# Patient Record
Sex: Female | Born: 1935 | Race: Black or African American | Hispanic: No | Smoking: Former smoker
Health system: Southern US, Community
[De-identification: ages and names within clinical notes are randomized; demographics above are authoritative.]

## PROBLEM LIST (undated history)

## (undated) DIAGNOSIS — N390 Urinary tract infection, site not specified: Secondary | ICD-10-CM

## (undated) DIAGNOSIS — Z8719 Personal history of other diseases of the digestive system: Secondary | ICD-10-CM

## (undated) DIAGNOSIS — I10 Essential (primary) hypertension: Secondary | ICD-10-CM

## (undated) DIAGNOSIS — F419 Anxiety disorder, unspecified: Secondary | ICD-10-CM

## (undated) DIAGNOSIS — K589 Irritable bowel syndrome without diarrhea: Secondary | ICD-10-CM

## (undated) DIAGNOSIS — K219 Gastro-esophageal reflux disease without esophagitis: Secondary | ICD-10-CM

## (undated) DIAGNOSIS — R0789 Other chest pain: Secondary | ICD-10-CM

## (undated) DIAGNOSIS — E785 Hyperlipidemia, unspecified: Secondary | ICD-10-CM

## (undated) DIAGNOSIS — E119 Type 2 diabetes mellitus without complications: Secondary | ICD-10-CM

## (undated) DIAGNOSIS — R059 Cough, unspecified: Secondary | ICD-10-CM

## (undated) DIAGNOSIS — I5032 Chronic diastolic (congestive) heart failure: Secondary | ICD-10-CM

## (undated) DIAGNOSIS — D509 Iron deficiency anemia, unspecified: Secondary | ICD-10-CM

## (undated) DIAGNOSIS — N289 Disorder of kidney and ureter, unspecified: Secondary | ICD-10-CM

## (undated) DIAGNOSIS — M810 Age-related osteoporosis without current pathological fracture: Secondary | ICD-10-CM

## (undated) DIAGNOSIS — R05 Cough: Secondary | ICD-10-CM

## (undated) HISTORY — DX: Iron deficiency anemia, unspecified: D50.9

## (undated) HISTORY — DX: Type 2 diabetes mellitus without complications: E11.9

## (undated) HISTORY — DX: Personal history of other diseases of the digestive system: Z87.19

## (undated) HISTORY — DX: Gastro-esophageal reflux disease without esophagitis: K21.9

## (undated) HISTORY — DX: Essential (primary) hypertension: I10

## (undated) HISTORY — DX: Chronic diastolic (congestive) heart failure: I50.32

## (undated) HISTORY — DX: Age-related osteoporosis without current pathological fracture: M81.0

## (undated) HISTORY — DX: Irritable bowel syndrome, unspecified: K58.9

## (undated) HISTORY — DX: Anxiety disorder, unspecified: F41.9

## (undated) HISTORY — DX: Cough: R05

## (undated) HISTORY — DX: Urinary tract infection, site not specified: N39.0

## (undated) HISTORY — DX: Hyperlipidemia, unspecified: E78.5

## (undated) HISTORY — DX: Cough, unspecified: R05.9

## (undated) HISTORY — DX: Other chest pain: R07.89

## (undated) HISTORY — DX: Disorder of kidney and ureter, unspecified: N28.9

---

## 1997-03-20 LAB — HM COLONOSCOPY

## 1999-08-28 ENCOUNTER — Emergency Department (HOSPITAL_COMMUNITY): Admission: EM | Admit: 1999-08-28 | Discharge: 1999-08-28 | Payer: Self-pay | Admitting: Emergency Medicine

## 1999-08-28 ENCOUNTER — Encounter: Payer: Self-pay | Admitting: Emergency Medicine

## 2000-09-18 ENCOUNTER — Encounter: Payer: Self-pay | Admitting: Endocrinology

## 2000-09-18 ENCOUNTER — Encounter: Payer: Self-pay | Admitting: Emergency Medicine

## 2000-09-18 ENCOUNTER — Inpatient Hospital Stay (HOSPITAL_COMMUNITY): Admission: EM | Admit: 2000-09-18 | Discharge: 2000-09-20 | Payer: Self-pay | Admitting: Emergency Medicine

## 2000-09-19 ENCOUNTER — Encounter: Payer: Self-pay | Admitting: Endocrinology

## 2001-04-25 ENCOUNTER — Emergency Department (HOSPITAL_COMMUNITY): Admission: EM | Admit: 2001-04-25 | Discharge: 2001-04-25 | Payer: Self-pay | Admitting: Emergency Medicine

## 2001-04-27 ENCOUNTER — Encounter: Payer: Self-pay | Admitting: Internal Medicine

## 2001-04-27 ENCOUNTER — Inpatient Hospital Stay (HOSPITAL_COMMUNITY): Admission: EM | Admit: 2001-04-27 | Discharge: 2001-05-06 | Payer: Self-pay | Admitting: Internal Medicine

## 2001-04-29 ENCOUNTER — Encounter: Payer: Self-pay | Admitting: Internal Medicine

## 2001-04-30 ENCOUNTER — Encounter: Payer: Self-pay | Admitting: Internal Medicine

## 2001-05-01 ENCOUNTER — Encounter: Payer: Self-pay | Admitting: Internal Medicine

## 2001-06-14 ENCOUNTER — Encounter (INDEPENDENT_AMBULATORY_CARE_PROVIDER_SITE_OTHER): Payer: Self-pay

## 2001-06-14 ENCOUNTER — Ambulatory Visit (HOSPITAL_COMMUNITY): Admission: RE | Admit: 2001-06-14 | Discharge: 2001-06-14 | Payer: Self-pay | Admitting: Gastroenterology

## 2001-12-26 ENCOUNTER — Encounter: Payer: Self-pay | Admitting: Gastroenterology

## 2001-12-26 ENCOUNTER — Ambulatory Visit (HOSPITAL_COMMUNITY): Admission: RE | Admit: 2001-12-26 | Discharge: 2001-12-26 | Payer: Self-pay | Admitting: Gastroenterology

## 2001-12-26 HISTORY — PX: ESOPHAGOGASTRODUODENOSCOPY: SHX1529

## 2002-02-07 ENCOUNTER — Ambulatory Visit (HOSPITAL_COMMUNITY): Admission: RE | Admit: 2002-02-07 | Discharge: 2002-02-07 | Payer: Self-pay | Admitting: Endocrinology

## 2002-02-07 ENCOUNTER — Encounter: Payer: Self-pay | Admitting: Endocrinology

## 2002-03-25 ENCOUNTER — Other Ambulatory Visit: Admission: RE | Admit: 2002-03-25 | Discharge: 2002-03-25 | Payer: Self-pay | Admitting: Family Medicine

## 2002-06-19 ENCOUNTER — Encounter: Admission: RE | Admit: 2002-06-19 | Discharge: 2002-06-19 | Payer: Self-pay | Admitting: Endocrinology

## 2002-06-19 ENCOUNTER — Encounter: Payer: Self-pay | Admitting: Endocrinology

## 2002-07-07 ENCOUNTER — Encounter: Admission: RE | Admit: 2002-07-07 | Discharge: 2002-10-05 | Payer: Self-pay | Admitting: Orthopedic Surgery

## 2002-08-15 ENCOUNTER — Encounter (INDEPENDENT_AMBULATORY_CARE_PROVIDER_SITE_OTHER): Payer: Self-pay | Admitting: Specialist

## 2002-08-15 ENCOUNTER — Ambulatory Visit (HOSPITAL_COMMUNITY): Admission: RE | Admit: 2002-08-15 | Discharge: 2002-08-15 | Payer: Self-pay | Admitting: Gastroenterology

## 2002-10-06 ENCOUNTER — Encounter: Admission: RE | Admit: 2002-10-06 | Discharge: 2002-11-07 | Payer: Self-pay | Admitting: Orthopedic Surgery

## 2004-02-25 HISTORY — PX: CARDIOVASCULAR STRESS TEST: SHX262

## 2004-12-14 ENCOUNTER — Ambulatory Visit: Payer: Self-pay | Admitting: Endocrinology

## 2005-02-02 ENCOUNTER — Ambulatory Visit: Payer: Self-pay | Admitting: Endocrinology

## 2005-03-16 ENCOUNTER — Ambulatory Visit: Payer: Self-pay | Admitting: Endocrinology

## 2005-06-16 ENCOUNTER — Ambulatory Visit: Payer: Self-pay | Admitting: Endocrinology

## 2005-08-03 ENCOUNTER — Ambulatory Visit: Payer: Self-pay | Admitting: Endocrinology

## 2006-07-13 ENCOUNTER — Ambulatory Visit: Payer: Self-pay | Admitting: Endocrinology

## 2006-07-31 ENCOUNTER — Ambulatory Visit: Payer: Self-pay | Admitting: Endocrinology

## 2006-08-07 ENCOUNTER — Ambulatory Visit: Payer: Self-pay | Admitting: Endocrinology

## 2006-08-07 LAB — CONVERTED CEMR LAB
Alkaline Phosphatase: 63 units/L (ref 39–117)
Bilirubin, Direct: 0.1 mg/dL (ref 0.0–0.3)
Cholesterol: 163 mg/dL (ref 0–200)
HDL: 38.4 mg/dL — ABNORMAL LOW (ref >39.0)
Microalb Creat Ratio: 1.5 mg/g (ref 0.0–30.0)
Microalb, Ur: 0.2 mg/dL (ref 0.0–1.9)
Triglyceride fasting, serum: 83 mg/dL (ref 0–149)
VLDL: 17 mg/dL (ref 0–40)

## 2007-04-02 LAB — CONVERTED CEMR LAB: Pap Smear: NORMAL

## 2007-05-27 ENCOUNTER — Encounter: Payer: Self-pay | Admitting: Endocrinology

## 2007-05-27 DIAGNOSIS — J302 Other seasonal allergic rhinitis: Secondary | ICD-10-CM

## 2007-05-27 DIAGNOSIS — M810 Age-related osteoporosis without current pathological fracture: Secondary | ICD-10-CM

## 2007-05-27 DIAGNOSIS — Z8719 Personal history of other diseases of the digestive system: Secondary | ICD-10-CM

## 2007-05-27 DIAGNOSIS — I10 Essential (primary) hypertension: Secondary | ICD-10-CM

## 2007-05-27 DIAGNOSIS — D509 Iron deficiency anemia, unspecified: Secondary | ICD-10-CM

## 2007-05-27 DIAGNOSIS — J45991 Cough variant asthma: Secondary | ICD-10-CM

## 2007-05-27 DIAGNOSIS — F411 Generalized anxiety disorder: Secondary | ICD-10-CM | POA: Insufficient documentation

## 2007-05-27 DIAGNOSIS — E785 Hyperlipidemia, unspecified: Secondary | ICD-10-CM

## 2007-06-04 ENCOUNTER — Ambulatory Visit: Payer: Self-pay | Admitting: Endocrinology

## 2007-07-12 ENCOUNTER — Ambulatory Visit: Payer: Self-pay | Admitting: Endocrinology

## 2007-07-30 ENCOUNTER — Ambulatory Visit: Payer: Self-pay | Admitting: Endocrinology

## 2007-07-30 LAB — CONVERTED CEMR LAB
ALT: 17 units/L (ref 0–35)
AST: 17 units/L (ref 0–37)
Albumin: 3.9 g/dL (ref 3.5–5.2)
Alkaline Phosphatase: 66 units/L (ref 39–117)
Basophils Absolute: 0 10*3/uL (ref 0.0–0.1)
Basophils Relative: 0.2 % (ref 0.0–1.0)
Bilirubin, Direct: 0.1 mg/dL (ref 0.0–0.3)
Calcium: 9.8 mg/dL (ref 8.4–10.5)
Creatinine,U: 101.7 mg/dL
Eosinophils Absolute: 0.3 10*3/uL (ref 0.0–0.6)
Glucose, Bld: 115 mg/dL — ABNORMAL HIGH (ref 70–99)
Hemoglobin, Urine: NEGATIVE
Hemoglobin: 11.1 g/dL — ABNORMAL LOW (ref 12.0–15.0)
Hgb A1c MFr Bld: 4.8 % (ref 4.6–6.0)
Ketones, ur: NEGATIVE mg/dL
Microalb, Ur: 0.3 mg/dL (ref 0.0–1.9)
Monocytes Relative: 7.5 % (ref 3.0–11.0)
Mucus, UA: NEGATIVE
Neutrophils Relative %: 54.4 % (ref 43.0–77.0)
Platelets: 177 10*3/uL (ref 150–400)
RBC / HPF: NONE SEEN
RBC: 4.12 M/uL (ref 3.87–5.11)
Sodium: 148 meq/L — ABNORMAL HIGH (ref 135–145)
Specific Gravity, Urine: <=1.005 (ref 1.000–1.03)
TSH: 1.59 microintl units/mL (ref 0.35–5.50)
Total Bilirubin: 0.8 mg/dL (ref 0.3–1.2)
Total Protein, Urine: NEGATIVE mg/dL
Total Protein: 7.3 g/dL (ref 6.0–8.3)
Triglycerides: 126 mg/dL (ref 0–149)
Urine Glucose: NEGATIVE mg/dL
VLDL: 25 mg/dL (ref 0–40)
WBC: 6.7 10*3/uL (ref 4.5–10.5)

## 2007-09-28 ENCOUNTER — Ambulatory Visit: Payer: Self-pay | Admitting: Family Medicine

## 2007-09-28 DIAGNOSIS — N390 Urinary tract infection, site not specified: Secondary | ICD-10-CM

## 2007-09-28 LAB — CONVERTED CEMR LAB
Blood in Urine, dipstick: NEGATIVE
Ketones, urine, test strip: NEGATIVE
Nitrite: NEGATIVE
Specific Gravity, Urine: <1.005
pH: 6

## 2007-09-29 ENCOUNTER — Ambulatory Visit: Payer: Self-pay | Admitting: Internal Medicine

## 2007-09-29 ENCOUNTER — Inpatient Hospital Stay (HOSPITAL_COMMUNITY): Admission: EM | Admit: 2007-09-29 | Discharge: 2007-10-03 | Payer: Self-pay | Admitting: Emergency Medicine

## 2007-10-15 ENCOUNTER — Ambulatory Visit: Payer: Self-pay | Admitting: Endocrinology

## 2007-11-06 ENCOUNTER — Encounter: Payer: Self-pay | Admitting: Endocrinology

## 2007-11-12 ENCOUNTER — Ambulatory Visit: Payer: Self-pay | Admitting: Endocrinology

## 2007-11-12 LAB — CONVERTED CEMR LAB: Hgb A1c MFr Bld: 5.8 % (ref 4.6–6.0)

## 2007-11-18 ENCOUNTER — Telehealth: Payer: Self-pay | Admitting: Endocrinology

## 2007-11-22 ENCOUNTER — Ambulatory Visit: Payer: Self-pay | Admitting: Endocrinology

## 2007-11-22 DIAGNOSIS — R059 Cough, unspecified: Secondary | ICD-10-CM | POA: Insufficient documentation

## 2007-11-22 DIAGNOSIS — R05 Cough: Secondary | ICD-10-CM | POA: Insufficient documentation

## 2007-11-26 ENCOUNTER — Telehealth (INDEPENDENT_AMBULATORY_CARE_PROVIDER_SITE_OTHER): Payer: Self-pay | Admitting: *Deleted

## 2007-11-27 ENCOUNTER — Ambulatory Visit: Payer: Self-pay | Admitting: Endocrinology

## 2007-11-28 ENCOUNTER — Telehealth (INDEPENDENT_AMBULATORY_CARE_PROVIDER_SITE_OTHER): Payer: Self-pay | Admitting: *Deleted

## 2007-11-29 ENCOUNTER — Encounter: Payer: Self-pay | Admitting: Endocrinology

## 2007-12-04 ENCOUNTER — Telehealth: Payer: Self-pay | Admitting: Endocrinology

## 2007-12-05 ENCOUNTER — Ambulatory Visit: Payer: Self-pay | Admitting: Endocrinology

## 2007-12-12 ENCOUNTER — Ambulatory Visit: Payer: Self-pay | Admitting: Endocrinology

## 2007-12-18 ENCOUNTER — Encounter: Payer: Self-pay | Admitting: Endocrinology

## 2007-12-18 ENCOUNTER — Ambulatory Visit: Payer: Self-pay

## 2007-12-24 ENCOUNTER — Telehealth (INDEPENDENT_AMBULATORY_CARE_PROVIDER_SITE_OTHER): Payer: Self-pay | Admitting: *Deleted

## 2008-01-02 ENCOUNTER — Telehealth (INDEPENDENT_AMBULATORY_CARE_PROVIDER_SITE_OTHER): Payer: Self-pay | Admitting: *Deleted

## 2008-01-06 ENCOUNTER — Ambulatory Visit: Payer: Self-pay | Admitting: Internal Medicine

## 2008-01-07 ENCOUNTER — Telehealth: Payer: Self-pay | Admitting: Endocrinology

## 2008-01-13 ENCOUNTER — Ambulatory Visit: Payer: Self-pay | Admitting: Internal Medicine

## 2008-01-13 DIAGNOSIS — K219 Gastro-esophageal reflux disease without esophagitis: Secondary | ICD-10-CM | POA: Insufficient documentation

## 2008-01-22 ENCOUNTER — Telehealth (INDEPENDENT_AMBULATORY_CARE_PROVIDER_SITE_OTHER): Payer: Self-pay | Admitting: *Deleted

## 2008-01-27 ENCOUNTER — Ambulatory Visit: Payer: Self-pay | Admitting: Endocrinology

## 2008-01-27 DIAGNOSIS — R928 Other abnormal and inconclusive findings on diagnostic imaging of breast: Secondary | ICD-10-CM | POA: Insufficient documentation

## 2008-01-27 DIAGNOSIS — K5792 Diverticulitis of intestine, part unspecified, without perforation or abscess without bleeding: Secondary | ICD-10-CM

## 2008-01-27 DIAGNOSIS — K649 Unspecified hemorrhoids: Secondary | ICD-10-CM | POA: Insufficient documentation

## 2008-01-28 LAB — CONVERTED CEMR LAB
AST: 23 units/L (ref 0–37)
Amylase: 143 units/L — ABNORMAL HIGH (ref 27–131)
Basophils Absolute: 0 10*3/uL (ref 0.0–0.1)
Bilirubin, Direct: 0.1 mg/dL (ref 0.0–0.3)
CO2: 28 meq/L (ref 19–32)
Calcium: 8.9 mg/dL (ref 8.4–10.5)
Chloride: 107 meq/L (ref 96–112)
GFR calc Af Amer: 63 mL/min
GFR calc non Af Amer: 52 mL/min
Glucose, Bld: 111 mg/dL — ABNORMAL HIGH (ref 70–99)
Ketones, ur: NEGATIVE mg/dL
MCV: 82.6 fL (ref 78.0–100.0)
Monocytes Absolute: 0.5 10*3/uL (ref 0.1–1.0)
Monocytes Relative: 8.1 % (ref 3.0–12.0)
Mucus, UA: NEGATIVE
Neutro Abs: 3.8 10*3/uL (ref 1.4–7.7)
Potassium: 3.4 meq/L — ABNORMAL LOW (ref 3.5–5.1)
RBC: 3.94 M/uL (ref 3.87–5.11)
RDW: 16.5 % — ABNORMAL HIGH (ref 11.5–14.6)
Sodium: 145 meq/L (ref 135–145)
Specific Gravity, Urine: 1.01 (ref 1.000–1.03)
Urine Glucose: NEGATIVE mg/dL
WBC, UA: NONE SEEN cells/hpf
pH: 6.5 (ref 5.0–8.0)

## 2008-01-30 ENCOUNTER — Ambulatory Visit: Payer: Self-pay | Admitting: Internal Medicine

## 2008-02-03 ENCOUNTER — Telehealth: Payer: Self-pay | Admitting: Endocrinology

## 2008-02-07 ENCOUNTER — Encounter: Admission: RE | Admit: 2008-02-07 | Discharge: 2008-02-07 | Payer: Self-pay | Admitting: Endocrinology

## 2008-02-12 ENCOUNTER — Encounter: Payer: Self-pay | Admitting: Endocrinology

## 2008-02-13 ENCOUNTER — Ambulatory Visit: Payer: Self-pay | Admitting: Internal Medicine

## 2008-03-18 ENCOUNTER — Ambulatory Visit: Payer: Self-pay | Admitting: Internal Medicine

## 2008-03-24 ENCOUNTER — Encounter: Payer: Self-pay | Admitting: Endocrinology

## 2008-03-31 ENCOUNTER — Telehealth (INDEPENDENT_AMBULATORY_CARE_PROVIDER_SITE_OTHER): Payer: Self-pay | Admitting: *Deleted

## 2008-04-02 ENCOUNTER — Encounter: Payer: Self-pay | Admitting: Endocrinology

## 2008-04-17 ENCOUNTER — Ambulatory Visit: Payer: Self-pay | Admitting: Internal Medicine

## 2008-04-20 ENCOUNTER — Encounter: Payer: Self-pay | Admitting: Endocrinology

## 2008-07-01 ENCOUNTER — Ambulatory Visit: Payer: Self-pay | Admitting: Internal Medicine

## 2008-07-10 ENCOUNTER — Ambulatory Visit: Payer: Self-pay | Admitting: Endocrinology

## 2008-07-13 ENCOUNTER — Telehealth (INDEPENDENT_AMBULATORY_CARE_PROVIDER_SITE_OTHER): Payer: Self-pay | Admitting: *Deleted

## 2008-07-13 LAB — CONVERTED CEMR LAB
Eosinophils Absolute: 0.2 10*3/uL (ref 0.0–0.7)
Eosinophils Relative: 2.8 % (ref 0.0–5.0)
HCT: 37.1 % (ref 36.0–46.0)
Hemoglobin: 12.6 g/dL (ref 12.0–15.0)
Lymphocytes Relative: 21.5 % (ref 12.0–46.0)
MCHC: 33.9 g/dL (ref 30.0–36.0)
Monocytes Absolute: 0.7 10*3/uL (ref 0.1–1.0)
Neutrophils Relative %: 67.2 % (ref 43.0–77.0)
Platelets: 209 10*3/uL (ref 150–400)
RBC: 4.52 M/uL (ref 3.87–5.11)
RDW: 16.3 % — ABNORMAL HIGH (ref 11.5–14.6)

## 2008-07-14 ENCOUNTER — Encounter: Payer: Self-pay | Admitting: Endocrinology

## 2008-07-15 ENCOUNTER — Encounter: Admission: RE | Admit: 2008-07-15 | Discharge: 2008-07-15 | Payer: Self-pay | Admitting: Gastroenterology

## 2008-07-22 ENCOUNTER — Ambulatory Visit: Payer: Self-pay | Admitting: Endocrinology

## 2008-07-22 DIAGNOSIS — R1909 Other intra-abdominal and pelvic swelling, mass and lump: Secondary | ICD-10-CM

## 2008-08-13 ENCOUNTER — Ambulatory Visit: Payer: Self-pay | Admitting: Internal Medicine

## 2008-08-14 ENCOUNTER — Ambulatory Visit: Admission: RE | Admit: 2008-08-14 | Discharge: 2008-08-14 | Payer: Self-pay | Admitting: Gynecology

## 2008-08-28 ENCOUNTER — Telehealth: Payer: Self-pay | Admitting: Endocrinology

## 2008-09-01 ENCOUNTER — Ambulatory Visit (HOSPITAL_COMMUNITY): Admission: RE | Admit: 2008-09-01 | Discharge: 2008-09-01 | Payer: Self-pay | Admitting: Gynecologic Oncology

## 2008-09-01 ENCOUNTER — Encounter (INDEPENDENT_AMBULATORY_CARE_PROVIDER_SITE_OTHER): Payer: Self-pay | Admitting: Gynecologic Oncology

## 2008-10-02 HISTORY — PX: RIGHT OOPHORECTOMY: SHX2359

## 2008-10-05 ENCOUNTER — Telehealth: Payer: Self-pay | Admitting: Endocrinology

## 2008-10-08 ENCOUNTER — Ambulatory Visit: Admission: RE | Admit: 2008-10-08 | Discharge: 2008-10-08 | Payer: Self-pay | Admitting: Gynecologic Oncology

## 2008-12-09 ENCOUNTER — Ambulatory Visit: Payer: Self-pay | Admitting: Endocrinology

## 2009-01-20 ENCOUNTER — Ambulatory Visit: Payer: Self-pay | Admitting: Endocrinology

## 2009-01-20 LAB — CONVERTED CEMR LAB
ALT: 17 units/L (ref 0–35)
Basophils Absolute: 0.1 10*3/uL (ref 0.0–0.1)
Basophils Relative: 0.9 % (ref 0.0–3.0)
Bilirubin Urine: NEGATIVE
Creatinine, Ser: 1.2 mg/dL (ref 0.4–1.2)
Creatinine,U: 136.7 mg/dL
Eosinophils Absolute: 0.3 10*3/uL (ref 0.0–0.7)
HDL: 38.3 mg/dL — ABNORMAL LOW (ref >39.00)
Hemoglobin, Urine: NEGATIVE
Hgb A1c MFr Bld: 5.5 % (ref 4.6–6.5)
Iron: 72 ug/dL (ref 42–145)
Ketones, ur: NEGATIVE mg/dL
Leukocytes, UA: NEGATIVE
Lymphs Abs: 1.8 10*3/uL (ref 0.7–4.0)
MCHC: 33.1 g/dL (ref 30.0–36.0)
MCV: 81.3 fL (ref 78.0–100.0)
Microalb Creat Ratio: 1.5 mg/g (ref 0.0–30.0)
Microalb, Ur: 0.2 mg/dL (ref 0.0–1.9)
Neutro Abs: 3.3 10*3/uL (ref 1.4–7.7)
Nitrite: NEGATIVE
Platelets: 163 10*3/uL (ref 150.0–400.0)
RBC: 3.85 M/uL — ABNORMAL LOW (ref 3.87–5.11)
Specific Gravity, Urine: 1.01 (ref 1.000–1.030)
TSH: 1.74 microintl units/mL (ref 0.35–5.50)
Transferrin: 253.5 mg/dL (ref 212.0–360.0)
Triglycerides: 128 mg/dL (ref 0.0–149.0)
Urine Glucose: NEGATIVE mg/dL
VLDL: 25.6 mg/dL (ref 0.0–40.0)
WBC: 6.1 10*3/uL (ref 4.5–10.5)
pH: 6 (ref 5.0–8.0)

## 2009-01-26 ENCOUNTER — Ambulatory Visit: Payer: Self-pay | Admitting: Internal Medicine

## 2009-01-26 ENCOUNTER — Encounter: Payer: Self-pay | Admitting: Endocrinology

## 2009-03-11 ENCOUNTER — Telehealth: Payer: Self-pay | Admitting: Endocrinology

## 2009-03-11 ENCOUNTER — Ambulatory Visit: Payer: Self-pay | Admitting: Endocrinology

## 2009-03-11 ENCOUNTER — Encounter: Admission: RE | Admit: 2009-03-11 | Discharge: 2009-03-11 | Payer: Self-pay | Admitting: Endocrinology

## 2009-03-12 ENCOUNTER — Telehealth: Payer: Self-pay | Admitting: Endocrinology

## 2009-04-21 ENCOUNTER — Ambulatory Visit: Payer: Self-pay | Admitting: Internal Medicine

## 2009-04-26 LAB — CONVERTED CEMR LAB
Alkaline Phosphatase: 80 units/L (ref 39–117)
Bilirubin, Direct: 0.2 mg/dL (ref 0.0–0.3)
Cholesterol: 124 mg/dL (ref 0–200)
Hgb A1c MFr Bld: 5.6 % (ref 4.6–6.5)
LDL Cholesterol: 61 mg/dL (ref 0–99)
Total Bilirubin: 1 mg/dL (ref 0.3–1.2)
Total CHOL/HDL Ratio: 3
Total Protein: 7.3 g/dL (ref 6.0–8.3)

## 2009-04-30 ENCOUNTER — Telehealth (INDEPENDENT_AMBULATORY_CARE_PROVIDER_SITE_OTHER): Payer: Self-pay | Admitting: *Deleted

## 2009-05-03 ENCOUNTER — Telehealth (INDEPENDENT_AMBULATORY_CARE_PROVIDER_SITE_OTHER): Payer: Self-pay | Admitting: *Deleted

## 2009-05-07 ENCOUNTER — Ambulatory Visit: Payer: Self-pay | Admitting: Internal Medicine

## 2009-05-07 DIAGNOSIS — R079 Chest pain, unspecified: Secondary | ICD-10-CM

## 2009-05-07 DIAGNOSIS — K589 Irritable bowel syndrome without diarrhea: Secondary | ICD-10-CM

## 2009-05-11 ENCOUNTER — Ambulatory Visit: Payer: Self-pay | Admitting: Endocrinology

## 2009-05-28 ENCOUNTER — Encounter: Admission: RE | Admit: 2009-05-28 | Discharge: 2009-05-28 | Payer: Self-pay | Admitting: Endocrinology

## 2009-07-02 ENCOUNTER — Ambulatory Visit: Payer: Self-pay | Admitting: Endocrinology

## 2009-08-23 ENCOUNTER — Ambulatory Visit: Payer: Self-pay | Admitting: Endocrinology

## 2009-09-22 ENCOUNTER — Ambulatory Visit: Payer: Self-pay | Admitting: Endocrinology

## 2009-09-29 ENCOUNTER — Ambulatory Visit: Payer: Self-pay | Admitting: Endocrinology

## 2009-10-04 ENCOUNTER — Ambulatory Visit: Payer: Self-pay | Admitting: Endocrinology

## 2009-11-09 ENCOUNTER — Ambulatory Visit: Payer: Self-pay | Admitting: Endocrinology

## 2009-11-09 DIAGNOSIS — R930 Abnormal findings on diagnostic imaging of skull and head, not elsewhere classified: Secondary | ICD-10-CM

## 2009-11-23 ENCOUNTER — Ambulatory Visit: Payer: Self-pay | Admitting: Internal Medicine

## 2009-12-22 ENCOUNTER — Ambulatory Visit: Payer: Self-pay | Admitting: Endocrinology

## 2009-12-22 LAB — CONVERTED CEMR LAB
ALT: 20 units/L (ref 0–35)
Albumin: 3.9 g/dL (ref 3.5–5.2)
Bilirubin, Direct: 0.2 mg/dL (ref 0.0–0.3)
Cholesterol: 103 mg/dL (ref 0–200)
Creatinine,U: 85.4 mg/dL
Eosinophils Absolute: 0.4 10*3/uL (ref 0.0–0.7)
GFR calc non Af Amer: 56.85 mL/min (ref >60)
HCT: 35.5 % — ABNORMAL LOW (ref 36.0–46.0)
HDL: 38.6 mg/dL — ABNORMAL LOW (ref >39.00)
Hemoglobin: 11.3 g/dL — ABNORMAL LOW (ref 12.0–15.0)
Hgb A1c MFr Bld: 5.6 % (ref 4.6–6.5)
Iron: 74 ug/dL (ref 42–145)
Ketones, ur: NEGATIVE mg/dL
LDL Cholesterol: 40 mg/dL (ref 0–99)
Lymphs Abs: 1.5 10*3/uL (ref 0.7–4.0)
MCHC: 31.9 g/dL (ref 30.0–36.0)
MCV: 81.9 fL (ref 78.0–100.0)
Monocytes Absolute: 0.3 10*3/uL (ref 0.1–1.0)
Monocytes Relative: 6.2 % (ref 3.0–12.0)
Neutrophils Relative %: 56.9 % (ref 43.0–77.0)
Platelets: 144 10*3/uL — ABNORMAL LOW (ref 150.0–400.0)
Potassium: 3.7 meq/L (ref 3.5–5.1)
RDW: 16.1 % — ABNORMAL HIGH (ref 11.5–14.6)
Rhuematoid fact SerPl-aCnc: 21.5 intl units/mL — ABNORMAL HIGH (ref 0.0–20.0)
Saturation Ratios: 20.1 % (ref 20.0–50.0)
Sed Rate: 3 mm/hr (ref 0–22)
Sodium: 141 meq/L (ref 135–145)
Specific Gravity, Urine: 1.015 (ref 1.000–1.030)
TSH: 2.51 microintl units/mL (ref 0.35–5.50)
Total Bilirubin: 0.6 mg/dL (ref 0.3–1.2)
Total Protein, Urine: NEGATIVE mg/dL
Triglycerides: 120 mg/dL (ref 0.0–149.0)
Urine Glucose: NEGATIVE mg/dL
Urobilinogen, UA: 0.2 (ref 0.0–1.0)
WBC: 5.6 10*3/uL (ref 4.5–10.5)
pH: 6 (ref 5.0–8.0)

## 2009-12-24 ENCOUNTER — Telehealth: Payer: Self-pay | Admitting: Endocrinology

## 2010-01-04 ENCOUNTER — Ambulatory Visit: Payer: Self-pay | Admitting: Internal Medicine

## 2010-04-13 ENCOUNTER — Ambulatory Visit: Payer: Self-pay | Admitting: Endocrinology

## 2010-04-14 ENCOUNTER — Telehealth: Payer: Self-pay | Admitting: Endocrinology

## 2010-04-28 ENCOUNTER — Telehealth: Payer: Self-pay | Admitting: Endocrinology

## 2010-05-03 ENCOUNTER — Encounter: Payer: Self-pay | Admitting: Endocrinology

## 2010-06-14 ENCOUNTER — Encounter: Admission: RE | Admit: 2010-06-14 | Discharge: 2010-06-14 | Payer: Self-pay | Admitting: Endocrinology

## 2010-07-13 ENCOUNTER — Ambulatory Visit: Payer: Self-pay | Admitting: Endocrinology

## 2010-07-13 ENCOUNTER — Encounter: Payer: Self-pay | Admitting: Endocrinology

## 2010-07-13 LAB — CONVERTED CEMR LAB
Basophils Absolute: 0 10*3/uL (ref 0.0–0.1)
Eosinophils Absolute: 0.5 10*3/uL (ref 0.0–0.7)
Eosinophils Relative: 6.5 % — ABNORMAL HIGH (ref 0.0–5.0)
HCT: 33.8 % — ABNORMAL LOW (ref 36.0–46.0)
Hgb A1c MFr Bld: 6.5 % (ref 4.6–6.5)
Iron: 82 ug/dL (ref 42–145)
Lymphocytes Relative: 24.7 % (ref 12.0–46.0)
MCHC: 34.1 g/dL (ref 30.0–36.0)
Neutro Abs: 4.8 10*3/uL (ref 1.4–7.7)
RBC: 4.13 M/uL (ref 3.87–5.11)

## 2010-10-28 ENCOUNTER — Telehealth: Payer: Self-pay | Admitting: Endocrinology

## 2010-11-01 NOTE — Assessment & Plan Note (Signed)
Summary: Pulmonary/ f/u with pft's and ext summary discussion   Copy to:  Dr. Loanne Drilling Primary Provider/Referring Provider:  Loanne Drilling  CC:  6 wk followup with PFT's.  Pt states that her breathing has improved since last seen.  She still has occ SOB with exertion "if walks a long way".  She c/o "hacking cough" that she relates to having allergies..  History of Present Illness:  75 yobf quit smoking 1988  with h/o  asthma who had  been using Advair on a p.r.n. basis and noticing increasing dyspnea and need for rescue therapy x one mo when seen 01/06/08 for pulmonary evaluation so  Advair stopped. Began Symbicort 160/4.38mcg 2 puffs two times a day.   Returned 02/13/08  improved with decreased dyspnea and no rescue use   Returned  03/18/08 symptom free no rescue needed, stopped reglan, no flare  April 17, 2008 did not use am symbicort but no symptoms or need for rescue, asked her to use symbicort consistently  July 01, 2008 ov reports everything better until 9/28 increaese cough/ sneeze and watery discharge,   rx predisone 10  4 each am x 2days, 2x2days, 1x2days and stop   August 13, 2008 ov co all better, did not lose ground off predsione in terms of cough or sob.   November 23, 2009 Followup.  Pt states that she had PNA in Jan 2011 and since then has had increased SOB.  She states that SOB is only with exertion.  She gets SOB walking long distances.  She also c/o dry cough x several months.  not compliant with symbicort. rec symbicort 160 and work on techniqu  January 04, 2010  cc  her breathing has improved since last seen.  She still has occ SOB with exertion "if walks a long way".  She c/o "hacking cough" that she relates to having allergies tends to be worse in spring day > night mild itching sneezing runny nose.  Pt denies any significant sore throat, dysphagia, itching, sneezing,  fever, chills, sweats, unintended wt loss, pleuritic or exertional cp, hempoptysis, change in activity  tolerance  orthopnea pnd or leg swelling. Pt also denies any obvious fluctuation in symptoms with weather or environmental change or other alleviating or aggravating factors.    Pt denies any increase in rescue therapy over baseline, denies waking up needing it or having early am exacerbations of coughing/wheezing/ or dyspnea   Current Medications (verified): 1)  Symbicort 160-4.5 Mcg/act Aero (Budesonide-Formoterol Fumarate) .... 2 Puffs First Thing  in Am and 2 Puffs Again in Pm About 12 Hours Later 2)  Singulair 10 Mg  Tabs (Montelukast Sodium) .... Take 1 Tablet By Mouth Once A Day 3)  Norvasc 10 Mg  Tabs (Amlodipine Besylate) .... One Half Daily 4)  Novolog 100 Unit/ml  Soln (Insulin Aspart) .... Three Times A Day (Before Every Meal) 25-20-40 Units. 5)  Ventolin Hfa 108 (90 Base) Mcg/act  Aers (Albuterol Sulfate) .Marland Kitchen.. 1-2 Puffs Every 4-6 Hours As Needed For Increase Cough/wheeze or Short of Breath 6)  Albuterol Sulfate (2.5 Mg/34ml) 0.083%  Nebu (Albuterol Sulfate) .Marland Kitchen.. 1 Neb Every 6 Hours As Needed For Severe Short of Breath Not Responding To Inhaler With Albuterol 7)  Onetouch Lancets   Misc (Lancets) .... Use As Directed With Meter Three Times Daily, in The Am Before Meals and 2 Hours After Lunch and 2 Hours After Dinner 8)  Onetouch Test   Strp (Glucose Blood) .... Use One Test Strip Three Times Daily  9)  Bd Insulin Syringe Ultrafine 31g X 5/16" 0.3 Ml  Misc (Insulin Syringe-Needle U-100) .... Use As Directed 10)  Mucinex Dm 30-600 Mg Xr12h-Tab (Dextromethorphan-Guaifenesin) .Marland Kitchen.. 1-2 Every 12 Hours For Cough or Congestion 11)  Pravastatin Sodium 40 Mg Tabs (Pravastatin Sodium) .Marland Kitchen.. 1 Qhs 12)  Hyzaar 50-12.5 Mg Tabs (Losartan Potassium-Hctz) .Marland Kitchen.. 1 Qd 13)  Hydrocodone-Acetaminophen 5-325 Mg Tabs (Hydrocodone-Acetaminophen) .... 1/2-1 Every 4 Hrs As Needed For Pain  Allergies (verified): 1)  ! * Altace 2)  ! * Asprin  Past History:  Past Medical History: CHEST WALL PAIN, ANTERIOR  (ICD-786.52) COUGH (ICD-786.2) UTI (ICD-599.0) OSTEOPOROSIS (ICD-733.00) HYPERTENSION (ICD-401.9) HYPERLIPIDEMIA (ICD-272.4) DIVERTICULITIS, HX OF (ICD-V12.79) DIABETES MELLITUS, TYPE II (ICD-250.00) COLON CANCER, HX OF (ICD-V10.05) ASTHMA (ICD-493.90)   -PFT's 03/18/08 minimal airflow obstruction   -PFT's January 04, 2010 No sign airflow obstruction   -Mastered HFA technique August 13, 2008 > confimed November 23, 2009  ANXIETY (ICD-300.00) ANEMIA-IRON DEFICIENCY (ICD-280.9) ALLERGIC RHINITIS (ICD-477.9)  IBS GERD  Vital Signs:  Patient profile:   75 year old female Weight:      199 pounds O2 Sat:      98 % on Room air Temp:     98.3 degrees F oral Pulse rate:   76 / minute BP sitting:   120 / 64  (left arm) Cuff size:   large  Vitals Entered ByTilden Dome (January 04, 2010 9:46 AM)  O2 Flow:  Room air  Physical Exam  Additional Exam:  wt 198 > 202 November 23, 2009> 199 January 04, 2010  HEENT mild turbinate edema.  Oropharynx no thrush or excess pnd or cobblestoning.  No JVD or cervical adenopathy. Mild accessory muscle hypertrophy. Trachea midline, nl thryroid. Chest was hyperinflated by percussion with diminished breath sounds and moderate increased exp time without wheeze. Hoover sign positive at mid inspiration. Regular rate and rhythm without murmur gallop or rub or increase P2 or edema.  Abd: no hsm, nl excursion. Ext warm without cyanosis or clubbing.     Impression & Recommendations:  Problem # 1:  ASTHMA (ICD-493.90) All goals of asthma met including optimal function and elimination of symptoms with minimum need for rescue therapy. Contingencies discussed today including the rule of two's.   Since no airflow obst and only symptom is likely uacs (see below) try symbicort 80  I spent extra time with the patient today explaining optimal mdi  technique.  This improved from  75-90%  Problem # 2:  COUGH (ICD-786.2)  Upper airway cough syndrome, so named because  it's frequently impossible to sort out how much is LPR vs CR/sinusitis with freq throat clearing generating secondary extra esophageal GERD from wide swings in gastric pressure that occur with throat clearing, promoting self use of mint and menthol lozenges that reduce the lower esophageal sphincter tone and exacerbate the problem further.  These symptoms are easily confused with asthma/copd by even experienced pulmonogists because they overlap so much.  These are the same pts who not infrequently have failed to tolerate ace inhibitors,  dry powder inhalers or biphosphonates or report having reflux symptoms that don't respond to standard doses of PPI   Rec add zyrtec or 1st gen antihistamine at this point and if not effective consider aggressive rx aimed at acid reflux next step (might also consider change cozaar to diovan since anecdotes appearing of cough from cozaar for reasons not clear to me)   Each maintenance medication was reviewed in detail including most importantly  the difference between maintenance and as needed and under what circumstances the prns are to be used. See instructions for specific recommendations   Orders: Est. Patient Level IV [id number])  Medications Added to Medication List This Visit: 1)  Symbicort 80-4.5 Mcg/act Aero (Budesonide-formoterol fumarate) .... 2 puffs first thing  in am and 2 puffs again in pm about 12 hours later  Patient Instructions: 1)  Try lower strength symbicort 80 2 puffs first thing  in am and 2 puffs again in pm about 12 hours later and fill the prescription if do as well 2)  If your breathing worsens or you need to use your rescue inhaler (ventolin or nebulized albuterol) more than twice weekly or wake up more than twice a month with any respiratory symptoms or require more than two rescue inhalers per year, we need to see you right away. 3)  for allergies try zyrtec or chlortrimeton otc as needed instead of benadryl Prescriptions: SYMBICORT 80-4.5  MCG/ACT  AERO (BUDESONIDE-FORMOTEROL FUMARATE) 2 puffs first thing  in am and 2 puffs again in pm about 12 hours later  #1 x 11   Entered and Authorized by:   Tanda Rockers MD   Signed by:   Tanda Rockers MD on 01/04/2010   Method used:   Print then Mail to Patient   RxID:   [id number]

## 2010-11-01 NOTE — Assessment & Plan Note (Signed)
Summary: LEG PAIN/NWS   Vital Signs:  Patient profile:   75 year old female Height:      67.5 inches (171.45 cm) Weight:      198.25 pounds (90.11 kg) O2 Sat:      97 % on Room air Temp:     97.2 degrees F (36.22 degrees C) oral Pulse rate:   85 / minute BP sitting:   128 / 68  (left arm) Cuff size:   large  Vitals Entered By: Gardenia Phlegm RMA (December 22, 2009 7:59 AM)  O2 Flow:  Room air CC: Right leg pain X1week- pt states her right knee is swollen/ CF Is Patient Diabetic? Yes   Referring Provider:  Dr. Loanne Drilling Primary Provider:  Loanne Drilling  CC:  Right leg pain Sharilyn Sites- pt states her right knee is swollen/ CF.  History of Present Illness: pt states 1 month of moderate right knee pain.  no injury.  no associated numbness.  pain radiates to the right calf. no cbg record, but states cbg's are highest at hs (200).  Current Medications (verified): 1)  Symbicort 160-4.5 Mcg/act Aero (Budesonide-Formoterol Fumarate) .... 2 Puffs First Thing  in Am and 2 Puffs Again in Pm About 12 Hours Later 2)  Singulair 10 Mg  Tabs (Montelukast Sodium) .... Take 1 Tablet By Mouth Once A Day 3)  Norvasc 10 Mg  Tabs (Amlodipine Besylate) .... One Half Daily 4)  Novolog 100 Unit/ml  Soln (Insulin Aspart) .... Three Times A Day (Before Every Meal) 25-20-30 Units. 5)  Ventolin Hfa 108 (90 Base) Mcg/act  Aers (Albuterol Sulfate) .Marland Kitchen.. 1-2 Puffs Every 4-6 Hours As Needed For Increase Cough/wheeze or Short of Breath 6)  Albuterol Sulfate (2.5 Mg/50ml) 0.083%  Nebu (Albuterol Sulfate) .Marland Kitchen.. 1 Neb Every 6 Hours As Needed For Severe Short of Breath Not Responding To Inhaler With Albuterol 7)  Onetouch Lancets   Misc (Lancets) .... Use As Directed With Meter Three Times Daily, in The Am Before Meals and 2 Hours After Lunch and 2 Hours After Dinner 8)  Onetouch Test   Strp (Glucose Blood) .... Use One Test Strip Three Times Daily 9)  Bd Insulin Syringe Ultrafine 31g X 5/16" 0.3 Ml  Misc (Insulin Syringe-Needle  U-100) .... Use As Directed 10)  Mucinex Dm 30-600 Mg Xr12h-Tab (Dextromethorphan-Guaifenesin) .Marland Kitchen.. 1-2 Every 12 Hours For Cough or Congestion 11)  Oxycodone Hcl 5 Mg Caps (Oxycodone Hcl) .Marland Kitchen.. 1 By Mouth Q 6 Hrs As Needed Pain 12)  Pravastatin Sodium 40 Mg Tabs (Pravastatin Sodium) .Marland Kitchen.. 1 Qhs 13)  Flexeril 5 Mg Tabs (Cyclobenzaprine Hcl) .Marland Kitchen.. 1po Three Times A Day As Needed 14)  Hyzaar 50-12.5 Mg Tabs (Losartan Potassium-Hctz) .Marland Kitchen.. 1 Qd  Allergies (verified): 1)  ! * Altace 2)  ! * Asprin  Past History:  Past Medical History: Last updated: 11/23/2009 CHEST WALL PAIN, ANTERIOR (ICD-786.52) COUGH (ICD-786.2) UTI (ICD-599.0) OSTEOPOROSIS (ICD-733.00) HYPERTENSION (ICD-401.9) HYPERLIPIDEMIA (ICD-272.4) DIVERTICULITIS, HX OF (ICD-V12.79) DIABETES MELLITUS, TYPE II (ICD-250.00) COLON CANCER, HX OF (ICD-V10.05) ASTHMA (ICD-493.90)   -PFT's 03/18/08 minimal airflow obstruction   -Mastered HFA technique August 13, 2008 > confimed November 23, 2009  ANXIETY (ICD-300.00) ANEMIA-IRON DEFICIENCY (ICD-280.9) ALLERGIC RHINITIS (ICD-477.9)  IBS GERD  Review of Systems  The patient denies hypoglycemia and fever.    Physical Exam  General:  obese.   Msk:  gait: favors right leg Extremities:  right knee is normal to my exam Additional Exam:   LDL Cholesterol  40 mg/dL     Impression & Recommendations:  Problem # 1:  KNEE PAIN, RIGHT (A999333) uncertain etiology  Problem # 2:  DIABETES MELLITUS, TYPE II (ICD-250.00) needs increased rx  Problem # 3:  HYPERLIPIDEMIA (ICD-272.4) well-controlled  Medications Added to Medication List This Visit: 1)  Novolog 100 Unit/ml Soln (Insulin aspart) .... Three times a day (before every meal) 25-20-40 units. 2)  Hydrocodone-acetaminophen 5-325 Mg Tabs (Hydrocodone-acetaminophen) .... 1/2-1 every 4 hrs as needed for pain  Other Orders: TLB-Udip w/ Micro (81001-URINE) T-Knee Right 2 view (73560TC) TLB-Lipid Panel  (80061-LIPID) TLB-BMP (Basic Metabolic Panel-BMET) (99991111) TLB-CBC Platelet - w/Differential (85025-CBCD) TLB-Hepatic/Liver Function Pnl (80076-HEPATIC) TLB-TSH (Thyroid Stimulating Hormone) (84443-TSH) TLB-A1C / Hgb A1C (Glycohemoglobin) (83036-A1C) TLB-Microalbumin/Creat Ratio, Urine (82043-MALB) TLB-Sedimentation Rate (ESR) (85652-ESR) TLB-Rheumatoid Factor (RA) (86431-RA) TLB-Uric Acid, Blood (84550-URIC) TLB-IBC Pnl (Iron/FE;Transferrin) (83550-IBC) Est. Patient Level IV [id number])  Patient Instructions: 1)  check your blood sugar 2 times a day.  vary the time of day when you check, between before the 3 meals, and at bedtime.  also check if you have symptoms of your blood sugar being too high or too low.  please keep a record of the readings and bring it to your next appointment here.  please call us sooner if you are having low blood sugar episodes. 2)  increase novolog to three times a day, (just before each meal) 25-20-40 units. 3)  you will have an ultrasound to check for a blood clot today. 4)  blood tests and x ray today 5)  hydrocodone-apap:  1/2 or 1 every 4 hrs as needed for pain. 6)  physical next month Prescriptions: HYDROCODONE-ACETAMINOPHEN 5-325 MG TABS (HYDROCODONE-ACETAMINOPHEN) 1/2-1 every 4 hrs as needed for pain  #50 x 1   Entered and Authorized by:   Donavan Foil MD   Signed by:   Donavan Foil MD on 12/22/2009   Method used:   Print then Give to Patient   RxID:   [id number]

## 2010-11-01 NOTE — Miscellaneous (Signed)
Summary: Orders Update pft charges  Clinical Lists Changes  Orders: Added new Service order of Carbon Monoxide diffusing w/capacity (94720) - Signed Added new Service order of Lung Volumes (94240) - Signed Added new Service order of Spirometry (Pre & Post) (94060) - Signed 

## 2010-11-01 NOTE — Assessment & Plan Note (Signed)
Summary: NXT WK FU D/T STC   Vital Signs:  Patient profile:   75 year old female Height:      67.5 inches Weight:      198.0 pounds BMI:     30.66 O2 Sat:      98 % on Room air Temp:     98.2 degrees F oral Pulse rate:   71 / minute BP sitting:   108 / 58  (left arm) Cuff size:   large  Vitals Entered By: Rebeca Alert (November 09, 2009 9:13 AM)  O2 Flow:  Room air CC: Follow-up visit/AJ   Referring Provider:  Dr. Loanne Drilling Primary Provider:  Loanne Drilling  CC:  Follow-up visit/AJ.  History of Present Illness: pt says she feels better overall, but still has a dry cough. no cbg record, but states cbg's vary from 60-150.  it is lowest before supper, and highest at hs. pt says her chronic doe persists.  Current Medications (verified): 1)  Symbicort 160-4.5 Mcg/act Aero (Budesonide-Formoterol Fumarate) .... Inhale 2 Puffs Two Times A Day 2)  Singulair 10 Mg  Tabs (Montelukast Sodium) .... Take 1 Tablet By Mouth Once A Day 3)  Norvasc 10 Mg  Tabs (Amlodipine Besylate) .... One Half Daily 4)  Lantus 100 Unit/ml Soln (Insulin Glargine) .... 5 Units At Bedtime 5)  Novolog 100 Unit/ml  Soln (Insulin Aspart) .... 25 Units Three Times A Day (Before Every Meal) 6)  Ventolin Hfa 108 (90 Base) Mcg/act  Aers (Albuterol Sulfate) .Marland Kitchen.. 1-2 Puffs Every 4-6 Hours As Needed For Increase Cough/wheeze or Short of Breath 7)  Albuterol Sulfate (2.5 Mg/59ml) 0.083%  Nebu (Albuterol Sulfate) .Marland Kitchen.. 1 Neb Every 6 Hours As Needed For Severe Short of Breath Not Responding To Inhaler With Albuterol 8)  Onetouch Lancets   Misc (Lancets) .... Use As Directed With Meter Three Times Daily, in The Am Before Meals and 2 Hours After Lunch and 2 Hours After Dinner 9)  Onetouch Test   Strp (Glucose Blood) .... Use One Test Strip Three Times Daily 10)  Bd Insulin Syringe Ultrafine 31g X 5/16" 0.3 Ml  Misc (Insulin Syringe-Needle U-100) .... Use As Directed 11)  Mucinex Dm 30-600 Mg Xr12h-Tab (Dextromethorphan-Guaifenesin)  .Marland Kitchen.. 1-2 Every 12 Hours For Cough or Congestion 12)  Omeprazole 40 Mg Cpdr (Omeprazole) .... Qd 13)  Oxycodone Hcl 5 Mg Caps (Oxycodone Hcl) .Marland Kitchen.. 1 By Mouth Q 6 Hrs As Needed Pain 14)  Pravastatin Sodium 40 Mg Tabs (Pravastatin Sodium) .Marland Kitchen.. 1 Qhs 15)  Flexeril 5 Mg Tabs (Cyclobenzaprine Hcl) .Marland Kitchen.. 1po Three Times A Day As Needed 16)  Hyzaar 50-12.5 Mg Tabs (Losartan Potassium-Hctz) .Marland Kitchen.. 1 Qd 17)  Cefuroxime Axetil 250 Mg Tabs (Cefuroxime Axetil) .Marland Kitchen.. 1 Bid 18)  Methylprednisolone (Pak) 4 Mg Tabs (Methylprednisolone) .... As Dir 19)  Promethazine-Dm 6.25-15 Mg/11ml Syrp (Promethazine-Dm) .Marland Kitchen.. 1 Tsp Q4h As Needed Cough  Allergies (verified): 1)  ! * Altace 2)  ! * Asprin  Past History:  Past Medical History: Last updated: 05/07/2009 CHEST WALL PAIN, ANTERIOR (ICD-786.52) COUGH (ICD-786.2) UTI (ICD-599.0) OSTEOPOROSIS (ICD-733.00) HYPERTENSION (ICD-401.9) HYPERLIPIDEMIA (ICD-272.4) DIVERTICULITIS, HX OF (ICD-V12.79) DIABETES MELLITUS, TYPE II (ICD-250.00) COLON CANCER, HX OF (ICD-V10.05) ASTHMA (ICD-493.90)   -PFT's 03/18/08 minimal airflow obstruction   -Mastered HFA technique August 13, 2008  ANXIETY (ICD-300.00) ANEMIA-IRON DEFICIENCY (ICD-280.9) ALLERGIC RHINITIS (ICD-477.9)  IBS GERD  Review of Systems  The patient denies fever and syncope.    Physical Exam  General:  obese.   Lungs:  clear  except for few expiratory wheezes.   Impression & Recommendations:  Problem # 1:  DIABETES MELLITUS, TYPE II (ICD-250.00) the pattern of cbs's says she does not need the lantus.  Problem # 2:  ASTHMA (ICD-493.90) worse recently  Medications Added to Medication List This Visit: 1)  Novolog 100 Unit/ml Soln (Insulin aspart) .... Three times a day (before every meal) 25-20-30 units.  Other Orders: Pulmonary Referral (Pulmonary) T-2 View CXR (71020TC) Est. Patient Level III [id number])  Patient Instructions: 1)  check your blood sugar 2 times a day.  vary the time of day  when you check, between before the 3 meals, and at bedtime.  also check if you have symptoms of your blood sugar being too high or too low.  please keep a record of the readings and bring it to your next appointment here.  please call us sooner if you are having low blood sugar episodes. 2)  change novolog to three times a day, (just before each meal) 25-20-30 units. 3)  stop lantus. 4)  return 3 months for physical. 5)  recheck chest x ray. 6)  i've requested an appointment for you to see dr wert again.

## 2010-11-01 NOTE — Assessment & Plan Note (Signed)
Summary: FU ON DM/NWS  #   Vital Signs:  Patient profile:   75 year old female Height:      67.5 inches (171.45 cm) Weight:      202.50 pounds (92.05 kg) BMI:     31.36 O2 Sat:      98 % on Room air Temp:     97.8 degrees F (36.56 degrees C) oral Pulse rate:   78 / minute BP sitting:   118 / 68  (left arm) Cuff size:   large  Vitals Entered By: Rebeca Alert MA (April 13, 2010 8:57 AM)  O2 Flow:  Room air CC: F/U on DM/ Refill on Norvasc/aj Is Patient Diabetic? Yes   Referring Provider:  Dr. Loanne Drilling Primary Provider:  Loanne Drilling  CC:  F/U on DM/ Refill on Norvasc/aj.  History of Present Illness: no cbg record, but states cbg's are highest before supper (often over 200).  it is lowest in am.  pt states she feels well in general, except for fatigue.    Current Medications (verified): 1)  Singulair 10 Mg  Tabs (Montelukast Sodium) .... Take 1 Tablet By Mouth Once A Day 2)  Norvasc 10 Mg  Tabs (Amlodipine Besylate) .... One Half Daily 3)  Novolog 100 Unit/ml  Soln (Insulin Aspart) .... Three Times A Day (Before Every Meal) 25-20-40 Units. 4)  Ventolin Hfa 108 (90 Base) Mcg/act  Aers (Albuterol Sulfate) .Marland Kitchen.. 1-2 Puffs Every 4-6 Hours As Needed For Increase Cough/wheeze or Short of Breath 5)  Albuterol Sulfate (2.5 Mg/31ml) 0.083%  Nebu (Albuterol Sulfate) .Marland Kitchen.. 1 Neb Every 6 Hours As Needed For Severe Short of Breath Not Responding To Inhaler With Albuterol 6)  Onetouch Lancets   Misc (Lancets) .... Use As Directed With Meter Three Times Daily, in The Am Before Meals and 2 Hours After Lunch and 2 Hours After Dinner 7)  Onetouch Test   Strp (Glucose Blood) .... Use One Test Strip Three Times Daily 8)  Bd Insulin Syringe Ultrafine 31g X 5/16" 0.3 Ml  Misc (Insulin Syringe-Needle U-100) .... Use As Directed 9)  Mucinex Dm 30-600 Mg Xr12h-Tab (Dextromethorphan-Guaifenesin) .Marland Kitchen.. 1-2 Every 12 Hours For Cough or Congestion 10)  Pravastatin Sodium 40 Mg Tabs (Pravastatin Sodium) .Marland Kitchen.. 1 Qhs 11)   Hyzaar 50-12.5 Mg Tabs (Losartan Potassium-Hctz) .Marland Kitchen.. 1 Qd 12)  Hydrocodone-Acetaminophen 5-325 Mg Tabs (Hydrocodone-Acetaminophen) .... 1/2-1 Every 4 Hrs As Needed For Pain 13)  Symbicort 80-4.5 Mcg/act  Aero (Budesonide-Formoterol Fumarate) .... 2 Puffs First Thing  in Am and 2 Puffs Again in Pm About 12 Hours Later  Allergies (verified): 1)  ! * Altace 2)  ! * Asprin  Past History:  Past Medical History: Last updated: 01/04/2010 CHEST WALL PAIN, ANTERIOR (ICD-786.52) COUGH (ICD-786.2) UTI (ICD-599.0) OSTEOPOROSIS (ICD-733.00) HYPERTENSION (ICD-401.9) HYPERLIPIDEMIA (ICD-272.4) DIVERTICULITIS, HX OF (ICD-V12.79) DIABETES MELLITUS, TYPE II (ICD-250.00) COLON CANCER, HX OF (ICD-V10.05) ASTHMA (ICD-493.90)   -PFT's 03/18/08 minimal airflow obstruction   -PFT's January 04, 2010 No sign airflow obstruction   -Mastered HFA technique August 13, 2008 > confimed November 23, 2009  ANXIETY (ICD-300.00) ANEMIA-IRON DEFICIENCY (ICD-280.9) ALLERGIC RHINITIS (ICD-477.9)  IBS GERD  Review of Systems  The patient denies hypoglycemia.    Physical Exam  General:  obese.  no distress  Pulses:  dorsalis pedis intact bilat.   Extremities:  no deformity.  no ulcer on the feet.  feet are of normal color and temp.  no edema mycotic toenails.   Neurologic:  sensation is intact to touch  on the feet  Additional Exam:  Hemoglobin A1C       [H]  6.6 %   Impression & Recommendations:  Problem # 1:  DIABETES MELLITUS, TYPE II (ICD-250.00) a1c is much better than reported cbg's  Other Orders: TLB-A1C / Hgb A1C (Glycohemoglobin) (83036-A1C) Est. Patient Level III [id number])  Patient Instructions: 1)  check your blood sugar 2 times a day.  vary the time of day when you check, between before the 3 meals, and at bedtime.  also check if you have symptoms of your blood sugar being too high or too low.  please keep a record of the readings and bring it to your next appointment here.  please call us  sooner if you are having low blood sugar episodes. 2)  blood tests are being ordered for you today.  please call [phone] to hear your test results. 3)  pending the test results, please continue novolog three times a day, (just before each meal) 25-20-40 units. 4)  Please schedule a physical appointment in 3 months. 5)  (update: i left message on phone-tree:  rx as we discussed) Prescriptions: NORVASC 10 MG  TABS (AMLODIPINE BESYLATE) one half daily  #30 Tablet x 11   Entered and Authorized by:   Donavan Foil MD   Signed by:   Donavan Foil MD on 04/13/2010   Method used:   Electronically to        [facility] [address] [phone]* (retail)       [address]       [place], [place]  [address]       Ph: [phone] or [phone]       Fax: [phone]   RxID:   [id number]

## 2010-11-01 NOTE — Progress Notes (Signed)
Summary: Letter request  Phone Note Call from Patient Call back at Home Phone [phone]   Summary of Call: Patient called today stating that she needs a letter for her grandson to give to [facility]. She was sick and visited the office frequently earlier this year and her grandson was the one helping/bring her. Per the patient it needs to list that she had walking pneumonia, diabetes, and her other health problems. Please call when letter is ready.  Please advise. Initial call taken by: Ernestene Mention CMA,  April 28, 2010 8:50 AM  Follow-up for Phone Call        i printed Follow-up by: Donavan Foil MD,  May 03, 2010 12:58 PM  Additional Follow-up for Phone Call Additional follow up Details #1::        Pt informed, Letter in cabinet for pt pick up Additional Follow-up by: Crissie Sickles, CMA,  May 03, 2010 1:58 PM

## 2010-11-01 NOTE — Assessment & Plan Note (Signed)
Summary: Pulmonary/ ext ov with hfa teaching near 100%   Copy to:  Dr. Loanne Drilling Primary Provider/Referring Provider:  Loanne Drilling  CC:  Followup.  Pt states that she had PNA in Jan 2011 and since then has had increased SOB.  She states that SOB is only with exertion.  She gets SOB walking long distances.  She also c/o dry cough x several months.  .  History of Present Illness:  41 yobf quit smoking 1988  with h/o  asthma who had  been using Advair on a p.r.n. basis and noticing increasing dyspnea and need for rescue therapy x one mo when seen 01/06/08 for pulmonary evaluation so  Advair stopped. Began Symbicort 160/4.40mcg 2 puffs two times a day.   Returned 02/13/08  improved with decreased dyspnea and no rescue use   Returned  03/18/08 symptom free no rescue needed, stopped reglan, no flare  April 17, 2008 did not use am symbicort but no symptoms or need for rescue, asked her to use symbicort consistently  July 01, 2008 ov reports everything better until 9/28 increaese cough/ sneeze and watery discharge,   rx predisone 10  4 each am x 2days, 2x2days, 1x2days and stop   August 13, 2008 ov co all better, did not lose ground off predsione in terms of cough or sob.   November 23, 2009 Followup.  Pt states that she had PNA in Jan 2011 and since then has had increased SOB.  She states that SOB is only with exertion.  She gets SOB walking long distances.  She also c/o dry cough x several months.  not compliant with symbicort. Pt denies any significant sore throat, dysphagia, itching, sneezing,  nasal congestion or excess secretions,  fever, chills, sweats, unintended wt loss, pleuritic or exertional cp, hempoptysis, change in activity tolerance  orthopnea pnd or leg swelling Pt also denies any obvious fluctuation in symptoms with weather or environmental change or other alleviating or aggravating factors.       Current Medications (verified): 1)  Symbicort 160-4.5 Mcg/act Aero  (Budesonide-Formoterol Fumarate) .... Inhale 2 Puffs Two Times A Day 2)  Singulair 10 Mg  Tabs (Montelukast Sodium) .... Take 1 Tablet By Mouth Once A Day 3)  Norvasc 10 Mg  Tabs (Amlodipine Besylate) .... One Half Daily 4)  Novolog 100 Unit/ml  Soln (Insulin Aspart) .... Three Times A Day (Before Every Meal) 25-20-30 Units. 5)  Ventolin Hfa 108 (90 Base) Mcg/act  Aers (Albuterol Sulfate) .Marland Kitchen.. 1-2 Puffs Every 4-6 Hours As Needed For Increase Cough/wheeze or Short of Breath 6)  Albuterol Sulfate (2.5 Mg/80ml) 0.083%  Nebu (Albuterol Sulfate) .Marland Kitchen.. 1 Neb Every 6 Hours As Needed For Severe Short of Breath Not Responding To Inhaler With Albuterol 7)  Onetouch Lancets   Misc (Lancets) .... Use As Directed With Meter Three Times Daily, in The Am Before Meals and 2 Hours After Lunch and 2 Hours After Dinner 8)  Onetouch Test   Strp (Glucose Blood) .... Use One Test Strip Three Times Daily 9)  Bd Insulin Syringe Ultrafine 31g X 5/16" 0.3 Ml  Misc (Insulin Syringe-Needle U-100) .... Use As Directed 10)  Mucinex Dm 30-600 Mg Xr12h-Tab (Dextromethorphan-Guaifenesin) .Marland Kitchen.. 1-2 Every 12 Hours For Cough or Congestion 11)  Oxycodone Hcl 5 Mg Caps (Oxycodone Hcl) .Marland Kitchen.. 1 By Mouth Q 6 Hrs As Needed Pain 12)  Pravastatin Sodium 40 Mg Tabs (Pravastatin Sodium) .Marland Kitchen.. 1 Qhs 13)  Flexeril 5 Mg Tabs (Cyclobenzaprine Hcl) .Marland Kitchen.. 1po Three Times A Day  As Needed 14)  Hyzaar 50-12.5 Mg Tabs (Losartan Potassium-Hctz) .Marland Kitchen.. 1 Qd  Allergies (verified): 1)  ! * Altace 2)  ! * Asprin  Past History:  Past Medical History: CHEST WALL PAIN, ANTERIOR (ICD-786.52) COUGH (ICD-786.2) UTI (ICD-599.0) OSTEOPOROSIS (ICD-733.00) HYPERTENSION (ICD-401.9) HYPERLIPIDEMIA (ICD-272.4) DIVERTICULITIS, HX OF (ICD-V12.79) DIABETES MELLITUS, TYPE II (ICD-250.00) COLON CANCER, HX OF (ICD-V10.05) ASTHMA (ICD-493.90)   -PFT's 03/18/08 minimal airflow obstruction   -Mastered HFA technique August 13, 2008 > confimed November 23, 2009  ANXIETY  (ICD-300.00) ANEMIA-IRON DEFICIENCY (ICD-280.9) ALLERGIC RHINITIS (ICD-477.9)  IBS GERD  Vital Signs:  Patient profile:   75 year old female Weight:      202 pounds O2 Sat:      97 % on Room air Temp:     98.3 degrees F oral Pulse rate:   86 / minute BP sitting:   156 / 60  (left arm)  Vitals Entered By: Tilden Dome (November 23, 2009 9:46 AM)  O2 Flow:  Room air  Physical Exam  Additional Exam:  wt 198 > 202 November 23, 2009 HEENT mild turbinate edema.  Oropharynx no thrush or excess pnd or cobblestoning.  No JVD or cervical adenopathy. Mild accessory muscle hypertrophy. Trachea midline, nl thryroid. Chest was hyperinflated by percussion with diminished breath sounds and moderate increased exp time without wheeze. Hoover sign positive at mid inspiration. Regular rate and rhythm without murmur gallop or rub or increase P2 or edema.  Abd: no hsm, nl excursion. Ext warm without cyanosis or clubbing.     Impression & Recommendations:  Problem # 1:  ASTHMA (ICD-493.90)   DDX of  difficult airways managment all start with A and  include Adherence, Ace Inhibitors, Acid Reflux, Active Sinus Disease, Alpha 1 Antitripsin deficiency, Anxiety masquerading as Airways dz,  ABPA,  allergy(esp in young), Aspiration (esp in elderly), Adverse effects of DPI,  Active smokers, plus one B  = Beta blocker use..   Adherence remains the main obstacle.  I discussed the importance of understanding the goals of asthma therapy including the rule of 2's as it applies to the use of a rescue inhaler.   I spent extra time with the patient today explaining optimal mdi  technique.  This improved from  90-100%  Acid reflux also in ddx   Each maintenance medication was reviewed in detail including most importantly the difference between maintenance and as needed and under what circumstances the prns are to be used. See instructions for specific recommendations   Medications Added to Medication List This  Visit: 1)  Symbicort 160-4.5 Mcg/act Aero (Budesonide-formoterol fumarate) .... 2 puffs first thing  in am and 2 puffs again in pm about 12 hours later  Other Orders: Est. Patient Level IV [id number])   Patient Instructions: 1)  Symbicort 160 2 puffs first thing  in am and 2 puffs again in pm about 12 hours later 2)  Work on perfecting inhaler technique:  relax and blow all the way out then take a nice smooth deep breath back in, triggering the inhaler at same time you start breathing in and hold for at least a few seconds 3)  GERD (REFLUX)  is a common cause of respiratory symptoms. It commonly presents without heartburn and can be treated with medication, but also with lifestyle changes including avoidance of late meals, excessive alcohol, smoking cessation, and avoid fatty foods, chocolate, peppermint, colas, red wine, and acidic juices such as orange juice. NO MINT OR MENTHOL PRODUCTS SO NO COUGH DROPS  4)  USE SUGARLESS CANDY INSTEAD (jolley ranchers)  5)  NO OIL BASED VITAMINS  6)  Please schedule a follow-up appointment in 6 weeks, sooner if needed with PFT's on return Prescriptions: SYMBICORT 160-4.5 MCG/ACT AERO (BUDESONIDE-FORMOTEROL FUMARATE) 2 puffs first thing  in am and 2 puffs again in pm about 12 hours later  #1 x 11   Entered and Authorized by:   Tanda Rockers MD   Signed by:   Tanda Rockers MD on 11/23/2009   Method used:   Electronically to        [facility]. [id number]* (retail)       [address]       [place], [place]  [address]       Ph: [phone] or [phone]       Fax: [phone]   RxID:   [id number]   Appended Document: Pulmonary/ ext ov with hfa teaching near 100% cxr reviewed from 11/09/09 is wnl

## 2010-11-01 NOTE — Assessment & Plan Note (Signed)
Summary: 3 MTH YEARLY---STC   Vital Signs:  Patient profile:   75 year old female Height:      67.5 inches (171.45 cm) Weight:      202.50 pounds (92.05 kg) BMI:     31.36 O2 Sat:      97 % on Room air Temp:     97.7 degrees F (36.50 degrees C) oral Pulse rate:   72 / minute BP sitting:   132 / 62  (left arm) Cuff size:   large  Vitals Entered By: Rebeca Alert MA (July 13, 2010 9:38 AM)  O2 Flow:  Room air CC: Physical/aj Is Patient Diabetic? Yes   Referring Provider:  Dr. Loanne Drilling Primary Provider:  Loanne Drilling  CC:  Physical/aj.  History of Present Illness: here for regular wellness examination.  she's feeling pretty well in general, and does not drink or smoke. no cbg record, but states cbg's are lowest in am.   Current Medications (verified): 1)  Singulair 10 Mg  Tabs (Montelukast Sodium) .... Take 1 Tablet By Mouth Once A Day 2)  Norvasc 10 Mg  Tabs (Amlodipine Besylate) .... One Half Daily 3)  Novolog 100 Unit/ml  Soln (Insulin Aspart) .... Three Times A Day (Before Every Meal) 25-20-40 Units. 4)  Ventolin Hfa 108 (90 Base) Mcg/act  Aers (Albuterol Sulfate) .Marland Kitchen.. 1-2 Puffs Every 4-6 Hours As Needed For Increase Cough/wheeze or Short of Breath 5)  Albuterol Sulfate (2.5 Mg/36ml) 0.083%  Nebu (Albuterol Sulfate) .Marland Kitchen.. 1 Neb Every 6 Hours As Needed For Severe Short of Breath Not Responding To Inhaler With Albuterol 6)  Onetouch Lancets   Misc (Lancets) .... Use As Directed With Meter Three Times Daily, in The Am Before Meals and 2 Hours After Lunch and 2 Hours After Dinner 7)  Onetouch Test   Strp (Glucose Blood) .... Use One Test Strip Three Times Daily 8)  Bd Insulin Syringe Ultrafine 31g X 5/16" 0.3 Ml  Misc (Insulin Syringe-Needle U-100) .... Use As Directed 9)  Mucinex Dm 30-600 Mg Xr12h-Tab (Dextromethorphan-Guaifenesin) .Marland Kitchen.. 1-2 Every 12 Hours For Cough or Congestion 10)  Pravastatin Sodium 40 Mg Tabs (Pravastatin Sodium) .Marland Kitchen.. 1 Qhs 11)  Hyzaar 50-12.5 Mg Tabs  (Losartan Potassium-Hctz) .Marland Kitchen.. 1 Qd 12)  Hydrocodone-Acetaminophen 5-325 Mg Tabs (Hydrocodone-Acetaminophen) .... 1/2-1 Every 4 Hrs As Needed For Pain 13)  Symbicort 80-4.5 Mcg/act  Aero (Budesonide-Formoterol Fumarate) .... 2 Puffs First Thing  in Am and 2 Puffs Again in Pm About 12 Hours Later  Allergies (verified): 1)  ! * Altace 2)  ! * Asprin  Past History:  Past Medical History: Last updated: 01/04/2010 CHEST WALL PAIN, ANTERIOR (ICD-786.52) COUGH (ICD-786.2) UTI (ICD-599.0) OSTEOPOROSIS (ICD-733.00) HYPERTENSION (ICD-401.9) HYPERLIPIDEMIA (ICD-272.4) DIVERTICULITIS, HX OF (ICD-V12.79) DIABETES MELLITUS, TYPE II (ICD-250.00) COLON CANCER, HX OF (ICD-V10.05) ASTHMA (ICD-493.90)   -PFT's 03/18/08 minimal airflow obstruction   -PFT's January 04, 2010 No sign airflow obstruction   -Mastered HFA technique August 13, 2008 > confimed November 23, 2009  ANXIETY (ICD-300.00) ANEMIA-IRON DEFICIENCY (ICD-280.9) ALLERGIC RHINITIS (ICD-477.9)  IBS GERD  Family History: Reviewed history from 01/20/2009 and no changes required. negative for atopy or asthma or respiratory disease to her knowledge brother has lung cancer another brother died from  melanoma  Social History: Reviewed history from 01/20/2009 and no changes required. she reports quit smoking 1988 retired single  Review of Systems  The patient denies fever, weight loss, weight gain, vision loss, decreased hearing, chest pain, syncope, headaches, abdominal pain, melena, hematochezia, severe indigestion/heartburn,  suspicious skin lesions, and depression.         no change in chronic cough and doe  Physical Exam  General:  obese.  no distress  Head:  head: no deformity eyes: no periorbital swelling, no proptosis external nose and ears are normal mouth: no lesion seen  Neck:  Supple without thyroid enlargement or tenderness.  Breasts:  sees gyn  Lungs:  Clear to auscultation bilaterally. Normal respiratory  effort.  Heart:  Regular rate and rhythm without murmurs or gallops noted. Normal S1,S2.   Abdomen:  abdomen is soft, nontender.  no hepatosplenomegaly.   not distended.  no hernia  Rectal:  sees gyn  Genitalia:  sees gyn  Msk:  muscle bulk and strength are grossly normal.  no obvious joint swelling.  gait is normal and steady  Pulses:  dorsalis pedis intact bilat.  no carotid bruit  Extremities:  no deformity.  no ulcer on the feet.  feet are of normal color and temp.  no edema mycotic toenails.   Neurologic:  cn 2-12 grossly intact.   readily moves all 4's.   sensation is intact to touch on the feet  Skin:  normal texture and temp.  no rash.  not diaphoretic  Cervical Nodes:  No significant adenopathy.  Psych:  Alert and cooperative; normal mood and affect; normal attention span and concentration.   Additional Exam:   Hemoglobin           [L]  11.6 g/dL                   12.0-15.0   Hematocrit           [L]  33.8 %            Impression & Recommendations:  Problem # 1:  ROUTINE GENERAL MEDICAL EXAM@HEALTH  CARE FACL (ICD-V70.0)  Other Orders: TLB-CBC Platelet - w/Differential (85025-CBCD) TLB-IBC Pnl (Iron/FE;Transferrin) (83550-IBC) TLB-A1C / Hgb A1C (Glycohemoglobin) (83036-A1C) Est. Patient 65& > [id number])  Preventive Care Screening     gyn is [facility]   Patient Instructions: 1)  please consider these measures for your health:  minimize alcohol.  do not use tobacco products.  have a colonoscopy at least every 10 years from age 43.  keep firearms safely stored.  always use seat belts.  have working smoke alarms in your home.  see an eye doctor and dentist regularly.  never drive under the influence of alcohol or drugs (including prescription drugs).   2)  please let me know what your wishes would be, if artificial life support measures should become necessary.  it is critically important to prevent falling down (keep floor areas well-lit, dry, and free of loose  objects). 3)  (update:  we discussed code status.  pt requests full code, but would not want to be started or maintained on artificial life-support measures if there was not a reasonable chance of recovery) 4)  flu shot is declined. 5)  (update: i left message on phone-tree:  we'll follow the anemia) Prescriptions: NORVASC 10 MG  TABS (AMLODIPINE BESYLATE) one half daily  #90 x 2   Entered and Authorized by:   Donavan Foil MD   Signed by:   Donavan Foil MD on 07/13/2010   Method used:   Print then Give to Patient   RxID:   [id number]    Preventive Care Screening     gyn is [facility]

## 2010-11-01 NOTE — Progress Notes (Signed)
Summary: referral  Phone Note Call from Patient Call back at Home Phone [phone]   Summary of Call: Patient left message on triage that she is to be referred to a specialist. Please advise. Initial call taken by: Ernestene Mention,  December 24, 2009 12:42 PM  Follow-up for Phone Call        sent Follow-up by: Donavan Foil MD,  December 24, 2009 12:44 PM  Additional Follow-up for Phone Call Additional follow up Details #1::        Patient notified and will await call from [facility]. Additional Follow-up by: Ernestene Mention,  December 24, 2009 12:55 PM

## 2010-11-01 NOTE — Letter (Signed)
Summary: Generic Letter  [facility]  [address] [place], [place] [address]   Phone: [phone]  Fax: [phone]    05/03/2010  HAANIYA BREWTON [address] [place], [place]  [address]  Dear Ms. Turri,  This is to certify that you have been seen at [facility] offices 6 times so far in 2011.  You have been seen for diabetes, asthma, knee pain, high cholesterol, and possible pneumonia.    Sincerely,   Renato Shin MD

## 2010-11-01 NOTE — Assessment & Plan Note (Signed)
Summary: COUGH/CD   Vital Signs:  Patient profile:   75 year old female Height:      67.5 inches (171.45 cm) Weight:      189.50 pounds (86.14 kg) O2 Sat:      97 % on Room air Temp:     100.0 degrees F (37.78 degrees C) oral Pulse rate:   87 / minute BP sitting:   142 / 64  (left arm) Cuff size:   large  Vitals Entered By: Gardenia Phlegm CMA (October 04, 2009 2:39 PM)  O2 Flow:  Room air CC: Cough X5days, tightness in chest, SOB, and nausea X2 days/ CF Is Patient Diabetic? Yes   Referring Provider:  Dr. Loanne Drilling Primary Provider:  Loanne Drilling  CC:  Cough X5days, tightness in chest, SOB, and and nausea X2 days/ CF.  History of Present Illness: no cbg record, but states cbg's are improved to 147-200.  she is unaware of any trend throughout the day.  she says it is higher after eating than if she does not eat for a few hours. pt states 4 days of prod-quality cough, and asssociated moderate associated pain in the chest.  she also reports headache, nasal congestion, and sore throat.  Current Medications (verified): 1)  Symbicort 160-4.5 Mcg/act Aero (Budesonide-Formoterol Fumarate) .... Inhale 2 Puffs Two Times A Day 2)  Singulair 10 Mg  Tabs (Montelukast Sodium) .... Take 1 Tablet By Mouth Once A Day 3)  Norvasc 10 Mg  Tabs (Amlodipine Besylate) .... One Half Daily 4)  Lantus 100 Unit/ml Soln (Insulin Glargine) .Marland Kitchen.. 10 Units At Bedtime 5)  Novolog 100 Unit/ml  Soln (Insulin Aspart) .Marland Kitchen.. 15 Units Three Times A Day (Before Every Meal) 6)  Ventolin Hfa 108 (90 Base) Mcg/act  Aers (Albuterol Sulfate) .Marland Kitchen.. 1-2 Puffs Every 4-6 Hours As Needed For Increase Cough/wheeze or Short of Breath 7)  Albuterol Sulfate (2.5 Mg/109ml) 0.083%  Nebu (Albuterol Sulfate) .Marland Kitchen.. 1 Neb Every 6 Hours As Needed For Severe Short of Breath Not Responding To Inhaler With Albuterol 8)  Onetouch Lancets   Misc (Lancets) .... Use As Directed With Meter Three Times Daily, in The Am Before Meals and 2 Hours After Lunch and  2 Hours After Dinner 9)  Onetouch Test   Strp (Glucose Blood) .... Use One Test Strip Three Times Daily 10)  Bd Insulin Syringe Ultrafine 31g X 5/16" 0.3 Ml  Misc (Insulin Syringe-Needle U-100) .... Use As Directed 11)  Mucinex Dm 30-600 Mg Xr12h-Tab (Dextromethorphan-Guaifenesin) .Marland Kitchen.. 1-2 Every 12 Hours For Cough or Congestion 12)  Omeprazole 40 Mg Cpdr (Omeprazole) .... Qd 13)  Oxycodone Hcl 5 Mg Caps (Oxycodone Hcl) .Marland Kitchen.. 1 By Mouth Q 6 Hrs As Needed Pain 14)  Pravastatin Sodium 40 Mg Tabs (Pravastatin Sodium) .Marland Kitchen.. 1 Qhs 15)  Flexeril 5 Mg Tabs (Cyclobenzaprine Hcl) .Marland Kitchen.. 1po Three Times A Day As Needed 16)  Hyzaar 50-12.5 Mg Tabs (Losartan Potassium-Hctz) .Marland Kitchen.. 1 Qd 17)  Azithromycin 500 Mg Tabs (Azithromycin) .Marland Kitchen.. 1 Qd  Allergies (verified): 1)  ! * Altace 2)  ! * Asprin  Past History:  Past Medical History: Last updated: 05/07/2009 CHEST WALL PAIN, ANTERIOR (ICD-786.52) COUGH (ICD-786.2) UTI (ICD-599.0) OSTEOPOROSIS (ICD-733.00) HYPERTENSION (ICD-401.9) HYPERLIPIDEMIA (ICD-272.4) DIVERTICULITIS, HX OF (ICD-V12.79) DIABETES MELLITUS, TYPE II (ICD-250.00) COLON CANCER, HX OF (ICD-V10.05) ASTHMA (ICD-493.90)   -PFT's 03/18/08 minimal airflow obstruction   -Mastered HFA technique August 13, 2008  ANXIETY (ICD-300.00) ANEMIA-IRON DEFICIENCY (ICD-280.9) ALLERGIC RHINITIS (ICD-477.9)  IBS GERD  Review of Systems  The patient denies hypoglycemia.         denies earache  Physical Exam  General:  no distress  Head:  head: no deformity eyes: no periorbital swelling, no proptosis external nose and ears are normal mouth: no lesion seen Lungs:  clear except for few expiratory wheezes. Additional Exam:  CHEST - 2 VIEW Questionable increased air space opacification at the right lung base.  Pneumonia cannot be excluded.   Impression & Recommendations:  Problem # 1:  DIABETES MELLITUS, TYPE II (ICD-250.00) needs increased rx  Problem # 2:   uri persistent  Medications Added to Medication List This Visit: 1)  Lantus 100 Unit/ml Soln (Insulin glargine) .... 5 units at bedtime 2)  Novolog 100 Unit/ml Soln (Insulin aspart) .... 25 units three times a day (before every meal) 3)  Cefuroxime Axetil 250 Mg Tabs (Cefuroxime axetil) .Marland Kitchen.. 1 bid 4)  Methylprednisolone (pak) 4 Mg Tabs (Methylprednisolone) .... As dir 5)  Promethazine-dm 6.25-15 Mg/77ml Syrp (Promethazine-dm) .Marland Kitchen.. 1 tsp q4h as needed cough  Other Orders: T-2 View CXR (71020TC) Est. Patient Level IV [id number])  Patient Instructions: 1)  check your blood sugar 2 times a day.  vary the time of day when you check, between before the 3 meals, and at bedtime.  also check if you have symptoms of your blood sugar being too high or too low.  please keep a record of the readings and bring it to your next appointment here.  please call us sooner if you are having low blood sugar episodes. 2)  increase novolog to 25 units three times a day, (just before each meal).   3)  reduce lantus 5 units at night. 4)  return 1 month. 5)  cefuroxime 250 mg two times a day 6)  medrol dosepack 7)  chest x ray today 8)  promethazine-dm 1 tsp every 4 hrs as needed for cough 9)  (update: i left message on phone-tree:  i gave cxr result.  return if not better soon, or if worse.) Prescriptions: PROMETHAZINE-DM 6.25-15 MG/5ML SYRP (PROMETHAZINE-DM) 1 tsp q4h as needed cough  #8 oz x 1   Entered and Authorized by:   Donavan Foil MD   Signed by:   Donavan Foil MD on 10/04/2009   Method used:   Electronically to        [facility]. [id number]* (retail)       [address]       [place], [place]  [address]       Ph: [phone] or [phone]       Fax: [phone]   RxID:   [id number] METHYLPREDNISOLONE (PAK) 4 MG TABS (METHYLPREDNISOLONE) as dir  #1 pack x 0   Entered and Authorized by:   Donavan Foil MD   Signed by:   Donavan Foil MD on 10/04/2009   Method used:    Electronically to        [facility]. [id number]* (retail)       [address].       [place], [place]  [address]       Ph: [phone] or [phone]       Fax: [phone]   RxID:   [id number] CEFUROXIME AXETIL 250 MG TABS (CEFUROXIME AXETIL) 1 bid  #14 x 0   Entered and Authorized by:   Donavan Foil MD   Signed by:  Donavan Foil MD on 10/04/2009   Method used:   Electronically to        [facility] [phone]* (retail)       [address]       [place], [place]  [address]       Ph: [phone] or [phone]       Fax: [phone]   RxID:   [id number]

## 2010-11-01 NOTE — Progress Notes (Signed)
  Phone Note Refill Request Message from:  Pharmacy  Refills Requested: Medication #1:  NORVASC 10 MG  TABS one half daily   Dosage confirmed as above?Dosage Confirmed  Method Requested: Fax to [facility] Initial call taken by: Rebeca Alert MA,  April 14, 2010 2:10 PM    Prescriptions: NORVASC 10 MG  TABS (AMLODIPINE BESYLATE) one half daily  #30 Tablet x 11   Entered by:   Rebeca Alert MA   Authorized by:   Donavan Foil MD   Signed by:   Rebeca Alert MA on 04/14/2010   Method used:   [facility] ...       [facility]  [address] [id number] ([facility]ail[facility]       [place].       [place], [place]  [address]       Ph: [phone] or [phone]       Fax: [phone]   RxID:   [id number]

## 2010-11-03 NOTE — Progress Notes (Signed)
Summary: Rx refill req  Phone Note Refill Request Message from:  Patient on October 28, 2010 10:58 AM  Refills Requested: Medication #1:  NOVOLOG 100 UNIT/ML  SOLN three times a day (before every meal) 25-20-40 units.   Dosage confirmed as above?Dosage Confirmed   Supply Requested: 3 months  Method Requested: Electronic Initial call taken by: Crissie Sickles, [place],  October 28, 2010 10:58 AM    Prescriptions: NOVOLOG 100 UNIT/ML  SOLN (INSULIN ASPART) three times a day (before every meal) 25-20-40 units.  #10 [place] x 2   Entered by:   Crissie Sickles, CMA   Authorized by:   Donavan Foil MD   Signed by:   Crissie Sickles, CMA on 10/28/2010   Method used:   Electronically to        [facility]. [id number]* (retail)       [address].       [place], [place]  [address]       Ph: [phone] or [phone]       Fax: [phone]   RxID:   [id number]

## 2011-01-04 ENCOUNTER — Other Ambulatory Visit: Payer: Self-pay

## 2011-01-04 MED ORDER — MONTELUKAST SODIUM 10 MG PO TABS: 10.0000 mg | ORAL_TABLET | Freq: Every day | ORAL | Status: DC

## 2011-01-04 NOTE — Telephone Encounter (Signed)
Pt called requesting samples or Rx of Singulair. Pt advised that samples are not available but that Rx was sent to pharmacy

## 2011-01-28 ENCOUNTER — Other Ambulatory Visit: Payer: Self-pay | Admitting: Endocrinology

## 2011-01-30 ENCOUNTER — Other Ambulatory Visit: Payer: Self-pay | Admitting: Endocrinology

## 2011-01-30 NOTE — Telephone Encounter (Signed)
Rx Done . 

## 2011-02-02 ENCOUNTER — Other Ambulatory Visit: Payer: Self-pay | Admitting: Endocrinology

## 2011-02-06 ENCOUNTER — Other Ambulatory Visit: Payer: Self-pay | Admitting: Internal Medicine

## 2011-02-07 ENCOUNTER — Other Ambulatory Visit: Payer: Self-pay | Admitting: Internal Medicine

## 2011-02-14 ENCOUNTER — Telehealth: Payer: Self-pay | Admitting: Internal Medicine

## 2011-02-14 MED ORDER — BUDESONIDE-FORMOTEROL FUMARATE 80-4.5 MCG/ACT IN AERO: 2.0000 | INHALATION_SPRAY | Freq: Two times a day (BID) | RESPIRATORY_TRACT | Status: DC

## 2011-02-14 NOTE — Discharge Summary (Signed)
[name], [name]                   ACCOUNT NO.:  [id number]   MEDICAL RECORD NO.:  [id number]          PATIENT TYPE:  INP   LOCATION:  5126                         FACILITY:  [facility]   PHYSICIAN:  Valerie A. Asa Lente, MDDATE OF BIRTH:  1936-09-24   DATE OF ADMISSION:  09/29/2007  DATE OF DISCHARGE:  10/03/2007                               DISCHARGE SUMMARY   DISCHARGE DIAGNOSES:  1. Uncontrolled type 2 diabetes.  Hemoglobin A1c of 11, now improved      back on insulin therapy.  See details below.  2. Urinary tract infection, improved symptoms.  Continue outpatient      therapy to complete a 7-day total of Cipro.  3. Acute renal insufficiency, resolved.  Discharge creatinine 1.1.  4. Pseudohyponatremia due to number one, resolved.  5. Hypokalemia, resolved.  6. History of irritable bowel and diverticulosis.  7. History of gastroesophageal reflux disease.  8. Hypertension.   DISCHARGE MEDICATIONS:  1. Lantus 15 units subcu q.h.s.  2. NovoLog 3 units t.i.d. meal coverage scheduled.  3. Norvasc 10 mg once daily.  4. Reglan 10 mg t.i.d. with meals.  5. Diovan/HCTZ 160/12.5 daily.  6. Singulair 10 mg once daily.  7. Cipro 500 mg b.i.d. x3 additional days.  8. The patient is instructed to discontinue her previously prescribed      metformin.   HOSPITAL FOLLOWUP:  Scheduled with primary care physician, Dr. Renato Shin, for Tuesday, January 13 at 10:45 a.m.  She was instructed to  pick up a glucometer at the front office of [facility] on Friday and record  her blood sugars at breakfast, lunch and dinner and bring this list to  her appointment with Dr. Loanne Drilling to review for med changes.   CONDITION ON DISCHARGE:  Medically stable and improved.  CBGs ranging in  the 100s to low 200s.   HOSPITAL COURSE:  Uncontrolled type 2 diabetes.  The patient is a  pleasant 75 year old woman with history of remote insulin used for  control of diabetes who was discontinued on her oral hypoglycemic  diabetic agents 3 months ago and left only on metformin due to an A1c of  4.8 in November 2008, who came to the emergency room complaining of  abdominal pain and weakness.  She had a random blood sugar in excess of  530 but no evidence of DKA.  There was evidence of UTI by positive UA  and she was begun on Bactrim the day prior to admission.  Due to  uncontrolled diabetes in the setting of UTI, she was admitted for  further evaluation and treatment of her diabetes.  She was begun on  insulin and metformin was discontinued, Lantus 10 units twice daily with  sliding scale coverage was administered which brought about rapid  improvement in control of her diabetic values.  She was dehydrated with  acute renal insufficiency due to the uncontrolled diabetic state, but  her creatinine improved nicely with IV fluids down to normal at 1.1  prior to discharge.  Her UTI was treated with Cipro but the urine  culture remained negative  likely due to prehospitalization antibiotics  with subsequent sterilization.  Her white count also reduced to normal  at 4.4 with continued antibiotic therapy and her repeat A1c done this  hospitalization shows a total level of 11.0.  As such, after discussion  and education with the patient, we have decided to return to insulin  management of her diabetes and have discontinued metformin indefinitely.  She has been re-educated by the diabetic coordinator on this use and  agrees to insulin therapy as we have described, although she is not  excited about returning to this use.  Her other acute medical issues  have all resolved and at this time she is felt stable for discharge  home.      Valerie A. Asa Lente, MD  Electronically Signed     VAL/MEDQ  D:  10/03/2007  T:  10/03/2007  Job:  [id number]

## 2011-02-14 NOTE — H&P (Signed)
[name], [name]                   ACCOUNT NO.:  [id number]   MEDICAL RECORD NO.:  [id number]          PATIENT TYPE:  INP   LOCATION:  5126                         FACILITY:  [facility]   PHYSICIAN:  Marletta Lor, MDDATE OF BIRTH:  28-Sep-1936   DATE OF ADMISSION:  09/29/2007  DATE OF DISCHARGE:                              HISTORY & PHYSICAL   CHIEF COMPLAINT:  Abdominal pain and weakness.   PRESENT ILLNESS:  The patient is a 75 year old black female with a long  history of type 2 diabetes.  She apparently has been treated with  insulin in the remote past and approximately 3 months ago oral  antidiabetic medications were discontinued.  One day prior to admission,  she was seen in the outpatient clinic complaining of lower abdominal  pain and dysuria.  She was noted to have a blood sugar in excess of 500  and also urinary tract infection.  She was placed on Bactrim DS at that  time.  Today, she presents complaining of increasing weakness, nausea  but no vomiting.  She has had increasing abdominal pain and progressive  weakness.  ED evaluation revealed a random blood sugar in excess of 530.  She is now admitted for further evaluation and treatment of her urinary  tract infection as well as uncontrolled diabetes.   PAST MEDICAL HISTORY:  1. Is pertinent for a history of diverticulosis, an admission in 2002      for acute diverticulitis.  2. She has a history also of constipation, Crohn, IBS.  3. Additionally, she has been followed by the GI service for diabetic      gastroparesis.  4. She has been treated multiple times for candidal esophagitis.  5. She has a history also of colonic polyps and cholelithiasis.  6. Diabetes.  7. Hypertension.  8. Asthma.  9. She states she was hospitalized approximately 7 years ago for      asthmatic bronchitis.   SURGICAL PROCEDURES:  Have included;  1. A partial hysterectomy.  2. Remote hemorrhoidectomy.   ALLERGIES:  Include ASPIRIN, which  precipitates asthma.   PRESENT MEDICAL REGIMEN:  Includes the following:  1. Metformin 500 mg b.i.d. begun 1 day prior to admission.  2. Bactrim DS b.i.d.  3. Singulair 10 mg daily.  4. Reglan 10 mg q.i.d.  5. Tramadol 50 mg every 6 hours p.r.n. pain.  6. Amlodipine 10 mg daily.  7. Diovan HCT 160/12.5 daily.   SOCIAL HISTORY:  She is single, a mother of five with a number of  grandchildren and great-grandchildren.  She is a former smoker but no  alcohol or tobacco products at present time   FAMILY HISTORY:  Both parents died in their 71s.  Her father with  history of diabetes and gout.  Two brothers, one deceased of cancer,  unclear type, probably either a melanoma or a lymphoma.  Two sisters  both with diabetes and hypertension.   PHYSICAL EXAMINATION:  GENERAL:  Examination revealed an elderly black  female who appeared to be weak but no acute distress.  SKIN:  Warm and  dry without rash.  HEENT:  Revealed normal pupil responses.  Conjunctiva clear.  ENT  unremarkable.  Oropharynx was benign.  Numerous dentition missing.  NECK:  No bruits or neck vein distention.  CHEST: Was clear.  CARDIOVASCULAR:  Exam normal S1-S2.  No murmurs or gallops.  ABDOMEN:  Revealed mild suprapubic tenderness, mildly obese, soft.  There is no guarding.  Bowel sounds present.  EXTREMITIES: No edema.  Peripheral pulses were intact   IMPRESSION:  1. Urinary tract infection.  2. Uncontrolled diabetes.   ADDITIONAL DIAGNOSES:  1. Hypertension.  2. Asthma.  3. History of diverticulitis.  4. History of diabetic gastroparesis.  5. History of candidal esophagitis.  6. Constipation, Crohn, irritable bowel syndrome.   DISPOSITION:  The patient will be admitted to the hospital.  She will be  supported on IV fluids.  A urine culture will be obtained and pending  results, will be placed on IV Cipro 400 mg every 12 hours.  Insulin  therapy will be initiated.      Marletta Lor, MD   Electronically Signed     PFK/MEDQ  D:  09/29/2007  T:  09/30/2007  Job:  [id number]

## 2011-02-14 NOTE — Consult Note (Signed)
[name], [name]                   ACCOUNT NO.:  [id number]   MEDICAL RECORD NO.:  [id number]          PATIENT TYPE:  OUT   LOCATION:  GYN                          FACILITY:  [facility]   PHYSICIAN:  Marti Sleigh, M.D.DATE OF BIRTH:  21-Feb-1936   DATE OF CONSULTATION:  08/14/2008  DATE OF DISCHARGE:                                 CONSULTATION   CHIEF COMPLAINT:  Pelvic mass.   HISTORY OF PRESENT ILLNESS:  A 75 year old white female seen in  consultation at the request of Dr. Ronita Hipps regarding management of a  newly diagnosed pelvic mass.  Patient reports that she has had pelvic  pain for approximately 3-4 months, which has been intermittent.  The  pain is predominantly left-sided.  The only other symptom the patient  has had is some abdominal bloating.  She denies any nausea or vomiting  or any change in bowel habits or weight loss.   Past gynecologic history is relevant for undergoing a total abdominal  hysterectomy approximately 30 years ago.  She is uncertain as to why she  had the hysterectomy but was probably secondary to bleeding.   PAST MEDICAL HISTORY/MEDICAL ILLNESSES:  1. Diabetes (insulin dependent).  2. Hypertension.  3. Asthma.   PAST SURGICAL HISTORY:  1. Total abdominal hysterectomy.  2. Hemorrhoid.   DRUG ALLERGIES:  ASPIRIN (causes asthma).   CURRENT MEDICATIONS:  1. Diovan.  2. Norvasc.  3. Singulair.  4. Symbicort 2 puffs twice daily.  5. Humalog insulin 3 units 3 times daily.  6. Lantus 15 units at night.   OBSTETRICAL HISTORY:  Gravida 5.   FAMILY HISTORY:  Patient has a brother with melanoma.  There is no  family history for breast, colon, or ovarian cancer.   SOCIAL HISTORY:  Patient is widowed.  She previously worked at [employer].  She does not smoke.   PHYSICAL EXAMINATION:  Height 5 foot 7.  Weight 188 pounds.  Blood  pressure 122/70.  Pulse 70.  Respiratory rate 18.  GENERAL:  Patient is a healthy African American female who appears  younger than stated age.  HEENT:  Negative.  NECK:  Supple without thyromegaly.  There is no supraclavicular or  inguinal adenopathy.  ABDOMEN:  Soft and nontender.  No mass, organomegaly, ascites, or  hernias are noted.  She does have a well-healed midline incision.  PELVIC:  EG/BUS, vagina, bladder, and urethra are normal.  The cervix  and uterus are surgically absent.  On bimanual examination, I am unable  to detect the mass.  Rectovaginal exam confirms the patient does have  some hemorrhoids.   LABORATORY WORK:  The patient's CT scan is reviewed.  Pertinent findings  include a 6 x 9 x 5.2 x 6.1 cm cystic mass in the right adnexa.  The  left ovary is not well seen.  The appendix appears normal.  In addition,  in the upper abdomen, she has some small gallstones, a left mid kidney  cyst, and diffuse wall thickening of the distal esophagus.   An ultrasound has also been obtained, showing that there are some  septations in the mass.  On ultrasound, the mass measures 7.4 x 5 x 6.7  cm.  There is no evidence of increased blood flow.   CA-125 is 5 U/ml.   IMPRESSION:  Septated pelvic mass associated with pain, possibly  secondary to torsion.  Based on the ultrasound characteristics and a  normal CA-125, I believe this is likely benign.  I would suggest that  the mass be removed to be certain of its histology as well as relieve  her of the pain.  I believe this could be performed laparoscopically,  although the patient and her daughter are informed that adhesions from  prior surgery may preclude safe removal of the mass laparoscopically.  In that eventuality, she would need to have a laparotomy through her  prior midline incision.  They also understand that if ovarian cancer  were encountered, we would proceed with surgical staging through a  midline laparotomy incision.  Finally, removal of the left ovary would  be advised if it can be easily accessed.   All questions are answered.   We will scheduled the surgery to be  performed on 09/01/2008 with Dr. Cindie Laroche as the primary attending  surgeon.      Marti Sleigh, M.D.  Electronically Signed     DC/MEDQ  D:  08/14/2008  T:  08/14/2008  Job:  [id number]   cc:   Lovenia Kim, M.D.  Fax: [phone]   Caswell Corwin, R.N.  [address], [place] [address]   Sean A. Loanne Drilling, [place]  [place] [address]

## 2011-02-14 NOTE — Op Note (Signed)
NAMEACELYNN, STUCKMAN                   ACCOUNT NO.:  [id number]   MEDICAL RECORD NO.:  [id number]          PATIENT TYPE:  AMB   LOCATION:  DAY                          FACILITY:  [facility]   PHYSICIAN:  John T. Clarene Essex, M.D.    DATE OF BIRTH:  May 06, 1936   DATE OF PROCEDURE:  09/01/2008  DATE OF DISCHARGE:                               OPERATIVE REPORT   PRIMARY SURGEON:  John T. Clarene Essex, M.D.   ASSISTANT:  Agnes Lawrence, M.D.   PREOPERATIVE DIAGNOSIS:  Septated right adnexal cyst.   POSTOPERATIVE DIAGNOSIS:  Benign right ovarian cyst; abdominopelvic  adhesions.   PROCEDURE:  Laparoscopy with laparoscopic lysis of omental and pelvic  adhesions, right salpingo-oophorectomy.   ANESTHESIA:  General endotracheal.   DESCRIPTION OF FINDINGS AND INDICATIONS FOR SURGERY:  This 75 year old  woman presented with pelvic pain and on scanning was found to have a 7-8  cm mass; on ultrasound there was a single septation and CA-125 value was  less than 15.  At the time of surgery there were multiple adhesions  between the omentum and anterior abdominal wall, along prior midline  incision.  The omentum was adherent into the pelvis.  A 6-7 cm smooth-  walled right ovarian cyst was adherent posterior cul-de-sac, right  pelvic side wall and sigmoid colon.  Upon inspection, it comprised  septated smooth-walled compartments containing clear serous fluid.  Because of the extensive adhesions, left ovary was not visualized; this  was normal on prior ultrasound.   DESCRIPTION OF PROCEDURE:  The patient was prepped and draped in the  [place] position using direct placement stirrups and shoulder blocks,  with the arms tucked preoperatively at her sides.  The abdominal entry  was effected using the Optiview 5 mm port insertion in the left upper  quadrant.  The skin incision was pre-injected with 0.25%  Marcaine.  Abdominal survey revealed extensive adhesions of the omentum to the  entire anterior abdominal  wall.  Accessory 5 mm ports were placed under  direct visualization, pre-injecting port sites in the right and left  lower quadrants, just inside the anterior superior iliac crest.  Using  sharp and blunt dissection with monopolar electrocautery for hemostasis,  the adhesions were taken down from the anterior abdominal wall to the  pelvis.  Then 10-12 mm ports were placed in the umbilical and suprapubic  regions, again injecting with 0.25% Marcaine prior to making skin  incision and inserting trocars under direct visualization.  Using sharp  and blunt dissection with electrocautery for hemostasis, residual  omental adhesions in the deep pelvis were freed.  The sigmoid colon was  densely adherent to the right pelvic sidewall and was mobilized away.  The upper margin of the cyst could be seen filling posterior cul-de-sac.  The right round ligament was elevated and divided with monopolar  electrocautery.  The right pelvic sidewall was opened parallel to the  infundibulopelvic ligament, above the level of the pelvic brim.  The  infundibulopelvic ligament was skeletonized and the pararectal and  paravesical spaces completely developed for identification of the  ureter.  Components of the infundibulopelvic ligament were taken using  bipolar cautery and divided.  The right sidewall was incised above the  level of the ureter, exposing the posterior portion of the cyst.  Using  sharp and blunt dissection with bipolar cautery for hemostasis, the  adhesions between the cyst and the colon medially were freed, and  between sidewall and cyst laterally were freed.  When the cyst was fully  mobilized, it was placed in an EndoCatch bag which was brought through  the umbilical port.  The cyst was punctured and aspirated within the bag  and removed.  Inspection revealed multiple simple loculations with  smooth walls and clear serous fluid.  The abdomen and pelvis were  copiously irrigated and inspected for  hemostasis.  The pneumoperitoneum  was deflated and the abdominal wall trocars removed.  The subumbilical  and suprapubic trocar sites were closed in layers using 0-Vicryl to  close the fascia and 4-0 Vicryl to close the skin.  The 5 mm port sites  were closed with Dermabond glue.  Sponge and instrument counts were  correct.   PATHOLOGY SPECIMEN:  Peritoneal cytology:  Right tube and ovary.   ESTIMATED BLOOD LOSS:  100 mL.   TRANSFUSIONS:  None.   DRAINS, PACKS, ETC.:  Foley dependent drainage.  Sponge and instrument  counts again were correct.      John T. Clarene Essex, M.D.  Electronically Signed     JTS/MEDQ  D:  09/01/2008  T:  09/01/2008  Job:  [id number]   cc:   Caswell Corwin, R.N.  [address]  [place], [place] [address]   Agnes Lawrence, M.D.  Fax: [place]. Loanne Drilling, [place] [address]  [place]  [place] [address]   Lovenia Kim, M.D.  Fax: [phone]

## 2011-02-14 NOTE — Consult Note (Signed)
[name], [name]                   ACCOUNT NO.:  [id number]   MEDICAL RECORD NO.:  [id number]          PATIENT TYPE:  OUT   LOCATION:  GYN                          FACILITY:  [facility]   PHYSICIAN:  Janie Morning, MD     DATE OF BIRTH:  1936/01/29   DATE OF CONSULTATION:  DATE OF DISCHARGE:                                 CONSULTATION   REASON FOR VISIT:  Postoperative check.   HISTORY OF PRESENT ILLNESS:  This 75 year old woman who presented with  pelvic pain was noted to have an 8 cm right pelvic mass with a simple  septation.  CA125 value is less than 15.  On September 01, 2008, she  underwent a laparoscopic right salpingo-oophorectomy.  Findings were  notable for the fact that there were significant adhesions between the  right ovarian cysts on the posterior cul de sac and the right pelvic  sidewall.  The left ovary was not visualized but was normal on prior  ultrasound.   [name] did well postoperatively and presents today entirely without  complaints.  She states that she has a good appetite, normal bowel  movements, no fever or chills, nausea or vomiting.   PHYSICAL EXAMINATION:  VITAL SIGNS:  Weight 187 pounds, blood pressure  151/87, pulse 94.  ABDOMEN:  Soft, nontender.  Incision sites are healed.  There is no  discharge appreciated.   Final pathology was consistent with a right ovary and fallopian tube  with a benign ovarian serous cystadenoma and a benign fallopian tube  with a paratubal cyst.   [name] has been asked to follow up with Dr. Ronita Hipps and has been told  to follow up from the GYN Practice within the next few months if there  are any other complaints.      Janie Morning, MD  Electronically Signed     WB/MEDQ  D:  10/08/2008  T:  10/08/2008  Job:  [id number]   cc:   Caswell Corwin, R.N.  [address]  [place], [place] [address]   Lovenia Kim, M.D.  Fax: [phone]

## 2011-02-14 NOTE — Telephone Encounter (Signed)
Symbicort sent to the pharmacy and pt is aware.

## 2011-02-17 NOTE — Discharge Summary (Signed)
[place]. [facility]  Patient:    [name], [name]                          MRN: [id number] Adm. Date:  [id number] Disc. Date: [id number] Attending:  Chancy Hurter Dictator:   Donaciano Eva, N.P. CC:         Gloris Ham, M.D. [facility]  Mayme Genta, M.D.   Discharge Summary  ADMISSION DIAGNOSES: 1. Acute diverticulitis. 2. Diabetic gastroparesis. 3. Iron deficiency anemia. 4. Hypokalemia, corrected. 5. Malnutrition.  DISCHARGE MEDICATIONS:  1. Norvasc 5 mg one p.o. q.d.  2. Singular 10 mg one p.o. q.d.  3. Flovent two puffs twice a day at 44 mcg inhaler.  4. Serevent two puffs twice a day.  5. Premarin 0.625 mg one per day.  6. Niferex 150 mg one capsule once a day with food.  7. Folic acid 1 mg one p.o. q.d. with food, take together with Niferex.  8. Toprol XL 100 mg one q.d.  9. Actos 45 mg one q.a.m. 10. Avapro 300 mg one p.o. q.d. 11. Methylprednisolone 4 mg one p.o. q.d. 12. Potassium 40 mEq one p.o. q.d. 13. Glucophage 500 mg XR b.i.d. 14. Reglan 10 mg 20 minutes q.a.c. and at bedtime.  PROCEDURES DURING ADMISSION: 1. On April 27, 2001, abdomen and chest views:    a. Cardiomegaly which is stable.  No active lung disease.    b. No bowel obstruction or free air was noted. 2. CT of the abdomen:    a. There was a question of small gallstones.  Consider an ultrasound to       evaluate further.    b. There was circumferential thickening of the distal esophagus and       question of a hiatal hernia.    c. There was single low density in the liver which was present on prior       scanning consistent with a benign process, such as a cyst. 3. The CT of the pelvis scans were then continued through the pelvis at 5 mm    intervals after oral and IV contrast media were given.  The urinary bladder    was decompressed and unremarkable.  No pelvis or fluid collection was seen.    The distal ureters were normal in caliber.  There was no  evidence of    diverticulitis by CT. 4. Ultrasound of the abdomen on April 29, 2001:    a. Probable nonshadowing gallstones in the gallbladder, but no evidence for       acute cholecystitis.    b. The remainder of the abdomen was normal. 5. On April 30, 2001, she had a hepatoliver function scan that was a normal    study.  The gallbladder was visualized.  The cystic duct was therefore    patent. 6. On May 01, 2001, she had a chest x-ray and again there was cardiomegaly    with mild failure and small effusions. 7. On May 01, 2001, there was another view of her abdomen.  The impression    was unremarkable abdomen.  SIGNIFICANT LABORATORY WORK:  On CMET on April 27, 2001, her potassium was 2.5, sodium 143, and glucose 127.  The BUN and creatinine were at 3 and 0.7, respectively.  Her amylase was normal at 54.  Iron was 18, total iron binding capacity was at 221, and percent saturation was at 8, all abnormally low.  The vitamin B12 was at 18.06 (abnormally high).  She was occult blood negative. Lipase was low at 17.  Hepatic function on April 29, 2001:  Total bilirubin 1.6, direct bilirubin 0.5, indirect bilirubin 1.1, representing slight elevated, alkaline phosphatase normal at 56, SGOT at 18, SGPT at 9, total protein low at 5.5, albumin low at 2.7.  As of May 06, 2001, sodium 139, potassium 3.6, glucose 140, BUN 6, and creatinine 0.6.  CBC as follows:  WBC 7.5, hemoglobin 10.6, hematocrit 32.2.  BRIEF HISTORY OF ADMISSION:  The patient was admitted with nausea, vomiting, and abdominal pain.  In addition, she was found to be hypokalemic.  She benefited from progressive runs of potassium so that by the point of discharge, her potassium was normal at 3.6.  BRIEF COURSE OF HOSPITALIZATION:  The patient was a poor historian regarding her medications.  She could not recall the names of many of her medications or the rational for taking them.  She was rehydrated and her potassium  was replenished.  This provided some relief from her symptoms in the initial days of hospitalization.  Her stools turned from diarrheal to formed.  She was started on Cipro and Flagyl.  She did continue, however, to complain of nausea and vomiting.  She had a decreasing BUN.  She had poor p.o. intake.  She had malnutrition per her labs.  A nutritional consult was started.  Her dietary issues were reviewed.  The patient seemed to understand her diabetes diet, as well as need for added protein.  On May 02, 2001, GI was consulted.  The patient has been a longstanding patient of Mayme Genta, M.D.  He suggested that this was acute diverticulitis, diabetic gastroparesis, that the cholelithiasis was asymptomatic, and that the iron deficiency was new and of uncertain etiology.  He also recommended to restart the Cipro and Flagyl for an additional three to five days.  The patient continued to improve.  Her diet was progressed.  At the point of discharge, she is eating and tolerating a full diet.  She is in good spirits and ready to be discharged home.  PHYSICAL EXAMINATION ON DISCHARGE:  The patient states that she feels much better and is eager to go home.  Her vital signs are stable at temperature 97.2 degrees, pulse 70, respirations 20, and blood pressure 129/70.  She is 96% on room air.  She is sitting at her bedside and seems to be enjoying a full breakfast without any difficulty.  Her CBG this morning is at 127.  She is smiling and animated.  Her heart is at regular rate and rhythm.  Her lungs are improved, especially after she clears her lungs with a good cough.  There is no wheezing.  GI:  She has bowel sounds x 4.  There is no tenderness.  Her extremities are without edema.  Musculoskeletal:  She is ambulating independently.  She has no complaint of back pain.  Her laboratory work is as above.  Her H&H has been stable since April 27, 2001. Her white blood cell count has improved  nicely.  ASSESSMENT AND PLAN:  1. Diverticulitis.  Continue on antibiotics for another two days p.o. 2. Gastroparesis.  She is being discharged on Reglan. 3. Hypokalemia.  Her home dose has been increased from 20-40. 4. She is to follow up with Standley Brooking. Panosh, M.D., in two weeks. 5. Her previous complaint of back pain seems to be associated with a finding    of  scoliosis during her current admission.  The patient was made aware of    this situation. 6. In addition to the above medications, the patient will be discharged on    Cipro 500 mg one capsule b.i.d. and Flagyl 250 mg one capsule b.i.d. DD:  05/06/01 TD:  05/08/01 Job: [id number]

## 2011-02-17 NOTE — Consult Note (Signed)
. [facility]  Patient:    [name], [name]                          MRN: [id number] Proc. Date: 05/02/01 Adm. Date:  [id number] Attending:  Chancy Hurter CC:         Gloris Ham, M.D. [facility]   Consultation Report  REASON FOR CONSULTATION:  Asked by Dr. Loanne Drilling to see [name], who is a 75 year old African-American female, to evaluate nausea and abdominal pain.  HISTORY OF PRESENT ILLNESS:  [name] is well-known to me because of GI problems over the past 10 years.  These have included diabetic gastroparesis, multiple episodes of Candida esophagitis, constipation-predominant irritable bowel syndrome, diverticulosis, cholelithiasis, and colon adenoma.  Her current admission was precipitated by the onset of nausea and vomiting one week ago.  This was accompanied by lower quadrant abdominal pain.  Initially this was poorly localized but subsequently manifest primarily in the left lower quadrant, radiating into the left flank.  She was admitted to the hospital after being seen the prior day in the emergency room and treated with IV hydration.  Following admission, a presumptive diagnosis of acute diverticulitis was made.  The patient was treated with IV Flagyl and Cipro, which was continued for five days, being discontinued only yesterday.  The patient had known diverticulosis on the basis of multiple prior colonoscopies, most recent in 1998, and prior barium enema.  No prior episode of diverticulitis.  Further evaluation during her current hospital course included abdominal CT, ultrasound, and HIDA scan.  These confirmed the presence of diverticular disease but no evidence of acute inflammation. Cholelithiasis was also noted, but again no evidence of acute inflammation/ infection.  The patient did not manifest the leukocytosis.  However, she has had low-grade fevers with diaphoresis.  These have been trending down on antibiotic  therapy.  Patient does have mild symptoms of constipation returning.  During the initial illness, bowels were watery, frequent, with associated cramping.  She denies hematemesis.  PAST MEDICAL HISTORY: 1. Adult onset diabetes. 2. Hypertension. 3. Hyperlipidemia. 4. Candida esophagitis. 5. Colon adenoma. 6. Constipation-predominant irritable bowel syndrome. 7. Cholelithiasis. 8. Asthmatic bronchitis.  MEDICATIONS:  Medical regimen prior to admission:  Actos, Glucophage, inhaler, atenolol 25 mg daily, Norvasc 5 mg daily, Diovan 160 mg b.i.d., furosemide 40 mg daily, K-Dur b.i.d., insulin 80/10, insulin 30.  Phenergan q.6h. p.r.n.  REVIEW OF SYSTEMS:  Overall health fair.  Energy good prior to current illness.  Chronic shortness of breath, occasional intermittent wheezing. Sinus congestion.  Occasional palpitation, night sweats.  GYNECOLOGIC: Gravida 5, para 5, menopausal.  GASTROINTESTINAL:  Symptoms of mild dysphagia at the base of the throat.  No regurgitation, pyrosis.  SOCIAL/PERSONAL HISTORY:  Born in [place], raised in [place]. Living alone in an apartment, widowed.  Retired.  No substance abuse.  High school graduate.  Minimal exercise.  Religion:  [employer].  FAMILY HISTORY:  Parents deceased, uncertain medical history.  Sisters 62, 57, healthy.  Brothers 50 and 20, healthy.  Four daughters, one son, all healthy. Ten grandchildren, healthy.  PHYSICAL EXAMINATION:  GENERAL:  Obese African-American female, alert and oriented x 3.  HEENT:  Anicteric sclerae, pink conjunctivae.  No oropharyngeal lesion.  No evidence of pharyngeal exudate.  No lesion of lip, gums, or tongue.  NECK:  Supple, without adenopathy.  CHEST:  Diffuse mild expiratory wheeze.  Good air movement.  No consolidation or other  adventitious sounds.  CARDIAC:  Regular rhythm, no gallop, rub, or murmur.  ABDOMEN:  Obese, soft, nondistended.  Tender left lower quadrant.  No rebound, no  guarding, no firmness, no mass, no organomegaly.  Bowel sounds active without borborygmi, bruit, or splash.  RECTAL:  Not performed.  EXTREMITIES:  Without clubbing, cyanosis, or edema.  DIAGNOSTIC STUDIES:  Laboratory reviewed.  CMET normal except glucose 122, potassium 3.4.  Albumin 2.9 (May 01, 2001).  Stool Hemoccult negative.  A CBC abnormal for hemoglobin 10.7, hematocrit 31.3, MCV at 79, MCHC 34.3. Transferrin saturation 8%, normal would be _____.  Radiology:  Abdominal flat plate unremarkable.  Chest x-ray:  Cardiomegaly with mild failure, small effusion.  HIDA scan normal.  Ultrasound:  Small cholelithiasis without acute inflammation.  CT:  Probable small gallstones. Probable hiatal hernia.  Possible hepatic cyst.  Pelvic:  No evidence of diverticulitis.  ASSESSMENT: 1. Acute diverticulitis.  Strongly suggested by the patients presentation    with altered bowel movements, lower abdominal pain radiating to the left    flank, response to five-day course of Flagyl/Cipro, and physical exam today    with left lower quadrant tenderness.  This all appears to be improving.    The lack of findings on abdominal CT and WBC at upper limit of normal but    not elevated do not strongly steer away from this diagnosis.  I think the    patient should receive a seven to 10-day course of antibiotic therapy given    the severity of her disease, associated comorbid conditions, especially    diabetes. 2. Diabetic gastroparesis.  This does not explain the presenting symptoms on    admission.  However, this well may have been exacerbated by her acute    infection and may be adding to the nausea, vomiting which she is    experiencing.  I think this is secondary, however, to the acute    diverticulitis. 3. Cholelithiasis, asymptomatic.  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 4. Iron deficiency.  This is new and of uncertain  etiology.  Hemoccults are    negative.  Doubt gastrointestinal neoplasia with extensive evaluation     during the past decade with multiple endoscopies, colonoscopies, and barium    enema.  Patient is due for a follow-up colonoscopy June 2003.  I would    not pursue further GI evaluation presently but would simply replace iron    when she is more stable from a GI standpoint.  RECOMMENDATION: 1. Resume IV Cipro/Flagyl for an additional three to five days. 2. Advance diet as tolerated. 3. Follow up as an outpatient for anticipated repeat colonoscopy June 2003. DD:  05/02/01 TD:  05/03/01 Job: [id number]

## 2011-02-17 NOTE — H&P (Signed)
[place]. [facility]  Patient:    [name], [name]                            MRN: [id number] Adm. Date:  04/27/01 Attending:  Standley Brooking. Panosh, M.D. [facility]                         History and Physical  INCOMPLETE  DATE OF BIRTH: 01/09/1936  PRIMARY CARE PHYSICIAN: Gloris Ham, M.D. DD:  04/27/01 TD:  04/27/01 Job: [id number]

## 2011-02-17 NOTE — H&P (Signed)
[place]. [facility]  Patient:    [name], [name]                            MRN: [id number] Adm. Date:  04/27/01 Attending:  Standley Brooking. Panosh, M.D. [facility]                         History and Physical  DATE OF BIRTH: August 03, 1936  PRIMARY CARE PHYSICIAN Gloris Ham, M.D.  S.S.#: [id number]  CHIEF COMPLAINT: Nausea, vomiting, abdominal pain.  HISTORY OF PRESENT ILLNESS: Ms. Cryer is a 75 year old ex-smoker, widowed African-American female, who carries a diagnosis of diabetes, hypertension, and asthma, who was in her usual state of health until about five days before admission.  She developed some nausea and vomiting and then some abdominal discomfort with occasional diarrhea.  She went to the [facility] Emergency Room on April 25, 2001 and, according to her, was treated with IV fluids, some type of small pain pill, and maybe some other medication. She was asked if she could have diverticulitis.  There were no x-rays done. She was told to come back if not improved.  She presented in the outpatient clinic today with continued symptoms; however, now she has severe left lower back pain and her abdominal pain may be worse.  She has continued to vomit and retain little p.o. fluids.  There is no blood in her stool.  She has no fever now but she had some fever at onset of illness.  PAST MEDICAL HISTORY: Taken from patient.  No chart available.  1. Hysterectomy.  2. Admissions for asthma in the past, none in five or six years.  3. Hospitalized last fall for vomiting, nausea, and diarrhea; question     diagnosis.  4. Cataract, left eye.  She denies any other surgeries.  MEDICATIONS:  She does not have her list today, but believes she is on:  1. Actos.  2. Glucophage for diabetes.  3. Some typical of inhaler for her asthma.  4. P.r.n. Medrol, none recently.  5. Stomach pill.  6. Something for hypertension.  ALLERGIES:  ASPIRIN.  FAMILY HISTORY: Positive for diabetes, stroke, cardiovascular disease. Negative GI except a sister who had some type of problem and ended up with a colostomy.  REVIEW OF SYSTEMS: Negative for chest pain.  No cough.  Question if her asthma is flaring.  Malaise, fatigue, weak feeling.  No change in her vision.  GU: Negative.  MUSCULOSKELETAL: Left lower back pain.  NEUROLOGIC: No focal symptoms.  SKIN: No acute changes.  The rest of the ROS is negative or noncontributory.  SOCIAL HISTORY: She is a widow and lives at home.  She stopped smoking a number of years ago.  No alcohol.  Has family members nearby.  Her daughter checks on her a lot.  PHYSICAL EXAMINATION:  VITAL SIGNS: Weight 191 pounds.  Temperature 97.9 degrees, pulse 102 and regular, respirations 26, blood pressure 160/80.  GENERAL: This is a well-developed, well-nourished ill-appearing lady, in mild distress, intermittently vomiting.  HEENT: Normocephalic.  TMs clear.  Eyes, PERRL.  Sclerae nonicteric.  OP clear.  Moist mucous membranes.  NECK: Supple without masses, thyromegaly, or bruits.  CHEST: Slight decrease in breath sounds and rare wheeze expiratory, otherwise clear.  CARDIOVASCULAR: PMI nondisplaced.  S1 and S2.  No gallops.  A 1/6 systolic ejection murmur is heard.  Peripheral pulses  present without delay.  Negative edema.  ABDOMEN: No fluid wave.  Bowel sounds present but decreased.  Tenderness in left lower mid quadrant.  There is slight guarding in the left lower quadrant. No rebound.  No masses.  Negative flank pain.  There is some tenderness in the left lower back area.  LYMPH NODES: Negative.  MUSCULOSKELETAL: Difficult to evaluate but no obvious gross deficits.  SKIN: No acute changes, nonicteric.  NEUROLOGIC: Moves all extremities.  No focal deficits.  Able to ambulate with assistance but is somewhat weak.  LABORATORY DATA: CBG in the office is 134.  She was also given Phenergan  25 mg IM x 1 and had persistent vomiting.  IMPRESSION:  1. Acute vomiting, abdominal pain, and dehydration; possible diverticulitis;     rule out other acute infectious process.  2. Diabetes.  3. Hypertension.  4. Asthma.  PLAN:  1. Admit.  2. IV fluids.  3. Abdominal CT.  4. Further evaluation. DD:  04/27/01 TD:  04/27/01 Job: [id number]

## 2011-02-17 NOTE — Discharge Summary (Signed)
[place]. [facility]  Patient:    [name], [name]                          MRN: [id number] Adm. Date:  [id number] Disc. Date: [id number] Attending:  Gloris Ham Dictator:   Helayne Seminole, P.A. CC:         Coralie Keens, M.D.  Gloris Ham, M.D. [facility]   Discharge Summary  DISCHARGE DIAGNOSES: 1. Fever. 2. Leukocytosis. 3. Cholelithiasis. 4. Abdominal pain.  BRIEF ADMISSION HISTORY:  [name] is a 75 year old African-American female who presented to the emergency department after onset of vomiting around 4 p.m. on September 17, 2000.  She complained of nausea and lower abdominal cramping.  She has had three loose stools.  She denies any fever, but does admit to chills.  She denied any dysuria.  She also admitted to a cough productive of yellow sputum x 2 days.  PAST MEDICAL HISTORY:  The past medical history is limited:  1. Adult onset diabetes mellitus since 1992.  2. Hypertension.  3. Gastroesophageal reflux disease followed by Mayme Genta, M.D.  4. Asthma.  5. Status post partial hysterectomy.  LABORATORIES ON ADMISSION:  Chest x-ray, two view, was stable with mild cardiac enlargement, but no active disease.  A CT of the abdomen and pelvis showed stable cyst of the liver with one gallstone and nothing acute.  The SGOT was elevated at 4.6.  The total bilirubin was elevated at 1.9.  The white count was 18.8 with 96% neutrophils.  The urinalysis was negative.  IMPRESSION:  Infectious disease.  HOSPITAL COURSE:  The patient presented with fever and leukocytosis associated with intractable nausea, vomiting, and evidence of cholelithiasis.  The patient was empirically started on Unasyn and we did get a HIDA scan.  We also asked for a surgical consult.  The HIDA scan showed no evidence of cholecystitis with good gallbladder filling and emptying.  After a day of antibiotics, her white count normalized and her fever resolved.  The  patient received two days of Unasyn and then this was discontinued.  The patients symptoms continued to improve.  It was not clear if this was a gastroenteritis versus mild diverticular episode.  Her symptoms have resolved and no further work-up has been pursued at this time.  LABORATORIES AT DISCHARGE:  White count 8.7.  Total bilirubin still 10.9.  MEDICATIONS AT DISCHARGE:  She is to resume the medications that she was on at home prior to admission.  The patient did not have a list of her medications when she presented to the emergency department.  We did obtain a list from the office and these are what we have listed from the office:  1. Medrol 4 mg q.d. 2. Premarin 0.625 mg q.d.  3. Diovan 320 mg q.d.  4. Lasix 40 mg q.d. 5. K-Dur 40 mEq b.i.d.  6. Atenolol 25 mg q.d.  7. Actos 45 mg q.d. 8. Claritin 10 mg q.d.  9. Singulair 10 mg q.d.  10. Flovent two puffs b.i.d. 11. Serevent two puffs b.i.d.  12. Nasacort two puffs q.d.  13. Norvasc 10 mg q.d.  14. Catapres at what looks like a 0.2 mg patch.  15. Aciphex 20 mg q.d. 16. Humalog Insulin 30, 25, and 30 with meals.  FOLLOW-UP:  Follow up with Gloris Ham, M.D., in two to three weeks.DD: 09/20/00 TD:  09/21/00 Job: [id number]

## 2011-03-17 ENCOUNTER — Encounter: Payer: Self-pay | Admitting: Internal Medicine

## 2011-03-22 ENCOUNTER — Encounter: Payer: Self-pay | Admitting: Internal Medicine

## 2011-03-22 ENCOUNTER — Ambulatory Visit (INDEPENDENT_AMBULATORY_CARE_PROVIDER_SITE_OTHER): Payer: Medicare Other | Admitting: Internal Medicine

## 2011-03-22 VITALS — BP 130/68 | HR 77 | Temp 98.6°F | Ht 65.5 in | Wt 209.0 lb

## 2011-03-22 DIAGNOSIS — J45909 Unspecified asthma, uncomplicated: Secondary | ICD-10-CM

## 2011-03-22 MED ORDER — BUDESONIDE-FORMOTEROL FUMARATE 80-4.5 MCG/ACT IN AERO: 2.0000 | INHALATION_SPRAY | Freq: Two times a day (BID) | RESPIRATORY_TRACT | Status: DC

## 2011-03-22 MED ORDER — ALBUTEROL SULFATE HFA 108 (90 BASE) MCG/ACT IN AERS: 1.0000 | INHALATION_SPRAY | Freq: Four times a day (QID) | RESPIRATORY_TRACT | Status: DC | PRN

## 2011-03-22 NOTE — Patient Instructions (Signed)
If your breathing worsens or you need to use your rescue inhaler (albuterol)  more than twice weekly or wake up more than twice a month with any respiratory symptoms or require more than two rescue inhalers per year, we need to see you right away because this means we're not controlling the underlying problem (inflammation) adequately.  If resting improves your breathing just as fast using your rescue inhaler, don't use your rescue inhaler  Rescue inhalers do not control inflammation and overuse can lead to unnecessary and costly consequences.  They can make you feel better temporarily but eventually they will quit working effectively much as sleep aids lead to more insomnia if used regularly.   Please schedule a follow up visit in 12 months but call sooner if needed (see or Tammy NP)

## 2011-03-22 NOTE — Progress Notes (Signed)
Subjective:     Patient ID: [name], female   DOB: 11/10/1935, 75 y.o.   MRN: [id number]  HPI  44  yobf quit smoking 1988 with h/o asthma  And nl baseline pfts ( as of 01/04/2010) who had been using Advair on a p.r.n. basis and noticing increasing dyspnea and need for rescue therapy x one mo when seen 01/06/08 for pulmonary evaluation  so Advair stopped. Began Symbicort 160/4.1mcg 2 puffs two times a day.  Returned 02/13/08 improved with decreased dyspnea and no rescue use   Returned 03/18/08 symptom free no rescue needed, stopped reglan, no flare  November 23, 2009 Followup. Pt states that she had PNA in Jan 2011 and since then has had increased SOB. She states that SOB is only with exertion. She gets SOB walking long distances. She also c/o dry cough x several months. not compliant with symbicort. rec symbicort 160 and work on technique   January 04, 2010 cc her breathing has improved since last seen. She still has occ SOB with exertion "if walks a long way". She c/o "hacking cough" that she relates to having allergies tends to be worse in spring day > night mild itching sneezing runny nose    rec 1) Try lower strength symbicort 80 2 puffs first thing in am and 2 puffs again in pm about 12 hours later and fill the prescription if do as well (since irritates upper airway less at the lower strength 2) If your breathing worsens or you need to use your rescue inhaler (ventolin or nebulized albuterol) more than twice weekly or wake up more than twice a month with any respiratory symptoms or require more than two rescue inhalers per year, we need to see you right away.  3) for allergies try zyrtec or chlortrimeton otc as needed instead of benadryl  03/22/11 ov/[name] cc cough and sob good control on lower dose symbicort, only having problems when get out in heat.  Sleeping ok without nocturnal  or early am exac of resp c/o's or need for noct saba. Pt denies any significant sore throat, dysphagia, itching,  sneezing,  nasal congestion or excess/ purulent secretions,  fever, chills, sweats, unintended wt loss, pleuritic or exertional cp, hempoptysis, orthopnea pnd or leg swelling.    Also denies any obvious fluctuation of symptoms with weather or environmental changes or other aggravating or  alleviating factors.     Allergies  1) ! * Altace  2) ! * Asprin     Past Medical History:   OSTEOPOROSIS (ICD-733.00)  HYPERTENSION (ICD-401.9)  HYPERLIPIDEMIA (ICD-272.4)  DIVERTICULITIS, HX OF (ICD-V12.79)  DIABETES MELLITUS, TYPE II (ICD-250.00)  COLON CANCER, HX OF (ICD-V10.05)  ASTHMA (ICD-493.90)  -PFT's 03/18/08 minimal airflow obstruction  -PFT's January 04, 2010 No sign airflow obstruction  -Mastered HFA technique August 13, 2008 > confimed November 23, 2009  ANXIETY (ICD-300.00)  ANEMIA-IRON DEFICIENCY (ICD-280.9)  ALLERGIC RHINITIS (ICD-477.9)  IBS  GERD      Review of Systems     Objective:   Physical Exam   wt 198 > 202 November 23, 2009> 199 January 04, 2010 > 209  03/22/11 HEENT mild turbinate edema. Oropharynx no thrush or excess pnd or cobblestoning. No JVD or cervical adenopathy. Mild accessory muscle hypertrophy. Trachea midline, nl thryroid. Chest was hyperinflated by percussion with diminished breath sounds and moderate increased exp time without wheeze. Hoover sign positive at mid inspiration. Regular rate and rhythm without murmur gallop or rub or increase P2 or edema. Abd:  no hsm, nl excursion. Ext warm without cyanosis or clubbing.        Assessment:          Plan:

## 2011-03-24 ENCOUNTER — Encounter: Payer: Self-pay | Admitting: Internal Medicine

## 2011-03-24 NOTE — Assessment & Plan Note (Signed)
All goals of chronic asthma control met including optimal function and elimination of symptoms with minimal need for rescue therapy.  Contingencies discussed in full including contacting this office immediately if not controlling the symptoms using the rule of two's.     See instructions for specific recommendations which were reviewed directly with the patient who was given a copy with highlighter outlining the key components.  

## 2011-03-30 ENCOUNTER — Telehealth: Payer: Self-pay | Admitting: Internal Medicine

## 2011-03-30 NOTE — Telephone Encounter (Signed)
Spoke with pt. She states rx we sent for ventolin not covered by her ins. I called and spoke with pharmacist and they advised should try calling in proair instead, and then wait a while so they can fill rx and call them back to see if this is going to be covered. I phoned in rx for proair. WCB later to make sure ins will cover this.

## 2011-03-30 NOTE — Telephone Encounter (Signed)
Spoke with pharmacist Alex at [facility]. He states that proair was covered with copay of $6. Spoke with pt and notified her of this and she verbalized understanding. Will pick up the rx for proair.

## 2011-04-24 ENCOUNTER — Other Ambulatory Visit: Payer: Self-pay | Admitting: Endocrinology

## 2011-04-25 ENCOUNTER — Telehealth: Payer: Self-pay | Admitting: *Deleted

## 2011-04-25 MED ORDER — ALBUTEROL SULFATE HFA 108 (90 BASE) MCG/ACT IN AERS: 2.0000 | INHALATION_SPRAY | Freq: Four times a day (QID) | RESPIRATORY_TRACT | Status: DC | PRN

## 2011-04-25 NOTE — Telephone Encounter (Signed)
Ventolin HFA is not covered by the pt's insurance. Will send new rx for Proair to the pt's pharmacy.

## 2011-04-28 ENCOUNTER — Telehealth: Payer: Self-pay | Admitting: Internal Medicine

## 2011-04-28 MED ORDER — ALBUTEROL SULFATE HFA 108 (90 BASE) MCG/ACT IN AERS: 2.0000 | INHALATION_SPRAY | Freq: Four times a day (QID) | RESPIRATORY_TRACT | Status: DC | PRN

## 2011-04-28 NOTE — Telephone Encounter (Signed)
Ventolin HFA changed to Proair HFA due to insurance coverage.

## 2011-05-31 ENCOUNTER — Other Ambulatory Visit: Payer: Self-pay | Admitting: Endocrinology

## 2011-06-29 ENCOUNTER — Telehealth: Payer: Self-pay | Admitting: *Deleted

## 2011-06-29 NOTE — Telephone Encounter (Signed)
Spoke w/pt patient - pt c/o sweats, left sided neck and arm pain, headache and SOB (when walking) x 2 days. Advised ER now, pt agreed.

## 2011-07-04 LAB — COMPREHENSIVE METABOLIC PANEL
Albumin: 3.7 g/dL (ref 3.5–5.2)
BUN: 10 mg/dL (ref 6–23)
Calcium: 9.1 mg/dL (ref 8.4–10.5)
Creatinine, Ser: 1.2 mg/dL (ref 0.4–1.2)
GFR calc Af Amer: 54 mL/min — ABNORMAL LOW (ref >60)
Total Bilirubin: 0.8 mg/dL (ref 0.3–1.2)
Total Protein: 6.9 g/dL (ref 6.0–8.3)

## 2011-07-04 LAB — CBC
HCT: 33.5 % — ABNORMAL LOW (ref 36.0–46.0)
MCHC: 33.4 g/dL (ref 30.0–36.0)
MCV: 82.6 fL (ref 78.0–100.0)
Platelets: 164 10*3/uL (ref 150–400)
RDW: 17.2 % — ABNORMAL HIGH (ref 11.5–15.5)

## 2011-07-04 LAB — DIFFERENTIAL
Basophils Absolute: 0.1 10*3/uL (ref 0.0–0.1)
Lymphocytes Relative: 24 % (ref 12–46)
Lymphs Abs: 1.4 10*3/uL (ref 0.7–4.0)
Monocytes Absolute: 0.4 10*3/uL (ref 0.1–1.0)
Monocytes Relative: 8 % (ref 3–12)
Neutro Abs: 3.5 10*3/uL (ref 1.7–7.7)

## 2011-07-06 ENCOUNTER — Encounter: Payer: Self-pay | Admitting: *Deleted

## 2011-07-06 ENCOUNTER — Other Ambulatory Visit (INDEPENDENT_AMBULATORY_CARE_PROVIDER_SITE_OTHER): Payer: Medicare Other

## 2011-07-06 ENCOUNTER — Ambulatory Visit (INDEPENDENT_AMBULATORY_CARE_PROVIDER_SITE_OTHER): Payer: Medicare Other | Admitting: Endocrinology

## 2011-07-06 DIAGNOSIS — Z79899 Other long term (current) drug therapy: Secondary | ICD-10-CM

## 2011-07-06 DIAGNOSIS — F411 Generalized anxiety disorder: Secondary | ICD-10-CM

## 2011-07-06 DIAGNOSIS — N390 Urinary tract infection, site not specified: Secondary | ICD-10-CM

## 2011-07-06 DIAGNOSIS — D509 Iron deficiency anemia, unspecified: Secondary | ICD-10-CM

## 2011-07-06 DIAGNOSIS — R079 Chest pain, unspecified: Secondary | ICD-10-CM

## 2011-07-06 DIAGNOSIS — I10 Essential (primary) hypertension: Secondary | ICD-10-CM

## 2011-07-06 DIAGNOSIS — E119 Type 2 diabetes mellitus without complications: Secondary | ICD-10-CM

## 2011-07-06 DIAGNOSIS — M810 Age-related osteoporosis without current pathological fracture: Secondary | ICD-10-CM

## 2011-07-06 LAB — VITAMIN B12: Vitamin B-12: 266 pg/mL (ref 211–911)

## 2011-07-06 LAB — URINALYSIS, ROUTINE W REFLEX MICROSCOPIC
Leukocytes, UA: NEGATIVE
Nitrite: NEGATIVE
Specific Gravity, Urine: 1.015 (ref 1.000–1.030)
pH: 6 (ref 5.0–8.0)

## 2011-07-06 LAB — CBC WITH DIFFERENTIAL/PLATELET
Basophils Absolute: 0 10*3/uL (ref 0.0–0.1)
Hemoglobin: 11.8 g/dL — ABNORMAL LOW (ref 12.0–15.0)
Lymphocytes Relative: 26.7 % (ref 12.0–46.0)
Monocytes Relative: 7 % (ref 3.0–12.0)
Platelets: 182 10*3/uL (ref 150.0–400.0)
RDW: 17.8 % — ABNORMAL HIGH (ref 11.5–14.6)

## 2011-07-06 LAB — BASIC METABOLIC PANEL
CO2: 23 mEq/L (ref 19–32)
Calcium: 9.5 mg/dL (ref 8.4–10.5)
Creatinine, Ser: 1.4 mg/dL — ABNORMAL HIGH (ref 0.4–1.2)

## 2011-07-06 LAB — HEPATIC FUNCTION PANEL
AST: 24 U/L (ref 0–37)
Bilirubin, Direct: 0.1 mg/dL (ref 0.0–0.3)
Total Bilirubin: 0.9 mg/dL (ref 0.3–1.2)

## 2011-07-06 LAB — TSH: TSH: 1.77 u[IU]/mL (ref 0.35–5.50)

## 2011-07-06 LAB — IBC PANEL
Saturation Ratios: 21.9 % (ref 20.0–50.0)
Transferrin: 286.8 mg/dL (ref 212.0–360.0)

## 2011-07-06 LAB — MICROALBUMIN / CREATININE URINE RATIO
Creatinine,U: 151.1 mg/dL
Microalb Creat Ratio: 0.2 mg/g (ref 0.0–30.0)

## 2011-07-06 LAB — LIPID PANEL: Total CHOL/HDL Ratio: 3

## 2011-07-06 NOTE — Patient Instructions (Addendum)
Let's do a "treadmill" test.  you will receive a phone call, about a day and time for an appointment. Also, blood tests are being requested for you today.  please call [phone] to hear your test results.  You will be prompted to enter the 9-digit "MRN" number that appears at the top left of this page, followed by #.  Then you will hear the message. Please schedule a regular physical soon. (update: i left message on phone-tree:  Chest ct)

## 2011-07-06 NOTE — Progress Notes (Signed)
Subjective:    Patient ID: [name], female    DOB: 19-Apr-1936, 75 y.o.   MRN: [id number]  HPI Last week, pt had an episode of pain rad from the chest to the left arm, lasting 1 hour.  She had assoc diaphoresis.  she says she had a treadmill a few years ago, but none is in centricity.   no cbg record, but states cbg's are well-controlled Past Medical History  Diagnosis Date  . Anterior chest wall pain   . Cough   . UTI (urinary tract infection)   . HTN (hypertension)   . Hyperlipidemia   . History of diverticulitis of colon   . DM type 2 (diabetes mellitus, type 2)   . History of colon cancer   . Asthma   . Anxiety   . Iron deficiency anemia   . Allergic rhinitis   . IBS (irritable bowel syndrome)   . GERD (gastroesophageal reflux disease)   . Osteoporosis, unspecified     Past Surgical History  Procedure Date  . Esophagogastroduodenoscopy 12/26/01  . Cardiovascular stress test 02/25/04  . Right oophorectomy jan 2010    History   Social History  . Marital Status: Widowed    Spouse Name: N/A    Number of Children: N/A  . Years of Education: N/A   Occupational History  . retired    Social History Main Topics  . Smoking status: Former Smoker -- 0.3 packs/day for 5 years    Types: Cigarettes    Quit date: 10/02/1986  . Smokeless tobacco: Never Used  . Alcohol Use: No  . Drug Use: No  . Sexually Active: Not on file   Other Topics Concern  . Not on file   Social History Narrative  . No narrative on file    Current Outpatient Prescriptions on File Prior to Visit  Medication Sig Dispense Refill  . albuterol (PROAIR HFA) 108 (90 BASE) MCG/ACT inhaler Inhale 2 puffs into the lungs every 6 (six) hours as needed for wheezing.  1 Inhaler  5  . amLODipine (NORVASC) 10 MG tablet TAKE 1/2 TABLET DAILY  30 tablet  2  . budesonide-formoterol (SYMBICORT) 80-4.5 MCG/ACT inhaler Inhale 2 puffs into the lungs 2 (two) times daily.  1 Inhaler  11  .  dextromethorphan-guaiFENesin (MUCINEX DM) 30-600 MG per 12 hr tablet Take 1-2 tablets by mouth every 12 (twelve) hours as needed.       Marland Kitchen HYDROcodone-acetaminophen (NORCO) 5-325 MG per tablet 1/2 - 1 tab by mouth every 4 hours as needed for pain       . losartan-hydrochlorothiazide (HYZAAR) 50-12.5 MG per tablet Take 1 tablet by mouth daily.        . montelukast (SINGULAIR) 10 MG tablet Take 1 tablet (10 mg total) by mouth at bedtime.  30 tablet  11  . NOVOLOG 100 UNIT/ML injection THREE TIMES A DAY (BEFORE EVERY MEAL) 25-20-40 UNITS.  10 mL  2  . pravastatin (PRAVACHOL) 40 MG tablet TAKE 1 TABLET BY MOUTH AT BEDTIME  90 tablet  1    Allergies  Allergen Reactions  . Aspirin   . Ramipril     REACTION: Cough   Family History  Problem Relation Age of Onset  . Atopy Neg Hx   . Asthma Neg Hx   . Lung cancer Brother   . Melanoma Brother    BP 132/70  Pulse 77  Temp(Src) 99.1 F (37.3 C) (Oral)  Ht 5\' 7"  (1.702 m)  Wt  205 lb (92.987 kg)  BMI 32.11 kg/m2  SpO2 96%  Review of Systems Denies loc and sob    Objective:   Physical Exam VITAL SIGNS:  See vs page GENERAL: no distress Chest wall: nontnender LUNGS:  Clear to auscultation HEART: Regular rate and rhythm without murmurs noted. Normal S1,S2.     i reviewed electrocardiogram D-dimer is elevated    Assessment & Plan:  Chest pain, uncertain etiology Abnormal resting ecg, so ecg treadmill won't be useful. Dm, apparently well-controlled

## 2011-07-07 ENCOUNTER — Ambulatory Visit (INDEPENDENT_AMBULATORY_CARE_PROVIDER_SITE_OTHER)
Admission: RE | Admit: 2011-07-07 | Discharge: 2011-07-07 | Disposition: A | Discharge status: Home / Self Care | Payer: Medicare Other | Source: Ambulatory Visit | Attending: Cardiovascular Disease | Admitting: Cardiovascular Disease

## 2011-07-07 DIAGNOSIS — R079 Chest pain, unspecified: Secondary | ICD-10-CM

## 2011-07-07 LAB — BASIC METABOLIC PANEL
BUN: 11
BUN: 24 — ABNORMAL HIGH
BUN: 5 — ABNORMAL LOW
CO2: 21
CO2: 22
Calcium: 8.3 — ABNORMAL LOW
Calcium: 8.8
Chloride: 106
Chloride: 111
Creatinine, Ser: 1.18
Creatinine, Ser: 1.35 — ABNORMAL HIGH
Creatinine, Ser: 2.02 — ABNORMAL HIGH
GFR calc Af Amer: 55 — ABNORMAL LOW
GFR calc non Af Amer: 24 — ABNORMAL LOW
Glucose, Bld: 158 — ABNORMAL HIGH
Potassium: 3.3 — ABNORMAL LOW

## 2011-07-07 LAB — I-STAT 8, (EC8 V) (CONVERTED LAB)
BUN: 28 — ABNORMAL HIGH
Bicarbonate: 20.5
Chloride: 100
Glucose, Bld: 530
HCT: 38
Hemoglobin: 12.9
Operator id: 294521
Potassium: 4.1
Sodium: 129 — ABNORMAL LOW

## 2011-07-07 LAB — CBC
HCT: 31.1 — ABNORMAL LOW
HCT: 31.8 — ABNORMAL LOW
MCHC: 32.4
MCHC: 34
MCV: 78
MCV: 80.5
Platelets: 163
Platelets: 185
RBC: 4.07
RDW: 15.2
RDW: 15.4
WBC: 10.2
WBC: 4.4

## 2011-07-07 LAB — URINALYSIS, ROUTINE W REFLEX MICROSCOPIC
Nitrite: NEGATIVE
Protein, ur: NEGATIVE
Specific Gravity, Urine: 1.021
Urobilinogen, UA: 1

## 2011-07-07 LAB — DIFFERENTIAL
Basophils Absolute: 0
Basophils Relative: 0
Eosinophils Absolute: 0.2
Monocytes Absolute: 0.6
Neutro Abs: 6.2
Neutrophils Relative %: 75

## 2011-07-07 LAB — URINE MICROSCOPIC-ADD ON

## 2011-07-07 LAB — POCT I-STAT CREATININE
Creatinine, Ser: 2.1 — ABNORMAL HIGH
Operator id: 294521

## 2011-07-07 LAB — GLUCOSE, CAPILLARY: Glucose-Capillary: 218 mg/dL — ABNORMAL HIGH (ref 70–99)

## 2011-07-07 LAB — URINE CULTURE

## 2011-07-07 LAB — HEMOGLOBIN A1C
Hgb A1c MFr Bld: 6.5 % (ref 4.6–6.5)
Hgb A1c MFr Bld: 11 — ABNORMAL HIGH

## 2011-07-07 LAB — PTH, INTACT AND CALCIUM
Calcium, Total (PTH): 10.2 mg/dL (ref 8.4–10.5)
PTH: 79.5 pg/mL — ABNORMAL HIGH (ref 14.0–72.0)

## 2011-07-07 LAB — TYPE AND SCREEN

## 2011-07-07 MED ORDER — IOHEXOL 300 MG/ML  SOLN
80.0000 mL | Freq: Once | INTRAMUSCULAR | Status: AC | PRN
  Administered 2011-07-07: 80 mL via INTRAVENOUS

## 2011-07-13 ENCOUNTER — Ambulatory Visit (INDEPENDENT_AMBULATORY_CARE_PROVIDER_SITE_OTHER): Payer: Medicare Other | Admitting: Internal Medicine

## 2011-07-13 ENCOUNTER — Encounter: Payer: Self-pay | Admitting: Internal Medicine

## 2011-07-13 VITALS — BP 177/77 | HR 81 | Ht 67.0 in | Wt 208.0 lb

## 2011-07-13 DIAGNOSIS — E785 Hyperlipidemia, unspecified: Secondary | ICD-10-CM

## 2011-07-13 DIAGNOSIS — I2510 Atherosclerotic heart disease of native coronary artery without angina pectoris: Secondary | ICD-10-CM

## 2011-07-13 DIAGNOSIS — R61 Generalized hyperhidrosis: Secondary | ICD-10-CM

## 2011-07-13 DIAGNOSIS — I10 Essential (primary) hypertension: Secondary | ICD-10-CM

## 2011-07-13 LAB — BASIC METABOLIC PANEL
BUN: 17 mg/dL (ref 6–23)
Calcium: 9.3 mg/dL (ref 8.4–10.5)
Creatinine, Ser: 1.2 mg/dL (ref 0.4–1.2)
GFR: 55.53 mL/min — ABNORMAL LOW (ref >60.00)
Glucose, Bld: 119 mg/dL — ABNORMAL HIGH (ref 70–99)

## 2011-07-13 NOTE — Progress Notes (Signed)
HPI Patient is a 75 year old who was referred for evalution of "hot senstaion. The patient said one night a couple wks ago she went to pick up her grand daughter.  She became really hot (did say it was a warm night), she was sweating.  She did not  have chest pain.  Her breathing was OK.  She got home  Turned on [facility].  Cooled down.  Did note that her head hurt, L shoulder and L neck. She went to Urgent care.  Conclusion was, by her report, that it was not her heart. She has not had another spell since. No slowing.  No chest pain or SOB with activity.  She does have asthma and notes occasional wheezing.  But, no chest pain  She was seen by Cordelia Pen.  Of note she had a CT scan that showed no PE.  It was noted that she had mild coronary calcification.  Allergies  Allergen Reactions  . Aspirin   . Ramipril     REACTION: Cough    Current Outpatient Prescriptions  Medication Sig Dispense Refill  . albuterol (PROAIR HFA) 108 (90 BASE) MCG/ACT inhaler Inhale 2 puffs into the lungs every 6 (six) hours as needed for wheezing.  1 Inhaler  5  . amLODipine (NORVASC) 10 MG tablet TAKE 1/2 TABLET DAILY  30 tablet  2  . budesonide-formoterol (SYMBICORT) 80-4.5 MCG/ACT inhaler Inhale 2 puffs into the lungs 2 (two) times daily.  1 Inhaler  11  . dextromethorphan-guaiFENesin (MUCINEX DM) 30-600 MG per 12 hr tablet Take 1-2 tablets by mouth every 12 (twelve) hours as needed.       Marland Kitchen glucose blood (ONE TOUCH ULTRA TEST) test strip Use as instructed three times daily       . HYDROcodone-acetaminophen (NORCO) 5-325 MG per tablet 1/2 - 1 tab by mouth every 4 hours as needed for pain       . Insulin Syringe-Needle U-100 (BD INSULIN SYRINGE ULTRAFINE) 31G X 5/16" 0.3 ML MISC Use as directed       . losartan-hydrochlorothiazide (HYZAAR) 50-12.5 MG per tablet Take 1 tablet by mouth daily.        . montelukast (SINGULAIR) 10 MG tablet Take 1 tablet (10 mg total) by mouth at bedtime.  30 tablet  11  . NOVOLOG 100  UNIT/ML injection THREE TIMES A DAY (BEFORE EVERY MEAL) 25-20-40 UNITS.  10 mL  2  . ONE TOUCH LANCETS MISC Use as directed three times daily       . pravastatin (PRAVACHOL) 40 MG tablet TAKE 1 TABLET BY MOUTH AT BEDTIME  90 tablet  1    Past Medical History  Diagnosis Date  . Anterior chest wall pain   . Cough   . UTI (urinary tract infection)   . HTN (hypertension)   . Hyperlipidemia   . History of diverticulitis of colon   . DM type 2 (diabetes mellitus, type 2)   . History of colon cancer   . Asthma   . Anxiety   . Iron deficiency anemia   . Allergic rhinitis   . IBS (irritable bowel syndrome)   . GERD (gastroesophageal reflux disease)   . Osteoporosis, unspecified     Past Surgical History  Procedure Date  . Esophagogastroduodenoscopy 12/26/01  . Cardiovascular stress test 02/25/04  . Right oophorectomy jan 2010    Family History  Problem Relation Age of Onset  . Atopy Neg Hx   . Asthma Neg Hx   .  Lung cancer Brother   . Melanoma Brother     History   Social History  . Marital Status: Widowed    Spouse Name: N/A    Number of Children: N/A  . Years of Education: N/A   Occupational History  . retired    Social History Main Topics  . Smoking status: Former Smoker -- 0.3 packs/day for 5 years    Types: Cigarettes    Quit date: 10/02/1986  . Smokeless tobacco: Never Used  . Alcohol Use: No  . Drug Use: No  . Sexually Active: Not on file   Other Topics Concern  . Not on file   Social History Narrative  . No narrative on file    Review of Systems:  All systems reviewed.  They are negative to the above problem except as previously stated.  Vital Signs: BP 177/77  Pulse 81  Ht 5\' 7"  (1.702 m)  Wt 208 lb (94.348 kg)  BMI 32.58 kg/m2  Physical Exam  Patient is in NAD  HEENT:  Normocephalic, atraumatic. EOMI, PERRLA.  Neck: JVP is normal. No thyromegaly. No bruits.  Lungs: clear to auscultation. No rales.  Mild wheezing.  Heart: Regular rate  and rhythm. Normal S1, S2. No S3.   No significant murmurs. PMI not displaced.  Abdomen:  Supple, nontender. Normal bowel sounds. No masses. No hepatomegaly.  Extremities:   Good distal pulses throughout. No lower extremity edema.  Musculoskeletal :moving all extremities.  Neuro:   alert and oriented x3.  CN II-XII grossly intact.  EKG:  Sinus rhythm.    Assessment and Plan:

## 2011-07-13 NOTE — Assessment & Plan Note (Addendum)
BP is high today.  Will need to be follwed and medicines adjusted as needed

## 2011-07-13 NOTE — Patient Instructions (Signed)
BMET Lab work. We will call you with results.

## 2011-07-17 ENCOUNTER — Encounter: Payer: Self-pay | Admitting: Endocrinology

## 2011-07-17 ENCOUNTER — Ambulatory Visit (INDEPENDENT_AMBULATORY_CARE_PROVIDER_SITE_OTHER): Payer: Medicare Other | Admitting: Endocrinology

## 2011-07-17 VITALS — BP 134/60 | HR 73 | Temp 98.1°F | Ht 67.5 in | Wt 208.0 lb

## 2011-07-17 DIAGNOSIS — I10 Essential (primary) hypertension: Secondary | ICD-10-CM

## 2011-07-17 DIAGNOSIS — Z0000 Encounter for general adult medical examination without abnormal findings: Secondary | ICD-10-CM

## 2011-07-17 DIAGNOSIS — Z23 Encounter for immunization: Secondary | ICD-10-CM

## 2011-07-17 MED ORDER — LOSARTAN POTASSIUM-HCTZ 100-12.5 MG PO TABS: 1.0000 | ORAL_TABLET | Freq: Every day | ORAL | Status: DC

## 2011-07-17 NOTE — Progress Notes (Signed)
Subjective:    Patient ID: [name], female    DOB: 12-16-1935, 75 y.o.   MRN: [id number]  HPI here for regular wellness examination.  He's feeling pretty well in general, and says chronic med probs are stable, except as noted below Past Medical History  Diagnosis Date  . Anterior chest wall pain   . Cough   . UTI (urinary tract infection)   . HTN (hypertension)   . Hyperlipidemia   . History of diverticulitis of colon   . DM type 2 (diabetes mellitus, type 2)   . History of colon cancer   . Asthma   . Anxiety   . Iron deficiency anemia   . Allergic rhinitis   . IBS (irritable bowel syndrome)   . GERD (gastroesophageal reflux disease)   . Osteoporosis, unspecified     Past Surgical History  Procedure Date  . Esophagogastroduodenoscopy 12/26/01  . Cardiovascular stress test 02/25/04  . Right oophorectomy jan 2010    History   Social History  . Marital Status: Widowed    Spouse Name: N/A    Number of Children: N/A  . Years of Education: N/A   Occupational History  . retired    Social History Main Topics  . Smoking status: Former Smoker -- 0.3 packs/day for 5 years    Types: Cigarettes    Quit date: 10/02/1986  . Smokeless tobacco: Never Used  . Alcohol Use: No  . Drug Use: No  . Sexually Active: Not on file   Other Topics Concern  . Not on file   Social History Narrative  . No narrative on file    Current Outpatient Prescriptions on File Prior to Visit  Medication Sig Dispense Refill  . albuterol (PROAIR HFA) 108 (90 BASE) MCG/ACT inhaler Inhale 2 puffs into the lungs every 6 (six) hours as needed for wheezing.  1 Inhaler  5  . amLODipine (NORVASC) 10 MG tablet TAKE 1/2 TABLET DAILY  30 tablet  2  . budesonide-formoterol (SYMBICORT) 80-4.5 MCG/ACT inhaler Inhale 2 puffs into the lungs 2 (two) times daily.  1 Inhaler  11  . dextromethorphan-guaiFENesin (MUCINEX DM) 30-600 MG per 12 hr tablet Take 1-2 tablets by mouth every 12 (twelve) hours as needed.        Marland Kitchen glucose blood (ONE TOUCH ULTRA TEST) test strip Use as instructed three times daily       . HYDROcodone-acetaminophen (NORCO) 5-325 MG per tablet 1/2 - 1 tab by mouth every 4 hours as needed for pain       . Insulin Syringe-Needle U-100 (BD INSULIN SYRINGE ULTRAFINE) 31G X 5/16" 0.3 ML MISC Use as directed       . montelukast (SINGULAIR) 10 MG tablet Take 1 tablet (10 mg total) by mouth at bedtime.  30 tablet  11  . NOVOLOG 100 UNIT/ML injection THREE TIMES A DAY (BEFORE EVERY MEAL) 25-20-40 UNITS.  10 mL  2  . ONE TOUCH LANCETS MISC Use as directed three times daily       . pravastatin (PRAVACHOL) 40 MG tablet TAKE 1 TABLET BY MOUTH AT BEDTIME  90 tablet  1    Allergies  Allergen Reactions  . Aspirin   . Ramipril     REACTION: Cough    Family History  Problem Relation Age of Onset  . Atopy Neg Hx   . Asthma Neg Hx   . Lung cancer Brother   . Melanoma Brother     BP 134/60  Pulse  73  Temp(Src) 98.1 F (36.7 C) (Oral)  Ht 5' 7.5" (1.715 m)  Wt 208 lb (94.348 kg)  BMI 32.10 kg/m2  SpO2 98%  Review of Systems  Constitutional: Negative for fever.       Pt reports weight gain  HENT: Negative for hearing loss.   Eyes: Negative for visual disturbance.  Respiratory: Negative for cough.   Cardiovascular:       No further chest pain  Gastrointestinal: Negative for anal bleeding.  Genitourinary: Negative for hematuria.  Musculoskeletal: Negative for joint swelling.  Skin: Negative for rash.  Neurological:       No change in chronic headache  Hematological: Bruises/bleeds easily.  Psychiatric/Behavioral: Negative for dysphoric mood.       Objective:   Physical Exam VS: see vs page GEN: no distress HEAD: head: no deformity eyes: no periorbital swelling, no proptosis external nose and ears are normal mouth: no lesion seen NECK: supple, thyroid is not enlarged CHEST WALL: no deformity BREASTS:  sees gyn CV: reg rate and rhythm, no murmur ABD: sees gyn GENITALIA:   sees gyn RECTAL: sees gyn MUSCULOSKELETAL: muscle bulk and strength are grossly normal.  no obvious joint swelling.  gait is normal and steady EXTEMITIES: no deformity.  no ulcer on the feet.  feet are of normal color and temp.  Trace bilat leg edema.  There is severe bilateral onychomycosis PULSES: dorsalis pedis intact bilat.  no carotid bruit NEURO:  cn 2-12 grossly intact.   readily moves all 4's.  sensation is intact to touch on the feet SKIN:  Normal texture and temperature.  No rash or suspicious lesion is visible.   NODES:  None palpable at the neck PSYCH: alert, oriented x3.  Does not appear anxious nor depressed.        Assessment & Plan:  Wellness visit today, with problems stable, except as noted.   SEPARATE EVALUATION FOLLOWS--EACH PROBLEM HERE IS NEW, NOT RESPONDING TO TREATMENT, OR POSES SIGNIFICANT RISK TO THE PATIENT'S HEALTH: HISTORY OF THE PRESENT ILLNESS: Pt says bp at home has been high.  Denies sob PAST MEDICAL HISTORY reviewed and up to date today REVIEW OF SYSTEMS: Denies loc PHYSICAL EXAMINATION: VITAL SIGNS:  See vs page GENERAL: no distress LUNGS:  Clear to auscultation LAB/XRAY RESULTS: i reviewed electrocardiogram IMPRESSION: Htn, needs increased rx PLAN: See instruction page

## 2011-07-17 NOTE — Patient Instructions (Addendum)
Refer to a gynecology specialist.  you will receive a phone call, about a day and time for an appointment. please consider these measures for your health:  minimize alcohol.  do not use tobacco products.  have a colonoscopy at least every 10 years from age 73.  Women should have an annual mammogram from age 33.  keep firearms safely stored.  always use seat belts.  have working smoke alarms in your home.  see an eye doctor and dentist regularly.  never drive under the influence of alcohol or drugs (including prescription drugs).   please let me know what your wishes would be, if artificial life support measures should become necessary.  it is critically important to prevent falling down (keep floor areas well-lit, dry, and free of loose objects.  If you have a cane, walker, or wheelchair, you should use it, even for short trips around the house.  Also, try not to rush). Please come back for a follow-up appointment in 6 months. Increase losartan-hct to 100/12.5, 1 daily.

## 2011-07-18 NOTE — Assessment & Plan Note (Signed)
Keep on statin.  Lipids are good.

## 2011-07-18 NOTE — Assessment & Plan Note (Signed)
Patient had one spell of diaphoresis that was unusual.  NO SOB or chest pain.  I am not convinced the spell represents an angina equivalent. She is otherwise active.  Denies symptoms.  She doe have vascular calcification noted (mild) consistent with atherosclerosis.  BUt I am not convinced the symptoms suggest a flow limiting lesion.  I would recomm continued aggressive risk factor modificaiotn with assumed CAD>  Unless she notices other symptoms (SOB, DOE, CP) i would not schedule further testing.

## 2011-07-19 ENCOUNTER — Telehealth: Payer: Self-pay | Admitting: Internal Medicine

## 2011-07-19 ENCOUNTER — Other Ambulatory Visit: Payer: Self-pay

## 2011-07-19 DIAGNOSIS — E876 Hypokalemia: Secondary | ICD-10-CM

## 2011-07-19 MED ORDER — POTASSIUM CHLORIDE ER 10 MEQ PO TBCR: 10.0000 meq | EXTENDED_RELEASE_TABLET | ORAL | Status: DC

## 2011-07-19 NOTE — Progress Notes (Signed)
Pt was notified.  Rx sent to [facility] on [address].  Pt unable to come in for labs until 08/02/11.

## 2011-07-19 NOTE — Telephone Encounter (Signed)
Pt rtn call from [place] from yesterday

## 2011-08-02 ENCOUNTER — Other Ambulatory Visit (INDEPENDENT_AMBULATORY_CARE_PROVIDER_SITE_OTHER): Payer: Medicare Other | Admitting: *Deleted

## 2011-08-02 DIAGNOSIS — E876 Hypokalemia: Secondary | ICD-10-CM

## 2011-08-02 LAB — BASIC METABOLIC PANEL
BUN: 22 mg/dL (ref 6–23)
CO2: 21 mEq/L (ref 19–32)
Calcium: 8.6 mg/dL (ref 8.4–10.5)
Chloride: 108 mEq/L (ref 96–112)
Creatinine, Ser: 1.4 mg/dL — ABNORMAL HIGH (ref 0.4–1.2)
GFR: 49.4 mL/min — ABNORMAL LOW (ref >60.00)
Glucose, Bld: 261 mg/dL — ABNORMAL HIGH (ref 70–99)
Potassium: 4 mEq/L (ref 3.5–5.1)
Sodium: 140 mEq/L (ref 135–145)

## 2011-08-04 ENCOUNTER — Other Ambulatory Visit: Payer: Self-pay | Admitting: *Deleted

## 2011-08-04 DIAGNOSIS — E876 Hypokalemia: Secondary | ICD-10-CM

## 2011-08-04 MED ORDER — POTASSIUM CHLORIDE ER 10 MEQ PO TBCR: 10.0000 meq | EXTENDED_RELEASE_TABLET | ORAL | Status: DC

## 2011-08-08 ENCOUNTER — Other Ambulatory Visit: Payer: Self-pay | Admitting: Internal Medicine

## 2011-08-10 ENCOUNTER — Other Ambulatory Visit: Payer: Self-pay | Admitting: Endocrinology

## 2011-09-07 ENCOUNTER — Other Ambulatory Visit: Payer: Self-pay | Admitting: Endocrinology

## 2011-10-05 ENCOUNTER — Other Ambulatory Visit: Payer: Self-pay | Admitting: Endocrinology

## 2011-10-05 DIAGNOSIS — Z1231 Encounter for screening mammogram for malignant neoplasm of breast: Secondary | ICD-10-CM

## 2011-10-09 ENCOUNTER — Other Ambulatory Visit: Payer: Self-pay | Admitting: Endocrinology

## 2011-10-31 ENCOUNTER — Ambulatory Visit
Admission: RE | Admit: 2011-10-31 | Discharge: 2011-10-31 | Disposition: A | Discharge status: Home / Self Care | Payer: Medicare Other | Source: Ambulatory Visit | Attending: Endocrinology | Admitting: Endocrinology

## 2011-10-31 DIAGNOSIS — Z1231 Encounter for screening mammogram for malignant neoplasm of breast: Secondary | ICD-10-CM

## 2012-01-09 ENCOUNTER — Other Ambulatory Visit: Payer: Self-pay | Admitting: Endocrinology

## 2012-02-22 ENCOUNTER — Telehealth: Payer: Self-pay | Admitting: Endocrinology

## 2012-02-22 NOTE — Telephone Encounter (Signed)
Ov is due.  We'll address then. 

## 2012-02-22 NOTE — Telephone Encounter (Signed)
Caller: Somer/Patient; PCP: Renato Shin; CB#: [phone].  Call regarding Samples of Insulin.  Caller asking to get samples of her Insulin as she has in the past. She can be reached at above #.

## 2012-02-22 NOTE — Telephone Encounter (Signed)
Pt informed of MD's advisement, transferred to scheduler to set up appointment.

## 2012-03-05 ENCOUNTER — Ambulatory Visit (INDEPENDENT_AMBULATORY_CARE_PROVIDER_SITE_OTHER): Payer: Medicare Other | Admitting: Endocrinology

## 2012-03-05 ENCOUNTER — Encounter: Payer: Self-pay | Admitting: Endocrinology

## 2012-03-05 VITALS — BP 122/62 | HR 73 | Temp 98.5°F | Ht 67.0 in | Wt 209.0 lb

## 2012-03-05 DIAGNOSIS — E1029 Type 1 diabetes mellitus with other diabetic kidney complication: Secondary | ICD-10-CM

## 2012-03-05 DIAGNOSIS — E119 Type 2 diabetes mellitus without complications: Secondary | ICD-10-CM

## 2012-03-05 DIAGNOSIS — Z79899 Other long term (current) drug therapy: Secondary | ICD-10-CM

## 2012-03-05 DIAGNOSIS — N058 Unspecified nephritic syndrome with other morphologic changes: Secondary | ICD-10-CM

## 2012-03-05 MED ORDER — TERBINAFINE HCL 250 MG PO TABS: 250.0000 mg | ORAL_TABLET | Freq: Every day | ORAL | Status: DC

## 2012-03-05 MED ORDER — HYDROCODONE-ACETAMINOPHEN 5-325 MG PO TABS: ORAL_TABLET | ORAL | Status: DC

## 2012-03-05 NOTE — Progress Notes (Signed)
Subjective:    Patient ID: [name], female    DOB: 15-Nov-1935, 76 y.o.   MRN: [id number]  HPI Pt returns for f/u of IDDM (dx'ed 99991111; complicated by renal insuff).  no cbg record, but states cbg's are highest in the afternon, and lowest in am.  It is well-controlled in general. She reports many years of severe thickening of the toenails, and slight assoc pain. Past Medical History  Diagnosis Date  . Anterior chest wall pain   . Cough   . UTI (urinary tract infection)   . HTN (hypertension)   . Hyperlipidemia   . History of diverticulitis of colon   . DM type 2 (diabetes mellitus, type 2)   . History of colon cancer   . Asthma   . Anxiety   . Iron deficiency anemia   . Allergic rhinitis   . IBS (irritable bowel syndrome)   . GERD (gastroesophageal reflux disease)   . Osteoporosis, unspecified     Past Surgical History  Procedure Date  . Esophagogastroduodenoscopy 12/26/01  . Cardiovascular stress test 02/25/04  . Right oophorectomy jan 2010    History   Social History  . Marital Status: Widowed    Spouse Name: N/A    Number of Children: N/A  . Years of Education: N/A   Occupational History  . retired    Social History Main Topics  . Smoking status: Former Smoker -- 0.3 packs/day for 5 years    Types: Cigarettes    Quit date: 10/02/1986  . Smokeless tobacco: Never Used  . Alcohol Use: No  . Drug Use: No  . Sexually Active: Not on file   Other Topics Concern  . Not on file   Social History Narrative  . No narrative on file    Current Outpatient Prescriptions on File Prior to Visit  Medication Sig Dispense Refill  . albuterol (PROAIR HFA) 108 (90 BASE) MCG/ACT inhaler Inhale 2 puffs into the lungs every 6 (six) hours as needed for wheezing.  1 Inhaler  5  . amLODipine (NORVASC) 10 MG tablet TAKE 1/2 TABLET DAILY  30 tablet  3  . budesonide-formoterol (SYMBICORT) 80-4.5 MCG/ACT inhaler Inhale 2 puffs into the lungs 2 (two) times daily.  1 Inhaler  11  .  dextromethorphan-guaiFENesin (MUCINEX DM) 30-600 MG per 12 hr tablet Take 1-2 tablets by mouth every 12 (twelve) hours as needed.       Marland Kitchen glucose blood (ONE TOUCH ULTRA TEST) test strip Use as instructed three times daily       . Insulin Syringe-Needle U-100 (BD INSULIN SYRINGE ULTRAFINE) 31G X 5/16" 0.3 ML MISC Use as directed       . losartan-hydrochlorothiazide (HYZAAR) 100-12.5 MG per tablet Take 1 tablet by mouth daily.  30 tablet  11  . montelukast (SINGULAIR) 10 MG tablet TAKE 1 TABLET (10 MG TOTAL) BY MOUTH AT BEDTIME.  30 tablet  11  . NOVOLOG 100 UNIT/ML injection THREE TIMES A DAY (BEFORE EVERY MEAL) 25-20-40 UNITS.  10 mL  5  . ONE TOUCH LANCETS MISC Use as directed three times daily       . potassium chloride (K-DUR) 10 MEQ tablet Take 1 tablet (10 mEq total) by mouth 1 day or 1 dose.  30 tablet  6  . pravastatin (PRAVACHOL) 40 MG tablet TAKE 1 TABLET BY MOUTH AT BEDTIME  90 tablet  3    Allergies  Allergen Reactions  . Aspirin   . Ramipril  REACTION: Cough    Family History  Problem Relation Age of Onset  . Atopy Neg Hx   . Asthma Neg Hx   . Lung cancer Brother   . Melanoma Brother     BP 122/62  Pulse 73  Temp(Src) 98.5 F (36.9 C) (Oral)  Ht 5\' 7"  (1.702 m)  Wt 209 lb (94.802 kg)  BMI 32.73 kg/m2  SpO2 98%   Review of Systems denies hypoglycemia    Objective:   Physical Exam VITAL SIGNS:  See vs page GENERAL: no distress Pulses: dorsalis pedis intact bilat.   Feet: no deformity.  no ulcer on the feet.  feet are of normal color and temp.  no edema.  There is severe bilateral onychomycosis Neuro: sensation is intact to touch on the feet.     Assessment & Plan:  HTN.  well-controlled Onychomycosis, causing pain DM.  Apparently well-controlled

## 2012-03-05 NOTE — Patient Instructions (Addendum)
Please come back for a regular physical appointment in 6 months. i have sent a prescription to your pharmacy, for the toenail fungus.   blood tests are being requested for you today.  You will receive a letter with results. check your blood sugar twice a day.  vary the time of day when you check, between before the 3 meals, and at bedtime.  also check if you have symptoms of your blood sugar being too high or too low.  please keep a record of the readings and bring it to your next appointment here.  please call us sooner if your blood sugar goes below 70, or if it stays over 200.

## 2012-04-13 ENCOUNTER — Other Ambulatory Visit: Payer: Self-pay | Admitting: Endocrinology

## 2012-04-21 ENCOUNTER — Other Ambulatory Visit: Payer: Self-pay | Admitting: Internal Medicine

## 2012-04-21 ENCOUNTER — Other Ambulatory Visit: Payer: Self-pay | Admitting: Endocrinology

## 2012-04-22 ENCOUNTER — Telehealth: Payer: Self-pay | Admitting: Internal Medicine

## 2012-04-22 MED ORDER — BUDESONIDE-FORMOTEROL FUMARATE 80-4.5 MCG/ACT IN AERO: 2.0000 | INHALATION_SPRAY | Freq: Two times a day (BID) | RESPIRATORY_TRACT | Status: DC

## 2012-04-22 NOTE — Telephone Encounter (Signed)
I spoke with pt and she is wanting sample of symbicort. We did not have any samples. Pt requested an rx be called in. I have done so. Nothing further needed

## 2012-05-10 ENCOUNTER — Telehealth: Payer: Self-pay | Admitting: Endocrinology

## 2012-05-10 NOTE — Telephone Encounter (Signed)
Caller: Cadynce/Patient; PCP: Renato Shin; CB#: [phone];   Calling To See If Office Has Any Samples of Novalog Insulin- Uses 25u in Am, 20u and Lunch, and 40-45U At Night;  Next refill not due until 05/18/12. SHE IS COMPLETELY OUT - NEEDS SMALL AMOUNT TO GET THROUGH until refill due. Medication Questions Protocol. PLEASE F/U WITH PATIENT TO LET HER KNOW. CB#: [phone];

## 2012-05-10 NOTE — Telephone Encounter (Signed)
Notified pt will leave sample of novolog for her to pick-up... 05/10/12@1 :23pm/LMB

## 2012-06-05 LAB — HM DIABETES EYE EXAM

## 2012-06-12 ENCOUNTER — Encounter: Payer: Self-pay | Admitting: Endocrinology

## 2012-07-05 ENCOUNTER — Ambulatory Visit (INDEPENDENT_AMBULATORY_CARE_PROVIDER_SITE_OTHER): Payer: Medicare Other | Admitting: Endocrinology

## 2012-07-05 DIAGNOSIS — Z23 Encounter for immunization: Secondary | ICD-10-CM

## 2012-07-19 ENCOUNTER — Other Ambulatory Visit: Payer: Self-pay | Admitting: Endocrinology

## 2012-09-03 ENCOUNTER — Other Ambulatory Visit: Payer: Self-pay | Admitting: Endocrinology

## 2012-09-16 ENCOUNTER — Telehealth: Payer: Self-pay | Admitting: *Deleted

## 2012-09-16 NOTE — Telephone Encounter (Signed)
PATIENT CALLED REQUEST SAMPLES OF HUMALOG OR NOVALOG  VIALS OR FLEX PENS. HER INSURANCE WILL ONLY COVER 30 DAYS. SHE TAKES 25U IN AM / 20U AT LUCH/ 11 UNTIS AFTERNOON. SHE RUNS OUT BEFORE TIME TO FILL AGAIN. LAST OV 03/2012. SHE WILL CALL BACK AND MAKE APPT. PLEASE ADVISE. CB [phone]

## 2012-09-16 NOTE — Telephone Encounter (Signed)
Patient notified of policy of have to have office visit with doctor for any samples. Patient scheduled to come tomorrow for appt. With Dr. Loanne Drilling to discuss insurance problem with insulin.

## 2012-09-16 NOTE — Telephone Encounter (Signed)
Ov is due.  Let's address then 

## 2012-09-17 ENCOUNTER — Ambulatory Visit (INDEPENDENT_AMBULATORY_CARE_PROVIDER_SITE_OTHER): Payer: Medicare Other | Admitting: Endocrinology

## 2012-09-17 ENCOUNTER — Encounter: Payer: Self-pay | Admitting: Endocrinology

## 2012-09-17 VITALS — BP 144/68 | HR 88 | Temp 98.6°F | Wt 206.0 lb

## 2012-09-17 DIAGNOSIS — Z0000 Encounter for general adult medical examination without abnormal findings: Secondary | ICD-10-CM

## 2012-09-17 DIAGNOSIS — I10 Essential (primary) hypertension: Secondary | ICD-10-CM

## 2012-09-17 DIAGNOSIS — Z79899 Other long term (current) drug therapy: Secondary | ICD-10-CM

## 2012-09-17 DIAGNOSIS — E785 Hyperlipidemia, unspecified: Secondary | ICD-10-CM

## 2012-09-17 DIAGNOSIS — D509 Iron deficiency anemia, unspecified: Secondary | ICD-10-CM

## 2012-09-17 DIAGNOSIS — E1029 Type 1 diabetes mellitus with other diabetic kidney complication: Secondary | ICD-10-CM

## 2012-09-17 LAB — BASIC METABOLIC PANEL
BUN: 20 mg/dL (ref 6–23)
CO2: 24 mEq/L (ref 19–32)
Chloride: 108 mEq/L (ref 96–112)
Creat: 1.3 mg/dL — ABNORMAL HIGH (ref 0.50–1.10)

## 2012-09-17 LAB — IBC PANEL: %SAT: 26 % (ref 20–55)

## 2012-09-17 LAB — HEPATIC FUNCTION PANEL
AST: 14 U/L (ref 0–37)
Alkaline Phosphatase: 93 U/L (ref 39–117)
Indirect Bilirubin: 0.6 mg/dL (ref 0.0–0.9)
Total Protein: 7 g/dL (ref 6.0–8.3)

## 2012-09-17 LAB — LIPID PANEL
Cholesterol: 120 mg/dL (ref 0–200)
Total CHOL/HDL Ratio: 2.6 Ratio
Triglycerides: 92 mg/dL (ref <150)
VLDL: 18 mg/dL (ref 0–40)

## 2012-09-17 LAB — CBC WITH DIFFERENTIAL/PLATELET
HCT: 32.8 % — ABNORMAL LOW (ref 36.0–46.0)
Hemoglobin: 11.4 g/dL — ABNORMAL LOW (ref 12.0–15.0)
Lymphocytes Relative: 27 % (ref 12–46)
MCHC: 34.8 g/dL (ref 30.0–36.0)
Monocytes Absolute: 0.5 10*3/uL (ref 0.1–1.0)
Monocytes Relative: 8 % (ref 3–12)
Neutro Abs: 3.6 10*3/uL (ref 1.7–7.7)
WBC: 6.1 10*3/uL (ref 4.0–10.5)

## 2012-09-17 LAB — TSH: TSH: 2.499 u[IU]/mL (ref 0.350–4.500)

## 2012-09-17 MED ORDER — INSULIN ASPART 100 UNIT/ML ~~LOC~~ SOLN: SUBCUTANEOUS | Status: DC

## 2012-09-17 NOTE — Progress Notes (Signed)
Subjective:    Patient ID: [name], female    DOB: [birth year], 76 y.o.   MRN: [id number]  HPI The state of at least three ongoing medical problems is addressed today, with interval history of each noted here: Pt returns for f/u of IDDM (dx'ed 99991111; complicated by renal insuff).  no cbg record, but states cbg's are highest in the afternon, and lowest in am.  It is well-controlled in general.  denies hypoglycemia.   HTN: she denies SOB Anemia: she denies brbpr Past Medical History  Diagnosis Date  . Anterior chest wall pain   . Cough   . UTI (urinary tract infection)   . HTN (hypertension)   . Hyperlipidemia   . History of diverticulitis of colon   . DM type 2 (diabetes mellitus, type 2)   . History of colon cancer   . Asthma   . Anxiety   . Iron deficiency anemia   . Allergic rhinitis   . IBS (irritable bowel syndrome)   . GERD (gastroesophageal reflux disease)   . Osteoporosis, unspecified     Past Surgical History  Procedure Date  . Esophagogastroduodenoscopy 12/26/01  . Cardiovascular stress test 02/25/04  . Right oophorectomy jan 2010    History   Social History  . Marital Status: Widowed    Spouse Name: N/A    Number of Children: N/A  . Years of Education: N/A   Occupational History  . retired    Social History Main Topics  . Smoking status: Former Smoker -- 0.3 packs/day for 5 years    Types: Cigarettes    Quit date: 10/02/1986  . Smokeless tobacco: Never Used  . Alcohol Use: No  . Drug Use: No  . Sexually Active: Not on file   Other Topics Concern  . Not on file   Social History Narrative  . No narrative on file    Current Outpatient Prescriptions on File Prior to Visit  Medication Sig Dispense Refill  . albuterol (PROAIR HFA) 108 (90 BASE) MCG/ACT inhaler Inhale 2 puffs into the lungs every 6 (six) hours as needed for wheezing.  1 Inhaler  5  . amLODipine (NORVASC) 10 MG tablet TAKE 1/2 TABLET DAILY  30 tablet  4  . budesonide-formoterol  (SYMBICORT) 80-4.5 MCG/ACT inhaler Inhale 2 puffs into the lungs 2 (two) times daily.  1 Inhaler  3  . dextromethorphan-guaiFENesin (MUCINEX DM) 30-600 MG per 12 hr tablet Take 1-2 tablets by mouth every 12 (twelve) hours as needed.       Marland Kitchen glucose blood (ONE TOUCH ULTRA TEST) test strip Use as instructed three times daily       . HYDROcodone-acetaminophen (NORCO) 5-325 MG per tablet 1/2 - 1 tab by mouth every 4 hours as needed for pain  50 tablet  2  . Insulin Syringe-Needle U-100 (BD INSULIN SYRINGE ULTRAFINE) 31G X 5/16" 0.3 ML MISC Use as directed       . losartan-hydrochlorothiazide (HYZAAR) 100-12.5 MG per tablet TAKE 1 TABLET BY MOUTH DAILY.  30 tablet  1  . montelukast (SINGULAIR) 10 MG tablet TAKE 1 TABLET (10 MG TOTAL) BY MOUTH AT BEDTIME.  30 tablet  11  . ONE TOUCH LANCETS MISC Use as directed three times daily       . potassium chloride (K-DUR) 10 MEQ tablet Take 1 tablet (10 mEq total) by mouth 1 day or 1 dose.  30 tablet  6  . pravastatin (PRAVACHOL) 40 MG tablet TAKE 1 TABLET BY MOUTH  AT BEDTIME  90 tablet  3  . terbinafine (LAMISIL) 250 MG tablet Take 1 tablet (250 mg total) by mouth daily.  90 tablet  0    Allergies  Allergen Reactions  . Aspirin   . Ramipril     REACTION: Cough    Family History  Problem Relation Age of Onset  . Atopy Neg Hx   . Asthma Neg Hx   . Lung cancer Brother   . Melanoma Brother     BP 144/68  Pulse 88  Temp 98.6 F (37 C) (Oral)  Wt 206 lb (93.441 kg)  SpO2 98%  Review of Systems Denies hematuria and weight change    Objective:   Physical Exam Pulses: dorsalis pedis intact bilat.   Feet: no deformity.  no ulcer on the feet.  feet are of normal color and temp.  no edema Neuro: sensation is intact to touch on the feet.    Lab Results  Component Value Date   WBC 6.1 09/17/2012   HGB 11.4* 09/17/2012   HCT 32.8* 09/17/2012   PLT 165 09/17/2012   GLUCOSE 209* 09/17/2012   CHOL 120 09/17/2012   TRIG 92 09/17/2012   HDL 47  09/17/2012   LDLCALC 55 09/17/2012   ALT 12 09/17/2012   AST 14 09/17/2012   NA 142 09/17/2012   K 4.3 09/17/2012   CL 108 09/17/2012   CREATININE 1.30* 09/17/2012   BUN 20 09/17/2012   CO2 24 09/17/2012   TSH 2.499 09/17/2012   HGBA1C 6.5* 09/17/2012   MICROALBUR 1.76 09/17/2012      Assessment & Plan:  Anemia, mild, unchanged, uncertain etiology DM, well-controlled HTN, with ? Of situational component

## 2012-09-17 NOTE — Patient Instructions (Addendum)
blood tests are being requested for you today.  We'll contact you with results.  check your blood sugar twice a day.  vary the time of day when you check, between before the 3 meals, and at bedtime.  also check if you have symptoms of your blood sugar being too high or too low.  please keep a record of the readings and bring it to your next appointment here.  please call us sooner if your blood sugar goes below 70, or if you have a lot of readings over 200.  Please come back in January for a regular physical.

## 2012-09-18 LAB — URINALYSIS, ROUTINE W REFLEX MICROSCOPIC
Bilirubin Urine: NEGATIVE
Glucose, UA: NEGATIVE mg/dL
Specific Gravity, Urine: 1.007 (ref 1.005–1.030)

## 2012-09-18 LAB — MICROALBUMIN / CREATININE URINE RATIO: Microalb Creat Ratio: 14.2 mg/g (ref 0.0–30.0)

## 2012-09-30 ENCOUNTER — Telehealth: Payer: Self-pay | Admitting: Endocrinology

## 2012-09-30 MED ORDER — LOSARTAN POTASSIUM-HCTZ 100-12.5 MG PO TABS: 30.0000 | ORAL_TABLET | Freq: Every day | ORAL | Status: DC

## 2012-09-30 NOTE — Telephone Encounter (Signed)
Pt called to check on th status of the refill that was req from the pharmacy (Losartan 100-12.5mg ), Please call pt back, pt have been out of this med since 09/25/12.

## 2012-10-03 ENCOUNTER — Other Ambulatory Visit: Payer: Self-pay | Admitting: Internal Medicine

## 2012-10-14 ENCOUNTER — Encounter: Payer: Self-pay | Admitting: Endocrinology

## 2012-10-14 ENCOUNTER — Ambulatory Visit (INDEPENDENT_AMBULATORY_CARE_PROVIDER_SITE_OTHER): Payer: Medicare Other | Admitting: Endocrinology

## 2012-10-14 VITALS — BP 138/80 | HR 76 | Wt 209.0 lb

## 2012-10-14 DIAGNOSIS — Z0000 Encounter for general adult medical examination without abnormal findings: Secondary | ICD-10-CM

## 2012-10-14 NOTE — Patient Instructions (Addendum)
please consider these measures for your health:  minimize alcohol.  do not use tobacco products.  have a colonoscopy at least every 10 years from age 75.  Women should have an annual mammogram from age 37.  keep firearms safely stored.  always use seat belts.  have working smoke alarms in your home.  see an eye doctor and dentist regularly.  never drive under the influence of alcohol or drugs (including prescription drugs).   please let me know what your wishes would be, if artificial life support measures should become necessary.  it is critically important to prevent falling down (keep floor areas well-lit, dry, and free of loose objects.  If you have a cane, walker, or wheelchair, you should use it, even for short trips around the house.  Also, try not to rush). Please come back for a follow-up appointment in 6 months.

## 2012-10-14 NOTE — Progress Notes (Signed)
Subjective:    Patient ID: [name], female    DOB: 29-Sep-1936, 77 y.o.   MRN: [id number]  HPI here for regular wellness examination.  He's feeling pretty well in general, and says chronic med probs are stable, except as noted below Past Medical History  Diagnosis Date  . Anterior chest wall pain   . Cough   . UTI (urinary tract infection)   . HTN (hypertension)   . Hyperlipidemia   . History of diverticulitis of colon   . DM type 2 (diabetes mellitus, type 2)   . History of colon cancer   . Asthma   . Anxiety   . Iron deficiency anemia   . Allergic rhinitis   . IBS (irritable bowel syndrome)   . GERD (gastroesophageal reflux disease)   . Osteoporosis, unspecified     Past Surgical History  Procedure Date  . Esophagogastroduodenoscopy 12/26/01  . Cardiovascular stress test 02/25/04  . Right oophorectomy jan 2010    History   Social History  . Marital Status: Widowed    Spouse Name: N/A    Number of Children: N/A  . Years of Education: N/A   Occupational History  . retired    Social History Main Topics  . Smoking status: Former Smoker -- 0.3 packs/day for 5 years    Types: Cigarettes    Quit date: 10/02/1986  . Smokeless tobacco: Never Used  . Alcohol Use: No  . Drug Use: No  . Sexually Active: Not on file   Other Topics Concern  . Not on file   Social History Narrative  . No narrative on file    Current Outpatient Prescriptions on File Prior to Visit  Medication Sig Dispense Refill  . albuterol (PROAIR HFA) 108 (90 BASE) MCG/ACT inhaler Inhale 2 puffs into the lungs every 6 (six) hours as needed for wheezing.  1 Inhaler  5  . amLODipine (NORVASC) 10 MG tablet TAKE 1/2 TABLET DAILY  30 tablet  4  . budesonide-formoterol (SYMBICORT) 80-4.5 MCG/ACT inhaler Inhale 2 puffs into the lungs 2 (two) times daily.  1 Inhaler  3  . dextromethorphan-guaiFENesin (MUCINEX DM) 30-600 MG per 12 hr tablet Take 1-2 tablets by mouth every 12 (twelve) hours as needed.         Marland Kitchen glucose blood (ONE TOUCH ULTRA TEST) test strip Use as instructed three times daily       . HYDROcodone-acetaminophen (NORCO) 5-325 MG per tablet 1/2 - 1 tab by mouth every 4 hours as needed for pain  50 tablet  2  . insulin aspart (NOVOLOG) 100 UNIT/ML injection THREE TIMES A DAY (BEFORE EVERY MEAL) 25-20-40 UNITS.  3 vial  12  . Insulin Syringe-Needle U-100 (BD INSULIN SYRINGE ULTRAFINE) 31G X 5/16" 0.3 ML MISC Use as directed       . losartan-hydrochlorothiazide (HYZAAR) 100-12.5 MG per tablet Take 30 tablets by mouth daily.  30 tablet  1  . montelukast (SINGULAIR) 10 MG tablet TAKE 1 TABLET (10 MG TOTAL) BY MOUTH AT BEDTIME.  30 tablet  11  . ONE TOUCH LANCETS MISC Use as directed three times daily       . potassium chloride (K-DUR) 10 MEQ tablet Take 1 tablet (10 mEq total) by mouth 1 day or 1 dose.  30 tablet  6  . pravastatin (PRAVACHOL) 40 MG tablet TAKE 1 TABLET BY MOUTH AT BEDTIME  90 tablet  3  . terbinafine (LAMISIL) 250 MG tablet Take 1 tablet (250 mg total)  by mouth daily.  90 tablet  0    Allergies  Allergen Reactions  . Aspirin   . Ramipril     REACTION: Cough    Family History  Problem Relation Age of Onset  . Atopy Neg Hx   . Asthma Neg Hx   . Lung cancer Brother   . Melanoma Brother     BP 138/80  Pulse 76  Wt 209 lb (94.802 kg)  SpO2 97%     Review of Systems  Constitutional: Negative for fever and unexpected weight change.  HENT: Negative for hearing loss.   Eyes: Negative for visual disturbance.  Respiratory: Positive for wheezing.   Cardiovascular: Negative for chest pain.  Gastrointestinal: Negative for anal bleeding.  Genitourinary: Negative for hematuria and vaginal bleeding.  Musculoskeletal: Negative for back pain.  Skin: Negative for rash.  Neurological: Negative for syncope and numbness.  Hematological: Bruises/bleeds easily.  Psychiatric/Behavioral: Negative for dysphoric mood.      Objective:   Physical Exam VS: see vs page GEN:  no distress HEAD: head: no deformity eyes: no periorbital swelling, no proptosis external nose and ears are normal mouth: no lesion seen NECK: supple, thyroid is not enlarged CHEST WALL: no deformity LUNGS:  Clear to auscultation BREASTS:  No mass.  No d/c CV: reg rate and rhythm, no murmur ABD: abdomen is soft, nontender.  no hepatosplenomegaly.  not distended.  no hernia.  Old healed surgical scar GENITALIA:  Normal external female.  Normal bimanual exam RECTAL: normal external and internal exam.  heme neg MUSCULOSKELETAL: muscle bulk and strength are grossly normal.  no obvious joint swelling.  gait is normal and steady EXTEMITIES: no deformity.  no ulcer on the feet.  feet are of normal color and temp.  no edema. There is bilateral onychomycosis PULSES: dorsalis pedis intact bilat.  no carotid bruit NEURO:  cn 2-12 grossly intact.   readily moves all 4's.  sensation is intact to touch on the feet SKIN:  Normal texture and temperature.  No rash or suspicious lesion is visible.   NODES:  None palpable at the neck PSYCH: alert, oriented x3.  Does not appear anxious nor depressed.   Lab Results  Component Value Date   WBC 6.1 09/17/2012   HGB 11.4* 09/17/2012   HCT 32.8* 09/17/2012   PLT 165 09/17/2012   GLUCOSE 209* 09/17/2012   CHOL 120 09/17/2012   TRIG 92 09/17/2012   HDL 47 09/17/2012   LDLCALC 55 09/17/2012   ALT 12 09/17/2012   AST 14 09/17/2012   NA 142 09/17/2012   K 4.3 09/17/2012   CL 108 09/17/2012   CREATININE 1.30* 09/17/2012   BUN 20 09/17/2012   CO2 24 09/17/2012   TSH 2.499 09/17/2012   HGBA1C 6.5* 09/17/2012   MICROALBUR 1.76 09/17/2012      Assessment & Plan:  Wellness visit today, with problems stable, except as noted.

## 2012-10-29 ENCOUNTER — Encounter: Payer: Self-pay | Admitting: Endocrinology

## 2012-11-22 ENCOUNTER — Other Ambulatory Visit: Payer: Self-pay

## 2012-11-22 MED ORDER — LOSARTAN POTASSIUM-HCTZ 100-12.5 MG PO TABS: 30.0000 | ORAL_TABLET | Freq: Every day | ORAL | Status: DC

## 2012-12-24 ENCOUNTER — Telehealth: Payer: Self-pay | Admitting: Internal Medicine

## 2012-12-24 MED ORDER — ALBUTEROL SULFATE HFA 108 (90 BASE) MCG/ACT IN AERS: 2.0000 | INHALATION_SPRAY | Freq: Four times a day (QID) | RESPIRATORY_TRACT | Status: DC | PRN

## 2012-12-24 MED ORDER — BUDESONIDE-FORMOTEROL FUMARATE 80-4.5 MCG/ACT IN AERO: 2.0000 | INHALATION_SPRAY | Freq: Two times a day (BID) | RESPIRATORY_TRACT | Status: DC

## 2012-12-24 NOTE — Telephone Encounter (Signed)
Rx's have been sent in. Pt is aware. 

## 2012-12-30 ENCOUNTER — Other Ambulatory Visit: Payer: Self-pay

## 2012-12-30 MED ORDER — MONTELUKAST SODIUM 10 MG PO TABS: ORAL_TABLET | ORAL | Status: DC

## 2012-12-30 MED ORDER — AMLODIPINE BESYLATE 10 MG PO TABS: ORAL_TABLET | ORAL | Status: DC

## 2012-12-31 ENCOUNTER — Encounter (HOSPITAL_COMMUNITY): Payer: Self-pay | Admitting: Emergency Medicine

## 2012-12-31 ENCOUNTER — Emergency Department (HOSPITAL_COMMUNITY): Payer: Medicare Other

## 2012-12-31 ENCOUNTER — Emergency Department (HOSPITAL_COMMUNITY)
Admission: EM | Admit: 2012-12-31 | Discharge: 2012-12-31 | Disposition: A | Discharge status: Home / Self Care | Payer: Medicare Other | Attending: Emergency Medicine | Admitting: Emergency Medicine

## 2012-12-31 DIAGNOSIS — R1032 Left lower quadrant pain: Secondary | ICD-10-CM

## 2012-12-31 DIAGNOSIS — Z794 Long term (current) use of insulin: Secondary | ICD-10-CM | POA: Insufficient documentation

## 2012-12-31 DIAGNOSIS — Z8719 Personal history of other diseases of the digestive system: Secondary | ICD-10-CM | POA: Insufficient documentation

## 2012-12-31 DIAGNOSIS — M810 Age-related osteoporosis without current pathological fracture: Secondary | ICD-10-CM | POA: Insufficient documentation

## 2012-12-31 DIAGNOSIS — Z87440 Personal history of urinary (tract) infections: Secondary | ICD-10-CM | POA: Insufficient documentation

## 2012-12-31 DIAGNOSIS — K8000 Calculus of gallbladder with acute cholecystitis without obstruction: Secondary | ICD-10-CM | POA: Insufficient documentation

## 2012-12-31 DIAGNOSIS — R197 Diarrhea, unspecified: Secondary | ICD-10-CM | POA: Insufficient documentation

## 2012-12-31 DIAGNOSIS — Z79899 Other long term (current) drug therapy: Secondary | ICD-10-CM | POA: Insufficient documentation

## 2012-12-31 DIAGNOSIS — Z87891 Personal history of nicotine dependence: Secondary | ICD-10-CM | POA: Insufficient documentation

## 2012-12-31 DIAGNOSIS — Z8679 Personal history of other diseases of the circulatory system: Secondary | ICD-10-CM | POA: Insufficient documentation

## 2012-12-31 DIAGNOSIS — IMO0002 Reserved for concepts with insufficient information to code with codable children: Secondary | ICD-10-CM | POA: Insufficient documentation

## 2012-12-31 DIAGNOSIS — E785 Hyperlipidemia, unspecified: Secondary | ICD-10-CM | POA: Insufficient documentation

## 2012-12-31 DIAGNOSIS — Z862 Personal history of diseases of the blood and blood-forming organs and certain disorders involving the immune mechanism: Secondary | ICD-10-CM | POA: Insufficient documentation

## 2012-12-31 DIAGNOSIS — I10 Essential (primary) hypertension: Secondary | ICD-10-CM | POA: Insufficient documentation

## 2012-12-31 DIAGNOSIS — R112 Nausea with vomiting, unspecified: Secondary | ICD-10-CM

## 2012-12-31 DIAGNOSIS — Z85038 Personal history of other malignant neoplasm of large intestine: Secondary | ICD-10-CM | POA: Insufficient documentation

## 2012-12-31 DIAGNOSIS — F411 Generalized anxiety disorder: Secondary | ICD-10-CM | POA: Insufficient documentation

## 2012-12-31 DIAGNOSIS — K219 Gastro-esophageal reflux disease without esophagitis: Secondary | ICD-10-CM | POA: Insufficient documentation

## 2012-12-31 DIAGNOSIS — E119 Type 2 diabetes mellitus without complications: Secondary | ICD-10-CM | POA: Insufficient documentation

## 2012-12-31 DIAGNOSIS — K8020 Calculus of gallbladder without cholecystitis without obstruction: Secondary | ICD-10-CM

## 2012-12-31 LAB — POCT I-STAT, CHEM 8
BUN: 12 mg/dL (ref 6–23)
Calcium, Ion: 1.13 mmol/L (ref 1.13–1.30)
Chloride: 109 mEq/L (ref 96–112)
Creatinine, Ser: 1.1 mg/dL (ref 0.50–1.10)
Glucose, Bld: 262 mg/dL — ABNORMAL HIGH (ref 70–99)
HCT: 36 % (ref 36.0–46.0)
Hemoglobin: 12.2 g/dL (ref 12.0–15.0)
Potassium: 3.7 mEq/L (ref 3.5–5.1)
Sodium: 144 mEq/L (ref 135–145)
TCO2: 21 mmol/L (ref 0–100)

## 2012-12-31 LAB — CBC WITH DIFFERENTIAL/PLATELET
Basophils Absolute: 0 K/uL (ref 0.0–0.1)
Basophils Relative: 0 % (ref 0–1)
Eosinophils Absolute: 0.1 K/uL (ref 0.0–0.7)
Eosinophils Relative: 1 % (ref 0–5)
HCT: 31.8 % — ABNORMAL LOW (ref 36.0–46.0)
Hemoglobin: 11.6 g/dL — ABNORMAL LOW (ref 12.0–15.0)
Lymphocytes Relative: 7 % — ABNORMAL LOW (ref 12–46)
Lymphs Abs: 0.8 10*3/uL (ref 0.7–4.0)
MCH: 26.4 pg (ref 26.0–34.0)
MCHC: 36.5 g/dL — ABNORMAL HIGH (ref 30.0–36.0)
MCV: 72.4 fL — ABNORMAL LOW (ref 78.0–100.0)
Monocytes Absolute: 0.4 10*3/uL (ref 0.1–1.0)
Monocytes Relative: 4 % (ref 3–12)
Neutro Abs: 9.7 10*3/uL — ABNORMAL HIGH (ref 1.7–7.7)
Neutrophils Relative %: 88 % — ABNORMAL HIGH (ref 43–77)
Platelets: 154 K/uL (ref 150–400)
RBC: 4.39 MIL/uL (ref 3.87–5.11)
RDW: 18.4 % — ABNORMAL HIGH (ref 11.5–15.5)
WBC: 11.1 K/uL — ABNORMAL HIGH (ref 4.0–10.5)

## 2012-12-31 LAB — URINALYSIS, ROUTINE W REFLEX MICROSCOPIC
Bilirubin Urine: NEGATIVE
Glucose, UA: NEGATIVE mg/dL
Ketones, ur: NEGATIVE mg/dL
Nitrite: NEGATIVE
Protein, ur: NEGATIVE mg/dL
Specific Gravity, Urine: 1.005 (ref 1.005–1.030)
Urobilinogen, UA: 0.2 mg/dL (ref 0.0–1.0)
pH: 6.5 (ref 5.0–8.0)

## 2012-12-31 LAB — URINE MICROSCOPIC-ADD ON

## 2012-12-31 LAB — LIPASE, BLOOD: Lipase: 40 U/L (ref 11–59)

## 2012-12-31 MED ORDER — SODIUM CHLORIDE 0.9 % IV SOLN
Freq: Once | INTRAVENOUS | Status: AC
  Administered 2012-12-31: 10:00:00 via INTRAVENOUS

## 2012-12-31 MED ORDER — TRAMADOL HCL 50 MG PO TABS: 50.0000 mg | ORAL_TABLET | Freq: Four times a day (QID) | ORAL | Status: DC | PRN

## 2012-12-31 MED ORDER — IOHEXOL 300 MG/ML  SOLN
25.0000 mL | INTRAMUSCULAR | Status: AC
  Administered 2012-12-31 (×2): 25 mL via ORAL

## 2012-12-31 MED ORDER — HYDROMORPHONE HCL PF 1 MG/ML IJ SOLN
0.5000 mg | INTRAMUSCULAR | Status: DC | PRN
  Administered 2012-12-31: 0.5 mg via INTRAVENOUS
  Filled 2012-12-31: qty 1

## 2012-12-31 MED ORDER — ONDANSETRON HCL 4 MG/2ML IJ SOLN
4.0000 mg | Freq: Once | INTRAMUSCULAR | Status: AC
  Administered 2012-12-31: 4 mg via INTRAVENOUS
  Filled 2012-12-31: qty 2

## 2012-12-31 MED ORDER — ONDANSETRON 8 MG PO TBDP: 8.0000 mg | ORAL_TABLET | Freq: Two times a day (BID) | ORAL | Status: DC | PRN

## 2012-12-31 MED ORDER — IOHEXOL 300 MG/ML  SOLN
100.0000 mL | Freq: Once | INTRAMUSCULAR | Status: AC | PRN
  Administered 2012-12-31: 100 mL via INTRAVENOUS

## 2012-12-31 MED ORDER — PROMETHAZINE HCL 25 MG PO TABS: 25.0000 mg | ORAL_TABLET | Freq: Four times a day (QID) | ORAL | Status: DC | PRN

## 2012-12-31 NOTE — ED Notes (Signed)
Patient ambulated to restroom with family member assist.

## 2012-12-31 NOTE — ED Provider Notes (Signed)
History     CSN: [id number]  Arrival date & time 12/31/12  0705   First MD Initiated Contact with Patient 12/31/12 0732      Chief Complaint  Patient presents with  . Abdominal Pain  . Emesis  . Diarrhea    (Consider location/radiation/quality/duration/timing/severity/associated sxs/prior treatment) HPI Comments: Level V caveat due to to patient condition. Patient with prior history of diverticulitis comes in with onset of middle and left lower quadrant discomfort that was rather sudden in onset now associated with emesis and diarrhea. Symptoms are somewhat similar to prior episode of diverticulitis. She denies any new recent medications or sick contacts. She has a significant history of diabetes and hypertension. She reports no radiation to her flank or back and no changes in urination.  Patient is a 77 y.o. female presenting with abdominal pain, vomiting, and diarrhea. The history is provided by the patient and a relative.  Abdominal Pain Associated symptoms: diarrhea and vomiting   Emesis Associated symptoms: abdominal pain and diarrhea   Diarrhea Associated symptoms: abdominal pain and vomiting     Past Medical History  Diagnosis Date  . Anterior chest wall pain   . Cough   . UTI (urinary tract infection)   . HTN (hypertension)   . Hyperlipidemia   . History of diverticulitis of colon   . DM type 2 (diabetes mellitus, type 2)   . History of colon cancer   . Asthma   . Anxiety   . Iron deficiency anemia   . Allergic rhinitis   . IBS (irritable bowel syndrome)   . GERD (gastroesophageal reflux disease)   . Osteoporosis, unspecified     Past Surgical History  Procedure Laterality Date  . Esophagogastroduodenoscopy  12/26/01  . Cardiovascular stress test  02/25/04  . Right oophorectomy  jan 2010    Family History  Problem Relation Age of Onset  . Atopy Neg Hx   . Asthma Neg Hx   . Lung cancer Brother   . Melanoma Brother     History  Substance Use Topics   . Smoking status: Former Smoker -- 0.30 packs/day for 5 years    Types: Cigarettes    Quit date: 10/02/1986  . Smokeless tobacco: Never Used  . Alcohol Use: No    OB History   Grav Para Term Preterm Abortions TAB SAB Ect Mult Living                  Review of Systems  Unable to perform ROS: Acuity of condition  Gastrointestinal: Positive for vomiting, abdominal pain and diarrhea.    Allergies  Aspirin and Ramipril  Home Medications   Current Outpatient Rx  Name  Route  Sig  Dispense  Refill  . acetaminophen (TYLENOL) 325 MG tablet   Oral   Take 650 mg by mouth every 6 (six) hours as needed for pain.         Marland Kitchen albuterol (PROAIR HFA) 108 (90 BASE) MCG/ACT inhaler   Inhalation   Inhale 2 puffs into the lungs every 6 (six) hours as needed for wheezing.   1 Inhaler   0   . amLODipine (NORVASC) 10 MG tablet   Oral   Take 5 mg by mouth daily.         . budesonide-formoterol (SYMBICORT) 80-4.5 MCG/ACT inhaler   Inhalation   Inhale 2 puffs into the lungs 2 (two) times daily.   1 Inhaler   0   . insulin aspart (NOVOLOG) 100 UNIT/ML  injection   Subcutaneous   Inject 20-40 Units into the skin 3 (three) times daily before meals. Use 25 units at breakfast, use 20 units at lunch and use 40 units at dinner         . losartan-hydrochlorothiazide (HYZAAR) 100-12.5 MG per tablet   Oral   Take 30 tablets by mouth daily.   30 tablet   1     Pt needs CPE in Dec for more refills, Please call  ...   . montelukast (SINGULAIR) 10 MG tablet   Oral   Take 10 mg by mouth at bedtime.         . pravastatin (PRAVACHOL) 40 MG tablet   Oral   Take 40 mg by mouth daily.         Marland Kitchen glucose blood (ONE TOUCH ULTRA TEST) test strip      Use as instructed three times daily          . Insulin Syringe-Needle U-100 (BD INSULIN SYRINGE ULTRAFINE) 31G X 5/16" 0.3 ML MISC      Use as directed          . ondansetron (ZOFRAN-ODT) 8 MG disintegrating tablet   Oral   Take 1  tablet (8 mg total) by mouth every 12 (twelve) hours as needed for nausea.   20 tablet   0   . ONE TOUCH LANCETS MISC      Use as directed three times daily          . promethazine (PHENERGAN) 25 MG tablet   Oral   Take 1 tablet (25 mg total) by mouth every 6 (six) hours as needed for nausea.   20 tablet   0   . traMADol (ULTRAM) 50 MG tablet   Oral   Take 1 tablet (50 mg total) by mouth every 6 (six) hours as needed for pain.   15 tablet   0     BP 148/66  Pulse 83  Temp(Src) 99.7 F (37.6 C) (Oral)  Resp 18  Ht 5\' 7"  (1.702 m)  Wt 206 lb (93.441 kg)  BMI 32.26 kg/m2  SpO2 98%  Physical Exam  Nursing note and vitals reviewed. Constitutional: She appears well-developed and well-nourished. She appears distressed.  Patient is mildly ill appearing, is actively vomiting here in the emergency department room. She does not appear septic or toxic however.  HENT:  Head: Normocephalic and atraumatic.  Eyes: EOM are normal. No scleral icterus.  Neck: Normal range of motion. Neck supple.  Cardiovascular: Normal rate and regular rhythm.   Pulmonary/Chest: Effort normal. No respiratory distress. She has no wheezes. She has no rales.  Abdominal: Soft. She exhibits no distension. There is tenderness in the suprapubic area and left lower quadrant. There is no rebound and no CVA tenderness.  Neurological: She is alert.  Skin: Skin is warm.  Psychiatric: She has a normal mood and affect.    ED Course  Procedures (including critical care time)  Labs Reviewed  CBC WITH DIFFERENTIAL - Abnormal; Notable for the following:    WBC 11.1 (*)    Hemoglobin 11.6 (*)    HCT 31.8 (*)    MCV 72.4 (*)    MCHC 36.5 (*)    RDW 18.4 (*)    Neutrophils Relative 88 (*)    Neutro Abs 9.7 (*)    Lymphocytes Relative 7 (*)    All other components within normal limits  URINALYSIS, ROUTINE W REFLEX MICROSCOPIC - Abnormal; Notable for the  following:    Hgb urine dipstick TRACE (*)     Leukocytes, UA TRACE (*)    All other components within normal limits  POCT I-STAT, CHEM 8 - Abnormal; Notable for the following:    Glucose, Bld 262 (*)    All other components within normal limits  LIPASE, BLOOD  URINE MICROSCOPIC-ADD ON   Ct Abdomen Pelvis W Contrast  12/31/2012  *RADIOLOGY REPORT*  Clinical Data: Abdominal pain.  CT ABDOMEN AND PELVIS WITH CONTRAST  Technique:  Multidetector CT imaging of the abdomen and pelvis was performed following the standard protocol during bolus administration of intravenous contrast.  Contrast: 145mL OMNIPAQUE IOHEXOL 300 MG/ML  SOLN  Comparison: July 15, 2008.  Findings: Visualized lung bases appear normal. There is continued thickening of the distal esophagus which is unchanged compared to prior exam.  Two low density lesions are noted in the right hepatic lobe with the largest measuring 14 mm posteriorly.  These are unchanged compared to prior exam consistent with benign hepatic cysts or hemangiomas.  No other focal abnormalities seen involving the liver, spleen or pancreas.  Small gallstones are noted. Adrenal glands and kidneys appear normal.  No hydronephrosis or renal obstruction is noted.  The appendix is visualized and appears normal.  No evidence of bowel obstruction is noted.  Urinary bladder appears normal.  No abnormal fluid collection is noted.  No adenopathy is noted in the abdomen or pelvis.  No mass is noted in the pelvis.  Large cystic lesion noted on prior exam appears to have resolved.  IMPRESSION: Stable hepatic cysts or hemangiomas seen in right hepatic lobe. Continued thickening of distal esophagus which is unchanged compared to prior exam and is concerning for some form of chronic inflammation; endoscopy may be performed for further evaluation. Mild cholelithiasis.  No other significant abnormality seen in the abdomen or pelvis.   Original Report Authenticated By: Marijo Conception.,  M.D.      1. Nausea vomiting and diarrhea   2.  Abdominal pain, LLQ (left lower quadrant)   3. Cholelithiasis without obstruction     Room air saturation is 100% and I interpret this to be normal.   1:43 PM Results of CT discussed with pt and family.  Abd is soft, pt feels improved.  No further vomiting.  Pt is comfortable going home provided she passes po challenge.    MDM   Patient with suprapubic and left lower quadrant discomfort and thus diverticulitis which she has had in the past is on the differential. We'll also check a urine to assess for hematuria or signs of urinary tract infection. Due to my suspicion, will go ahead and order a abdominal and pelvic CT scan with contrast. Otherwise IV fluids, IV analgesics and anti-emetics will be ordered as well. No signs of jaundice clinically.        Saddie Benders. Kristofer Schaffert, MD 12/31/12 1513

## 2012-12-31 NOTE — Discharge Instructions (Signed)
 Abdominal Pain Abdominal pain can be caused by many things. Your caregiver decides the seriousness of your pain by an examination and possibly blood tests and X-rays. Many cases can be observed and treated at home. Most abdominal pain is not caused by a disease and will probably improve without treatment. However, in many cases, more time must pass before a clear cause of the pain can be found. Before that point, it may not be known if you need more testing, or if hospitalization or surgery is needed. HOME CARE INSTRUCTIONS   Do not take laxatives unless directed by your caregiver.  Take pain medicine only as directed by your caregiver.  Only take over-the-counter or prescription medicines for pain, discomfort, or fever as directed by your caregiver.  Try a clear liquid diet (broth, tea, or water) for as long as directed by your caregiver. Slowly move to a bland diet as tolerated. SEEK IMMEDIATE MEDICAL CARE IF:   The pain does not go away.  You have a fever.  You keep throwing up (vomiting).  The pain is felt only in portions of the abdomen. Pain in the right side could possibly be appendicitis. In an adult, pain in the left lower portion of the abdomen could be colitis or diverticulitis.  You pass bloody or black tarry stools. MAKE SURE YOU:   Understand these instructions.  Will watch your condition.  Will get help right away if you are not doing well or get worse. Document Released: 06/28/2005 Document Revised: 12/11/2011 Document Reviewed: 05/06/2008 [facility] Patient Information 2013 [place], [place].     Cholelithiasis Cholelithiasis (also called gallstones) is a form of gallbladder disease where gallstones form in your gallbladder. The gallbladder is a non-essential organ that stores bile made in the liver, which helps digest fats. Gallstones begin as small crystals and slowly grow into stones. Gallstone pain occurs when the gallbladder spasms, and a gallstone is blocking  the duct. Pain can also occur when a stone passes out of the duct.  Women are more likely to develop gallstones than men. Other factors that increase the risk of gallbladder disease are:  Having multiple pregnancies. Physicians sometimes advise removing diseased gallbladders before future pregnancies.  Obesity.  Diets heavy in fried foods and fat.  Increasing age (older than 53).  Prolonged use of medications containing female hormones.  Diabetes mellitus.  Rapid weight loss.  Family history of gallstones (heredity). SYMPTOMS  Feeling sick to your stomach (nauseous).  Abdominal pain.  Yellowing of the skin (jaundice).  Sudden pain. It may persist from several minutes to several hours.  Worsening pain with deep breathing or when jarred.  Fever.  Tenderness to the touch. In some cases, when gallstones do not move into the bile duct, people have no pain or symptoms. These are called silent gallstones. TREATMENT In severe cases, emergency surgery may be required. HOME CARE INSTRUCTIONS   Only take over-the-counter or prescription medicines for pain, discomfort, or fever as directed by your caregiver.  Follow a low-fat diet until seen again. Fat causes the gallbladder to contract, which can result in pain.  Follow up as instructed. Attacks are almost always recurrent and surgery is usually required for permanent treatment. SEEK IMMEDIATE MEDICAL CARE IF:   Your pain increases and is not controlled by medications.  You have an oral temperature above 102 F (38.9 C), not controlled by medication.  You develop nausea and vomiting. MAKE SURE YOU:   Understand these instructions.  Will watch your condition.  Will  get help right away if you are not doing well or get worse. Document Released: 09/14/2005 Document Revised: 12/11/2011 Document Reviewed: 11/17/2010 [facility] Patient Information 2013 [place], [place].    Nausea and Vomiting Nausea is a sick feeling that  often comes before throwing up (vomiting). Vomiting is a reflex where stomach contents come out of your mouth. Vomiting can cause severe loss of body fluids (dehydration). Children and elderly adults can become dehydrated quickly, especially if they also have diarrhea. Nausea and vomiting are symptoms of a condition or disease. It is important to find the cause of your symptoms. CAUSES   Direct irritation of the stomach lining. This irritation can result from increased acid production (gastroesophageal reflux disease), infection, food poisoning, taking certain medicines (such as nonsteroidal anti-inflammatory drugs), alcohol use, or tobacco use.  Signals from the brain.These signals could be caused by a headache, heat exposure, an inner ear disturbance, increased pressure in the brain from injury, infection, a tumor, or a concussion, pain, emotional stimulus, or metabolic problems.  An obstruction in the gastrointestinal tract (bowel obstruction).  Illnesses such as diabetes, hepatitis, gallbladder problems, appendicitis, kidney problems, cancer, sepsis, atypical symptoms of a heart attack, or eating disorders.  Medical treatments such as chemotherapy and radiation.  Receiving medicine that makes you sleep (general anesthetic) during surgery. DIAGNOSIS Your caregiver may ask for tests to be done if the problems do not improve after a few days. Tests may also be done if symptoms are severe or if the reason for the nausea and vomiting is not clear. Tests may include:  Urine tests.  Blood tests.  Stool tests.  Cultures (to look for evidence of infection).  X-rays or other imaging studies. Test results can help your caregiver make decisions about treatment or the need for additional tests. TREATMENT You need to stay well hydrated. Drink frequently but in small amounts.You may wish to drink water, sports drinks, clear broth, or eat frozen ice pops or gelatin dessert to help stay  hydrated.When you eat, eating slowly may help prevent nausea.There are also some antinausea medicines that may help prevent nausea. HOME CARE INSTRUCTIONS   Take all medicine as directed by your caregiver.  If you do not have an appetite, do not force yourself to eat. However, you must continue to drink fluids.  If you have an appetite, eat a normal diet unless your caregiver tells you differently.  Eat a variety of complex carbohydrates (rice, wheat, potatoes, bread), lean meats, yogurt, fruits, and vegetables.  Avoid high-fat foods because they are more difficult to digest.  Drink enough water and fluids to keep your urine clear or pale yellow.  If you are dehydrated, ask your caregiver for specific rehydration instructions. Signs of dehydration may include:  Severe thirst.  Dry lips and mouth.  Dizziness.  Dark urine.  Decreasing urine frequency and amount.  Confusion.  Rapid breathing or pulse. SEEK IMMEDIATE MEDICAL CARE IF:   You have blood or brown flecks (like coffee grounds) in your vomit.  You have black or bloody stools.  You have a severe headache or stiff neck.  You are confused.  You have severe abdominal pain.  You have chest pain or trouble breathing.  You do not urinate at least once every 8 hours.  You develop cold or clammy skin.  You continue to vomit for longer than 24 to 48 hours.  You have a fever. MAKE SURE YOU:   Understand these instructions.  Will watch your condition.  Will get  help right away if you are not doing well or get worse. Document Released: 09/18/2005 Document Revised: 12/11/2011 Document Reviewed: 02/15/2011 [facility] Patient Information 2013 [place], [place].

## 2012-12-31 NOTE — ED Notes (Signed)
Patient given water for fluid challenge.

## 2012-12-31 NOTE — ED Notes (Signed)
Patient states she started having stomach pains last night 10/10.  Patient states she started throwing up soon thereafter followed by constant diarrhea.

## 2012-12-31 NOTE — ED Notes (Signed)
Patient ambulatory to restroom.  Patient unable to urinate at this time.  Patient tolerated well.

## 2013-01-08 ENCOUNTER — Ambulatory Visit: Payer: Medicare Other | Admitting: Internal Medicine

## 2013-01-22 ENCOUNTER — Other Ambulatory Visit: Payer: Self-pay

## 2013-01-22 MED ORDER — MONTELUKAST SODIUM 10 MG PO TABS: 10.0000 mg | ORAL_TABLET | Freq: Every day | ORAL | Status: DC

## 2013-01-27 ENCOUNTER — Other Ambulatory Visit: Payer: Self-pay

## 2013-01-27 MED ORDER — LOSARTAN POTASSIUM-HCTZ 100-12.5 MG PO TABS: 30.0000 | ORAL_TABLET | Freq: Every day | ORAL | Status: DC

## 2013-02-28 ENCOUNTER — Other Ambulatory Visit: Payer: Self-pay

## 2013-03-03 ENCOUNTER — Other Ambulatory Visit: Payer: Self-pay | Admitting: *Deleted

## 2013-03-03 MED ORDER — LOSARTAN POTASSIUM-HCTZ 100-12.5 MG PO TABS: 30.0000 | ORAL_TABLET | Freq: Every day | ORAL | Status: DC

## 2013-03-04 ENCOUNTER — Other Ambulatory Visit: Payer: Self-pay | Admitting: *Deleted

## 2013-03-04 MED ORDER — LOSARTAN POTASSIUM-HCTZ 100-12.5 MG PO TABS: 30.0000 | ORAL_TABLET | Freq: Every day | ORAL | Status: DC

## 2013-04-03 ENCOUNTER — Telehealth: Payer: Self-pay | Admitting: Internal Medicine

## 2013-04-03 MED ORDER — BUDESONIDE-FORMOTEROL FUMARATE 80-4.5 MCG/ACT IN AERO: 2.0000 | INHALATION_SPRAY | Freq: Two times a day (BID) | RESPIRATORY_TRACT | Status: DC

## 2013-04-03 NOTE — Telephone Encounter (Signed)
LMOM TCB x1 on home number Called cell # and spoke with patient who is requesting samples of symbicort 80 Advised pt will leave samples up front for her to pick up at her convenience - pt stated she will come today. Pt advised to disregard message on home #  **Due to limited quantity of samples, only able to leave 1 for pt Sample documented per protocol Nothing further needed; will sign off.

## 2013-06-17 ENCOUNTER — Telehealth: Payer: Self-pay | Admitting: Internal Medicine

## 2013-06-17 NOTE — Telephone Encounter (Signed)
Ok to give two samples but she'll need to make an appt before this runs out

## 2013-06-17 NOTE — Telephone Encounter (Signed)
MW---pt stated that she does not want to make an appt at this time.  Pt needs sample of the symbicort but the pt has not been seen in the office with MW since 03/2011.  Please advise if ok to give a sample.  Thanks  Allergies  Allergen Reactions  . Aspirin   . Ramipril     REACTION: Cough

## 2013-06-18 MED ORDER — BUDESONIDE-FORMOTEROL FUMARATE 160-4.5 MCG/ACT IN AERO: 2.0000 | INHALATION_SPRAY | Freq: Two times a day (BID) | RESPIRATORY_TRACT | Status: DC

## 2013-06-18 NOTE — Telephone Encounter (Signed)
That's fine

## 2013-06-18 NOTE — Telephone Encounter (Signed)
Dr. Melvyn Novas-- Patient is prescribed symbicort 80 We are currently out of these samples Can we leave symbicort 160? Please advice, thank you

## 2013-06-18 NOTE — Telephone Encounter (Signed)
Called, spoke with pt - I have placed 2 samples of symbicort 160 at front.  Pt is aware of this, and states she has been on the 160 dose in the past.  Pt is also aware to call back to schedule OV before these samples run out.  She verbalized understanding and was very appreciative of this.

## 2013-07-17 ENCOUNTER — Other Ambulatory Visit: Payer: Self-pay

## 2013-07-17 MED ORDER — AMLODIPINE BESYLATE 10 MG PO TABS: 5.0000 mg | ORAL_TABLET | Freq: Every day | ORAL | Status: DC

## 2013-07-18 ENCOUNTER — Ambulatory Visit (INDEPENDENT_AMBULATORY_CARE_PROVIDER_SITE_OTHER): Payer: Medicare Other

## 2013-07-18 ENCOUNTER — Other Ambulatory Visit: Payer: Self-pay | Admitting: *Deleted

## 2013-07-18 DIAGNOSIS — Z23 Encounter for immunization: Secondary | ICD-10-CM

## 2013-07-18 MED ORDER — AMLODIPINE BESYLATE 10 MG PO TABS: 5.0000 mg | ORAL_TABLET | Freq: Every day | ORAL | Status: DC

## 2013-08-04 ENCOUNTER — Other Ambulatory Visit: Payer: Self-pay | Admitting: *Deleted

## 2013-08-04 MED ORDER — MONTELUKAST SODIUM 10 MG PO TABS: 10.0000 mg | ORAL_TABLET | Freq: Every day | ORAL | Status: DC

## 2013-08-04 MED ORDER — LOSARTAN POTASSIUM-HCTZ 100-12.5 MG PO TABS: 30.0000 | ORAL_TABLET | Freq: Every day | ORAL | Status: DC

## 2013-08-07 ENCOUNTER — Other Ambulatory Visit: Payer: Self-pay

## 2013-08-07 DIAGNOSIS — Z1231 Encounter for screening mammogram for malignant neoplasm of breast: Secondary | ICD-10-CM

## 2013-09-01 ENCOUNTER — Encounter: Payer: Self-pay | Admitting: Endocrinology

## 2013-09-01 ENCOUNTER — Ambulatory Visit (INDEPENDENT_AMBULATORY_CARE_PROVIDER_SITE_OTHER): Payer: Medicare Other | Admitting: Endocrinology

## 2013-09-01 VITALS — BP 122/65 | HR 87 | Temp 98.4°F | Ht 67.0 in | Wt 209.5 lb

## 2013-09-01 DIAGNOSIS — R079 Chest pain, unspecified: Secondary | ICD-10-CM

## 2013-09-01 DIAGNOSIS — E1029 Type 1 diabetes mellitus with other diabetic kidney complication: Secondary | ICD-10-CM

## 2013-09-01 DIAGNOSIS — N390 Urinary tract infection, site not specified: Secondary | ICD-10-CM

## 2013-09-01 DIAGNOSIS — M810 Age-related osteoporosis without current pathological fracture: Secondary | ICD-10-CM

## 2013-09-01 DIAGNOSIS — D509 Iron deficiency anemia, unspecified: Secondary | ICD-10-CM

## 2013-09-01 DIAGNOSIS — Z79899 Other long term (current) drug therapy: Secondary | ICD-10-CM

## 2013-09-01 DIAGNOSIS — I10 Essential (primary) hypertension: Secondary | ICD-10-CM

## 2013-09-01 DIAGNOSIS — E785 Hyperlipidemia, unspecified: Secondary | ICD-10-CM

## 2013-09-01 DIAGNOSIS — I2510 Atherosclerotic heart disease of native coronary artery without angina pectoris: Secondary | ICD-10-CM

## 2013-09-01 LAB — CBC WITH DIFFERENTIAL/PLATELET
Basophils Absolute: 0 10*3/uL (ref 0.0–0.1)
Eosinophils Absolute: 0.1 10*3/uL (ref 0.0–0.7)
Eosinophils Relative: 1.7 % (ref 0.0–5.0)
HCT: 36.7 % (ref 36.0–46.0)
Hemoglobin: 11.7 g/dL — ABNORMAL LOW (ref 12.0–15.0)
Lymphs Abs: 1.5 10*3/uL (ref 0.7–4.0)
MCHC: 32 g/dL (ref 30.0–36.0)
MCV: 81.8 fl (ref 78.0–100.0)
Monocytes Absolute: 0.4 10*3/uL (ref 0.1–1.0)
Monocytes Relative: 5.6 % (ref 3.0–12.0)
Neutro Abs: 5.8 10*3/uL (ref 1.4–7.7)
Platelets: 175 10*3/uL (ref 150.0–400.0)
RDW: 17.1 % — ABNORMAL HIGH (ref 11.5–14.6)
WBC: 7.9 10*3/uL (ref 4.5–10.5)

## 2013-09-01 LAB — BASIC METABOLIC PANEL
BUN: 23 mg/dL (ref 6–23)
Creatinine, Ser: 1.5 mg/dL — ABNORMAL HIGH (ref 0.4–1.2)
GFR: 42.2 mL/min — ABNORMAL LOW (ref >60.00)
Glucose, Bld: 119 mg/dL — ABNORMAL HIGH (ref 70–99)
Potassium: 3.8 mEq/L (ref 3.5–5.1)
Sodium: 141 mEq/L (ref 135–145)

## 2013-09-01 LAB — IBC PANEL
Iron: 71 ug/dL (ref 42–145)
Transferrin: 244.5 mg/dL (ref 212.0–360.0)

## 2013-09-01 LAB — HEPATIC FUNCTION PANEL
ALT: 24 U/L (ref 0–35)
AST: 18 U/L (ref 0–37)
Alkaline Phosphatase: 81 U/L (ref 39–117)
Bilirubin, Direct: 0 mg/dL (ref 0.0–0.3)
Total Protein: 7.2 g/dL (ref 6.0–8.3)

## 2013-09-01 LAB — URINALYSIS, ROUTINE W REFLEX MICROSCOPIC
Bilirubin Urine: NEGATIVE
Hgb urine dipstick: NEGATIVE
Ketones, ur: NEGATIVE
Leukocytes, UA: NEGATIVE
Nitrite: NEGATIVE
Urobilinogen, UA: 0.2 (ref 0.0–1.0)
pH: 6 (ref 5.0–8.0)

## 2013-09-01 LAB — LIPID PANEL
HDL: 59.3 mg/dL (ref >39.00)
LDL Cholesterol: 67 mg/dL (ref 0–99)
Total CHOL/HDL Ratio: 2
VLDL: 14.8 mg/dL (ref 0.0–40.0)

## 2013-09-01 LAB — HEMOGLOBIN A1C: Hgb A1c MFr Bld: 6.9 % — ABNORMAL HIGH (ref 4.6–6.5)

## 2013-09-01 LAB — TSH: TSH: 1.15 u[IU]/mL (ref 0.35–5.50)

## 2013-09-01 LAB — MICROALBUMIN / CREATININE URINE RATIO
Creatinine,U: 119.5 mg/dL
Microalb, Ur: 0.1 mg/dL (ref 0.0–1.9)

## 2013-09-01 MED ORDER — AMLODIPINE BESYLATE 10 MG PO TABS: 5.0000 mg | ORAL_TABLET | Freq: Every day | ORAL | Status: DC

## 2013-09-01 NOTE — Patient Instructions (Addendum)
blood tests are being requested for you today.  We'll contact you with results.  Please come back for a regular physical appointment in 6 weeks (must be after 10/14/13).

## 2013-09-01 NOTE — Progress Notes (Signed)
Subjective:    Patient ID: [name], female    DOB: Dec 06, 1935, 77 y.o.   MRN: [id number]  HPI Pt returns for f/u of IDDM (dx'ed 1996, on a routine blood test; she has mild if any neuropathy of the lower extremities, but she has associated renal insuff; she has been on insulin since 2008; she has never had severe hypoglycemia or DKA).  no cbg record, but states cbg's are highest in the afternoon, and lowest in am. She says it is in general well-controlled.  She does not take fe tabs.   Past Medical History  Diagnosis Date  . Anterior chest wall pain   . Cough   . UTI (urinary tract infection)   . HTN (hypertension)   . Hyperlipidemia   . History of diverticulitis of colon   . DM type 2 (diabetes mellitus, type 2)   . History of colon cancer   . Asthma   . Anxiety   . Iron deficiency anemia   . Allergic rhinitis   . IBS (irritable bowel syndrome)   . GERD (gastroesophageal reflux disease)   . Osteoporosis, unspecified     Past Surgical History  Procedure Laterality Date  . Esophagogastroduodenoscopy  12/26/01  . Cardiovascular stress test  02/25/04  . Right oophorectomy  jan 2010    History   Social History  . Marital Status: Widowed    Spouse Name: N/A    Number of Children: N/A  . Years of Education: N/A   Occupational History  . retired    Social History Main Topics  . Smoking status: Former Smoker -- 0.30 packs/day for 5 years    Types: Cigarettes    Quit date: 10/02/1986  . Smokeless tobacco: Never Used  . Alcohol Use: No  . Drug Use: No  . Sexual Activity: Not on file   Other Topics Concern  . Not on file   Social History Narrative  . No narrative on file    Current Outpatient Prescriptions on File Prior to Visit  Medication Sig Dispense Refill  . acetaminophen (TYLENOL) 325 MG tablet Take 650 mg by mouth every 6 (six) hours as needed for pain.      Marland Kitchen albuterol (PROAIR HFA) 108 (90 BASE) MCG/ACT inhaler Inhale 2 puffs into the lungs every 6 (six)  hours as needed for wheezing.  1 Inhaler  0  . budesonide-formoterol (SYMBICORT) 160-4.5 MCG/ACT inhaler Inhale 2 puffs into the lungs 2 (two) times daily.  2 Inhaler  0  . glucose blood (ONE TOUCH ULTRA TEST) test strip Use as instructed three times daily       . insulin aspart (NOVOLOG) 100 UNIT/ML injection Inject 20-40 Units into the skin 3 (three) times daily before meals. Use 25 units at breakfast, use 20 units at lunch and use 40 units at dinner      . Insulin Syringe-Needle U-100 (BD INSULIN SYRINGE ULTRAFINE) 31G X 5/16" 0.3 ML MISC Use as directed       . losartan-hydrochlorothiazide (HYZAAR) 100-12.5 MG per tablet Take 30 tablets by mouth daily.  90 tablet  0  . montelukast (SINGULAIR) 10 MG tablet Take 1 tablet (10 mg total) by mouth at bedtime.  90 tablet  0  . ondansetron (ZOFRAN-ODT) 8 MG disintegrating tablet Take 1 tablet (8 mg total) by mouth every 12 (twelve) hours as needed for nausea.  20 tablet  0  . ONE TOUCH LANCETS MISC Use as directed three times daily       .  pravastatin (PRAVACHOL) 40 MG tablet Take 40 mg by mouth daily.      . promethazine (PHENERGAN) 25 MG tablet Take 1 tablet (25 mg total) by mouth every 6 (six) hours as needed for nausea.  20 tablet  0  . traMADol (ULTRAM) 50 MG tablet Take 1 tablet (50 mg total) by mouth every 6 (six) hours as needed for pain.  15 tablet  0  . budesonide-formoterol (SYMBICORT) 80-4.5 MCG/ACT inhaler Inhale 2 puffs into the lungs 2 (two) times daily.  1 Inhaler  0   No current facility-administered medications on file prior to visit.    Allergies  Allergen Reactions  . Aspirin   . Ramipril     REACTION: Cough    Family History  Problem Relation Age of Onset  . Atopy Neg Hx   . Asthma Neg Hx   . Lung cancer Brother   . Melanoma Brother     BP 122/65  Pulse 87  Temp(Src) 98.4 F (36.9 C) (Oral)  Ht 5\' 7"  (1.702 m)  Wt 209 lb 8 oz (95.029 kg)  BMI 32.80 kg/m2  SpO2 96%  Review of Systems denies hypoglycemia and  weight change.      Objective:   Physical Exam VITAL SIGNS:  See vs page GENERAL: no distress  Lab Results  Component Value Date   WBC 7.9 09/01/2013   HGB 11.7* 09/01/2013   HCT 36.7 09/01/2013   PLT 175.0 09/01/2013   GLUCOSE 119* 09/01/2013   CHOL 141 09/01/2013   TRIG 74.0 09/01/2013   HDL 59.30 09/01/2013   LDLCALC 67 09/01/2013   ALT 24 09/01/2013   AST 18 09/01/2013   NA 141 09/01/2013   K 3.8 09/01/2013   CL 109 09/01/2013   CREATININE 1.5* 09/01/2013   BUN 23 09/01/2013   CO2 26 09/01/2013   TSH 1.15 09/01/2013   HGBA1C 6.9* 09/01/2013   MICROALBUR 0.1 09/01/2013      Assessment & Plan:  DM: well-controlled Anemia: better off any rx Renal insuff: stable Dyslipidemia: well-controlled

## 2013-09-02 LAB — PTH, INTACT AND CALCIUM: Calcium: 9.3 mg/dL (ref 8.4–10.5)

## 2013-09-03 ENCOUNTER — Other Ambulatory Visit: Payer: Self-pay

## 2013-09-03 MED ORDER — PRAVASTATIN SODIUM 40 MG PO TABS: 40.0000 mg | ORAL_TABLET | Freq: Every day | ORAL | Status: DC

## 2013-09-03 NOTE — Telephone Encounter (Signed)
Pravastatin refill sent to [facility] pharmacy. /met

## 2013-09-10 ENCOUNTER — Ambulatory Visit
Admission: RE | Admit: 2013-09-10 | Discharge: 2013-09-10 | Disposition: A | Discharge status: Home / Self Care | Payer: Medicare Other | Source: Ambulatory Visit

## 2013-09-10 DIAGNOSIS — Z1231 Encounter for screening mammogram for malignant neoplasm of breast: Secondary | ICD-10-CM

## 2013-09-26 ENCOUNTER — Other Ambulatory Visit: Payer: Self-pay

## 2013-09-26 MED ORDER — INSULIN ASPART 100 UNIT/ML ~~LOC~~ SOLN: 20.0000 [IU] | Freq: Three times a day (TID) | SUBCUTANEOUS | Status: DC

## 2013-09-29 ENCOUNTER — Telehealth: Payer: Self-pay

## 2013-09-29 MED ORDER — INSULIN ASPART 100 UNIT/ML ~~LOC~~ SOLN: SUBCUTANEOUS | Status: DC

## 2013-09-29 NOTE — Telephone Encounter (Signed)
Refill on novolog.

## 2013-10-16 ENCOUNTER — Encounter: Payer: Self-pay | Admitting: Internal Medicine

## 2013-10-16 ENCOUNTER — Ambulatory Visit (INDEPENDENT_AMBULATORY_CARE_PROVIDER_SITE_OTHER): Payer: Medicare Other | Admitting: Internal Medicine

## 2013-10-16 VITALS — BP 136/70 | HR 81 | Temp 98.6°F | Ht 65.25 in | Wt 214.0 lb

## 2013-10-16 DIAGNOSIS — J45909 Unspecified asthma, uncomplicated: Secondary | ICD-10-CM

## 2013-10-16 MED ORDER — BUDESONIDE-FORMOTEROL FUMARATE 160-4.5 MCG/ACT IN AERO: 2.0000 | INHALATION_SPRAY | Freq: Two times a day (BID) | RESPIRATORY_TRACT | Status: DC

## 2013-10-16 MED ORDER — PREDNISONE 10 MG PO TABS: ORAL_TABLET | ORAL | Status: DC

## 2013-10-16 MED ORDER — ALBUTEROL SULFATE HFA 108 (90 BASE) MCG/ACT IN AERS: 2.0000 | INHALATION_SPRAY | RESPIRATORY_TRACT | Status: DC | PRN

## 2013-10-16 NOTE — Patient Instructions (Addendum)
Prednisone 10 mg take  4 each am x 2 days,   2 each am x 2 days,  1 each am x 2 days and stop (called in)  If your breathing worsens or you need to use your rescue inhaler more than twice weekly or wake up more than twice a month with any respiratory symptoms or require more than two rescue inhalers per year, we need to see you right away because this means we're not controlling the underlying problem (inflammation) adequately.  Rescue inhalers do not control inflammation and overuse can lead to unnecessary and costly consequences.  They can make you feel better temporarily but eventually they will quit working effectively much as sleep aids lead to more insomnia if used regularly.   In the event of any resp flare Cough use mucinex dm up to 1200 mg  Twice daily Breathing use proair up to every 4h Always start Prilosec 20 mg Take 30-60 min before first meal of the day and Pepcid 20 mg one at bedtime until no longer flaring  GERD (REFLUX)  is an extremely common cause of respiratory symptoms, many times with no significant heartburn at all.    It can be treated with medication, but also with lifestyle changes including avoidance of late meals, excessive alcohol, smoking cessation, and avoid fatty foods, chocolate, peppermint, colas, red wine, and acidic juices such as orange juice.  NO MINT OR MENTHOL PRODUCTS SO NO COUGH DROPS  USE SUGARLESS CANDY INSTEAD (jolley ranchers or Stover's)  NO OIL BASED VITAMINS - use powdered substitutes.

## 2013-10-16 NOTE — Progress Notes (Signed)
Subjective:     Patient ID: [name], female   DOB: 01-18-1936, 78 y.o.   MRN: [id number]  HPI  59  yobf quit smoking 1988 with h/o asthma  And nl baseline pfts ( as of 01/04/2010) who had been using Advair on a p.r.n. basis and noticing increasing dyspnea and need for rescue therapy x one mo when seen 01/06/08 for pulmonary evaluation  so Advair stopped. Began Symbicort 160/4.70mcg 2 puffs two times a day.  Returned 02/13/08 improved with decreased dyspnea and no rescue use   Returned 03/18/08 symptom free no rescue needed, stopped reglan, no flare  November 23, 2009 Followup. Pt states that she had PNA in Jan 2011 and since then has had increased SOB. She states that SOB is only with exertion. She gets SOB walking long distances. She also c/o dry cough x several months. not compliant with symbicort. rec symbicort 160 and work on technique   January 04, 2010 cc her breathing has improved since last seen. She still has occ SOB with exertion "if walks a long way". She c/o "hacking cough" that she relates to having allergies tends to be worse in spring day > night mild itching sneezing runny nose    rec 1) Try lower strength symbicort 80 2 puffs first thing in am and 2 puffs again in pm about 12 hours later and fill the prescription if do as well (since irritates upper airway less at the lower strength 2) If your breathing worsens or you need to use your rescue inhaler (ventolin or nebulized albuterol) more than twice weekly or wake up more than twice a month with any respiratory symptoms or require more than two rescue inhalers per year, we need to see you right away.  3) for allergies try zyrtec or chlortrimeton otc as needed instead of benadryl  03/22/11 ov/Nyaire Denbleyker cc cough and sob good control on lower dose symbicort, only having problems when get out in heat rec No change rx  10/16/2013 f/u ov/Seham Gardenhire re: asthma Chief Complaint  Patient presents with  . Follow-up    Pt c/o increased DOE x 2 wks. She  states "I don't walk too far before I get SOB".  She has also noticed wheezing at night for the past several wks. She is using rescue inhaler on average twice per day.       No obvious day to day or daytime variabilty or assoc chronic cough or cp or chest tightness,   overt sinus or hb symptoms. No unusual exp hx or h/o childhood pna/ asthma or knowledge of premature birth.   Also denies any obvious fluctuation of symptoms with weather or environmental changes or other aggravating or alleviating factors except as outlined above   Current Medications, Allergies, Complete Past Medical History, Past Surgical History, Family History, and Social History were reviewed in [employer] record.  ROS  The following are not active complaints unless bolded sore throat, dysphagia, dental problems, itching, sneezing,  nasal congestion or excess/ purulent secretions, ear ache,   fever, chills, sweats, unintended wt loss, pleuritic or exertional cp, hemoptysis,  orthopnea pnd or leg swelling, presyncope, palpitations, heartburn, abdominal pain, anorexia, nausea, vomiting, diarrhea  or change in bowel or urinary habits, change in stools or urine, dysuria,hematuria,  rash, arthralgias, visual complaints, headache, numbness weakness or ataxia or problems with walking or coordination,  change in mood/affect or memory.         Allergies  1) ! * Altace  2) ! *  Asprin     Past Medical History:   OSTEOPOROSIS (ICD-733.00)  HYPERTENSION (ICD-401.9)  HYPERLIPIDEMIA (ICD-272.4)  DIVERTICULITIS, HX OF (ICD-V12.79)  DIABETES MELLITUS, TYPE II (ICD-250.00)  COLON CANCER, HX OF (ICD-V10.05)  ASTHMA (ICD-493.90)  -PFT's 03/18/08 minimal airflow obstruction  -PFT's January 04, 2010 No sign airflow obstruction  -Mastered HFA technique August 13, 2008 > confimed November 23, 2009  ANXIETY (ICD-300.00)  ANEMIA-IRON DEFICIENCY (ICD-280.9)  ALLERGIC RHINITIS (ICD-477.9)  IBS  GERD             Objective:   Physical Exam   wt 202 November 23, 2009> 199 January 04, 2010 > 209  03/22/11 > 214 10/16/2013   Pt alert, approp amb/ nad  No jvd Oropharanx clear/ top dentures  Neck supple Lungs with a few scattered exp > insp rhonchi bilaterally RRR no s3 or or sign murmur Abd obese with excursion >> Extr wam with no edema or clubbing noted Neuro  No motor deficits     Assessment:

## 2013-10-18 NOTE — Assessment & Plan Note (Signed)
-  PFT's 03/18/08 minimal airflow obstruction  -PFT's January 04, 2010 No sign airflow obstruction  -hfa 90% 10/16/13 p coaching   DDX of  difficult airways managment all start with A and  include Adherence, Ace Inhibitors, Acid Reflux, Active Sinus Disease, Alpha 1 Antitripsin deficiency, Anxiety masquerading as Airways dz,  ABPA,  allergy(esp in young), Aspiration (esp in elderly), Adverse effects of DPI,  Active smokers, plus two Bs  = Bronchiectasis and Beta blocker use..and one C= CHF  Adherence is always the initial "prime suspect" and is a multilayered concern that requires a "trust but verify" approach in every patient - starting with knowing how to use medications, especially inhalers, correctly, keeping up with refills and understanding the fundamental difference between maintenance and prns vs those medications only taken for a very short course and then stopped and not refilled.  - The proper method of use, as well as anticipated side effects, of a metered-dose inhaler are discussed and demonstrated to the patient. Improved effectiveness after extensive coaching during this visit to a level of approximately    ? Acid (or non-acid) GERD > always difficult to exclude as up to 75% of pts in some series report no assoc GI/ Heartburn symptoms> rec max (24h)  acid suppression and diet restrictions/ reviewed and instructions given in writing.   See instructions for specific recommendations which were reviewed directly with the patient who was given a copy with highlighter outlining the key components.

## 2013-10-21 ENCOUNTER — Ambulatory Visit: Payer: Medicare Other | Admitting: Endocrinology

## 2013-10-21 DIAGNOSIS — Z0289 Encounter for other administrative examinations: Secondary | ICD-10-CM

## 2013-11-25 ENCOUNTER — Other Ambulatory Visit: Payer: Self-pay | Admitting: Endocrinology

## 2013-12-09 ENCOUNTER — Encounter: Payer: Self-pay | Admitting: Endocrinology

## 2013-12-09 ENCOUNTER — Ambulatory Visit (INDEPENDENT_AMBULATORY_CARE_PROVIDER_SITE_OTHER): Payer: Medicare Other | Admitting: Endocrinology

## 2013-12-09 VITALS — BP 136/80 | HR 73 | Temp 97.9°F | Ht 65.25 in | Wt 209.0 lb

## 2013-12-09 DIAGNOSIS — E1029 Type 1 diabetes mellitus with other diabetic kidney complication: Secondary | ICD-10-CM

## 2013-12-09 DIAGNOSIS — R112 Nausea with vomiting, unspecified: Secondary | ICD-10-CM

## 2013-12-09 LAB — BASIC METABOLIC PANEL
BUN: 16 mg/dL (ref 6–23)
CHLORIDE: 110 meq/L (ref 96–112)
CO2: 23 mEq/L (ref 19–32)
Calcium: 9.1 mg/dL (ref 8.4–10.5)
Creatinine, Ser: 1.4 mg/dL — ABNORMAL HIGH (ref 0.4–1.2)
GFR: 47.46 mL/min — ABNORMAL LOW (ref >60.00)
GLUCOSE: 181 mg/dL — AB (ref 70–99)
Potassium: 4 mEq/L (ref 3.5–5.1)
Sodium: 141 mEq/L (ref 135–145)

## 2013-12-09 LAB — CBC WITH DIFFERENTIAL/PLATELET
Basophils Absolute: 0 10*3/uL (ref 0.0–0.1)
Basophils Relative: 0.4 % (ref 0.0–3.0)
EOS ABS: 0.2 10*3/uL (ref 0.0–0.7)
Eosinophils Relative: 4 % (ref 0.0–5.0)
HEMATOCRIT: 38 % (ref 36.0–46.0)
HEMOGLOBIN: 12.4 g/dL (ref 12.0–15.0)
LYMPHS ABS: 1.7 10*3/uL (ref 0.7–4.0)
Lymphocytes Relative: 28 % (ref 12.0–46.0)
MCHC: 32.6 g/dL (ref 30.0–36.0)
MCV: 82.4 fl (ref 78.0–100.0)
MONOS PCT: 9.3 % (ref 3.0–12.0)
Monocytes Absolute: 0.6 10*3/uL (ref 0.1–1.0)
NEUTROS ABS: 3.6 10*3/uL (ref 1.4–7.7)
Neutrophils Relative %: 58.3 % (ref 43.0–77.0)
PLATELETS: 183 10*3/uL (ref 150.0–400.0)
RBC: 4.61 Mil/uL (ref 3.87–5.11)
RDW: 17.3 % — AB (ref 11.5–14.6)
WBC: 6.2 10*3/uL (ref 4.5–10.5)

## 2013-12-09 LAB — HEMOGLOBIN A1C: Hgb A1c MFr Bld: 6 % (ref 4.6–6.5)

## 2013-12-09 LAB — AMYLASE: Amylase: 106 U/L (ref 27–131)

## 2013-12-09 MED ORDER — ONDANSETRON 8 MG PO TBDP
8.0000 mg | ORAL_TABLET | Freq: Four times a day (QID) | ORAL | Status: DC | PRN
Start: 2013-12-09 — End: 2014-08-21

## 2013-12-09 MED ORDER — HYDROCODONE-ACETAMINOPHEN 10-325 MG PO TABS: 1.0000 | ORAL_TABLET | Freq: Four times a day (QID) | ORAL | Status: DC | PRN

## 2013-12-09 NOTE — Patient Instructions (Addendum)
blood tests are being requested for you today.  We'll contact you with results.  Please come back soon for a regular physical appointment.   i have sent a prescription to your pharmacy, for the nausea. Here is a prescription for pain medication.

## 2013-12-09 NOTE — Progress Notes (Signed)
Subjective:    Patient ID: [name], female    DOB: 1936/08/20, 78 y.o.   MRN: [id number]  HPI Pt states 4 days of moderate crampy quality pain throughout the abdomen, and assoc n/v.  sxs are improved now.  Multiple family members have a similar illness.   Since the illness started, cbg's have been high.   Past Medical History  Diagnosis Date  . Anterior chest wall pain   . Cough   . UTI (urinary tract infection)   . HTN (hypertension)   . Hyperlipidemia   . History of diverticulitis of colon   . DM type 2 (diabetes mellitus, type 2)   . History of colon cancer   . Asthma   . Anxiety   . Iron deficiency anemia   . Allergic rhinitis   . IBS (irritable bowel syndrome)   . GERD (gastroesophageal reflux disease)   . Osteoporosis, unspecified     Past Surgical History  Procedure Laterality Date  . Esophagogastroduodenoscopy  12/26/01  . Cardiovascular stress test  02/25/04  . Right oophorectomy  jan 2010    History   Social History  . Marital Status: Widowed    Spouse Name: N/A    Number of Children: N/A  . Years of Education: N/A   Occupational History  . retired    Social History Main Topics  . Smoking status: Former Smoker -- 0.30 packs/day for 5 years    Types: Cigarettes    Quit date: 10/02/1986  . Smokeless tobacco: Never Used  . Alcohol Use: No  . Drug Use: No  . Sexual Activity: Not on file   Other Topics Concern  . Not on file   Social History Narrative  . No narrative on file    Current Outpatient Prescriptions on File Prior to Visit  Medication Sig Dispense Refill  . acetaminophen (TYLENOL) 325 MG tablet Take 650 mg by mouth every 6 (six) hours as needed for pain.      Marland Kitchen albuterol (PROAIR HFA) 108 (90 BASE) MCG/ACT inhaler Inhale 2 puffs into the lungs every 4 (four) hours as needed for wheezing.  1 Inhaler  1  . amLODipine (NORVASC) 10 MG tablet Take 0.5 tablets (5 mg total) by mouth daily.  30 tablet  3  . budesonide-formoterol (SYMBICORT)  160-4.5 MCG/ACT inhaler Inhale 2 puffs into the lungs 2 (two) times daily.  1 Inhaler  11  . glucose blood (ONE TOUCH ULTRA TEST) test strip Use as instructed three times daily       . insulin aspart (NOVOLOG) 100 UNIT/ML injection Use 3 times a day before every meal 25-20-40  30 mL  12  . Insulin Syringe-Needle U-100 (BD INSULIN SYRINGE ULTRAFINE) 31G X 5/16" 0.3 ML MISC Use as directed       . losartan-hydrochlorothiazide (HYZAAR) 100-12.5 MG per tablet Take 1 tablet by mouth daily.      Marland Kitchen losartan-hydrochlorothiazide (HYZAAR) 100-12.5 MG per tablet TAKE 1 TABLET EVERY DAY  90 tablet  0  . montelukast (SINGULAIR) 10 MG tablet Take 1 tablet (10 mg total) by mouth at bedtime.  90 tablet  0  . ondansetron (ZOFRAN-ODT) 8 MG disintegrating tablet Take 1 tablet (8 mg total) by mouth every 12 (twelve) hours as needed for nausea.  20 tablet  0  . ONE TOUCH LANCETS MISC Use as directed three times daily       . pravastatin (PRAVACHOL) 40 MG tablet Take 1 tablet (40 mg total) by mouth  daily.  90 tablet  3  . predniSONE (DELTASONE) 10 MG tablet Take  4 each am x 2 days,   2 each am x 2 days,  1 each am x 2 days and stop  14 tablet  0  . traMADol (ULTRAM) 50 MG tablet Take 1 tablet (50 mg total) by mouth every 6 (six) hours as needed for pain.  15 tablet  0   No current facility-administered medications on file prior to visit.    Allergies  Allergen Reactions  . Aspirin   . Ramipril     REACTION: Cough    Family History  Problem Relation Age of Onset  . Atopy Neg Hx   . Asthma Neg Hx   . Lung cancer Brother   . Melanoma Brother     BP 136/80  Pulse 73  Temp(Src) 97.9 F (36.6 C) (Oral)  Ht 5' 5.25" (1.657 m)  Wt 209 lb (94.802 kg)  BMI 34.53 kg/m2  SpO2 98%  Review of Systems She also has diarrhea, but no fever.    Objective:   Physical Exam VITAL SIGNS:  See vs page GENERAL: no distress ABDOMEN: abdomen is soft, nontender.  no hepatosplenomegaly.  not distended.  no hernia.     Lab Results  Component Value Date   HGBA1C 6.0 12/09/2013      Assessment & Plan:  DM: overcontrolled. Renal insuff: this increases the duration of action of insulin. N/v/d, prob viral.  Improved.

## 2014-01-26 ENCOUNTER — Other Ambulatory Visit: Payer: Self-pay

## 2014-01-26 MED ORDER — BUDESONIDE-FORMOTEROL FUMARATE 160-4.5 MCG/ACT IN AERO
2.0000 | INHALATION_SPRAY | Freq: Two times a day (BID) | RESPIRATORY_TRACT | Status: DC
Start: 2014-01-26 — End: 2015-02-04

## 2014-01-29 ENCOUNTER — Ambulatory Visit (INDEPENDENT_AMBULATORY_CARE_PROVIDER_SITE_OTHER): Payer: Medicare Other | Admitting: Endocrinology

## 2014-01-29 ENCOUNTER — Encounter: Payer: Self-pay | Admitting: Endocrinology

## 2014-01-29 VITALS — BP 132/70 | HR 70 | Temp 98.1°F | Ht 62.25 in | Wt 203.0 lb

## 2014-01-29 DIAGNOSIS — R112 Nausea with vomiting, unspecified: Secondary | ICD-10-CM

## 2014-01-29 DIAGNOSIS — R1110 Vomiting, unspecified: Secondary | ICD-10-CM | POA: Insufficient documentation

## 2014-01-29 NOTE — Patient Instructions (Addendum)
Please reduce the insulin to 3 times a day before every meal 20-15-35 units. check your blood sugar twice a day.  vary the time of day when you check, between before the 3 meals, and at bedtime.  also check if you have symptoms of your blood sugar being too high or too low.  please keep a record of the readings and bring it to your next appointment here.  You can write it on any piece of paper.  please call us sooner if your blood sugar goes below 70, or if you have a lot of readings over 200. Let's check a test to see if the diabetes is affecting your stomach.  you will receive a phone call, about a day and time for an appointment.   Please come back soon for a regular physical appointment.   Please continue to take the nausea medication, as needed.

## 2014-01-29 NOTE — Progress Notes (Signed)
Subjective:    Patient ID: [name], female    DOB: 1935-11-14, 78 y.o.   MRN: [id number]  HPI Pt returns for f/u of IDDM (dx'ed 1996, on a routine blood test; she has mild if any neuropathy of the lower extremities, but she has associated renal insuff; she has been on insulin since 2008; she has never had severe hypoglycemia or DKA).  no cbg record, but states cbg's are lowest in the afternoon.   She has 10 days of intermittent n/v.  She had assoc diarrhea, but this is resolved.   Past Medical History  Diagnosis Date  . Anterior chest wall pain   . Cough   . UTI (urinary tract infection)   . HTN (hypertension)   . Hyperlipidemia   . History of diverticulitis of colon   . DM type 2 (diabetes mellitus, type 2)   . History of colon cancer   . Asthma   . Anxiety   . Iron deficiency anemia   . Allergic rhinitis   . IBS (irritable bowel syndrome)   . GERD (gastroesophageal reflux disease)   . Osteoporosis, unspecified     Past Surgical History  Procedure Laterality Date  . Esophagogastroduodenoscopy  12/26/01  . Cardiovascular stress test  02/25/04  . Right oophorectomy  jan 2010    History   Social History  . Marital Status: Widowed    Spouse Name: N/A    Number of Children: N/A  . Years of Education: N/A   Occupational History  . retired    Social History Main Topics  . Smoking status: Former Smoker -- 0.30 packs/day for 5 years    Types: Cigarettes    Quit date: 10/02/1986  . Smokeless tobacco: Never Used  . Alcohol Use: No  . Drug Use: No  . Sexual Activity: Not on file   Other Topics Concern  . Not on file   Social History Narrative  . No narrative on file    Current Outpatient Prescriptions on File Prior to Visit  Medication Sig Dispense Refill  . acetaminophen (TYLENOL) 325 MG tablet Take 650 mg by mouth every 6 (six) hours as needed for pain.      Marland Kitchen albuterol (PROAIR HFA) 108 (90 BASE) MCG/ACT inhaler Inhale 2 puffs into the lungs every 4 (four)  hours as needed for wheezing.  1 Inhaler  1  . amLODipine (NORVASC) 10 MG tablet Take 0.5 tablets (5 mg total) by mouth daily.  30 tablet  3  . budesonide-formoterol (SYMBICORT) 160-4.5 MCG/ACT inhaler Inhale 2 puffs into the lungs 2 (two) times daily.  1 Inhaler  11  . glucose blood (ONE TOUCH ULTRA TEST) test strip Use as instructed three times daily       . HYDROcodone-acetaminophen (NORCO) 10-325 MG per tablet Take 1 tablet by mouth every 6 (six) hours as needed.  30 tablet  0  . Insulin Syringe-Needle U-100 (BD INSULIN SYRINGE ULTRAFINE) 31G X 5/16" 0.3 ML MISC Use as directed       . losartan-hydrochlorothiazide (HYZAAR) 100-12.5 MG per tablet Take 1 tablet by mouth daily.      . montelukast (SINGULAIR) 10 MG tablet Take 1 tablet (10 mg total) by mouth at bedtime.  90 tablet  0  . ondansetron (ZOFRAN-ODT) 8 MG disintegrating tablet Take 1 tablet (8 mg total) by mouth every 6 (six) hours as needed for nausea or vomiting.  30 tablet  1  . ONE TOUCH LANCETS MISC Use as directed three  times daily       . pravastatin (PRAVACHOL) 40 MG tablet Take 1 tablet (40 mg total) by mouth daily.  90 tablet  3  . traMADol (ULTRAM) 50 MG tablet Take 1 tablet (50 mg total) by mouth every 6 (six) hours as needed for pain.  15 tablet  0   No current facility-administered medications on file prior to visit.    Allergies  Allergen Reactions  . Aspirin   . Ramipril     REACTION: Cough    Family History  Problem Relation Age of Onset  . Atopy Neg Hx   . Asthma Neg Hx   . Lung cancer Brother   . Melanoma Brother     BP 132/70  Pulse 70  Temp(Src) 98.1 F (36.7 C) (Oral)  Ht 5' 2.25" (1.581 m)  Wt 203 lb (92.08 kg)  BMI 36.84 kg/m2  SpO2 97%  Review of Systems She denies LOC and weight change.     Objective:   Physical Exam VITAL SIGNS:  See vs page GENERAL: no distress ABDOMEN: abdomen is soft, nontender.  no hepatosplenomegaly.  not distended.  no hernia   Lab Results  Component Value  Date   HGBA1C 6.0 12/09/2013      Assessment & Plan:  Vomiting, recurrent, ? Gastroparesis. DM: overcontrolled

## 2014-02-20 ENCOUNTER — Encounter (HOSPITAL_COMMUNITY)
Admission: RE | Admit: 2014-02-20 | Discharge: 2014-02-20 | Disposition: A | Discharge status: Home / Self Care | Payer: Medicare Other | Source: Ambulatory Visit | Attending: Endocrinology | Admitting: Endocrinology

## 2014-02-20 DIAGNOSIS — R112 Nausea with vomiting, unspecified: Secondary | ICD-10-CM | POA: Insufficient documentation

## 2014-02-20 MED ORDER — TECHNETIUM TC 99M SULFUR COLLOID
2.0000 | Freq: Once | INTRAVENOUS | Status: AC | PRN
  Administered 2014-02-20: 2 via ORAL

## 2014-02-24 ENCOUNTER — Telehealth: Payer: Self-pay | Admitting: Endocrinology

## 2014-02-24 ENCOUNTER — Other Ambulatory Visit: Payer: Self-pay | Admitting: Endocrinology

## 2014-02-24 MED ORDER — LOSARTAN POTASSIUM-HCTZ 100-12.5 MG PO TABS: 1.0000 | ORAL_TABLET | Freq: Every day | ORAL | Status: DC

## 2014-02-24 NOTE — Telephone Encounter (Signed)
Rx refilled.

## 2014-02-24 NOTE — Telephone Encounter (Signed)
Patient would like her Lostartan refilled   Thank You

## 2014-03-25 ENCOUNTER — Other Ambulatory Visit: Payer: Self-pay | Admitting: Endocrinology

## 2014-03-28 ENCOUNTER — Other Ambulatory Visit: Payer: Self-pay | Admitting: Endocrinology

## 2014-04-29 ENCOUNTER — Emergency Department (INDEPENDENT_AMBULATORY_CARE_PROVIDER_SITE_OTHER)
Admission: EM | Admit: 2014-04-29 | Discharge: 2014-04-29 | Disposition: A | Discharge status: Home / Self Care | Payer: Medicare Other | Source: Home / Self Care | Attending: Family Medicine | Admitting: Family Medicine

## 2014-04-29 ENCOUNTER — Encounter (HOSPITAL_COMMUNITY): Payer: Self-pay | Admitting: Emergency Medicine

## 2014-04-29 DIAGNOSIS — I10 Essential (primary) hypertension: Secondary | ICD-10-CM

## 2014-04-29 DIAGNOSIS — R1032 Left lower quadrant pain: Secondary | ICD-10-CM

## 2014-04-29 LAB — POCT URINALYSIS DIP (DEVICE)
Bilirubin Urine: NEGATIVE
GLUCOSE, UA: NEGATIVE mg/dL
Ketones, ur: NEGATIVE mg/dL
Leukocytes, UA: NEGATIVE
Nitrite: NEGATIVE
PH: 7 (ref 5.0–8.0)
PROTEIN: 30 mg/dL — AB
Specific Gravity, Urine: 1.015 (ref 1.005–1.030)
Urobilinogen, UA: 0.2 mg/dL (ref 0.0–1.0)

## 2014-04-29 LAB — COMPREHENSIVE METABOLIC PANEL
ALBUMIN: 3.9 g/dL (ref 3.5–5.2)
ALT: 15 U/L (ref 0–35)
ANION GAP: 16 — AB (ref 5–15)
AST: 19 U/L (ref 0–37)
Alkaline Phosphatase: 84 U/L (ref 39–117)
BUN: 15 mg/dL (ref 6–23)
CALCIUM: 9.1 mg/dL (ref 8.4–10.5)
CO2: 25 mEq/L (ref 19–32)
CREATININE: 1.18 mg/dL — AB (ref 0.50–1.10)
Chloride: 102 mEq/L (ref 96–112)
GFR calc Af Amer: 51 mL/min — ABNORMAL LOW (ref >90)
GFR calc non Af Amer: 44 mL/min — ABNORMAL LOW (ref >90)
Glucose, Bld: 157 mg/dL — ABNORMAL HIGH (ref 70–99)
Potassium: 3.7 mEq/L (ref 3.7–5.3)
Sodium: 143 mEq/L (ref 137–147)
TOTAL PROTEIN: 7.6 g/dL (ref 6.0–8.3)
Total Bilirubin: 0.6 mg/dL (ref 0.3–1.2)

## 2014-04-29 LAB — CBC WITH DIFFERENTIAL/PLATELET
BASOS ABS: 0 10*3/uL (ref 0.0–0.1)
Basophils Relative: 1 % (ref 0–1)
Eosinophils Absolute: 0 10*3/uL (ref 0.0–0.7)
Eosinophils Relative: 0 % (ref 0–5)
HEMATOCRIT: 32 % — AB (ref 36.0–46.0)
HEMOGLOBIN: 11 g/dL — AB (ref 12.0–15.0)
Lymphocytes Relative: 13 % (ref 12–46)
Lymphs Abs: 1.1 10*3/uL (ref 0.7–4.0)
MCH: 26.1 pg (ref 26.0–34.0)
MCHC: 34.4 g/dL (ref 30.0–36.0)
MCV: 75.8 fL — ABNORMAL LOW (ref 78.0–100.0)
MONO ABS: 0.7 10*3/uL (ref 0.1–1.0)
Monocytes Relative: 8 % (ref 3–12)
Neutro Abs: 6.4 10*3/uL (ref 1.7–7.7)
Neutrophils Relative %: 78 % — ABNORMAL HIGH (ref 43–77)
Platelets: 172 10*3/uL (ref 150–400)
RBC: 4.22 MIL/uL (ref 3.87–5.11)
RDW: 18.5 % — ABNORMAL HIGH (ref 11.5–15.5)
WBC: 8.3 10*3/uL (ref 4.0–10.5)

## 2014-04-29 MED ORDER — CIPROFLOXACIN HCL 500 MG PO TABS: 500.0000 mg | ORAL_TABLET | Freq: Two times a day (BID) | ORAL | Status: DC

## 2014-04-29 MED ORDER — ONDANSETRON 4 MG PO TBDP
ORAL_TABLET | ORAL | Status: AC
  Filled 2014-04-29: qty 1

## 2014-04-29 MED ORDER — ONDANSETRON 4 MG PO TBDP: 4.0000 mg | ORAL_TABLET | Freq: Three times a day (TID) | ORAL | Status: DC | PRN

## 2014-04-29 MED ORDER — METRONIDAZOLE 500 MG PO TABS: 500.0000 mg | ORAL_TABLET | Freq: Three times a day (TID) | ORAL | Status: DC

## 2014-04-29 MED ORDER — ONDANSETRON 4 MG PO TBDP
4.0000 mg | ORAL_TABLET | Freq: Once | ORAL | Status: AC
  Administered 2014-04-29: 4 mg via ORAL

## 2014-04-29 NOTE — ED Notes (Signed)
Pt c/o constant LLQ pain x 1 week and vomiting  Has vomited x3 today Sx also include: chills, diaphoresis Denies urinary sx, fevers  Alert w/no signs of acute distress.

## 2014-04-29 NOTE — Discharge Instructions (Signed)
You are likely suffering from another bout of diverticulitis Please start the cipro and flagyl and take them for the full 10 days Please seek immediate attention at the emergency room if you are unable to pass your bowels, your pain becomes significantly worse, or you do not improve (these may be signs of a more serious problem such as a more serious infection, obstruction, gall bladder pain, etc) Please use the zofran as needed for nausea Please drink lots of water.  Use tylenol 1000mg  every 8 hours for pain

## 2014-04-29 NOTE — ED Provider Notes (Signed)
CSN: [id number]     Arrival date & time 04/29/14  [id number] History   None    Chief Complaint  Patient presents with  . Abdominal Pain   (Consider location/radiation/quality/duration/timing/severity/associated sxs/prior Treatment) HPI  LLQ abd pain. Started last week. Improved over the weekend but now returning. Initially intermittent but constant since last night. Denies dysuria, frequency, vaginal pain/irritation/discharge, diarrhea, constipation, rash, CP. Associated w/ feeling of fevers and chills. BM every couple of days which is her normal. Getting worse. Tylenol 1000mg  w/o improvement.    Past Medical History  Diagnosis Date  . Anterior chest wall pain   . Cough   . UTI (urinary tract infection)   . HTN (hypertension)   . Hyperlipidemia   . History of diverticulitis of colon   . DM type 2 (diabetes mellitus, type 2)   . Asthma   . Anxiety   . Iron deficiency anemia   . Allergic rhinitis   . IBS (irritable bowel syndrome)   . GERD (gastroesophageal reflux disease)   . Osteoporosis, unspecified    Past Surgical History  Procedure Laterality Date  . Esophagogastroduodenoscopy  12/26/01  . Cardiovascular stress test  02/25/04  . Right oophorectomy  jan 2010   Family History  Problem Relation Age of Onset  . Atopy Neg Hx   . Asthma Neg Hx   . Lung cancer Brother   . Melanoma Brother    History  Substance Use Topics  . Smoking status: Former Smoker -- 0.30 packs/day for 5 years    Types: Cigarettes    Quit date: 10/02/1986  . Smokeless tobacco: Never Used  . Alcohol Use: No   OB History   Grav Para Term Preterm Abortions TAB SAB Ect Mult Living                 Review of Systems Per HPI with all other pertinent systems negative.   Allergies  Aspirin and Ramipril  Home Medications   Prior to Admission medications   Medication Sig Start Date End Date Taking? Authorizing Provider  amLODipine (NORVASC) 10 MG tablet TAKE 0.5 TABLETS (5 MG TOTAL) BY MOUTH DAILY.  03/25/14  Yes Renato Shin, MD  budesonide-formoterol [facility]) 160-4.5 MCG/ACT inhaler Inhale 2 puffs into the lungs 2 (two) times daily. 01/26/14  Yes Renato Shin, MD  insulin aspart (NOVOLOG) 100 UNIT/ML injection 3 times a day before every meal 20-15-35 units 09/29/13  Yes Renato Shin, MD  Insulin Syringe-Needle U-100 (BD INSULIN SYRINGE ULTRAFINE) 31G X 5/16" 0.3 ML MISC Use as directed    Yes Historical Provider, MD  losartan-hydrochlorothiazide (HYZAAR) 100-12.5 MG per tablet Take 1 tablet by mouth daily. 02/24/14  Yes Renato Shin, MD  montelukast (SINGULAIR) 10 MG tablet Take 1 tablet (10 mg total) by mouth at bedtime. 08/04/13  Yes Renato Shin, MD  pravastatin (PRAVACHOL) 40 MG tablet Take 1 tablet (40 mg total) by mouth daily. 09/03/13  Yes Renato Shin, MD  acetaminophen (TYLENOL) 325 MG tablet Take 650 mg by mouth every 6 (six) hours as needed for pain.    Historical Provider, MD  albuterol (PROAIR HFA) 108 (90 BASE) MCG/ACT inhaler Inhale 2 puffs into the lungs every 4 (four) hours as needed for wheezing. 10/16/13 10/16/14  Tanda Rockers, MD  amLODipine (NORVASC) 10 MG tablet TAKE 0.5 TABLETS (5 MG TOTAL) BY MOUTH DAILY.    Renato Shin, MD  ciprofloxacin (CIPRO) 500 MG tablet Take 1 tablet (500 mg total) by mouth 2 (two) times daily. 04/29/14  Waldemar Dickens, MD  glucose blood (ONE TOUCH ULTRA TEST) test strip Use as instructed three times daily     Historical Provider, MD  HYDROcodone-acetaminophen (NORCO) 10-325 MG per tablet Take 1 tablet by mouth every 6 (six) hours as needed. 12/09/13   Renato Shin, MD  metroNIDAZOLE (FLAGYL) 500 MG tablet Take 1 tablet (500 mg total) by mouth 3 (three) times daily. 04/29/14   Waldemar Dickens, MD  ondansetron (ZOFRAN-ODT) 4 MG disintegrating tablet Take 1 tablet (4 mg total) by mouth every 8 (eight) hours as needed for nausea or vomiting. 04/29/14   Waldemar Dickens, MD  ondansetron (ZOFRAN-ODT) 8 MG disintegrating tablet Take 1 tablet (8 mg total) by  mouth every 6 (six) hours as needed for nausea or vomiting. 12/09/13   Renato Shin, MD  ONE TOUCH LANCETS MISC Use as directed three times daily     Historical Provider, MD  traMADol (ULTRAM) 50 MG tablet Take 1 tablet (50 mg total) by mouth every 6 (six) hours as needed for pain. 12/31/12   Saddie Benders. Ghim, MD   BP 163/76  Pulse 72  Temp(Src) 98.4 F (36.9 C) (Oral)  Resp 20  SpO2 97% Physical Exam  Constitutional: She is oriented to person, place, and time. She appears well-developed and well-nourished.  Lying on exam table in mild discomfort  HENT:  Head: Normocephalic and atraumatic.  Eyes: EOM are normal. Pupils are equal, round, and reactive to light.  Cardiovascular: Normal rate and normal heart sounds.   Pulmonary/Chest: Effort normal and breath sounds normal. No respiratory distress.  Abdominal: Soft. Bowel sounds are normal. She exhibits no distension and no mass. There is no rebound and no guarding.  Mild ttp in LLQ to deep palpation  Musculoskeletal: Normal range of motion.  Neurological: She is alert and oriented to person, place, and time. No cranial nerve deficit. She exhibits normal muscle tone.  Skin: Skin is warm. No rash noted. She is not diaphoretic.  Psychiatric: She has a normal mood and affect. Her behavior is normal. Judgment and thought content normal.    ED Course  Procedures (including critical care time) Labs Review Labs Reviewed  POCT URINALYSIS DIP (DEVICE) - Abnormal; Notable for the following:    Hgb urine dipstick TRACE (*)    Protein, ur 30 (*)    All other components within normal limits  COMPREHENSIVE METABOLIC PANEL  CBC WITH DIFFERENTIAL    Imaging Review No results found.   MDM   1. Left lower quadrant pain   2. Essential hypertension    Abd pain: most likely mild diverticulitis vs viral gastro vs IBS. Unlikely SBO, appendicitis, cholecystitis, or other serious pathology. UA nml. CMET and CBC. zofran ODT in office for nausea. Cr  abnormal so no NSAIDs. Start tylneol 1000mg  Q8. Start cipro, flagyl. Encouraged hydration. Zofran PRN Rx given. Detailed instructions on when to seek emergent medical attention discussed.   HTN: likely elevated secondary to pain. No change at this time. No signs of ACS.   Linna Darner, MD Family Medicine 04/29/2014, 9:48 AM       Waldemar Dickens, MD 04/29/14 [phone]

## 2014-05-02 ENCOUNTER — Encounter (HOSPITAL_COMMUNITY): Payer: Self-pay | Admitting: Emergency Medicine

## 2014-05-02 ENCOUNTER — Other Ambulatory Visit: Payer: Self-pay | Admitting: Endocrinology

## 2014-05-02 ENCOUNTER — Emergency Department (HOSPITAL_COMMUNITY)
Admission: EM | Admit: 2014-05-02 | Discharge: 2014-05-03 | Disposition: A | Discharge status: Home / Self Care | Payer: Medicare Other | Attending: Emergency Medicine | Admitting: Emergency Medicine

## 2014-05-02 DIAGNOSIS — Z87440 Personal history of urinary (tract) infections: Secondary | ICD-10-CM | POA: Diagnosis not present

## 2014-05-02 DIAGNOSIS — K5900 Constipation, unspecified: Secondary | ICD-10-CM | POA: Diagnosis not present

## 2014-05-02 DIAGNOSIS — E860 Dehydration: Secondary | ICD-10-CM | POA: Diagnosis not present

## 2014-05-02 DIAGNOSIS — Z8739 Personal history of other diseases of the musculoskeletal system and connective tissue: Secondary | ICD-10-CM | POA: Diagnosis not present

## 2014-05-02 DIAGNOSIS — Z862 Personal history of diseases of the blood and blood-forming organs and certain disorders involving the immune mechanism: Secondary | ICD-10-CM | POA: Diagnosis not present

## 2014-05-02 DIAGNOSIS — Z794 Long term (current) use of insulin: Secondary | ICD-10-CM | POA: Insufficient documentation

## 2014-05-02 DIAGNOSIS — IMO0002 Reserved for concepts with insufficient information to code with codable children: Secondary | ICD-10-CM | POA: Diagnosis not present

## 2014-05-02 DIAGNOSIS — Z79899 Other long term (current) drug therapy: Secondary | ICD-10-CM | POA: Diagnosis not present

## 2014-05-02 DIAGNOSIS — K589 Irritable bowel syndrome without diarrhea: Secondary | ICD-10-CM | POA: Diagnosis not present

## 2014-05-02 DIAGNOSIS — Z87891 Personal history of nicotine dependence: Secondary | ICD-10-CM | POA: Insufficient documentation

## 2014-05-02 DIAGNOSIS — I4891 Unspecified atrial fibrillation: Secondary | ICD-10-CM | POA: Diagnosis not present

## 2014-05-02 DIAGNOSIS — N179 Acute kidney failure, unspecified: Secondary | ICD-10-CM | POA: Diagnosis not present

## 2014-05-02 DIAGNOSIS — Z9079 Acquired absence of other genital organ(s): Secondary | ICD-10-CM | POA: Insufficient documentation

## 2014-05-02 DIAGNOSIS — E785 Hyperlipidemia, unspecified: Secondary | ICD-10-CM | POA: Diagnosis not present

## 2014-05-02 DIAGNOSIS — Z792 Long term (current) use of antibiotics: Secondary | ICD-10-CM | POA: Diagnosis not present

## 2014-05-02 DIAGNOSIS — R112 Nausea with vomiting, unspecified: Secondary | ICD-10-CM | POA: Insufficient documentation

## 2014-05-02 DIAGNOSIS — J45909 Unspecified asthma, uncomplicated: Secondary | ICD-10-CM | POA: Diagnosis not present

## 2014-05-02 DIAGNOSIS — E119 Type 2 diabetes mellitus without complications: Secondary | ICD-10-CM | POA: Diagnosis not present

## 2014-05-02 DIAGNOSIS — Z9889 Other specified postprocedural states: Secondary | ICD-10-CM | POA: Diagnosis not present

## 2014-05-02 DIAGNOSIS — R1032 Left lower quadrant pain: Secondary | ICD-10-CM | POA: Insufficient documentation

## 2014-05-02 LAB — CBC WITH DIFFERENTIAL/PLATELET
Basophils Absolute: 0 10*3/uL (ref 0.0–0.1)
Basophils Relative: 0 % (ref 0–1)
Eosinophils Absolute: 0.2 10*3/uL (ref 0.0–0.7)
Eosinophils Relative: 2 % (ref 0–5)
HCT: 32.2 % — ABNORMAL LOW (ref 36.0–46.0)
Hemoglobin: 11.3 g/dL — ABNORMAL LOW (ref 12.0–15.0)
LYMPHS PCT: 22 % (ref 12–46)
Lymphs Abs: 1.9 10*3/uL (ref 0.7–4.0)
MCH: 25.9 pg — ABNORMAL LOW (ref 26.0–34.0)
MCHC: 35.1 g/dL (ref 30.0–36.0)
MCV: 73.7 fL — AB (ref 78.0–100.0)
MONO ABS: 0.9 10*3/uL (ref 0.1–1.0)
Monocytes Relative: 10 % (ref 3–12)
NEUTROS ABS: 5.4 10*3/uL (ref 1.7–7.7)
Neutrophils Relative %: 66 % (ref 43–77)
PLATELETS: 230 10*3/uL (ref 150–400)
RBC: 4.37 MIL/uL (ref 3.87–5.11)
RDW: 17.7 % — ABNORMAL HIGH (ref 11.5–15.5)
WBC: 8.4 10*3/uL (ref 4.0–10.5)

## 2014-05-02 LAB — COMPREHENSIVE METABOLIC PANEL
ALT: 11 U/L (ref 0–35)
AST: 17 U/L (ref 0–37)
Albumin: 3.8 g/dL (ref 3.5–5.2)
Alkaline Phosphatase: 80 U/L (ref 39–117)
Anion gap: 18 — ABNORMAL HIGH (ref 5–15)
BILIRUBIN TOTAL: 0.8 mg/dL (ref 0.3–1.2)
BUN: 22 mg/dL (ref 6–23)
CALCIUM: 8.6 mg/dL (ref 8.4–10.5)
CHLORIDE: 100 meq/L (ref 96–112)
CO2: 19 meq/L (ref 19–32)
Creatinine, Ser: 1.89 mg/dL — ABNORMAL HIGH (ref 0.50–1.10)
GFR, EST AFRICAN AMERICAN: 29 mL/min — AB (ref >90)
GFR, EST NON AFRICAN AMERICAN: 25 mL/min — AB (ref >90)
GLUCOSE: 173 mg/dL — AB (ref 70–99)
Potassium: 3.5 mEq/L — ABNORMAL LOW (ref 3.7–5.3)
SODIUM: 137 meq/L (ref 137–147)
Total Protein: 7.7 g/dL (ref 6.0–8.3)

## 2014-05-02 LAB — URINALYSIS, ROUTINE W REFLEX MICROSCOPIC
BILIRUBIN URINE: NEGATIVE
GLUCOSE, UA: NEGATIVE mg/dL
HGB URINE DIPSTICK: NEGATIVE
KETONES UR: NEGATIVE mg/dL
Leukocytes, UA: NEGATIVE
Nitrite: NEGATIVE
Protein, ur: NEGATIVE mg/dL
Specific Gravity, Urine: 1.013 (ref 1.005–1.030)
UROBILINOGEN UA: 1 mg/dL (ref 0.0–1.0)
pH: 6 (ref 5.0–8.0)

## 2014-05-02 LAB — POC OCCULT BLOOD, ED: Fecal Occult Bld: NEGATIVE

## 2014-05-02 MED ORDER — ACETAMINOPHEN 500 MG PO TABS
1000.0000 mg | ORAL_TABLET | Freq: Once | ORAL | Status: AC
  Administered 2014-05-02: 1000 mg via ORAL
  Filled 2014-05-02: qty 2

## 2014-05-02 MED ORDER — SODIUM CHLORIDE 0.9 % IV SOLN
Freq: Once | INTRAVENOUS | Status: AC
  Administered 2014-05-02: via INTRAVENOUS

## 2014-05-02 NOTE — ED Notes (Signed)
Resident at bedside.  

## 2014-05-02 NOTE — ED Notes (Signed)
Pt states she has hx of diverticulitis.  Pt went to [facility] Wednesday and was given Rx for ABX.  Pt states she initially felt better, but the pain is coming back LLQ.

## 2014-05-02 NOTE — ED Provider Notes (Signed)
CSN: [id number]     Arrival date & time 05/02/14  1953 History   First MD Initiated Contact with Patient 05/02/14 2237   History provided by patient and daughter at bedside.   Chief Complaint  Patient presents with  . Abdominal Pain   HPI  Patient reports abdominal pain for about 1 week. Described initial onset of LLQ without significant associated symptoms and some gradual improvement. Symptoms returned with nausea / vomiting and worsening LLQ, presented to [facility] on 04/29/14, considered to be likely acute mild diverticulitis flare given Cipro PO, Flagyl PO, and Zofran. Overall significant improvement x 2-3 days with resolved nausea/vomiting, tolerated clear liquid diet but overall decreased PO. Today experienced return of LLQ abdominal pain described as "aching", ate some grits today (first regular food), also complains of constipation without BM since last Sunday, has tried Miralax for several days without relief, passing gas today. Admits to good urine output. Denies any blood in BMs or rectal bleeding, chest pain, shortness of breath, HA, fevers/chills. Denies sick contacts.  Additionally, denies any prior hx of cardiac disease or Atrial Fibrillation, never followed by Cardiologist. Denies any palpitations or heart racing.  Past Medical History  Diagnosis Date  . Anterior chest wall pain   . Cough   . UTI (urinary tract infection)   . HTN (hypertension)   . Hyperlipidemia   . History of diverticulitis of colon   . DM type 2 (diabetes mellitus, type 2)   . Asthma   . Anxiety   . Iron deficiency anemia   . Allergic rhinitis   . IBS (irritable bowel syndrome)   . GERD (gastroesophageal reflux disease)   . Osteoporosis, unspecified    Past Surgical History  Procedure Laterality Date  . Esophagogastroduodenoscopy  12/26/01  . Cardiovascular stress test  02/25/04  . Right oophorectomy  jan 2010   Family History  Problem Relation Age of Onset  . Atopy Neg Hx   . Asthma Neg  Hx   . Lung cancer Brother   . Melanoma Brother    History  Substance Use Topics  . Smoking status: Former Smoker -- 0.30 packs/day for 5 years    Types: Cigarettes    Quit date: 10/02/1986  . Smokeless tobacco: Never Used  . Alcohol Use: No   OB History   Grav Para Term Preterm Abortions TAB SAB Ect Mult Living                 Review of Systems See above HPI   Allergies  Aspirin and Ramipril  Home Medications   Prior to Admission medications   Medication Sig Start Date End Date Taking? Authorizing Provider  acetaminophen (TYLENOL) 325 MG tablet Take 650 mg by mouth every 6 (six) hours as needed for pain.    Historical Provider, MD  albuterol (PROAIR HFA) 108 (90 BASE) MCG/ACT inhaler Inhale 2 puffs into the lungs every 4 (four) hours as needed for wheezing. 10/16/13 10/16/14  Tanda Rockers, MD  amLODipine (NORVASC) 10 MG tablet TAKE 0.5 TABLETS (5 MG TOTAL) BY MOUTH DAILY. 03/25/14   Renato Shin, MD  amLODipine (NORVASC) 10 MG tablet TAKE 0.5 TABLETS (5 MG TOTAL) BY MOUTH DAILY.    Renato Shin, MD  budesonide-formoterol [facility]) 160-4.5 MCG/ACT inhaler Inhale 2 puffs into the lungs 2 (two) times daily. 01/26/14   Renato Shin, MD  ciprofloxacin (CIPRO) 500 MG tablet Take 1 tablet (500 mg total) by mouth 2 (two) times daily. 04/29/14   Grayling Congress  Merrell, MD  glucose blood (ONE TOUCH ULTRA TEST) test strip Use as instructed three times daily     Historical Provider, MD  HYDROcodone-acetaminophen (NORCO) 10-325 MG per tablet Take 1 tablet by mouth every 6 (six) hours as needed. 12/09/13   Renato Shin, MD  insulin aspart (NOVOLOG) 100 UNIT/ML injection 3 times a day before every meal 20-15-35 units 09/29/13   Renato Shin, MD  Insulin Syringe-Needle U-100 (BD INSULIN SYRINGE ULTRAFINE) 31G X 5/16" 0.3 ML MISC Use as directed     Historical Provider, MD  losartan-hydrochlorothiazide (HYZAAR) 100-12.5 MG per tablet Take 1 tablet by mouth daily. 02/24/14   Renato Shin, MD   metroNIDAZOLE (FLAGYL) 500 MG tablet Take 1 tablet (500 mg total) by mouth 3 (three) times daily. 04/29/14   Waldemar Dickens, MD  montelukast (SINGULAIR) 10 MG tablet Take 1 tablet (10 mg total) by mouth at bedtime. 08/04/13   Renato Shin, MD  ondansetron (ZOFRAN-ODT) 4 MG disintegrating tablet Take 1 tablet (4 mg total) by mouth every 8 (eight) hours as needed for nausea or vomiting. 04/29/14   Waldemar Dickens, MD  ondansetron (ZOFRAN-ODT) 8 MG disintegrating tablet Take 1 tablet (8 mg total) by mouth every 6 (six) hours as needed for nausea or vomiting. 12/09/13   Renato Shin, MD  ONE TOUCH LANCETS MISC Use as directed three times daily     Historical Provider, MD  pravastatin (PRAVACHOL) 40 MG tablet Take 1 tablet (40 mg total) by mouth daily. 09/03/13   Renato Shin, MD  traMADol (ULTRAM) 50 MG tablet Take 1 tablet (50 mg total) by mouth every 6 (six) hours as needed for pain. 12/31/12   Saddie Benders. Ghim, MD   BP 141/51  Pulse 71  Temp(Src) 98.2 F (36.8 C) (Oral)  Resp 19  Ht 5\' 4"  (1.626 m)  Wt 203 lb (92.08 kg)  BMI 34.83 kg/m2  SpO2 100% Physical Exam  Gen - well-appearing elderly female, cooperative, some discomfort but NAD HEENT - PERRL, EOMI, oropharynx clear, mild dry MM Neck - supple Heart - irregularly irregular, normal rate, no murmurs heard Lungs - CTAB, infrequent scattered high-pitched exp wheezes without focal crackles. Nml WOB Abd - obese, soft, mod tender LLQ to palpation, non-distended, no rebound, +hypoactive BS Ext - non-tender, no edema, peripheral pulses intact +2 b/l Rectal - firm stool in rectal vault, no gross blood Skin - warm, dry Neuro - awake, alert, oriented, grossly non-focal, intact muscle strength 5/5 b/l   ED Course  Procedures (including critical care time) Labs Review Labs Reviewed  CBC WITH DIFFERENTIAL - Abnormal; Notable for the following:    Hemoglobin 11.3 (*)    HCT 32.2 (*)    MCV 73.7 (*)    MCH 25.9 (*)    RDW 17.7 (*)    All other  components within normal limits  COMPREHENSIVE METABOLIC PANEL - Abnormal; Notable for the following:    Potassium 3.5 (*)    Glucose, Bld 173 (*)    Creatinine, Ser 1.89 (*)    GFR calc non Af Amer 25 (*)    GFR calc Af Amer 29 (*)    Anion gap 18 (*)    All other components within normal limits  URINALYSIS, ROUTINE W REFLEX MICROSCOPIC    Imaging Review No results found.   EKG Interpretation   Date/Time:  Saturday May 02 2014 21:27:14 EDT Ventricular Rate:  57 PR Interval:    QRS Duration: 134 QT Interval:  464 QTC Calculation:  452 R Axis:   98 Text Interpretation:  Atrial fibrillation Right bundle branch block Since  last tracing RBBB is new Abnormal ECG Confirmed by Eulis Foster  MD, Vira Agar  [phone]) on 05/02/2014 9:55:00 PM      MDM   Final diagnoses:  None    78 yr old F with PMH known diverticulitis, DM, HTN, asthma, who presents with persistent LLQ abd pain, assoc with n/v (since improved on anti-emetics), and constipation w/o BM x 1 week. Recent UC visit currently on oral antibiotics (Flagyl / Cipro) with some improvement, now worsening. No bloody BMs, no acute abdomen (pain improved with Tylenol). Additionally with hyperglycemia, new onset AFib on EKG (correlated with exam) with new RBB without RVR (denies any chest pain or SOB). Also, with AKI with acute elevated Cr 1.89 (from 1.18 in 3 days), likely related to mild dehydration with decreased PO intake. Suspect abdominal pain likely due to constipation with possible impacted stool, possible diverticulitis however afebrile, no elevated WBC, clinically well-appearing with stable vitals, less likely acute diverticulitis.  Proceed with IVF rehydration for suspected dehydration and mild AKI, Tylenol PO for pain.  UPDATE @ 2322 - Rectal exam with firm stool in rectal vault, no gross blood. FOBT collected. UA (neg nit, neg leuk, no evidence of infection), CBC with nml WBC 8.4, CMET with above noted elevated Cr and hyperglycemic  to 170s, otherwise unremarkable.  UPDATE @ 0005 - Ordered Fleets enema x 1 in ED. Plan to discharge to home with Fleets enema rx, advised inc Miralax to BID, tolerating PO hydration / clear liquid diet and advance to high fiber diet as tolerated. Complete previously rx'd antibiotics. Follow-up with PCP for new AFib and repeat serum Cr for follow-up kidney function.     Nobie Putnam, DO 05/03/14 [place], DO 05/03/14 [id number]

## 2014-05-02 NOTE — ED Notes (Signed)
Pt denies any cardiac hx; pt currently in Afib via cardiac monitor; NT shot EKG

## 2014-05-03 MED ORDER — FLEET ENEMA 7-19 GM/118ML RE ENEM: 1.0000 | ENEMA | Freq: Two times a day (BID) | RECTAL | Status: DC | PRN

## 2014-05-03 MED ORDER — FLEET ENEMA 7-19 GM/118ML RE ENEM
1.0000 | ENEMA | Freq: Once | RECTAL | Status: AC
  Administered 2014-05-03: 1 via RECTAL
  Filled 2014-05-03: qty 1

## 2014-05-03 NOTE — Discharge Instructions (Signed)
It looks like you have some severe constipation, most likely causing your lower abdominal pain. Your labs look good and show no signs of infection. We do not think that this is diverticulitis. However, we recommend that you complete your entire course of antibiotics. Please finish the Cipro and Flagyl antibiotics as previously prescribed. You received 1 Fleets enema here. Given prescription for Fleets enema, start using 2 enemas daily for the next 2-3 days. Increase dose of Miralax - start taking TWICE daily. Continue drinking plenty of fluids, may continue clear liquid diet, but encourage to start regular high fiber diet back as soon as tolerated.  If you don't have a bowel movement within 2 days, I would recommend going back to your doctor or Urgent care to get checked out. Also, if you have worsening symptoms, fever/chills, return nausea / vomiting or new symptoms, please seek immediate medical attention.  Constipation Constipation is when a person has fewer than three bowel movements a week, has difficulty having a bowel movement, or has stools that are dry, hard, or larger than normal. As people grow older, constipation is more common. If you try to fix constipation with medicines that make you have a bowel movement (laxatives), the problem may get worse. Long-term laxative use may cause the muscles of the colon to become weak. A low-fiber diet, not taking in enough fluids, and taking certain medicines may make constipation worse.  CAUSES   Certain medicines, such as antidepressants, pain medicine, iron supplements, antacids, and water pills.   Certain diseases, such as diabetes, irritable bowel syndrome (IBS), thyroid disease, or depression.   Not drinking enough water.   Not eating enough fiber-rich foods.   Stress or travel.   Lack of physical activity or exercise.   Ignoring the urge to have a bowel movement.   Using laxatives too much.  SIGNS AND SYMPTOMS   Having fewer  than three bowel movements a week.   Straining to have a bowel movement.   Having stools that are hard, dry, or larger than normal.   Feeling full or bloated.   Pain in the lower abdomen.   Not feeling relief after having a bowel movement.  DIAGNOSIS  Your health care provider will take a medical history and perform a physical exam. Further testing may be done for severe constipation. Some tests may include:  A barium enema X-ray to examine your rectum, colon, and, sometimes, your small intestine.   A sigmoidoscopy to examine your lower colon.   A colonoscopy to examine your entire colon. TREATMENT  Treatment will depend on the severity of your constipation and what is causing it. Some dietary treatments include drinking more fluids and eating more fiber-rich foods. Lifestyle treatments may include regular exercise. If these diet and lifestyle recommendations do not help, your health care provider may recommend taking over-the-counter laxative medicines to help you have bowel movements. Prescription medicines may be prescribed if over-the-counter medicines do not work.  HOME CARE INSTRUCTIONS   Eat foods that have a lot of fiber, such as fruits, vegetables, whole grains, and beans.  Limit foods high in fat and processed sugars, such as french fries, hamburgers, cookies, candies, and soda.   A fiber supplement may be added to your diet if you cannot get enough fiber from foods.   Drink enough fluids to keep your urine clear or pale yellow.   Exercise regularly or as directed by your health care provider.   Go to the restroom when you have the  urge to go. Do not hold it.   Only take over-the-counter or prescription medicines as directed by your health care provider. Do not take other medicines for constipation without talking to your health care provider first.  [place] IF:   You have bright red blood in your stool.   Your constipation lasts  for more than 4 days or gets worse.   You have abdominal or rectal pain.   You have thin, pencil-like stools.   You have unexplained weight loss. MAKE SURE YOU:   Understand these instructions.  Will watch your condition.  Will get help right away if you are not doing well or get worse. Document Released: 06/16/2004 Document Revised: 09/23/2013 Document Reviewed: 06/30/2013 [facility] Patient Information 2015 [place], [place]. This information is not intended to replace advice given to you by your health care provider. Make sure you discuss any questions you have with your health care provider.

## 2014-05-03 NOTE — ED Provider Notes (Signed)
Patient seen and evaluated with resident. Pertinent history and physical examination performed. Agree with initial assessment, evaluation and treatment initiated by resident. Face-to-face examination and review of evaluation findings were performed.  History includes-Constipation for 1 week with no stools. Chronic intermittant constipation. No fever or vomiting. Tolerating oral fluids. Physical Includes-Alert elderly female. Abdomen- Normal BS, soft, mild LLQ TTP  EKG-  EKG Interpretation  Date/Time:  Saturday May 02 2014 21:27:14 EDT Ventricular Rate:  57 PR Interval:    QRS Duration: 134 QT Interval:  464 QTC Calculation: 452 R Axis:   98 Text Interpretation:  Atrial fibrillation Right bundle branch block Since last tracing RBBB is new Abnormal ECG Confirmed by Eulis Foster  MD, Vira Agar [id number]) on 05/02/2014 9:55:00 PM        Disposition-D/C with symptomatic care. F/U with PCP re: new AF and CRI    Richarda Blade, MD 05/03/14 1630

## 2014-05-03 NOTE — ED Notes (Signed)
Placed portable commode at bedside.

## 2014-05-28 ENCOUNTER — Encounter: Payer: Self-pay | Admitting: Endocrinology

## 2014-05-28 ENCOUNTER — Ambulatory Visit (INDEPENDENT_AMBULATORY_CARE_PROVIDER_SITE_OTHER): Payer: Medicare Other | Admitting: Endocrinology

## 2014-05-28 VITALS — BP 134/76 | HR 61 | Temp 98.1°F | Ht 64.0 in | Wt 205.0 lb

## 2014-05-28 DIAGNOSIS — E1029 Type 1 diabetes mellitus with other diabetic kidney complication: Secondary | ICD-10-CM

## 2014-05-28 DIAGNOSIS — D509 Iron deficiency anemia, unspecified: Secondary | ICD-10-CM

## 2014-05-28 DIAGNOSIS — I10 Essential (primary) hypertension: Secondary | ICD-10-CM

## 2014-05-28 DIAGNOSIS — Z860100 Personal history of colon polyps, unspecified: Secondary | ICD-10-CM

## 2014-05-28 DIAGNOSIS — E785 Hyperlipidemia, unspecified: Secondary | ICD-10-CM

## 2014-05-28 DIAGNOSIS — M810 Age-related osteoporosis without current pathological fracture: Secondary | ICD-10-CM

## 2014-05-28 DIAGNOSIS — Z0000 Encounter for general adult medical examination without abnormal findings: Secondary | ICD-10-CM

## 2014-05-28 DIAGNOSIS — I4891 Unspecified atrial fibrillation: Secondary | ICD-10-CM

## 2014-05-28 LAB — CBC WITH DIFFERENTIAL/PLATELET
BASOS ABS: 0 10*3/uL (ref 0.0–0.1)
BASOS PCT: 0.5 % (ref 0.0–3.0)
EOS ABS: 0.2 10*3/uL (ref 0.0–0.7)
Eosinophils Relative: 3.6 % (ref 0.0–5.0)
HEMATOCRIT: 32.8 % — AB (ref 36.0–46.0)
Hemoglobin: 10.4 g/dL — ABNORMAL LOW (ref 12.0–15.0)
Lymphocytes Relative: 23.7 % (ref 12.0–46.0)
Lymphs Abs: 1.6 10*3/uL (ref 0.7–4.0)
MCHC: 31.6 g/dL (ref 30.0–36.0)
MCV: 82.1 fl (ref 78.0–100.0)
Monocytes Absolute: 0.4 10*3/uL (ref 0.1–1.0)
Monocytes Relative: 6.3 % (ref 3.0–12.0)
NEUTROS ABS: 4.5 10*3/uL (ref 1.4–7.7)
Neutrophils Relative %: 65.9 % (ref 43.0–77.0)
Platelets: 180 10*3/uL (ref 150.0–400.0)
RBC: 4 Mil/uL (ref 3.87–5.11)
RDW: 19.4 % — AB (ref 11.5–15.5)
WBC: 6.8 10*3/uL (ref 4.0–10.5)

## 2014-05-28 LAB — BASIC METABOLIC PANEL
BUN: 15 mg/dL (ref 6–23)
CHLORIDE: 109 meq/L (ref 96–112)
CO2: 22 meq/L (ref 19–32)
Calcium: 9.3 mg/dL (ref 8.4–10.5)
Creatinine, Ser: 1.4 mg/dL — ABNORMAL HIGH (ref 0.4–1.2)
GFR: 49.02 mL/min — ABNORMAL LOW (ref >60.00)
GLUCOSE: 114 mg/dL — AB (ref 70–99)
Potassium: 4 mEq/L (ref 3.5–5.1)
SODIUM: 142 meq/L (ref 135–145)

## 2014-05-28 LAB — HEMOGLOBIN A1C: Hgb A1c MFr Bld: 4.8 % (ref 4.6–6.5)

## 2014-05-28 LAB — URINALYSIS, ROUTINE W REFLEX MICROSCOPIC
BILIRUBIN URINE: NEGATIVE
Hgb urine dipstick: NEGATIVE
KETONES UR: NEGATIVE
Leukocytes, UA: NEGATIVE
Nitrite: NEGATIVE
PH: 6.5 (ref 5.0–8.0)
RBC / HPF: NONE SEEN (ref 0–?)
Specific Gravity, Urine: <=1.005 — AB (ref 1.000–1.030)
Total Protein, Urine: NEGATIVE
UROBILINOGEN UA: 0.2 (ref 0.0–1.0)
Urine Glucose: NEGATIVE
WBC, UA: NONE SEEN (ref 0–?)

## 2014-05-28 LAB — HEPATIC FUNCTION PANEL
ALBUMIN: 3.5 g/dL (ref 3.5–5.2)
ALK PHOS: 68 U/L (ref 39–117)
ALT: 19 U/L (ref 0–35)
AST: 22 U/L (ref 0–37)
Bilirubin, Direct: 0 mg/dL (ref 0.0–0.3)
TOTAL PROTEIN: 7.3 g/dL (ref 6.0–8.3)
Total Bilirubin: 1 mg/dL (ref 0.2–1.2)

## 2014-05-28 LAB — LIPID PANEL
Cholesterol: 99 mg/dL (ref 0–200)
HDL: 38.6 mg/dL — ABNORMAL LOW (ref >39.00)
LDL Cholesterol: 41 mg/dL (ref 0–99)
NonHDL: 60.4
TRIGLYCERIDES: 99 mg/dL (ref 0.0–149.0)
Total CHOL/HDL Ratio: 3
VLDL: 19.8 mg/dL (ref 0.0–40.0)

## 2014-05-28 LAB — MICROALBUMIN / CREATININE URINE RATIO
Creatinine,U: 80.4 mg/dL
MICROALB/CREAT RATIO: 9.7 mg/g (ref 0.0–30.0)
Microalb, Ur: 7.8 mg/dL — ABNORMAL HIGH (ref 0.0–1.9)

## 2014-05-28 LAB — TSH: TSH: 1.19 u[IU]/mL (ref 0.35–4.50)

## 2014-05-28 LAB — IBC PANEL
Iron: 72 ug/dL (ref 42–145)
Saturation Ratios: 26.3 % (ref 20.0–50.0)
TRANSFERRIN: 195.2 mg/dL — AB (ref 212.0–360.0)

## 2014-05-28 MED ORDER — HYDROCODONE-ACETAMINOPHEN 10-325 MG PO TABS: 1.0000 | ORAL_TABLET | Freq: Four times a day (QID) | ORAL | Status: DC | PRN

## 2014-05-28 MED ORDER — ONDANSETRON 4 MG PO TBDP: 4.0000 mg | ORAL_TABLET | Freq: Three times a day (TID) | ORAL | Status: DC | PRN

## 2014-05-28 NOTE — Patient Instructions (Addendum)
Please go back to see drs medoff and ross.  you will receive a phone call, about days and times for appointments. check your blood sugar twice a day.  vary the time of day when you check, between before the 3 meals, and at bedtime.  also check if you have symptoms of your blood sugar being too high or too low.  please keep a record of the readings and bring it to your next appointment here.  You can write it on any piece of paper.  please call us sooner if your blood sugar goes below 70, or if you have a lot of readings over 200. blood tests are being requested for you today.  We'll contact you with results.  Please come back soon for a regular physical appointment.

## 2014-05-28 NOTE — Progress Notes (Signed)
Subjective:    Patient ID: [name], female    DOB: August 18, 1936, 78 y.o.   MRN: [id number]  HPI The state of at least three ongoing medical problems is addressed today, with interval history of each noted here: AF was noted at recent ER visit.  We are unable to repeat ecg today, due to lack of supplies.  Denies sob. Chart says pt has h/o colon cancer, but pathol report only says colon polyps.  Denies BRBPR. Pt returns for f/u of IDDM (dx'ed [id number], complicated by renal insuff; she has been on insulin since 2008; she has never had severe hypoglycemia or DKA). no cbg record, but states cbg's are sometimes low, in the afternoon, and in the middle of the night.   Past Medical History  Diagnosis Date  . Anterior chest wall pain   . Cough   . UTI (urinary tract infection)   . HTN (hypertension)   . Hyperlipidemia   . History of diverticulitis of colon   . DM type 2 (diabetes mellitus, type 2)   . Asthma   . Anxiety   . Iron deficiency anemia   . Allergic rhinitis   . IBS (irritable bowel syndrome)   . GERD (gastroesophageal reflux disease)   . Osteoporosis, unspecified     Past Surgical History  Procedure Laterality Date  . Esophagogastroduodenoscopy  12/26/01  . Cardiovascular stress test  02/25/04  . Right oophorectomy  jan 2010    History   Social History  . Marital Status: Widowed    Spouse Name: N/A    Number of Children: N/A  . Years of Education: N/A   Occupational History  . retired    Social History Main Topics  . Smoking status: Former Smoker -- 0.30 packs/day for 5 years    Types: Cigarettes    Quit date: 10/02/1986  . Smokeless tobacco: Never Used  . Alcohol Use: No  . Drug Use: No  . Sexual Activity: Not on file   Other Topics Concern  . Not on file   Social History Narrative  . No narrative on file    Current Outpatient Prescriptions on File Prior to Visit  Medication Sig Dispense Refill  . acetaminophen (TYLENOL) 325 MG tablet Take 650 mg by  mouth every 6 (six) hours as needed for pain.      Marland Kitchen albuterol (PROAIR HFA) 108 (90 BASE) MCG/ACT inhaler Inhale 2 puffs into the lungs every 4 (four) hours as needed for wheezing.  1 Inhaler  1  . amLODipine (NORVASC) 10 MG tablet TAKE 0.5 TABLETS (5 MG TOTAL) BY MOUTH DAILY.  30 tablet  3  . budesonide-formoterol (SYMBICORT) 160-4.5 MCG/ACT inhaler Inhale 2 puffs into the lungs 2 (two) times daily.  1 Inhaler  11  . ciprofloxacin (CIPRO) 500 MG tablet Take 1 tablet (500 mg total) by mouth 2 (two) times daily.  20 tablet  0  . glucose blood (ONE TOUCH ULTRA TEST) test strip Use as instructed three times daily       . insulin aspart (NOVOLOG) 100 UNIT/ML injection 3 times a day before every meal 07-06-24 units      . Insulin Syringe-Needle U-100 (BD INSULIN SYRINGE ULTRAFINE) 31G X 5/16" 0.3 ML MISC Use as directed       . metroNIDAZOLE (FLAGYL) 500 MG tablet Take 1 tablet (500 mg total) by mouth 3 (three) times daily.  30 tablet  0  . montelukast (SINGULAIR) 10 MG tablet TAKE 1 TABLET (10 MG  TOTAL) BY MOUTH AT BEDTIME.  90 tablet  0  . ondansetron (ZOFRAN-ODT) 8 MG disintegrating tablet Take 1 tablet (8 mg total) by mouth every 6 (six) hours as needed for nausea or vomiting.  30 tablet  1  . ONE TOUCH LANCETS MISC Use as directed three times daily       . pravastatin (PRAVACHOL) 40 MG tablet Take 1 tablet (40 mg total) by mouth daily.  90 tablet  3  . sodium phosphate (FLEET) 7-19 GM/118ML ENEM Place 133 mLs (1 enema total) rectally 2 (two) times daily as needed for moderate constipation or severe constipation.  6 enema  0  . traMADol (ULTRAM) 50 MG tablet Take 1 tablet (50 mg total) by mouth every 6 (six) hours as needed for pain.  15 tablet  0   No current facility-administered medications on file prior to visit.    Allergies  Allergen Reactions  . Aspirin   . Ramipril     REACTION: Cough    Family History  Problem Relation Age of Onset  . Atopy Neg Hx   . Asthma Neg Hx   . Lung  cancer Brother   . Melanoma Brother     BP 134/76  Pulse 61  Temp(Src) 98.1 F (36.7 C) (Oral)  Ht 5\' 4"  (1.626 m)  Wt 205 lb (92.987 kg)  BMI 35.17 kg/m2  SpO2 96%   Review of Systems Denies n/v, LOC, and weight change.      Objective:   Physical Exam VITAL SIGNS:  See vs page GENERAL: no distress Pulses: dorsalis pedis intact bilat.   Feet: no deformity. normal color and temp.  no edema.  There is bilateral onychomycosis.   Skin:  no ulcer on the feet.   Neuro: sensation is intact to touch on the feet   Lab Results  Component Value Date   HGBA1C 4.8 05/28/2014      Assessment & Plan:  BRBPR, new AF, new DM: overcontrolled   Patient is advised the following: Patient Instructions  Please go back to see drs medoff and ross.  you will receive a phone call, about days and times for appointments. check your blood sugar twice a day.  vary the time of day when you check, between before the 3 meals, and at bedtime.  also check if you have symptoms of your blood sugar being too high or too low.  please keep a record of the readings and bring it to your next appointment here.  You can write it on any piece of paper.  please call us sooner if your blood sugar goes below 70, or if you have a lot of readings over 200. blood tests are being requested for you today.  We'll contact you with results.  Please come back soon for a regular physical appointment.     reduce the insulin to 3 times a day before every meal 07-06-24 units.

## 2014-05-29 ENCOUNTER — Other Ambulatory Visit: Payer: Self-pay | Admitting: Endocrinology

## 2014-05-29 LAB — PTH, INTACT AND CALCIUM
Calcium: 9.4 mg/dL (ref 8.4–10.5)
PTH: 76 pg/mL — AB (ref 14–64)

## 2014-06-16 ENCOUNTER — Encounter: Payer: Self-pay | Admitting: Endocrinology

## 2014-06-16 ENCOUNTER — Ambulatory Visit (INDEPENDENT_AMBULATORY_CARE_PROVIDER_SITE_OTHER): Payer: Medicare Other | Admitting: Endocrinology

## 2014-06-16 VITALS — BP 120/62 | HR 59 | Temp 98.1°F | Ht 64.0 in | Wt 199.0 lb

## 2014-06-16 DIAGNOSIS — Z23 Encounter for immunization: Secondary | ICD-10-CM

## 2014-06-16 DIAGNOSIS — M810 Age-related osteoporosis without current pathological fracture: Secondary | ICD-10-CM

## 2014-06-16 NOTE — Patient Instructions (Signed)
You need 3 more vaccinations.  Please call when you want to get these. please consider these measures for your health:  minimize alcohol.  do not use tobacco products.  have a colonoscopy at least every 10 years from age 76.  Women should have an annual mammogram from age 45.  keep firearms safely stored.  always use seat belts.  have working smoke alarms in your home.  see an eye doctor and dentist regularly.  never drive under the influence of alcohol or drugs (including prescription drugs).   it is critically important to prevent falling down (keep floor areas well-lit, dry, and free of loose objects.  If you have a cane, walker, or wheelchair, you should use it, even for short trips around the house.  Also, try not to rush)

## 2014-06-16 NOTE — Progress Notes (Signed)
Subjective:    Patient ID: [name], female    DOB: 03-06-1936, 78 y.o.   MRN: [id number]  HPI Pt is here for regular wellness examination, and is feeling pretty well in general, and says chronic med probs are stable, except as noted below.  She declines vaccines today Past Medical History  Diagnosis Date  . Anterior chest wall pain   . Cough   . UTI (urinary tract infection)   . HTN (hypertension)   . Hyperlipidemia   . History of diverticulitis of colon   . DM type 2 (diabetes mellitus, type 2)   . Asthma   . Anxiety   . Iron deficiency anemia   . Allergic rhinitis   . IBS (irritable bowel syndrome)   . GERD (gastroesophageal reflux disease)   . Osteoporosis, unspecified     Past Surgical History  Procedure Laterality Date  . Esophagogastroduodenoscopy  12/26/01  . Cardiovascular stress test  02/25/04  . Right oophorectomy  jan 2010    History   Social History  . Marital Status: Widowed    Spouse Name: N/A    Number of Children: N/A  . Years of Education: N/A   Occupational History  . retired    Social History Main Topics  . Smoking status: Former Smoker -- 0.30 packs/day for 5 years    Types: Cigarettes    Quit date: 10/02/1986  . Smokeless tobacco: Never Used  . Alcohol Use: No  . Drug Use: No  . Sexual Activity: Not on file   Other Topics Concern  . Not on file   Social History Narrative  . No narrative on file    Current Outpatient Prescriptions on File Prior to Visit  Medication Sig Dispense Refill  . acetaminophen (TYLENOL) 325 MG tablet Take 650 mg by mouth every 6 (six) hours as needed for pain.      Marland Kitchen albuterol (PROAIR HFA) 108 (90 BASE) MCG/ACT inhaler Inhale 2 puffs into the lungs every 4 (four) hours as needed for wheezing.  1 Inhaler  1  . amLODipine (NORVASC) 10 MG tablet TAKE 0.5 TABLETS (5 MG TOTAL) BY MOUTH DAILY.  30 tablet  3  . budesonide-formoterol (SYMBICORT) 160-4.5 MCG/ACT inhaler Inhale 2 puffs into the lungs 2 (two) times  daily.  1 Inhaler  11  . ciprofloxacin (CIPRO) 500 MG tablet Take 1 tablet (500 mg total) by mouth 2 (two) times daily.  20 tablet  0  . glucose blood (ONE TOUCH ULTRA TEST) test strip Use as instructed three times daily       . HYDROcodone-acetaminophen (NORCO) 10-325 MG per tablet Take 1 tablet by mouth every 6 (six) hours as needed.  30 tablet  0  . insulin aspart (NOVOLOG) 100 UNIT/ML injection 3 times a day before every meal 07-06-24 units      . Insulin Syringe-Needle U-100 (BD INSULIN SYRINGE ULTRAFINE) 31G X 5/16" 0.3 ML MISC Use as directed       . losartan-hydrochlorothiazide (HYZAAR) 100-12.5 MG per tablet TAKE 1 TABLET BY MOUTH EVERY DAY  30 tablet  2  . metroNIDAZOLE (FLAGYL) 500 MG tablet Take 1 tablet (500 mg total) by mouth 3 (three) times daily.  30 tablet  0  . montelukast (SINGULAIR) 10 MG tablet TAKE 1 TABLET (10 MG TOTAL) BY MOUTH AT BEDTIME.  90 tablet  0  . ondansetron (ZOFRAN-ODT) 4 MG disintegrating tablet Take 1 tablet (4 mg total) by mouth every 8 (eight) hours as needed for  nausea or vomiting.  20 tablet  0  . ondansetron (ZOFRAN-ODT) 8 MG disintegrating tablet Take 1 tablet (8 mg total) by mouth every 6 (six) hours as needed for nausea or vomiting.  30 tablet  1  . ONE TOUCH LANCETS MISC Use as directed three times daily       . pravastatin (PRAVACHOL) 40 MG tablet Take 1 tablet (40 mg total) by mouth daily.  90 tablet  3  . sodium phosphate (FLEET) 7-19 GM/118ML ENEM Place 133 mLs (1 enema total) rectally 2 (two) times daily as needed for moderate constipation or severe constipation.  6 enema  0  . traMADol (ULTRAM) 50 MG tablet Take 1 tablet (50 mg total) by mouth every 6 (six) hours as needed for pain.  15 tablet  0   No current facility-administered medications on file prior to visit.    Allergies  Allergen Reactions  . Aspirin   . Ramipril     REACTION: Cough    Family History  Problem Relation Age of Onset  . Atopy Neg Hx   . Asthma Neg Hx   . Lung  cancer Brother   . Melanoma Brother     BP 120/62  Pulse 59  Temp(Src) 98.1 F (36.7 C) (Oral)  Ht 5\' 4"  (1.626 m)  Wt 199 lb (90.266 kg)  BMI 34.14 kg/m2  SpO2 98%  Review of Systems  Constitutional: Negative for fever and unexpected weight change.  HENT: Negative for hearing loss.   Eyes: Negative for visual disturbance.  Respiratory: Negative for shortness of breath.   Cardiovascular: Negative for chest pain.  Gastrointestinal: Negative for anal bleeding.  Endocrine: Negative for cold intolerance.  Genitourinary: Negative for hematuria.  Musculoskeletal: Negative for gait problem.  Skin: Negative for rash.  Allergic/Immunologic: Positive for environmental allergies.  Neurological: Negative for syncope.  Hematological: Does not bruise/bleed easily.  Psychiatric/Behavioral: Negative for dysphoric mood.       Objective:   Physical Exam VS: see vs page GEN: no distress HEAD: head: no deformity eyes: no periorbital swelling, no proptosis external nose and ears are normal mouth: no lesion seen NECK: supple, thyroid is not enlarged CHEST WALL: no deformity LUNGS:  Clear to auscultation CV: reg rate and rhythm, no murmur ABD: abdomen is soft, nontender.  no hepatosplenomegaly.  not distended.  no hernia MUSCULOSKELETAL: muscle bulk and strength are grossly normal.  no obvious joint swelling.  gait is normal and steady EXTEMITIES: no deformity.  no ulcer on the feet.  feet are of normal color and temp.  no edema.  There is bilateral onychomycosis. PULSES: dorsalis pedis intact bilat.  no carotid bruit NEURO:  cn 2-12 grossly intact.   readily moves all 4's.  sensation is intact to touch on the feet SKIN:  Normal texture and temperature.  No rash or suspicious lesion is visible.   NODES:  None palpable at the neck PSYCH: alert, well-oriented.  Does not appear anxious nor depressed.      Assessment & Plan:  Wellness visit today, with problems stable, except as  noted. we discussed code status.  pt requests full code, but would not want to be started or maintained on artificial life-support measures if there was not a reasonable chance of recovery Patient is advised the following: Patient Instructions  You need 3 more vaccinations.  Please call when you want to get these. please consider these measures for your health:  minimize alcohol.  do not use tobacco products.  have a  colonoscopy at least every 10 years from age 10.  Women should have an annual mammogram from age 46.  keep firearms safely stored.  always use seat belts.  have working smoke alarms in your home.  see an eye doctor and dentist regularly.  never drive under the influence of alcohol or drugs (including prescription drugs).   it is critically important to prevent falling down (keep floor areas well-lit, dry, and free of loose objects.  If you have a cane, walker, or wheelchair, you should use it, even for short trips around the house.  Also, try not to rush)

## 2014-06-19 ENCOUNTER — Telehealth: Payer: Self-pay | Admitting: Endocrinology

## 2014-06-19 NOTE — Telephone Encounter (Signed)
i would be happy to see you in the office today or Monday.  Also, the old office is open sat am.

## 2014-06-19 NOTE — Telephone Encounter (Signed)
Patient asked you to call her back.

## 2014-06-19 NOTE — Telephone Encounter (Signed)
Pt called stating that since her office visit she has not been feeling well. Pt states she has been feeling nauseated and has vomited a few times. Pt wanted to know if she should just wait this out or if something can be done. Please advise, Thanks!

## 2014-06-19 NOTE — Telephone Encounter (Signed)
Pt advised. She states the she may go to the clinic on Saturday. If pt is still feeling bad on Monday she will call office back to schedule.

## 2014-06-20 ENCOUNTER — Telehealth: Payer: Self-pay | Admitting: Critical Care Medicine

## 2014-06-20 NOTE — Telephone Encounter (Signed)
Pt called with nausea after flu vaccine  This is not related to flu vaccine  Pt advised to call PCP on call.

## 2014-06-22 ENCOUNTER — Ambulatory Visit (INDEPENDENT_AMBULATORY_CARE_PROVIDER_SITE_OTHER): Payer: Medicare Other | Admitting: Endocrinology

## 2014-06-22 ENCOUNTER — Encounter: Payer: Self-pay | Admitting: Endocrinology

## 2014-06-22 VITALS — BP 124/62 | HR 75 | Temp 98.5°F | Ht 64.0 in | Wt 198.0 lb

## 2014-06-22 DIAGNOSIS — B370 Candidal stomatitis: Secondary | ICD-10-CM

## 2014-06-22 MED ORDER — CLOTRIMAZOLE 10 MG MT TROC: 10.0000 mg | Freq: Every day | OROMUCOSAL | Status: DC

## 2014-06-22 NOTE — Progress Notes (Signed)
Subjective:    Patient ID: [name], female    DOB: [birth year], 78 y.o.   MRN: [id number]  HPI Pt states 1 week of slight pain at the inside of the mouth, and assoc nausea.  She says it is not periodontal pain.   Past Medical History  Diagnosis Date  . Anterior chest wall pain   . Cough   . UTI (urinary tract infection)   . HTN (hypertension)   . Hyperlipidemia   . History of diverticulitis of colon   . DM type 2 (diabetes mellitus, type 2)   . Asthma   . Anxiety   . Iron deficiency anemia   . Allergic rhinitis   . IBS (irritable bowel syndrome)   . GERD (gastroesophageal reflux disease)   . Osteoporosis, unspecified     Past Surgical History  Procedure Laterality Date  . Esophagogastroduodenoscopy  12/26/01  . Cardiovascular stress test  02/25/04  . Right oophorectomy  jan 2010    History   Social History  . Marital Status: Widowed    Spouse Name: N/A    Number of Children: N/A  . Years of Education: N/A   Occupational History  . retired    Social History Main Topics  . Smoking status: Former Smoker -- 0.30 packs/day for 5 years    Types: Cigarettes    Quit date: 10/02/1986  . Smokeless tobacco: Never Used  . Alcohol Use: No  . Drug Use: No  . Sexual Activity: Not on file   Other Topics Concern  . Not on file   Social History Narrative  . No narrative on file    Current Outpatient Prescriptions on File Prior to Visit  Medication Sig Dispense Refill  . acetaminophen (TYLENOL) 325 MG tablet Take 650 mg by mouth every 6 (six) hours as needed for pain.      Marland Kitchen albuterol (PROAIR HFA) 108 (90 BASE) MCG/ACT inhaler Inhale 2 puffs into the lungs every 4 (four) hours as needed for wheezing.  1 Inhaler  1  . amLODipine (NORVASC) 10 MG tablet TAKE 0.5 TABLETS (5 MG TOTAL) BY MOUTH DAILY.  30 tablet  3  . budesonide-formoterol (SYMBICORT) 160-4.5 MCG/ACT inhaler Inhale 2 puffs into the lungs 2 (two) times daily.  1 Inhaler  11  . ciprofloxacin (CIPRO) 500 MG  tablet Take 1 tablet (500 mg total) by mouth 2 (two) times daily.  20 tablet  0  . glucose blood (ONE TOUCH ULTRA TEST) test strip Use as instructed three times daily       . HYDROcodone-acetaminophen (NORCO) 10-325 MG per tablet Take 1 tablet by mouth every 6 (six) hours as needed.  30 tablet  0  . insulin aspart (NOVOLOG) 100 UNIT/ML injection 3 times a day before every meal 07-06-24 units      . Insulin Syringe-Needle U-100 (BD INSULIN SYRINGE ULTRAFINE) 31G X 5/16" 0.3 ML MISC Use as directed       . losartan-hydrochlorothiazide (HYZAAR) 100-12.5 MG per tablet TAKE 1 TABLET BY MOUTH EVERY DAY  30 tablet  2  . metroNIDAZOLE (FLAGYL) 500 MG tablet Take 1 tablet (500 mg total) by mouth 3 (three) times daily.  30 tablet  0  . montelukast (SINGULAIR) 10 MG tablet TAKE 1 TABLET (10 MG TOTAL) BY MOUTH AT BEDTIME.  90 tablet  0  . ondansetron (ZOFRAN-ODT) 4 MG disintegrating tablet Take 1 tablet (4 mg total) by mouth every 8 (eight) hours as needed for nausea or vomiting.  20 tablet  0  . ondansetron (ZOFRAN-ODT) 8 MG disintegrating tablet Take 1 tablet (8 mg total) by mouth every 6 (six) hours as needed for nausea or vomiting.  30 tablet  1  . ONE TOUCH LANCETS MISC Use as directed three times daily       . pravastatin (PRAVACHOL) 40 MG tablet Take 1 tablet (40 mg total) by mouth daily.  90 tablet  3  . sodium phosphate (FLEET) 7-19 GM/118ML ENEM Place 133 mLs (1 enema total) rectally 2 (two) times daily as needed for moderate constipation or severe constipation.  6 enema  0  . traMADol (ULTRAM) 50 MG tablet Take 1 tablet (50 mg total) by mouth every 6 (six) hours as needed for pain.  15 tablet  0   No current facility-administered medications on file prior to visit.    Allergies  Allergen Reactions  . Aspirin   . Ramipril     REACTION: Cough    Family History  Problem Relation Age of Onset  . Atopy Neg Hx   . Asthma Neg Hx   . Lung cancer Brother   . Melanoma Brother     BP 124/62   Pulse 75  Temp(Src) 98.5 F (36.9 C) (Oral)  Ht 5\' 4"  (1.626 m)  Wt 198 lb (89.812 kg)  BMI 33.97 kg/m2  SpO2 98%   Review of Systems Denies fever and earache.      Objective:   Physical Exam VITAL SIGNS:  See vs page GENERAL: no distress head: no deformity eyes: no periorbital swelling, no proptosis external nose and ears are normal. mouth: no lesion seen, except for small amount of white exudate.   Both eac's and tm's are normal.     Assessment & Plan:  Oral pain and exudate, new, possibly due to thrush  Patient is advised the following: Patient Instructions  i have sent a prescription to your pharmacy, for mouth lozenges.   I hope you feel better soon.  If you don't feel better by next week, please call back.

## 2014-06-22 NOTE — Patient Instructions (Addendum)
i have sent a prescription to your pharmacy, for mouth lozenges.   I hope you feel better soon.  If you don't feel better by next week, please call back.

## 2014-06-24 ENCOUNTER — Ambulatory Visit (INDEPENDENT_AMBULATORY_CARE_PROVIDER_SITE_OTHER)
Admission: RE | Admit: 2014-06-24 | Discharge: 2014-06-24 | Disposition: A | Discharge status: Home / Self Care | Payer: Medicare Other | Source: Ambulatory Visit | Attending: Endocrinology | Admitting: Endocrinology

## 2014-06-24 DIAGNOSIS — M810 Age-related osteoporosis without current pathological fracture: Secondary | ICD-10-CM

## 2014-07-01 ENCOUNTER — Telehealth: Payer: Self-pay | Admitting: Endocrinology

## 2014-07-01 NOTE — Telephone Encounter (Signed)
Pt states there was no voicemail left but that there where two calls from our office please call her if you needed her. Thank you!

## 2014-07-01 NOTE — Telephone Encounter (Signed)
Called pt and advised of recent lab work and bone density results.

## 2014-07-24 ENCOUNTER — Telehealth: Payer: Self-pay | Admitting: Endocrinology

## 2014-07-24 MED ORDER — ONETOUCH ULTRA 2 W/DEVICE KIT: PACK | Status: DC

## 2014-07-24 NOTE — Telephone Encounter (Signed)
Lvom advising pt that we did not have the meter she has been using. Rx for pt's meter sent to pharmacy. Pt advised via voicemail.

## 2014-07-24 NOTE — Telephone Encounter (Signed)
Can the pt have one of the one touch meters or a replacement hers is broken

## 2014-08-05 ENCOUNTER — Other Ambulatory Visit: Payer: Self-pay | Admitting: Endocrinology

## 2014-08-21 ENCOUNTER — Encounter: Payer: Self-pay | Admitting: Internal Medicine

## 2014-08-21 ENCOUNTER — Ambulatory Visit (INDEPENDENT_AMBULATORY_CARE_PROVIDER_SITE_OTHER): Payer: Medicare Other | Admitting: Internal Medicine

## 2014-08-21 VITALS — BP 146/84 | HR 58 | Ht 64.0 in | Wt 202.6 lb

## 2014-08-21 DIAGNOSIS — I4891 Unspecified atrial fibrillation: Secondary | ICD-10-CM

## 2014-08-21 LAB — CBC
HEMATOCRIT: 37.7 % (ref 36.0–46.0)
HEMOGLOBIN: 11.9 g/dL — AB (ref 12.0–15.0)
MCHC: 31.5 g/dL (ref 30.0–36.0)
MCV: 84.7 fl (ref 78.0–100.0)
Platelets: 194 10*3/uL (ref 150.0–400.0)
RBC: 4.45 Mil/uL (ref 3.87–5.11)
RDW: 18.9 % — ABNORMAL HIGH (ref 11.5–15.5)
WBC: 7.8 10*3/uL (ref 4.0–10.5)

## 2014-08-21 LAB — TSH: TSH: 1.42 u[IU]/mL (ref 0.35–4.50)

## 2014-08-21 LAB — BASIC METABOLIC PANEL
BUN: 21 mg/dL (ref 6–23)
CALCIUM: 9.4 mg/dL (ref 8.4–10.5)
CO2: 26 mEq/L (ref 19–32)
CREATININE: 1.4 mg/dL — AB (ref 0.4–1.2)
Chloride: 108 mEq/L (ref 96–112)
GFR: 46.6 mL/min — AB (ref >60.00)
GLUCOSE: 123 mg/dL — AB (ref 70–99)
Potassium: 3.7 mEq/L (ref 3.5–5.1)
Sodium: 142 mEq/L (ref 135–145)

## 2014-08-21 MED ORDER — APIXABAN 5 MG PO TABS: 5.0000 mg | ORAL_TABLET | Freq: Two times a day (BID) | ORAL | Status: DC

## 2014-08-21 NOTE — Progress Notes (Signed)
HPI  Patinet is a 78 yo who I saw in 2012  She has a histroy of HTN, DM, coronary calcifications   Had hot flashes   I have not seen since then   She was referred for atrial fibrillaton  This was noted on an EKG she had done in the ER in Aug (went in for Abdominal pain) Patient follows with Cordelia Pen  Last seen earlier this fall  No EKG done    She denies palpitations.  No dizziness  No SOB  No CP  Is active  Helps with grandchildren   Allergies  Allergen Reactions  . Aspirin   . Ramipril     REACTION: Cough    Current Outpatient Prescriptions  Medication Sig Dispense Refill  . acetaminophen (TYLENOL) 325 MG tablet Take 650 mg by mouth every 6 (six) hours as needed for pain.    Marland Kitchen albuterol (PROAIR HFA) 108 (90 BASE) MCG/ACT inhaler Inhale 2 puffs into the lungs every 4 (four) hours as needed for wheezing. 1 Inhaler 1  . amLODipine (NORVASC) 10 MG tablet TAKE 0.5 TABLETS (5 MG TOTAL) BY MOUTH DAILY. 30 tablet 3  . Blood Glucose Monitoring Suppl (ONE TOUCH ULTRA 2) W/DEVICE KIT Check blood sugar 3 times per day. 1 each 0  . budesonide-formoterol (SYMBICORT) 160-4.5 MCG/ACT inhaler Inhale 2 puffs into the lungs 2 (two) times daily. 1 Inhaler 11  . clotrimazole (MYCELEX) 10 MG troche Take 1 tablet (10 mg total) by mouth 5 (five) times daily. 35 tablet 1  . glucose blood (ONE TOUCH ULTRA TEST) test strip Use as instructed three times daily     . HYDROcodone-acetaminophen (NORCO) 10-325 MG per tablet Take 1 tablet by mouth every 6 (six) hours as needed. 30 tablet 0  . insulin aspart (NOVOLOG) 100 UNIT/ML injection 3 times a day before every meal 07-06-24 units    . Insulin Syringe-Needle U-100 (BD INSULIN SYRINGE ULTRAFINE) 31G X 5/16" 0.3 ML MISC Use as directed     . losartan-hydrochlorothiazide (HYZAAR) 100-12.5 MG per tablet TAKE 1 TABLET BY MOUTH EVERY DAY 30 tablet 2  . montelukast (SINGULAIR) 10 MG tablet TAKE 1 TABLET (10 MG TOTAL) BY MOUTH AT BEDTIME. 90 tablet 0  . ondansetron  (ZOFRAN-ODT) 4 MG disintegrating tablet Take 1 tablet (4 mg total) by mouth every 8 (eight) hours as needed for nausea or vomiting. 20 tablet 0  . ONE TOUCH LANCETS MISC Use as directed three times daily     . pravastatin (PRAVACHOL) 40 MG tablet Take 1 tablet (40 mg total) by mouth daily. 90 tablet 3  . sodium phosphate (FLEET) 7-19 GM/118ML ENEM Place 133 mLs (1 enema total) rectally 2 (two) times daily as needed for moderate constipation or severe constipation. 6 enema 0  . traMADol (ULTRAM) 50 MG tablet Take 1 tablet (50 mg total) by mouth every 6 (six) hours as needed for pain. 15 tablet 0   No current facility-administered medications for this visit.    Past Medical History  Diagnosis Date  . Anterior chest wall pain   . Cough   . UTI (urinary tract infection)   . HTN (hypertension)   . Hyperlipidemia   . History of diverticulitis of colon   . DM type 2 (diabetes mellitus, type 2)   . Asthma   . Anxiety   . Iron deficiency anemia   . Allergic rhinitis   . IBS (irritable bowel syndrome)   . GERD (gastroesophageal reflux disease)   . Osteoporosis,  unspecified     Past Surgical History  Procedure Laterality Date  . Esophagogastroduodenoscopy  12/26/01  . Cardiovascular stress test  02/25/04  . Right oophorectomy  jan 2010    Family History  Problem Relation Age of Onset  . Atopy Neg Hx   . Asthma Neg Hx   . Lung cancer Brother   . Melanoma Brother     History   Social History  . Marital Status: Widowed    Spouse Name: N/A    Number of Children: N/A  . Years of Education: N/A   Occupational History  . retired    Social History Main Topics  . Smoking status: Former Smoker -- 0.30 packs/day for 5 years    Types: Cigarettes    Quit date: 10/02/1986  . Smokeless tobacco: Never Used  . Alcohol Use: No  . Drug Use: No  . Sexual Activity: Not on file   Other Topics Concern  . Not on file   Social History Narrative    Review of Systems:  All systems  reviewed.  They are negative to the above problem except as previously stated.  Vital Signs: BP 146/84 mmHg  Pulse 58  Ht '5\' 4"'  (1.626 m)  Wt 202 lb 9.6 oz (91.899 kg)  BMI 34.76 kg/m2  Physical Exam  HEENT:  Normocephalic, atraumatic. EOMI, PERRLA.  Neck: JVP is normal.  No bruits.  Lungs: clear to auscultation. No rales no wheezes.  Heart: Irregular rate and rhythm. Normal S1, S2. No S3.   No significant murmurs. PMI not displaced.  Abdomen:  Supple, nontender. Normal bowel sounds. No masses. No hepatomegaly.  Extremities:   Good distal pulses throughout. No lower extremity edema.  Musculoskeletal :moving all extremities.  Neuro:   alert and oriented x3.  CN II-XII grossly intact.  EKG  Afib  58 bpm  RBBB   Assessment and Plan: 1.  Atrial fibrillation  Patinet with a CHADsVASC score of 5  Needs anticoagulaton She has been in afib but does not appear to sense this    I would get and echo as well as TSH and electrolytes Rx for Eliquis 5 bid given Rates appear controlled  2.  HTN  Continue meds    3.  HL  Will need to track lipids  4.  Coronary calcifications  Patinet had on CT scan in past  No active symptoms    5  DM  Followed by Dr Loanne Drilling

## 2014-08-21 NOTE — Patient Instructions (Signed)
Your physician has recommended you make the following change in your medication:  1,) START ELIQUIS 5 MG TWICE DAILY Your physician recommends that you return for lab work in: TODAY (CBC, BMET, TSH) Your physician has requested that you have an echocardiogram. Echocardiography is a painless test that uses sound waves to create images of your heart. It provides your doctor with information about the size and shape of your heart and how well your heart's chambers and valves are working. This procedure takes approximately one hour. There are no restrictions for this procedure.

## 2014-09-02 ENCOUNTER — Other Ambulatory Visit: Payer: Self-pay

## 2014-09-02 ENCOUNTER — Other Ambulatory Visit: Payer: Self-pay | Admitting: Endocrinology

## 2014-09-02 MED ORDER — LOSARTAN POTASSIUM-HCTZ 100-12.5 MG PO TABS: 1.0000 | ORAL_TABLET | Freq: Every day | ORAL | Status: DC

## 2014-09-03 ENCOUNTER — Telehealth: Payer: Self-pay | Admitting: *Deleted

## 2014-09-03 NOTE — Telephone Encounter (Signed)
Form from [facility] requesting approval to stop Eliquis--completed by Dr. Harrington Challenger.  Placed in nurse fax bin in medical records.

## 2014-09-03 NOTE — Telephone Encounter (Signed)
PA request for Eliquis sent via CoverMyMeds.

## 2014-09-08 ENCOUNTER — Telehealth: Payer: Self-pay | Admitting: *Deleted

## 2014-09-08 ENCOUNTER — Other Ambulatory Visit: Payer: Self-pay

## 2014-09-08 DIAGNOSIS — Z79899 Other long term (current) drug therapy: Secondary | ICD-10-CM

## 2014-09-08 NOTE — Telephone Encounter (Signed)
PA on Eliquis approved through 09/03/2015. Patient called but not able to leave a message. Will attempt to call this again this afternoon.

## 2014-09-15 ENCOUNTER — Ambulatory Visit (HOSPITAL_COMMUNITY): Payer: Medicare Other | Attending: Cardiology

## 2014-09-15 DIAGNOSIS — I4891 Unspecified atrial fibrillation: Secondary | ICD-10-CM | POA: Insufficient documentation

## 2014-09-15 LAB — BASIC METABOLIC PANEL
BUN: 21 mg/dL (ref 6–23)
CALCIUM: 9.6 mg/dL (ref 8.4–10.5)
CO2: 25 meq/L (ref 19–32)
Chloride: 109 mEq/L (ref 96–112)
Creatinine, Ser: 1.3 mg/dL — ABNORMAL HIGH (ref 0.4–1.2)
GFR: 53.04 mL/min — AB (ref >60.00)
Glucose, Bld: 86 mg/dL (ref 70–99)
Potassium: 3.7 mEq/L (ref 3.5–5.1)
SODIUM: 139 meq/L (ref 135–145)

## 2014-09-15 LAB — CBC
HCT: 38 % (ref 36.0–46.0)
Hemoglobin: 12.1 g/dL (ref 12.0–15.0)
MCHC: 31.9 g/dL (ref 30.0–36.0)
MCV: 84.3 fl (ref 78.0–100.0)
PLATELETS: 199 10*3/uL (ref 150.0–400.0)
RBC: 4.5 Mil/uL (ref 3.87–5.11)
RDW: 17.5 % — ABNORMAL HIGH (ref 11.5–15.5)
WBC: 8.7 10*3/uL (ref 4.0–10.5)

## 2014-09-15 NOTE — Progress Notes (Signed)
2D Echo completed. 09/15/2014

## 2014-09-16 ENCOUNTER — Other Ambulatory Visit (INDEPENDENT_AMBULATORY_CARE_PROVIDER_SITE_OTHER): Payer: Medicare Other | Admitting: *Deleted

## 2014-09-16 DIAGNOSIS — Z79899 Other long term (current) drug therapy: Secondary | ICD-10-CM

## 2014-10-09 ENCOUNTER — Telehealth: Payer: Self-pay | Admitting: Internal Medicine

## 2014-10-09 NOTE — Telephone Encounter (Signed)
Patient takes eliquis 5 mg bid. Will forward to Dr. Harrington Challenger to review. I have not received a clearance form as of this date.

## 2014-10-09 NOTE — Telephone Encounter (Signed)
New message     Request for surgical clearance:  1. What type of surgery is being performed? colonoscopy  2. When is this surgery scheduled? 10-15-14  3. Are there any medications that need to be held prior to surgery and how long?eliquis.  Dr told pt she will probably need to stop rx 1 wk prior to procedure.  Did his office call us?  4. Name of physician performing surgery? Dr Earlean Shawl  5. What is your office phone and fax number?

## 2014-10-11 NOTE — Telephone Encounter (Signed)
Stop Eliquis.  Want to 48 hours before procedure

## 2014-10-12 NOTE — Telephone Encounter (Signed)
lmtcb

## 2014-10-13 NOTE — Telephone Encounter (Signed)
Informed patient. She was told by Dr. Liliane Channel office to not take her dose yesterday

## 2014-10-14 DIAGNOSIS — Z7901 Long term (current) use of anticoagulants: Secondary | ICD-10-CM | POA: Diagnosis not present

## 2014-10-14 DIAGNOSIS — Z5181 Encounter for therapeutic drug level monitoring: Secondary | ICD-10-CM | POA: Diagnosis not present

## 2014-10-15 DIAGNOSIS — Z09 Encounter for follow-up examination after completed treatment for conditions other than malignant neoplasm: Secondary | ICD-10-CM | POA: Diagnosis not present

## 2014-10-15 DIAGNOSIS — Z86010 Personal history of colonic polyps: Secondary | ICD-10-CM | POA: Diagnosis not present

## 2014-10-15 DIAGNOSIS — K640 First degree hemorrhoids: Secondary | ICD-10-CM | POA: Diagnosis not present

## 2014-10-26 ENCOUNTER — Other Ambulatory Visit: Payer: Self-pay | Admitting: Internal Medicine

## 2014-10-26 ENCOUNTER — Other Ambulatory Visit: Payer: Self-pay | Admitting: Endocrinology

## 2014-11-04 ENCOUNTER — Other Ambulatory Visit: Payer: Self-pay | Admitting: Endocrinology

## 2014-11-10 ENCOUNTER — Encounter: Payer: Self-pay | Admitting: Endocrinology

## 2014-11-30 ENCOUNTER — Other Ambulatory Visit: Payer: Self-pay | Admitting: Endocrinology

## 2014-12-07 DIAGNOSIS — H2513 Age-related nuclear cataract, bilateral: Secondary | ICD-10-CM | POA: Diagnosis not present

## 2014-12-07 DIAGNOSIS — Z794 Long term (current) use of insulin: Secondary | ICD-10-CM | POA: Diagnosis not present

## 2014-12-07 DIAGNOSIS — E119 Type 2 diabetes mellitus without complications: Secondary | ICD-10-CM | POA: Diagnosis not present

## 2014-12-07 DIAGNOSIS — H25013 Cortical age-related cataract, bilateral: Secondary | ICD-10-CM | POA: Diagnosis not present

## 2014-12-07 LAB — HM DIABETES EYE EXAM

## 2014-12-16 DIAGNOSIS — H25012 Cortical age-related cataract, left eye: Secondary | ICD-10-CM | POA: Diagnosis not present

## 2014-12-16 DIAGNOSIS — H2512 Age-related nuclear cataract, left eye: Secondary | ICD-10-CM | POA: Diagnosis not present

## 2014-12-23 DIAGNOSIS — H25012 Cortical age-related cataract, left eye: Secondary | ICD-10-CM | POA: Diagnosis not present

## 2014-12-23 DIAGNOSIS — H21562 Pupillary abnormality, left eye: Secondary | ICD-10-CM | POA: Diagnosis not present

## 2014-12-23 DIAGNOSIS — H2512 Age-related nuclear cataract, left eye: Secondary | ICD-10-CM | POA: Diagnosis not present

## 2014-12-28 DIAGNOSIS — H25011 Cortical age-related cataract, right eye: Secondary | ICD-10-CM | POA: Diagnosis not present

## 2014-12-28 DIAGNOSIS — H2511 Age-related nuclear cataract, right eye: Secondary | ICD-10-CM | POA: Diagnosis not present

## 2014-12-30 DIAGNOSIS — H25011 Cortical age-related cataract, right eye: Secondary | ICD-10-CM | POA: Diagnosis not present

## 2014-12-30 DIAGNOSIS — H21561 Pupillary abnormality, right eye: Secondary | ICD-10-CM | POA: Diagnosis not present

## 2014-12-30 DIAGNOSIS — H2511 Age-related nuclear cataract, right eye: Secondary | ICD-10-CM | POA: Diagnosis not present

## 2015-01-20 ENCOUNTER — Ambulatory Visit (INDEPENDENT_AMBULATORY_CARE_PROVIDER_SITE_OTHER): Payer: Medicare Other | Admitting: Endocrinology

## 2015-01-20 ENCOUNTER — Encounter: Payer: Self-pay | Admitting: Endocrinology

## 2015-01-20 VITALS — BP 140/60 | HR 67 | Temp 98.6°F | Ht 64.0 in | Wt 201.0 lb

## 2015-01-20 DIAGNOSIS — E1022 Type 1 diabetes mellitus with diabetic chronic kidney disease: Secondary | ICD-10-CM | POA: Diagnosis not present

## 2015-01-20 DIAGNOSIS — R05 Cough: Secondary | ICD-10-CM | POA: Diagnosis not present

## 2015-01-20 DIAGNOSIS — N189 Chronic kidney disease, unspecified: Secondary | ICD-10-CM

## 2015-01-20 DIAGNOSIS — R059 Cough, unspecified: Secondary | ICD-10-CM | POA: Insufficient documentation

## 2015-01-20 LAB — HEMOGLOBIN A1C: Hgb A1c MFr Bld: 5.2 % (ref 4.6–6.5)

## 2015-01-20 MED ORDER — INSULIN LISPRO 100 UNIT/ML ~~LOC~~ SOLN: SUBCUTANEOUS | Status: DC

## 2015-01-20 MED ORDER — BENZONATATE 100 MG PO CAPS: 100.0000 mg | ORAL_CAPSULE | Freq: Three times a day (TID) | ORAL | Status: DC | PRN

## 2015-01-20 NOTE — Progress Notes (Signed)
Subjective:    Patient ID: [name], female    DOB: [birth year], 79 y.o.   MRN: [id number]  HPI Pt returns for f/u of diabetes mellitus: DM type: Insulin-requiring type 2 Dx'ed: 2620 Complications: renal insuff Therapy: insulin since 2008 GDM: never DKA: never Severe hypoglycemia: never Pancreatitis: never Other: she takes multiple daily injections. Interval history: no cbg record, but states cbg's are highest at hs (high-100's).  It is lowest in the middle of the night (80's).  She denies hypoglycemia. Pt states 3 weeks of moderate prod-quality cough in the chest, and assoc nasal congestion Past Medical History  Diagnosis Date  . Anterior chest wall pain   . Cough   . UTI (urinary tract infection)   . HTN (hypertension)   . Hyperlipidemia   . History of diverticulitis of colon   . DM type 2 (diabetes mellitus, type 2)   . Asthma   . Anxiety   . Iron deficiency anemia   . Allergic rhinitis   . IBS (irritable bowel syndrome)   . GERD (gastroesophageal reflux disease)   . Osteoporosis, unspecified     Past Surgical History  Procedure Laterality Date  . Esophagogastroduodenoscopy  12/26/01  . Cardiovascular stress test  02/25/04  . Right oophorectomy  jan 2010    History   Social History  . Marital Status: Widowed    Spouse Name: N/A  . Number of Children: N/A  . Years of Education: N/A   Occupational History  . retired    Social History Main Topics  . Smoking status: Former Smoker -- 0.30 packs/day for 5 years    Types: Cigarettes    Quit date: 10/02/1986  . Smokeless tobacco: Never Used  . Alcohol Use: No  . Drug Use: No  . Sexual Activity: Not on file   Other Topics Concern  . Not on file   Social History Narrative    Current Outpatient Prescriptions on File Prior to Visit  Medication Sig Dispense Refill  . acetaminophen (TYLENOL) 325 MG tablet Take 650 mg by mouth every 6 (six) hours as needed for pain.    Marland Kitchen amLODipine (NORVASC) 10 MG tablet  TAKE 0.5 TABLETS (5 MG TOTAL) BY MOUTH DAILY. 30 tablet 3  . apixaban (ELIQUIS) 5 MG TABS tablet Take 1 tablet (5 mg total) by mouth 2 (two) times daily. 60 tablet 11  . Blood Glucose Monitoring Suppl (ONE TOUCH ULTRA 2) W/DEVICE KIT Check blood sugar 3 times per day. 1 each 0  . budesonide-formoterol (SYMBICORT) 160-4.5 MCG/ACT inhaler Inhale 2 puffs into the lungs 2 (two) times daily. 1 Inhaler 11  . clotrimazole (MYCELEX) 10 MG troche Take 1 tablet (10 mg total) by mouth 5 (five) times daily. 35 tablet 1  . glucose blood (ONE TOUCH ULTRA TEST) test strip Use as instructed three times daily     . HYDROcodone-acetaminophen (NORCO) 10-325 MG per tablet Take 1 tablet by mouth every 6 (six) hours as needed. 30 tablet 0  . Insulin Syringe-Needle U-100 (BD INSULIN SYRINGE ULTRAFINE) 31G X 5/16" 0.3 ML MISC Use as directed     . losartan-hydrochlorothiazide (HYZAAR) 100-12.5 MG per tablet Take 1 tablet by mouth daily. 30 tablet 2  . montelukast (SINGULAIR) 10 MG tablet TAKE 1 TABLET (10 MG TOTAL) BY MOUTH AT BEDTIME. 90 tablet 0  . ondansetron (ZOFRAN-ODT) 4 MG disintegrating tablet Take 1 tablet (4 mg total) by mouth every 8 (eight) hours as needed for nausea or vomiting. 20 tablet 0  .  ONE TOUCH LANCETS MISC Use as directed three times daily     . pravastatin (PRAVACHOL) 40 MG tablet TAKE 1 TABLET (40 MG TOTAL) BY MOUTH DAILY. 90 tablet 3  . sodium phosphate (FLEET) 7-19 GM/118ML ENEM Place 133 mLs (1 enema total) rectally 2 (two) times daily as needed for moderate constipation or severe constipation. 6 enema 0  . traMADol (ULTRAM) 50 MG tablet Take 1 tablet (50 mg total) by mouth every 6 (six) hours as needed for pain. 15 tablet 0  . albuterol (PROAIR HFA) 108 (90 BASE) MCG/ACT inhaler Inhale 2 puffs into the lungs every 4 (four) hours as needed for wheezing. 1 Inhaler 1   No current facility-administered medications on file prior to visit.    Allergies  Allergen Reactions  . Aspirin   .  Ramipril     REACTION: Cough    Family History  Problem Relation Age of Onset  . Atopy Neg Hx   . Asthma Neg Hx   . Lung cancer Brother   . Melanoma Brother     BP 140/60 mmHg  Pulse 67  Temp(Src) 98.6 F (37 C) (Oral)  Ht '5\' 4"'  (1.626 m)  Wt 201 lb (91.173 kg)  BMI 34.48 kg/m2  SpO2 98%   Review of Systems She has slight wheezing in the chest, but no earache.     Objective:   Physical Exam VITAL SIGNS:  See vs page GENERAL: no distress head: no deformity eyes: no periorbital swelling, no proptosis external nose and ears are normal mouth: no lesion seen Both eac's and tm's are normal LUNGS:  Clear to auscultation, except for a few diffuse rhonchi   Lab Results  Component Value Date   HGBA1C 5.2 01/20/2015       Assessment & Plan:  Allergic rhinitis, worse DM: overcontrolled.   Patient is advised the following: Patient Instructions  blood tests and a chest x-ray are requested for you today.  We'll let you know about the results. Please come back for a follow-up appointment in 3 months. i have sent a prescription to your pharmacy, for cough medicine. Please continue your inhalers.  Loratadine-d (non-prescription) will help your congestion.  Please see Dr Gustavus Bryant office if you don't feel better soon.

## 2015-01-20 NOTE — Patient Instructions (Addendum)
blood tests and a chest x-ray are requested for you today.  We'll let you know about the results. Please come back for a follow-up appointment in 3 months. i have sent a prescription to your pharmacy, for cough medicine. Please continue your inhalers.  Loratadine-d (non-prescription) will help your congestion.  Please see Dr Gustavus Bryant office if you don't feel better soon.

## 2015-01-28 ENCOUNTER — Telehealth: Payer: Self-pay | Admitting: Endocrinology

## 2015-01-28 NOTE — Telephone Encounter (Signed)
Contacted patient advised of note below. Pt voiced understanding.

## 2015-01-28 NOTE — Telephone Encounter (Signed)
Ok, please d/c benzonatate. i'll see you tomorrow.

## 2015-01-28 NOTE — Telephone Encounter (Signed)
Patient called stating that Dr. Loanne Drilling prescribed her medication and she feels it is giving her a rash   + Rash on arms and legs + Itching  - SOB   She has a scheduled appointment for tomorrow to see Dr. Loanne Drilling   Please advise patient   Thank you

## 2015-01-28 NOTE — Telephone Encounter (Signed)
See note below and please advise, Thanks! 

## 2015-01-29 ENCOUNTER — Ambulatory Visit (INDEPENDENT_AMBULATORY_CARE_PROVIDER_SITE_OTHER): Payer: Medicare Other | Admitting: Endocrinology

## 2015-01-29 ENCOUNTER — Encounter: Payer: Self-pay | Admitting: Endocrinology

## 2015-01-29 VITALS — BP 138/80 | HR 79 | Temp 98.7°F | Ht 64.0 in | Wt 200.0 lb

## 2015-01-29 DIAGNOSIS — R21 Rash and other nonspecific skin eruption: Secondary | ICD-10-CM

## 2015-01-29 DIAGNOSIS — Z0000 Encounter for general adult medical examination without abnormal findings: Secondary | ICD-10-CM

## 2015-01-29 MED ORDER — TRIAMCINOLONE ACETONIDE 0.1 % EX CREA: 1.0000 "application " | TOPICAL_CREAM | Freq: Two times a day (BID) | CUTANEOUS | Status: DC

## 2015-01-29 NOTE — Progress Notes (Signed)
we discussed code status.  pt requests full code, but would not want to be started or maintained on artificial life-support measures if there was not a reasonable chance of recovery 

## 2015-01-29 NOTE — Patient Instructions (Addendum)
i have sent a prescription to your pharmacy, for a skin cream. Please stop the benzonatate for now, although this is unlikely to be the cause. good diet and exercise significantly improve the control of your diabetes.  please let me know if you wish to be referred to a dietician.  high blood sugar is very risky to your health.  you should see an eye doctor and dentist every year.  It is very important to get all recommended vaccinations.  please consider these measures for your health:  minimize alcohol.  do not use tobacco products.  have a colonoscopy at least every 10 years from age 31.  Women should have an annual mammogram from age 60.  keep firearms safely stored.  always use seat belts.  have working smoke alarms in your home.  see an eye doctor and dentist regularly.  never drive under the influence of alcohol or drugs (including prescription drugs).   it is critically important to prevent falling down (keep floor areas well-lit, dry, and free of loose objects.  If you have a cane, walker, or wheelchair, you should use it, even for short trips around the house.  Also, try not to rush).   Please come back for a follow-up appointment in 3 months.

## 2015-01-29 NOTE — Progress Notes (Signed)
Subjective:    Patient ID: [name], female    DOB: 11/19/1935, 79 y.o.   MRN: [id number]  HPI Pt states few days of moderate rash throughout the body, and assoc itching.  She is unable to cite precip factor, except for tessalon she takes for cough.  She stopped this 1-2 days ago.   Past Medical History  Diagnosis Date  . Anterior chest wall pain   . Cough   . UTI (urinary tract infection)   . HTN (hypertension)   . Hyperlipidemia   . History of diverticulitis of colon   . DM type 2 (diabetes mellitus, type 2)   . Asthma   . Anxiety   . Iron deficiency anemia   . Allergic rhinitis   . IBS (irritable bowel syndrome)   . GERD (gastroesophageal reflux disease)   . Osteoporosis, unspecified     Past Surgical History  Procedure Laterality Date  . Esophagogastroduodenoscopy  12/26/01  . Cardiovascular stress test  02/25/04  . Right oophorectomy  jan 2010    History   Social History  . Marital Status: Widowed    Spouse Name: N/A  . Number of Children: N/A  . Years of Education: N/A   Occupational History  . retired    Social History Main Topics  . Smoking status: Former Smoker -- 0.30 packs/day for 5 years    Types: Cigarettes    Quit date: 10/02/1986  . Smokeless tobacco: Never Used  . Alcohol Use: No  . Drug Use: No  . Sexual Activity: Not on file   Other Topics Concern  . Not on file   Social History Narrative    Current Outpatient Prescriptions on File Prior to Visit  Medication Sig Dispense Refill  . acetaminophen (TYLENOL) 325 MG tablet Take 650 mg by mouth every 6 (six) hours as needed for pain.    Marland Kitchen amLODipine (NORVASC) 10 MG tablet TAKE 0.5 TABLETS (5 MG TOTAL) BY MOUTH DAILY. 30 tablet 3  . apixaban (ELIQUIS) 5 MG TABS tablet Take 1 tablet (5 mg total) by mouth 2 (two) times daily. 60 tablet 11  . Blood Glucose Monitoring Suppl (ONE TOUCH ULTRA 2) W/DEVICE KIT Check blood sugar 3 times per day. 1 each 0  . budesonide-formoterol (SYMBICORT) 160-4.5  MCG/ACT inhaler Inhale 2 puffs into the lungs 2 (two) times daily. 1 Inhaler 11  . clotrimazole (MYCELEX) 10 MG troche Take 1 tablet (10 mg total) by mouth 5 (five) times daily. 35 tablet 1  . glucose blood (ONE TOUCH ULTRA TEST) test strip Use as instructed three times daily     . HYDROcodone-acetaminophen (NORCO) 10-325 MG per tablet Take 1 tablet by mouth every 6 (six) hours as needed. 30 tablet 0  . insulin lispro (HUMALOG) 100 UNIT/ML injection 3 times a day (just before each meal) 15-10-25 units, and syringes 3/day 20 mL 11  . Insulin Syringe-Needle U-100 (BD INSULIN SYRINGE ULTRAFINE) 31G X 5/16" 0.3 ML MISC Use as directed     . losartan-hydrochlorothiazide (HYZAAR) 100-12.5 MG per tablet Take 1 tablet by mouth daily. 30 tablet 2  . montelukast (SINGULAIR) 10 MG tablet TAKE 1 TABLET (10 MG TOTAL) BY MOUTH AT BEDTIME. 90 tablet 0  . ondansetron (ZOFRAN-ODT) 4 MG disintegrating tablet Take 1 tablet (4 mg total) by mouth every 8 (eight) hours as needed for nausea or vomiting. 20 tablet 0  . ONE TOUCH LANCETS MISC Use as directed three times daily     . pravastatin (  PRAVACHOL) 40 MG tablet TAKE 1 TABLET (40 MG TOTAL) BY MOUTH DAILY. 90 tablet 3  . sodium phosphate (FLEET) 7-19 GM/118ML ENEM Place 133 mLs (1 enema total) rectally 2 (two) times daily as needed for moderate constipation or severe constipation. 6 enema 0  . traMADol (ULTRAM) 50 MG tablet Take 1 tablet (50 mg total) by mouth every 6 (six) hours as needed for pain. 15 tablet 0  . albuterol (PROAIR HFA) 108 (90 BASE) MCG/ACT inhaler Inhale 2 puffs into the lungs every 4 (four) hours as needed for wheezing. 1 Inhaler 1   No current facility-administered medications on file prior to visit.    Allergies  Allergen Reactions  . Aspirin   . Ramipril     REACTION: Cough    Family History  Problem Relation Age of Onset  . Atopy Neg Hx   . Asthma Neg Hx   . Lung cancer Brother   . Melanoma Brother     BP 138/80 mmHg  Pulse 79   Temp(Src) 98.7 F (37.1 C) (Oral)  Ht 5' 4" (1.626 m)  Wt 200 lb (90.719 kg)  BMI 34.31 kg/m2  SpO2 97%    Review of Systems Denies fever and sob    Objective:   Physical Exam VITAL SIGNS:  See vs page GENERAL: no distress Skin: mild diffuse polypapular rash.  No urticaria.         Assessment & Plan:  Rash, new, uncertain etiology i have sent a prescription to your pharmacy, for a skin cream. Please stop the benzonatate for now, although this is unlikely to be the cause.   Subjective:   Patient here for Medicare annual wellness visit and management of other chronic and acute problems.     Risk factors: advanced age    31 of Physicians Providing Medical Care to Patient:  See "snapshot"   Activities of Daily Living: In your present state of health, do you have any difficulty performing the following activities?:  Preparing food and eating?: No  Bathing yourself: No  Getting dressed: No  Using the toilet:No  Moving around from place to place: No  In the past year have you fallen or had a near fall?: No    Home Safety: Has smoke detector and wears seat belts. No firearms.  Diet and Exercise  Current exercise habits: pt says not good. Dietary issues discussed: pt reports a healthy diet   Depression Screen  Q1: Over the past two weeks, have you felt down, depressed or hopeless?no  Q2: Over the past two weeks, have you felt little interest or pleasure in doing things? no   The following portions of the patient's history were reviewed and updated as appropriate: allergies, current medications, past family history, past medical history, past social history, past surgical history and problem list.   Review of Systems  Denies hearing loss, and visual loss Objective:   Vision:  Sees opthalmologist Hearing: grossly normal Body mass index:  See vs page Msk: pt easily and quickly performs "get-up-and-go" from a sitting position.  Cognitive Impairment Assessment:  cognition, memory and judgment appear normal.  remembers 3/3 at 5 minutes.  excellent recall.  can easily read and write a sentence.  alert and oriented x 3   Assessment:   Medicare wellness utd on preventive parameters    Plan:   During the course of the visit the patient was educated and counseled about appropriate screening and preventive services including:  Fall prevention   Screening mammography  Bone densitometry screening  Diabetes screening  Nutrition counseling   Vaccines / LABS Zostavax / Pneumococcal Vaccine  today   Patient Instructions (the written plan) was given to the patient.

## 2015-02-04 ENCOUNTER — Other Ambulatory Visit: Payer: Self-pay | Admitting: Endocrinology

## 2015-03-13 ENCOUNTER — Other Ambulatory Visit: Payer: Self-pay | Admitting: Endocrinology

## 2015-03-23 ENCOUNTER — Other Ambulatory Visit: Payer: Self-pay | Admitting: Endocrinology

## 2015-04-09 DIAGNOSIS — L579 Skin changes due to chronic exposure to nonionizing radiation, unspecified: Secondary | ICD-10-CM | POA: Diagnosis not present

## 2015-04-20 ENCOUNTER — Other Ambulatory Visit: Payer: Self-pay | Admitting: Endocrinology

## 2015-05-03 DIAGNOSIS — Z961 Presence of intraocular lens: Secondary | ICD-10-CM | POA: Diagnosis not present

## 2015-05-11 ENCOUNTER — Encounter: Payer: Self-pay | Admitting: Endocrinology

## 2015-05-11 ENCOUNTER — Ambulatory Visit (INDEPENDENT_AMBULATORY_CARE_PROVIDER_SITE_OTHER): Payer: Medicare Other | Admitting: Endocrinology

## 2015-05-11 VITALS — BP 139/82 | HR 64 | Temp 98.5°F | Ht 64.0 in | Wt 203.0 lb

## 2015-05-11 DIAGNOSIS — N189 Chronic kidney disease, unspecified: Secondary | ICD-10-CM | POA: Diagnosis not present

## 2015-05-11 DIAGNOSIS — E1022 Type 1 diabetes mellitus with diabetic chronic kidney disease: Secondary | ICD-10-CM | POA: Diagnosis not present

## 2015-05-11 LAB — POCT GLYCOSYLATED HEMOGLOBIN (HGB A1C): Hemoglobin A1C: 6.1

## 2015-05-11 MED ORDER — INSULIN LISPRO 100 UNIT/ML ~~LOC~~ SOLN: SUBCUTANEOUS | Status: DC

## 2015-05-11 MED ORDER — TRAMADOL HCL 50 MG PO TABS: 50.0000 mg | ORAL_TABLET | Freq: Four times a day (QID) | ORAL | Status: DC | PRN

## 2015-05-11 NOTE — Progress Notes (Signed)
Subjective:    Patient ID: [name], female    DOB: November 10, 1935, 79 y.o.   MRN: [id number]  HPI Pt returns for f/u of diabetes mellitus: DM type: Insulin-requiring type 2 Dx'ed: 6256 Complications: renal insuff Therapy: insulin since 2008 GDM: never DKA: never Severe hypoglycemia: never Pancreatitis: never Other: she takes multiple daily injections.   Interval history: no cbg record, but states cbg's are lowest at hs.  She seldom has hypoglycemia, and these episodes are mild.   Past Medical History  Diagnosis Date  . Anterior chest wall pain   . Cough   . UTI (urinary tract infection)   . HTN (hypertension)   . Hyperlipidemia   . History of diverticulitis of colon   . DM type 2 (diabetes mellitus, type 2)   . Asthma   . Anxiety   . Iron deficiency anemia   . Allergic rhinitis   . IBS (irritable bowel syndrome)   . GERD (gastroesophageal reflux disease)   . Osteoporosis, unspecified     Past Surgical History  Procedure Laterality Date  . Esophagogastroduodenoscopy  12/26/01  . Cardiovascular stress test  02/25/04  . Right oophorectomy  jan 2010    Social History   Social History  . Marital Status: Widowed    Spouse Name: N/A  . Number of Children: N/A  . Years of Education: N/A   Occupational History  . retired    Social History Main Topics  . Smoking status: Former Smoker -- 0.30 packs/day for 5 years    Types: Cigarettes    Quit date: 10/02/1986  . Smokeless tobacco: Never Used  . Alcohol Use: No  . Drug Use: No  . Sexual Activity: Not on file   Other Topics Concern  . Not on file   Social History Narrative    Current Outpatient Prescriptions on File Prior to Visit  Medication Sig Dispense Refill  . acetaminophen (TYLENOL) 325 MG tablet Take 650 mg by mouth every 6 (six) hours as needed for pain.    Marland Kitchen amLODipine (NORVASC) 10 MG tablet TAKE 0.5 TABLETS (5 MG TOTAL) BY MOUTH DAILY. 30 tablet 3  . apixaban (ELIQUIS) 5 MG TABS tablet Take 1 tablet  (5 mg total) by mouth 2 (two) times daily. 60 tablet 11  . Blood Glucose Monitoring Suppl (ONE TOUCH ULTRA 2) W/DEVICE KIT Check blood sugar 3 times per day. 1 each 0  . clotrimazole (MYCELEX) 10 MG troche Take 1 tablet (10 mg total) by mouth 5 (five) times daily. 35 tablet 1  . glucose blood (ONE TOUCH ULTRA TEST) test strip Use as instructed three times daily     . Insulin Syringe-Needle U-100 (BD INSULIN SYRINGE ULTRAFINE) 31G X 5/16" 0.3 ML MISC Use as directed     . losartan-hydrochlorothiazide (HYZAAR) 100-12.5 MG per tablet Take 1 tablet by mouth daily. 30 tablet 2  . montelukast (SINGULAIR) 10 MG tablet TAKE 1 TABLET (10 MG TOTAL) BY MOUTH AT BEDTIME. 90 tablet 0  . ondansetron (ZOFRAN-ODT) 4 MG disintegrating tablet Take 1 tablet (4 mg total) by mouth every 8 (eight) hours as needed for nausea or vomiting. 20 tablet 0  . ONE TOUCH LANCETS MISC Use as directed three times daily     . pravastatin (PRAVACHOL) 40 MG tablet TAKE 1 TABLET (40 MG TOTAL) BY MOUTH DAILY. 90 tablet 3  . sodium phosphate (FLEET) 7-19 GM/118ML ENEM Place 133 mLs (1 enema total) rectally 2 (two) times daily as needed for moderate constipation  or severe constipation. 6 enema 0  . SYMBICORT 160-4.5 MCG/ACT inhaler INHALE 2 PUFFS TWICE DAILY 10.2 Inhaler 8  . triamcinolone cream (KENALOG) 0.1 % Apply 1 application topically 2 (two) times daily. 30 g 0  . albuterol (PROAIR HFA) 108 (90 BASE) MCG/ACT inhaler Inhale 2 puffs into the lungs every 4 (four) hours as needed for wheezing. 1 Inhaler 1   No current facility-administered medications on file prior to visit.    Allergies  Allergen Reactions  . Aspirin   . Ramipril     REACTION: Cough    Family History  Problem Relation Age of Onset  . Atopy Neg Hx   . Asthma Neg Hx   . Lung cancer Brother   . Melanoma Brother     BP 139/82 mmHg  Pulse 64  Temp(Src) 98.5 F (36.9 C) (Oral)  Ht '5\' 4"'  (1.626 m)  Wt 203 lb (92.08 kg)  BMI 34.83 kg/m2  SpO2  95%  Review of Systems She has diffuse arthralgias, worst at the back of the neck.  Denies LOC.      Objective:   Physical Exam VITAL SIGNS:  See vs page GENERAL: no distress Neck: full ROM, but ROM is painful.   Pulses: dorsalis pedis intact bilat.   MSK: no deformity of the feet CV: no leg edema Skin:  no ulcer on the feet.  normal color and temp on the feet. Neuro: sensation is intact to touch on the feet.  Ext: there is bilateral onychomycosis of the toenails.    A1c=6.1%    Assessment & Plan:  DM: well-controlled.   Chronic arthralgias, worse.    Patient is advised the following: Patient Instructions  Please come back for a follow-up appointment in 3 months.   Please reduce the insulin to 3 times a day (just before each meal) 07-06-14 units.  check your blood sugar twice a day.  vary the time of day when you check, between before the 3 meals, and at bedtime.  also check if you have symptoms of your blood sugar being too high or too low.  please keep a record of the readings and bring it to your next appointment here.  You can write it on any piece of paper.  please call us sooner if your blood sugar goes below 70, or if you have a lot of readings over 200. Please resume the tramadol, as needed for pain.  Here is a prescription.

## 2015-05-11 NOTE — Patient Instructions (Addendum)
Please come back for a follow-up appointment in 3 months.   Please reduce the insulin to 3 times a day (just before each meal) 07-06-14 units.  check your blood sugar twice a day.  vary the time of day when you check, between before the 3 meals, and at bedtime.  also check if you have symptoms of your blood sugar being too high or too low.  please keep a record of the readings and bring it to your next appointment here.  You can write it on any piece of paper.  please call us sooner if your blood sugar goes below 70, or if you have a lot of readings over 200. Please resume the tramadol, as needed for pain.  Here is a prescription.

## 2015-06-30 ENCOUNTER — Other Ambulatory Visit: Payer: Self-pay | Admitting: Endocrinology

## 2015-08-03 ENCOUNTER — Other Ambulatory Visit: Payer: Self-pay | Admitting: Endocrinology

## 2015-08-10 ENCOUNTER — Other Ambulatory Visit: Payer: Self-pay | Admitting: Endocrinology

## 2015-08-25 ENCOUNTER — Other Ambulatory Visit: Payer: Self-pay | Admitting: Internal Medicine

## 2015-09-03 ENCOUNTER — Other Ambulatory Visit: Payer: Self-pay | Admitting: Endocrinology

## 2015-09-22 ENCOUNTER — Telehealth: Payer: Self-pay

## 2015-09-22 ENCOUNTER — Other Ambulatory Visit: Payer: Self-pay | Admitting: Internal Medicine

## 2015-09-22 NOTE — Telephone Encounter (Signed)
Pt called in about getting refill for Eliquis. Patient stated that her pharmacy [facility] told her there was a problem with the insurance. I explained to patient that she would need to get [facility] to fax something to [place] or call us in order for [place] to help her. She stated she would call [facility] and let them know and that she is not out of Eliquis yet. She still has a couple days worth of pills.

## 2015-09-22 NOTE — Telephone Encounter (Signed)
Prior auth for Eliquis 5 mg sent to [facility]. 

## 2015-09-23 ENCOUNTER — Other Ambulatory Visit: Payer: Self-pay | Admitting: Internal Medicine

## 2015-09-23 ENCOUNTER — Other Ambulatory Visit: Payer: Self-pay | Admitting: Endocrinology

## 2015-09-24 ENCOUNTER — Telehealth: Payer: Self-pay

## 2015-09-24 NOTE — Telephone Encounter (Signed)
Eliquis approved through 09/21/2016. PA # [id number].

## 2015-09-30 ENCOUNTER — Other Ambulatory Visit: Payer: Self-pay | Admitting: Internal Medicine

## 2015-10-01 ENCOUNTER — Telehealth: Payer: Self-pay | Admitting: Internal Medicine

## 2015-10-01 MED ORDER — ALBUTEROL SULFATE HFA 108 (90 BASE) MCG/ACT IN AERS: 2.0000 | INHALATION_SPRAY | RESPIRATORY_TRACT | Status: DC | PRN

## 2015-10-01 NOTE — Telephone Encounter (Signed)
Called spoke with pt. Aware we do not have any samples. She has pending appt 10/07/15 w/ MW. Refill sent in to get to her appt. Nothing further needed

## 2015-10-07 ENCOUNTER — Ambulatory Visit: Payer: Medicare Other | Admitting: Internal Medicine

## 2015-10-13 ENCOUNTER — Ambulatory Visit
Admission: RE | Admit: 2015-10-13 | Discharge: 2015-10-13 | Disposition: A | Discharge status: Home / Self Care | Payer: Medicare Other | Source: Ambulatory Visit | Attending: Endocrinology | Admitting: Endocrinology

## 2015-10-13 ENCOUNTER — Ambulatory Visit (INDEPENDENT_AMBULATORY_CARE_PROVIDER_SITE_OTHER): Payer: Medicare Other | Admitting: Endocrinology

## 2015-10-13 ENCOUNTER — Encounter: Payer: Self-pay | Admitting: Endocrinology

## 2015-10-13 VITALS — BP 138/80 | HR 81 | Temp 99.4°F | Ht 64.0 in | Wt 199.0 lb

## 2015-10-13 DIAGNOSIS — R0602 Shortness of breath: Secondary | ICD-10-CM | POA: Diagnosis not present

## 2015-10-13 DIAGNOSIS — R059 Cough, unspecified: Secondary | ICD-10-CM

## 2015-10-13 DIAGNOSIS — R05 Cough: Secondary | ICD-10-CM | POA: Diagnosis not present

## 2015-10-13 DIAGNOSIS — E1022 Type 1 diabetes mellitus with diabetic chronic kidney disease: Secondary | ICD-10-CM | POA: Diagnosis not present

## 2015-10-13 DIAGNOSIS — N189 Chronic kidney disease, unspecified: Secondary | ICD-10-CM

## 2015-10-13 LAB — POCT GLYCOSYLATED HEMOGLOBIN (HGB A1C): Hemoglobin A1C: 5.8

## 2015-10-13 MED ORDER — INSULIN LISPRO 100 UNIT/ML ~~LOC~~ SOLN: SUBCUTANEOUS | Status: DC

## 2015-10-13 MED ORDER — PROMETHAZINE-CODEINE 6.25-10 MG/5ML PO SYRP: 5.0000 mL | ORAL_SOLUTION | ORAL | Status: DC | PRN

## 2015-10-13 MED ORDER — AZITHROMYCIN 500 MG PO TABS
500.0000 mg | ORAL_TABLET | Freq: Every day | ORAL | Status: DC
Start: 2015-10-13 — End: 2016-04-12

## 2015-10-13 NOTE — Progress Notes (Signed)
Subjective:    Patient ID: [name], female    DOB: 08/22/1936, 80 y.o.   MRN: [id number]  HPI  Pt returns for f/u of diabetes mellitus: DM type: Insulin-requiring type 2 Dx'ed: 9937 Complications: renal insuff Therapy: insulin since 2008 GDM: never DKA: never Severe hypoglycemia: never Pancreatitis: never Other: she takes multiple daily injections.   Interval history: She has mild hypoglycemia at night. Pt states 1 week of moderate dry-quality cough in the chest, and assoc pain.   Past Medical History  Diagnosis Date  . Anterior chest wall pain   . Cough   . UTI (urinary tract infection)   . HTN (hypertension)   . Hyperlipidemia   . History of diverticulitis of colon   . DM type 2 (diabetes mellitus, type 2) ([place])   . Asthma   . Anxiety   . Iron deficiency anemia   . Allergic rhinitis   . IBS (irritable bowel syndrome)   . GERD (gastroesophageal reflux disease)   . Osteoporosis, unspecified     Past Surgical History  Procedure Laterality Date  . Esophagogastroduodenoscopy  12/26/01  . Cardiovascular stress test  02/25/04  . Right oophorectomy  jan 2010    Social History   Social History  . Marital Status: Widowed    Spouse Name: N/A  . Number of Children: N/A  . Years of Education: N/A   Occupational History  . retired    Social History Main Topics  . Smoking status: Former Smoker -- 0.30 packs/day for 5 years    Types: Cigarettes    Quit date: 10/02/1986  . Smokeless tobacco: Never Used  . Alcohol Use: No  . Drug Use: No  . Sexual Activity: Not on file   Other Topics Concern  . Not on file   Social History Narrative    Current Outpatient Prescriptions on File Prior to Visit  Medication Sig Dispense Refill  . acetaminophen (TYLENOL) 325 MG tablet Take 650 mg by mouth every 6 (six) hours as needed for pain.    Marland Kitchen albuterol (PROAIR HFA) 108 (90 Base) MCG/ACT inhaler Inhale 2 puffs into the lungs every 4 (four) hours as needed for wheezing. 1  Inhaler 0  . amLODipine (NORVASC) 10 MG tablet TAKE 0.5 TABLETS (5 MG TOTAL) BY MOUTH DAILY. 30 tablet 3  . amLODipine (NORVASC) 10 MG tablet TAKE 0.5 TABLETS (5 MG TOTAL) BY MOUTH DAILY. 30 tablet 1  . Blood Glucose Monitoring Suppl (ONE TOUCH ULTRA 2) W/DEVICE KIT Check blood sugar 3 times per day. 1 each 0  . clotrimazole (MYCELEX) 10 MG troche Take 1 tablet (10 mg total) by mouth 5 (five) times daily. 35 tablet 1  . ELIQUIS 5 MG TABS tablet TAKE 1 TABLET (5 MG TOTAL) BY MOUTH 2 (TWO) TIMES DAILY. 60 tablet 9  . glucose blood (ONE TOUCH ULTRA TEST) test strip Use as instructed three times daily     . Insulin Syringe-Needle U-100 (BD INSULIN SYRINGE ULTRAFINE) 31G X 5/16" 0.3 ML MISC Use as directed     . losartan-hydrochlorothiazide (HYZAAR) 100-12.5 MG per tablet Take 1 tablet by mouth daily. 30 tablet 2  . losartan-hydrochlorothiazide (HYZAAR) 100-12.5 MG tablet TAKE 1 TABLET EVERY DAY 30 tablet 2  . montelukast (SINGULAIR) 10 MG tablet TAKE 1 TABLET BY MOUTH AT BEDTIME 90 tablet 0  . ondansetron (ZOFRAN-ODT) 4 MG disintegrating tablet Take 1 tablet (4 mg total) by mouth every 8 (eight) hours as needed for nausea or vomiting. 20 tablet  0  . ONE TOUCH LANCETS MISC Use as directed three times daily     . pravastatin (PRAVACHOL) 40 MG tablet TAKE 1 TABLET BY MOUTH DAILY 90 tablet 3  . sodium phosphate (FLEET) 7-19 GM/118ML ENEM Place 133 mLs (1 enema total) rectally 2 (two) times daily as needed for moderate constipation or severe constipation. 6 enema 0  . SYMBICORT 160-4.5 MCG/ACT inhaler INHALE 2 PUFFS TWICE DAILY 10.2 Inhaler 8  . traMADol (ULTRAM) 50 MG tablet Take 1 tablet (50 mg total) by mouth every 6 (six) hours as needed. 50 tablet 3  . triamcinolone cream (KENALOG) 0.1 % Apply 1 application topically 2 (two) times daily. 30 g 0   No current facility-administered medications on file prior to visit.    Allergies  Allergen Reactions  . Aspirin   . Ramipril     REACTION: Cough     Family History  Problem Relation Age of Onset  . Atopy Neg Hx   . Asthma Neg Hx   . Lung cancer Brother   . Melanoma Brother     BP 138/80 mmHg  Pulse 81  Temp(Src) 99.4 F (37.4 C) (Oral)  Ht '5\' 4"'  (1.626 m)  Wt 199 lb (90.266 kg)  BMI 34.14 kg/m2  SpO2 95%  Review of Systems She has sore throat, wheezing, and nasal congestion, but no fever.      Objective:   Physical Exam VITAL SIGNS:  See vs page. GENERAL: no distress. head: no deformity.  eyes: no periorbital swelling, no proptosis external nose and ears are normal mouth: no lesion seen.  Both eac's and tm's are normal LUNGS:  Clear to auscultation.    A1c=5.8%    Assessment & Plan:  URI: new DM: overcontrolled.   Patient is advised the following: Patient Instructions  i have sent a prescription to your pharmacy, for an antibiotic.   Loratadine-d (non-prescription) will help your congestion.  Here is a prescription for cough syrup.  Please continue the same symbicort. A chest x-ray is requested for you today.  We'll let you know about the results. Please reduce the insulin to 3 times a day (just before each meal) 07-06-09 units. I hope you feel better soon.  If you don't feel better by next week, please call back.  Please call sooner if you get worse. check your blood sugar twice a day.  vary the time of day when you check, between before the 3 meals, and at bedtime.  also check if you have symptoms of your blood sugar being too high or too low.  please keep a record of the readings and bring it to your next appointment here (or you can bring the meter itself).  You can write it on any piece of paper.  please call us sooner if your blood sugar goes below 70, or if you have a lot of readings over 200. Please come back for a regular physical appointment in 4 months (must be after 01/29/16).

## 2015-10-13 NOTE — Patient Instructions (Addendum)
i have sent a prescription to your pharmacy, for an antibiotic.   Loratadine-d (non-prescription) will help your congestion.  Here is a prescription for cough syrup.  Please continue the same symbicort. A chest x-ray is requested for you today.  We'll let you know about the results. Please reduce the insulin to 3 times a day (just before each meal) 07-06-09 units. I hope you feel better soon.  If you don't feel better by next week, please call back.  Please call sooner if you get worse. check your blood sugar twice a day.  vary the time of day when you check, between before the 3 meals, and at bedtime.  also check if you have symptoms of your blood sugar being too high or too low.  please keep a record of the readings and bring it to your next appointment here (or you can bring the meter itself).  You can write it on any piece of paper.  please call us sooner if your blood sugar goes below 70, or if you have a lot of readings over 200. Please come back for a regular physical appointment in 4 months (must be after 01/29/16).

## 2015-11-03 ENCOUNTER — Other Ambulatory Visit: Payer: Self-pay | Admitting: Endocrinology

## 2015-12-02 ENCOUNTER — Other Ambulatory Visit: Payer: Self-pay | Admitting: Endocrinology

## 2015-12-20 ENCOUNTER — Other Ambulatory Visit: Payer: Self-pay

## 2015-12-20 DIAGNOSIS — Z1231 Encounter for screening mammogram for malignant neoplasm of breast: Secondary | ICD-10-CM

## 2015-12-23 ENCOUNTER — Other Ambulatory Visit: Payer: Self-pay | Admitting: Endocrinology

## 2016-01-27 DIAGNOSIS — Z794 Long term (current) use of insulin: Secondary | ICD-10-CM | POA: Diagnosis not present

## 2016-01-27 DIAGNOSIS — E119 Type 2 diabetes mellitus without complications: Secondary | ICD-10-CM | POA: Diagnosis not present

## 2016-01-27 DIAGNOSIS — Z961 Presence of intraocular lens: Secondary | ICD-10-CM | POA: Diagnosis not present

## 2016-01-28 ENCOUNTER — Ambulatory Visit: Payer: Medicare Other

## 2016-01-31 ENCOUNTER — Other Ambulatory Visit: Payer: Self-pay | Admitting: Endocrinology

## 2016-03-17 ENCOUNTER — Ambulatory Visit: Payer: Medicare Other

## 2016-03-27 ENCOUNTER — Other Ambulatory Visit: Payer: Self-pay | Admitting: Endocrinology

## 2016-03-29 ENCOUNTER — Other Ambulatory Visit: Payer: Self-pay | Admitting: Endocrinology

## 2016-03-29 ENCOUNTER — Ambulatory Visit: Payer: Medicare Other

## 2016-04-12 ENCOUNTER — Ambulatory Visit (INDEPENDENT_AMBULATORY_CARE_PROVIDER_SITE_OTHER): Payer: Medicare Other | Admitting: Endocrinology

## 2016-04-12 ENCOUNTER — Encounter: Payer: Self-pay | Admitting: Endocrinology

## 2016-04-12 VITALS — BP 180/84 | HR 58 | Temp 98.1°F | Ht 64.0 in | Wt 201.4 lb

## 2016-04-12 DIAGNOSIS — Z23 Encounter for immunization: Secondary | ICD-10-CM | POA: Diagnosis not present

## 2016-04-12 DIAGNOSIS — E1122 Type 2 diabetes mellitus with diabetic chronic kidney disease: Secondary | ICD-10-CM | POA: Diagnosis not present

## 2016-04-12 DIAGNOSIS — Z0001 Encounter for general adult medical examination with abnormal findings: Secondary | ICD-10-CM | POA: Diagnosis not present

## 2016-04-12 DIAGNOSIS — E785 Hyperlipidemia, unspecified: Secondary | ICD-10-CM

## 2016-04-12 DIAGNOSIS — M810 Age-related osteoporosis without current pathological fracture: Secondary | ICD-10-CM

## 2016-04-12 DIAGNOSIS — E109 Type 1 diabetes mellitus without complications: Secondary | ICD-10-CM | POA: Diagnosis not present

## 2016-04-12 DIAGNOSIS — N183 Chronic kidney disease, stage 3 (moderate): Secondary | ICD-10-CM | POA: Diagnosis not present

## 2016-04-12 DIAGNOSIS — Z794 Long term (current) use of insulin: Secondary | ICD-10-CM | POA: Diagnosis not present

## 2016-04-12 DIAGNOSIS — D509 Iron deficiency anemia, unspecified: Secondary | ICD-10-CM | POA: Diagnosis not present

## 2016-04-12 DIAGNOSIS — I10 Essential (primary) hypertension: Secondary | ICD-10-CM

## 2016-04-12 DIAGNOSIS — E119 Type 2 diabetes mellitus without complications: Secondary | ICD-10-CM | POA: Insufficient documentation

## 2016-04-12 DIAGNOSIS — B351 Tinea unguium: Secondary | ICD-10-CM

## 2016-04-12 DIAGNOSIS — R51 Headache: Secondary | ICD-10-CM

## 2016-04-12 LAB — MICROALBUMIN / CREATININE URINE RATIO
Creatinine,U: 141.1 mg/dL
Microalb Creat Ratio: 12 mg/g (ref 0.0–30.0)
Microalb, Ur: 17 mg/dL — ABNORMAL HIGH (ref 0.0–1.9)

## 2016-04-12 LAB — LIPID PANEL
CHOLESTEROL: 112 mg/dL (ref 0–200)
HDL: 41.9 mg/dL (ref >39.00)
LDL CALC: 52 mg/dL (ref 0–99)
NonHDL: 69.63
TRIGLYCERIDES: 87 mg/dL (ref 0.0–149.0)
Total CHOL/HDL Ratio: 3
VLDL: 17.4 mg/dL (ref 0.0–40.0)

## 2016-04-12 LAB — IBC PANEL
IRON: 101 ug/dL (ref 42–145)
Saturation Ratios: 29.4 % (ref 20.0–50.0)
TRANSFERRIN: 245 mg/dL (ref 212.0–360.0)

## 2016-04-12 LAB — HEPATIC FUNCTION PANEL
ALBUMIN: 4.1 g/dL (ref 3.5–5.2)
ALT: 12 U/L (ref 0–35)
AST: 14 U/L (ref 0–37)
Alkaline Phosphatase: 68 U/L (ref 39–117)
Bilirubin, Direct: 0.4 mg/dL — ABNORMAL HIGH (ref 0.0–0.3)
TOTAL PROTEIN: 7.4 g/dL (ref 6.0–8.3)
Total Bilirubin: 1.4 mg/dL — ABNORMAL HIGH (ref 0.2–1.2)

## 2016-04-12 LAB — CBC WITH DIFFERENTIAL/PLATELET
Basophils Absolute: 0 10*3/uL (ref 0.0–0.1)
Basophils Relative: 0.4 % (ref 0.0–3.0)
EOS ABS: 0.2 10*3/uL (ref 0.0–0.7)
Eosinophils Relative: 2.7 % (ref 0.0–5.0)
HCT: 34.7 % — ABNORMAL LOW (ref 36.0–46.0)
HEMOGLOBIN: 11.3 g/dL — AB (ref 12.0–15.0)
Lymphocytes Relative: 24 % (ref 12.0–46.0)
Lymphs Abs: 2 10*3/uL (ref 0.7–4.0)
MCHC: 32.6 g/dL (ref 30.0–36.0)
MCV: 82.9 fl (ref 78.0–100.0)
MONO ABS: 0.6 10*3/uL (ref 0.1–1.0)
Monocytes Relative: 7.3 % (ref 3.0–12.0)
Neutro Abs: 5.5 10*3/uL (ref 1.4–7.7)
Neutrophils Relative %: 65.6 % (ref 43.0–77.0)
Platelets: 163 10*3/uL (ref 150.0–400.0)
RBC: 4.18 Mil/uL (ref 3.87–5.11)
RDW: 17.1 % — ABNORMAL HIGH (ref 11.5–15.5)
WBC: 8.3 10*3/uL (ref 4.0–10.5)

## 2016-04-12 LAB — BASIC METABOLIC PANEL
BUN: 23 mg/dL (ref 6–23)
CHLORIDE: 112 meq/L (ref 96–112)
CO2: 25 mEq/L (ref 19–32)
Calcium: 9.3 mg/dL (ref 8.4–10.5)
Creatinine, Ser: 1.47 mg/dL — ABNORMAL HIGH (ref 0.40–1.20)
GFR: 44.22 mL/min — ABNORMAL LOW (ref >60.00)
GLUCOSE: 97 mg/dL (ref 70–99)
POTASSIUM: 3.2 meq/L — AB (ref 3.5–5.1)
Sodium: 138 mEq/L (ref 135–145)

## 2016-04-12 LAB — URINALYSIS, ROUTINE W REFLEX MICROSCOPIC
Bilirubin Urine: NEGATIVE
Ketones, ur: NEGATIVE
LEUKOCYTES UA: NEGATIVE
NITRITE: NEGATIVE
PH: 6 (ref 5.0–8.0)
SPECIFIC GRAVITY, URINE: 1.01 (ref 1.000–1.030)
Total Protein, Urine: 30 — AB
UROBILINOGEN UA: 1 (ref 0.0–1.0)
Urine Glucose: NEGATIVE
WBC, UA: NONE SEEN (ref 0–?)

## 2016-04-12 LAB — POCT GLYCOSYLATED HEMOGLOBIN (HGB A1C): HEMOGLOBIN A1C: 5.6

## 2016-04-12 LAB — TSH: TSH: 1.37 u[IU]/mL (ref 0.35–4.50)

## 2016-04-12 MED ORDER — LOSARTAN POTASSIUM-HCTZ 100-25 MG PO TABS: 1.0000 | ORAL_TABLET | Freq: Every day | ORAL | Status: DC

## 2016-04-12 MED ORDER — TERBINAFINE HCL 250 MG PO TABS: 250.0000 mg | ORAL_TABLET | Freq: Every day | ORAL | Status: DC

## 2016-04-12 MED ORDER — AMLODIPINE BESYLATE 5 MG PO TABS: 5.0000 mg | ORAL_TABLET | Freq: Every day | ORAL | Status: DC

## 2016-04-12 MED ORDER — POTASSIUM CHLORIDE ER 10 MEQ PO TBCR: 10.0000 meq | EXTENDED_RELEASE_TABLET | Freq: Every day | ORAL | Status: DC

## 2016-04-12 MED ORDER — TRIAMCINOLONE ACETONIDE 0.1 % EX CREA: 1.0000 "application " | TOPICAL_CREAM | Freq: Two times a day (BID) | CUTANEOUS | Status: DC

## 2016-04-12 NOTE — Progress Notes (Signed)
Subjective:    Patient ID: [name], female    DOB: [birth year], 80 y.o.   MRN: [id number]  HPI Pt is here for regular wellness examination, and is feeling pretty well in general, and says chronic med probs are stable, except as noted below Past Medical History  Diagnosis Date  . Anterior chest wall pain   . Cough   . UTI (urinary tract infection)   . HTN (hypertension)   . Hyperlipidemia   . History of diverticulitis of colon   . DM type 2 (diabetes mellitus, type 2) ([place])   . Asthma   . Anxiety   . Iron deficiency anemia   . Allergic rhinitis   . IBS (irritable bowel syndrome)   . GERD (gastroesophageal reflux disease)   . Osteoporosis, unspecified     Past Surgical History  Procedure Laterality Date  . Esophagogastroduodenoscopy  12/26/01  . Cardiovascular stress test  02/25/04  . Right oophorectomy  jan 2010    Social History   Social History  . Marital Status: Widowed    Spouse Name: N/A  . Number of Children: N/A  . Years of Education: N/A   Occupational History  . retired    Social History Main Topics  . Smoking status: Former Smoker -- 0.30 packs/day for 5 years    Types: Cigarettes    Quit date: 10/02/1986  . Smokeless tobacco: Never Used  . Alcohol Use: No  . Drug Use: No  . Sexual Activity: Not on file   Other Topics Concern  . Not on file   Social History Narrative    Current Outpatient Prescriptions on File Prior to Visit  Medication Sig Dispense Refill  . acetaminophen (TYLENOL) 325 MG tablet Take 650 mg by mouth every 6 (six) hours as needed for pain.    Marland Kitchen albuterol (PROAIR HFA) 108 (90 Base) MCG/ACT inhaler Inhale 2 puffs into the lungs every 4 (four) hours as needed for wheezing. 1 Inhaler 0  . Blood Glucose Monitoring Suppl (ONE TOUCH ULTRA 2) W/DEVICE KIT Check blood sugar 3 times per day. 1 each 0  . ELIQUIS 5 MG TABS tablet TAKE 1 TABLET (5 MG TOTAL) BY MOUTH 2 (TWO) TIMES DAILY. 60 tablet 9  . insulin lispro (HUMALOG) 100 UNIT/ML  injection 3 times a day (just before each meal) 07-06-09 units, and syringes 3/day 20 mL 11  . Insulin Syringe-Needle U-100 (BD INSULIN SYRINGE ULTRAFINE) 31G X 5/16" 0.3 ML MISC Use as directed     . ONE TOUCH LANCETS MISC Use as directed three times daily     . pravastatin (PRAVACHOL) 40 MG tablet TAKE 1 TABLET BY MOUTH DAILY 90 tablet 3  . SYMBICORT 160-4.5 MCG/ACT inhaler INHALE 2 PUFFS TWICE DAILY 10.2 Inhaler 8   No current facility-administered medications on file prior to visit.    Allergies  Allergen Reactions  . Aspirin   . Ramipril     REACTION: Cough    Family History  Problem Relation Age of Onset  . Atopy Neg Hx   . Asthma Neg Hx   . Lung cancer Brother   . Melanoma Brother     BP 180/84 mmHg  Pulse 58  Temp(Src) 98.1 F (36.7 C) (Oral)  Ht _0  (1.626 m)  Wt 201 lb 6.4 oz (91.354 kg)  BMI 34.55 kg/m2  SpO2 98%   Review of Systems  Constitutional: Negative for fever.  HENT: Negative for hearing loss.   Eyes: Negative for visual disturbance.  Respiratory: Negative for shortness of breath.   Cardiovascular: Negative for chest pain.  Gastrointestinal: Negative for blood in stool.  Endocrine: Negative for cold intolerance.  Genitourinary: Negative for hematuria.  Musculoskeletal: Negative for gait problem.  Skin: Negative for rash.  Allergic/Immunologic: Positive for environmental allergies.  Neurological: Negative for numbness.  Hematological: Does not bruise/bleed easily.  Psychiatric/Behavioral: Negative for dysphoric mood.       Objective:   Physical Exam VS: see vs page GEN: no distress HEAD: head: no deformity eyes: no periorbital swelling, no proptosis external nose and ears are normal mouth: no lesion seen NECK: supple, thyroid is not enlarged CHEST WALL: no deformity LUNGS: clear to auscultation CV: reg rate but irreg rhythm, no murmur ABD: abdomen is soft, nontender.  no hepatosplenomegaly.  not distended.  no  hernia MUSCULOSKELETAL: muscle bulk and strength are grossly normal.  no obvious joint swelling.  gait is normal and steady PULSES: no carotid bruit NEURO:  cn 2-12 grossly intact.   readily moves all 4's.   SKIN:  Normal texture and temperature.  No rash or suspicious lesion is visible.  Multiple seborrheic keratoses.    NODES:  None palpable at the neck PSYCH: alert, well-oriented.  Does not appear anxious nor depressed.        Assessment & Plan:  Wellness visit today, with problems stable, except as noted.   SEPARATE EVALUATION FOLLOWS--EACH PROBLEM HERE IS NEW, NOT RESPONDING TO TREATMENT, OR POSES SIGNIFICANT RISK TO THE PATIENT'S HEALTH: HISTORY OF THE PRESENT ILLNESS: Pt returns for f/u of diabetes mellitus: DM type: Insulin-requiring type 2 Dx'ed: 5809 Complications: renal insuff Therapy: insulin since 2008 GDM: never DKA: never Severe hypoglycemia: never Pancreatitis: never Other: she takes multiple daily injections.   Interval history: no cbg record, but states cbg's are in the 200's.   PAST MEDICAL HISTORY reviewed and up to date today REVIEW OF SYSTEMS: She has intermittent headache.  Denies syncope. PHYSICAL EXAMINATION: VITAL SIGNS:  See vs page GENERAL: no distress Pulses: dorsalis pedis intact bilat.   MSK: no deformity of the feet CV: trace bilat leg edema Skin:  no ulcer on the feet.  normal color and temp on the feet. Neuro: sensation is intact to touch on the feet EXT: There is bilateral onychomycosis of the toenails LAB/XRAY RESULTS: Lab Results  Component Value Date   HGBA1C 5.6 04/12/2016   Fructosamine 0 - 285 umol/L 292 (H)       i personally reviewed electrocardiogram tracing (today): Indication:  Impression: AF with VR of 56 IMPRESSION: Insulin-requiring type 2 DM: well-controlled.  Per fructosamine, not overcontrolled.  Per cbg's not well-controlled Onychomycosis, persistent Headache, uncertain etiology HTN: worse PLAN:  Please  continue the same insulin I have sent a prescription to your pharmacy, to increase the losartan-HCTZ. Please call if the headache keeps up, adn I would be happy to refer you to a specialist.   blood tests are requested for you today.  We'll let you know about the results.  I have sent a prescription to your pharmacy, for the toenail fungus Renato Shin, MD

## 2016-04-12 NOTE — Patient Instructions (Addendum)
I have sent a prescription to your pharmacy, to increase the losartan-HCTZ. Please call if the headache keeps up, adn I would be happy to refer you to a specialist.   blood tests are requested for you today.  We'll let you know about the results.  I have sent a prescription to your pharmacy, for the toenail fungus Please consider these measures for your health:  minimize alcohol.  Do not use tobacco products.  Have a colonoscopy at least every 10 years from age 49.  Women should have an annual mammogram from age 55.  Keep firearms safely stored.  Always use seat belts.  have working smoke alarms in your home.  See an eye doctor and dentist regularly.  Never drive under the influence of alcohol or drugs (including prescription drugs).  Those with fair skin should take precautions against the sun, and should carefully examine their skin once per month, for any new or changed moles.  Please come back for a follow-up appointment in 3 months.

## 2016-04-13 LAB — PTH, INTACT AND CALCIUM
Calcium: 8.7 mg/dL (ref 8.4–10.5)
PTH: 117 pg/mL — ABNORMAL HIGH (ref 14–64)

## 2016-04-13 LAB — FRUCTOSAMINE: Fructosamine: 292 umol/L — ABNORMAL HIGH (ref 0–285)

## 2016-04-14 ENCOUNTER — Telehealth: Payer: Self-pay | Admitting: Endocrinology

## 2016-04-14 ENCOUNTER — Other Ambulatory Visit: Payer: Self-pay | Admitting: Endocrinology

## 2016-04-14 LAB — VITAMIN D 25 HYDROXY (VIT D DEFICIENCY, FRACTURES): VITD: 12.37 ng/mL — AB (ref 30.00–100.00)

## 2016-04-14 MED ORDER — VITAMIN D (ERGOCALCIFEROL) 1.25 MG (50000 UNIT) PO CAPS: 50000.0000 [IU] | ORAL_CAPSULE | ORAL | Status: DC

## 2016-04-14 NOTE — Telephone Encounter (Signed)
Patient ask why was she she prescribed this medication Klor-con

## 2016-04-14 NOTE — Telephone Encounter (Signed)
Spoke to pt. Advised her that her labs showed her potassium was a little low. That is why she is on K-Dur

## 2016-04-14 NOTE — Addendum Note (Signed)
Addended by: Kaylyn Lim I on: 04/14/2016 03:06 PM   Modules accepted: Orders

## 2016-04-17 ENCOUNTER — Telehealth: Payer: Self-pay

## 2016-04-17 ENCOUNTER — Telehealth: Payer: Self-pay | Admitting: Endocrinology

## 2016-04-17 NOTE — Telephone Encounter (Signed)
Patient ask you to call as soon as you get a chance.

## 2016-04-17 NOTE — Telephone Encounter (Signed)
Requested a call back from the pt to discuss.  

## 2016-04-17 NOTE — Telephone Encounter (Signed)
Called and left voicemail for patient to return phone call for lab results and instructions on new rx that was sent to pharmacy.

## 2016-04-18 NOTE — Telephone Encounter (Signed)
Requested a call back from the pt to discuss.  

## 2016-04-18 NOTE — Telephone Encounter (Signed)
PT called back to speak with you, requests call back.

## 2016-04-19 ENCOUNTER — Telehealth: Payer: Self-pay

## 2016-04-19 NOTE — Telephone Encounter (Signed)
-----   Message from Renato Shin, MD sent at 04/14/2016  6:26 PM EDT ----- please call patient: Vitamin-D is low I have sent a prescription to your pharmacy, for a prescription for high-dose vitamin-D You take it 3 times per week x 12 pills, then you are done.

## 2016-04-19 NOTE — Telephone Encounter (Signed)
Called and spoke with patient about lab work. Advised of new rx that was sent into pharmacy and how to take it. Patient had no other questions or concerns.

## 2016-04-25 ENCOUNTER — Other Ambulatory Visit: Payer: Self-pay | Admitting: Endocrinology

## 2016-04-29 ENCOUNTER — Other Ambulatory Visit: Payer: Self-pay | Admitting: Endocrinology

## 2016-05-04 ENCOUNTER — Ambulatory Visit
Admission: RE | Admit: 2016-05-04 | Discharge: 2016-05-04 | Disposition: A | Discharge status: Home / Self Care | Payer: Medicare Other | Source: Ambulatory Visit

## 2016-05-04 DIAGNOSIS — Z1231 Encounter for screening mammogram for malignant neoplasm of breast: Secondary | ICD-10-CM | POA: Diagnosis not present

## 2016-05-08 ENCOUNTER — Other Ambulatory Visit: Payer: Self-pay | Admitting: Endocrinology

## 2016-05-08 DIAGNOSIS — R928 Other abnormal and inconclusive findings on diagnostic imaging of breast: Secondary | ICD-10-CM

## 2016-05-10 ENCOUNTER — Telehealth: Payer: Self-pay | Admitting: Endocrinology

## 2016-05-10 ENCOUNTER — Other Ambulatory Visit: Payer: Self-pay

## 2016-05-10 ENCOUNTER — Other Ambulatory Visit: Payer: Self-pay | Admitting: Endocrinology

## 2016-05-10 DIAGNOSIS — R928 Other abnormal and inconclusive findings on diagnostic imaging of breast: Secondary | ICD-10-CM

## 2016-05-10 NOTE — Telephone Encounter (Signed)
I contacted Felia with the [facility] center. She advised me the written order is no longer needed because a cosigned order is within epic. She had no further questions at this time.

## 2016-05-10 NOTE — Telephone Encounter (Signed)
Felia at [facility] called, she stated there was an order for the PT faxed over and she is needing that back before the PT comes in to see them tomorrow.   Her CB # [phone]

## 2016-05-11 ENCOUNTER — Ambulatory Visit
Admission: RE | Admit: 2016-05-11 | Discharge: 2016-05-11 | Disposition: A | Discharge status: Home / Self Care | Payer: Medicare Other | Source: Ambulatory Visit | Attending: Endocrinology | Admitting: Endocrinology

## 2016-05-11 DIAGNOSIS — R928 Other abnormal and inconclusive findings on diagnostic imaging of breast: Secondary | ICD-10-CM

## 2016-05-11 DIAGNOSIS — N63 Unspecified lump in breast: Secondary | ICD-10-CM | POA: Diagnosis not present

## 2016-05-29 ENCOUNTER — Ambulatory Visit (INDEPENDENT_AMBULATORY_CARE_PROVIDER_SITE_OTHER): Payer: Medicare Other | Admitting: Endocrinology

## 2016-05-29 ENCOUNTER — Encounter: Payer: Self-pay | Admitting: Endocrinology

## 2016-05-29 VITALS — BP 134/80 | HR 80 | Temp 98.6°F | Wt 195.0 lb

## 2016-05-29 DIAGNOSIS — Z0000 Encounter for general adult medical examination without abnormal findings: Secondary | ICD-10-CM

## 2016-05-29 DIAGNOSIS — R059 Cough, unspecified: Secondary | ICD-10-CM

## 2016-05-29 DIAGNOSIS — J069 Acute upper respiratory infection, unspecified: Secondary | ICD-10-CM | POA: Diagnosis not present

## 2016-05-29 DIAGNOSIS — R05 Cough: Secondary | ICD-10-CM

## 2016-05-29 MED ORDER — CEFUROXIME AXETIL 250 MG PO TABS: 250.0000 mg | ORAL_TABLET | Freq: Two times a day (BID) | ORAL | 0 refills | Status: DC

## 2016-05-29 MED ORDER — PROMETHAZINE-CODEINE 6.25-10 MG/5ML PO SYRP: 5.0000 mL | ORAL_SOLUTION | ORAL | 0 refills | Status: DC | PRN

## 2016-05-29 NOTE — Patient Instructions (Addendum)
I have sent a prescription to your pharmacy, for an antibiotic pill.  Here is a prescription, for the cough.  good diet and exercise significantly improve the control of your diabetes.  please let me know if you wish to be referred to a dietician.  high blood sugar is very risky to your health.  you should see an eye doctor and dentist every year.  It is very important to get all recommended vaccinations.  Please consider these measures for your health:  minimize alcohol.  Do not use tobacco products.  Have a colonoscopy at least every 10 years from age 9.  Women should have an annual mammogram from age 64.  Keep firearms safely stored.  Always use seat belts.  have working smoke alarms in your home.  See an eye doctor and dentist regularly.  Never drive under the influence of alcohol or drugs (including prescription drugs).   please let me know what your wishes would be, if artificial life support measures should become necessary.  It is critically important to prevent falling down (keep floor areas well-lit, dry, and free of loose objects.  If you have a cane, walker, or wheelchair, you should use it, even for short trips around the house.  Wear flat-soled shoes.  Also, try not to rush).

## 2016-05-29 NOTE — Progress Notes (Signed)
Subjective:    Patient ID: [name], female    DOB: [birth year], 80 y.o.   MRN: [id number]  HPI Pt states 1 week of slight pain at the throat, and assoc nausea.  Past Medical History:  Diagnosis Date  . Allergic rhinitis   . Anterior chest wall pain   . Anxiety   . Asthma   . Cough   . DM type 2 (diabetes mellitus, type 2) ([place])   . GERD (gastroesophageal reflux disease)   . History of diverticulitis of colon   . HTN (hypertension)   . Hyperlipidemia   . IBS (irritable bowel syndrome)   . Iron deficiency anemia   . Osteoporosis, unspecified   . UTI (urinary tract infection)     Past Surgical History:  Procedure Laterality Date  . CARDIOVASCULAR STRESS TEST  02/25/04  . ESOPHAGOGASTRODUODENOSCOPY  12/26/01  . RIGHT OOPHORECTOMY  jan 2010    Social History   Social History  . Marital status: Widowed    Spouse name: N/A  . Number of children: N/A  . Years of education: N/A   Occupational History  . retired    Social History Main Topics  . Smoking status: Former Smoker    Packs/day: 0.30    Years: 5.00    Types: Cigarettes    Quit date: 10/02/1986  . Smokeless tobacco: Never Used  . Alcohol use No  . Drug use: No  . Sexual activity: Not on file   Other Topics Concern  . Not on file   Social History Narrative  . No narrative on file    Current Outpatient Prescriptions on File Prior to Visit  Medication Sig Dispense Refill  . acetaminophen (TYLENOL) 325 MG tablet Take 650 mg by mouth every 6 (six) hours as needed for pain.    Marland Kitchen albuterol (PROAIR HFA) 108 (90 Base) MCG/ACT inhaler Inhale 2 puffs into the lungs every 4 (four) hours as needed for wheezing. 1 Inhaler 0  . amLODipine (NORVASC) 5 MG tablet Take 1 tablet (5 mg total) by mouth daily. 90 tablet 3  . Blood Glucose Monitoring Suppl (ONE TOUCH ULTRA 2) W/DEVICE KIT Check blood sugar 3 times per day. 1 each 0  . ELIQUIS 5 MG TABS tablet TAKE 1 TABLET (5 MG TOTAL) BY MOUTH 2 (TWO) TIMES DAILY. 60 tablet 9   . insulin lispro (HUMALOG) 100 UNIT/ML injection 3 times a day (just before each meal) 07-06-09 units, and syringes 3/day 20 mL 11  . Insulin Syringe-Needle U-100 (BD INSULIN SYRINGE ULTRAFINE) 31G X 5/16" 0.3 ML MISC Use as directed     . losartan-hydrochlorothiazide (HYZAAR) 100-12.5 MG tablet TAKE 1 TABLET EVERY DAY 30 tablet 0  . montelukast (SINGULAIR) 10 MG tablet TAKE 1 TABLET BY MOUTH AT BEDTIME 90 tablet 0  . ONE TOUCH LANCETS MISC Use as directed three times daily     . potassium chloride (K-DUR) 10 MEQ tablet Take 1 tablet (10 mEq total) by mouth daily. 30 tablet 11  . pravastatin (PRAVACHOL) 40 MG tablet TAKE 1 TABLET BY MOUTH DAILY 90 tablet 3  . SYMBICORT 160-4.5 MCG/ACT inhaler INHALE 2 PUFFS TWICE DAILY 10.2 Inhaler 8  . terbinafine (LAMISIL) 250 MG tablet Take 1 tablet (250 mg total) by mouth daily. 90 tablet 0  . triamcinolone cream (KENALOG) 0.1 % Apply 1 application topically 2 (two) times daily. 45 g 5   No current facility-administered medications on file prior to visit.     Allergies  Allergen  Reactions  . Aspirin   . Ramipril     REACTION: Cough    Family History  Problem Relation Age of Onset  . Atopy Neg Hx   . Asthma Neg Hx   . Lung cancer Brother   . Melanoma Brother     BP 134/80   Pulse 80   Temp 98.6 F (37 C) (Oral)   Wt 195 lb (88.5 kg)   BMI 33.47 kg/m    Review of Systems Fever is resolved.  She has a slight dry cough.     Objective:   Physical Exam VITAL SIGNS:  See vs page GENERAL: no distress head: no deformity  eyes: no periorbital swelling, no proptosis  external nose and ears are normal  mouth: no lesion seen.  Both eac's and tm's are normal. LUNGS:  Clear to auscultation, except for a few diffuse wheezes.       Assessment & Plan:  URI: new.   Subjective:   Patient here for Medicare annual wellness visit and management of other chronic and acute problems.     Risk factors: advanced age    [employer]  Providing Medical Care to Patient:  See "snapshot"  Activities of Daily Living: In your present state of health, do you have any difficulty performing the following activities (lives alone)?:  Preparing food and eating?: No  Bathing yourself: No  Getting dressed: No  Using the toilet:No  Moving around from place to place: No  In the past year have you fallen or had a near fall?: No    Home Safety: Has smoke detector and wears seat belts. No firearms. No excess sun exposure.  Diet and Exercise  Current exercise habits: pt says not good Dietary issues discussed: pt reports she does not eat a healthy diet   Depression Screen  Q1: Over the past two weeks, have you felt down, depressed or hopeless? no  Q2: Over the past two weeks, have you felt little interest or pleasure in doing things? no   The following portions of the patient's history were reviewed and updated as appropriate: allergies, current medications, past family history, past medical history, past social history, past surgical history and problem list.   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   Hearing: grossly normal Body mass index:  See vs page Msk: pt easily and quickly performs "get-up-and-go" from a sitting position Cognitive Impairment Assessment: cognition, memory and judgment appear normal.  remembers 2/3 at 5 minutes (? effort).  excellent recall.  can easily read and write a sentence.  alert and oriented x 3.     Assessment:   Medicare wellness utd on preventive parameters.     Plan:   During the course of the visit the patient was educated and counseled about appropriate screening and preventive services including:        Fall prevention   Screening mammography  Bone densitometry screening  Diabetes screening.   Nutrition counseling.   Vaccines / LABS Zostavax / Pneumococcal Vaccine  today   Patient Instructions  (the written plan) was given to the patient.

## 2016-05-29 NOTE — Progress Notes (Signed)
we discussed code status.  pt requests full code, but would not want to be started or maintained on artificial life-support measures if there was not a reasonable chance of recovery 

## 2016-06-01 ENCOUNTER — Telehealth: Payer: Self-pay | Admitting: Endocrinology

## 2016-06-01 MED ORDER — METRONIDAZOLE 250 MG PO TABS: 250.0000 mg | ORAL_TABLET | Freq: Three times a day (TID) | ORAL | 0 refills | Status: DC

## 2016-06-01 NOTE — Telephone Encounter (Signed)
It usually goes away on its own. However, I have sent a prescription to your pharmacy, to change to a different antibiotic.

## 2016-06-01 NOTE — Telephone Encounter (Signed)
See message and please advise, Thanks!  

## 2016-06-01 NOTE — Telephone Encounter (Signed)
Pt has been having issues with diarrhea really bad

## 2016-06-02 NOTE — Telephone Encounter (Signed)
Patient notified and voiced understanding.

## 2016-06-09 ENCOUNTER — Encounter: Payer: Self-pay | Admitting: Internal Medicine

## 2016-06-14 DIAGNOSIS — H1131 Conjunctival hemorrhage, right eye: Secondary | ICD-10-CM | POA: Diagnosis not present

## 2016-06-23 NOTE — Progress Notes (Signed)
Cardiology Office Note   Date:  06/26/2016   ID:  [name], DOB 1935-10-07, MRN [id number]  PCP:  Renato Shin, MD  Cardiologist:   Dorris Carnes, MD   F/U of PAF      History of Present Illness: [name] is a 80 y.o. female with a history of HTN, DM, coronary artery calcificatiosn and hot flasshess  SHe also had an epsode in ED of atrial fib  CHADSVASc of 5    I saw her in 2015 Doing good  Occasional flutter  No dizziness Fluttering lasts a few min   NOt very acite   Gets SOB with activity  ? Due to the asthma that she has  NOt a lot of wheezing  Was a couple months ago   On CP  Got a little SOB with wlking    Current Meds  Medication Sig  . acetaminophen (TYLENOL) 325 MG tablet Take 650 mg by mouth every 6 (six) hours as needed for pain.  Marland Kitchen albuterol (PROAIR HFA) 108 (90 Base) MCG/ACT inhaler Inhale 2 puffs into the lungs every 4 (four) hours as needed for wheezing.  Marland Kitchen amLODipine (NORVASC) 5 MG tablet Take 1 tablet (5 mg total) by mouth daily.  Marland Kitchen apixaban (ELIQUIS) 5 MG TABS tablet Take 5 mg by mouth 2 (two) times daily.  . Blood Glucose Monitoring Suppl (ONE TOUCH ULTRA 2) W/DEVICE KIT Check blood sugar 3 times per day.  . budesonide-formoterol (SYMBICORT) 160-4.5 MCG/ACT inhaler Inhale 2 puffs into the lungs 2 (two) times daily.  . insulin lispro (HUMALOG) 100 UNIT/ML injection Inject 10 units into the skin before breakfast and dinner. Inject 5 units into the skin before lunch.  . Insulin Syringe-Needle U-100 (BD INSULIN SYRINGE ULTRAFINE) 31G X 5/16" 0.3 ML MISC Use as directed   . losartan-hydrochlorothiazide (HYZAAR) 100-12.5 MG tablet Take 1 tablet by mouth daily.  . montelukast (SINGULAIR) 10 MG tablet TAKE 1 TABLET BY MOUTH AT BEDTIME  . ONE TOUCH LANCETS MISC Use as directed three times daily   . potassium chloride (K-DUR) 10 MEQ tablet Take 1 tablet (10 mEq total) by mouth daily.  . pravastatin (PRAVACHOL) 40 MG tablet TAKE 1 TABLET BY MOUTH DAILY  . terbinafine  (LAMISIL) 250 MG tablet Take 1 tablet (250 mg total) by mouth daily.  Marland Kitchen triamcinolone cream (KENALOG) 0.1 % Apply 1 application topically 2 (two) times daily.        Allergies:   Aspirin and Ramipril   Past Medical History:  Diagnosis Date  . Allergic rhinitis   . Anterior chest wall pain   . Anxiety   . Asthma   . Cough   . DM type 2 (diabetes mellitus, type 2) ([place])   . GERD (gastroesophageal reflux disease)   . History of diverticulitis of colon   . HTN (hypertension)   . Hyperlipidemia   . IBS (irritable bowel syndrome)   . Iron deficiency anemia   . Osteoporosis, unspecified   . UTI (urinary tract infection)     Past Surgical History:  Procedure Laterality Date  . CARDIOVASCULAR STRESS TEST  02/25/04  . ESOPHAGOGASTRODUODENOSCOPY  12/26/01  . RIGHT OOPHORECTOMY  jan 2010     Social History:  The patient  reports that she quit smoking about 29 years ago. Her smoking use included Cigarettes. She has a 1.50 pack-year smoking history. She has never used smokeless tobacco. She reports that she does not drink alcohol or use drugs.   Family History:  The patient's family history includes Hypertension in her mother; Lung cancer in her brother; Melanoma in her brother; Other in her father; Stroke in her mother.    ROS:  Please see the history of present illness. All other systems are reviewed and  Negative to the above problem except as noted.    PHYSICAL EXAM: VS:  BP 140/78   Pulse (!) 59   Ht _0  (1.702 m)   Wt 199 lb (90.3 kg)   BMI 31.17 kg/m   GEN: Well nourished, well developed, in no acute distress  HEENT: normal  Neck: no JVD, carotid bruits, or masses Cardiac: RRR; no murmurs, rubs, or gallops,no edema  Respiratory:  clear to auscultation bilaterally, normal work of breathing GI: soft, nontender, nondistended, + BS  No hepatomegaly  MS: no deformity Moving all extremities   Skin: warm and dry, no rash Neuro:  Strength and sensation are intact Psych:  euthymic mood, full affect   EKG:  EKG is  Not ordered today.  In July Atrial fib  56 bpm  RBBB   Lipid Panel    Component Value Date/Time   CHOL 112 04/12/2016 1203   TRIG 87.0 04/12/2016 1203   TRIG 83 08/07/2006 1419   HDL 41.90 04/12/2016 1203   CHOLHDL 3 04/12/2016 1203   VLDL 17.4 04/12/2016 1203   LDLCALC 52 04/12/2016 1203      Wt Readings from Last 3 Encounters:  06/26/16 199 lb (90.3 kg)  05/29/16 195 lb (88.5 kg)  04/12/16 201 lb 6.4 oz (91.4 kg)      ASSESSMENT AND PLAN:  1  Atrial fib  Continue on current regimen  Check CBC and BMET  2.  Dyspnea  Will  Set up for stress myoview  I want to see what her exercise capacity/chronotropic completence  Swtich to Lexi if cant get HR up    3  Lpids  Good control  F?U 1 year     Disposition:   FU with  in   Signed, Dorris Carnes, MD  06/26/2016 8:36 AM    [address], [place], [place]  [address] Phone: [phone]; Fax: [phone]

## 2016-06-24 ENCOUNTER — Other Ambulatory Visit: Payer: Self-pay | Admitting: Internal Medicine

## 2016-06-26 ENCOUNTER — Encounter: Payer: Self-pay | Admitting: Internal Medicine

## 2016-06-26 ENCOUNTER — Ambulatory Visit (INDEPENDENT_AMBULATORY_CARE_PROVIDER_SITE_OTHER): Payer: Medicare Other | Admitting: Internal Medicine

## 2016-06-26 VITALS — BP 140/78 | HR 59 | Ht 67.0 in | Wt 199.0 lb

## 2016-06-26 DIAGNOSIS — I4891 Unspecified atrial fibrillation: Secondary | ICD-10-CM | POA: Diagnosis not present

## 2016-06-26 DIAGNOSIS — R0602 Shortness of breath: Secondary | ICD-10-CM

## 2016-06-26 LAB — CBC
HCT: 34.6 % — ABNORMAL LOW (ref 35.0–45.0)
Hemoglobin: 11.9 g/dL (ref 11.7–15.5)
MCH: 27.7 pg (ref 27.0–33.0)
MCHC: 34.4 g/dL (ref 32.0–36.0)
MCV: 80.5 fL (ref 80.0–100.0)
MPV: 11.2 fL (ref 7.5–12.5)
Platelets: 158 10*3/uL (ref 140–400)
RBC: 4.3 MIL/uL (ref 3.80–5.10)
RDW: 17.6 % — ABNORMAL HIGH (ref 11.0–15.0)
WBC: 6.2 10*3/uL (ref 3.8–10.8)

## 2016-06-26 LAB — BASIC METABOLIC PANEL
BUN: 25 mg/dL (ref 7–25)
CHLORIDE: 108 mmol/L (ref 98–110)
CO2: 23 mmol/L (ref 20–31)
CREATININE: 1.59 mg/dL — AB (ref 0.60–0.93)
Calcium: 9.4 mg/dL (ref 8.6–10.4)
Glucose, Bld: 194 mg/dL — ABNORMAL HIGH (ref 65–99)
Potassium: 4.1 mmol/L (ref 3.5–5.3)
Sodium: 142 mmol/L (ref 135–146)

## 2016-06-26 NOTE — Patient Instructions (Signed)
Your physician recommends that you continue on your current medications as directed. Please refer to the Current Medication list given to you today.  Your physician recommends that you return for lab work today (BMET, CBC)  Your physician has requested that you have en exercise stress myoview. For further information please visit [email or web address]. Please follow instruction sheet, as given.  Your physician wants you to follow-up in: 1 year with Dr. Harrington Challenger.  You will receive a reminder letter in the mail two months in advance. If you don't receive a letter, please call our office to schedule the follow-up appointment.

## 2016-06-28 DIAGNOSIS — H43812 Vitreous degeneration, left eye: Secondary | ICD-10-CM | POA: Diagnosis not present

## 2016-07-05 ENCOUNTER — Encounter: Payer: Self-pay | Admitting: *Deleted

## 2016-07-10 ENCOUNTER — Other Ambulatory Visit: Payer: Self-pay | Admitting: *Deleted

## 2016-07-10 DIAGNOSIS — I10 Essential (primary) hypertension: Secondary | ICD-10-CM

## 2016-07-11 ENCOUNTER — Telehealth (HOSPITAL_COMMUNITY): Payer: Self-pay | Admitting: *Deleted

## 2016-07-11 NOTE — Telephone Encounter (Signed)
Left message on voicemail in reference to upcoming appointment scheduled for 07/13/16. Phone number given for a call back so details instructions can be given.  [name], [name]    

## 2016-07-13 ENCOUNTER — Other Ambulatory Visit: Payer: Medicare Other

## 2016-07-13 ENCOUNTER — Encounter (HOSPITAL_COMMUNITY): Payer: Medicare Other

## 2016-07-19 ENCOUNTER — Telehealth (HOSPITAL_COMMUNITY): Payer: Self-pay | Admitting: *Deleted

## 2016-07-19 NOTE — Telephone Encounter (Signed)
Attempted to call patient regarding upcoming appointment. Unable to reach patient and unable to leave a message.  [name]

## 2016-07-21 ENCOUNTER — Ambulatory Visit (HOSPITAL_COMMUNITY): Payer: Medicare Other | Attending: Internal Medicine

## 2016-07-21 ENCOUNTER — Other Ambulatory Visit: Payer: Medicare Other | Admitting: *Deleted

## 2016-07-21 DIAGNOSIS — I10 Essential (primary) hypertension: Secondary | ICD-10-CM

## 2016-07-21 DIAGNOSIS — R0602 Shortness of breath: Secondary | ICD-10-CM | POA: Insufficient documentation

## 2016-07-21 DIAGNOSIS — R9439 Abnormal result of other cardiovascular function study: Secondary | ICD-10-CM | POA: Insufficient documentation

## 2016-07-21 LAB — MYOCARDIAL PERFUSION IMAGING
CHL CUP NUCLEAR SRS: 4
CHL CUP NUCLEAR SSS: 11
CSEPPHR: 64 {beats}/min
LV dias vol: 81 mL (ref 46–106)
LV sys vol: 21 mL
NUC STRESS TID: 0.85
RATE: 0.3
Rest HR: 55 {beats}/min
SDS: 7

## 2016-07-21 LAB — BASIC METABOLIC PANEL
BUN: 23 mg/dL (ref 7–25)
CALCIUM: 8.7 mg/dL (ref 8.6–10.4)
CO2: 19 mmol/L — AB (ref 20–31)
Chloride: 106 mmol/L (ref 98–110)
Creat: 1.52 mg/dL — ABNORMAL HIGH (ref 0.60–0.93)
GLUCOSE: 162 mg/dL — AB (ref 65–99)
Potassium: 4.1 mmol/L (ref 3.5–5.3)
SODIUM: 140 mmol/L (ref 135–146)

## 2016-07-21 MED ORDER — REGADENOSON 0.4 MG/5ML IV SOLN
0.4000 mg | Freq: Once | INTRAVENOUS | Status: AC
  Administered 2016-07-21: 0.4 mg via INTRAVENOUS

## 2016-07-21 MED ORDER — TECHNETIUM TC 99M TETROFOSMIN IV KIT
31.3000 | PACK | Freq: Once | INTRAVENOUS | Status: AC | PRN
  Administered 2016-07-21: 31.3 via INTRAVENOUS
  Filled 2016-07-21: qty 32

## 2016-07-21 MED ORDER — TECHNETIUM TC 99M TETROFOSMIN IV KIT
10.6000 | PACK | Freq: Once | INTRAVENOUS | Status: AC | PRN
  Administered 2016-07-21: 10.6 via INTRAVENOUS
  Filled 2016-07-21: qty 11

## 2016-07-24 ENCOUNTER — Other Ambulatory Visit: Payer: Self-pay | Admitting: Endocrinology

## 2016-08-01 ENCOUNTER — Telehealth: Payer: Self-pay | Admitting: *Deleted

## 2016-08-01 DIAGNOSIS — R0602 Shortness of breath: Secondary | ICD-10-CM

## 2016-08-01 DIAGNOSIS — R9439 Abnormal result of other cardiovascular function study: Secondary | ICD-10-CM

## 2016-08-01 MED ORDER — LOSARTAN POTASSIUM-HCTZ 100-12.5 MG PO TABS: 0.5000 | ORAL_TABLET | Freq: Every day | ORAL | Status: DC

## 2016-08-01 NOTE — Telephone Encounter (Signed)
-----   Message from Fay Records, MD sent at 07/26/2016  7:36 PM EDT ----- Bonne Dolores to call pt  No answer. I have reviewed images from nuclear scan QUestion if some breast attenuation  I am not convinced of ischemia She has SOB   I would recomm cardiac CT to evaluate anterior region of heart  Pt would need BMET prior to evaluate  Keep on current meds

## 2016-08-01 NOTE — Telephone Encounter (Signed)
Pt had nuclear stress test and lab work.      Notes Recorded by Rodman Key, RN on 08/01/2016 at 10:03 AM EDT Informed patient of medication change and repeat lab and bp check.  She will come on 08/28/16. Reviewed recent kidney function results. ------  Notes Recorded by Fay Records, MD on 07/22/2016 at 10:54 PM EDT Cut Hyzaar in 1/2  Continue other meds  Set up for F/U in clinic in 1 month for BP check and BMET   Spoke with patient and informed of results of stress test. She is agreeable to cardiac CT Pt had BMET 07/21/16. Is scheduled for a repeat on 08/28/16. Will clarify with Dr. Harrington Challenger if she needs additional BMET prior to this test.

## 2016-08-02 ENCOUNTER — Encounter: Payer: Self-pay | Admitting: Internal Medicine

## 2016-08-11 ENCOUNTER — Ambulatory Visit (HOSPITAL_COMMUNITY)
Admission: RE | Admit: 2016-08-11 | Discharge: 2016-08-11 | Disposition: A | Discharge status: Home / Self Care | Payer: Medicare Other | Source: Ambulatory Visit | Attending: Internal Medicine | Admitting: Internal Medicine

## 2016-08-11 ENCOUNTER — Encounter (HOSPITAL_COMMUNITY): Payer: Self-pay

## 2016-08-11 DIAGNOSIS — I7090 Unspecified atherosclerosis: Secondary | ICD-10-CM | POA: Diagnosis not present

## 2016-08-11 DIAGNOSIS — R9439 Abnormal result of other cardiovascular function study: Secondary | ICD-10-CM | POA: Insufficient documentation

## 2016-08-11 DIAGNOSIS — I2510 Atherosclerotic heart disease of native coronary artery without angina pectoris: Secondary | ICD-10-CM | POA: Diagnosis not present

## 2016-08-11 DIAGNOSIS — R0602 Shortness of breath: Secondary | ICD-10-CM | POA: Insufficient documentation

## 2016-08-11 MED ORDER — IOPAMIDOL (ISOVUE-370) INJECTION 76%
INTRAVENOUS | Status: AC
  Administered 2016-08-11: 80 mL
  Filled 2016-08-11: qty 100

## 2016-08-11 MED ORDER — NITROGLYCERIN 0.4 MG SL SUBL
SUBLINGUAL_TABLET | SUBLINGUAL | Status: AC
  Filled 2016-08-11: qty 1

## 2016-08-17 ENCOUNTER — Other Ambulatory Visit: Payer: Self-pay

## 2016-08-18 ENCOUNTER — Other Ambulatory Visit: Payer: Self-pay

## 2016-08-23 ENCOUNTER — Other Ambulatory Visit: Payer: Self-pay | Admitting: Endocrinology

## 2016-08-28 ENCOUNTER — Other Ambulatory Visit: Payer: Medicare Other | Admitting: *Deleted

## 2016-08-28 ENCOUNTER — Ambulatory Visit (INDEPENDENT_AMBULATORY_CARE_PROVIDER_SITE_OTHER): Payer: Medicare Other

## 2016-08-28 VITALS — BP 134/60 | HR 54 | Ht 67.5 in | Wt 201.5 lb

## 2016-08-28 DIAGNOSIS — I10 Essential (primary) hypertension: Secondary | ICD-10-CM | POA: Diagnosis not present

## 2016-08-28 DIAGNOSIS — R9439 Abnormal result of other cardiovascular function study: Secondary | ICD-10-CM | POA: Diagnosis not present

## 2016-08-28 LAB — BASIC METABOLIC PANEL
BUN: 26 mg/dL — AB (ref 7–25)
CHLORIDE: 104 mmol/L (ref 98–110)
CO2: 23 mmol/L (ref 20–31)
CREATININE: 1.74 mg/dL — AB (ref 0.60–0.93)
Calcium: 8.6 mg/dL (ref 8.6–10.4)
Glucose, Bld: 177 mg/dL — ABNORMAL HIGH (ref 65–99)
POTASSIUM: 4.3 mmol/L (ref 3.5–5.3)
Sodium: 136 mmol/L (ref 135–146)

## 2016-08-28 NOTE — Progress Notes (Signed)
Patient came into the office for BP check.  The pt currently has a respiratory virus and is taking Mucinex and Tylenol cold as needed for symptoms.  The pt also complains of feeling sick on her stomach like a stomach virus (eating apple sauce, ice chips and ginger ale).  The stomach and respiratory virus started Thursday.  The pt's BP 134/60, pulse 54.  BMP was drawn today while in the office. The pt asked about Cardiac CT results and I made her aware that Dr Harrington Challenger is awaiting further results on this test. At this time the pt will continue current medications and we will contact the pt with any recommendations based on lab results.

## 2016-08-28 NOTE — Patient Instructions (Signed)
BMP pending at this time. Will await results for further changes.

## 2016-09-15 ENCOUNTER — Telehealth: Payer: Self-pay | Admitting: Internal Medicine

## 2016-09-15 NOTE — Telephone Encounter (Signed)
Left message for patient to call back  

## 2016-09-15 NOTE — Telephone Encounter (Signed)
New message  Pt is calling, needs some information  Please call back

## 2016-10-09 ENCOUNTER — Other Ambulatory Visit: Payer: Self-pay | Admitting: *Deleted

## 2016-10-09 DIAGNOSIS — I10 Essential (primary) hypertension: Secondary | ICD-10-CM

## 2016-10-10 ENCOUNTER — Other Ambulatory Visit: Payer: Self-pay | Admitting: Internal Medicine

## 2016-10-16 ENCOUNTER — Telehealth: Payer: Self-pay | Admitting: Endocrinology

## 2016-10-16 MED ORDER — ALBUTEROL SULFATE HFA 108 (90 BASE) MCG/ACT IN AERS: 2.0000 | INHALATION_SPRAY | RESPIRATORY_TRACT | 0 refills | Status: DC | PRN

## 2016-10-16 MED ORDER — BUDESONIDE-FORMOTEROL FUMARATE 160-4.5 MCG/ACT IN AERO: 2.0000 | INHALATION_SPRAY | Freq: Two times a day (BID) | RESPIRATORY_TRACT | 2 refills | Status: DC

## 2016-10-16 NOTE — Telephone Encounter (Signed)
Pt called and stated that she requested her Symbicort to be refilled last week and that she was told Dr. Loanne Drilling denied the script.  She would like to know why this is, because she is in need of the medication.

## 2016-10-16 NOTE — Telephone Encounter (Signed)
Patient called requesting a refill for her Proair. Patient stated she had not seen Dr. Melvyn Novas in a couple of years and wanted to know if we could refill this for her? Please advise, Thanks!

## 2016-10-16 NOTE — Telephone Encounter (Signed)
Refill submitted and patient notified of message.

## 2016-10-16 NOTE — Telephone Encounter (Signed)
Please refill x 1 pulm is due

## 2016-10-21 ENCOUNTER — Other Ambulatory Visit: Payer: Self-pay | Admitting: Endocrinology

## 2016-10-24 ENCOUNTER — Other Ambulatory Visit: Payer: Medicare Other | Admitting: *Deleted

## 2016-10-24 DIAGNOSIS — I10 Essential (primary) hypertension: Secondary | ICD-10-CM | POA: Diagnosis not present

## 2016-10-25 LAB — BASIC METABOLIC PANEL
BUN/Creatinine Ratio: 12 (ref 12–28)
BUN: 16 mg/dL (ref 8–27)
CALCIUM: 8.6 mg/dL — AB (ref 8.7–10.3)
CO2: 21 mmol/L (ref 18–29)
CREATININE: 1.35 mg/dL — AB (ref 0.57–1.00)
Chloride: 104 mmol/L (ref 96–106)
GFR calc Af Amer: 43 mL/min/{1.73_m2} — ABNORMAL LOW (ref >59)
GFR calc non Af Amer: 38 mL/min/{1.73_m2} — ABNORMAL LOW (ref >59)
GLUCOSE: 149 mg/dL — AB (ref 65–99)
Potassium: 3.9 mmol/L (ref 3.5–5.2)
Sodium: 144 mmol/L (ref 134–144)

## 2016-11-09 ENCOUNTER — Encounter: Payer: Self-pay | Admitting: Endocrinology

## 2016-11-09 ENCOUNTER — Ambulatory Visit (INDEPENDENT_AMBULATORY_CARE_PROVIDER_SITE_OTHER): Payer: Medicare Other | Admitting: Endocrinology

## 2016-11-09 VITALS — BP 146/90 | Temp 98.5°F | Wt 189.9 lb

## 2016-11-09 DIAGNOSIS — R112 Nausea with vomiting, unspecified: Secondary | ICD-10-CM

## 2016-11-09 LAB — CBC WITH DIFFERENTIAL/PLATELET
BASOS PCT: 0.6 % (ref 0.0–3.0)
Basophils Absolute: 0.1 10*3/uL (ref 0.0–0.1)
EOS ABS: 0 10*3/uL (ref 0.0–0.7)
Eosinophils Relative: 0.2 % (ref 0.0–5.0)
HCT: 37.1 % (ref 36.0–46.0)
HEMOGLOBIN: 12.3 g/dL (ref 12.0–15.0)
Lymphocytes Relative: 3.9 % — ABNORMAL LOW (ref 12.0–46.0)
Lymphs Abs: 0.4 10*3/uL — ABNORMAL LOW (ref 0.7–4.0)
MCHC: 33.2 g/dL (ref 30.0–36.0)
MCV: 83.5 fl (ref 78.0–100.0)
MONO ABS: 0.8 10*3/uL (ref 0.1–1.0)
Monocytes Relative: 8.2 % (ref 3.0–12.0)
Neutro Abs: 8.6 10*3/uL — ABNORMAL HIGH (ref 1.4–7.7)
Neutrophils Relative %: 87.1 % — ABNORMAL HIGH (ref 43.0–77.0)
PLATELETS: 187 10*3/uL (ref 150.0–400.0)
RBC: 4.45 Mil/uL (ref 3.87–5.11)
RDW: 17.7 % — AB (ref 11.5–15.5)
WBC: 9.9 10*3/uL (ref 4.0–10.5)

## 2016-11-09 LAB — URINALYSIS, ROUTINE W REFLEX MICROSCOPIC
Bilirubin Urine: NEGATIVE
Ketones, ur: NEGATIVE
Leukocytes, UA: NEGATIVE
Nitrite: NEGATIVE
SPECIFIC GRAVITY, URINE: 1.01 (ref 1.000–1.030)
TOTAL PROTEIN, URINE-UPE24: 100 — AB
URINE GLUCOSE: NEGATIVE
UROBILINOGEN UA: 0.2 (ref 0.0–1.0)
pH: 6 (ref 5.0–8.0)

## 2016-11-09 LAB — BASIC METABOLIC PANEL
BUN: 19 mg/dL (ref 6–23)
CALCIUM: 9.5 mg/dL (ref 8.4–10.5)
CHLORIDE: 108 meq/L (ref 96–112)
CO2: 24 mEq/L (ref 19–32)
CREATININE: 1.45 mg/dL — AB (ref 0.40–1.20)
GFR: 44.85 mL/min — ABNORMAL LOW (ref >60.00)
Glucose, Bld: 186 mg/dL — ABNORMAL HIGH (ref 70–99)
Potassium: 4.1 mEq/L (ref 3.5–5.1)
Sodium: 142 mEq/L (ref 135–145)

## 2016-11-09 LAB — HEPATIC FUNCTION PANEL
ALBUMIN: 4.1 g/dL (ref 3.5–5.2)
ALK PHOS: 63 U/L (ref 39–117)
ALT: 13 U/L (ref 0–35)
AST: 15 U/L (ref 0–37)
Bilirubin, Direct: 0.2 mg/dL (ref 0.0–0.3)
TOTAL PROTEIN: 7.9 g/dL (ref 6.0–8.3)
Total Bilirubin: 0.9 mg/dL (ref 0.2–1.2)

## 2016-11-09 LAB — AMYLASE: Amylase: 59 U/L (ref 27–131)

## 2016-11-09 MED ORDER — PROMETHAZINE-DM 6.25-15 MG/5ML PO SYRP: 5.0000 mL | ORAL_SOLUTION | Freq: Four times a day (QID) | ORAL | 1 refills | Status: DC | PRN

## 2016-11-09 MED ORDER — ONDANSETRON HCL 4 MG PO TABS: 4.0000 mg | ORAL_TABLET | Freq: Four times a day (QID) | ORAL | 1 refills | Status: DC | PRN

## 2016-11-09 NOTE — Progress Notes (Signed)
Pre visit review using our clinic review tool, if applicable. No additional management support is needed unless otherwise documented below in the visit note. 

## 2016-11-09 NOTE — Patient Instructions (Addendum)
Blood and urine tests are requested for you today.  We'll let you know about the results. I have sent prescriptions to your pharmacy, for an anti-nausea medication, and cough.  The cough syrup helps the nausea, so don't take both together.  I hope you feel better soon.  If you don't feel better by next week, please call back.  Please call sooner if you get worse. While you are sick, take extra humalog: 5 extra for any blood sugar over 300.

## 2016-11-09 NOTE — Progress Notes (Signed)
Subjective:    Patient ID: [name], female    DOB: January 23, 1936, 81 y.o.   MRN: [id number]  HPI  Pt returns for f/u of diabetes mellitus: DM type: Insulin-requiring type 2 Dx'ed: 6270 Complications: renal insuff Therapy: insulin since 2008 GDM: never DKA: never Severe hypoglycemia: never Pancreatitis: never Other: she takes multiple daily injections.   Interval history: Since she has been ill, cbg's have been in the 300's.  Pt states few days of slight left flank pain, and assoc n/v/d.   Past Medical History:  Diagnosis Date  . Allergic rhinitis   . Anterior chest wall pain   . Anxiety   . Asthma   . Cough   . DM type 2 (diabetes mellitus, type 2) ([place])   . GERD (gastroesophageal reflux disease)   . History of diverticulitis of colon   . HTN (hypertension)   . Hyperlipidemia   . IBS (irritable bowel syndrome)   . Iron deficiency anemia   . Osteoporosis, unspecified   . UTI (urinary tract infection)     Past Surgical History:  Procedure Laterality Date  . CARDIOVASCULAR STRESS TEST  02/25/04  . ESOPHAGOGASTRODUODENOSCOPY  12/26/01  . RIGHT OOPHORECTOMY  jan 2010    Social History   Social History  . Marital status: Widowed    Spouse name: N/A  . Number of children: N/A  . Years of education: N/A   Occupational History  . retired    Social History Main Topics  . Smoking status: Former Smoker    Packs/day: 0.30    Years: 5.00    Types: Cigarettes    Quit date: 10/02/1986  . Smokeless tobacco: Never Used  . Alcohol use No  . Drug use: No  . Sexual activity: Not on file   Other Topics Concern  . Not on file   Social History Narrative  . No narrative on file    Current Outpatient Prescriptions on File Prior to Visit  Medication Sig Dispense Refill  . acetaminophen (TYLENOL) 325 MG tablet Take 650 mg by mouth every 6 (six) hours as needed for pain.    Marland Kitchen albuterol (PROAIR HFA) 108 (90 Base) MCG/ACT inhaler Inhale 2 puffs into the lungs every 4 (four)  hours as needed for wheezing. 1 Inhaler 0  . amLODipine (NORVASC) 5 MG tablet Take 1 tablet (5 mg total) by mouth daily. 90 tablet 3  . Blood Glucose Monitoring Suppl (ONE TOUCH ULTRA 2) W/DEVICE KIT Check blood sugar 3 times per day. 1 each 0  . budesonide-formoterol (SYMBICORT) 160-4.5 MCG/ACT inhaler Inhale 2 puffs into the lungs 2 (two) times daily. 1 Inhaler 2  . ELIQUIS 5 MG TABS tablet TAKE 1 TABLET (5 MG TOTAL) BY MOUTH 2 (TWO) TIMES DAILY. 60 tablet 11  . insulin lispro (HUMALOG) 100 UNIT/ML injection Inject 10 units into the skin before breakfast and dinner. Inject 5 units into the skin before lunch.    . Insulin Syringe-Needle U-100 (BD INSULIN SYRINGE ULTRAFINE) 31G X 5/16" 0.3 ML MISC Use as directed     . losartan-hydrochlorothiazide (HYZAAR) 100-12.5 MG tablet Take 0.5 tablets by mouth daily.    . montelukast (SINGULAIR) 10 MG tablet TAKE 1 TABLET BY MOUTH AT BEDTIME 90 tablet 0  . ONE TOUCH LANCETS MISC Use as directed three times daily     . potassium chloride (K-DUR) 10 MEQ tablet Take 1 tablet (10 mEq total) by mouth daily. 30 tablet 11  . pravastatin (PRAVACHOL) 40 MG tablet TAKE  1 TABLET BY MOUTH DAILY 90 tablet 3  . terbinafine (LAMISIL) 250 MG tablet Take 1 tablet (250 mg total) by mouth daily. 90 tablet 0   No current facility-administered medications on file prior to visit.     Allergies  Allergen Reactions  . Aspirin   . Ramipril     REACTION: Cough    Family History  Problem Relation Age of Onset  . Lung cancer Brother   . Melanoma Brother   . Hypertension Mother   . Stroke Mother   . Other Father     poor circulation  . Atopy Neg Hx   . Asthma Neg Hx     BP (!) 146/90 (BP Location: Left Arm, Patient Position: Sitting, Cuff Size: Normal)   Temp 98.5 F (36.9 C) (Oral)   Wt 189 lb 14.4 oz (86.1 kg)   BMI 29.30 kg/m   Review of Systems Denies BRBPR, hematuria, fever, and hematuria.  She has a slight cough.      Objective:   Physical Exam VITAL  SIGNS:  See vs page GENERAL: no distress ABDOMEN: abdomen is soft, nontender.  no hepatosplenomegaly.  not distended.  no hernia      Assessment & Plan:  AGE, new Insulin-requiring type 2 DM: worse, prob due to acute illness.  Patient is advised the following: Patient Instructions  Blood and urine tests are requested for you today.  We'll let you know about the results. I have sent prescriptions to your pharmacy, for an anti-nausea medication, and cough.  The cough syrup helps the nausea, so don't take both together.  I hope you feel better soon.  If you don't feel better by next week, please call back.  Please call sooner if you get worse. While you are sick, take extra humalog: 5 extra for any blood sugar over 300.

## 2017-01-15 ENCOUNTER — Other Ambulatory Visit: Payer: Self-pay | Admitting: Endocrinology

## 2017-01-19 ENCOUNTER — Other Ambulatory Visit: Payer: Self-pay | Admitting: Endocrinology

## 2017-01-25 ENCOUNTER — Encounter: Payer: Self-pay | Admitting: Internal Medicine

## 2017-01-25 ENCOUNTER — Ambulatory Visit (INDEPENDENT_AMBULATORY_CARE_PROVIDER_SITE_OTHER): Payer: Medicare Other | Admitting: Internal Medicine

## 2017-01-25 VITALS — BP 150/74 | HR 60 | Ht 65.25 in | Wt 193.8 lb

## 2017-01-25 DIAGNOSIS — I10 Essential (primary) hypertension: Secondary | ICD-10-CM

## 2017-01-25 DIAGNOSIS — J45991 Cough variant asthma: Secondary | ICD-10-CM | POA: Diagnosis not present

## 2017-01-25 DIAGNOSIS — J302 Other seasonal allergic rhinitis: Secondary | ICD-10-CM

## 2017-01-25 LAB — NITRIC OXIDE: NITRIC OXIDE: 22

## 2017-01-25 MED ORDER — ALBUTEROL SULFATE HFA 108 (90 BASE) MCG/ACT IN AERS: 2.0000 | INHALATION_SPRAY | RESPIRATORY_TRACT | 1 refills | Status: DC | PRN

## 2017-01-25 MED ORDER — BUDESONIDE-FORMOTEROL FUMARATE 160-4.5 MCG/ACT IN AERO: 2.0000 | INHALATION_SPRAY | Freq: Two times a day (BID) | RESPIRATORY_TRACT | 11 refills | Status: DC

## 2017-01-25 NOTE — Assessment & Plan Note (Addendum)
-  PFT's 03/18/08 minimal airflow obstruction  -PFT's January 04, 2010 No sign airflow obstruction g  - FENO 01/25/2017  =   25 p am symb 160 x 2 pffs - Spirometry 01/25/2017  wnl x for min curvature on f/v p am symb 160 x 2 / no saba prior   - 01/25/2017  After extensive coaching HFA effectiveness =    75% from baseline 50%    Despite suboptimal techique and mild seasonal rhinitis flare, All goals of chronic asthma control met including optimal function and elimination of symptoms with minimal need for rescue therapy.  Contingencies discussed in full including contacting this office immediately if not controlling the symptoms using the rule of two's.     I had an extended discussion with the patient reviewing all relevant studies completed to date and  lasting 25 minutes of a 40  minute office  visit to re-establish  re    non-specific but potentially very serious   respiratory symptoms of uncertain and potentially multiple  etiologies.   Each maintenance medication was reviewed in detail including most importantly the difference between maintenance and prns and under what circumstances the prns are to be triggered using an action plan format that is not reflected in the computer generated alphabetically organized AVS.    Please see AVS for specific instructions unique to this office visit that I personally wrote and verbalized to the the pt in detail and then reviewed with pt  by my nurse highlighting any changes in therapy/plan of care  recommended at today's visit.       rec return q 6 m with all meds in hand using a trust but verify approach to confirm accurate Medication  Reconciliation The principal here is that until we are certain that the  patients are doing what we've asked, it makes no sense to ask them to do more.

## 2017-01-25 NOTE — Progress Notes (Signed)
Subjective:     Patient ID: [name], female   DOB: 12-Apr-1936, 81 y.o.   MRN: [id number]  HPI  26  yobf quit smoking 1988 with h/o asthma  And nl baseline pfts ( as of 01/04/2010) who had been using Advair on a p.r.n. basis and noticing increasing dyspnea and need for rescue therapy x one mo when seen 01/06/08 for pulmonary evaluation  so Advair stopped. Began Symbicort 160/4.16mcg 2 puffs two times a day.  Returned 02/13/08 improved with decreased dyspnea and no rescue use   Returned 03/18/08 symptom free no rescue needed, stopped reglan, no flare  November 23, 2009 Followup. Pt states that she had PNA in Jan 2011 and since then has had increased SOB. She states that SOB is only with exertion. She gets SOB walking long distances. She also c/o dry cough x several months. not compliant with symbicort. rec symbicort 160 and work on technique   January 04, 2010 cc her breathing has improved since last seen. She still has occ SOB with exertion "if walks a long way". She c/o "hacking cough" that she relates to having allergies tends to be worse in spring day > night mild itching sneezing runny nose    rec 1) Try lower strength symbicort 80 2 puffs first thing in am and 2 puffs again in pm about 12 hours later and fill the prescription if do as well (since irritates upper airway less at the lower strength 2) If your breathing worsens or you need to use your rescue inhaler (ventolin or nebulized albuterol) more than twice weekly or wake up more than twice a month with any respiratory symptoms or require more than two rescue inhalers per year, we need to see you right away.  3) for allergies try zyrtec or chlortrimeton otc as needed instead of benadryl  03/22/11 ov/Kenlee Vogt cc cough and sob good control on lower dose symbicort, only having problems when get out in heat rec No change rx  10/16/2013 f/u ov/Ryleeann Urquiza re: asthma Chief Complaint  Patient presents with  . Follow-up    Pt c/o increased DOE x 2 wks. She  states "I don't walk too far before I get SOB".  She has also noticed wheezing at night for the past several wks. She is using rescue inhaler on average twice per day.    rec Prednisone 10 mg take  4 each am x 2 days,   2 each am x 2 days,  1 each am x 2 days and stop (called in) If your breathing worsens or you need to use your rescue inhaler more than twice weekly or wake up more than twice a month with any respiratory symptoms or require more than two rescue inhalers per year, we need to see you right away because this means we're not controlling the underlying problem (inflammation) adequately. Rescue inhalers do not control inflammation and overuse can lead to unnecessary and costly consequences.  They can make you feel better temporarily but eventually they will quit working effectively much as sleep aids lead to more insomnia if used regularly.  In the event of any resp flare Cough use mucinex dm up to 1200 mg  Twice daily Breathing use proair up to every 4h Always start Prilosec 20 mg Take 30-60 min before first meal of the day and Pepcid 20 mg one at bedtime until no longer flaring   01/25/2017  f/u ov/Lawanna Cecere re: asthma / ? Allergic rhinitis  rx symb160/singulair  Chief Complaint  Patient presents with  . Follow-up    Last seen 2015. She c/o minimal wheezing for the past wk "b/c of the pollen". She also has minimal dry cough.   She is using proair 2 x per wk on average.   only uses alb if overdoes it - never noct needed  Doe = MMRC2 = can't walk a nl pace on a flat grade s sob but does fine slow and flat eg shopping  No obvious day to day or daytime variability or assoc excess/ purulent sputum or mucus plugs or hemoptysis or cp or chest tightness, subjective wheeze or overt sinus or hb symptoms. No unusual exp hx or h/o childhood pna/ asthma or knowledge of premature birth.  Sleeping ok without nocturnal  or early am exacerbation  of respiratory  c/o's or need for noct saba. Also denies  any obvious fluctuation of symptoms with weather or environmental changes or other aggravating or alleviating factors except as outlined above   Current Medications, Allergies, Complete Past Medical History, Past Surgical History, Family History, and Social History were reviewed in [employer] record.  ROS  The following are not active complaints unless bolded sore throat, dysphagia, dental problems, itching, sneezing,  nasal congestion or excess/ purulent secretions, ear ache,   fever, chills, sweats, unintended wt loss, classically pleuritic or exertional cp,  orthopnea pnd or leg swelling, presyncope, palpitations, abdominal pain, anorexia, nausea, vomiting, diarrhea  or change in bowel or bladder habits, change in stools or urine, dysuria,hematuria,  rash, arthralgias, visual complaints, headache, numbness, weakness or ataxia or problems with walking or coordination,  change in mood/affect or memory.                Allergies  1) ! * Altace  2) ! * Asprin     Past Medical History:   OSTEOPOROSIS (ICD-733.00)  HYPERTENSION (ICD-401.9)  HYPERLIPIDEMIA (ICD-272.4)  DIVERTICULITIS, HX OF (ICD-V12.79)  DIABETES MELLITUS, TYPE II (ICD-250.00)  COLON CANCER, HX OF (ICD-V10.05)  ASTHMA (ICD-493.90)  -PFT's 03/18/08 minimal airflow obstruction  -PFT's January 04, 2010 No sign airflow obstruction  -Mastered HFA technique August 13, 2008 > confimed November 23, 2009  ANXIETY (ICD-300.00)  ANEMIA-IRON DEFICIENCY (ICD-280.9)  ALLERGIC RHINITIS (ICD-477.9)  IBS  GERD            Objective:   Physical Exam   wt 202 November 23, 2009> 199 January 04, 2010 > 209  03/22/11 > 214 10/16/2013 >  01/25/2017   193  Vital signs reviewed  - Note on arrival 02 sats  96% on RA with bp  150/74 noted p am meds  Pt alert, approp amb obese bf nad  No jvd Oropharanx clear/ top dentures  Neck supple Lungs with mild pseudo wheeze resolves completely with plm RRR no s3 or or sign  murmur Abd obese with excursion >> Extr wam with no edema or clubbing noted Neuro  No motor deficits       Assessment:

## 2017-01-25 NOTE — Patient Instructions (Addendum)
For itching/ seezing runny nose take zyrtec 10 mg daily (typically use at bedtime as may make you slightly sleepy but works for 24 hours)   Work on inhaler technique:  relax and gently blow all the way out then take a nice smooth deep breath back in, triggering the inhaler at same time you start breathing in.  Hold for up to 5 seconds if you can. Blow out thru nose. Rinse and gargle with water when done    Plan A = Automatic = symbicort 160 Take 2 puffs first thing in am and then another 2 puffs about 12 hours later.      Plan B = Backup Only use your albuterol as a rescue medication to be used if you can't catch your breath by resting or doing a relaxed purse lip breathing pattern.  - The less you use it, the better it will work when you need it. - Ok to use the inhaler up to 2 puffs  every 4 hours if you must but call for appointment if use goes up over your usual need - Don't leave home without it !!  (think of it like the spare tire for your car)      Please schedule a follow up visit in 6 months but call sooner if needed  with all medications /inhalers/ solutions in hand so we can verify exactly what you are taking. This includes all medications from all doctors and over the counters

## 2017-01-25 NOTE — Assessment & Plan Note (Signed)
Body mass index is 32 kg/m.  -  trending down, encouraged Lab Results  Component Value Date   TSH 1.37 04/12/2016     Contributing to gerd risk/ doe/reviewed the need and the process to achieve and maintain neg calorie balance > defer f/u primary care including intermittently monitoring thyroid status

## 2017-01-25 NOTE — Assessment & Plan Note (Signed)
Continue singulair/ Add zyrtec prn/ allergy testing if not responding

## 2017-01-25 NOTE — Assessment & Plan Note (Signed)
Not ideal > Follow up per Primary Care planned

## 2017-04-17 ENCOUNTER — Other Ambulatory Visit: Payer: Self-pay | Admitting: Endocrinology

## 2017-05-02 ENCOUNTER — Telehealth: Payer: Self-pay | Admitting: Endocrinology

## 2017-05-02 ENCOUNTER — Encounter: Payer: Self-pay | Admitting: Endocrinology

## 2017-05-02 ENCOUNTER — Ambulatory Visit (INDEPENDENT_AMBULATORY_CARE_PROVIDER_SITE_OTHER): Payer: Medicare Other | Admitting: Endocrinology

## 2017-05-02 VITALS — BP 145/80 | HR 53 | Ht 65.25 in | Wt 195.8 lb

## 2017-05-02 DIAGNOSIS — R252 Cramp and spasm: Secondary | ICD-10-CM | POA: Diagnosis not present

## 2017-05-02 DIAGNOSIS — M10072 Idiopathic gout, left ankle and foot: Secondary | ICD-10-CM | POA: Diagnosis not present

## 2017-05-02 DIAGNOSIS — I10 Essential (primary) hypertension: Secondary | ICD-10-CM

## 2017-05-02 LAB — COMPREHENSIVE METABOLIC PANEL
ALT: 12 U/L (ref 0–35)
AST: 14 U/L (ref 0–37)
Albumin: 3.9 g/dL (ref 3.5–5.2)
Alkaline Phosphatase: 76 U/L (ref 39–117)
BILIRUBIN TOTAL: 0.7 mg/dL (ref 0.2–1.2)
BUN: 23 mg/dL (ref 6–23)
CALCIUM: 9.1 mg/dL (ref 8.4–10.5)
CO2: 26 meq/L (ref 19–32)
Chloride: 105 mEq/L (ref 96–112)
Creatinine, Ser: 1.36 mg/dL — ABNORMAL HIGH (ref 0.40–1.20)
GFR: 48.24 mL/min — AB (ref >60.00)
Glucose, Bld: 225 mg/dL — ABNORMAL HIGH (ref 70–99)
Potassium: 3.9 mEq/L (ref 3.5–5.1)
Sodium: 136 mEq/L (ref 135–145)
Total Protein: 7.2 g/dL (ref 6.0–8.3)

## 2017-05-02 LAB — MAGNESIUM: MAGNESIUM: 1.4 mg/dL — AB (ref 1.5–2.5)

## 2017-05-02 LAB — URIC ACID: URIC ACID, SERUM: 8.1 mg/dL — AB (ref 2.4–7.0)

## 2017-05-02 NOTE — Telephone Encounter (Signed)
New Message  Pt voiced she has swelling in her ankles and when she walks with her kane it hurts and pt wants to know if she can have a sooner appt than first available 8.28.18.  Pt voiced also wants to know since that is too long if she can be seen by another provider.  Please f/u with pt

## 2017-05-02 NOTE — Progress Notes (Signed)
Subjective:     Patient ID: [name], female   DOB: 29-Dec-1935, 81 y.o.   MRN: [id number]   CHIEF complaint: Left ankle pain  HPI  About a week ago she started having muscle cramps more frequent than usual and more significant She says that she has had long history of muscle cramps intermittently, previously used to take quinine She is also on potassium supplements and last potassium level was normal  Last week she started having pain in her left ankle in the front of the joint and the joint also was swollen and she had difficulty putting her shoe on an walking.  Has been using a cane.  She says she was trying to walk more to relieve her cramps  She did not take any treatment except Tylenol for the joint pain and the swelling and pain is improving but the swelling has not gone away and she is concerned about this  She does not think she has a previous history of gout.  HYPERTENSION: She has not taken her medication today, normally is taking amlodipine and Hyzaar   Active Ambulatory Problems    Diagnosis Date Noted  . Dyslipidemia 05/27/2007  . ANEMIA-IRON DEFICIENCY 05/27/2007  . ANXIETY 05/27/2007  . Essential hypertension 05/27/2007  . HEMORRHOIDS, RECURRENT 01/27/2008  . Seasonal allergic rhinitis 05/27/2007  . Cough variant asthma 05/27/2007  . GERD 01/13/2008  . DIVERTICULOSIS OF COLON 01/27/2008  . IRRITABLE BOWEL SYNDROME 05/07/2009  . UTI 09/28/2007  . Osteoporosis 05/27/2007  . CHEST PAIN 05/07/2009  . ADNEXAL MASS, RIGHT 07/22/2008  . Nonspecific (abnormal) findings on radiological and other examination of body structure 11/09/2009  . OTH ABNORMAL FIND RAD EXAMINATION BREAST 01/27/2008  . DIVERTICULITIS, HX OF 05/27/2007  . Encounter for long-term (current) use of other medications 07/06/2011  . Routine general medical examination at a health care facility 07/17/2011  . Coronary atherosclerosis of native coronary artery 09/01/2013  . Vomiting 01/29/2014  .  Atrial fibrillation ([place]) 05/28/2014  . Personal history of colonic polyps 05/28/2014  . Cough 01/20/2015  . Diabetes ([place]) 04/12/2016  . Morbid obesity due to excess calories ([place]) 01/25/2017   Resolved Ambulatory Problems    Diagnosis Date Noted  . Cough 11/22/2007  . [place] LUNG FIELD 11/09/2009   Past Medical History:  Diagnosis Date  . Allergic rhinitis   . Anterior chest wall pain   . Anxiety   . Asthma   . Cough   . DM type 2 (diabetes mellitus, type 2) ([place])   . GERD (gastroesophageal reflux disease)   . History of diverticulitis of colon   . HTN (hypertension)   . Hyperlipidemia   . IBS (irritable bowel syndrome)   . Iron deficiency anemia   . Osteoporosis, unspecified   . UTI (urinary tract infection)     Allergies as of 05/02/2017      Reactions   Aspirin    Ramipril    REACTION: Cough      Medication List       Accurate as of 05/02/17  3:53 PM. Always use your most recent med list.          acetaminophen 325 MG tablet Commonly known as:  TYLENOL Take 650 mg by mouth every 6 (six) hours as needed for pain.   albuterol 108 (90 Base) MCG/ACT inhaler Commonly known as:  PROAIR HFA Inhale 2 puffs into the lungs every 4 (four) hours as needed for wheezing.  amLODipine 5 MG tablet Commonly known as:  NORVASC Take 1 tablet (5 mg total) by mouth daily.   BD INSULIN SYRINGE ULTRAFINE 31G X 5/16" 0.3 ML Misc Generic drug:  Insulin Syringe-Needle U-100 Use as directed   budesonide-formoterol 160-4.5 MCG/ACT inhaler Commonly known as:  SYMBICORT Inhale 2 puffs into the lungs 2 (two) times daily.   ELIQUIS 5 MG Tabs tablet Generic drug:  apixaban TAKE 1 TABLET (5 MG TOTAL) BY MOUTH 2 (TWO) TIMES DAILY.   HUMALOG 100 UNIT/ML injection Generic drug:  insulin lispro Inject 10 units into the skin before breakfast and dinner. Inject 5 units into the skin before lunch.   HUMALOG 100 UNIT/ML injection Generic drug:  insulin  lispro 3 TIMES A DAY (JUST BEFORE EACH MEAL) 07-06-09 UNITS, AND SYRINGES 3/DAY   losartan-hydrochlorothiazide 100-12.5 MG tablet Commonly known as:  HYZAAR Take 0.5 tablets by mouth daily.   montelukast 10 MG tablet Commonly known as:  SINGULAIR TAKE 1 TABLET BY MOUTH AT BEDTIME   ondansetron 4 MG tablet Commonly known as:  ZOFRAN Take 1 tablet (4 mg total) by mouth every 6 (six) hours as needed for nausea or vomiting.   ONE TOUCH LANCETS Misc Use as directed three times daily   ONE TOUCH ULTRA 2 w/Device Kit Check blood sugar 3 times per day.   potassium chloride 10 MEQ tablet Commonly known as:  K-DUR Take 1 tablet (10 mEq total) by mouth daily.   pravastatin 40 MG tablet Commonly known as:  PRAVACHOL TAKE 1 TABLET BY MOUTH DAILY        Review of Systems     Objective:   Physical Exam  Musculoskeletal: She exhibits no tenderness.  She has mild swelling of the left foot.  Has mild tenderness of the proximal middle dorsum of the foot.  No warmth of the ankle or surrounding area.  Skin is normal in appearance including plantar surfaces.  Normal monofilament sensation in the toes and normal pulses   has no pain in the ankle joint on range of movement   BP (!) 150/92   Pulse (!) 53   Ht 5' 5.25" (1.657 m)   Wt 195 lb 12.8 oz (88.8 kg)   SpO2 98%   BMI 32.33 kg/m      Assessment:     Possible acute gouty arthritis of the left ankle. She appears to have had resolution of the pain and has mild residual swelling of the foot She does have mild tenderness of the dorsum of the foot but no tenderness of the other parts  or ankle No calf tenderness   Plan:     Check for uric acid, chemistry panel and magnesium level to evaluate for hyperuricemia, electrolyte disorders and hypomagnesemia to evaluate her symptoms of cramps also Since she is not very symptomatic with discomfort no treatment is necessary at this time She can elevate her foot  Reminder to take her  blood pressure medication on time regularly Follow-up with PCP as scheduled      [facility] 05/02/17   ADDENDUM: Patient has a high uric acid and glucose and low magnesium, see results note

## 2017-05-03 NOTE — Telephone Encounter (Signed)
She was seen by Dr. Dwyane Dee yesterday and made follow up with Dr. Loanne Drilling thank you!

## 2017-05-03 NOTE — Telephone Encounter (Signed)
She needs primary care and I am only doing endo. Maybe Dr. Dwyane Dee or another PCP within [facility] can see her sooner.Marland KitchenMarland Kitchen

## 2017-05-07 ENCOUNTER — Other Ambulatory Visit: Payer: Self-pay | Admitting: Endocrinology

## 2017-06-01 ENCOUNTER — Encounter: Payer: Self-pay | Admitting: Endocrinology

## 2017-06-01 ENCOUNTER — Ambulatory Visit (INDEPENDENT_AMBULATORY_CARE_PROVIDER_SITE_OTHER): Payer: Medicare Other | Admitting: Endocrinology

## 2017-06-01 VITALS — BP 130/68 | HR 53 | Wt 196.0 lb

## 2017-06-01 DIAGNOSIS — E1122 Type 2 diabetes mellitus with diabetic chronic kidney disease: Secondary | ICD-10-CM

## 2017-06-01 DIAGNOSIS — E785 Hyperlipidemia, unspecified: Secondary | ICD-10-CM

## 2017-06-01 DIAGNOSIS — M810 Age-related osteoporosis without current pathological fracture: Secondary | ICD-10-CM

## 2017-06-01 DIAGNOSIS — N183 Chronic kidney disease, stage 3 (moderate): Secondary | ICD-10-CM | POA: Diagnosis not present

## 2017-06-01 DIAGNOSIS — M25561 Pain in right knee: Secondary | ICD-10-CM | POA: Diagnosis not present

## 2017-06-01 DIAGNOSIS — Z794 Long term (current) use of insulin: Secondary | ICD-10-CM | POA: Diagnosis not present

## 2017-06-01 DIAGNOSIS — I4891 Unspecified atrial fibrillation: Secondary | ICD-10-CM | POA: Diagnosis not present

## 2017-06-01 DIAGNOSIS — D509 Iron deficiency anemia, unspecified: Secondary | ICD-10-CM | POA: Diagnosis not present

## 2017-06-01 DIAGNOSIS — M25569 Pain in unspecified knee: Secondary | ICD-10-CM | POA: Insufficient documentation

## 2017-06-01 LAB — URINALYSIS, ROUTINE W REFLEX MICROSCOPIC
BILIRUBIN URINE: NEGATIVE
HGB URINE DIPSTICK: NEGATIVE
KETONES UR: NEGATIVE
LEUKOCYTES UA: NEGATIVE
NITRITE: NEGATIVE
Specific Gravity, Urine: 1.01 (ref 1.000–1.030)
Total Protein, Urine: 30 — AB
UROBILINOGEN UA: 0.2 (ref 0.0–1.0)
Urine Glucose: NEGATIVE
pH: 6 (ref 5.0–8.0)

## 2017-06-01 LAB — CBC WITH DIFFERENTIAL/PLATELET
BASOS ABS: 0.1 10*3/uL (ref 0.0–0.1)
Basophils Relative: 0.9 % (ref 0.0–3.0)
Eosinophils Absolute: 0.4 10*3/uL (ref 0.0–0.7)
Eosinophils Relative: 5.3 % — ABNORMAL HIGH (ref 0.0–5.0)
HEMATOCRIT: 36.1 % (ref 36.0–46.0)
Hemoglobin: 12 g/dL (ref 12.0–15.0)
LYMPHS PCT: 26.4 % (ref 12.0–46.0)
Lymphs Abs: 1.7 10*3/uL (ref 0.7–4.0)
MCHC: 33.3 g/dL (ref 30.0–36.0)
MCV: 81.6 fl (ref 78.0–100.0)
MONOS PCT: 10 % (ref 3.0–12.0)
Monocytes Absolute: 0.7 10*3/uL (ref 0.1–1.0)
NEUTROS PCT: 57.4 % (ref 43.0–77.0)
Neutro Abs: 3.8 10*3/uL (ref 1.4–7.7)
Platelets: 175 10*3/uL (ref 150.0–400.0)
RBC: 4.42 Mil/uL (ref 3.87–5.11)
RDW: 16.7 % — ABNORMAL HIGH (ref 11.5–15.5)
WBC: 6.6 10*3/uL (ref 4.0–10.5)

## 2017-06-01 LAB — LIPID PANEL
Cholesterol: 129 mg/dL (ref 0–200)
HDL: 49.3 mg/dL (ref >39.00)
LDL Cholesterol: 64 mg/dL (ref 0–99)
NONHDL: 79.76
Total CHOL/HDL Ratio: 3
Triglycerides: 77 mg/dL (ref 0.0–149.0)
VLDL: 15.4 mg/dL (ref 0.0–40.0)

## 2017-06-01 LAB — BASIC METABOLIC PANEL
BUN: 22 mg/dL (ref 6–23)
CHLORIDE: 105 meq/L (ref 96–112)
CO2: 24 meq/L (ref 19–32)
CREATININE: 1.53 mg/dL — AB (ref 0.40–1.20)
Calcium: 9.5 mg/dL (ref 8.4–10.5)
GFR: 42.1 mL/min — ABNORMAL LOW (ref >60.00)
Glucose, Bld: 188 mg/dL — ABNORMAL HIGH (ref 70–99)
POTASSIUM: 3.9 meq/L (ref 3.5–5.1)
Sodium: 139 mEq/L (ref 135–145)

## 2017-06-01 LAB — IBC PANEL
Iron: 56 ug/dL (ref 42–145)
Saturation Ratios: 17 % — ABNORMAL LOW (ref 20.0–50.0)
TRANSFERRIN: 235 mg/dL (ref 212.0–360.0)

## 2017-06-01 LAB — HEPATIC FUNCTION PANEL
ALBUMIN: 3.9 g/dL (ref 3.5–5.2)
ALT: 12 U/L (ref 0–35)
AST: 14 U/L (ref 0–37)
Alkaline Phosphatase: 76 U/L (ref 39–117)
BILIRUBIN TOTAL: 0.9 mg/dL (ref 0.2–1.2)
Bilirubin, Direct: 0.3 mg/dL (ref 0.0–0.3)
TOTAL PROTEIN: 7.4 g/dL (ref 6.0–8.3)

## 2017-06-01 LAB — VITAMIN D 25 HYDROXY (VIT D DEFICIENCY, FRACTURES): VITD: 16.91 ng/mL — ABNORMAL LOW (ref 30.00–100.00)

## 2017-06-01 LAB — POCT GLYCOSYLATED HEMOGLOBIN (HGB A1C): Hemoglobin A1C: 6.3

## 2017-06-01 LAB — TSH: TSH: 3.22 u[IU]/mL (ref 0.35–4.50)

## 2017-06-01 MED ORDER — INSULIN LISPRO 100 UNIT/ML ~~LOC~~ SOLN: SUBCUTANEOUS | 6 refills | Status: DC

## 2017-06-01 MED ORDER — HYDROCODONE-ACETAMINOPHEN 10-325 MG PO TABS: 1.0000 | ORAL_TABLET | Freq: Four times a day (QID) | ORAL | 0 refills | Status: DC | PRN

## 2017-06-01 NOTE — Progress Notes (Signed)
Subjective:    Patient ID: [name], female    DOB: 1936-04-27, 81 y.o.   MRN: [id number]  HPI Pt returns for f/u of diabetes mellitus: DM type: Insulin-requiring type 2 Dx'ed: 1007 Complications: renal insuff Therapy: insulin since 2008 GDM: never DKA: never Severe hypoglycemia: never Pancreatitis: never.  Other: she takes multiple daily injections.   Interval history: no cbg record, but states cbg's are in the 100's.  She checks fasting and in the afternoon.  There is no trend throughout the day.  She takes humalog 3 times a day (just before each meal) 07-16-24 units.   Past Medical History:  Diagnosis Date  . Allergic rhinitis   . Anterior chest wall pain   . Anxiety   . Asthma   . Cough   . DM type 2 (diabetes mellitus, type 2) ([place])   . GERD (gastroesophageal reflux disease)   . History of diverticulitis of colon   . HTN (hypertension)   . Hyperlipidemia   . IBS (irritable bowel syndrome)   . Iron deficiency anemia   . Osteoporosis, unspecified   . UTI (urinary tract infection)     Past Surgical History:  Procedure Laterality Date  . CARDIOVASCULAR STRESS TEST  02/25/04  . ESOPHAGOGASTRODUODENOSCOPY  12/26/01  . RIGHT OOPHORECTOMY  jan 2010    Social History   Social History  . Marital status: Widowed    Spouse name: N/A  . Number of children: N/A  . Years of education: N/A   Occupational History  . retired    Social History Main Topics  . Smoking status: Former Smoker    Packs/day: 0.30    Years: 5.00    Types: Cigarettes    Quit date: 10/02/1986  . Smokeless tobacco: Never Used  . Alcohol use No  . Drug use: No  . Sexual activity: Not on file   Other Topics Concern  . Not on file   Social History Narrative  . No narrative on file    Current Outpatient Prescriptions on File Prior to Visit  Medication Sig Dispense Refill  . acetaminophen (TYLENOL) 325 MG tablet Take 650 mg by mouth every 6 (six) hours as needed for pain.    Marland Kitchen albuterol  (PROAIR HFA) 108 (90 Base) MCG/ACT inhaler Inhale 2 puffs into the lungs every 4 (four) hours as needed for wheezing. 1 Inhaler 1  . amLODipine (NORVASC) 5 MG tablet TAKE 1 TABLET (5 MG TOTAL) BY MOUTH DAILY. 90 tablet 3  . Blood Glucose Monitoring Suppl (ONE TOUCH ULTRA 2) W/DEVICE KIT Check blood sugar 3 times per day. 1 each 0  . budesonide-formoterol (SYMBICORT) 160-4.5 MCG/ACT inhaler Inhale 2 puffs into the lungs 2 (two) times daily. 1 Inhaler 11  . ELIQUIS 5 MG TABS tablet TAKE 1 TABLET (5 MG TOTAL) BY MOUTH 2 (TWO) TIMES DAILY. 60 tablet 11  . Insulin Syringe-Needle U-100 (BD INSULIN SYRINGE ULTRAFINE) 31G X 5/16" 0.3 ML MISC Use as directed     . losartan-hydrochlorothiazide (HYZAAR) 100-12.5 MG tablet Take 0.5 tablets by mouth daily.    . montelukast (SINGULAIR) 10 MG tablet TAKE 1 TABLET BY MOUTH AT BEDTIME 90 tablet 0  . ondansetron (ZOFRAN) 4 MG tablet Take 1 tablet (4 mg total) by mouth every 6 (six) hours as needed for nausea or vomiting. 30 tablet 1  . ONE TOUCH LANCETS MISC Use as directed three times daily     . potassium chloride (K-DUR) 10 MEQ tablet Take 1 tablet (10  mEq total) by mouth daily. 30 tablet 11  . pravastatin (PRAVACHOL) 40 MG tablet TAKE 1 TABLET BY MOUTH DAILY 90 tablet 3   No current facility-administered medications on file prior to visit.     Allergies  Allergen Reactions  . Aspirin   . Ramipril     REACTION: Cough    Family History  Problem Relation Age of Onset  . Lung cancer Brother   . Melanoma Brother   . Hypertension Mother   . Stroke Mother   . Other Father        poor circulation  . Atopy Neg Hx   . Asthma Neg Hx     BP 130/68   Pulse (!) 53   Wt 196 lb (88.9 kg)   SpO2 98%   BMI 32.37 kg/m    Review of Systems She denies hypoglycemia. Chronic knee pain is worse.     Objective:   Physical Exam VITAL SIGNS:  See vs page GENERAL: no distress Pulses: foot pulses are intact bilaterally.   MSK: no deformity of the feet or  ankles.  CV: no edema of the legs or ankles Skin:  no ulcer on the feet or ankles.  normal color and temp on the feet and ankles Neuro: sensation is intact to touch on the feet and ankles.     I personally reviewed electrocardiogram tracing (today): Indication: DM Impression: AF  No MI.  No hypertrophy. Compared to: no significant change  Lab Results  Component Value Date   HGBA1C 6.3 06/01/2017       Assessment & Plan:  Insulin-requiring type 2 DM, with renal insuff: overcontrolled.  Knee pain, uncertain etiology: worse Vit-D deficiency: recheck today.    Patient Instructions  Please reduce the insulin to 3 times a day (just before each meal) 07-17-19 units. check your blood sugar twice a day.  vary the time of day when you check, between before the 3 meals, and at bedtime.  also check if you have symptoms of your blood sugar being too high or too low.  please keep a record of the readings and bring it to your next appointment here (or you can bring the meter itself).  You can write it on any piece of paper.  please call us sooner if your blood sugar goes below 70, or if you have a lot of readings over 200. blood tests are requested for you today.  We'll let you know about the results. Please see a specialist for your knee.  you will receive a phone call, about a day and time for an appointment Please come back for a regular physical appointment in 2 months.

## 2017-06-01 NOTE — Patient Instructions (Addendum)
Please reduce the insulin to 3 times a day (just before each meal) 07-17-19 units. check your blood sugar twice a day.  vary the time of day when you check, between before the 3 meals, and at bedtime.  also check if you have symptoms of your blood sugar being too high or too low.  please keep a record of the readings and bring it to your next appointment here (or you can bring the meter itself).  You can write it on any piece of paper.  please call us sooner if your blood sugar goes below 70, or if you have a lot of readings over 200. blood tests are requested for you today.  We'll let you know about the results. Please see a specialist for your knee.  you will receive a phone call, about a day and time for an appointment Please come back for a regular physical appointment in 2 months.

## 2017-06-02 MED ORDER — VITAMIN D (ERGOCALCIFEROL) 1.25 MG (50000 UNIT) PO CAPS: 50000.0000 [IU] | ORAL_CAPSULE | ORAL | 0 refills | Status: AC

## 2017-06-05 LAB — PTH, INTACT AND CALCIUM
CALCIUM: 9.4 mg/dL (ref 8.6–10.4)
PTH: 139 pg/mL — ABNORMAL HIGH (ref 14–64)

## 2017-06-12 DIAGNOSIS — M170 Bilateral primary osteoarthritis of knee: Secondary | ICD-10-CM | POA: Diagnosis not present

## 2017-06-14 ENCOUNTER — Other Ambulatory Visit: Payer: Self-pay | Admitting: Internal Medicine

## 2017-06-14 ENCOUNTER — Telehealth: Payer: Self-pay | Admitting: Endocrinology

## 2017-06-14 NOTE — Telephone Encounter (Signed)
I returned [name]'s call but had to leave message to call back.

## 2017-06-14 NOTE — Telephone Encounter (Signed)
[name] returning missed phone call. [name] stated this was in regards to patient taking half or full tablet of Losartan. Please call [name] and advise.

## 2017-06-14 NOTE — Telephone Encounter (Signed)
Please clarify Losartan dosage. Charletta Cousin tech w/ [facility] calling, please call back at number listed.  Ty.  -LL

## 2017-06-18 ENCOUNTER — Ambulatory Visit: Payer: Medicare Other | Admitting: Endocrinology

## 2017-06-19 ENCOUNTER — Other Ambulatory Visit: Payer: Self-pay | Admitting: Endocrinology

## 2017-06-19 DIAGNOSIS — Z1231 Encounter for screening mammogram for malignant neoplasm of breast: Secondary | ICD-10-CM

## 2017-06-29 ENCOUNTER — Encounter: Payer: Self-pay | Admitting: Internal Medicine

## 2017-06-29 ENCOUNTER — Ambulatory Visit (INDEPENDENT_AMBULATORY_CARE_PROVIDER_SITE_OTHER): Payer: Medicare Other | Admitting: Internal Medicine

## 2017-06-29 VITALS — BP 154/74 | HR 60 | Ht 65.25 in | Wt 196.4 lb

## 2017-06-29 DIAGNOSIS — I4891 Unspecified atrial fibrillation: Secondary | ICD-10-CM | POA: Diagnosis not present

## 2017-06-29 DIAGNOSIS — I10 Essential (primary) hypertension: Secondary | ICD-10-CM | POA: Diagnosis not present

## 2017-06-29 DIAGNOSIS — I2510 Atherosclerotic heart disease of native coronary artery without angina pectoris: Secondary | ICD-10-CM

## 2017-06-29 DIAGNOSIS — R911 Solitary pulmonary nodule: Secondary | ICD-10-CM | POA: Diagnosis not present

## 2017-06-29 MED ORDER — AMLODIPINE BESYLATE 5 MG PO TABS
5.0000 mg | ORAL_TABLET | Freq: Two times a day (BID) | ORAL | 3 refills | Status: DC
Start: 2017-06-29 — End: 2018-07-24

## 2017-06-29 NOTE — Patient Instructions (Signed)
Your physician has recommended you make the following change in your medication:  1.) increase amlodipine 5 mg to twice a day  Your physician wants you to follow-up in: March, 2019 with Dr. Harrington Challenger.  You will receive a reminder letter in the mail two months in advance. If you don't receive a letter, please call our office to schedule the follow-up appointment.

## 2017-06-29 NOTE — Progress Notes (Signed)
Cardiology Office Note   Date:  06/29/2017   ID:  [name], DOB 12-Aug-1936, MRN [id number]  PCP:  Renato Shin, MD  Cardiologist:   Dorris Carnes, MD   F/U of PAF and HTN     History of Present Illness: [name] is a 81 y.o. female with a history of HTN, DM, coronary artery calcificatiosn and hot flasshess  SHe also had an epsode in ED of atrial fib  CHADSVASc of 5   I saw her in September 2017    Myovuew with evidence of ischemi  Moderate anteirorly   I recomm CT scan  This showed Ca score of 57  Modrate CAD of LAD   FFR done  Did not appear flow limiting   Breathing is unchanged  Denies  CP    Small nodule seen on CT on R middle lobe    Current Meds  Medication Sig  . acetaminophen (TYLENOL) 325 MG tablet Take 650 mg by mouth every 6 (six) hours as needed for pain.  Marland Kitchen albuterol (PROAIR HFA) 108 (90 Base) MCG/ACT inhaler Inhale 2 puffs into the lungs every 4 (four) hours as needed for wheezing.  Marland Kitchen amLODipine (NORVASC) 5 MG tablet TAKE 1 TABLET (5 MG TOTAL) BY MOUTH DAILY.  Marland Kitchen Blood Glucose Monitoring Suppl (ONE TOUCH ULTRA 2) W/DEVICE KIT Check blood sugar 3 times per day.  . budesonide-formoterol (SYMBICORT) 160-4.5 MCG/ACT inhaler Inhale 2 puffs into the lungs 2 (two) times daily.  . Cholecalciferol (VITAMIN D PO) Take 1 capsule by mouth every other day.  Marland Kitchen ELIQUIS 5 MG TABS tablet TAKE 1 TABLET (5 MG TOTAL) BY MOUTH 2 (TWO) TIMES DAILY.  Marland Kitchen HYDROcodone-acetaminophen (NORCO) 10-325 MG tablet Take 1 tablet by mouth every 6 (six) hours as needed.  . insulin lispro (HUMALOG) 100 UNIT/ML injection 3 times a day (just before each meal) 07-17-19 units, and pen needles 3/day  . Insulin Syringe-Needle U-100 (BD INSULIN SYRINGE ULTRAFINE) 31G X 5/16" 0.3 ML MISC Use as directed   . losartan-hydrochlorothiazide (HYZAAR) 100-12.5 MG tablet Take 0.5 tablets by mouth daily.  . montelukast (SINGULAIR) 10 MG tablet TAKE 1 TABLET BY MOUTH AT BEDTIME  . ondansetron (ZOFRAN) 4 MG tablet Take 1  tablet (4 mg total) by mouth every 6 (six) hours as needed for nausea or vomiting.  . ONE TOUCH LANCETS MISC Use as directed three times daily   . potassium chloride (K-DUR) 10 MEQ tablet Take 1 tablet (10 mEq total) by mouth daily.  . pravastatin (PRAVACHOL) 40 MG tablet TAKE 1 TABLET BY MOUTH DAILY        Allergies:   Aspirin and Ramipril   Past Medical History:  Diagnosis Date  . Allergic rhinitis   . Anterior chest wall pain   . Anxiety   . Asthma   . Cough   . DM type 2 (diabetes mellitus, type 2) ([place])   . GERD (gastroesophageal reflux disease)   . History of diverticulitis of colon   . HTN (hypertension)   . Hyperlipidemia   . IBS (irritable bowel syndrome)   . Iron deficiency anemia   . Osteoporosis, unspecified   . UTI (urinary tract infection)     Past Surgical History:  Procedure Laterality Date  . CARDIOVASCULAR STRESS TEST  02/25/04  . ESOPHAGOGASTRODUODENOSCOPY  12/26/01  . RIGHT OOPHORECTOMY  jan 2010     Social History:  The patient  reports that she quit smoking about 30 years ago. Her smoking use included  Cigarettes. She has a 1.50 pack-year smoking history. She has never used smokeless tobacco. She reports that she does not drink alcohol or use drugs.   Family History:  The patient's family history includes Hypertension in her mother; Lung cancer in her brother; Melanoma in her brother; Other in her father; Stroke in her mother.    ROS:  Please see the history of present illness. All other systems are reviewed and  Negative to the above problem except as noted.    PHYSICAL EXAM: VS:  BP (!) 154/74   Pulse 60   Ht 5' 5.25" (1.657 m)   Wt 196 lb 6.4 oz (89.1 kg)   SpO2 99%   BMI 32.43 kg/m   GEN: Well nourished, well developed, in no acute distress  HEENT: normal  Neck: JVP normal  NO, carotid bruits, or masses Cardiac: RRR; no murmurs, rubs, or gallops,no edema  Respiratory:  clear to auscultation bilaterally, normal work of breathing GI:  soft, nontender, nondistended, + BS  No hepatomegaly  MS: no deformity Moving all extremities   Skin: warm and dry, no rash Neuro:  Strength and sensation are intact Psych: euthymic mood, full affect   EKG:  EKG is  Not ordered today.  In July Atrial fib  56 bpm  RBBB   Lipid Panel    Component Value Date/Time   CHOL 129 06/01/2017 0908   TRIG 77.0 06/01/2017 0908   TRIG 83 08/07/2006 1419   HDL 49.30 06/01/2017 0908   CHOLHDL 3 06/01/2017 0908   VLDL 15.4 06/01/2017 0908   LDLCALC 64 06/01/2017 0908      Wt Readings from Last 3 Encounters:  06/29/17 196 lb 6.4 oz (89.1 kg)  06/01/17 196 lb (88.9 kg)  05/02/17 195 lb 12.8 oz (88.8 kg)      ASSESSMENT AND PLAN:  1  Atrial fib  Keep on current regien   2  HTN  BP is not controlled  I would increase amlodipoine to 5 bid  F?U in march 2019  3  HL  COntnue meds     2.  Dyspnea  Will  Set up for stress myoview  I want to see what her exercise capacity/chronotropic completence  Swtich to Lexi if cant get HR up     5  Abnormal chest CT   1 year ago small nodule seen  Will set up for repeat CT with age and hx of tobacco  Disposition:   FU with  in   Signed, Dorris Carnes, MD  06/29/2017 11:29 AM    [address], [place], [place]  [address] Phone: [phone]; Fax: [phone]

## 2017-06-30 ENCOUNTER — Other Ambulatory Visit: Payer: Self-pay | Admitting: Endocrinology

## 2017-07-05 ENCOUNTER — Ambulatory Visit
Admission: RE | Admit: 2017-07-05 | Discharge: 2017-07-05 | Disposition: A | Discharge status: Home / Self Care | Payer: Medicare Other | Source: Ambulatory Visit | Attending: Endocrinology | Admitting: Endocrinology

## 2017-07-05 DIAGNOSIS — Z1231 Encounter for screening mammogram for malignant neoplasm of breast: Secondary | ICD-10-CM

## 2017-07-10 ENCOUNTER — Telehealth: Payer: Self-pay | Admitting: *Deleted

## 2017-07-10 DIAGNOSIS — R918 Other nonspecific abnormal finding of lung field: Secondary | ICD-10-CM

## 2017-07-10 NOTE — Telephone Encounter (Signed)
-----   Message from Dorris Carnes V, MD sent at 07/01/2017  9:12 PM EDT ----- Pt needs chest CT   Nodule on R lung seen last year  Small  Recomm a f/u since she smoked in past

## 2017-07-10 NOTE — Telephone Encounter (Signed)
Called patient and left message for her to call back.

## 2017-07-16 NOTE — Telephone Encounter (Signed)
Notes Recorded by Fay Records, MD on 08/13/2016 at 9:57 PM EST CT angiogram of hearrt shows probable moderate coronary artery disease. Waiting on additonal imaging results/calculations  Chest CT shows a small nodule in R middle lobe of lung Would get noncontrast CT in 1 year.   Order placed for non contrast chest CT.

## 2017-07-18 NOTE — Telephone Encounter (Signed)
Spoke with [name]. Explained the lung nodule seen last year on CTA and recommendation to repeat CT in one year.  Advised once pre cert is finished we will call to schedule.

## 2017-07-20 ENCOUNTER — Other Ambulatory Visit: Payer: Self-pay | Admitting: Endocrinology

## 2017-07-27 ENCOUNTER — Encounter: Payer: Medicare Other | Admitting: Internal Medicine

## 2017-07-30 ENCOUNTER — Encounter: Payer: Self-pay | Admitting: Internal Medicine

## 2017-07-30 ENCOUNTER — Ambulatory Visit (INDEPENDENT_AMBULATORY_CARE_PROVIDER_SITE_OTHER): Payer: Medicare Other | Admitting: Internal Medicine

## 2017-07-30 ENCOUNTER — Other Ambulatory Visit (INDEPENDENT_AMBULATORY_CARE_PROVIDER_SITE_OTHER): Payer: Medicare Other

## 2017-07-30 VITALS — BP 150/76 | HR 78 | Ht 65.0 in | Wt 195.0 lb

## 2017-07-30 DIAGNOSIS — J45991 Cough variant asthma: Secondary | ICD-10-CM | POA: Diagnosis not present

## 2017-07-30 LAB — CBC WITH DIFFERENTIAL/PLATELET
Basophils Absolute: 0.1 10*3/uL (ref 0.0–0.1)
Basophils Relative: 1.1 % (ref 0.0–3.0)
EOS ABS: 0.2 10*3/uL (ref 0.0–0.7)
EOS PCT: 2.8 % (ref 0.0–5.0)
HCT: 37.1 % (ref 36.0–46.0)
HEMOGLOBIN: 12.4 g/dL (ref 12.0–15.0)
Lymphocytes Relative: 18 % (ref 12.0–46.0)
Lymphs Abs: 1.3 10*3/uL (ref 0.7–4.0)
MCHC: 33.3 g/dL (ref 30.0–36.0)
MCV: 83.3 fl (ref 78.0–100.0)
MONO ABS: 0.9 10*3/uL (ref 0.1–1.0)
MONOS PCT: 11.5 % (ref 3.0–12.0)
Neutro Abs: 5 10*3/uL (ref 1.4–7.7)
Neutrophils Relative %: 66.6 % (ref 43.0–77.0)
Platelets: 176 10*3/uL (ref 150.0–400.0)
RBC: 4.45 Mil/uL (ref 3.87–5.11)
RDW: 18.3 % — ABNORMAL HIGH (ref 11.5–15.5)
WBC: 7.5 10*3/uL (ref 4.0–10.5)

## 2017-07-30 MED ORDER — PREDNISONE 10 MG PO TABS: ORAL_TABLET | ORAL | 0 refills | Status: DC

## 2017-07-30 NOTE — Patient Instructions (Signed)
Prednisone 10 mg take  4 each am x 2 days,   2 each am x 2 days,  1 each am x 2 days and stop    For cough > mucinex dm up to 1200 mg every 12 hours and add Try prilosec otc 20mg   Take 30-60 min before first meal of the day and Pepcid ac (famotidine) 20 mg one @  bedtime until cough is completely gone for at least a week without the need for cough suppression     GERD (REFLUX)  is an extremely common cause of respiratory symptoms just like yours , many times with no obvious heartburn at all.    It can be treated with medication, but also with lifestyle changes including elevation of the head of your bed (ideally with 6 inch  bed blocks),  Smoking cessation, avoidance of late meals, excessive alcohol, and avoid fatty foods, chocolate, peppermint, colas, red wine, and acidic juices such as orange juice.  NO MINT OR MENTHOL PRODUCTS SO NO COUGH DROPS   USE SUGARLESS CANDY INSTEAD (Jolley ranchers or Stover's or Life Savers) or even ice chips will also do - the key is to swallow to prevent all throat clearing. NO OIL BASED VITAMINS - use powdered substitutes.     Please schedule a follow up visit in 6  months but call sooner if needed

## 2017-07-30 NOTE — Progress Notes (Signed)
Subjective:     Patient ID: [name], female   DOB: 1936/08/27, 81 y.o.   MRN: [id number]    Brief patient profile:  20  yobf quit smoking 1988 with h/o asthma  And nl baseline pfts ( as of 01/04/2010) who had been using Advair on a p.r.n. basis and noticing increasing dyspnea and need for rescue therapy x one mo when seen 01/06/08 for pulmonary evaluation  so Advair stopped. Began Symbicort 160/4.96mg 2 puffs two times a day.   Returned 02/13/08 improved with decreased dyspnea and no rescue use   Returned 03/18/08 symptom free no rescue needed, stopped reglan, no flare   01/25/2017  f/u ov/Latrease Kunde re: asthma / ? Allergic rhinitis  rx symb160/singulair  Chief Complaint  Patient presents with  . Follow-up    Last seen 2015. She c/o minimal wheezing for the past wk "b/c of the pollen". She also has minimal dry cough.   She is using proair 2 x per wk on average.   only uses alb if overdoes it - never noct needed Doe = MMRC2 = can't walk a nl pace on a flat grade s sob but does fine slow and flat eg shopping rec For itching/ seezing runny nose take zyrtec 10 mg daily (typically use at bedtime as may make you slightly sleepy but works for 24 hours)  Work on inhaler technique:  Plan A = Automatic = symbicort 160 Take 2 puffs first thing in am and then another 2 puffs about 12 hours later.  Plan B = Backup Only use your albuterol as a rescue medication    Please schedule a follow up visit in 6 months but call sooner if needed  with all medications /inhalers/ solutions in hand so we can verify exactly what you are taking. This includes all medications from all doctors and over the counters     07/30/2017  f/u ov/Saifan Rayford re: asthma/ maint on symb/ singulair / rare saba  Chief Complaint  Patient presents with  . Follow-up    Pt c/o scratchy throat and minimal cough for the past 3 days. Cough is non prod. She rarely uses her albuterol.   doe improved = MMRC1 = can walk nl pace, flat grade, can't hurry or  go uphills or steps s sob    Min dry cough/ sense of pnds worse first thing in am but no change lower resp symptoms  No obvious day to day or daytime variability or assoc excess/ purulent sputum or mucus plugs or hemoptysis or cp or chest tightness, subjective wheeze or overt sinus or hb symptoms. No unusual exp hx or h/o childhood pna/ asthma or knowledge of premature birth.  Sleeping ok flat without nocturnal  or early am exacerbation  of respiratory  c/o's or need for noct saba. Also denies any obvious fluctuation of symptoms with weather or environmental changes or other aggravating or alleviating factors except as outlined above   Current Allergies, Complete Past Medical History, Past Surgical History, Family History, and Social History were reviewed in [employer].  ROS  The following are not active complaints unless bolded Hoarseness, sore throat, dysphagia, dental problems, itching, sneezing,  nasal congestion or discharge of excess mucus or purulent secretions, ear ache,   fever, chills, sweats, unintended wt loss or wt gain, classically pleuritic or exertional cp,  orthopnea pnd or leg swelling, presyncope, palpitations, abdominal pain, anorexia, nausea, vomiting, diarrhea  or change in bowel habits or change in bladder habits,  change in stools or change in urine, dysuria, hematuria,  rash, arthralgias, visual complaints, headache, numbness, weakness or ataxia or problems with walking or coordination,  change in mood/affect or memory.        Current Meds  Medication Sig  . acetaminophen (TYLENOL) 325 MG tablet Take 650 mg by mouth every 6 (six) hours as needed for pain.  Marland Kitchen albuterol (PROAIR HFA) 108 (90 Base) MCG/ACT inhaler Inhale 2 puffs into the lungs every 4 (four) hours as needed for wheezing.  Marland Kitchen amLODipine (NORVASC) 5 MG tablet Take 1 tablet (5 mg total) by mouth 2 (two) times daily.  . Blood Glucose Monitoring Suppl (ONE TOUCH ULTRA 2) W/DEVICE KIT  Check blood sugar 3 times per day.  . budesonide-formoterol (SYMBICORT) 160-4.5 MCG/ACT inhaler Inhale 2 puffs into the lungs 2 (two) times daily.  . Cholecalciferol (VITAMIN D PO) Take 1 capsule by mouth every other day.  Marland Kitchen ELIQUIS 5 MG TABS tablet TAKE 1 TABLET (5 MG TOTAL) BY MOUTH 2 (TWO) TIMES DAILY.  Marland Kitchen HYDROcodone-acetaminophen (NORCO) 10-325 MG tablet Take 1 tablet by mouth every 6 (six) hours as needed.  . insulin lispro (HUMALOG) 100 UNIT/ML injection 3 times a day (just before each meal) 07-17-19 units, and pen needles 3/day  . Insulin Syringe-Needle U-100 (BD INSULIN SYRINGE ULTRAFINE) 31G X 5/16" 0.3 ML MISC Use as directed   . losartan-hydrochlorothiazide (HYZAAR) 100-12.5 MG tablet Take 0.5 tablets by mouth daily.  . montelukast (SINGULAIR) 10 MG tablet TAKE 1 TABLET BY MOUTH AT BEDTIME  . ondansetron (ZOFRAN) 4 MG tablet Take 1 tablet (4 mg total) by mouth every 6 (six) hours as needed for nausea or vomiting.  . ONE TOUCH LANCETS MISC Use as directed three times daily   . potassium chloride (K-DUR) 10 MEQ tablet Take 1 tablet (10 mEq total) by mouth daily.  . pravastatin (PRAVACHOL) 40 MG tablet TAKE 1 TABLET BY MOUTH DAILY               Past Medical History:   OSTEOPOROSIS (ICD-733.00)  HYPERTENSION (ICD-401.9)  HYPERLIPIDEMIA (ICD-272.4)  DIVERTICULITIS, HX OF (ICD-V12.79)  DIABETES MELLITUS, TYPE II (ICD-250.00)  COLON CANCER, HX OF (ICD-V10.05)  ASTHMA (ICD-493.90)  -PFT's 03/18/08 minimal airflow obstruction  -PFT's January 04, 2010 No sign airflow obstruction  -Mastered HFA technique August 13, 2008 > confimed November 23, 2009  ANXIETY (ICD-300.00)  ANEMIA-IRON DEFICIENCY (ICD-280.9)  ALLERGIC RHINITIS (ICD-477.9)  IBS  GERD            Objective:   Physical Exam   wt 202 November 23, 2009> 199 January 04, 2010 > 209  03/22/11 > 214 10/16/2013 >  01/25/2017   193> 07/30/2017   195   Vital signs reviewed  - - Note on arrival 02 sats  98% on RA     amb  obese bf / congested sounding cough   HEENT: nl dentition, turbinates bilaterally, and oropharynx. Nl external ear canals without cough reflex   NECK :  without JVD/Nodes/TM/ nl carotid upstrokes bilaterally   LUNGS: no acc muscle use,  Nl contour chest with minimal insp and exp rhonchi bilaterally    CV:  RRR  no s3 or murmur or increase in P2, and no edema   ABD:  soft and nontender with nl inspiratory excursion in the supine position. No bruits or organomegaly appreciated, bowel sounds nl  MS:  Nl gait/ ext warm without deformities, calf tenderness, cyanosis or clubbing No obvious joint restrictions   SKIN: warm  and dry without lesions    NEURO:  alert, approp, nl sensorium with  no motor or cerebellar deficits apparent.             Assessment:

## 2017-07-31 ENCOUNTER — Ambulatory Visit (INDEPENDENT_AMBULATORY_CARE_PROVIDER_SITE_OTHER): Payer: Medicare Other | Admitting: Endocrinology

## 2017-07-31 ENCOUNTER — Encounter: Payer: Self-pay | Admitting: Endocrinology

## 2017-07-31 VITALS — BP 136/62 | HR 57 | Wt 198.6 lb

## 2017-07-31 DIAGNOSIS — M810 Age-related osteoporosis without current pathological fracture: Secondary | ICD-10-CM

## 2017-07-31 DIAGNOSIS — E1122 Type 2 diabetes mellitus with diabetic chronic kidney disease: Secondary | ICD-10-CM

## 2017-07-31 DIAGNOSIS — N183 Chronic kidney disease, stage 3 (moderate): Principal | ICD-10-CM

## 2017-07-31 DIAGNOSIS — N189 Chronic kidney disease, unspecified: Secondary | ICD-10-CM | POA: Diagnosis not present

## 2017-07-31 DIAGNOSIS — E559 Vitamin D deficiency, unspecified: Secondary | ICD-10-CM

## 2017-07-31 DIAGNOSIS — N289 Disorder of kidney and ureter, unspecified: Secondary | ICD-10-CM | POA: Diagnosis not present

## 2017-07-31 DIAGNOSIS — Z794 Long term (current) use of insulin: Secondary | ICD-10-CM | POA: Diagnosis not present

## 2017-07-31 DIAGNOSIS — Z0000 Encounter for general adult medical examination without abnormal findings: Secondary | ICD-10-CM

## 2017-07-31 HISTORY — DX: Disorder of kidney and ureter, unspecified: N28.9

## 2017-07-31 LAB — BASIC METABOLIC PANEL
BUN: 22 mg/dL (ref 6–23)
CHLORIDE: 107 meq/L (ref 96–112)
CO2: 26 meq/L (ref 19–32)
Calcium: 9.3 mg/dL (ref 8.4–10.5)
Creatinine, Ser: 1.57 mg/dL — ABNORMAL HIGH (ref 0.40–1.20)
GFR: 40.85 mL/min — ABNORMAL LOW (ref >60.00)
GLUCOSE: 149 mg/dL — AB (ref 70–99)
Potassium: 4.2 mEq/L (ref 3.5–5.1)
SODIUM: 141 meq/L (ref 135–145)

## 2017-07-31 LAB — RESPIRATORY ALLERGY PROFILE REGION II ~~LOC~~
ALLERGEN, A. ALTERNATA, M6: 0.27 kU/L — AB
Allergen, Comm Silver Birch, t9: <0.1 kU/L
Allergen, D pternoyssinus,d7: <0.1 kU/L
Allergen, Mouse Urine Protein, e78: <0.1 kU/L
Allergen, P. notatum, m1: 0.1 kU/L — ABNORMAL HIGH
Box Elder IgE: <0.1 kU/L
CLADOSPORIUM HERBARUM (M2) IGE: 0.17 kU/L — AB
CLASS: 0
CLASS: 0
CLASS: 0
CLASS: 0
CLASS: 0
CLASS: 0
CLASS: 0
CLASS: 0
CLASS: 0
CLASS: 0
CLASS: 0
Cat Dander: <0.1 kU/L
Class: 0
Class: 0
Class: 0
Class: 0
Class: 0
Class: 0
Class: 0
Class: 0
Class: 0
Class: 0
Class: 0
Class: 0
Class: 0
Cockroach: <0.1 kU/L
D. farinae: <0.1 kU/L
Elm IgE: <0.1 kU/L
IgE (Immunoglobulin E), Serum: 145 kU/L — ABNORMAL HIGH (ref <=114)
Johnson Grass: <0.1 kU/L
Timothy Grass: <0.1 kU/L

## 2017-07-31 LAB — POCT GLYCOSYLATED HEMOGLOBIN (HGB A1C): HEMOGLOBIN A1C: 5.5

## 2017-07-31 LAB — INTERPRETATION:

## 2017-07-31 LAB — VITAMIN D 25 HYDROXY (VIT D DEFICIENCY, FRACTURES): VITD: 31.98 ng/mL (ref 30.00–100.00)

## 2017-07-31 NOTE — Progress Notes (Signed)
we discussed code status.  pt requests full code, but would not want to be started or maintained on artificial life-support measures if there was not a reasonable chance of recovery 

## 2017-07-31 NOTE — Patient Instructions (Addendum)
Please reduce the insulin to 3 times a day (just before each meal) 07-17-19 units. The prednisone will increase your blood sugar over the next week or so.  Therefore, if you blood sugar is in the 200's, take an extra 5 units.  If it is over 300, take an extra 10 units.  check your blood sugar twice a day.  vary the time of day when you check, between before the 3 meals, and at bedtime.  also check if you have symptoms of your blood sugar being too high or too low.  please keep a record of the readings and bring it to your next appointment here (or you can bring the meter itself).  You can write it on any piece of paper.  please call us sooner if your blood sugar goes below 70, or if you have a lot of readings over 200. good diet and exercise significantly improve the control of your diabetes.  please let me know if you wish to be referred to a dietician.  high blood sugar is very risky to your health.  you should see an eye doctor and dentist every year.  It is very important to get all recommended vaccinations.  Please consider these measures for your health:  minimize alcohol.  Do not use tobacco products.  Have a colonoscopy at least every 10 years from age 67.  Women should have an annual mammogram from age 109.  Keep firearms safely stored.  Always use seat belts.  have working smoke alarms in your home.  See an eye doctor and dentist regularly.  Never drive under the influence of alcohol or drugs (including prescription drugs).   It is critically important to prevent falling down (keep floor areas well-lit, dry, and free of loose objects.  If you have a cane, walker, or wheelchair, you should use it, even for short trips around the house.  Wear flat-soled shoes.  Also, try not to rush) blood tests are requested for you today.  We'll let you know about the results.  Please come back for a regular physical appointment in 3 months.

## 2017-07-31 NOTE — Progress Notes (Signed)
Subjective:    Patient ID: [name], female    DOB: 1936-02-05, 81 y.o.   MRN: [id number]  HPI Pt returns for f/u of diabetes mellitus: DM type: Insulin-requiring type 2 Dx'ed: 9977 Complications: renal insuff and CAD. Therapy: insulin since 2008 GDM: never DKA: never Severe hypoglycemia: never Pancreatitis: never.  Other: she takes multiple daily injections.   Interval history: no cbg record, but states cbg's are lowest at lunch (approx 100).  pt states she feels well in general.   Past Medical History:  Diagnosis Date  . Allergic rhinitis   . Anterior chest wall pain   . Anxiety   . Asthma   . Cough   . DM type 2 (diabetes mellitus, type 2) ([place])   . GERD (gastroesophageal reflux disease)   . History of diverticulitis of colon   . HTN (hypertension)   . Hyperlipidemia   . IBS (irritable bowel syndrome)   . Iron deficiency anemia   . Osteoporosis, unspecified   . Renal insufficiency 07/31/2017  . UTI (urinary tract infection)     Past Surgical History:  Procedure Laterality Date  . CARDIOVASCULAR STRESS TEST  02/25/04  . ESOPHAGOGASTRODUODENOSCOPY  12/26/01  . RIGHT OOPHORECTOMY  jan 2010    Social History   Social History  . Marital status: Widowed    Spouse name: N/A  . Number of children: N/A  . Years of education: N/A   Occupational History  . retired    Social History Main Topics  . Smoking status: Former Smoker    Packs/day: 0.30    Years: 5.00    Types: Cigarettes    Quit date: 10/02/1986  . Smokeless tobacco: Never Used  . Alcohol use No  . Drug use: No  . Sexual activity: Not on file   Other Topics Concern  . Not on file   Social History Narrative  . No narrative on file    Current Outpatient Prescriptions on File Prior to Visit  Medication Sig Dispense Refill  . acetaminophen (TYLENOL) 325 MG tablet Take 650 mg by mouth every 6 (six) hours as needed for pain.    Marland Kitchen albuterol (PROAIR HFA) 108 (90 Base) MCG/ACT inhaler Inhale 2 puffs  into the lungs every 4 (four) hours as needed for wheezing. 1 Inhaler 1  . amLODipine (NORVASC) 5 MG tablet Take 1 tablet (5 mg total) by mouth 2 (two) times daily. 180 tablet 3  . Blood Glucose Monitoring Suppl (ONE TOUCH ULTRA 2) W/DEVICE KIT Check blood sugar 3 times per day. 1 each 0  . budesonide-formoterol (SYMBICORT) 160-4.5 MCG/ACT inhaler Inhale 2 puffs into the lungs 2 (two) times daily. 1 Inhaler 11  . Cholecalciferol (VITAMIN D PO) Take 1 capsule by mouth every other day.    Marland Kitchen ELIQUIS 5 MG TABS tablet TAKE 1 TABLET (5 MG TOTAL) BY MOUTH 2 (TWO) TIMES DAILY. 60 tablet 5  . HYDROcodone-acetaminophen (NORCO) 10-325 MG tablet Take 1 tablet by mouth every 6 (six) hours as needed. 30 tablet 0  . insulin lispro (HUMALOG) 100 UNIT/ML injection 3 times a day (just before each meal) 07-17-19 units, and pen needles 3/day 20 mL 6  . Insulin Syringe-Needle U-100 (BD INSULIN SYRINGE ULTRAFINE) 31G X 5/16" 0.3 ML MISC Use as directed     . losartan-hydrochlorothiazide (HYZAAR) 100-12.5 MG tablet Take 0.5 tablets by mouth daily.    . montelukast (SINGULAIR) 10 MG tablet TAKE 1 TABLET BY MOUTH AT BEDTIME 90 tablet 0  . ondansetron (  ZOFRAN) 4 MG tablet Take 1 tablet (4 mg total) by mouth every 6 (six) hours as needed for nausea or vomiting. 30 tablet 1  . ONE TOUCH LANCETS MISC Use as directed three times daily     . potassium chloride (K-DUR) 10 MEQ tablet Take 1 tablet (10 mEq total) by mouth daily. 30 tablet 11  . pravastatin (PRAVACHOL) 40 MG tablet TAKE 1 TABLET BY MOUTH DAILY 90 tablet 3  . predniSONE (DELTASONE) 10 MG tablet Take  4 each am x 2 days,   2 each am x 2 days,  1 each am x 2 days and stop 14 tablet 0   No current facility-administered medications on file prior to visit.     Allergies  Allergen Reactions  . Aspirin   . Ramipril     REACTION: Cough    Family History  Problem Relation Age of Onset  . Lung cancer Brother   . Melanoma Brother   . Hypertension Mother   . Stroke  Mother   . Other Father        poor circulation  . Atopy Neg Hx   . Asthma Neg Hx   . Breast cancer Neg Hx     BP 136/62   Pulse (!) 57   Wt 198 lb 9.6 oz (90.1 kg)   SpO2 98%   BMI 33.05 kg/m    Review of Systems She denies hypoglycemia    Objective:   Physical Exam VITAL SIGNS:  See vs page GENERAL: no distress Pulses: foot pulses are intact bilaterally.   MSK: no deformity of the feet or ankles.  CV: no edema of the legs or ankles Skin:  no ulcer on the feet or ankles.  normal color and temp on the feet and ankles Neuro: sensation is intact to touch on the feet and ankles.     Lab Results  Component Value Date   HGBA1C 6.3 06/01/2017       Assessment & Plan:  Insulin-requiring type 2 DM, with CAD: overcontrolled.  asthma, new to me.  Prednisone will affect glycemic control soon.  Vit-D deficiency: due for recheck  blood tests are requested for you today.  We'll let you know about the results. Please reduce the insulin to 3 times a day (just before each meal) 07-17-19 units. The prednisone will increase your blood sugar over the next week or so.  Subjective:   Patient here for Medicare annual wellness visit and management of other chronic and acute problems.     Risk factors: advanced age    78 of Physicians Providing Medical Care to Patient:  See "snapshot"   Activities of Daily Living: In your present state of health, do you have any difficulty performing the following activities (lives alone, but children visit daily)?:  Preparing food and eating?: No  Bathing yourself: No  Getting dressed: No  Using the toilet: No  Moving around from place to place: No  In the past year have you fallen or had a near fall?: No    Home Safety: Has smoke detector and wears seat belts. No firearms.   Opioid Use: none  Diet and Exercise  Current exercise habits: pt says limits by health problems Dietary issues discussed: pt reports she does not have a healthy  diet.    Depression Screen  Q1: Over the past two weeks, have you felt down, depressed or hopeless? no  Q2: Over the past two weeks, have you felt little interest or pleasure  in doing things? no   The following portions of the patient's history were reviewed and updated as appropriate: allergies, current medications, past family history, past medical history, past social history, past surgical history and problem list.   Review of Systems  Denies hearing loss, and visual loss Objective:   Vision:  Advertising account executive, so he declines VA today.  Hearing: grossly normal.  Body mass index:  See vs page Msk: pt easily and quickly performs "get-up-and-go" from a sitting position.  Cognitive Impairment Assessment: cognition, memory and judgment appear normal.  remembers 3/3 at 5 minutes.  excellent recall.  can easily read and write a sentence.  alert and oriented x 3   Assessment:   Medicare wellness utd on preventive parameters.    Plan:   During the course of the visit the patient was educated and counseled about appropriate screening and preventive services including:        Fall prevention is advised today  Screening mammography is UTD Bone densitometry screening is ordered Diabetes care is done today Nutrition counseling is offered.   Vaccines are updated as needed.   Patient Instructions (the written plan) was given to the patient.

## 2017-07-31 NOTE — Assessment & Plan Note (Signed)
-  PFT's 03/18/08 minimal airflow obstruction  -PFT's January 04, 2010 No sign airflow obstruction   - FENO 01/25/2017  =   25 p am symb 160 x 2 pffs - Spirometry 01/25/2017  wnl x for min curvature on f/v p am symb 160 x 2 / no saba prior  - 07/30/2017   After extensive coaching HFA effectiveness =    75% from baseline 50%    Mild flare in setting of nonspecific upper airway symptoms ? Viral or allergy but certainly no sinusitis or bacterial infection likely here  Therefore rec Prednisone 10 mg take  4 each am x 2 days,   2 each am x 2 days,  1 each am x 2 days and stop and reviewed optimal rx for gerd with flare of cough:  Of the three most common causes of  Sub-acute or recurrent or chronic cough, only one (GERD)  can actually contribute to/ trigger  the other two (asthma and post nasal drip syndrome)  and perpetuate the cylce of cough.  While not intuitively obvious, many patients with chronic low grade reflux do not cough until there is a primary insult that disturbs the protective epithelial barrier and exposes sensitive nerve endings.   This is typically viral but can be direct physical injury such as with an endotracheal tube.   The point is that once this occurs, it is difficult to eliminate the cycle  using anything but a maximally effective acid suppression regimen at least in the short run, accompanied by an appropriate diet to address non acid GERD and control / eliminate the cough itself for at least 3 days.    I had an extended discussion with the patient reviewing all relevant studies completed to date and  lasting 15 to 20 minutes of a 25 minute visit    Each maintenance medication was reviewed in detail including most importantly the difference between maintenance and prns and under what circumstances the prns are to be triggered using an action plan format that is not reflected in the computer generated alphabetically organized AVS.    Please see AVS for specific instructions unique  to this visit that I personally wrote and verbalized to the the pt in detail and then reviewed with pt  by my nurse highlighting any  changes in therapy recommended at today's visit to their plan of care.

## 2017-08-01 NOTE — Progress Notes (Signed)
Spoke with pt and notified of results per Dr. Wert. Pt verbalized understanding and denied any questions. 

## 2017-08-13 ENCOUNTER — Ambulatory Visit (INDEPENDENT_AMBULATORY_CARE_PROVIDER_SITE_OTHER)
Admission: RE | Admit: 2017-08-13 | Discharge: 2017-08-13 | Disposition: A | Discharge status: Home / Self Care | Payer: Medicare Other | Source: Ambulatory Visit | Attending: Internal Medicine | Admitting: Internal Medicine

## 2017-08-13 DIAGNOSIS — R918 Other nonspecific abnormal finding of lung field: Secondary | ICD-10-CM | POA: Diagnosis not present

## 2017-08-13 DIAGNOSIS — H524 Presbyopia: Secondary | ICD-10-CM | POA: Diagnosis not present

## 2017-08-13 DIAGNOSIS — H43393 Other vitreous opacities, bilateral: Secondary | ICD-10-CM | POA: Diagnosis not present

## 2017-08-13 DIAGNOSIS — Z794 Long term (current) use of insulin: Secondary | ICD-10-CM | POA: Diagnosis not present

## 2017-08-13 DIAGNOSIS — E119 Type 2 diabetes mellitus without complications: Secondary | ICD-10-CM | POA: Diagnosis not present

## 2017-08-13 DIAGNOSIS — H52203 Unspecified astigmatism, bilateral: Secondary | ICD-10-CM | POA: Diagnosis not present

## 2017-08-13 LAB — HM DIABETES EYE EXAM

## 2017-08-16 ENCOUNTER — Telehealth: Payer: Self-pay

## 2017-08-16 DIAGNOSIS — R911 Solitary pulmonary nodule: Secondary | ICD-10-CM

## 2017-08-16 NOTE — Telephone Encounter (Signed)
-----   Message from Dorris Carnes V, MD sent at 08/14/2017 11:58 PM EST ----- Small nodules seen in lungs appear to be stable  Suggest they are benign Recomm f/u for last time in 12-14 months

## 2017-08-16 NOTE — Telephone Encounter (Signed)
Notes recorded by Teressa Senter, RN on 08/16/2017 at 5:22 PM EST Patient made aware of results. Patient verbalizes understanding. CT chest ordered for 12-14 months. Patient in agreement with plan.

## 2017-08-19 ENCOUNTER — Other Ambulatory Visit: Payer: Self-pay | Admitting: Endocrinology

## 2017-08-20 ENCOUNTER — Other Ambulatory Visit: Payer: Self-pay | Admitting: Endocrinology

## 2017-10-17 ENCOUNTER — Other Ambulatory Visit: Payer: Self-pay | Admitting: Endocrinology

## 2017-10-29 ENCOUNTER — Ambulatory Visit: Payer: Medicare Other | Admitting: Endocrinology

## 2017-10-29 ENCOUNTER — Encounter: Payer: Self-pay | Admitting: Endocrinology

## 2017-10-29 ENCOUNTER — Ambulatory Visit
Admission: RE | Admit: 2017-10-29 | Discharge: 2017-10-29 | Disposition: A | Discharge status: Home / Self Care | Payer: Medicare Other | Source: Ambulatory Visit | Attending: Endocrinology | Admitting: Endocrinology

## 2017-10-29 VITALS — BP 160/78 | HR 65 | Wt 196.0 lb

## 2017-10-29 DIAGNOSIS — N183 Chronic kidney disease, stage 3 (moderate): Secondary | ICD-10-CM | POA: Diagnosis not present

## 2017-10-29 DIAGNOSIS — R062 Wheezing: Secondary | ICD-10-CM

## 2017-10-29 DIAGNOSIS — E1122 Type 2 diabetes mellitus with diabetic chronic kidney disease: Secondary | ICD-10-CM | POA: Diagnosis not present

## 2017-10-29 DIAGNOSIS — Z794 Long term (current) use of insulin: Secondary | ICD-10-CM | POA: Diagnosis not present

## 2017-10-29 DIAGNOSIS — R05 Cough: Secondary | ICD-10-CM | POA: Diagnosis not present

## 2017-10-29 LAB — POCT GLYCOSYLATED HEMOGLOBIN (HGB A1C): Hemoglobin A1C: 6

## 2017-10-29 MED ORDER — CLOTRIMAZOLE 10 MG MT TROC: 10.0000 mg | Freq: Every day | OROMUCOSAL | 1 refills | Status: DC

## 2017-10-29 MED ORDER — METHYLPREDNISOLONE 4 MG PO TBPK: ORAL_TABLET | ORAL | 0 refills | Status: DC

## 2017-10-29 MED ORDER — PROMETHAZINE-CODEINE 6.25-10 MG/5ML PO SYRP: 5.0000 mL | ORAL_SOLUTION | ORAL | 0 refills | Status: DC | PRN

## 2017-10-29 MED ORDER — LOSARTAN POTASSIUM-HCTZ 100-12.5 MG PO TABS: 1.0000 | ORAL_TABLET | Freq: Every day | ORAL | 3 refills | Status: DC

## 2017-10-29 MED ORDER — AZITHROMYCIN 500 MG PO TABS: 500.0000 mg | ORAL_TABLET | Freq: Every day | ORAL | 0 refills | Status: DC

## 2017-10-29 NOTE — Patient Instructions (Signed)
I have sent these prescriptions to your pharmacy: throat lozenge, steroid "pack," antibiotic, and cough syrup. It is expected that your blood sugar will go up with the steroids.  Therefore, take extra humalog with each meal: In the 200's: 5 extra 300 or over; 10 extra. I hope you feel better soon.  If you don't feel better by next week, please call back.  Please call sooner if you get worse.

## 2017-10-29 NOTE — Progress Notes (Signed)
Subjective:    Patient ID: [name], female    DOB: 1936/01/01, 82 y.o.   MRN: [id number]  HPI  Pt returns for f/u of diabetes mellitus: DM type: Insulin-requiring type 2 Dx'ed: 2244 Complications: renal insuff Therapy: insulin since 2008 GDM: never DKA: never Severe hypoglycemia: never Pancreatitis: never.  Other: she takes multiple daily injections.   Interval history: Pt states 6 days of moderate wheezing in the chest, and assoc mild sob.  Past Medical History:  Diagnosis Date  . Allergic rhinitis   . Anterior chest wall pain   . Anxiety   . Asthma   . Cough   . DM type 2 (diabetes mellitus, type 2) ([place])   . GERD (gastroesophageal reflux disease)   . History of diverticulitis of colon   . HTN (hypertension)   . Hyperlipidemia   . IBS (irritable bowel syndrome)   . Iron deficiency anemia   . Osteoporosis, unspecified   . Renal insufficiency 07/31/2017  . UTI (urinary tract infection)     Past Surgical History:  Procedure Laterality Date  . CARDIOVASCULAR STRESS TEST  02/25/04  . ESOPHAGOGASTRODUODENOSCOPY  12/26/01  . RIGHT OOPHORECTOMY  jan 2010    Social History   Socioeconomic History  . Marital status: Widowed    Spouse name: Not on file  . Number of children: Not on file  . Years of education: Not on file  . Highest education level: Not on file  Social Needs  . Financial resource strain: Not on file  . Food insecurity - worry: Not on file  . Food insecurity - inability: Not on file  . Transportation needs - medical: Not on file  . Transportation needs - non-medical: Not on file  Occupational History  . Occupation: retired  Tobacco Use  . Smoking status: Former Smoker    Packs/day: 0.30    Years: 5.00    Pack years: 1.50    Types: Cigarettes    Last attempt to quit: 10/02/1986    Years since quitting: 31.0  . Smokeless tobacco: Never Used  Substance and Sexual Activity  . Alcohol use: No  . Drug use: No  . Sexual activity: Not on file    Other Topics Concern  . Not on file  Social History Narrative  . Not on file    Current Outpatient Medications on File Prior to Visit  Medication Sig Dispense Refill  . acetaminophen (TYLENOL) 325 MG tablet Take 650 mg by mouth every 6 (six) hours as needed for pain.    Marland Kitchen albuterol (PROAIR HFA) 108 (90 Base) MCG/ACT inhaler Inhale 2 puffs into the lungs every 4 (four) hours as needed for wheezing. 1 Inhaler 1  . amLODipine (NORVASC) 5 MG tablet Take 1 tablet (5 mg total) by mouth 2 (two) times daily. 180 tablet 3  . Blood Glucose Monitoring Suppl (ONE TOUCH ULTRA 2) W/DEVICE KIT Check blood sugar 3 times per day. 1 each 0  . budesonide-formoterol (SYMBICORT) 160-4.5 MCG/ACT inhaler Inhale 2 puffs into the lungs 2 (two) times daily. 1 Inhaler 11  . Cholecalciferol (VITAMIN D PO) Take 1 capsule by mouth every other day.    Marland Kitchen ELIQUIS 5 MG TABS tablet TAKE 1 TABLET (5 MG TOTAL) BY MOUTH 2 (TWO) TIMES DAILY. 60 tablet 5  . insulin lispro (HUMALOG) 100 UNIT/ML injection 3 times a day (just before each meal) 07-17-19 units, and pen needles 3/day 20 mL 6  . Insulin Syringe-Needle U-100 (BD INSULIN SYRINGE ULTRAFINE) 31G  X 5/16" 0.3 ML MISC Use as directed     . montelukast (SINGULAIR) 10 MG tablet TAKE 1 TABLET BY MOUTH AT BEDTIME 90 tablet 0  . ondansetron (ZOFRAN) 4 MG tablet Take 1 tablet (4 mg total) by mouth every 6 (six) hours as needed for nausea or vomiting. 30 tablet 1  . ONE TOUCH LANCETS MISC Use as directed three times daily     . potassium chloride (K-DUR) 10 MEQ tablet Take 1 tablet (10 mEq total) by mouth daily. 30 tablet 11  . pravastatin (PRAVACHOL) 40 MG tablet TAKE 1 TABLET BY MOUTH DAILY 90 tablet 3   No current facility-administered medications on file prior to visit.     Allergies  Allergen Reactions  . Aspirin   . Ramipril     REACTION: Cough    Family History  Problem Relation Age of Onset  . Lung cancer Brother   . Melanoma Brother   . Hypertension Mother   .  Stroke Mother   . Other Father        poor circulation  . Atopy Neg Hx   . Asthma Neg Hx   . Breast cancer Neg Hx     BP (!) 160/78 (BP Location: Left Arm, Patient Position: Sitting, Cuff Size: Normal)   Pulse 65   Wt 196 lb (88.9 kg)   SpO2 98%   BMI 32.62 kg/m    Review of Systems She had fever, but that is resolved.  She has nasal congestion.  No earache.      Objective:   Physical Exam VITAL SIGNS:  See vs page GENERAL: no distress head: no deformity  eyes: no periorbital swelling, no proptosis  external nose and ears are normal  mouth: no lesion seen.  Tongue is white Both eac's and tm's are normal LUNGS:  Clear to auscultation, except for diffuse wheezing.    Lab Results  Component Value Date   HGBA1C 6.0 10/29/2017      Assessment & Plan:  acute bronchitis, new. Insulin-requiring type 2 DM, with renal insuff: overcontrolled.  Next week, please reduce the humalog to 3 times a day (just before each meal) 02-08-14 units White tongue, new.  Poss thrush   Patient Instructions  I have sent these prescriptions to your pharmacy: throat lozenge, steroid "pack," antibiotic, and cough syrup. It is expected that your blood sugar will go up with the steroids.  Therefore, take extra humalog with each meal: In the 200's: 5 extra 300 or over; 10 extra. I hope you feel better soon.  If you don't feel better by next week, please call back.  Please call sooner if you get worse.

## 2017-10-30 ENCOUNTER — Telehealth: Payer: Self-pay | Admitting: Endocrinology

## 2017-10-30 MED ORDER — INSULIN LISPRO 100 UNIT/ML ~~LOC~~ SOLN: SUBCUTANEOUS | 6 refills | Status: DC

## 2017-10-30 NOTE — Telephone Encounter (Signed)
please call patient: As your a1c was a little too low, please reduce the insulin to 3 times a day (just before each meal) 02-08-14 units.  Please do not start this reduction for 1 more week, as you are on the steroids now.

## 2017-10-31 ENCOUNTER — Ambulatory Visit: Payer: Medicare Other | Admitting: Endocrinology

## 2017-10-31 NOTE — Telephone Encounter (Signed)
-----   Message from Renato Shin, MD sent at 10/29/2017  5:55 PM EST ----- please call patient: cxr suggests you might have too much fluid. I don't think this is the cause of your recent illness. However, please increase the losartan-HCTZ to 1 whole pill per day.

## 2017-10-31 NOTE — Telephone Encounter (Signed)
Home phone number was busy and the cell number listed I LVM to call office back

## 2017-11-01 NOTE — Telephone Encounter (Signed)
I called patient to make sure that received messages from yesterday. She stated that she is still super congested & wondered if she should follow up antibiotic with anything like mucinex?

## 2017-11-01 NOTE — Telephone Encounter (Signed)
I called patient & advised her on claritin-D.

## 2017-11-01 NOTE — Telephone Encounter (Signed)
The antibiotic pill was rx'ed a few days ago.  Did the pharmacy give it to you?

## 2017-11-01 NOTE — Telephone Encounter (Signed)
Yes she picked those up, but she didn't feel that 5 was enough to get rid if her congestion.

## 2017-11-01 NOTE — Telephone Encounter (Signed)
The pill needs time to work. Loratadine-d (non-prescription) will help your congestion. Please let us know if you get worse.

## 2017-11-20 ENCOUNTER — Other Ambulatory Visit: Payer: Self-pay | Admitting: Internal Medicine

## 2017-12-17 ENCOUNTER — Ambulatory Visit: Payer: Medicare Other | Admitting: Endocrinology

## 2017-12-17 ENCOUNTER — Encounter: Payer: Self-pay | Admitting: Endocrinology

## 2017-12-17 DIAGNOSIS — J219 Acute bronchiolitis, unspecified: Secondary | ICD-10-CM

## 2017-12-17 MED ORDER — CEFUROXIME AXETIL 250 MG PO TABS: 250.0000 mg | ORAL_TABLET | Freq: Two times a day (BID) | ORAL | 0 refills | Status: AC

## 2017-12-17 MED ORDER — PROMETHAZINE-DM 6.25-15 MG/5ML PO SYRP: 5.0000 mL | ORAL_SOLUTION | Freq: Four times a day (QID) | ORAL | 0 refills | Status: DC | PRN

## 2017-12-17 NOTE — Patient Instructions (Addendum)
I have sent 2 prescription to your pharmacy: antibiotic and cough syrup.  I hope you feel better soon.  If you don't feel better by next week, please call back.  Please call sooner if you get worse. Please come back for a follow-up appointment in 2 months.

## 2017-12-17 NOTE — Progress Notes (Signed)
Subjective:    Patient ID: [name], female    DOB: 04/19/1936, 82 y.o.   MRN: [id number]  HPI Pt states 8 days of moderate dry-quality cough in the chest, and assoc headache.   She says cbg's are well-controlled.  She denies hypoglycemia.  Past Medical History:  Diagnosis Date  . Allergic rhinitis   . Anterior chest wall pain   . Anxiety   . Asthma   . Cough   . DM type 2 (diabetes mellitus, type 2) ([place])   . GERD (gastroesophageal reflux disease)   . History of diverticulitis of colon   . HTN (hypertension)   . Hyperlipidemia   . IBS (irritable bowel syndrome)   . Iron deficiency anemia   . Osteoporosis, unspecified   . Renal insufficiency 07/31/2017  . UTI (urinary tract infection)     Past Surgical History:  Procedure Laterality Date  . CARDIOVASCULAR STRESS TEST  02/25/04  . ESOPHAGOGASTRODUODENOSCOPY  12/26/01  . RIGHT OOPHORECTOMY  jan 2010    Social History   Socioeconomic History  . Marital status: Widowed    Spouse name: Not on file  . Number of children: Not on file  . Years of education: Not on file  . Highest education level: Not on file  Social Needs  . Financial resource strain: Not on file  . Food insecurity - worry: Not on file  . Food insecurity - inability: Not on file  . Transportation needs - medical: Not on file  . Transportation needs - non-medical: Not on file  Occupational History  . Occupation: retired  Tobacco Use  . Smoking status: Former Smoker    Packs/day: 0.30    Years: 5.00    Pack years: 1.50    Types: Cigarettes    Last attempt to quit: 10/02/1986    Years since quitting: 31.2  . Smokeless tobacco: Never Used  Substance and Sexual Activity  . Alcohol use: No  . Drug use: No  . Sexual activity: Not on file  Other Topics Concern  . Not on file  Social History Narrative  . Not on file    Current Outpatient Medications on File Prior to Visit  Medication Sig Dispense Refill  . acetaminophen (TYLENOL) 325 MG tablet  Take 650 mg by mouth every 6 (six) hours as needed for pain.    Marland Kitchen albuterol (PROAIR HFA) 108 (90 Base) MCG/ACT inhaler Inhale 2 puffs into the lungs every 4 (four) hours as needed for wheezing. 1 Inhaler 1  . amLODipine (NORVASC) 5 MG tablet Take 1 tablet (5 mg total) by mouth 2 (two) times daily. 180 tablet 3  . Blood Glucose Monitoring Suppl (ONE TOUCH ULTRA 2) W/DEVICE KIT Check blood sugar 3 times per day. 1 each 0  . budesonide-formoterol (SYMBICORT) 160-4.5 MCG/ACT inhaler Inhale 2 puffs into the lungs 2 (two) times daily. 1 Inhaler 11  . Cholecalciferol (VITAMIN D PO) Take 1 capsule by mouth every other day.    . clotrimazole (MYCELEX) 10 MG troche Take 1 tablet (10 mg total) by mouth 5 (five) times daily. 35 tablet 1  . ELIQUIS 5 MG TABS tablet TAKE 1 TABLET (5 MG TOTAL) BY MOUTH 2 (TWO) TIMES DAILY. 60 tablet 5  . insulin lispro (HUMALOG) 100 UNIT/ML injection 3 times a day (just before each meal) 02-08-14 units, and pen needles 3/day 20 mL 6  . Insulin Syringe-Needle U-100 (BD INSULIN SYRINGE ULTRAFINE) 31G X 5/16" 0.3 ML MISC Use as directed     .  losartan-hydrochlorothiazide (HYZAAR) 100-12.5 MG tablet Take 1 tablet by mouth daily. 90 tablet 3  . montelukast (SINGULAIR) 10 MG tablet TAKE 1 TABLET BY MOUTH AT BEDTIME 90 tablet 0  . ondansetron (ZOFRAN) 4 MG tablet Take 1 tablet (4 mg total) by mouth every 6 (six) hours as needed for nausea or vomiting. 30 tablet 1  . ONE TOUCH LANCETS MISC Use as directed three times daily     . potassium chloride (K-DUR) 10 MEQ tablet Take 1 tablet (10 mEq total) by mouth daily. 30 tablet 11  . pravastatin (PRAVACHOL) 40 MG tablet TAKE 1 TABLET BY MOUTH DAILY 90 tablet 3  . promethazine-codeine (PHENERGAN WITH CODEINE) 6.25-10 MG/5ML syrup Take 5 mLs by mouth every 4 (four) hours as needed. 120 mL 0   No current facility-administered medications on file prior to visit.     Allergies  Allergen Reactions  . Aspirin   . Ramipril     REACTION: Cough      Family History  Problem Relation Age of Onset  . Lung cancer Brother   . Melanoma Brother   . Hypertension Mother   . Stroke Mother   . Other Father        poor circulation  . Atopy Neg Hx   . Asthma Neg Hx   . Breast cancer Neg Hx     BP 134/82   Pulse 72   Ht 5' 5.25" (1.657 m)   Wt 195 lb (88.5 kg)   SpO2 99%   BMI 32.20 kg/m   Review of Systems She also has nausea and nasal congestion.  Fever is resolved.  Denies sob    Objective:   Physical Exam VITAL SIGNS:  See vs page GENERAL: no distress head: no deformity  eyes: no periorbital swelling, no proptosis  external nose and ears are normal  mouth: no lesion seen Both eac's are occluded with cerumen LUNGS:  Clear to auscultation     Assessment & Plan:  AB, new. Insulin-requiring type 2 DM, with renal insuff: well-controlled.    Patient Instructions  I have sent 2 prescription to your pharmacy: antibiotic and cough syrup.  I hope you feel better soon.  If you don't feel better by next week, please call back.  Please call sooner if you get worse. Please come back for a follow-up appointment in 2 months.

## 2017-12-28 ENCOUNTER — Other Ambulatory Visit: Payer: Self-pay

## 2018-01-15 ENCOUNTER — Other Ambulatory Visit: Payer: Self-pay | Admitting: Endocrinology

## 2018-01-28 ENCOUNTER — Ambulatory Visit: Payer: Medicare Other | Admitting: Internal Medicine

## 2018-01-28 ENCOUNTER — Encounter: Payer: Self-pay | Admitting: Internal Medicine

## 2018-01-28 VITALS — BP 150/80 | HR 61 | Ht 67.0 in | Wt 200.0 lb

## 2018-01-28 DIAGNOSIS — J302 Other seasonal allergic rhinitis: Secondary | ICD-10-CM | POA: Diagnosis not present

## 2018-01-28 DIAGNOSIS — R0609 Other forms of dyspnea: Secondary | ICD-10-CM

## 2018-01-28 DIAGNOSIS — J45991 Cough variant asthma: Secondary | ICD-10-CM

## 2018-01-28 LAB — NITRIC OXIDE: Nitric Oxide: 17

## 2018-01-28 NOTE — Progress Notes (Signed)
Subjective:     Patient ID: [name], female   DOB: 1936-09-07, 82 y.o.   MRN: [id number]   Brief patient profile:  33  yobf quit smoking 1988 with h/o asthma  And nl baseline pfts ( as of 01/04/2010) who had been using Advair on a p.r.n. basis and noticing increasing dyspnea and need for rescue therapy x one mo when seen 01/06/08 for pulmonary evaluation  so Advair stopped. Began Symbicort 160/4.58mg 2 puffs two times a day.  Returned 02/13/08 improved with decreased dyspnea and no rescue use   Returned 03/18/08 symptom free no rescue needed, stopped reglan, no flare    01/25/2017  f/u ov/[facility] re: asthma / ? Allergic rhinitis  rx symb160/singulair  Chief Complaint  Patient presents with  . Follow-up    Last seen 2015. She c/o minimal wheezing for the past wk "b/c of the pollen". She also has minimal dry cough.   She is using proair 2 x per wk on average.   only uses alb if overdoes it - never noct needed Doe = MMRC2 = can't walk a nl pace on a flat grade s sob but does fine slow and flat eg shopping rec Prednisone 10 mg take  4 each am x 2 days,   2 each am x 2 days,  1 each am x 2 days and stop  For cough > mucinex dm up to 1200 mg every 12 hours and add Try prilosec otc 244m Take 30-60 min before first meal of the day and Pepcid ac (famotidine) 20 mg one @  bedtime until cough is completely gone for at least a week without the need for cough suppression GERD diet   01/28/2018  f/u ov/[facility] re:  Asthma  Yearly f/u maiint on symb 160 2bid and singulair  Chief Complaint  Patient presents with  . Follow-up    Reports increased sneezing and dry cough with increase of pollen.   Dyspnea:  MMRC2 = can't walk a nl pace on a flat grade s sob but does fine slow and flat walking at mall  Cough: correlates with sneezing outdoors only Sleep: no resp symptoms SABA use:  Maybe twice a week    No obvious day to day or daytime variability or assoc excess/ purulent sputum or mucus plugs or hemoptysis or  cp or chest tightness, subjective wheeze or overt sinus or hb symptoms. No unusual exposure hx or h/o childhood pna/ asthma or knowledge of premature birth.  Sleeping  1-2 pillows  without nocturnal  or early am exacerbation  of respiratory  c/o's or need for noct saba. Also denies any obvious fluctuation of symptoms with weather or environmental changes or other aggravating or alleviating factors except as outlined above   Current Allergies, Complete Past Medical History, Past Surgical History, Family History, and Social History were reviewed in [employer].  ROS  The following are not active complaints unless bolded Hoarseness, sore throat, dysphagia, dental problems, itching, sneezing,  nasal congestion or discharge of excess mucus or purulent secretions, ear ache,   fever, chills, sweats, unintended wt loss or wt gain, classically pleuritic or exertional cp,  orthopnea pnd or arm/hand swelling  or leg swelling, presyncope, palpitations, abdominal pain, anorexia, nausea, vomiting, diarrhea  or change in bowel habits or change in bladder habits, change in stools or change in urine, dysuria, hematuria,  rash, arthralgias, visual complaints, headache, numbness, weakness or ataxia or problems with walking or coordination,  change  in mood or  memory.        Current Meds  Medication Sig  . acetaminophen (TYLENOL) 325 MG tablet Take 650 mg by mouth every 6 (six) hours as needed for pain.  Marland Kitchen amLODipine (NORVASC) 5 MG tablet Take 1 tablet (5 mg total) by mouth 2 (two) times daily.  . Blood Glucose Monitoring Suppl (ONE TOUCH ULTRA 2) W/DEVICE KIT Check blood sugar 3 times per day.  . budesonide-formoterol (SYMBICORT) 160-4.5 MCG/ACT inhaler Inhale 2 puffs into the lungs 2 (two) times daily.  . clotrimazole (MYCELEX) 10 MG troche Take 1 tablet (10 mg total) by mouth 5 (five) times daily.  Marland Kitchen ELIQUIS 5 MG TABS tablet TAKE 1 TABLET (5 MG TOTAL) BY MOUTH 2 (TWO) TIMES DAILY.  Marland Kitchen  insulin lispro (HUMALOG) 100 UNIT/ML injection 3 times a day (just before each meal) 02-08-14 units, and pen needles 3/day  . Insulin Syringe-Needle U-100 (BD INSULIN SYRINGE ULTRAFINE) 31G X 5/16" 0.3 ML MISC Use as directed   . losartan-hydrochlorothiazide (HYZAAR) 100-12.5 MG tablet Take 1 tablet by mouth daily.  . montelukast (SINGULAIR) 10 MG tablet TAKE 1 TABLET BY MOUTH AT BEDTIME  . ONE TOUCH LANCETS MISC Use as directed three times daily   . potassium chloride (K-DUR) 10 MEQ tablet Take 1 tablet (10 mEq total) by mouth daily.  . pravastatin (PRAVACHOL) 40 MG tablet TAKE 1 TABLET BY MOUTH DAILY  . promethazine-codeine (PHENERGAN WITH CODEINE) 6.25-10 MG/5ML syrup Take 5 mLs by mouth every 4 (four) hours as needed.  . promethazine-dextromethorphan (PROMETHAZINE-DM) 6.25-15 MG/5ML syrup Take 5 mLs by mouth 4 (four) times daily as needed for cough.                      Past Medical History:   OSTEOPOROSIS (ICD-733.00)  HYPERTENSION (ICD-401.9)  HYPERLIPIDEMIA (ICD-272.4)  DIVERTICULITIS, HX OF (ICD-V12.79)  DIABETES MELLITUS, TYPE II (ICD-250.00)  COLON CANCER, HX OF (ICD-V10.05)  ASTHMA (ICD-493.90)  -PFT's 03/18/08 minimal airflow obstruction  -PFT's January 04, 2010 No sign airflow obstruction  -Mastered HFA technique August 13, 2008 > confimed November 23, 2009  ANXIETY (ICD-300.00)  ANEMIA-IRON DEFICIENCY (ICD-280.9)  ALLERGIC RHINITIS (ICD-477.9)  IBS  GERD            Objective:   Physical Exam   wt 202 November 23, 2009> 199 January 04, 2010 > 209  03/22/11 > 214 10/16/2013 >  01/25/2017   193> 01/28/2018   200   Vital signs reviewed - Note on arrival 02 sats  98% on RA     Pleasant mod hoarse wf nad  HEENT: nl dentition, turbinates bilaterally, and oropharynx. Nl external ear canals without cough reflex   NECK :  without JVD/Nodes/TM/ nl carotid upstrokes bilaterally   LUNGS: no acc muscle use,  Nl contour chest which is clear to A and P bilaterally  without cough on insp or exp maneuvers   CV:  RRR  no s3 or murmur or increase in P2, and no edema   ABD:  soft and nontender with nl inspiratory excursion in the supine position. No bruits or organomegaly appreciated, bowel sounds nl  MS:  Nl gait/ ext warm without deformities, calf tenderness, cyanosis or clubbing No obvious joint restrictions   SKIN: warm and dry without lesions    NEURO:  alert, approp, nl sensorium with  no motor or cerebellar deficits apparent.       I personally reviewed images and agree with radiology impression as follows:  CXR:   10/29/17 Enlargement of cardiac silhouette with pulmonary vascular congestion. No acute abnormalities.        Assessment:

## 2018-01-28 NOTE — Patient Instructions (Addendum)
For itching/ sneezing > zyrtec 10 mg one at bedtime (over the counter)   Work on inhaler technique:  relax and gently blow all the way out then take a nice smooth deep breath back in, triggering the inhaler at same time you start breathing in.  Hold for up to 5 seconds if you can. Blow out thru nose. Rinse and gargle with water when done  Keep walking regularly and see if using your albuterol 15 min before helps you do more  Or not.     Please schedule a follow up visit in 3 months but call sooner if needed

## 2018-01-30 ENCOUNTER — Encounter: Payer: Self-pay | Admitting: Internal Medicine

## 2018-01-30 DIAGNOSIS — R0602 Shortness of breath: Secondary | ICD-10-CM | POA: Insufficient documentation

## 2018-01-30 DIAGNOSIS — R0609 Other forms of dyspnea: Secondary | ICD-10-CM

## 2018-01-30 NOTE — Assessment & Plan Note (Signed)
-  PFT's 03/18/08 minimal airflow obstruction  -PFT's January 04, 2010 No sign airflow obstruction   - FENO 01/25/2017  =   23 p am symb 160 x 2 pffs - Spirometry 01/25/2017  wnl x for min curvature on f/v p am symb 160 x 2 / no saba prior - 07/30/2017   After extensive coaching HFA effectiveness =    75% from baseline 50%  - Allergy profile 07/30/17  >  Eos 0.2 /  IgE  145  RAST pos mold    - FENO 01/28/2018  =   17 - Spirometry 01/28/2018  FEV1 1.22 (65%)  Ratio 79    All goals of chronic asthma control met including optimal function and elimination of symptoms with minimal need for rescue therapy.  Contingencies discussed in full including contacting this office immediately if not controlling the symptoms using the rule of two's.      Each maintenance medication was reviewed in detail including most importantly the difference between maintenance and as needed and under what circumstances the prns are to be used.  Please see AVS for specific  Instructions which are unique to this visit and I personally typed out  which were reviewed in detail in writing with the patient and a copy provided.

## 2018-01-30 NOTE — Assessment & Plan Note (Signed)
Try zyrtec prn

## 2018-01-30 NOTE — Assessment & Plan Note (Signed)
More likely this is due to obesity/ deconditioning than any form of airflow limitation including asthma  Ok to try saba prior to exertion to see if helps and report back if this is the case/ otherwise should just use as a rescue as prev discussed and return in 3 months this time to be sure she's heading in the right direction.   I had an extended discussion with the patient reviewing all relevant studies completed to date and  lasting 15 to 20 minutes of a 25 minute yearly eva    Each maintenance medication was reviewed in detail including most importantly the difference between maintenance and prns and under what circumstances the prns are to be triggered using an action plan format that is not reflected in the computer generated alphabetically organized AVS.    Please see AVS for specific instructions unique to this visit that I personally wrote and verbalized to the the pt in detail and then reviewed with pt  by my nurse highlighting any  changes in therapy recommended at today's visit to their plan of care.

## 2018-02-11 ENCOUNTER — Other Ambulatory Visit: Payer: Self-pay | Admitting: Internal Medicine

## 2018-02-11 ENCOUNTER — Telehealth: Payer: Self-pay | Admitting: Internal Medicine

## 2018-02-11 NOTE — Telephone Encounter (Signed)
Spoke with patient-aware that Symbicort has been refilled this morning with additional refills. Nothing more needed at this time.

## 2018-02-15 ENCOUNTER — Ambulatory Visit (INDEPENDENT_AMBULATORY_CARE_PROVIDER_SITE_OTHER): Payer: Medicare Other | Admitting: Endocrinology

## 2018-02-15 ENCOUNTER — Encounter: Payer: Self-pay | Admitting: Endocrinology

## 2018-02-15 VITALS — BP 160/62 | HR 77 | Wt 200.2 lb

## 2018-02-15 DIAGNOSIS — N183 Chronic kidney disease, stage 3 (moderate): Secondary | ICD-10-CM

## 2018-02-15 DIAGNOSIS — E1122 Type 2 diabetes mellitus with diabetic chronic kidney disease: Secondary | ICD-10-CM

## 2018-02-15 DIAGNOSIS — Z794 Long term (current) use of insulin: Secondary | ICD-10-CM | POA: Diagnosis not present

## 2018-02-15 LAB — POCT GLYCOSYLATED HEMOGLOBIN (HGB A1C): HEMOGLOBIN A1C: 6

## 2018-02-15 MED ORDER — LOSARTAN POTASSIUM-HCTZ 100-25 MG PO TABS: 1.0000 | ORAL_TABLET | Freq: Every day | ORAL | 3 refills | Status: DC

## 2018-02-15 MED ORDER — INSULIN LISPRO 100 UNIT/ML ~~LOC~~ SOLN: SUBCUTANEOUS | 6 refills | Status: DC

## 2018-02-15 NOTE — Progress Notes (Signed)
Subjective:    Patient ID: [name], female    DOB: 07-15-1936, 82 y.o.   MRN: [id number]  HPI Pt returns for f/u of diabetes mellitus: DM type: Insulin-requiring type 2 Dx'ed: 5051 Complications: renal insuff Therapy: insulin since 2008 GDM: never DKA: never Severe hypoglycemia: never Pancreatitis: never.  Other: she takes multiple daily injections.   Interval history: no cbg record, but states cbg's vary from 110-258.  She says it is lowest in the afternoon.  She takes humalog, 3 times a day (just before each meal) 02-09-24 units.   Past Medical History:  Diagnosis Date  . Allergic rhinitis   . Anterior chest wall pain   . Anxiety   . Asthma   . Cough   . DM type 2 (diabetes mellitus, type 2) ([place])   . GERD (gastroesophageal reflux disease)   . History of diverticulitis of colon   . HTN (hypertension)   . Hyperlipidemia   . IBS (irritable bowel syndrome)   . Iron deficiency anemia   . Osteoporosis, unspecified   . Renal insufficiency 07/31/2017  . UTI (urinary tract infection)     Past Surgical History:  Procedure Laterality Date  . CARDIOVASCULAR STRESS TEST  02/25/04  . ESOPHAGOGASTRODUODENOSCOPY  12/26/01  . RIGHT OOPHORECTOMY  jan 2010    Social History   Socioeconomic History  . Marital status: Widowed    Spouse name: Not on file  . Number of children: Not on file  . Years of education: Not on file  . Highest education level: Not on file  Occupational History  . Occupation: retired  Scientific laboratory technician  . Financial resource strain: Not on file  . Food insecurity:    Worry: Not on file    Inability: Not on file  . Transportation needs:    Medical: Not on file    Non-medical: Not on file  Tobacco Use  . Smoking status: Former Smoker    Packs/day: 0.30    Years: 5.00    Pack years: 1.50    Types: Cigarettes    Last attempt to quit: 10/02/1986    Years since quitting: 31.3  . Smokeless tobacco: Never Used  Substance and Sexual Activity  . Alcohol use:  No  . Drug use: No  . Sexual activity: Not on file  Lifestyle  . Physical activity:    Days per week: Not on file    Minutes per session: Not on file  . Stress: Not on file  Relationships  . Social connections:    Talks on phone: Not on file    Gets together: Not on file    Attends religious service: Not on file    Active member of club or organization: Not on file    Attends meetings of clubs or organizations: Not on file    Relationship status: Not on file  . Intimate partner violence:    Fear of current or ex partner: Not on file    Emotionally abused: Not on file    Physically abused: Not on file    Forced sexual activity: Not on file  Other Topics Concern  . Not on file  Social History Narrative  . Not on file    Current Outpatient Medications on File Prior to Visit  Medication Sig Dispense Refill  . acetaminophen (TYLENOL) 325 MG tablet Take 650 mg by mouth every 6 (six) hours as needed for pain.    Marland Kitchen amLODipine (NORVASC) 5 MG tablet Take 1 tablet (5  mg total) by mouth 2 (two) times daily. 180 tablet 3  . Blood Glucose Monitoring Suppl (ONE TOUCH ULTRA 2) W/DEVICE KIT Check blood sugar 3 times per day. 1 each 0  . Cholecalciferol (VITAMIN D PO) Take 1 capsule by mouth every other day.    . clotrimazole (MYCELEX) 10 MG troche Take 1 tablet (10 mg total) by mouth 5 (five) times daily. 35 tablet 1  . ELIQUIS 5 MG TABS tablet TAKE 1 TABLET (5 MG TOTAL) BY MOUTH 2 (TWO) TIMES DAILY. 60 tablet 5  . Insulin Syringe-Needle U-100 (BD INSULIN SYRINGE ULTRAFINE) 31G X 5/16" 0.3 ML MISC Use as directed     . montelukast (SINGULAIR) 10 MG tablet TAKE 1 TABLET BY MOUTH AT BEDTIME 90 tablet 0  . ondansetron (ZOFRAN) 4 MG tablet Take 1 tablet (4 mg total) by mouth every 6 (six) hours as needed for nausea or vomiting. 30 tablet 1  . ONE TOUCH LANCETS MISC Use as directed three times daily     . potassium chloride (K-DUR) 10 MEQ tablet Take 1 tablet (10 mEq total) by mouth daily. 30 tablet  11  . pravastatin (PRAVACHOL) 40 MG tablet TAKE 1 TABLET BY MOUTH DAILY 90 tablet 3  . SYMBICORT 160-4.5 MCG/ACT inhaler INHALE 2 PUFFS INTO THE LUNGS TWO TIMES DAILY 10.2 Inhaler 11  . albuterol (PROAIR HFA) 108 (90 Base) MCG/ACT inhaler Inhale 2 puffs into the lungs every 4 (four) hours as needed for wheezing. 1 Inhaler 1   No current facility-administered medications on file prior to visit.     Allergies  Allergen Reactions  . Aspirin   . Ramipril     REACTION: Cough    Family History  Problem Relation Age of Onset  . Lung cancer Brother   . Melanoma Brother   . Hypertension Mother   . Stroke Mother   . Other Father        poor circulation  . Atopy Neg Hx   . Asthma Neg Hx   . Breast cancer Neg Hx     BP (!) 160/62   Pulse 77   Wt 200 lb 3.2 oz (90.8 kg)   SpO2 98%   BMI 31.36 kg/m   Review of Systems She denies hypoglycemia.      Objective:   Physical Exam VITAL SIGNS:  See vs page GENERAL: no distress Pulses: dorsalis pedis intact bilat.   MSK: no deformity of the feet CV: no leg edema Skin:  no ulcer on the feet.  normal color and temp on the feet. Neuro: sensation is intact to touch on the feet Ext: There is bilateral onychomycosis of the toenails.   Lab Results  Component Value Date   CREATININE 1.57 (H) 07/31/2017   BUN 22 07/31/2017   NA 141 07/31/2017   K 4.2 07/31/2017   CL 107 07/31/2017   CO2 26 07/31/2017    Lab Results  Component Value Date   HGBA1C 6.0 02/15/2018       Assessment & Plan:  Insulin-requiring type 2 DM: overcontrolled.   Renal insuff: she does not need basal insulin.   HTN: she needs increased rx.    Patient Instructions  Please reduce humalog to 3 times a day (just before each meal) 02-04-24 units. I have sent a prescription to your pharmacy, to increase the losartan-HCTZ to 100/25, 1 pill daily.   check your blood sugar twice a day.  vary the time of day when you check, between before the 3  meals, and at bedtime.   also check if you have symptoms of your blood sugar being too high or too low.  please keep a record of the readings and bring it to your next appointment here (or you can bring the meter itself).  You can write it on any piece of paper.  please call us sooner if your blood sugar goes below 70, or if you have a lot of readings over 200. Please come back for a follow-up appointment in 3 months.

## 2018-02-15 NOTE — Patient Instructions (Addendum)
Please reduce humalog to 3 times a day (just before each meal) 02-04-24 units. I have sent a prescription to your pharmacy, to increase the losartan-HCTZ to 100/25, 1 pill daily.   check your blood sugar twice a day.  vary the time of day when you check, between before the 3 meals, and at bedtime.  also check if you have symptoms of your blood sugar being too high or too low.  please keep a record of the readings and bring it to your next appointment here (or you can bring the meter itself).  You can write it on any piece of paper.  please call us sooner if your blood sugar goes below 70, or if you have a lot of readings over 200. Please come back for a follow-up appointment in 3 months.

## 2018-03-22 ENCOUNTER — Ambulatory Visit (HOSPITAL_COMMUNITY)
Admission: EM | Admit: 2018-03-22 | Discharge: 2018-03-22 | Disposition: A | Discharge status: Home / Self Care | Payer: Medicare Other | Attending: Internal Medicine | Admitting: Internal Medicine

## 2018-03-22 ENCOUNTER — Telehealth: Payer: Self-pay | Admitting: Endocrinology

## 2018-03-22 ENCOUNTER — Encounter (HOSPITAL_COMMUNITY): Payer: Self-pay

## 2018-03-22 ENCOUNTER — Ambulatory Visit (INDEPENDENT_AMBULATORY_CARE_PROVIDER_SITE_OTHER): Payer: Medicare Other

## 2018-03-22 ENCOUNTER — Ambulatory Visit (HOSPITAL_COMMUNITY): Payer: Medicare Other

## 2018-03-22 DIAGNOSIS — Z886 Allergy status to analgesic agent status: Secondary | ICD-10-CM | POA: Insufficient documentation

## 2018-03-22 DIAGNOSIS — E785 Hyperlipidemia, unspecified: Secondary | ICD-10-CM | POA: Insufficient documentation

## 2018-03-22 DIAGNOSIS — R1032 Left lower quadrant pain: Secondary | ICD-10-CM | POA: Diagnosis not present

## 2018-03-22 DIAGNOSIS — Z794 Long term (current) use of insulin: Secondary | ICD-10-CM | POA: Insufficient documentation

## 2018-03-22 DIAGNOSIS — J45909 Unspecified asthma, uncomplicated: Secondary | ICD-10-CM | POA: Diagnosis not present

## 2018-03-22 DIAGNOSIS — N189 Chronic kidney disease, unspecified: Secondary | ICD-10-CM | POA: Insufficient documentation

## 2018-03-22 DIAGNOSIS — I4891 Unspecified atrial fibrillation: Secondary | ICD-10-CM | POA: Diagnosis not present

## 2018-03-22 DIAGNOSIS — Z7901 Long term (current) use of anticoagulants: Secondary | ICD-10-CM | POA: Insufficient documentation

## 2018-03-22 DIAGNOSIS — Z8249 Family history of ischemic heart disease and other diseases of the circulatory system: Secondary | ICD-10-CM | POA: Diagnosis not present

## 2018-03-22 DIAGNOSIS — Z79899 Other long term (current) drug therapy: Secondary | ICD-10-CM | POA: Diagnosis not present

## 2018-03-22 DIAGNOSIS — R109 Unspecified abdominal pain: Secondary | ICD-10-CM | POA: Diagnosis not present

## 2018-03-22 DIAGNOSIS — K219 Gastro-esophageal reflux disease without esophagitis: Secondary | ICD-10-CM | POA: Diagnosis not present

## 2018-03-22 DIAGNOSIS — I2510 Atherosclerotic heart disease of native coronary artery without angina pectoris: Secondary | ICD-10-CM | POA: Insufficient documentation

## 2018-03-22 DIAGNOSIS — F419 Anxiety disorder, unspecified: Secondary | ICD-10-CM | POA: Diagnosis not present

## 2018-03-22 DIAGNOSIS — Z7951 Long term (current) use of inhaled steroids: Secondary | ICD-10-CM | POA: Insufficient documentation

## 2018-03-22 DIAGNOSIS — K589 Irritable bowel syndrome without diarrhea: Secondary | ICD-10-CM | POA: Insufficient documentation

## 2018-03-22 DIAGNOSIS — R197 Diarrhea, unspecified: Secondary | ICD-10-CM | POA: Diagnosis not present

## 2018-03-22 DIAGNOSIS — I129 Hypertensive chronic kidney disease with stage 1 through stage 4 chronic kidney disease, or unspecified chronic kidney disease: Secondary | ICD-10-CM | POA: Insufficient documentation

## 2018-03-22 DIAGNOSIS — E1122 Type 2 diabetes mellitus with diabetic chronic kidney disease: Secondary | ICD-10-CM | POA: Diagnosis not present

## 2018-03-22 DIAGNOSIS — E559 Vitamin D deficiency, unspecified: Secondary | ICD-10-CM | POA: Insufficient documentation

## 2018-03-22 DIAGNOSIS — Z87891 Personal history of nicotine dependence: Secondary | ICD-10-CM | POA: Insufficient documentation

## 2018-03-22 LAB — CBC
HEMATOCRIT: 35.7 % — AB (ref 36.0–46.0)
Hemoglobin: 12 g/dL (ref 12.0–15.0)
MCH: 26.3 pg (ref 26.0–34.0)
MCHC: 33.6 g/dL (ref 30.0–36.0)
MCV: 78.3 fL (ref 78.0–100.0)
Platelets: 188 10*3/uL (ref 150–400)
RBC: 4.56 MIL/uL (ref 3.87–5.11)
RDW: 16.5 % — AB (ref 11.5–15.5)
WBC: 7.2 10*3/uL (ref 4.0–10.5)

## 2018-03-22 LAB — COMPREHENSIVE METABOLIC PANEL
ALBUMIN: 3.9 g/dL (ref 3.5–5.0)
ALT: 16 U/L (ref 14–54)
AST: 23 U/L (ref 15–41)
Alkaline Phosphatase: 75 U/L (ref 38–126)
Anion gap: 10 (ref 5–15)
BILIRUBIN TOTAL: 1.2 mg/dL (ref 0.3–1.2)
BUN: 22 mg/dL — AB (ref 6–20)
CHLORIDE: 110 mmol/L (ref 101–111)
CO2: 21 mmol/L — ABNORMAL LOW (ref 22–32)
Calcium: 9.2 mg/dL (ref 8.9–10.3)
Creatinine, Ser: 1.88 mg/dL — ABNORMAL HIGH (ref 0.44–1.00)
GFR calc Af Amer: 28 mL/min — ABNORMAL LOW (ref >60)
GFR calc non Af Amer: 24 mL/min — ABNORMAL LOW (ref >60)
GLUCOSE: 176 mg/dL — AB (ref 65–99)
POTASSIUM: 3.9 mmol/L (ref 3.5–5.1)
Sodium: 141 mmol/L (ref 135–145)
TOTAL PROTEIN: 7.2 g/dL (ref 6.5–8.1)

## 2018-03-22 MED ORDER — ONDANSETRON HCL 4 MG PO TABS: 4.0000 mg | ORAL_TABLET | Freq: Four times a day (QID) | ORAL | 0 refills | Status: DC

## 2018-03-22 MED ORDER — METRONIDAZOLE 500 MG PO TABS: 500.0000 mg | ORAL_TABLET | Freq: Two times a day (BID) | ORAL | 0 refills | Status: DC

## 2018-03-22 MED ORDER — CIPROFLOXACIN HCL 250 MG PO TABS: 250.0000 mg | ORAL_TABLET | Freq: Two times a day (BID) | ORAL | 0 refills | Status: AC

## 2018-03-22 NOTE — ED Triage Notes (Signed)
Pt presents with complaints of abdominal pain x 1 week with diarrhea x 1 day.

## 2018-03-22 NOTE — Telephone Encounter (Signed)
Yes, please.

## 2018-03-22 NOTE — ED Provider Notes (Signed)
[facility]    CSN: [id number] Arrival date & time: 03/22/18  1006     History   Chief Complaint Chief Complaint  Patient presents with  . Abdominal Pain    HPI [name] is a 82 y.o. female.   She presents today with one-week history of left lower quadrant pain, nausea.  Had the onset yesterday of watery diarrhea, 4 episodes since last night.  No blood, no black material.  The pain is steady/constant, occasionally sharp but mostly achy.  Pain is nonradiating.  She has not been vomiting, but has altered her diet, really just eating Jell-O.  No fever.  Rates current pain a 6-7/10.  She has a history of constipation, describes diarrhea around impaction, and also has history of diverticulitis.  Takes a stool softener regularly.  Usually has bowel movements 2-3 times per week, not painful, not large, not hard. No dysuria, no urinary frequency.  No unusual vaginal discharge or bleeding. Additional chart review reveals history of diabetes, A. fib, not currently anticoagulated, and chronic renal insufficiency with GFR in the 40s in October 2018.    HPI  Past Medical History:  Diagnosis Date  . Allergic rhinitis   . Anterior chest wall pain   . Anxiety   . Asthma   . Cough   . DM type 2 (diabetes mellitus, type 2) ([place])   . GERD (gastroesophageal reflux disease)   . History of diverticulitis of colon   . HTN (hypertension)   . Hyperlipidemia   . IBS (irritable bowel syndrome)   . Iron deficiency anemia   . Osteoporosis, unspecified   . Renal insufficiency 07/31/2017  . UTI (urinary tract infection)     Patient Active Problem List   Diagnosis Date Noted  . DOE (dyspnea on exertion) 01/30/2018  . Acute bronchiolitis 12/17/2017  . Wheezing 10/29/2017  . Vitamin D deficiency 07/31/2017  . Renal insufficiency 07/31/2017  . Knee pain 06/01/2017  . Morbid obesity due to excess calories ([place]) 01/25/2017  . Diabetes ([place]) 04/12/2016  . Cough 01/20/2015  . Atrial  fibrillation ([place]) 05/28/2014  . Personal history of colonic polyps 05/28/2014  . Vomiting 01/29/2014  . Coronary atherosclerosis of native coronary artery 09/01/2013  . Routine general medical examination at a health care facility 07/17/2011  . Encounter for long-term (current) use of other medications 07/06/2011  . Nonspecific (abnormal) findings on radiological and other examination of body structure 11/09/2009  . IRRITABLE BOWEL SYNDROME 05/07/2009  . CHEST PAIN 05/07/2009  . ADNEXAL MASS, RIGHT 07/22/2008  . HEMORRHOIDS, RECURRENT 01/27/2008  . DIVERTICULOSIS OF COLON 01/27/2008  . OTH ABNORMAL FIND RAD EXAMINATION BREAST 01/27/2008  . GERD 01/13/2008  . UTI 09/28/2007  . Dyslipidemia 05/27/2007  . ANEMIA-IRON DEFICIENCY 05/27/2007  . ANXIETY 05/27/2007  . Essential hypertension 05/27/2007  . Seasonal allergic rhinitis 05/27/2007  . Cough variant asthma 05/27/2007  . Osteoporosis 05/27/2007  . DIVERTICULITIS, HX OF 05/27/2007    Past Surgical History:  Procedure Laterality Date  . CARDIOVASCULAR STRESS TEST  02/25/04  . ESOPHAGOGASTRODUODENOSCOPY  12/26/01  . RIGHT OOPHORECTOMY  jan 2010    Home Medications    Prior to Admission medications   Medication Sig Start Date End Date Taking? Authorizing Provider  acetaminophen (TYLENOL) 325 MG tablet Take 650 mg by mouth every 6 (six) hours as needed for pain.   Yes [provider]  amLODipine (NORVASC) 5 MG tablet Take 1 tablet (5 mg total) by mouth 2 (two) times daily. 06/29/17  Yes Fay Records, MD  Blood Glucose Monitoring Suppl (ONE TOUCH ULTRA 2) W/DEVICE KIT Check blood sugar 3 times per day. 07/24/14  Yes Renato Shin, MD  ELIQUIS 5 MG TABS tablet TAKE 1 TABLET (5 MG TOTAL) BY MOUTH 2 (TWO) TIMES DAILY. 11/20/17  Yes Fay Records, MD  insulin lispro (HUMALOG) 100 UNIT/ML injection 3 times a day (just before each meal) 02-04-24 units, and pen needles 3/day 02/15/18  Yes Renato Shin, MD  Insulin Syringe-Needle  U-100 (BD INSULIN SYRINGE ULTRAFINE) 31G X 5/16" 0.3 ML MISC Use as directed    Yes [provider]  losartan-hydrochlorothiazide (HYZAAR) 100-25 MG tablet Take 1 tablet by mouth daily. 02/15/18  Yes Renato Shin, MD  montelukast (SINGULAIR) 10 MG tablet TAKE 1 TABLET BY MOUTH AT BEDTIME 01/15/18  Yes Renato Shin, MD  ondansetron (ZOFRAN) 4 MG tablet Take 1 tablet (4 mg total) by mouth every 6 (six) hours as needed for nausea or vomiting. 11/09/16  Yes Renato Shin, MD  ONE TOUCH LANCETS MISC Use as directed three times daily    Yes [provider]  potassium chloride (K-DUR) 10 MEQ tablet Take 1 tablet (10 mEq total) by mouth daily. 04/12/16  Yes Renato Shin, MD  pravastatin (PRAVACHOL) 40 MG tablet TAKE 1 TABLET BY MOUTH DAILY 08/20/17  Yes Renato Shin, MD  SYMBICORT 160-4.5 MCG/ACT inhaler INHALE 2 PUFFS INTO THE LUNGS TWO TIMES DAILY 02/11/18  Yes Tanda Rockers, MD  albuterol (PROAIR HFA) 108 (90 Base) MCG/ACT inhaler Inhale 2 puffs into the lungs every 4 (four) hours as needed for wheezing. 01/25/17 01/25/18  Tanda Rockers, MD  Cholecalciferol (VITAMIN D PO) Take 1 capsule by mouth every other day.    [provider]  ciprofloxacin (CIPRO) 250 MG tablet Take 1 tablet (250 mg total) by mouth 2 (two) times daily for 7 days. 03/22/18 03/29/18  Wynona Luna, MD  clotrimazole (MYCELEX) 10 MG troche Take 1 tablet (10 mg total) by mouth 5 (five) times daily. 10/29/17   Renato Shin, MD  metroNIDAZOLE (FLAGYL) 500 MG tablet Take 1 tablet (500 mg total) by mouth 2 (two) times daily. 03/22/18   Wynona Luna, MD  ondansetron (ZOFRAN) 4 MG tablet Take 1 tablet (4 mg total) by mouth every 6 (six) hours. 03/22/18   Wynona Luna, MD    Family History Family History  Problem Relation Age of Onset  . Lung cancer Brother   . Melanoma Brother   . Hypertension Mother   . Stroke Mother   . Other Father        poor circulation  . Atopy Neg Hx   . Asthma Neg  Hx   . Breast cancer Neg Hx     Social History Social History   Tobacco Use  . Smoking status: Former Smoker    Packs/day: 0.30    Years: 5.00    Pack years: 1.50    Types: Cigarettes    Last attempt to quit: 10/02/1986    Years since quitting: 31.4  . Smokeless tobacco: Never Used  Substance Use Topics  . Alcohol use: No  . Drug use: No     Allergies   Aspirin and Ramipril   Review of Systems Review of Systems  All other systems reviewed and are negative.    Physical Exam Triage Vital Signs ED Triage Vitals  Enc Vitals Group     BP 03/22/18 1035 (!) 188/74     Pulse Rate 03/22/18  1035 (!) 57     Resp 03/22/18 1035 20     Temp 03/22/18 1035 98 F (36.7 C)     Temp src --      SpO2 03/22/18 1035 100 %     Weight --      Height --      Pain Score 03/22/18 1032 7     Pain Loc --    Updated Vital Signs BP (!) 188/74   Pulse (!) 57   Temp 98 F (36.7 C)   Resp 20   SpO2 100%  Physical Exam  Constitutional: She is oriented to person, place, and time.  Able to climb onto the exam table, walked into the urgent care independently  HENT:  Head: Atraumatic.  Eyes:  Conjugate gaze observed, no eye redness/discharge  Neck: Neck supple.  Cardiovascular:  Rhythm seems regular but patient does have history A. Fib, HR 50s  Pulmonary/Chest: No respiratory distress. She has no wheezes. She has no rales.  Lungs clear, symmetric breath sounds   Abdominal: Soft. She exhibits no distension. There is no rebound and no guarding.  Tenderness in the left lower quadrant, reproducible.  Moderate.  Musculoskeletal: Normal range of motion.  Neurological: She is alert and oriented to person, place, and time.  Skin: Skin is warm and dry.  Nursing note and vitals reviewed.    UC Treatments / Results  Labs Results for orders placed or performed during the hospital encounter of 03/22/18  CBC  Result Value Ref Range   WBC 7.2 4.0 - 10.5 K/uL   RBC 4.56 3.87 - 5.11 MIL/uL     Hemoglobin 12.0 12.0 - 15.0 g/dL   HCT 35.7 (L) 36.0 - 46.0 %   MCV 78.3 78.0 - 100.0 fL   MCH 26.3 26.0 - 34.0 pg   MCHC 33.6 30.0 - 36.0 g/dL   RDW 16.5 (H) 11.5 - 15.5 %   Platelets 188 150 - 400 K/uL  Comprehensive metabolic panel  Result Value Ref Range   Sodium 141 135 - 145 mmol/L   Potassium 3.9 3.5 - 5.1 mmol/L   Chloride 110 101 - 111 mmol/L   CO2 21 (L) 22 - 32 mmol/L   Glucose, Bld 176 (H) 65 - 99 mg/dL   BUN 22 (H) 6 - 20 mg/dL   Creatinine, Ser 1.88 (H) 0.44 - 1.00 mg/dL   Calcium 9.2 8.9 - 10.3 mg/dL   Total Protein 7.2 6.5 - 8.1 g/dL   Albumin 3.9 3.5 - 5.0 g/dL   AST 23 15 - 41 U/L   ALT 16 14 - 54 U/L   Alkaline Phosphatase 75 38 - 126 U/L   Total Bilirubin 1.2 0.3 - 1.2 mg/dL   GFR calc non Af Amer 24 (L) >60 mL/min   GFR calc Af Amer 28 (L) >60 mL/min   Anion gap 10 5 - 15    EKG None  Radiology EXAM: DG ABDOMEN ACUTE W/ 1V CHEST  COMPARISON: Chest x-ray dated 10/29/2017.  FINDINGS: Single-view of the chest: Stable cardiomegaly. Lungs are stable. No pleural effusion or pneumothorax seen. No acute or suspicious osseous finding.  Supine and upright views of the abdomen: Bowel gas pattern is nonobstructive. No evidence of soft tissue mass or abnormal fluid collection. No evidence of free intraperitoneal air. No evidence of renal or ureteral calculi. No acute or suspicious osseous finding. Degenerative changes throughout the slightly scoliotic thoracolumbar spine, mild to moderate in degree. Additional degenerative changes at the bilateral  hips.  IMPRESSION: 1. No active cardiopulmonary disease. No evidence of pneumonia or pulmonary edema. Stable cardiomegaly. 2. No acute findings within the abdomen or pelvis. Nonobstructive bowel gas pattern. 3. Degenerative changes of the lumbar spine and bilateral hips, mild to moderate in degree.   Electronically Signed By: Franki Cabot M.D. On: 03/22/2018 11:48  Procedures Procedures  (including critical care time)  Medications Ordered in UC Medications - No data to display   Final Clinical Impressions(s) / UC Diagnoses   Final diagnoses:  Acute left lower quadrant pain  Watery diarrhea  Acute and chronic renal insufficiency   Discharge Instructions     Left lower quadrant abdominal pain seems most likely to be due to flareup of diverticulitis today.  Prescriptions ondansetron, for nausea, and antibiotics (cipro, flagyl) were sent to the pharmacy.  Anticipate gradual improvement in pain and diarrhea over the next few days.  Recheck for new fever greater than 100.5, severe/sustained (>1 hr) abdominal pain, blood in stool, or if not improving as expected.  Abdominal xrays today did not suggest constipation.  Labs including blood count, chemistries/liver/kidney tests are pending, the urgent care will contact you if a change in treatment is needed.    ED Prescriptions    Medication Sig Dispense Auth. Provider   ciprofloxacin (CIPRO) 250 MG tablet Take 1 tablet (250 mg total) by mouth 2 (two) times daily for 7 days. 14 tablet Wynona Luna, MD   metroNIDAZOLE (FLAGYL) 500 MG tablet Take 1 tablet (500 mg total) by mouth 2 (two) times daily. 14 tablet Wynona Luna, MD   ondansetron (ZOFRAN) 4 MG tablet Take 1 tablet (4 mg total) by mouth every 6 (six) hours. 12 tablet Wynona Luna, MD        Wynona Luna, MD 03/24/18 [phone]

## 2018-03-22 NOTE — Telephone Encounter (Signed)
I called patient & she went to urgent care this morning.

## 2018-03-22 NOTE — Discharge Instructions (Addendum)
Left lower quadrant abdominal pain seems most likely to be due to flareup of diverticulitis today.  Prescriptions ondansetron, for nausea, and antibiotics (cipro, flagyl) were sent to the pharmacy.  Anticipate gradual improvement in pain and diarrhea over the next few days.  Recheck for new fever greater than 100.5, severe/sustained (>1 hr) abdominal pain, blood in stool, or if not improving as expected.  Abdominal xrays today did not suggest constipation.  Labs including blood count, chemistries/liver/kidney tests are pending, the urgent care will contact you if a change in treatment is needed.

## 2018-03-22 NOTE — Telephone Encounter (Signed)
Patient stated that she has been nauseated x 1 week and diarrhea started yesterday  severe stomach cramps, 8 or more episodes or more  Called [facility]

## 2018-03-25 ENCOUNTER — Ambulatory Visit (HOSPITAL_COMMUNITY)
Admission: EM | Admit: 2018-03-25 | Discharge: 2018-03-25 | Disposition: A | Discharge status: Home / Self Care | Payer: Medicare Other | Attending: Family Medicine | Admitting: Family Medicine

## 2018-03-25 ENCOUNTER — Encounter (HOSPITAL_COMMUNITY): Payer: Self-pay | Admitting: Emergency Medicine

## 2018-03-25 DIAGNOSIS — R609 Edema, unspecified: Secondary | ICD-10-CM

## 2018-03-25 NOTE — Discharge Instructions (Signed)
Complete course of antibiotics as previously prescribed. Elevation of legs for increased swelling, may need to wear compression stockings.  If worsening or persistent please follow up with primary care provider. If develop worsening of abdominal pain, bleeding, weakness, fevers please return or go to Er.

## 2018-03-25 NOTE — ED Provider Notes (Signed)
[place]    CSN: [id number] Arrival date & time: 03/25/18  1006     History   Chief Complaint No chief complaint on file.   HPI [name] is a 82 y.o. female.   [name] presents with complaints of bilateral ankle swelling which occurred yesterday. Started cipro and flagyl for diverticulitis on 6/21 after being seen here. Denies any previous leg swelling. No pain. This morning no longer with swelling. Abdominal symptoms have improved. States had a normal bm today. No abdominal pain or nausea. Noted some bright red blood to stool last night, no further this morning. No urinary symptoms. She is eating and drinking. No fevers. No calf pain. No shortness of breath , chest pain or palpitations. Follows with endocrinology for DM but does not have a PCP. Has been taking her htn meds. Hx of afib and renal insufficiency. Creatinine on 6/21 1.88.     ROS per HPI.      Past Medical History:  Diagnosis Date  . Allergic rhinitis   . Anterior chest wall pain   . Anxiety   . Asthma   . Cough   . DM type 2 (diabetes mellitus, type 2) ([place])   . GERD (gastroesophageal reflux disease)   . History of diverticulitis of colon   . HTN (hypertension)   . Hyperlipidemia   . IBS (irritable bowel syndrome)   . Iron deficiency anemia   . Osteoporosis, unspecified   . Renal insufficiency 07/31/2017  . UTI (urinary tract infection)     Patient Active Problem List   Diagnosis Date Noted  . DOE (dyspnea on exertion) 01/30/2018  . Acute bronchiolitis 12/17/2017  . Wheezing 10/29/2017  . Vitamin D deficiency 07/31/2017  . Renal insufficiency 07/31/2017  . Knee pain 06/01/2017  . Morbid obesity due to excess calories ([place]) 01/25/2017  . Diabetes ([place]) 04/12/2016  . Cough 01/20/2015  . Atrial fibrillation ([place]) 05/28/2014  . Personal history of colonic polyps 05/28/2014  . Vomiting 01/29/2014  . Coronary atherosclerosis of native coronary artery 09/01/2013  . Routine general  medical examination at a health care facility 07/17/2011  . Encounter for long-term (current) use of other medications 07/06/2011  . Nonspecific (abnormal) findings on radiological and other examination of body structure 11/09/2009  . IRRITABLE BOWEL SYNDROME 05/07/2009  . CHEST PAIN 05/07/2009  . ADNEXAL MASS, RIGHT 07/22/2008  . HEMORRHOIDS, RECURRENT 01/27/2008  . DIVERTICULOSIS OF COLON 01/27/2008  . OTH ABNORMAL FIND RAD EXAMINATION BREAST 01/27/2008  . GERD 01/13/2008  . UTI 09/28/2007  . Dyslipidemia 05/27/2007  . ANEMIA-IRON DEFICIENCY 05/27/2007  . ANXIETY 05/27/2007  . Essential hypertension 05/27/2007  . Seasonal allergic rhinitis 05/27/2007  . Cough variant asthma 05/27/2007  . Osteoporosis 05/27/2007  . DIVERTICULITIS, HX OF 05/27/2007    Past Surgical History:  Procedure Laterality Date  . CARDIOVASCULAR STRESS TEST  02/25/04  . ESOPHAGOGASTRODUODENOSCOPY  12/26/01  . RIGHT OOPHORECTOMY  jan 2010    OB History   None      Home Medications    Prior to Admission medications   Medication Sig Start Date End Date Taking? Authorizing Provider  acetaminophen (TYLENOL) 325 MG tablet Take 650 mg by mouth every 6 (six) hours as needed for pain.    [provider]  albuterol (PROAIR HFA) 108 (90 Base) MCG/ACT inhaler Inhale 2 puffs into the lungs every 4 (four) hours as needed for wheezing. 01/25/17 01/25/18  Tanda Rockers, MD  amLODipine (NORVASC) 5 MG tablet Take 1  tablet (5 mg total) by mouth 2 (two) times daily. 06/29/17   Fay Records, MD  Blood Glucose Monitoring Suppl (ONE TOUCH ULTRA 2) W/DEVICE KIT Check blood sugar 3 times per day. 07/24/14   Renato Shin, MD  Cholecalciferol (VITAMIN D PO) Take 1 capsule by mouth every other day.    [provider]  ciprofloxacin (CIPRO) 250 MG tablet Take 1 tablet (250 mg total) by mouth 2 (two) times daily for 7 days. 03/22/18 03/29/18  Wynona Luna, MD  clotrimazole (MYCELEX) 10 MG troche Take 1  tablet (10 mg total) by mouth 5 (five) times daily. 10/29/17   Renato Shin, MD  ELIQUIS 5 MG TABS tablet TAKE 1 TABLET (5 MG TOTAL) BY MOUTH 2 (TWO) TIMES DAILY. 11/20/17   Fay Records, MD  insulin lispro (HUMALOG) 100 UNIT/ML injection 3 times a day (just before each meal) 02-04-24 units, and pen needles 3/day 02/15/18   Renato Shin, MD  Insulin Syringe-Needle U-100 (BD INSULIN SYRINGE ULTRAFINE) 31G X 5/16" 0.3 ML MISC Use as directed     [provider]  losartan-hydrochlorothiazide (HYZAAR) 100-25 MG tablet Take 1 tablet by mouth daily. 02/15/18   Renato Shin, MD  metroNIDAZOLE (FLAGYL) 500 MG tablet Take 1 tablet (500 mg total) by mouth 2 (two) times daily. 03/22/18   Wynona Luna, MD  montelukast (SINGULAIR) 10 MG tablet TAKE 1 TABLET BY MOUTH AT BEDTIME 01/15/18   Renato Shin, MD  ondansetron (ZOFRAN) 4 MG tablet Take 1 tablet (4 mg total) by mouth every 6 (six) hours as needed for nausea or vomiting. 11/09/16   Renato Shin, MD  ondansetron (ZOFRAN) 4 MG tablet Take 1 tablet (4 mg total) by mouth every 6 (six) hours. 03/22/18   Wynona Luna, MD  ONE TOUCH LANCETS MISC Use as directed three times daily     [provider]  potassium chloride (K-DUR) 10 MEQ tablet Take 1 tablet (10 mEq total) by mouth daily. 04/12/16   Renato Shin, MD  pravastatin (PRAVACHOL) 40 MG tablet TAKE 1 TABLET BY MOUTH DAILY 08/20/17   Renato Shin, MD  SYMBICORT 160-4.5 MCG/ACT inhaler INHALE 2 PUFFS INTO THE LUNGS TWO TIMES DAILY 02/11/18   Tanda Rockers, MD    Family History Family History  Problem Relation Age of Onset  . Lung cancer Brother   . Melanoma Brother   . Hypertension Mother   . Stroke Mother   . Other Father        poor circulation  . Atopy Neg Hx   . Asthma Neg Hx   . Breast cancer Neg Hx     Social History Social History   Tobacco Use  . Smoking status: Former Smoker    Packs/day: 0.30    Years: 5.00    Pack years: 1.50    Types: Cigarettes     Last attempt to quit: 10/02/1986    Years since quitting: 31.4  . Smokeless tobacco: Never Used  Substance Use Topics  . Alcohol use: No  . Drug use: No     Allergies   Aspirin and Ramipril   Review of Systems Review of Systems   Physical Exam Triage Vital Signs ED Triage Vitals  Enc Vitals Group     BP 03/25/18 1035 (!) 182/46     Pulse Rate 03/25/18 1034 62     Resp 03/25/18 1034 18     Temp 03/25/18 1034 98.1 F (36.7 C)     Temp src --  SpO2 03/25/18 1034 100 %     Weight --      Height --      Head Circumference --      Peak Flow --      Pain Score 03/25/18 1034 0     Pain Loc --      Pain Edu? --      Excl. in [place]? --    No data found.  Updated Vital Signs BP (!) 182/46   Pulse 62   Temp 98.1 F (36.7 C)   Resp 18   SpO2 100%     Physical Exam  Constitutional: She is oriented to person, place, and time. She appears well-developed and well-nourished. No distress.  Cardiovascular: Normal rate, regular rhythm and normal heart sounds.  Pulmonary/Chest: Effort normal and breath sounds normal.  Abdominal: Soft. There is tenderness in the left lower quadrant.  Still with mild LLQ abdominal pain on palpation  Musculoskeletal:  Bilateral lower extremities without any edema; non tender, no redness; no calf pain or swelling; ambulatory without difficulty.   Neurological: She is alert and oriented to person, place, and time.  Skin: Skin is warm and dry.     UC Treatments / Results  Labs (all labs ordered are listed, but only abnormal results are displayed) Labs Reviewed - No data to display  EKG None  Radiology No results found.  Procedures Procedures (including critical care time)  Medications Ordered in UC Medications - No data to display  Initial Impression / Assessment and Plan / UC Course  I have reviewed the triage vital signs and the nursing notes.  Pertinent labs & imaging results that were available during my care of the patient  were reviewed by me and considered in my medical decision making (see chart for details).     Swelling resolved this morning. Encouraged elevation as needed, may need compression stockings. Abdominal symptoms improving, continue with antibiotics as prescribed. Encouraged establish with PCP for recheck of symptoms and management if needed. Return precautions provided. Patient verbalized understanding and agreeable to plan.  Ambulatory out of clinic without difficulty.    Final Clinical Impressions(s) / UC Diagnoses   Final diagnoses:  Peripheral edema     Discharge Instructions     Complete course of antibiotics as previously prescribed. Elevation of legs for increased swelling, may need to wear compression stockings.  If worsening or persistent please follow up with primary care provider. If develop worsening of abdominal pain, bleeding, weakness, fevers please return or go to Er.    ED Prescriptions    None     Controlled Substance Prescriptions [place] Controlled Substance Registry consulted? Not Applicable   Zigmund Gottron, NP 03/25/18 1116

## 2018-03-25 NOTE — ED Triage Notes (Signed)
Pt states she was given antibiotics a few days ago after been seen, states she feels like her ankles are swollen and is concerned about the side effects.

## 2018-03-28 ENCOUNTER — Telehealth: Payer: Self-pay | Admitting: Endocrinology

## 2018-03-28 DIAGNOSIS — K5792 Diverticulitis of intestine, part unspecified, without perforation or abscess without bleeding: Secondary | ICD-10-CM

## 2018-03-28 NOTE — Telephone Encounter (Signed)
Patient is asking for a referral to see Dr Earlean Shawl GI doctor. Please advise

## 2018-03-28 NOTE — Telephone Encounter (Signed)
Patient needs referral for diverticulitis. She stated that she had to go to the ER & she is having another flare up.

## 2018-03-28 NOTE — Telephone Encounter (Signed)
done

## 2018-04-08 ENCOUNTER — Other Ambulatory Visit: Payer: Self-pay | Admitting: Endocrinology

## 2018-04-11 ENCOUNTER — Ambulatory Visit: Payer: Medicare Other | Admitting: Internal Medicine

## 2018-04-22 ENCOUNTER — Encounter: Payer: Self-pay | Admitting: Physician Assistant

## 2018-04-22 ENCOUNTER — Encounter (INDEPENDENT_AMBULATORY_CARE_PROVIDER_SITE_OTHER): Payer: Self-pay

## 2018-04-22 ENCOUNTER — Ambulatory Visit: Payer: Medicare Other | Admitting: Physician Assistant

## 2018-04-22 VITALS — BP 154/70 | HR 51 | Ht 67.0 in | Wt 196.0 lb

## 2018-04-22 DIAGNOSIS — I10 Essential (primary) hypertension: Secondary | ICD-10-CM

## 2018-04-22 DIAGNOSIS — I2510 Atherosclerotic heart disease of native coronary artery without angina pectoris: Secondary | ICD-10-CM | POA: Diagnosis not present

## 2018-04-22 DIAGNOSIS — I482 Chronic atrial fibrillation: Secondary | ICD-10-CM | POA: Diagnosis not present

## 2018-04-22 DIAGNOSIS — E785 Hyperlipidemia, unspecified: Secondary | ICD-10-CM

## 2018-04-22 DIAGNOSIS — R911 Solitary pulmonary nodule: Secondary | ICD-10-CM

## 2018-04-22 DIAGNOSIS — I4821 Permanent atrial fibrillation: Secondary | ICD-10-CM

## 2018-04-22 NOTE — Patient Instructions (Addendum)
Medication Instructions:  1. Your physician recommends that you continue on your current medications as directed. Please refer to the Current Medication list given to you today.   Labwork: BMET TO BE DONE IN 6 WEEKS; SAME DAY YOU COME IN FOR APPT WITH THE HYPERTENSION CLINIC  Testing/Procedures: NONE ORDERED TODAY  Follow-Up: DR. Harrington Challenger IN 6 MONTHS   YOU ARE BEING REFERRED TO THE HYPERTENSION CLINIC; APPT TO BE IN 6 WEEKS SAME DAY WITH LAB WORK TO BE DONE; MAKE SURE TO TAKE YOUR BLOOD PRESSURE MEDICATIONS 1-2 HOURS BEFORE APPT   Any Other Special Instructions Will Be Listed Below (If Applicable).     If you need a refill on your cardiac medications before your next appointment, please call your pharmacy.

## 2018-04-22 NOTE — Progress Notes (Signed)
Cardiology Office Note:    Date:  04/22/2018   ID:  [name], DOB July 10, 1936, MRN [id number]  PCP:  Renato Shin, MD  Cardiologist:  Dorris Carnes, MD   Referring MD: Renato Shin, MD   Chief Complaint  Patient presents with  . Follow-up    CAD, AFib    History of Present Illness:    [name] is a 82 y.o. female with permanent atrial fibrillation, coronary artery disease, diabetes, hypertension, hyperlipidemia, lung nodule.  She had an abnormal nuclear stress test in October 2017.  Coronary CTA demonstrated moderate CAD in the proximal and mid LAD and possible severe stenosis in the ostial D2.  FFR was reportedly not hemodynamically significant.  Medical therapy was continued.  CT did demonstrate several pulmonary nodules.  A follow-up CT in November 2018 demonstrated that the nodules were stable and a repeat is due in November 2019.  Last seen by Dr. Harrington Challenger 9/18.    [name] returns for follow-up.  She is here alone.  She had a bout of diverticulitis last month with some noticeable bleeding.  However, this has resolved.  She denies any other bleeding issues.  She denies chest discomfort, orthopnea, PND.  She has some lower extremity swelling without any significant change.  She denies syncope.  She does get short of breath.  This is chronic without change.  She relates her shortness of breath to her asthma.  Prior CV studies:   The following studies were reviewed today:  Coronary CTA 08/11/2016 IMPRESSION: 1. Coronary calcium score of 51. This was 45 percentile for age and sex matched control. 2. Normal coronary origin with right dominance. 3. There is moderate CAD in the proximal and mid LAD and possible severe stenosis in the ostial D2. We will obtain additional CT FFR Date. (Reportedly not hemodynamically significant).  4. Left atrial appendage is large with a filling defect in its distal portion, this might represent poor contrast mixing, however thrombus can't be  excluded. 5. Mitral annular calcifications.  IMPRESSION: 1. Pulmonary artery enlargement suggests pulmonary arterial hypertension. 2. Hiatal hernia. Distal esophageal wall thickening suggests esophagitis. 3. Right middle lobe 4 mm pulmonary nodule. No follow-up needed if patient is low-risk. Non-contrast chest CT can be considered in 12 months if patient is high-risk. This recommendation follows the consensus statement: Guidelines for Management of Incidental Pulmonary Nodules Detected on CT Images: From the Fleischner Society 2017; Radiology 2017; 284:228-243. 4. Right breast nodule was similar in size in 2012. Although technically indeterminate, this favors a benign lesion.  Nuclear stress test 07/21/2016 EF 73, anterior ischemia, intermediate risk  Echo 09/15/14 Mild LVH, EF 65-70, no RWMA, Gr 2 DD, mod to severe MAC, mild MS (mean 6), severe LAE, mild RV dilation, normal RVSF, PASP 39 (mild pulmonary HTN)  Past Medical History:  Diagnosis Date  . Allergic rhinitis   . Anterior chest wall pain   . Anxiety   . Asthma   . Cough   . DM type 2 (diabetes mellitus, type 2) ([place])   . GERD (gastroesophageal reflux disease)   . History of diverticulitis of colon   . HTN (hypertension)   . Hyperlipidemia   . IBS (irritable bowel syndrome)   . Iron deficiency anemia   . Osteoporosis, unspecified   . Renal insufficiency 07/31/2017  . UTI (urinary tract infection)    Surgical Hx: The patient  has a past surgical history that includes Esophagogastroduodenoscopy (12/26/01); Cardiovascular stress test (02/25/04); and Right oophorectomy (jan  2010).   Current Medications: No outpatient medications have been marked as taking for the 04/22/18 encounter (Office Visit) with Richardson Dopp T, PA-C.     Allergies:   Aspirin and Ramipril   Social History   Tobacco Use  . Smoking status: Former Smoker    Packs/day: 0.30    Years: 5.00    Pack years: 1.50    Types: Cigarettes    Last  attempt to quit: 10/02/1986    Years since quitting: 31.5  . Smokeless tobacco: Never Used  Substance Use Topics  . Alcohol use: No  . Drug use: No     Family Hx: The patient's family history includes Hypertension in her mother; Lung cancer in her brother; Melanoma in her brother; Other in her father; Stroke in her mother. There is no history of Atopy, Asthma, or Breast cancer.  ROS:   Please see the history of present illness.    ROS All other systems reviewed and are negative.   EKGs/Labs/Other Test Reviewed:    EKG:  EKG is  ordered today.  The ekg ordered today demonstrates atrial fibrillation, heart rate 51, right bundle branch block, similar to prior tracing dated 08/21/2014 and 06/01/2017  Recent Labs: 05/02/2017: Magnesium 1.4 06/01/2017: TSH 3.22 03/22/2018: ALT 16; BUN 22; Creatinine, Ser 1.88; Hemoglobin 12.0; Platelets 188; Potassium 3.9; Sodium 141   From KPN Tool: Cholesterol, total  129.000  06/01/2017 HDL    49.300  06/01/2017 LDL    64.000  06/01/2017 Triglycerides  77.000  06/01/2017 A1C    6.000   02/15/2018 Hemoglobin   12.000  03/22/2018 Creatinine, Serum  1.880   03/22/2018 Potassium   3.900   03/22/2018 ALT (SGPT)   16.000  03/22/2018 TSH    3.220   06/01/2017 Platelets   188.000  03/22/2018   Recent Lipid Panel Lab Results  Component Value Date/Time   CHOL 129 06/01/2017 09:08 AM   TRIG 77.0 06/01/2017 09:08 AM   TRIG 83 08/07/2006 02:19 PM   HDL 49.30 06/01/2017 09:08 AM   CHOLHDL 3 06/01/2017 09:08 AM   LDLCALC 64 06/01/2017 09:08 AM    Physical Exam:    VS:  BP (!) 154/70   Pulse (!) 51   Ht 5' 7"  (1.702 m)   Wt 196 lb (88.9 kg)   SpO2 98%   BMI 30.70 kg/m     Wt Readings from Last 3 Encounters:  04/22/18 196 lb (88.9 kg)  02/15/18 200 lb 3.2 oz (90.8 kg)  01/28/18 200 lb (90.7 kg)     Physical Exam  Constitutional: She is oriented to person, place, and time. She appears well-developed and well-nourished. No distress.  HENT:  Head:  Normocephalic and atraumatic.  Eyes: No scleral icterus.  Neck: Neck supple. No JVD present.  Cardiovascular: S1 normal, S2 normal and normal heart sounds. An irregularly irregular rhythm present. Bradycardia present.  No murmur heard. Pulmonary/Chest: Effort normal. She has no rales.  Abdominal: Soft. There is no hepatomegaly.  Musculoskeletal: She exhibits no edema.  Neurological: She is alert and oriented to person, place, and time.  Skin: Skin is warm and dry.  Psychiatric: She has a normal mood and affect.    ASSESSMENT & PLAN:    Coronary artery disease involving native coronary artery of native heart without angina pectoris Moderate CAD noted on coronary CTA in 2017.  FFR was negative.  She is not on aspirin as she is on Apixaban.  She denies anginal symptoms.  Permanent atrial  fibrillation ([place]) Rate is controlled.  She remains on Apixaban.  Of note, creatinine in June was 1.88.  She will turn 82 in August.  I will obtain a BMET in about 4 to 6 weeks.  If her creatinine remains above 1.6, we will need to reduce her dose of Apixaban to 2.5 mg twice daily.  Essential hypertension  Blood pressure above target.  She has not take any medications yet today.  I will have her follow-up with the hypertension clinic in 4 to 6 weeks.  I have asked her to take her medications prior to that visit.  We may need to consider changing her amlodipine to 10 mg once daily instead of 5 mg twice daily.  Lung nodule She is scheduled for a follow-up CT in November 2019.  Hyperlipidemia, unspecified hyperlipidemia type LDL optimal on most recent lab work.  Continue current Rx.     Dispo:  Return in about 6 months (around 10/23/2018) for Routine Follow Up, w/ Dr. Harrington Challenger.   Medication Adjustments/Labs and Tests Ordered: Current medicines are reviewed at length with the patient today.  Concerns regarding medicines are outlined above.  Tests Ordered: Orders Placed This Encounter  Procedures  . Basic  Metabolic Panel (BMET)  . EKG 12-Lead   Medication Changes: No orders of the defined types were placed in this encounter.   Signed, Richardson Dopp, PA-C  04/22/2018 1:21 PM    [facility] [address], [place], [place]  [address] Phone: [phone]; Fax: [phone]

## 2018-04-29 ENCOUNTER — Ambulatory Visit: Payer: Medicare Other | Admitting: Internal Medicine

## 2018-04-29 ENCOUNTER — Encounter: Payer: Self-pay | Admitting: Internal Medicine

## 2018-04-29 VITALS — BP 152/70 | HR 63 | Ht 65.5 in | Wt 194.8 lb

## 2018-04-29 DIAGNOSIS — J45991 Cough variant asthma: Secondary | ICD-10-CM | POA: Diagnosis not present

## 2018-04-29 NOTE — Progress Notes (Signed)
Subjective:     Patient ID: [name], female   DOB: 01-Feb-1936 .   MRN: [id number]   Brief patient profile:  82  yobf quit smoking 1988 with h/o asthma  And nl baseline pfts ( as of 01/04/2010) who had been using Advair on a p.r.n. basis and noticing increasing dyspnea and need for rescue therapy x one mo when seen 01/06/08 for pulmonary evaluation so Advair stopped. Began Symbicort 160/4.28mg 2 puffs two times a day.  Returned 02/13/08 improved with decreased dyspnea and no rescue use   Returned 03/18/08 symptom free no rescue needed, stopped reglan, no flare    01/25/2017  f/u ov/Wert re: asthma / ? Allergic rhinitis  rx symb160/singulair  Chief Complaint  Patient presents with  . Follow-up    Last seen 2015. She c/o minimal wheezing for the past wk "b/c of the pollen". She also has minimal dry cough.   She is using proair 2 x per wk on average.   only uses alb if overdoes it - never noct needed Doe = MMRC2 = can't walk a nl pace on a flat grade s sob but does fine slow and flat eg shopping rec Prednisone 10 mg take  4 each am x 2 days,   2 each am x 2 days,  1 each am x 2 days and stop  For cough > mucinex dm up to 1200 mg every 12 hours and add Try prilosec otc 230m Take 30-60 min before first meal of the day and Pepcid ac (famotidine) 20 mg one @  bedtime until cough is completely gone for at least a week without the need for cough suppression GERD diet   01/28/2018  f/u ov/Wert re:  Asthma  Yearly f/u maiint on symb 160 2bid and singulair  Chief Complaint  Patient presents with  . Follow-up    Reports increased sneezing and dry cough with increase of pollen.   Dyspnea:  MMRC2 = can't walk a nl pace on a flat grade s sob but does fine slow and flat walking at mall  Cough: correlates with sneezing outdoors only Sleep: no resp symptoms SABA use:  Maybe twice a week  rec For itching/ sneezing > zyrtec 10 mg one at bedtime (over the counter) Work on inhaler technique:   Keep walking  regularly and see if using your albuterol 15 min before helps you do more  Or not> usually does not.   04/29/2018  f/u ov/Wert re:  Asthma / rhinitis improved with prn zyrtec maint symb/singulair Chief Complaint  Patient presents with  . Follow-up    Pt states things have been doing good since last visit. Pt has had to use her rescue inhaler but states she has not had to use it that often and when she has used it, she states it is only used once in a day's time.    Dyspnea:  MMRC2 = can't walk a nl pace on a flat grade s sob but does fine slow and flat  Cough: varies daytime mostly and dry   SABA use: before exertion sometimes  02: none    No obvious day to day or daytime variability or assoc excess/ purulent sputum or mucus plugs or hemoptysis or cp or chest tightness, subjective wheeze or overt sinus or hb symptoms.   Sleeping: either side horizontal bed/ one pillow  without nocturnal  or early am exacerbation  of respiratory  c/o's or need for noct saba. Also denies any  obvious fluctuation of symptoms with weather or environmental changes or other aggravating or alleviating factors except as outlined above   No unusual exposure hx or h/o childhood pna/ asthma or knowledge of premature birth.  Current Allergies, Complete Past Medical History, Past Surgical History, Family History, and Social History were reviewed in [employer] record.  ROS  The following are not active complaints unless bolded Hoarseness, sore throat, dysphagia, dental problems, itching, sneezing,  nasal congestion or discharge of excess mucus or purulent secretions, ear ache,   fever, chills, sweats, unintended wt loss or wt gain, classically pleuritic or exertional cp,  orthopnea pnd or arm/hand swelling  or leg swelling, presyncope, palpitations, abdominal pain, anorexia, nausea, vomiting, diarrhea  or change in bowel habits or change in bladder habits, change in stools or change in urine, dysuria,  hematuria,  rash, arthralgias, visual complaints, headache, numbness, weakness or ataxia or problems with walking or coordination,  change in mood or  memory.        Current Meds  Medication Sig  . acetaminophen (TYLENOL) 325 MG tablet Take 650 mg by mouth every 6 (six) hours as needed for pain.  Marland Kitchen albuterol (PROAIR HFA) 108 (90 Base) MCG/ACT inhaler Inhale 2 puffs into the lungs every 4 (four) hours as needed for wheezing or shortness of breath.  Marland Kitchen amLODipine (NORVASC) 5 MG tablet Take 1 tablet (5 mg total) by mouth 2 (two) times daily.  . Blood Glucose Monitoring Suppl (ONE TOUCH ULTRA 2) W/DEVICE KIT Check blood sugar 3 times per day.  . Cholecalciferol (VITAMIN D PO) Take 1 capsule by mouth every other day.  . clotrimazole (MYCELEX) 10 MG troche Take 1 tablet (10 mg total) by mouth 5 (five) times daily.  Marland Kitchen ELIQUIS 5 MG TABS tablet TAKE 1 TABLET (5 MG TOTAL) BY MOUTH 2 (TWO) TIMES DAILY.  Marland Kitchen insulin lispro (HUMALOG) 100 UNIT/ML injection 3 times a day (just before each meal) 02-04-24 units, and pen needles 3/day  . Insulin Syringe-Needle U-100 (BD INSULIN SYRINGE ULTRAFINE) 31G X 5/16" 0.3 ML MISC Use as directed   . losartan-hydrochlorothiazide (HYZAAR) 100-25 MG tablet Take 1 tablet by mouth daily.  . metroNIDAZOLE (FLAGYL) 500 MG tablet Take 1 tablet (500 mg total) by mouth 2 (two) times daily.  . montelukast (SINGULAIR) 10 MG tablet TAKE 1 TABLET BY MOUTH AT BEDTIME  . ondansetron (ZOFRAN) 4 MG tablet Take 1 tablet (4 mg total) by mouth every 6 (six) hours as needed for nausea or vomiting.  . ONE TOUCH LANCETS MISC Use as directed three times daily   . potassium chloride (K-DUR) 10 MEQ tablet Take 1 tablet (10 mEq total) by mouth daily.  . pravastatin (PRAVACHOL) 40 MG tablet TAKE 1 TABLET BY MOUTH DAILY  . SYMBICORT 160-4.5 MCG/ACT inhaler INHALE 2 PUFFS INTO THE LUNGS TWO TIMES DAILY  . [DISCONTINUED] ondansetron (ZOFRAN) 4 MG tablet Take 1 tablet (4 mg total) by mouth every 6 (six)  hours.                   Past Medical History:   OSTEOPOROSIS (ICD-733.00)  HYPERTENSION (ICD-401.9)  HYPERLIPIDEMIA (ICD-272.4)  DIVERTICULITIS, HX OF (ICD-V12.79)  DIABETES MELLITUS, TYPE II (ICD-250.00)  COLON CANCER, HX OF (ICD-V10.05)  ASTHMA (ICD-493.90)  -PFT's 03/18/08 minimal airflow obstruction  -PFT's January 04, 2010 No sign airflow obstruction  -Mastered HFA technique August 13, 2008 > confimed November 23, 2009  ANXIETY (ICD-300.00)  ANEMIA-IRON DEFICIENCY (ICD-280.9)  ALLERGIC RHINITIS (ICD-477.9)  IBS  GERD            Objective:   Physical Exam   wt 202 November 23, 2009> 199 January 04, 2010 > 209  03/22/11 > 214 10/16/2013 >  01/25/2017   193> 01/28/2018   200 > 04/29/2018   194     Vital signs reviewed - Note on arrival 02 sats  100% on RA     amb pleasant obese bf nad   HEENT: nl dentition, turbinates bilaterally, and oropharynx. Nl external ear canals without cough reflex   NECK :  without JVD/Nodes/TM/ nl carotid upstrokes bilaterally   LUNGS: no acc muscle use,  Nl contour chest which is clear to A and P bilaterally without cough on insp or exp maneuvers   CV:  RRR  no s3 or murmur or increase in P2, and no edema   ABD:  Obese / soft and nontender with nl inspiratory excursion in the supine position. No bruits or organomegaly appreciated, bowel sounds nl  MS:  Nl gait/ ext warm without deformities, calf tenderness, cyanosis or clubbing No obvious joint restrictions   SKIN: warm and dry without lesions    NEURO:  alert, approp, nl sensorium with  no motor or cerebellar deficits apparent.             Assessment:

## 2018-04-29 NOTE — Patient Instructions (Addendum)
Work on Doctor, hospital technique:  relax and gently blow all the way out then take a nice smooth deep breath back in, triggering the inhaler at same time you start breathing in.  Hold for up to 5 seconds if you can. Blow out thru nose. Rinse and gargle with water when done    Please schedule a follow up visit in 6 months but call sooner if needed

## 2018-05-03 ENCOUNTER — Encounter: Payer: Self-pay | Admitting: Internal Medicine

## 2018-05-03 NOTE — Assessment & Plan Note (Signed)
-  PFT's 03/18/08 minimal airflow obstruction  -PFT's January 04, 2010 No sign airflow obstruction   - FENO 01/25/2017  =   23 p am symb 160 x 2 pffs - Spirometry 01/25/2017  wnl x for min curvature on f/v p am symb 160 x 2 / no saba prior - 07/30/2017   After extensive coaching HFA effectiveness =    75% from baseline 50%  - Allergy profile 07/30/17  >  Eos 0.2 /  IgE  145  RAST pos mold  - FENO 01/28/2018  =   17 - Spirometry 01/28/2018  FEV1 1.22 (65%)  Ratio 79   - 04/29/2018  After extensive coaching inhaler device  effectiveness =    75%    Most  goals of chronic asthma control met including optimal function and elimination of symptoms with minimal need for rescue therapy though still breaking the rule of 2s and might do better if really mastered hfa more effectively   Contingencies discussed in full including contacting this office immediately if not controlling the symptoms      I had an extended discussion with the patient reviewing all relevant studies completed to date and  lasting 15 to 20 minutes of a 25 minute visit    See device teaching which extended face to face time for this visit.  Each maintenance medication was reviewed in detail including emphasizing most importantly the difference between maintenance and prns and under what circumstances the prns are to be triggered using an action plan format that is not reflected in the computer generated alphabetically organized AVS which I have not found useful in most complex patients, especially with respiratory illnesses  Please see AVS for specific instructions unique to this visit that I personally wrote and verbalized to the the pt in detail and then reviewed with pt  by my nurse highlighting any  changes in therapy recommended at today's visit to their plan of care.

## 2018-05-17 ENCOUNTER — Ambulatory Visit
Admission: RE | Admit: 2018-05-17 | Discharge: 2018-05-17 | Disposition: A | Discharge status: Home / Self Care | Payer: Medicare Other | Source: Ambulatory Visit | Attending: Endocrinology | Admitting: Endocrinology

## 2018-05-17 ENCOUNTER — Encounter: Payer: Self-pay | Admitting: Endocrinology

## 2018-05-17 ENCOUNTER — Ambulatory Visit: Payer: Medicare Other | Admitting: Endocrinology

## 2018-05-17 VITALS — BP 140/68 | HR 86 | Temp 98.6°F | Ht 67.0 in | Wt 191.0 lb

## 2018-05-17 DIAGNOSIS — R059 Cough, unspecified: Secondary | ICD-10-CM

## 2018-05-17 DIAGNOSIS — R05 Cough: Secondary | ICD-10-CM

## 2018-05-17 DIAGNOSIS — N183 Chronic kidney disease, stage 3 unspecified: Secondary | ICD-10-CM

## 2018-05-17 DIAGNOSIS — E1122 Type 2 diabetes mellitus with diabetic chronic kidney disease: Secondary | ICD-10-CM

## 2018-05-17 DIAGNOSIS — Z794 Long term (current) use of insulin: Secondary | ICD-10-CM | POA: Diagnosis not present

## 2018-05-17 LAB — POCT GLYCOSYLATED HEMOGLOBIN (HGB A1C): HEMOGLOBIN A1C: 6.1 % — AB (ref 4.0–5.6)

## 2018-05-17 MED ORDER — INSULIN LISPRO 100 UNIT/ML ~~LOC~~ SOLN: SUBCUTANEOUS | 6 refills | Status: DC

## 2018-05-17 MED ORDER — PROMETHAZINE-DM 6.25-15 MG/5ML PO SYRP: 5.0000 mL | ORAL_SOLUTION | Freq: Four times a day (QID) | ORAL | 0 refills | Status: DC | PRN

## 2018-05-17 MED ORDER — AZITHROMYCIN 500 MG PO TABS: 500.0000 mg | ORAL_TABLET | Freq: Every day | ORAL | 0 refills | Status: DC

## 2018-05-17 NOTE — Progress Notes (Signed)
Subjective:    Patient ID: [name], female    DOB: 22-May-1936, 82 y.o.   MRN: [id number]  HPI Pt returns for f/u of diabetes mellitus: DM type: Insulin-requiring type 2 Dx'ed: 3532 Complications: renal insuff Therapy: insulin since 2008 GDM: never DKA: never Severe hypoglycemia: never Pancreatitis: never.  Other: she takes multiple daily injections.   Interval history: no cbg record, but states cbg is lowest in the afternoon.  She denies hypoglycemia.  Pt reports 1 week of moderate prod-quality cough in the chest, and assoc wheezing.   Past Medical History:  Diagnosis Date  . Allergic rhinitis   . Anterior chest wall pain   . Anxiety   . Asthma   . Cough   . DM type 2 (diabetes mellitus, type 2) ([place])   . GERD (gastroesophageal reflux disease)   . History of diverticulitis of colon   . HTN (hypertension)   . Hyperlipidemia   . IBS (irritable bowel syndrome)   . Iron deficiency anemia   . Osteoporosis, unspecified   . Renal insufficiency 07/31/2017  . UTI (urinary tract infection)     Past Surgical History:  Procedure Laterality Date  . CARDIOVASCULAR STRESS TEST  02/25/04  . ESOPHAGOGASTRODUODENOSCOPY  12/26/01  . RIGHT OOPHORECTOMY  jan 2010    Social History   Socioeconomic History  . Marital status: Widowed    Spouse name: Not on file  . Number of children: Not on file  . Years of education: Not on file  . Highest education level: Not on file  Occupational History  . Occupation: retired  Scientific laboratory technician  . Financial resource strain: Not on file  . Food insecurity:    Worry: Not on file    Inability: Not on file  . Transportation needs:    Medical: Not on file    Non-medical: Not on file  Tobacco Use  . Smoking status: Former Smoker    Packs/day: 0.30    Years: 5.00    Pack years: 1.50    Types: Cigarettes    Last attempt to quit: 10/02/1986    Years since quitting: 31.6  . Smokeless tobacco: Never Used  Substance and Sexual Activity  . Alcohol  use: No  . Drug use: No  . Sexual activity: Not on file  Lifestyle  . Physical activity:    Days per week: Not on file    Minutes per session: Not on file  . Stress: Not on file  Relationships  . Social connections:    Talks on phone: Not on file    Gets together: Not on file    Attends religious service: Not on file    Active member of club or organization: Not on file    Attends meetings of clubs or organizations: Not on file    Relationship status: Not on file  . Intimate partner violence:    Fear of current or ex partner: Not on file    Emotionally abused: Not on file    Physically abused: Not on file    Forced sexual activity: Not on file  Other Topics Concern  . Not on file  Social History Narrative  . Not on file    Current Outpatient Medications on File Prior to Visit  Medication Sig Dispense Refill  . acetaminophen (TYLENOL) 325 MG tablet Take 650 mg by mouth every 6 (six) hours as needed for pain.    Marland Kitchen albuterol (PROAIR HFA) 108 (90 Base) MCG/ACT inhaler Inhale 2 puffs  into the lungs every 4 (four) hours as needed for wheezing or shortness of breath.    Marland Kitchen amLODipine (NORVASC) 5 MG tablet Take 1 tablet (5 mg total) by mouth 2 (two) times daily. 180 tablet 3  . Blood Glucose Monitoring Suppl (ONE TOUCH ULTRA 2) W/DEVICE KIT Check blood sugar 3 times per day. 1 each 0  . ELIQUIS 5 MG TABS tablet TAKE 1 TABLET (5 MG TOTAL) BY MOUTH 2 (TWO) TIMES DAILY. 60 tablet 5  . Insulin Syringe-Needle U-100 (BD INSULIN SYRINGE ULTRAFINE) 31G X 5/16" 0.3 ML MISC Use as directed     . losartan-hydrochlorothiazide (HYZAAR) 100-25 MG tablet Take 1 tablet by mouth daily. 90 tablet 3  . metroNIDAZOLE (FLAGYL) 500 MG tablet Take 1 tablet (500 mg total) by mouth 2 (two) times daily. 14 tablet 0  . montelukast (SINGULAIR) 10 MG tablet TAKE 1 TABLET BY MOUTH AT BEDTIME 90 tablet 0  . ONE TOUCH LANCETS MISC Use as directed three times daily     . potassium chloride (K-DUR) 10 MEQ tablet Take 1  tablet (10 mEq total) by mouth daily. 30 tablet 11  . pravastatin (PRAVACHOL) 40 MG tablet TAKE 1 TABLET BY MOUTH DAILY 90 tablet 3  . SYMBICORT 160-4.5 MCG/ACT inhaler INHALE 2 PUFFS INTO THE LUNGS TWO TIMES DAILY 10.2 Inhaler 11  . albuterol (PROAIR HFA) 108 (90 Base) MCG/ACT inhaler Inhale 2 puffs into the lungs every 4 (four) hours as needed for wheezing. 1 Inhaler 1  . Cholecalciferol (VITAMIN D PO) Take 1 capsule by mouth every other day.     No current facility-administered medications on file prior to visit.     Allergies  Allergen Reactions  . Aspirin   . Ramipril     REACTION: Cough    Family History  Problem Relation Age of Onset  . Lung cancer Brother   . Melanoma Brother   . Hypertension Mother   . Stroke Mother   . Other Father        poor circulation  . Atopy Neg Hx   . Asthma Neg Hx   . Breast cancer Neg Hx     BP 140/68   Pulse 86   Temp 98.6 F (37 C)   Ht _0  (1.702 m)   Wt 191 lb (86.6 kg)   BMI 29.91 kg/m   Review of Systems Denies fever and sob.  She has lost 9 lbs since last ov.      Objective:   Physical Exam VITAL SIGNS:  See vs page GENERAL: no distress head: no deformity  eyes: no periorbital swelling, no proptosis  external nose and ears are normal  mouth: no lesion seen Both eac's and tm's are normal.   LUNGS:  Clear to auscultation    Lab Results  Component Value Date   HGBA1C 6.1 (A) 05/17/2018      Assessment & Plan:  Insulin-requiring type 2 DM, with renal insuff: overcontrolled.  Cough: new, prob due to URI Weight loss, new, uncertain etiology   Patient Instructions  Please stop taking the insulin at lunch. check your blood sugar twice a day.  vary the time of day when you check, between before the 3 meals, and at bedtime.  also check if you have symptoms of your blood sugar being too high or too low.  please keep a record of the readings and bring it to your next appointment here (or you can bring the meter  itself).  You can write  it on any piece of paper.  please call us sooner if your blood sugar goes below 70, or if you have a lot of readings over 200. I have sent 2 prescriptions to your pharmacy: antibiotic and cough syrup. A chest x-ray is requested for you today.  We'll let you know about the results.  Please come back for a follow-up appointment in 2-3 weeks, to recheck the weight loss.

## 2018-05-17 NOTE — Patient Instructions (Addendum)
Please stop taking the insulin at lunch. check your blood sugar twice a day.  vary the time of day when you check, between before the 3 meals, and at bedtime.  also check if you have symptoms of your blood sugar being too high or too low.  please keep a record of the readings and bring it to your next appointment here (or you can bring the meter itself).  You can write it on any piece of paper.  please call us sooner if your blood sugar goes below 70, or if you have a lot of readings over 200. I have sent 2 prescriptions to your pharmacy: antibiotic and cough syrup. A chest x-ray is requested for you today.  We'll let you know about the results.  Please come back for a follow-up appointment in 2-3 weeks, to recheck the weight loss.

## 2018-06-04 ENCOUNTER — Ambulatory Visit (INDEPENDENT_AMBULATORY_CARE_PROVIDER_SITE_OTHER): Payer: Medicare Other | Admitting: Pharmacist

## 2018-06-04 ENCOUNTER — Encounter: Payer: Self-pay | Admitting: Pharmacist

## 2018-06-04 ENCOUNTER — Other Ambulatory Visit: Payer: Medicare Other | Admitting: *Deleted

## 2018-06-04 VITALS — BP 128/66 | HR 57

## 2018-06-04 DIAGNOSIS — I10 Essential (primary) hypertension: Secondary | ICD-10-CM | POA: Diagnosis not present

## 2018-06-04 LAB — BASIC METABOLIC PANEL
BUN/Creatinine Ratio: 14 (ref 12–28)
BUN: 22 mg/dL (ref 8–27)
CALCIUM: 9.3 mg/dL (ref 8.7–10.3)
CHLORIDE: 107 mmol/L — AB (ref 96–106)
CO2: 15 mmol/L — AB (ref 20–29)
Creatinine, Ser: 1.57 mg/dL — ABNORMAL HIGH (ref 0.57–1.00)
GFR calc Af Amer: 36 mL/min/{1.73_m2} — ABNORMAL LOW (ref >59)
GFR, EST NON AFRICAN AMERICAN: 31 mL/min/{1.73_m2} — AB (ref >59)
GLUCOSE: 187 mg/dL — AB (ref 65–99)
Potassium: 3.8 mmol/L (ref 3.5–5.2)
Sodium: 140 mmol/L (ref 134–144)

## 2018-06-04 NOTE — Progress Notes (Signed)
Patient ID: [name]                 DOB: Sep 03, 1936                      MRN: [id number]     HPI: [name] is a 82 y.o. female patient of Dr. Harrington Challenger who presents today for hypertension evaluation. PMH significant for permanent atrial fibrillation, coronary artery disease, diabetes, hypertension, and hyperlipidemia. She was recently seen by Richardson Dopp, PA and her pressure was elevated secondary to missed doses.   She presents today for BP management. She reports swelling that started a few days ago. This swelling has come and gone over the last few years (most recently in the spring). She states it is most prominent when she is on her feet a lot. It does get better when she raises her feet or sleeps.   She uses albuterol about 3 times per week. She denies chest pain, SOB, dizziness, and headache. Did take her medications as recommended this morning. She states she is not always consistent with taking her medications the same time each day.   Current HTN meds:  Amlodipine 62m BID Losartan/HCTZ 100/256mdaily   Previously tried: ramipril (cough)  BP goal: <140/90 (age)  Family History: Hypertension in her mother; Lung cancer in her brother; Melanoma in her brother; Other in her father; Stroke in her mother. There is no history of Atopy, Asthma, or Breast cancer.  Social History: former smoker, denies alcohol.   Diet: Eats most prepared from home. She does not cook with salt or seasonings. She occasionally adds salt to her food. She does eat vegetables regularly. Most meals prepared baked. She drinks water mostly. She does drink some diet mt dew or juice. She has one can of mt dew per day.   Exercise: She walks to the mailbox. She walks at the mall about twice per month.   Home BP readings: she does not have a blood pressure cuff at home.   Wt Readings from Last 3 Encounters:  05/17/18 191 lb (86.6 kg)  04/29/18 194 lb 12.8 oz (88.4 kg)  04/22/18 196 lb (88.9 kg)   BP Readings from  Last 3 Encounters:  06/04/18 128/66  05/17/18 140/68  04/29/18 (!) 152/70   Pulse Readings from Last 3 Encounters:  06/04/18 (!) 57  05/17/18 86  04/29/18 63    Renal function: CrCl cannot be calculated (Patient's most recent lab result is older than the maximum 21 days allowed.).  Past Medical History:  Diagnosis Date  . Allergic rhinitis   . Anterior chest wall pain   . Anxiety   . Asthma   . Cough   . [place]  . GERD (gastroesophageal reflux disease)   . History of diverticulitis of colon   . HTN (hypertension)   . Hyperlipidemia   . IBS (irritable bowel syndrome)   . Iron deficiency anemia   . Osteoporosis, unspecified   . Renal insufficiency 07/31/2017  . UTI (urinary tract infection)     Current Outpatient Medications on File Prior to Visit  Medication Sig Dispense Refill  . acetaminophen (TYLENOL) 325 MG tablet Take 650 mg by mouth every 6 (six) hours as needed for pain.    . Marland Kitchenlbuterol (PROAIR HFA) 108 (90 Base) MCG/ACT inhaler Inhale 2 puffs into the lungs every 4 (four) hours as needed for wheezing. 1 Inhaler 1  . albuterol (  PROAIR HFA) 108 (90 Base) MCG/ACT inhaler Inhale 2 puffs into the lungs every 4 (four) hours as needed for wheezing or shortness of breath.    Marland Kitchen amLODipine (NORVASC) 5 MG tablet Take 1 tablet (5 mg total) by mouth 2 (two) times daily. 180 tablet 3  . Blood Glucose Monitoring Suppl (ONE TOUCH ULTRA 2) W/DEVICE KIT Check blood sugar 3 times per day. 1 each 0  . ELIQUIS 5 MG TABS tablet TAKE 1 TABLET (5 MG TOTAL) BY MOUTH 2 (TWO) TIMES DAILY. 60 tablet 5  . insulin lispro (HUMALOG) 100 UNIT/ML injection 5 units with breakfast, and 25 units with supper 20 mL 6  . Insulin Syringe-Needle U-100 (BD INSULIN SYRINGE ULTRAFINE) 31G X 5/16" 0.3 ML MISC Use as directed     . losartan-hydrochlorothiazide (HYZAAR) 100-25 MG tablet Take 1 tablet by mouth daily. 90 tablet 3  . montelukast (SINGULAIR) 10 MG tablet TAKE 1  TABLET BY MOUTH AT BEDTIME 90 tablet 0  . ONE TOUCH LANCETS MISC Use as directed three times daily     . potassium chloride (K-DUR) 10 MEQ tablet Take 1 tablet (10 mEq total) by mouth daily. (Patient taking differently: Take 10 mEq by mouth daily. Take twice weekly usually) 30 tablet 11  . pravastatin (PRAVACHOL) 40 MG tablet TAKE 1 TABLET BY MOUTH DAILY 90 tablet 3  . promethazine-dextromethorphan (PROMETHAZINE-DM) 6.25-15 MG/5ML syrup Take 5 mLs by mouth 4 (four) times daily as needed for cough. 240 mL 0  . SYMBICORT 160-4.5 MCG/ACT inhaler INHALE 2 PUFFS INTO THE LUNGS TWO TIMES DAILY 10.2 Inhaler 11   No current facility-administered medications on file prior to visit.     Allergies  Allergen Reactions  . Aspirin   . Ramipril     REACTION: Cough    Blood pressure 128/66, pulse (!) 57.   Assessment/Plan: Hypertension: BMET today. Blood pressure today is at goal. HR on low side, but she believes this is typical for her. Will continue medications as prescribed and be sure that she takes her medications at the same time each day. Advised her BID medications be taken about 12 hours apart for optimal control/effect. Follow up with Dr. Harrington Challenger as scheduled and HTN clinic as needed.    Thank you, Lelan Pons. Patterson Hammersmith, [facility]  06/04/2018 2:54 PM

## 2018-06-04 NOTE — Patient Instructions (Addendum)
Start taking your twice daily medications about 12 hours apart.   Continue medications as prescribed and follow up as scheduled with Dr. Harrington Challenger.

## 2018-06-06 ENCOUNTER — Telehealth: Payer: Self-pay | Admitting: Pharmacist

## 2018-06-06 MED ORDER — APIXABAN 2.5 MG PO TABS: 2.5000 mg | ORAL_TABLET | Freq: Two times a day (BID) | ORAL | 1 refills | Status: DC

## 2018-06-06 NOTE — Telephone Encounter (Signed)
-----   Message from Liliane Shi, [place] sent at 06/06/2018  9:04 AM EDT ----- The following labs are stable without significant clinical change:  Creatinine. Glucose is elevated.  CO2 is low, which may be related to diabetes.   All other results are normal or within acceptable limits. Medication changes / Follow up labs / Other changes or recommendations:    - Because of renal function, her Eliquis needs to be changed to 2.5 mg Twice daily - I have spoken with Eino Farber, PharmD who will contact the patient to arrange changing her dose.  - Continue follow up with primary care for diabetes.  Fax labs to PCP.  Richardson Dopp, PA-C 06/06/2018 9:02 AM

## 2018-06-06 NOTE — Telephone Encounter (Signed)
Spoke with patient and advised of results. Discussed need for dose change on Eliquis. She will cut her remaining tablets in half and finish her current supply with 1/2 tablet (2.5) BID. New RX has been sent for lower strength. She has an appt with PCP tomorrow and will discuss blood sugars/diabetes with them then.

## 2018-06-07 ENCOUNTER — Encounter: Payer: Self-pay | Admitting: Endocrinology

## 2018-06-07 ENCOUNTER — Ambulatory Visit: Payer: Medicare Other | Admitting: Endocrinology

## 2018-06-07 ENCOUNTER — Other Ambulatory Visit: Payer: Self-pay | Admitting: Internal Medicine

## 2018-06-07 VITALS — BP 138/82 | HR 59 | Ht 67.0 in | Wt 195.0 lb

## 2018-06-07 DIAGNOSIS — J45991 Cough variant asthma: Secondary | ICD-10-CM

## 2018-06-07 MED ORDER — OMEPRAZOLE 40 MG PO CPDR: 40.0000 mg | DELAYED_RELEASE_CAPSULE | Freq: Every day | ORAL | 3 refills | Status: DC

## 2018-06-07 NOTE — Patient Instructions (Addendum)
I have sent a prescription to your pharmacy, to see if this helps the cough. Best wishes with your new primary care provider.   You should continue to monitor the weight, but i'm glad it is better.

## 2018-06-07 NOTE — Progress Notes (Signed)
Subjective:    Patient ID: [name], female    DOB: 11-16-1935, 82 y.o.   MRN: [id number]  HPI Pt has regained a few lbs. Pt states 1 month of dry-quality cough in the chest, and assoc wheezing.   Past Medical History:  Diagnosis Date  . Allergic rhinitis   . Anterior chest wall pain   . Anxiety   . Asthma   . Cough   . DM type 2 (diabetes mellitus, type 2) ([place])   . GERD (gastroesophageal reflux disease)   . History of diverticulitis of colon   . HTN (hypertension)   . Hyperlipidemia   . IBS (irritable bowel syndrome)   . Iron deficiency anemia   . Osteoporosis, unspecified   . Renal insufficiency 07/31/2017  . UTI (urinary tract infection)     Past Surgical History:  Procedure Laterality Date  . CARDIOVASCULAR STRESS TEST  02/25/04  . ESOPHAGOGASTRODUODENOSCOPY  12/26/01  . RIGHT OOPHORECTOMY  jan 2010    Social History   Socioeconomic History  . Marital status: Widowed    Spouse name: Not on file  . Number of children: Not on file  . Years of education: Not on file  . Highest education level: Not on file  Occupational History  . Occupation: retired  Scientific laboratory technician  . Financial resource strain: Not on file  . Food insecurity:    Worry: Not on file    Inability: Not on file  . Transportation needs:    Medical: Not on file    Non-medical: Not on file  Tobacco Use  . Smoking status: Former Smoker    Packs/day: 0.30    Years: 5.00    Pack years: 1.50    Types: Cigarettes    Last attempt to quit: 10/02/1986    Years since quitting: 31.7  . Smokeless tobacco: Never Used  Substance and Sexual Activity  . Alcohol use: No  . Drug use: No  . Sexual activity: Not on file  Lifestyle  . Physical activity:    Days per week: Not on file    Minutes per session: Not on file  . Stress: Not on file  Relationships  . Social connections:    Talks on phone: Not on file    Gets together: Not on file    Attends religious service: Not on file    Active member of club  or organization: Not on file    Attends meetings of clubs or organizations: Not on file    Relationship status: Not on file  . Intimate partner violence:    Fear of current or ex partner: Not on file    Emotionally abused: Not on file    Physically abused: Not on file    Forced sexual activity: Not on file  Other Topics Concern  . Not on file  Social History Narrative  . Not on file    Current Outpatient Medications on File Prior to Visit  Medication Sig Dispense Refill  . acetaminophen (TYLENOL) 325 MG tablet Take 650 mg by mouth every 6 (six) hours as needed for pain.    Marland Kitchen albuterol (PROAIR HFA) 108 (90 Base) MCG/ACT inhaler Inhale 2 puffs into the lungs every 4 (four) hours as needed for wheezing or shortness of breath.    Marland Kitchen amLODipine (NORVASC) 5 MG tablet Take 1 tablet (5 mg total) by mouth 2 (two) times daily. 180 tablet 3  . apixaban (ELIQUIS) 2.5 MG TABS tablet Take 1 tablet (2.5 mg  total) by mouth 2 (two) times daily. 180 tablet 1  . Blood Glucose Monitoring Suppl (ONE TOUCH ULTRA 2) W/DEVICE KIT Check blood sugar 3 times per day. 1 each 0  . insulin lispro (HUMALOG) 100 UNIT/ML injection 5 units with breakfast, and 25 units with supper 20 mL 6  . Insulin Syringe-Needle U-100 (BD INSULIN SYRINGE ULTRAFINE) 31G X 5/16" 0.3 ML MISC Use as directed     . losartan-hydrochlorothiazide (HYZAAR) 100-25 MG tablet Take 1 tablet by mouth daily. 90 tablet 3  . montelukast (SINGULAIR) 10 MG tablet TAKE 1 TABLET BY MOUTH AT BEDTIME 90 tablet 0  . ONE TOUCH LANCETS MISC Use as directed three times daily     . potassium chloride (K-DUR) 10 MEQ tablet Take 1 tablet (10 mEq total) by mouth daily. (Patient taking differently: Take 10 mEq by mouth daily. Take twice weekly usually) 30 tablet 11  . pravastatin (PRAVACHOL) 40 MG tablet TAKE 1 TABLET BY MOUTH DAILY 90 tablet 3  . SYMBICORT 160-4.5 MCG/ACT inhaler INHALE 2 PUFFS INTO THE LUNGS TWO TIMES DAILY 10.2 Inhaler 11  . albuterol (PROAIR HFA)  108 (90 Base) MCG/ACT inhaler Inhale 2 puffs into the lungs every 4 (four) hours as needed for wheezing. 1 Inhaler 1   No current facility-administered medications on file prior to visit.     Allergies  Allergen Reactions  . Aspirin   . Ramipril     REACTION: Cough    Family History  Problem Relation Age of Onset  . Lung cancer Brother   . Melanoma Brother   . Hypertension Mother   . Stroke Mother   . Other Father        poor circulation  . Atopy Neg Hx   . Asthma Neg Hx   . Breast cancer Neg Hx     BP 138/82 (BP Location: Left Arm, Patient Position: Sitting, Cuff Size: Large)   Pulse (!) 59   Ht '5\' 7"'  (1.702 m)   Wt 195 lb (88.5 kg)   SpO2 96%   BMI 30.54 kg/m    Review of Systems She has doe, but no fever.      Objective:   Physical Exam VITAL SIGNS:  See vs page GENERAL: no distress LUNGS:  Clear to auscultation      Assessment & Plan:  Cough, persistent.  r/o GERD as a cause Weight loss, improved, so we'll defer w/u for now.  HTN: well-controlled.  Please continue the same medication  Patient Instructions  I have sent a prescription to your pharmacy, to see if this helps the cough. Best wishes with your new primary care provider.   You should continue to monitor the weight, but i'm glad it is better.

## 2018-06-25 ENCOUNTER — Other Ambulatory Visit: Payer: Self-pay | Admitting: Endocrinology

## 2018-07-03 ENCOUNTER — Other Ambulatory Visit: Payer: Self-pay | Admitting: Endocrinology

## 2018-07-03 DIAGNOSIS — Z1231 Encounter for screening mammogram for malignant neoplasm of breast: Secondary | ICD-10-CM

## 2018-07-05 ENCOUNTER — Encounter: Payer: Medicare Other | Admitting: Family

## 2018-07-08 ENCOUNTER — Ambulatory Visit (INDEPENDENT_AMBULATORY_CARE_PROVIDER_SITE_OTHER): Payer: Medicare Other | Admitting: Family

## 2018-07-08 ENCOUNTER — Other Ambulatory Visit: Payer: Self-pay | Admitting: Family

## 2018-07-08 ENCOUNTER — Other Ambulatory Visit (INDEPENDENT_AMBULATORY_CARE_PROVIDER_SITE_OTHER): Payer: Medicare Other

## 2018-07-08 ENCOUNTER — Encounter: Payer: Self-pay | Admitting: Family

## 2018-07-08 VITALS — BP 148/78 | HR 57 | Temp 98.2°F | Ht 67.0 in | Wt 193.1 lb

## 2018-07-08 DIAGNOSIS — N183 Chronic kidney disease, stage 3 unspecified: Secondary | ICD-10-CM

## 2018-07-08 DIAGNOSIS — E785 Hyperlipidemia, unspecified: Secondary | ICD-10-CM

## 2018-07-08 DIAGNOSIS — Z23 Encounter for immunization: Secondary | ICD-10-CM

## 2018-07-08 DIAGNOSIS — I10 Essential (primary) hypertension: Secondary | ICD-10-CM

## 2018-07-08 DIAGNOSIS — E559 Vitamin D deficiency, unspecified: Secondary | ICD-10-CM

## 2018-07-08 DIAGNOSIS — E1122 Type 2 diabetes mellitus with diabetic chronic kidney disease: Secondary | ICD-10-CM | POA: Diagnosis not present

## 2018-07-08 DIAGNOSIS — Z794 Long term (current) use of insulin: Secondary | ICD-10-CM

## 2018-07-08 DIAGNOSIS — I4821 Permanent atrial fibrillation: Secondary | ICD-10-CM

## 2018-07-08 DIAGNOSIS — K219 Gastro-esophageal reflux disease without esophagitis: Secondary | ICD-10-CM

## 2018-07-08 DIAGNOSIS — J45991 Cough variant asthma: Secondary | ICD-10-CM

## 2018-07-08 LAB — COMPREHENSIVE METABOLIC PANEL
ALT: 13 U/L (ref 0–35)
AST: 16 U/L (ref 0–37)
Albumin: 4.1 g/dL (ref 3.5–5.2)
Alkaline Phosphatase: 81 U/L (ref 39–117)
BUN: 24 mg/dL — ABNORMAL HIGH (ref 6–23)
CALCIUM: 9.7 mg/dL (ref 8.4–10.5)
CHLORIDE: 107 meq/L (ref 96–112)
CO2: 24 meq/L (ref 19–32)
Creatinine, Ser: 1.59 mg/dL — ABNORMAL HIGH (ref 0.40–1.20)
GFR: 40.16 mL/min — AB (ref >60.00)
Glucose, Bld: 175 mg/dL — ABNORMAL HIGH (ref 70–99)
Potassium: 3.9 mEq/L (ref 3.5–5.1)
Sodium: 142 mEq/L (ref 135–145)
Total Bilirubin: 0.8 mg/dL (ref 0.2–1.2)
Total Protein: 7.8 g/dL (ref 6.0–8.3)

## 2018-07-08 LAB — LIPID PANEL
CHOL/HDL RATIO: 2
Cholesterol: 114 mg/dL (ref 0–200)
HDL: 51.1 mg/dL (ref >39.00)
LDL CALC: 50 mg/dL (ref 0–99)
NonHDL: 63.06
TRIGLYCERIDES: 65 mg/dL (ref 0.0–149.0)
VLDL: 13 mg/dL (ref 0.0–40.0)

## 2018-07-08 LAB — VITAMIN D 25 HYDROXY (VIT D DEFICIENCY, FRACTURES): VITD: 24 ng/mL — AB (ref 30.00–100.00)

## 2018-07-08 MED ORDER — VITAMIN D (ERGOCALCIFEROL) 1.25 MG (50000 UNIT) PO CAPS: 50000.0000 [IU] | ORAL_CAPSULE | ORAL | 0 refills | Status: AC

## 2018-07-08 MED ORDER — PRAVASTATIN SODIUM 40 MG PO TABS: 40.0000 mg | ORAL_TABLET | Freq: Every day | ORAL | 3 refills | Status: DC

## 2018-07-08 NOTE — Progress Notes (Signed)
[name] is a 82 y.o. female with the following history as recorded in [employer]:  Patient Active Problem List   Diagnosis Date Noted  . DOE (dyspnea on exertion) 01/30/2018  . Acute bronchiolitis 12/17/2017  . Wheezing 10/29/2017  . Vitamin D deficiency 07/31/2017  . Renal insufficiency 07/31/2017  . Knee pain 06/01/2017  . Morbid obesity due to excess calories ([place]) 01/25/2017  . Diabetes ([place]) 04/12/2016  . Cough 01/20/2015  . Atrial fibrillation ([place]) 05/28/2014  . Personal history of colonic polyps 05/28/2014  . Vomiting 01/29/2014  . CAD (coronary artery disease) 09/01/2013  . Routine general medical examination at a health care facility 07/17/2011  . Encounter for long-term (current) use of other medications 07/06/2011  . Nonspecific (abnormal) findings on radiological and other examination of body structure 11/09/2009  . IRRITABLE BOWEL SYNDROME 05/07/2009  . CHEST PAIN 05/07/2009  . ADNEXAL MASS, RIGHT 07/22/2008  . HEMORRHOIDS, RECURRENT 01/27/2008  . Diverticulitis 01/27/2008  . OTH ABNORMAL FIND RAD EXAMINATION BREAST 01/27/2008  . GERD 01/13/2008  . UTI 09/28/2007  . Dyslipidemia 05/27/2007  . ANEMIA-IRON DEFICIENCY 05/27/2007  . ANXIETY 05/27/2007  . Essential hypertension 05/27/2007  . Seasonal allergic rhinitis 05/27/2007  . Cough variant asthma 05/27/2007  . Osteoporosis 05/27/2007  . DIVERTICULITIS, HX OF 05/27/2007    Current Outpatient Medications  Medication Sig Dispense Refill  . acetaminophen (TYLENOL) 325 MG tablet Take 650 mg by mouth every 6 (six) hours as needed for pain.    Marland Kitchen amLODipine (NORVASC) 5 MG tablet Take 1 tablet (5 mg total) by mouth 2 (two) times daily. 180 tablet 3  . apixaban (ELIQUIS) 2.5 MG TABS tablet Take 1 tablet (2.5 mg total) by mouth 2 (two) times daily. 180 tablet 1  . Blood Glucose Monitoring Suppl (ONE TOUCH ULTRA 2) W/DEVICE KIT Check blood sugar 3 times per day. 1 each 0  . clotrimazole (MYCELEX) 10 MG troche TAKE 1  TABLET (10 MG TOTAL) BY MOUTH 5 (FIVE) TIMES DAILY.  1  . insulin lispro (HUMALOG) 100 UNIT/ML injection 5 units with breakfast, and 25 units with supper 20 mL 6  . Insulin Syringe-Needle U-100 (BD INSULIN SYRINGE ULTRAFINE) 31G X 5/16" 0.3 ML MISC Use as directed     . losartan-hydrochlorothiazide (HYZAAR) 100-25 MG tablet Take 1 tablet by mouth daily. 90 tablet 3  . montelukast (SINGULAIR) 10 MG tablet TAKE 1 TABLET BY MOUTH AT BEDTIME 90 tablet 0  . omeprazole (PRILOSEC) 40 MG capsule Take 1 capsule (40 mg total) by mouth daily. 30 capsule 3  . ONE TOUCH LANCETS MISC Use as directed three times daily     . potassium chloride (K-DUR) 10 MEQ tablet Take 1 tablet (10 mEq total) by mouth daily. (Patient taking differently: Take 10 mEq by mouth daily. Take twice weekly usually) 30 tablet 11  . pravastatin (PRAVACHOL) 40 MG tablet TAKE 1 TABLET BY MOUTH DAILY 90 tablet 3  . SYMBICORT 160-4.5 MCG/ACT inhaler INHALE 2 PUFFS INTO THE LUNGS TWO TIMES DAILY 10.2 Inhaler 11  . albuterol (PROAIR HFA) 108 (90 Base) MCG/ACT inhaler Inhale 2 puffs into the lungs every 4 (four) hours as needed for wheezing. 1 Inhaler 1   No current facility-administered medications for this visit.     Allergies: Aspirin and Ramipril  Past Medical History:  Diagnosis Date  . Allergic rhinitis   . Anterior chest wall pain   . Anxiety   . Asthma   . Cough   . DM type 2 (diabetes  mellitus, type 2) ([place])   . GERD (gastroesophageal reflux disease)   . History of diverticulitis of colon   . HTN (hypertension)   . Hyperlipidemia   . IBS (irritable bowel syndrome)   . Iron deficiency anemia   . Osteoporosis, unspecified   . Renal insufficiency 07/31/2017  . UTI (urinary tract infection)     Past Surgical History:  Procedure Laterality Date  . CARDIOVASCULAR STRESS TEST  02/25/04  . ESOPHAGOGASTRODUODENOSCOPY  12/26/01  . RIGHT OOPHORECTOMY  jan 2010    Family History  Problem Relation Age of Onset  . Lung cancer  Brother   . Melanoma Brother   . Hypertension Mother   . Stroke Mother   . Other Father        poor circulation  . Atopy Neg Hx   . Asthma Neg Hx   . Breast cancer Neg Hx     Social History   Tobacco Use  . Smoking status: Former Smoker    Packs/day: 0.30    Years: 5.00    Pack years: 1.50    Types: Cigarettes    Last attempt to quit: 10/02/1986    Years since quitting: 31.7  . Smokeless tobacco: Never Used  Substance Use Topics  . Alcohol use: No    Subjective:  Patient presents to transfer care from Dr. Loanne Drilling;  1) Type 2 Diabetes- will continue with Dr. Loanne Drilling; last Hgba1c was at 6.1; 2) Asthma- under care of Dr. Melvyn Novas- due to see him in January 2019;  3) A. Fib- on Eliquis- Dr. Harrington Challenger; due to see her again in December 2019 4) GERD- using Omeprazole as needed; 5) Has seen hypertension clinic in 06/2018; was told that current regimen stable;  6) Worried about thickened, painful toenails; would like to go back to podiatry  Has mammogram scheduled for November; eye doctor scheduled for November;    Objective:  Vitals:   07/08/18 0830  BP: (!) 148/78  Pulse: (!) 57  Temp: 98.2 F (36.8 C)  TempSrc: Oral  SpO2: 98%  Weight: 193 lb 1.9 oz (87.6 kg)  Height: '5\' 7"'  (1.702 m)    General: Well developed, well nourished, in no acute distress  Skin : Warm and dry.  Head: Normocephalic and atraumatic  Lungs: Respirations unlabored; clear to auscultation bilaterally without wheeze, rales, rhonchi  CVS exam: normal rate and regular rhythm.  Neurologic: Alert and oriented; speech intact; face symmetrical; moves all extremities well; CNII-XII intact without focal deficit   Assessment:  1. Type 2 diabetes mellitus with stage 3 chronic kidney disease, with long-term current use of insulin ([place])   2. Dyslipidemia   3. Vitamin D deficiency   4. Essential hypertension   5. Permanent atrial fibrillation   6. Cough variant asthma   7. Gastroesophageal reflux disease, esophagitis  presence not specified     Plan:  Patient will continue with her specialists as scheduled; Update labs today for cholesterol and Vitamin D; Flu shot given; Referral to podiatry; encouraged to see dentist;  No follow-ups on file.  Orders Placed This Encounter  Procedures  . Lipid panel    Standing Status:   Future    Standing Expiration Date:   07/09/2019  . Comp Met (CMET)    Standing Status:   Future    Standing Expiration Date:   07/08/2019  . Vitamin D (25 hydroxy)    Standing Status:   Future    Standing Expiration Date:   07/08/2019  . Ambulatory referral  to Podiatry    Referral Priority:   Routine    Referral Type:   Consultation    Referral Reason:   Specialty Services Required    Requested Specialty:   Podiatry    Number of Visits Requested:   1    Requested Prescriptions    No prescriptions requested or ordered in this encounter

## 2018-07-08 NOTE — Addendum Note (Signed)
Addended by: Marcina Millard on: 07/08/2018 09:03 AM   Modules accepted: Orders

## 2018-07-14 ENCOUNTER — Other Ambulatory Visit: Payer: Self-pay | Admitting: Endocrinology

## 2018-07-24 ENCOUNTER — Other Ambulatory Visit: Payer: Self-pay | Admitting: Internal Medicine

## 2018-08-05 ENCOUNTER — Ambulatory Visit: Payer: Self-pay | Admitting: Podiatry

## 2018-08-07 ENCOUNTER — Ambulatory Visit
Admission: RE | Admit: 2018-08-07 | Discharge: 2018-08-07 | Disposition: A | Discharge status: Home / Self Care | Payer: Medicare Other | Source: Ambulatory Visit | Attending: Endocrinology | Admitting: Endocrinology

## 2018-08-07 DIAGNOSIS — Z1231 Encounter for screening mammogram for malignant neoplasm of breast: Secondary | ICD-10-CM | POA: Diagnosis not present

## 2018-08-19 ENCOUNTER — Ambulatory Visit: Payer: Self-pay | Admitting: Podiatry

## 2018-08-19 DIAGNOSIS — Z794 Long term (current) use of insulin: Secondary | ICD-10-CM | POA: Diagnosis not present

## 2018-08-19 DIAGNOSIS — Z961 Presence of intraocular lens: Secondary | ICD-10-CM | POA: Diagnosis not present

## 2018-08-19 DIAGNOSIS — E119 Type 2 diabetes mellitus without complications: Secondary | ICD-10-CM | POA: Diagnosis not present

## 2018-08-28 ENCOUNTER — Ambulatory Visit: Payer: Self-pay | Admitting: Podiatry

## 2018-09-02 ENCOUNTER — Other Ambulatory Visit: Payer: Self-pay | Admitting: Endocrinology

## 2018-09-10 ENCOUNTER — Ambulatory Visit: Payer: Medicare Other | Admitting: Podiatry

## 2018-09-10 ENCOUNTER — Encounter: Payer: Self-pay | Admitting: Podiatry

## 2018-09-10 VITALS — BP 177/74

## 2018-09-10 DIAGNOSIS — B351 Tinea unguium: Secondary | ICD-10-CM | POA: Diagnosis not present

## 2018-09-10 DIAGNOSIS — M79674 Pain in right toe(s): Secondary | ICD-10-CM

## 2018-09-10 DIAGNOSIS — M79675 Pain in left toe(s): Secondary | ICD-10-CM | POA: Diagnosis not present

## 2018-09-10 DIAGNOSIS — E119 Type 2 diabetes mellitus without complications: Secondary | ICD-10-CM | POA: Diagnosis not present

## 2018-09-10 DIAGNOSIS — L84 Corns and callosities: Secondary | ICD-10-CM | POA: Diagnosis not present

## 2018-09-10 NOTE — Patient Instructions (Addendum)
Corns and Calluses Corns are small areas of thickened skin that occur on the top, sides, or tip of a toe. They contain a cone-shaped core with a point that can press on a nerve below. This causes pain. Calluses are areas of thickened skin that can occur anywhere on the body including hands, fingers, palms, soles of the feet, and heels.Calluses are usually larger than corns. What are the causes? Corns and calluses are caused by rubbing (friction) or pressure, such as from shoes that are too tight or do not fit properly. What increases the risk? Corns are more likely to develop in people who have toe deformities, such as hammer toes. Since calluses can occur with friction to any area of the skin, calluses are more likely to develop in people who:  Work with their hands.  Wear shoes that fit poorly, shoes that are too tight, or shoes that are high-heeled.  Have toes deformities.  What are the signs or symptoms? Symptoms of a corn or callus include:  A hard growth on the skin.  Pain or tenderness under the skin.  Redness and swelling.  Increased discomfort while wearing tight-fitting shoes.  How is this diagnosed? Corns and calluses may be diagnosed with a medical history and physical exam. How is this treated? Corns and calluses may be treated with:  Removing the cause of the friction or pressure. This may include: ? Changing your shoes. ? Wearing shoe inserts (orthotics) or other protective layers in your shoes, such as a corn pad. ? Wearing gloves.  Medicines to help soften skin in the hardened, thickened areas.  Reducing the size of the corn or callus by removing the dead layers of skin.  Antibiotic medicines to treat infection.  Surgery, if a toe deformity is the cause.  Follow these instructions at home:  Take medicines only as directed by your health care provider.  If you were prescribed an antibiotic, finish all of it even if you start to feel better.  Wear  shoes that fit well. Avoid wearing high-heeled shoes and shoes that are too tight or too loose.  Wear any padding, protective layers, gloves, or orthotics as directed by your health care provider.  Soak your hands or feet and then use a file or pumice stone to soften your corn or callus. Do this as directed by your health care provider.  Check your corn or callus every day for signs of infection. Watch for: ? Redness, swelling, or pain. ? Fluid, blood, or pus. Contact a health care provider if:  Your symptoms do not improve with treatment.  You have increased redness, swelling, or pain at the site of your corn or callus.  You have fluid, blood, or pus coming from your corn or callus.  You have new symptoms. This information is not intended to replace advice given to you by your health care provider. Make sure you discuss any questions you have with your health care provider. Document Released: 06/24/2004 Document Revised: 04/07/2016 Document Reviewed: 09/14/2014 Elsevier Interactive Patient Education  2018 [employer]  Hammer Toe Hammer toe is a change in the shape (a deformity) of your second, third, or fourth toe. The deformity causes the middle joint of your toe to stay bent. This causes pain, especially when you are wearing shoes. Hammer toe starts gradually. At first, the toe can be straightened. Gradually over time, the deformity becomes stiff and permanent. Early treatments to keep the toe straight may relieve pain. As the deformity becomes stiff   and permanent, surgery may be needed to straighten the toe. What are the causes? Hammer toe is caused by abnormal bending of the toe joint that is closest to your foot. It happens gradually over time. This pulls on the muscles and connections (tendons) of the toe joint, making them weak and stiff. It is often related to wearing shoes that are too short or narrow and do not let your toes straighten. What increases the risk? You may be at  greater risk for hammer toe if you:  Are female.  Are older.  Wear shoes that are too small.  Wear high-heeled shoes that pinch your toes.  Are a ballet dancer.  Have a second toe that is longer than your big toe (first toe).  Injure your foot or toe.  Have arthritis.  Have a family history of hammer toe.  Have a nerve or muscle disorder.  What are the signs or symptoms? The main symptoms of this condition are pain and deformity of the toe. The pain is worse when wearing shoes, walking, or running. Other symptoms may include:  Corns or calluses over the bent part of the toe or between the toes.  Redness and a burning feeling on the toe.  An open sore that forms on the top of the toe.  Not being able to straighten the toe.  How is this diagnosed? This condition is diagnosed based on your symptoms and a physical exam. During the exam, your health care provider will try to straighten your toe to see how stiff the deformity is. You may also have tests, such as:  A blood test to check for rheumatoid arthritis.  An X-ray to show how severe the deformity is.  How is this treated? Treatment for this condition will depend on how stiff the deformity is. Surgery is often needed. However, sometimes a hammer toe can be straightened without surgery. Treatments that do not involve surgery include:  Taping the toe into a straightened position.  Using pads and cushions to protect the toe (orthotics).  Wearing shoes that provide enough room for the toes.  Doing toe-stretching exercises at home.  Taking an NSAID to reduce pain and swelling.  If these treatments do not help or the toe cannot be straightened, surgery is the next option. The most common surgeries used to straighten a hammer toe include:  Arthroplasty. In this procedure, part of the joint is removed, and that allows the toe to straighten.  Fusion. In this procedure, cartilage between the two bones of the joint is  taken out and the bones are fused together into one longer bone.  Implantation. In this procedure, part of the bone is removed and replaced with an implant to let the toe move again.  Flexor tendon transfer. In this procedure, the tendons that curl the toes down (flexor tendons) are repositioned.  Follow these instructions at home:  Take over-the-counter and prescription medicines only as told by your health care provider.  Do toe straightening and stretching exercises as told by your health care provider.  Keep all follow-up visits as told by your health care provider. This is important. How is this prevented?  Wear shoes that give your toes enough room and do not cause pain.  Do not wear high-heeled shoes. Contact a health care provider if:  Your pain gets worse.  Your toe becomes red or swollen.  You develop an open sore on your toe. This information is not intended to replace advice given to you   by your health care provider. Make sure you discuss any questions you have with your health care provider. Document Released: 09/15/2000 Document Revised: 04/07/2016 Document Reviewed: 01/12/2016 Elsevier Interactive Patient Education  2018 [employer].   Diabetes and Foot Care Diabetes may cause you to have problems because of poor blood supply (circulation) to your feet and legs. This may cause the skin on your feet to become thinner, break easier, and heal more slowly. Your skin may become dry, and the skin may peel and crack. You may also have nerve damage in your legs and feet causing decreased feeling in them. You may not notice minor injuries to your feet that could lead to infections or more serious problems. Taking care of your feet is one of the most important things you can do for yourself. Follow these instructions at home:  Wear shoes at all times, even in the house. Do not go barefoot. Bare feet are easily injured.  Check your feet daily for blisters, cuts, and redness.  If you cannot see the bottom of your feet, use a mirror or ask someone for help.  Wash your feet with warm water (do not use hot water) and mild soap. Then pat your feet and the areas between your toes until they are completely dry. Do not soak your feet as this can dry your skin.  Apply a moisturizing lotion or petroleum jelly (that does not contain alcohol and is unscented) to the skin on your feet and to dry, brittle toenails. Do not apply lotion between your toes.  Trim your toenails straight across. Do not dig under them or around the cuticle. File the edges of your nails with an emery board or nail file.  Do not cut corns or calluses or try to remove them with medicine.  Wear clean socks or stockings every day. Make sure they are not too tight. Do not wear knee-high stockings since they may decrease blood flow to your legs.  Wear shoes that fit properly and have enough cushioning. To break in new shoes, wear them for just a few hours a day. This prevents you from injuring your feet. Always look in your shoes before you put them on to be sure there are no objects inside.  Do not cross your legs. This may decrease the blood flow to your feet.  If you find a minor scrape, cut, or break in the skin on your feet, keep it and the skin around it clean and dry. These areas may be cleansed with mild soap and water. Do not cleanse the area with peroxide, alcohol, or iodine.  When you remove an adhesive bandage, be sure not to damage the skin around it.  If you have a wound, look at it several times a day to make sure it is healing.  Do not use heating pads or hot water bottles. They may burn your skin. If you have lost feeling in your feet or legs, you may not know it is happening until it is too late.  Make sure your health care provider performs a complete foot exam at least annually or more often if you have foot problems. Report any cuts, sores, or bruises to your health care provider  immediately. Contact a health care provider if:  You have an injury that is not healing.  You have cuts or breaks in the skin.  You have an ingrown nail.  You notice redness on your legs or feet.  You feel burning or tingling  in your legs or feet.  You have pain or cramps in your legs and feet.  Your legs or feet are numb.  Your feet always feel cold. Get help right away if:  There is increasing redness, swelling, or pain in or around a wound.  There is a red line that goes up your leg.  Pus is coming from a wound.  You develop a fever or as directed by your health care provider.  You notice a bad smell coming from an ulcer or wound. This information is not intended to replace advice given to you by your health care provider. Make sure you discuss any questions you have with your health care provider. Document Released: 09/15/2000 Document Revised: 02/24/2016 Document Reviewed: 02/25/2013 Elsevier Interactive Patient Education  2017 [employer].

## 2018-09-10 NOTE — Progress Notes (Signed)
Subjective: [name] presents today referred by Sheryl Salvage, FNP with diabetes and cc of painful, discolored, thick toenails which interfere with daily activities.  Most symptomatic is her right great toe which hurts when she wears shoes.  Duration is about 1 month.  She relates that she has had her daughter trim her toenails in the past.  [name] is also on the blood thinner, Eliquis.  Past Medical History:  Diagnosis Date  . Allergic rhinitis   . Anterior chest wall pain   . Anxiety   . Asthma   . Cough   . DM type 2 (diabetes mellitus, type 2) ([place])   . GERD (gastroesophageal reflux disease)   . History of diverticulitis of colon   . HTN (hypertension)   . Hyperlipidemia   . IBS (irritable bowel syndrome)   . Iron deficiency anemia   . Osteoporosis, unspecified   . Renal insufficiency 07/31/2017  . UTI (urinary tract infection)     Patient Active Problem List   Diagnosis Date Noted  . DOE (dyspnea on exertion) 01/30/2018  . Acute bronchiolitis 12/17/2017  . Wheezing 10/29/2017  . Vitamin D deficiency 07/31/2017  . Renal insufficiency 07/31/2017  . Knee pain 06/01/2017  . Morbid obesity due to excess calories ([place]) 01/25/2017  . Diabetes ([place]) 04/12/2016  . Cough 01/20/2015  . Atrial fibrillation ([place]) 05/28/2014  . Personal history of colonic polyps 05/28/2014  . Vomiting 01/29/2014  . CAD (coronary artery disease) 09/01/2013  . Routine general medical examination at a health care facility 07/17/2011  . Encounter for long-term (current) use of other medications 07/06/2011  . Nonspecific (abnormal) findings on radiological and other examination of body structure 11/09/2009  . IRRITABLE BOWEL SYNDROME 05/07/2009  . CHEST PAIN 05/07/2009  . ADNEXAL MASS, RIGHT 07/22/2008  . HEMORRHOIDS, RECURRENT 01/27/2008  . Diverticulitis 01/27/2008  . OTH ABNORMAL FIND RAD EXAMINATION BREAST 01/27/2008  . GERD 01/13/2008  . UTI 09/28/2007  . Dyslipidemia 05/27/2007   . ANEMIA-IRON DEFICIENCY 05/27/2007  . ANXIETY 05/27/2007  . Essential hypertension 05/27/2007  . Seasonal allergic rhinitis 05/27/2007  . Cough variant asthma 05/27/2007  . Osteoporosis 05/27/2007  . DIVERTICULITIS, HX OF 05/27/2007    Past Surgical History:  Procedure Laterality Date  . CARDIOVASCULAR STRESS TEST  02/25/04  . ESOPHAGOGASTRODUODENOSCOPY  12/26/01  . RIGHT OOPHORECTOMY  jan 2010     Current Outpatient Medications:  .  acetaminophen (TYLENOL) 325 MG tablet, Take 650 mg by mouth every 6 (six) hours as needed for pain., Disp: , Rfl:  .  amLODipine (NORVASC) 5 MG tablet, TAKE 1 TABLET BY MOUTH TWICE A DAY, Disp: 180 tablet, Rfl: 2 .  apixaban (ELIQUIS) 2.5 MG TABS tablet, Take 1 tablet (2.5 mg total) by mouth 2 (two) times daily., Disp: 180 tablet, Rfl: 1 .  Blood Glucose Monitoring Suppl (ONE TOUCH ULTRA 2) W/DEVICE KIT, Check blood sugar 3 times per day., Disp: 1 each, Rfl: 0 .  clotrimazole (MYCELEX) 10 MG troche, TAKE 1 TABLET (10 MG TOTAL) BY MOUTH 5 (FIVE) TIMES DAILY., Disp: , Rfl: 1 .  insulin lispro (HUMALOG) 100 UNIT/ML injection, 5 units with breakfast, and 25 units with supper, Disp: 20 mL, Rfl: 6 .  Insulin Syringe-Needle U-100 (BD INSULIN SYRINGE ULTRAFINE) 31G X 5/16" 0.3 ML MISC, Use as directed , Disp: , Rfl:  .  losartan-hydrochlorothiazide (HYZAAR) 100-25 MG tablet, Take 1 tablet by mouth daily., Disp: 90 tablet, Rfl: 3 .  montelukast (SINGULAIR) 10 MG tablet, TAKE  1 TABLET BY MOUTH AT BEDTIME, Disp: 90 tablet, Rfl: 0 .  omeprazole (PRILOSEC) 40 MG capsule, Take 1 capsule (40 mg total) by mouth daily., Disp: 30 capsule, Rfl: 3 .  ONE TOUCH LANCETS MISC, Use as directed three times daily , Disp: , Rfl:  .  potassium chloride (K-DUR) 10 MEQ tablet, Take 1 tablet (10 mEq total) by mouth daily. (Patient taking differently: Take 10 mEq by mouth daily. Take twice weekly usually), Disp: 30 tablet, Rfl: 11 .  pravastatin (PRAVACHOL) 40 MG tablet, Take 1 tablet  (40 mg total) by mouth daily., Disp: 90 tablet, Rfl: 3 .  SYMBICORT 160-4.5 MCG/ACT inhaler, INHALE 2 PUFFS INTO THE LUNGS TWO TIMES DAILY, Disp: 10.2 Inhaler, Rfl: 11 .  Vitamin D, Ergocalciferol, (DRISDOL) 50000 units CAPS capsule, Take 1 capsule (50,000 Units total) by mouth every 7 (seven) days for 12 doses., Disp: 12 capsule, Rfl: 0 .  albuterol (PROAIR HFA) 108 (90 Base) MCG/ACT inhaler, Inhale 2 puffs into the lungs every 4 (four) hours as needed for wheezing., Disp: 1 Inhaler, Rfl: 1  Allergies  Allergen Reactions  . Aspirin   . Ramipril     REACTION: Cough    Social History   Occupational History  . Occupation: retired  Tobacco Use  . Smoking status: Former Smoker    Packs/day: 0.30    Years: 5.00    Pack years: 1.50    Types: Cigarettes    Last attempt to quit: 10/02/1986    Years since quitting: 31.9  . Smokeless tobacco: Never Used  Substance and Sexual Activity  . Alcohol use: No  . Drug use: No  . Sexual activity: Not on file    Family History  Problem Relation Age of Onset  . Lung cancer Brother   . Melanoma Brother   . Hypertension Mother   . Stroke Mother   . Other Father        poor circulation  . Atopy Neg Hx   . Asthma Neg Hx   . Breast cancer Neg Hx     Immunization History  Administered Date(s) Administered  . Influenza Split 07/17/2011, 07/05/2012  . Influenza Whole 07/22/2008  . Influenza, High Dose Seasonal PF 07/08/2018  . Influenza,inj,Quad PF,6+ Mos 07/18/2013, 06/16/2014  . Pneumococcal Conjugate-13 04/12/2016  . Pneumococcal Polysaccharide-23 10/02/2006  . Td 08/02/1998     Review of systems: Positive Findings in bold print.  Constitutional:  chills, fatigue, fever, sweats, weight change Communication: Optometrist, sign Ecologist, hand writing, iPad/Android device Eyes: diplopia, glare,  light sensitivity, eyeglasses, blindness Ears nose mouth throat: Hard of hearing, deaf, sign language,  vertigo,   bloody nose,   rhinitis,  cold sores, snoring Cardiovascular: HTN, edema, arrhythmia, pacemaker in place, defibrillator in place,  chest pain/tightness, chronic anticoagulation, blood clot Respiratory:  difficulty breathing, denies congestion, SOB, wheezing, cough Gastrointestinal: abdominal pain, diarrhea, nausea, vomiting,  Genitourinary:  nocturia,  pain on urination,  blood in urine, Foley catheter, urinary urgency Musculoskeletal: Uses mobility aid,  cramping, stiff joints, painful joints,  Skin: +changes in toenails, color change dryness, itchy skin, mole changes, or rash  Neurological: numbness, paresthesias, burning in feet, denies fainting,  seizure, change in speech. denies headaches, memory problems/poor historian, cerebral palsy Endocrine: diabetes, hypothyroidism, hyperthyroidism,  dry mouth, flushing, denies heat intolerance,  cold intolerance,  excessive thirst, denies polyuria,  nocturia Hematological:  easy bleeding,  excessive bleeding, easy bruising, enlarged lymph nodes, on long term blood thinner Allergy/immunological:  hive, frequent infections, multiple  drug allergies, seasonal allergies,  Psychiatric:  anxiety, depression, mood disorder, suicidal ideations, hallucinations   Objective: Vascular Examination: Capillary refill time immediate x 10 digits Dorsalis pedis and posterior tibial pulses present 1/4 b/l No digital hair x 10 digits Skin temperature gradient WNL b/l  Dermatological Examination: Skin with normal turgor, texture and tone b/l  Toenails 1-5 b/l discolored, thick, dystrophic with subungual debris and pain with palpation to nailbeds due to thickness of nails.  Hyperkeratotic lesions noted submetatarsal head 5 bilaterally.  There is no erythema, no edema, no drainage, no flocculence noted with these lesions.  Musculoskeletal: Muscle strength 5/5 to all LE muscle groups  Hallux abductovalgus with bunion deformity noted bilaterally  Hammertoe deformity noted,  semirigid in nature, digits 2 through 5 bilaterally  Neurological: Sensation intact with 10 gram monofilament Vibratory sensation intact.  Assessment: 1. Painful onychomycosis toenails 1-5 b/l  2. Calluses submetatarsal head 5 bilaterally 3. NIDDM with PAD  Plan: 1. Discussed diabetic foot care principles. Literature dispensed on today. 2. Toenails 1-5 b/l were debrided in length and girth without iatrogenic bleeding. 3. Patient to continue soft, supportive shoe gear.  We will order diabetic shoes for her.  We will need to find out what MD her nurse practitioner works under in order to get her diabetic shoes certification form completed. 4. Patient to report any pedal injuries to medical professional immediately. 5. Follow up 3 months. Patient/POA to call should there be a concern in the interim.

## 2018-09-12 ENCOUNTER — Ambulatory Visit: Payer: Medicare Other | Admitting: Internal Medicine

## 2018-09-16 ENCOUNTER — Ambulatory Visit (INDEPENDENT_AMBULATORY_CARE_PROVIDER_SITE_OTHER)
Admission: RE | Admit: 2018-09-16 | Discharge: 2018-09-16 | Disposition: A | Discharge status: Home / Self Care | Payer: Medicare Other | Source: Ambulatory Visit | Attending: Internal Medicine | Admitting: Internal Medicine

## 2018-09-16 ENCOUNTER — Encounter: Payer: Self-pay | Admitting: Internal Medicine

## 2018-09-16 ENCOUNTER — Ambulatory Visit: Payer: Medicare Other | Admitting: Internal Medicine

## 2018-09-16 ENCOUNTER — Ambulatory Visit (INDEPENDENT_AMBULATORY_CARE_PROVIDER_SITE_OTHER): Payer: Medicare Other | Admitting: Internal Medicine

## 2018-09-16 VITALS — BP 142/68 | HR 58 | Ht 67.0 in | Wt 194.4 lb

## 2018-09-16 DIAGNOSIS — R911 Solitary pulmonary nodule: Secondary | ICD-10-CM | POA: Diagnosis not present

## 2018-09-16 DIAGNOSIS — I2510 Atherosclerotic heart disease of native coronary artery without angina pectoris: Secondary | ICD-10-CM

## 2018-09-16 DIAGNOSIS — E782 Mixed hyperlipidemia: Secondary | ICD-10-CM

## 2018-09-16 DIAGNOSIS — R918 Other nonspecific abnormal finding of lung field: Secondary | ICD-10-CM | POA: Diagnosis not present

## 2018-09-16 DIAGNOSIS — I10 Essential (primary) hypertension: Secondary | ICD-10-CM | POA: Diagnosis not present

## 2018-09-16 DIAGNOSIS — I480 Paroxysmal atrial fibrillation: Secondary | ICD-10-CM | POA: Diagnosis not present

## 2018-09-16 MED ORDER — POTASSIUM CHLORIDE ER 10 MEQ PO TBCR: 10.0000 meq | EXTENDED_RELEASE_TABLET | Freq: Every day | ORAL | 3 refills | Status: DC

## 2018-09-16 NOTE — Progress Notes (Signed)
Cardiology Office Note   Date:  09/16/2018   ID:  [name], DOB 12-11-1935, MRN [id number]  PCP:  [name], FNP  Cardiologist:   Dorris Carnes, MD   F/U of PAF and HTN     History of Present Illness: [name] is a 82 y.o. female with a history of HTN, DM, coronary artery calcificatiosn and hot flashes  SHe also had an epsode in ED of atrial fib  CHADSVASc of 5      Myovuew with evidence of ischemia  Moderate anteriorly I recomm CT scan  This showed Ca score of 51  Modrate CAD of LAD   FFR done  Did not appear flow limiting     Small nodule seen on CT on R middle lobe    She was last in cardiology clinic in July 2019     Feeling good   Breathing pretty good   Denies palpitations    Glucose is  up and down   Last LDL in Oct was 50     Current Meds  Medication Sig  . acetaminophen (TYLENOL) 325 MG tablet Take 650 mg by mouth every 6 (six) hours as needed for pain.  Marland Kitchen albuterol (PROAIR HFA) 108 (90 Base) MCG/ACT inhaler Inhale 2 puffs into the lungs every 4 (four) hours as needed for wheezing.  Marland Kitchen amLODipine (NORVASC) 5 MG tablet TAKE 1 TABLET BY MOUTH TWICE A DAY  . apixaban (ELIQUIS) 2.5 MG TABS tablet Take 1 tablet (2.5 mg total) by mouth 2 (two) times daily.  . Blood Glucose Monitoring Suppl (ONE TOUCH ULTRA 2) W/DEVICE KIT Check blood sugar 3 times per day.  . clotrimazole (MYCELEX) 10 MG troche TAKE 1 TABLET (10 MG TOTAL) BY MOUTH 5 (FIVE) TIMES DAILY.  Marland Kitchen insulin lispro (HUMALOG) 100 UNIT/ML injection 5 units with breakfast, and 25 units with supper  . Insulin Syringe-Needle U-100 (BD INSULIN SYRINGE ULTRAFINE) 31G X 5/16" 0.3 ML MISC Use as directed   . losartan-hydrochlorothiazide (HYZAAR) 100-25 MG tablet Take 1 tablet by mouth daily.  . montelukast (SINGULAIR) 10 MG tablet TAKE 1 TABLET BY MOUTH AT BEDTIME  . omeprazole (PRILOSEC) 40 MG capsule Take 1 capsule (40 mg total) by mouth daily.  . ONE TOUCH LANCETS MISC Use as directed three times daily   .  potassium chloride (K-DUR) 10 MEQ tablet Take 10 mEq by mouth daily.  . pravastatin (PRAVACHOL) 40 MG tablet Take 1 tablet (40 mg total) by mouth daily.  . SYMBICORT 160-4.5 MCG/ACT inhaler INHALE 2 PUFFS INTO THE LUNGS TWO TIMES DAILY  . Vitamin D, Ergocalciferol, (DRISDOL) 50000 units CAPS capsule Take 1 capsule (50,000 Units total) by mouth every 7 (seven) days for 12 doses.        Allergies:   Aspirin and Ramipril   Past Medical History:  Diagnosis Date  . Allergic rhinitis   . Anterior chest wall pain   . Anxiety   . Asthma   . Cough   . DM type 2 (diabetes mellitus, type 2) ([place])   . GERD (gastroesophageal reflux disease)   . History of diverticulitis of colon   . HTN (hypertension)   . Hyperlipidemia   . IBS (irritable bowel syndrome)   . Iron deficiency anemia   . Osteoporosis, unspecified   . Renal insufficiency 07/31/2017  . UTI (urinary tract infection)     Past Surgical History:  Procedure Laterality Date  . CARDIOVASCULAR STRESS TEST  02/25/04  . ESOPHAGOGASTRODUODENOSCOPY  12/26/01  . RIGHT OOPHORECTOMY  jan 2010     Social History:  The patient  reports that she quit smoking about 31 years ago. Her smoking use included cigarettes. She has a 1.50 pack-year smoking history. She has never used smokeless tobacco. She reports that she does not drink alcohol or use drugs.   Family History:  The patient's family history includes Hypertension in her mother; Lung cancer in her brother; Melanoma in her brother; Other in her father; Stroke in her mother.    ROS:  Please see the history of present illness. All other systems are reviewed and  Negative to the above problem except as noted.    PHYSICAL EXAM: VS:  BP (!) 142/68   Pulse (!) 58   Ht '5\' 7"'  (1.702 m)   Wt 194 lb 6.4 oz (88.2 kg)   SpO2 97%   BMI 30.45 kg/m   GEN: Obese 82 yo  in no acute distress  HEENT: normal  Neck: JVP normal  NO, carotid bruits, or masses Cardiac: RRR; I-II/VI systolic murmur at  LSB   No rubs, or gallops,no edema  Respiratory:  clear to auscultation bilaterally, normal work of breathing GI: soft, nontender, nondistended, + BS  No hepatomegaly  MS: no deformity Moving all extremities   Skin: warm and dry, no rash Neuro:  Strength and sensation are intact Psych: euthymic mood, full affect   EKG:  EKG is  Not ordered today.    Lipid Panel    Component Value Date/Time   CHOL 114 07/08/2018 0901   TRIG 65.0 07/08/2018 0901   TRIG 83 08/07/2006 1419   HDL 51.10 07/08/2018 0901   CHOLHDL 2 07/08/2018 0901   VLDL 13.0 07/08/2018 0901   LDLCALC 50 07/08/2018 0901      Wt Readings from Last 3 Encounters:  09/16/18 194 lb 6.4 oz (88.2 kg)  07/08/18 193 lb 1.9 oz (87.6 kg)  06/07/18 195 lb (88.5 kg)      ASSESSMENT AND PLAN:  1  Atrial fib   HR regular  Keep on same regimen  ON Eliquis  CBC is OK    2  HTN BP is fair  Continue current meds   3  HL  Cont pravastatin  2.  CAD   No CP or dyspnea   Follow   5  Abnormal chest CT   F/U CT today for nodule   Disposition:   FU with  in  May/JUNe   Signed, Dorris Carnes, MD  09/16/2018 8:20 AM    [facility] [address], [place], [place]  [address] Phone: [phone]; Fax: [phone]

## 2018-09-16 NOTE — Patient Instructions (Signed)
Medication Instructions:  No change If you need a refill on your cardiac medications before your next appointment, please call your pharmacy.   Lab work: No change If you have labs (blood work) drawn today and your tests are completely normal, you will receive your results only by: Marland Kitchen MyChart Message (if you have MyChart) OR . A paper copy in the mail If you have any lab test that is abnormal or we need to change your treatment, we will call you to review the results.  Testing/Procedures: CT - today  Follow-Up: At [facility], you and your health needs are our priority.  As part of our continuing mission to provide you with exceptional heart care, we have created designated Provider Care Teams.  These Care Teams include your primary Cardiologist (physician) and Advanced Practice Providers (APPs -  Physician Assistants and Nurse Practitioners) who all work together to provide you with the care you need, when you need it. You will need a follow up appointment in:  5 months.  Please call our office 2 months in advance to schedule this appointment.  You may see Dorris Carnes, MD or one of the following Advanced Practice Providers on your designated Care Team: Richardson Dopp, PA-C [place], [place] . Daune Perch, NP  Any Other Special Instructions Will Be Listed Below (If Applicable).

## 2018-10-07 ENCOUNTER — Ambulatory Visit: Payer: Medicare Other | Admitting: Orthotics

## 2018-10-07 DIAGNOSIS — L84 Corns and callosities: Secondary | ICD-10-CM

## 2018-10-07 DIAGNOSIS — E119 Type 2 diabetes mellitus without complications: Secondary | ICD-10-CM

## 2018-10-07 NOTE — Progress Notes (Signed)
Patient will come back after she sees Dr. Darnelle Going in March

## 2018-10-17 ENCOUNTER — Other Ambulatory Visit: Payer: Self-pay | Admitting: Endocrinology

## 2018-10-17 NOTE — Telephone Encounter (Signed)
Please advise if refill is appropriate 

## 2018-10-17 NOTE — Telephone Encounter (Signed)
Please forward refill request to pt's new primary care provider.  

## 2018-12-02 ENCOUNTER — Other Ambulatory Visit: Payer: Self-pay | Admitting: Internal Medicine

## 2018-12-10 ENCOUNTER — Ambulatory Visit: Payer: Medicare Other | Admitting: Podiatry

## 2019-01-07 ENCOUNTER — Other Ambulatory Visit: Payer: Self-pay | Admitting: Endocrinology

## 2019-01-08 ENCOUNTER — Other Ambulatory Visit: Payer: Self-pay | Admitting: Internal Medicine

## 2019-01-14 ENCOUNTER — Telehealth: Payer: Self-pay | Admitting: Internal Medicine

## 2019-01-14 MED ORDER — ZAFIRLUKAST 20 MG PO TABS: 20.0000 mg | ORAL_TABLET | Freq: Two times a day (BID) | ORAL | 2 refills | Status: DC

## 2019-01-14 MED ORDER — PREDNISONE 10 MG PO TABS: ORAL_TABLET | ORAL | 0 refills | Status: DC

## 2019-01-14 NOTE — Telephone Encounter (Signed)
Called and spoke with pt stating to her the info per MW in regards to [place] sending a  Rx of accolate and a pred taper to her pharmacy as well for her to have in case her breathing became worse. Pt expressed understanding and stated to go ahead and send both Rx in to pharmacy for her. I verified pt's preferred pharmacy and sent both meds in for pt. Nothing further needed.

## 2019-01-14 NOTE — Telephone Encounter (Signed)
Called and spoke with pt. Pt stated that montelukast is on backorder at her pharmacy and she is wanting to know what she should do in regards to this. Dr. Melvyn Novas, please advise on this for pt. Thanks!

## 2019-01-14 NOTE — Telephone Encounter (Signed)
Could try accolate 20 mg bid ac (not sure it's on the market or non-generic singulair )  If breathing gets worse while waiting on rx can always offer Prednisone 10 mg take  4 each am x 2 days,   2 each am x 2 days,  1 each am x 2 days and stop

## 2019-02-03 ENCOUNTER — Other Ambulatory Visit: Payer: Self-pay | Admitting: Internal Medicine

## 2019-02-05 ENCOUNTER — Telehealth: Payer: Self-pay | Admitting: Internal Medicine

## 2019-02-05 NOTE — Telephone Encounter (Signed)
Pt. Says that her daughter has a smartphone so she would like for someone to call her before the appt so her daughter can assist her with the visit. .   Virtual Visit Pre-Appointment Phone Call  "(Name), I am calling you today to discuss your upcoming appointment. We are currently trying to limit exposure to the virus that causes COVID-19 by seeing patients at home rather than in the office."  1. "What is the BEST phone number to call the day of the visit?" - include this in appointment notes  2. Do you have or have access to (through a family member/friend) a smartphone with video capability that we can use for your visit?" a. If yes - list this number in appt notes as cell (if different from BEST phone #) and list the appointment type as a VIDEO visit in appointment notes b. If no - list the appointment type as a PHONE visit in appointment notes  3. Confirm consent - "In the setting of the current Covid19 crisis, you are scheduled for a (phone or video) visit with your provider on (date) at (time).  Just as we do with many in-office visits, in order for you to participate in this visit, we must obtain consent.  If you'd like, I can send this to your mychart (if signed up) or email for you to review.  Otherwise, I can obtain your verbal consent now.  All virtual visits are billed to your insurance company just like a normal visit would be.  By agreeing to a virtual visit, we'd like you to understand that the technology does not allow for your provider to perform an examination, and thus may limit your provider's ability to fully assess your condition. If your provider identifies any concerns that need to be evaluated in person, we will make arrangements to do so.  Finally, though the technology is pretty good, we cannot assure that it will always work on either your or our end, and in the setting of a video visit, we may have to convert it to a phone-only visit.  In either situation, we cannot  ensure that we have a secure connection.  Are you willing to proceed?" YES  4. Advise patient to be prepared - "Two hours prior to your appointment, go ahead and check your blood pressure, pulse, oxygen saturation, and your weight (if you have the equipment to check those) and write them all down. When your visit starts, your provider will ask you for this information. If you have an Apple Watch or Kardia device, please plan to have heart rate information ready on the day of your appointment. Please have a pen and paper handy nearby the day of the visit as well."  5. Give patient instructions for MyChart download to smartphone OR Doximity/Doxy.me as below if video visit (depending on what platform provider is using)  6. Inform patient they will receive a phone call 15 minutes prior to their appointment time (may be from unknown caller ID) so they should be prepared to answer    TELEPHONE CALL NOTE  [name] has been deemed a candidate for a follow-up tele-health visit to limit community exposure during the Covid-19 pandemic. I spoke with the patient via phone to ensure availability of phone/video source, confirm preferred email & phone number, and discuss instructions and expectations.  I reminded [name] to be prepared with any vital sign and/or heart rhythm information that could potentially be obtained via home monitoring, at  the time of her visit. I reminded [name] to expect a phone call prior to her visit.  [name] 02/05/2019 2:06 PM   INSTRUCTIONS FOR DOWNLOADING THE MYCHART APP TO SMARTPHONE  - The patient must first make sure to have activated MyChart and know their login information - If Apple, go to [employer] and type in MyChart in the search bar and download the app. If Android, ask patient to go to [employer] and type in [facility] in the search bar and download the app. The app is free but as with any other app downloads, their phone may require them to  verify saved payment information or Apple/Android password.  - The patient will need to then log into the app with their MyChart username and password, and select [facility] as their healthcare provider to link the account. When it is time for your visit, go to the MyChart app, find appointments, and click Begin Video Visit. Be sure to Select Allow for your device to access the Microphone and Camera for your visit. You will then be connected, and your provider will be with you shortly.  **If they have any issues connecting, or need assistance please contact MyChart service desk [phone])**  **If using a computer, in order to ensure the best quality for their visit they will need to use either of the following Internet Browsers: [employer], or Google Chrome**  IF USING DOXIMITY or DOXY.ME - The patient will receive a link just prior to their visit by text.     FULL LENGTH CONSENT FOR TELE-HEALTH VISIT   I hereby voluntarily request, consent and authorize [facility] and its employed or contracted physicians, physician assistants, nurse practitioners or other licensed health care professionals (the Practitioner), to provide me with telemedicine health care services (the Services") as deemed necessary by the treating Practitioner. I acknowledge and consent to receive the Services by the Practitioner via telemedicine. I understand that the telemedicine visit will involve communicating with the Practitioner through live audiovisual communication technology and the disclosure of certain medical information by electronic transmission. I acknowledge that I have been given the opportunity to request an in-person assessment or other available alternative prior to the telemedicine visit and am voluntarily participating in the telemedicine visit.  I understand that I have the right to withhold or withdraw my consent to the use of telemedicine in the course of my care at any time,  without affecting my right to future care or treatment, and that the Practitioner or I may terminate the telemedicine visit at any time. I understand that I have the right to inspect all information obtained and/or recorded in the course of the telemedicine visit and may receive copies of available information for a reasonable fee.  I understand that some of the potential risks of receiving the Services via telemedicine include:   Delay or interruption in medical evaluation due to technological equipment failure or disruption;  Information transmitted may not be sufficient (e.g. poor resolution of images) to allow for appropriate medical decision making by the Practitioner; and/or   In rare instances, security protocols could fail, causing a breach of personal health information.  Furthermore, I acknowledge that it is my responsibility to provide information about my medical history, conditions and care that is complete and accurate to the best of my ability. I acknowledge that Practitioner's advice, recommendations, and/or decision may be based on factors not within their control, such as incomplete or inaccurate data provided  by me or distortions of diagnostic images or specimens that may result from electronic transmissions. I understand that the practice of medicine is not an exact science and that Practitioner makes no warranties or guarantees regarding treatment outcomes. I acknowledge that I will receive a copy of this consent concurrently upon execution via email to the email address I last provided but may also request a printed copy by calling the office of [facility].    I understand that my insurance will be billed for this visit.   I have read or had this consent read to me.  I understand the contents of this consent, which adequately explains the benefits and risks of the Services being provided via telemedicine.   I have been provided ample opportunity to ask questions regarding  this consent and the Services and have had my questions answered to my satisfaction.  I give my informed consent for the services to be provided through the use of telemedicine in my medical care  By participating in this telemedicine visit I agree to the above.

## 2019-02-20 ENCOUNTER — Encounter: Payer: Self-pay | Admitting: Internal Medicine

## 2019-02-20 ENCOUNTER — Telehealth (INDEPENDENT_AMBULATORY_CARE_PROVIDER_SITE_OTHER): Payer: Medicare Other | Admitting: Internal Medicine

## 2019-02-20 ENCOUNTER — Other Ambulatory Visit: Payer: Self-pay

## 2019-02-20 VITALS — Ht 67.0 in | Wt 189.0 lb

## 2019-02-20 DIAGNOSIS — E782 Mixed hyperlipidemia: Secondary | ICD-10-CM

## 2019-02-20 DIAGNOSIS — I480 Paroxysmal atrial fibrillation: Secondary | ICD-10-CM | POA: Diagnosis not present

## 2019-02-20 DIAGNOSIS — I10 Essential (primary) hypertension: Secondary | ICD-10-CM

## 2019-02-20 DIAGNOSIS — I2510 Atherosclerotic heart disease of native coronary artery without angina pectoris: Secondary | ICD-10-CM

## 2019-02-20 NOTE — Progress Notes (Signed)
Virtual Visit via Telephone Note   This visit type was conducted due to national recommendations for restrictions regarding the COVID-19 Pandemic (e.g. social distancing) in an effort to limit this patient's exposure and mitigate transmission in our community.  Due to her co-morbid illnesses, this patient is at least at moderate risk for complications without adequate follow up.  This format is felt to be most appropriate for this patient at this time.  The patient did not have access to video technology/had technical difficulties with video requiring transitioning to audio format only (telephone).  All issues noted in this document were discussed and addressed.  No physical exam could be performed with this format.  Please refer to the patient's chart for her  consent to telehealth for [facility].   Date:  02/20/2019   ID:  [name], DOB 1936-09-07, MRN [id number]  Patient Location: Home Provider Location: Home  PCP:  [name], [place]  Cardiologist:  Dorris Carnes, MD  Electrophysiologist:  None   Evaluation Performed:  Follow-Up Visit  Chief Complaint:    History of Present Illness:    [name] is a 83 y.o. female with HTN, DM, coronary calcifications and PAF   Myovue in showed anterior ischemia   Coronary CT scan in 08/2016:  Ca score 51   Moderate CAD of LAD but FFR normal.  L atrial appendage with possible filling defect   SMall nodule LLLobe  Unchanged   On repeat CT in Dec 2019 I saw her last in December 2019    Since seen she has done well   Lives alone   Daughter is getitng her groceries Blood sugars are up and down   Not eating normally    With current pandemic  Breathing is OK   No CP   No dizziness   No palpitations      The patient does not have symptoms concerning for COVID-19 infection (fever, chills, cough, or new shortness of breath).    Past Medical History:  Diagnosis Date  . Allergic rhinitis   . Anterior chest wall pain   . Anxiety   .  Asthma   . Cough   . DM type 2 (diabetes mellitus, type 2) ([place])   . GERD (gastroesophageal reflux disease)   . History of diverticulitis of colon   . HTN (hypertension)   . Hyperlipidemia   . IBS (irritable bowel syndrome)   . Iron deficiency anemia   . Osteoporosis, unspecified   . Renal insufficiency 07/31/2017  . UTI (urinary tract infection)    Past Surgical History:  Procedure Laterality Date  . CARDIOVASCULAR STRESS TEST  02/25/04  . ESOPHAGOGASTRODUODENOSCOPY  12/26/01  . RIGHT OOPHORECTOMY  jan 2010     Current Meds  Medication Sig  . acetaminophen (TYLENOL) 325 MG tablet Take 650 mg by mouth every 6 (six) hours as needed for pain.  Marland Kitchen albuterol (PROAIR HFA) 108 (90 Base) MCG/ACT inhaler Inhale 2 puffs into the lungs every 4 (four) hours as needed for wheezing.  Marland Kitchen amLODipine (NORVASC) 5 MG tablet TAKE 1 TABLET BY MOUTH TWICE A DAY  . Blood Glucose Monitoring Suppl (ONE TOUCH ULTRA 2) W/DEVICE KIT Check blood sugar 3 times per day.  Marland Kitchen ELIQUIS 2.5 MG TABS tablet TAKE 1 TABLET BY MOUTH TWICE A DAY  . insulin lispro (HUMALOG) 100 UNIT/ML injection 5 units with breakfast, and 25 units with supper  . Insulin Syringe-Needle U-100 (BD INSULIN SYRINGE ULTRAFINE) 31G X 5/16" 0.3  ML MISC Use as directed   . losartan-hydrochlorothiazide (HYZAAR) 100-25 MG tablet Take 1 tablet by mouth daily.  . montelukast (SINGULAIR) 10 MG tablet TAKE 1 TABLET BY MOUTH EVERYDAY AT BEDTIME  . omeprazole (PRILOSEC) 40 MG capsule Take 1 capsule (40 mg total) by mouth daily.  . ONE TOUCH LANCETS MISC Use as directed three times daily   . potassium chloride (K-DUR) 10 MEQ tablet Take 1 tablet (10 mEq total) by mouth daily.  . pravastatin (PRAVACHOL) 40 MG tablet Take 1 tablet (40 mg total) by mouth daily.  . SYMBICORT 160-4.5 MCG/ACT inhaler INHALE 2 PUFFS INTO THE LUNGS TWO TIMES DAILY  . zafirlukast (ACCOLATE) 20 MG tablet Take 1 tablet (20 mg total) by mouth 2 (two) times daily before a meal.      Allergies:   Aspirin and Ramipril   Social History   Tobacco Use  . Smoking status: Former Smoker    Packs/day: 0.30    Years: 5.00    Pack years: 1.50    Types: Cigarettes    Last attempt to quit: 10/02/1986    Years since quitting: 32.4  . Smokeless tobacco: Never Used  Substance Use Topics  . Alcohol use: No  . Drug use: No     Family Hx: The patient's family history includes Hypertension in her mother; Lung cancer in her brother; Melanoma in her brother; Other in her father; Stroke in her mother. There is no history of Atopy, Asthma, or Breast cancer.  ROS:   Please see the history of present illness.     All other systems reviewed and are negative.   Prior CV studies:   The following studies were reviewed today:    Labs/Other Tests and Data Reviewed:    EKG:  No ECG reviewed.  Recent Labs: 03/22/2018: Hemoglobin 12.0; Platelets 188 07/08/2018: ALT 13; BUN 24; Creatinine, Ser 1.59; Potassium 3.9; Sodium 142   Recent Lipid Panel Lab Results  Component Value Date/Time   CHOL 114 07/08/2018 09:01 AM   TRIG 65.0 07/08/2018 09:01 AM   TRIG 83 08/07/2006 02:19 PM   HDL 51.10 07/08/2018 09:01 AM   CHOLHDL 2 07/08/2018 09:01 AM   LDLCALC 50 07/08/2018 09:01 AM    Wt Readings from Last 3 Encounters:  02/20/19 189 lb (85.7 kg)  09/16/18 194 lb 6.4 oz (88.2 kg)  07/08/18 193 lb 1.9 oz (87.6 kg)     Objective:    Vital Signs:  Ht _0  (1.702 m)   Wt 189 lb (85.7 kg)   BMI 29.60 kg/m    VITAL SIGNS:  reviewed  ASSESSMENT & PLAN:    1. PAF  Pt denies palpitations   COntinue Eliquis  F/U labs  2   CAD   Not flow limiting by CT scan in 2017    Continue current meds   Denies symptoms  3   HTN   BP not taken today   She does not have cuff   I have encouraged her to get one and follow     Check labs   Call if BPs running above 140s  4   HL  WIll check lipids   On statin    5  COVID-19 Education: The signs and symptoms of COVID-19 were discussed with the  patient and how to seek care for testing (follow up with PCP or arrange E-visit).  The importance of social distancing was discussed today.  Time:   Today, I have spent 25 minutes with  the patient with telehealth technology discussing the above problems.     Medication Adjustments/Labs and Tests Ordered: Current medicines are reviewed at length with the patient today.  Concerns regarding medicines are outlined above.   Tests Ordered: No orders of the defined types were placed in this encounter.   Medication Changes: No orders of the defined types were placed in this encounter.   Disposition:  Follow up in 6 months  Signed, Dorris Carnes, MD  02/20/2019 8:14 AM    [place]

## 2019-02-20 NOTE — Patient Instructions (Signed)
Medication Instructions:  No changes If you need a refill on your cardiac medications before your next appointment, please call your pharmacy.   Lab work: At your convenience, prior to your next appointment, please schedule lab appointment: Bmet, cbc, tsh, hgA1c, lipids  Testing/Procedures: none  Follow-Up: At [facility], you and your health needs are our priority.  As part of our continuing mission to provide you with exceptional heart care, we have created designated Provider Care Teams.  These Care Teams include your primary Cardiologist (physician) and Advanced Practice Providers (APPs -  Physician Assistants and Nurse Practitioners) who all work together to provide you with the care you need, when you need it. You will need a follow up appointment in:  6 months.  Please call our office 2 months in advance to schedule this appointment.  You may see Dorris Carnes, MD or one of the following Advanced Practice Providers on your designated Care Team: Richardson Dopp, PA-C [place], [place] . Daune Perch, NP  Any Other Special Instructions Will Be Listed Below (If Applicable).

## 2019-02-20 NOTE — Addendum Note (Signed)
Addended by: Rodman Key on: 02/20/2019 09:33 AM   Modules accepted: Orders

## 2019-02-28 ENCOUNTER — Other Ambulatory Visit: Payer: Self-pay | Admitting: Internal Medicine

## 2019-03-26 ENCOUNTER — Other Ambulatory Visit: Payer: Self-pay | Admitting: Internal Medicine

## 2019-04-06 ENCOUNTER — Other Ambulatory Visit: Payer: Self-pay | Admitting: Endocrinology

## 2019-04-06 NOTE — Telephone Encounter (Signed)
Please forward refill request to pt's new primary care provider.  

## 2019-04-13 ENCOUNTER — Other Ambulatory Visit: Payer: Self-pay | Admitting: Internal Medicine

## 2019-04-17 ENCOUNTER — Telehealth: Payer: Self-pay | Admitting: Internal Medicine

## 2019-04-17 ENCOUNTER — Other Ambulatory Visit: Payer: Self-pay | Admitting: Internal Medicine

## 2019-04-17 NOTE — Telephone Encounter (Signed)
Prescription refill request for Eliquis received.  Last office visit: Dr. Harrington Challenger ( 02-20-2019) Scr: 1.59 ( 07-08-2018) Age: 83 y.o. Weight: 88.2 kg (09-16-2018)  Prescription refill sent.

## 2019-04-17 NOTE — Telephone Encounter (Signed)
Called and spoke with patient. Let her know she hasn't been seen in our office in a year and after this refill she would need to be seen. I sent in 1 refill for respiratory medication to pharmacy. And schedule appt with MW per patient preference for 05/03/19 at 1515.  Nothing further needed at this time.

## 2019-04-21 ENCOUNTER — Other Ambulatory Visit: Payer: Self-pay | Admitting: Internal Medicine

## 2019-05-05 ENCOUNTER — Other Ambulatory Visit: Payer: Self-pay

## 2019-05-05 ENCOUNTER — Encounter: Payer: Self-pay | Admitting: Internal Medicine

## 2019-05-05 ENCOUNTER — Ambulatory Visit: Payer: Medicare Other | Admitting: Internal Medicine

## 2019-05-05 DIAGNOSIS — J45991 Cough variant asthma: Secondary | ICD-10-CM | POA: Diagnosis not present

## 2019-05-05 MED ORDER — ALBUTEROL SULFATE HFA 108 (90 BASE) MCG/ACT IN AERS: 2.0000 | INHALATION_SPRAY | RESPIRATORY_TRACT | 11 refills | Status: DC | PRN

## 2019-05-05 MED ORDER — MONTELUKAST SODIUM 10 MG PO TABS: ORAL_TABLET | ORAL | 1 refills | Status: DC

## 2019-05-05 MED ORDER — BUDESONIDE-FORMOTEROL FUMARATE 160-4.5 MCG/ACT IN AERO: 2.0000 | INHALATION_SPRAY | Freq: Two times a day (BID) | RESPIRATORY_TRACT | 0 refills | Status: DC

## 2019-05-05 MED ORDER — BUDESONIDE-FORMOTEROL FUMARATE 160-4.5 MCG/ACT IN AERO: INHALATION_SPRAY | RESPIRATORY_TRACT | 11 refills | Status: DC

## 2019-05-05 NOTE — Progress Notes (Signed)
Subjective:     Patient ID: [name], female   DOB: 1936/08/08 .   MRN: [id number]   Brief patient profile:  72 yobf quit smoking 1988 with h/o asthma  And nl baseline pfts ( as of 01/04/2010) who had been using Advair on a p.r.n. basis and noticing increasing dyspnea and need for rescue therapy x one mo when seen 01/06/08 for pulmonary evaluation so Advair stopped. Began Symbicort 160/4.93mg 2 puffs two times a day.  Returned 02/13/08 improved with decreased dyspnea and no rescue use   Returned 03/18/08 symptom free no rescue needed, stopped reglan, no flare    01/25/2017  f/u ov/Lonald Troiani re: asthma / ? Allergic rhinitis  rx symb160/singulair  Chief Complaint  Patient presents with  . Follow-up    Last seen 2015. She c/o minimal wheezing for the past wk "b/c of the pollen". She also has minimal dry cough.   She is using proair 2 x per wk on average.   only uses alb if overdoes it - never noct needed Doe = MMRC2 = can't walk a nl pace on a flat grade s sob but does fine slow and flat eg shopping rec Prednisone 10 mg take  4 each am x 2 days,   2 each am x 2 days,  1 each am x 2 days and stop  For cough > mucinex dm up to 1200 mg every 12 hours and add Try prilosec otc 264m Take 30-60 min before first meal of the day and Pepcid ac (famotidine) 20 mg one @  bedtime until cough is completely gone for at least a week without the need for cough suppression GERD diet   01/28/2018  f/u ov/Justan Gaede re:  Asthma  Yearly f/u maiint on symb 160 2bid and singulair  Chief Complaint  Patient presents with  . Follow-up    Reports increased sneezing and dry cough with increase of pollen.   Dyspnea:  MMRC2 = can't walk a nl pace on a flat grade s sob but does fine slow and flat walking at mall  Cough: correlates with sneezing outdoors only Sleep: no resp symptoms SABA use:  Maybe twice a week  rec For itching/ sneezing > zyrtec 10 mg one at bedtime (over the counter) Work on inhaler technique:   Keep walking  regularly and see if using your albuterol 15 min before helps you do more  Or not> usually does not.   04/29/2018  f/u ov/Maleigh Bagot re:  Asthma / rhinitis improved with prn zyrtec maint symb/singulair Chief Complaint  Patient presents with  . Follow-up    Pt states things have been doing good since last visit. Pt has had to use her rescue inhaler but states she has not had to use it that often and when she has used it, she states it is only used once in a day's time.    Dyspnea:  MMRC2 = can't walk a nl pace on a flat grade s sob but does fine slow and flat  Cough: varies daytime mostly and dry  SABA use: before exertion sometimes  02: none  rec Work on peDoctor, hospitalechnique:     05/05/2019  f/u ov/Louanne Calvillo re: asthma/ rhinitis maint symb/ singulair  Chief Complaint  Patient presents with  . Follow-up    Breathing overall doing well. She is using her rescue inhaler once per day on average.    Dyspnea:  MMRC2 = can't walk a nl pace on a flat grade s  sob but does fine slow and flat  Cough: minimal Sleeping: ok 2 pillows  SABA use: maybe once a day when exerting  02: none    No obvious day to day or daytime variability or assoc excess/ purulent sputum or mucus plugs or hemoptysis or cp or chest tightness, subjective wheeze or overt sinus or hb symptoms.   Sleeping  without nocturnal  or early am exacerbation  of respiratory  c/o's or need for noct saba. Also denies any obvious fluctuation of symptoms with weather or environmental changes or other aggravating or alleviating factors except as outlined above   No unusual exposure hx or h/o childhood pna/ asthma or knowledge of premature birth.  Current Allergies, Complete Past Medical History, Past Surgical History, Family History, and Social History were reviewed in [employer] record.  ROS  The following are not active complaints unless bolded Hoarseness, sore throat, dysphagia, dental problems, itching, sneezing,   nasal congestion or discharge of excess mucus or purulent secretions, ear ache,   fever, chills, sweats, unintended wt loss or wt gain, classically pleuritic or exertional cp,  orthopnea pnd or arm/hand swelling  or leg swelling, presyncope, palpitations, abdominal pain, anorexia, nausea, vomiting, diarrhea  or change in bowel habits or change in bladder habits, change in stools or change in urine, dysuria, hematuria,  rash, arthralgias, visual complaints, headache, numbness, weakness or ataxia or problems with walking or coordination,  change in mood or  memory.        Current Meds  Medication Sig  . acetaminophen (TYLENOL) 325 MG tablet Take 650 mg by mouth every 6 (six) hours as needed for pain.  Marland Kitchen amLODipine (NORVASC) 5 MG tablet TAKE 1 TABLET BY MOUTH TWICE A DAY  . Blood Glucose Monitoring Suppl (ONE TOUCH ULTRA 2) W/DEVICE KIT Check blood sugar 3 times per day.  Marland Kitchen ELIQUIS 2.5 MG TABS tablet TAKE 1 TABLET BY MOUTH TWICE A DAY  . insulin lispro (HUMALOG) 100 UNIT/ML injection 5 units with breakfast, and 25 units with supper  . Insulin Syringe-Needle U-100 (BD INSULIN SYRINGE ULTRAFINE) 31G X 5/16" 0.3 ML MISC Use as directed   . losartan-hydrochlorothiazide (HYZAAR) 100-25 MG tablet TAKE 1 TABLET BY MOUTH EVERY DAY  . montelukast (SINGULAIR) 10 MG tablet TAKE 1 TABLET BY MOUTH EVERYDAY AT BEDTIME  . omeprazole (PRILOSEC) 40 MG capsule Take 1 capsule (40 mg total) by mouth daily.  . ONE TOUCH LANCETS MISC Use as directed three times daily   . potassium chloride (K-DUR) 10 MEQ tablet Take 1 tablet (10 mEq total) by mouth daily.  . pravastatin (PRAVACHOL) 40 MG tablet Take 1 tablet (40 mg total) by mouth daily.  . SYMBICORT 160-4.5 MCG/ACT inhaler INHALE 2 PUFFS INTO THE LUNGS TWO TIMES DAILY  .                  Past Medical History:   OSTEOPOROSIS (ICD-733.00)  HYPERTENSION (ICD-401.9)  HYPERLIPIDEMIA (ICD-272.4)  DIVERTICULITIS, HX OF (ICD-V12.79)  DIABETES MELLITUS, TYPE II  (ICD-250.00)  COLON CANCER, HX OF (ICD-V10.05)  ASTHMA (ICD-493.90)  -PFT's 03/18/08 minimal airflow obstruction  -PFT's January 04, 2010 No sign airflow obstruction  -Mastered HFA technique August 13, 2008 > confimed November 23, 2009  ANXIETY (ICD-300.00)  ANEMIA-IRON DEFICIENCY (ICD-280.9)  ALLERGIC RHINITIS (ICD-477.9)  IBS  GERD            Objective:   Physical Exam   wt 202 November 23, 2009> 199 January 04, 2010 > 209  03/22/11 > 214 10/16/2013 >  01/25/2017   193> 01/28/2018   200 > 04/29/2018   194 > 05/05/2019    196      amb pleasant bf nad mild pseudoweeze resolves with plm   Vital signs reviewed - Note on arrival 02 sats  100% on RA  HEENT: nl dentition, turbinates bilaterally, and oropharynx. Nl external ear canals without cough reflex   NECK :  without JVD/Nodes/TM/ nl carotid upstrokes bilaterally   LUNGS: no acc muscle use,  Nl contour chest which is clear to A and P bilaterally without cough on insp or exp maneuvers   CV:  RRR  no s3 or murmur or increase in P2, and no edema   ABD: obese  soft and nontender with nl inspiratory excursion in the supine position. No bruits or organomegaly appreciated, bowel sounds nl  MS:  Nl gait/ ext warm without deformities, calf tenderness, cyanosis or clubbing No obvious joint restrictions   SKIN: warm and dry without lesions    NEURO:  alert, approp, nl sensorium with  no motor or cerebellar deficits apparent.               Assessment:

## 2019-05-05 NOTE — Patient Instructions (Signed)
Work on inhaler technique:  relax and gently blow all the way out then take a nice smooth deep breath back in, triggering the inhaler at same time you start breathing in.  Hold for up to 5 seconds if you can. Blow out thru nose. Rinse and gargle with water when done      Please schedule a follow up visit in 12 months but call sooner if needed

## 2019-05-06 ENCOUNTER — Encounter: Payer: Self-pay | Admitting: Internal Medicine

## 2019-05-06 NOTE — Assessment & Plan Note (Signed)
PFT's 03/18/08 minimal airflow obstruction  -PFT's January 04, 2010 No sign airflow obstruction   - FENO 01/25/2017  =   23 p am symb 160 x 2 pffs - Spirometry 01/25/2017  wnl x for min curvature on f/v p am symb 160 x 2 / no saba prior - 07/30/2017   After extensive coaching HFA effectiveness =    75% from baseline 50%  - Allergy profile 07/30/17  >  Eos 0.2 /  IgE  145  RAST pos mold  - FENO 01/28/2018  =   17 - Spirometry 01/28/2018  FEV1 1.22 (65%)  Ratio 79  -  05/05/2019  After extensive coaching inhaler device,  effectiveness =    50%  Luckily the true asthmatic component of her problem is not that bad as she is losing ground with adequate hfa and needs to work harder at optimal technique by first practicing before each rx by using empty device "like a golf player swinging at an imaginary ball" before hitting the real thing.    I had an extended discussion with the patient reviewing all relevant studies completed to date and  lasting 15 to 20 minutes of a 25 minute visit    See device teaching which extended face to face time for this visit.  Each maintenance medication was reviewed in detail including emphasizing most importantly the difference between maintenance and prns and under what circumstances the prns are to be triggered using an action plan format that is not reflected in the computer generated alphabetically organized AVS which I have not found useful in most complex patients, especially with respiratory illnesses  Please see AVS for specific instructions unique to this visit that I personally wrote and verbalized to the the pt in detail and then reviewed with pt  by my nurse highlighting any  changes in therapy recommended at today's visit to their plan of care.

## 2019-06-04 ENCOUNTER — Telehealth: Payer: Self-pay | Admitting: Endocrinology

## 2019-06-04 NOTE — Telephone Encounter (Signed)
Per Dr. Loanne Drilling, unable to refill Humalog without an appt. Routing this message to the front desk for scheduling purposes.

## 2019-06-04 NOTE — Telephone Encounter (Signed)
LOV 06/07/18. Per your office note, you wished her well on her new PCP. LOV to f/u for DM was 05/17/18. No future appt noted. Please advise.

## 2019-06-04 NOTE — Telephone Encounter (Signed)
ATC pt line was busy need to call again

## 2019-06-04 NOTE — Telephone Encounter (Signed)
Options: Please refill x 1 F/u is due, or:  1.  Please schedule f/u appt 2.  Then please refill x 1, pending that appt.

## 2019-06-05 ENCOUNTER — Other Ambulatory Visit: Payer: Self-pay

## 2019-06-05 DIAGNOSIS — N183 Chronic kidney disease, stage 3 unspecified: Secondary | ICD-10-CM

## 2019-06-05 DIAGNOSIS — E1122 Type 2 diabetes mellitus with diabetic chronic kidney disease: Secondary | ICD-10-CM

## 2019-06-05 MED ORDER — INSULIN LISPRO 100 UNIT/ML ~~LOC~~ SOLN: SUBCUTANEOUS | 6 refills | Status: DC

## 2019-06-05 NOTE — Telephone Encounter (Signed)
Completed. Scheduled for 06/12/19 @ 8 a.m.

## 2019-06-05 NOTE — Telephone Encounter (Signed)
insulin lispro (HUMALOG) 100 UNIT/ML injection 20 mL 6 06/05/2019    Sig: 5 units with breakfast, and 25 units with supper   Sent to pharmacy as: insulin lispro (HUMALOG) 100 UNIT/ML injection   E-Prescribing Status: Receipt confirmed by pharmacy (06/05/2019 11:03 AM EDT)

## 2019-06-12 ENCOUNTER — Other Ambulatory Visit: Payer: Self-pay

## 2019-06-12 ENCOUNTER — Ambulatory Visit: Payer: Medicare Other | Admitting: Endocrinology

## 2019-06-12 ENCOUNTER — Encounter: Payer: Self-pay | Admitting: Endocrinology

## 2019-06-12 VITALS — BP 118/70 | HR 61 | Temp 95.7°F | Wt 196.0 lb

## 2019-06-12 DIAGNOSIS — Z23 Encounter for immunization: Secondary | ICD-10-CM

## 2019-06-12 DIAGNOSIS — E1122 Type 2 diabetes mellitus with diabetic chronic kidney disease: Secondary | ICD-10-CM

## 2019-06-12 DIAGNOSIS — N183 Chronic kidney disease, stage 3 (moderate): Secondary | ICD-10-CM | POA: Diagnosis not present

## 2019-06-12 DIAGNOSIS — Z794 Long term (current) use of insulin: Secondary | ICD-10-CM | POA: Diagnosis not present

## 2019-06-12 LAB — POCT GLYCOSYLATED HEMOGLOBIN (HGB A1C): Hemoglobin A1C: 6.2 % — AB (ref 4.0–5.6)

## 2019-06-12 MED ORDER — INSULIN LISPRO 100 UNIT/ML ~~LOC~~ SOLN: SUBCUTANEOUS | 6 refills | Status: DC

## 2019-06-12 NOTE — Progress Notes (Signed)
Subjective:    Patient ID: [name], female    DOB: 1936-05-04, 83 y.o.   MRN: [id number]  HPI Pt returns for f/u of diabetes mellitus: DM type: Insulin-requiring type 2 Dx'ed: 5747 Complications: renal insuff Therapy: insulin since 2008 GDM: never DKA: never Severe hypoglycemia: never Pancreatitis: never.  Other: she takes multiple daily injections.   Interval history: no cbg record, but states cbg varies from 150-200.  pt states she feels well in general.   Past Medical History:  Diagnosis Date  . Allergic rhinitis   . Anterior chest wall pain   . Anxiety   . Asthma   . Cough   . DM type 2 (diabetes mellitus, type 2) ([place])   . GERD (gastroesophageal reflux disease)   . History of diverticulitis of colon   . HTN (hypertension)   . Hyperlipidemia   . IBS (irritable bowel syndrome)   . Iron deficiency anemia   . Osteoporosis, unspecified   . Renal insufficiency 07/31/2017  . UTI (urinary tract infection)     Past Surgical History:  Procedure Laterality Date  . CARDIOVASCULAR STRESS TEST  02/25/04  . ESOPHAGOGASTRODUODENOSCOPY  12/26/01  . RIGHT OOPHORECTOMY  jan 2010    Social History   Socioeconomic History  . Marital status: Widowed    Spouse name: Not on file  . Number of children: Not on file  . Years of education: Not on file  . Highest education level: Not on file  Occupational History  . Occupation: retired  Scientific laboratory technician  . Financial resource strain: Not on file  . Food insecurity    Worry: Not on file    Inability: Not on file  . Transportation needs    Medical: Not on file    Non-medical: Not on file  Tobacco Use  . Smoking status: Former Smoker    Packs/day: 0.30    Years: 5.00    Pack years: 1.50    Types: Cigarettes    Quit date: 10/02/1986    Years since quitting: 32.7  . Smokeless tobacco: Never Used  Substance and Sexual Activity  . Alcohol use: No  . Drug use: No  . Sexual activity: Not on file  Lifestyle  . Physical activity    Days per week: Not on file    Minutes per session: Not on file  . Stress: Not on file  Relationships  . Social Herbalist on phone: Not on file    Gets together: Not on file    Attends religious service: Not on file    Active member of club or organization: Not on file    Attends meetings of clubs or organizations: Not on file    Relationship status: Not on file  . Intimate partner violence    Fear of current or ex partner: Not on file    Emotionally abused: Not on file    Physically abused: Not on file    Forced sexual activity: Not on file  Other Topics Concern  . Not on file  Social History Narrative  . Not on file    Current Outpatient Medications on File Prior to Visit  Medication Sig Dispense Refill  . acetaminophen (TYLENOL) 325 MG tablet Take 650 mg by mouth every 6 (six) hours as needed for pain.    Marland Kitchen albuterol (PROAIR HFA) 108 (90 Base) MCG/ACT inhaler Inhale 2 puffs into the lungs every 4 (four) hours as needed for wheezing. 18 g 11  .  amLODipine (NORVASC) 5 MG tablet TAKE 1 TABLET BY MOUTH TWICE A DAY 180 tablet 2  . Blood Glucose Monitoring Suppl (ONE TOUCH ULTRA 2) W/DEVICE KIT Check blood sugar 3 times per day. 1 each 0  . budesonide-formoterol (SYMBICORT) 160-4.5 MCG/ACT inhaler Take 2 puffs first thing in am and then another 2 puffs about 12 hours later. 10.2 Inhaler 11  . ELIQUIS 2.5 MG TABS tablet TAKE 1 TABLET BY MOUTH TWICE A DAY 180 tablet 1  . Insulin Syringe-Needle U-100 (BD INSULIN SYRINGE ULTRAFINE) 31G X 5/16" 0.3 ML MISC Use as directed     . losartan-hydrochlorothiazide (HYZAAR) 100-25 MG tablet TAKE 1 TABLET BY MOUTH EVERY DAY 90 tablet 3  . montelukast (SINGULAIR) 10 MG tablet One a at bedtime 90 tablet 1  . omeprazole (PRILOSEC) 40 MG capsule Take 1 capsule (40 mg total) by mouth daily. 30 capsule 3  . ONE TOUCH LANCETS MISC Use as directed three times daily     . potassium chloride (K-DUR) 10 MEQ tablet Take 1 tablet (10 mEq total) by  mouth daily. 90 tablet 3  . pravastatin (PRAVACHOL) 40 MG tablet Take 1 tablet (40 mg total) by mouth daily. 90 tablet 3   No current facility-administered medications on file prior to visit.     Allergies  Allergen Reactions  . Aspirin Other (See Comments)    Asthma  . Ramipril     REACTION: Cough    Family History  Problem Relation Age of Onset  . Lung cancer Brother   . Melanoma Brother   . Hypertension Mother   . Stroke Mother   . Other Father        poor circulation  . Atopy Neg Hx   . Asthma Neg Hx   . Breast cancer Neg Hx     BP 118/70   Pulse 61   Temp (!) 95.7 F (35.4 C) (Temporal)   Wt 196 lb (88.9 kg)   SpO2 99%   BMI 30.70 kg/m    Review of Systems She denies hypoglycemia.     Objective:   Physical Exam VITAL SIGNS:  See vs page GENERAL: no distress Pulses: dorsalis pedis intact bilat.   MSK: no deformity of the feet, except for a few hammer toes.   CV: no leg edema Skin:  no ulcer on the feet.  normal color and temp on the feet. Neuro: sensation is intact to touch on the feet Ext: there is bilateral onychomycosis of the toenails.    Lab Results  Component Value Date   CREATININE 1.59 (H) 07/08/2018   BUN 24 (H) 07/08/2018   NA 142 07/08/2018   K 3.9 07/08/2018   CL 107 07/08/2018   CO2 24 07/08/2018   Lab Results  Component Value Date   HGBA1C 6.2 (A) 06/12/2019       Assessment & Plan:  Insulin-requiring type 2 DM, with renal insuff: overcontrolled  Patient Instructions  Please reduce the insulin to 3 units with breakfast, and 22 units with supper check your blood sugar twice a day.  vary the time of day when you check, between before the 3 meals, and at bedtime.  also check if you have symptoms of your blood sugar being too high or too low.  please keep a record of the readings and bring it to your next appointment here (or you can bring the meter itself).  You can write it on any piece of paper.  please call us sooner if your  blood sugar goes below 70, or if you have a lot of readings over 200. Please come back for a follow-up appointment in 3 months.

## 2019-06-12 NOTE — Patient Instructions (Addendum)
Please reduce the insulin to 3 units with breakfast, and 22 units with supper check your blood sugar twice a day.  vary the time of day when you check, between before the 3 meals, and at bedtime.  also check if you have symptoms of your blood sugar being too high or too low.  please keep a record of the readings and bring it to your next appointment here (or you can bring the meter itself).  You can write it on any piece of paper.  please call us sooner if your blood sugar goes below 70, or if you have a lot of readings over 200. Please come back for a follow-up appointment in 3 months.

## 2019-08-18 ENCOUNTER — Other Ambulatory Visit: Payer: Self-pay | Admitting: Family

## 2019-08-18 DIAGNOSIS — Z1231 Encounter for screening mammogram for malignant neoplasm of breast: Secondary | ICD-10-CM

## 2019-08-27 ENCOUNTER — Other Ambulatory Visit (INDEPENDENT_AMBULATORY_CARE_PROVIDER_SITE_OTHER): Payer: Medicare Other

## 2019-08-27 ENCOUNTER — Other Ambulatory Visit: Payer: Self-pay

## 2019-08-27 ENCOUNTER — Encounter: Payer: Self-pay | Admitting: Family

## 2019-08-27 ENCOUNTER — Ambulatory Visit (INDEPENDENT_AMBULATORY_CARE_PROVIDER_SITE_OTHER): Payer: Medicare Other | Admitting: Family

## 2019-08-27 VITALS — BP 126/74 | HR 56 | Temp 98.5°F | Ht 67.0 in | Wt 195.8 lb

## 2019-08-27 DIAGNOSIS — I10 Essential (primary) hypertension: Secondary | ICD-10-CM | POA: Diagnosis not present

## 2019-08-27 DIAGNOSIS — E785 Hyperlipidemia, unspecified: Secondary | ICD-10-CM

## 2019-08-27 DIAGNOSIS — E559 Vitamin D deficiency, unspecified: Secondary | ICD-10-CM

## 2019-08-27 DIAGNOSIS — R5383 Other fatigue: Secondary | ICD-10-CM | POA: Diagnosis not present

## 2019-08-27 DIAGNOSIS — I480 Paroxysmal atrial fibrillation: Secondary | ICD-10-CM | POA: Diagnosis not present

## 2019-08-27 LAB — CBC WITH DIFFERENTIAL/PLATELET
Basophils Absolute: 0 10*3/uL (ref 0.0–0.1)
Basophils Relative: 0.7 % (ref 0.0–3.0)
Eosinophils Absolute: 0.4 10*3/uL (ref 0.0–0.7)
Eosinophils Relative: 5.4 % — ABNORMAL HIGH (ref 0.0–5.0)
HCT: 34.4 % — ABNORMAL LOW (ref 36.0–46.0)
Hemoglobin: 11.5 g/dL — ABNORMAL LOW (ref 12.0–15.0)
Lymphocytes Relative: 16.4 % (ref 12.0–46.0)
Lymphs Abs: 1.1 10*3/uL (ref 0.7–4.0)
MCHC: 33.4 g/dL (ref 30.0–36.0)
MCV: 82.3 fl (ref 78.0–100.0)
Monocytes Absolute: 0.7 10*3/uL (ref 0.1–1.0)
Monocytes Relative: 9.9 % (ref 3.0–12.0)
Neutro Abs: 4.5 10*3/uL (ref 1.4–7.7)
Neutrophils Relative %: 67.6 % (ref 43.0–77.0)
Platelets: 161 10*3/uL (ref 150.0–400.0)
RBC: 4.18 Mil/uL (ref 3.87–5.11)
RDW: 16.9 % — ABNORMAL HIGH (ref 11.5–15.5)
WBC: 6.7 10*3/uL (ref 4.0–10.5)

## 2019-08-27 LAB — COMPREHENSIVE METABOLIC PANEL
ALT: 11 U/L (ref 0–35)
AST: 13 U/L (ref 0–37)
Albumin: 3.7 g/dL (ref 3.5–5.2)
Alkaline Phosphatase: 75 U/L (ref 39–117)
BUN: 23 mg/dL (ref 6–23)
CO2: 21 mEq/L (ref 19–32)
Calcium: 9.3 mg/dL (ref 8.4–10.5)
Chloride: 110 mEq/L (ref 96–112)
Creatinine, Ser: 1.57 mg/dL — ABNORMAL HIGH (ref 0.40–1.20)
GFR: 38.23 mL/min — ABNORMAL LOW (ref >60.00)
Glucose, Bld: 177 mg/dL — ABNORMAL HIGH (ref 70–99)
Potassium: 4.1 mEq/L (ref 3.5–5.1)
Sodium: 140 mEq/L (ref 135–145)
Total Bilirubin: 1.2 mg/dL (ref 0.2–1.2)
Total Protein: 7.1 g/dL (ref 6.0–8.3)

## 2019-08-27 LAB — VITAMIN D 25 HYDROXY (VIT D DEFICIENCY, FRACTURES): VITD: 22.83 ng/mL — ABNORMAL LOW (ref 30.00–100.00)

## 2019-08-27 LAB — LIPID PANEL
Cholesterol: 104 mg/dL (ref 0–200)
HDL: 44.9 mg/dL (ref >39.00)
LDL Cholesterol: 47 mg/dL (ref 0–99)
NonHDL: 59.28
Total CHOL/HDL Ratio: 2
Triglycerides: 59 mg/dL (ref 0.0–149.0)
VLDL: 11.8 mg/dL (ref 0.0–40.0)

## 2019-08-27 LAB — TSH: TSH: 1.89 u[IU]/mL (ref 0.35–4.50)

## 2019-08-27 MED ORDER — PRAVASTATIN SODIUM 40 MG PO TABS: 40.0000 mg | ORAL_TABLET | Freq: Every day | ORAL | 3 refills | Status: DC

## 2019-08-27 MED ORDER — OMEPRAZOLE 40 MG PO CPDR: 40.0000 mg | DELAYED_RELEASE_CAPSULE | Freq: Every day | ORAL | 3 refills | Status: DC

## 2019-08-27 NOTE — Progress Notes (Signed)
[name] is a 83 y.o. female with the following history as recorded in Epic[employer]:  Patient Active Problem List   Diagnosis Date Noted  . DOE (dyspnea on exertion) 01/30/2018  . Acute bronchiolitis 12/17/2017  . Wheezing 10/29/2017  . Vitamin D deficiency 07/31/2017  . Renal insufficiency 07/31/2017  . Knee pain 06/01/2017  . Morbid obesity due to excess calories ([place]) 01/25/2017  . Diabetes ([place]) 04/12/2016  . Cough 01/20/2015  . Atrial fibrillation ([place]) 05/28/2014  . Personal history of colonic polyps 05/28/2014  . Vomiting 01/29/2014  . CAD (coronary artery disease) 09/01/2013  . Routine general medical examination at a health care facility 07/17/2011  . Encounter for long-term (current) use of other medications 07/06/2011  . Nonspecific (abnormal) findings on radiological and other examination of body structure 11/09/2009  . IRRITABLE BOWEL SYNDROME 05/07/2009  . CHEST PAIN 05/07/2009  . ADNEXAL MASS, RIGHT 07/22/2008  . HEMORRHOIDS, RECURRENT 01/27/2008  . Diverticulitis 01/27/2008  . OTH ABNORMAL FIND RAD EXAMINATION BREAST 01/27/2008  . GERD 01/13/2008  . UTI 09/28/2007  . Dyslipidemia 05/27/2007  . ANEMIA-IRON DEFICIENCY 05/27/2007  . ANXIETY 05/27/2007  . Essential hypertension 05/27/2007  . Seasonal allergic rhinitis 05/27/2007  . Cough variant asthma 05/27/2007  . Osteoporosis 05/27/2007  . DIVERTICULITIS, HX OF 05/27/2007    Current Outpatient Medications  Medication Sig Dispense Refill  . montelukast (SINGULAIR) 10 MG tablet Take 10 mg by mouth at bedtime.    Marland Kitchen acetaminophen (TYLENOL) 325 MG tablet Take 650 mg by mouth every 6 (six) hours as needed for pain.    Marland Kitchen albuterol (PROAIR HFA) 108 (90 Base) MCG/ACT inhaler Inhale 2 puffs into the lungs every 4 (four) hours as needed for wheezing. 18 g 11  . amLODipine (NORVASC) 5 MG tablet TAKE 1 TABLET BY MOUTH TWICE A DAY 180 tablet 2  . Blood Glucose Monitoring Suppl (ONE TOUCH ULTRA 2) W/DEVICE KIT Check blood  sugar 3 times per day. 1 each 0  . budesonide-formoterol (SYMBICORT) 160-4.5 MCG/ACT inhaler Take 2 puffs first thing in am and then another 2 puffs about 12 hours later. 10.2 Inhaler 11  . ELIQUIS 2.5 MG TABS tablet TAKE 1 TABLET BY MOUTH TWICE A DAY 180 tablet 1  . insulin lispro (HUMALOG) 100 UNIT/ML injection 3 units with breakfast, and 22 units with supper 20 mL 6  . Insulin Syringe-Needle U-100 (BD INSULIN SYRINGE ULTRAFINE) 31G X 5/16" 0.3 ML MISC Use as directed     . losartan-hydrochlorothiazide (HYZAAR) 100-25 MG tablet TAKE 1 TABLET BY MOUTH EVERY DAY 90 tablet 3  . montelukast (SINGULAIR) 10 MG tablet One a at bedtime 90 tablet 1  . omeprazole (PRILOSEC) 40 MG capsule Take 1 capsule (40 mg total) by mouth daily. 30 capsule 3  . ONE TOUCH LANCETS MISC Use as directed three times daily     . potassium chloride (K-DUR) 10 MEQ tablet Take 1 tablet (10 mEq total) by mouth daily. 90 tablet 3  . pravastatin (PRAVACHOL) 40 MG tablet Take 1 tablet (40 mg total) by mouth daily. 90 tablet 3   No current facility-administered medications for this visit.     Allergies: Aspirin and Ramipril  Past Medical History:  Diagnosis Date  . Allergic rhinitis   . Anterior chest wall pain   . Anxiety   . Asthma   . Cough   . DM type 2 (diabetes mellitus, type 2) ([place])   . GERD (gastroesophageal reflux disease)   . History of diverticulitis of  colon   . HTN (hypertension)   . Hyperlipidemia   . IBS (irritable bowel syndrome)   . Iron deficiency anemia   . Osteoporosis, unspecified   . Renal insufficiency 07/31/2017  . UTI (urinary tract infection)     Past Surgical History:  Procedure Laterality Date  . CARDIOVASCULAR STRESS TEST  02/25/04  . ESOPHAGOGASTRODUODENOSCOPY  12/26/01  . RIGHT OOPHORECTOMY  jan 2010    Family History  Problem Relation Age of Onset  . Lung cancer Brother   . Melanoma Brother   . Hypertension Mother   . Stroke Mother   . Other Father        poor circulation  .  Atopy Neg Hx   . Asthma Neg Hx   . Breast cancer Neg Hx     Social History   Tobacco Use  . Smoking status: Former Smoker    Packs/day: 0.30    Years: 5.00    Pack years: 1.50    Types: Cigarettes    Quit date: 10/02/1986    Years since quitting: 32.9  . Smokeless tobacco: Never Used  Substance Use Topics  . Alcohol use: No    Subjective:  Presents for yearly follow-up; majority of healthcare needs are managed by her specialists including; 1) Endocrine for Type 2 Diabetes; 2) Cardiology- Dr. Harrington Challenger for HTN, A. Fib; 3) Pulmonology- asthma   Mammogram is scheduled for January 2021; due to see her eye doctor;   Objective:  Vitals:   08/27/19 1050 08/27/19 1051  BP: 126/74   Pulse: (!) 49 (!) 56  Temp: 98.5 F (36.9 C)   TempSrc: Oral   SpO2: 97%   Weight: 195 lb 12.8 oz (88.8 kg)   Height: '5\' 7"'  (1.702 m)     General: Well developed, well nourished, in no acute distress  Skin : Warm and dry.  Head: Normocephalic and atraumatic  Eyes: Sclera and conjunctiva clear; pupils round and reactive to light; extraocular movements intact  Ears: External normal; canals clear; tympanic membranes normal  Oropharynx: Pink, supple. No suspicious lesions  Neck: Supple without thyromegaly, adenopathy  Lungs: Respirations unlabored; clear to auscultation bilaterally without wheeze, rales, rhonchi  CVS exam: normal rate and regular rhythm.  Neurologic: Alert and oriented; speech intact; face symmetrical; moves all extremities well; CNII-XII intact without focal deficit  Assessment:  1. Essential hypertension   2. Paroxysmal atrial fibrillation (HCC) Chronic  3. Morbid obesity due to excess calories (HCC) Chronic  4. Dyslipidemia   5. Other fatigue   6. Vitamin D deficiency     Plan:  Labs updated; refills updated; keep planned follow up with specialists;  She will return for Tdap at later date; follow-up in 1 year, sooner prn.   This visit occurred during the SARS-CoV-2 public health  emergency.  Safety protocols were in place, including screening questions prior to the visit, additional usage of staff PPE, and extensive cleaning of exam room while observing appropriate contact time as indicated for disinfecting solutions.    No follow-ups on file.  Orders Placed This Encounter  Procedures  . CBC w/Diff    Standing Status:   Future    Number of Occurrences:   1    Standing Expiration Date:   08/26/2020  . Comp Met (CMET)    Standing Status:   Future    Number of Occurrences:   1    Standing Expiration Date:   08/26/2020  . Lipid panel    Standing Status:  Future    Number of Occurrences:   1    Standing Expiration Date:   08/26/2020  . TSH    Standing Status:   Future    Number of Occurrences:   1    Standing Expiration Date:   08/26/2020  . Vitamin D (25 hydroxy)    Standing Status:   Future    Number of Occurrences:   1    Standing Expiration Date:   08/26/2020    Requested Prescriptions   Signed Prescriptions Disp Refills  . pravastatin (PRAVACHOL) 40 MG tablet 90 tablet 3    Sig: Take 1 tablet (40 mg total) by mouth daily.  Marland Kitchen omeprazole (PRILOSEC) 40 MG capsule 30 capsule 3    Sig: Take 1 capsule (40 mg total) by mouth daily.

## 2019-09-01 ENCOUNTER — Other Ambulatory Visit: Payer: Self-pay | Admitting: Family

## 2019-09-01 DIAGNOSIS — D582 Other hemoglobinopathies: Secondary | ICD-10-CM

## 2019-09-01 MED ORDER — VITAMIN D (ERGOCALCIFEROL) 1.25 MG (50000 UNIT) PO CAPS: 50000.0000 [IU] | ORAL_CAPSULE | ORAL | 0 refills | Status: DC

## 2019-09-11 DIAGNOSIS — H04123 Dry eye syndrome of bilateral lacrimal glands: Secondary | ICD-10-CM | POA: Diagnosis not present

## 2019-09-11 DIAGNOSIS — Z794 Long term (current) use of insulin: Secondary | ICD-10-CM | POA: Diagnosis not present

## 2019-09-11 DIAGNOSIS — E119 Type 2 diabetes mellitus without complications: Secondary | ICD-10-CM | POA: Diagnosis not present

## 2019-09-11 DIAGNOSIS — H40013 Open angle with borderline findings, low risk, bilateral: Secondary | ICD-10-CM | POA: Diagnosis not present

## 2019-09-11 DIAGNOSIS — Z961 Presence of intraocular lens: Secondary | ICD-10-CM | POA: Diagnosis not present

## 2019-09-11 LAB — HM DIABETES EYE EXAM

## 2019-09-11 NOTE — Progress Notes (Signed)
Cardiology Office Note   Date:  09/12/2019   ID:  [name], DOB 06/15/1936, MRN [id number]  PCP:  [name], FNP  Cardiologist:   Dorris Carnes, MD   F/U of PAF and HTN     History of Present Illness: [name] is a 83 y.o. female with a history of HTN, DM, coronary artery calcifications and hot flashes  SHe also had an epsode in ED of atrial fib  CHADSVASc of 5      Myovue with evidence of ischemia  Moderate anteriorly I recomm CT scan  This showed Ca score of 51  Modrate CAD of LAD   FFR done  Did not appear flow limiting     Small nodule seen on CT on R middle lobe    She was last in cardiology clinic in Dec 2019   She had a televsit in May 2020  Since seen the patient has done well.  She denies dizziness.  No palpitations.  No shortness of breath.  She is not walking regularly.  Current Meds  Medication Sig  . acetaminophen (TYLENOL) 325 MG tablet Take 650 mg by mouth every 6 (six) hours as needed for pain.  Marland Kitchen albuterol (PROAIR HFA) 108 (90 Base) MCG/ACT inhaler Inhale 2 puffs into the lungs every 4 (four) hours as needed for wheezing.  Marland Kitchen amLODipine (NORVASC) 5 MG tablet TAKE 1 TABLET BY MOUTH TWICE A DAY  . Blood Glucose Monitoring Suppl (ONE TOUCH ULTRA 2) W/DEVICE KIT Check blood sugar 3 times per day.  . budesonide-formoterol (SYMBICORT) 160-4.5 MCG/ACT inhaler Take 2 puffs first thing in am and then another 2 puffs about 12 hours later.  Marland Kitchen ELIQUIS 2.5 MG TABS tablet TAKE 1 TABLET BY MOUTH TWICE A DAY  . insulin lispro (HUMALOG) 100 UNIT/ML injection 3 units with breakfast, and 22 units with supper  . Insulin Syringe-Needle U-100 (BD INSULIN SYRINGE ULTRAFINE) 31G X 5/16" 0.3 ML MISC Use as directed   . losartan-hydrochlorothiazide (HYZAAR) 100-25 MG tablet TAKE 1 TABLET BY MOUTH EVERY DAY  . montelukast (SINGULAIR) 10 MG tablet One a at bedtime  . montelukast (SINGULAIR) 10 MG tablet Take 10 mg by mouth at bedtime.  Marland Kitchen omeprazole (PRILOSEC) 40 MG capsule  Take 1 capsule (40 mg total) by mouth daily.  . ONE TOUCH LANCETS MISC Use as directed three times daily   . potassium chloride (K-DUR) 10 MEQ tablet Take 1 tablet (10 mEq total) by mouth daily.  . pravastatin (PRAVACHOL) 40 MG tablet Take 1 tablet (40 mg total) by mouth daily.  . Vitamin D, Ergocalciferol, (DRISDOL) 1.25 MG (50000 UT) CAPS capsule Take 1 capsule (50,000 Units total) by mouth every 7 (seven) days for 12 doses.        Allergies:   Aspirin and Ramipril   Past Medical History:  Diagnosis Date  . Allergic rhinitis   . Anterior chest wall pain   . Anxiety   . Asthma   . Cough   . DM type 2 (diabetes mellitus, type 2) ([place])   . GERD (gastroesophageal reflux disease)   . History of diverticulitis of colon   . HTN (hypertension)   . Hyperlipidemia   . IBS (irritable bowel syndrome)   . Iron deficiency anemia   . Osteoporosis, unspecified   . Renal insufficiency 07/31/2017  . UTI (urinary tract infection)     Past Surgical History:  Procedure Laterality Date  . CARDIOVASCULAR STRESS TEST  02/25/04  . ESOPHAGOGASTRODUODENOSCOPY  12/26/01  . RIGHT OOPHORECTOMY  jan 2010     Social History:  The patient  reports that she quit smoking about 32 years ago. Her smoking use included cigarettes. She has a 1.50 pack-year smoking history. She has never used smokeless tobacco. She reports that she does not drink alcohol or use drugs.   Family History:  The patient's family history includes Hypertension in her mother; Lung cancer in her brother; Melanoma in her brother; Other in her father; Stroke in her mother.    ROS:  Please see the history of present illness. All other systems are reviewed and  Negative to the above problem except as noted.    PHYSICAL EXAM: VS:  BP (!) 152/80   Pulse 61   Ht _0  (1.702 m)   Wt 197 lb (89.4 kg)   BMI 30.85 kg/m   GEN: Obese 83 yo  in no acute distress  HEENT: normal  Neck: JVP normal   Cardiac: Irregularly irregular; I-II/VI  systolic murmur at LSB   No rubs, or gallops,no edema  Respiratory:  clear to auscultation bilaterally, normal work of breathing GI: soft, nontender, nondistended, + BS  No hepatomegaly  MS: no deformity Moving all extremities   Skin: warm and dry, no rash Neuro:  Strength and sensation are intact Psych: euthymic mood, full affect   EKG:  EKG is  ordered today.  Atrial fibrillation.  61 bpm  Lipid Panel    Component Value Date/Time   CHOL 104 08/27/2019 1120   TRIG 59.0 08/27/2019 1120   TRIG 83 08/07/2006 1419   HDL 44.90 08/27/2019 1120   CHOLHDL 2 08/27/2019 1120   VLDL 11.8 08/27/2019 1120   LDLCALC 47 08/27/2019 1120      Wt Readings from Last 3 Encounters:  09/12/19 197 lb (89.4 kg)  08/27/19 195 lb 12.8 oz (88.8 kg)  06/12/19 196 lb (88.9 kg)      ASSESSMENT AND PLAN:  1  Atrial fib   patient in atrial fibrillation at present.  Continue Eliquis.  She is on 2.5 twice daily.  2  HTN BP is high today but it has been good on previous visits I would keep on same regimen.  Follow. 3  HL  Cont pravastatin last lipid panel was excellent continue  2.  CAD   No CP or dyspnea no symptoms of angina would follow.  5  Abnormal chest CT   F/U CT done in December 2019.  Nodule unchanged.  Felt likely vascular.  Disposition:   FU with  in early August.  Signed, Dorris Carnes, MD  09/12/2019 8:42 AM    [address], [place], [place]  [address] Phone: [phone]; Fax: [phone]

## 2019-09-12 ENCOUNTER — Ambulatory Visit: Payer: Medicare Other | Admitting: Internal Medicine

## 2019-09-12 ENCOUNTER — Other Ambulatory Visit: Payer: Self-pay

## 2019-09-12 ENCOUNTER — Encounter: Payer: Self-pay | Admitting: Internal Medicine

## 2019-09-12 VITALS — BP 152/80 | HR 61 | Ht 67.0 in | Wt 197.0 lb

## 2019-09-12 DIAGNOSIS — I2510 Atherosclerotic heart disease of native coronary artery without angina pectoris: Secondary | ICD-10-CM

## 2019-09-12 NOTE — Patient Instructions (Signed)
Medication Instructions:  No changes *If you need a refill on your cardiac medications before your next appointment, please call your pharmacy*  Lab Work: none If you have labs (blood work) drawn today and your tests are completely normal, you will receive your results only by: Marland Kitchen MyChart Message (if you have MyChart) OR . A paper copy in the mail If you have any lab test that is abnormal or we need to change your treatment, we will call you to review the results.  Testing/Procedures: none  Follow-Up: At [facility], you and your health needs are our priority.  As part of our continuing mission to provide you with exceptional heart care, we have created designated Provider Care Teams.  These Care Teams include your primary Cardiologist (physician) and Advanced Practice Providers (APPs -  Physician Assistants and Nurse Practitioners) who all work together to provide you with the care you need, when you need it.  Your next appointment:   8 month(s)  The format for your next appointment:   In Person  Provider:   You may see Dorris Carnes, MD or one of the following Advanced Practice Providers on your designated Care Team:    Richardson Dopp, PA-C  [place], [place]  Daune Perch, NP   Other Instructions

## 2019-10-03 DIAGNOSIS — Z7951 Long term (current) use of inhaled steroids: Secondary | ICD-10-CM | POA: Insufficient documentation

## 2019-10-03 DIAGNOSIS — Z794 Long term (current) use of insulin: Secondary | ICD-10-CM | POA: Insufficient documentation

## 2019-10-03 DIAGNOSIS — N183 Chronic kidney disease, stage 3 unspecified: Secondary | ICD-10-CM | POA: Insufficient documentation

## 2019-10-03 DIAGNOSIS — M810 Age-related osteoporosis without current pathological fracture: Secondary | ICD-10-CM | POA: Insufficient documentation

## 2019-10-03 DIAGNOSIS — E785 Hyperlipidemia, unspecified: Secondary | ICD-10-CM | POA: Insufficient documentation

## 2019-10-14 ENCOUNTER — Ambulatory Visit: Payer: Medicare Other

## 2019-10-29 ENCOUNTER — Other Ambulatory Visit: Payer: Self-pay | Admitting: Internal Medicine

## 2019-10-29 NOTE — Telephone Encounter (Signed)
Eliquis 2.5mg  refill request received, pt is 84yrs old, weight-89.4kg, Crea-1.57 on 08/27/2019, Diagnosis-Afib, and last seen by Dr. Harrington Challenger on 09/12/2019. Dose is appropriate based on dosing criteria. Will send in refill to requested pharmacy.

## 2019-11-19 ENCOUNTER — Telehealth: Payer: Self-pay | Admitting: *Deleted

## 2019-11-19 NOTE — Telephone Encounter (Signed)
   [facility] Pre-operative Risk Assessment    Request for surgical clearance:  1. What type of surgery is being performed? COLONOSCOPY   2. When is this surgery scheduled? 12/18/19   3. What type of clearance is required (medical clearance vs. Pharmacy clearance to hold med vs. Both)? BOTH  4. Are there any medications that need to be held prior to surgery and how long? ELIQUIS   5. Practice name and name of physician performing surgery? [facility]; DR. JEFFREY MEDOFF   6. What is your office phone number [phone]    7.   What is your office fax number [phone]  8.   Anesthesia type (None, local, MAC, general) ? NOT LISTED   [name] 11/19/2019, 10:17 AM  _________________________________________________________________   (provider comments below)

## 2019-11-19 NOTE — Telephone Encounter (Signed)
Pt takes Eliquis for afib with CHADS2VASc score of 6 (age x2, sex, HTN, DM, CAD). SCr 1.57, CrCl 4mL/min, pt on reduced dose Eliquis due to age and renal function. Ok to hold Eliquis for 1-2 days prior to procedure.

## 2019-11-19 NOTE — Telephone Encounter (Signed)
Please comment on eliquis. 

## 2019-11-20 NOTE — Telephone Encounter (Signed)
Left VM

## 2019-11-21 ENCOUNTER — Ambulatory Visit: Payer: Medicare Other

## 2019-11-21 ENCOUNTER — Other Ambulatory Visit: Payer: Self-pay | Admitting: Family

## 2019-11-21 NOTE — Telephone Encounter (Signed)
   Primary Cardiologist: Dorris Carnes, MD  Attempted 2nd call today. No voicemail options at the time of my call.    Abigail Butts, PA-C 11/21/2019, 3:08 PM

## 2019-11-24 NOTE — Telephone Encounter (Signed)
LMTCB

## 2019-11-25 NOTE — Telephone Encounter (Signed)
   Primary Cardiologist: Dorris Carnes, MD  Chart reviewed as part of pre-operative protocol coverage. Given past medical history and time since last visit, based on ACC/AHA guidelines, [name] would be at acceptable risk for the planned procedure without further cardiovascular testing.   See below but ok to hold Eliquis for 1-2 days.    I will route this recommendation to the requesting party via Epic fax function and remove from pre-op pool.  Please call with questions.  Cecilie Kicks, NP 11/25/2019, 9:30 AM

## 2019-11-27 ENCOUNTER — Other Ambulatory Visit: Payer: Self-pay | Admitting: Family

## 2019-12-10 DIAGNOSIS — K5790 Diverticulosis of intestine, part unspecified, without perforation or abscess without bleeding: Secondary | ICD-10-CM | POA: Diagnosis not present

## 2019-12-10 DIAGNOSIS — Z86010 Personal history of colonic polyps: Secondary | ICD-10-CM | POA: Diagnosis not present

## 2019-12-10 DIAGNOSIS — H40013 Open angle with borderline findings, low risk, bilateral: Secondary | ICD-10-CM | POA: Diagnosis not present

## 2019-12-10 DIAGNOSIS — K219 Gastro-esophageal reflux disease without esophagitis: Secondary | ICD-10-CM | POA: Diagnosis not present

## 2019-12-13 ENCOUNTER — Other Ambulatory Visit: Payer: Self-pay | Admitting: Internal Medicine

## 2019-12-16 ENCOUNTER — Other Ambulatory Visit: Payer: Self-pay | Admitting: Internal Medicine

## 2019-12-18 DIAGNOSIS — K623 Rectal prolapse: Secondary | ICD-10-CM | POA: Diagnosis not present

## 2019-12-18 DIAGNOSIS — Z86010 Personal history of colonic polyps: Secondary | ICD-10-CM | POA: Diagnosis not present

## 2019-12-18 DIAGNOSIS — Z1211 Encounter for screening for malignant neoplasm of colon: Secondary | ICD-10-CM | POA: Diagnosis not present

## 2019-12-19 ENCOUNTER — Other Ambulatory Visit: Payer: Self-pay

## 2019-12-19 ENCOUNTER — Encounter: Payer: Self-pay | Admitting: Emergency Medicine

## 2019-12-19 ENCOUNTER — Emergency Department (HOSPITAL_COMMUNITY): Payer: Medicare Other

## 2019-12-19 ENCOUNTER — Encounter (HOSPITAL_COMMUNITY): Payer: Self-pay | Admitting: Emergency Medicine

## 2019-12-19 ENCOUNTER — Inpatient Hospital Stay (HOSPITAL_COMMUNITY)
Admission: EM | Admit: 2019-12-19 | Discharge: 2019-12-22 | DRG: 871 | Disposition: A | Discharge status: Home / Self Care | Payer: Medicare Other | Attending: Family Medicine | Admitting: Family Medicine

## 2019-12-19 ENCOUNTER — Ambulatory Visit
Admission: EM | Admit: 2019-12-19 | Discharge: 2019-12-19 | Disposition: A | Discharge status: Home / Self Care | Payer: Medicare Other | Source: Home / Self Care

## 2019-12-19 DIAGNOSIS — A419 Sepsis, unspecified organism: Principal | ICD-10-CM | POA: Diagnosis present

## 2019-12-19 DIAGNOSIS — I129 Hypertensive chronic kidney disease with stage 1 through stage 4 chronic kidney disease, or unspecified chronic kidney disease: Secondary | ICD-10-CM | POA: Diagnosis present

## 2019-12-19 DIAGNOSIS — Z823 Family history of stroke: Secondary | ICD-10-CM

## 2019-12-19 DIAGNOSIS — M810 Age-related osteoporosis without current pathological fracture: Secondary | ICD-10-CM | POA: Diagnosis not present

## 2019-12-19 DIAGNOSIS — N179 Acute kidney failure, unspecified: Secondary | ICD-10-CM | POA: Diagnosis not present

## 2019-12-19 DIAGNOSIS — Z808 Family history of malignant neoplasm of other organs or systems: Secondary | ICD-10-CM

## 2019-12-19 DIAGNOSIS — E1110 Type 2 diabetes mellitus with ketoacidosis without coma: Secondary | ICD-10-CM | POA: Diagnosis not present

## 2019-12-19 DIAGNOSIS — J45909 Unspecified asthma, uncomplicated: Secondary | ICD-10-CM | POA: Diagnosis not present

## 2019-12-19 DIAGNOSIS — N1832 Chronic kidney disease, stage 3b: Secondary | ICD-10-CM | POA: Diagnosis not present

## 2019-12-19 DIAGNOSIS — I4510 Unspecified right bundle-branch block: Secondary | ICD-10-CM | POA: Diagnosis not present

## 2019-12-19 DIAGNOSIS — Z8249 Family history of ischemic heart disease and other diseases of the circulatory system: Secondary | ICD-10-CM | POA: Diagnosis not present

## 2019-12-19 DIAGNOSIS — Z888 Allergy status to other drugs, medicaments and biological substances status: Secondary | ICD-10-CM

## 2019-12-19 DIAGNOSIS — R739 Hyperglycemia, unspecified: Secondary | ICD-10-CM

## 2019-12-19 DIAGNOSIS — K219 Gastro-esophageal reflux disease without esophagitis: Secondary | ICD-10-CM | POA: Diagnosis not present

## 2019-12-19 DIAGNOSIS — Z20822 Contact with and (suspected) exposure to covid-19: Secondary | ICD-10-CM | POA: Diagnosis not present

## 2019-12-19 DIAGNOSIS — Z87891 Personal history of nicotine dependence: Secondary | ICD-10-CM | POA: Diagnosis not present

## 2019-12-19 DIAGNOSIS — R1030 Lower abdominal pain, unspecified: Secondary | ICD-10-CM | POA: Diagnosis not present

## 2019-12-19 DIAGNOSIS — Z7901 Long term (current) use of anticoagulants: Secondary | ICD-10-CM | POA: Diagnosis not present

## 2019-12-19 DIAGNOSIS — G8918 Other acute postprocedural pain: Secondary | ICD-10-CM

## 2019-12-19 DIAGNOSIS — R509 Fever, unspecified: Secondary | ICD-10-CM | POA: Diagnosis not present

## 2019-12-19 DIAGNOSIS — Z801 Family history of malignant neoplasm of trachea, bronchus and lung: Secondary | ICD-10-CM | POA: Diagnosis not present

## 2019-12-19 DIAGNOSIS — E1122 Type 2 diabetes mellitus with diabetic chronic kidney disease: Secondary | ICD-10-CM | POA: Diagnosis present

## 2019-12-19 DIAGNOSIS — I4891 Unspecified atrial fibrillation: Secondary | ICD-10-CM | POA: Diagnosis not present

## 2019-12-19 DIAGNOSIS — I2510 Atherosclerotic heart disease of native coronary artery without angina pectoris: Secondary | ICD-10-CM | POA: Diagnosis present

## 2019-12-19 DIAGNOSIS — Z886 Allergy status to analgesic agent status: Secondary | ICD-10-CM

## 2019-12-19 DIAGNOSIS — E785 Hyperlipidemia, unspecified: Secondary | ICD-10-CM | POA: Diagnosis present

## 2019-12-19 DIAGNOSIS — G8929 Other chronic pain: Secondary | ICD-10-CM | POA: Diagnosis present

## 2019-12-19 DIAGNOSIS — Z794 Long term (current) use of insulin: Secondary | ICD-10-CM

## 2019-12-19 DIAGNOSIS — R1032 Left lower quadrant pain: Secondary | ICD-10-CM

## 2019-12-19 DIAGNOSIS — Z7951 Long term (current) use of inhaled steroids: Secondary | ICD-10-CM | POA: Diagnosis not present

## 2019-12-19 DIAGNOSIS — E860 Dehydration: Secondary | ICD-10-CM | POA: Diagnosis present

## 2019-12-19 LAB — URINALYSIS, ROUTINE W REFLEX MICROSCOPIC
Glucose, UA: NEGATIVE mg/dL
Hgb urine dipstick: NEGATIVE
Ketones, ur: 15 mg/dL — AB
Nitrite: NEGATIVE
Protein, ur: 30 mg/dL — AB
Specific Gravity, Urine: 1.025 (ref 1.005–1.030)
pH: 5 (ref 5.0–8.0)

## 2019-12-19 LAB — POCT I-STAT EG7
Acid-base deficit: 9 mmol/L — ABNORMAL HIGH (ref 0.0–2.0)
Bicarbonate: 16.8 mmol/L — ABNORMAL LOW (ref 20.0–28.0)
Calcium, Ion: 1.17 mmol/L (ref 1.15–1.40)
HCT: 33 % — ABNORMAL LOW (ref 36.0–46.0)
Hemoglobin: 11.2 g/dL — ABNORMAL LOW (ref 12.0–15.0)
O2 Saturation: 99 %
Potassium: 4 mmol/L (ref 3.5–5.1)
Sodium: 142 mmol/L (ref 135–145)
TCO2: 18 mmol/L — ABNORMAL LOW (ref 22–32)
pCO2, Ven: 36.2 mmHg — ABNORMAL LOW (ref 44.0–60.0)
pH, Ven: 7.275 (ref 7.250–7.430)
pO2, Ven: 160 mmHg — ABNORMAL HIGH (ref 32.0–45.0)

## 2019-12-19 LAB — COMPREHENSIVE METABOLIC PANEL
ALT: 27 U/L (ref 0–44)
AST: 34 U/L (ref 15–41)
Albumin: 3.5 g/dL (ref 3.5–5.0)
Alkaline Phosphatase: 60 U/L (ref 38–126)
Anion gap: 15 (ref 5–15)
BUN: 22 mg/dL (ref 8–23)
CO2: 15 mmol/L — ABNORMAL LOW (ref 22–32)
Calcium: 8.9 mg/dL (ref 8.9–10.3)
Chloride: 109 mmol/L (ref 98–111)
Creatinine, Ser: 2.35 mg/dL — ABNORMAL HIGH (ref 0.44–1.00)
GFR calc Af Amer: 22 mL/min — ABNORMAL LOW (ref >60)
GFR calc non Af Amer: 19 mL/min — ABNORMAL LOW (ref >60)
Glucose, Bld: 234 mg/dL — ABNORMAL HIGH (ref 70–99)
Potassium: 4.2 mmol/L (ref 3.5–5.1)
Sodium: 139 mmol/L (ref 135–145)
Total Bilirubin: 1.6 mg/dL — ABNORMAL HIGH (ref 0.3–1.2)
Total Protein: 6.8 g/dL (ref 6.5–8.1)

## 2019-12-19 LAB — CBC
HCT: 33.4 % — ABNORMAL LOW (ref 36.0–46.0)
Hemoglobin: 11.1 g/dL — ABNORMAL LOW (ref 12.0–15.0)
MCH: 27.4 pg (ref 26.0–34.0)
MCHC: 33.2 g/dL (ref 30.0–36.0)
MCV: 82.5 fL (ref 80.0–100.0)
Platelets: 132 10*3/uL — ABNORMAL LOW (ref 150–400)
RBC: 4.05 MIL/uL (ref 3.87–5.11)
RDW: 16.3 % — ABNORMAL HIGH (ref 11.5–15.5)
WBC: 22.7 10*3/uL — ABNORMAL HIGH (ref 4.0–10.5)
nRBC: 0 % (ref 0.0–0.2)

## 2019-12-19 LAB — URINALYSIS, MICROSCOPIC (REFLEX)

## 2019-12-19 LAB — LIPASE, BLOOD: Lipase: 26 U/L (ref 11–51)

## 2019-12-19 MED ORDER — ONDANSETRON HCL 4 MG/2ML IJ SOLN
4.0000 mg | Freq: Once | INTRAMUSCULAR | Status: AC
  Administered 2019-12-19: 20:00:00 4 mg via INTRAVENOUS
  Filled 2019-12-19: qty 2

## 2019-12-19 MED ORDER — ACETAMINOPHEN 325 MG PO TABS
650.0000 mg | ORAL_TABLET | Freq: Once | ORAL | Status: AC
  Administered 2019-12-19: 23:00:00 650 mg via ORAL
  Filled 2019-12-19: qty 2

## 2019-12-19 MED ORDER — SODIUM CHLORIDE 0.9 % IV BOLUS
1000.0000 mL | Freq: Once | INTRAVENOUS | Status: AC
  Administered 2019-12-19: 19:00:00 1000 mL via INTRAVENOUS

## 2019-12-19 MED ORDER — SODIUM CHLORIDE 0.9 % IV BOLUS
500.0000 mL | Freq: Once | INTRAVENOUS | Status: AC
  Administered 2019-12-19: 21:00:00 500 mL via INTRAVENOUS

## 2019-12-19 NOTE — H&P (Addendum)
History and Physical    AMERE IOTT VEL:[id number] DOB: 02/21/1936 DOA: 12/19/2019  PCP: [name], [place]  Patient coming from:  home  I have personally briefly reviewed patient's old medical records in [place]  Chief Complaint: llq abdominal pain  S/p c-scope   HPI: [name] is a 84 y.o. female with medical history significant of  asthma, DM, HTN, HLD, afib on eliquis, CAD comes in for LLQ pain s/p cscope on 3/18. Per patient and daughter colonoscopy per GI MD was noted to be normal and there we no complications during procedure and no biopsies performed. Per patient she states that LLQ pain was relieved by bowel movement. She notes bowel movement were yellow-green in color no blood noted. She states although pain resolved with bowel movement she continued to feel unwell with persistent nausea. Per daughter she has had poor appetite since then. Patient notes that also associated with episode were chills and sweats.  ON further ROS she notes no sob/ chest pain / and although she has had nausea has not had emesis. She denies any dysuria, cough or recurrence of chills or sweat or any fever. She states currently she feels improved and her nausea is now resolved and only notes slight tenderness in her LLQ with palpation but otherwise is quite comfortable. She denies any recent sick contacts, and notes chronic DOE due to her asthma but no change from baseline. IN reference to her DMII she notes that it is usually well controlled at home.    ED Course: Vitals BP164/62,Temp 99.6 F (37.6 C),rectal 100.2 HR73, RR18  Labs: Wbc 22.7, hgb11.1 at baseline, plt 132 prior 161 Cr:2.35 up from 1.57.,bicarb 15, glucose 234, UA:+ ketones,few bacteria no wbc Abg: ph 7.275/co36/ sat 99% on ra  Review of Systems: As per HPI otherwise 10 point review of systems negative.   Past Medical History:  Diagnosis Date  . Allergic rhinitis   . Anterior chest wall pain   . Anxiety   . Asthma    . Cough   . DM type 2 (diabetes mellitus, type 2) ([place])   . GERD (gastroesophageal reflux disease)   . History of diverticulitis of colon   . HTN (hypertension)   . Hyperlipidemia   . IBS (irritable bowel syndrome)   . Iron deficiency anemia   . Osteoporosis, unspecified   . Renal insufficiency 07/31/2017  . UTI (urinary tract infection)     Past Surgical History:  Procedure Laterality Date  . CARDIOVASCULAR STRESS TEST  02/25/04  . ESOPHAGOGASTRODUODENOSCOPY  12/26/01  . RIGHT OOPHORECTOMY  jan 2010     reports that she quit smoking about 33 years ago. Her smoking use included cigarettes. She has a 1.50 pack-year smoking history. She has never used smokeless tobacco. She reports that she does not drink alcohol or use drugs.  Allergies  Allergen Reactions  . Aspirin Shortness Of Breath and Other (See Comments)    Caused asthma symptoms  . Ramipril Cough    Family History  Problem Relation Age of Onset  . Lung cancer Brother   . Melanoma Brother   . Hypertension Mother   . Stroke Mother   . Other Father        poor circulation  . Atopy Neg Hx   . Asthma Neg Hx   . Breast cancer Neg Hx     Prior to Admission medications   Medication Sig Start Date End Date Taking? Authorizing Provider  acetaminophen (TYLENOL) 325  MG tablet Take 650 mg by mouth every 6 (six) hours as needed for pain.   Yes [provider]  albuterol (PROAIR HFA) 108 (90 Base) MCG/ACT inhaler Inhale 2 puffs into the lungs every 4 (four) hours as needed for wheezing. 05/05/19 05/04/20 Yes Tanda Rockers, MD  amLODipine (NORVASC) 5 MG tablet TAKE 1 TABLET BY MOUTH TWICE A DAY Patient taking differently: Take 5 mg by mouth in the morning and at bedtime.  04/14/19  Yes Fay Records, MD  ascorbic acid (VITAMIN C) 500 MG tablet Take 500-1,000 mg by mouth daily.   Yes [provider]  budesonide-formoterol (SYMBICORT) 160-4.5 MCG/ACT inhaler Take 2 puffs first thing in am and then another 2 puffs  about 12 hours later. Patient taking differently: Inhale 2 puffs into the lungs every 12 (twelve) hours.  05/05/19  Yes Tanda Rockers, MD  ELIQUIS 2.5 MG TABS tablet TAKE 1 TABLET BY MOUTH TWICE A DAY Patient taking differently: Take 2.5 mg by mouth 2 (two) times daily.  10/29/19  Yes Fay Records, MD  insulin lispro (HUMALOG) 100 UNIT/ML injection 3 units with breakfast, and 22 units with supper Patient taking differently: Inject 5-25 Units into the skin See admin instructions. Inject 5 units into the skin in the morning before breakfast and 25 units before supper 06/12/19  Yes Renato Shin, MD  losartan-hydrochlorothiazide (HYZAAR) 100-25 MG tablet TAKE 1 TABLET BY MOUTH EVERY DAY Patient taking differently: Take 1 tablet by mouth daily.  04/23/19  Yes Fay Records, MD  montelukast (SINGULAIR) 10 MG tablet TAKE 1 TABLET BY MOUTH AT BEDTIME Patient taking differently: Take 10 mg by mouth at bedtime.  12/15/19  Yes Tanda Rockers, MD  omeprazole (PRILOSEC) 40 MG capsule TAKE 1 CAPSULE BY MOUTH EVERY DAY Patient taking differently: Take 40 mg by mouth daily before breakfast.  11/21/19  Yes [name], FNP  potassium chloride (KLOR-CON) 10 MEQ tablet TAKE 1 TABLET BY MOUTH EVERY DAY Patient taking differently: Take 10 mEq by mouth daily.  12/16/19  Yes Fay Records, MD  pravastatin (PRAVACHOL) 40 MG tablet Take 1 tablet (40 mg total) by mouth daily. 08/27/19  Yes [name], FNP  Blood Glucose Monitoring Suppl (ONE TOUCH ULTRA 2) W/DEVICE KIT Check blood sugar 3 times per day. 07/24/14   Renato Shin, MD  Insulin Syringe-Needle U-100 (BD INSULIN SYRINGE ULTRAFINE) 31G X 5/16" 0.3 ML MISC Use as directed     [provider]  ONE TOUCH LANCETS MISC Use as directed three times daily     [provider]  Vitamin D, Ergocalciferol, (DRISDOL) 1.25 MG (50000 UNIT) CAPS capsule TAKE 1 CAPSULE (50,000 UNITS TOTAL) BY MOUTH EVERY 7 (SEVEN) DAYS FOR 12 DOSES. Patient  not taking: Reported on 12/19/2019 11/28/19 02/14/20  [name], FNP    Physical Exam: Vitals:   12/19/19 1817 12/19/19 1930 12/19/19 2128 12/19/19 2131  BP: (!) 151/57 (!) 150/56  (!) 135/48  Pulse: (!) 52 67  61  Resp: _0 Temp:   100.2 F (37.9 C)   TempSrc:   Rectal   SpO2: 98% 95%  97%  Weight:      Height:        Constitutional: NAD, calm, comfortable Vitals:   12/19/19 1817 12/19/19 1930 12/19/19 2128 12/19/19 2131  BP: (!) 151/57 (!) 150/56  (!) 135/48  Pulse: (!) 52 67  61  Resp: _1 Temp:  100.2 F (37.9 C)   TempSrc:   Rectal   SpO2: 98% 95%  97%  Weight:      Height:       Eyes: PERRL, lids and conjunctivae normal ENMT: Mucous membranes are moist. Posterior pharynx clear of any exudate or lesions.Normal dentition.  Neck: normal, supple, no masses, no thyromegaly Respiratory: clear to auscultation bilaterally, no wheezing, no crackles. Normal respiratory effort. No accessory muscle use.  Cardiovascular: Regular rate and rhythm, no murmurs / rubs / gallops. No extremity edema. 2+ pedal pulses. No carotid bruits.  Abdomen: no tenderness, no masses palpated. No hepatosplenomegaly. Bowel sounds positive.  Musculoskeletal: no clubbing / cyanosis. No joint deformity upper and lower extremities. Good ROM, no contractures. Normal muscle tone.  Skin: no rashes, lesions, ulcers. No induration Neurologic: CN 2-12 grossly intact. Sensation intact, DTR normal. Strength 5/5 in all 4.  Psychiatric: Normal judgment and insight. Alert and oriented x 3. Normal mood.    Labs on Admission: I have personally reviewed following labs and imaging studies  CBC: Recent Labs  Lab 12/19/19 1413 12/19/19 2149  WBC 22.7*  --   HGB 11.1* 11.2*  HCT 33.4* 33.0*  MCV 82.5  --   PLT 132*  --    Basic Metabolic Panel: Recent Labs  Lab 12/19/19 1413 12/19/19 2149  NA 139 142  K 4.2 4.0  CL 109  --   CO2 15*  --   GLUCOSE 234*  --   BUN 22  --    CREATININE 2.35*  --   CALCIUM 8.9  --    GFR: Estimated Creatinine Clearance: 21.5 mL/min (A) (by C-G formula based on SCr of 2.35 mg/dL (H)). Liver Function Tests: Recent Labs  Lab 12/19/19 1413  AST 34  ALT 27  ALKPHOS 60  BILITOT 1.6*  PROT 6.8  ALBUMIN 3.5   Recent Labs  Lab 12/19/19 1413  LIPASE 26   No results for input(s): AMMONIA in the last 168 hours. Coagulation Profile: No results for input(s): INR, PROTIME in the last 168 hours. Cardiac Enzymes: No results for input(s): CKTOTAL, CKMB, CKMBINDEX, TROPONINI in the last 168 hours. BNP (last 3 results) No results for input(s): PROBNP in the last 8760 hours. HbA1C: No results for input(s): HGBA1C in the last 72 hours. CBG: No results for input(s): GLUCAP in the last 168 hours. Lipid Profile: No results for input(s): CHOL, HDL, LDLCALC, TRIG, CHOLHDL, LDLDIRECT in the last 72 hours. Thyroid Function Tests: No results for input(s): TSH, T4TOTAL, FREET4, T3FREE, THYROIDAB in the last 72 hours. Anemia Panel: No results for input(s): VITAMINB12, FOLATE, FERRITIN, TIBC, IRON, RETICCTPCT in the last 72 hours. Urine analysis:    Component Value Date/Time   COLORURINE YELLOW 12/19/2019 1800   APPEARANCEUR CLEAR 12/19/2019 1800   LABSPEC 1.025 12/19/2019 1800   PHURINE 5.0 12/19/2019 1800   GLUCOSEU NEGATIVE 12/19/2019 1800   GLUCOSEU NEGATIVE 06/01/2017 0908   HGBUR NEGATIVE 12/19/2019 1800   HGBUR negative 09/28/2007 0933   BILIRUBINUR SMALL (A) 12/19/2019 1800   KETONESUR 15 (A) 12/19/2019 1800   PROTEINUR 30 (A) 12/19/2019 1800   UROBILINOGEN 0.2 06/01/2017 0908   NITRITE NEGATIVE 12/19/2019 1800   LEUKOCYTESUR TRACE (A) 12/19/2019 1800    Radiological Exams on Admission: CT ABDOMEN PELVIS WO CONTRAST  Result Date: 12/19/2019 CLINICAL DATA:  Left lower quadrant pain EXAM: CT ABDOMEN AND PELVIS WITHOUT CONTRAST TECHNIQUE: Multidetector CT imaging of the abdomen and pelvis was performed following the  standard protocol without IV  contrast. COMPARISON:  2014 FINDINGS: Lower chest: Cardiomegaly.  Mild bibasilar atelectasis. Hepatobiliary: Cholelithiasis. Too small to characterize hypoattenuating liver lesions are unchanged. Pancreas: Foci of parenchymal calcification likely reflecting sequelae of prior inflammation. Spleen: Unremarkable. Adrenals/Urinary Tract: Adrenals are unremarkable. Kidneys are unremarkable. Bladder is poorly distended. Stomach/Bowel: Small hiatal hernia. Persistent esophageal wall thickening. Bowel is normal in caliber. Normal appendix. Vascular/Lymphatic: Aortic atherosclerosis. No enlarged abdominal or pelvic lymph nodes. Reproductive: Status post hysterectomy. No adnexal masses. Other: No abdominal wall hernia or abnormality. No abdominopelvic ascites. Musculoskeletal: Advanced degenerative changes at the hips. Multilevel degenerative changes the visualized spine. IMPRESSION: No findings to account for reported symptoms. Stable chronic findings detailed above. Electronically Signed   By: Macy Mis M.D.   On: 12/19/2019 20:19   DG Chest Portable 1 View  Result Date: 12/19/2019 CLINICAL DATA:  Fever EXAM: PORTABLE CHEST 1 VIEW COMPARISON:  05/17/2018 FINDINGS: Cardiomegaly, vascular congestion. Bibasilar atelectasis. Interstitial prominence could reflect early interstitial edema. No effusions or acute bony abnormality. IMPRESSION: Cardiomegaly with vascular congestion and possible early interstitial edema. Bibasilar atelectasis. Electronically Signed   By: Rolm Baptise M.D.   On: 12/19/2019 21:59    EKG: Independently reviewed.  Atrial fibrillation CVR at 71  Assessment/Plan  Sepsis w/o shock due to occult infection  -?translocation of gut flora during recent scope  - place broad spectrum antibiotics -Trend inflammatory markers -f/u on blood cultures    DMII with mild early DKA  -ivfs / insulin sq per protocol  -q4h finger sticks  - q6 hours bmp  -transition to  drip if acidosis worsening s/q treatment  -due to occult infection    Abdominal pain s/p scope -resolved  -ct abdomen negative / no perforation or microperforation/no inflammation noted  Abnormal chest x-ray -Mild interstitial findings//suggestion of atelectasis -Patient with paucity of respiratory symptoms -Doubt pneumonia -Employee pulmonary toilet -Monitor respiratory symptoms  Asthma -no current exacerbation  -resume home medications   HTN -borderline  -resume home medications as able    HLD  -continue statin    Afib on eliquis   CAD  -no active issues   DVT prophylaxis:eliquis  Code Status:FULL  Family Communication: discussed with daughter at bed side Disposition Plan: 2-3 days Consults called: n/a Admission status:inpatient    Clance Boll MD [facility]   If 7PM-7AM, please contact night-coverage [email or web address] Password [facility]  12/19/2019, 11:25 PM

## 2019-12-19 NOTE — ED Triage Notes (Addendum)
Pt presents to [facility] for assessment after having a colonoscopy yesterday.  Patient states she woke up this morning to LLQ pain, took some tylenol with significant relief, but patient states she "just don't feel good".  Patient c/o nausea, and had some dry heaving last night.  Spoke to doctor who performed her colonoscopy and was recommended she go to the ER for further evaluation.  Patient states she did not want to go to hospital.

## 2019-12-19 NOTE — ED Notes (Signed)
Patient able to ambulate independently  

## 2019-12-19 NOTE — ED Provider Notes (Signed)
[facility]    CSN: [id number] Arrival date & time: 12/19/19  1221      History   Chief Complaint Chief Complaint  Patient presents with  . Abdominal Pain    HPI [name] is a 84 y.o. female.   84 year old female with history of asthma, DM, HTN, HLD, afib on eliquis, CAD comes in for LLQ pain. She had a colonoscopy yesterday morning. States procedure went well without immediate complications. She was able to restart eliquis and was tolerating oral intake. She woke up early this morning with sharp LLQ pain, nausea/dry heaving, and "just don't feel good". She had few watery diarrhea without melena or hematochezia. States abdominal pain significantly improved after tylenol. Had chills while having pain, that resolved since pain improved. Denies chest pain, shortness of breath. Continues to pass flatus. Tolerated minimal fluid intake, has not tried food intake. Called GI, who recommended ED evaluation, however, patient sates did not want to go and came to urgent care.      Past Medical History:  Diagnosis Date  . Allergic rhinitis   . Anterior chest wall pain   . Anxiety   . Asthma   . Cough   . DM type 2 (diabetes mellitus, type 2) ([place])   . GERD (gastroesophageal reflux disease)   . History of diverticulitis of colon   . HTN (hypertension)   . Hyperlipidemia   . IBS (irritable bowel syndrome)   . Iron deficiency anemia   . Osteoporosis, unspecified   . Renal insufficiency 07/31/2017  . UTI (urinary tract infection)     Patient Active Problem List   Diagnosis Date Noted  . DOE (dyspnea on exertion) 01/30/2018  . Acute bronchiolitis 12/17/2017  . Wheezing 10/29/2017  . Vitamin D deficiency 07/31/2017  . Renal insufficiency 07/31/2017  . Knee pain 06/01/2017  . Morbid obesity due to excess calories ([place]) 01/25/2017  . Diabetes ([place]) 04/12/2016  . Cough 01/20/2015  . Atrial fibrillation ([place]) 05/28/2014  . Personal history of colonic polyps 05/28/2014   . Vomiting 01/29/2014  . CAD (coronary artery disease) 09/01/2013  . Routine general medical examination at a health care facility 07/17/2011  . Encounter for long-term (current) use of other medications 07/06/2011  . Nonspecific (abnormal) findings on radiological and other examination of body structure 11/09/2009  . IRRITABLE BOWEL SYNDROME 05/07/2009  . CHEST PAIN 05/07/2009  . ADNEXAL MASS, RIGHT 07/22/2008  . HEMORRHOIDS, RECURRENT 01/27/2008  . Diverticulitis 01/27/2008  . OTH ABNORMAL FIND RAD EXAMINATION BREAST 01/27/2008  . GERD 01/13/2008  . UTI 09/28/2007  . Dyslipidemia 05/27/2007  . ANEMIA-IRON DEFICIENCY 05/27/2007  . ANXIETY 05/27/2007  . Essential hypertension 05/27/2007  . Seasonal allergic rhinitis 05/27/2007  . Cough variant asthma 05/27/2007  . Osteoporosis 05/27/2007  . DIVERTICULITIS, HX OF 05/27/2007    Past Surgical History:  Procedure Laterality Date  . CARDIOVASCULAR STRESS TEST  02/25/04  . ESOPHAGOGASTRODUODENOSCOPY  12/26/01  . RIGHT OOPHORECTOMY  jan 2010    OB History   No obstetric history on file.      Home Medications    Prior to Admission medications   Medication Sig Start Date End Date Taking? Authorizing Provider  acetaminophen (TYLENOL) 325 MG tablet Take 650 mg by mouth every 6 (six) hours as needed for pain.    [provider]  albuterol (PROAIR HFA) 108 (90 Base) MCG/ACT inhaler Inhale 2 puffs into the lungs every 4 (four) hours as needed for wheezing. 05/05/19 05/04/20  Wert, Michael B, MD  amLODipine (NORVASC) 5 MG tablet TAKE 1 TABLET BY MOUTH TWICE A DAY 04/14/19   Ross, Paula V, MD  Blood Glucose Monitoring Suppl (ONE TOUCH ULTRA 2) W/DEVICE KIT Check blood sugar 3 times per day. 07/24/14   Ellison, Sean, MD  budesonide-formoterol (SYMBICORT) 160-4.5 MCG/ACT inhaler Take 2 puffs first thing in am and then another 2 puffs about 12 hours later. 05/05/19   Wert, Michael B, MD  ELIQUIS 2.5 MG TABS tablet TAKE 1 TABLET BY MOUTH  TWICE A DAY 10/29/19   Ross, Paula V, MD  insulin lispro (HUMALOG) 100 UNIT/ML injection 3 units with breakfast, and 22 units with supper 06/12/19   Ellison, Sean, MD  Insulin Syringe-Needle U-100 (BD INSULIN SYRINGE ULTRAFINE) 31G X 5/16" 0.3 ML MISC Use as directed     [provider]  losartan-hydrochlorothiazide (HYZAAR) 100-25 MG tablet TAKE 1 TABLET BY MOUTH EVERY DAY 04/23/19   Ross, Paula V, MD  montelukast (SINGULAIR) 10 MG tablet Take 10 mg by mouth at bedtime.    [provider]  montelukast (SINGULAIR) 10 MG tablet TAKE 1 TABLET BY MOUTH AT BEDTIME 12/15/19   Wert, Michael B, MD  omeprazole (PRILOSEC) 40 MG capsule TAKE 1 CAPSULE BY MOUTH EVERY DAY 11/21/19   Murray, Laura Woodruff, FNP  ONE TOUCH LANCETS MISC Use as directed three times daily     [provider]  potassium chloride (KLOR-CON) 10 MEQ tablet TAKE 1 TABLET BY MOUTH EVERY DAY 12/16/19   Ross, Paula V, MD  pravastatin (PRAVACHOL) 40 MG tablet Take 1 tablet (40 mg total) by mouth daily. 08/27/19   Murray, Laura Woodruff, FNP  Vitamin D, Ergocalciferol, (DRISDOL) 1.25 MG (50000 UNIT) CAPS capsule TAKE 1 CAPSULE (50,000 UNITS TOTAL) BY MOUTH EVERY 7 (SEVEN) DAYS FOR 12 DOSES. 11/28/19 02/14/20  Murray, Laura Woodruff, FNP    Family History Family History  Problem Relation Age of Onset  . Lung cancer Brother   . Melanoma Brother   . Hypertension Mother   . Stroke Mother   . Other Father        poor circulation  . Atopy Neg Hx   . Asthma Neg Hx   . Breast cancer Neg Hx     Social History Social History   Tobacco Use  . Smoking status: Former Smoker    Packs/day: 0.30    Years: 5.00    Pack years: 1.50    Types: Cigarettes    Quit date: 10/02/1986    Years since quitting: 33.2  . Smokeless tobacco: Never Used  Substance Use Topics  . Alcohol use: No  . Drug use: No     Allergies   Aspirin and Ramipril   Review of Systems Review of Systems  Reason unable to perform ROS: See HPI as  above.     Physical Exam Triage Vital Signs ED Triage Vitals  Enc Vitals Group     BP 12/19/19 1237 (!) 165/79     Pulse Rate 12/19/19 1237 70     Resp 12/19/19 1237 (!) 22     Temp 12/19/19 1237 97.8 F (36.6 C)     Temp Source 12/19/19 1237 Temporal     SpO2 12/19/19 1237 95 %     Weight --      Height --      Head Circumference --      Peak Flow --      Pain Score 12/19/19 1235 0       Pain Loc --      Pain Edu? --      Excl. in GC? --    No data found.  Updated Vital Signs BP (!) 165/79 (BP Location: Left Arm) Comment: did not take BP meds  Pulse 70   Temp 97.8 F (36.6 C) (Temporal)   Resp (!) 22   SpO2 95%   Physical Exam Constitutional:      General: She is not in acute distress.    Appearance: She is well-developed. She is not ill-appearing, toxic-appearing or diaphoretic.  HENT:     Head: Normocephalic and atraumatic.  Eyes:     Conjunctiva/sclera: Conjunctivae normal.     Pupils: Pupils are equal, round, and reactive to light.  Cardiovascular:     Rate and Rhythm: Normal rate and regular rhythm.  Pulmonary:     Effort: Pulmonary effort is normal. No respiratory distress.     Comments: LCTAB Abdominal:     General: A surgical scar is present. Bowel sounds are normal.     Palpations: Abdomen is soft.     Tenderness: There is abdominal tenderness in the left lower quadrant. There is no right CVA tenderness, left CVA tenderness, guarding or rebound.  Musculoskeletal:     Cervical back: Normal range of motion and neck supple.  Skin:    General: Skin is warm and dry.  Neurological:     Mental Status: She is alert and oriented to person, place, and time.  Psychiatric:        Behavior: Behavior normal.        Judgment: Judgment normal.    UC Treatments / Results  Labs (all labs ordered are listed, but only abnormal results are displayed) Labs Reviewed - No data to display  EKG   Radiology No results found.  Procedures Procedures (including  critical care time)  Medications Ordered in UC Medications - No data to display  Initial Impression / Assessment and Plan / UC Course  I have reviewed the triage vital signs and the nursing notes.  Pertinent labs & imaging results that were available during my care of the patient were reviewed by me and considered in my medical decision making (see chart for details).    83-year-old female with history of DM, HTN, A. fib on Eliquis, diverticulitis comes in for left lower quadrant pain starting today after colonoscopy yesterday.  She had few diarrheal episodes without melena or hematochezia.  Has nausea without vomiting.  Denies fever.  Talked with GI provider, who recommended ED visit.  She was slightly tachypneic at triage, that resolved during exam.  No tachycardia, hypoxia, afebrile. Heart regular rate and rhythm, lungs clear to auscultation bilaterally without adventitious lung sounds. +BS, soft, LLQ pain without guarding or rebound.  Discussed with Dr Lamptey, given post procedure with recommendation from GI for ED visit, patient discharged at the ED for further evaluation.   Final Clinical Impressions(s) / UC Diagnoses   Final diagnoses:  LLQ pain  Post-op pain   ED Prescriptions    None     PDMP not reviewed this encounter.   Yu, Amy V, PA-C 12/19/19 1321  

## 2019-12-19 NOTE — ED Notes (Signed)
Pt transported to CT ?

## 2019-12-19 NOTE — ED Triage Notes (Signed)
Pt reports LLQ pain starting today. Pt had colonoscopy yesterday that was normal. Reports she feels better after having multiple BMs. Endorses mild nausea.

## 2019-12-19 NOTE — ED Provider Notes (Signed)
[place] EMERGENCY DEPARTMENT Provider Note   CSN: [id number] Arrival date & time: 12/19/19  1358     History Chief Complaint  Patient presents with  . Abdominal Pain    [name] is a 84 y.o. female.  84 y/o female with a PMH of HTN, DM presents to the ED with a chief complaint of LLQ pain x this morning. Patient reports having a colonoscopy yesterday by Dr. Earlean Shawl. She reports passing gas after the procedure, reports she had multiple bowel movements last night. States that today pain began at 4:30 am very severe along the left lower quadrant with no radiation. Reports taking some Tylenol without much improvement in her symptoms. States that she has had multiple bowel movements today as well, nonbloody. Reports her last meal was last night consisting of soup. Reports she is currently passing gas. Does have a prior history of diverticulitis, first colonoscopy report was within normal limits. No vomiting, no chest pain, no shortness of breath.     The history is provided by the patient and medical records.  Abdominal Pain Pain location:  LLQ Pain quality: sharp   Pain radiates to:  Does not radiate Pain severity:  Severe Onset quality:  Sudden Duration:  12 hours Timing:  Constant Progression:  Resolved Associated symptoms: diarrhea and nausea   Associated symptoms: no chest pain, no constipation, no fever, no shortness of breath, no sore throat and no vomiting        Past Medical History:  Diagnosis Date  . Allergic rhinitis   . Anterior chest wall pain   . Anxiety   . Asthma   . Cough   . DM type 2 (diabetes mellitus, type 2) ([place])   . GERD (gastroesophageal reflux disease)   . History of diverticulitis of colon   . HTN (hypertension)   . Hyperlipidemia   . IBS (irritable bowel syndrome)   . Iron deficiency anemia   . Osteoporosis, unspecified   . Renal insufficiency 07/31/2017  . UTI (urinary tract infection)     Patient Active Problem  List   Diagnosis Date Noted  . DOE (dyspnea on exertion) 01/30/2018  . Acute bronchiolitis 12/17/2017  . Wheezing 10/29/2017  . Vitamin D deficiency 07/31/2017  . Renal insufficiency 07/31/2017  . Knee pain 06/01/2017  . Morbid obesity due to excess calories ([place]) 01/25/2017  . Diabetes ([place]) 04/12/2016  . Cough 01/20/2015  . Atrial fibrillation ([place]) 05/28/2014  . Personal history of colonic polyps 05/28/2014  . Vomiting 01/29/2014  . CAD (coronary artery disease) 09/01/2013  . Routine general medical examination at a health care facility 07/17/2011  . Encounter for long-term (current) use of other medications 07/06/2011  . Nonspecific (abnormal) findings on radiological and other examination of body structure 11/09/2009  . IRRITABLE BOWEL SYNDROME 05/07/2009  . CHEST PAIN 05/07/2009  . ADNEXAL MASS, RIGHT 07/22/2008  . HEMORRHOIDS, RECURRENT 01/27/2008  . Diverticulitis 01/27/2008  . OTH ABNORMAL FIND RAD EXAMINATION BREAST 01/27/2008  . GERD 01/13/2008  . UTI 09/28/2007  . Dyslipidemia 05/27/2007  . ANEMIA-IRON DEFICIENCY 05/27/2007  . ANXIETY 05/27/2007  . Essential hypertension 05/27/2007  . Seasonal allergic rhinitis 05/27/2007  . Cough variant asthma 05/27/2007  . Osteoporosis 05/27/2007  . DIVERTICULITIS, HX OF 05/27/2007    Past Surgical History:  Procedure Laterality Date  . CARDIOVASCULAR STRESS TEST  02/25/04  . ESOPHAGOGASTRODUODENOSCOPY  12/26/01  . RIGHT OOPHORECTOMY  jan 2010     OB History   No obstetric history  on file.     Family History  Problem Relation Age of Onset  . Lung cancer Brother   . Melanoma Brother   . Hypertension Mother   . Stroke Mother   . Other Father        poor circulation  . Atopy Neg Hx   . Asthma Neg Hx   . Breast cancer Neg Hx     Social History   Tobacco Use  . Smoking status: Former Smoker    Packs/day: 0.30    Years: 5.00    Pack years: 1.50    Types: Cigarettes    Quit date: 10/02/1986    Years since  quitting: 33.2  . Smokeless tobacco: Never Used  Substance Use Topics  . Alcohol use: No  . Drug use: No    Home Medications Prior to Admission medications   Medication Sig Start Date End Date Taking? Authorizing Provider  acetaminophen (TYLENOL) 325 MG tablet Take 650 mg by mouth every 6 (six) hours as needed for pain.    [provider]  albuterol (PROAIR HFA) 108 (90 Base) MCG/ACT inhaler Inhale 2 puffs into the lungs every 4 (four) hours as needed for wheezing. 05/05/19 05/04/20  Tanda Rockers, MD  amLODipine (NORVASC) 5 MG tablet TAKE 1 TABLET BY MOUTH TWICE A DAY 04/14/19   Fay Records, MD  Blood Glucose Monitoring Suppl (ONE TOUCH ULTRA 2) W/DEVICE KIT Check blood sugar 3 times per day. 07/24/14   Renato Shin, MD  budesonide-formoterol [facility]) 160-4.5 MCG/ACT inhaler Take 2 puffs first thing in am and then another 2 puffs about 12 hours later. 05/05/19   Tanda Rockers, MD  ELIQUIS 2.5 MG TABS tablet TAKE 1 TABLET BY MOUTH TWICE A DAY 10/29/19   Fay Records, MD  insulin lispro (HUMALOG) 100 UNIT/ML injection 3 units with breakfast, and 22 units with supper 06/12/19   Renato Shin, MD  Insulin Syringe-Needle U-100 (BD INSULIN SYRINGE ULTRAFINE) 31G X 5/16" 0.3 ML MISC Use as directed     [provider]  losartan-hydrochlorothiazide (HYZAAR) 100-25 MG tablet TAKE 1 TABLET BY MOUTH EVERY DAY 04/23/19   Fay Records, MD  montelukast (SINGULAIR) 10 MG tablet Take 10 mg by mouth at bedtime.    [provider]  montelukast (SINGULAIR) 10 MG tablet TAKE 1 TABLET BY MOUTH AT BEDTIME 12/15/19   Tanda Rockers, MD  omeprazole (PRILOSEC) 40 MG capsule TAKE 1 CAPSULE BY MOUTH EVERY DAY 11/21/19   [name], FNP  ONE TOUCH LANCETS MISC Use as directed three times daily     [provider]  potassium chloride (KLOR-CON) 10 MEQ tablet TAKE 1 TABLET BY MOUTH EVERY DAY 12/16/19   Fay Records, MD  pravastatin (PRAVACHOL) 40 MG tablet Take 1 tablet  (40 mg total) by mouth daily. 08/27/19   [name], FNP  Vitamin D, Ergocalciferol, (DRISDOL) 1.25 MG (50000 UNIT) CAPS capsule TAKE 1 CAPSULE (50,000 UNITS TOTAL) BY MOUTH EVERY 7 (SEVEN) DAYS FOR 12 DOSES. 11/28/19 02/14/20  [name], FNP    Allergies    Aspirin and Ramipril  Review of Systems   Review of Systems  Constitutional: Negative for fever.  HENT: Negative for sore throat.   Respiratory: Negative for shortness of breath.   Cardiovascular: Negative for chest pain.  Gastrointestinal: Positive for abdominal pain, diarrhea and nausea. Negative for blood in stool, constipation and vomiting.  Genitourinary: Negative for flank pain.  Musculoskeletal: Negative for back  pain.  Skin: Negative for pallor.  Neurological: Negative for light-headedness and headaches.  All other systems reviewed and are negative.   Physical Exam Updated Vital Signs BP (!) 150/56   Pulse 67   Temp 99.6 F (37.6 C) (Oral)   Resp 20   Ht '5\' 7"'  (1.702 m)   Wt 89.4 kg   SpO2 95%   BMI 30.85 kg/m   Physical Exam Vitals and nursing note reviewed.  Constitutional:      Appearance: She is well-developed.  HENT:     Head: Normocephalic and atraumatic.     Mouth/Throat:     Mouth: Mucous membranes are dry.  Cardiovascular:     Rate and Rhythm: Normal rate.  Pulmonary:     Effort: Pulmonary effort is normal.     Breath sounds: No wheezing, rhonchi or rales.  Abdominal:     General: Abdomen is flat. A surgical scar is present. Bowel sounds are decreased.     Palpations: Abdomen is soft.     Tenderness: There is abdominal tenderness in the left lower quadrant. There is no right CVA tenderness, left CVA tenderness, guarding or rebound.     Hernia: No hernia is present.  Genitourinary:    Rectum: Guaiac stool:   Skin:    General: Skin is warm and dry.  Neurological:     Mental Status: She is alert and oriented to person, place, and time.     ED Results / Procedures /  Treatments   Labs (all labs ordered are listed, but only abnormal results are displayed) Labs Reviewed  COMPREHENSIVE METABOLIC PANEL - Abnormal; Notable for the following components:      Result Value   CO2 15 (*)    Glucose, Bld 234 (*)    Creatinine, Ser 2.35 (*)    Total Bilirubin 1.6 (*)    GFR calc non Af Amer 19 (*)    GFR calc Af Amer 22 (*)    All other components within normal limits  CBC - Abnormal; Notable for the following components:   WBC 22.7 (*)    Hemoglobin 11.1 (*)    HCT 33.4 (*)    RDW 16.3 (*)    Platelets 132 (*)    All other components within normal limits  URINALYSIS, ROUTINE W REFLEX MICROSCOPIC - Abnormal; Notable for the following components:   Bilirubin Urine SMALL (*)    Ketones, ur 15 (*)    Protein, ur 30 (*)    Leukocytes,Ua TRACE (*)    All other components within normal limits  URINALYSIS, MICROSCOPIC (REFLEX) - Abnormal; Notable for the following components:   Bacteria, UA FEW (*)    All other components within normal limits  LIPASE, BLOOD  BLOOD GAS, VENOUS    EKG EKG Interpretation  Date/Time:  Friday December 19 2019 14:08:58 EDT Ventricular Rate:  71 PR Interval:    QRS Duration: 122 QT Interval:  470 QTC Calculation: 510 R Axis:   102 Text Interpretation: Atrial fibrillation Right bundle branch block Abnormal ECG Confirmed by Lennice Sites [phone]) on 12/19/2019 6:27:47 PM   Radiology CT ABDOMEN PELVIS WO CONTRAST  Result Date: 12/19/2019 CLINICAL DATA:  Left lower quadrant pain EXAM: CT ABDOMEN AND PELVIS WITHOUT CONTRAST TECHNIQUE: Multidetector CT imaging of the abdomen and pelvis was performed following the standard protocol without IV contrast. COMPARISON:  2014 FINDINGS: Lower chest: Cardiomegaly.  Mild bibasilar atelectasis. Hepatobiliary: Cholelithiasis. Too small to characterize hypoattenuating liver lesions are unchanged. Pancreas: Foci of parenchymal  calcification likely reflecting sequelae of prior inflammation.  Spleen: Unremarkable. Adrenals/Urinary Tract: Adrenals are unremarkable. Kidneys are unremarkable. Bladder is poorly distended. Stomach/Bowel: Small hiatal hernia. Persistent esophageal wall thickening. Bowel is normal in caliber. Normal appendix. Vascular/Lymphatic: Aortic atherosclerosis. No enlarged abdominal or pelvic lymph nodes. Reproductive: Status post hysterectomy. No adnexal masses. Other: No abdominal wall hernia or abnormality. No abdominopelvic ascites. Musculoskeletal: Advanced degenerative changes at the hips. Multilevel degenerative changes the visualized spine. IMPRESSION: No findings to account for reported symptoms. Stable chronic findings detailed above. Electronically Signed   By: Macy Mis M.D.   On: 12/19/2019 20:19    Procedures Procedures (including critical care time)  Medications Ordered in ED Medications  sodium chloride 0.9 % bolus 500 mL (has no administration in time range)  sodium chloride 0.9 % bolus 1,000 mL (1,000 mLs Intravenous New Bag/Given 12/19/19 1906)  ondansetron (ZOFRAN) injection 4 mg (4 mg Intravenous Given 12/19/19 1942)    ED Course  I have reviewed the triage vital signs and the nursing notes.  Pertinent labs & imaging results that were available during my care of the patient were reviewed by me and considered in my medical decision making (see chart for details).  Clinical Course as of Dec 19 2103  Fri Dec 19, 2019  2051 Worsening than prior  Creatinine(!): 2.35 [JS]    Clinical Course User Index [JS] Janeece Fitting, PA-C   MDM Rules/Calculators/A&P  Patient with a PMH of hypertension, diabetes presents to the ED with a chief complaint of left lower quadrant pain that began this morning around 4 AM.  Patient had a colonoscopy yesterday performed by Dr. Murvin Natal, reports she has had multiple episodes of diarrhea since, it is passing gas.  Does feel that the pain along her left lower quadrant is very severe in nature, has taken some Tylenol  with mild improvement.  She did have multiple bowel movements which then ports improvement in her symptoms.  She does report some nausea, states her last meal was yesterday, she has been unable to eat any solids or liquids on today.   During my evaluation patient is overall well-appearing, vitals are within normal limits.  She is running a low temp fever 99.6.  Has had some emesis episodes, nonbilious nonbloody.  CMP remarkable for an elevated glucose, creatinine level was worsening on today's visit with a mild AKI.  Anion gap is 15, CO2 is decreased, some suspicion for DKA.  Will obtain a venous gas.  CBC with a leukocytosis of 22.7, suspect is likely due to to post procedure. UA showed ketones, negative nitrites, trace leukocytes, does not voice any urinary symptoms on today's visit.  CT Abdomen pelvis showed:  No findings to account for reported symptoms. Stable chronic  findings detailed above.      8:56 PM these results were discussed with patient at length, she is aware she will be getting another 500 bolus of fluids, will also obtain venous blood gas along with dispo after results.  Per patient along with daughter patient has not had any of her medications today, has not taken any of her insulin as she has not eaten all day.  Patient CARE signed out to Dr. Ronnald Nian at shift change.   Portions of this note were generated with [employer]. Dictation errors may occur despite best attempts at proofreading.  Final Clinical Impression(s) / ED Diagnoses Final diagnoses:  Lower abdominal pain    Rx / DC Orders ED Discharge Orders    None  Janeece Fitting, PA-C 12/19/19 2105    Lennice Sites, DO 12/19/19 2120    Lennice Sites, DO 12/19/19 2300

## 2019-12-19 NOTE — Discharge Instructions (Addendum)
84 year old female with history of DM, HTN, A. fib on Eliquis, diverticulitis comes in for left lower quadrant pain starting today after colonoscopy yesterday.  She had few diarrheal episodes without melena or hematochezia.  Has nausea without vomiting.  Denies fever.  Talked with GI provider, who recommended ED visit.  She was slightly tachypneic at triage, that resolved during exam.  No tachycardia, hypoxia, afebrile. Heart regular rate and rhythm, lungs clear to auscultation bilaterally without adventitious lung sounds. +BS, soft, LLQ pain without guarding or rebound.  Discussed with Dr Lanny Cramp, given post procedure with recommendation from GI for ED visit, patient discharged at the ED for further evaluation.

## 2019-12-20 ENCOUNTER — Encounter (HOSPITAL_COMMUNITY): Payer: Self-pay | Admitting: Internal Medicine

## 2019-12-20 DIAGNOSIS — N1832 Chronic kidney disease, stage 3b: Secondary | ICD-10-CM | POA: Diagnosis present

## 2019-12-20 DIAGNOSIS — R739 Hyperglycemia, unspecified: Secondary | ICD-10-CM | POA: Diagnosis not present

## 2019-12-20 DIAGNOSIS — E785 Hyperlipidemia, unspecified: Secondary | ICD-10-CM | POA: Diagnosis present

## 2019-12-20 DIAGNOSIS — Z886 Allergy status to analgesic agent status: Secondary | ICD-10-CM | POA: Diagnosis not present

## 2019-12-20 DIAGNOSIS — Z823 Family history of stroke: Secondary | ICD-10-CM | POA: Diagnosis not present

## 2019-12-20 DIAGNOSIS — Z808 Family history of malignant neoplasm of other organs or systems: Secondary | ICD-10-CM | POA: Diagnosis not present

## 2019-12-20 DIAGNOSIS — I4510 Unspecified right bundle-branch block: Secondary | ICD-10-CM | POA: Diagnosis present

## 2019-12-20 DIAGNOSIS — E1122 Type 2 diabetes mellitus with diabetic chronic kidney disease: Secondary | ICD-10-CM | POA: Diagnosis present

## 2019-12-20 DIAGNOSIS — Z7951 Long term (current) use of inhaled steroids: Secondary | ICD-10-CM | POA: Diagnosis not present

## 2019-12-20 DIAGNOSIS — I129 Hypertensive chronic kidney disease with stage 1 through stage 4 chronic kidney disease, or unspecified chronic kidney disease: Secondary | ICD-10-CM | POA: Diagnosis present

## 2019-12-20 DIAGNOSIS — N179 Acute kidney failure, unspecified: Secondary | ICD-10-CM | POA: Diagnosis not present

## 2019-12-20 DIAGNOSIS — R1030 Lower abdominal pain, unspecified: Secondary | ICD-10-CM | POA: Diagnosis not present

## 2019-12-20 DIAGNOSIS — J45909 Unspecified asthma, uncomplicated: Secondary | ICD-10-CM | POA: Diagnosis present

## 2019-12-20 DIAGNOSIS — Z8249 Family history of ischemic heart disease and other diseases of the circulatory system: Secondary | ICD-10-CM | POA: Diagnosis not present

## 2019-12-20 DIAGNOSIS — E1110 Type 2 diabetes mellitus with ketoacidosis without coma: Secondary | ICD-10-CM | POA: Diagnosis present

## 2019-12-20 DIAGNOSIS — M810 Age-related osteoporosis without current pathological fracture: Secondary | ICD-10-CM | POA: Diagnosis present

## 2019-12-20 DIAGNOSIS — R1032 Left lower quadrant pain: Secondary | ICD-10-CM | POA: Diagnosis not present

## 2019-12-20 DIAGNOSIS — R933 Abnormal findings on diagnostic imaging of other parts of digestive tract: Secondary | ICD-10-CM | POA: Diagnosis not present

## 2019-12-20 DIAGNOSIS — Z20822 Contact with and (suspected) exposure to covid-19: Secondary | ICD-10-CM | POA: Diagnosis present

## 2019-12-20 DIAGNOSIS — K219 Gastro-esophageal reflux disease without esophagitis: Secondary | ICD-10-CM | POA: Diagnosis present

## 2019-12-20 DIAGNOSIS — Z7901 Long term (current) use of anticoagulants: Secondary | ICD-10-CM | POA: Diagnosis not present

## 2019-12-20 DIAGNOSIS — I2510 Atherosclerotic heart disease of native coronary artery without angina pectoris: Secondary | ICD-10-CM | POA: Diagnosis present

## 2019-12-20 DIAGNOSIS — Z801 Family history of malignant neoplasm of trachea, bronchus and lung: Secondary | ICD-10-CM | POA: Diagnosis not present

## 2019-12-20 DIAGNOSIS — Z794 Long term (current) use of insulin: Secondary | ICD-10-CM | POA: Diagnosis not present

## 2019-12-20 DIAGNOSIS — I4891 Unspecified atrial fibrillation: Secondary | ICD-10-CM | POA: Diagnosis present

## 2019-12-20 DIAGNOSIS — Z888 Allergy status to other drugs, medicaments and biological substances status: Secondary | ICD-10-CM | POA: Diagnosis not present

## 2019-12-20 DIAGNOSIS — D72829 Elevated white blood cell count, unspecified: Secondary | ICD-10-CM | POA: Diagnosis not present

## 2019-12-20 DIAGNOSIS — Z87891 Personal history of nicotine dependence: Secondary | ICD-10-CM | POA: Diagnosis not present

## 2019-12-20 DIAGNOSIS — A419 Sepsis, unspecified organism: Secondary | ICD-10-CM | POA: Diagnosis present

## 2019-12-20 LAB — CBC WITH DIFFERENTIAL/PLATELET
Abs Immature Granulocytes: 0.12 10*3/uL — ABNORMAL HIGH (ref 0.00–0.07)
Basophils Absolute: 0 10*3/uL (ref 0.0–0.1)
Basophils Relative: 0 %
Eosinophils Absolute: 0.1 10*3/uL (ref 0.0–0.5)
Eosinophils Relative: 0 %
HCT: 28.9 % — ABNORMAL LOW (ref 36.0–46.0)
Hemoglobin: 10 g/dL — ABNORMAL LOW (ref 12.0–15.0)
Immature Granulocytes: 1 %
Lymphocytes Relative: 6 %
Lymphs Abs: 1.2 10*3/uL (ref 0.7–4.0)
MCH: 28.6 pg (ref 26.0–34.0)
MCHC: 34.6 g/dL (ref 30.0–36.0)
MCV: 82.6 fL (ref 80.0–100.0)
Monocytes Absolute: 1.3 10*3/uL — ABNORMAL HIGH (ref 0.1–1.0)
Monocytes Relative: 7 %
Neutro Abs: 16.3 10*3/uL — ABNORMAL HIGH (ref 1.7–7.7)
Neutrophils Relative %: 86 %
Platelets: 122 10*3/uL — ABNORMAL LOW (ref 150–400)
RBC: 3.5 MIL/uL — ABNORMAL LOW (ref 3.87–5.11)
RDW: 15.9 % — ABNORMAL HIGH (ref 11.5–15.5)
WBC: 19 10*3/uL — ABNORMAL HIGH (ref 4.0–10.5)
nRBC: 0 % (ref 0.0–0.2)

## 2019-12-20 LAB — BLOOD GAS, ARTERIAL
Acid-base deficit: 5.4 mmol/L — ABNORMAL HIGH (ref 0.0–2.0)
Bicarbonate: 19.3 mmol/L — ABNORMAL LOW (ref 20.0–28.0)
FIO2: 21
O2 Saturation: 92.1 %
Patient temperature: 37.9
pCO2 arterial: 38 mmHg (ref 32.0–48.0)
pH, Arterial: 7.331 — ABNORMAL LOW (ref 7.350–7.450)
pO2, Arterial: 72 mmHg — ABNORMAL LOW (ref 83.0–108.0)

## 2019-12-20 LAB — COMPREHENSIVE METABOLIC PANEL
ALT: 43 U/L (ref 0–44)
AST: 46 U/L — ABNORMAL HIGH (ref 15–41)
Albumin: 3 g/dL — ABNORMAL LOW (ref 3.5–5.0)
Alkaline Phosphatase: 50 U/L (ref 38–126)
Anion gap: 10 (ref 5–15)
BUN: 27 mg/dL — ABNORMAL HIGH (ref 8–23)
CO2: 17 mmol/L — ABNORMAL LOW (ref 22–32)
Calcium: 7.9 mg/dL — ABNORMAL LOW (ref 8.9–10.3)
Chloride: 111 mmol/L (ref 98–111)
Creatinine, Ser: 2.51 mg/dL — ABNORMAL HIGH (ref 0.44–1.00)
GFR calc Af Amer: 20 mL/min — ABNORMAL LOW (ref >60)
GFR calc non Af Amer: 17 mL/min — ABNORMAL LOW (ref >60)
Glucose, Bld: 147 mg/dL — ABNORMAL HIGH (ref 70–99)
Potassium: 3.9 mmol/L (ref 3.5–5.1)
Sodium: 138 mmol/L (ref 135–145)
Total Bilirubin: 1.2 mg/dL (ref 0.3–1.2)
Total Protein: 6 g/dL — ABNORMAL LOW (ref 6.5–8.1)

## 2019-12-20 LAB — GLUCOSE, CAPILLARY
Glucose-Capillary: 115 mg/dL — ABNORMAL HIGH (ref 70–99)
Glucose-Capillary: 102 mg/dL — ABNORMAL HIGH (ref 70–99)
Glucose-Capillary: 138 mg/dL — ABNORMAL HIGH (ref 70–99)
Glucose-Capillary: 131 mg/dL — ABNORMAL HIGH (ref 70–99)
Glucose-Capillary: 197 mg/dL — ABNORMAL HIGH (ref 70–99)

## 2019-12-20 LAB — BASIC METABOLIC PANEL
Anion gap: 10 (ref 5–15)
Anion gap: 9 (ref 5–15)
BUN: 27 mg/dL — ABNORMAL HIGH (ref 8–23)
BUN: 26 mg/dL — ABNORMAL HIGH (ref 8–23)
CO2: 19 mmol/L — ABNORMAL LOW (ref 22–32)
CO2: 19 mmol/L — ABNORMAL LOW (ref 22–32)
Calcium: 8 mg/dL — ABNORMAL LOW (ref 8.9–10.3)
Calcium: 7.9 mg/dL — ABNORMAL LOW (ref 8.9–10.3)
Chloride: 111 mmol/L (ref 98–111)
Chloride: 111 mmol/L (ref 98–111)
Creatinine, Ser: 2.41 mg/dL — ABNORMAL HIGH (ref 0.44–1.00)
Creatinine, Ser: 2.31 mg/dL — ABNORMAL HIGH (ref 0.44–1.00)
GFR calc Af Amer: 21 mL/min — ABNORMAL LOW (ref >60)
GFR calc Af Amer: 22 mL/min — ABNORMAL LOW (ref >60)
GFR calc non Af Amer: 18 mL/min — ABNORMAL LOW (ref >60)
GFR calc non Af Amer: 19 mL/min — ABNORMAL LOW (ref >60)
Glucose, Bld: 117 mg/dL — ABNORMAL HIGH (ref 70–99)
Glucose, Bld: 142 mg/dL — ABNORMAL HIGH (ref 70–99)
Potassium: 3.7 mmol/L (ref 3.5–5.1)
Potassium: 3.7 mmol/L (ref 3.5–5.1)
Sodium: 140 mmol/L (ref 135–145)
Sodium: 139 mmol/L (ref 135–145)

## 2019-12-20 LAB — CBC
HCT: 27.1 % — ABNORMAL LOW (ref 36.0–46.0)
Hemoglobin: 9.4 g/dL — ABNORMAL LOW (ref 12.0–15.0)
MCH: 28.1 pg (ref 26.0–34.0)
MCHC: 34.7 g/dL (ref 30.0–36.0)
MCV: 81.1 fL (ref 80.0–100.0)
Platelets: 106 10*3/uL — ABNORMAL LOW (ref 150–400)
RBC: 3.34 MIL/uL — ABNORMAL LOW (ref 3.87–5.11)
RDW: 15.6 % — ABNORMAL HIGH (ref 11.5–15.5)
WBC: 17.5 10*3/uL — ABNORMAL HIGH (ref 4.0–10.5)
nRBC: 0 % (ref 0.0–0.2)

## 2019-12-20 LAB — PROCALCITONIN: Procalcitonin: 61.66 ng/mL

## 2019-12-20 LAB — LACTIC ACID, PLASMA
Lactic Acid, Venous: 1.3 mmol/L (ref 0.5–1.9)
Lactic Acid, Venous: 1.8 mmol/L (ref 0.5–1.9)
Lactic Acid, Venous: 1.8 mmol/L (ref 0.5–1.9)

## 2019-12-20 LAB — HEMOGLOBIN A1C
Hgb A1c MFr Bld: 5.6 % (ref 4.8–5.6)
Mean Plasma Glucose: 114.02 mg/dL

## 2019-12-20 LAB — PROTIME-INR
INR: 1.3 — ABNORMAL HIGH (ref 0.8–1.2)
Prothrombin Time: 16.1 seconds — ABNORMAL HIGH (ref 11.4–15.2)

## 2019-12-20 LAB — C-REACTIVE PROTEIN: CRP: 11.6 mg/dL — ABNORMAL HIGH (ref <1.0)

## 2019-12-20 LAB — SARS CORONAVIRUS 2 (TAT 6-24 HRS): SARS Coronavirus 2: NEGATIVE

## 2019-12-20 LAB — BRAIN NATRIURETIC PEPTIDE: B Natriuretic Peptide: 272.8 pg/mL — ABNORMAL HIGH (ref 0.0–100.0)

## 2019-12-20 LAB — CBG MONITORING, ED: Glucose-Capillary: 129 mg/dL — ABNORMAL HIGH (ref 70–99)

## 2019-12-20 LAB — APTT: aPTT: 31 seconds (ref 24–36)

## 2019-12-20 MED ORDER — ALBUTEROL SULFATE HFA 108 (90 BASE) MCG/ACT IN AERS
2.0000 | INHALATION_SPRAY | RESPIRATORY_TRACT | Status: DC | PRN
  Filled 2019-12-20: qty 6.7

## 2019-12-20 MED ORDER — ONDANSETRON HCL 4 MG/2ML IJ SOLN
4.0000 mg | Freq: Four times a day (QID) | INTRAMUSCULAR | Status: DC | PRN
  Administered 2019-12-20 – 2019-12-22 (×2): 4 mg via INTRAVENOUS
  Filled 2019-12-20 (×2): qty 2

## 2019-12-20 MED ORDER — ACETAMINOPHEN 325 MG PO TABS
650.0000 mg | ORAL_TABLET | Freq: Four times a day (QID) | ORAL | Status: DC | PRN
  Administered 2019-12-21 – 2019-12-22 (×3): 650 mg via ORAL
  Filled 2019-12-20 (×3): qty 2

## 2019-12-20 MED ORDER — MOMETASONE FURO-FORMOTEROL FUM 200-5 MCG/ACT IN AERO
2.0000 | INHALATION_SPRAY | Freq: Two times a day (BID) | RESPIRATORY_TRACT | Status: DC
  Administered 2019-12-20 – 2019-12-22 (×4): 2 via RESPIRATORY_TRACT
  Filled 2019-12-20: qty 8.8

## 2019-12-20 MED ORDER — INSULIN GLARGINE 100 UNIT/ML ~~LOC~~ SOLN
10.0000 [IU] | Freq: Every day | SUBCUTANEOUS | Status: DC
  Filled 2019-12-20 (×3): qty 0.1

## 2019-12-20 MED ORDER — AMLODIPINE BESYLATE 5 MG PO TABS
5.0000 mg | ORAL_TABLET | Freq: Every day | ORAL | Status: DC
  Administered 2019-12-20 – 2019-12-22 (×3): 5 mg via ORAL
  Filled 2019-12-20 (×3): qty 1

## 2019-12-20 MED ORDER — METRONIDAZOLE IN NACL 5-0.79 MG/ML-% IV SOLN
500.0000 mg | Freq: Three times a day (TID) | INTRAVENOUS | Status: DC
  Administered 2019-12-20 – 2019-12-22 (×8): 500 mg via INTRAVENOUS
  Filled 2019-12-20 (×7): qty 100

## 2019-12-20 MED ORDER — APIXABAN 2.5 MG PO TABS
2.5000 mg | ORAL_TABLET | Freq: Two times a day (BID) | ORAL | Status: DC
  Administered 2019-12-20 – 2019-12-22 (×5): 2.5 mg via ORAL
  Filled 2019-12-20 (×5): qty 1

## 2019-12-20 MED ORDER — ACETAMINOPHEN 650 MG RE SUPP: 650.0000 mg | Freq: Four times a day (QID) | RECTAL | Status: DC | PRN

## 2019-12-20 MED ORDER — PRAVASTATIN SODIUM 40 MG PO TABS
40.0000 mg | ORAL_TABLET | Freq: Every day | ORAL | Status: DC
  Administered 2019-12-20 – 2019-12-22 (×3): 40 mg via ORAL
  Filled 2019-12-20 (×3): qty 1

## 2019-12-20 MED ORDER — MONTELUKAST SODIUM 10 MG PO TABS
10.0000 mg | ORAL_TABLET | Freq: Every day | ORAL | Status: DC
  Administered 2019-12-20 – 2019-12-21 (×2): 10 mg via ORAL
  Filled 2019-12-20 (×2): qty 1

## 2019-12-20 MED ORDER — SODIUM CHLORIDE 0.9 % IV SOLN
2.0000 g | Freq: Every day | INTRAVENOUS | Status: DC
  Administered 2019-12-20 – 2019-12-21 (×3): 2 g via INTRAVENOUS
  Filled 2019-12-20 (×2): qty 20
  Filled 2019-12-20: qty 2
  Filled 2019-12-20: qty 20

## 2019-12-20 MED ORDER — ALBUTEROL SULFATE (2.5 MG/3ML) 0.083% IN NEBU
2.5000 mg | INHALATION_SOLUTION | RESPIRATORY_TRACT | Status: DC | PRN
  Administered 2019-12-20 – 2019-12-22 (×4): 2.5 mg via RESPIRATORY_TRACT
  Filled 2019-12-20 (×4): qty 3

## 2019-12-20 MED ORDER — ONDANSETRON HCL 4 MG PO TABS: 4.0000 mg | ORAL_TABLET | Freq: Four times a day (QID) | ORAL | Status: DC | PRN

## 2019-12-20 MED ORDER — SODIUM CHLORIDE 0.9 % IV SOLN: INTRAVENOUS | Status: DC

## 2019-12-20 MED ORDER — INSULIN ASPART 100 UNIT/ML ~~LOC~~ SOLN
0.0000 [IU] | SUBCUTANEOUS | Status: DC
  Administered 2019-12-20 (×3): 1 [IU] via SUBCUTANEOUS
  Administered 2019-12-20: 21:00:00 2 [IU] via SUBCUTANEOUS
  Administered 2019-12-21: 12:00:00 1 [IU] via SUBCUTANEOUS
  Administered 2019-12-21: 2 [IU] via SUBCUTANEOUS
  Administered 2019-12-21 – 2019-12-22 (×4): 1 [IU] via SUBCUTANEOUS

## 2019-12-20 MED ORDER — INSULIN GLARGINE 100 UNIT/ML ~~LOC~~ SOLN
5.0000 [IU] | Freq: Two times a day (BID) | SUBCUTANEOUS | Status: DC
  Administered 2019-12-20: 12:00:00 5 [IU] via SUBCUTANEOUS
  Filled 2019-12-20 (×2): qty 0.05

## 2019-12-20 NOTE — Consult Note (Signed)
[facility] Consultation Note  Referring Provider: [facility] Primary Care Physician:  [name], FNP Primary Gastroenterologist:  Dr. Earlean Shawl  Reason for Consultation:  Abdominal pain  HPI: [name] is a 84 y.o. female whom we've been asked to see for abdominal pain.  Patient is longstanding patient of Dr. Earlean Shawl.  She has history of LLQ pain, sharp, intermittent for the past one year.  To investigate this, she had colonoscopy couple days ago with Dr. Earlean Shawl; no report available, but per care everywhere notes nothing was found and no biopsies or polypectomies done.  She reports yesterday, the day after her colonoscopy, she began having worsening similar, but of greater intensity, LLQ pain with some chills and fevers.  Reports similar symptoms about year ago for which she was treated for diverticulitis.  CT scan, without oral or IV contrast, yesterday showed no perforation or other acute complication.   Past Medical History:  Diagnosis Date  . Allergic rhinitis   . Anterior chest wall pain   . Anxiety   . Asthma   . Cough   . DM type 2 (diabetes mellitus, type 2) ([place])   . GERD (gastroesophageal reflux disease)   . History of diverticulitis of colon   . HTN (hypertension)   . Hyperlipidemia   . IBS (irritable bowel syndrome)   . Iron deficiency anemia   . Osteoporosis, unspecified   . Renal insufficiency 07/31/2017  . UTI (urinary tract infection)     Past Surgical History:  Procedure Laterality Date  . CARDIOVASCULAR STRESS TEST  02/25/04  . ESOPHAGOGASTRODUODENOSCOPY  12/26/01  . RIGHT OOPHORECTOMY  jan 2010    Prior to Admission medications   Medication Sig Start Date End Date Taking? Authorizing Provider  acetaminophen (TYLENOL) 325 MG tablet Take 650 mg by mouth every 6 (six) hours as needed for pain.   Yes [provider]  albuterol (PROAIR HFA) 108 (90 Base) MCG/ACT inhaler Inhale 2 puffs into the lungs every 4 (four) hours as  needed for wheezing. 05/05/19 05/04/20 Yes Tanda Rockers, MD  amLODipine (NORVASC) 5 MG tablet TAKE 1 TABLET BY MOUTH TWICE A DAY Patient taking differently: Take 5 mg by mouth in the morning and at bedtime.  04/14/19  Yes Fay Records, MD  ascorbic acid (VITAMIN C) 500 MG tablet Take 500-1,000 mg by mouth daily.   Yes [provider]  budesonide-formoterol (SYMBICORT) 160-4.5 MCG/ACT inhaler Take 2 puffs first thing in am and then another 2 puffs about 12 hours later. Patient taking differently: Inhale 2 puffs into the lungs every 12 (twelve) hours.  05/05/19  Yes Tanda Rockers, MD  ELIQUIS 2.5 MG TABS tablet TAKE 1 TABLET BY MOUTH TWICE A DAY Patient taking differently: Take 2.5 mg by mouth 2 (two) times daily.  10/29/19  Yes Fay Records, MD  insulin lispro (HUMALOG) 100 UNIT/ML injection 3 units with breakfast, and 22 units with supper Patient taking differently: Inject 5-25 Units into the skin See admin instructions. Inject 5 units into the skin in the morning before breakfast and 25 units before supper 06/12/19  Yes Renato Shin, MD  losartan-hydrochlorothiazide (HYZAAR) 100-25 MG tablet TAKE 1 TABLET BY MOUTH EVERY DAY Patient taking differently: Take 1 tablet by mouth daily.  04/23/19  Yes Fay Records, MD  montelukast (SINGULAIR) 10 MG tablet TAKE 1 TABLET BY MOUTH AT BEDTIME Patient taking differently: Take 10 mg by mouth at bedtime.  12/15/19  Yes Tanda Rockers, MD  omeprazole (PRILOSEC) 40 MG capsule TAKE 1 CAPSULE BY MOUTH EVERY DAY Patient taking differently: Take 40 mg by mouth daily before breakfast.  11/21/19  Yes [name], FNP  potassium chloride (KLOR-CON) 10 MEQ tablet TAKE 1 TABLET BY MOUTH EVERY DAY Patient taking differently: Take 10 mEq by mouth daily.  12/16/19  Yes Fay Records, MD  pravastatin (PRAVACHOL) 40 MG tablet Take 1 tablet (40 mg total) by mouth daily. 08/27/19  Yes [name], FNP  Blood Glucose Monitoring Suppl (ONE TOUCH ULTRA  2) W/DEVICE KIT Check blood sugar 3 times per day. 07/24/14   Renato Shin, MD  Insulin Syringe-Needle U-100 (BD INSULIN SYRINGE ULTRAFINE) 31G X 5/16" 0.3 ML MISC Use as directed     [provider]  ONE TOUCH LANCETS MISC Use as directed three times daily     [provider]  Vitamin D, Ergocalciferol, (DRISDOL) 1.25 MG (50000 UNIT) CAPS capsule TAKE 1 CAPSULE (50,000 UNITS TOTAL) BY MOUTH EVERY 7 (SEVEN) DAYS FOR 12 DOSES. Patient not taking: Reported on 12/19/2019 11/28/19 02/14/20  [name], FNP    Current Facility-Administered Medications  Medication Dose Route Frequency Provider Last Rate Last Admin  . 0.9 %  sodium chloride infusion   Intravenous Continuous Clance Boll, MD 75 mL/hr at 12/20/19 0053 New Bag at 12/20/19 0053  . acetaminophen (TYLENOL) tablet 650 mg  650 mg Oral Q6H PRN Clance Boll, MD       Or  . acetaminophen (TYLENOL) suppository 650 mg  650 mg Rectal Q6H PRN Myles Rosenthal A, MD      . cefTRIAXone (ROCEPHIN) 2 g in sodium chloride 0.9 % 100 mL IVPB  2 g Intravenous QHS Clance Boll, MD   Stopped at 12/20/19 0124  . insulin aspart (novoLOG) injection 0-9 Units  0-9 Units Subcutaneous Q4H Clance Boll, MD   1 Units at 12/20/19 0114  . insulin glargine (LANTUS) injection 10 Units  10 Units Subcutaneous Daily Myles Rosenthal A, MD      . metroNIDAZOLE (FLAGYL) IVPB 500 mg  500 mg Intravenous Q8H Myles Rosenthal A, MD 100 mL/hr at 12/20/19 0921 500 mg at 12/20/19 0921  . ondansetron (ZOFRAN) tablet 4 mg  4 mg Oral Q6H PRN Clance Boll, MD       Or  . ondansetron [facility]) injection 4 mg  4 mg Intravenous Q6H PRN Clance Boll, MD        Allergies as of 12/19/2019 - Review Complete 12/19/2019  Allergen Reaction Noted  . Aspirin Shortness Of Breath and Other (See Comments) 07/06/2011  . Ramipril Cough 12/10/2019    Family History  Problem Relation Age of Onset  . Lung cancer Brother   .  Melanoma Brother   . Hypertension Mother   . Stroke Mother   . Other Father        poor circulation  . Atopy Neg Hx   . Asthma Neg Hx   . Breast cancer Neg Hx     Social History   Socioeconomic History  . Marital status: Widowed    Spouse name: Not on file  . Number of children: Not on file  . Years of education: Not on file  . Highest education level: Not on file  Occupational History  . Occupation: retired  Tobacco Use  . Smoking status: Former Smoker    Packs/day: 0.30    Years: 5.00    Pack years: 1.50    Types:  Cigarettes    Quit date: 10/02/1986    Years since quitting: 33.2  . Smokeless tobacco: Never Used  Substance and Sexual Activity  . Alcohol use: No  . Drug use: No  . Sexual activity: Not on file  Other Topics Concern  . Not on file  Social History Narrative  . Not on file   Social Determinants of Health   Financial Resource Strain:   . Difficulty of Paying Living Expenses:   Food Insecurity:   . Worried About Charity fundraiser in the Last Year:   . Arboriculturist in the Last Year:   Transportation Needs:   . Film/video editor (Medical):   Marland Kitchen Lack of Transportation (Non-Medical):   Physical Activity:   . Days of Exercise per Week:   . Minutes of Exercise per Session:   Stress:   . Feeling of Stress :   Social Connections:   . Frequency of Communication with Friends and Family:   . Frequency of Social Gatherings with Friends and Family:   . Attends Religious Services:   . Active Member of Clubs or Organizations:   . Attends Archivist Meetings:   Marland Kitchen Marital Status:   Intimate Partner Violence:   . Fear of Current or Ex-Partner:   . Emotionally Abused:   Marland Kitchen Physically Abused:   . Sexually Abused:     Review of Systems: As per HPI, all others negative  Physical Exam: Vital signs in last 24 hours: Temp:  [97.8 F (36.6 C)-100.2 F (37.9 C)] 98.8 F (37.1 C) (03/20 0820) Pulse Rate:  [46-73] 54 (03/20 0820) Resp:   [16-25] 18 (03/20 0820) BP: (122-173)/(37-79) 122/58 (03/20 0820) SpO2:  [93 %-100 %] 96 % (03/20 0820) Weight:  [88 kg-89.4 kg] 88 kg (03/20 0239) Last BM Date: 12/20/19 General:   Alert,  Overweight,  pleasant and cooperative in NAD Head:  Normocephalic and atraumatic. Eyes:  Sclera clear, no icterus.   Conjunctiva pink. Ears:  Normal auditory acuity. Nose:  No deformity, discharge,  or lesions. Mouth:  No deformity or lesions.  Oropharynx pink & moist. Neck:  Supple; no masses or thyromegaly. Lungs:  Clear throughout to auscultation.   No wheezes, crackles, or rhonchi. No acute distress. Heart:  Regular rate and rhythm; no murmurs, clicks, rubs,  or gallops. Abdomen:  Soft, protuberant, mild to moderate tenderness LLQ with mild voluntary guarding; no peritonitis. No masses, hepatosplenomegaly or hernias noted. Normal bowel sounds, without guarding, and without rebound.     Msk:  Symmetrical without gross deformities. Normal posture. Pulses:  Normal pulses noted. Extremities:  Without clubbing or edema. Neurologic:  Alert and  oriented x4;  grossly normal neurologically. Skin:  Intact without significant lesions or rashes Psych:  Alert and cooperative. Normal mood and affect.   Lab Results: Recent Labs    12/19/19 1413 12/19/19 1413 12/19/19 2149 12/20/19 0008 12/20/19 0429  WBC 22.7*  --   --  19.0* 17.5*  HGB 11.1*   < > 11.2* 10.0* 9.4*  HCT 33.4*   < > 33.0* 28.9* 27.1*  PLT 132*  --   --  122* 106*   < > = values in this interval not displayed.   BMET Recent Labs    12/19/19 1413 12/19/19 1413 12/19/19 2149 12/20/19 0008 12/20/19 0429  NA 139   < > 142 138 140  K 4.2   < > 4.0 3.9 3.7  CL 109  --   --  111 111  CO2 15*  --   --  17* 19*  GLUCOSE 234*  --   --  147* 117*  BUN 22  --   --  27* 27*  CREATININE 2.35*  --   --  2.51* 2.41*  CALCIUM 8.9  --   --  7.9* 8.0*   < > = values in this interval not displayed.   LFT Recent Labs    12/20/19 0008   PROT 6.0*  ALBUMIN 3.0*  AST 46*  ALT 43  ALKPHOS 50  BILITOT 1.2   PT/INR Recent Labs    12/20/19 0008  LABPROT 16.1*  INR 1.3*    Studies/Results: CT ABDOMEN PELVIS WO CONTRAST  Result Date: 12/19/2019 CLINICAL DATA:  Left lower quadrant pain EXAM: CT ABDOMEN AND PELVIS WITHOUT CONTRAST TECHNIQUE: Multidetector CT imaging of the abdomen and pelvis was performed following the standard protocol without IV contrast. COMPARISON:  2014 FINDINGS: Lower chest: Cardiomegaly.  Mild bibasilar atelectasis. Hepatobiliary: Cholelithiasis. Too small to characterize hypoattenuating liver lesions are unchanged. Pancreas: Foci of parenchymal calcification likely reflecting sequelae of prior inflammation. Spleen: Unremarkable. Adrenals/Urinary Tract: Adrenals are unremarkable. Kidneys are unremarkable. Bladder is poorly distended. Stomach/Bowel: Small hiatal hernia. Persistent esophageal wall thickening. Bowel is normal in caliber. Normal appendix. Vascular/Lymphatic: Aortic atherosclerosis. No enlarged abdominal or pelvic lymph nodes. Reproductive: Status post hysterectomy. No adnexal masses. Other: No abdominal wall hernia or abnormality. No abdominopelvic ascites. Musculoskeletal: Advanced degenerative changes at the hips. Multilevel degenerative changes the visualized spine. IMPRESSION: No findings to account for reported symptoms. Stable chronic findings detailed above. Electronically Signed   By: Macy Mis M.D.   On: 12/19/2019 20:19   DG Chest Portable 1 View  Result Date: 12/19/2019 CLINICAL DATA:  Fever EXAM: PORTABLE CHEST 1 VIEW COMPARISON:  05/17/2018 FINDINGS: Cardiomegaly, vascular congestion. Bibasilar atelectasis. Interstitial prominence could reflect early interstitial edema. No effusions or acute bony abnormality. IMPRESSION: Cardiomegaly with vascular congestion and possible early interstitial edema. Bibasilar atelectasis. Electronically Signed   By: Rolm Baptise M.D.   On: 12/19/2019  21:59    Impression:  1.  LLQ pain, acute on chronic; worse since colonoscopy; CT without contrast didn't show perforation or other acute findings.  Suspect chronic IBS-type pain or chronic diverticular-associated spasm pain, with acute contribution most likely from mesenteric stretch during colonoscopy; subtle diverticulitis can't be excluded either clinically or based on patient's only having non-contrasted CT scan. 2.  Leukocytosis. 3.  Chills. 4.  Chronic renal insufficiency?  Plan:  1.  Continue antibiotics IV, if patient better tomorrow perhaps transition to oral antibiotics. 2.  Clear liquid diet. 3.  Supportive management with IV fluids, judicious analgesics. 4.  If pain better and improving leukocytosis and no fevers, consider advance diet and possible discharge home tomorrow; if worsening pain, may consider further imaging with oral +/- IV contrast (although only if her creatinine level improves would we be able to administer IV contrast). 5.  Case discussed with Dr. Darrick Meigs. 6.  [facility] will follow.    LOS: 0 days   Shaquasha Gerstel M  12/20/2019, 9:46 AM  Cell [phone] If no answer or after 5 PM call [phone]

## 2019-12-20 NOTE — ED Notes (Signed)
Paged MD again due to pt being bradycardic. Still awaiting orders or response

## 2019-12-20 NOTE — ED Notes (Signed)
Pt noted on cardiac monitor to have HR in the 40s once hooked up upon arrival since moved from different pod in ER. Pt she is asymptomatic but rate is still around 40s-50s, was previously 60s-70s when initially arrived to ER. MD paged, awaiting orders and shot new EKG

## 2019-12-20 NOTE — ED Notes (Signed)
Spoke with MD who recommended no additional orders, and to continue to monitor

## 2019-12-20 NOTE — Progress Notes (Addendum)
[facility]  PROGRESS NOTE  [name] UUV:[id number] DOB: 1936/06/09 DOA: 12/19/2019 PCP: [name], FNP   Brief HPI:   84 year old female with history of asthma, diabetes mellitus type 2, hypertension, hyperlipidemia, atrial fibrillation on Eliquis, CAD presented with left lower quadrant pain.  Patient has colonoscopy on 12/18/2019.  Per patient and daughter, colonoscopy per GI MD was noted to be normal and there are no complications during procedure.  Patient said that she will developed left lower quadrant pain which was relieved by bowel movement.  Patient denied nausea or vomiting. Lab work revealed elevated CRP 11.6, procalcitonin 61.66, lactic acid 1.3. Patient was started on ceftriaxone and Flagyl. CT scan of the abdomen pelvis without contrast was unremarkable.   Subjective   Patient seen and examined, denies abdominal pain.  No nausea or vomiting.   Assessment/Plan:     1. Sepsis-likely infectious etiology, CT abdomen pelvis is unremarkable.  She has history of diverticulosis in the past.  Patient has been started on ceftriaxone and Flagyl.  Reviewed notes from [facility].  Patient has history of chronic abdominal pain.  Colonoscopy was unremarkable.  Will consult GI for further recommendations. 2. Acute kidney injury on CKD stage IIIb-patient's baseline creatinine around 1.5.  Patient presented with creatinine of 2.35.  Likely dehydration, poor p.o. intake.  Started on IV normal saline.  Today creatinine is 2.31.  Follow BMP in a.m. 3. Diabetes mellitus type 2-continue sliding scale insulin with NovoLog.  There was concern for possible early DKA, but anion gap is 9.  CO2 is 19.  Patient's hemoglobin A1c is 5.6.  Will discontinue Lantus. 4. Asthma-no exacerbation.  Stable.  Continue albuterol as needed 5. Hypertension-blood pressure is stable.  Continue manidipine.  Will hold HCTZ/lisinopril. 6. Atrial fibrillation-heart rate is controlled, patient is on  Eliquis for anticoagulation.     SpO2: 96 %   COVID-19 Labs  Recent Labs    12/20/19 0113  CRP 11.6*    Lab Results  Component Value Date   SARSCOV2NAA NEGATIVE 12/19/2019     CBG: Recent Labs  Lab 12/20/19 0107 12/20/19 0418 12/20/19 0723 12/20/19 1203  GLUCAP 129* 115* 102* 138*    CBC: Recent Labs  Lab 12/19/19 1413 12/19/19 2149 12/20/19 0008 12/20/19 0429  WBC 22.7*  --  19.0* 17.5*  NEUTROABS  --   --  16.3*  --   HGB 11.1* 11.2* 10.0* 9.4*  HCT 33.4* 33.0* 28.9* 27.1*  MCV 82.5  --  82.6 81.1  PLT 132*  --  122* 106*    Basic Metabolic Panel: Recent Labs  Lab 12/19/19 1413 12/19/19 2149 12/20/19 0008 12/20/19 0429 12/20/19 1141  NA 139 142 138 140 139  K 4.2 4.0 3.9 3.7 3.7  CL 109  --  111 111 111  CO2 15*  --  17* 19* 19*  GLUCOSE 234*  --  147* 117* 142*  BUN 22  --  27* 27* 26*  CREATININE 2.35*  --  2.51* 2.41* 2.31*  CALCIUM 8.9  --  7.9* 8.0* 7.9*     Liver Function Tests: Recent Labs  Lab 12/19/19 1413 12/20/19 0008  AST 34 46*  ALT 27 43  ALKPHOS 60 50  BILITOT 1.6* 1.2  PROT 6.8 6.0*  ALBUMIN 3.5 3.0*        DVT prophylaxis: Eliquis  Code Status: Full code  Family Communication: No family at bedside  Disposition Plan: Patient mated with left lower quadrant pain, concern for infectious  etiology.  Started on IV antibiotics.  Barrier to discharge-ongoing IV antibiotics.  Ongoing investigation for left lower quadrant pain.         Scheduled medications:  . apixaban  2.5 mg Oral BID  . insulin aspart  0-9 Units Subcutaneous Q4H    Consultants:   Procedures:    Antibiotics:   Anti-infectives (From admission, onward)   Start     Dose/Rate Route Frequency Ordered Stop   12/20/19 0030  cefTRIAXone (ROCEPHIN) 2 g in sodium chloride 0.9 % 100 mL IVPB     2 g 200 mL/hr over 30 Minutes Intravenous Daily at bedtime 12/20/19 0008     12/20/19 0015  metroNIDAZOLE (FLAGYL) IVPB 500 mg     500 mg 100  mL/hr over 60 Minutes Intravenous Every 8 hours 12/20/19 0008         Objective   Vitals:   12/20/19 0215 12/20/19 0239 12/20/19 0358 12/20/19 0820  BP: (!) 143/60 (!) 140/56  (!) 122/58  Pulse: (!) 56 (!) 47 (!) 48 (!) 54  Resp: 19 16 18 18   Temp:  98.3 F (36.8 C)  98.8 F (37.1 C)  TempSrc:  Oral  Oral  SpO2: 97% 99% 100% 96%  Weight:  88 kg    Height:        Intake/Output Summary (Last 24 hours) at 12/20/2019 1346 Last data filed at 12/20/2019 0857 Gross per 24 hour  Intake 1840 ml  Output 0 ml  Net 1840 ml    03/18 1901 - 03/20 0700 In: 1600 [P.O.:100] Out: 0   Filed Weights   12/19/19 1406 12/20/19 0239  Weight: 89.4 kg 88 kg    Physical Examination:   General-appears in no acute distress Heart-S1-S2, regular, no murmur auscultated Lungs-clear to auscultation bilaterally, no wheezing or crackles auscultated Abdomen-soft, left lower quadrant tender to palpation, no rigidity or guarding Extremities-no edema in the lower extremities Neuro-alert, oriented x3, no focal deficit noted   Data Reviewed:   Recent Results (from the past 240 hour(s))  SARS CORONAVIRUS 2 (TAT 6-24 HRS) Nasopharyngeal Nasopharyngeal Swab     Status: None   Collection Time: 12/19/19 10:05 PM   Specimen: Nasopharyngeal Swab  Result Value Ref Range Status   SARS Coronavirus 2 NEGATIVE NEGATIVE Final    Comment: (NOTE) SARS-CoV-2 target nucleic acids are NOT DETECTED. The SARS-CoV-2 RNA is generally detectable in upper and lower respiratory specimens during the acute phase of infection. Negative results do not preclude SARS-CoV-2 infection, do not rule out co-infections with other pathogens, and should not be used as the sole basis for treatment or other patient management decisions. Negative results must be combined with clinical observations, patient history, and epidemiological information. The expected result is Negative. Fact Sheet for  Patients: SugarRoll.be Fact Sheet for Healthcare Providers: https://www.woods-mathews.com/ This test is not yet approved or cleared by the [place] FDA and  has been authorized for detection and/or diagnosis of SARS-CoV-2 by FDA under an Emergency Use Authorization (EUA). This EUA will remain  in effect (meaning this test can be used) for the duration of the COVID-19 declaration under Section 56 4(b)(1) of the Act, 21 U.S.C. section 360bbb-3(b)(1), unless the authorization is terminated or revoked sooner. Performed at [facility], [place] [address] [place], [place] [address]     Recent Labs  Lab 12/19/19 1413  LIPASE 26   No results for input(s): AMMONIA in the last 168 hours.  Cardiac Enzymes: No results for input(s): CKTOTAL, CKMB,  CKMBINDEX, TROPONINI in the last 168 hours. BNP (last 3 results) Recent Labs    12/20/19 0429  BNP 272.8*    ProBNP (last 3 results) No results for input(s): PROBNP in the last 8760 hours.  Studies:  CT ABDOMEN PELVIS WO CONTRAST  Result Date: 12/19/2019 CLINICAL DATA:  Left lower quadrant pain EXAM: CT ABDOMEN AND PELVIS WITHOUT CONTRAST TECHNIQUE: Multidetector CT imaging of the abdomen and pelvis was performed following the standard protocol without IV contrast. COMPARISON:  2014 FINDINGS: Lower chest: Cardiomegaly.  Mild bibasilar atelectasis. Hepatobiliary: Cholelithiasis. Too small to characterize hypoattenuating liver lesions are unchanged. Pancreas: Foci of parenchymal calcification likely reflecting sequelae of prior inflammation. Spleen: Unremarkable. Adrenals/Urinary Tract: Adrenals are unremarkable. Kidneys are unremarkable. Bladder is poorly distended. Stomach/Bowel: Small hiatal hernia. Persistent esophageal wall thickening. Bowel is normal in caliber. Normal appendix. Vascular/Lymphatic: Aortic atherosclerosis. No enlarged abdominal or pelvic lymph nodes. Reproductive: Status post  hysterectomy. No adnexal masses. Other: No abdominal wall hernia or abnormality. No abdominopelvic ascites. Musculoskeletal: Advanced degenerative changes at the hips. Multilevel degenerative changes the visualized spine. IMPRESSION: No findings to account for reported symptoms. Stable chronic findings detailed above. Electronically Signed   By: Macy Mis M.D.   On: 12/19/2019 20:19   DG Chest Portable 1 View  Result Date: 12/19/2019 CLINICAL DATA:  Fever EXAM: PORTABLE CHEST 1 VIEW COMPARISON:  05/17/2018 FINDINGS: Cardiomegaly, vascular congestion. Bibasilar atelectasis. Interstitial prominence could reflect early interstitial edema. No effusions or acute bony abnormality. IMPRESSION: Cardiomegaly with vascular congestion and possible early interstitial edema. Bibasilar atelectasis. Electronically Signed   By: Rolm Baptise M.D.   On: 12/19/2019 21:59     Admission status: The appropriate admission status for this patient is INPATIENT. Inpatient status is judged to be reasonable and necessary in order to provide the required intensity of service to ensure the patient's safety. The patient's presenting symptoms, physical exam findings, and initial radiographic and laboratory data in the context of their chronic comorbidities is felt to place them at high risk for further clinical deterioration. Furthermore, it is not anticipated that the patient will be medically stable for discharge from the hospital within 2 midnights of admission. The following factors support the admission status of inpatient.    The patient's presenting symptoms include left lower quadrant pain The worrisome physical exam findings include left lower quadrant tenderness. The initial radiographic and laboratory data are worrisome because of sepsis The chronic co-morbidities include asthma, hypertension, atrial fibrillation.    * I certify that at the point of admission it is my clinical judgment that the patient will require  inpatient hospital care spanning beyond 2 midnights from the point of admission due to high intensity of service, high risk for further deterioration and high frequency of surveillance required.Oswald Hillock   [facility] If 7PM-7AM, please contact night-coverage at [email or web address], Office  [phone]   12/20/2019, 1:46 PM  LOS: 0 days

## 2019-12-21 LAB — CBC
HCT: 26.5 % — ABNORMAL LOW (ref 36.0–46.0)
Hemoglobin: 9.1 g/dL — ABNORMAL LOW (ref 12.0–15.0)
MCH: 27.8 pg (ref 26.0–34.0)
MCHC: 34.3 g/dL (ref 30.0–36.0)
MCV: 81 fL (ref 80.0–100.0)
Platelets: 111 10*3/uL — ABNORMAL LOW (ref 150–400)
RBC: 3.27 MIL/uL — ABNORMAL LOW (ref 3.87–5.11)
RDW: 15.7 % — ABNORMAL HIGH (ref 11.5–15.5)
WBC: 10.7 10*3/uL — ABNORMAL HIGH (ref 4.0–10.5)
nRBC: 0 % (ref 0.0–0.2)

## 2019-12-21 LAB — BASIC METABOLIC PANEL
Anion gap: 12 (ref 5–15)
BUN: 23 mg/dL (ref 8–23)
CO2: 17 mmol/L — ABNORMAL LOW (ref 22–32)
Calcium: 8.3 mg/dL — ABNORMAL LOW (ref 8.9–10.3)
Chloride: 110 mmol/L (ref 98–111)
Creatinine, Ser: 2.02 mg/dL — ABNORMAL HIGH (ref 0.44–1.00)
GFR calc Af Amer: 26 mL/min — ABNORMAL LOW (ref >60)
GFR calc non Af Amer: 23 mL/min — ABNORMAL LOW (ref >60)
Glucose, Bld: 128 mg/dL — ABNORMAL HIGH (ref 70–99)
Potassium: 3.6 mmol/L (ref 3.5–5.1)
Sodium: 139 mmol/L (ref 135–145)

## 2019-12-21 LAB — GLUCOSE, CAPILLARY
Glucose-Capillary: 105 mg/dL — ABNORMAL HIGH (ref 70–99)
Glucose-Capillary: 118 mg/dL — ABNORMAL HIGH (ref 70–99)
Glucose-Capillary: 127 mg/dL — ABNORMAL HIGH (ref 70–99)
Glucose-Capillary: 134 mg/dL — ABNORMAL HIGH (ref 70–99)
Glucose-Capillary: 147 mg/dL — ABNORMAL HIGH (ref 70–99)
Glucose-Capillary: 163 mg/dL — ABNORMAL HIGH (ref 70–99)

## 2019-12-21 LAB — PROCALCITONIN: Procalcitonin: 38.78 ng/mL

## 2019-12-21 MED ORDER — POTASSIUM CHLORIDE CRYS ER 20 MEQ PO TBCR
20.0000 meq | EXTENDED_RELEASE_TABLET | Freq: Once | ORAL | Status: AC
  Administered 2019-12-21: 20 meq via ORAL
  Filled 2019-12-21: qty 1

## 2019-12-21 NOTE — Progress Notes (Addendum)
[facility]  PROGRESS NOTE  [name] QQI:[id number] DOB: 12-Jul-1936 DOA: 12/19/2019 PCP: [name], FNP   Brief HPI:   84 year old female with history of asthma, diabetes mellitus type 2, hypertension, hyperlipidemia, atrial fibrillation on Eliquis, CAD presented with left lower quadrant pain.  Patient has colonoscopy on 12/18/2019.  Per patient and daughter, colonoscopy per GI MD was noted to be normal and there are no complications during procedure.  Patient said that she will developed left lower quadrant pain which was relieved by bowel movement.  Patient denied nausea or vomiting. Lab work revealed elevated CRP 11.6, procalcitonin 61.66, lactic acid 1.3. Patient was started on ceftriaxone and Flagyl. CT scan of the abdomen pelvis without contrast was unremarkable.   Subjective   Patient seen and examined, abdominal pain has improved since yesterday.  WBC down to 10.7.   Assessment/Plan:     1. Sepsis-resolved, likely infectious etiology, CT abdomen pelvis is unremarkable.  She has history of diverticulosis in the past.  Patient has been started on ceftriaxone and Flagyl.  Reviewed notes from [facility].  Patient has history of chronic abdominal pain.  Colonoscopy was unremarkable.  GI was consulted.  Recommended to continue with IV antibiotics for questionable diverticulitis. 2. Acute kidney injury on CKD stage IIIb-patient's baseline creatinine around 1.5.  Patient presented with creatinine of 2.35.  Likely dehydration, poor p.o. intake.  Started on IV normal saline.  Today creatinine is 2.05.  Follow BMP in a.m. 3. Diabetes mellitus type 2-continue sliding scale insulin with NovoLog.  There was concern for possible early DKA, but anion gap is 9.  CO2 is 19.  Patient's hemoglobin A1c is 5.6.  Lantus was discontinued.  CBG well controlled. 4. Asthma-no exacerbation.  Stable.  Continue albuterol as needed 5. Hypertension-blood pressure is stable. Continue  amlodipine. Continue to hold HCTZ/lisinopril. 6. Atrial fibrillation-heart rate is controlled, patient is on Eliquis for anticoagulation.     SpO2: 97 %   COVID-19 Labs  Recent Labs    12/20/19 0113  CRP 11.6*    Lab Results  Component Value Date   SARSCOV2NAA NEGATIVE 12/19/2019     CBG: Recent Labs  Lab 12/20/19 1637 12/20/19 2006 12/21/19 0006 12/21/19 0416 12/21/19 0740  GLUCAP 131* 197* 118* 127* 105*    CBC: Recent Labs  Lab 12/19/19 1413 12/19/19 2149 12/20/19 0008 12/20/19 0429 12/21/19 0417  WBC 22.7*  --  19.0* 17.5* 10.7*  NEUTROABS  --   --  16.3*  --   --   HGB 11.1* 11.2* 10.0* 9.4* 9.1*  HCT 33.4* 33.0* 28.9* 27.1* 26.5*  MCV 82.5  --  82.6 81.1 81.0  PLT 132*  --  122* 106* 111*    Basic Metabolic Panel: Recent Labs  Lab 12/19/19 1413 12/19/19 1413 12/19/19 2149 12/20/19 0008 12/20/19 0429 12/20/19 1141 12/21/19 0417  NA 139   < > 142 138 140 139 139  K 4.2   < > 4.0 3.9 3.7 3.7 3.6  CL 109  --   --  111 111 111 110  CO2 15*  --   --  17* 19* 19* 17*  GLUCOSE 234*  --   --  147* 117* 142* 128*  BUN 22  --   --  27* 27* 26* 23  CREATININE 2.35*  --   --  2.51* 2.41* 2.31* 2.02*  CALCIUM 8.9  --   --  7.9* 8.0* 7.9* 8.3*   < > = values in this interval  not displayed.     Liver Function Tests: Recent Labs  Lab 12/19/19 1413 12/20/19 0008  AST 34 46*  ALT 27 43  ALKPHOS 60 50  BILITOT 1.6* 1.2  PROT 6.8 6.0*  ALBUMIN 3.5 3.0*        DVT prophylaxis: Eliquis  Code Status: Full code  Family Communication: No family at bedside  Disposition Plan: Patient mated with left lower quadrant pain, concern for infectious etiology.  Started on IV antibiotics.  Barrier to discharge-ongoing IV antibiotics.  Ongoing investigation for left lower quadrant pain.       Scheduled medications:  . amLODipine  5 mg Oral Daily  . apixaban  2.5 mg Oral BID  . insulin aspart  0-9 Units Subcutaneous Q4H  . mometasone-formoterol  2  puff Inhalation BID  . montelukast  10 mg Oral QHS  . pravastatin  40 mg Oral Daily     Antibiotics:   Anti-infectives (From admission, onward)   Start     Dose/Rate Route Frequency Ordered Stop   12/20/19 0030  cefTRIAXone (ROCEPHIN) 2 g in sodium chloride 0.9 % 100 mL IVPB     2 g 200 mL/hr over 30 Minutes Intravenous Daily at bedtime 12/20/19 0008     12/20/19 0015  metroNIDAZOLE (FLAGYL) IVPB 500 mg     500 mg 100 mL/hr over 60 Minutes Intravenous Every 8 hours 12/20/19 0008         Objective   Vitals:   12/21/19 0413 12/21/19 0730 12/21/19 0810 12/21/19 1042  BP: (!) 129/57  (!) 139/49   Pulse: (!) 52  (!) 50   Resp: 16     Temp: 99.1 F (37.3 C)     TempSrc: Oral     SpO2: 99% 99% 96% 97%  Weight: 88.7 kg     Height:        Intake/Output Summary (Last 24 hours) at 12/21/2019 1101 Last data filed at 12/20/2019 1901 Gross per 24 hour  Intake 240 ml  Output 700 ml  Net -460 ml    03/19 1901 - 03/21 0700 In: 2080 [P.O.:580] Out: 700 [Urine:700]  Filed Weights   12/19/19 1406 12/20/19 0239 12/21/19 0413  Weight: 89.4 kg 88 kg 88.7 kg    Physical Examination:   General-appears in no acute distress Heart-S1-S2, regular, no murmur auscultated Lungs-clear to auscultation bilaterally, no wheezing or crackles auscultated Abdomen-soft, mild tenderness in left lower quadrant noted, no rigidity or guarding Extremities-no edema in the lower extremities Neuro-alert, oriented x3, no focal deficit noted  Data Reviewed:   Recent Results (from the past 240 hour(s))  SARS CORONAVIRUS 2 (TAT 6-24 HRS) Nasopharyngeal Nasopharyngeal Swab     Status: None   Collection Time: 12/19/19 10:05 PM   Specimen: Nasopharyngeal Swab  Result Value Ref Range Status   SARS Coronavirus 2 NEGATIVE NEGATIVE Final    Comment: (NOTE) SARS-CoV-2 target nucleic acids are NOT DETECTED. The SARS-CoV-2 RNA is generally detectable in upper and lower respiratory specimens during the acute  phase of infection. Negative results do not preclude SARS-CoV-2 infection, do not rule out co-infections with other pathogens, and should not be used as the sole basis for treatment or other patient management decisions. Negative results must be combined with clinical observations, patient history, and epidemiological information. The expected result is Negative. Fact Sheet for Patients: SugarRoll.be Fact Sheet for Healthcare Providers: https://www.woods-mathews.com/ This test is not yet approved or cleared by the [place] FDA and  has been authorized for detection  and/or diagnosis of SARS-CoV-2 by FDA under an Emergency Use Authorization (EUA). This EUA will remain  in effect (meaning this test can be used) for the duration of the COVID-19 declaration under Section 56 4(b)(1) of the Act, 21 U.S.C. section 360bbb-3(b)(1), unless the authorization is terminated or revoked sooner. Performed at [facility], [place] [address] [place], [place] [address]   Blood culture (routine x 2)     Status: None (Preliminary result)   Collection Time: 12/19/19 10:53 PM   Specimen: BLOOD  Result Value Ref Range Status   Specimen Description BLOOD LEFT ANTECUBITAL  Final   Special Requests   Final    BOTTLES DRAWN AEROBIC AND ANAEROBIC Blood Culture results may not be optimal due to an inadequate volume of blood received in culture bottles   Culture   Final    NO GROWTH 1 DAY Performed at [facility], [place] [address] [place], [place] [address]    Report Status PENDING  Incomplete  Blood culture (routine x 2)     Status: None (Preliminary result)   Collection Time: 12/19/19 10:53 PM   Specimen: BLOOD LEFT HAND  Result Value Ref Range Status   Specimen Description BLOOD LEFT HAND  Final   Special Requests   Final    BOTTLES DRAWN AEROBIC AND ANAEROBIC Blood Culture results may not be optimal due to an inadequate volume of blood received in  culture bottles   Culture   Final    NO GROWTH 1 DAY Performed at [facility], [place] [address] [place], [place] [address]    Report Status PENDING  Incomplete    Recent Labs  Lab 12/19/19 1413  LIPASE 26   BNP (last 3 results) Recent Labs    12/20/19 0429  BNP 272.8*     Studies:  CT ABDOMEN PELVIS WO CONTRAST  Result Date: 12/19/2019 CLINICAL DATA:  Left lower quadrant pain EXAM: CT ABDOMEN AND PELVIS WITHOUT CONTRAST TECHNIQUE: Multidetector CT imaging of the abdomen and pelvis was performed following the standard protocol without IV contrast. COMPARISON:  2014 FINDINGS: Lower chest: Cardiomegaly.  Mild bibasilar atelectasis. Hepatobiliary: Cholelithiasis. Too small to characterize hypoattenuating liver lesions are unchanged. Pancreas: Foci of parenchymal calcification likely reflecting sequelae of prior inflammation. Spleen: Unremarkable. Adrenals/Urinary Tract: Adrenals are unremarkable. Kidneys are unremarkable. Bladder is poorly distended. Stomach/Bowel: Small hiatal hernia. Persistent esophageal wall thickening. Bowel is normal in caliber. Normal appendix. Vascular/Lymphatic: Aortic atherosclerosis. No enlarged abdominal or pelvic lymph nodes. Reproductive: Status post hysterectomy. No adnexal masses. Other: No abdominal wall hernia or abnormality. No abdominopelvic ascites. Musculoskeletal: Advanced degenerative changes at the hips. Multilevel degenerative changes the visualized spine. IMPRESSION: No findings to account for reported symptoms. Stable chronic findings detailed above. Electronically Signed   By: Macy Mis M.D.   On: 12/19/2019 20:19   DG Chest Portable 1 View  Result Date: 12/19/2019 CLINICAL DATA:  Fever EXAM: PORTABLE CHEST 1 VIEW COMPARISON:  05/17/2018 FINDINGS: Cardiomegaly, vascular congestion. Bibasilar atelectasis. Interstitial prominence could reflect early interstitial edema. No effusions or acute bony abnormality. IMPRESSION: Cardiomegaly with  vascular congestion and possible early interstitial edema. Bibasilar atelectasis. Electronically Signed   By: Rolm Baptise M.D.   On: 12/19/2019 21:59     Admission status: The appropriate admission status for this patient is INPATIENT. Inpatient status is judged to be reasonable and necessary in order to provide the required intensity of service to ensure the patient's safety. The patient's presenting symptoms, physical exam findings,  and initial radiographic and laboratory data in the context of their chronic comorbidities is felt to place them at high risk for further clinical deterioration. Furthermore, it is not anticipated that the patient will be medically stable for discharge from the hospital within 2 midnights of admission. The following factors support the admission status of inpatient.    The patient's presenting symptoms include left lower quadrant pain The worrisome physical exam findings include left lower quadrant tenderness. The initial radiographic and laboratory data are worrisome because of sepsis The chronic co-morbidities include asthma, hypertension, atrial fibrillation.     I certify that at the point of admission it is my clinical judgment that the patient will require inpatient hospital care spanning beyond 2 midnights from the point of admission due to high intensity of service, high risk for further deterioration and high frequency of surveillance required.   Oswald Hillock   [facility] If 7PM-7AM, please contact night-coverage at [email or web address], Office  [phone]   12/21/2019, 11:01 AM  LOS: 1 day

## 2019-12-21 NOTE — Progress Notes (Signed)
Subjective: Abdominal pain improving.  Objective: Vital signs in last 24 hours: Temp:  [98.7 F (37.1 C)-99.4 F (37.4 C)] 98.7 F (37.1 C) (03/21 1348) Pulse Rate:  [46-53] 53 (03/21 1348) Resp:  [16-20] 20 (03/21 1348) BP: (129-169)/(49-66) 149/62 (03/21 1348) SpO2:  [95 %-99 %] 95 % (03/21 1348) Weight:  [88.7 kg] 88.7 kg (03/21 0413) Weight change: -0.635 kg Last BM Date: 12/19/19  PE: GEN:  NAD ABD:  Soft, mild LLQ tenderness without peritonitis  Lab Results: CBC    Component Value Date/Time   WBC 10.7 (H) 12/21/2019 0417   RBC 3.27 (L) 12/21/2019 0417   HGB 9.1 (L) 12/21/2019 0417   HCT 26.5 (L) 12/21/2019 0417   PLT 111 (L) 12/21/2019 0417   MCV 81.0 12/21/2019 0417   MCH 27.8 12/21/2019 0417   MCHC 34.3 12/21/2019 0417   RDW 15.7 (H) 12/21/2019 0417   LYMPHSABS 1.2 12/20/2019 0008   MONOABS 1.3 (H) 12/20/2019 0008   EOSABS 0.1 12/20/2019 0008   BASOSABS 0.0 12/20/2019 0008   CMP     Component Value Date/Time   NA 139 12/21/2019 0417   NA 140 06/04/2018 1103   K 3.6 12/21/2019 0417   CL 110 12/21/2019 0417   CO2 17 (L) 12/21/2019 0417   GLUCOSE 128 (H) 12/21/2019 0417   BUN 23 12/21/2019 0417   BUN 22 06/04/2018 1103   CREATININE 2.02 (H) 12/21/2019 0417   CREATININE 1.74 (H) 08/28/2016 1042   CALCIUM 8.3 (L) 12/21/2019 0417   CALCIUM 10.2 07/06/2011 1144   PROT 6.0 (L) 12/20/2019 0008   ALBUMIN 3.0 (L) 12/20/2019 0008   AST 46 (H) 12/20/2019 0008   ALT 43 12/20/2019 0008   ALKPHOS 50 12/20/2019 0008   BILITOT 1.2 12/20/2019 0008   GFRNONAA 23 (L) 12/21/2019 0417   GFRAA 26 (L) 12/21/2019 0417   Assessment:  1.  LLQ pain, acute on chronic; worse after colonoscopy, now improving; CT without contrast didn't show perforation or other acute findings.  Suspect chronic IBS-type pain or chronic diverticular-associated spasm pain, with acute contribution most likely from mesenteric stretch during colonoscopy; subtle diverticulitis can't be excluded  either clinically or based on patient's only having non-contrasted CT scan. 2.  Leukocytosis, improving. 3.  Chills.  No recurrent fevers or chills since admission. 4.  Chronic renal insufficiency?  Plan:  1.  Advance diet to full liquids. 2.  Mobilize:  OOBTC, ambulate halls as needed. 3.  IV antibiotics for another day. 4.  If doing better tomorrow, consider advance to soft diet (not advancing further) and consider change to oral antibiotics. 5.  Possible discharge home tomorrow, pending clinical course. 6.  [facility] will follow.   Landry Dyke 12/21/2019, 2:48 PM   Cell [phone] If no answer or after 5 PM call [phone]

## 2019-12-22 LAB — GLUCOSE, CAPILLARY
Glucose-Capillary: 112 mg/dL — ABNORMAL HIGH (ref 70–99)
Glucose-Capillary: 119 mg/dL — ABNORMAL HIGH (ref 70–99)
Glucose-Capillary: 136 mg/dL — ABNORMAL HIGH (ref 70–99)
Glucose-Capillary: 148 mg/dL — ABNORMAL HIGH (ref 70–99)
Glucose-Capillary: 155 mg/dL — ABNORMAL HIGH (ref 70–99)

## 2019-12-22 LAB — PROCALCITONIN: Procalcitonin: 21.34 ng/mL

## 2019-12-22 MED ORDER — AMOXICILLIN-POT CLAVULANATE 875-125 MG PO TABS: 1.0000 | ORAL_TABLET | Freq: Two times a day (BID) | ORAL | 0 refills | Status: AC

## 2019-12-22 NOTE — Plan of Care (Signed)

## 2019-12-22 NOTE — Plan of Care (Signed)

## 2019-12-22 NOTE — Progress Notes (Signed)
Discharge instructions given to patient I removedHer PIV with cath intact applied dry dressing to previous site and I also removed her tele. CCMD was notifed of discharge. She was instructed to call for her ride.

## 2019-12-22 NOTE — Discharge Summary (Signed)
Physician Discharge Summary  [name] CNO:[id number] DOB: 09/17/1936 DOA: 12/19/2019  PCP: [name], FNP  Admit date: 12/19/2019 Discharge date: 12/22/2019  Time spent: 50* minutes  Recommendations for Outpatient Follow-up:  1. Follow-up PCP in 2 weeks 2. Follow-up gastroenterology as outpatient   Discharge Diagnoses:  Active Problems:   DKA (diabetic ketoacidoses) [facility])   Discharge Condition: Stable  Diet recommendation: Heart healthy diet  Filed Weights   12/20/19 0239 12/21/19 0413 12/22/19 0422  Weight: 88 kg 88.7 kg 89.6 kg    History of present illness:  84 year old female with history of asthma, diabetes mellitus type 2, hypertension, hyperlipidemia, atrial fibrillation on Eliquis, CAD presented with left lower quadrant pain.  Patient has colonoscopy on 12/18/2019.  Per patient and daughter, colonoscopy per GI MD was noted to be normal and there are no complications during procedure.  Patient said that she will developed left lower quadrant pain which was relieved by bowel movement.  Patient denied nausea or vomiting. Lab work revealed elevated CRP 11.6, procalcitonin 61.66, lactic acid 1.3. Patient was started on ceftriaxone and Flagyl. CT scan of the abdomen pelvis without contrast was unremarkable.  Hospital Course:   1. Sepsis-resolved, likely infectious etiology, CT abdomen pelvis is unremarkable.  She has history of diverticulosis in the past.  Patient has been started on ceftriaxone and Flagyl.  Reviewed [facility] from [facility].  Patient has history of chronic abdominal pain.  Colonoscopy was unremarkable.  GI was consulted.  Recommended to continue with IV antibiotics for questionable diverticulitis.  At this time patient has improved.  Will discharge home on Augmentin 1 tablet p.o. twice daily for 7 more days. 2. Acute kidney injury on CKD stage IIIb-patient's baseline creatinine around 1.5.  Patient presented with creatinine of 2.35.  Likely  dehydration, poor p.o. intake.  Started on IV normal saline.  Today creatinine is 2.02.  Patient takes lisinopril/HCTZ at home. 3. Diabetes mellitus type 2-continue sliding scale insulin with NovoLog.  There was concern for possible early DKA, but anion gap is 9.  CO2 is 19.  Patient's hemoglobin A1c is 5.6.    Continue home medications. 4. Asthma-no exacerbation.  Stable.  Continue albuterol as needed 5. Hypertension-blood pressure is stable. Continue amlodipine. Continue  HCTZ/lisinopril. 6. Atrial fibrillation-heart rate is controlled, patient is on Eliquis for anticoagulation  Procedures:  None  Consultations:  Gastroenterology  Discharge Exam: Vitals:   12/22/19 0753 12/22/19 1141  BP: (!) 145/66 (!) 151/72  Pulse: 65 (!) 49  Resp: 20 18  Temp: 99 F (37.2 C) 98.5 F (36.9 C)  SpO2: 100% 96%    General: Appears in no acute distress Cardiovascular: S1-S2, regular Respiratory: Clear to auscultation bilaterally  Discharge Instructions   Discharge Instructions    Diet - low sodium heart healthy   Complete by: As directed    Increase activity slowly   Complete by: As directed      Allergies as of 12/22/2019      Reactions   Aspirin Shortness Of Breath, Other (See Comments)   Caused asthma symptoms   Ramipril Cough      Medication List    TAKE these medications   acetaminophen 325 MG tablet Commonly known as: TYLENOL Take 650 mg by mouth every 6 (six) hours as needed for pain.   albuterol 108 (90 Base) MCG/ACT inhaler Commonly known as: ProAir HFA Inhale 2 puffs into the lungs every 4 (four) hours as needed for wheezing.   amLODipine 5 MG tablet  Commonly known as: NORVASC TAKE 1 TABLET BY MOUTH TWICE A DAY What changed: when to take this   amoxicillin-clavulanate 875-125 MG tablet Commonly known as: Augmentin Take 1 tablet by mouth 2 (two) times daily for 7 days.   ascorbic acid 500 MG tablet Commonly known as: VITAMIN C Take 500-1,000 mg by mouth  daily.   BD Insulin Syringe U/F 31G X 5/16" 0.3 ML Misc Generic drug: Insulin Syringe-Needle U-100 Use as directed   budesonide-formoterol 160-4.5 MCG/ACT inhaler Commonly known as: Symbicort Take 2 puffs first thing in am and then another 2 puffs about 12 hours later. What changed:   how much to take  how to take this  when to take this  additional instructions   Eliquis 2.5 MG Tabs tablet Generic drug: apixaban TAKE 1 TABLET BY MOUTH TWICE A DAY What changed: how much to take   insulin lispro 100 UNIT/ML injection Commonly known as: HumaLOG 3 units with breakfast, and 22 units with supper What changed:   how much to take  how to take this  when to take this  additional instructions   losartan-hydrochlorothiazide 100-25 MG tablet Commonly known as: HYZAAR TAKE 1 TABLET BY MOUTH EVERY DAY   montelukast 10 MG tablet Commonly known as: SINGULAIR TAKE 1 TABLET BY MOUTH AT BEDTIME What changed:   how much to take  how to take this  when to take this  additional instructions   omeprazole 40 MG capsule Commonly known as: PRILOSEC TAKE 1 CAPSULE BY MOUTH EVERY DAY What changed:   how much to take  when to take this   ONE TOUCH LANCETS Misc Use as directed three times daily   ONE TOUCH ULTRA 2 w/Device Kit Check blood sugar 3 times per day.   potassium chloride 10 MEQ tablet Commonly known as: KLOR-CON TAKE 1 TABLET BY MOUTH EVERY DAY   pravastatin 40 MG tablet Commonly known as: PRAVACHOL Take 1 tablet (40 mg total) by mouth daily.   Vitamin D (Ergocalciferol) 1.25 MG (50000 UNIT) Caps capsule Commonly known as: DRISDOL TAKE 1 CAPSULE (50,000 UNITS TOTAL) BY MOUTH EVERY 7 (SEVEN) DAYS FOR 12 DOSES.      Allergies  Allergen Reactions  . Aspirin Shortness Of Breath and Other (See Comments)    Caused asthma symptoms  . Ramipril Cough   Follow-up Information    Richmond Campbell, MD.   Specialty: Gastroenterology Contact  information: [place] [address] [phone]            The results of significant diagnostics from this hospitalization (including imaging, microbiology, ancillary and laboratory) are listed below for reference.    Significant Diagnostic Studies: CT ABDOMEN PELVIS WO CONTRAST  Result Date: 12/19/2019 CLINICAL DATA:  Left lower quadrant pain EXAM: CT ABDOMEN AND PELVIS WITHOUT CONTRAST TECHNIQUE: Multidetector CT imaging of the abdomen and pelvis was performed following the standard protocol without IV contrast. COMPARISON:  2014 FINDINGS: Lower chest: Cardiomegaly.  Mild bibasilar atelectasis. Hepatobiliary: Cholelithiasis. Too small to characterize hypoattenuating liver lesions are unchanged. Pancreas: Foci of parenchymal calcification likely reflecting sequelae of prior inflammation. Spleen: Unremarkable. Adrenals/Urinary Tract: Adrenals are unremarkable. Kidneys are unremarkable. Bladder is poorly distended. Stomach/Bowel: Small hiatal hernia. Persistent esophageal wall thickening. Bowel is normal in caliber. Normal appendix. Vascular/Lymphatic: Aortic atherosclerosis. No enlarged abdominal or pelvic lymph nodes. Reproductive: Status post hysterectomy. No adnexal masses. Other: No abdominal wall hernia or abnormality. No abdominopelvic ascites. Musculoskeletal: Advanced degenerative changes at the hips. Multilevel degenerative changes  the visualized spine. IMPRESSION: No findings to account for reported symptoms. Stable chronic findings detailed above. Electronically Signed   By: Macy Mis M.D.   On: 12/19/2019 20:19   DG Chest Portable 1 View  Result Date: 12/19/2019 CLINICAL DATA:  Fever EXAM: PORTABLE CHEST 1 VIEW COMPARISON:  05/17/2018 FINDINGS: Cardiomegaly, vascular congestion. Bibasilar atelectasis. Interstitial prominence could reflect early interstitial edema. No effusions or acute bony abnormality. IMPRESSION: Cardiomegaly with vascular congestion and  possible early interstitial edema. Bibasilar atelectasis. Electronically Signed   By: Rolm Baptise M.D.   On: 12/19/2019 21:59    Microbiology: Recent Results (from the past 240 hour(s))  SARS CORONAVIRUS 2 (TAT 6-24 HRS) Nasopharyngeal Nasopharyngeal Swab     Status: None   Collection Time: 12/19/19 10:05 PM   Specimen: Nasopharyngeal Swab  Result Value Ref Range Status   SARS Coronavirus 2 NEGATIVE NEGATIVE Final    Comment: (NOTE) SARS-CoV-2 target nucleic acids are NOT DETECTED. The SARS-CoV-2 RNA is generally detectable in upper and lower respiratory specimens during the acute phase of infection. Negative results do not preclude SARS-CoV-2 infection, do not rule out co-infections with other pathogens, and should not be used as the sole basis for treatment or other patient management decisions. Negative results must be combined with clinical observations, patient history, and epidemiological information. The expected result is Negative. Fact Sheet for Patients: SugarRoll.be Fact Sheet for Healthcare Providers: https://www.woods-mathews.com/ This test is not yet approved or cleared by the [place] FDA and  has been authorized for detection and/or diagnosis of SARS-CoV-2 by FDA under an Emergency Use Authorization (EUA). This EUA will remain  in effect (meaning this test can be used) for the duration of the COVID-19 declaration under Section 56 4(b)(1) of the Act, 21 U.S.C. section 360bbb-3(b)(1), unless the authorization is terminated or revoked sooner. Performed at [facility], [place] [address] [place], [place] [address]   Blood culture (routine x 2)     Status: None (Preliminary result)   Collection Time: 12/19/19 10:53 PM   Specimen: BLOOD  Result Value Ref Range Status   Specimen Description BLOOD LEFT ANTECUBITAL  Final   Special Requests   Final    BOTTLES DRAWN AEROBIC AND ANAEROBIC Blood Culture results may not be  optimal due to an inadequate volume of blood received in culture bottles   Culture   Final    NO GROWTH 2 DAYS Performed at [facility], [place] [address], [place], [place] [address]    Report Status PENDING  Incomplete  Blood culture (routine x 2)     Status: None (Preliminary result)   Collection Time: 12/19/19 10:53 PM   Specimen: BLOOD LEFT HAND  Result Value Ref Range Status   Specimen Description BLOOD LEFT HAND  Final   Special Requests   Final    BOTTLES DRAWN AEROBIC AND ANAEROBIC Blood Culture results may not be optimal due to an inadequate volume of blood received in culture bottles   Culture   Final    NO GROWTH 2 DAYS Performed at [facility], [place] [address], [place], [place] [address]    Report Status PENDING  Incomplete     Labs: Basic Metabolic Panel: Recent Labs  Lab 12/19/19 1413 12/19/19 1413 12/19/19 2149 12/20/19 0008 12/20/19 0429 12/20/19 1141 12/21/19 0417  NA 139   < > 142 138 140 139 139  K 4.2   < > 4.0 3.9 3.7 3.7 3.6  CL 109  --   --  111 111 111 110  CO2 15*  --   --  17* 19* 19* 17*  GLUCOSE 234*  --   --  147* 117* 142* 128*  BUN 22  --   --  27* 27* 26* 23  CREATININE 2.35*  --   --  2.51* 2.41* 2.31* 2.02*  CALCIUM 8.9  --   --  7.9* 8.0* 7.9* 8.3*   < > = values in this interval not displayed.   Liver Function Tests: Recent Labs  Lab 12/19/19 1413 12/20/19 0008  AST 34 46*  ALT 27 43  ALKPHOS 60 50  BILITOT 1.6* 1.2  PROT 6.8 6.0*  ALBUMIN 3.5 3.0*   Recent Labs  Lab 12/19/19 1413  LIPASE 26   No results for input(s): AMMONIA in the last 168 hours. CBC: Recent Labs  Lab 12/19/19 1413 12/19/19 2149 12/20/19 0008 12/20/19 0429 12/21/19 0417  WBC 22.7*  --  19.0* 17.5* 10.7*  NEUTROABS  --   --  16.3*  --   --   HGB 11.1* 11.2* 10.0* 9.4* 9.1*  HCT 33.4* 33.0* 28.9* 27.1* 26.5*  MCV 82.5  --  82.6 81.1 81.0  PLT 132*  --  122* 106* 111*   Cardiac Enzymes: No results for input(s): CKTOTAL, CKMB,  CKMBINDEX, TROPONINI in the last 168 hours. BNP: BNP (last 3 results) Recent Labs    12/20/19 0429  BNP 272.8*    ProBNP (last 3 results) No results for input(s): PROBNP in the last 8760 hours.  CBG: Recent Labs  Lab 12/22/19 0009 12/22/19 0419 12/22/19 0729 12/22/19 0806 12/22/19 1056  GLUCAP 112* 119* 148* 136* 155*       Signed:  Oswald Hillock MD.  [facility] 12/22/2019, 1:35 PM

## 2019-12-22 NOTE — Plan of Care (Signed)
  Problem: Education: Goal: Knowledge of General Education information will improve Description: Including pain rating scale, medication(s)/side effects and non-pharmacologic comfort measures 12/22/2019 1450 by Arlina Robes, RN Outcome: Adequate for Discharge 12/22/2019 0955 by Arlina Robes, RN Outcome: Progressing   Problem: Health Behavior/Discharge Planning: Goal: Ability to manage health-related needs will improve 12/22/2019 1450 by Arlina Robes, RN Outcome: Adequate for Discharge 12/22/2019 0955 by Arlina Robes, RN Outcome: Progressing   Problem: Clinical Measurements: Goal: Ability to maintain clinical measurements within normal limits will improve 12/22/2019 1450 by Arlina Robes, RN Outcome: Adequate for Discharge 12/22/2019 0955 by Arlina Robes, RN Outcome: Progressing Goal: Will remain free from infection 12/22/2019 1450 by Arlina Robes, RN Outcome: Adequate for Discharge 12/22/2019 0955 by Arlina Robes, RN Outcome: Progressing Goal: Diagnostic test results will improve 12/22/2019 1450 by Arlina Robes, RN Outcome: Adequate for Discharge 12/22/2019 0955 by Arlina Robes, RN Outcome: Progressing Goal: Respiratory complications will improve 12/22/2019 1450 by Arlina Robes, RN Outcome: Adequate for Discharge 12/22/2019 0955 by Arlina Robes, RN Outcome: Progressing Goal: Cardiovascular complication will be avoided 12/22/2019 1450 by Arlina Robes, RN Outcome: Adequate for Discharge 12/22/2019 0955 by Arlina Robes, RN Outcome: Progressing   Problem: Activity: Goal: Risk for activity intolerance will decrease 12/22/2019 1450 by Arlina Robes, RN Outcome: Adequate for Discharge 12/22/2019 0955 by Arlina Robes, RN Outcome: Progressing   Problem: Nutrition: Goal: Adequate nutrition will be maintained 12/22/2019 1450 by Arlina Robes, RN Outcome: Adequate for Discharge 12/22/2019 0955 by  Arlina Robes, RN Outcome: Progressing   Problem: Coping: Goal: Level of anxiety will decrease 12/22/2019 1450 by Arlina Robes, RN Outcome: Adequate for Discharge 12/22/2019 0955 by Arlina Robes, RN Outcome: Progressing   Problem: Elimination: Goal: Will not experience complications related to bowel motility 12/22/2019 1450 by Arlina Robes, RN Outcome: Adequate for Discharge 12/22/2019 0955 by Arlina Robes, RN Outcome: Progressing Goal: Will not experience complications related to urinary retention 12/22/2019 1450 by Arlina Robes, RN Outcome: Adequate for Discharge 12/22/2019 0955 by Arlina Robes, RN Outcome: Progressing   Problem: Pain Managment: Goal: General experience of comfort will improve 12/22/2019 1450 by Arlina Robes, RN Outcome: Adequate for Discharge 12/22/2019 0955 by Arlina Robes, RN Outcome: Progressing   Problem: Safety: Goal: Ability to remain free from injury will improve 12/22/2019 1450 by Arlina Robes, RN Outcome: Adequate for Discharge 12/22/2019 0955 by Arlina Robes, RN Outcome: Progressing   Problem: Skin Integrity: Goal: Risk for impaired skin integrity will decrease 12/22/2019 1450 by Arlina Robes, RN Outcome: Adequate for Discharge 12/22/2019 [place] by Arlina Robes, RN Outcome: Progressing

## 2019-12-22 NOTE — Progress Notes (Signed)
Subjective: LLQ pain much better. Tolerating diet.  Objective: Vital signs in last 24 hours: Temp:  [98.5 F (36.9 C)-99.5 F (37.5 C)] 98.5 F (36.9 C) (03/22 1141) Pulse Rate:  [49-65] 49 (03/22 1141) Resp:  [18-20] 18 (03/22 1141) BP: (145-156)/(58-72) 151/72 (03/22 1141) SpO2:  [95 %-100 %] 96 % (03/22 1141) Weight:  [89.6 kg] 89.6 kg (03/22 0422) Weight change: 0.907 kg Last BM Date: 12/19/19  PE: GEN: NAD, overweight  Lab Results: CBC    Component Value Date/Time   WBC 10.7 (H) 12/21/2019 0417   RBC 3.27 (L) 12/21/2019 0417   HGB 9.1 (L) 12/21/2019 0417   HCT 26.5 (L) 12/21/2019 0417   PLT 111 (L) 12/21/2019 0417   MCV 81.0 12/21/2019 0417   MCH 27.8 12/21/2019 0417   MCHC 34.3 12/21/2019 0417   RDW 15.7 (H) 12/21/2019 0417   LYMPHSABS 1.2 12/20/2019 0008   MONOABS 1.3 (H) 12/20/2019 0008   EOSABS 0.1 12/20/2019 0008   BASOSABS 0.0 12/20/2019 0008   CMP     Component Value Date/Time   NA 139 12/21/2019 0417   NA 140 06/04/2018 1103   K 3.6 12/21/2019 0417   CL 110 12/21/2019 0417   CO2 17 (L) 12/21/2019 0417   GLUCOSE 128 (H) 12/21/2019 0417   BUN 23 12/21/2019 0417   BUN 22 06/04/2018 1103   CREATININE 2.02 (H) 12/21/2019 0417   CREATININE 1.74 (H) 08/28/2016 1042   CALCIUM 8.3 (L) 12/21/2019 0417   CALCIUM 10.2 07/06/2011 1144   PROT 6.0 (L) 12/20/2019 0008   ALBUMIN 3.0 (L) 12/20/2019 0008   AST 46 (H) 12/20/2019 0008   ALT 43 12/20/2019 0008   ALKPHOS 50 12/20/2019 0008   BILITOT 1.2 12/20/2019 0008   GFRNONAA 23 (L) 12/21/2019 0417   GFRAA 26 (L) 12/21/2019 0417   Assessment:  1. LLQ pain, acute on chronic; worse after colonoscopy, now improving; CT without contrast didn't show perforation or other acute findings. Suspect chronic IBS-type pain or chronic diverticular-associated spasm pain, with acute contribution most likely from mesenteric stretch during colonoscopy; subtle diverticulitis can't be excluded either clinically or based on  patient's only having non-contrasted CT scan. 2. Leukocytosis, improving. 3. Chills.  No recurrent fevers or chills since admission. 4. Chronic renal insufficiency?  Plan:  1.  OK to discharge home today from GI perspective. 2.  Would administer ten-day total course of antibiotics (Cipro + Flagyl vs Augmentin). 3.  Patient can follow up with Dr. Earlean Shawl as outpatient. 4.  [facility] will sign-off; please call with questions; thank you for the consultation.   [name] 12/22/2019, 12:43 PM   Cell [phone] If no answer or after 5 PM call [phone]

## 2019-12-24 ENCOUNTER — Encounter: Payer: Self-pay | Admitting: Family

## 2019-12-24 ENCOUNTER — Other Ambulatory Visit: Payer: Self-pay

## 2019-12-24 ENCOUNTER — Ambulatory Visit (INDEPENDENT_AMBULATORY_CARE_PROVIDER_SITE_OTHER): Payer: Medicare Other | Admitting: Family

## 2019-12-24 VITALS — BP 140/70 | HR 60 | Temp 98.1°F | Ht 67.0 in | Wt 199.0 lb

## 2019-12-24 DIAGNOSIS — R1032 Left lower quadrant pain: Secondary | ICD-10-CM

## 2019-12-24 DIAGNOSIS — R600 Localized edema: Secondary | ICD-10-CM | POA: Diagnosis not present

## 2019-12-24 DIAGNOSIS — E559 Vitamin D deficiency, unspecified: Secondary | ICD-10-CM | POA: Diagnosis not present

## 2019-12-24 LAB — CBC WITH DIFFERENTIAL/PLATELET
Basophils Absolute: 0.1 10*3/uL (ref 0.0–0.1)
Basophils Relative: 0.9 % (ref 0.0–3.0)
Eosinophils Absolute: 0.3 10*3/uL (ref 0.0–0.7)
Eosinophils Relative: 3.3 % (ref 0.0–5.0)
HCT: 29.5 % — ABNORMAL LOW (ref 36.0–46.0)
Hemoglobin: 10.1 g/dL — ABNORMAL LOW (ref 12.0–15.0)
Lymphocytes Relative: 12.7 % (ref 12.0–46.0)
Lymphs Abs: 1.4 10*3/uL (ref 0.7–4.0)
MCHC: 33.6 g/dL (ref 30.0–36.0)
MCV: 83.4 fl (ref 78.0–100.0)
Monocytes Absolute: 1.1 10*3/uL — ABNORMAL HIGH (ref 0.1–1.0)
Monocytes Relative: 10.7 % (ref 3.0–12.0)
Neutro Abs: 7.7 10*3/uL (ref 1.4–7.7)
Neutrophils Relative %: 72.4 % (ref 43.0–77.0)
Platelets: 152 10*3/uL (ref 150.0–400.0)
RBC: 3.54 Mil/uL — ABNORMAL LOW (ref 3.87–5.11)
RDW: 16.5 % — ABNORMAL HIGH (ref 11.5–15.5)
WBC: 10.7 10*3/uL — ABNORMAL HIGH (ref 4.0–10.5)

## 2019-12-24 LAB — COMPREHENSIVE METABOLIC PANEL
ALT: 20 U/L (ref 0–35)
AST: 24 U/L (ref 0–37)
Albumin: 3.7 g/dL (ref 3.5–5.2)
Alkaline Phosphatase: 64 U/L (ref 39–117)
BUN: 14 mg/dL (ref 6–23)
CO2: 18 mEq/L — ABNORMAL LOW (ref 19–32)
Calcium: 9.1 mg/dL (ref 8.4–10.5)
Chloride: 108 mEq/L (ref 96–112)
Creatinine, Ser: 1.68 mg/dL — ABNORMAL HIGH (ref 0.40–1.20)
GFR: 35.33 mL/min — ABNORMAL LOW (ref >60.00)
Glucose, Bld: 123 mg/dL — ABNORMAL HIGH (ref 70–99)
Potassium: 4.1 mEq/L (ref 3.5–5.1)
Sodium: 138 mEq/L (ref 135–145)
Total Bilirubin: 1.3 mg/dL — ABNORMAL HIGH (ref 0.2–1.2)
Total Protein: 7 g/dL (ref 6.0–8.3)

## 2019-12-24 LAB — VITAMIN D 25 HYDROXY (VIT D DEFICIENCY, FRACTURES): VITD: 29.4 ng/mL — ABNORMAL LOW (ref 30.00–100.00)

## 2019-12-24 NOTE — Progress Notes (Signed)
[name] is a 84 y.o. female with the following history as recorded in EpicCare:  Patient Active Problem List   Diagnosis Date Noted  . DKA (diabetic ketoacidoses) ([place]) 12/20/2019  . DOE (dyspnea on exertion) 01/30/2018  . Acute bronchiolitis 12/17/2017  . Wheezing 10/29/2017  . Vitamin D deficiency 07/31/2017  . Renal insufficiency 07/31/2017  . Knee pain 06/01/2017  . Morbid obesity due to excess calories ([place]) 01/25/2017  . Diabetes ([place]) 04/12/2016  . Cough 01/20/2015  . Atrial fibrillation ([place]) 05/28/2014  . Personal history of colonic polyps 05/28/2014  . Vomiting 01/29/2014  . CAD (coronary artery disease) 09/01/2013  . Routine general medical examination at a health care facility 07/17/2011  . Encounter for long-term (current) use of other medications 07/06/2011  . Nonspecific (abnormal) findings on radiological and other examination of body structure 11/09/2009  . IRRITABLE BOWEL SYNDROME 05/07/2009  . CHEST PAIN 05/07/2009  . ADNEXAL MASS, RIGHT 07/22/2008  . HEMORRHOIDS, RECURRENT 01/27/2008  . Diverticulitis 01/27/2008  . OTH ABNORMAL FIND RAD EXAMINATION BREAST 01/27/2008  . GERD 01/13/2008  . UTI 09/28/2007  . Dyslipidemia 05/27/2007  . ANEMIA-IRON DEFICIENCY 05/27/2007  . ANXIETY 05/27/2007  . Essential hypertension 05/27/2007  . Seasonal allergic rhinitis 05/27/2007  . Cough variant asthma 05/27/2007  . Osteoporosis 05/27/2007  . DIVERTICULITIS, HX OF 05/27/2007    Current Outpatient Medications  Medication Sig Dispense Refill  . acetaminophen (TYLENOL) 325 MG tablet Take 650 mg by mouth every 6 (six) hours as needed for pain.    Marland Kitchen albuterol (PROAIR HFA) 108 (90 Base) MCG/ACT inhaler Inhale 2 puffs into the lungs every 4 (four) hours as needed for wheezing. 18 g 11  . amLODipine (NORVASC) 5 MG tablet TAKE 1 TABLET BY MOUTH TWICE A DAY (Patient taking differently: Take 5 mg by mouth in the morning and at bedtime. ) 180 tablet 2  .  amoxicillin-clavulanate (AUGMENTIN) 875-125 MG tablet Take 1 tablet by mouth 2 (two) times daily for 7 days. 14 tablet 0  . ascorbic acid (VITAMIN C) 500 MG tablet Take 500-1,000 mg by mouth daily.    . Blood Glucose Monitoring Suppl (ONE TOUCH ULTRA 2) W/DEVICE KIT Check blood sugar 3 times per day. 1 each 0  . budesonide-formoterol (SYMBICORT) 160-4.5 MCG/ACT inhaler Take 2 puffs first thing in am and then another 2 puffs about 12 hours later. (Patient taking differently: Inhale 2 puffs into the lungs every 12 (twelve) hours. ) 10.2 Inhaler 11  . ELIQUIS 2.5 MG TABS tablet TAKE 1 TABLET BY MOUTH TWICE A DAY (Patient taking differently: Take 2.5 mg by mouth 2 (two) times daily. ) 180 tablet 2  . insulin lispro (HUMALOG) 100 UNIT/ML injection 3 units with breakfast, and 22 units with supper (Patient taking differently: Inject 5-25 Units into the skin See admin instructions. Inject 5 units into the skin in the morning before breakfast and 25 units before supper) 20 mL 6  . Insulin Syringe-Needle U-100 (BD INSULIN SYRINGE ULTRAFINE) 31G X 5/16" 0.3 ML MISC Use as directed     . losartan-hydrochlorothiazide (HYZAAR) 100-25 MG tablet TAKE 1 TABLET BY MOUTH EVERY DAY (Patient taking differently: Take 1 tablet by mouth daily. ) 90 tablet 3  . montelukast (SINGULAIR) 10 MG tablet TAKE 1 TABLET BY MOUTH AT BEDTIME (Patient taking differently: Take 10 mg by mouth at bedtime. ) 90 tablet 5  . omeprazole (PRILOSEC) 40 MG capsule TAKE 1 CAPSULE BY MOUTH EVERY DAY (Patient taking differently: Take 40 mg by  mouth daily before breakfast. ) 90 capsule 1  . ONE TOUCH LANCETS MISC Use as directed three times daily     . potassium chloride (KLOR-CON) 10 MEQ tablet TAKE 1 TABLET BY MOUTH EVERY DAY (Patient taking differently: Take 10 mEq by mouth daily. ) 90 tablet 2  . pravastatin (PRAVACHOL) 40 MG tablet Take 1 tablet (40 mg total) by mouth daily. 90 tablet 3  . Vitamin D, Ergocalciferol, (DRISDOL) 1.25 MG (50000 UNIT)  CAPS capsule TAKE 1 CAPSULE (50,000 UNITS TOTAL) BY MOUTH EVERY 7 (SEVEN) DAYS FOR 12 DOSES. 12 capsule 0   No current facility-administered medications for this visit.    Allergies: Aspirin and Ramipril  Past Medical History:  Diagnosis Date  . Allergic rhinitis   . Anterior chest wall pain   . Anxiety   . Asthma   . Cough   . DM type 2 (diabetes mellitus, type 2) ([place])   . GERD (gastroesophageal reflux disease)   . History of diverticulitis of colon   . HTN (hypertension)   . Hyperlipidemia   . IBS (irritable bowel syndrome)   . Iron deficiency anemia   . Osteoporosis, unspecified   . Renal insufficiency 07/31/2017  . UTI (urinary tract infection)     Past Surgical History:  Procedure Laterality Date  . CARDIOVASCULAR STRESS TEST  02/25/04  . ESOPHAGOGASTRODUODENOSCOPY  12/26/01  . RIGHT OOPHORECTOMY  jan 2010    Family History  Problem Relation Age of Onset  . Lung cancer Brother   . Melanoma Brother   . Hypertension Mother   . Stroke Mother   . Other Father        poor circulation  . Atopy Neg Hx   . Asthma Neg Hx   . Breast cancer Neg Hx     Social History   Tobacco Use  . Smoking status: Former Smoker    Packs/day: 0.30    Years: 5.00    Pack years: 1.50    Types: Cigarettes    Quit date: 10/02/1986    Years since quitting: 33.2  . Smokeless tobacco: Never Used  Substance Use Topics  . Alcohol use: No    Subjective:   Hospital follow-up; patient had colonoscopy on 12/18/2019; developed LLQ pain in the evening after the colonoscopy; was admitted to the hospital with suspect diverticulitis/ suspected sepsis; also found to have AKI- suspect secondary to dehydration; At time of discharge, she was to continue Augmentin for treatment of diverticulitis; was told to follow-up with her GI as well.   Also mentions increased swelling in her left lower leg x 1-2 weeks; had to stop Eliquis for a few days in preparation for her colonoscopy on 3/18; no cough or shortness  of breath; is scheduled to see her cardiologist in follow-up next week; is back on Eliquis;   Objective:  Vitals:   12/24/19 1123  BP: 140/70  Pulse: 60  Temp: 98.1 F (36.7 C)  TempSrc: Oral  SpO2: 97%  Weight: 199 lb (90.3 kg)  Height: _0  (1.702 m)    General: Well developed, well nourished, in no acute distress  Skin : Warm and dry.  Head: Normocephalic and atraumatic  Eyes: Sclera and conjunctiva clear; pupils round and reactive to light; extraocular movements intact  Lungs: Respirations unlabored; clear to auscultation bilaterally without wheeze, rales, rhonchi  CVS exam: normal rate and regular rhythm.  Musculoskeletal: No deformities; no active joint inflammation  Extremities: No edema, cyanosis, clubbing  Vessels: Symmetric bilaterally  Neurologic:  Alert and oriented; speech intact; face symmetrical; moves all extremities well; CNII-XII intact without focal deficit   Assessment:  1. Left lower quadrant abdominal pain   2. Pedal edema   3. Vitamin D deficiency     Plan:  1. Diverticulitis improving; finish Augmentin as prescribed; keep scheduled follow up with GI; check CBC, CMP today; 2. Check venous doppler; continue Eliquis- keep planned follow up with cardiology; 3. Check Vitamin D level;  This visit occurred during the SARS-CoV-2 public health emergency.  Safety protocols were in place, including screening questions prior to the visit, additional usage of staff PPE, and extensive cleaning of exam room while observing appropriate contact time as indicated for disinfecting solutions.      No follow-ups on file.  Orders Placed This Encounter  Procedures  . CBC with Differential/Platelet  . Comp Met (CMET)  . D-Dimer, Quantitative  . VITAMIN D 25 Hydroxy (Vit-D Deficiency, Fractures)    Requested Prescriptions    No prescriptions requested or ordered in this encounter

## 2019-12-25 LAB — CULTURE, BLOOD (ROUTINE X 2)
Culture: NO GROWTH
Culture: NO GROWTH

## 2019-12-25 LAB — D-DIMER, QUANTITATIVE: D-Dimer, Quant: 2.15 mcg/mL FEU — ABNORMAL HIGH (ref <0.50)

## 2019-12-26 ENCOUNTER — Ambulatory Visit (HOSPITAL_COMMUNITY)
Admission: RE | Admit: 2019-12-26 | Discharge: 2019-12-26 | Disposition: A | Discharge status: Home / Self Care | Payer: Medicare Other | Source: Ambulatory Visit | Attending: Internal Medicine | Admitting: Internal Medicine

## 2019-12-26 ENCOUNTER — Other Ambulatory Visit: Payer: Self-pay | Admitting: Family

## 2019-12-26 ENCOUNTER — Other Ambulatory Visit: Payer: Self-pay

## 2019-12-26 DIAGNOSIS — D509 Iron deficiency anemia, unspecified: Secondary | ICD-10-CM

## 2019-12-26 DIAGNOSIS — R600 Localized edema: Secondary | ICD-10-CM | POA: Diagnosis not present

## 2019-12-26 DIAGNOSIS — R079 Chest pain, unspecified: Secondary | ICD-10-CM

## 2019-12-29 ENCOUNTER — Other Ambulatory Visit: Payer: Self-pay | Admitting: Internal Medicine

## 2019-12-29 ENCOUNTER — Ambulatory Visit
Admission: RE | Admit: 2019-12-29 | Discharge: 2019-12-29 | Disposition: A | Discharge status: Home / Self Care | Payer: Medicare Other | Source: Ambulatory Visit | Attending: Family | Admitting: Family

## 2019-12-29 ENCOUNTER — Other Ambulatory Visit: Payer: Self-pay

## 2019-12-29 DIAGNOSIS — Z1231 Encounter for screening mammogram for malignant neoplasm of breast: Secondary | ICD-10-CM | POA: Diagnosis not present

## 2019-12-30 ENCOUNTER — Ambulatory Visit (INDEPENDENT_AMBULATORY_CARE_PROVIDER_SITE_OTHER)
Admission: RE | Admit: 2019-12-30 | Discharge: 2019-12-30 | Disposition: A | Discharge status: Home / Self Care | Payer: Medicare Other | Source: Ambulatory Visit | Attending: Family | Admitting: Family

## 2019-12-30 DIAGNOSIS — R079 Chest pain, unspecified: Secondary | ICD-10-CM

## 2019-12-30 DIAGNOSIS — R0602 Shortness of breath: Secondary | ICD-10-CM | POA: Diagnosis not present

## 2019-12-30 MED ORDER — IOHEXOL 350 MG/ML SOLN
65.0000 mL | Freq: Once | INTRAVENOUS | Status: AC | PRN
  Administered 2019-12-30: 65 mL via INTRAVENOUS

## 2020-01-05 NOTE — Progress Notes (Signed)
Cardiology Office Note:    Date:  01/06/2020   ID:  [name], DOB [birth year], MRN [id number]  PCP:  [name], [place]  Cardiologist:  Sheryl Carnes, MD   Electrophysiologist:  None   Referring MD: [name],*   Chief Complaint:  Hospitalization Follow-up (Admx with sepsis, AKI, ?early DKA)    Patient Profile:    [name] is a 84 y.o. female with:   Permanent atrial fibrillation   Coronary artery disease   Myoview 10/17: ant ischemia  Cor CTA 11/17: Ca score 51, mod CAD in prox and mid LAD; oD2 poss severe dz (neg by FFR) >> Med Rx  Diabetes mellitus   Hypertension   Hyperlipidemia   Lung nodule  RML 4 mm on CT in 11/17  CT 12/19: unchanged; likely vascular   Asthma   Prior CV studies:   Coronary CTA 08/11/2016 IMPRESSION: 1. Coronary calcium score of 51. This was 103 percentile for age and sex matched control. 2. Normal coronary origin with right dominance. 3. There is moderate CAD in the proximal and mid LAD and possible severe stenosis in the ostial D2. We will obtain additional CT FFR Date. (Reportedly not hemodynamically significant).  4. Left atrial appendage is large with a filling defect in its distal portion, this might represent poor contrast mixing, however thrombus can't be excluded. 5. Mitral annular calcifications.  IMPRESSION: 1. Pulmonary artery enlargement suggests pulmonary arterial hypertension. 2. Hiatal hernia. Distal esophageal wall thickening suggests esophagitis. 3. Right middle lobe 4 mm pulmonary nodule. No follow-up needed if patient is low-risk. Non-contrast chest CT can be considered in 12 months if patient is high-risk. This recommendation follows the consensus statement: Guidelines for Management of Incidental Pulmonary Nodules Detected on CT Images: From the Fleischner Society 2017; Radiology 2017; 284:228-243. 4. Right breast nodule was similar in size in 2012. Although technically  indeterminate, this favors a benign lesion.  Nuclear stress test 07/21/2016 EF 73, anterior ischemia, intermediate risk  Echo 09/15/14 Mild LVH, EF 65-70, no RWMA, Gr 2 DD, mod to severe MAC, mild MS (mean 6), severe LAE, mild RV dilation, normal RVSF, PASP 39 (mild pulmonary HTN)  History of Present Illness:    [name] was last seen in clinic by Dr. Harrington Challenger in 09/2019.  She was admitted 3/19-3/22 with sepsis and AKI and possible early DKA.   She returns for follow-up.  She is here alone.  Since discharge, she had follow-up with primary care.  Creatinine improved to 1.68.  She had bilateral lower extremity Dopplers performed which demonstrated no evidence of DVT.  She did have an elevated D-dimer.  Follow-up chest CTA demonstrated no pulmonary emboli.  The patient notes fatigue since her admission to the hospital.  This is slowly improving.  She notes dyspnea with exertion.  This is fairly chronic and she relates it to her asthma.  She has not had chest discomfort, syncope, orthopnea.  She has had lower extremity swelling.  She notes ankle swelling with prolonged standing.  This resolves with leg elevation.  Past Medical History:  Diagnosis Date  . Allergic rhinitis   . Anterior chest wall pain   . Anxiety   . Asthma   . Cough   . DM type 2 (diabetes mellitus, type 2) ([place])   . GERD (gastroesophageal reflux disease)   . History of diverticulitis of colon   . HTN (hypertension)   . Hyperlipidemia   . IBS (irritable bowel syndrome)   . Iron  deficiency anemia   . Osteoporosis, unspecified   . Renal insufficiency 07/31/2017  . UTI (urinary tract infection)     Current Medications: Current Meds  Medication Sig  . acetaminophen (TYLENOL) 325 MG tablet Take 650 mg by mouth every 6 (six) hours as needed for pain.  Marland Kitchen albuterol (PROAIR HFA) 108 (90 Base) MCG/ACT inhaler Inhale 2 puffs into the lungs every 4 (four) hours as needed for wheezing.  Marland Kitchen amLODipine (NORVASC) 5 MG tablet TAKE 1  TABLET BY MOUTH TWICE A DAY  . Blood Glucose Monitoring Suppl (ONE TOUCH ULTRA 2) W/DEVICE KIT Check blood sugar 3 times per day.  . budesonide-formoterol (SYMBICORT) 160-4.5 MCG/ACT inhaler Take 2 puffs first thing in am and then another 2 puffs about 12 hours later.  Marland Kitchen ELIQUIS 2.5 MG TABS tablet TAKE 1 TABLET BY MOUTH TWICE A DAY  . insulin lispro (HUMALOG) 100 UNIT/ML injection 3 units with breakfast, and 22 units with supper  . Insulin Syringe-Needle U-100 (BD INSULIN SYRINGE ULTRAFINE) 31G X 5/16" 0.3 ML MISC Use as directed   . losartan-hydrochlorothiazide (HYZAAR) 100-25 MG tablet TAKE 1 TABLET BY MOUTH EVERY DAY  . montelukast (SINGULAIR) 10 MG tablet TAKE 1 TABLET BY MOUTH AT BEDTIME  . omeprazole (PRILOSEC) 40 MG capsule TAKE 1 CAPSULE BY MOUTH EVERY DAY  . ONE TOUCH LANCETS MISC Use as directed three times daily   . potassium chloride (KLOR-CON) 10 MEQ tablet TAKE 1 TABLET BY MOUTH EVERY DAY  . pravastatin (PRAVACHOL) 40 MG tablet Take 1 tablet (40 mg total) by mouth daily.     Allergies:   Aspirin and Ramipril   Social History   Tobacco Use  . Smoking status: Former Smoker    Packs/day: 0.30    Years: 5.00    Pack years: 1.50    Types: Cigarettes    Quit date: 10/02/1986    Years since quitting: 33.2  . Smokeless tobacco: Never Used  Substance Use Topics  . Alcohol use: No  . Drug use: No     Family Hx: The patient's family history includes Hypertension in her mother; Lung cancer in her brother; Melanoma in her brother; Other in her father; Stroke in her mother. There is no history of Atopy, Asthma, or Breast cancer.  ROS   EKGs/Labs/Other Test Reviewed:    EKG:  EKG is   ordered today.  The ekg ordered today demonstrates atrial fibrillation, HR 50, right axis deviation, right bundle branch block, QTC 441, no change from prior tracing  Recent Labs: 08/27/2019: TSH 1.89 12/20/2019: B Natriuretic Peptide 272.8 12/24/2019: ALT 20 01/06/2020: BUN 34; Creatinine, Ser  1.97; Hemoglobin 10.3; Platelets 291.0; Potassium 4.2; Sodium 139   Recent Lipid Panel Lab Results  Component Value Date/Time   CHOL 104 08/27/2019 11:20 AM   TRIG 59.0 08/27/2019 11:20 AM   TRIG 83 08/07/2006 02:19 PM   HDL 44.90 08/27/2019 11:20 AM   CHOLHDL 2 08/27/2019 11:20 AM   LDLCALC 47 08/27/2019 11:20 AM    Physical Exam:    VS:  BP (!) 152/60   Pulse (!) 50   Ht _0  (1.702 m)   Wt 199 lb 6.4 oz (90.4 kg)   SpO2 95%   BMI 31.23 kg/m     Wt Readings from Last 3 Encounters:  01/06/20 199 lb 6.4 oz (90.4 kg)  12/24/19 199 lb (90.3 kg)  12/22/19 197 lb 9.6 oz (89.6 kg)     Constitutional:      Appearance: Healthy  appearance. Not in distress.  Neck:     Vascular: JVD normal.  Pulmonary:     Breath sounds: No wheezing. No rales.  Cardiovascular:     Normal rate. Irregularly irregular rhythm.     Murmurs: There is a grade 2/6 systolic murmur at the ULSB.  Edema:    Ankle: bilateral 1+ edema of the ankle. Abdominal:     Palpations: Abdomen is soft. There is no hepatomegaly.  Skin:    General: Skin is warm and dry.  Neurological:     General: No focal deficit present.     Mental Status: Alert and oriented to person, place and time.       ASSESSMENT & PLAN:    1. Shortness of breath 2. Leg swelling She notes shortness of breath that is fairly chronic and related to asthma.  She has also had some leg swelling.  I suspect her leg swelling is related to venous insufficiency.  Her last echocardiogram in 2015 demonstrated normal LV function, moderate diastolic dysfunction.  There was mild mitral stenosis at that time and mild pulmonary hypertension.  Given her shortness of breath and leg swelling, I think it would be helpful to repeat her echocardiogram to rule out LV dysfunction, significant RV dysfunction or worsening valvular disease.  -Arrange echocardiogram  3. Permanent atrial fibrillation (HCC) Rate is controlled.  She is not on rate controlling  medications.  She is tolerating anticoagulation.  She is on a.m. appropriate dose of at apixaban 2.5 mg twice daily (age, creatinine).  Recent hemoglobin fairly stable.  4. Coronary artery disease involving native coronary artery of native heart without angina pectoris Moderate coronary disease by coronary CTA in 2017.  She is not having angina.  She is not on aspirin as she is on Apixaban.  Continue pravastatin.  5. Essential hypertension Blood pressure is above target.  We will provide her with a blood pressure cuff today.  I have asked her to check her blood pressure over the next couple weeks and send me those readings for review.  If her pressure remains >130/80, I will place her on hydralazine.  Dispo:  Return in about 6 weeks (around 02/17/2020) for Routine Follow Up, w/ Dr. Harrington Challenger, or Richardson Dopp, PA-C, in person.   Medication Adjustments/Labs and Tests Ordered: Current medicines are reviewed at length with the patient today.  Concerns regarding medicines are outlined above.  Tests Ordered: Orders Placed This Encounter  Procedures  . EKG 12-Lead  . ECHOCARDIOGRAM COMPLETE   Medication Changes: No orders of the defined types were placed in this encounter.   Signed, Richardson Dopp, PA-C  01/06/2020 11:45 AM    [address], [place], [place]  [address] Phone: [phone]; Fax: [phone]

## 2020-01-06 ENCOUNTER — Other Ambulatory Visit: Payer: Medicare Other

## 2020-01-06 ENCOUNTER — Encounter: Payer: Self-pay | Admitting: Physician Assistant

## 2020-01-06 ENCOUNTER — Other Ambulatory Visit: Payer: Self-pay

## 2020-01-06 ENCOUNTER — Ambulatory Visit: Payer: Medicare Other | Admitting: Physician Assistant

## 2020-01-06 ENCOUNTER — Other Ambulatory Visit (INDEPENDENT_AMBULATORY_CARE_PROVIDER_SITE_OTHER): Payer: Medicare Other

## 2020-01-06 VITALS — BP 152/60 | HR 50 | Ht 67.0 in | Wt 199.4 lb

## 2020-01-06 DIAGNOSIS — D509 Iron deficiency anemia, unspecified: Secondary | ICD-10-CM

## 2020-01-06 DIAGNOSIS — I4821 Permanent atrial fibrillation: Secondary | ICD-10-CM

## 2020-01-06 DIAGNOSIS — R0602 Shortness of breath: Secondary | ICD-10-CM

## 2020-01-06 DIAGNOSIS — I2510 Atherosclerotic heart disease of native coronary artery without angina pectoris: Secondary | ICD-10-CM | POA: Diagnosis not present

## 2020-01-06 DIAGNOSIS — I10 Essential (primary) hypertension: Secondary | ICD-10-CM | POA: Diagnosis not present

## 2020-01-06 DIAGNOSIS — M7989 Other specified soft tissue disorders: Secondary | ICD-10-CM | POA: Diagnosis not present

## 2020-01-06 LAB — CBC WITH DIFFERENTIAL/PLATELET
Basophils Absolute: 0.1 10*3/uL (ref 0.0–0.1)
Basophils Relative: 1.2 % (ref 0.0–3.0)
Eosinophils Absolute: 0.1 10*3/uL (ref 0.0–0.7)
Eosinophils Relative: 0.9 % (ref 0.0–5.0)
HCT: 31.5 % — ABNORMAL LOW (ref 36.0–46.0)
Hemoglobin: 10.3 g/dL — ABNORMAL LOW (ref 12.0–15.0)
Lymphocytes Relative: 12.2 % (ref 12.0–46.0)
Lymphs Abs: 1 10*3/uL (ref 0.7–4.0)
MCHC: 32.8 g/dL (ref 30.0–36.0)
MCV: 85.5 fl (ref 78.0–100.0)
Monocytes Absolute: 0.7 10*3/uL (ref 0.1–1.0)
Monocytes Relative: 8.7 % (ref 3.0–12.0)
Neutro Abs: 6.4 10*3/uL (ref 1.4–7.7)
Neutrophils Relative %: 77 % (ref 43.0–77.0)
Platelets: 291 10*3/uL (ref 150.0–400.0)
RBC: 3.69 Mil/uL — ABNORMAL LOW (ref 3.87–5.11)
RDW: 18.1 % — ABNORMAL HIGH (ref 11.5–15.5)
WBC: 8.3 10*3/uL (ref 4.0–10.5)

## 2020-01-06 LAB — BASIC METABOLIC PANEL
BUN: 34 mg/dL — ABNORMAL HIGH (ref 6–23)
CO2: 19 mEq/L (ref 19–32)
Calcium: 9 mg/dL (ref 8.4–10.5)
Chloride: 110 mEq/L (ref 96–112)
Creatinine, Ser: 1.97 mg/dL — ABNORMAL HIGH (ref 0.40–1.20)
GFR: 29.39 mL/min — ABNORMAL LOW (ref >60.00)
Glucose, Bld: 135 mg/dL — ABNORMAL HIGH (ref 70–99)
Potassium: 4.2 mEq/L (ref 3.5–5.1)
Sodium: 139 mEq/L (ref 135–145)

## 2020-01-06 NOTE — Patient Instructions (Signed)
Medication Instructions:   Your physician recommends that you continue on your current medications as directed. Please refer to the Current Medication list given to you today.  *If you need a refill on your cardiac medications before your next appointment, please call your pharmacy*  Lab Work:  None ordered today  Testing/Procedures:  Your physician has requested that you have an echocardiogram. Echocardiography is a painless test that uses sound waves to create images of your heart. It provides your doctor with information about the size and shape of your heart and how well your heart's chambers and valves are working. This procedure takes approximately one hour. There are no restrictions for this procedure.  Follow-Up: At [facility], you and your health needs are our priority.  As part of our continuing mission to provide you with exceptional heart care, we have created designated Provider Care Teams.  These Care Teams include your primary Cardiologist (physician) and Advanced Practice Providers (APPs -  Physician Assistants and Nurse Practitioners) who all work together to provide you with the care you need, when you need it.  We recommend signing up for the patient portal called "MyChart".  Sign up information is provided on this After Visit Summary.  MyChart is used to connect with patients for Virtual Visits (Telemedicine).  Patients are able to view lab/test results, encounter notes, upcoming appointments, etc.  Non-urgent messages can be sent to your provider as well.   To learn more about what you can do with MyChart, go to [email or web address].    Your next appointment:   6 week(s)  The format for your next appointment:   In Person  Provider:   You may see Dorris Carnes, MD or Richardson Dopp, PA-C  Other Instructions  Wear knee high compression hose, elevate legs while sitting. Check your blood pressure 3-4 times a week for 2 weeks. Call with readings.

## 2020-01-07 LAB — IRON,TIBC AND FERRITIN PANEL
%SAT: 15 % (calc) — ABNORMAL LOW (ref 16–45)
Ferritin: 97 ng/mL (ref 16–288)
Iron: 38 ug/dL — ABNORMAL LOW (ref 45–160)
TIBC: 259 mcg/dL (calc) (ref 250–450)

## 2020-01-09 ENCOUNTER — Other Ambulatory Visit: Payer: Self-pay | Admitting: Family

## 2020-01-09 MED ORDER — CEPHALEXIN 250 MG PO CAPS: 250.0000 mg | ORAL_CAPSULE | Freq: Two times a day (BID) | ORAL | 0 refills | Status: DC

## 2020-01-09 MED ORDER — FLUCONAZOLE 150 MG PO TABS: ORAL_TABLET | ORAL | 0 refills | Status: DC

## 2020-01-12 ENCOUNTER — Telehealth: Payer: Self-pay

## 2020-01-16 DIAGNOSIS — R1032 Left lower quadrant pain: Secondary | ICD-10-CM | POA: Diagnosis not present

## 2020-01-16 DIAGNOSIS — A419 Sepsis, unspecified organism: Secondary | ICD-10-CM | POA: Diagnosis not present

## 2020-01-16 DIAGNOSIS — Z86010 Personal history of colonic polyps: Secondary | ICD-10-CM | POA: Diagnosis not present

## 2020-01-20 ENCOUNTER — Other Ambulatory Visit: Payer: Self-pay | Admitting: Family

## 2020-01-20 DIAGNOSIS — R7989 Other specified abnormal findings of blood chemistry: Secondary | ICD-10-CM

## 2020-01-20 NOTE — Progress Notes (Signed)
I can see that she is scheduled to see cardiology tomorrow; if they don't update labs to re-check her kidney functions, she needs to get labs done to re-check. I am going to put the order in for [facility] and she can go to that location and do at her convenience.  Patient notified and agrees to plan; notes she is feeling better regarding the urinary burning/ itching;

## 2020-01-21 ENCOUNTER — Ambulatory Visit (HOSPITAL_COMMUNITY): Payer: Medicare Other | Attending: Cardiology

## 2020-01-21 ENCOUNTER — Encounter: Payer: Self-pay | Admitting: Physician Assistant

## 2020-01-21 ENCOUNTER — Other Ambulatory Visit: Payer: Self-pay

## 2020-01-21 DIAGNOSIS — M7989 Other specified soft tissue disorders: Secondary | ICD-10-CM | POA: Diagnosis not present

## 2020-01-21 DIAGNOSIS — R0602 Shortness of breath: Secondary | ICD-10-CM | POA: Diagnosis not present

## 2020-01-23 ENCOUNTER — Telehealth: Payer: Self-pay

## 2020-01-23 DIAGNOSIS — R0602 Shortness of breath: Secondary | ICD-10-CM

## 2020-01-23 DIAGNOSIS — M7989 Other specified soft tissue disorders: Secondary | ICD-10-CM

## 2020-01-23 MED ORDER — LOSARTAN POTASSIUM 100 MG PO TABS: 100.0000 mg | ORAL_TABLET | Freq: Every day | ORAL | 3 refills | Status: DC

## 2020-01-23 MED ORDER — FUROSEMIDE 40 MG PO TABS: 40.0000 mg | ORAL_TABLET | Freq: Every day | ORAL | 3 refills | Status: DC

## 2020-01-23 NOTE — Telephone Encounter (Signed)
-----   Message from Liliane Shi, [place] sent at 01/21/2020  5:28 PM EDT ----- Please call the patient. The echocardiogram shows normal LV function.  She does have moderately elevated pulmonary pressures as well as findings consistent with volume overload. She should be on furosemide.  Since I am placing her on furosemide, we will need to stop her HCTZ. PLAN:   -Stop losartan/HCTZ  -Start losartan 100 mg daily  -Start furosemide 40 mg daily  -BMET 1 week  -Keep follow up with me in May  -Send copy to PCP Richardson Dopp, PA-C    01/21/2020 5:21 PM

## 2020-01-23 NOTE — Telephone Encounter (Signed)
The patient has been notified of the result and verbalized understanding.  All questions (if any) were answered. Patient will stop Losartan/HCTZ and start Losartan 100 mg, 1 tablet by mouth once a day and Furosemide 40 mg, 1 tablet by mouth once a day. Patient will come back on 01/30/20 for repeat BMET. Orders in for labs and new prescriptions sent to patients pharmacy. Mady Haagensen, [place] 01/23/2020 9:07 AM

## 2020-01-30 ENCOUNTER — Other Ambulatory Visit: Payer: Medicare Other | Admitting: *Deleted

## 2020-01-30 ENCOUNTER — Other Ambulatory Visit: Payer: Self-pay

## 2020-01-30 DIAGNOSIS — M7989 Other specified soft tissue disorders: Secondary | ICD-10-CM

## 2020-01-30 DIAGNOSIS — R0602 Shortness of breath: Secondary | ICD-10-CM

## 2020-01-30 LAB — BASIC METABOLIC PANEL WITH GFR
BUN/Creatinine Ratio: 9 — ABNORMAL LOW (ref 12–28)
BUN: 17 mg/dL (ref 8–27)
CO2: 19 mmol/L — ABNORMAL LOW (ref 20–29)
Calcium: 9.4 mg/dL (ref 8.7–10.3)
Chloride: 105 mmol/L (ref 96–106)
Creatinine, Ser: 1.79 mg/dL — ABNORMAL HIGH (ref 0.57–1.00)
GFR calc Af Amer: 30 mL/min/1.73 — ABNORMAL LOW
GFR calc non Af Amer: 26 mL/min/1.73 — ABNORMAL LOW
Glucose: 186 mg/dL — ABNORMAL HIGH (ref 65–99)
Potassium: 4.2 mmol/L (ref 3.5–5.2)
Sodium: 142 mmol/L (ref 134–144)

## 2020-02-16 ENCOUNTER — Encounter: Payer: Self-pay | Admitting: Physician Assistant

## 2020-02-16 DIAGNOSIS — I5042 Chronic combined systolic (congestive) and diastolic (congestive) heart failure: Secondary | ICD-10-CM | POA: Insufficient documentation

## 2020-02-16 DIAGNOSIS — I5032 Chronic diastolic (congestive) heart failure: Secondary | ICD-10-CM | POA: Insufficient documentation

## 2020-02-16 NOTE — Progress Notes (Signed)
Cardiology Office Note:    Date:  02/17/2020   ID:  [name], DOB 03-Jun-1936, MRN [id number]  PCP:  [name], [place]  Cardiologist:  Sheryl Carnes, MD   Electrophysiologist:  None   Referring MD: [name],*   Chief Complaint:  Follow-up (CHF)    Patient Profile:    [name] is a 84 y.o. female with:   Permanent atrial fibrillation   Coronary artery disease  ? Myoview 10/17: ant ischemia ? Cor CTA 11/17: Ca score 51, mod CAD in prox and mid LAD; oD2 poss severe dz (neg by FFR) >> Med Rx  Diastolic CHF  Echocardiogram 01/2020: EF 60-65, BAE, mild MS, elevated LVEDP   Diabetes mellitus   Hypertension   Hyperlipidemia   Lung nodule ? RML 4 mm on CT in 11/17 ? CT 12/19: unchanged; likely vascular   Asthma   Prior CV studies: Echo 01/2020:  EF 60-65, normal wall motion, mild LVH, normal RV SF, RVSP 52.6 (moderate elevation), severe LAE, moderate RAE, trivial MR, mild MS (mean gradient 5.5 mmHg), mild aortic valve sclerosis (no AS); elevated E/e' c/w elevated LVEDP  Coronary CTA 08/11/2016 Coronary calcium score of 51 (47 percentile) mod CAD in proximal and mid LAD; poss severe stenosis in the ostial D2.  FFR neg >> Med Rx  Nuclear stress test 07/21/2016 EF 73, anterior ischemia, intermediate risk  Echo 09/15/14 Mild LVH, EF 65-70, no RWMA, Gr 2 DD, mod to severe MAC, mild MS (mean 6), severe LAE, mild RV dilation, normal RVSF, PASP 39 (mild pulmonary HTN)  History of Present Illness:    [name] was last seen in 01/2020.  She had recently been admitted to the hospital with sepsis and AKI (poss early DKA).  At her last visit, she noted dyspnea on exertion and leg swelling.  A follow up echocardiogram demonstrated normal LVF.  There was a very elevated E/e' c/w elevated LVEDP.  I changed her HCTZ to Furosemide.  She returns for follow up.   She is here alone.  Since last seen, she has been doing well.  Her breathing is improved.  However,  she remains short of breath at times due to her asthma.  She has not had chest pain, syncope, orthopnea, leg swelling.  She has not had any melena, hematochezia, hematuria.    Past Medical History:  Diagnosis Date  . Allergic rhinitis   . Anterior chest wall pain   . Anxiety   . Asthma   . Chronic diastolic CHF (congestive heart failure) (HCC)    Echo 01/2020: EF 60-65, normal wall motion, mild LVH, normal RV SF, RVSP 52.6 (moderate elevation), severe LAE, moderate RAE, trivial MR, mild MS (mean gradient 5.5 mmHg), mild aortic valve sclerosis (no AS); elevated E/e' c/w elevated LVEDP  . Cough   . DM type 2 (diabetes mellitus, type 2) ([place])   . GERD (gastroesophageal reflux disease)   . History of diverticulitis of colon   . HTN (hypertension)   . Hyperlipidemia   . IBS (irritable bowel syndrome)   . Iron deficiency anemia   . Osteoporosis, unspecified   . Renal insufficiency 07/31/2017  . UTI (urinary tract infection)     Current Medications: Current Meds  Medication Sig  . acetaminophen (TYLENOL) 325 MG tablet Take 650 mg by mouth every 6 (six) hours as needed for pain.  Marland Kitchen albuterol (PROAIR HFA) 108 (90 Base) MCG/ACT inhaler Inhale 2 puffs into the lungs every 4 (four) hours  as needed for wheezing.  Marland Kitchen amLODipine (NORVASC) 5 MG tablet TAKE 1 TABLET BY MOUTH TWICE A DAY  . Blood Glucose Monitoring Suppl (ONE TOUCH ULTRA 2) W/DEVICE KIT Check blood sugar 3 times per day.  . budesonide-formoterol (SYMBICORT) 160-4.5 MCG/ACT inhaler Take 2 puffs first thing in am and then another 2 puffs about 12 hours later.  Marland Kitchen ELIQUIS 2.5 MG TABS tablet TAKE 1 TABLET BY MOUTH TWICE A DAY  . furosemide (LASIX) 40 MG tablet Take 1 tablet (40 mg total) by mouth daily.  . insulin lispro (HUMALOG) 100 UNIT/ML injection 3 units with breakfast, and 22 units with supper  . Insulin Syringe-Needle U-100 (BD INSULIN SYRINGE ULTRAFINE) 31G X 5/16" 0.3 ML MISC Use as directed   . losartan (COZAAR) 100 MG tablet  Take 1 tablet (100 mg total) by mouth daily.  . montelukast (SINGULAIR) 10 MG tablet TAKE 1 TABLET BY MOUTH AT BEDTIME  . omeprazole (PRILOSEC) 40 MG capsule TAKE 1 CAPSULE BY MOUTH EVERY DAY  . ONE TOUCH LANCETS MISC Use as directed three times daily   . potassium chloride (KLOR-CON) 10 MEQ tablet TAKE 1 TABLET BY MOUTH EVERY DAY  . pravastatin (PRAVACHOL) 40 MG tablet Take 1 tablet (40 mg total) by mouth daily.     Allergies:   Aspirin and Ramipril   Social History   Tobacco Use  . Smoking status: Former Smoker    Packs/day: 0.30    Years: 5.00    Pack years: 1.50    Types: Cigarettes    Quit date: 10/02/1986    Years since quitting: 33.4  . Smokeless tobacco: Never Used  Substance Use Topics  . Alcohol use: No  . Drug use: No     Family Hx: The patient's family history includes Hypertension in her mother; Lung cancer in her brother; Melanoma in her brother; Other in her father; Stroke in her mother. There is no history of Atopy, Asthma, or Breast cancer.  ROS   EKGs/Labs/Other Test Reviewed:    EKG:  EKG is  ordered today.  The ekg ordered today demonstrates atrial fibrillation, HR 58, RAD, RBBB, QTc 467, no changes  Recent Labs: 08/27/2019: TSH 1.89 12/20/2019: B Natriuretic Peptide 272.8 12/24/2019: ALT 20 01/06/2020: Hemoglobin 10.3; Platelets 291.0 01/30/2020: BUN 17; Creatinine, Ser 1.79; Potassium 4.2; Sodium 142   Recent Lipid Panel Lab Results  Component Value Date/Time   CHOL 104 08/27/2019 11:20 AM   TRIG 59.0 08/27/2019 11:20 AM   TRIG 83 08/07/2006 02:19 PM   HDL 44.90 08/27/2019 11:20 AM   CHOLHDL 2 08/27/2019 11:20 AM   LDLCALC 47 08/27/2019 11:20 AM    Physical Exam:    VS:  BP (!) 142/72   Pulse (!) 58   Ht _0  (1.702 m)   Wt 189 lb 12.8 oz (86.1 kg)   SpO2 98%   BMI 29.73 kg/m     Wt Readings from Last 3 Encounters:  02/17/20 189 lb 12.8 oz (86.1 kg)  01/06/20 199 lb 6.4 oz (90.4 kg)  12/24/19 199 lb (90.3 kg)     Constitutional:       Appearance: Healthy appearance. Not in distress.  Neck:     Vascular: JVD normal.  Pulmonary:     Breath sounds: No wheezing. No rales.  Cardiovascular:     Normal rate. Irregularly irregular rhythm.     Murmurs: There is a grade 2/6 systolic murmur at the ULSB.     S4 Gallop.  Edema:  Peripheral edema absent.  Abdominal:     Palpations: Abdomen is soft. There is no hepatomegaly.  Skin:    General: Skin is warm and dry.  Neurological:     General: No focal deficit present.     Mental Status: Alert and oriented to person, place and time.       ASSESSMENT & PLAN:    1. Chronic diastolic CHF (congestive heart failure) (HCC) EF 60-65 by echocardiogram in 01/2020.  NYHA 2b.  Volume status appears stable.  She has some shortness of breath related to asthma.  We discussed the importance of limiting salt in her diet.  Continue current Rx.   2. Permanent atrial fibrillation (HCC) Rate is controlled.  She is tolerating anticoagulation.  Continue current dose of Apixaban.    3. Coronary artery disease involving native coronary artery of native heart without angina pectoris Mod coronary artery disease by CTA in 2017.  She is not having angina.  Continue pravastatin.  She is not on ASA as she is on Apixaban.   4. Essential hypertension BP above goal.  She has not taken medications yet this AM.  I have asked her to collect BP readings over the next 1-2 weeks and send them in for review.  If her BP remains above goal, I will add Hydralazine 25 mg three times a day.    5. Chronic kidney disease  Recent creatinine stable.    Dispo:  Return in about 6 months (around 08/19/2020) for Routine Follow Up, w/ Dr. Harrington Challenger, or Richardson Dopp, PA-C, in person.   Medication Adjustments/Labs and Tests Ordered: Current medicines are reviewed at length with the patient today.  Concerns regarding medicines are outlined above.  Tests Ordered: Orders Placed This Encounter  Procedures  . EKG 12-Lead    Medication Changes: No orders of the defined types were placed in this encounter.   Signed, Richardson Dopp, PA-C  02/17/2020 9:25 AM    [facility] [place], [place], [place]  [address] Phone: [phone]; Fax: [phone]

## 2020-02-17 ENCOUNTER — Ambulatory Visit: Payer: Medicare Other | Admitting: Physician Assistant

## 2020-02-17 ENCOUNTER — Encounter: Payer: Self-pay | Admitting: Physician Assistant

## 2020-02-17 ENCOUNTER — Other Ambulatory Visit: Payer: Self-pay

## 2020-02-17 VITALS — BP 142/72 | HR 58 | Ht 67.0 in | Wt 189.8 lb

## 2020-02-17 DIAGNOSIS — I5032 Chronic diastolic (congestive) heart failure: Secondary | ICD-10-CM | POA: Diagnosis not present

## 2020-02-17 DIAGNOSIS — I4821 Permanent atrial fibrillation: Secondary | ICD-10-CM

## 2020-02-17 DIAGNOSIS — I10 Essential (primary) hypertension: Secondary | ICD-10-CM

## 2020-02-17 DIAGNOSIS — N184 Chronic kidney disease, stage 4 (severe): Secondary | ICD-10-CM | POA: Diagnosis not present

## 2020-02-17 DIAGNOSIS — I2510 Atherosclerotic heart disease of native coronary artery without angina pectoris: Secondary | ICD-10-CM

## 2020-02-17 NOTE — Patient Instructions (Signed)
Medication Instructions:   Your physician recommends that you continue on your current medications as directed. Please refer to the Current Medication list given to you today.  *If you need a refill on your cardiac medications before your next appointment, please call your pharmacy*  Lab Work:  None ordered today  Testing/Procedures:  None ordered today  Follow-Up: At [facility], you and your health needs are our priority.  As part of our continuing mission to provide you with exceptional heart care, we have created designated Provider Care Teams.  These Care Teams include your primary Cardiologist (physician) and Advanced Practice Providers (APPs -  Physician Assistants and Nurse Practitioners) who all work together to provide you with the care you need, when you need it.  We recommend signing up for the patient portal called "MyChart".  Sign up information is provided on this After Visit Summary.  MyChart is used to connect with patients for Virtual Visits (Telemedicine).  Patients are able to view lab/test results, encounter notes, upcoming appointments, etc.  Non-urgent messages can be sent to your provider as well.   To learn more about what you can do with MyChart, go to [email or web address].    Your next appointment:   6 month(s)  The format for your next appointment:   In Person  Provider:   You may see Dorris Carnes, MD or Richardson Dopp, PA-C  Other Instructions  Check your blood pressure once a day for 1-2 weeks. Check around 12:00-1:00PM. Call with readings after 1-2 weeks.

## 2020-03-10 ENCOUNTER — Telehealth: Payer: Self-pay | Admitting: Emergency Medicine

## 2020-03-10 MED ORDER — HYDRALAZINE HCL 25 MG PO TABS: 25.0000 mg | ORAL_TABLET | Freq: Three times a day (TID) | ORAL | 3 refills | Status: DC

## 2020-03-10 NOTE — Telephone Encounter (Signed)
Agree with Scott's recommendation to add hydralazine 25mg  TID if home BP readings have been consistently > 643 systolic. Can schedule follow up in HTN clinic in 1-2 weeks if pt is interested. (she previously followed with PharmD in 2019 for her BP).

## 2020-03-10 NOTE — Telephone Encounter (Signed)
Spoke with pt who is calling to report her B/P readings.  Pt reports current reading of 198/81.  Pt states she takes her B/P multiple times throughout the day.  Pt advised best to take B/P 2 hours after taking medication, both feet should be flat on the floor with arm resting at heart level.  Pt has taken medication today.  Denies CP, SOB, headache, weakness or dizziness.  Will forward information to Pharm D for review and recommendation as Richardson Dopp noted on 02/17/2020 "If her BP remains above goal, I will add Hydralazine 25 mg three times a day"  Pt advised if she develops any symptoms as mentioned above to go directly to ED for further evaluation.  Pt verbalizes understanding and agrees with current plan.

## 2020-03-10 NOTE — Telephone Encounter (Signed)
Patient is calling about her high blood pressure. Patient states it's been real high, 186/80 or 176/70. Patient states she has not checked her pressure today. Patient did not advice of any symptoms during call.    (.dot phrases not available)

## 2020-03-10 NOTE — Telephone Encounter (Signed)
Spoke with pt and advised of need to add Hydralazine 25mg   - 1 tablet by mouth three times daily. Rx sent to pharmacy on file. Discussed HTN clinic with pt. Pt will continue follow up with Dr Harrington Challenger and Nicki Reaper [facility] for now.  Advised pt to continue close monitoring of her B/P and keep log.  Pt verbalizes understanding and agrees with current plan.

## 2020-03-12 ENCOUNTER — Telehealth: Payer: Self-pay | Admitting: Internal Medicine

## 2020-03-12 MED ORDER — PREDNISONE 10 MG PO TABS: ORAL_TABLET | ORAL | 0 refills | Status: DC

## 2020-03-12 NOTE — Telephone Encounter (Signed)
Prednisone 10 mg take  4 each am x 2 days,   2 each am x 2 days,  1 each am x 2 days and stop  Call if not improving > if worse to go ER

## 2020-03-12 NOTE — Telephone Encounter (Signed)
Spoke with pt, she states she is coughing and sneezing for 3-4 days. She is real SOB but still takes Symbicort and albuterol inhaler. The albuterol helps somewhat when she takes it but still feels winded. She is really SOB and would like to know if prednisone could be called in. She has no other symptoms, fever, body aches, or sore throat. MW please advise.

## 2020-03-12 NOTE — Telephone Encounter (Signed)
Called and spoke with pt letting her know the info stated by MW and she verbalized understanding. Verified pt's preferred pharmacy and sent Rx for prednisone to pharmacy for pt.nothing further needed.

## 2020-03-17 DIAGNOSIS — M170 Bilateral primary osteoarthritis of knee: Secondary | ICD-10-CM | POA: Diagnosis not present

## 2020-03-17 DIAGNOSIS — M25561 Pain in right knee: Secondary | ICD-10-CM | POA: Diagnosis not present

## 2020-03-20 NOTE — Telephone Encounter (Signed)
Error

## 2020-03-24 DIAGNOSIS — M25561 Pain in right knee: Secondary | ICD-10-CM | POA: Diagnosis not present

## 2020-04-13 ENCOUNTER — Telehealth: Payer: Self-pay | Admitting: *Deleted

## 2020-04-13 NOTE — Telephone Encounter (Signed)
Patient with diagnosis of afib on Eliquis 2.5mg  BID for anticoagulation. Pt on appropriately reduced dose of Eliquis based on age and renal function.  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 Recommend 1 day hold and resume as soon as safely possible after due to elevated cardiac risk.

## 2020-04-13 NOTE — Telephone Encounter (Signed)
   Primary Cardiologist: Dorris Carnes, MD  Chart reviewed as part of pre-operative protocol coverage. Patient was contacted 04/13/2020 in reference to pre-operative risk assessment for pending surgery as outlined below.  [name] was last seen on 02/17/20 by Richardson Dopp, PA-C.  Since that day, [name] has done fine from a cardiac standpoint. She can complete 4 METs without chest pain though has chronic DOE which is unchanged in recent months.  Therefore, based on ACC/AHA guidelines, the patient would be at acceptable risk for the planned procedure without further cardiovascular testing.   Per pharmacy recommendations, patient can hold eliquis 1 day prior to her procedure with plans to restart as soon as she is cleared to do so by Dr. Hoyt Koch.   I will route this recommendation to the requesting party via Epic fax function and remove from pre-op pool. Please call with questions.  Abigail Butts, PA-C 04/13/2020, 12:26 PM

## 2020-04-13 NOTE — Telephone Encounter (Signed)
° °  [facility] Pre-operative Risk Assessment    HEARTCARE STAFF: - Please ensure there is not already an duplicate clearance open for this procedure. - Under Visit Info/Reason for Call, type in Other and utilize the format Clearance MM/DD/YY or Clearance TBD. Do not use dashes or single digits. - If request is for dental extraction, please clarify the # of teeth to be extracted.  Request for surgical clearance:  1. What type of surgery is being performed? REMOVE TUMOR OF LEFT MANDIBULAR GINGIVA   2. When is this surgery scheduled? TBD   3. What type of clearance is required (medical clearance vs. Pharmacy clearance to hold med vs. Both)? BOTH  4. Are there any medications that need to be held prior to surgery and how long? ELIQUIS; DOES PT NEED LOVENOX BRIDGE?   5. Practice name and name of physician performing surgery? SCOTT JENSEN, D.M.D, PA   6. What is the office phone number? [phone]   7.   What is the office fax number? [phone]  8.   Anesthesia type (None, local, MAC, general) ? LOCAL/NITROUS   Julaine Hua 04/13/2020, 11:13 AM  _________________________________________________________________   (provider comments below)

## 2020-04-15 ENCOUNTER — Other Ambulatory Visit: Payer: Self-pay | Admitting: Internal Medicine

## 2020-04-29 ENCOUNTER — Telehealth: Payer: Self-pay | Admitting: Family

## 2020-04-29 NOTE — Telephone Encounter (Signed)
    Patient requesting medication be prescribed for thrush

## 2020-04-30 ENCOUNTER — Other Ambulatory Visit: Payer: Self-pay | Admitting: Family

## 2020-04-30 MED ORDER — NYSTATIN 100000 UNIT/ML MT SUSP: 5.0000 mL | Freq: Four times a day (QID) | OROMUCOSAL | 0 refills | Status: DC

## 2020-04-30 NOTE — Telephone Encounter (Signed)
I sent it in for her but she will need OV if symptoms persist. She can also have pulmonology evaluate when she is there next week if still present.

## 2020-05-03 NOTE — Telephone Encounter (Signed)
Pt was notified w/Laura response on Friday 7/30. Pt will be seeing her pulmonologist on tomorrow 05/04/20.Marland KitchenJohny Chess

## 2020-05-04 ENCOUNTER — Encounter: Payer: Self-pay | Admitting: Internal Medicine

## 2020-05-04 ENCOUNTER — Other Ambulatory Visit: Payer: Self-pay

## 2020-05-04 ENCOUNTER — Ambulatory Visit: Payer: Medicare Other | Admitting: Internal Medicine

## 2020-05-04 DIAGNOSIS — J45991 Cough variant asthma: Secondary | ICD-10-CM

## 2020-05-04 MED ORDER — OMEPRAZOLE 40 MG PO CPDR: 40.0000 mg | DELAYED_RELEASE_CAPSULE | Freq: Every day | ORAL | 11 refills | Status: DC

## 2020-05-04 MED ORDER — PREDNISONE 10 MG PO TABS: ORAL_TABLET | ORAL | 0 refills | Status: DC

## 2020-05-04 MED ORDER — CETIRIZINE HCL 10 MG PO TABS: 10.0000 mg | ORAL_TABLET | Freq: Every day | ORAL | Status: DC | PRN

## 2020-05-04 NOTE — Assessment & Plan Note (Addendum)
PFT's 03/18/08 minimal airflow obstruction  -PFT's January 04, 2010 No sign airflow obstruction   - FENO 01/25/2017  =   23 p am symb 160 x 2 pffs - Spirometry 01/25/2017  wnl x for min curvature on f/v p am symb 160 x 2 / no saba prior - 07/30/2017   After extensive coaching HFA effectiveness =    75% from baseline 50%  - Allergy profile 07/30/17  >  Eos 0.2 /  IgE  145  RAST pos mold  - FENO 01/28/2018  =   17 - Spirometry 01/28/2018  FEV1 1.22 (65%)  Ratio 79  -  05/04/2020  After extensive coaching inhaler device,  effectiveness =    50% (very short Ti)   DDX of  difficult airways management almost all start with A and  include Adherence, Ace Inhibitors, Acid Reflux, Active Sinus Disease, Alpha 1 Antitripsin deficiency, Anxiety masquerading as Airways dz,  ABPA,  Allergy(esp in young), Aspiration (esp in elderly), Adverse effects of meds,  Active smoking or vaping, A bunch of PE's (a small clot burden can't cause this syndrome unless there is already severe underlying pulm or vascular dz with poor reserve) plus two Bs  = Bronchiectasis and Beta blocker use..and one C= CHF   Adherence is always the initial "prime suspect" and is a multilayered concern that requires a "trust but verify" approach in every patient - starting with knowing how to use medications, especially inhalers, correctly, keeping up with refills and understanding the fundamental difference between maintenance and prns vs those medications only taken for a very short course and then stopped and not refilled.  - see hfa teaching/ used teachback/ golfer warm up analogy - instructed on how to do med reconciliation at home "like balancing a checkbook" and call with discrepancies  ? Acid (or non-acid) GERD > always difficult to exclude as up to 75% of pts in some series report no assoc GI/ Heartburn symptoms and she has CT findings suggestive of gerd > rec max (24h)  acid suppression and diet restrictions/ reviewed and instructions given in  writing.   ? Allergy > Prednisone 10 mg take  4 each am x 2 days,   2 each am x 2 days,  1 each am x 2 days and stop / continue high dose ics/ singulair  ? Active sinus dz > at least has rhintis > best choice is otc zyrtec and blow symb 160 out thru nose  ? Adverse effects of meds > none of the usual suspects listed   ? chf > no orthopnea/pnd/leg swelling so not likely here.    F/u q 6 m, call sooner if not back to baseline.    Pt informed of the seriousness of COVID 19 infection as a direct risk to lung health  and safey and to close contacts and should continue to wear a facemask in public and minimize exposure to public locations but especially avoid any area or activity where non-close contacts are not observing distancing or wearing an appropriate face mask.  I strongly recommended she take either of the vaccines available through local drugstores based on updated information on millions of Americans treated with the [employer] products  which have proven both safe and  effective even against the new delta variant.           Each maintenance medication was reviewed in detail including emphasizing most importantly the difference between maintenance and prns and under what circumstances the prns  are to be triggered using an action plan format where appropriate.  Total time for H and P, chart review, counseling, teaching hfa with teachback  and generating customized AVS unique to this office visit / charting = 30 min

## 2020-05-04 NOTE — Progress Notes (Signed)
Subjective:  Patient ID: [name], female   DOB: 11-Feb-1936 .   MRN: [id number]  Brief patient profile:  88 yobf quit smoking 1988 with h/o asthma  And nl baseline pfts ( as of 01/04/2010) who had been using Advair on a p.r.n. basis and noticing increasing dyspnea and need for rescue therapy x one mo when seen 01/06/08 for pulmonary evaluation so Advair stopped. Began Symbicort 160/4.68mg 2 puffs two times a day.  Returned 02/13/08 improved with decreased dyspnea and no rescue use   Returned 03/18/08 symptom free no rescue needed, stopped reglan, no flare    01/25/2017  f/u ov/Chenee Munns re: asthma / ? Allergic rhinitis  rx symb160/singulair  Chief Complaint  Patient presents with  . Follow-up    Last seen 2015. She c/o minimal wheezing for the past wk "b/c of the pollen". She also has minimal dry cough.   She is using proair 2 x per wk on average.   only uses alb if overdoes it - never noct needed Doe = MMRC2 = can't walk a nl pace on a flat grade s sob but does fine slow and flat eg shopping rec Prednisone 10 mg take  4 each am x 2 days,   2 each am x 2 days,  1 each am x 2 days and stop  For cough > mucinex dm up to 1200 mg every 12 hours and add Try prilosec otc 278m Take 30-60 min before first meal of the day and Pepcid ac (famotidine) 20 mg one @  bedtime until cough is completely gone for at least a week without the need for cough suppression GERD diet   01/28/2018  f/u ov/Yacob Wilkerson re:  Asthma  Yearly f/u maiint on symb 160 2bid and singulair  Chief Complaint  Patient presents with  . Follow-up    Reports increased sneezing and dry cough with increase of pollen.   Dyspnea:  MMRC2 = can't walk a nl pace on a flat grade s sob but does fine slow and flat walking at mall  Cough: correlates with sneezing outdoors only Sleep: no resp symptoms SABA use:  Maybe twice a week  rec For itching/ sneezing > zyrtec 10 mg one at bedtime (over the counter) Work on inhaler technique:   Keep walking regularly  and see if using your albuterol 15 min before helps you do more  Or not> usually does not.   04/29/2018  f/u ov/Desmin Daleo re:  Asthma / rhinitis improved with prn zyrtec maint symb/singulair Chief Complaint  Patient presents with  . Follow-up    Pt states things have been doing good since last visit. Pt has had to use her rescue inhaler but states she has not had to use it that often and when she has used it, she states it is only used once in a day's time.    Dyspnea:  MMRC2 = can't walk a nl pace on a flat grade s sob but does fine slow and flat  Cough: varies daytime mostly and dry  SABA use: before exertion sometimes  02: none  rec Work on peDoctor, hospitalechnique:     05/05/2019  f/u ov/Cynthia Cogle re: asthma/ rhinitis maint symb/ singulair  Chief Complaint  Patient presents with  . Follow-up    Breathing overall doing well. She is using her rescue inhaler once per day on average.    Dyspnea:  MMRC2 = can't walk a nl pace on a flat grade s sob but does fine  slow and flat  Cough: minimal Sleeping: ok 2 pillows  SABA use: maybe once a day when exerting  02: none  rec Work on inhaler technique:   05/04/2020  f/u ov/Novalie Leamy re: asthma/ rhinitis not sure about ppi/ using benadryl helps some  Chief Complaint  Patient presents with  . Follow-up    Increased SOB and chest tightness over the past month. She has some cough- prod with min light yellow sputum. She is using her albuterol inhaler about 2 x per day.   Dyspnea:  MMRC2 = can't walk a nl pace on a flat grade s sob but does fine slow and flat  Cough: more cough assoc with pnds day > noct/ not taking omeprazole  Sleeping: 2 pillows ok  SABA use: 2x daily  02: none    No obvious day to day or daytime variability or assoc   mucus plugs or hemoptysis or cp or chest tightness, subjective wheeze or overt sinus or hb symptoms.   Sleeping  without nocturnal  or early am exacerbation  of respiratory  c/o's or need for noct saba. Also denies any  obvious fluctuation of symptoms with weather or environmental changes or other aggravating or alleviating factors except as outlined above   No unusual exposure hx or h/o childhood pna/ asthma or knowledge of premature birth.  Current Allergies, Complete Past Medical History, Past Surgical History, Family History, and Social History were reviewed in [facility] record.  ROS  The following are not active complaints unless bolded Hoarseness, sore throat, dysphagia, dental problems, itching, sneezing,  nasal congestion or discharge of excess mucus or purulent secretions, ear ache,   fever, chills, sweats, unintended wt loss or wt gain, classically pleuritic or exertional cp,  orthopnea pnd or arm/hand swelling  or leg swelling, presyncope, palpitations, abdominal pain, anorexia, nausea, vomiting, diarrhea  or change in bowel habits or change in bladder habits, change in stools or change in urine, dysuria, hematuria,  rash, arthralgias, visual complaints, headache, numbness, weakness or ataxia or problems with walking or coordination,  change in mood or  memory.        Current Meds  -  - NOTE:   Unable to verify as accurately reflecting what pt takes     Medication Sig  . acetaminophen (TYLENOL) 325 MG tablet Take 650 mg by mouth every 6 (six) hours as needed for pain.  Marland Kitchen albuterol (PROAIR HFA) 108 (90 Base) MCG/ACT inhaler Inhale 2 puffs into the lungs every 4 (four) hours as needed for wheezing.  Marland Kitchen amLODipine (NORVASC) 5 MG tablet TAKE 1 TABLET BY MOUTH TWICE A DAY  . Blood Glucose Monitoring Suppl (ONE TOUCH ULTRA 2) W/DEVICE KIT Check blood sugar 3 times per day.  . budesonide-formoterol (SYMBICORT) 160-4.5 MCG/ACT inhaler TAKE 2 PUFFS FIRST THING IN AM AND THEN ANOTHER 2 PUFFS ABOUT 12 HOURS LATER.  Marland Kitchen ELIQUIS 2.5 MG TABS tablet TAKE 1 TABLET BY MOUTH TWICE A DAY  . hydrALAZINE (APRESOLINE) 25 MG tablet Take 1 tablet (25 mg total) by mouth 3 (three) times daily.  . insulin  lispro (HUMALOG) 100 UNIT/ML injection 3 units with breakfast, and 22 units with supper  . Insulin Syringe-Needle U-100 (BD INSULIN SYRINGE ULTRAFINE) 31G X 5/16" 0.3 ML MISC Use as directed   . montelukast (SINGULAIR) 10 MG tablet TAKE 1 TABLET BY MOUTH AT BEDTIME  . nystatin (MYCOSTATIN) 100000 UNIT/ML suspension Take 5 mLs (500,000 Units total) by mouth 4 (four) times daily.  Marland Kitchen omeprazole (  PRILOSEC) 40 MG capsule TAKE 1 CAPSULE BY MOUTH EVERY DAY  . ONE TOUCH LANCETS MISC Use as directed three times daily   . potassium chloride (KLOR-CON) 10 MEQ tablet TAKE 1 TABLET BY MOUTH EVERY DAY  . pravastatin (PRAVACHOL) 40 MG tablet Take 1 tablet (40 mg total) by mouth daily.                  Past Medical History:   OSTEOPOROSIS (ICD-733.00)  HYPERTENSION (ICD-401.9)  HYPERLIPIDEMIA (ICD-272.4)  DIVERTICULITIS, HX OF (ICD-V12.79)  DIABETES MELLITUS, TYPE II (ICD-250.00)  COLON CANCER, HX OF (ICD-V10.05)  ASTHMA (ICD-493.90)  -PFT's 03/18/08 minimal airflow obstruction  -PFT's January 04, 2010 No sign airflow obstruction  -Mastered HFA technique August 13, 2008 > confimed November 23, 2009  ANXIETY (ICD-300.00)  ANEMIA-IRON DEFICIENCY (ICD-280.9)  ALLERGIC RHINITIS (ICD-477.9)  IBS  GERD            Objective:   Physical Exam  05/04/2020    185 05/05/2019    196  wt 202 November 23, 2009> 199 January 04, 2010 > 209  03/22/11 > 214 10/16/2013 >  01/25/2017   193> 01/28/2018   200 >    amb bf nad  Vital signs reviewed  05/04/2020  - Note at rest 02 sats  100% on RA      HEENT : pt wearing mask not removed for exam due to covid -19 concerns.    NECK :  without JVD/Nodes/TM/ nl carotid upstrokes bilaterally   LUNGS: no acc muscle use,  Nl contour chest which is clear to A and P bilaterally without cough on insp or exp maneuvers   CV:  RRR  no s3 or murmur or increase in P2, and no edema   ABD: mod obese/  soft and nontender with nl inspiratory excursion in the supine position. No  bruits or organomegaly appreciated, bowel sounds nl  MS:  Nl gait/ ext warm without deformities, calf tenderness, cyanosis or clubbing No obvious joint restrictions   SKIN: warm and dry without lesions    NEURO:  alert, approp, nl sensorium with  no motor or cerebellar deficits apparent.     I personally reviewed images and agree with radiology impression as follows:   Chest CT 12/30/19  1. No pulmonary emboli or other acute abnormality of the chest. 2. Cardiomegaly. 3. Cholelithiasis. 4. Small hiatal hernia. 5. Diffuse slight thickening of the mucosa of the esophagus, nonspecific.    Assessment:

## 2020-05-04 NOTE — Patient Instructions (Addendum)
Plan A = Automatic = Always=    Symbicort 160 Take 2 puffs first thing in am and then another 2 puffs about 12 hours later.    Work on inhaler technique:  relax and gently blow all the way out then take a nice smooth deep breath back in, triggering the inhaler at same time you start breathing in.  Hold for up to 5 seconds if you can. Blow out thru nose. Rinse and gargle with water when done  Omerprazole 40 mg Take 30-60 min before first meal of the day   Prednisone 10 mg take  4 each am x 2 days,   2 each am x 2 days,  1 each am x 2 days and stop    Plan B = Backup (to supplement plan A, not to replace it) Only use your albuterol inhaler as a rescue medication to be used if you can't catch your breath by resting or doing a relaxed purse lip breathing pattern.  - The less you use it, the better it will work when you need it. - Ok to use the inhaler up to 2 puffs  every 4 hours if you must but call for appointment if use goes up over your usual need - Don't leave home without it !!  (think of it like the spare tire for your car)   Please schedule vaccine today  Please schedule a follow up visit in 6  months but call sooner if needed

## 2020-05-19 ENCOUNTER — Other Ambulatory Visit: Payer: Self-pay | Admitting: Oral Surgery

## 2020-05-19 DIAGNOSIS — K048 Radicular cyst: Secondary | ICD-10-CM | POA: Diagnosis not present

## 2020-05-19 DIAGNOSIS — M2740 Unspecified cyst of jaw: Secondary | ICD-10-CM | POA: Diagnosis not present

## 2020-07-06 ENCOUNTER — Other Ambulatory Visit: Payer: Self-pay | Admitting: Endocrinology

## 2020-07-06 DIAGNOSIS — N183 Chronic kidney disease, stage 3 unspecified: Secondary | ICD-10-CM

## 2020-07-26 DIAGNOSIS — I130 Hypertensive heart and chronic kidney disease with heart failure and stage 1 through stage 4 chronic kidney disease, or unspecified chronic kidney disease: Secondary | ICD-10-CM | POA: Insufficient documentation

## 2020-07-26 DIAGNOSIS — Z48812 Encounter for surgical aftercare following surgery on the circulatory system: Secondary | ICD-10-CM | POA: Insufficient documentation

## 2020-07-29 ENCOUNTER — Other Ambulatory Visit: Payer: Self-pay | Admitting: Internal Medicine

## 2020-07-29 ENCOUNTER — Ambulatory Visit (INDEPENDENT_AMBULATORY_CARE_PROVIDER_SITE_OTHER): Payer: Medicare Other | Admitting: Endocrinology

## 2020-07-29 ENCOUNTER — Encounter: Payer: Self-pay | Admitting: Endocrinology

## 2020-07-29 ENCOUNTER — Encounter (HOSPITAL_COMMUNITY): Payer: Self-pay | Admitting: Emergency Medicine

## 2020-07-29 ENCOUNTER — Other Ambulatory Visit: Payer: Self-pay

## 2020-07-29 ENCOUNTER — Emergency Department (HOSPITAL_COMMUNITY): Payer: Medicare Other

## 2020-07-29 ENCOUNTER — Observation Stay (HOSPITAL_COMMUNITY): Payer: Medicare Other

## 2020-07-29 ENCOUNTER — Inpatient Hospital Stay (HOSPITAL_COMMUNITY)
Admission: EM | Admit: 2020-07-29 | Discharge: 2020-08-03 | DRG: 242 | Disposition: A | Discharge status: Home Health | Payer: Medicare Other | Attending: Internal Medicine | Admitting: Internal Medicine

## 2020-07-29 VITALS — BP 144/60 | HR 42 | Temp 98.2°F | Ht 67.0 in | Wt 188.0 lb

## 2020-07-29 DIAGNOSIS — K219 Gastro-esophageal reflux disease without esophagitis: Secondary | ICD-10-CM | POA: Diagnosis present

## 2020-07-29 DIAGNOSIS — Z794 Long term (current) use of insulin: Secondary | ICD-10-CM

## 2020-07-29 DIAGNOSIS — I4891 Unspecified atrial fibrillation: Secondary | ICD-10-CM | POA: Diagnosis not present

## 2020-07-29 DIAGNOSIS — I130 Hypertensive heart and chronic kidney disease with heart failure and stage 1 through stage 4 chronic kidney disease, or unspecified chronic kidney disease: Secondary | ICD-10-CM | POA: Diagnosis not present

## 2020-07-29 DIAGNOSIS — I160 Hypertensive urgency: Secondary | ICD-10-CM | POA: Diagnosis present

## 2020-07-29 DIAGNOSIS — Z79899 Other long term (current) drug therapy: Secondary | ICD-10-CM | POA: Diagnosis not present

## 2020-07-29 DIAGNOSIS — J441 Chronic obstructive pulmonary disease with (acute) exacerbation: Secondary | ICD-10-CM | POA: Diagnosis not present

## 2020-07-29 DIAGNOSIS — R0609 Other forms of dyspnea: Secondary | ICD-10-CM | POA: Diagnosis present

## 2020-07-29 DIAGNOSIS — E876 Hypokalemia: Secondary | ICD-10-CM | POA: Diagnosis not present

## 2020-07-29 DIAGNOSIS — J45991 Cough variant asthma: Secondary | ICD-10-CM | POA: Diagnosis not present

## 2020-07-29 DIAGNOSIS — N1831 Chronic kidney disease, stage 3a: Secondary | ICD-10-CM | POA: Diagnosis not present

## 2020-07-29 DIAGNOSIS — I480 Paroxysmal atrial fibrillation: Secondary | ICD-10-CM | POA: Diagnosis not present

## 2020-07-29 DIAGNOSIS — I509 Heart failure, unspecified: Secondary | ICD-10-CM | POA: Diagnosis not present

## 2020-07-29 DIAGNOSIS — I5032 Chronic diastolic (congestive) heart failure: Secondary | ICD-10-CM | POA: Diagnosis not present

## 2020-07-29 DIAGNOSIS — J811 Chronic pulmonary edema: Secondary | ICD-10-CM | POA: Diagnosis not present

## 2020-07-29 DIAGNOSIS — Z7901 Long term (current) use of anticoagulants: Secondary | ICD-10-CM | POA: Diagnosis not present

## 2020-07-29 DIAGNOSIS — I5033 Acute on chronic diastolic (congestive) heart failure: Secondary | ICD-10-CM | POA: Diagnosis not present

## 2020-07-29 DIAGNOSIS — J90 Pleural effusion, not elsewhere classified: Secondary | ICD-10-CM | POA: Diagnosis not present

## 2020-07-29 DIAGNOSIS — D631 Anemia in chronic kidney disease: Secondary | ICD-10-CM | POA: Diagnosis present

## 2020-07-29 DIAGNOSIS — E1122 Type 2 diabetes mellitus with diabetic chronic kidney disease: Secondary | ICD-10-CM | POA: Diagnosis present

## 2020-07-29 DIAGNOSIS — M810 Age-related osteoporosis without current pathological fracture: Secondary | ICD-10-CM | POA: Diagnosis present

## 2020-07-29 DIAGNOSIS — I5031 Acute diastolic (congestive) heart failure: Secondary | ICD-10-CM

## 2020-07-29 DIAGNOSIS — Z7951 Long term (current) use of inhaled steroids: Secondary | ICD-10-CM

## 2020-07-29 DIAGNOSIS — I4821 Permanent atrial fibrillation: Secondary | ICD-10-CM | POA: Diagnosis present

## 2020-07-29 DIAGNOSIS — N184 Chronic kidney disease, stage 4 (severe): Secondary | ICD-10-CM | POA: Diagnosis not present

## 2020-07-29 DIAGNOSIS — R001 Bradycardia, unspecified: Secondary | ICD-10-CM

## 2020-07-29 DIAGNOSIS — E785 Hyperlipidemia, unspecified: Secondary | ICD-10-CM | POA: Diagnosis not present

## 2020-07-29 DIAGNOSIS — I5042 Chronic combined systolic (congestive) and diastolic (congestive) heart failure: Secondary | ICD-10-CM | POA: Diagnosis present

## 2020-07-29 DIAGNOSIS — Z959 Presence of cardiac and vascular implant and graft, unspecified: Secondary | ICD-10-CM

## 2020-07-29 DIAGNOSIS — I2729 Other secondary pulmonary hypertension: Secondary | ICD-10-CM | POA: Diagnosis present

## 2020-07-29 DIAGNOSIS — I10 Essential (primary) hypertension: Secondary | ICD-10-CM | POA: Diagnosis not present

## 2020-07-29 DIAGNOSIS — Z20822 Contact with and (suspected) exposure to covid-19: Secondary | ICD-10-CM | POA: Diagnosis present

## 2020-07-29 DIAGNOSIS — N179 Acute kidney failure, unspecified: Secondary | ICD-10-CM | POA: Diagnosis not present

## 2020-07-29 DIAGNOSIS — R0602 Shortness of breath: Secondary | ICD-10-CM | POA: Diagnosis present

## 2020-07-29 DIAGNOSIS — I442 Atrioventricular block, complete: Principal | ICD-10-CM | POA: Diagnosis present

## 2020-07-29 DIAGNOSIS — I517 Cardiomegaly: Secondary | ICD-10-CM | POA: Diagnosis not present

## 2020-07-29 DIAGNOSIS — Z90721 Acquired absence of ovaries, unilateral: Secondary | ICD-10-CM | POA: Diagnosis not present

## 2020-07-29 DIAGNOSIS — I2510 Atherosclerotic heart disease of native coronary artery without angina pectoris: Secondary | ICD-10-CM | POA: Diagnosis present

## 2020-07-29 DIAGNOSIS — Z87891 Personal history of nicotine dependence: Secondary | ICD-10-CM

## 2020-07-29 DIAGNOSIS — I110 Hypertensive heart disease with heart failure: Secondary | ICD-10-CM | POA: Diagnosis not present

## 2020-07-29 LAB — BASIC METABOLIC PANEL
Anion gap: 11 (ref 5–15)
BUN: 27 mg/dL — ABNORMAL HIGH (ref 8–23)
CO2: 17 mmol/L — ABNORMAL LOW (ref 22–32)
Calcium: 9.1 mg/dL (ref 8.9–10.3)
Chloride: 114 mmol/L — ABNORMAL HIGH (ref 98–111)
Creatinine, Ser: 2.01 mg/dL — ABNORMAL HIGH (ref 0.44–1.00)
GFR, Estimated: 24 mL/min — ABNORMAL LOW (ref >60)
Glucose, Bld: 163 mg/dL — ABNORMAL HIGH (ref 70–99)
Potassium: 4 mmol/L (ref 3.5–5.1)
Sodium: 142 mmol/L (ref 135–145)

## 2020-07-29 LAB — ECHOCARDIOGRAM COMPLETE
AR max vel: 1.45 cm2
AV Area VTI: 1.42 cm2
AV Area mean vel: 1.6 cm2
AV Mean grad: 6 mmHg
AV Peak grad: 17.5 mmHg
Ao pk vel: 2.09 m/s
Area-P 1/2: 2.68 cm2
Height: 67 in
S' Lateral: 2.6 cm
Weight: 3008.84 oz

## 2020-07-29 LAB — CBC
HCT: 31.5 % — ABNORMAL LOW (ref 36.0–46.0)
Hemoglobin: 10.6 g/dL — ABNORMAL LOW (ref 12.0–15.0)
MCH: 27.6 pg (ref 26.0–34.0)
MCHC: 33.7 g/dL (ref 30.0–36.0)
MCV: 82 fL (ref 80.0–100.0)
Platelets: 162 10*3/uL (ref 150–400)
RBC: 3.84 MIL/uL — ABNORMAL LOW (ref 3.87–5.11)
RDW: 15.9 % — ABNORMAL HIGH (ref 11.5–15.5)
WBC: 8.4 10*3/uL (ref 4.0–10.5)
nRBC: 0 % (ref 0.0–0.2)

## 2020-07-29 LAB — CBG MONITORING, ED: Glucose-Capillary: 170 mg/dL — ABNORMAL HIGH (ref 70–99)

## 2020-07-29 LAB — GLUCOSE, CAPILLARY: Glucose-Capillary: 233 mg/dL — ABNORMAL HIGH (ref 70–99)

## 2020-07-29 LAB — BRAIN NATRIURETIC PEPTIDE: B Natriuretic Peptide: 371.9 pg/mL — ABNORMAL HIGH (ref 0.0–100.0)

## 2020-07-29 LAB — RESPIRATORY PANEL BY RT PCR (FLU A&B, COVID)
Influenza A by PCR: NEGATIVE
Influenza B by PCR: NEGATIVE
SARS Coronavirus 2 by RT PCR: NEGATIVE

## 2020-07-29 LAB — TSH: TSH: 3.47 u[IU]/mL (ref 0.35–4.50)

## 2020-07-29 LAB — TROPONIN I (HIGH SENSITIVITY)
Troponin I (High Sensitivity): 8 ng/L (ref <18)
Troponin I (High Sensitivity): 11 ng/L (ref <18)

## 2020-07-29 LAB — T4, FREE: Free T4: 0.99 ng/dL (ref 0.60–1.60)

## 2020-07-29 MED ORDER — ATROPINE SULFATE 1 MG/10ML IJ SOSY: 1.0000 mg | PREFILLED_SYRINGE | INTRAMUSCULAR | Status: DC | PRN

## 2020-07-29 MED ORDER — FUROSEMIDE 10 MG/ML IJ SOLN
40.0000 mg | Freq: Once | INTRAMUSCULAR | Status: AC
  Administered 2020-07-29: 40 mg via INTRAVENOUS
  Filled 2020-07-29: qty 4

## 2020-07-29 MED ORDER — ACETAMINOPHEN 650 MG RE SUPP: 650.0000 mg | Freq: Four times a day (QID) | RECTAL | Status: DC | PRN

## 2020-07-29 MED ORDER — INSULIN LISPRO 100 UNIT/ML ~~LOC~~ SOLN
5.0000 [IU] | SUBCUTANEOUS | Status: DC
Start: 2020-07-29 — End: 2020-07-29

## 2020-07-29 MED ORDER — GUAIFENESIN ER 600 MG PO TB12
600.0000 mg | ORAL_TABLET | Freq: Two times a day (BID) | ORAL | Status: DC
  Administered 2020-07-29 – 2020-08-03 (×11): 600 mg via ORAL
  Filled 2020-07-29 (×11): qty 1

## 2020-07-29 MED ORDER — OXYCODONE HCL 5 MG PO TABS
5.0000 mg | ORAL_TABLET | ORAL | Status: DC | PRN
  Administered 2020-08-02 – 2020-08-03 (×3): 5 mg via ORAL
  Filled 2020-07-29 (×3): qty 1

## 2020-07-29 MED ORDER — PRAVASTATIN SODIUM 40 MG PO TABS
40.0000 mg | ORAL_TABLET | Freq: Every day | ORAL | Status: DC
  Administered 2020-07-30 – 2020-08-03 (×5): 40 mg via ORAL
  Filled 2020-07-29 (×5): qty 1

## 2020-07-29 MED ORDER — HYDRALAZINE HCL 25 MG PO TABS
25.0000 mg | ORAL_TABLET | Freq: Three times a day (TID) | ORAL | Status: DC
  Administered 2020-07-29: 25 mg via ORAL
  Filled 2020-07-29: qty 1

## 2020-07-29 MED ORDER — MONTELUKAST SODIUM 10 MG PO TABS
10.0000 mg | ORAL_TABLET | Freq: Every day | ORAL | Status: DC
  Administered 2020-07-29 – 2020-08-02 (×5): 10 mg via ORAL
  Filled 2020-07-29 (×6): qty 1

## 2020-07-29 MED ORDER — ALBUTEROL SULFATE (2.5 MG/3ML) 0.083% IN NEBU
2.5000 mg | INHALATION_SOLUTION | Freq: Four times a day (QID) | RESPIRATORY_TRACT | Status: DC
  Administered 2020-07-29: 2.5 mg via RESPIRATORY_TRACT
  Filled 2020-07-29: qty 3

## 2020-07-29 MED ORDER — METHYLPREDNISOLONE SODIUM SUCC 125 MG IJ SOLR
60.0000 mg | Freq: Two times a day (BID) | INTRAMUSCULAR | Status: AC
  Administered 2020-07-29 – 2020-07-31 (×4): 60 mg via INTRAVENOUS
  Filled 2020-07-29 (×4): qty 2

## 2020-07-29 MED ORDER — PANTOPRAZOLE SODIUM 40 MG PO TBEC
40.0000 mg | DELAYED_RELEASE_TABLET | Freq: Every day | ORAL | Status: DC
  Administered 2020-07-30 – 2020-08-03 (×5): 40 mg via ORAL
  Filled 2020-07-29 (×5): qty 1

## 2020-07-29 MED ORDER — SODIUM CHLORIDE 0.9 % IV SOLN: 250.0000 mL | INTRAVENOUS | Status: DC | PRN

## 2020-07-29 MED ORDER — ALBUTEROL SULFATE HFA 108 (90 BASE) MCG/ACT IN AERS
4.0000 | INHALATION_SPRAY | Freq: Once | RESPIRATORY_TRACT | Status: AC
  Administered 2020-07-29: 4 via RESPIRATORY_TRACT
  Filled 2020-07-29: qty 6.7

## 2020-07-29 MED ORDER — MOMETASONE FURO-FORMOTEROL FUM 200-5 MCG/ACT IN AERO
2.0000 | INHALATION_SPRAY | Freq: Two times a day (BID) | RESPIRATORY_TRACT | Status: DC
  Administered 2020-07-30 – 2020-08-03 (×9): 2 via RESPIRATORY_TRACT
  Filled 2020-07-29: qty 8.8

## 2020-07-29 MED ORDER — ACETAMINOPHEN 325 MG PO TABS
650.0000 mg | ORAL_TABLET | Freq: Four times a day (QID) | ORAL | Status: DC | PRN
  Administered 2020-07-29 – 2020-08-01 (×6): 650 mg via ORAL
  Filled 2020-07-29 (×6): qty 2

## 2020-07-29 MED ORDER — IPRATROPIUM BROMIDE 0.02 % IN SOLN: 0.5000 mg | Freq: Two times a day (BID) | RESPIRATORY_TRACT | Status: DC

## 2020-07-29 MED ORDER — IPRATROPIUM BROMIDE 0.02 % IN SOLN
0.5000 mg | Freq: Four times a day (QID) | RESPIRATORY_TRACT | Status: DC
  Administered 2020-07-29: 0.5 mg via RESPIRATORY_TRACT
  Filled 2020-07-29: qty 2.5

## 2020-07-29 MED ORDER — ALBUTEROL SULFATE (2.5 MG/3ML) 0.083% IN NEBU: 2.5000 mg | INHALATION_SOLUTION | Freq: Two times a day (BID) | RESPIRATORY_TRACT | Status: DC

## 2020-07-29 MED ORDER — ALBUTEROL SULFATE HFA 108 (90 BASE) MCG/ACT IN AERS
2.0000 | INHALATION_SPRAY | RESPIRATORY_TRACT | Status: DC | PRN
  Filled 2020-07-29: qty 6.7

## 2020-07-29 MED ORDER — FUROSEMIDE 10 MG/ML IJ SOLN: 40.0000 mg | Freq: Every day | INTRAMUSCULAR | Status: DC

## 2020-07-29 MED ORDER — SODIUM CHLORIDE 0.9% FLUSH: 3.0000 mL | INTRAVENOUS | Status: DC | PRN

## 2020-07-29 MED ORDER — METHYLPREDNISOLONE SODIUM SUCC 125 MG IJ SOLR
125.0000 mg | Freq: Once | INTRAMUSCULAR | Status: AC
  Administered 2020-07-29: 125 mg via INTRAVENOUS
  Filled 2020-07-29: qty 2

## 2020-07-29 MED ORDER — AMLODIPINE BESYLATE 5 MG PO TABS
5.0000 mg | ORAL_TABLET | Freq: Every day | ORAL | Status: DC
  Administered 2020-07-30 – 2020-08-02 (×4): 5 mg via ORAL
  Filled 2020-07-29 (×5): qty 1

## 2020-07-29 MED ORDER — FUROSEMIDE 10 MG/ML IJ SOLN
40.0000 mg | Freq: Every day | INTRAMUSCULAR | Status: DC
  Administered 2020-07-29 – 2020-07-30 (×2): 40 mg via INTRAVENOUS
  Filled 2020-07-29 (×2): qty 4

## 2020-07-29 MED ORDER — LORATADINE 10 MG PO TABS
10.0000 mg | ORAL_TABLET | Freq: Every day | ORAL | Status: DC
  Administered 2020-07-29 – 2020-08-03 (×6): 10 mg via ORAL
  Filled 2020-07-29 (×6): qty 1

## 2020-07-29 MED ORDER — INSULIN ASPART 100 UNIT/ML ~~LOC~~ SOLN
4.0000 [IU] | Freq: Once | SUBCUTANEOUS | Status: AC
  Administered 2020-07-29: 4 [IU] via SUBCUTANEOUS

## 2020-07-29 MED ORDER — APIXABAN 2.5 MG PO TABS
2.5000 mg | ORAL_TABLET | Freq: Two times a day (BID) | ORAL | Status: DC
  Administered 2020-07-29 – 2020-08-01 (×7): 2.5 mg via ORAL
  Filled 2020-07-29 (×8): qty 1

## 2020-07-29 MED ORDER — INSULIN ASPART 100 UNIT/ML ~~LOC~~ SOLN
0.0000 [IU] | Freq: Three times a day (TID) | SUBCUTANEOUS | Status: DC
  Administered 2020-07-29 – 2020-07-30 (×2): 3 [IU] via SUBCUTANEOUS
  Administered 2020-07-30: 5 [IU] via SUBCUTANEOUS
  Administered 2020-07-30 – 2020-07-31 (×2): 8 [IU] via SUBCUTANEOUS
  Administered 2020-07-31: 3 [IU] via SUBCUTANEOUS
  Administered 2020-07-31: 5 [IU] via SUBCUTANEOUS
  Administered 2020-08-01: 3 [IU] via SUBCUTANEOUS
  Administered 2020-08-01: 2 [IU] via SUBCUTANEOUS
  Administered 2020-08-01: 5 [IU] via SUBCUTANEOUS
  Administered 2020-08-02 (×2): 3 [IU] via SUBCUTANEOUS
  Administered 2020-08-02: 2 [IU] via SUBCUTANEOUS
  Administered 2020-08-03: 5 [IU] via SUBCUTANEOUS
  Administered 2020-08-03: 2 [IU] via SUBCUTANEOUS
  Administered 2020-08-03: 5 [IU] via SUBCUTANEOUS

## 2020-07-29 MED ORDER — IPRATROPIUM BROMIDE HFA 17 MCG/ACT IN AERS
2.0000 | INHALATION_SPRAY | Freq: Once | RESPIRATORY_TRACT | Status: AC
  Administered 2020-07-29: 2 via RESPIRATORY_TRACT
  Filled 2020-07-29: qty 12.9

## 2020-07-29 MED ORDER — SODIUM CHLORIDE 0.9% FLUSH
3.0000 mL | Freq: Two times a day (BID) | INTRAVENOUS | Status: DC
  Administered 2020-07-29 – 2020-08-03 (×11): 3 mL via INTRAVENOUS

## 2020-07-29 MED ORDER — ATROPINE SULFATE 1 MG/10ML IJ SOSY
1.0000 mg | PREFILLED_SYRINGE | INTRAMUSCULAR | Status: AC | PRN
  Administered 2020-07-29 – 2020-07-30 (×3): 1 mg via INTRAVENOUS
  Filled 2020-07-29 (×3): qty 10

## 2020-07-29 MED ORDER — HYDRALAZINE HCL 50 MG PO TABS
50.0000 mg | ORAL_TABLET | Freq: Three times a day (TID) | ORAL | Status: DC
  Administered 2020-07-29 – 2020-07-30 (×2): 50 mg via ORAL
  Filled 2020-07-29 (×2): qty 1

## 2020-07-29 NOTE — H&P (Signed)
History and Physical    [name] NTI:[id number] DOB: 21-Apr-1936 DOA: 07/29/2020  PCP: [name], FNP    Patient coming from: Home   Chief Complaint: SOB   HPI: [name] is a 84 y.o. female with medical history significant of SOB.  Patient is noted to have a past medical history of hypertension, chronic diastolic heart failure with last echocardiogram done in April of this year showing a ejection fraction of 60 to 65%, with severe left atrial enlargement and right atrial enlargement.  Patient also has past medical history of diabetes mellitus type 2, anemia of chronic disease, chronic kidney disease, asthma.   ED Course: Blood pressure (!) 196/59, pulse (!) 40, temperature 98.4 F (36.9 C), temperature source Oral, resp. rate 20, height _0  (1.702 m), weight 85.3 kg, SpO2 99 %. Initial vitals show patient is afebrile heart rate of 42 O2 sats of 98% on room air patient is not requiring supplemental oxygen.  Initial imaging with chest x-ray does show pulmonary vascular congestion.  Initial blood work, CMP shows a serum glucose of 163, creatinine of 2.01, with EGFR of 24, which has increased from her prior of 1.79, BNP found elevated at 371.9, troponin of 8.  Prior iron study done in April shows a serum iron of 38 which is low below reference range, TIBC of 259 which is normal, percent saturation of 15, for serum ferritin of 97 which is normal more indicative of anemia of chronic disease.  CBC today shows a hemoglobin of 10.6, MCV of 82, mildly elevated RDW of 15.9 which is improved from April.  Patient sees Dr. Dorris Carnes who is her cardiology MD.  Review of Systems: As per HPI otherwise all systems reviewed and negative.  Past Medical History:  Diagnosis Date   Allergic rhinitis    Anterior chest wall pain    Anxiety    Asthma    Chronic diastolic CHF (congestive heart failure) ([place])    Echo 01/2020: EF 60-65, normal wall motion, mild LVH, normal RV SF, RVSP  52.6 (moderate elevation), severe LAE, moderate RAE, trivial MR, mild MS (mean gradient 5.5 mmHg), mild aortic valve sclerosis (no AS); elevated E/e' c/w elevated LVEDP   Cough    DM type 2 (diabetes mellitus, type 2) (H[facility])    GERD (gastroesophageal reflux disease)    History of diverticulitis of colon    HTN (hypertension)    Hyperlipidemia    IBS (irritable bowel syndrome)    Iron deficiency anemia    Osteoporosis, unspecified    Renal insufficiency 07/31/2017   UTI (urinary tract infection)     Past Surgical History:  Procedure Laterality Date   CARDIOVASCULAR STRESS TEST  02/25/04   ESOPHAGOGASTRODUODENOSCOPY  12/26/01   RIGHT OOPHORECTOMY  jan 2010     reports that she quit smoking about 33 years ago. Her smoking use included cigarettes. She has a 1.50 pack-year smoking history. She has never used smokeless tobacco. She reports that she does not drink alcohol and does not use drugs.  Allergies  Allergen Reactions   Aspirin Shortness Of Breath and Other (See Comments)    Caused asthma symptoms   Ramipril Cough    Family History  Problem Relation Age of Onset   Lung cancer Brother    Melanoma Brother    Hypertension Mother    Stroke Mother    Other Father        poor circulation   Atopy Neg Hx  Asthma Neg Hx    Breast cancer Neg Hx     Prior to Admission medications   Medication Sig Start Date End Date Taking? Authorizing Provider  acetaminophen (TYLENOL) 325 MG tablet Take 650 mg by mouth every 6 (six) hours as needed for pain.    [provider]  albuterol (PROAIR HFA) 108 (90 Base) MCG/ACT inhaler Inhale 2 puffs into the lungs every 4 (four) hours as needed for wheezing. 05/05/19 05/04/20  Tanda Rockers, MD  amLODipine (NORVASC) 5 MG tablet TAKE 1 TABLET BY MOUTH TWICE A DAY 12/29/19   Fay Records, MD  Blood Glucose Monitoring Suppl (ONE TOUCH ULTRA 2) W/DEVICE KIT Check blood sugar 3 times per day. 07/24/14   Renato Shin, MD   budesonide-formoterol (SYMBICORT) 160-4.5 MCG/ACT inhaler TAKE 2 PUFFS FIRST THING IN AM AND THEN ANOTHER 2 PUFFS ABOUT 12 HOURS LATER. 04/15/20   Tanda Rockers, MD  cetirizine (ZYRTEC ALLERGY) 10 MG tablet Take 1 tablet (10 mg total) by mouth daily as needed for allergies. 05/04/20   Tanda Rockers, MD  ELIQUIS 2.5 MG TABS tablet TAKE 1 TABLET BY MOUTH TWICE A DAY 07/29/20   Fay Records, MD  furosemide (LASIX) 40 MG tablet Take 1 tablet (40 mg total) by mouth daily. 01/23/20 04/22/20  Richardson Dopp T, PA-C  hydrALAZINE (APRESOLINE) 25 MG tablet Take 1 tablet (25 mg total) by mouth 3 (three) times daily. 03/10/20   Kathlen Mody, Scott T, PA-C  insulin lispro (HUMALOG) 100 UNIT/ML injection 5 UNITS WITH BREAKFAST, AND 25 UNITS WITH SUPPER 07/06/20   Renato Shin, MD  Insulin Syringe-Needle U-100 (BD INSULIN SYRINGE ULTRAFINE) 31G X 5/16" 0.3 ML MISC Use as directed     [provider]  losartan (COZAAR) 100 MG tablet Take 1 tablet (100 mg total) by mouth daily. 01/23/20 04/22/20  Richardson Dopp T, PA-C  montelukast (SINGULAIR) 10 MG tablet TAKE 1 TABLET BY MOUTH AT BEDTIME 12/15/19   Tanda Rockers, MD  nystatin (MYCOSTATIN) 100000 UNIT/ML suspension Take 5 mLs (500,000 Units total) by mouth 4 (four) times daily. 04/30/20   [name], FNP  omeprazole (PRILOSEC) 40 MG capsule Take 1 capsule (40 mg total) by mouth daily. 05/04/20   Tanda Rockers, MD  ONE TOUCH LANCETS MISC Use as directed three times daily     [provider]  potassium chloride (KLOR-CON) 10 MEQ tablet TAKE 1 TABLET BY MOUTH EVERY DAY 12/16/19   Fay Records, MD  pravastatin (PRAVACHOL) 40 MG tablet Take 1 tablet (40 mg total) by mouth daily. 08/27/19   [name], FNP  predniSONE (DELTASONE) 10 MG tablet Take  4 each am x 2 days,   2 each am x 2 days,  1 each am x 2 days and stop 05/04/20   Tanda Rockers, MD    Physical Exam: Vitals:   07/29/20 1119 07/29/20 1121 07/29/20 1345 07/29/20 1400  BP:   (!) 186/70 (!) 186/72 (!) 196/59  Pulse: (!) 42  (!) 40 (!) 40  Resp: 18  (!) 22 20  Temp: 98.4 F (36.9 C)     TempSrc: Oral     SpO2: 98%  99% 99%  Weight:      Height:        Constitutional: NAD, calm, comfortable Vitals:   07/29/20 1119 07/29/20 1121 07/29/20 1345 07/29/20 1400  BP:  (!) 186/70 (!) 186/72 (!) 196/59  Pulse: (!) 42  (!) 40 (!) 40  Resp: 18  (!) 22 20  Temp: 98.4 F (36.9 C)     TempSrc: Oral     SpO2: 98%  99% 99%  Weight:      Height:       Eyes: PERRL, EOMI lids and conjunctivae normal ENMT: Mucous membranes are moist. Posterior pharynx clear of any exudate or lesions.Normal dentition.  Neck: normal, supple, no masses, no thyromegaly, no carotid bruit  Respiratory: clear to auscultation bilaterally, no wheezing, no crackles. Normal respiratory effort. No accessory muscle use.  Cardiovascular: Irregular rate and rhythm, no murmurs / rubs / gallops. No extremity edema. 2+ pedal pulses. No carotid bruits.  Abdomen: no tenderness, no masses palpated. No hepatosplenomegaly. Bowel sounds positive.  Musculoskeletal: no clubbing / cyanosis. No joint deformity upper and lower extremities. Pt moving all four ext , no contractures. Normal muscle tone.  Skin: no rashes, lesions, ulcers. No induration Neurologic: CN 2-12 grossly intact.  Strength 5/5 in all 4.  Psychiatric: Normal judgment and insight. Alert and oriented x 3. Normal mood.   Labs on Admission: I have personally reviewed following labs and imaging studies  CBC: Recent Labs  Lab 07/29/20 1121  WBC 8.4  HGB 10.6*  HCT 31.5*  MCV 82.0  PLT 240   Basic Metabolic Panel: Recent Labs  Lab 07/29/20 1121  NA 142  K 4.0  CL 114*  CO2 17*  GLUCOSE 163*  BUN 27*  CREATININE 2.01*  CALCIUM 9.1   GFR: Estimated Creatinine Clearance: 24.2 mL/min (A) (by C-G formula based on SCr of 2.01 mg/dL (H)). Liver Function Tests: No results for input(s): AST, ALT, ALKPHOS, BILITOT, PROT, ALBUMIN in the  last 168 hours. No results for input(s): LIPASE, AMYLASE in the last 168 hours. No results for input(s): AMMONIA in the last 168 hours. Coagulation Profile: No results for input(s): INR, PROTIME in the last 168 hours. Cardiac Enzymes: No results for input(s): CKTOTAL, CKMB, CKMBINDEX, TROPONINI in the last 168 hours. BNP (last 3 results) No results for input(s): PROBNP in the last 8760 hours. HbA1C: No results for input(s): HGBA1C in the last 72 hours. CBG: No results for input(s): GLUCAP in the last 168 hours. Lipid Profile: No results for input(s): CHOL, HDL, LDLCALC, TRIG, CHOLHDL, LDLDIRECT in the last 72 hours. Thyroid Function Tests: No results for input(s): TSH, T4TOTAL, FREET4, T3FREE, THYROIDAB in the last 72 hours. Anemia Panel: No results for input(s): VITAMINB12, FOLATE, FERRITIN, TIBC, IRON, RETICCTPCT in the last 72 hours. Urine analysis:    Component Value Date/Time   COLORURINE YELLOW 12/19/2019 1800   APPEARANCEUR CLEAR 12/19/2019 1800   LABSPEC 1.025 12/19/2019 1800   PHURINE 5.0 12/19/2019 1800   GLUCOSEU NEGATIVE 12/19/2019 1800   GLUCOSEU NEGATIVE 06/01/2017 0908   HGBUR NEGATIVE 12/19/2019 1800   HGBUR negative 09/28/2007 0933   BILIRUBINUR SMALL (A) 12/19/2019 1800   KETONESUR 15 (A) 12/19/2019 1800   PROTEINUR 30 (A) 12/19/2019 1800   UROBILINOGEN 0.2 06/01/2017 0908   NITRITE NEGATIVE 12/19/2019 1800   LEUKOCYTESUR TRACE (A) 12/19/2019 1800   No intake or output data in the 24 hours ending 07/29/20 1436 Lab Results  Component Value Date   CREATININE 2.01 (H) 07/29/2020   CREATININE 1.79 (H) 01/30/2020   CREATININE 1.97 (H) 01/06/2020    COVID-19 Labs  No results for input(s): DDIMER, FERRITIN, LDH, CRP in the last 72 hours.  Lab Results  Component Value Date   [place] NEGATIVE 12/19/2019    Radiological Exams on Admission: DG Chest 2  View  Result Date: 07/29/2020 CLINICAL DATA:  Shortness of breath. EXAM: CHEST - 2 VIEW  COMPARISON:  None. FINDINGS: The cardio pericardial silhouette is enlarged. There is pulmonary vascular congestion without overt pulmonary edema. No focal airspace consolidation. The visualized bony structures of the thorax show no acute abnormality. IMPRESSION: Enlarged cardiopericardial silhouette with pulmonary vascular congestion. Possible interstitial edema. Electronically Signed   By: Misty Stanley M.D.   On: 07/29/2020 12:11    EKG: Independently reviewed.  Atrial fibrillation heart rate of 43.  Assessment/Plan:  Shortness of breath: A) CHF: D/D include asthma/ COPD exacerbation, CHF exacerbation. We will therefore admit pt to cardiac telemetry to monitor closely her heart rhythm. Pt has received lasix in er as CXR showed pulmonary vascular congestion ,and AM MD to resume po lasix after evaluating pt in am for renal and volume status.For Her cardiac history we will continue to trend her troponin. Strict I/O, daily weights, BNP is mildly high but we will repeat in am. Thyroid function test and electrolytes. 2 d Echo and cardiology consult.  Lasix to be started as needed.Per GDMT pt cannot receive BB due to her bradycardia. Pt is also limited with ACE/ARB due to AKI on CKD. Will place in observation status with telemetry, as there are no current findings necessitating admission (hemodynamic instability, severe electrolyte abnormalities, cardiac arrhythmias, ACS, severe pulmonary edema requiring new O2 therapy, AMS).  B) Asthma VS COPD as pt is a former smoker: We will continue with solumedrol and pt to be changed to taper in one to two days per AM MD.Supplemental oxygen to keep o2 sats above 88%.  Albuterol/ ipratropium neb and albuterol MDI. IV ppi as pt has Gerd variant asthma history   Bradycardia: We will obtain cardiology consult and place pacer pads and atropine at bedside.  Thyroid studies.  HTN: BP is stable, home regimen with hydralazine and amlodipine continued. Low  sodium cardiac diet.   Atrial Fibrillation with bradycardia: Pt is on Eliquis for her cha2ds2vasc score is 5 putting pt at risk of about 6.7 to 7.2 for VTE.     Condition Points  C  Congestive heart failure (or Left ventricular systolic dysfunction) 1  H Hypertension: blood pressure consistently above 140/90 mmHg (or treated hypertension on medication) 1  A2 Age ?30 years 2  D Diabetes Mellitus 1  S2 Prior Stroke or TIA or thromboembolism 2  V Vascular disease (e.g. peripheral artery disease, myocardial infarction, aortic plaque) 1  A Age 60-74 years 1  [place] Sex category (i.e. female sex) 1    Thus, the CHA2DS2-VASc[7][8][9] score is a refinement of CHADS2[10][11] score and extends the latter by including additional common stroke risk factors, that is, age 28-74, female gender and vascular disease. In the CHA2DS2-VASc score, 'age 47 and above' also has extra weight, with 2 points.  The maximum CHADS2 score is 6, whilst the maximum CHA2DS2-VASc score is 9 (for age, either the patient is ?53 years and gets two points, is between 23-74 and gets one point, or is under 65 and does not get points).   Annual Stroke Risk[12]    CHA2DS2-VASc Score Stroke Risk % 95% CI  0 0 -  1 1.3 -  2 2.2 -  3 3.2 -  4 4.0 -  5 6.7 -  6 9.8 -  7 9.6 -  8 12.5 -  9 15.2 -    AKI on CKD: Lab Results  Component Value Date   CREATININE 2.01 (H) 07/29/2020  CREATININE 1.79 (H) 01/30/2020   CREATININE 1.97 (H) 01/06/2020  We will monitor for renal function. Avoid nephrotoxic agents contrast and renally dose meds. Avoid ACE/ARB.  DMII: SSI, a1c, hypoglycemia protocol, held home insulin and cont on ssi to prevent hypoglycemia.   DVT prophylaxis:  Eliquis.  Code Status:  Full Code   Family Communication:  Daughter [name]  [phone]   Disposition Plan:  Home   Consults called:  Cardiology- [facility]  Admission status: Observation.  The patient remains OBS appropriate and will d/c  before 2 midnights.  Dispo: The patient is from: Home              Anticipated d/c is to: Home              Anticipated d/c date is: 2 days              Patient currently is not medically stable to d/c.  Para Skeans MD [facility] Pager [phone] If 7PM-7AM, please contact night-coverage [email or web address] Password [email or web address] 07/29/2020, 2:36 PM

## 2020-07-29 NOTE — Telephone Encounter (Signed)
Prescription refill request for Eliquis received.   Last office visit: [name] 02/17/2020 Scr: 1.79, 01/30/2020 Age: 84 y.o. Weight: 83.9 kg   Prescription refill sent.

## 2020-07-29 NOTE — ED Triage Notes (Signed)
Pt send by pcp for further evaluation for palpitation and SOB.

## 2020-07-29 NOTE — Progress Notes (Signed)
PT Cancellation Note  Patient Details Name: [name] MRN: [id number] DOB: 11-Dec-1935   Cancelled Treatment:    Reason Eval/Treat Not Completed: Medical issues which prohibited therapy patient still quite hypertensive and with HR in the 40s Not appropriate for PT at the moment- will initiate eval when medically ready.    Windell Norfolk, DPT, PN1   Supplemental Physical Therapist [facility]    Pager [phone] Acute Rehab Office [phone]

## 2020-07-29 NOTE — Consult Note (Addendum)
Cardiology Consultation:   Patient ID: [name] MRN: [id number]; DOB: 14-Jan-1936  Admit date: 07/29/2020 Date of Consult: 07/29/2020  Primary Care Provider: [name], [facility] Cardiologist: Dorris Carnes, MD   Patient Profile:   [name] is a 84 y.o. female with a hx of permanent atrial fibrillation, CAD, chronic diastolic CHF, DM, HLD, HTN and lung nodules  who is being seen today for the evaluation of bradycardia at the request of Dr. Posey Pronto.   Hx of medically managed Coronary artery disease: -  Myoview 07/2016: Medium size, moderate severity reversible anterior perfusion defect suggestive of ischemia. LVEF 73% with normal wall motion. This is an intermediate risk study. - Coronary CTA 08/2016: Calcium score of 51. Moderate CAD in the proximal and mid LAD and possible severe stenosis in the ostial D2. Negative FFR. Medical therapy recommended.   Echo 01/2020: LVEF 60-65%, elevated LVEDP. Stopped HCTZ and started on lasix.   History of Present Illness:   [name] sent from PCP office for evaluation of shortness of breath and bradycardia.  Patient reported shortness of breath for past couple of weeks to months.  Her shortness of breath mostly occurs with exertion.  No associated chest tightness or pressure.  Unable to tell if recently worsened or not.  No orthopnea but sleeps on 2 pillows chronically.  Mild intermittent lower extremity edema.  Endorses high salt in diet.  Compliant with diuretics.  Never seen by nephrologist.  Blood pressure 196/59.  Reports compliance with antihypertensive.  No palpitation, dizziness, syncope or melena.  Chest x-ray with pulmonary vascular congestion and possible interstitial edema.  BNP 371.9 Scr 2.01 (baseline Scr 1.7-2.0) K 4.0 Respiratory panel negative for COVID and influenza Echo done, pending reading    Past Medical History:  Diagnosis Date  . Allergic rhinitis   . Anterior chest wall pain   . Anxiety   . Asthma    . Chronic diastolic CHF (congestive heart failure) (HCC)    Echo 01/2020: EF 60-65, normal wall motion, mild LVH, normal RV SF, RVSP 52.6 (moderate elevation), severe LAE, moderate RAE, trivial MR, mild MS (mean gradient 5.5 mmHg), mild aortic valve sclerosis (no AS); elevated E/e' c/w elevated LVEDP  . Cough   . DM type 2 (diabetes mellitus, type 2) ([place])   . GERD (gastroesophageal reflux disease)   . History of diverticulitis of colon   . HTN (hypertension)   . Hyperlipidemia   . IBS (irritable bowel syndrome)   . Iron deficiency anemia   . Osteoporosis, unspecified   . Renal insufficiency 07/31/2017  . UTI (urinary tract infection)     Past Surgical History:  Procedure Laterality Date  . CARDIOVASCULAR STRESS TEST  02/25/04  . ESOPHAGOGASTRODUODENOSCOPY  12/26/01  . RIGHT OOPHORECTOMY  jan 2010     Inpatient Medications: Scheduled Meds: . albuterol  2.5 mg Nebulization Q6H  . [START ON 07/30/2020] amLODipine  5 mg Oral Daily  . apixaban  2.5 mg Oral BID  . guaiFENesin  600 mg Oral BID  . hydrALAZINE  25 mg Oral TID  . insulin aspart  0-15 Units Subcutaneous TID WC  . ipratropium  0.5 mg Nebulization Q6H  . loratadine  10 mg Oral Daily  . methylPREDNISolone (SOLU-MEDROL) injection  60 mg Intravenous Q12H  . mometasone-formoterol  2 puff Inhalation BID  . montelukast  10 mg Oral QHS  . [START ON 07/30/2020] pantoprazole  40 mg Oral Daily  . [START ON 07/30/2020] pravastatin  40  mg Oral Daily  . sodium chloride flush  3 mL Intravenous Q12H   Continuous Infusions: . sodium chloride     PRN Meds: sodium chloride, acetaminophen **OR** acetaminophen, albuterol, atropine, oxyCODONE, sodium chloride flush  Allergies:    Allergies  Allergen Reactions  . Aspirin Shortness Of Breath and Other (See Comments)    Caused asthma symptoms  . Ramipril Cough    Social History:   Social History   Socioeconomic History  . Marital status: Widowed    Spouse name: Not on file  .  Number of children: Not on file  . Years of education: Not on file  . Highest education level: Not on file  Occupational History  . Occupation: retired  Tobacco Use  . Smoking status: Former Smoker    Packs/day: 0.30    Years: 5.00    Pack years: 1.50    Types: Cigarettes    Quit date: 10/02/1986    Years since quitting: 33.8  . Smokeless tobacco: Never Used  Vaping Use  . Vaping Use: Never used  Substance and Sexual Activity  . Alcohol use: No  . Drug use: No  . Sexual activity: Not on file  Other Topics Concern  . Not on file  Social History Narrative  . Not on file   Social Determinants of Health   Financial Resource Strain:   . Difficulty of Paying Living Expenses: Not on file  Food Insecurity:   . Worried About Charity fundraiser in the Last Year: Not on file  . Ran Out of Food in the Last Year: Not on file  Transportation Needs:   . Lack of Transportation (Medical): Not on file  . Lack of Transportation (Non-Medical): Not on file  Physical Activity:   . Days of Exercise per Week: Not on file  . Minutes of Exercise per Session: Not on file  Stress:   . Feeling of Stress : Not on file  Social Connections:   . Frequency of Communication with Friends and Family: Not on file  . Frequency of Social Gatherings with Friends and Family: Not on file  . Attends Religious Services: Not on file  . Active Member of Clubs or Organizations: Not on file  . Attends Archivist Meetings: Not on file  . Marital Status: Not on file  Intimate Partner Violence:   . Fear of Current or Ex-Partner: Not on file  . Emotionally Abused: Not on file  . Physically Abused: Not on file  . Sexually Abused: Not on file    Family History:    Family History  Problem Relation Age of Onset  . Lung cancer Brother   . Melanoma Brother   . Hypertension Mother   . Stroke Mother   . Other Father        poor circulation  . Atopy Neg Hx   . Asthma Neg Hx   . Breast cancer Neg Hx       ROS:  Please see the history of present illness.  All other ROS reviewed and negative.     Physical Exam/Data:   Vitals:   07/29/20 1119 07/29/20 1121 07/29/20 1345 07/29/20 1400  BP:  (!) 186/70 (!) 186/72 (!) 196/59  Pulse: (!) 42  (!) 40 (!) 40  Resp: 18  (!) 22 20  Temp: 98.4 F (36.9 C)     TempSrc: Oral     SpO2: 98%  99% 99%  Weight:      Height:  No intake or output data in the 24 hours ending 07/29/20 1659 Last 3 Weights 07/29/2020 07/29/2020 05/04/2020  Weight (lbs) 188 lb 0.8 oz 188 lb 185 lb  Weight (kg) 85.3 kg 85.276 kg 83.915 kg     Body mass index is 29.45 kg/m.  General:  Well nourished, well developed, in no acute distress HEENT: normal Lymph: no adenopathy Neck: Possible JVD Endocrine:  No thryomegaly Vascular: No carotid bruits; FA pulses 2+ bilaterally without bruits  Cardiac:  normal S1, S2; irregular slow rate; no murmur  Lungs:  clear to auscultation bilaterally, no wheezing, rhonchi or rales  Abd: soft, nontender, no hepatomegaly  Ext: Trace edema  Musculoskeletal:  No deformities, BUE and BLE strength normal and equal Skin: warm and dry  Neuro:  CNs 2-12 intact, no focal abnormalities noted Psych:  Normal affect   EKG:  The EKG was personally reviewed and demonstrates:  Atrial fibrillation at rate of 43 bpm, RBBB Telemetry:  Telemetry was personally reviewed and demonstrates: Atrial fibrillation at rate of 40s  Relevant CV Studies:  Echo 01/2020 1. Left ventricular ejection fraction, by estimation, is 60 to 65%. The  left ventricle has normal function. The left ventricle has no regional  wall motion abnormalities. There is mild concentric left ventricular  hypertrophy. Left ventricular diastolic  function could not be evaluated. Elevated left ventricular end-diastolic  pressure.  2. Right ventricular systolic function is normal. The right ventricular  size is normal. There is moderately elevated pulmonary artery systolic   pressure.  3. Left atrial size was severely dilated.  4. Right atrial size was mild to moderately dilated.  5. The mitral valve is abnormal. Trivial mitral valve regurgitation. Mild  mitral stenosis.  6. The aortic valve is tricuspid. Aortic valve regurgitation is not  visualized. Mild aortic valve sclerosis is present, with no evidence of  aortic valve stenosis.  7. The inferior vena cava is normal in size with greater than 50%  respiratory variability, suggesting right atrial pressure of 3 mmHg.   Laboratory Data:  High Sensitivity Troponin:   Recent Labs  Lab 07/29/20 1121  TROPONINIHS 8     Chemistry Recent Labs  Lab 07/29/20 1121  NA 142  K 4.0  CL 114*  CO2 17*  GLUCOSE 163*  BUN 27*  CREATININE 2.01*  CALCIUM 9.1  GFRNONAA 24*  ANIONGAP 11    Hematology Recent Labs  Lab 07/29/20 1121  WBC 8.4  RBC 3.84*  HGB 10.6*  HCT 31.5*  MCV 82.0  MCH 27.6  MCHC 33.7  RDW 15.9*  PLT 162   BNP Recent Labs  Lab 07/29/20 1121  BNP 371.9*    Radiology/Studies:  DG Chest 2 View  Result Date: 07/29/2020 CLINICAL DATA:  Shortness of breath. EXAM: CHEST - 2 VIEW COMPARISON:  None. FINDINGS: The cardio pericardial silhouette is enlarged. There is pulmonary vascular congestion without overt pulmonary edema. No focal airspace consolidation. The visualized bony structures of the thorax show no acute abnormality. IMPRESSION: Enlarged cardiopericardial silhouette with pulmonary vascular congestion. Possible interstitial edema. Electronically Signed   By: Misty Stanley M.D.   On: 07/29/2020 12:11     Assessment and Plan:   1. Permanent atrial fibrillation with slow ventricular rate -Not on any rate control agent.  Anticoagulated with Eliquis 2.5 mg twice daily (age and renal function appropriate).  No dizziness or syncope.  No indication for pacemaker currently. Ambulated with PT and see HR response and oxygen saturation.   2.  Dyspnea  on exertion -This is  chronic with questionable recent exacerbation -No exertional chest tightness or pressure -Does have underlying history of CAD but not a candidate for invasive evaluation given kidney disease -Could be deconditioning - Seems multifactorial from deconditioning, COPD, CAD and CHF - Echo done, pending reading   3.  CAD -Moderate disease by coronary CT in 2017 with negative FFR -As above -Not on aspirin as she is on anticoagulation -Continue statin -No beta-blocker given bradycardia  4.  Chronic diastolic heart failure -BNP minimally elevated at 370.  Chest x-ray with vascular congestion and mild interstitial edema.  Mild lower extremity edema on exam. -Likely due to hypertensive urgency and high salt diet -start IV lasix 40mg  BID (got dose 1 AM) will given another dose tonight and watch renal function.   5.  Hypertensive urgency -Reports compliance with medication -Most recent blood pressure 196/59 - Losartan held given ago tomorrow - Increase hydralazine to 50mg  TID and up titrate further  - Continue Amlodipine 5 mg qd   6. 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 COPD - No active wheezing.    New York Heart Association (NYHA) Functional Class NYHA Class I [id number]}  CHA2DS2-VASc Score = 7  This indicates a 11.2% annual risk of stroke. The patient's score is based upon: CHF History: 1 HTN History: 1 Diabetes History: 1 Stroke History: 0 Vascular Disease History: 1 Age Score: 2 Gender Score: 1   For questions or updates, please contact [place] Please consult [email or web address] for contact info under    Jarrett Soho, PA  07/29/2020 4:59 PM   Personally seen and examined. Agree with above.   84 year old female with multiple medical issues here for shortness of breath.  She does have permanent atrial fibrillation and noted bradycardia associated with this.  Heart rate on telemetry monitor while talking with her is  between 39 and 43 bpm.  She is alert during these conversations.  Hypertensive.  Denies any syncope at home.  She has been having some shortness of breath with activity.  Does have underlying asthma as well.  GEN: Well nourished, well developed, in no acute distress  HEENT: normal, currently getting breathing treatment Neck: no JVD, carotid bruits, or masses Cardiac: Bradycardic fairly regular; no murmurs, rubs, or gallops, trace edema  Respiratory:  clear to auscultation bilaterally, normal work of breathing GI: soft, nontender, nondistended, + BS MS: no deformity or atrophy  Skin: warm and dry, no rash Neuro:  Alert and Oriented x 3, Strength and sensation are intact Psych: euthymic mood, full affect  Echo 4/21-EF 65% with mild LVH.  Creatinine currently 2.01-baseline 1.8-2.1  Assessment and plan:  Bradycardia associated with atrial fibrillation -She is not on any rate controlling agents.  She is not having syncope at home.  I am curious to see how her heart rate would respond to walking/exercise.  Tomorrow when able, I would like physical therapy to walker in the hallway and to record heart rates.  I want to make sure she has chronotropic competence.  Obviously if her heart rate remains significantly slow, she may benefit from pacemaker therapy.  For now, would hold off on pacemaker even with her resting bradycardia in the 40s given her normal appearing perfusion.  -Losartan currently on hold -Increasing hydralazine -Giving Lasix 40 mg IV twice daily.  Candee Furbish, MD

## 2020-07-29 NOTE — Progress Notes (Signed)
Subjective:    Patient ID: [name], female    DOB: 1936-08-27, 84 y.o.   MRN: [id number]  HPI Pt returns for f/u of diabetes mellitus: DM type: Insulin-requiring type 2 Dx'ed: 8381 Complications: renal insuff Therapy: insulin since 2008 GDM: never DKA: never Severe hypoglycemia: never.   Pancreatitis: never.  Other: she takes multiple daily injections.   Interval history: no cbg record, but states cbg varies from 150-200.  pt states she feels well in general.  She takes insulin as rx'ed.  She has nausea and doe x a few days.  She denies dizziness.   Past Medical History:  Diagnosis Date  . Allergic rhinitis   . Anterior chest wall pain   . Anxiety   . Asthma   . Chronic diastolic CHF (congestive heart failure) (HCC)    Echo 01/2020: EF 60-65, normal wall motion, mild LVH, normal RV SF, RVSP 52.6 (moderate elevation), severe LAE, moderate RAE, trivial MR, mild MS (mean gradient 5.5 mmHg), mild aortic valve sclerosis (no AS); elevated E/e' c/w elevated LVEDP  . Cough   . DM type 2 (diabetes mellitus, type 2) ([place])   . GERD (gastroesophageal reflux disease)   . History of diverticulitis of colon   . HTN (hypertension)   . Hyperlipidemia   . IBS (irritable bowel syndrome)   . Iron deficiency anemia   . Osteoporosis, unspecified   . Renal insufficiency 07/31/2017  . UTI (urinary tract infection)     Past Surgical History:  Procedure Laterality Date  . CARDIOVASCULAR STRESS TEST  02/25/04  . ESOPHAGOGASTRODUODENOSCOPY  12/26/01  . RIGHT OOPHORECTOMY  jan 2010    Social History   Socioeconomic History  . Marital status: Widowed    Spouse name: Not on file  . Number of children: Not on file  . Years of education: Not on file  . Highest education level: Not on file  Occupational History  . Occupation: retired  Tobacco Use  . Smoking status: Former Smoker    Packs/day: 0.30    Years: 5.00    Pack years: 1.50    Types: Cigarettes    Quit date: 10/02/1986    Years  since quitting: 33.8  . Smokeless tobacco: Never Used  Vaping Use  . Vaping Use: Never used  Substance and Sexual Activity  . Alcohol use: No  . Drug use: No  . Sexual activity: Not on file  Other Topics Concern  . Not on file  Social History Narrative  . Not on file   Social Determinants of Health   Financial Resource Strain:   . Difficulty of Paying Living Expenses: Not on file  Food Insecurity:   . Worried About Charity fundraiser in the Last Year: Not on file  . Ran Out of Food in the Last Year: Not on file  Transportation Needs:   . Lack of Transportation (Medical): Not on file  . Lack of Transportation (Non-Medical): Not on file  Physical Activity:   . Days of Exercise per Week: Not on file  . Minutes of Exercise per Session: Not on file  Stress:   . Feeling of Stress : Not on file  Social Connections:   . Frequency of Communication with Friends and Family: Not on file  . Frequency of Social Gatherings with Friends and Family: Not on file  . Attends Religious Services: Not on file  . Active Member of Clubs or Organizations: Not on file  . Attends Archivist Meetings:  Not on file  . Marital Status: Not on file  Intimate Partner Violence:   . Fear of Current or Ex-Partner: Not on file  . Emotionally Abused: Not on file  . Physically Abused: Not on file  . Sexually Abused: Not on file    No current facility-administered medications on file prior to visit.   Current Outpatient Medications on File Prior to Visit  Medication Sig Dispense Refill  . acetaminophen (TYLENOL) 325 MG tablet Take 650 mg by mouth every 6 (six) hours as needed for pain.    Marland Kitchen amLODipine (NORVASC) 5 MG tablet TAKE 1 TABLET BY MOUTH TWICE A DAY (Patient taking differently: Take 5 mg by mouth in the morning and at bedtime. ) 180 tablet 2  . Blood Glucose Monitoring Suppl (ONE TOUCH ULTRA 2) W/DEVICE KIT Check blood sugar 3 times per day. 1 each 0  . budesonide-formoterol (SYMBICORT)  160-4.5 MCG/ACT inhaler TAKE 2 PUFFS FIRST THING IN AM AND THEN ANOTHER 2 PUFFS ABOUT 12 HOURS LATER. (Patient taking differently: Inhale 2 puffs into the lungs every 12 (twelve) hours. ) 30.6 Inhaler 5  . cetirizine (ZYRTEC ALLERGY) 10 MG tablet Take 1 tablet (10 mg total) by mouth daily as needed for allergies.    . hydrALAZINE (APRESOLINE) 25 MG tablet Take 1 tablet (25 mg total) by mouth 3 (three) times daily. 270 tablet 3  . insulin lispro (HUMALOG) 100 UNIT/ML injection 5 UNITS WITH BREAKFAST, AND 25 UNITS WITH SUPPER (Patient taking differently: Inject 5-25 Units into the skin See admin instructions. 5 UNITS WITH BREAKFAST, AND 25 UNITS WITH SUPPER) 20 mL 0  . montelukast (SINGULAIR) 10 MG tablet TAKE 1 TABLET BY MOUTH AT BEDTIME (Patient taking differently: Take 10 mg by mouth at bedtime. ) 90 tablet 5  . nystatin (MYCOSTATIN) 100000 UNIT/ML suspension Take 5 mLs (500,000 Units total) by mouth 4 (four) times daily. (Patient not taking: Reported on 07/29/2020) 150 mL 0  . omeprazole (PRILOSEC) 40 MG capsule Take 1 capsule (40 mg total) by mouth daily. 30 capsule 11  . potassium chloride (KLOR-CON) 10 MEQ tablet TAKE 1 TABLET BY MOUTH EVERY DAY (Patient taking differently: Take 10 mEq by mouth daily. ) 90 tablet 2  . pravastatin (PRAVACHOL) 40 MG tablet Take 1 tablet (40 mg total) by mouth daily. 90 tablet 3  . predniSONE (DELTASONE) 10 MG tablet Take  4 each am x 2 days,   2 each am x 2 days,  1 each am x 2 days and stop (Patient not taking: Reported on 07/29/2020) 14 tablet 0  . albuterol (PROAIR HFA) 108 (90 Base) MCG/ACT inhaler Inhale 2 puffs into the lungs every 4 (four) hours as needed for wheezing. 18 g 11  . furosemide (LASIX) 40 MG tablet Take 1 tablet (40 mg total) by mouth daily. 90 tablet 3  . losartan (COZAAR) 100 MG tablet Take 1 tablet (100 mg total) by mouth daily. 90 tablet 3    Allergies  Allergen Reactions  . Aspirin Shortness Of Breath and Other (See Comments)    Caused  asthma symptoms  . Ramipril Cough    Family History  Problem Relation Age of Onset  . Lung cancer Brother   . Melanoma Brother   . Hypertension Mother   . Stroke Mother   . Other Father        poor circulation  . Atopy Neg Hx   . Asthma Neg Hx   . Breast cancer Neg Hx  BP (!) 144/60   Pulse (!) 42   Temp 98.2 F (36.8 C)   Ht '5\' 7"'  (1.702 m)   Wt 188 lb (85.3 kg)   SpO2 97%   BMI 29.44 kg/m    Review of Systems Denies LOC    Objective:   Physical Exam VITAL SIGNS:  See vs page GENERAL: no distress Pulses: dorsalis pedis intact bilat.   MSK: no deformity of the feet CV: no leg edema Skin:  no ulcer on the feet.  normal color and temp on the feet. Neuro: sensation is intact to touch on the feet  Lab Results  Component Value Date   HGBA1C 5.2 07/29/2020   Lab Results  Component Value Date   CREATININE 2.51 (H) 07/31/2020   BUN 46 (H) 07/31/2020   NA 138 07/31/2020   K 4.4 07/31/2020   CL 106 07/31/2020   CO2 21 (L) 07/31/2020       Assessment & Plan:  Insulin-requiring type 2 DM, with CRI: A1c is much lower than cbg's would suggest.  Check fructosamine.  Bradycardia: uncertain etiology and prognosis.   Patient Instructions  Please go to the emergency room now, because of your symptoms and slow heart rate.   Blood tests are requested for you today.  We'll let you know about the results.  check your blood sugar twice a day.  vary the time of day when you check, between before the 3 meals, and at bedtime.  also check if you have symptoms of your blood sugar being too high or too low.  please keep a record of the readings and bring it to your next appointment here (or you can bring the meter itself).  You can write it on any piece of paper.  please call us sooner if your blood sugar goes below 70, or if you have a lot of readings over 200. Please come back for a follow-up appointment in 3 months.

## 2020-07-29 NOTE — ED Provider Notes (Signed)
[place] EMERGENCY DEPARTMENT Provider Note   CSN: [id number] Arrival date & time: 07/29/20  1110     History Chief Complaint  Patient presents with  . Shortness of Breath    [name] is a 84 y.o. female.  The history is provided by the patient.  Shortness of Breath Severity:  Moderate Onset quality:  Gradual Timing:  Constant Progression:  Unchanged Chronicity:  Recurrent Context: not URI   Context comment:  Dry cough with SOB and leg swelling, using inhaler with not much relief. Went to doctor today who sent for eval. Relieved by:  Rest and inhaler Worsened by:  Exertion Associated symptoms: cough   Associated symptoms: no abdominal pain, no chest pain, no ear pain, no fever, no rash, no sore throat and no vomiting   Risk factors: no hx of PE/DVT   Risk factors comment:  Heart failure, Asthma      Past Medical History:  Diagnosis Date  . Allergic rhinitis   . Anterior chest wall pain   . Anxiety   . Asthma   . Chronic diastolic CHF (congestive heart failure) (HCC)    Echo 01/2020: EF 60-65, normal wall motion, mild LVH, normal RV SF, RVSP 52.6 (moderate elevation), severe LAE, moderate RAE, trivial MR, mild MS (mean gradient 5.5 mmHg), mild aortic valve sclerosis (no AS); elevated E/e' c/w elevated LVEDP  . Cough   . DM type 2 (diabetes mellitus, type 2) ([place])   . GERD (gastroesophageal reflux disease)   . History of diverticulitis of colon   . HTN (hypertension)   . Hyperlipidemia   . IBS (irritable bowel syndrome)   . Iron deficiency anemia   . Osteoporosis, unspecified   . Renal insufficiency 07/31/2017  . UTI (urinary tract infection)     Patient Active Problem List   Diagnosis Date Noted  . Pain in right knee 03/17/2020  . Chronic diastolic CHF (congestive heart failure) ([place])   . DKA (diabetic ketoacidoses) 12/20/2019  . Diverticulosis 12/10/2019  . DOE (dyspnea on exertion) 01/30/2018  . Acute bronchiolitis 12/17/2017  .  Wheezing 10/29/2017  . Vitamin D deficiency 07/31/2017  . Renal insufficiency 07/31/2017  . Knee pain 06/01/2017  . Morbid obesity due to excess calories ([place]) 01/25/2017  . Diabetes ([place]) 04/12/2016  . Cough 01/20/2015  . Atrial fibrillation ([place]) 05/28/2014  . Personal history of colonic polyps 05/28/2014  . Vomiting 01/29/2014  . CAD (coronary artery disease) 09/01/2013  . Routine general medical examination at a health care facility 07/17/2011  . Encounter for long-term (current) use of other medications 07/06/2011  . Nonspecific (abnormal) findings on radiological and other examination of body structure 11/09/2009  . IRRITABLE BOWEL SYNDROME 05/07/2009  . CHEST PAIN 05/07/2009  . ADNEXAL MASS, RIGHT 07/22/2008  . HEMORRHOIDS, RECURRENT 01/27/2008  . Diverticulitis 01/27/2008  . OTH ABNORMAL FIND RAD EXAMINATION BREAST 01/27/2008  . GERD 01/13/2008  . UTI 09/28/2007  . Dyslipidemia 05/27/2007  . ANEMIA-IRON DEFICIENCY 05/27/2007  . ANXIETY 05/27/2007  . Essential hypertension 05/27/2007  . Seasonal allergic rhinitis 05/27/2007  . Cough variant asthma 05/27/2007  . Osteoporosis 05/27/2007  . DIVERTICULITIS, HX OF 05/27/2007    Past Surgical History:  Procedure Laterality Date  . CARDIOVASCULAR STRESS TEST  02/25/04  . ESOPHAGOGASTRODUODENOSCOPY  12/26/01  . RIGHT OOPHORECTOMY  jan 2010     OB History   No obstetric history on file.     Family History  Problem Relation Age of Onset  . Lung  cancer Brother   . Melanoma Brother   . Hypertension Mother   . Stroke Mother   . Other Father        poor circulation  . Atopy Neg Hx   . Asthma Neg Hx   . Breast cancer Neg Hx     Social History   Tobacco Use  . Smoking status: Former Smoker    Packs/day: 0.30    Years: 5.00    Pack years: 1.50    Types: Cigarettes    Quit date: 10/02/1986    Years since quitting: 33.8  . Smokeless tobacco: Never Used  Vaping Use  . Vaping Use: Never used  Substance Use Topics   . Alcohol use: No  . Drug use: No    Home Medications Prior to Admission medications   Medication Sig Start Date End Date Taking? Authorizing Provider  acetaminophen (TYLENOL) 325 MG tablet Take 650 mg by mouth every 6 (six) hours as needed for pain.    [provider]  albuterol (PROAIR HFA) 108 (90 Base) MCG/ACT inhaler Inhale 2 puffs into the lungs every 4 (four) hours as needed for wheezing. 05/05/19 05/04/20  Tanda Rockers, MD  amLODipine (NORVASC) 5 MG tablet TAKE 1 TABLET BY MOUTH TWICE A DAY 12/29/19   Fay Records, MD  Blood Glucose Monitoring Suppl (ONE TOUCH ULTRA 2) W/DEVICE KIT Check blood sugar 3 times per day. 07/24/14   Renato Shin, MD  budesonide-formoterol (SYMBICORT) 160-4.5 MCG/ACT inhaler TAKE 2 PUFFS FIRST THING IN AM AND THEN ANOTHER 2 PUFFS ABOUT 12 HOURS LATER. 04/15/20   Tanda Rockers, MD  cetirizine (ZYRTEC ALLERGY) 10 MG tablet Take 1 tablet (10 mg total) by mouth daily as needed for allergies. 05/04/20   Tanda Rockers, MD  ELIQUIS 2.5 MG TABS tablet TAKE 1 TABLET BY MOUTH TWICE A DAY 07/29/20   Fay Records, MD  furosemide (LASIX) 40 MG tablet Take 1 tablet (40 mg total) by mouth daily. 01/23/20 04/22/20  Richardson Dopp T, PA-C  hydrALAZINE (APRESOLINE) 25 MG tablet Take 1 tablet (25 mg total) by mouth 3 (three) times daily. 03/10/20   Kathlen Mody, Scott T, PA-C  insulin lispro (HUMALOG) 100 UNIT/ML injection 5 UNITS WITH BREAKFAST, AND 25 UNITS WITH SUPPER 07/06/20   Renato Shin, MD  Insulin Syringe-Needle U-100 (BD INSULIN SYRINGE ULTRAFINE) 31G X 5/16" 0.3 ML MISC Use as directed     [provider]  losartan (COZAAR) 100 MG tablet Take 1 tablet (100 mg total) by mouth daily. 01/23/20 04/22/20  Richardson Dopp T, PA-C  montelukast (SINGULAIR) 10 MG tablet TAKE 1 TABLET BY MOUTH AT BEDTIME 12/15/19   Tanda Rockers, MD  nystatin (MYCOSTATIN) 100000 UNIT/ML suspension Take 5 mLs (500,000 Units total) by mouth 4 (four) times daily. 04/30/20   [name], FNP  omeprazole (PRILOSEC) 40 MG capsule Take 1 capsule (40 mg total) by mouth daily. 05/04/20   Tanda Rockers, MD  ONE TOUCH LANCETS MISC Use as directed three times daily     [provider]  potassium chloride (KLOR-CON) 10 MEQ tablet TAKE 1 TABLET BY MOUTH EVERY DAY 12/16/19   Fay Records, MD  pravastatin (PRAVACHOL) 40 MG tablet Take 1 tablet (40 mg total) by mouth daily. 08/27/19   [name], FNP  predniSONE (DELTASONE) 10 MG tablet Take  4 each am x 2 days,   2 each am x 2 days,  1 each am x 2 days and  stop 05/04/20   Tanda Rockers, MD    Allergies    Aspirin and Ramipril  Review of Systems   Review of Systems  Constitutional: Negative for chills and fever.  HENT: Negative for ear pain and sore throat.   Eyes: Negative for pain and visual disturbance.  Respiratory: Positive for cough and shortness of breath.   Cardiovascular: Positive for leg swelling. Negative for chest pain and palpitations.  Gastrointestinal: Negative for abdominal pain and vomiting.  Genitourinary: Negative for dysuria and hematuria.  Musculoskeletal: Negative for arthralgias and back pain.  Skin: Negative for color change and rash.  Neurological: Negative for seizures and syncope.  All other systems reviewed and are negative.   Physical Exam Updated Vital Signs  ED Triage Vitals  Enc Vitals Group     BP 07/29/20 1121 (!) 186/70     Pulse Rate 07/29/20 1119 (!) 42     Resp 07/29/20 1119 18     Temp 07/29/20 1119 98.4 F (36.9 C)     Temp Source 07/29/20 1119 Oral     SpO2 07/29/20 1119 98 %     Weight 07/29/20 1117 188 lb 0.8 oz (85.3 kg)     Height 07/29/20 1117 '5\' 7"'  (1.702 m)     Head Circumference --      Peak Flow --      Pain Score 07/29/20 1117 0     Pain Loc --      Pain Edu? --      Excl. in [place]? --     Physical Exam Vitals and nursing note reviewed.  Constitutional:      General: She is not in acute distress.    Appearance: She is well-developed.  She is not ill-appearing.  HENT:     Head: Normocephalic and atraumatic.  Eyes:     Conjunctiva/sclera: Conjunctivae normal.     Pupils: Pupils are equal, round, and reactive to light.  Cardiovascular:     Rate and Rhythm: Normal rate and regular rhythm.     Pulses: Normal pulses.     Heart sounds: Normal heart sounds. No murmur heard.   Pulmonary:     Effort: Pulmonary effort is normal. Tachypnea present. No respiratory distress.     Breath sounds: Decreased breath sounds present.  Abdominal:     Palpations: Abdomen is soft.     Tenderness: There is no abdominal tenderness.  Musculoskeletal:     Cervical back: Normal range of motion and neck supple.     Right lower leg: Edema (2+) present.     Left lower leg: Edema (2+) present.  Skin:    General: Skin is warm and dry.     Capillary Refill: Capillary refill takes less than 2 seconds.  Neurological:     General: No focal deficit present.     Mental Status: She is alert.  Psychiatric:        Mood and Affect: Mood normal.     ED Results / Procedures / Treatments   Labs (all labs ordered are listed, but only abnormal results are displayed) Labs Reviewed  BASIC METABOLIC PANEL - Abnormal; Notable for the following components:      Result Value   Chloride 114 (*)    CO2 17 (*)    Glucose, Bld 163 (*)    BUN 27 (*)    Creatinine, Ser 2.01 (*)    GFR, Estimated 24 (*)    All other components within normal limits  CBC -  Abnormal; Notable for the following components:   RBC 3.84 (*)    Hemoglobin 10.6 (*)    HCT 31.5 (*)    RDW 15.9 (*)    All other components within normal limits  BRAIN NATRIURETIC PEPTIDE - Abnormal; Notable for the following components:   B Natriuretic Peptide 371.9 (*)    All other components within normal limits  RESPIRATORY PANEL BY RT PCR (FLU A&B, COVID)  TROPONIN I (HIGH SENSITIVITY)  TROPONIN I (HIGH SENSITIVITY)    EKG EKG Interpretation  Date/Time:  Thursday July 29 2020 11:13:33  EDT Ventricular Rate:  43 PR Interval:    QRS Duration: 124 QT Interval:  506 QTC Calculation: 427 R Axis:   115 Text Interpretation: Atrial fibrillation Right bundle branch block Abnormal ECG Confirmed by Lennice Sites [id number]) on 07/29/2020 11:45:29 AM   Radiology DG Chest 2 View  Result Date: 07/29/2020 CLINICAL DATA:  Shortness of breath. EXAM: CHEST - 2 VIEW COMPARISON:  None. FINDINGS: The cardio pericardial silhouette is enlarged. There is pulmonary vascular congestion without overt pulmonary edema. No focal airspace consolidation. The visualized bony structures of the thorax show no acute abnormality. IMPRESSION: Enlarged cardiopericardial silhouette with pulmonary vascular congestion. Possible interstitial edema. Electronically Signed   By: Misty Stanley M.D.   On: 07/29/2020 12:11    Procedures Procedures (including critical care time)  Medications Ordered in ED Medications  albuterol (VENTOLIN HFA) 108 (90 Base) MCG/ACT inhaler 4 puff (has no administration in time range)  ipratropium (ATROVENT HFA) inhaler 2 puff (has no administration in time range)  furosemide (LASIX) injection 40 mg (has no administration in time range)  methylPREDNISolone sodium succinate (SOLU-MEDROL) 125 mg/2 mL injection 125 mg (125 mg Intravenous Given 07/29/20 1315)    ED Course  I have reviewed the triage vital signs and the nursing notes.  Pertinent labs & imaging results that were available during my care of the patient were reviewed by me and considered in my medical decision making (see chart for details).    MDM Rules/Calculators/A&P                          [name] is an 84 year old female history of asthma, heart failure who presents to the ED with shortness of breath.  Patient hypertensive but otherwise vitals unremarkable.  She appears to have atrial fibrillation at a rate around mid 40s to 50s.  History of the same.  EKG unchanged from prior EKG.  No obvious heart block.   Patient has noticed ongoing shortness of breath with cough but no sputum production.  Weight gain in her legs.  She has pitting edema in her legs bilaterally.  Has been using her inhaler with some relief.  Overall she has diminished breath sounds throughout.  Not great air movement and hard to hear wheezing or not.  Suspect multifactorial shortness of breath likely combination of asthma and heart failure.  Will give breathing treatments and steroids and initiate work-up including troponin, BNP, chest x-ray.  Will get Covid test.  She is on room air but has slightly increased work of breathing and uncomfortable.  Chest x-ray more consistent with volume overload.  BNP is elevated however troponin normal.  She remains mildly hypertensive.  Heart rate in the 30s to 40s with slow A. fib.  Creatinine mildly above baseline at 2.01.  Bicarb 17.  No significant leukocytosis or anemia.  Overall I suspect that she has symptomatic heart failure exacerbation.  She is tachypneic.  Heart rate also low may be causing some irritability from volume overload.  Does have poor kidney function at baseline and believe she would benefit from closer monitoring and diuresis inpatient given that she is symptomatic.  Will admit to medicine.  A dose of IV Lasix has been given.   This chart was dictated using voice recognition software.  Despite best efforts to proofread,  errors can occur which can change the documentation meaning.  [name] was evaluated in Emergency Department on 07/29/2020 for the symptoms described in the history of present illness. She was evaluated in the context of the global COVID-19 pandemic, which necessitated consideration that the patient might be at risk for infection with the SARS-CoV-2 virus that causes COVID-19. Institutional protocols and algorithms that pertain to the evaluation of patients at risk for COVID-19 are in a state of rapid change based on information released by regulatory bodies  including the CDC and federal and state organizations. These policies and algorithms were followed during the patient's care in the ED.   Final Clinical Impression(s) / ED Diagnoses Final diagnoses:  Shortness of breath  Acute on chronic heart failure, unspecified heart failure type [facility])    Rx / DC Orders ED Discharge Orders    None       Lennice Sites, DO 07/29/20 1317

## 2020-07-29 NOTE — Progress Notes (Signed)
  Echocardiogram 2D Echocardiogram has been performed.  Darlina Sicilian M 07/29/2020, 3:29 PM

## 2020-07-29 NOTE — ED Notes (Signed)
Dinner Tray Ordered @ 1657. 

## 2020-07-29 NOTE — Patient Instructions (Addendum)
Please go to the emergency room now, because of your symptoms and slow heart rate.   Blood tests are requested for you today.  We'll let you know about the results.  check your blood sugar twice a day.  vary the time of day when you check, between before the 3 meals, and at bedtime.  also check if you have symptoms of your blood sugar being too high or too low.  please keep a record of the readings and bring it to your next appointment here (or you can bring the meter itself).  You can write it on any piece of paper.  please call us sooner if your blood sugar goes below 70, or if you have a lot of readings over 200. Please come back for a follow-up appointment in 3 months.

## 2020-07-30 DIAGNOSIS — I2510 Atherosclerotic heart disease of native coronary artery without angina pectoris: Secondary | ICD-10-CM

## 2020-07-30 DIAGNOSIS — J45991 Cough variant asthma: Secondary | ICD-10-CM

## 2020-07-30 DIAGNOSIS — R0602 Shortness of breath: Secondary | ICD-10-CM | POA: Diagnosis not present

## 2020-07-30 DIAGNOSIS — R001 Bradycardia, unspecified: Secondary | ICD-10-CM | POA: Diagnosis not present

## 2020-07-30 DIAGNOSIS — I5032 Chronic diastolic (congestive) heart failure: Secondary | ICD-10-CM

## 2020-07-30 DIAGNOSIS — I4821 Permanent atrial fibrillation: Secondary | ICD-10-CM | POA: Diagnosis not present

## 2020-07-30 LAB — COMPREHENSIVE METABOLIC PANEL
ALT: 44 U/L (ref 0–44)
AST: 25 U/L (ref 15–41)
Albumin: 3.5 g/dL (ref 3.5–5.0)
Alkaline Phosphatase: 74 U/L (ref 38–126)
Anion gap: 12 (ref 5–15)
BUN: 30 mg/dL — ABNORMAL HIGH (ref 8–23)
CO2: 17 mmol/L — ABNORMAL LOW (ref 22–32)
Calcium: 9.5 mg/dL (ref 8.9–10.3)
Chloride: 107 mmol/L (ref 98–111)
Creatinine, Ser: 2.06 mg/dL — ABNORMAL HIGH (ref 0.44–1.00)
GFR, Estimated: 24 mL/min — ABNORMAL LOW (ref >60)
Glucose, Bld: 215 mg/dL — ABNORMAL HIGH (ref 70–99)
Potassium: 4.3 mmol/L (ref 3.5–5.1)
Sodium: 136 mmol/L (ref 135–145)
Total Bilirubin: 1.1 mg/dL (ref 0.3–1.2)
Total Protein: 6.5 g/dL (ref 6.5–8.1)

## 2020-07-30 LAB — GLUCOSE, CAPILLARY
Glucose-Capillary: 201 mg/dL — ABNORMAL HIGH (ref 70–99)
Glucose-Capillary: 174 mg/dL — ABNORMAL HIGH (ref 70–99)
Glucose-Capillary: 259 mg/dL — ABNORMAL HIGH (ref 70–99)
Glucose-Capillary: 204 mg/dL — ABNORMAL HIGH (ref 70–99)

## 2020-07-30 LAB — CBC
HCT: 30.7 % — ABNORMAL LOW (ref 36.0–46.0)
Hemoglobin: 10.6 g/dL — ABNORMAL LOW (ref 12.0–15.0)
MCH: 27.6 pg (ref 26.0–34.0)
MCHC: 34.5 g/dL (ref 30.0–36.0)
MCV: 79.9 fL — ABNORMAL LOW (ref 80.0–100.0)
Platelets: 168 10*3/uL (ref 150–400)
RBC: 3.84 MIL/uL — ABNORMAL LOW (ref 3.87–5.11)
RDW: 15.4 % (ref 11.5–15.5)
WBC: 6.6 10*3/uL (ref 4.0–10.5)
nRBC: 0.3 % — ABNORMAL HIGH (ref 0.0–0.2)

## 2020-07-30 LAB — SURGICAL PCR SCREEN
MRSA, PCR: NEGATIVE
Staphylococcus aureus: NEGATIVE

## 2020-07-30 LAB — HEMOGLOBIN A1C
Hgb A1c MFr Bld: 5.2 % (ref 4.8–5.6)
Mean Plasma Glucose: 103 mg/dL

## 2020-07-30 MED ORDER — SODIUM CHLORIDE 0.9 % IV SOLN: INTRAVENOUS | Status: DC

## 2020-07-30 MED ORDER — HYDRALAZINE HCL 50 MG PO TABS
75.0000 mg | ORAL_TABLET | Freq: Three times a day (TID) | ORAL | Status: DC
  Administered 2020-07-30 – 2020-08-03 (×12): 75 mg via ORAL
  Filled 2020-07-30 (×14): qty 1

## 2020-07-30 MED ORDER — SODIUM CHLORIDE 0.9 % IV SOLN
80.0000 mg | INTRAVENOUS | Status: AC
  Filled 2020-07-30: qty 2

## 2020-07-30 MED ORDER — CEFAZOLIN SODIUM-DEXTROSE 2-4 GM/100ML-% IV SOLN: 2.0000 g | INTRAVENOUS | Status: AC

## 2020-07-30 MED ORDER — IPRATROPIUM-ALBUTEROL 0.5-2.5 (3) MG/3ML IN SOLN
3.0000 mL | Freq: Two times a day (BID) | RESPIRATORY_TRACT | Status: DC
  Administered 2020-07-30 (×2): 3 mL via RESPIRATORY_TRACT
  Filled 2020-07-30: qty 3

## 2020-07-30 MED ORDER — FUROSEMIDE 10 MG/ML IJ SOLN
80.0000 mg | Freq: Two times a day (BID) | INTRAMUSCULAR | Status: DC
  Administered 2020-07-30 – 2020-08-01 (×4): 80 mg via INTRAVENOUS
  Filled 2020-07-30 (×4): qty 8

## 2020-07-30 NOTE — Progress Notes (Addendum)
Progress Note  Patient Name: [name] Date of Encounter: 07/30/2020  Primary Cardiologist: Dr. Dorris Carnes, MD   Subjective   Pt has no symptoms. PT to work with patient and record HR with activity   Inpatient Medications    Scheduled Meds: . amLODipine  5 mg Oral Daily  . apixaban  2.5 mg Oral BID  . furosemide  40 mg Intravenous Daily  . guaiFENesin  600 mg Oral BID  . hydrALAZINE  50 mg Oral TID  . insulin aspart  0-15 Units Subcutaneous TID WC  . ipratropium-albuterol  3 mL Nebulization BID  . loratadine  10 mg Oral Daily  . methylPREDNISolone (SOLU-MEDROL) injection  60 mg Intravenous Q12H  . mometasone-formoterol  2 puff Inhalation BID  . montelukast  10 mg Oral QHS  . pantoprazole  40 mg Oral Daily  . pravastatin  40 mg Oral Daily  . sodium chloride flush  3 mL Intravenous Q12H   Continuous Infusions: . sodium chloride     PRN Meds: sodium chloride, acetaminophen **OR** acetaminophen, albuterol, atropine, oxyCODONE, sodium chloride flush   Vital Signs    Vitals:   07/30/20 0041 07/30/20 0821 07/30/20 0823 07/30/20 0847  BP:  (!) 177/62 (!) 167/69   Pulse:  (!) 38 (!) 38 (!) 40  Resp:  18  18  Temp:  98.3 F (36.8 C)    TempSrc:  Oral    SpO2:  100%    Weight: 84.6 kg     Height:        Intake/Output Summary (Last 24 hours) at 07/30/2020 0951 Last data filed at 07/30/2020 0345 Gross per 24 hour  Intake 240 ml  Output 1400 ml  Net -1160 ml   Filed Weights   07/29/20 1117 07/30/20 0041  Weight: 85.3 kg 84.6 kg    Physical Exam   General: Well developed, well nourished, NAD Neck: Negative for carotid bruits. No JVD Lungs:Clear to ausculation bilaterally. No wheezes, rales, or rhonchi. Breathing is unlabored. Cardiovascular: Irregularly irregular. No murmurs Abdomen: Soft, non-tender, non-distended. No obvious abdominal masses. Extremities: No edema. Radial pulses 2+ bilaterally Neuro: Alert and oriented. No focal deficits. No facial  asymmetry. MAE spontaneously. Psych: Responds to questions appropriately with normal affect.    Labs    Chemistry Recent Labs  Lab 07/29/20 1121 07/30/20 0357  NA 142 136  K 4.0 4.3  CL 114* 107  CO2 17* 17*  GLUCOSE 163* 215*  BUN 27* 30*  CREATININE 2.01* 2.06*  CALCIUM 9.1 9.5  PROT  --  6.5  ALBUMIN  --  3.5  AST  --  25  ALT  --  44  ALKPHOS  --  74  BILITOT  --  1.1  GFRNONAA 24* 24*  ANIONGAP 11 12     Hematology Recent Labs  Lab 07/29/20 1121 07/30/20 0357  WBC 8.4 6.6  RBC 3.84* 3.84*  HGB 10.6* 10.6*  HCT 31.5* 30.7*  MCV 82.0 79.9*  MCH 27.6 27.6  MCHC 33.7 34.5  RDW 15.9* 15.4  PLT 162 168    Cardiac EnzymesNo results for input(s): TROPONINI in the last 168 hours. No results for input(s): TROPIPOC in the last 168 hours.   BNP Recent Labs  Lab 07/29/20 1121  BNP 371.9*     DDimer No results for input(s): DDIMER in the last 168 hours.   Radiology    DG Chest 2 View  Result Date: 07/29/2020 CLINICAL DATA:  Shortness of breath.  EXAM: CHEST - 2 VIEW COMPARISON:  None. FINDINGS: The cardio pericardial silhouette is enlarged. There is pulmonary vascular congestion without overt pulmonary edema. No focal airspace consolidation. The visualized bony structures of the thorax show no acute abnormality. IMPRESSION: Enlarged cardiopericardial silhouette with pulmonary vascular congestion. Possible interstitial edema. Electronically Signed   By: Misty Stanley M.D.   On: 07/29/2020 12:11   ECHOCARDIOGRAM COMPLETE  Result Date: 07/29/2020    ECHOCARDIOGRAM REPORT   Patient Name:   [name] Date of Exam: 07/29/2020 Medical Rec #:  [id number]   Height:       67.0 in Accession #:    [id number]  Weight:       188.1 lb Date of Birth:  [birth year]    BSA:          1.970 m Patient Age:    84 years    BP:           196/59 mmHg Patient Gender: F           HR:           38 bpm. Exam Location:  Inpatient Procedure: 2D Echo Indications:    CHF-Acute Diastolic 595.63 /  O75.64  History:        Patient has prior history of Echocardiogram examinations, most                 recent 01/21/2020. Risk Factors:Diabetes, Hypertension and                 Dyslipidemia. Anemic. Chronic kidney disease. Asthma.  Sonographer:    Darlina Sicilian RDCS Referring Phys: Hector  1. Left ventricular ejection fraction, by estimation, is 65 to 70%. The left ventricle has hyperdynamic function. The left ventricle has no regional wall motion abnormalities. There is mild left ventricular hypertrophy. Left ventricular diastolic parameters are indeterminate.  2. Right ventricular systolic function is normal. The right ventricular size is normal. There is severely elevated pulmonary artery systolic pressure. The estimated right ventricular systolic pressure is 33.2 mmHg.  3. Left atrial size was moderately dilated.  4. Right atrial size was mildly dilated.  5. The mitral valve is degenerative. No evidence of mitral valve regurgitation. Moderate mitral stenosis. The mean mitral valve gradient is 7.0 mmHg. Moderate mitral annular calcification.  6. Tricuspid valve regurgitation is moderate.  7. The aortic valve is tricuspid. Aortic valve regurgitation is not visualized. Mild aortic valve sclerosis is present, with no evidence of aortic valve stenosis.  8. The inferior vena cava is dilated in size with <50% respiratory variability, suggesting right atrial pressure of 15 mmHg.  9. Technically difficult study with poor acoustic windows. FINDINGS  Left Ventricle: Left ventricular ejection fraction, by estimation, is 65 to 70%. The left ventricle has hyperdynamic function. The left ventricle has no regional wall motion abnormalities. The left ventricular internal cavity size was normal in size. There is mild left ventricular hypertrophy. Left ventricular diastolic parameters are indeterminate. Right Ventricle: The right ventricular size is normal. No increase in right ventricular wall thickness.  Right ventricular systolic function is normal. There is severely elevated pulmonary artery systolic pressure. The tricuspid regurgitant velocity is 3.63 m/s, and with an assumed right atrial pressure of 15 mmHg, the estimated right ventricular systolic pressure is 95.1 mmHg. Left Atrium: Left atrial size was moderately dilated. Right Atrium: Right atrial size was mildly dilated. Pericardium: Trivial pericardial effusion is present. Mitral Valve: The mitral valve is degenerative in appearance. There  is moderate calcification of the mitral valve leaflet(s). Moderate mitral annular calcification. No evidence of mitral valve regurgitation. Moderate mitral valve stenosis. MV peak gradient, 23.0 mmHg. The mean mitral valve gradient is 7.0 mmHg. Tricuspid Valve: The tricuspid valve is normal in structure. Tricuspid valve regurgitation is moderate. Aortic Valve: The aortic valve is tricuspid. Aortic valve regurgitation is not visualized. Mild aortic valve sclerosis is present, with no evidence of aortic valve stenosis. Aortic valve mean gradient measures 6.0 mmHg. Aortic valve peak gradient measures 17.5 mmHg. Aortic valve area, by VTI measures 1.42 cm. Pulmonic Valve: The pulmonic valve was normal in structure. Pulmonic valve regurgitation is trivial. Aorta: The aortic root is normal in size and structure. Venous: The inferior vena cava is dilated in size with less than 50% respiratory variability, suggesting right atrial pressure of 15 mmHg. IAS/Shunts: No atrial level shunt detected by color flow Doppler.  LEFT VENTRICLE PLAX 2D LVIDd:         4.90 cm LVIDs:         2.60 cm LV PW:         1.00 cm LV IVS:        1.10 cm LVOT diam:     1.60 cm LV SV:         63 LV SV Index:   32 LVOT Area:     2.01 cm  RIGHT VENTRICLE RV S prime:     11.40 cm/s LEFT ATRIUM             Index       RIGHT ATRIUM           Index LA diam:        4.80 cm 2.44 cm/m  RA Area:     16.30 cm LA Vol (A2C):   72.0 ml 36.55 ml/m RA Volume:   35.20  ml  17.87 ml/m LA Vol (A4C):   79.3 ml 40.25 ml/m LA Biplane Vol: 80.1 ml 40.66 ml/m  AORTIC VALVE AV Area (Vmax):    1.45 cm AV Area (Vmean):   1.60 cm AV Area (VTI):     1.42 cm AV Vmax:           209.00 cm/s AV Vmean:          110.000 cm/s AV VTI:            0.440 m AV Peak Grad:      17.5 mmHg AV Mean Grad:      6.0 mmHg LVOT Vmax:         151.00 cm/s LVOT Vmean:        87.700 cm/s LVOT VTI:          0.311 m LVOT/AV VTI ratio: 0.71  AORTA Ao Root diam: 2.30 cm MITRAL VALVE                TRICUSPID VALVE MV Area (PHT): 2.68 cm     TR Peak grad:   52.7 mmHg MV Peak grad:  23.0 mmHg    TR Vmax:        363.00 cm/s MV Mean grad:  7.0 mmHg MV Vmax:       2.40 m/s     SHUNTS MV Vmean:      106.7 cm/s   Systemic VTI:  0.31 m MV Decel Time: 283 msec     Systemic Diam: 1.60 cm MV E velocity: 236.67 cm/s Loralie Champagne MD Electronically signed by Loralie Champagne MD Signature Date/Time: 07/29/2020/5:41:46 PM  Final    Telemetry    07/30/20 AF with SVR, HRs in the 30-40- Personally Reviewed  ECG    No new tracing as of 07/30/20- Personally Reviewed  Cardiac Studies   Myoview 07/2016: Medium size, moderate severity reversible anterior perfusion defect suggestive of ischemia. LVEF 73% with normal wall motion. This is an intermediate risk study.  Coronary CTA 08/2016: Calcium score of 51. Moderate CAD in the proximal and mid LAD and possible severe stenosis in the ostial D2. Negative FFR. Medical therapy recommended.   Echo 01/2020: LVEF 60-65%, elevated LVEDP  Patient Profile     84 y.o. female with a hx of permanent atrial fibrillation, CAD, chronic diastolic CHF, DM, HLD, HTN and lung nodules  who is being seen today for the evaluation of bradycardia at the request of Dr. Posey Pronto.   Assessment & Plan   1. Permanent atrial fibrillation with slow ventricular response: -Not previously on rate controlling agents -Anticoagulated with Eliquis 2.5 mg twice daily (age and renal function  appropriate) -Plan to ambulated with PT and see HR response and oxygen saturation -Remains in the 30-40'/s per telemetry review -May need EP consultation   2.  Dyspnea on exertion with severe PAH: -Reports chronic issues with recent exacerbations>>hx of asthma -Echocardiogram with stable LVEF and G1DD. -Unfortunately there is severe PAH which has changed from prior echo in 01/2020>>noted with a done, RV pressure noted to be 67.76mmHg  3.  CAD: -Moderate disease by coronary CT in 2017 with negative FFR -Not on aspirin as she is on anticoagulation -Continue statin -No beta-blocker given bradycardia  4.  Chronic diastolic heart failure: -BNP minimally elevated at 370.  Chest x-ray with vascular congestion and mild interstitial edema.  Mild lower extremity edema on exam. -Likely due to hypertensive urgency and high salt diet -Continue IV lasix 40mg  BID for now given the above echo findings  -I&O, net neg 1.1L -Weight, 186lb  -Creatinine, 2.06 today, up slightly from 2.01 07/29/20  5.  Hypertensive urgency: -Elevated, 167/69>177/62>188/60 -Losartan held given worsening renal function>>continue to hold today  -Increase hydralazine given persistently elevated BPs -May need to increase amlodipine   6. 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 COPD: -No active wheezing  Signed, Kathyrn Drown NP-C [facility] Pager: [phone] 07/30/2020, 9:51 AM     For questions or updates, please contact   Please consult [email or web address] for contact info under Cardiology/STEMI.  Personally seen and examined. Agree with above.   Walked with physical therapy.  Heart rate remained around 40, 43. Continuing to diurese.  I have increased her Lasix. Echocardiogram shows increase in pulmonary pressures which are secondary to her hypertension as well as underlying lung disease, asthma.  We will go ahead and consult EP for  evaluation of chronotropic incompetence.  I would like their opinion on potential pacemaker.  Candee Furbish, MD

## 2020-07-30 NOTE — Consult Note (Signed)
ELECTROPHYSIOLOGY CONSULT NOTE  Patient ID: [name], MRN: [id number], DOB/AGE: 01-22-1936 84 y.o. Admit date: 07/29/2020 Date of Consult: 07/30/2020  Primary Physician: [name], [name] Primary Cardiologist: [name] is a 84 y.o. female who is being seen today for the evaluation of bradycardia at the request of MS.     HPI [name] is a 84 y.o. female with PAF, MS mod and pulm  HTN and longstanding bradycardia admitted with shortness of breath progressive over weeks to months.  Exertional.  Recent edema and abd distension.  Chronic 2 pillow orthopnea but no PND  No palps No syncope CXR>> pulm edema'  Thromboembolic risk factors ( age  -2, HTN-, DM-1, Gender-1) for a CHADSVASc Score of >=5 on Apixoban  Dosed appropriately ( age/Cr)    Past Medical History:  Diagnosis Date  . Allergic rhinitis   . Anterior chest wall pain   . Anxiety   . Asthma   . Chronic diastolic CHF (congestive heart failure) (HCC)    Echo 01/2020: EF 60-65, normal wall motion, mild LVH, normal RV SF, RVSP 52.6 (moderate elevation), severe LAE, moderate RAE, trivial MR, mild MS (mean gradient 5.5 mmHg), mild aortic valve sclerosis (no AS); elevated E/e' c/w elevated LVEDP  . Cough   . DM type 2 (diabetes mellitus, type 2) ([facility])   . GERD (gastroesophageal reflux disease)   . History of diverticulitis of colon   . HTN (hypertension)   . Hyperlipidemia   . IBS (irritable bowel syndrome)   . Iron deficiency anemia   . Osteoporosis, unspecified   . Renal insufficiency 07/31/2017  . UTI (urinary tract infection)       Surgical History:  Past Surgical History:  Procedure Laterality Date  . CARDIOVASCULAR STRESS TEST  02/25/04  . ESOPHAGOGASTRODUODENOSCOPY  12/26/01  . RIGHT OOPHORECTOMY  jan 2010     Home Meds: Prior to Admission medications   Medication Sig Start Date End Date Taking? Authorizing Provider  acetaminophen (TYLENOL) 325 MG tablet Take 650 mg by mouth every 6 (six)  hours as needed for pain.   Yes [provider]  albuterol (PROAIR HFA) 108 (90 Base) MCG/ACT inhaler Inhale 2 puffs into the lungs every 4 (four) hours as needed for wheezing. 05/05/19 07/29/20 Yes Tanda Rockers, MD  amLODipine (NORVASC) 5 MG tablet TAKE 1 TABLET BY MOUTH TWICE A DAY Patient taking differently: Take 5 mg by mouth in the morning and at bedtime.  12/29/19  Yes Fay Records, MD  budesonide-formoterol (SYMBICORT) 160-4.5 MCG/ACT inhaler TAKE 2 PUFFS FIRST THING IN AM AND THEN ANOTHER 2 PUFFS ABOUT 12 HOURS LATER. Patient taking differently: Inhale 2 puffs into the lungs every 12 (twelve) hours.  04/15/20  Yes Tanda Rockers, MD  cetirizine (ZYRTEC ALLERGY) 10 MG tablet Take 1 tablet (10 mg total) by mouth daily as needed for allergies. 05/04/20  Yes Tanda Rockers, MD  ELIQUIS 2.5 MG TABS tablet TAKE 1 TABLET BY MOUTH TWICE A DAY Patient taking differently: Take 2.5 mg by mouth 2 (two) times daily.  07/29/20  Yes Fay Records, MD  furosemide (LASIX) 40 MG tablet Take 1 tablet (40 mg total) by mouth daily. 01/23/20 07/29/20 Yes Weaver, Scott T, PA-C  hydrALAZINE (APRESOLINE) 25 MG tablet Take 1 tablet (25 mg total) by mouth 3 (three) times daily. 03/10/20  Yes Weaver, Scott T, PA-C  insulin lispro (HUMALOG) 100 UNIT/ML injection 5 UNITS WITH BREAKFAST, AND 25  UNITS WITH SUPPER Patient taking differently: Inject 5-25 Units into the skin See admin instructions. 5 UNITS WITH BREAKFAST, AND 25 UNITS WITH SUPPER 07/06/20  Yes Renato Shin, MD  losartan (COZAAR) 100 MG tablet Take 1 tablet (100 mg total) by mouth daily. 01/23/20 07/29/20 Yes Weaver, Scott T, PA-C  montelukast (SINGULAIR) 10 MG tablet TAKE 1 TABLET BY MOUTH AT BEDTIME Patient taking differently: Take 10 mg by mouth at bedtime.  12/15/19  Yes Tanda Rockers, MD  omeprazole (PRILOSEC) 40 MG capsule Take 1 capsule (40 mg total) by mouth daily. 05/04/20  Yes Tanda Rockers, MD  potassium chloride (KLOR-CON) 10 MEQ tablet TAKE  1 TABLET BY MOUTH EVERY DAY Patient taking differently: Take 10 mEq by mouth daily.  12/16/19  Yes Fay Records, MD  pravastatin (PRAVACHOL) 40 MG tablet Take 1 tablet (40 mg total) by mouth daily. 08/27/19  Yes [name], FNP  Blood Glucose Monitoring Suppl (ONE TOUCH ULTRA 2) W/DEVICE KIT Check blood sugar 3 times per day. 07/24/14   Renato Shin, MD  nystatin (MYCOSTATIN) 100000 UNIT/ML suspension Take 5 mLs (500,000 Units total) by mouth 4 (four) times daily. Patient not taking: Reported on 07/29/2020 04/30/20   [name], FNP  predniSONE (DELTASONE) 10 MG tablet Take  4 each am x 2 days,   2 each am x 2 days,  1 each am x 2 days and stop Patient not taking: Reported on 07/29/2020 05/04/20   Tanda Rockers, MD    Inpatient Medications:  . amLODipine  5 mg Oral Daily  . apixaban  2.5 mg Oral BID  . furosemide  80 mg Intravenous BID  . guaiFENesin  600 mg Oral BID  . hydrALAZINE  75 mg Oral TID  . insulin aspart  0-15 Units Subcutaneous TID WC  . ipratropium-albuterol  3 mL Nebulization BID  . loratadine  10 mg Oral Daily  . methylPREDNISolone (SOLU-MEDROL) injection  60 mg Intravenous Q12H  . mometasone-formoterol  2 puff Inhalation BID  . montelukast  10 mg Oral QHS  . pantoprazole  40 mg Oral Daily  . pravastatin  40 mg Oral Daily  . sodium chloride flush  3 mL Intravenous Q12H     Allergies:  Allergies  Allergen Reactions  . Aspirin Shortness Of Breath and Other (See Comments)    Caused asthma symptoms  . Ramipril Cough    Social History   Socioeconomic History  . Marital status: Widowed    Spouse name: Not on file  . Number of children: Not on file  . Years of education: Not on file  . Highest education level: Not on file  Occupational History  . Occupation: retired  Tobacco Use  . Smoking status: Former Smoker    Packs/day: 0.30    Years: 5.00    Pack years: 1.50    Types: Cigarettes    Quit date: 10/02/1986    Years since quitting:  33.8  . Smokeless tobacco: Never Used  Vaping Use  . Vaping Use: Never used  Substance and Sexual Activity  . Alcohol use: No  . Drug use: No  . Sexual activity: Not on file  Other Topics Concern  . Not on file  Social History Narrative  . Not on file   Social Determinants of Health   Financial Resource Strain:   . Difficulty of Paying Living Expenses: Not on file  Food Insecurity:   . Worried About Charity fundraiser in the Last  Year: Not on file  . Ran Out of Food in the Last Year: Not on file  Transportation Needs:   . Lack of Transportation (Medical): Not on file  . Lack of Transportation (Non-Medical): Not on file  Physical Activity:   . Days of Exercise per Week: Not on file  . Minutes of Exercise per Session: Not on file  Stress:   . Feeling of Stress : Not on file  Social Connections:   . Frequency of Communication with Friends and Family: Not on file  . Frequency of Social Gatherings with Friends and Family: Not on file  . Attends Religious Services: Not on file  . Active Member of Clubs or Organizations: Not on file  . Attends Archivist Meetings: Not on file  . Marital Status: Not on file  Intimate Partner Violence:   . Fear of Current or Ex-Partner: Not on file  . Emotionally Abused: Not on file  . Physically Abused: Not on file  . Sexually Abused: Not on file     Family History  Problem Relation Age of Onset  . Lung cancer Brother   . Melanoma Brother   . Hypertension Mother   . Stroke Mother   . Other Father        poor circulation  . Atopy Neg Hx   . Asthma Neg Hx   . Breast cancer Neg Hx      ROS:  Please see the history of present illness.     All other systems reviewed and negative.    Physical Exam: Blood pressure (!) 164/60, pulse (!) 41, temperature 98.2 F (36.8 C), temperature source Oral, resp. rate 18, height '5\' 7"'  (1.702 m), weight 84.6 kg, SpO2 99 %. General: Well developed, well nourished female in no acute  distress. Head: Normocephalic, atraumatic, sclera non-icteric, no xanthomas, nares are without discharge. EENT: normal Lymph Nodes:  none Back: without scoliosis/kyphosis , no CVA tendersness Neck: Negative for carotid bruits. JVD jaw. Lungs: Clear bilaterally to auscultation without wheezes, rales, or rhonchi. Breathing is unlabored. Heart: RRR with S1 S2 2/6 murmur  . Abdomen: Soft, non-tender, non-distended with normoactive bowel sounds. No hepatomegaly. No rebound/guarding. No obvious abdominal masses. Msk:  Strength and tone appear normal for age. Extremities: No clubbing or cyanosis. No edema.  Distal pedal pulses are 2+ and equal bilaterally. Skin: Warm and Dry Neuro: Alert and oriented X 3. CN III-XII intact Grossly normal sensory and motor function . Psych:  Responds to questions appropriately with a normal affect.      Labs: Cardiac Enzymes No results for input(s): CKTOTAL, CKMB, TROPONINI in the last 72 hours. CBC Lab Results  Component Value Date   WBC 6.6 07/30/2020   HGB 10.6 (L) 07/30/2020   HCT 30.7 (L) 07/30/2020   MCV 79.9 (L) 07/30/2020   PLT 168 07/30/2020   PROTIME: No results for input(s): LABPROT, INR in the last 72 hours. Chemistry  Recent Labs  Lab 07/30/20 0357  NA 136  K 4.3  CL 107  CO2 17*  BUN 30*  CREATININE 2.06*  CALCIUM 9.5  PROT 6.5  BILITOT 1.1  ALKPHOS 74  ALT 44  AST 25  GLUCOSE 215*   Lipids Lab Results  Component Value Date   CHOL 104 08/27/2019   HDL 44.90 08/27/2019   LDLCALC 47 08/27/2019   TRIG 59.0 08/27/2019   BNP No results found for: PROBNP Thyroid Function Tests: Recent Labs    07/29/20 1046  TSH 3.47  Miscellaneous Lab Results  Component Value Date   DDIMER 2.15 (H) 12/24/2019    Radiology/Studies:  DG Chest 2 View  Result Date: 07/29/2020 CLINICAL DATA:  Shortness of breath. EXAM: CHEST - 2 VIEW COMPARISON:  None. FINDINGS: The cardio pericardial silhouette is enlarged. There is  pulmonary vascular congestion without overt pulmonary edema. No focal airspace consolidation. The visualized bony structures of the thorax show no acute abnormality. IMPRESSION: Enlarged cardiopericardial silhouette with pulmonary vascular congestion. Possible interstitial edema. Electronically Signed   By: Misty Stanley M.D.   On: 07/29/2020 12:11   ECHOCARDIOGRAM COMPLETE  Result Date: 07/29/2020    ECHOCARDIOGRAM REPORT   Patient Name:   SKYLEIGH WINDLE Date of Exam: 07/29/2020 Medical Rec #:  [id number]   Height:       67.0 in Accession #:    [id number]  Weight:       188.1 lb Date of Birth:  Apr 28, 1936    BSA:          1.970 m Patient Age:    54 years    BP:           196/59 mmHg Patient Gender: F           HR:           38 bpm. Exam Location:  Inpatient Procedure: 2D Echo Indications:    CHF-Acute Diastolic 338.25 / K53.97  History:        Patient has prior history of Echocardiogram examinations, most                 recent 01/21/2020. Risk Factors:Diabetes, Hypertension and                 Dyslipidemia. Anemic. Chronic kidney disease. Asthma.  Sonographer:    Darlina Sicilian RDCS Referring Phys: [place]  1. Left ventricular ejection fraction, by estimation, is 65 to 70%. The left ventricle has hyperdynamic function. The left ventricle has no regional wall motion abnormalities. There is mild left ventricular hypertrophy. Left ventricular diastolic parameters are indeterminate.  2. Right ventricular systolic function is normal. The right ventricular size is normal. There is severely elevated pulmonary artery systolic pressure. The estimated right ventricular systolic pressure is 67.3 mmHg.  3. Left atrial size was moderately dilated.  4. Right atrial size was mildly dilated.  5. The mitral valve is degenerative. No evidence of mitral valve regurgitation. Moderate mitral stenosis. The mean mitral valve gradient is 7.0 mmHg. Moderate mitral annular calcification.  6. Tricuspid valve  regurgitation is moderate.  7. The aortic valve is tricuspid. Aortic valve regurgitation is not visualized. Mild aortic valve sclerosis is present, with no evidence of aortic valve stenosis.  8. The inferior vena cava is dilated in size with <50% respiratory variability, suggesting right atrial pressure of 15 mmHg.  9. Technically difficult study with poor acoustic windows. FINDINGS  Left Ventricle: Left ventricular ejection fraction, by estimation, is 65 to 70%. The left ventricle has hyperdynamic function. The left ventricle has no regional wall motion abnormalities. The left ventricular internal cavity size was normal in size. There is mild left ventricular hypertrophy. Left ventricular diastolic parameters are indeterminate. Right Ventricle: The right ventricular size is normal. No increase in right ventricular wall thickness. Right ventricular systolic function is normal. There is severely elevated pulmonary artery systolic pressure. The tricuspid regurgitant velocity is 3.63 m/s, and with an assumed right atrial pressure of 15 mmHg, the estimated right ventricular systolic pressure is 41.9 mmHg.  Left Atrium: Left atrial size was moderately dilated. Right Atrium: Right atrial size was mildly dilated. Pericardium: Trivial pericardial effusion is present. Mitral Valve: The mitral valve is degenerative in appearance. There is moderate calcification of the mitral valve leaflet(s). Moderate mitral annular calcification. No evidence of mitral valve regurgitation. Moderate mitral valve stenosis. MV peak gradient, 23.0 mmHg. The mean mitral valve gradient is 7.0 mmHg. Tricuspid Valve: The tricuspid valve is normal in structure. Tricuspid valve regurgitation is moderate. Aortic Valve: The aortic valve is tricuspid. Aortic valve regurgitation is not visualized. Mild aortic valve sclerosis is present, with no evidence of aortic valve stenosis. Aortic valve mean gradient measures 6.0 mmHg. Aortic valve peak gradient  measures 17.5 mmHg. Aortic valve area, by VTI measures 1.42 cm. Pulmonic Valve: The pulmonic valve was normal in structure. Pulmonic valve regurgitation is trivial. Aorta: The aortic root is normal in size and structure. Venous: The inferior vena cava is dilated in size with less than 50% respiratory variability, suggesting right atrial pressure of 15 mmHg. IAS/Shunts: No atrial level shunt detected by color flow Doppler.  LEFT VENTRICLE PLAX 2D LVIDd:         4.90 cm LVIDs:         2.60 cm LV PW:         1.00 cm LV IVS:        1.10 cm LVOT diam:     1.60 cm LV SV:         63 LV SV Index:   32 LVOT Area:     2.01 cm  RIGHT VENTRICLE RV S prime:     11.40 cm/s LEFT ATRIUM             Index       RIGHT ATRIUM           Index LA diam:        4.80 cm 2.44 cm/m  RA Area:     16.30 cm LA Vol (A2C):   72.0 ml 36.55 ml/m RA Volume:   35.20 ml  17.87 ml/m LA Vol (A4C):   79.3 ml 40.25 ml/m LA Biplane Vol: 80.1 ml 40.66 ml/m  AORTIC VALVE AV Area (Vmax):    1.45 cm AV Area (Vmean):   1.60 cm AV Area (VTI):     1.42 cm AV Vmax:           209.00 cm/s AV Vmean:          110.000 cm/s AV VTI:            0.440 m AV Peak Grad:      17.5 mmHg AV Mean Grad:      6.0 mmHg LVOT Vmax:         151.00 cm/s LVOT Vmean:        87.700 cm/s LVOT VTI:          0.311 m LVOT/AV VTI ratio: 0.71  AORTA Ao Root diam: 2.30 cm MITRAL VALVE                TRICUSPID VALVE MV Area (PHT): 2.68 cm     TR Peak grad:   52.7 mmHg MV Peak grad:  23.0 mmHg    TR Vmax:        363.00 cm/s MV Mean grad:  7.0 mmHg MV Vmax:       2.40 m/s     SHUNTS MV Vmean:      106.7 cm/s   Systemic VTI:  0.31 m MV  Decel Time: 283 msec     Systemic Diam: 1.60 cm MV E velocity: 236.67 cm/s Loralie Champagne MD Electronically signed by Loralie Champagne MD Signature Date/Time: 07/29/2020/5:41:46 PM    Final     EKG: atrial fibrillation RBBBRAD Bradycardia with min variation of RR interval to suggest complete heart block with junctional escape      Assessment and  Plan:  HFpEF  Atrial fibrillation permanent  PAH-severe /MS-mod   Complete heart block -- junctional escape  Bradycardia with out chronotropic response  Renal insuff  Estimated Creatinine Clearance: 23.5 mL/min (A) (by C-G formula based on SCr of 2.06 mg/dL (H)). gd 4  HTN     The pt has longstanding bradycardia with rates over the last 4 years from 40''s--80   Now with HR low 40's without variation in RR interval or chronotropic response suggestive of evolving complete heart block.  With QRS morphology similar over recent years would suggest junctional escape  Notwithstanding PAH and MS  She would benefit from some degree of chronotropic competence, but  Will necessarily be limited to allow trans mitral inflow.  >> pacing indicated   The benefits and risks were reviewed including but not limited to death,  perforation, infection, lead dislodgement and device malfunction.  The patient understands agrees and is willing to proceed.  wll attempt LBBB area pacing   Will add hydralazine for BP, continue diuresis    Virl Axe

## 2020-07-30 NOTE — Evaluation (Signed)
Physical Therapy Evaluation Patient Details Name: [name] MRN: [id number] DOB: 29-Jul-1936 Today's Date: 07/30/2020   History of Present Illness  84 y.o. female with a hx of permanent atrial fibrillation, CAD, chronic diastolic CHF, DM, HLD, HTN and lung nodules who presents for examination of bradycardia.  Clinical Impression  Prior to admission, pt lives with her daughter in a one story town house and is independent with ADL's/mobility. Pt presents with decreased functional mobility secondary to decreased cardiopulmonary endurance, balance deficits and decreased gait speed. Pt ambulating 25 ft, then 75 ft with a seated rest break in between, no assistive device, and supervision for safety. Demonstrates slow gait speed and fatigues easily. HR highly irregular, mostly 44-54 bpm, but towards end of walk went up to 105 bpm briefly. SpO2 99-100% on RA, BP 201/62 post mobility. Pt would like to avoid using an assistive device currently if possible. Recommended HHPT to address deficits and maximize functional independence.     Follow Up Recommendations Home health PT;Supervision for mobility/OOB    Equipment Recommendations  None recommended by PT    Recommendations for Other Services       Precautions / Restrictions Precautions Precautions: Fall;Other (comment) Precaution Comments: watch HR, BP Restrictions Weight Bearing Restrictions: No      Mobility  Bed Mobility Overal bed mobility: Independent                  Transfers Overall transfer level: Independent Equipment used: None                Ambulation/Gait Ambulation/Gait assistance: Supervision Gait Distance (Feet): 100 Feet (25", 75") Assistive device: None Gait Pattern/deviations: Step-through pattern;Decreased stride length Gait velocity: decreased Gait velocity interpretation: <1.31 ft/sec, indicative of household ambulator General Gait Details: Pt ambulated to and from bathroom with a seated rest  break, then 75 ft in hallway. Very slow pace, decreased step length and reciprocal arm swing  Stairs            Wheelchair Mobility    Modified Rankin (Stroke Patients Only)       Balance Overall balance assessment: Mild deficits observed, not formally tested                                           Pertinent Vitals/Pain Pain Assessment: No/denies pain    Home Living Family/patient expects to be discharged to:: Private residence Living Arrangements: Children (daughter) Available Help at Discharge: Family;Available 24 hours/day Type of Home: House Home Access: Level entry     Home Layout: One level Home Equipment: None      Prior Function Level of Independence: Independent               Hand Dominance        Extremity/Trunk Assessment   Upper Extremity Assessment Upper Extremity Assessment: Overall WFL for tasks assessed    Lower Extremity Assessment Lower Extremity Assessment: Overall WFL for tasks assessed    Cervical / Trunk Assessment Cervical / Trunk Assessment: Normal  Communication   Communication: No difficulties  Cognition Arousal/Alertness: Awake/alert Behavior During Therapy: WFL for tasks assessed/performed Overall Cognitive Status: Within Functional Limits for tasks assessed  General Comments      Exercises     Assessment/Plan    PT Assessment Patient needs continued PT services  PT Problem List Decreased strength;Decreased activity tolerance;Decreased balance;Decreased mobility;Cardiopulmonary status limiting activity       PT Treatment Interventions Gait training;Stair training;Therapeutic activities;Functional mobility training;Therapeutic exercise;Balance training;Patient/family education    PT Goals (Current goals can be found in the Care Plan section)  Acute Rehab PT Goals Patient Stated Goal: not have to use a walker PT Goal Formulation:  With patient Time For Goal Achievement: 08/13/20 Potential to Achieve Goals: Good    Frequency Min 3X/week   Barriers to discharge        Co-evaluation               AM-PAC PT "6 Clicks" Mobility  Outcome Measure Help needed turning from your back to your side while in a flat bed without using bedrails?: None Help needed moving from lying on your back to sitting on the side of a flat bed without using bedrails?: None Help needed moving to and from a bed to a chair (including a wheelchair)?: None Help needed standing up from a chair using your arms (e.g., wheelchair or bedside chair)?: None Help needed to walk in hospital room?: None Help needed climbing 3-5 steps with a railing? : A Little 6 Click Score: 23    End of Session   Activity Tolerance: Patient tolerated treatment well Patient left: in bed;with call bell/phone within reach Nurse Communication: Mobility status PT Visit Diagnosis: Unsteadiness on feet (R26.81);Difficulty in walking, not elsewhere classified (R26.2)    Time: 4715-9539 PT Time Calculation (min) (ACUTE ONLY): 21 min   Charges:   PT Evaluation $PT Eval Moderate Complexity: 1 Mod          Wyona Almas, PT, DPT Acute Rehabilitation Services Pager [phone] Office [phone]   [name] 07/30/2020, 12:01 PM

## 2020-07-30 NOTE — Progress Notes (Signed)
PROGRESS NOTE    [name]  TIW:[id number] DOB: [birth year] DOA: 07/29/2020 PCP: [name], FNP   Brief Narrative:  HPI On 07/29/2020 by Dr. Florina Ou [name] is a 84 y.o. female with medical history significant of SOB.  Patient is noted to have a past medical history of hypertension, chronic diastolic heart failure with last echocardiogram done in April of this year showing a ejection fraction of 60 to 65%, with severe left atrial enlargement and right atrial enlargement.  Patient also has past medical history of diabetes mellitus type 2, anemia of chronic disease, chronic kidney disease, asthma.  Interim history Patient admitted with shortness of breath, likely secondary to CHF as well as COPD exacerbation.  Found to have atrial fibrillation with bradycardia.  Cardiology consulted.  Patient may need EP consultation. Assessment & Plan   Dyspnea  -Likely secondary to multifactorial causes including CHF as well as COPD/asthma versus PAH -Echocardiogram as stated below -Patient noted to have severe PAH -Continue to treat underlying causes -Currently on room air and maintaining oxygen saturations in the 90s  Permanent atrial fibrillation with bradycardia/low ventricular response -Patient tells me that she was noted to have bradycardia when she was seeing her endocrinologist earlier this week as an outpatient and was told to go to the emergency department. -Heart rate has been in the 30s on telemetry -Patient is on Eliquis for atrial fibrillation -Cardiology consulted and appreciated -May need EP consultation  Chronic diastolic heart failure with mild exacerbation -Patient presented with shortness of breath -BNP was mildly elevated at 3 7 -Chest x-ray did show vascular congestion and mild interstitial edema.  She also had lower extremity edema on examination -Placed on IV Lasix 40 mg twice daily -Echocardiogram EF of 65 to 70%, no regional wall motion abnormalities.   Mild LVH H.  Diastolic parameters indeterminate.  Severely elevated pulmonary artery systolic pressure, right ventricular systolic pressure 25.0 mmHg -Monitor intake and output, daily weight  COPD/asthma -Patient placed on IV Solu-Medrol -She does not have any wheezing noted today -Will likely transition to oral prednisone and continue nebulizer treatments  Hypertensive urgency -Losartan was held due to creatinine -Continue hydralazine and amlodipine -BP 164/60 today  CKD, stage IV -Baseline creatinine appears to range from approximately 2-however has ranged up to 2.5 earlier this year -Creatinine currently 2.06 -Continue to monitor BMP  Diabetes mellitus, type II -Hemoglobin A1c 5.2 -Continue insulin sliding scale with CBG monitoring  DVT Prophylaxis Eliquis  Code Status: Full  Family Communication: None at bedside  Disposition Plan:  Status is: Inpatient  Remains inpatient appropriate because:Ongoing diagnostic testing needed not appropriate for outpatient work up and IV treatments appropriate due to intensity of illness or inability to take PO   Dispo: The patient is from: Home              Anticipated d/c is to: Home              Anticipated d/c date is: 3 days              Patient currently is not medically stable to d/c.   Consultants Cardiology  Procedures  Echocardiogram  Antibiotics   Anti-infectives (From admission, onward)   None      Subjective:   [name] seen and examined today.  Patient denies current chest pain or shortness of breath.  Feeling much better than when she first came to the hospital.  States that she does have asthma and has  had it for some time.  She states she does not have a nebulizer at home.  Denies current abdominal pain, nausea or vomiting, diarrhea or constipation, dizziness or headache.  Objective:   Vitals:   07/30/20 0821 07/30/20 0823 07/30/20 0847 07/30/20 1139  BP: (!) 177/62 (!) 167/69  (!) 164/60  Pulse: (!)  38 (!) 38 (!) 40 (!) 41  Resp: 18  18 18   Temp: 98.3 F (36.8 C)   98.2 F (36.8 C)  TempSrc: Oral   Oral  SpO2: 100%   99%  Weight:      Height:        Intake/Output Summary (Last 24 hours) at 07/30/2020 1402 Last data filed at 07/30/2020 0345 Gross per 24 hour  Intake 240 ml  Output 1400 ml  Net -1160 ml   Filed Weights   07/29/20 1117 07/30/20 0041  Weight: 85.3 kg 84.6 kg    Exam  General: Well developed, well nourished, NAD, appears stated age  65: NCAT,  mucous membranes moist.   Cardiovascular: S1 S2 auscultated, irregular  Respiratory: Clear to auscultation bilaterally, currently no wheezing  Abdomen: Soft, nontender, nondistended, + bowel sounds  Extremities: warm dry without cyanosis clubbing or edema  Neuro: AAOx3, nonfocal  Psych: appropriate mood and affect, pleasant   Data Reviewed: I have personally reviewed following labs and imaging studies  CBC: Recent Labs  Lab 07/29/20 1121 07/30/20 0357  WBC 8.4 6.6  HGB 10.6* 10.6*  HCT 31.5* 30.7*  MCV 82.0 79.9*  PLT 162 585   Basic Metabolic Panel: Recent Labs  Lab 07/29/20 1121 07/30/20 0357  NA 142 136  K 4.0 4.3  CL 114* 107  CO2 17* 17*  GLUCOSE 163* 215*  BUN 27* 30*  CREATININE 2.01* 2.06*  CALCIUM 9.1 9.5   GFR: Estimated Creatinine Clearance: 23.5 mL/min (A) (by C-G formula based on SCr of 2.06 mg/dL (H)). Liver Function Tests: Recent Labs  Lab 07/30/20 0357  AST 25  ALT 44  ALKPHOS 74  BILITOT 1.1  PROT 6.5  ALBUMIN 3.5   No results for input(s): LIPASE, AMYLASE in the last 168 hours. No results for input(s): AMMONIA in the last 168 hours. Coagulation Profile: No results for input(s): INR, PROTIME in the last 168 hours. Cardiac Enzymes: No results for input(s): CKTOTAL, CKMB, CKMBINDEX, TROPONINI in the last 168 hours. BNP (last 3 results) No results for input(s): PROBNP in the last 8760 hours. HbA1C: Recent Labs    07/29/20 1704  HGBA1C 5.2    CBG: Recent Labs  Lab 07/29/20 1657 07/29/20 2213 07/30/20 0626 07/30/20 1143  GLUCAP 170* 233* 201* 174*   Lipid Profile: No results for input(s): CHOL, HDL, LDLCALC, TRIG, CHOLHDL, LDLDIRECT in the last 72 hours. Thyroid Function Tests: Recent Labs    07/29/20 1046  TSH 3.47  FREET4 0.99   Anemia Panel: No results for input(s): VITAMINB12, FOLATE, FERRITIN, TIBC, IRON, RETICCTPCT in the last 72 hours. Urine analysis:    Component Value Date/Time   COLORURINE YELLOW 12/19/2019 1800   APPEARANCEUR CLEAR 12/19/2019 1800   LABSPEC 1.025 12/19/2019 1800   PHURINE 5.0 12/19/2019 1800   GLUCOSEU NEGATIVE 12/19/2019 1800   GLUCOSEU NEGATIVE 06/01/2017 0908   HGBUR NEGATIVE 12/19/2019 1800   HGBUR negative 09/28/2007 0933   BILIRUBINUR SMALL (A) 12/19/2019 1800   KETONESUR 15 (A) 12/19/2019 1800   PROTEINUR 30 (A) 12/19/2019 1800   UROBILINOGEN 0.2 06/01/2017 0908   NITRITE NEGATIVE 12/19/2019 1800   LEUKOCYTESUR TRACE (  A) 12/19/2019 1800   Sepsis Labs: @LABRCNTIP (procalcitonin:4,lacticidven:4)  ) Recent Results (from the past 240 hour(s))  Respiratory Panel by RT PCR (Flu A&B, Covid) - Nasopharyngeal Swab     Status: None   Collection Time: 07/29/20 12:11 PM   Specimen: Nasopharyngeal Swab  Result Value Ref Range Status   SARS Coronavirus 2 by RT PCR NEGATIVE NEGATIVE Final    Comment: (NOTE) SARS-CoV-2 target nucleic acids are NOT DETECTED.  The SARS-CoV-2 RNA is generally detectable in upper respiratoy specimens during the acute phase of infection. The lowest concentration of SARS-CoV-2 viral copies this assay can detect is 131 copies/mL. A negative result does not preclude SARS-Cov-2 infection and should not be used as the sole basis for treatment or other patient management decisions. A negative result may occur with  improper specimen collection/handling, submission of specimen other than nasopharyngeal swab, presence of viral mutation(s) within the areas  targeted by this assay, and inadequate number of viral copies (<131 copies/mL). A negative result must be combined with clinical observations, patient history, and epidemiological information. The expected result is Negative.  Fact Sheet for Patients:  PinkCheek.be  Fact Sheet for Healthcare Providers:  GravelBags.it  This test is no t yet approved or cleared by the [place] FDA and  has been authorized for detection and/or diagnosis of SARS-CoV-2 by FDA under an Emergency Use Authorization (EUA). This EUA will remain  in effect (meaning this test can be used) for the duration of the COVID-19 declaration under Section 564(b)(1) of the Act, 21 U.S.C. section 360bbb-3(b)(1), unless the authorization is terminated or revoked sooner.     Influenza A by PCR NEGATIVE NEGATIVE Final   Influenza B by PCR NEGATIVE NEGATIVE Final    Comment: (NOTE) The Xpert Xpress SARS-CoV-2/FLU/RSV assay is intended as an aid in  the diagnosis of influenza from Nasopharyngeal swab specimens and  should not be used as a sole basis for treatment. Nasal washings and  aspirates are unacceptable for Xpert Xpress SARS-CoV-2/FLU/RSV  testing.  Fact Sheet for Patients: PinkCheek.be  Fact Sheet for Healthcare Providers: GravelBags.it  This test is not yet approved or cleared by the [place] FDA and  has been authorized for detection and/or diagnosis of SARS-CoV-2 by  FDA under an Emergency Use Authorization (EUA). This EUA will remain  in effect (meaning this test can be used) for the duration of the  Covid-19 declaration under Section 564(b)(1) of the Act, 21  U.S.C. section 360bbb-3(b)(1), unless the authorization is  terminated or revoked. Performed at [facility], [place] [address] [place], [place] [address]       Radiology Studies: DG Chest 2 View  Result Date:  07/29/2020 CLINICAL DATA:  Shortness of breath. EXAM: CHEST - 2 VIEW COMPARISON:  None. FINDINGS: The cardio pericardial silhouette is enlarged. There is pulmonary vascular congestion without overt pulmonary edema. No focal airspace consolidation. The visualized bony structures of the thorax show no acute abnormality. IMPRESSION: Enlarged cardiopericardial silhouette with pulmonary vascular congestion. Possible interstitial edema. Electronically Signed   By: Misty Stanley M.D.   On: 07/29/2020 12:11   ECHOCARDIOGRAM COMPLETE  Result Date: 07/29/2020    ECHOCARDIOGRAM REPORT   Patient Name:   [name] Date of Exam: 07/29/2020 Medical Rec #:  [id number]   Height:       67.0 in Accession #:    [id number]  Weight:       188.1 lb Date of Birth:  [birth year]    BSA:  1.970 m Patient Age:    26 years    BP:           196/59 mmHg Patient Gender: F           HR:           38 bpm. Exam Location:  Inpatient Procedure: 2D Echo Indications:    CHF-Acute Diastolic 585.27 / P82.42  History:        Patient has prior history of Echocardiogram examinations, most                 recent 01/21/2020. Risk Factors:Diabetes, Hypertension and                 Dyslipidemia. Anemic. Chronic kidney disease. Asthma.  Sonographer:    Darlina Sicilian RDCS Referring Phys: [place]  1. Left ventricular ejection fraction, by estimation, is 65 to 70%. The left ventricle has hyperdynamic function. The left ventricle has no regional wall motion abnormalities. There is mild left ventricular hypertrophy. Left ventricular diastolic parameters are indeterminate.  2. Right ventricular systolic function is normal. The right ventricular size is normal. There is severely elevated pulmonary artery systolic pressure. The estimated right ventricular systolic pressure is 35.3 mmHg.  3. Left atrial size was moderately dilated.  4. Right atrial size was mildly dilated.  5. The mitral valve is degenerative. No evidence of mitral  valve regurgitation. Moderate mitral stenosis. The mean mitral valve gradient is 7.0 mmHg. Moderate mitral annular calcification.  6. Tricuspid valve regurgitation is moderate.  7. The aortic valve is tricuspid. Aortic valve regurgitation is not visualized. Mild aortic valve sclerosis is present, with no evidence of aortic valve stenosis.  8. The inferior vena cava is dilated in size with <50% respiratory variability, suggesting right atrial pressure of 15 mmHg.  9. Technically difficult study with poor acoustic windows. FINDINGS  Left Ventricle: Left ventricular ejection fraction, by estimation, is 65 to 70%. The left ventricle has hyperdynamic function. The left ventricle has no regional wall motion abnormalities. The left ventricular internal cavity size was normal in size. There is mild left ventricular hypertrophy. Left ventricular diastolic parameters are indeterminate. Right Ventricle: The right ventricular size is normal. No increase in right ventricular wall thickness. Right ventricular systolic function is normal. There is severely elevated pulmonary artery systolic pressure. The tricuspid regurgitant velocity is 3.63 m/s, and with an assumed right atrial pressure of 15 mmHg, the estimated right ventricular systolic pressure is 61.4 mmHg. Left Atrium: Left atrial size was moderately dilated. Right Atrium: Right atrial size was mildly dilated. Pericardium: Trivial pericardial effusion is present. Mitral Valve: The mitral valve is degenerative in appearance. There is moderate calcification of the mitral valve leaflet(s). Moderate mitral annular calcification. No evidence of mitral valve regurgitation. Moderate mitral valve stenosis. MV peak gradient, 23.0 mmHg. The mean mitral valve gradient is 7.0 mmHg. Tricuspid Valve: The tricuspid valve is normal in structure. Tricuspid valve regurgitation is moderate. Aortic Valve: The aortic valve is tricuspid. Aortic valve regurgitation is not visualized. Mild aortic  valve sclerosis is present, with no evidence of aortic valve stenosis. Aortic valve mean gradient measures 6.0 mmHg. Aortic valve peak gradient measures 17.5 mmHg. Aortic valve area, by VTI measures 1.42 cm. Pulmonic Valve: The pulmonic valve was normal in structure. Pulmonic valve regurgitation is trivial. Aorta: The aortic root is normal in size and structure. Venous: The inferior vena cava is dilated in size with less than 50% respiratory variability, suggesting right atrial pressure  of 15 mmHg. IAS/Shunts: No atrial level shunt detected by color flow Doppler.  LEFT VENTRICLE PLAX 2D LVIDd:         4.90 cm LVIDs:         2.60 cm LV PW:         1.00 cm LV IVS:        1.10 cm LVOT diam:     1.60 cm LV SV:         63 LV SV Index:   32 LVOT Area:     2.01 cm  RIGHT VENTRICLE RV S prime:     11.40 cm/s LEFT ATRIUM             Index       RIGHT ATRIUM           Index LA diam:        4.80 cm 2.44 cm/m  RA Area:     16.30 cm LA Vol (A2C):   72.0 ml 36.55 ml/m RA Volume:   35.20 ml  17.87 ml/m LA Vol (A4C):   79.3 ml 40.25 ml/m LA Biplane Vol: 80.1 ml 40.66 ml/m  AORTIC VALVE AV Area (Vmax):    1.45 cm AV Area (Vmean):   1.60 cm AV Area (VTI):     1.42 cm AV Vmax:           209.00 cm/s AV Vmean:          110.000 cm/s AV VTI:            0.440 m AV Peak Grad:      17.5 mmHg AV Mean Grad:      6.0 mmHg LVOT Vmax:         151.00 cm/s LVOT Vmean:        87.700 cm/s LVOT VTI:          0.311 m LVOT/AV VTI ratio: 0.71  AORTA Ao Root diam: 2.30 cm MITRAL VALVE                TRICUSPID VALVE MV Area (PHT): 2.68 cm     TR Peak grad:   52.7 mmHg MV Peak grad:  23.0 mmHg    TR Vmax:        363.00 cm/s MV Mean grad:  7.0 mmHg MV Vmax:       2.40 m/s     SHUNTS MV Vmean:      106.7 cm/s   Systemic VTI:  0.31 m MV Decel Time: 283 msec     Systemic Diam: 1.60 cm MV E velocity: 236.67 cm/s Loralie Champagne MD Electronically signed by Loralie Champagne MD Signature Date/Time: 07/29/2020/5:41:46 PM    Final      Scheduled Meds: .  amLODipine  5 mg Oral Daily  . apixaban  2.5 mg Oral BID  . furosemide  80 mg Intravenous BID  . guaiFENesin  600 mg Oral BID  . hydrALAZINE  75 mg Oral TID  . insulin aspart  0-15 Units Subcutaneous TID WC  . ipratropium-albuterol  3 mL Nebulization BID  . loratadine  10 mg Oral Daily  . methylPREDNISolone (SOLU-MEDROL) injection  60 mg Intravenous Q12H  . mometasone-formoterol  2 puff Inhalation BID  . montelukast  10 mg Oral QHS  . pantoprazole  40 mg Oral Daily  . pravastatin  40 mg Oral Daily  . sodium chloride flush  3 mL Intravenous Q12H   Continuous Infusions: . sodium chloride       LOS:  1 day   Time Spent in minutes   45 minutes  Viera Okonski D.O. on 07/30/2020 at 2:02 PM  Between 7am to 7pm - Please see pager noted on [email or web address]  After 7pm go to [email or web address]  And look for the night coverage person covering for me after hours  [facility] Office  [phone]

## 2020-07-31 DIAGNOSIS — R0602 Shortness of breath: Secondary | ICD-10-CM | POA: Diagnosis not present

## 2020-07-31 DIAGNOSIS — I509 Heart failure, unspecified: Secondary | ICD-10-CM

## 2020-07-31 DIAGNOSIS — I480 Paroxysmal atrial fibrillation: Secondary | ICD-10-CM | POA: Diagnosis not present

## 2020-07-31 LAB — BASIC METABOLIC PANEL
Anion gap: 11 (ref 5–15)
BUN: 46 mg/dL — ABNORMAL HIGH (ref 8–23)
CO2: 21 mmol/L — ABNORMAL LOW (ref 22–32)
Calcium: 9.3 mg/dL (ref 8.9–10.3)
Chloride: 106 mmol/L (ref 98–111)
Creatinine, Ser: 2.51 mg/dL — ABNORMAL HIGH (ref 0.44–1.00)
GFR, Estimated: 19 mL/min — ABNORMAL LOW (ref >60)
Glucose, Bld: 205 mg/dL — ABNORMAL HIGH (ref 70–99)
Potassium: 4.4 mmol/L (ref 3.5–5.1)
Sodium: 138 mmol/L (ref 135–145)

## 2020-07-31 LAB — CBC
HCT: 30.9 % — ABNORMAL LOW (ref 36.0–46.0)
Hemoglobin: 10.5 g/dL — ABNORMAL LOW (ref 12.0–15.0)
MCH: 27.6 pg (ref 26.0–34.0)
MCHC: 34 g/dL (ref 30.0–36.0)
MCV: 81.1 fL (ref 80.0–100.0)
Platelets: 165 10*3/uL (ref 150–400)
RBC: 3.81 MIL/uL — ABNORMAL LOW (ref 3.87–5.11)
RDW: 15.2 % (ref 11.5–15.5)
WBC: 9.3 10*3/uL (ref 4.0–10.5)
nRBC: 0.3 % — ABNORMAL HIGH (ref 0.0–0.2)

## 2020-07-31 LAB — GLUCOSE, CAPILLARY
Glucose-Capillary: 205 mg/dL — ABNORMAL HIGH (ref 70–99)
Glucose-Capillary: 258 mg/dL — ABNORMAL HIGH (ref 70–99)
Glucose-Capillary: 159 mg/dL — ABNORMAL HIGH (ref 70–99)
Glucose-Capillary: 249 mg/dL — ABNORMAL HIGH (ref 70–99)

## 2020-07-31 MED ORDER — HYDRALAZINE HCL 20 MG/ML IJ SOLN
10.0000 mg | Freq: Once | INTRAMUSCULAR | Status: AC
  Administered 2020-07-31: 10 mg via INTRAVENOUS
  Filled 2020-07-31: qty 1

## 2020-07-31 MED ORDER — IPRATROPIUM-ALBUTEROL 0.5-2.5 (3) MG/3ML IN SOLN
3.0000 mL | Freq: Four times a day (QID) | RESPIRATORY_TRACT | Status: DC | PRN
  Administered 2020-07-31: 3 mL via RESPIRATORY_TRACT

## 2020-07-31 NOTE — Progress Notes (Signed)
Progress Note  Patient Name: [name] Date of Encounter: 07/31/2020  Primary Cardiologist: Dorris Carnes, MD   Subjective   Doing well this AM. PPM procedure related questions answered.  Inpatient Medications    Scheduled Meds: . amLODipine  5 mg Oral Daily  . apixaban  2.5 mg Oral BID  . furosemide  80 mg Intravenous BID  . gentamicin irrigation  80 mg Irrigation On Call  . guaiFENesin  600 mg Oral BID  . hydrALAZINE  75 mg Oral TID  . insulin aspart  0-15 Units Subcutaneous TID WC  . loratadine  10 mg Oral Daily  . mometasone-formoterol  2 puff Inhalation BID  . montelukast  10 mg Oral QHS  . pantoprazole  40 mg Oral Daily  . pravastatin  40 mg Oral Daily  . sodium chloride flush  3 mL Intravenous Q12H   Continuous Infusions: . sodium chloride    . sodium chloride    . sodium chloride    .  ceFAZolin (ANCEF) IV     PRN Meds: sodium chloride, acetaminophen **OR** acetaminophen, albuterol, ipratropium-albuterol, oxyCODONE, sodium chloride flush   Vital Signs    Vitals:   07/31/20 0000 07/31/20 0200 07/31/20 0443 07/31/20 0800  BP: (!) 148/65 (!) 171/59 (!) 152/47 (!) 155/64  Pulse: (!) 38 (!) 38 (!) 55 (!) 38  Resp: 18 16 18 20   Temp: 98.8 F (37.1 C) 98.6 F (37 C) 98.3 F (36.8 C) 98.1 F (36.7 C)  TempSrc: Oral Oral Oral Oral  SpO2: 97% 97% 100% 100%  Weight:   82.2 kg   Height:        Intake/Output Summary (Last 24 hours) at 07/31/2020 0851 Last data filed at 07/31/2020 0300 Gross per 24 hour  Intake --  Output 2 ml  Net -2 ml   Filed Weights   07/29/20 1117 07/30/20 0041 07/31/20 0443  Weight: 85.3 kg 84.6 kg 82.2 kg    Telemetry    Personally Reviewed  ECG      Physical Exam   GEN: No acute distress.   Neck: No JVD Cardiac: irregularly irregular, no murmurs, rubs, or gallops.  Respiratory: Clear to auscultation bilaterally. GI: Soft, nontender, non-distended  MS: No edema; No deformity. Neuro:  Nonfocal  Psych: Normal  affect   Labs    Chemistry Recent Labs  Lab 07/29/20 1121 07/30/20 0357 07/31/20 0604  NA 142 136 138  K 4.0 4.3 4.4  CL 114* 107 106  CO2 17* 17* 21*  GLUCOSE 163* 215* 205*  BUN 27* 30* 46*  CREATININE 2.01* 2.06* 2.51*  CALCIUM 9.1 9.5 9.3  PROT  --  6.5  --   ALBUMIN  --  3.5  --   AST  --  25  --   ALT  --  44  --   ALKPHOS  --  74  --   BILITOT  --  1.1  --   GFRNONAA 24* 24* 19*  ANIONGAP 11 12 11      Hematology Recent Labs  Lab 07/29/20 1121 07/30/20 0357 07/31/20 0604  WBC 8.4 6.6 9.3  RBC 3.84* 3.84* 3.81*  HGB 10.6* 10.6* 10.5*  HCT 31.5* 30.7* 30.9*  MCV 82.0 79.9* 81.1  MCH 27.6 27.6 27.6  MCHC 33.7 34.5 34.0  RDW 15.9* 15.4 15.2  PLT 162 168 165    Cardiac EnzymesNo results for input(s): TROPONINI in the last 168 hours. No results for input(s): TROPIPOC in the last 168 hours.  BNP Recent Labs  Lab 07/29/20 1121  BNP 371.9*     DDimer No results for input(s): DDIMER in the last 168 hours.   Radiology    DG Chest 2 View  Result Date: 07/29/2020 CLINICAL DATA:  Shortness of breath. EXAM: CHEST - 2 VIEW COMPARISON:  None. FINDINGS: The cardio pericardial silhouette is enlarged. There is pulmonary vascular congestion without overt pulmonary edema. No focal airspace consolidation. The visualized bony structures of the thorax show no acute abnormality. IMPRESSION: Enlarged cardiopericardial silhouette with pulmonary vascular congestion. Possible interstitial edema. Electronically Signed   By: Misty Stanley M.D.   On: 07/29/2020 12:11   ECHOCARDIOGRAM COMPLETE  Result Date: 07/29/2020    ECHOCARDIOGRAM REPORT   Patient Name:   SRUTHI MAURER Date of Exam: 07/29/2020 Medical Rec #:  [id number]   Height:       67.0 in Accession #:    [id number]  Weight:       188.1 lb Date of Birth:  30-Nov-1935    BSA:          1.970 m Patient Age:    61 years    BP:           196/59 mmHg Patient Gender: F           HR:           38 bpm. Exam Location:  Inpatient  Procedure: 2D Echo Indications:    CHF-Acute Diastolic 683.41 / D62.22  History:        Patient has prior history of Echocardiogram examinations, most                 recent 01/21/2020. Risk Factors:Diabetes, Hypertension and                 Dyslipidemia. Anemic. Chronic kidney disease. Asthma.  Sonographer:    Darlina Sicilian RDCS Referring Phys: [place]  1. Left ventricular ejection fraction, by estimation, is 65 to 70%. The left ventricle has hyperdynamic function. The left ventricle has no regional wall motion abnormalities. There is mild left ventricular hypertrophy. Left ventricular diastolic parameters are indeterminate.  2. Right ventricular systolic function is normal. The right ventricular size is normal. There is severely elevated pulmonary artery systolic pressure. The estimated right ventricular systolic pressure is 97.9 mmHg.  3. Left atrial size was moderately dilated.  4. Right atrial size was mildly dilated.  5. The mitral valve is degenerative. No evidence of mitral valve regurgitation. Moderate mitral stenosis. The mean mitral valve gradient is 7.0 mmHg. Moderate mitral annular calcification.  6. Tricuspid valve regurgitation is moderate.  7. The aortic valve is tricuspid. Aortic valve regurgitation is not visualized. Mild aortic valve sclerosis is present, with no evidence of aortic valve stenosis.  8. The inferior vena cava is dilated in size with <50% respiratory variability, suggesting right atrial pressure of 15 mmHg.  9. Technically difficult study with poor acoustic windows. FINDINGS  Left Ventricle: Left ventricular ejection fraction, by estimation, is 65 to 70%. The left ventricle has hyperdynamic function. The left ventricle has no regional wall motion abnormalities. The left ventricular internal cavity size was normal in size. There is mild left ventricular hypertrophy. Left ventricular diastolic parameters are indeterminate. Right Ventricle: The right ventricular  size is normal. No increase in right ventricular wall thickness. Right ventricular systolic function is normal. There is severely elevated pulmonary artery systolic pressure. The tricuspid regurgitant velocity is 3.63 m/s, and with an assumed right atrial  pressure of 15 mmHg, the estimated right ventricular systolic pressure is 16.1 mmHg. Left Atrium: Left atrial size was moderately dilated. Right Atrium: Right atrial size was mildly dilated. Pericardium: Trivial pericardial effusion is present. Mitral Valve: The mitral valve is degenerative in appearance. There is moderate calcification of the mitral valve leaflet(s). Moderate mitral annular calcification. No evidence of mitral valve regurgitation. Moderate mitral valve stenosis. MV peak gradient, 23.0 mmHg. The mean mitral valve gradient is 7.0 mmHg. Tricuspid Valve: The tricuspid valve is normal in structure. Tricuspid valve regurgitation is moderate. Aortic Valve: The aortic valve is tricuspid. Aortic valve regurgitation is not visualized. Mild aortic valve sclerosis is present, with no evidence of aortic valve stenosis. Aortic valve mean gradient measures 6.0 mmHg. Aortic valve peak gradient measures 17.5 mmHg. Aortic valve area, by VTI measures 1.42 cm. Pulmonic Valve: The pulmonic valve was normal in structure. Pulmonic valve regurgitation is trivial. Aorta: The aortic root is normal in size and structure. Venous: The inferior vena cava is dilated in size with less than 50% respiratory variability, suggesting right atrial pressure of 15 mmHg. IAS/Shunts: No atrial level shunt detected by color flow Doppler.  LEFT VENTRICLE PLAX 2D LVIDd:         4.90 cm LVIDs:         2.60 cm LV PW:         1.00 cm LV IVS:        1.10 cm LVOT diam:     1.60 cm LV SV:         63 LV SV Index:   32 LVOT Area:     2.01 cm  RIGHT VENTRICLE RV S prime:     11.40 cm/s LEFT ATRIUM             Index       RIGHT ATRIUM           Index LA diam:        4.80 cm 2.44 cm/m  RA Area:      16.30 cm LA Vol (A2C):   72.0 ml 36.55 ml/m RA Volume:   35.20 ml  17.87 ml/m LA Vol (A4C):   79.3 ml 40.25 ml/m LA Biplane Vol: 80.1 ml 40.66 ml/m  AORTIC VALVE AV Area (Vmax):    1.45 cm AV Area (Vmean):   1.60 cm AV Area (VTI):     1.42 cm AV Vmax:           209.00 cm/s AV Vmean:          110.000 cm/s AV VTI:            0.440 m AV Peak Grad:      17.5 mmHg AV Mean Grad:      6.0 mmHg LVOT Vmax:         151.00 cm/s LVOT Vmean:        87.700 cm/s LVOT VTI:          0.311 m LVOT/AV VTI ratio: 0.71  AORTA Ao Root diam: 2.30 cm MITRAL VALVE                TRICUSPID VALVE MV Area (PHT): 2.68 cm     TR Peak grad:   52.7 mmHg MV Peak grad:  23.0 mmHg    TR Vmax:        363.00 cm/s MV Mean grad:  7.0 mmHg MV Vmax:       2.40 m/s     SHUNTS MV Vmean:  106.7 cm/s   Systemic VTI:  0.31 m MV Decel Time: 283 msec     Systemic Diam: 1.60 cm MV E velocity: 236.67 cm/s Loralie Champagne MD Electronically signed by Loralie Champagne MD Signature Date/Time: 07/29/2020/5:41:46 PM    Final     Cardiac Studies   No new  Patient Profile     84 y.o. female with permanent atrial fibrillation and symptomatic bradycardia with chronotropic incompetence. EF 65%. Plan to implant PPM Monday with Dr Caryl Comes. Questions answered. NPO Sunday night.  For questions or updates, please contact [place] Please consult [email or web address] for contact info under Cardiology/STEMI.      Signed, Lars Mage, MD  07/31/2020, 8:51 AM

## 2020-07-31 NOTE — Progress Notes (Signed)
Pt has continued with low HR, however, is asymptomatic. Denies any chest pain or discomfort. Denies any dizziness or lightheadedness. Medicated with tylenol for a h/a which was very effective. At pain follow up, patient stated "oh it really eased up a lot". Daughter visited for several hours today. Pt spoke directly to MD via phone this morning to ensure she is aware and understands plan of care to stay in hospital for the weekend and have a pacer placed Monday Nov 1. Pt verbalizes full understanding of sequence of events. Will continue to monitor this paitent.

## 2020-07-31 NOTE — Progress Notes (Signed)
PROGRESS NOTE    [name]   TGG:[id number]  DOB: May 17, 1936  DOA: 07/29/2020 PCP: [name], FNP   Brief Narrative:  [name] has a past medical history of hypertension, chronic diastolic heart failure, diabetes mellitus type 2, anemia of chronic disease, chronic kidney disease, asthma admitted for dyspnea, mainly on exertion ongoing for a few wks and pedal edema  Subjective: She has no new complaints today. Still has dyspnea on exertion    Assessment & Plan:   Principal Problem:   Shortness of breath - ? If related to severe bradycardia in addition to acute CHF with underlying severe PAH  Active Problems: A-fib, symptomatic bradycardia, complete heart block with junctional escape- - plan for pacer on Monday per cardiology  Acute on chronic diastolic CHF CKD 3-4 - on 80 mg IV lasix BID per cardiology - negative balance by 1482 cc - note Creatinine is rising    Essential hypertension - holding Losartan due to rising Cr - Hydralazine increased -cont Amlodipine    Cough variant asthma/ COPD - no wheezing noted on exam       Time spent in minutes: 35 DVT prophylaxis: apixaban (ELIQUIS) tablet 2.5 mg Start: 07/29/20 2200 SCDs Start: 07/29/20 1403 apixaban (ELIQUIS) tablet 2.5 mg  Code Status: Full code Family Communication:  Disposition Plan:  Status is: Inpatient  Remains inpatient appropriate because:diuresing for CHF, plan for pacemaker   Dispo: The patient is from: Home              Anticipated d/c is to: Home              Anticipated d/c date is: 3 days              Patient currently is not medically stable to d/c.      Consultants:   Cardiology/EP Procedures:   none Antimicrobials:  Anti-infectives (From admission, onward)   Start     Dose/Rate Route Frequency Ordered Stop   07/30/20 1715  gentamicin (GARAMYCIN) 80 mg in sodium chloride 0.9 % 500 mL irrigation        80 mg Irrigation On call 07/30/20 1620 07/31/20 1715    07/30/20 1715  ceFAZolin (ANCEF) IVPB 2g/100 mL premix        2 g 200 mL/hr over 30 Minutes Intravenous On call 07/30/20 1620 07/31/20 1715       Objective: Vitals:   07/31/20 1050 07/31/20 1401 07/31/20 1603 07/31/20 1604  BP: (!) 155/64 (!) 159/67 (!) 136/59 (!) 136/59  Pulse: (!) 42 (!) 40 (!) 40 (!) 40  Resp: 20 20 18 18   Temp: 98.1 F (36.7 C) 98.1 F (36.7 C) 98.9 F (37.2 C) 98.9 F (37.2 C)  TempSrc:  Oral Oral   SpO2:  100% 99%   Weight:      Height:        Intake/Output Summary (Last 24 hours) at 07/31/2020 1710 Last data filed at 07/31/2020 1300 Gross per 24 hour  Intake 580 ml  Output 902 ml  Net -322 ml   Filed Weights   07/29/20 1117 07/30/20 0041 07/31/20 0443  Weight: 85.3 kg 84.6 kg 82.2 kg    Examination: General exam: Appears comfortable  HEENT: PERRLA, oral mucosa moist, no sclera icterus or thrush Respiratory system: Crackles at bases. Cardiovascular system: S1 & S2 heard, RRR. bradycardia  Gastrointestinal system: Abdomen soft, non-tender, nondistended. Normal bowel sounds. Central nervous system: Alert and oriented. No focal neurological deficits. Extremities: No cyanosis,  clubbing or edema Skin: No rashes or ulcers Psychiatry:  Mood & affect appropriate.     Data Reviewed: I have personally reviewed following labs and imaging studies  CBC: Recent Labs  Lab 07/29/20 1121 07/30/20 0357 07/31/20 0604  WBC 8.4 6.6 9.3  HGB 10.6* 10.6* 10.5*  HCT 31.5* 30.7* 30.9*  MCV 82.0 79.9* 81.1  PLT 162 168 329   Basic Metabolic Panel: Recent Labs  Lab 07/29/20 1121 07/30/20 0357 07/31/20 0604  NA 142 136 138  K 4.0 4.3 4.4  CL 114* 107 106  CO2 17* 17* 21*  GLUCOSE 163* 215* 205*  BUN 27* 30* 46*  CREATININE 2.01* 2.06* 2.51*  CALCIUM 9.1 9.5 9.3   GFR: Estimated Creatinine Clearance: 19 mL/min (A) (by C-G formula based on SCr of 2.51 mg/dL (H)). Liver Function Tests: Recent Labs  Lab 07/30/20 0357  AST 25  ALT 44   ALKPHOS 74  BILITOT 1.1  PROT 6.5  ALBUMIN 3.5   No results for input(s): LIPASE, AMYLASE in the last 168 hours. No results for input(s): AMMONIA in the last 168 hours. Coagulation Profile: No results for input(s): INR, PROTIME in the last 168 hours. Cardiac Enzymes: No results for input(s): CKTOTAL, CKMB, CKMBINDEX, TROPONINI in the last 168 hours. BNP (last 3 results) No results for input(s): PROBNP in the last 8760 hours. HbA1C: Recent Labs    07/29/20 1704  HGBA1C 5.2   CBG: Recent Labs  Lab 07/30/20 1706 07/30/20 2012 07/31/20 0634 07/31/20 1157 07/31/20 1606  GLUCAP 259* 204* 205* 258* 159*   Lipid Profile: No results for input(s): CHOL, HDL, LDLCALC, TRIG, CHOLHDL, LDLDIRECT in the last 72 hours. Thyroid Function Tests: Recent Labs    07/29/20 1046  TSH 3.47  FREET4 0.99   Anemia Panel: No results for input(s): VITAMINB12, FOLATE, FERRITIN, TIBC, IRON, RETICCTPCT in the last 72 hours. Urine analysis:    Component Value Date/Time   COLORURINE YELLOW 12/19/2019 1800   APPEARANCEUR CLEAR 12/19/2019 1800   LABSPEC 1.025 12/19/2019 1800   PHURINE 5.0 12/19/2019 1800   GLUCOSEU NEGATIVE 12/19/2019 1800   GLUCOSEU NEGATIVE 06/01/2017 0908   HGBUR NEGATIVE 12/19/2019 1800   HGBUR negative 09/28/2007 0933   BILIRUBINUR SMALL (A) 12/19/2019 1800   KETONESUR 15 (A) 12/19/2019 1800   PROTEINUR 30 (A) 12/19/2019 1800   UROBILINOGEN 0.2 06/01/2017 0908   NITRITE NEGATIVE 12/19/2019 1800   LEUKOCYTESUR TRACE (A) 12/19/2019 1800   Sepsis Labs: @LABRCNTIP (procalcitonin:4,lacticidven:4) ) Recent Results (from the past 240 hour(s))  Respiratory Panel by RT PCR (Flu A&B, Covid) - Nasopharyngeal Swab     Status: None   Collection Time: 07/29/20 12:11 PM   Specimen: Nasopharyngeal Swab  Result Value Ref Range Status   SARS Coronavirus 2 by RT PCR NEGATIVE NEGATIVE Final    Comment: (NOTE) SARS-CoV-2 target nucleic acids are NOT DETECTED.  The SARS-CoV-2 RNA is  generally detectable in upper respiratoy specimens during the acute phase of infection. The lowest concentration of SARS-CoV-2 viral copies this assay can detect is 131 copies/mL. A negative result does not preclude SARS-Cov-2 infection and should not be used as the sole basis for treatment or other patient management decisions. A negative result may occur with  improper specimen collection/handling, submission of specimen other than nasopharyngeal swab, presence of viral mutation(s) within the areas targeted by this assay, and inadequate number of viral copies (<131 copies/mL). A negative result must be combined with clinical observations, patient history, and epidemiological information. The expected result is  Negative.  Fact Sheet for Patients:  PinkCheek.be  Fact Sheet for Healthcare Providers:  GravelBags.it  This test is no t yet approved or cleared by the [place] FDA and  has been authorized for detection and/or diagnosis of SARS-CoV-2 by FDA under an Emergency Use Authorization (EUA). This EUA will remain  in effect (meaning this test can be used) for the duration of the COVID-19 declaration under Section 564(b)(1) of the Act, 21 U.S.C. section 360bbb-3(b)(1), unless the authorization is terminated or revoked sooner.     Influenza A by PCR NEGATIVE NEGATIVE Final   Influenza B by PCR NEGATIVE NEGATIVE Final    Comment: (NOTE) The Xpert Xpress SARS-CoV-2/FLU/RSV assay is intended as an aid in  the diagnosis of influenza from Nasopharyngeal swab specimens and  should not be used as a sole basis for treatment. Nasal washings and  aspirates are unacceptable for Xpert Xpress SARS-CoV-2/FLU/RSV  testing.  Fact Sheet for Patients: PinkCheek.be  Fact Sheet for Healthcare Providers: GravelBags.it  This test is not yet approved or cleared by the [place] FDA and  has been authorized for detection and/or diagnosis of SARS-CoV-2 by  FDA under an Emergency Use Authorization (EUA). This EUA will remain  in effect (meaning this test can be used) for the duration of the  Covid-19 declaration under Section 564(b)(1) of the Act, 21  U.S.C. section 360bbb-3(b)(1), unless the authorization is  terminated or revoked. Performed at [facility], [place] [address], [place], [place] [address]   Surgical PCR screen     Status: None   Collection Time: 07/30/20  4:21 PM   Specimen: Nasal Mucosa; Nasal Swab  Result Value Ref Range Status   MRSA, PCR NEGATIVE NEGATIVE Final   Staphylococcus aureus NEGATIVE NEGATIVE Final    Comment: (NOTE) The Xpert SA Assay (FDA approved for NASAL specimens in patients 40 years of age and older), is one component of a comprehensive surveillance program. It is not intended to diagnose infection nor to guide or monitor treatment. Performed at [facility], [place] [address], [place], [place] [address]          Radiology Studies: No results found.    Scheduled Meds: . amLODipine  5 mg Oral Daily  . apixaban  2.5 mg Oral BID  . furosemide  80 mg Intravenous BID  . gentamicin irrigation  80 mg Irrigation On Call  . guaiFENesin  600 mg Oral BID  . hydrALAZINE  75 mg Oral TID  . insulin aspart  0-15 Units Subcutaneous TID WC  . loratadine  10 mg Oral Daily  . mometasone-formoterol  2 puff Inhalation BID  . montelukast  10 mg Oral QHS  . pantoprazole  40 mg Oral Daily  . pravastatin  40 mg Oral Daily  . sodium chloride flush  3 mL Intravenous Q12H   Continuous Infusions: . sodium chloride    . sodium chloride    . sodium chloride    .  ceFAZolin (ANCEF) IV       LOS: 2 days      Debbe Odea, MD [facility] Pager: [email or web address] 07/31/2020, 5:10 PM

## 2020-07-31 NOTE — Progress Notes (Signed)
Hospitalist notified about yellow muse score, heart rate and elevated BP. Patient to receive a pacemake on Monday. Patient awake, alert, no acute distress noted.  See orders for PRN BP med.

## 2020-08-01 DIAGNOSIS — R0602 Shortness of breath: Secondary | ICD-10-CM | POA: Diagnosis not present

## 2020-08-01 LAB — BASIC METABOLIC PANEL
Anion gap: 12 (ref 5–15)
BUN: 60 mg/dL — ABNORMAL HIGH (ref 8–23)
CO2: 21 mmol/L — ABNORMAL LOW (ref 22–32)
Calcium: 8.9 mg/dL (ref 8.9–10.3)
Chloride: 103 mmol/L (ref 98–111)
Creatinine, Ser: 2.78 mg/dL — ABNORMAL HIGH (ref 0.44–1.00)
GFR, Estimated: 16 mL/min — ABNORMAL LOW (ref >60)
Glucose, Bld: 126 mg/dL — ABNORMAL HIGH (ref 70–99)
Potassium: 3.2 mmol/L — ABNORMAL LOW (ref 3.5–5.1)
Sodium: 136 mmol/L (ref 135–145)

## 2020-08-01 LAB — GLUCOSE, CAPILLARY
Glucose-Capillary: 163 mg/dL — ABNORMAL HIGH (ref 70–99)
Glucose-Capillary: 138 mg/dL — ABNORMAL HIGH (ref 70–99)
Glucose-Capillary: 207 mg/dL — ABNORMAL HIGH (ref 70–99)
Glucose-Capillary: 168 mg/dL — ABNORMAL HIGH (ref 70–99)

## 2020-08-01 MED ORDER — ALBUTEROL SULFATE HFA 108 (90 BASE) MCG/ACT IN AERS
2.0000 | INHALATION_SPRAY | RESPIRATORY_TRACT | Status: DC | PRN
  Filled 2020-08-01: qty 6.7

## 2020-08-01 MED ORDER — MENTHOL 3 MG MT LOZG
1.0000 | LOZENGE | OROMUCOSAL | Status: DC | PRN
  Filled 2020-08-01: qty 9

## 2020-08-01 NOTE — Progress Notes (Signed)
Daughter visiting at beginning of shift.  Patient resting in bed,  Stated she spoke with doctor on phone and understands procedure, patient to have pacemaker placed on 11/1.  Patient reported a decrease in her shortness of breath, states she still has SOB but "not as bad as before".  Advised patient to call for assistance when getting up.

## 2020-08-01 NOTE — Progress Notes (Signed)
Cepacol lozenge given at this time; barcode not recognized by scanner, but order verified before administration.

## 2020-08-01 NOTE — Progress Notes (Signed)
Patient alert and oriented x 4. Lungs clear to auscultation, no dyspnea noted. There is some slight abdominal breathing, but this is intermittent; she remains on RA. HR remains in the upper 30s to low 40s at rest, and increases with activity. Bowel sounds active all quads, last BM reported 10/29 but no feelings of constipation reported at this time. Patient is continent of B&B, calling for assist as needed. Some edema to BLE, continues to be diuresed. She did complain of some throat discomfort, for which lozenges have been ordered. Blood sugars have been in mid 100s. Call light in reach, will continue to monitor. Plan for permanent pacemaker tomorrow.

## 2020-08-01 NOTE — Progress Notes (Addendum)
PROGRESS NOTE    ADA HOLNESS   LNL:[id number]  DOB: [birth year]  DOA: 07/29/2020 PCP: [name], FNP   Brief Narrative:  [name] has a past medical history of hypertension, chronic diastolic heart failure, diabetes mellitus type 2, anemia of chronic disease, chronic kidney disease, asthma admitted for dyspnea, mainly on exertion ongoing for a few wks and pedal edema  Subjective: Still quite short of breath with exertion. Asking for lozenges.    Assessment & Plan:   Principal Problem:   Shortness of breath - ? If related to severe bradycardia in addition to acute CHF with underlying severe PAH  Active Problems:  A-fib, symptomatic bradycardia, complete heart block with junctional escape- - plan for pacer on Monday per cardiology  Acute on chronic diastolic CHF CKD 3-4 - on 80 mg IV lasix BID per cardiology - negative balance by 1482 cc - note Creatinine is rising- d/w Dr Marlou Porch  - he has recommended stopping Lasix for now    Essential hypertension - holding Losartan due to rising Cr - Hydralazine increased - cont Amlodipine    Cough variant asthma/ COPD - no wheezing noted on exam       Time spent in minutes: 35 DVT prophylaxis: apixaban (ELIQUIS) tablet 2.5 mg Start: 07/29/20 2200 SCDs Start: 07/29/20 1403 apixaban (ELIQUIS) tablet 2.5 mg  Code Status: Full code Family Communication:  Disposition Plan:  Status is: Inpatient  Remains inpatient appropriate because: , plan for pacemaker   Dispo: The patient is from: Home              Anticipated d/c is to: Home              Anticipated d/c date is: 3 days              Patient currently is not medically stable to d/c.  Consultants:   Cardiology/EP Procedures:   none Antimicrobials:  Anti-infectives (From admission, onward)   Start     Dose/Rate Route Frequency Ordered Stop   07/30/20 1715  gentamicin (GARAMYCIN) 80 mg in sodium chloride 0.9 % 500 mL irrigation        80 mg Irrigation  On call 07/30/20 1620 07/31/20 1715   07/30/20 1715  ceFAZolin (ANCEF) IVPB 2g/100 mL premix        2 g 200 mL/hr over 30 Minutes Intravenous On call 07/30/20 1620 07/31/20 1715       Objective: Vitals:   08/01/20 0300 08/01/20 0343 08/01/20 0737 08/01/20 1102  BP:  (!) 185/59  (!) 145/67  Pulse:  (!) 40  (!) 41  Resp:  18  20  Temp:  99 F (37.2 C)  98.7 F (37.1 C)  TempSrc:  Oral  Oral  SpO2:  99% 96% 100%  Weight: 82 kg     Height:        Intake/Output Summary (Last 24 hours) at 08/01/2020 1151 Last data filed at 08/01/2020 1105 Gross per 24 hour  Intake 940 ml  Output 3600 ml  Net -2660 ml   Filed Weights   07/30/20 0041 07/31/20 0443 08/01/20 0300  Weight: 84.6 kg 82.2 kg 82 kg    Examination: General exam: Appears comfortable  HEENT: PERRLA, oral mucosa moist, no sclera icterus or thrush Respiratory system: Clear to auscultation. Respiratory effort normal. Cardiovascular system: S1 & S2 heard,  No murmurs -bradycardic Gastrointestinal system: Abdomen soft, non-tender, nondistended. Normal bowel sounds   Central nervous system: Alert and oriented. No focal  neurological deficits. Extremities: No cyanosis, clubbing or edema Skin: No rashes or ulcers Psychiatry:  Mood & affect appropriate.     Data Reviewed: I have personally reviewed following labs and imaging studies  CBC: Recent Labs  Lab 07/29/20 1121 07/30/20 0357 07/31/20 0604  WBC 8.4 6.6 9.3  HGB 10.6* 10.6* 10.5*  HCT 31.5* 30.7* 30.9*  MCV 82.0 79.9* 81.1  PLT 162 168 858   Basic Metabolic Panel: Recent Labs  Lab 07/29/20 1121 07/30/20 0357 07/31/20 0604 08/01/20 0946  NA 142 136 138 136  K 4.0 4.3 4.4 3.2*  CL 114* 107 106 103  CO2 17* 17* 21* 21*  GLUCOSE 163* 215* 205* 126*  BUN 27* 30* 46* 60*  CREATININE 2.01* 2.06* 2.51* 2.78*  CALCIUM 9.1 9.5 9.3 8.9   GFR: Estimated Creatinine Clearance: 17.2 mL/min (A) (by C-G formula based on SCr of 2.78 mg/dL (H)). Liver Function  Tests: Recent Labs  Lab 07/30/20 0357  AST 25  ALT 44  ALKPHOS 74  BILITOT 1.1  PROT 6.5  ALBUMIN 3.5   No results for input(s): LIPASE, AMYLASE in the last 168 hours. No results for input(s): AMMONIA in the last 168 hours. Coagulation Profile: No results for input(s): INR, PROTIME in the last 168 hours. Cardiac Enzymes: No results for input(s): CKTOTAL, CKMB, CKMBINDEX, TROPONINI in the last 168 hours. BNP (last 3 results) No results for input(s): PROBNP in the last 8760 hours. HbA1C: Recent Labs    07/29/20 1704  HGBA1C 5.2   CBG: Recent Labs  Lab 07/31/20 1157 07/31/20 1606 07/31/20 2103 08/01/20 0626 08/01/20 1110  GLUCAP 258* 159* 249* 163* 138*   Lipid Profile: No results for input(s): CHOL, HDL, LDLCALC, TRIG, CHOLHDL, LDLDIRECT in the last 72 hours. Thyroid Function Tests: No results for input(s): TSH, T4TOTAL, FREET4, T3FREE, THYROIDAB in the last 72 hours. Anemia Panel: No results for input(s): VITAMINB12, FOLATE, FERRITIN, TIBC, IRON, RETICCTPCT in the last 72 hours. Urine analysis:    Component Value Date/Time   COLORURINE YELLOW 12/19/2019 1800   APPEARANCEUR CLEAR 12/19/2019 1800   LABSPEC 1.025 12/19/2019 1800   PHURINE 5.0 12/19/2019 1800   GLUCOSEU NEGATIVE 12/19/2019 1800   GLUCOSEU NEGATIVE 06/01/2017 0908   HGBUR NEGATIVE 12/19/2019 1800   HGBUR negative 09/28/2007 0933   BILIRUBINUR SMALL (A) 12/19/2019 1800   KETONESUR 15 (A) 12/19/2019 1800   PROTEINUR 30 (A) 12/19/2019 1800   UROBILINOGEN 0.2 06/01/2017 0908   NITRITE NEGATIVE 12/19/2019 1800   LEUKOCYTESUR TRACE (A) 12/19/2019 1800   Sepsis Labs: @LABRCNTIP (procalcitonin:4,lacticidven:4) ) Recent Results (from the past 240 hour(s))  Respiratory Panel by RT PCR (Flu A&B, Covid) - Nasopharyngeal Swab     Status: None   Collection Time: 07/29/20 12:11 PM   Specimen: Nasopharyngeal Swab  Result Value Ref Range Status   SARS Coronavirus 2 by RT PCR NEGATIVE NEGATIVE Final     Comment: (NOTE) SARS-CoV-2 target nucleic acids are NOT DETECTED.  The SARS-CoV-2 RNA is generally detectable in upper respiratoy specimens during the acute phase of infection. The lowest concentration of SARS-CoV-2 viral copies this assay can detect is 131 copies/mL. A negative result does not preclude SARS-Cov-2 infection and should not be used as the sole basis for treatment or other patient management decisions. A negative result may occur with  improper specimen collection/handling, submission of specimen other than nasopharyngeal swab, presence of viral mutation(s) within the areas targeted by this assay, and inadequate number of viral copies (<131 copies/mL). A negative result must  be combined with clinical observations, patient history, and epidemiological information. The expected result is Negative.  Fact Sheet for Patients:  PinkCheek.be  Fact Sheet for Healthcare Providers:  GravelBags.it  This test is no t yet approved or cleared by the [place] FDA and  has been authorized for detection and/or diagnosis of SARS-CoV-2 by FDA under an Emergency Use Authorization (EUA). This EUA will remain  in effect (meaning this test can be used) for the duration of the COVID-19 declaration under Section 564(b)(1) of the Act, 21 U.S.C. section 360bbb-3(b)(1), unless the authorization is terminated or revoked sooner.     Influenza A by PCR NEGATIVE NEGATIVE Final   Influenza B by PCR NEGATIVE NEGATIVE Final    Comment: (NOTE) The Xpert Xpress SARS-CoV-2/FLU/RSV assay is intended as an aid in  the diagnosis of influenza from Nasopharyngeal swab specimens and  should not be used as a sole basis for treatment. Nasal washings and  aspirates are unacceptable for Xpert Xpress SARS-CoV-2/FLU/RSV  testing.  Fact Sheet for Patients: PinkCheek.be  Fact Sheet for Healthcare  Providers: GravelBags.it  This test is not yet approved or cleared by the [place] FDA and  has been authorized for detection and/or diagnosis of SARS-CoV-2 by  FDA under an Emergency Use Authorization (EUA). This EUA will remain  in effect (meaning this test can be used) for the duration of the  Covid-19 declaration under Section 564(b)(1) of the Act, 21  U.S.C. section 360bbb-3(b)(1), unless the authorization is  terminated or revoked. Performed at [facility], [place] [address] [place], [place] [address]   Surgical PCR screen     Status: None   Collection Time: 07/30/20  4:21 PM   Specimen: Nasal Mucosa; Nasal Swab  Result Value Ref Range Status   MRSA, PCR NEGATIVE NEGATIVE Final   Staphylococcus aureus NEGATIVE NEGATIVE Final    Comment: (NOTE) The Xpert SA Assay (FDA approved for NASAL specimens in patients 16 years of age and older), is one component of a comprehensive surveillance program. It is not intended to diagnose infection nor to guide or monitor treatment. Performed at [facility], [place] [address], [place], [place] [address]          Radiology Studies: No results found.    Scheduled Meds: . amLODipine  5 mg Oral Daily  . apixaban  2.5 mg Oral BID  . guaiFENesin  600 mg Oral BID  . hydrALAZINE  75 mg Oral TID  . insulin aspart  0-15 Units Subcutaneous TID WC  . loratadine  10 mg Oral Daily  . mometasone-formoterol  2 puff Inhalation BID  . montelukast  10 mg Oral QHS  . pantoprazole  40 mg Oral Daily  . pravastatin  40 mg Oral Daily  . sodium chloride flush  3 mL Intravenous Q12H   Continuous Infusions: . sodium chloride    . sodium chloride    . sodium chloride       LOS: 3 days      Debbe Odea, MD [facility] Pager: [email or web address] 08/01/2020, 11:51 AM

## 2020-08-01 NOTE — Progress Notes (Signed)
Progress Note  Patient Name: [name] Date of Encounter: 08/01/2020  [facility] Cardiologist: Dorris Carnes, MD   Subjective   No syncope, no shortness of breath.  No chest pain  Inpatient Medications    Scheduled Meds: . amLODipine  5 mg Oral Daily  . apixaban  2.5 mg Oral BID  . furosemide  80 mg Intravenous BID  . guaiFENesin  600 mg Oral BID  . hydrALAZINE  75 mg Oral TID  . insulin aspart  0-15 Units Subcutaneous TID WC  . loratadine  10 mg Oral Daily  . mometasone-formoterol  2 puff Inhalation BID  . montelukast  10 mg Oral QHS  . pantoprazole  40 mg Oral Daily  . pravastatin  40 mg Oral Daily  . sodium chloride flush  3 mL Intravenous Q12H   Continuous Infusions: . sodium chloride    . sodium chloride    . sodium chloride     PRN Meds: sodium chloride, acetaminophen **OR** acetaminophen, albuterol, ipratropium-albuterol, oxyCODONE, sodium chloride flush   Vital Signs    Vitals:   07/31/20 1948 08/01/20 0300 08/01/20 0343 08/01/20 0737  BP: (!) 159/54  (!) 185/59   Pulse: (!) 39  (!) 40   Resp: 16  18   Temp: 98 F (36.7 C)  99 F (37.2 C)   TempSrc: Oral  Oral   SpO2: 100%  99% 96%  Weight:  82 kg    Height:        Intake/Output Summary (Last 24 hours) at 08/01/2020 1018 Last data filed at 08/01/2020 0949 Gross per 24 hour  Intake 1180 ml  Output 3500 ml  Net -2320 ml   Last 3 Weights 08/01/2020 07/31/2020 07/30/2020  Weight (lbs) 180 lb 12.4 oz 181 lb 4.8 oz 186 lb 8.2 oz  Weight (kg) 82 kg 82.237 kg 84.6 kg      Telemetry    Atrial fibrillation heart rate in the 30s- Personally Reviewed  ECG    No new- Personally Reviewed  Physical Exam   GEN: No acute distress.   Neck: No JVD Cardiac:  Bradycardic fairly regular, no murmurs, rubs, or gallops.  Respiratory: Clear to auscultation bilaterally. GI: Soft, nontender, non-distended  MS: No edema; No deformity. Neuro:  Nonfocal  Psych: Normal affect   Labs    High  Sensitivity Troponin:   Recent Labs  Lab 07/29/20 1121 07/29/20 1704  TROPONINIHS 8 11      Chemistry Recent Labs  Lab 07/29/20 1121 07/30/20 0357 07/31/20 0604  NA 142 136 138  K 4.0 4.3 4.4  CL 114* 107 106  CO2 17* 17* 21*  GLUCOSE 163* 215* 205*  BUN 27* 30* 46*  CREATININE 2.01* 2.06* 2.51*  CALCIUM 9.1 9.5 9.3  PROT  --  6.5  --   ALBUMIN  --  3.5  --   AST  --  25  --   ALT  --  44  --   ALKPHOS  --  74  --   BILITOT  --  1.1  --   GFRNONAA 24* 24* 19*  ANIONGAP 11 12 11      Hematology Recent Labs  Lab 07/29/20 1121 07/30/20 0357 07/31/20 0604  WBC 8.4 6.6 9.3  RBC 3.84* 3.84* 3.81*  HGB 10.6* 10.6* 10.5*  HCT 31.5* 30.7* 30.9*  MCV 82.0 79.9* 81.1  MCH 27.6 27.6 27.6  MCHC 33.7 34.5 34.0  RDW 15.9* 15.4 15.2  PLT 162 168 165  BNP Recent Labs  Lab 07/29/20 1121  BNP 371.9*     DDimer No results for input(s): DDIMER in the last 168 hours.   Radiology    No results found.  Cardiac Studies   Echo 07/29/2020 1. Left ventricular ejection fraction, by estimation, is 65 to 70%. The  left ventricle has hyperdynamic function. The left ventricle has no  regional wall motion abnormalities. There is mild left ventricular  hypertrophy. Left ventricular diastolic  parameters are indeterminate.  2. Right ventricular systolic function is normal. The right ventricular  size is normal. There is severely elevated pulmonary artery systolic  pressure. The estimated right ventricular systolic pressure is 77.4 mmHg.  3. Left atrial size was moderately dilated.  4. Right atrial size was mildly dilated.  5. The mitral valve is degenerative. No evidence of mitral valve  regurgitation. Moderate mitral stenosis. The mean mitral valve gradient is  7.0 mmHg. Moderate mitral annular calcification.  6. Tricuspid valve regurgitation is moderate.  7. The aortic valve is tricuspid. Aortic valve regurgitation is not  visualized. Mild aortic valve sclerosis  is present, with no evidence of  aortic valve stenosis.  8. The inferior vena cava is dilated in size with <50% respiratory  variability, suggesting right atrial pressure of 15 mmHg.  9. Technically difficult study with poor acoustic windows.   Patient Profile     84 y.o. female with symptomatic bradycardia atrial fibrillation heart rate in the upper 30s with chronotropic incompetence  Assessment & Plan    -Awaiting pacemaker Monday.  Dr. Olin Pia note reviewed.  Dr. Mardene Speak note reviewed. -Heart rate remains at times at rest in the upper 30s.  Asymptomatic when laying in bed.  Perfusing well.  Acute diastolic heart failure with secondary pulmonary hypertension -Blood pressure is quite elevated on admission.  Diuresing well.  She is out 3.5 L.  Creatinine yesterday did increase from 2.06-2.5.  Awaiting labs this morning.  If creatinine is increased once again today, we will hold further diuresis. -Secondary pulmonary hypertension is quite dependent upon systemic blood pressure.  Essential hypertension -Difficult to control -Losartan has been on hold because of rising creatinine -Hydralazine has been increased on this admission, amlodipine is continued. -Hope was that as diuresis continued, her blood pressure would follow.      For questions or updates, please contact [place] Please consult [email or web address] for contact info under        Signed, Candee Furbish, MD  08/01/2020, 10:18 AM

## 2020-08-02 ENCOUNTER — Encounter (HOSPITAL_COMMUNITY)
Admission: EM | Disposition: A | Discharge status: Home Health | Payer: Self-pay | Source: Home / Self Care | Attending: Internal Medicine

## 2020-08-02 DIAGNOSIS — I10 Essential (primary) hypertension: Secondary | ICD-10-CM

## 2020-08-02 DIAGNOSIS — I5031 Acute diastolic (congestive) heart failure: Secondary | ICD-10-CM

## 2020-08-02 DIAGNOSIS — I442 Atrioventricular block, complete: Secondary | ICD-10-CM | POA: Diagnosis not present

## 2020-08-02 HISTORY — PX: PACEMAKER IMPLANT: EP1218

## 2020-08-02 LAB — GLUCOSE, CAPILLARY
Glucose-Capillary: 166 mg/dL — ABNORMAL HIGH (ref 70–99)
Glucose-Capillary: 178 mg/dL — ABNORMAL HIGH (ref 70–99)
Glucose-Capillary: 149 mg/dL — ABNORMAL HIGH (ref 70–99)
Glucose-Capillary: 178 mg/dL — ABNORMAL HIGH (ref 70–99)

## 2020-08-02 LAB — CBC WITH DIFFERENTIAL/PLATELET
Abs Immature Granulocytes: 0.05 10*3/uL (ref 0.00–0.07)
Basophils Absolute: 0 10*3/uL (ref 0.0–0.1)
Basophils Relative: 0 %
Eosinophils Absolute: 0.1 10*3/uL (ref 0.0–0.5)
Eosinophils Relative: 1 %
HCT: 35 % — ABNORMAL LOW (ref 36.0–46.0)
Hemoglobin: 12.1 g/dL (ref 12.0–15.0)
Immature Granulocytes: 1 %
Lymphocytes Relative: 11 %
Lymphs Abs: 0.9 10*3/uL (ref 0.7–4.0)
MCH: 27.4 pg (ref 26.0–34.0)
MCHC: 34.6 g/dL (ref 30.0–36.0)
MCV: 79.2 fL — ABNORMAL LOW (ref 80.0–100.0)
Monocytes Absolute: 0.9 10*3/uL (ref 0.1–1.0)
Monocytes Relative: 11 %
Neutro Abs: 6 10*3/uL (ref 1.7–7.7)
Neutrophils Relative %: 76 %
Platelets: 202 10*3/uL (ref 150–400)
RBC: 4.42 MIL/uL (ref 3.87–5.11)
RDW: 14.8 % (ref 11.5–15.5)
WBC: 7.8 10*3/uL (ref 4.0–10.5)
nRBC: 0 % (ref 0.0–0.2)

## 2020-08-02 LAB — BASIC METABOLIC PANEL
Anion gap: 14 (ref 5–15)
BUN: 62 mg/dL — ABNORMAL HIGH (ref 8–23)
CO2: 19 mmol/L — ABNORMAL LOW (ref 22–32)
Calcium: 8.7 mg/dL — ABNORMAL LOW (ref 8.9–10.3)
Chloride: 106 mmol/L (ref 98–111)
Creatinine, Ser: 2.91 mg/dL — ABNORMAL HIGH (ref 0.44–1.00)
GFR, Estimated: 16 mL/min — ABNORMAL LOW (ref >60)
Glucose, Bld: 150 mg/dL — ABNORMAL HIGH (ref 70–99)
Potassium: 3.1 mmol/L — ABNORMAL LOW (ref 3.5–5.1)
Sodium: 139 mmol/L (ref 135–145)

## 2020-08-02 LAB — FRUCTOSAMINE: Fructosamine: 262 umol/L (ref 205–285)

## 2020-08-02 LAB — MAGNESIUM
Magnesium: 1.9 mg/dL (ref 1.7–2.4)
Magnesium: 2.1 mg/dL (ref 1.7–2.4)

## 2020-08-02 SURGERY — PACEMAKER IMPLANT

## 2020-08-02 MED ORDER — HEPARIN (PORCINE) IN NACL 1000-0.9 UT/500ML-% IV SOLN
INTRAVENOUS | Status: DC | PRN
  Administered 2020-08-02: 500 mL

## 2020-08-02 MED ORDER — SODIUM CHLORIDE 0.9 % IV SOLN
INTRAVENOUS | Status: AC
  Filled 2020-08-02: qty 2

## 2020-08-02 MED ORDER — MIDAZOLAM HCL 5 MG/5ML IJ SOLN
INTRAMUSCULAR | Status: AC
  Filled 2020-08-02: qty 5

## 2020-08-02 MED ORDER — SENNOSIDES-DOCUSATE SODIUM 8.6-50 MG PO TABS
1.0000 | ORAL_TABLET | Freq: Once | ORAL | Status: AC
  Administered 2020-08-02: 1 via ORAL
  Filled 2020-08-02: qty 1

## 2020-08-02 MED ORDER — FENTANYL CITRATE (PF) 100 MCG/2ML IJ SOLN
INTRAMUSCULAR | Status: AC
  Filled 2020-08-02: qty 2

## 2020-08-02 MED ORDER — SODIUM CHLORIDE 0.9 % IV SOLN
80.0000 mg | INTRAVENOUS | Status: AC
  Administered 2020-08-02: 80 mg

## 2020-08-02 MED ORDER — POTASSIUM CHLORIDE 10 MEQ/100ML IV SOLN
10.0000 meq | Freq: Once | INTRAVENOUS | Status: AC
  Administered 2020-08-02: 10 meq via INTRAVENOUS
  Filled 2020-08-02: qty 100

## 2020-08-02 MED ORDER — LIDOCAINE HCL (PF) 1 % IJ SOLN
INTRAMUSCULAR | Status: DC | PRN
  Administered 2020-08-02: 60 mL

## 2020-08-02 MED ORDER — MIDAZOLAM HCL 5 MG/5ML IJ SOLN
INTRAMUSCULAR | Status: DC | PRN
  Administered 2020-08-02 (×3): 1 mg via INTRAVENOUS

## 2020-08-02 MED ORDER — HEPARIN (PORCINE) IN NACL 1000-0.9 UT/500ML-% IV SOLN
INTRAVENOUS | Status: AC
  Filled 2020-08-02: qty 500

## 2020-08-02 MED ORDER — CEFAZOLIN SODIUM-DEXTROSE 2-4 GM/100ML-% IV SOLN
2.0000 g | INTRAVENOUS | Status: AC
  Administered 2020-08-02: 2 g via INTRAVENOUS
  Filled 2020-08-02 (×2): qty 100

## 2020-08-02 MED ORDER — POTASSIUM CHLORIDE CRYS ER 20 MEQ PO TBCR
40.0000 meq | EXTENDED_RELEASE_TABLET | ORAL | Status: AC
  Administered 2020-08-02: 40 meq via ORAL
  Filled 2020-08-02 (×2): qty 2

## 2020-08-02 MED ORDER — ACETAMINOPHEN 325 MG PO TABS
325.0000 mg | ORAL_TABLET | ORAL | Status: DC | PRN
  Administered 2020-08-02: 650 mg via ORAL
  Filled 2020-08-02: qty 2

## 2020-08-02 MED ORDER — ONDANSETRON HCL 4 MG/2ML IJ SOLN
4.0000 mg | Freq: Four times a day (QID) | INTRAMUSCULAR | Status: DC | PRN
  Administered 2020-08-03 (×2): 4 mg via INTRAVENOUS
  Filled 2020-08-02 (×2): qty 2

## 2020-08-02 MED ORDER — CHLORHEXIDINE GLUCONATE 4 % EX LIQD
60.0000 mL | Freq: Once | CUTANEOUS | Status: AC
  Administered 2020-08-02: 4 via TOPICAL

## 2020-08-02 MED ORDER — AMLODIPINE BESYLATE 5 MG PO TABS
5.0000 mg | ORAL_TABLET | Freq: Two times a day (BID) | ORAL | Status: DC
  Administered 2020-08-02 – 2020-08-03 (×2): 5 mg via ORAL
  Filled 2020-08-02 (×2): qty 1

## 2020-08-02 MED ORDER — CHLORHEXIDINE GLUCONATE 4 % EX LIQD
60.0000 mL | Freq: Once | CUTANEOUS | Status: DC
  Filled 2020-08-02: qty 60

## 2020-08-02 MED ORDER — CEFAZOLIN SODIUM-DEXTROSE 1-4 GM/50ML-% IV SOLN
1.0000 g | Freq: Four times a day (QID) | INTRAVENOUS | Status: AC
  Administered 2020-08-02 – 2020-08-03 (×3): 1 g via INTRAVENOUS
  Filled 2020-08-02 (×4): qty 50

## 2020-08-02 MED ORDER — CEFAZOLIN SODIUM-DEXTROSE 2-4 GM/100ML-% IV SOLN
INTRAVENOUS | Status: AC
  Filled 2020-08-02: qty 100

## 2020-08-02 MED ORDER — FENTANYL CITRATE (PF) 100 MCG/2ML IJ SOLN
INTRAMUSCULAR | Status: DC | PRN
  Administered 2020-08-02: 12.5 ug via INTRAVENOUS
  Administered 2020-08-02: 25 ug via INTRAVENOUS

## 2020-08-02 MED ORDER — LIDOCAINE HCL (PF) 1 % IJ SOLN
INTRAMUSCULAR | Status: AC
  Filled 2020-08-02: qty 60

## 2020-08-02 SURGICAL SUPPLY — 14 items
CABLE SURGICAL S-101-97-12 (CABLE) ×2 IMPLANT
CATH RIGHTSITE C315HIS02 (CATHETERS) ×1 IMPLANT
COVER DOME SNAP 22 D (MISCELLANEOUS) ×1 IMPLANT
HEMOSTAT SURGICEL 2X4 FIBR (HEMOSTASIS) ×1 IMPLANT
IPG PACE AZUR XT SR MRI W1SR01 (Pacemaker) IMPLANT
LEAD SELECT SECURE 3830 383069 (Lead) IMPLANT
MAT PREVALON FULL STRYKER (MISCELLANEOUS) ×1 IMPLANT
PACE AZURE XT SR MRI W1SR01 (Pacemaker) ×2 IMPLANT
PAD PRO RADIOLUCENT 2001M-C (PAD) ×2 IMPLANT
SELECT SECURE 3830 383069 (Lead) ×2 IMPLANT
SHEATH 7FR PRELUDE SNAP 13 (SHEATH) ×1 IMPLANT
SLITTER 6232ADJ (MISCELLANEOUS) ×1 IMPLANT
TRAY PACEMAKER INSERTION (PACKS) ×2 IMPLANT
WIRE HI TORQ VERSACORE J 260CM (WIRE) ×1 IMPLANT

## 2020-08-02 NOTE — Progress Notes (Addendum)
Electrophysiology Rounding Note  Patient Name: [name] Date of Encounter: 08/02/2020  Primary Cardiologist: Dorris Carnes, MD Electrophysiologist: New this admit to Dr. Caryl Comes   Subjective   The patient is awake and alert without complaint this am. She feels all of her questions re: pacing have been answered.   Inpatient Medications    Scheduled Meds: . amLODipine  5 mg Oral Daily  . guaiFENesin  600 mg Oral BID  . hydrALAZINE  75 mg Oral TID  . insulin aspart  0-15 Units Subcutaneous TID WC  . loratadine  10 mg Oral Daily  . mometasone-formoterol  2 puff Inhalation BID  . montelukast  10 mg Oral QHS  . pantoprazole  40 mg Oral Daily  . pravastatin  40 mg Oral Daily  . sodium chloride flush  3 mL Intravenous Q12H   Continuous Infusions: . sodium chloride    . sodium chloride    . sodium chloride     PRN Meds: sodium chloride, acetaminophen **OR** acetaminophen, albuterol, ipratropium-albuterol, menthol-cetylpyridinium, oxyCODONE, sodium chloride flush   Vital Signs    Vitals:   08/01/20 1102 08/01/20 2002 08/02/20 0100 08/02/20 0405  BP: (!) 145/67 (!) 152/60  (!) 165/54  Pulse: (!) 41 (!) 44  (!) 39  Resp: 20 16  16   Temp: 98.7 F (37.1 C) 98.6 F (37 C)  98 F (36.7 C)  TempSrc: Oral   Oral  SpO2: 100% 97%  98%  Weight:   80.6 kg   Height:        Intake/Output Summary (Last 24 hours) at 08/02/2020 0814 Last data filed at 08/02/2020 0500 Gross per 24 hour  Intake 120 ml  Output 3075 ml  Net -2955 ml   Filed Weights   07/31/20 0443 08/01/20 0300 08/02/20 0100  Weight: 82.2 kg 82 kg 80.6 kg    Physical Exam    GEN- The patient is well appearing, alert and oriented x 3 today.   Head- normocephalic, atraumatic Eyes-  Sclera clear, conjunctiva pink Ears- hearing intact Oropharynx- clear Neck- supple Lungs- Clear to ausculation bilaterally, normal work of breathing Heart- Regular rate and rhythm, no murmurs, rubs or gallops GI- soft, NT, ND, +  BS Extremities- no clubbing or cyanosis. No edema Skin- no rash or lesion Psych- euthymic mood, full affect Neuro- strength and sensation are intact  Labs    CBC Recent Labs    07/31/20 0604  WBC 9.3  HGB 10.5*  HCT 30.9*  MCV 81.1  PLT 401   Basic Metabolic Panel Recent Labs    07/31/20 0604 08/01/20 0946  NA 138 136  K 4.4 3.2*  CL 106 103  CO2 21* 21*  GLUCOSE 205* 126*  BUN 46* 60*  CREATININE 2.51* 2.78*  CALCIUM 9.3 8.9   Liver Function Tests No results for input(s): AST, ALT, ALKPHOS, BILITOT, PROT, ALBUMIN in the last 72 hours. No results for input(s): LIPASE, AMYLASE in the last 72 hours. Cardiac Enzymes No results for input(s): CKTOTAL, CKMB, CKMBINDEX, TROPONINI in the last 72 hours.   Telemetry    AF with low ventricular rate in 30-40s (personally reviewed)  Radiology    No results found.  Patient Profile     84 y.o. female with symptomatic bradycardia atrial fibrillation heart rate in the upper 30s with chronotropic incompetence  Assessment & Plan    1. CHB with junctional escape / Bradycardia with out chronotropic response She remains in AF with HRs in the 30s this am.  Explained risks, benefits, and alternatives to PPM implantation, including but not limited to bleeding, infection, pneumothorax, pericardial effusion, lead dislodgement, heart attack, stroke, or death.  Pt verbalized understanding and agrees to proceed. On scheduled for today.   2. Permanent Atrial fibrillation She is on Eliquis 2.5 chronically due to age and renal function. This was given last night.   3. HTN Stable.  4. Hypokalemia K 3.2 yesterday. I do not see that she was supped. Stat pending today.   For questions or updates, please contact [place] Please consult [email or web address] for contact info under Cardiology/STEMI.  Signed, Shirley Friar, PA-C  08/02/2020, 8:14 AM    For pacemaker implant No Apixoban  This am

## 2020-08-02 NOTE — Progress Notes (Signed)
PT Cancellation Note  Patient Details Name: [name] MRN: [id number] DOB: 05-17-1936   Cancelled Treatment:    Reason Eval/Treat Not Completed: Patient at procedure or test/unavailable (leaving for PPM).  Wyona Almas, PT, DPT Acute Rehabilitation Services Pager [phone] Office [phone]     [name] 08/02/2020, 2:08 PM

## 2020-08-02 NOTE — Progress Notes (Signed)
Progress Note  Patient Name: [name] Date of Encounter: 08/02/2020  [facility] Cardiologist: Dorris Carnes, MD   Subjective   Denies any chest pain, dyspnea, or lightheadedness  Inpatient Medications    Scheduled Meds: . amLODipine  5 mg Oral Daily  . chlorhexidine  60 mL Topical Once  . gentamicin irrigation  80 mg Irrigation On Call  . guaiFENesin  600 mg Oral BID  . hydrALAZINE  75 mg Oral TID  . insulin aspart  0-15 Units Subcutaneous TID WC  . loratadine  10 mg Oral Daily  . mometasone-formoterol  2 puff Inhalation BID  . montelukast  10 mg Oral QHS  . pantoprazole  40 mg Oral Daily  . potassium chloride  40 mEq Oral Q4H  . pravastatin  40 mg Oral Daily  . sodium chloride flush  3 mL Intravenous Q12H   Continuous Infusions: . sodium chloride    . sodium chloride    . sodium chloride    .  ceFAZolin (ANCEF) IV    . potassium chloride     PRN Meds: sodium chloride, acetaminophen **OR** acetaminophen, albuterol, ipratropium-albuterol, menthol-cetylpyridinium, oxyCODONE, sodium chloride flush   Vital Signs    Vitals:   08/01/20 1102 08/01/20 2002 08/02/20 0100 08/02/20 0405  BP: (!) 145/67 (!) 152/60  (!) 165/54  Pulse: (!) 41 (!) 44  (!) 39  Resp: 20 16  16   Temp: 98.7 F (37.1 C) 98.6 F (37 C)  98 F (36.7 C)  TempSrc: Oral   Oral  SpO2: 100% 97%  98%  Weight:   80.6 kg   Height:        Intake/Output Summary (Last 24 hours) at 08/02/2020 0949 Last data filed at 08/02/2020 0842 Gross per 24 hour  Intake 0 ml  Output 3075 ml  Net -3075 ml   Last 3 Weights 08/02/2020 08/01/2020 07/31/2020  Weight (lbs) 177 lb 11.1 oz 180 lb 12.4 oz 181 lb 4.8 oz  Weight (kg) 80.6 kg 82 kg 82.237 kg      Telemetry    Atrial fibrillation heart rate in the 30s- Personally Reviewed  ECG    No new- Personally Reviewed  Physical Exam   GEN: No acute distress.   Neck: No JVD Cardiac:  Bradycardic, regular, no murmurs, rubs, or gallops.  Respiratory: Clear  to auscultation bilaterally. GI: Soft, nontender, non-distended  MS: No edema; No deformity. Neuro:  Nonfocal  Psych: Normal affect   Labs    High Sensitivity Troponin:   Recent Labs  Lab 07/29/20 1121 07/29/20 1704  TROPONINIHS 8 11      Chemistry Recent Labs  Lab 07/30/20 0357 07/30/20 0357 07/31/20 0604 08/01/20 0946 08/02/20 0755  NA 136   < > 138 136 139  K 4.3   < > 4.4 3.2* 3.1*  CL 107   < > 106 103 106  CO2 17*   < > 21* 21* 19*  GLUCOSE 215*   < > 205* 126* 150*  BUN 30*   < > 46* 60* 62*  CREATININE 2.06*   < > 2.51* 2.78* 2.91*  CALCIUM 9.5   < > 9.3 8.9 8.7*  PROT 6.5  --   --   --   --   ALBUMIN 3.5  --   --   --   --   AST 25  --   --   --   --   ALT 44  --   --   --   --  ALKPHOS 74  --   --   --   --   BILITOT 1.1  --   --   --   --   GFRNONAA 24*   < > 19* 16* 16*  ANIONGAP 12   < > 11 12 14    < > = values in this interval not displayed.     Hematology Recent Labs  Lab 07/30/20 0357 07/31/20 0604 08/02/20 0755  WBC 6.6 9.3 7.8  RBC 3.84* 3.81* 4.42  HGB 10.6* 10.5* 12.1  HCT 30.7* 30.9* 35.0*  MCV 79.9* 81.1 79.2*  MCH 27.6 27.6 27.4  MCHC 34.5 34.0 34.6  RDW 15.4 15.2 14.8  PLT 168 165 202    BNP Recent Labs  Lab 07/29/20 1121  BNP 371.9*     DDimer No results for input(s): DDIMER in the last 168 hours.   Radiology    No results found.  Cardiac Studies   Echo 07/29/2020 1. Left ventricular ejection fraction, by estimation, is 65 to 70%. The  left ventricle has hyperdynamic function. The left ventricle has no  regional wall motion abnormalities. There is mild left ventricular  hypertrophy. Left ventricular diastolic  parameters are indeterminate.  2. Right ventricular systolic function is normal. The right ventricular  size is normal. There is severely elevated pulmonary artery systolic  pressure. The estimated right ventricular systolic pressure is 46.5 mmHg.  3. Left atrial size was moderately dilated.  4.  Right atrial size was mildly dilated.  5. The mitral valve is degenerative. No evidence of mitral valve  regurgitation. Moderate mitral stenosis. The mean mitral valve gradient is  7.0 mmHg. Moderate mitral annular calcification.  6. Tricuspid valve regurgitation is moderate.  7. The aortic valve is tricuspid. Aortic valve regurgitation is not  visualized. Mild aortic valve sclerosis is present, with no evidence of  aortic valve stenosis.  8. The inferior vena cava is dilated in size with <50% respiratory  variability, suggesting right atrial pressure of 15 mmHg.  9. Technically difficult study with poor acoustic windows.   Patient Profile     84 y.o. female with symptomatic bradycardia atrial fibrillation heart rate in the upper 30s with chronotropic incompetence  Assessment & Plan    CHB with junctional escape rhythm -Pacemaker planned today with Dr Caryl Comes  Acute diastolic heart failure with secondary pulmonary hypertension -Diuresed on admission, held yesterday due to worsening renal function.  Appears euvolemic, continue to hold lasix.  Essential hypertension -BP remains elevated -Losartan has been on hold because of rising creatinine -On hydralazine 75 mg TID and amlodipine 5 mg daily.  Will increase amlodipine to 5mg  BID (home dose)     For questions or updates, please contact [place] Please consult [email or web address] for contact info under        Signed, Donato Heinz, MD  08/02/2020, 9:49 AM

## 2020-08-02 NOTE — Progress Notes (Addendum)
Patient A&O X 4, able to make needs known, denies pain or discomfort. She was NPO except small sips with meds this am for PPM placement today. Preop orders noted in the chart, Hibiclens bath to surgical area; completed. Other orders for Ancef and gentamycin also on the [facility]; however, these are known to be pre and intraop orders, so they were not completed. NS started in both peripheral IVs @ 82mL/hr. Ancef pulled from Pyxis and hung-not administered- on call to cath lab. Consent completed and in chart, patient understands procedure to be done and had no additional questions. Patient left for cath lab around 1330, awaiting return.

## 2020-08-02 NOTE — Progress Notes (Addendum)
Patient is to be on bedrest with BRP only until the morning. She will be receiving IV Ancef Q6hrs, beginning 2000 this evening. She is already tolerating regular food and ordered dinner after receiving 2units of novolog insulin coverage for CBG of 149. We discussed the options available for pain control should she need something for discomfort, and she verbalized understanding while denying pain at this time. Also, order received for senna x 1 this evening for no BM in 72hours; patient denies feelings of constipation, only no urge to have a BM since Friday 07/30/20. Daughter is at bedside, SCDs placed. Will continue to monitor and provide assist as needed.

## 2020-08-02 NOTE — Progress Notes (Signed)
Received call from cath lab, patient procedure complete. Medtronic Pacer implanted with setting of VVI at 50-90. Patient to arrive with sling to left arm and left arm IV removed.

## 2020-08-02 NOTE — Progress Notes (Signed)
Received call from cath lab. They will be coming to get patient for PPM shortly. Pulled Ancef but they asked not to administer; they will do so in the lab prior to procedure.

## 2020-08-03 ENCOUNTER — Inpatient Hospital Stay (HOSPITAL_COMMUNITY): Payer: Medicare Other

## 2020-08-03 ENCOUNTER — Encounter (HOSPITAL_COMMUNITY): Payer: Self-pay | Admitting: Internal Medicine

## 2020-08-03 DIAGNOSIS — I5031 Acute diastolic (congestive) heart failure: Secondary | ICD-10-CM | POA: Diagnosis not present

## 2020-08-03 DIAGNOSIS — I442 Atrioventricular block, complete: Secondary | ICD-10-CM | POA: Diagnosis not present

## 2020-08-03 DIAGNOSIS — I4891 Unspecified atrial fibrillation: Secondary | ICD-10-CM

## 2020-08-03 DIAGNOSIS — I10 Essential (primary) hypertension: Secondary | ICD-10-CM | POA: Diagnosis not present

## 2020-08-03 LAB — BASIC METABOLIC PANEL
Anion gap: 12 (ref 5–15)
BUN: 59 mg/dL — ABNORMAL HIGH (ref 8–23)
CO2: 20 mmol/L — ABNORMAL LOW (ref 22–32)
Calcium: 8.9 mg/dL (ref 8.9–10.3)
Chloride: 108 mmol/L (ref 98–111)
Creatinine, Ser: 2.53 mg/dL — ABNORMAL HIGH (ref 0.44–1.00)
GFR, Estimated: 18 mL/min — ABNORMAL LOW (ref >60)
Glucose, Bld: 158 mg/dL — ABNORMAL HIGH (ref 70–99)
Potassium: 3.9 mmol/L (ref 3.5–5.1)
Sodium: 140 mmol/L (ref 135–145)

## 2020-08-03 LAB — GLUCOSE, CAPILLARY
Glucose-Capillary: 202 mg/dL — ABNORMAL HIGH (ref 70–99)
Glucose-Capillary: 135 mg/dL — ABNORMAL HIGH (ref 70–99)
Glucose-Capillary: 209 mg/dL — ABNORMAL HIGH (ref 70–99)

## 2020-08-03 MED ORDER — HYDRALAZINE HCL 25 MG PO TABS
75.0000 mg | ORAL_TABLET | Freq: Three times a day (TID) | ORAL | 0 refills | Status: DC
Start: 2020-08-03 — End: 2020-10-20

## 2020-08-03 NOTE — Progress Notes (Addendum)
Electrophysiology Rounding Note  Patient Name: [name] Date of Encounter: 08/03/2020  Primary Cardiologist: Dorris Carnes, MD Electrophysiologist: Dr. Caryl Comes   Subjective   S/p single chamber medtronic PPM 08/02/2020 CXR this am with stable lead placement and no ptx.  The patient is doing well today. Somewhat sore from surgery. At this time, the patient denies chest pain, shortness of breath, or any new concerns.  Inpatient Medications    Scheduled Meds: . amLODipine  5 mg Oral BID  . guaiFENesin  600 mg Oral BID  . hydrALAZINE  75 mg Oral TID  . insulin aspart  0-15 Units Subcutaneous TID WC  . loratadine  10 mg Oral Daily  . mometasone-formoterol  2 puff Inhalation BID  . montelukast  10 mg Oral QHS  . pantoprazole  40 mg Oral Daily  . pravastatin  40 mg Oral Daily  . sodium chloride flush  3 mL Intravenous Q12H   Continuous Infusions: . sodium chloride    .  ceFAZolin (ANCEF) IV 1 g (08/03/20 0303)   PRN Meds: sodium chloride, acetaminophen, albuterol, ipratropium-albuterol, menthol-cetylpyridinium, ondansetron (ZOFRAN) IV, oxyCODONE, sodium chloride flush   Vital Signs    Vitals:   08/02/20 2028 08/02/20 2200 08/03/20 0018 08/03/20 0318  BP:  (!) 155/62  134/61  Pulse:  (!) 50  (!) 50  Resp:  16  18  Temp:  98.1 F (36.7 C)  98.2 F (36.8 C)  TempSrc:  Oral  Oral  SpO2: 98% 97%  98%  Weight:   80.8 kg   Height:        Intake/Output Summary (Last 24 hours) at 08/03/2020 0732 Last data filed at 08/03/2020 0303 Gross per 24 hour  Intake 533 ml  Output 700 ml  Net -167 ml   Filed Weights   08/01/20 0300 08/02/20 0100 08/03/20 0018  Weight: 82 kg 80.6 kg 80.8 kg    Physical Exam    GEN- The patient is well appearing, alert and oriented x 3 today.   Head- normocephalic, atraumatic Eyes-  Sclera clear, conjunctiva pink Ears- hearing intact Oropharynx- clear Neck- supple Lungs- Clear to ausculation bilaterally, normal work of breathing Heart-  Regular rate and rhythm, no murmurs, rubs or gallops GI- soft, NT, ND, + BS Extremities- no clubbing or cyanosis. No edema Skin- no rash or lesion Psych- euthymic mood, full affect Neuro- strength and sensation are intact  Labs    CBC Recent Labs    08/02/20 0755  WBC 7.8  NEUTROABS 6.0  HGB 12.1  HCT 35.0*  MCV 79.2*  PLT 341   Basic Metabolic Panel Recent Labs    08/02/20 0755 08/02/20 1628 08/03/20 0305  NA 139  --  140  K 3.1*  --  3.9  CL 106  --  108  CO2 19*  --  20*  GLUCOSE 150*  --  158*  BUN 62*  --  59*  CREATININE 2.91*  --  2.53*  CALCIUM 8.7*  --  8.9  MG 1.9 2.1  --    Liver Function Tests No results for input(s): AST, ALT, ALKPHOS, BILITOT, PROT, ALBUMIN in the last 72 hours. No results for input(s): LIPASE, AMYLASE in the last 72 hours. Cardiac Enzymes No results for input(s): CKTOTAL, CKMB, CKMBINDEX, TROPONINI in the last 72 hours.   Telemetry    V pacing at 50 (personally reviewed)  Radiology    DG Chest 2 View  Result Date: 08/03/2020 CLINICAL DATA:  Cardiac device in  situ. EXAM: CHEST - 2 VIEW COMPARISON:  07/29/2020. FINDINGS: Cardiac pacer noted with lead tip over the right ventricle. Cardiomegaly with mild pulmonary venous congestion. Prior interstitial prominence has improved suggesting resolving interstitial edema. Small left pleural effusion. No pneumothorax. Degenerative changes scoliosis thoracic spine. IMPRESSION: Cardiac pacer with lead tip over the right ventricle. Cardiomegaly with mild pulmonary venous congestion. Prior interstitial prominence has improved suggesting resolving interstitial edema. No pneumothorax. Electronically Signed   By: Marcello Moores  Register   On: 08/03/2020 06:39    Patient Profile     84 y.o.femalewith symptomatic bradycardia atrial fibrillation heart rate in the upper 30s with chronotropic incompetence  Assessment & Plan    1. CHB with junctional escape / Bradycardia with out chronotropic  response S/p Medtronic single chamber pacer 08/02/2020 CXR this am with stable lead placement and no PTx.   2. Permanent Atrial fibrillation Resume eliquis on Friday.   3. HTN Stable  4. Hypokalemia K 3.9 this am.   5. AKI Dr. Caryl Comes would like labs repeated on Friday if she goes home today. This has been arranged.   6. MS Encouraged to keep follow up with Dr. Harrington Challenger at the end of the month.   EP to see as needed if remains here.   For questions or updates, please contact [place] Please consult [email or web address] for contact info under Cardiology/STEMI.  Signed, Shirley Friar, PA-C  08/03/2020, 7:32 AM   (As above) Device programmed at relatively low rates to minimize issues related to her Mitral stenosis  Instructions given  CXR reviewd  Device function normal

## 2020-08-03 NOTE — Care Management Important Message (Signed)
Important Message  Patient Details  Name: ORA BOLLIG MRN: [id number] Date of Birth: 1936-09-27   Medicare Important Message Given:  Yes     Shelda Altes 08/03/2020, 9:47 AM

## 2020-08-03 NOTE — TOC Transition Note (Signed)
Transition of Care [facility]) - CM/SW Discharge Note   Patient Details  Name: [name] MRN: [id number] Date of Birth: 1936-03-08  Transition of Care [facility]) CM/SW Contact:  Zenon Mayo, RN Phone Number: 08/03/2020, 3:37 PM   Clinical Narrative:    Patient is for dc , NCM offered choice , her daughter at bedside states [facility] is ok for HHPT.  NCM made referral to [facility] with [facility] for HHPT.  She is able to take referral. Soc will begin 24 to 48 hrs post dc.  Patient states she will be going home tomorrow per the MD.   Final next level of care: [place] Barriers to Discharge: No Barriers Identified   Patient Goals and CMS Choice Patient states their goals for this hospitalization and ongoing recovery are:: get back to where she can do for her self CMS Medicare.gov Compare Post Acute Care list provided to:: Patient Represenative (must comment) Choice offered to / list presented to : Adult Children  Discharge Placement                       Discharge Plan and Services                  DME Agency: NA       HH Arranged: PT HH Agency: [place] Date HH Agency Contacted: 08/03/20 Time [place]: [id number] Representative spoke with at [place]: March [facility] Determinants of Health ([place]) Interventions     Readmission Risk Interventions No flowsheet data found.

## 2020-08-03 NOTE — Progress Notes (Signed)
Progress Note  Patient Name: SHYANN HEFNER Date of Encounter: 08/03/2020  [facility] Cardiologist: Dorris Carnes, MD   Subjective   Denies any chest pain, dyspnea, or lightheadedness  Inpatient Medications    Scheduled Meds: . amLODipine  5 mg Oral BID  . guaiFENesin  600 mg Oral BID  . hydrALAZINE  75 mg Oral TID  . insulin aspart  0-15 Units Subcutaneous TID WC  . loratadine  10 mg Oral Daily  . mometasone-formoterol  2 puff Inhalation BID  . montelukast  10 mg Oral QHS  . pantoprazole  40 mg Oral Daily  . pravastatin  40 mg Oral Daily  . sodium chloride flush  3 mL Intravenous Q12H   Continuous Infusions: . sodium chloride    .  ceFAZolin (ANCEF) IV 1 g (08/03/20 0303)   PRN Meds: sodium chloride, acetaminophen, albuterol, ipratropium-albuterol, menthol-cetylpyridinium, ondansetron (ZOFRAN) IV, oxyCODONE, sodium chloride flush   Vital Signs    Vitals:   08/02/20 2200 08/03/20 0018 08/03/20 0318 08/03/20 0741  BP: (!) 155/62  134/61   Pulse: (!) 50  (!) 50   Resp: 16  18   Temp: 98.1 F (36.7 C)  98.2 F (36.8 C)   TempSrc: Oral  Oral   SpO2: 97%  98% 98%  Weight:  80.8 kg    Height:        Intake/Output Summary (Last 24 hours) at 08/03/2020 0912 Last data filed at 08/03/2020 0303 Gross per 24 hour  Intake 533 ml  Output 700 ml  Net -167 ml   Last 3 Weights 08/03/2020 08/02/2020 08/01/2020  Weight (lbs) 178 lb 3.2 oz 177 lb 11.1 oz 180 lb 12.4 oz  Weight (kg) 80.831 kg 80.6 kg 82 kg      Telemetry    Atrial fibrillation heart rate in the 30s- Personally Reviewed  ECG    No new- Personally Reviewed  Physical Exam   GEN: No acute distress.   Neck: No JVD Cardiac:  Bradycardic, regular, no murmurs, rubs, or gallops.  Respiratory: Clear to auscultation bilaterally. GI: Soft, nontender, non-distended  MS: No edema; No deformity. Neuro:  Nonfocal  Psych: Normal affect   Labs    High Sensitivity Troponin:   Recent Labs  Lab 07/29/20 1121  07/29/20 1704  TROPONINIHS 8 11      Chemistry Recent Labs  Lab 07/30/20 0357 07/31/20 0604 08/01/20 0946 08/02/20 0755 08/03/20 0305  NA 136   < > 136 139 140  K 4.3   < > 3.2* 3.1* 3.9  CL 107   < > 103 106 108  CO2 17*   < > 21* 19* 20*  GLUCOSE 215*   < > 126* 150* 158*  BUN 30*   < > 60* 62* 59*  CREATININE 2.06*   < > 2.78* 2.91* 2.53*  CALCIUM 9.5   < > 8.9 8.7* 8.9  PROT 6.5  --   --   --   --   ALBUMIN 3.5  --   --   --   --   AST 25  --   --   --   --   ALT 44  --   --   --   --   ALKPHOS 74  --   --   --   --   BILITOT 1.1  --   --   --   --   GFRNONAA 24*   < > 16* 16* 18*  ANIONGAP 12   < >  12 14 12    < > = values in this interval not displayed.     Hematology Recent Labs  Lab 07/30/20 0357 07/31/20 0604 08/02/20 0755  WBC 6.6 9.3 7.8  RBC 3.84* 3.81* 4.42  HGB 10.6* 10.5* 12.1  HCT 30.7* 30.9* 35.0*  MCV 79.9* 81.1 79.2*  MCH 27.6 27.6 27.4  MCHC 34.5 34.0 34.6  RDW 15.4 15.2 14.8  PLT 168 165 202    BNP Recent Labs  Lab 07/29/20 1121  BNP 371.9*     DDimer No results for input(s): DDIMER in the last 168 hours.   Radiology    DG Chest 2 View  Result Date: 08/03/2020 CLINICAL DATA:  Cardiac device in situ. EXAM: CHEST - 2 VIEW COMPARISON:  07/29/2020. FINDINGS: Cardiac pacer noted with lead tip over the right ventricle. Cardiomegaly with mild pulmonary venous congestion. Prior interstitial prominence has improved suggesting resolving interstitial edema. Small left pleural effusion. No pneumothorax. Degenerative changes scoliosis thoracic spine. IMPRESSION: Cardiac pacer with lead tip over the right ventricle. Cardiomegaly with mild pulmonary venous congestion. Prior interstitial prominence has improved suggesting resolving interstitial edema. No pneumothorax. Electronically Signed   By: Marcello Moores  Register   On: 08/03/2020 06:39    Cardiac Studies   Echo 07/29/2020 1. Left ventricular ejection fraction, by estimation, is 65 to 70%. The   left ventricle has hyperdynamic function. The left ventricle has no  regional wall motion abnormalities. There is mild left ventricular  hypertrophy. Left ventricular diastolic  parameters are indeterminate.  2. Right ventricular systolic function is normal. The right ventricular  size is normal. There is severely elevated pulmonary artery systolic  pressure. The estimated right ventricular systolic pressure is 10.1 mmHg.  3. Left atrial size was moderately dilated.  4. Right atrial size was mildly dilated.  5. The mitral valve is degenerative. No evidence of mitral valve  regurgitation. Moderate mitral stenosis. The mean mitral valve gradient is  7.0 mmHg. Moderate mitral annular calcification.  6. Tricuspid valve regurgitation is moderate.  7. The aortic valve is tricuspid. Aortic valve regurgitation is not  visualized. Mild aortic valve sclerosis is present, with no evidence of  aortic valve stenosis.  8. The inferior vena cava is dilated in size with <50% respiratory  variability, suggesting right atrial pressure of 15 mmHg.  9. Technically difficult study with poor acoustic windows.   Patient Profile     84 y.o. female with symptomatic bradycardia atrial fibrillation heart rate in the upper 30s with chronotropic incompetence  Assessment & Plan    CHB with junctional escape rhythm -s/p PPM yesterday with Dr Caryl Comes  Acute diastolic heart failure with secondary pulmonary hypertension -Diuresed on admission, held due to worsening renal function.  Appears euvolemic and renal function improving, continue to hold diuresis  Essential hypertension -BP improved -Losartan has been on hold because of rising creatinine -Cotninue hydralazine 75 mg TID and amlodipine 5 mg BID     For questions or updates, please contact [facility] Please consult [email or web address] for contact info under        Signed, Donato Heinz, MD  08/03/2020, 9:12 AM

## 2020-08-03 NOTE — Discharge Instructions (Signed)
Resume Eliquis on Friday.

## 2020-08-03 NOTE — Progress Notes (Signed)
Physical Therapy Treatment Patient Details Name: [name] MRN: [id number] DOB: [birth year] Today's Date: 08/03/2020    History of Present Illness 84 y.o. female with a hx of permanent atrial fibrillation, CAD, chronic diastolic CHF, DM, HLD, HTN and lung nodules who presents for examination of bradycardia. Patient s/p pacemaker implantation on 11/1.    PT Comments    Patient progressing towards physical therapy goals. Patient ambulated 40' with no AD and min guard for safety, pt demos slow pace, decreased stride length, and little to no arm swing. Patient overall modI for bed mobility and transfers. Discussed with patient about benefits for use of rollator for energy conservation and support, however patient declines need. Patient continues to be limited by decreased activity tolerance, generalized weakness, impaired balance. Continue to recommend HHPT following discharge to address listed deficits. PT will continue to follow acutely.    Follow Up Recommendations  Home health PT;Supervision for mobility/OOB     Equipment Recommendations  None recommended by PT    Recommendations for Other Services       Precautions / Restrictions Precautions Precautions: Fall;Other (comment) Precaution Comments: watch HR, BP    Mobility  Bed Mobility Overal bed mobility: Modified Independent                Transfers Overall transfer level: Modified independent Equipment used: None                Ambulation/Gait Ambulation/Gait assistance: Min guard Gait Distance (Feet): 75 Feet Assistive device: None Gait Pattern/deviations: Step-through pattern;Decreased stride length Gait velocity: decreased Gait velocity interpretation: <1.31 ft/sec, indicative of household ambulator General Gait Details: pt ambulated 20' with no AD and min guard for safety. very slow pace, decreased stride length and little to no arm swing   Stairs             Wheelchair Mobility    Modified  [place] (Stroke Patients Only)       Balance Overall balance assessment: Mild deficits observed, not formally tested                                          Cognition Arousal/Alertness: Awake/alert Behavior During Therapy: WFL for tasks assessed/performed Overall Cognitive Status: Within Functional Limits for tasks assessed                                        Exercises      General Comments        Pertinent Vitals/Pain Pain Assessment: Faces Pain Score: 2  Faces Pain Scale: Hurts a little bit Pain Location: L shoulder Pain Descriptors / Indicators: Aching;Sore;Operative site guarding Pain Intervention(s): Limited activity within patient's tolerance;Monitored during session;Repositioned    Home Living                      Prior Function            PT Goals (current goals can now be found in the care plan section) Acute Rehab PT Goals Patient Stated Goal: not have to use a [name] PT Goal Formulation: With patient Time For Goal Achievement: 08/13/20 Potential to Achieve Goals: Good Progress towards PT goals: Progressing toward goals    Frequency    Min 3X/week      PT Plan Current  plan remains appropriate    Co-evaluation              AM-PAC PT "6 Clicks" Mobility   Outcome Measure  Help needed turning from your back to your side while in a flat bed without using bedrails?: None Help needed moving from lying on your back to sitting on the side of a flat bed without using bedrails?: None Help needed moving to and from a bed to a chair (including a wheelchair)?: None Help needed standing up from a chair using your arms (e.g., wheelchair or bedside chair)?: None Help needed to walk in hospital room?: None Help needed climbing 3-5 steps with a railing? : A Little 6 Click Score: 23    End of Session Equipment Utilized During Treatment: Gait belt Activity Tolerance: Patient tolerated treatment  well Patient left: in bed;with call bell/phone within reach Nurse Communication: Mobility status PT Visit Diagnosis: Unsteadiness on feet (R26.81);Difficulty in walking, not elsewhere classified (R26.2)     Time: 3112-1624 PT Time Calculation (min) (ACUTE ONLY): 18 min  Charges:  $Therapeutic Activity: 8-22 mins                     Perrin Maltese, PT, DPT Acute Rehabilitation Services Pager [phone] Office [phone]    [name] 08/03/2020, 2:56 PM

## 2020-08-03 NOTE — Progress Notes (Signed)
Patient returned from x-ray safely to unit and room. No patient complaints at this time. Call bell within reach.

## 2020-08-03 NOTE — Discharge Summary (Signed)
Physician Discharge Summary  [name] PTW:[id number] DOB: 1935/12/01 DOA: 07/29/2020  PCP: [name], FNP  Admit date: 07/29/2020 Discharge date: 08/03/2020  Admitted From: home  Disposition:  home   Recommendations for Outpatient Follow-up:  1. Labs to be repeated on Friday by EP - Losartan still on hold- please resume as needed  Home Health:  PT ordered Discharge Condition:  stable   CODE STATUS:  Full code  Diet recommendation:  Heart healthy Consultations:  Cardiology  EP  Procedures/Studies: . Pacemaker    Discharge Diagnoses:  Principal Problem:   A-fib  With complete heart block Active Problems: Acute on  Chronic diastolic CHF (congestive heart failure) (HCC)   Essential hypertension   Cough variant asthma   GERD   CAD (coronary artery disease)     Brief Summary: [name] has a past medical history of hypertension, chronic diastolic heart failure, diabetes mellitus type 2, anemia of chronic disease, chronic kidney disease, asthma admitted for dyspnea, mainly on exertion ongoing for a few wks and pedal edema   Hospital Course:  Principal Problem:   Shortness of breath - ? If related to severe bradycardia in addition to acute CHF with underlying severe PAH  Active Problems:  A-fib, symptomatic bradycardia, complete heart block with junctional escape- - s/p pacemaker placement on Monday  - she is doing well and is stable for d/c  Acute on chronic diastolic CHF CKD 3-4 - treated with IV Lasix and resolved-Lasix has been on hold as Cr rose slightly from 2.0 on admission to 2.91- trending down - weight today is 56- cardiology feels she is euvolemic- will cont to Hold lasix today and have her resume tomorrow    Essential hypertension - holding Losartan due to rising Cr - Hydralazine increased from 25 TID to 75 TID - cont Amlodipine at 5 mg daily    Cough variant asthma/ COPD - no wheezing noted on exam   Discharge Exam: Vitals:    08/03/20 0318 08/03/20 0741  BP: 134/61   Pulse: (!) 50   Resp: 18   Temp: 98.2 F (36.8 C)   SpO2: 98% 98%   Vitals:   08/02/20 2200 08/03/20 0018 08/03/20 0318 08/03/20 0741  BP: (!) 155/62  134/61   Pulse: (!) 50  (!) 50   Resp: 16  18   Temp: 98.1 F (36.7 C)  98.2 F (36.8 C)   TempSrc: Oral  Oral   SpO2: 97%  98% 98%  Weight:  80.8 kg    Height:        General: Pt is alert, awake, not in acute distress Cardiovascular: RRR, S1/S2 +, no rubs, no gallops Respiratory: CTA bilaterally, no wheezing, no rhonchi Abdominal: Soft, NT, ND, bowel sounds + Extremities: no edema, no cyanosis   Discharge Instructions  Discharge Instructions    (HEART FAILURE PATIENTS) Call MD:  Anytime you have any of the following symptoms: 1) 3 pound weight gain in 24 hours or 5 pounds in 1 week 2) shortness of breath, with or without a dry hacking cough 3) swelling in the hands, feet or stomach 4) if you have to sleep on extra pillows at night in order to breathe.   Complete by: As directed    Diet - low sodium heart healthy   Complete by: As directed    Increase activity slowly   Complete by: As directed    No wound care   Complete by: As directed  Allergies as of 08/03/2020      Reactions   Aspirin Shortness Of Breath, Other (See Comments)   Caused asthma symptoms   Ramipril Cough      Medication List    STOP taking these medications   Eliquis 2.5 MG Tabs tablet Generic drug: apixaban   losartan 100 MG tablet Commonly known as: COZAAR   nystatin 100000 UNIT/ML suspension Commonly known as: MYCOSTATIN   predniSONE 10 MG tablet Commonly known as: DELTASONE     TAKE these medications   acetaminophen 325 MG tablet Commonly known as: TYLENOL Take 650 mg by mouth every 6 (six) hours as needed for pain.   albuterol 108 (90 Base) MCG/ACT inhaler Commonly known as: ProAir HFA Inhale 2 puffs into the lungs every 4 (four) hours as needed for wheezing.   amLODipine 5 MG  tablet Commonly known as: NORVASC TAKE 1 TABLET BY MOUTH TWICE A DAY What changed: when to take this   budesonide-formoterol 160-4.5 MCG/ACT inhaler Commonly known as: Symbicort TAKE 2 PUFFS FIRST THING IN AM AND THEN ANOTHER 2 PUFFS ABOUT 12 HOURS LATER. What changed:   how much to take  how to take this  when to take this  additional instructions   cetirizine 10 MG tablet Commonly known as: ZyrTEC Allergy Take 1 tablet (10 mg total) by mouth daily as needed for allergies.   furosemide 40 MG tablet Commonly known as: LASIX Take 1 tablet (40 mg total) by mouth daily.   hydrALAZINE 25 MG tablet Commonly known as: APRESOLINE Take 3 tablets (75 mg total) by mouth 3 (three) times daily. What changed: how much to take   insulin lispro 100 UNIT/ML injection Commonly known as: HUMALOG 5 UNITS WITH BREAKFAST, AND 25 UNITS WITH SUPPER What changed:   how much to take  how to take this  when to take this   montelukast 10 MG tablet Commonly known as: SINGULAIR TAKE 1 TABLET BY MOUTH AT BEDTIME What changed:   how much to take  how to take this  when to take this  additional instructions   omeprazole 40 MG capsule Commonly known as: PRILOSEC Take 1 capsule (40 mg total) by mouth daily.   ONE TOUCH ULTRA 2 w/Device Kit Check blood sugar 3 times per day.   potassium chloride 10 MEQ tablet Commonly known as: KLOR-CON TAKE 1 TABLET BY MOUTH EVERY DAY   pravastatin 40 MG tablet Commonly known as: PRAVACHOL Take 1 tablet (40 mg total) by mouth daily.       Follow-up Information    Deboraha Sprang, MD Follow up on 11/16/2020.   Specialty: Cardiology Why: at 315 pm for 3 month pacemaker check Contact information: [address] [place] [address] [phone]        [facility] Follow up on 08/17/2020.   Why: at 2 pm for post pacemaker wound check Contact information: [place] [address] [phone]       [facility] Follow up on 08/06/2020.   Specialty: Cardiology Why: at 3 pm for follow up labwork. Contact information: [address] [place] [phone]       [facility] Follow up.   Why: HHPT, please call cheryl  at [phone] if you do not here from them within 48 hrs  Contact information: [place] [address] [phone]  Allergies  Allergen Reactions  . Aspirin Shortness Of Breath and Other (See Comments)    Caused asthma symptoms  . Ramipril Cough      DG Chest 2 View  Result Date: 08/03/2020 CLINICAL DATA:  Cardiac device in situ. EXAM: CHEST - 2 VIEW COMPARISON:  07/29/2020. FINDINGS: Cardiac pacer noted with lead tip over the right ventricle. Cardiomegaly with mild pulmonary venous congestion. Prior interstitial prominence has improved suggesting resolving interstitial edema. Small left pleural effusion. No pneumothorax. Degenerative changes scoliosis thoracic spine. IMPRESSION: Cardiac pacer with lead tip over the right ventricle. Cardiomegaly with mild pulmonary venous congestion. Prior interstitial prominence has improved suggesting resolving interstitial edema. No pneumothorax. Electronically Signed   By: Marcello Moores  Register   On: 08/03/2020 06:39   DG Chest 2 View  Result Date: 07/29/2020 CLINICAL DATA:  Shortness of breath. EXAM: CHEST - 2 VIEW COMPARISON:  None. FINDINGS: The cardio pericardial silhouette is enlarged. There is pulmonary vascular congestion without overt pulmonary edema. No focal airspace consolidation. The visualized bony structures of the thorax show no acute abnormality. IMPRESSION: Enlarged cardiopericardial silhouette with pulmonary vascular congestion. Possible interstitial edema. Electronically Signed   By: Misty Stanley M.D.   On: 07/29/2020 12:11    ECHOCARDIOGRAM COMPLETE  Result Date: 07/29/2020    ECHOCARDIOGRAM REPORT   Patient Name:   [name] Date of Exam: 07/29/2020 Medical Rec #:  [id number]   Height:       67.0 in Accession #:    [id number]  Weight:       188.1 lb Date of Birth:  27-Jan-1936    BSA:          1.970 m Patient Age:    37 years    BP:           196/59 mmHg Patient Gender: F           HR:           38 bpm. Exam Location:  Inpatient Procedure: 2D Echo Indications:    CHF-Acute Diastolic 754.49 / E01.00  History:        Patient has prior history of Echocardiogram examinations, most                 recent 01/21/2020. Risk Factors:Diabetes, Hypertension and                 Dyslipidemia. Anemic. Chronic kidney disease. Asthma.  Sonographer:    Darlina Sicilian RDCS Referring Phys: Leon  1. Left ventricular ejection fraction, by estimation, is 65 to 70%. The left ventricle has hyperdynamic function. The left ventricle has no regional wall motion abnormalities. There is mild left ventricular hypertrophy. Left ventricular diastolic parameters are indeterminate.  2. Right ventricular systolic function is normal. The right ventricular size is normal. There is severely elevated pulmonary artery systolic pressure. The estimated right ventricular systolic pressure is 71.2 mmHg.  3. Left atrial size was moderately dilated.  4. Right atrial size was mildly dilated.  5. The mitral valve is degenerative. No evidence of mitral valve regurgitation. Moderate mitral stenosis. The mean mitral valve gradient is 7.0 mmHg. Moderate mitral annular calcification.  6. Tricuspid valve regurgitation is moderate.  7. The aortic valve is tricuspid. Aortic valve regurgitation is not visualized. Mild aortic valve sclerosis is present, with no evidence of aortic valve stenosis.  8. The inferior vena cava is dilated in size with <50% respiratory variability, suggesting right atrial pressure of 15 mmHg.  9. Technically  difficult study with poor  acoustic windows. FINDINGS  Left Ventricle: Left ventricular ejection fraction, by estimation, is 65 to 70%. The left ventricle has hyperdynamic function. The left ventricle has no regional wall motion abnormalities. The left ventricular internal cavity size was normal in size. There is mild left ventricular hypertrophy. Left ventricular diastolic parameters are indeterminate. Right Ventricle: The right ventricular size is normal. No increase in right ventricular wall thickness. Right ventricular systolic function is normal. There is severely elevated pulmonary artery systolic pressure. The tricuspid regurgitant velocity is 3.63 m/s, and with an assumed right atrial pressure of 15 mmHg, the estimated right ventricular systolic pressure is 69.4 mmHg. Left Atrium: Left atrial size was moderately dilated. Right Atrium: Right atrial size was mildly dilated. Pericardium: Trivial pericardial effusion is present. Mitral Valve: The mitral valve is degenerative in appearance. There is moderate calcification of the mitral valve leaflet(s). Moderate mitral annular calcification. No evidence of mitral valve regurgitation. Moderate mitral valve stenosis. MV peak gradient, 23.0 mmHg. The mean mitral valve gradient is 7.0 mmHg. Tricuspid Valve: The tricuspid valve is normal in structure. Tricuspid valve regurgitation is moderate. Aortic Valve: The aortic valve is tricuspid. Aortic valve regurgitation is not visualized. Mild aortic valve sclerosis is present, with no evidence of aortic valve stenosis. Aortic valve mean gradient measures 6.0 mmHg. Aortic valve peak gradient measures 17.5 mmHg. Aortic valve area, by VTI measures 1.42 cm. Pulmonic Valve: The pulmonic valve was normal in structure. Pulmonic valve regurgitation is trivial. Aorta: The aortic root is normal in size and structure. Venous: The inferior vena cava is dilated in size with less than 50% respiratory variability, suggesting right atrial pressure of 15 mmHg.  IAS/Shunts: No atrial level shunt detected by color flow Doppler.  LEFT VENTRICLE PLAX 2D LVIDd:         4.90 cm LVIDs:         2.60 cm LV PW:         1.00 cm LV IVS:        1.10 cm LVOT diam:     1.60 cm LV SV:         63 LV SV Index:   32 LVOT Area:     2.01 cm  RIGHT VENTRICLE RV S prime:     11.40 cm/s LEFT ATRIUM             Index       RIGHT ATRIUM           Index LA diam:        4.80 cm 2.44 cm/m  RA Area:     16.30 cm LA Vol (A2C):   72.0 ml 36.55 ml/m RA Volume:   35.20 ml  17.87 ml/m LA Vol (A4C):   79.3 ml 40.25 ml/m LA Biplane Vol: 80.1 ml 40.66 ml/m  AORTIC VALVE AV Area (Vmax):    1.45 cm AV Area (Vmean):   1.60 cm AV Area (VTI):     1.42 cm AV Vmax:           209.00 cm/s AV Vmean:          110.000 cm/s AV VTI:            0.440 m AV Peak Grad:      17.5 mmHg AV Mean Grad:      6.0 mmHg LVOT Vmax:         151.00 cm/s LVOT Vmean:        87.700 cm/s LVOT VTI:  0.311 m LVOT/AV VTI ratio: 0.71  AORTA Ao Root diam: 2.30 cm MITRAL VALVE                TRICUSPID VALVE MV Area (PHT): 2.68 cm     TR Peak grad:   52.7 mmHg MV Peak grad:  23.0 mmHg    TR Vmax:        363.00 cm/s MV Mean grad:  7.0 mmHg MV Vmax:       2.40 m/s     SHUNTS MV Vmean:      106.7 cm/s   Systemic VTI:  0.31 m MV Decel Time: 283 msec     Systemic Diam: 1.60 cm MV E velocity: 236.67 cm/s Loralie Champagne MD Electronically signed by Loralie Champagne MD Signature Date/Time: 07/29/2020/5:41:46 PM    Final      The results of significant diagnostics from this hospitalization (including imaging, microbiology, ancillary and laboratory) are listed below for reference.     Microbiology: Recent Results (from the past 240 hour(s))  Respiratory Panel by RT PCR (Flu A&B, Covid) - Nasopharyngeal Swab     Status: None   Collection Time: 07/29/20 12:11 PM   Specimen: Nasopharyngeal Swab  Result Value Ref Range Status   SARS Coronavirus 2 by RT PCR NEGATIVE NEGATIVE Final    Comment: (NOTE) SARS-CoV-2 target nucleic acids are  NOT DETECTED.  The SARS-CoV-2 RNA is generally detectable in upper respiratoy specimens during the acute phase of infection. The lowest concentration of SARS-CoV-2 viral copies this assay can detect is 131 copies/mL. A negative result does not preclude SARS-Cov-2 infection and should not be used as the sole basis for treatment or other patient management decisions. A negative result may occur with  improper specimen collection/handling, submission of specimen other than nasopharyngeal swab, presence of viral mutation(s) within the areas targeted by this assay, and inadequate number of viral copies (<131 copies/mL). A negative result must be combined with clinical observations, patient history, and epidemiological information. The expected result is Negative.  Fact Sheet for Patients:  PinkCheek.be  Fact Sheet for Healthcare Providers:  GravelBags.it  This test is no t yet approved or cleared by the [place] FDA and  has been authorized for detection and/or diagnosis of SARS-CoV-2 by FDA under an Emergency Use Authorization (EUA). This EUA will remain  in effect (meaning this test can be used) for the duration of the COVID-19 declaration under Section 564(b)(1) of the Act, 21 U.S.C. section 360bbb-3(b)(1), unless the authorization is terminated or revoked sooner.     Influenza A by PCR NEGATIVE NEGATIVE Final   Influenza B by PCR NEGATIVE NEGATIVE Final    Comment: (NOTE) The Xpert Xpress SARS-CoV-2/FLU/RSV assay is intended as an aid in  the diagnosis of influenza from Nasopharyngeal swab specimens and  should not be used as a sole basis for treatment. Nasal washings and  aspirates are unacceptable for Xpert Xpress SARS-CoV-2/FLU/RSV  testing.  Fact Sheet for Patients: PinkCheek.be  Fact Sheet for Healthcare Providers: GravelBags.it  This test is not yet  approved or cleared by the [place] FDA and  has been authorized for detection and/or diagnosis of SARS-CoV-2 by  FDA under an Emergency Use Authorization (EUA). This EUA will remain  in effect (meaning this test can be used) for the duration of the  Covid-19 declaration under Section 564(b)(1) of the Act, 21  U.S.C. section 360bbb-3(b)(1), unless the authorization is  terminated or revoked. Performed at [facility], [place]  [address] [place], [place] [address]   Surgical PCR screen     Status: None   Collection Time: 07/30/20  4:21 PM   Specimen: Nasal Mucosa; Nasal Swab  Result Value Ref Range Status   MRSA, PCR NEGATIVE NEGATIVE Final   Staphylococcus aureus NEGATIVE NEGATIVE Final    Comment: (NOTE) The Xpert SA Assay (FDA approved for NASAL specimens in patients 6 years of age and older), is one component of a comprehensive surveillance program. It is not intended to diagnose infection nor to guide or monitor treatment. Performed at [facility], [place] [address], [place], [place] [address]      Labs: BNP (last 3 results) Recent Labs    12/20/19 0429 07/29/20 1121  BNP 272.8* 416.3*   Basic Metabolic Panel: Recent Labs  Lab 07/30/20 0357 07/31/20 0604 08/01/20 0946 08/02/20 0755 08/02/20 1628 08/03/20 0305  NA 136 138 136 139  --  140  K 4.3 4.4 3.2* 3.1*  --  3.9  CL 107 106 103 106  --  108  CO2 17* 21* 21* 19*  --  20*  GLUCOSE 215* 205* 126* 150*  --  158*  BUN 30* 46* 60* 62*  --  59*  CREATININE 2.06* 2.51* 2.78* 2.91*  --  2.53*  CALCIUM 9.5 9.3 8.9 8.7*  --  8.9  MG  --   --   --  1.9 2.1  --    Liver Function Tests: Recent Labs  Lab 07/30/20 0357  AST 25  ALT 44  ALKPHOS 74  BILITOT 1.1  PROT 6.5  ALBUMIN 3.5   No results for input(s): LIPASE, AMYLASE in the last 168 hours. No results for input(s): AMMONIA in the last 168 hours. CBC: Recent Labs  Lab 07/29/20 1121 07/30/20 0357 07/31/20 0604 08/02/20 0755  WBC 8.4  6.6 9.3 7.8  NEUTROABS  --   --   --  6.0  HGB 10.6* 10.6* 10.5* 12.1  HCT 31.5* 30.7* 30.9* 35.0*  MCV 82.0 79.9* 81.1 79.2*  PLT 162 168 165 202   Cardiac Enzymes: No results for input(s): CKTOTAL, CKMB, CKMBINDEX, TROPONINI in the last 168 hours. BNP: Invalid input(s): POCBNP CBG: Recent Labs  Lab 08/02/20 1609 08/02/20 2102 08/03/20 0648 08/03/20 1146 08/03/20 1608  GLUCAP 149* 178* 202* 135* 209*   D-Dimer No results for input(s): DDIMER in the last 72 hours. Hgb A1c No results for input(s): HGBA1C in the last 72 hours. Lipid Profile No results for input(s): CHOL, HDL, LDLCALC, TRIG, CHOLHDL, LDLDIRECT in the last 72 hours. Thyroid function studies No results for input(s): TSH, T4TOTAL, T3FREE, THYROIDAB in the last 72 hours.  Invalid input(s): FREET3 Anemia work up No results for input(s): VITAMINB12, FOLATE, FERRITIN, TIBC, IRON, RETICCTPCT in the last 72 hours. Urinalysis    Component Value Date/Time   COLORURINE YELLOW 12/19/2019 1800   APPEARANCEUR CLEAR 12/19/2019 1800   LABSPEC 1.025 12/19/2019 1800   PHURINE 5.0 12/19/2019 1800   GLUCOSEU NEGATIVE 12/19/2019 1800   GLUCOSEU NEGATIVE 06/01/2017 0908   HGBUR NEGATIVE 12/19/2019 1800   HGBUR negative 09/28/2007 0933   BILIRUBINUR SMALL (A) 12/19/2019 1800   KETONESUR 15 (A) 12/19/2019 1800   PROTEINUR 30 (A) 12/19/2019 1800   UROBILINOGEN 0.2 06/01/2017 0908   NITRITE NEGATIVE 12/19/2019 1800   LEUKOCYTESUR TRACE (A) 12/19/2019 1800   Sepsis Labs Invalid input(s): PROCALCITONIN,  WBC,  LACTICIDVEN Microbiology Recent Results (from the past 240 hour(s))  Respiratory Panel by RT PCR (Flu A&B, Covid) -  Nasopharyngeal Swab     Status: None   Collection Time: 07/29/20 12:11 PM   Specimen: Nasopharyngeal Swab  Result Value Ref Range Status   SARS Coronavirus 2 by RT PCR NEGATIVE NEGATIVE Final    Comment: (NOTE) SARS-CoV-2 target nucleic acids are NOT DETECTED.  The SARS-CoV-2 RNA is generally  detectable in upper respiratoy specimens during the acute phase of infection. The lowest concentration of SARS-CoV-2 viral copies this assay can detect is 131 copies/mL. A negative result does not preclude SARS-Cov-2 infection and should not be used as the sole basis for treatment or other patient management decisions. A negative result may occur with  improper specimen collection/handling, submission of specimen other than nasopharyngeal swab, presence of viral mutation(s) within the areas targeted by this assay, and inadequate number of viral copies (<131 copies/mL). A negative result must be combined with clinical observations, patient history, and epidemiological information. The expected result is Negative.  Fact Sheet for Patients:  PinkCheek.be  Fact Sheet for Healthcare Providers:  GravelBags.it  This test is no t yet approved or cleared by the [place] FDA and  has been authorized for detection and/or diagnosis of SARS-CoV-2 by FDA under an Emergency Use Authorization (EUA). This EUA will remain  in effect (meaning this test can be used) for the duration of the COVID-19 declaration under Section 564(b)(1) of the Act, 21 U.S.C. section 360bbb-3(b)(1), unless the authorization is terminated or revoked sooner.     Influenza A by PCR NEGATIVE NEGATIVE Final   Influenza B by PCR NEGATIVE NEGATIVE Final    Comment: (NOTE) The Xpert Xpress SARS-CoV-2/FLU/RSV assay is intended as an aid in  the diagnosis of influenza from Nasopharyngeal swab specimens and  should not be used as a sole basis for treatment. Nasal washings and  aspirates are unacceptable for Xpert Xpress SARS-CoV-2/FLU/RSV  testing.  Fact Sheet for Patients: PinkCheek.be  Fact Sheet for Healthcare Providers: GravelBags.it  This test is not yet approved or cleared by the [place] FDA and   has been authorized for detection and/or diagnosis of SARS-CoV-2 by  FDA under an Emergency Use Authorization (EUA). This EUA will remain  in effect (meaning this test can be used) for the duration of the  Covid-19 declaration under Section 564(b)(1) of the Act, 21  U.S.C. section 360bbb-3(b)(1), unless the authorization is  terminated or revoked. Performed at [facility], [place] [address], [place], [place] [address]   Surgical PCR screen     Status: None   Collection Time: 07/30/20  4:21 PM   Specimen: Nasal Mucosa; Nasal Swab  Result Value Ref Range Status   MRSA, PCR NEGATIVE NEGATIVE Final   Staphylococcus aureus NEGATIVE NEGATIVE Final    Comment: (NOTE) The Xpert SA Assay (FDA approved for NASAL specimens in patients 62 years of age and older), is one component of a comprehensive surveillance program. It is not intended to diagnose infection nor to guide or monitor treatment. Performed at [facility], [place] [address], [place], [place] [address]      Time coordinating discharge in minutes: 65  SIGNED:   Debbe Odea, MD  [facility] 08/03/2020, 5:01 PM

## 2020-08-06 ENCOUNTER — Other Ambulatory Visit: Payer: Self-pay

## 2020-08-06 ENCOUNTER — Other Ambulatory Visit: Payer: Medicare Other | Admitting: *Deleted

## 2020-08-06 ENCOUNTER — Other Ambulatory Visit: Payer: Self-pay | Admitting: *Deleted

## 2020-08-06 DIAGNOSIS — I5032 Chronic diastolic (congestive) heart failure: Secondary | ICD-10-CM

## 2020-08-07 LAB — CBC
Hematocrit: 34.2 % (ref 34.0–46.6)
Hemoglobin: 12.4 g/dL (ref 11.1–15.9)
MCH: 28.2 pg (ref 26.6–33.0)
MCHC: 36.3 g/dL — ABNORMAL HIGH (ref 31.5–35.7)
MCV: 78 fL — ABNORMAL LOW (ref 79–97)
Platelets: 215 10*3/uL (ref 150–450)
RBC: 4.39 x10E6/uL (ref 3.77–5.28)
RDW: 14.2 % (ref 11.7–15.4)
WBC: 9 10*3/uL (ref 3.4–10.8)

## 2020-08-07 LAB — BASIC METABOLIC PANEL
BUN/Creatinine Ratio: 22 (ref 12–28)
BUN: 41 mg/dL — ABNORMAL HIGH (ref 8–27)
CO2: 19 mmol/L — ABNORMAL LOW (ref 20–29)
Calcium: 9.3 mg/dL (ref 8.7–10.3)
Chloride: 108 mmol/L — ABNORMAL HIGH (ref 96–106)
Creatinine, Ser: 1.89 mg/dL — ABNORMAL HIGH (ref 0.57–1.00)
GFR calc Af Amer: 28 mL/min/{1.73_m2} — ABNORMAL LOW (ref >59)
GFR calc non Af Amer: 24 mL/min/{1.73_m2} — ABNORMAL LOW (ref >59)
Glucose: 103 mg/dL — ABNORMAL HIGH (ref 65–99)
Potassium: 4.5 mmol/L (ref 3.5–5.2)
Sodium: 142 mmol/L (ref 134–144)

## 2020-08-11 ENCOUNTER — Telehealth: Payer: Self-pay | Admitting: Family

## 2020-08-11 DIAGNOSIS — K219 Gastro-esophageal reflux disease without esophagitis: Secondary | ICD-10-CM | POA: Diagnosis not present

## 2020-08-11 DIAGNOSIS — N183 Chronic kidney disease, stage 3 unspecified: Secondary | ICD-10-CM | POA: Diagnosis not present

## 2020-08-11 DIAGNOSIS — E785 Hyperlipidemia, unspecified: Secondary | ICD-10-CM | POA: Diagnosis not present

## 2020-08-11 DIAGNOSIS — Z7951 Long term (current) use of inhaled steroids: Secondary | ICD-10-CM | POA: Diagnosis not present

## 2020-08-11 DIAGNOSIS — J302 Other seasonal allergic rhinitis: Secondary | ICD-10-CM | POA: Diagnosis not present

## 2020-08-11 DIAGNOSIS — I442 Atrioventricular block, complete: Secondary | ICD-10-CM | POA: Diagnosis not present

## 2020-08-11 DIAGNOSIS — Z794 Long term (current) use of insulin: Secondary | ICD-10-CM | POA: Diagnosis not present

## 2020-08-11 DIAGNOSIS — Z48812 Encounter for surgical aftercare following surgery on the circulatory system: Secondary | ICD-10-CM | POA: Diagnosis not present

## 2020-08-11 DIAGNOSIS — E559 Vitamin D deficiency, unspecified: Secondary | ICD-10-CM | POA: Diagnosis not present

## 2020-08-11 DIAGNOSIS — J45991 Cough variant asthma: Secondary | ICD-10-CM | POA: Diagnosis not present

## 2020-08-11 DIAGNOSIS — I130 Hypertensive heart and chronic kidney disease with heart failure and stage 1 through stage 4 chronic kidney disease, or unspecified chronic kidney disease: Secondary | ICD-10-CM | POA: Diagnosis not present

## 2020-08-11 DIAGNOSIS — M810 Age-related osteoporosis without current pathological fracture: Secondary | ICD-10-CM | POA: Diagnosis not present

## 2020-08-11 DIAGNOSIS — D509 Iron deficiency anemia, unspecified: Secondary | ICD-10-CM | POA: Diagnosis not present

## 2020-08-11 DIAGNOSIS — Z950 Presence of cardiac pacemaker: Secondary | ICD-10-CM | POA: Diagnosis not present

## 2020-08-11 DIAGNOSIS — I5033 Acute on chronic diastolic (congestive) heart failure: Secondary | ICD-10-CM | POA: Diagnosis not present

## 2020-08-11 DIAGNOSIS — K649 Unspecified hemorrhoids: Secondary | ICD-10-CM | POA: Diagnosis not present

## 2020-08-11 DIAGNOSIS — K589 Irritable bowel syndrome without diarrhea: Secondary | ICD-10-CM | POA: Diagnosis not present

## 2020-08-11 DIAGNOSIS — I2510 Atherosclerotic heart disease of native coronary artery without angina pectoris: Secondary | ICD-10-CM | POA: Diagnosis not present

## 2020-08-11 DIAGNOSIS — E1122 Type 2 diabetes mellitus with diabetic chronic kidney disease: Secondary | ICD-10-CM | POA: Diagnosis not present

## 2020-08-11 DIAGNOSIS — I4891 Unspecified atrial fibrillation: Secondary | ICD-10-CM | POA: Diagnosis not present

## 2020-08-11 NOTE — Telephone Encounter (Signed)
Okay to give orders as requested;  

## 2020-08-11 NOTE — Telephone Encounter (Signed)
    Mickel Baas from [facility] calling to request order for home PT, and skilled nurse PT 1x week for 6 weeks  Phone [phone], ok to leave a message

## 2020-08-11 NOTE — Telephone Encounter (Signed)
Verbal orders given  

## 2020-08-15 ENCOUNTER — Other Ambulatory Visit: Payer: Self-pay | Admitting: Family

## 2020-08-17 ENCOUNTER — Ambulatory Visit (INDEPENDENT_AMBULATORY_CARE_PROVIDER_SITE_OTHER): Payer: Medicare Other | Admitting: Emergency Medicine

## 2020-08-17 ENCOUNTER — Other Ambulatory Visit: Payer: Self-pay

## 2020-08-17 DIAGNOSIS — I442 Atrioventricular block, complete: Secondary | ICD-10-CM | POA: Diagnosis not present

## 2020-08-17 LAB — CUP PACEART INCLINIC DEVICE CHECK
Battery Remaining Longevity: 157 mo
Battery Voltage: 3.2 V
Brady Statistic RV Percent Paced: 99.79 %
Date Time Interrogation Session: 20211116141901
Implantable Lead Implant Date: 20211101
Implantable Lead Location: 753860
Implantable Lead Model: 3830
Implantable Pulse Generator Implant Date: 20211101
Lead Channel Impedance Value: 399 Ohm
Lead Channel Impedance Value: 513 Ohm
Lead Channel Pacing Threshold Amplitude: 0.75 V
Lead Channel Pacing Threshold Pulse Width: 0.4 ms
Lead Channel Sensing Intrinsic Amplitude: 11.5 mV
Lead Channel Sensing Intrinsic Amplitude: 8.625 mV
Lead Channel Setting Pacing Amplitude: 3.5 V
Lead Channel Setting Pacing Pulse Width: 0.4 ms
Lead Channel Setting Sensing Sensitivity: 0.9 mV

## 2020-08-17 NOTE — Progress Notes (Signed)
Wound check appointment. Dermabond removed. Wound without redness or edema. Incision edges approximated, wound well healed. Normal device function. Thresholds, sensing, and impedances consistent with implant measurements. Device programmed at 3.5V/auto capture programmed on for extra safety margin until 3 month visit. Histogram distribution appropriate for patient and level of activity. No high ventricular rates noted. Patient educated about wound care, arm mobility, lifting restrictions. Patient is enrolled in remote monitoring, next scheduled check 11/01/20.  ROV with Dr. Caryl Comes 11/16/20.

## 2020-08-18 DIAGNOSIS — I2510 Atherosclerotic heart disease of native coronary artery without angina pectoris: Secondary | ICD-10-CM | POA: Diagnosis not present

## 2020-08-18 DIAGNOSIS — I130 Hypertensive heart and chronic kidney disease with heart failure and stage 1 through stage 4 chronic kidney disease, or unspecified chronic kidney disease: Secondary | ICD-10-CM | POA: Diagnosis not present

## 2020-08-18 DIAGNOSIS — M810 Age-related osteoporosis without current pathological fracture: Secondary | ICD-10-CM | POA: Diagnosis not present

## 2020-08-18 DIAGNOSIS — I442 Atrioventricular block, complete: Secondary | ICD-10-CM | POA: Diagnosis not present

## 2020-08-18 DIAGNOSIS — Z48812 Encounter for surgical aftercare following surgery on the circulatory system: Secondary | ICD-10-CM | POA: Diagnosis not present

## 2020-08-18 DIAGNOSIS — N183 Chronic kidney disease, stage 3 unspecified: Secondary | ICD-10-CM | POA: Diagnosis not present

## 2020-08-18 DIAGNOSIS — I4891 Unspecified atrial fibrillation: Secondary | ICD-10-CM | POA: Diagnosis not present

## 2020-08-18 DIAGNOSIS — I5033 Acute on chronic diastolic (congestive) heart failure: Secondary | ICD-10-CM | POA: Diagnosis not present

## 2020-08-18 DIAGNOSIS — K219 Gastro-esophageal reflux disease without esophagitis: Secondary | ICD-10-CM | POA: Diagnosis not present

## 2020-08-18 DIAGNOSIS — E1122 Type 2 diabetes mellitus with diabetic chronic kidney disease: Secondary | ICD-10-CM | POA: Diagnosis not present

## 2020-08-18 DIAGNOSIS — Z950 Presence of cardiac pacemaker: Secondary | ICD-10-CM | POA: Diagnosis not present

## 2020-08-18 DIAGNOSIS — E785 Hyperlipidemia, unspecified: Secondary | ICD-10-CM | POA: Diagnosis not present

## 2020-08-18 DIAGNOSIS — K589 Irritable bowel syndrome without diarrhea: Secondary | ICD-10-CM | POA: Diagnosis not present

## 2020-08-18 DIAGNOSIS — Z7951 Long term (current) use of inhaled steroids: Secondary | ICD-10-CM | POA: Diagnosis not present

## 2020-08-18 DIAGNOSIS — J302 Other seasonal allergic rhinitis: Secondary | ICD-10-CM | POA: Diagnosis not present

## 2020-08-18 DIAGNOSIS — K649 Unspecified hemorrhoids: Secondary | ICD-10-CM | POA: Diagnosis not present

## 2020-08-18 DIAGNOSIS — D509 Iron deficiency anemia, unspecified: Secondary | ICD-10-CM | POA: Diagnosis not present

## 2020-08-18 DIAGNOSIS — J45991 Cough variant asthma: Secondary | ICD-10-CM | POA: Diagnosis not present

## 2020-08-18 DIAGNOSIS — E559 Vitamin D deficiency, unspecified: Secondary | ICD-10-CM | POA: Diagnosis not present

## 2020-08-18 DIAGNOSIS — Z794 Long term (current) use of insulin: Secondary | ICD-10-CM | POA: Diagnosis not present

## 2020-08-20 ENCOUNTER — Other Ambulatory Visit: Payer: Self-pay | Admitting: Family

## 2020-08-20 ENCOUNTER — Telehealth: Payer: Self-pay | Admitting: Family

## 2020-08-20 DIAGNOSIS — J302 Other seasonal allergic rhinitis: Secondary | ICD-10-CM | POA: Diagnosis not present

## 2020-08-20 DIAGNOSIS — K589 Irritable bowel syndrome without diarrhea: Secondary | ICD-10-CM | POA: Diagnosis not present

## 2020-08-20 DIAGNOSIS — E785 Hyperlipidemia, unspecified: Secondary | ICD-10-CM | POA: Diagnosis not present

## 2020-08-20 DIAGNOSIS — M810 Age-related osteoporosis without current pathological fracture: Secondary | ICD-10-CM | POA: Diagnosis not present

## 2020-08-20 DIAGNOSIS — K219 Gastro-esophageal reflux disease without esophagitis: Secondary | ICD-10-CM | POA: Diagnosis not present

## 2020-08-20 DIAGNOSIS — K649 Unspecified hemorrhoids: Secondary | ICD-10-CM | POA: Diagnosis not present

## 2020-08-20 DIAGNOSIS — Z48812 Encounter for surgical aftercare following surgery on the circulatory system: Secondary | ICD-10-CM | POA: Diagnosis not present

## 2020-08-20 DIAGNOSIS — I130 Hypertensive heart and chronic kidney disease with heart failure and stage 1 through stage 4 chronic kidney disease, or unspecified chronic kidney disease: Secondary | ICD-10-CM | POA: Diagnosis not present

## 2020-08-20 DIAGNOSIS — I442 Atrioventricular block, complete: Secondary | ICD-10-CM | POA: Diagnosis not present

## 2020-08-20 DIAGNOSIS — N183 Chronic kidney disease, stage 3 unspecified: Secondary | ICD-10-CM | POA: Diagnosis not present

## 2020-08-20 DIAGNOSIS — Z950 Presence of cardiac pacemaker: Secondary | ICD-10-CM | POA: Diagnosis not present

## 2020-08-20 DIAGNOSIS — J45991 Cough variant asthma: Secondary | ICD-10-CM | POA: Diagnosis not present

## 2020-08-20 DIAGNOSIS — I5033 Acute on chronic diastolic (congestive) heart failure: Secondary | ICD-10-CM | POA: Diagnosis not present

## 2020-08-20 DIAGNOSIS — E1122 Type 2 diabetes mellitus with diabetic chronic kidney disease: Secondary | ICD-10-CM | POA: Diagnosis not present

## 2020-08-20 DIAGNOSIS — I2510 Atherosclerotic heart disease of native coronary artery without angina pectoris: Secondary | ICD-10-CM | POA: Diagnosis not present

## 2020-08-20 DIAGNOSIS — Z7951 Long term (current) use of inhaled steroids: Secondary | ICD-10-CM | POA: Diagnosis not present

## 2020-08-20 DIAGNOSIS — E559 Vitamin D deficiency, unspecified: Secondary | ICD-10-CM | POA: Diagnosis not present

## 2020-08-20 DIAGNOSIS — D509 Iron deficiency anemia, unspecified: Secondary | ICD-10-CM | POA: Diagnosis not present

## 2020-08-20 DIAGNOSIS — I4891 Unspecified atrial fibrillation: Secondary | ICD-10-CM | POA: Diagnosis not present

## 2020-08-20 DIAGNOSIS — Z794 Long term (current) use of insulin: Secondary | ICD-10-CM | POA: Diagnosis not present

## 2020-08-20 NOTE — Telephone Encounter (Addendum)
Please tell Cecille Rubin we can give them an order for the shower chair; Okay to give verbal order for shower chair as requested;  They need to contact her cardiologist regarding the Hydralazine; that office is managing that prescription for her.

## 2020-08-20 NOTE — Telephone Encounter (Signed)
Cecille Rubin from [facility] called  [phone]  Needs a Verbal Order for shower chair   Also needs qualification for   hydrALAZINE (APRESOLINE) 25 MG tablet  Patient has been taking 75 MG 3 times a day since being discharged from hospital on 08/03/20.

## 2020-08-20 NOTE — Telephone Encounter (Signed)
Detailed massage left on Lori's voicemail.

## 2020-08-23 DIAGNOSIS — I4891 Unspecified atrial fibrillation: Secondary | ICD-10-CM | POA: Diagnosis not present

## 2020-08-23 DIAGNOSIS — Z794 Long term (current) use of insulin: Secondary | ICD-10-CM | POA: Diagnosis not present

## 2020-08-23 DIAGNOSIS — J45991 Cough variant asthma: Secondary | ICD-10-CM | POA: Diagnosis not present

## 2020-08-23 DIAGNOSIS — D509 Iron deficiency anemia, unspecified: Secondary | ICD-10-CM | POA: Diagnosis not present

## 2020-08-23 DIAGNOSIS — E785 Hyperlipidemia, unspecified: Secondary | ICD-10-CM | POA: Diagnosis not present

## 2020-08-23 DIAGNOSIS — I5033 Acute on chronic diastolic (congestive) heart failure: Secondary | ICD-10-CM | POA: Diagnosis not present

## 2020-08-23 DIAGNOSIS — K219 Gastro-esophageal reflux disease without esophagitis: Secondary | ICD-10-CM | POA: Diagnosis not present

## 2020-08-23 DIAGNOSIS — Z950 Presence of cardiac pacemaker: Secondary | ICD-10-CM | POA: Diagnosis not present

## 2020-08-23 DIAGNOSIS — N183 Chronic kidney disease, stage 3 unspecified: Secondary | ICD-10-CM | POA: Diagnosis not present

## 2020-08-23 DIAGNOSIS — K589 Irritable bowel syndrome without diarrhea: Secondary | ICD-10-CM | POA: Diagnosis not present

## 2020-08-23 DIAGNOSIS — E1122 Type 2 diabetes mellitus with diabetic chronic kidney disease: Secondary | ICD-10-CM | POA: Diagnosis not present

## 2020-08-23 DIAGNOSIS — Z7951 Long term (current) use of inhaled steroids: Secondary | ICD-10-CM | POA: Diagnosis not present

## 2020-08-23 DIAGNOSIS — J302 Other seasonal allergic rhinitis: Secondary | ICD-10-CM | POA: Diagnosis not present

## 2020-08-23 DIAGNOSIS — Z48812 Encounter for surgical aftercare following surgery on the circulatory system: Secondary | ICD-10-CM | POA: Diagnosis not present

## 2020-08-23 DIAGNOSIS — M810 Age-related osteoporosis without current pathological fracture: Secondary | ICD-10-CM | POA: Diagnosis not present

## 2020-08-23 DIAGNOSIS — E559 Vitamin D deficiency, unspecified: Secondary | ICD-10-CM | POA: Diagnosis not present

## 2020-08-23 DIAGNOSIS — K649 Unspecified hemorrhoids: Secondary | ICD-10-CM | POA: Diagnosis not present

## 2020-08-23 DIAGNOSIS — I2510 Atherosclerotic heart disease of native coronary artery without angina pectoris: Secondary | ICD-10-CM | POA: Diagnosis not present

## 2020-08-23 DIAGNOSIS — I130 Hypertensive heart and chronic kidney disease with heart failure and stage 1 through stage 4 chronic kidney disease, or unspecified chronic kidney disease: Secondary | ICD-10-CM | POA: Diagnosis not present

## 2020-08-23 DIAGNOSIS — I442 Atrioventricular block, complete: Secondary | ICD-10-CM | POA: Diagnosis not present

## 2020-08-25 DIAGNOSIS — I2510 Atherosclerotic heart disease of native coronary artery without angina pectoris: Secondary | ICD-10-CM | POA: Diagnosis not present

## 2020-08-25 DIAGNOSIS — Z7951 Long term (current) use of inhaled steroids: Secondary | ICD-10-CM | POA: Diagnosis not present

## 2020-08-25 DIAGNOSIS — I5033 Acute on chronic diastolic (congestive) heart failure: Secondary | ICD-10-CM | POA: Diagnosis not present

## 2020-08-25 DIAGNOSIS — K649 Unspecified hemorrhoids: Secondary | ICD-10-CM | POA: Diagnosis not present

## 2020-08-25 DIAGNOSIS — Z950 Presence of cardiac pacemaker: Secondary | ICD-10-CM | POA: Diagnosis not present

## 2020-08-25 DIAGNOSIS — Z48812 Encounter for surgical aftercare following surgery on the circulatory system: Secondary | ICD-10-CM | POA: Diagnosis not present

## 2020-08-25 DIAGNOSIS — E1122 Type 2 diabetes mellitus with diabetic chronic kidney disease: Secondary | ICD-10-CM | POA: Diagnosis not present

## 2020-08-25 DIAGNOSIS — K589 Irritable bowel syndrome without diarrhea: Secondary | ICD-10-CM | POA: Diagnosis not present

## 2020-08-25 DIAGNOSIS — I130 Hypertensive heart and chronic kidney disease with heart failure and stage 1 through stage 4 chronic kidney disease, or unspecified chronic kidney disease: Secondary | ICD-10-CM | POA: Diagnosis not present

## 2020-08-25 DIAGNOSIS — E559 Vitamin D deficiency, unspecified: Secondary | ICD-10-CM | POA: Diagnosis not present

## 2020-08-25 DIAGNOSIS — I442 Atrioventricular block, complete: Secondary | ICD-10-CM | POA: Diagnosis not present

## 2020-08-25 DIAGNOSIS — E785 Hyperlipidemia, unspecified: Secondary | ICD-10-CM | POA: Diagnosis not present

## 2020-08-25 DIAGNOSIS — J45991 Cough variant asthma: Secondary | ICD-10-CM | POA: Diagnosis not present

## 2020-08-25 DIAGNOSIS — Z794 Long term (current) use of insulin: Secondary | ICD-10-CM | POA: Diagnosis not present

## 2020-08-25 DIAGNOSIS — K219 Gastro-esophageal reflux disease without esophagitis: Secondary | ICD-10-CM | POA: Diagnosis not present

## 2020-08-25 DIAGNOSIS — I4891 Unspecified atrial fibrillation: Secondary | ICD-10-CM | POA: Diagnosis not present

## 2020-08-25 DIAGNOSIS — M810 Age-related osteoporosis without current pathological fracture: Secondary | ICD-10-CM | POA: Diagnosis not present

## 2020-08-25 DIAGNOSIS — D509 Iron deficiency anemia, unspecified: Secondary | ICD-10-CM | POA: Diagnosis not present

## 2020-08-25 DIAGNOSIS — J302 Other seasonal allergic rhinitis: Secondary | ICD-10-CM | POA: Diagnosis not present

## 2020-08-25 DIAGNOSIS — N183 Chronic kidney disease, stage 3 unspecified: Secondary | ICD-10-CM | POA: Diagnosis not present

## 2020-08-29 NOTE — Progress Notes (Signed)
Cardiology Office Note   Date:  08/30/2020   ID:  [name], DOB 31-Mar-1936, MRN [id number]  PCP:  [name], FNP  Cardiologist:   Dorris Carnes, MD   F/U of PAF, CAD and HTN     History of Present Illness: [name] is a 84 y.o. female with a history of HTN, DM, coronary artery calcifications and hot flashes  SHe also had an epsode in ED of atrial fib  CHADSVASc of 5      Myovue showed evidence of m I recomm CT scan  This showed Ca score of 51  Modrate CAD of LAD   FFR done  Did not appear flow limiting     Small nodule seen on CT on R middle lobe    I saw the pt in Fall 2020  She was last seen in cardiology clinic by Kathleen Argue in May 2021 The pt was recently discharged from [facility] with dyspnea   Found to be bradycardic with complete heart block   She underwent PPM placement   SInce d/c she is feeling much better   Breathing is better   No CP   She did not restart Eliquis  Current Meds  Medication Sig  . acetaminophen (TYLENOL) 325 MG tablet Take 650 mg by mouth every 6 (six) hours as needed for pain.  Marland Kitchen albuterol (PROAIR HFA) 108 (90 Base) MCG/ACT inhaler Inhale 2 puffs into the lungs every 4 (four) hours as needed for wheezing.  Marland Kitchen amLODipine (NORVASC) 5 MG tablet TAKE 1 TABLET BY MOUTH TWICE A DAY  . Blood Glucose Monitoring Suppl (ONE TOUCH ULTRA 2) W/DEVICE KIT Check blood sugar 3 times per day.  . budesonide-formoterol (SYMBICORT) 160-4.5 MCG/ACT inhaler TAKE 2 PUFFS FIRST THING IN AM AND THEN ANOTHER 2 PUFFS ABOUT 12 HOURS LATER.  . cetirizine (ZYRTEC ALLERGY) 10 MG tablet Take 1 tablet (10 mg total) by mouth daily as needed for allergies.  . furosemide (LASIX) 40 MG tablet Take 1 tablet (40 mg total) by mouth daily.  . hydrALAZINE (APRESOLINE) 25 MG tablet Take 3 tablets (75 mg total) by mouth 3 (three) times daily.  . insulin lispro (HUMALOG) 100 UNIT/ML injection 5 UNITS WITH BREAKFAST, AND 25 UNITS WITH SUPPER (Patient taking differently: Inject 5-25  Units into the skin See admin instructions. 5 UNITS WITH BREAKFAST, AND 25 UNITS WITH SUPPER)  . montelukast (SINGULAIR) 10 MG tablet TAKE 1 TABLET BY MOUTH AT BEDTIME (Patient taking differently: Take 10 mg by mouth at bedtime. )  . omeprazole (PRILOSEC) 40 MG capsule Take 1 capsule (40 mg total) by mouth daily.  . potassium chloride (KLOR-CON) 10 MEQ tablet TAKE 1 TABLET BY MOUTH EVERY DAY  . pravastatin (PRAVACHOL) 40 MG tablet TAKE 1 TABLET BY MOUTH EVERY DAY        Allergies:   Aspirin and Ramipril   Past Medical History:  Diagnosis Date  . Allergic rhinitis   . Anterior chest wall pain   . Anxiety   . Asthma   . Chronic diastolic CHF (congestive heart failure) (HCC)    Echo 01/2020: EF 60-65, normal wall motion, mild LVH, normal RV SF, RVSP 52.6 (moderate elevation), severe LAE, moderate RAE, trivial MR, mild MS (mean gradient 5.5 mmHg), mild aortic valve sclerosis (no AS); elevated E/e' c/w elevated LVEDP  . Cough   . DM type 2 (diabetes mellitus, type 2) ([place])   . GERD (gastroesophageal reflux disease)   . History of diverticulitis  of colon   . HTN (hypertension)   . Hyperlipidemia   . IBS (irritable bowel syndrome)   . Iron deficiency anemia   . Osteoporosis, unspecified   . Renal insufficiency 07/31/2017  . UTI (urinary tract infection)     Past Surgical History:  Procedure Laterality Date  . CARDIOVASCULAR STRESS TEST  02/25/04  . ESOPHAGOGASTRODUODENOSCOPY  12/26/01  . PACEMAKER IMPLANT N/A 08/02/2020   Procedure: PACEMAKER IMPLANT;  Surgeon: Deboraha Sprang, MD;  Location: [place] [facility];  Service: Cardiovascular;  Laterality: N/A;  . RIGHT OOPHORECTOMY  jan 2010     Social History:  The patient  reports that she quit smoking about 33 years ago. Her smoking use included cigarettes. She has a 1.50 pack-year smoking history. She has never used smokeless tobacco. She reports that she does not drink alcohol and does not use drugs.   Family History:  The patient's  family history includes Hypertension in her mother; Lung cancer in her brother; Melanoma in her brother; Other in her father; Stroke in her mother.    ROS:  Please see the history of present illness. All other systems are reviewed and  Negative to the above problem except as noted.    PHYSICAL EXAM: VS:  BP (!) 144/72   Pulse 81   Ht _0  (1.702 m)   Wt 178 lb 6.4 oz (80.9 kg)   SpO2 98%   BMI 27.94 kg/m   GEN: Obese 84 yo  in no acute distress  HEENT: normal  Neck: JVP normal   Chest   Pacer site with minimal swelling    Healing well   Cardiac: Irregularly irregular; I-II/VI systolic murmur at LSB   NO LE edema  Respiratory:  clear to auscultation bilaterally, GI: soft, nontender, nondistended, + BS  No hepatomegaly  MS: no deformity Moving all extremities   Skin: warm and dry, no rash Neuro:  Strength and sensation are intact Psych: euthymic mood, full affect   EKG:  EKG is not ordered today   Lipid Panel    Component Value Date/Time   CHOL 104 08/27/2019 1120   TRIG 59.0 08/27/2019 1120   TRIG 83 08/07/2006 1419   HDL 44.90 08/27/2019 1120   CHOLHDL 2 08/27/2019 1120   VLDL 11.8 08/27/2019 1120   LDLCALC 47 08/27/2019 1120      Wt Readings from Last 3 Encounters:  08/30/20 178 lb 6.4 oz (80.9 kg)  08/03/20 178 lb 3.2 oz (80.8 kg)  07/29/20 188 lb (85.3 kg)      ASSESSMENT AND PLAN:  1  Bradycardia/ CHB   Pt feels much better now with pacer in place   Will have f/u with [facility] this winter   1  Atrial fib   Resume Eliquis    2  HTN BP is fair   Follow for now   3  HL  Cont pravastatin last lipid panel was excellent continue  2.  CAD No symptoms of angina   Disposition:   FU with  in early August.  Signed, Dorris Carnes, MD  08/30/2020 10:45 PM    [address], [place], [place]  [address] Phone: [phone]; Fax: [phone]

## 2020-08-30 ENCOUNTER — Ambulatory Visit: Payer: Medicare Other | Admitting: Internal Medicine

## 2020-08-30 ENCOUNTER — Encounter: Payer: Self-pay | Admitting: Internal Medicine

## 2020-08-30 ENCOUNTER — Other Ambulatory Visit: Payer: Self-pay

## 2020-08-30 VITALS — BP 144/72 | HR 81 | Ht 67.0 in | Wt 178.4 lb

## 2020-08-30 DIAGNOSIS — K219 Gastro-esophageal reflux disease without esophagitis: Secondary | ICD-10-CM | POA: Diagnosis not present

## 2020-08-30 DIAGNOSIS — I4819 Other persistent atrial fibrillation: Secondary | ICD-10-CM | POA: Diagnosis not present

## 2020-08-30 DIAGNOSIS — D509 Iron deficiency anemia, unspecified: Secondary | ICD-10-CM | POA: Diagnosis not present

## 2020-08-30 DIAGNOSIS — I442 Atrioventricular block, complete: Secondary | ICD-10-CM | POA: Diagnosis not present

## 2020-08-30 DIAGNOSIS — I5033 Acute on chronic diastolic (congestive) heart failure: Secondary | ICD-10-CM | POA: Diagnosis not present

## 2020-08-30 DIAGNOSIS — N183 Chronic kidney disease, stage 3 unspecified: Secondary | ICD-10-CM | POA: Diagnosis not present

## 2020-08-30 DIAGNOSIS — Z7951 Long term (current) use of inhaled steroids: Secondary | ICD-10-CM | POA: Diagnosis not present

## 2020-08-30 DIAGNOSIS — I130 Hypertensive heart and chronic kidney disease with heart failure and stage 1 through stage 4 chronic kidney disease, or unspecified chronic kidney disease: Secondary | ICD-10-CM | POA: Diagnosis not present

## 2020-08-30 DIAGNOSIS — K589 Irritable bowel syndrome without diarrhea: Secondary | ICD-10-CM | POA: Diagnosis not present

## 2020-08-30 DIAGNOSIS — E1122 Type 2 diabetes mellitus with diabetic chronic kidney disease: Secondary | ICD-10-CM | POA: Diagnosis not present

## 2020-08-30 DIAGNOSIS — J302 Other seasonal allergic rhinitis: Secondary | ICD-10-CM | POA: Diagnosis not present

## 2020-08-30 DIAGNOSIS — Z794 Long term (current) use of insulin: Secondary | ICD-10-CM | POA: Diagnosis not present

## 2020-08-30 DIAGNOSIS — Z950 Presence of cardiac pacemaker: Secondary | ICD-10-CM

## 2020-08-30 DIAGNOSIS — E559 Vitamin D deficiency, unspecified: Secondary | ICD-10-CM | POA: Diagnosis not present

## 2020-08-30 DIAGNOSIS — K649 Unspecified hemorrhoids: Secondary | ICD-10-CM | POA: Diagnosis not present

## 2020-08-30 DIAGNOSIS — I4891 Unspecified atrial fibrillation: Secondary | ICD-10-CM | POA: Diagnosis not present

## 2020-08-30 DIAGNOSIS — I2510 Atherosclerotic heart disease of native coronary artery without angina pectoris: Secondary | ICD-10-CM | POA: Diagnosis not present

## 2020-08-30 DIAGNOSIS — M810 Age-related osteoporosis without current pathological fracture: Secondary | ICD-10-CM | POA: Diagnosis not present

## 2020-08-30 DIAGNOSIS — J45991 Cough variant asthma: Secondary | ICD-10-CM | POA: Diagnosis not present

## 2020-08-30 DIAGNOSIS — Z48812 Encounter for surgical aftercare following surgery on the circulatory system: Secondary | ICD-10-CM | POA: Diagnosis not present

## 2020-08-30 DIAGNOSIS — E785 Hyperlipidemia, unspecified: Secondary | ICD-10-CM | POA: Diagnosis not present

## 2020-08-30 MED ORDER — APIXABAN 2.5 MG PO TABS: 2.5000 mg | ORAL_TABLET | Freq: Two times a day (BID) | ORAL | 6 refills | Status: DC

## 2020-08-30 NOTE — Patient Instructions (Addendum)
Medication Instructions:  Your physician has recommended you make the following change in your medication:  1.) restart apixaban (Eliquis) 2.5 mg  - one tablet by mouth twice a day  *If you need a refill on your cardiac medications before your next appointment, please call your pharmacy*   Lab Work: none If you have labs (blood work) drawn today and your tests are completely normal, you will receive your results only by: Marland Kitchen MyChart Message (if you have MyChart) OR . A paper copy in the mail If you have any lab test that is abnormal or we need to change your treatment, we will call you to review the results.   Testing/Procedures: none   Follow-Up: At [facility], you and your health needs are our priority.  As part of our continuing mission to provide you with exceptional heart care, we have created designated Provider Care Teams.  These Care Teams include your primary Cardiologist (physician) and Advanced Practice Providers (APPs -  Physician Assistants and Nurse Practitioners) who all work together to provide you with the care you need, when you need it.  We recommend signing up for the patient portal called "MyChart".  Sign up information is provided on this After Visit Summary.  MyChart is used to connect with patients for Virtual Visits (Telemedicine).  Patients are able to view lab/test results, encounter notes, upcoming appointments, etc.  Non-urgent messages can be sent to your provider as well.   To learn more about what you can do with MyChart, go to [email or web address].    Your next appointment:   9 month(s)  The format for your next appointment:   In Person  Provider:   You may see Dorris Carnes, MD or one of the following Advanced Practice Providers on your designated Care Team:    Richardson Dopp, PA-C  Robbie Lis, [place]   Other Instructions

## 2020-08-31 ENCOUNTER — Telehealth: Payer: Self-pay | Admitting: Family

## 2020-08-31 NOTE — Telephone Encounter (Signed)
Called patient no answer and leaving a voicemail was not an option no remote access code.

## 2020-08-31 NOTE — Telephone Encounter (Signed)
For Medicare purposes and to get her Ovid covered, we have to have an OV on file within 3 months of request for [facility] orders. Please ask her to schedule virtual visit so we can get appropriate documentation to complete the [facility] orders.

## 2020-09-02 ENCOUNTER — Other Ambulatory Visit: Payer: Self-pay | Admitting: Endocrinology

## 2020-09-02 DIAGNOSIS — Z794 Long term (current) use of insulin: Secondary | ICD-10-CM

## 2020-09-02 DIAGNOSIS — E1122 Type 2 diabetes mellitus with diabetic chronic kidney disease: Secondary | ICD-10-CM

## 2020-09-06 DIAGNOSIS — Z48812 Encounter for surgical aftercare following surgery on the circulatory system: Secondary | ICD-10-CM | POA: Diagnosis not present

## 2020-09-06 DIAGNOSIS — I4891 Unspecified atrial fibrillation: Secondary | ICD-10-CM | POA: Diagnosis not present

## 2020-09-06 DIAGNOSIS — K219 Gastro-esophageal reflux disease without esophagitis: Secondary | ICD-10-CM | POA: Diagnosis not present

## 2020-09-06 DIAGNOSIS — Z7951 Long term (current) use of inhaled steroids: Secondary | ICD-10-CM | POA: Diagnosis not present

## 2020-09-06 DIAGNOSIS — J45991 Cough variant asthma: Secondary | ICD-10-CM | POA: Diagnosis not present

## 2020-09-06 DIAGNOSIS — M810 Age-related osteoporosis without current pathological fracture: Secondary | ICD-10-CM | POA: Diagnosis not present

## 2020-09-06 DIAGNOSIS — I130 Hypertensive heart and chronic kidney disease with heart failure and stage 1 through stage 4 chronic kidney disease, or unspecified chronic kidney disease: Secondary | ICD-10-CM | POA: Diagnosis not present

## 2020-09-06 DIAGNOSIS — Z794 Long term (current) use of insulin: Secondary | ICD-10-CM | POA: Diagnosis not present

## 2020-09-06 DIAGNOSIS — E559 Vitamin D deficiency, unspecified: Secondary | ICD-10-CM | POA: Diagnosis not present

## 2020-09-06 DIAGNOSIS — J302 Other seasonal allergic rhinitis: Secondary | ICD-10-CM | POA: Diagnosis not present

## 2020-09-06 DIAGNOSIS — K649 Unspecified hemorrhoids: Secondary | ICD-10-CM | POA: Diagnosis not present

## 2020-09-06 DIAGNOSIS — Z950 Presence of cardiac pacemaker: Secondary | ICD-10-CM | POA: Diagnosis not present

## 2020-09-06 DIAGNOSIS — E1122 Type 2 diabetes mellitus with diabetic chronic kidney disease: Secondary | ICD-10-CM | POA: Diagnosis not present

## 2020-09-06 DIAGNOSIS — I2510 Atherosclerotic heart disease of native coronary artery without angina pectoris: Secondary | ICD-10-CM | POA: Diagnosis not present

## 2020-09-06 DIAGNOSIS — I442 Atrioventricular block, complete: Secondary | ICD-10-CM | POA: Diagnosis not present

## 2020-09-06 DIAGNOSIS — I5033 Acute on chronic diastolic (congestive) heart failure: Secondary | ICD-10-CM | POA: Diagnosis not present

## 2020-09-06 DIAGNOSIS — D509 Iron deficiency anemia, unspecified: Secondary | ICD-10-CM | POA: Diagnosis not present

## 2020-09-06 DIAGNOSIS — K589 Irritable bowel syndrome without diarrhea: Secondary | ICD-10-CM | POA: Diagnosis not present

## 2020-09-06 DIAGNOSIS — N183 Chronic kidney disease, stage 3 unspecified: Secondary | ICD-10-CM | POA: Diagnosis not present

## 2020-09-06 DIAGNOSIS — E785 Hyperlipidemia, unspecified: Secondary | ICD-10-CM | POA: Diagnosis not present

## 2020-09-08 DIAGNOSIS — I2510 Atherosclerotic heart disease of native coronary artery without angina pectoris: Secondary | ICD-10-CM | POA: Diagnosis not present

## 2020-09-08 DIAGNOSIS — K219 Gastro-esophageal reflux disease without esophagitis: Secondary | ICD-10-CM | POA: Diagnosis not present

## 2020-09-08 DIAGNOSIS — E559 Vitamin D deficiency, unspecified: Secondary | ICD-10-CM | POA: Diagnosis not present

## 2020-09-08 DIAGNOSIS — I5033 Acute on chronic diastolic (congestive) heart failure: Secondary | ICD-10-CM | POA: Diagnosis not present

## 2020-09-08 DIAGNOSIS — N183 Chronic kidney disease, stage 3 unspecified: Secondary | ICD-10-CM | POA: Diagnosis not present

## 2020-09-08 DIAGNOSIS — J302 Other seasonal allergic rhinitis: Secondary | ICD-10-CM | POA: Diagnosis not present

## 2020-09-08 DIAGNOSIS — I130 Hypertensive heart and chronic kidney disease with heart failure and stage 1 through stage 4 chronic kidney disease, or unspecified chronic kidney disease: Secondary | ICD-10-CM | POA: Diagnosis not present

## 2020-09-08 DIAGNOSIS — D509 Iron deficiency anemia, unspecified: Secondary | ICD-10-CM | POA: Diagnosis not present

## 2020-09-08 DIAGNOSIS — Z48812 Encounter for surgical aftercare following surgery on the circulatory system: Secondary | ICD-10-CM | POA: Diagnosis not present

## 2020-09-08 DIAGNOSIS — I4891 Unspecified atrial fibrillation: Secondary | ICD-10-CM | POA: Diagnosis not present

## 2020-09-08 DIAGNOSIS — K649 Unspecified hemorrhoids: Secondary | ICD-10-CM | POA: Diagnosis not present

## 2020-09-08 DIAGNOSIS — J45991 Cough variant asthma: Secondary | ICD-10-CM | POA: Diagnosis not present

## 2020-09-08 DIAGNOSIS — I442 Atrioventricular block, complete: Secondary | ICD-10-CM | POA: Diagnosis not present

## 2020-09-08 DIAGNOSIS — Z7951 Long term (current) use of inhaled steroids: Secondary | ICD-10-CM | POA: Diagnosis not present

## 2020-09-08 DIAGNOSIS — E785 Hyperlipidemia, unspecified: Secondary | ICD-10-CM | POA: Diagnosis not present

## 2020-09-08 DIAGNOSIS — K589 Irritable bowel syndrome without diarrhea: Secondary | ICD-10-CM | POA: Diagnosis not present

## 2020-09-08 DIAGNOSIS — Z794 Long term (current) use of insulin: Secondary | ICD-10-CM | POA: Diagnosis not present

## 2020-09-08 DIAGNOSIS — E1122 Type 2 diabetes mellitus with diabetic chronic kidney disease: Secondary | ICD-10-CM | POA: Diagnosis not present

## 2020-09-08 DIAGNOSIS — Z950 Presence of cardiac pacemaker: Secondary | ICD-10-CM | POA: Diagnosis not present

## 2020-09-08 DIAGNOSIS — M810 Age-related osteoporosis without current pathological fracture: Secondary | ICD-10-CM | POA: Diagnosis not present

## 2020-09-10 DIAGNOSIS — Z794 Long term (current) use of insulin: Secondary | ICD-10-CM | POA: Diagnosis not present

## 2020-09-10 DIAGNOSIS — E119 Type 2 diabetes mellitus without complications: Secondary | ICD-10-CM | POA: Diagnosis not present

## 2020-09-10 DIAGNOSIS — H40013 Open angle with borderline findings, low risk, bilateral: Secondary | ICD-10-CM | POA: Diagnosis not present

## 2020-09-10 DIAGNOSIS — Z961 Presence of intraocular lens: Secondary | ICD-10-CM | POA: Diagnosis not present

## 2020-09-14 DIAGNOSIS — Z48812 Encounter for surgical aftercare following surgery on the circulatory system: Secondary | ICD-10-CM | POA: Diagnosis not present

## 2020-09-14 DIAGNOSIS — D509 Iron deficiency anemia, unspecified: Secondary | ICD-10-CM | POA: Diagnosis not present

## 2020-09-14 DIAGNOSIS — Z950 Presence of cardiac pacemaker: Secondary | ICD-10-CM | POA: Diagnosis not present

## 2020-09-14 DIAGNOSIS — I5033 Acute on chronic diastolic (congestive) heart failure: Secondary | ICD-10-CM | POA: Diagnosis not present

## 2020-09-14 DIAGNOSIS — E1122 Type 2 diabetes mellitus with diabetic chronic kidney disease: Secondary | ICD-10-CM | POA: Diagnosis not present

## 2020-09-14 DIAGNOSIS — Z794 Long term (current) use of insulin: Secondary | ICD-10-CM | POA: Diagnosis not present

## 2020-09-14 DIAGNOSIS — J45991 Cough variant asthma: Secondary | ICD-10-CM | POA: Diagnosis not present

## 2020-09-14 DIAGNOSIS — K219 Gastro-esophageal reflux disease without esophagitis: Secondary | ICD-10-CM | POA: Diagnosis not present

## 2020-09-14 DIAGNOSIS — E559 Vitamin D deficiency, unspecified: Secondary | ICD-10-CM | POA: Diagnosis not present

## 2020-09-14 DIAGNOSIS — K649 Unspecified hemorrhoids: Secondary | ICD-10-CM | POA: Diagnosis not present

## 2020-09-14 DIAGNOSIS — J302 Other seasonal allergic rhinitis: Secondary | ICD-10-CM | POA: Diagnosis not present

## 2020-09-14 DIAGNOSIS — M810 Age-related osteoporosis without current pathological fracture: Secondary | ICD-10-CM | POA: Diagnosis not present

## 2020-09-14 DIAGNOSIS — N183 Chronic kidney disease, stage 3 unspecified: Secondary | ICD-10-CM | POA: Diagnosis not present

## 2020-09-14 DIAGNOSIS — I4891 Unspecified atrial fibrillation: Secondary | ICD-10-CM | POA: Diagnosis not present

## 2020-09-14 DIAGNOSIS — I442 Atrioventricular block, complete: Secondary | ICD-10-CM | POA: Diagnosis not present

## 2020-09-14 DIAGNOSIS — I130 Hypertensive heart and chronic kidney disease with heart failure and stage 1 through stage 4 chronic kidney disease, or unspecified chronic kidney disease: Secondary | ICD-10-CM | POA: Diagnosis not present

## 2020-09-14 DIAGNOSIS — E785 Hyperlipidemia, unspecified: Secondary | ICD-10-CM | POA: Diagnosis not present

## 2020-09-14 DIAGNOSIS — Z7951 Long term (current) use of inhaled steroids: Secondary | ICD-10-CM | POA: Diagnosis not present

## 2020-09-14 DIAGNOSIS — I2510 Atherosclerotic heart disease of native coronary artery without angina pectoris: Secondary | ICD-10-CM | POA: Diagnosis not present

## 2020-09-14 DIAGNOSIS — K589 Irritable bowel syndrome without diarrhea: Secondary | ICD-10-CM | POA: Diagnosis not present

## 2020-09-17 ENCOUNTER — Telehealth: Payer: Self-pay

## 2020-09-17 NOTE — Telephone Encounter (Signed)
Notified Crystal Richmond at [facility] [phone] in regards to orders that can not be sign due to patient not being seen in 90 days and missed follow up appointment.

## 2020-09-21 ENCOUNTER — Ambulatory Visit (INDEPENDENT_AMBULATORY_CARE_PROVIDER_SITE_OTHER): Payer: Medicare Other | Admitting: Family

## 2020-09-21 ENCOUNTER — Encounter: Payer: Self-pay | Admitting: Family

## 2020-09-21 ENCOUNTER — Other Ambulatory Visit: Payer: Self-pay

## 2020-09-21 VITALS — BP 146/70 | HR 74 | Temp 98.2°F | Ht 67.0 in | Wt 176.0 lb

## 2020-09-21 DIAGNOSIS — N183 Chronic kidney disease, stage 3 unspecified: Secondary | ICD-10-CM | POA: Diagnosis not present

## 2020-09-21 DIAGNOSIS — E1122 Type 2 diabetes mellitus with diabetic chronic kidney disease: Secondary | ICD-10-CM | POA: Diagnosis not present

## 2020-09-21 DIAGNOSIS — I5032 Chronic diastolic (congestive) heart failure: Secondary | ICD-10-CM

## 2020-09-21 DIAGNOSIS — Z794 Long term (current) use of insulin: Secondary | ICD-10-CM | POA: Diagnosis not present

## 2020-09-21 NOTE — Progress Notes (Signed)
[name] is a 84 y.o. female with the following history as recorded in EpicCare:  Patient Active Problem List   Diagnosis Date Noted  . SOB (shortness of breath) 07/29/2020  . Pain in right knee 03/17/2020  . Chronic diastolic CHF (congestive heart failure) ([place])   . DKA (diabetic ketoacidoses) 12/20/2019  . Diverticulosis 12/10/2019  . Shortness of breath 01/30/2018  . Acute bronchiolitis 12/17/2017  . Wheezing 10/29/2017  . Vitamin D deficiency 07/31/2017  . Renal insufficiency 07/31/2017  . Knee pain 06/01/2017  . Morbid obesity due to excess calories ([place]) 01/25/2017  . Diabetes ([place]) 04/12/2016  . Cough 01/20/2015  . Atrial fibrillation ([place]) 05/28/2014  . Personal history of colonic polyps 05/28/2014  . Vomiting 01/29/2014  . CAD (coronary artery disease) 09/01/2013  . Routine general medical examination at a health care facility 07/17/2011  . Encounter for long-term (current) use of other medications 07/06/2011  . Nonspecific (abnormal) findings on radiological and other examination of body structure 11/09/2009  . IRRITABLE BOWEL SYNDROME 05/07/2009  . CHEST PAIN 05/07/2009  . ADNEXAL MASS, RIGHT 07/22/2008  . HEMORRHOIDS, RECURRENT 01/27/2008  . Diverticulitis 01/27/2008  . OTH ABNORMAL FIND RAD EXAMINATION BREAST 01/27/2008  . GERD 01/13/2008  . UTI 09/28/2007  . Dyslipidemia 05/27/2007  . ANEMIA-IRON DEFICIENCY 05/27/2007  . ANXIETY 05/27/2007  . Essential hypertension 05/27/2007  . Seasonal allergic rhinitis 05/27/2007  . Cough variant asthma 05/27/2007  . Osteoporosis 05/27/2007  . DIVERTICULITIS, HX OF 05/27/2007    Current Outpatient Medications  Medication Sig Dispense Refill  . acetaminophen (TYLENOL) 325 MG tablet Take 650 mg by mouth every 6 (six) hours as needed for pain.    Marland Kitchen amLODipine (NORVASC) 5 MG tablet TAKE 1 TABLET BY MOUTH TWICE A DAY 180 tablet 2  . apixaban (ELIQUIS) 2.5 MG TABS tablet Take 1 tablet (2.5 mg total) by mouth 2 (two) times  daily. 60 tablet 6  . Blood Glucose Monitoring Suppl (ONE TOUCH ULTRA 2) W/DEVICE KIT Check blood sugar 3 times per day. 1 each 0  . budesonide-formoterol (SYMBICORT) 160-4.5 MCG/ACT inhaler TAKE 2 PUFFS FIRST THING IN AM AND THEN ANOTHER 2 PUFFS ABOUT 12 HOURS LATER. 30.6 Inhaler 5  . cetirizine (ZYRTEC ALLERGY) 10 MG tablet Take 1 tablet (10 mg total) by mouth daily as needed for allergies.    . hydrALAZINE (APRESOLINE) 25 MG tablet Take 3 tablets (75 mg total) by mouth 3 (three) times daily. 90 tablet 0  . insulin lispro (HUMALOG) 100 UNIT/ML injection INJECT 5 UNITS WITH BREAKFAST, AND 25 UNITS WITH SUPPER (NEEDS APPOINTMENT) 20 mL 0  . montelukast (SINGULAIR) 10 MG tablet TAKE 1 TABLET BY MOUTH AT BEDTIME (Patient taking differently: Take 10 mg by mouth at bedtime.) 90 tablet 5  . omeprazole (PRILOSEC) 40 MG capsule Take 1 capsule (40 mg total) by mouth daily. 30 capsule 11  . potassium chloride (KLOR-CON) 10 MEQ tablet TAKE 1 TABLET BY MOUTH EVERY DAY 90 tablet 2  . pravastatin (PRAVACHOL) 40 MG tablet TAKE 1 TABLET BY MOUTH EVERY DAY 90 tablet 3  . albuterol (PROAIR HFA) 108 (90 Base) MCG/ACT inhaler Inhale 2 puffs into the lungs every 4 (four) hours as needed for wheezing. 18 g 11  . furosemide (LASIX) 40 MG tablet Take 1 tablet (40 mg total) by mouth daily. 90 tablet 3   No current facility-administered medications for this visit.    Allergies: Aspirin and Ramipril  Past Medical History:  Diagnosis Date  . Allergic rhinitis   .  Anterior chest wall pain   . Anxiety   . Asthma   . Chronic diastolic CHF (congestive heart failure) (HCC)    Echo 01/2020: EF 60-65, normal wall motion, mild LVH, normal RV SF, RVSP 52.6 (moderate elevation), severe LAE, moderate RAE, trivial MR, mild MS (mean gradient 5.5 mmHg), mild aortic valve sclerosis (no AS); elevated E/e' c/w elevated LVEDP  . Cough   . DM type 2 (diabetes mellitus, type 2) ([place])   . GERD (gastroesophageal reflux disease)   .  History of diverticulitis of colon   . HTN (hypertension)   . Hyperlipidemia   . IBS (irritable bowel syndrome)   . Iron deficiency anemia   . Osteoporosis, unspecified   . Renal insufficiency 07/31/2017  . UTI (urinary tract infection)     Past Surgical History:  Procedure Laterality Date  . CARDIOVASCULAR STRESS TEST  02/25/04  . ESOPHAGOGASTRODUODENOSCOPY  12/26/01  . PACEMAKER IMPLANT N/A 08/02/2020   Procedure: PACEMAKER IMPLANT;  Surgeon: Deboraha Sprang, MD;  Location: [place] [facility];  Service: Cardiovascular;  Laterality: N/A;  . RIGHT OOPHORECTOMY  jan 2010    Family History  Problem Relation Age of Onset  . Lung cancer Brother   . Melanoma Brother   . Hypertension Mother   . Stroke Mother   . Other Father        poor circulation  . Atopy Neg Hx   . Asthma Neg Hx   . Breast cancer Neg Hx     Social History   Tobacco Use  . Smoking status: Former Smoker    Packs/day: 0.30    Years: 5.00    Pack years: 1.50    Types: Cigarettes    Quit date: 10/02/1986    Years since quitting: 33.9  . Smokeless tobacco: Never Used  Substance Use Topics  . Alcohol use: No    Subjective:  Patient was admitted to hospital on 07/29/20 with bradycardia ( pulse 40); had pacemaker placed and discharged on 11/2 with home health and close cardiology follow up; has been working with home PT and nursing care at home; is feeling better and "getting used to her pacemaker." Denies any chest pain or shortness of breath;  Would like to come back for her flu shot after the holidays;     Objective:  Vitals:   09/21/20 0854  BP: (!) 146/70  Pulse: 74  Temp: 98.2 F (36.8 C)  TempSrc: Oral  SpO2: 98%  Weight: 176 lb (79.8 kg)  Height: _0  (1.702 m)    General: Well developed, well nourished, in no acute distress  Skin : Warm and dry.  Head: Normocephalic and atraumatic  Lungs: Respirations unlabored; clear to auscultation bilaterally without wheeze, rales, rhonchi  CVS exam:  normal rate and regular rhythm.  Neurologic: Alert and oriented; speech intact; face symmetrical; moves all extremities well; CNII-XII intact without focal deficit   Assessment:  1. Chronic diastolic CHF (congestive heart failure) ([place])   2. Type 2 diabetes mellitus with stage 3 chronic kidney disease, with long-term current use of insulin, unspecified whether stage 3a or 3b CKD ([place])     Plan:  1. Recovering well from pacemaker placement; has no acute concerns; will continue with home health PT as ordered; follow-up here as needed; 2. Continue with her endocrinologist as scheduled;   Return for flu shot at later date;   Return for 1-2 weeks for nurse visit for flu shot.  No orders of the defined types were  placed in this encounter.   Requested Prescriptions    No prescriptions requested or ordered in this encounter

## 2020-10-06 ENCOUNTER — Ambulatory Visit: Payer: Medicare Other

## 2020-10-06 ENCOUNTER — Other Ambulatory Visit: Payer: Self-pay | Admitting: Internal Medicine

## 2020-10-08 ENCOUNTER — Ambulatory Visit (INDEPENDENT_AMBULATORY_CARE_PROVIDER_SITE_OTHER): Payer: Medicare Other

## 2020-10-08 ENCOUNTER — Other Ambulatory Visit: Payer: Self-pay

## 2020-10-08 DIAGNOSIS — Z23 Encounter for immunization: Secondary | ICD-10-CM

## 2020-10-09 DIAGNOSIS — I130 Hypertensive heart and chronic kidney disease with heart failure and stage 1 through stage 4 chronic kidney disease, or unspecified chronic kidney disease: Secondary | ICD-10-CM | POA: Diagnosis not present

## 2020-10-09 DIAGNOSIS — M810 Age-related osteoporosis without current pathological fracture: Secondary | ICD-10-CM | POA: Diagnosis not present

## 2020-10-09 DIAGNOSIS — N183 Chronic kidney disease, stage 3 unspecified: Secondary | ICD-10-CM | POA: Diagnosis not present

## 2020-10-09 DIAGNOSIS — D509 Iron deficiency anemia, unspecified: Secondary | ICD-10-CM | POA: Diagnosis not present

## 2020-10-09 DIAGNOSIS — E559 Vitamin D deficiency, unspecified: Secondary | ICD-10-CM | POA: Diagnosis not present

## 2020-10-09 DIAGNOSIS — I4891 Unspecified atrial fibrillation: Secondary | ICD-10-CM | POA: Diagnosis not present

## 2020-10-09 DIAGNOSIS — K219 Gastro-esophageal reflux disease without esophagitis: Secondary | ICD-10-CM | POA: Diagnosis not present

## 2020-10-09 DIAGNOSIS — Z7951 Long term (current) use of inhaled steroids: Secondary | ICD-10-CM | POA: Diagnosis not present

## 2020-10-09 DIAGNOSIS — I2510 Atherosclerotic heart disease of native coronary artery without angina pectoris: Secondary | ICD-10-CM | POA: Diagnosis not present

## 2020-10-09 DIAGNOSIS — J302 Other seasonal allergic rhinitis: Secondary | ICD-10-CM | POA: Diagnosis not present

## 2020-10-09 DIAGNOSIS — K589 Irritable bowel syndrome without diarrhea: Secondary | ICD-10-CM | POA: Diagnosis not present

## 2020-10-09 DIAGNOSIS — Z950 Presence of cardiac pacemaker: Secondary | ICD-10-CM | POA: Diagnosis not present

## 2020-10-09 DIAGNOSIS — I5033 Acute on chronic diastolic (congestive) heart failure: Secondary | ICD-10-CM | POA: Diagnosis not present

## 2020-10-09 DIAGNOSIS — K649 Unspecified hemorrhoids: Secondary | ICD-10-CM | POA: Diagnosis not present

## 2020-10-09 DIAGNOSIS — Z48812 Encounter for surgical aftercare following surgery on the circulatory system: Secondary | ICD-10-CM | POA: Diagnosis not present

## 2020-10-09 DIAGNOSIS — J45991 Cough variant asthma: Secondary | ICD-10-CM | POA: Diagnosis not present

## 2020-10-09 DIAGNOSIS — E1122 Type 2 diabetes mellitus with diabetic chronic kidney disease: Secondary | ICD-10-CM | POA: Diagnosis not present

## 2020-10-09 DIAGNOSIS — I442 Atrioventricular block, complete: Secondary | ICD-10-CM | POA: Diagnosis not present

## 2020-10-09 DIAGNOSIS — E785 Hyperlipidemia, unspecified: Secondary | ICD-10-CM | POA: Diagnosis not present

## 2020-10-09 DIAGNOSIS — Z794 Long term (current) use of insulin: Secondary | ICD-10-CM | POA: Diagnosis not present

## 2020-10-20 ENCOUNTER — Telehealth: Payer: Self-pay | Admitting: Internal Medicine

## 2020-10-20 ENCOUNTER — Other Ambulatory Visit: Payer: Self-pay | Admitting: Internal Medicine

## 2020-10-20 NOTE — Telephone Encounter (Signed)
Home number is busy. Left message on mobile number to call back.  A new prescription for hydralazine with correct instructions was sent and received today by pharmacy at 1:48 pm.

## 2020-10-20 NOTE — Telephone Encounter (Signed)
Pt c/o medication issue:  1. Name of Medication: hydrALAZINE (APRESOLINE) 25 MG tablet    2. How are you currently taking this medication (dosage and times per day)? As prescribed.  3. Are you having a reaction (difficulty breathing--STAT)? No.  4. What is your medication issue? Patient states that since November 2021 she's been taking this medication as prescribed (3 tablets by mouth 3 times a day) but when she went to go pick up her prescription it had a different set of instructions, she states that for this reason her pharmacy would not refill these meds. Patient states that she only has 3 pills left. Please advise.

## 2020-11-01 ENCOUNTER — Ambulatory Visit (INDEPENDENT_AMBULATORY_CARE_PROVIDER_SITE_OTHER): Payer: Medicare Other

## 2020-11-01 DIAGNOSIS — I442 Atrioventricular block, complete: Secondary | ICD-10-CM | POA: Diagnosis not present

## 2020-11-02 LAB — CUP PACEART REMOTE DEVICE CHECK
Battery Remaining Longevity: 163 mo
Battery Voltage: 3.19 V
Brady Statistic RV Percent Paced: 97.69 %
Date Time Interrogation Session: 20220201151909
Implantable Lead Implant Date: 20211101
Implantable Lead Location: 753860
Implantable Lead Model: 3830
Implantable Pulse Generator Implant Date: 20211101
Lead Channel Impedance Value: 361 Ohm
Lead Channel Impedance Value: 456 Ohm
Lead Channel Pacing Threshold Amplitude: 0.75 V
Lead Channel Pacing Threshold Pulse Width: 0.4 ms
Lead Channel Sensing Intrinsic Amplitude: 10.875 mV
Lead Channel Sensing Intrinsic Amplitude: 10.875 mV
Lead Channel Setting Pacing Amplitude: 2 V
Lead Channel Setting Pacing Pulse Width: 0.4 ms
Lead Channel Setting Sensing Sensitivity: 0.9 mV

## 2020-11-03 ENCOUNTER — Other Ambulatory Visit: Payer: Self-pay

## 2020-11-03 ENCOUNTER — Ambulatory Visit (INDEPENDENT_AMBULATORY_CARE_PROVIDER_SITE_OTHER): Payer: Medicare Other | Admitting: Endocrinology

## 2020-11-03 VITALS — BP 180/80 | HR 73 | Ht 67.5 in | Wt 179.4 lb

## 2020-11-03 DIAGNOSIS — Z794 Long term (current) use of insulin: Secondary | ICD-10-CM | POA: Diagnosis not present

## 2020-11-03 DIAGNOSIS — N183 Chronic kidney disease, stage 3 unspecified: Secondary | ICD-10-CM | POA: Diagnosis not present

## 2020-11-03 DIAGNOSIS — E1122 Type 2 diabetes mellitus with diabetic chronic kidney disease: Secondary | ICD-10-CM

## 2020-11-03 LAB — POCT GLYCOSYLATED HEMOGLOBIN (HGB A1C): Hemoglobin A1C: 5.8 % — AB (ref 4.0–5.6)

## 2020-11-03 MED ORDER — INSULIN LISPRO 100 UNIT/ML ~~LOC~~ SOLN: SUBCUTANEOUS | 0 refills | Status: DC

## 2020-11-03 NOTE — Progress Notes (Signed)
Subjective:    Patient ID: [name], female    DOB: 1936/07/26, 85 y.o.   MRN: [id number]  HPI Pt returns for f/u of diabetes mellitus:  DM type: Insulin-requiring type 2 Dx'ed: 6269 Complications: renal insuff Therapy: insulin since 2008 GDM: never DKA: never Severe hypoglycemia: never.   Pancreatitis: never.  Other: she takes multiple daily injections.   Interval history: no cbg record, but states cbg varies from 80-146.  pt states she feels well in general.  She takes insulin as rx'ed.  She has mild hypoglycemia approx once per month.  This happens in the middle of the night. It is in general highest in the afternoon.  Past Medical History:  Diagnosis Date  . Allergic rhinitis   . Anterior chest wall pain   . Anxiety   . Asthma   . Chronic diastolic CHF (congestive heart failure) (HCC)    Echo 01/2020: EF 60-65, normal wall motion, mild LVH, normal RV SF, RVSP 52.6 (moderate elevation), severe LAE, moderate RAE, trivial MR, mild MS (mean gradient 5.5 mmHg), mild aortic valve sclerosis (no AS); elevated E/e' c/w elevated LVEDP  . Cough   . DM type 2 (diabetes mellitus, type 2) ([place])   . GERD (gastroesophageal reflux disease)   . History of diverticulitis of colon   . HTN (hypertension)   . Hyperlipidemia   . IBS (irritable bowel syndrome)   . Iron deficiency anemia   . Osteoporosis, unspecified   . Renal insufficiency 07/31/2017  . UTI (urinary tract infection)     Past Surgical History:  Procedure Laterality Date  . CARDIOVASCULAR STRESS TEST  02/25/04  . ESOPHAGOGASTRODUODENOSCOPY  12/26/01  . PACEMAKER IMPLANT N/A 08/02/2020   Procedure: PACEMAKER IMPLANT;  Surgeon: Deboraha Sprang, MD;  Location: [facility];  Service: Cardiovascular;  Laterality: N/A;  . RIGHT OOPHORECTOMY  jan 2010    Social History   Socioeconomic History  . Marital status: Widowed    Spouse name: Not on file  . Number of children: Not on file  . Years of education: Not on file  .  Highest education level: Not on file  Occupational History  . Occupation: retired  Tobacco Use  . Smoking status: Former Smoker    Packs/day: 0.30    Years: 5.00    Pack years: 1.50    Types: Cigarettes    Quit date: 10/02/1986    Years since quitting: 34.1  . Smokeless tobacco: Never Used  Vaping Use  . Vaping Use: Never used  Substance and Sexual Activity  . Alcohol use: No  . Drug use: No  . Sexual activity: Not on file  Other Topics Concern  . Not on file  Social History Narrative  . Not on file   Social Determinants of Health   Financial Resource Strain: Not on file  Food Insecurity: Not on file  Transportation Needs: Not on file  Physical Activity: Not on file  Stress: Not on file  Social Connections: Not on file  Intimate Partner Violence: Not on file    Current Outpatient Medications on File Prior to Visit  Medication Sig Dispense Refill  . acetaminophen (TYLENOL) 325 MG tablet Take 650 mg by mouth every 6 (six) hours as needed for pain.    Marland Kitchen amLODipine (NORVASC) 5 MG tablet TAKE 1 TABLET BY MOUTH TWICE A DAY 180 tablet 3  . apixaban (ELIQUIS) 2.5 MG TABS tablet Take 1 tablet (2.5 mg total) by mouth 2 (two) times daily.  60 tablet 6  . Blood Glucose Monitoring Suppl (ONE TOUCH ULTRA 2) W/DEVICE KIT Check blood sugar 3 times per day. 1 each 0  . budesonide-formoterol (SYMBICORT) 160-4.5 MCG/ACT inhaler TAKE 2 PUFFS FIRST THING IN AM AND THEN ANOTHER 2 PUFFS ABOUT 12 HOURS LATER. 30.6 Inhaler 5  . cetirizine (ZYRTEC ALLERGY) 10 MG tablet Take 1 tablet (10 mg total) by mouth daily as needed for allergies.    . hydrALAZINE (APRESOLINE) 25 MG tablet TAKE 3 TABLETS (75 MG TOTAL) BY MOUTH 3 (THREE) TIMES DAILY. 270 tablet 10  . montelukast (SINGULAIR) 10 MG tablet TAKE 1 TABLET BY MOUTH AT BEDTIME (Patient taking differently: Take 10 mg by mouth at bedtime.) 90 tablet 5  . omeprazole (PRILOSEC) 40 MG capsule Take 1 capsule (40 mg total) by mouth daily. 30 capsule 11  .  potassium chloride (KLOR-CON) 10 MEQ tablet TAKE 1 TABLET BY MOUTH EVERY DAY 90 tablet 2  . pravastatin (PRAVACHOL) 40 MG tablet TAKE 1 TABLET BY MOUTH EVERY DAY 90 tablet 3  . albuterol (PROAIR HFA) 108 (90 Base) MCG/ACT inhaler Inhale 2 puffs into the lungs every 4 (four) hours as needed for wheezing. 18 g 11  . furosemide (LASIX) 40 MG tablet Take 1 tablet (40 mg total) by mouth daily. 90 tablet 3   No current facility-administered medications on file prior to visit.    Allergies  Allergen Reactions  . Aspirin Shortness Of Breath and Other (See Comments)    Caused asthma symptoms  . Ramipril Cough    Family History  Problem Relation Age of Onset  . Lung cancer Brother   . Melanoma Brother   . Hypertension Mother   . Stroke Mother   . Other Father        poor circulation  . Atopy Neg Hx   . Asthma Neg Hx   . Breast cancer Neg Hx     BP (!) 180/80 (BP Location: Right Arm, Patient Position: Sitting, Cuff Size: Normal)   Pulse 73   Ht 5' 7.5" (1.715 m)   Wt 179 lb 6.4 oz (81.4 kg)   SpO2 98%   BMI 27.68 kg/m    Review of Systems Denies LOC    Objective:   Physical Exam VITAL SIGNS:  See vs page GENERAL: no distress Pulses: dorsalis pedis intact bilat.   MSK: no deformity of the feet CV: trace bilat leg edema Skin:  no ulcer on the feet.  normal color and temp on the feet. Neuro: sensation is intact to touch on the feet Ext: there is bilateral onychomycosis of the toenails.    Lab Results  Component Value Date   HGBA1C 5.8 (A) 11/03/2020   Lab Results  Component Value Date   CREATININE 1.89 (H) 08/06/2020   BUN 41 (H) 08/06/2020   NA 142 08/06/2020   K 4.5 08/06/2020   CL 108 (H) 08/06/2020   CO2 19 (L) 08/06/2020      Assessment & Plan:  Insulin-requiring type 2 DM, with CRI Hypoglycemia, due to insulin.  HTN: is noted today  Patient Instructions  Your blood pressure is high today.  Please see your primary care provider soon, to have it  rechecked. Please decrease the supper insulin to 22 units. Please continue the same 5 units with breakfast. check your blood sugar twice a day.  vary the time of day when you check, between before the 3 meals, and at bedtime.  also check if you have symptoms of your blood  sugar being too high or too low.  please keep a record of the readings and bring it to your next appointment here (or you can bring the meter itself).  You can write it on any piece of paper.  please call us sooner if your blood sugar goes below 70, or if you have a lot of readings over 200. Please come back for a follow-up appointment in 3 months.

## 2020-11-03 NOTE — Patient Instructions (Addendum)
Your blood pressure is high today.  Please see your primary care provider soon, to have it rechecked. Please decrease the supper insulin to 22 units. Please continue the same 5 units with breakfast. check your blood sugar twice a day.  vary the time of day when you check, between before the 3 meals, and at bedtime.  also check if you have symptoms of your blood sugar being too high or too low.  please keep a record of the readings and bring it to your next appointment here (or you can bring the meter itself).  You can write it on any piece of paper.  please call us sooner if your blood sugar goes below 70, or if you have a lot of readings over 200. Please come back for a follow-up appointment in 3 months.

## 2020-11-04 ENCOUNTER — Ambulatory Visit (INDEPENDENT_AMBULATORY_CARE_PROVIDER_SITE_OTHER): Payer: Medicare Other | Admitting: Internal Medicine

## 2020-11-04 ENCOUNTER — Encounter: Payer: Self-pay | Admitting: Internal Medicine

## 2020-11-04 DIAGNOSIS — J45991 Cough variant asthma: Secondary | ICD-10-CM | POA: Diagnosis not present

## 2020-11-04 NOTE — Patient Instructions (Signed)
Plan A = Automatic = Always=    Symbicort 160 Take 2 puffs first thing in am and then another 2 puffs about 12 hours later.   Work on inhaler technique:  relax and gently blow all the way out then take a nice smooth deep breath back in, triggering the inhaler at same time you start breathing in.  Hold for up to 5 seconds if you can. Blow out thru nose. Rinse and gargle with water when done    Plan B = Backup (to supplement plan A, not to replace it) Only use your albuterol inhaler as a rescue medication to be used if you can't catch your breath by resting or doing a relaxed purse lip breathing pattern.  - The less you use it, the better it will work when you need it. - Ok to use the inhaler up to 2 puffs  every 4 hours if you must but call for appointment if use goes up over your usual need - Don't leave home without it !!  (think of it like the spare tire for your car)     Please schedule a follow up visit in 8 months but call sooner if needed

## 2020-11-04 NOTE — Assessment & Plan Note (Signed)
PFT's 03/18/08 minimal airflow obstruction  -PFT's January 04, 2010 No sign airflow obstruction   - FENO 01/25/2017  =   23 p am symb 160 x 2 pffs - Spirometry 01/25/2017  wnl x for min curvature on f/v p am symb 160 x 2 / no saba prior - 07/30/2017   After extensive coaching HFA effectiveness =    75% from baseline 50%  - Allergy profile 07/30/17  >  Eos 0.2 /  IgE  145  RAST pos mold  - FENO 01/28/2018  =   17 - Spirometry 01/28/2018  FEV1 1.22 (65%)  Ratio 79  -  11/04/2020  After extensive coaching inhaler device,  effectiveness =    80%> continue hfa   All goals of chronic asthma control met including optimal function and elimination of symptoms with minimal need for rescue therapy.  Contingencies discussed in full including contacting this office immediately if not controlling the symptoms using the rule of two's.            Each maintenance medication was reviewed in detail including emphasizing most importantly the difference between maintenance and prns and under what circumstances the prns are to be triggered using an action plan format where appropriate.  Total time for H and P, chart review, counseling, reviewing hfa device(s) and generating customized AVS unique to this office visit / same day charting = 28 min

## 2020-11-04 NOTE — Progress Notes (Signed)
Subjective:  Patient ID: [name], female   DOB: 11-Feb-1936 .   MRN: [id number]  Brief patient profile:  88 yobf quit smoking 1988 with h/o asthma  And nl baseline pfts ( as of 01/04/2010) who had been using Advair on a p.r.n. basis and noticing increasing dyspnea and need for rescue therapy x one mo when seen 01/06/08 for pulmonary evaluation so Advair stopped. Began Symbicort 160/4.68mg 2 puffs two times a day.  Returned 02/13/08 improved with decreased dyspnea and no rescue use   Returned 03/18/08 symptom free no rescue needed, stopped reglan, no flare    01/25/2017  f/u ov/[name] re: asthma / ? Allergic rhinitis  rx symb160/singulair  Chief Complaint  Patient presents with  . Follow-up    Last seen 2015. She c/o minimal wheezing for the past wk "b/c of the pollen". She also has minimal dry cough.   She is using proair 2 x per wk on average.   only uses alb if overdoes it - never noct needed Doe = MMRC2 = can't walk a nl pace on a flat grade s sob but does fine slow and flat eg shopping rec Prednisone 10 mg take  4 each am x 2 days,   2 each am x 2 days,  1 each am x 2 days and stop  For cough > mucinex dm up to 1200 mg every 12 hours and add Try prilosec otc 278m Take 30-60 min before first meal of the day and Pepcid ac (famotidine) 20 mg one @  bedtime until cough is completely gone for at least a week without the need for cough suppression GERD diet   01/28/2018  f/u ov/[name] re:  Asthma  Yearly f/u maiint on symb 160 2bid and singulair  Chief Complaint  Patient presents with  . Follow-up    Reports increased sneezing and dry cough with increase of pollen.   Dyspnea:  MMRC2 = can't walk a nl pace on a flat grade s sob but does fine slow and flat walking at mall  Cough: correlates with sneezing outdoors only Sleep: no resp symptoms SABA use:  Maybe twice a week  rec For itching/ sneezing > zyrtec 10 mg one at bedtime (over the counter) Work on inhaler technique:   Keep walking regularly  and see if using your albuterol 15 min before helps you do more  Or not> usually does not.   04/29/2018  f/u ov/[name] re:  Asthma / rhinitis improved with prn zyrtec maint symb/singulair Chief Complaint  Patient presents with  . Follow-up    Pt states things have been doing good since last visit. Pt has had to use her rescue inhaler but states she has not had to use it that often and when she has used it, she states it is only used once in a day's time.    Dyspnea:  MMRC2 = can't walk a nl pace on a flat grade s sob but does fine slow and flat  Cough: varies daytime mostly and dry  SABA use: before exertion sometimes  02: none  rec Work on peDoctor, hospitalechnique:     05/05/2019  f/u ov/[name] re: asthma/ rhinitis maint symb/ singulair  Chief Complaint  Patient presents with  . Follow-up    Breathing overall doing well. She is using her rescue inhaler once per day on average.    Dyspnea:  MMRC2 = can't walk a nl pace on a flat grade s sob but does fine  slow and flat  Cough: minimal Sleeping: ok 2 pillows  SABA use: maybe once a day when exerting  02: none  rec Work on inhaler technique:   05/04/2020  f/u ov/[name] re: asthma/ rhinitis not sure about ppi/ using benadryl helps some  Chief Complaint  Patient presents with  . Follow-up    Increased SOB and chest tightness over the past month. She has some cough- prod with min light yellow sputum. She is using her albuterol inhaler about 2 x per day.   Dyspnea:  MMRC2 = can't walk a nl pace on a flat grade s sob but does fine slow and flat  Cough: more cough assoc with pnds day > noct/ not taking omeprazole  Sleeping: 2 pillows ok  SABA use: 2x daily  02: none  rec Plan A = Automatic = Always=    Symbicort 160 Take 2 puffs first thing in am and then another 2 puffs about 12 hours later.  Work on inhaler technique:    Omerprazole 40 mg Take 30-60 min before first meal of the day  Prednisone 10 mg take  4 each am x 2 days,   2 each am  x 2 days,  1 each am x 2 days and stop  Plan B = Backup (to supplement plan A, not to replace it) Only use your albuterol inhaler as a rescue medication  Please schedule vaccine > pfizer 2nd shot on 06/04/20    11/04/2020  f/u ov/[name] re: asthma/ rhinitis Chief Complaint  Patient presents with  . Follow-up    Breathing is doing well and she has not had to use her rescue inhaler recently.    Dyspnea: MMRC2 = can't walk a nl pace on a flat grade s sob but does fine slow and flat  Cough: none/ less drainage sensation now  Sleeping: bed flat/ 2 pillows, any position SABA use: none 02: none    No obvious day to day or daytime variability or assoc excess/ purulent sputum or mucus plugs or hemoptysis or cp or chest tightness, subjective wheeze or overt sinus or hb symptoms.   Sleeping without nocturnal  or early am exacerbation  of respiratory  c/o's or need for noct saba. Also denies any obvious fluctuation of symptoms with weather or environmental changes or other aggravating or alleviating factors except as outlined above   No unusual exposure hx or h/o childhood pna/ asthma or knowledge of premature birth.  Current Allergies, Complete Past Medical History, Past Surgical History, Family History, and Social History were reviewed in [facility] record.  ROS  The following are not active complaints unless bolded Hoarseness, sore throat, dysphagia, dental problems, itching, sneezing,  nasal congestion or discharge of excess mucus or purulent secretions, ear ache,   fever, chills, sweats, unintended wt loss or wt gain, classically pleuritic or exertional cp,  orthopnea pnd or arm/hand swelling  or leg swelling, presyncope, palpitations, abdominal pain, anorexia, nausea, vomiting, diarrhea  or change in bowel habits or change in bladder habits, change in stools or change in urine, dysuria, hematuria,  rash, arthralgias, visual complaints, headache, numbness, weakness or ataxia or  problems with walking or coordination,  change in mood or  memory.        Current Meds  Medication Sig  . acetaminophen (TYLENOL) 325 MG tablet Take 650 mg by mouth every 6 (six) hours as needed for pain.  Marland Kitchen albuterol (PROAIR HFA) 108 (90 Base) MCG/ACT inhaler Inhale 2 puffs into the  lungs every 4 (four) hours as needed for wheezing.  Marland Kitchen amLODipine (NORVASC) 5 MG tablet TAKE 1 TABLET BY MOUTH TWICE A DAY  . apixaban (ELIQUIS) 2.5 MG TABS tablet Take 1 tablet (2.5 mg total) by mouth 2 (two) times daily.  . Blood Glucose Monitoring Suppl (ONE TOUCH ULTRA 2) W/DEVICE KIT Check blood sugar 3 times per day.  . budesonide-formoterol (SYMBICORT) 160-4.5 MCG/ACT inhaler TAKE 2 PUFFS FIRST THING IN AM AND THEN ANOTHER 2 PUFFS ABOUT 12 HOURS LATER.  . cetirizine (ZYRTEC ALLERGY) 10 MG tablet Take 1 tablet (10 mg total) by mouth daily as needed for allergies.  . furosemide (LASIX) 40 MG tablet Take 1 tablet (40 mg total) by mouth daily.  . hydrALAZINE (APRESOLINE) 25 MG tablet TAKE 3 TABLETS (75 MG TOTAL) BY MOUTH 3 (THREE) TIMES DAILY.  Marland Kitchen insulin lispro (HUMALOG) 100 UNIT/ML injection 5 UNITS WITH BREAKFAST, AND 22 UNITS WITH SUPPER  . montelukast (SINGULAIR) 10 MG tablet TAKE 1 TABLET BY MOUTH AT BEDTIME (Patient taking differently: Take 10 mg by mouth at bedtime.)  . omeprazole (PRILOSEC) 40 MG capsule Take 1 capsule (40 mg total) by mouth daily.  . potassium chloride (KLOR-CON) 10 MEQ tablet TAKE 1 TABLET BY MOUTH EVERY DAY  . pravastatin (PRAVACHOL) 40 MG tablet TAKE 1 TABLET BY MOUTH EVERY DAY                    Past Medical History:   OSTEOPOROSIS (ICD-733.00)  HYPERTENSION (ICD-401.9)  HYPERLIPIDEMIA (ICD-272.4)  DIVERTICULITIS, HX OF (ICD-V12.79)  DIABETES MELLITUS, TYPE II (ICD-250.00)  COLON CANCER, HX OF (ICD-V10.05)  ASTHMA (ICD-493.90)  -PFT's 03/18/08 minimal airflow obstruction  -PFT's January 04, 2010 No sign airflow obstruction  -Mastered HFA technique August 13, 2008 >  confimed November 23, 2009  ANXIETY (ICD-300.00)  ANEMIA-IRON DEFICIENCY (ICD-280.9)  ALLERGIC RHINITIS (ICD-477.9)  IBS  GERD            Objective:   Physical Exam  11/04/2020   [id number] 05/04/2020    185 05/05/2019    196  wt 202 November 23, 2009> 199 January 04, 2010 > 209  03/22/11 > 214 10/16/2013 >  01/25/2017   193> 01/28/2018   200    Vital signs reviewed  11/04/2020  - Note at rest 02 sats  97% on RA   General appearance:   amb pleasant bf nad   HEENT : pt wearing mask not removed for exam due to covid -19 concerns.    NECK :  without JVD/Nodes/TM/ nl carotid upstrokes bilaterally   LUNGS: no acc muscle use,  Nl contour chest which is clear to A and P bilaterally without cough on insp or exp maneuvers   CV:  RRR  no s3 or murmur or increase in P2, and no edema   ABD:  soft and nontender with nl inspiratory excursion in the supine position. No bruits or organomegaly appreciated, bowel sounds nl  MS:  Nl gait/ ext warm without deformities, calf tenderness, cyanosis or clubbing No obvious joint restrictions   SKIN: warm and dry without lesions    NEURO:  alert, approp, nl sensorium with  no motor or cerebellar deficits apparent.                     Assessment:

## 2020-11-09 NOTE — Progress Notes (Signed)
Remote pacemaker transmission.   

## 2020-11-16 ENCOUNTER — Encounter: Payer: Medicare Other | Admitting: Internal Medicine

## 2020-11-25 DIAGNOSIS — M170 Bilateral primary osteoarthritis of knee: Secondary | ICD-10-CM | POA: Diagnosis not present

## 2020-11-25 DIAGNOSIS — M1712 Unilateral primary osteoarthritis, left knee: Secondary | ICD-10-CM | POA: Diagnosis not present

## 2020-12-13 ENCOUNTER — Other Ambulatory Visit: Payer: Self-pay

## 2020-12-13 ENCOUNTER — Ambulatory Visit (INDEPENDENT_AMBULATORY_CARE_PROVIDER_SITE_OTHER): Payer: Medicare Other | Admitting: Podiatry

## 2020-12-13 DIAGNOSIS — M2041 Other hammer toe(s) (acquired), right foot: Secondary | ICD-10-CM

## 2020-12-13 DIAGNOSIS — M79675 Pain in left toe(s): Secondary | ICD-10-CM | POA: Diagnosis not present

## 2020-12-13 DIAGNOSIS — I442 Atrioventricular block, complete: Secondary | ICD-10-CM | POA: Insufficient documentation

## 2020-12-13 DIAGNOSIS — M2042 Other hammer toe(s) (acquired), left foot: Secondary | ICD-10-CM

## 2020-12-13 DIAGNOSIS — M79674 Pain in right toe(s): Secondary | ICD-10-CM

## 2020-12-13 DIAGNOSIS — E1151 Type 2 diabetes mellitus with diabetic peripheral angiopathy without gangrene: Secondary | ICD-10-CM

## 2020-12-13 DIAGNOSIS — L84 Corns and callosities: Secondary | ICD-10-CM | POA: Diagnosis not present

## 2020-12-13 DIAGNOSIS — B351 Tinea unguium: Secondary | ICD-10-CM

## 2020-12-13 DIAGNOSIS — M2012 Hallux valgus (acquired), left foot: Secondary | ICD-10-CM

## 2020-12-13 DIAGNOSIS — Z950 Presence of cardiac pacemaker: Secondary | ICD-10-CM | POA: Insufficient documentation

## 2020-12-13 DIAGNOSIS — M2011 Hallux valgus (acquired), right foot: Secondary | ICD-10-CM

## 2020-12-17 NOTE — Patient Instructions (Signed)
Preventive Care 83 Years and Older, Female Preventive care refers to lifestyle choices and visits with your health care Horacio Werth that can promote health and wellness. This includes:  A yearly physical exam. This is also called an annual wellness visit.  Regular dental and eye exams.  Immunizations.  Screening for certain conditions.  Healthy lifestyle choices, such as: ? Eating a healthy diet. ? Getting regular exercise. ? Not using drugs or products that contain nicotine and tobacco. ? Limiting alcohol use. What can I expect for my preventive care visit? Physical exam Your health care Airlie Blumenberg will check your:  Height and weight. These may be used to calculate your BMI (body mass index). BMI is a measurement that tells if you are at a healthy weight.  Heart rate and blood pressure.  Body temperature.  Skin for abnormal spots. Counseling Your health care Sharvi Mooneyhan may ask you questions about your:  Past medical problems.  Family's medical history.  Alcohol, tobacco, and drug use.  Emotional well-being.  Home life and relationship well-being.  Sexual activity.  Diet, exercise, and sleep habits.  History of falls.  Memory and ability to understand (cognition).  Work and work Statistician.  Pregnancy and menstrual history.  Access to firearms. What immunizations do I need? Vaccines are usually given at various ages, according to a schedule. Your health care Meshach Perry will recommend vaccines for you based on your age, medical history, and lifestyle or other factors, such as travel or where you work.   What tests do I need? Blood tests  Lipid and cholesterol levels. These may be checked every 5 years, or more often depending on your overall health.  Hepatitis C test.  Hepatitis B test. Screening  Lung cancer screening. You may have this screening every year starting at age 83 if you have a 30-pack-year history of smoking and currently smoke or have quit within  the past 15 years.  Colorectal cancer screening. ? All adults should have this screening starting at age 83 and continuing until age 83. ? Your health care Benecio Kluger may recommend screening at age 83 if you are at increased risk. ? You will have tests every 1-10 years, depending on your results and the type of screening test.  Diabetes screening. ? This is done by checking your blood sugar (glucose) after you have not eaten for a while (fasting). ? You may have this done every 1-3 years.  Mammogram. ? This may be done every 1-2 years. ? Talk with your health care Jerrold Haskell about how often you should have regular mammograms.  Abdominal aortic aneurysm (AAA) screening. You may need this if you are a current or former smoker.  BRCA-related cancer screening. This may be done if you have a family history of breast, ovarian, tubal, or peritoneal cancers. Other tests  STD (sexually transmitted disease) testing, if you are at risk.  Bone density scan. This is done to screen for osteoporosis. You may have this done starting at age 83. Talk with your health care Pantelis Elgersma about your test results, treatment options, and if necessary, the need for more tests. Follow these instructions at home: Eating and drinking  Eat a diet that includes fresh fruits and vegetables, whole grains, lean protein, and low-fat dairy products. Limit your intake of foods with high amounts of sugar, saturated fats, and salt.  Take vitamin and mineral supplements as recommended by your health care Keerat Denicola.  Do not drink alcohol if your health care Sandrine Bloodsworth tells you not to drink.  If you drink alcohol: ? Limit how much you have to 0-1 drink a day. ? Be aware of how much alcohol is in your drink. In the [place] one drink equals one 12 oz bottle of beer (355 mL), one 5 oz glass of wine (148 mL), or one 1 oz glass of hard liquor (44 mL).   Lifestyle  Take daily care of your teeth and gums. Brush your teeth every morning  and night with fluoride toothpaste. Floss one time each day.  Stay active. Exercise for at least 30 minutes 5 or more days each week.  Do not use any products that contain nicotine or tobacco, such as cigarettes, e-cigarettes, and chewing tobacco. If you need help quitting, ask your health care Welda Azzarello.  Do not use drugs.  If you are sexually active, practice safe sex. Use a condom or other form of protection in order to prevent STIs (sexually transmitted infections).  Talk with your health care Shelda Truby about taking a low-dose aspirin or statin.  Find healthy ways to cope with stress, such as: ? Meditation, yoga, or listening to music. ? Journaling. ? Talking to a trusted person. ? Spending time with friends and family. Safety  Always wear your seat belt while driving or riding in a vehicle.  Do not drive: ? If you have been drinking alcohol. Do not ride with someone who has been drinking. ? When you are tired or distracted. ? While texting.  Wear a helmet and other protective equipment during sports activities.  If you have firearms in your house, make sure you follow all gun safety procedures. What's next?  Visit your health care Norrine Ballester once a year for an annual wellness visit.  Ask your health care Tamryn Popko how often you should have your eyes and teeth checked.  Stay up to date on all vaccines. This information is not intended to replace advice given to you by your health care Nayla Dias. Make sure you discuss any questions you have with your health care Odean Mcelwain. Document Revised: 09/08/2020 Document Reviewed: 09/12/2018 Elsevier Patient Education  2021 [place].  Preventing Type 2 Diabetes Mellitus Type 2 diabetes, also called type 2 diabetes mellitus, is a long-term (chronic) disease that affects sugar (glucose) levels in your blood. Normally, a hormone called insulin allows glucose to enter cells in your body. The cells use glucose for energy. With type 2  diabetes, you will have one or both of these problems:  Your pancreas does not make enough insulin.  Cells in your body do not respond properly to insulin that your body makes (insulin resistance). Insulin resistance or lack of insulin causes extra glucose to build up in the blood instead of going into cells. As a result, high blood glucose (hyperglycemia) develops. That can cause many complications. Being overweight or obese and having an inactive (sedentary) lifestyle can increase your risk for diabetes. Type 2 diabetes can be delayed or prevented by making certain nutrition and lifestyle changes. How can this condition affect me? If you do not take steps to prevent diabetes, your blood glucose levels may keep increasing over time. Too much glucose in your blood for a long time can damage your blood vessels, heart, kidneys, nerves, and eyes. Type 2 diabetes can lead to chronic health problems and complications, such as:  Heart disease.  Stroke.  Blindness.  Kidney disease.  Depression.  Poor circulation in your feet and legs. In severe cases, a foot or leg may need to be surgically removed (amputated). What  can increase my risk? You may be more likely to develop type 2 diabetes if you:  Have type 2 diabetes in your family.  Are overweight or obese.  Have a sedentary lifestyle.  Have insulin resistance or a history of prediabetes.  Have a history of pregnancy-related (gestational) diabetes or polycystic ovary syndrome (PCOS). What actions can I take to prevent this? It can be difficult to recognize signs of type 2 diabetes. Taking action to prevent the disease before you develop symptoms is the best way to avoid possible damage to your body. Making certain nutrition and lifestyle changes may prevent or delay the disease and related health problems. Nutrition  Eat healthy meals and snacks regularly. Do not skip meals. Fruit or a handful of nuts is a healthy snack between  meals.  Drink water throughout the day. Avoid drinks that contain added sugar, such as soda or sweetened tea. Drink enough fluid to keep your urine pale yellow.  Follow instructions from your health care Debbrah Sampedro about eating or drinking restrictions.  Limit the amount of food you eat by: ? Controlling how much you eat at a time (portion size). ? Checking food labels for the serving sizes of food. ? Using a kitchen scale to weigh amounts of food.  Saut or steam food instead of frying it. Cook with water or broth instead of oils or butter.  Limit saturated fat and salt (sodium) in your diet. Have no more than 1 tsp (2,400 mg) of sodium a day. If you have heart disease or high blood pressure, use less than ? tsp (1,500 mg) of sodium a day.   Lifestyle  Lose weight if needed and as told. Your health care Amri Lien can determine how much weight loss is best for you and can help you lose weight safely.  If you are overweight or obese, you may be told to lose at least 5?7% of your body weight.  Manage blood pressure, cholesterol, and stress. Your health care Keyle Doby will help determine the best treatment for you.  Do not use any products that contain nicotine or tobacco, such as cigarettes, e-cigarettes, and chewing tobacco. If you need help quitting, ask your health care Genell Thede.   Activity  Do physical activity that makes your heart beat faster and makes you sweat (moderate intensity). Do this for at least 30 minutes on at least 5 days of the week, or as much as told by your health care Paytin Ramakrishnan.  Ask your health care Mahari Strahm what activities are safe for you. A mix of activities may be best, such as walking, swimming, cycling, and strength training.  Try to add physical activity into your day. For example: ? Park your car farther away than usual so that you walk more. ? Take a walk during your lunch break. ? Use stairs instead of elevators or escalators. ? Walk or bike to work  instead of driving.   Alcohol use If you drink alcohol:  Limit how much you use to: ? 0?1 drink a day for women who are not pregnant. ? 0?2 drinks a day for men.  Be aware of how much alcohol is in your drink. In the [place] one drink equals one 12 oz bottle of beer (355 mL), one 5 oz glass of wine (148 mL), or one 1 oz glass of hard liquor (44 mL). General information  Talk with your health care Tarini Carrier about your risk factors and how you can reduce your risk for diabetes.  Have your blood  glucose tested regularly, as told by your health care Jasslyn Finkel.  Get screening tests as told by your health care Marla Pouliot. You may have these regularly, especially if you have certain risk factors for type 2 diabetes.  Make an appointment with a registered dietitian. This diet and nutrition specialist can help you make a healthy eating plan and help you understand portion sizes and food labels. Where to find support  Ask your health care Aeralyn Barna to recommend a registered dietitian, a certified diabetes care and education specialist, or a weight loss program.  Look for local or online weight loss groups.  Join a gym, fitness club, or outdoor activity group, such as a walking club. Where to find more information To learn more about diabetes and diabetes prevention, visit:  American Diabetes Association (ADA): www.diabetes.[employer] of Diabetes and Digestive and Kidney Diseases: [employer] To learn more about healthy eating, visit:  U.S. Department of Agriculture Scientist, research (physical sciences)): FormerBoss.no  Office of Disease Prevention and Health Promotion (ODPHP): [place] Summary  You can delay or prevent type 2 diabetes by eating healthy foods, losing weight if needed, and increasing your physical activity.  Talk with your health care Kloie Whiting about your risk factors for type 2 diabetes and how you can reduce your risk.  It can be difficult to recognize the signs of type 2  diabetes. The best way to avoid possible damage to your body is to take action to prevent the disease before you develop symptoms.  Get screening tests as told by your health care Branston Halsted. This information is not intended to replace advice given to you by your health care Monae Topping. Make sure you discuss any questions you have with your health care Shahmeer Bunn. Document Revised: 04/14/2020 Document Reviewed: 04/14/2020 Elsevier Patient Education  2021 [place].  Dyslipidemia Dyslipidemia is an imbalance of waxy, fat-like substances (lipids) in the blood. The body needs lipids in small amounts. Dyslipidemia often involves a high level of cholesterol or triglycerides, which are types of lipids. Common forms of dyslipidemia include:  High levels of LDL cholesterol. LDL is the type of cholesterol that causes fatty deposits (plaques) to build up in the blood vessels that carry blood away from your heart (arteries).  Low levels of HDL cholesterol. HDL cholesterol is the type of cholesterol that protects against heart disease. High levels of HDL remove the LDL buildup from arteries.  High levels of triglycerides. Triglycerides are a fatty substance in the blood that is linked to a buildup of plaques in the arteries. What are the causes? Primary dyslipidemia is caused by changes (mutations) in genes that are passed down through families (inherited). These mutations cause several types of dyslipidemia. Secondary dyslipidemia is caused by lifestyle choices and diseases that lead to dyslipidemia, such as:  Eating a diet that is high in animal fat.  Not getting enough exercise.  Having diabetes, kidney disease, liver disease, or thyroid disease.  Drinking large amounts of alcohol.  Using certain medicines. What increases the risk? You are more likely to develop this condition if you are an older man or if you are a woman who has gone through menopause. Other risk factors include:  Having a  family history of dyslipidemia.  Taking certain medicines, including birth control pills, steroids, some diuretics, and beta-blockers.  Smoking cigarettes.  Eating a high-fat diet.  Having certain medical conditions such as diabetes, polycystic ovary syndrome (PCOS), kidney disease, liver disease, or hypothyroidism.  Not exercising regularly.  Being overweight or obese with too  much belly fat. What are the signs or symptoms? In most cases, dyslipidemia does not usually cause any symptoms. In severe cases, very high lipid levels can cause:  Fatty bumps under the skin (xanthomas).  White or gray ring around the black center (pupil) of the eye. Very high triglyceride levels can cause inflammation of the pancreas (pancreatitis). How is this diagnosed? Your health care Jasher Barkan may diagnose dyslipidemia based on a routine blood test (fasting blood test). Because most people do not have symptoms of the condition, this blood testing (lipid profile) is done on adults age 28 and older and is repeated every 5 years. This test checks:  Total cholesterol. This measures the total amount of cholesterol in your blood, including LDL cholesterol, HDL cholesterol, and triglycerides. A healthy number is below 200.  LDL cholesterol. The target number for LDL cholesterol is different for each person, depending on individual risk factors. Ask your health care Covey Baller what your LDL cholesterol should be.  HDL cholesterol. An HDL level of 60 or higher is best because it helps to protect against heart disease. A number below 61 for men or below 86 for women increases the risk for heart disease.  Triglycerides. A healthy triglyceride number is below 150. If your lipid profile is abnormal, your health care Rosalina Dingwall may do other blood tests.   How is this treated? Treatment depends on the type of dyslipidemia that you have and your other risk factors for heart disease and stroke. Your health care Vendetta Pittinger will  have a target range for your lipid levels based on this information. For many people, this condition may be treated by lifestyle changes, such as diet and exercise. Your health care Hjalmar Ballengee may recommend that you:  Get regular exercise.  Make changes to your diet.  Quit smoking if you smoke. If diet changes and exercise do not help you reach your goals, your health care Shauntelle Jamerson may also prescribe medicine to lower lipids. The most commonly prescribed type of medicine lowers your LDL cholesterol (statin drug). If you have a high triglyceride level, your Esmeralda Blanford may prescribe another type of drug (fibrate) or an omega-3 fish oil supplement, or both. Follow these instructions at home: Eating and drinking  Follow instructions from your health care Perseus Westall or dietitian about eating or drinking restrictions.  Eat a healthy diet as told by your health care Jerrie Schussler. This can help you reach and maintain a healthy weight, lower your LDL cholesterol, and raise your HDL cholesterol. This may include: ? Limiting your calories, if you are overweight. ? Eating more fruits, vegetables, whole grains, fish, and lean meats. ? Limiting saturated fat, trans fat, and cholesterol.  If you drink alcohol: ? Limit how much you use. ? Be aware of how much alcohol is in your drink. In the [place] one drink equals one 12 oz bottle of beer (355 mL), one 5 oz glass of wine (148 mL), or one 1 oz glass of hard liquor (44 mL).  Do not drink alcohol if: ? Your health care Shloime Keilman tells you not to drink. ? You are pregnant, may be pregnant, or are planning to become pregnant. Activity  Get regular exercise. Start an exercise and strength training program as told by your health care Mackenna Kamer. Ask your health care Iktan Aikman what activities are safe for you. Your health care Shamaya Kauer may recommend: ? 30 minutes of aerobic activity 4-6 days a week. Brisk walking is an example of aerobic activity. ? Strength training 2  days a  week. General instructions  Do not use any products that contain nicotine or tobacco, such as cigarettes, e-cigarettes, and chewing tobacco. If you need help quitting, ask your health care July Linam.  Take over-the-counter and prescription medicines only as told by your health care Anadalay Macdonell. This includes supplements.  Keep all follow-up visits as told by your health care Audra Bellard.   Contact a health care Maceo Hernan if:  You are: ? Having trouble sticking to your exercise or diet plan. ? Struggling to quit smoking or control your use of alcohol. Summary  Dyslipidemia often involves a high level of cholesterol or triglycerides, which are types of lipids.  Treatment depends on the type of dyslipidemia that you have and your other risk factors for heart disease and stroke.  For many people, treatment starts with lifestyle changes, such as diet and exercise.  Your health care Tiann Saha may prescribe medicine to lower lipids. This information is not intended to replace advice given to you by your health care Dartagnan Beavers. Make sure you discuss any questions you have with your health care Edana Aguado. Document Revised: 05/13/2018 Document Reviewed: 04/19/2018 Elsevier Patient Education  [place].

## 2020-12-19 ENCOUNTER — Encounter: Payer: Self-pay | Admitting: Podiatry

## 2020-12-19 NOTE — Progress Notes (Signed)
  Subjective:  Patient ID: [name], female    DOB: Oct 13, 1935,  MRN: [id number]  85 y.o. female presents with at risk foot care. Pt has h/o NIDDM with PAD and corn(s) right foot , callus(es) right foot and painful mycotic nails.  Pain interferes with ambulation. Aggravating factors include wearing enclosed shoe gear. Painful toenails interfere with ambulation. Aggravating factors include wearing enclosed shoe gear. Pain is relieved with periodic professional debridement. Painful corns and calluses are aggravated when weightbearing with and without shoegear. Pain is relieved with periodic professional debridement..    Patient's blood sugar was 170 mg/dl on Saturday.  Patient did not check blood glucose this morning.  PCP: [name], [place] and last visit was: 09/21/2020. She also sees Dr. Renato Shin for her diabetes and last visit was 11/03/2020.  Review of Systems: Negative except as noted in the HPI.   Allergies  Allergen Reactions  . Aspirin Shortness Of Breath and Other (See Comments)    Caused asthma symptoms  . Ramipril Cough    Objective:  There were no vitals filed for this visit. Constitutional Patient is a pleasant 85 y.o. African American female in NAD. AAO x 3.  Vascular Capillary refill time to digits immediate b/l. Faintly palpable pedal pulses b/l. Pedal hair absent. Lower extremity skin temperature gradient within normal limits. No pain with calf compression b/l. No cyanosis or clubbing noted.  Neurologic Normal speech. Protective sensation intact 5/5 intact bilaterally with 10g monofilament b/l.  Dermatologic Pedal skin with normal turgor, texture and tone bilaterally. No open wounds bilaterally. No interdigital macerations bilaterally. Toenails 1-5 b/l elongated, discolored, dystrophic, thickened, crumbly with subungual debris and tenderness to dorsal palpation. Hyperkeratotic lesion(s) R 5th toe and sub 5th met base b/l lower extremities.  No erythema, no  edema, no drainage, no fluctuance.  Orthopedic: Normal muscle strength 5/5 to all lower extremity muscle groups bilaterally. No pain crepitus or joint limitation noted with ROM b/l. Hallux valgus with bunion deformity noted b/l lower extremities. Hammertoes noted to the 2-5 bilaterally.   Hemoglobin A1C Latest Ref Rng & Units 11/03/2020 07/29/2020 12/20/2019  HGBA1C 4.0 - 5.6 % 5.8(A) 5.2 5.6  Some recent data might be hidden   Assessment:   1. Pain due to onychomycosis of toenails of both feet   2. Corns and callosities   3. Hallux valgus, acquired, bilateral   4. Acquired hammertoes of both feet   5. Type II diabetes mellitus with peripheral circulatory disorder [facility])    Plan:  Patient was evaluated and treated and all questions answered.  Onychomycosis with pain -Nails palliatively debridement as below. -Educated on self-care  Procedure: Nail Debridement Rationale: Pain Type of Debridement: manual, sharp debridement. Instrumentation: Nail nipper, rotary burr. Number of Nails: 10  -Examined patient. -Continue diabetic foot care principles. -Patient to continue soft, supportive shoe gear daily. -Toenails 1-5 b/l were debrided in length and girth with sterile nail nippers and dremel without iatrogenic bleeding.  -Corn(s) R 5th toe and callus(es) sub 5th met base b/l lower extremities were pared utilizing sterile scalpel blade without incident. Total number debrided =3. -Patient to report any pedal injuries to medical professional immediately. -Patient/POA to call should there be question/concern in the interim.  Return in about 3 months (around 03/15/2021).  Marzetta Board, DPM

## 2020-12-20 ENCOUNTER — Encounter: Payer: Self-pay | Admitting: Internal Medicine

## 2020-12-20 ENCOUNTER — Ambulatory Visit (INDEPENDENT_AMBULATORY_CARE_PROVIDER_SITE_OTHER): Payer: Medicare Other | Admitting: Internal Medicine

## 2020-12-20 ENCOUNTER — Other Ambulatory Visit: Payer: Self-pay

## 2020-12-20 VITALS — BP 140/80 | HR 66 | Ht 67.0 in | Wt 173.0 lb

## 2020-12-20 DIAGNOSIS — Z79899 Other long term (current) drug therapy: Secondary | ICD-10-CM | POA: Diagnosis not present

## 2020-12-20 DIAGNOSIS — I4891 Unspecified atrial fibrillation: Secondary | ICD-10-CM | POA: Diagnosis not present

## 2020-12-20 DIAGNOSIS — I442 Atrioventricular block, complete: Secondary | ICD-10-CM | POA: Diagnosis not present

## 2020-12-20 DIAGNOSIS — Z950 Presence of cardiac pacemaker: Secondary | ICD-10-CM

## 2020-12-20 LAB — BASIC METABOLIC PANEL
BUN/Creatinine Ratio: 15 (ref 12–28)
BUN: 36 mg/dL — ABNORMAL HIGH (ref 8–27)
CO2: 15 mmol/L — ABNORMAL LOW (ref 20–29)
Calcium: 9.7 mg/dL (ref 8.7–10.3)
Chloride: 104 mmol/L (ref 96–106)
Creatinine, Ser: 2.34 mg/dL — ABNORMAL HIGH (ref 0.57–1.00)
Glucose: 151 mg/dL — ABNORMAL HIGH (ref 65–99)
Potassium: 3.9 mmol/L (ref 3.5–5.2)
Sodium: 142 mmol/L (ref 134–144)
eGFR: 20 mL/min/{1.73_m2} — ABNORMAL LOW (ref >59)

## 2020-12-20 NOTE — Patient Instructions (Signed)
Medication Instructions:  Your physician recommends that you continue on your current medications as directed. Please refer to the Current Medication list given to you today.  *If you need a refill on your cardiac medications before your next appointment, please call your pharmacy*   Lab Work:  BMET today Your physician recommends that you continue on your current medications as directed. Please refer to the Current Medication list given to you today.  If you have labs (blood work) drawn today and your tests are completely normal, you will receive your results only by: Marland Kitchen MyChart Message (if you have MyChart) OR . A paper copy in the mail If you have any lab test that is abnormal or we need to change your treatment, we will call you to review the results.   Testing/Procedures: None ordered.    Follow-Up: At [facility], you and your health needs are our priority.  As part of our continuing mission to provide you with exceptional heart care, we have created designated Provider Care Teams.  These Care Teams include your primary Cardiologist (physician) and Advanced Practice Providers (APPs -  Physician Assistants and Nurse Practitioners) who all work together to provide you with the care you need, when you need it.  We recommend signing up for the patient portal called "MyChart".  Sign up information is provided on this After Visit Summary.  MyChart is used to connect with patients for Virtual Visits (Telemedicine).  Patients are able to view lab/test results, encounter notes, upcoming appointments, etc.  Non-urgent messages can be sent to your provider as well.   To learn more about what you can do with MyChart, go to [email or web address].    Your next appointment:   9 month(s)  The format for your next appointment:   In Person  Provider:   Virl Axe, MD

## 2020-12-20 NOTE — Progress Notes (Signed)
Patient Care Team: [name], [place] as PCP - General (Internal Medicine) Fay Records, MD as PCP - Cardiology (Cardiology) Richmond Campbell, MD as Attending Physician (Gastroenterology) Netta Cedars, MD as Attending Physician (Orthopedic Surgery) Tanda Rockers, MD as Attending Physician (Pulmonary Disease) Rutherford Guys, MD as Consulting Physician (Ophthalmology)   HPI  [name] is a 85 y.o. female seen in followup for Permanent AF/flutter, longstanding bradycardia MS- mod with secondary pulm HTN-- s/p PM LBBBA Medtronic 10/21  She notes significant improvement in functional capacity following device implantation.  Dyspnea has gone from being moderately limited to mildly limiting.  She was short of breath today walking from handicapped to the office.  Denies nocturnal dyspnea orthopnea or peripheral edema.   . Date Cr K Hgb  10/21 1.89<<2.91 4.5<<3.2 12.4     DATE TEST EF   11/17 CTA   % CAD-LAD mod  (FFR appar neg)  10/21 Echo   65-70 % PA press 26m MS mod -mean grad 780m          Thromboembolic risk factors ( age  -2, HTN-, DM-1, Gender-1) for a CHADSVASc Score of >=5 on Apixoban  Dosed appropriately ( age/Cr)       Records and Results Reviewed   Past Medical History:  Diagnosis Date  . Allergic rhinitis   . Anterior chest wall pain   . Anxiety   . Asthma   . Chronic diastolic CHF (congestive heart failure) (HCC)    Echo 01/2020: EF 60-65, normal wall motion, mild LVH, normal RV SF, RVSP 52.6 (moderate elevation), severe LAE, moderate RAE, trivial MR, mild MS (mean gradient 5.5 mmHg), mild aortic valve sclerosis (no AS); elevated E/e' c/w elevated LVEDP  . Cough   . DM type 2 (diabetes mellitus, type 2) ([place]  . GERD (gastroesophageal reflux disease)   . History of diverticulitis of colon   . HTN (hypertension)   . Hyperlipidemia   . IBS (irritable bowel syndrome)   . Iron deficiency anemia   . Osteoporosis, unspecified   . Renal  insufficiency 07/31/2017  . UTI (urinary tract infection)     Past Surgical History:  Procedure Laterality Date  . CARDIOVASCULAR STRESS TEST  02/25/04  . ESOPHAGOGASTRODUODENOSCOPY  12/26/01  . PACEMAKER IMPLANT N/A 08/02/2020   Procedure: PACEMAKER IMPLANT;  Surgeon: KlDeboraha SprangMD;  Location[facility];  Service: Cardiovascular;  Laterality: N/A;  . RIGHT OOPHORECTOMY  jan 2010    Current Meds  Medication Sig  . acetaminophen (TYLENOL) 325 MG tablet Take 650 mg by mouth every 6 (six) hours as needed for pain.  . Marland Kitchenlbuterol (PROAIR HFA) 108 (90 Base) MCG/ACT inhaler Inhale 2 puffs into the lungs every 4 (four) hours as needed for wheezing.  . Marland KitchenmLODipine (NORVASC) 5 MG tablet TAKE 1 TABLET BY MOUTH TWICE A DAY  . AMLODIPINE BENZOATE PO 2 tablet 2 TIMES DAILY (route: oral)  . apixaban (ELIQUIS) 2.5 MG TABS tablet Take 1 tablet (2.5 mg total) by mouth 2 (two) times daily.  . Blood Glucose Monitoring Suppl (ONE TOUCH ULTRA 2) W/DEVICE KIT Check blood sugar 3 times per day.  . budesonide-formoterol (SYMBICORT) 160-4.5 MCG/ACT inhaler TAKE 2 PUFFS FIRST THING IN AM AND THEN ANOTHER 2 PUFFS ABOUT 12 HOURS LATER.  . cetirizine (ZYRTEC ALLERGY) 10 MG tablet Take 1 tablet (10 mg total) by mouth daily as needed for allergies.  . furosemide (LASIX) 40 MG tablet Take 1 tablet (  40 mg total) by mouth daily.  . FUROSEMIDE PO 1 tablet DAILY (route: oral)  . hydrALAZINE (APRESOLINE) 25 MG tablet TAKE 3 TABLETS (75 MG TOTAL) BY MOUTH 3 (THREE) TIMES DAILY.  Marland Kitchen insulin lispro (HUMALOG) 100 UNIT/ML injection 5 UNITS WITH BREAKFAST, AND 22 UNITS WITH SUPPER  . montelukast (SINGULAIR) 10 MG tablet TAKE 1 TABLET BY MOUTH AT BEDTIME  . omeprazole (PRILOSEC) 40 MG capsule Take 1 capsule (40 mg total) by mouth daily.  . potassium chloride (KLOR-CON) 10 MEQ tablet TAKE 1 TABLET BY MOUTH EVERY DAY  . pravastatin (PRAVACHOL) 40 MG tablet TAKE 1 TABLET BY MOUTH EVERY DAY  . STUDY - ASPIRE - apixaban 2.5  mg or placebo tablet (PI-Sethi) 1 tablet 2 TIMES DAILY (route: oral)    Allergies  Allergen Reactions  . Aspirin Shortness Of Breath and Other (See Comments)    Caused asthma symptoms  . Ramipril Cough      Review of Systems negative except from HPI and PMH  Physical Exam BP 140/80 (BP Location: Left Arm, Patient Position: Sitting, Cuff Size: Normal)   Pulse 66   Ht '5\' 7"'  (1.702 m)   Wt 173 lb (78.5 kg)   SpO2 98%   BMI 27.10 kg/m  Well developed and well nourished in no acute distress HENT normal E scleral and icterus clear Neck Supple JVP flat; carotids brisk and full Clear to ausculation Device pocket well healed; without hematoma or erythema.  There is no tethering small keloid Regular rate and rhythm, no murmurs gallops or rub Soft with active bowel sounds No clubbing cyanosis  Edema Alert and oriented, grossly normal motor and sensory function Skin Warm and Dry  ECG aflutter high grade heart block with Vpacing -/14/46  CrCl cannot be calculated (Patient's most recent lab result is older than the maximum 21 days allowed.).   Assessment and  Plan  Bradycardia  Atrial flutter/fibrillation-permanent  Mitral stenosis-moderate  HFpEF-class IIb-IIIa  Pacemaker-Medtronic-left bundle branch block area  Renal insufficiency grade 3-4   Patient is significantly improved following device implantation.  She is euvolemic  Carlos at implant her rates were programmed low 50--90 so as not to encroach upon ventricular filling time given her mitral stenosis.  We will reprogram her >> 60--100 and have advised her to let us know if this has a deleterious impact on her functional status  Blood pressure well controlled.  She takes her Eliquis twice daily, hence, I asked her if she wanted to take her amlodipine twice daily or once a day, and having a half-life of 40 hours or so.  We will just leave it the way it is.  We will check her electrolytes and her renal function  again today.  Hemoglobin was stable 11/21    Current medicines are reviewed at length with the patient today .  The patient does not  have concerns regarding medicines.

## 2020-12-23 ENCOUNTER — Telehealth: Payer: Self-pay

## 2020-12-23 NOTE — Telephone Encounter (Signed)
Spoke with pt and advised of lab results per Dr Caryl Comes as below.  Pt states her PCP and endocrinology follow her kidney function and diabetes.  Pt verbalizes understanding and thanked Therapist, sports for the call.

## 2020-12-23 NOTE — Telephone Encounter (Signed)
-----   Message from Deboraha Sprang, MD sent at 12/22/2020  5:24 PM EDT ----- Please Inform Patient  Labs are normal x kidney function remains an issue and her BS is elevated Does she follow with her PCP or renal abut her kidney function  Thanks

## 2020-12-28 ENCOUNTER — Other Ambulatory Visit: Payer: Self-pay | Admitting: Internal Medicine

## 2020-12-29 MED ORDER — POTASSIUM CHLORIDE ER 10 MEQ PO TBCR: 10.0000 meq | EXTENDED_RELEASE_TABLET | Freq: Every day | ORAL | 2 refills | Status: DC

## 2021-01-05 ENCOUNTER — Other Ambulatory Visit: Payer: Self-pay | Admitting: Internal Medicine

## 2021-01-12 ENCOUNTER — Encounter: Payer: Self-pay | Admitting: Family

## 2021-01-12 ENCOUNTER — Other Ambulatory Visit: Payer: Self-pay

## 2021-01-12 ENCOUNTER — Ambulatory Visit (INDEPENDENT_AMBULATORY_CARE_PROVIDER_SITE_OTHER): Payer: Medicare Other | Admitting: Family

## 2021-01-12 ENCOUNTER — Ambulatory Visit (INDEPENDENT_AMBULATORY_CARE_PROVIDER_SITE_OTHER): Payer: Medicare Other

## 2021-01-12 VITALS — BP 152/60 | HR 85 | Temp 98.2°F | Ht 67.0 in | Wt 175.8 lb

## 2021-01-12 DIAGNOSIS — R200 Anesthesia of skin: Secondary | ICD-10-CM | POA: Diagnosis not present

## 2021-01-12 DIAGNOSIS — M19041 Primary osteoarthritis, right hand: Secondary | ICD-10-CM | POA: Diagnosis not present

## 2021-01-12 DIAGNOSIS — M1812 Unilateral primary osteoarthritis of first carpometacarpal joint, left hand: Secondary | ICD-10-CM | POA: Diagnosis not present

## 2021-01-12 DIAGNOSIS — M79641 Pain in right hand: Secondary | ICD-10-CM

## 2021-01-12 DIAGNOSIS — R202 Paresthesia of skin: Secondary | ICD-10-CM

## 2021-01-12 DIAGNOSIS — R7989 Other specified abnormal findings of blood chemistry: Secondary | ICD-10-CM | POA: Diagnosis not present

## 2021-01-12 DIAGNOSIS — M791 Myalgia, unspecified site: Secondary | ICD-10-CM

## 2021-01-12 DIAGNOSIS — M79642 Pain in left hand: Secondary | ICD-10-CM

## 2021-01-12 LAB — COMPREHENSIVE METABOLIC PANEL
ALT: 13 U/L (ref 0–35)
AST: 18 U/L (ref 0–37)
Albumin: 3.8 g/dL (ref 3.5–5.2)
Alkaline Phosphatase: 74 U/L (ref 39–117)
BUN: 33 mg/dL — ABNORMAL HIGH (ref 6–23)
CO2: 24 mEq/L (ref 19–32)
Calcium: 9.5 mg/dL (ref 8.4–10.5)
Chloride: 107 mEq/L (ref 96–112)
Creatinine, Ser: 2.28 mg/dL — ABNORMAL HIGH (ref 0.40–1.20)
GFR: 19.49 mL/min — ABNORMAL LOW (ref >60.00)
Glucose, Bld: 107 mg/dL — ABNORMAL HIGH (ref 70–99)
Potassium: 4 mEq/L (ref 3.5–5.1)
Sodium: 143 mEq/L (ref 135–145)
Total Bilirubin: 0.9 mg/dL (ref 0.2–1.2)
Total Protein: 7.2 g/dL (ref 6.0–8.3)

## 2021-01-12 LAB — VITAMIN B12: Vitamin B-12: 247 pg/mL (ref 211–911)

## 2021-01-12 LAB — SEDIMENTATION RATE: Sed Rate: 1 mm/hr (ref 0–30)

## 2021-01-12 LAB — MAGNESIUM: Magnesium: 1.9 mg/dL (ref 1.5–2.5)

## 2021-01-12 NOTE — Progress Notes (Signed)
[name] is a 85 y.o. female with the following history as recorded in EpicCare:  Patient Active Problem List   Diagnosis Date Noted  . Complete heart block ([place]) 12/13/2020  . Pacemaker - MDT 12/13/2020  . SOB (shortness of breath) 07/29/2020  . Pain in right knee 03/17/2020  . Chronic diastolic CHF (congestive heart failure) ([place])   . DKA (diabetic ketoacidoses) 12/20/2019  . Diverticulosis 12/10/2019  . Shortness of breath 01/30/2018  . Acute bronchiolitis 12/17/2017  . Wheezing 10/29/2017  . Vitamin D deficiency 07/31/2017  . Renal insufficiency 07/31/2017  . Knee pain 06/01/2017  . Morbid obesity due to excess calories ([place]) 01/25/2017  . Diabetes ([place]) 04/12/2016  . Cough 01/20/2015  . Atrial fibrillation ([place]) 05/28/2014  . Personal history of colonic polyps 05/28/2014  . Vomiting 01/29/2014  . CAD (coronary artery disease) 09/01/2013  . Routine general medical examination at a health care facility 07/17/2011  . Encounter for long-term (current) use of other medications 07/06/2011  . Nonspecific (abnormal) findings on radiological and other examination of body structure 11/09/2009  . IRRITABLE BOWEL SYNDROME 05/07/2009  . CHEST PAIN 05/07/2009  . ADNEXAL MASS, RIGHT 07/22/2008  . HEMORRHOIDS, RECURRENT 01/27/2008  . Diverticulitis 01/27/2008  . OTH ABNORMAL FIND RAD EXAMINATION BREAST 01/27/2008  . GERD 01/13/2008  . UTI 09/28/2007  . Dyslipidemia 05/27/2007  . ANEMIA-IRON DEFICIENCY 05/27/2007  . ANXIETY 05/27/2007  . Essential hypertension 05/27/2007  . Seasonal allergic rhinitis 05/27/2007  . Cough variant asthma 05/27/2007  . Osteoporosis 05/27/2007  . DIVERTICULITIS, HX OF 05/27/2007    Current Outpatient Medications  Medication Sig Dispense Refill  . acetaminophen (TYLENOL) 325 MG tablet Take 650 mg by mouth every 6 (six) hours as needed for pain.    Marland Kitchen albuterol (PROAIR HFA) 108 (90 Base) MCG/ACT inhaler Inhale 2 puffs into the lungs every 4 (four)  hours as needed for wheezing. 18 g 11  . amLODipine (NORVASC) 5 MG tablet TAKE 1 TABLET BY MOUTH TWICE A DAY 180 tablet 3  . apixaban (ELIQUIS) 2.5 MG TABS tablet Take 1 tablet (2.5 mg total) by mouth 2 (two) times daily. 60 tablet 6  . Blood Glucose Monitoring Suppl (ONE TOUCH ULTRA 2) W/DEVICE KIT Check blood sugar 3 times per day. 1 each 0  . budesonide-formoterol (SYMBICORT) 160-4.5 MCG/ACT inhaler TAKE 2 PUFFS FIRST THING IN AM AND THEN ANOTHER 2 PUFFS ABOUT 12 HOURS LATER. 30.6 Inhaler 5  . cetirizine (ZYRTEC ALLERGY) 10 MG tablet Take 1 tablet (10 mg total) by mouth daily as needed for allergies.    . furosemide (LASIX) 40 MG tablet Take 1 tablet (40 mg total) by mouth daily. 90 tablet 3  . hydrALAZINE (APRESOLINE) 25 MG tablet TAKE 3 TABLETS (75 MG TOTAL) BY MOUTH 3 (THREE) TIMES DAILY. 270 tablet 10  . insulin lispro (HUMALOG) 100 UNIT/ML injection 5 UNITS WITH BREAKFAST, AND 22 UNITS WITH SUPPER 20 mL 0  . montelukast (SINGULAIR) 10 MG tablet TAKE 1 TABLET BY MOUTH AT BEDTIME 90 tablet 3  . omeprazole (PRILOSEC) 40 MG capsule Take 1 capsule (40 mg total) by mouth daily. 30 capsule 11  . potassium chloride (KLOR-CON) 10 MEQ tablet Take 1 tablet (10 mEq total) by mouth daily. 90 tablet 2  . pravastatin (PRAVACHOL) 40 MG tablet TAKE 1 TABLET BY MOUTH EVERY DAY 90 tablet 3  . STUDY - ASPIRE - apixaban 2.5 mg or placebo tablet (PI-Sethi) 1 tablet 2 TIMES DAILY (route: oral)     No  current facility-administered medications for this visit.    Allergies: Aspirin and Ramipril  Past Medical History:  Diagnosis Date  . Allergic rhinitis   . Anterior chest wall pain   . Anxiety   . Asthma   . Chronic diastolic CHF (congestive heart failure) (HCC)    Echo 01/2020: EF 60-65, normal wall motion, mild LVH, normal RV SF, RVSP 52.6 (moderate elevation), severe LAE, moderate RAE, trivial MR, mild MS (mean gradient 5.5 mmHg), mild aortic valve sclerosis (no AS); elevated E/e' c/w elevated LVEDP  .  Cough   . DM type 2 (diabetes mellitus, type 2) ([place])   . GERD (gastroesophageal reflux disease)   . History of diverticulitis of colon   . HTN (hypertension)   . Hyperlipidemia   . IBS (irritable bowel syndrome)   . Iron deficiency anemia   . Osteoporosis, unspecified   . Renal insufficiency 07/31/2017  . UTI (urinary tract infection)     Past Surgical History:  Procedure Laterality Date  . CARDIOVASCULAR STRESS TEST  02/25/04  . ESOPHAGOGASTRODUODENOSCOPY  12/26/01  . PACEMAKER IMPLANT N/A 08/02/2020   Procedure: PACEMAKER IMPLANT;  Surgeon: Deboraha Sprang, MD;  Location: [facility];  Service: Cardiovascular;  Laterality: N/A;  . RIGHT OOPHORECTOMY  jan 2010    Family History  Problem Relation Age of Onset  . Lung cancer Brother   . Melanoma Brother   . Hypertension Mother   . Stroke Mother   . Other Father        poor circulation  . Atopy Neg Hx   . Asthma Neg Hx   . Breast cancer Neg Hx     Social History   Tobacco Use  . Smoking status: Former Smoker    Packs/day: 0.30    Years: 5.00    Pack years: 1.50    Types: Cigarettes    Quit date: 10/02/1986    Years since quitting: 34.3  . Smokeless tobacco: Never Used  Substance Use Topics  . Alcohol use: No    Subjective:   Bilateral hand pain- right hand more problematic; most noticeable in fingers;  Notes that fingers can be painful throughout the day; no known injury or pain; does get some relief with Tylenol; per patient, she feels like the symptoms started after taking a new blood pressure medication- suspicious that related to starting Hydralazine. "My fingers were fine before I started that medication."   Objective:  Vitals:   01/12/21 1117  BP: (!) 152/60  Pulse: 85  Temp: 98.2 F (36.8 C)  TempSrc: Oral  SpO2: 97%  Weight: 175 lb 12.8 oz (79.7 kg)  Height: '5\' 7"'  (1.702 m)    General: Well developed, well nourished, in no acute distress  Skin : Warm and dry.  Head: Normocephalic and  atraumatic  Eyes: Sclera and conjunctiva clear; pupils round and reactive to light; extraocular movements intact  Ears: External normal; canals clear; tympanic membranes normal  Oropharynx: Pink, supple. No suspicious lesions  Neck: Supple without thyromegaly, adenopathy  Lungs: Respirations unlabored; clear to auscultation bilaterally without wheeze, rales, rhonchi  CVS exam: normal rate and regular rhythm.  Musculoskeletal: No deformities; no active joint inflammation  Extremities: No edema, cyanosis, clubbing  Vessels: Symmetric bilaterally  Neurologic: Alert and oriented; speech intact; face symmetrical; moves all extremities well; CNII-XII intact without focal deficit   Assessment:  1. Myalgia   2. Numbness and tingling in both hands   3. Bilateral hand pain   4. Elevated serum  creatinine     Plan:  ? Related to use of Hydralazine; will update labs and X-rays today; follow up to be determined; Re-check creatinine level- may need to consider referral to nephrology;  This visit occurred during the SARS-CoV-2 public health emergency.  Safety protocols were in place, including screening questions prior to the visit, additional usage of staff PPE, and extensive cleaning of exam room while observing appropriate contact time as indicated for disinfecting solutions.     No follow-ups on file.  Orders Placed This Encounter  Procedures  . DG Hand Complete Left    Standing Status:   Future    Number of Occurrences:   1    Standing Expiration Date:   01/12/2022    Order Specific Question:   Reason for Exam (SYMPTOM  OR DIAGNOSIS REQUIRED)    Answer:   hand pain    Order Specific Question:   Preferred imaging location?    Answer:   [facility]  . DG Hand Complete Right    Standing Status:   Future    Number of Occurrences:   1    Standing Expiration Date:   01/12/2022    Order Specific Question:   Reason for Exam (SYMPTOM  OR DIAGNOSIS REQUIRED)    Answer:   hand pain     Order Specific Question:   Preferred imaging location?    Answer:   [facility]  . Antinuclear Antib (ANA)    Standing Status:   Future    Number of Occurrences:   1    Standing Expiration Date:   01/12/2022  . Sedimentation rate    Standing Status:   Future    Number of Occurrences:   1    Standing Expiration Date:   01/12/2022  . Rheumatoid Factor    Standing Status:   Future    Number of Occurrences:   1    Standing Expiration Date:   01/12/2022  . Vitamin B12    Standing Status:   Future    Number of Occurrences:   1    Standing Expiration Date:   01/12/2022  . Magnesium    Standing Status:   Future    Number of Occurrences:   1    Standing Expiration Date:   01/12/2022  . Comp Met (CMET)    Standing Status:   Future    Number of Occurrences:   1    Standing Expiration Date:   01/12/2022    Requested Prescriptions    No prescriptions requested or ordered in this encounter

## 2021-01-12 NOTE — Patient Instructions (Signed)
The new phone number is [phone]; [facility]

## 2021-01-14 LAB — RHEUMATOID FACTOR: Rheumatoid fact SerPl-aCnc: 15 IU/mL — ABNORMAL HIGH (ref <14)

## 2021-01-14 LAB — ANA: Anti Nuclear Antibody (ANA): NEGATIVE

## 2021-01-17 ENCOUNTER — Telehealth: Payer: Self-pay | Admitting: Family

## 2021-01-17 ENCOUNTER — Other Ambulatory Visit: Payer: Self-pay | Admitting: Family

## 2021-01-17 DIAGNOSIS — R7989 Other specified abnormal findings of blood chemistry: Secondary | ICD-10-CM

## 2021-01-17 DIAGNOSIS — M1120 Other chondrocalcinosis, unspecified site: Secondary | ICD-10-CM

## 2021-01-17 NOTE — Telephone Encounter (Signed)
I have called pt back and answered her question about the OTC B12.

## 2021-01-17 NOTE — Progress Notes (Signed)
1) Her B12 level is at the low end of normal- I would like her to start taking a daily OTC B12 supplement; this could explain some of the sensation in her fingertips. 2) I am going to have her see a kidney doctor as we discussed for medication evaluation and make any changes that could help protect the kidneys. 3) Based on her X-rays, I am wondering if she could have a form of gout; the hydralazine could be aggravating it as she suspects. I want her to see a rheumatologist for further evaluation- I don't have the resources here to make the final diagnosis.  4) She should call her cardiologist to discuss her concerns about hydralazine- they may want to go ahead and make a change for her in the interim.

## 2021-01-17 NOTE — Telephone Encounter (Signed)
Patient calling to discuss her recent lab work.

## 2021-01-24 ENCOUNTER — Telehealth: Payer: Self-pay | Admitting: Internal Medicine

## 2021-01-24 NOTE — Telephone Encounter (Signed)
Stop hydralazine      Follow sympotms and BP    If improves, tell pt to call   Will need to try another med for BP

## 2021-01-24 NOTE — Telephone Encounter (Signed)
Pt c/o medication issue:  1. Name of Medication: hydrALAZINE (APRESOLINE) 25 MG tablet  2. How are you currently taking this medication (dosage and times per day)? As directed  3. Are you having a reaction (difficulty breathing--STAT)? yes  4. What is your medication issue? Patient thinks that this medication is causing her hands to feel tingly. She wants to know if she should come in and get checked or have her medication adjusted. Please advise.

## 2021-01-24 NOTE — Telephone Encounter (Signed)
Patient reports tingling in both hands, most of the time but not constant.  Right worse than left.  Tylenol helps   Been happening for "a while now" but it is getting worse.  Did not start until after hydralazine started.  She saw her PCP who did labs and set her up to see rheumatology this week.   I did not ask her to hold hydralazine because she will need something in its place for bp if she stops this.  She is aware I will forward to Dr. Harrington Challenger to make recommendations and will call her back later this week.

## 2021-01-25 NOTE — Telephone Encounter (Signed)
Called patient.  Adv of recommendation to stop hydralazine.  She does not want to stop abruptly.  She is going to go down to 2 tablets (50 mg) 3 times a day and monitor BP and for any change in symptoms.  Will go down to 1 tablet (25 mg) 3 times daily if BP stays okay.  She knows to call with update on BP and symptoms.

## 2021-01-27 ENCOUNTER — Other Ambulatory Visit: Payer: Self-pay | Admitting: Endocrinology

## 2021-01-27 DIAGNOSIS — N183 Chronic kidney disease, stage 3 unspecified: Secondary | ICD-10-CM

## 2021-01-27 DIAGNOSIS — E1122 Type 2 diabetes mellitus with diabetic chronic kidney disease: Secondary | ICD-10-CM

## 2021-01-31 ENCOUNTER — Ambulatory Visit (INDEPENDENT_AMBULATORY_CARE_PROVIDER_SITE_OTHER): Payer: Medicare Other

## 2021-01-31 DIAGNOSIS — I442 Atrioventricular block, complete: Secondary | ICD-10-CM

## 2021-02-01 LAB — CUP PACEART REMOTE DEVICE CHECK
Battery Remaining Longevity: 158 mo
Battery Voltage: 3.16 V
Brady Statistic RV Percent Paced: 99.25 %
Date Time Interrogation Session: 20220502010844
Implantable Lead Implant Date: 20211101
Implantable Lead Location: 753860
Implantable Lead Model: 3830
Implantable Pulse Generator Implant Date: 20211101
Lead Channel Impedance Value: 323 Ohm
Lead Channel Impedance Value: 437 Ohm
Lead Channel Pacing Threshold Amplitude: 1 V
Lead Channel Pacing Threshold Pulse Width: 0.4 ms
Lead Channel Sensing Intrinsic Amplitude: 8.5 mV
Lead Channel Sensing Intrinsic Amplitude: 8.5 mV
Lead Channel Setting Pacing Amplitude: 2 V
Lead Channel Setting Pacing Pulse Width: 0.4 ms
Lead Channel Setting Sensing Sensitivity: 0.9 mV

## 2021-02-02 ENCOUNTER — Ambulatory Visit (INDEPENDENT_AMBULATORY_CARE_PROVIDER_SITE_OTHER): Payer: Medicare Other | Admitting: Endocrinology

## 2021-02-02 ENCOUNTER — Other Ambulatory Visit: Payer: Self-pay

## 2021-02-02 VITALS — BP 190/84 | HR 84 | Ht 67.0 in | Wt 181.2 lb

## 2021-02-02 DIAGNOSIS — E1122 Type 2 diabetes mellitus with diabetic chronic kidney disease: Secondary | ICD-10-CM

## 2021-02-02 DIAGNOSIS — Z794 Long term (current) use of insulin: Secondary | ICD-10-CM

## 2021-02-02 DIAGNOSIS — N183 Chronic kidney disease, stage 3 unspecified: Secondary | ICD-10-CM | POA: Diagnosis not present

## 2021-02-02 LAB — POCT GLYCOSYLATED HEMOGLOBIN (HGB A1C): Hemoglobin A1C: 5.1 % (ref 4.0–5.6)

## 2021-02-02 MED ORDER — INSULIN LISPRO 100 UNIT/ML IJ SOLN
INTRAMUSCULAR | 1 refills | Status: DC
Start: 2021-02-02 — End: 2021-05-04

## 2021-02-02 NOTE — Patient Instructions (Addendum)
Your blood pressure is high today.  Please see your primary care provider soon, to have it rechecked. Please decrease the supper insulin to 18 units. Please continue the same 5 units with breakfast. check your blood sugar twice a day.  vary the time of day when you check, between before the 3 meals, and at bedtime.  also check if you have symptoms of your blood sugar being too high or too low.  please keep a record of the readings and bring it to your next appointment here (or you can bring the meter itself).  You can write it on any piece of paper.  please call us sooner if your blood sugar goes below 70, or if you have a lot of readings over 200. Please come back for a follow-up appointment in 4 months.

## 2021-02-02 NOTE — Progress Notes (Signed)
Subjective:    Patient ID: [name], female    DOB: 11-05-1935, 85 y.o.   MRN: [id number]  HPI Pt returns for f/u of diabetes mellitus:  DM type: Insulin-requiring type 2 Dx'ed: 9629 Complications: renal insuff Therapy: insulin since 2008 GDM: never DKA: never Severe hypoglycemia: never.   Pancreatitis: never.  Other: she takes multiple daily injections; fructosamine converts to 1% higher than A1c itself.   Interval history: no cbg record, but states cbg varies from 150-180.  pt states she feels well in general.  She takes insulin as rx'ed.  She seldom has hypoglycemia, and these episodes are mild.  This happens in the middle of the night.  It is in general highest in the afternoon.   Past Medical History:  Diagnosis Date  . Allergic rhinitis   . Anterior chest wall pain   . Anxiety   . Asthma   . Chronic diastolic CHF (congestive heart failure) (HCC)    Echo 01/2020: EF 60-65, normal wall motion, mild LVH, normal RV SF, RVSP 52.6 (moderate elevation), severe LAE, moderate RAE, trivial MR, mild MS (mean gradient 5.5 mmHg), mild aortic valve sclerosis (no AS); elevated E/e' c/w elevated LVEDP  . Cough   . DM type 2 (diabetes mellitus, type 2) ([place])   . GERD (gastroesophageal reflux disease)   . History of diverticulitis of colon   . HTN (hypertension)   . Hyperlipidemia   . IBS (irritable bowel syndrome)   . Iron deficiency anemia   . Osteoporosis, unspecified   . Renal insufficiency 07/31/2017  . UTI (urinary tract infection)     Past Surgical History:  Procedure Laterality Date  . CARDIOVASCULAR STRESS TEST  02/25/04  . ESOPHAGOGASTRODUODENOSCOPY  12/26/01  . PACEMAKER IMPLANT N/A 08/02/2020   Procedure: PACEMAKER IMPLANT;  Surgeon: Deboraha Sprang, MD;  Location: [facility];  Service: Cardiovascular;  Laterality: N/A;  . RIGHT OOPHORECTOMY  jan 2010    Social History   Socioeconomic History  . Marital status: Widowed    Spouse name: Not on file  . Number  of children: Not on file  . Years of education: Not on file  . Highest education level: Not on file  Occupational History  . Occupation: retired  Tobacco Use  . Smoking status: Former Smoker    Packs/day: 0.30    Years: 5.00    Pack years: 1.50    Types: Cigarettes    Quit date: 10/02/1986    Years since quitting: 34.3  . Smokeless tobacco: Never Used  Vaping Use  . Vaping Use: Never used  Substance and Sexual Activity  . Alcohol use: No  . Drug use: No  . Sexual activity: Not on file  Other Topics Concern  . Not on file  Social History Narrative  . Not on file   Social Determinants of Health   Financial Resource Strain: Not on file  Food Insecurity: Not on file  Transportation Needs: Not on file  Physical Activity: Not on file  Stress: Not on file  Social Connections: Not on file  Intimate Partner Violence: Not on file    Current Outpatient Medications on File Prior to Visit  Medication Sig Dispense Refill  . acetaminophen (TYLENOL) 325 MG tablet Take 650 mg by mouth every 6 (six) hours as needed for pain.    Marland Kitchen amLODipine (NORVASC) 5 MG tablet TAKE 1 TABLET BY MOUTH TWICE A DAY 180 tablet 3  . apixaban (ELIQUIS) 2.5 MG TABS tablet Take  1 tablet (2.5 mg total) by mouth 2 (two) times daily. 60 tablet 6  . Blood Glucose Monitoring Suppl (ONE TOUCH ULTRA 2) W/DEVICE KIT Check blood sugar 3 times per day. 1 each 0  . budesonide-formoterol (SYMBICORT) 160-4.5 MCG/ACT inhaler TAKE 2 PUFFS FIRST THING IN AM AND THEN ANOTHER 2 PUFFS ABOUT 12 HOURS LATER. 30.6 Inhaler 5  . cetirizine (ZYRTEC ALLERGY) 10 MG tablet Take 1 tablet (10 mg total) by mouth daily as needed for allergies.    . hydrALAZINE (APRESOLINE) 25 MG tablet TAKE 3 TABLETS (75 MG TOTAL) BY MOUTH 3 (THREE) TIMES DAILY. (Patient taking differently: Take 50 mg by mouth 3 (three) times daily.) 270 tablet 10  . montelukast (SINGULAIR) 10 MG tablet TAKE 1 TABLET BY MOUTH AT BEDTIME 90 tablet 3  . omeprazole (PRILOSEC) 40 MG  capsule Take 1 capsule (40 mg total) by mouth daily. 30 capsule 11  . potassium chloride (KLOR-CON) 10 MEQ tablet Take 1 tablet (10 mEq total) by mouth daily. 90 tablet 2  . pravastatin (PRAVACHOL) 40 MG tablet TAKE 1 TABLET BY MOUTH EVERY DAY 90 tablet 3  . STUDY - ASPIRE - apixaban 2.5 mg or placebo tablet (PI-Sethi) 1 tablet 2 TIMES DAILY (route: oral)    . albuterol (PROAIR HFA) 108 (90 Base) MCG/ACT inhaler Inhale 2 puffs into the lungs every 4 (four) hours as needed for wheezing. 18 g 11   No current facility-administered medications on file prior to visit.    Allergies  Allergen Reactions  . Aspirin Shortness Of Breath and Other (See Comments)    Caused asthma symptoms  . Ramipril Cough    Family History  Problem Relation Age of Onset  . Lung cancer Brother   . Melanoma Brother   . Hypertension Mother   . Stroke Mother   . Other Father        poor circulation  . Atopy Neg Hx   . Asthma Neg Hx   . Breast cancer Neg Hx     BP (!) 190/84 (BP Location: Right Arm, Patient Position: Sitting, Cuff Size: Normal)   Pulse 84   Ht '5\' 7"'  (1.702 m)   Wt 181 lb 3.2 oz (82.2 kg)   SpO2 98%   BMI 28.38 kg/m   Review of Systems     Objective:   Physical Exam VITAL SIGNS:  See vs page GENERAL: no distress Pulses: dorsalis pedis intact bilat.   MSK: no deformity of the feet CV: no leg edema Skin:  no ulcer on the feet.  normal color and temp on the feet. Neuro: sensation is intact to touch on the feet.     Lab Results  Component Value Date   HGBA1C 5.1 02/02/2021   Lab Results  Component Value Date   CREATININE 2.28 (H) 01/12/2021   BUN 33 (H) 01/12/2021   NA 143 01/12/2021   K 4.0 01/12/2021   CL 107 01/12/2021   CO2 24 01/12/2021       Assessment & Plan:  Insulin-requiring type 2 DM: overcontrolled Hypoglycemia, due to insulin.   Patient Instructions  Your blood pressure is high today.  Please see your primary care provider soon, to have it  rechecked. Please decrease the supper insulin to 18 units. Please continue the same 5 units with breakfast. check your blood sugar twice a day.  vary the time of day when you check, between before the 3 meals, and at bedtime.  also check if you have symptoms of your  blood sugar being too high or too low.  please keep a record of the readings and bring it to your next appointment here (or you can bring the meter itself).  You can write it on any piece of paper.  please call us sooner if your blood sugar goes below 70, or if you have a lot of readings over 200. Please come back for a follow-up appointment in 4 months.

## 2021-02-04 ENCOUNTER — Other Ambulatory Visit: Payer: Self-pay | Admitting: Physician Assistant

## 2021-02-18 NOTE — Progress Notes (Signed)
Remote pacemaker transmission.   

## 2021-03-03 DIAGNOSIS — N184 Chronic kidney disease, stage 4 (severe): Secondary | ICD-10-CM | POA: Diagnosis not present

## 2021-03-03 DIAGNOSIS — N390 Urinary tract infection, site not specified: Secondary | ICD-10-CM | POA: Diagnosis not present

## 2021-03-03 DIAGNOSIS — I129 Hypertensive chronic kidney disease with stage 1 through stage 4 chronic kidney disease, or unspecified chronic kidney disease: Secondary | ICD-10-CM | POA: Diagnosis not present

## 2021-03-03 DIAGNOSIS — D509 Iron deficiency anemia, unspecified: Secondary | ICD-10-CM | POA: Diagnosis not present

## 2021-03-03 DIAGNOSIS — I4891 Unspecified atrial fibrillation: Secondary | ICD-10-CM | POA: Diagnosis not present

## 2021-03-07 ENCOUNTER — Other Ambulatory Visit: Payer: Self-pay | Admitting: Family

## 2021-03-07 DIAGNOSIS — Z1231 Encounter for screening mammogram for malignant neoplasm of breast: Secondary | ICD-10-CM

## 2021-03-07 NOTE — Progress Notes (Signed)
Office Visit Note  Patient: [name]             Date of Birth: [birth year]           MRN: [id number]             PCP: [name], FNP Referring: [name],* Visit Date: 03/08/2021  Subjective:  New Patient (Initial Visit) (Patient complains of bilateral hand numbness and tingling (right hand>left hand), patient is right hand dominant. )   History of Present Illness: [name] is a 85 y.o. female with diastolic CHF, CKD stage 4, T2DM here for evaluation of joint pain for CPDD. She complains of bilateral hand stiffness and tingling sensations that are ongoing since about half a year ago. She does not recall any similar problems to this in the past. She denies pain specifically, just reiterates tingling sensation in the fingers seems to be worst around the right hand in 2nd-3rd digits but does seem to involve all fingers to some extent although not concurrently. 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 She does not notice a lot of swelling and never any associated discoloration. She does not report dropping items or loss of feeling. She has no numbness or paresthesias in legs and feet. She denies neck and shoulder pains during this time.  Labs reviewed 12/2020 RF 15 ANA neg ESR wnl eGFR 19  Activities of Daily Living:  Patient reports morning stiffness for 0 minutes.   Patient Denies nocturnal pain.  Difficulty dressing/grooming: Denies Difficulty climbing stairs: Denies Difficulty getting out of chair: Denies Difficulty using hands for taps, buttons, cutlery, and/or writing: Reports  Review of Systems  Constitutional: Negative for fatigue.  HENT: Negative for mouth sores, mouth dryness and nose dryness.   Eyes: Positive for visual disturbance. Negative for pain, itching and dryness.   Respiratory: Positive for cough, shortness of breath and difficulty breathing. Negative for hemoptysis.   Cardiovascular: Negative for chest pain, palpitations and swelling in legs/feet.  Gastrointestinal: Positive for constipation. Negative for abdominal pain, blood in stool and diarrhea.  Endocrine: Negative for increased urination.  Genitourinary: Negative for painful urination.  Musculoskeletal: Negative for arthralgias, joint pain, joint swelling, myalgias, muscle weakness, morning stiffness, muscle tenderness and myalgias.  Skin: Negative for color change, rash and redness.  Allergic/Immunologic: Negative for susceptible to infections.  Neurological: Positive for numbness, headaches and parasthesias. Negative for dizziness, memory loss and weakness.  Hematological: Negative for swollen glands.  Psychiatric/Behavioral: Negative for confusion and sleep disturbance.    PMFS History:  Patient Active Problem List   Diagnosis Date Noted  . Bilateral hand pain 03/08/2021  . Complete heart block ([place]) 12/13/2020  . Pacemaker - MDT 12/13/2020  . SOB (shortness of breath) 07/29/2020  . Pain in right knee 03/17/2020  . Chronic diastolic CHF (congestive heart failure) ([place])   . DKA (diabetic ketoacidoses) 12/20/2019  . Diverticulosis 12/10/2019  . Shortness of breath 01/30/2018  . Acute bronchiolitis 12/17/2017  . Wheezing 10/29/2017  . Vitamin D deficiency 07/31/2017  . Renal insufficiency 07/31/2017  . Knee pain 06/01/2017  . Morbid obesity due to excess calories ([place]) 01/25/2017  . Diabetes ([place]) 04/12/2016  . Cough 01/20/2015  . Atrial fibrillation ([place]) 05/28/2014  . Personal history of colonic polyps 05/28/2014  . Vomiting 01/29/2014  . CAD (coronary artery disease) 09/01/2013  . Routine general medical examination  at a health care facility 07/17/2011  . Encounter for long-term (current) use of other medications 07/06/2011  . Nonspecific (abnormal) findings on radiological  and other examination of body structure 11/09/2009  . IRRITABLE BOWEL SYNDROME 05/07/2009  . CHEST PAIN 05/07/2009  . ADNEXAL MASS, RIGHT 07/22/2008  . HEMORRHOIDS, RECURRENT 01/27/2008  . Diverticulitis 01/27/2008  . OTH ABNORMAL FIND RAD EXAMINATION BREAST 01/27/2008  . GERD 01/13/2008  . UTI 09/28/2007  . Dyslipidemia 05/27/2007  . ANEMIA-IRON DEFICIENCY 05/27/2007  . ANXIETY 05/27/2007  . Essential hypertension 05/27/2007  . Seasonal allergic rhinitis 05/27/2007  . Cough variant asthma 05/27/2007  . Osteoporosis 05/27/2007  . DIVERTICULITIS, HX OF 05/27/2007    Past Medical History:  Diagnosis Date  . Allergic rhinitis   . Anterior chest wall pain   . Anxiety   . Asthma   . Chronic diastolic CHF (congestive heart failure) (HCC)    Echo 01/2020: EF 60-65, normal wall motion, mild LVH, normal RV SF, RVSP 52.6 (moderate elevation), severe LAE, moderate RAE, trivial MR, mild MS (mean gradient 5.5 mmHg), mild aortic valve sclerosis (no AS); elevated E/e' c/w elevated LVEDP  . Cough   . DM type 2 (diabetes mellitus, type 2) ([place])   . GERD (gastroesophageal reflux disease)   . History of diverticulitis of colon   . HTN (hypertension)   . Hyperlipidemia   . IBS (irritable bowel syndrome)   . Iron deficiency anemia   . Osteoporosis, unspecified   . Renal insufficiency 07/31/2017  . UTI (urinary tract infection)     Family History  Problem Relation Age of Onset  . Lung cancer Brother   . Melanoma Brother   . Hypertension Mother   . Stroke Mother   . Other Father        poor circulation  . Cancer Sister   . Diabetes Daughter   . Atopy Neg Hx   . Asthma Neg Hx   . Breast cancer Neg Hx    Past Surgical History:  Procedure Laterality Date  . CARDIOVASCULAR STRESS TEST  02/25/04  . ESOPHAGOGASTRODUODENOSCOPY  12/26/01  . PACEMAKER IMPLANT N/A 08/02/2020   Procedure: PACEMAKER IMPLANT;  Surgeon: Deboraha Sprang, MD;  Location: [facility];  Service: Cardiovascular;   Laterality: N/A;  . RIGHT OOPHORECTOMY  jan 2010   Social History   Social History Narrative  . Not on file   Immunization History  Administered Date(s) Administered  . Fluad Quad(high Dose 65+) 06/12/2019, 10/08/2020  . Influenza Split 07/17/2011, 07/05/2012  . Influenza Whole 07/22/2008  . Influenza, High Dose Seasonal PF 07/08/2018, 06/12/2019  . Influenza,inj,Quad PF,6+ Mos 07/18/2013, 06/16/2014  . Influenza-Unspecified 07/17/2011, 07/05/2012, 07/18/2013, 06/16/2014, 07/08/2018  . PFIZER(Purple Top)SARS-COV-2 Vaccination 05/05/2020, 06/04/2020  . Pneumococcal Conjugate-13 04/12/2016  . Pneumococcal Polysaccharide-23 10/02/2006  . Td 08/02/1998     Objective: Vital Signs: BP (!) 143/69 (BP Location: Right Arm, Patient Position: Sitting, Cuff Size: Normal)   Pulse 69   Ht 5' 5.25" (1.657 m)   Wt 184 lb 9.6 oz (83.7 kg)   BMI 30.48 kg/m    Physical Exam HENT:     Right Ear: External ear normal.     Left Ear: External ear normal.     Mouth/Throat:     Mouth: Mucous membranes are moist.     Pharynx: Oropharynx is clear.  Eyes:     Conjunctiva/sclera: Conjunctivae normal.  Cardiovascular:     Rate and Rhythm: Normal rate and regular rhythm.  Pulmonary:  Effort: Pulmonary effort is normal.     Breath sounds: Normal breath sounds.  Skin:    General: Skin is warm and dry.     Comments: Numerous skin tags and probably seborrheic keratoses  Neurological:     General: No focal deficit present.     Mental Status: She is alert.     Comments: Negative tinel's, negative phalen's test  Psychiatric:        Mood and Affect: Mood normal.     Musculoskeletal Exam:  Neck full ROM no tenderness Shoulders full ROM no tenderness or swelling Elbows full ROM no tenderness or swelling Wrists full ROM no tenderness or swelling, bony prominence distal to wrist Fingers full ROM no tenderness or swelling, mild 1st CMC joint squaring, PIP joint enlargement, boutonniere deformity of  5th digits bilaterally No paraspinal tenderness to palpation over upper and lower back Knees full ROM no tenderness or swelling Ankles full ROM no tenderness or swelling   Investigation: No additional findings.  Imaging: No results found.  Recent Labs: Lab Results  Component Value Date   WBC 9.0 08/06/2020   HGB 12.4 08/06/2020   PLT 215 08/06/2020   NA 143 01/12/2021   K 4.0 01/12/2021   CL 107 01/12/2021   CO2 24 01/12/2021   GLUCOSE 107 (H) 01/12/2021   BUN 33 (H) 01/12/2021   CREATININE 2.28 (H) 01/12/2021   BILITOT 0.9 01/12/2021   ALKPHOS 74 01/12/2021   AST 18 01/12/2021   ALT 13 01/12/2021   PROT 7.2 01/12/2021   ALBUMIN 3.8 01/12/2021   CALCIUM 9.5 01/12/2021   GFRAA 28 (L) 08/06/2020    Speciality Comments: No specialty comments available.  Procedures:  No procedures performed Allergies: Aspirin and Ramipril   Assessment / Plan:     Visit Diagnoses: Bilateral hand pain - Plan: Ambulatory referral to Physical Medicine Rehab  Bilateral hand symptoms could be consistent with CPDD although no inflammation is appreciable on exam today including limited MSK ultrasound. No joint effusion for aspiration, no severe problem to warrant advanced imaging currently. There is osteoarthritis and there is severe osteopenia on imaging. I am suspicious for neurologic contribution based on the described particular symptom of intermittent tingling without any more typical pain description. Options for arthritis would be limited or higher risk on account of decreased kidney function and other current medications, so not recommending an empiric approach or any new treatment today. Will refer for nerve conduction study to better evaluate this and plan to follow up after this.  Orders: Orders Placed This Encounter  Procedures  . Ambulatory referral to Physical Medicine Rehab   No orders of the defined types were placed in this encounter.    Follow-Up Instructions: No  follow-ups on file.   Collier Salina, MD  Note - This record has been created using [employer].  Chart creation errors have been sought, but may not always  have been located. Such creation errors do not reflect on  the standard of medical care.

## 2021-03-08 ENCOUNTER — Other Ambulatory Visit: Payer: Self-pay

## 2021-03-08 ENCOUNTER — Encounter: Payer: Self-pay | Admitting: Internal Medicine

## 2021-03-08 ENCOUNTER — Ambulatory Visit (INDEPENDENT_AMBULATORY_CARE_PROVIDER_SITE_OTHER): Payer: Medicare Other | Admitting: Internal Medicine

## 2021-03-08 VITALS — BP 143/69 | HR 69 | Ht 65.25 in | Wt 184.6 lb

## 2021-03-08 DIAGNOSIS — M79642 Pain in left hand: Secondary | ICD-10-CM | POA: Diagnosis not present

## 2021-03-08 DIAGNOSIS — M79641 Pain in right hand: Secondary | ICD-10-CM | POA: Diagnosis not present

## 2021-03-12 ENCOUNTER — Ambulatory Visit: Payer: Medicare Other

## 2021-03-16 DIAGNOSIS — H401131 Primary open-angle glaucoma, bilateral, mild stage: Secondary | ICD-10-CM | POA: Diagnosis not present

## 2021-03-28 ENCOUNTER — Other Ambulatory Visit: Payer: Self-pay

## 2021-03-28 ENCOUNTER — Ambulatory Visit (INDEPENDENT_AMBULATORY_CARE_PROVIDER_SITE_OTHER): Payer: Medicare Other | Admitting: Podiatry

## 2021-03-28 DIAGNOSIS — L84 Corns and callosities: Secondary | ICD-10-CM | POA: Diagnosis not present

## 2021-03-28 DIAGNOSIS — M79675 Pain in left toe(s): Secondary | ICD-10-CM | POA: Diagnosis not present

## 2021-03-28 DIAGNOSIS — E1151 Type 2 diabetes mellitus with diabetic peripheral angiopathy without gangrene: Secondary | ICD-10-CM | POA: Diagnosis not present

## 2021-03-28 DIAGNOSIS — M79674 Pain in right toe(s): Secondary | ICD-10-CM

## 2021-03-28 DIAGNOSIS — B351 Tinea unguium: Secondary | ICD-10-CM

## 2021-03-28 DIAGNOSIS — Q828 Other specified congenital malformations of skin: Secondary | ICD-10-CM | POA: Diagnosis not present

## 2021-03-29 ENCOUNTER — Encounter: Payer: Self-pay | Admitting: Podiatry

## 2021-03-29 NOTE — Progress Notes (Signed)
Subjective: [name] is a pleasant 85 y.o. female patient seen today for at risk foot care with h/o NIDDM and PAD with corn right 5th digit, calluses left foot and painful thick toenails that are difficult to trim. Pain interferes with ambulation. Aggravating factors include wearing enclosed shoe gear. Pain is relieved with periodic professional debridement.  She states her blood glucose was 150 mg/dl yesterday morning.  PCP is [name], FNP. Last visit was: 01/12/2021.  Allergies  Allergen Reactions   Aspirin Shortness Of Breath and Other (See Comments)    Caused asthma symptoms   Ramipril Cough    Objective: Physical Exam  General: [name] is a pleasant 85 y.o. African American female, in NAD. AAO x 3.   Vascular:  Capillary refill time to digits immediate b/l lower extremities. Faintly palpable DP pulse(s) b/l lower extremities. Faintly palpable PT pulse(s) b/l lower extremities. Pedal hair absent. Lower extremity skin temperature gradient within normal limits. No pain with calf compression b/l.  Dermatological:  Pedal skin with normal turgor, texture and tone b/l lower extremities No open wounds b/l lower extremities No interdigital macerations b/l lower extremities Toenails 1-5 b/l elongated, discolored, dystrophic, thickened, crumbly with subungual debris and tenderness to dorsal palpation. Hyperkeratotic lesion(s) R 5th toe and sub 5th met base left foot.  No erythema, no edema, no drainage, no fluctuance. Porokeratotic lesion(s) plantar aspect of heel left foot. No erythema, no edema, no drainage, no fluctuance.  Musculoskeletal:  Normal muscle strength 5/5 to all lower extremity muscle groups bilaterally. No pain crepitus or joint limitation noted with ROM b/l. Hallux valgus with bunion deformity noted b/l lower extremities. Hammertoe(s) noted to the 2-5 bilaterally.  Neurological:  Protective sensation intact 5/5 intact bilaterally with 10g monofilament  b/l.  Assessment and Plan:  1. Pain due to onychomycosis of toenails of both feet   2. Corns and callosities   3. Porokeratosis   4. Type II diabetes mellitus with peripheral circulatory disorder (HCC)    -Examined patient. -Continue diabetic foot care principles. -Patient to continue soft, supportive shoe gear daily. -Toenails 1-5 b/l were debrided in length and girth with sterile nail nippers and dremel without iatrogenic bleeding.  -Corn(s) R 5th toe and callus(es) sub 5th met base left foot were pared utilizing sterile scalpel blade without incident. Total number debrided =2. -Painful porokeratotic lesion(s) plantar aspect of heel left foot pared and enucleated with sterile scalpel blade without incident. Total number of lesions debrided=1. -Patient to report any pedal injuries to medical professional immediately. -Patient/POA to call should there be question/concern in the interim.  Return in about 3 months (around 06/28/2021).  Marzetta Board, DPM

## 2021-03-30 DIAGNOSIS — M179 Osteoarthritis of knee, unspecified: Secondary | ICD-10-CM | POA: Insufficient documentation

## 2021-03-30 DIAGNOSIS — M1711 Unilateral primary osteoarthritis, right knee: Secondary | ICD-10-CM | POA: Diagnosis not present

## 2021-03-30 DIAGNOSIS — M170 Bilateral primary osteoarthritis of knee: Secondary | ICD-10-CM | POA: Diagnosis not present

## 2021-04-08 ENCOUNTER — Telehealth: Payer: Self-pay | Admitting: Endocrinology

## 2021-04-08 DIAGNOSIS — Z20822 Contact with and (suspected) exposure to covid-19: Secondary | ICD-10-CM | POA: Diagnosis not present

## 2021-04-08 NOTE — Telephone Encounter (Signed)
REFILL REQUEST FOR -   One touch verio test strips / and syringes  PHARMACY -   [id number] - [address]  [id number] [id number] Phone:  [phone] Fax:  [phone]

## 2021-04-10 ENCOUNTER — Other Ambulatory Visit: Payer: Self-pay

## 2021-04-11 ENCOUNTER — Other Ambulatory Visit: Payer: Self-pay

## 2021-04-11 DIAGNOSIS — Z794 Long term (current) use of insulin: Secondary | ICD-10-CM

## 2021-04-11 DIAGNOSIS — E1122 Type 2 diabetes mellitus with diabetic chronic kidney disease: Secondary | ICD-10-CM

## 2021-04-11 MED ORDER — "SYRINGE 25G X 1"" 3 ML MISC": 3 refills | Status: DC

## 2021-04-11 MED ORDER — GLUCOSE BLOOD VI STRP: ORAL_STRIP | 12 refills | Status: AC

## 2021-04-22 ENCOUNTER — Other Ambulatory Visit: Payer: Self-pay | Admitting: Internal Medicine

## 2021-04-22 NOTE — Telephone Encounter (Signed)
Pt last saw Dr Caryl Comes 12/20/20, last labs 01/12/21 Creat 2.28, age 85, weight 83.7, based on specified criteria pt is on appropriate dosage of Eliquis 2.'5mg'$  BID.  Will refill rx.

## 2021-04-29 ENCOUNTER — Encounter: Payer: Self-pay | Admitting: Physical Medicine and Rehabilitation

## 2021-04-29 ENCOUNTER — Other Ambulatory Visit: Payer: Self-pay

## 2021-04-29 ENCOUNTER — Ambulatory Visit (INDEPENDENT_AMBULATORY_CARE_PROVIDER_SITE_OTHER): Payer: Medicare Other | Admitting: Physical Medicine and Rehabilitation

## 2021-04-29 ENCOUNTER — Telehealth: Payer: Self-pay | Admitting: Family

## 2021-04-29 DIAGNOSIS — R202 Paresthesia of skin: Secondary | ICD-10-CM

## 2021-04-29 NOTE — Chronic Care Management (AMB) (Signed)
  Chronic Care Management   Outreach Note  04/29/2021 Name: ALISHBA LITZENBERG MRN: [id number] DOB: Feb 05, 1936  Referred by: [name], [place] Reason for referral : No chief complaint on file.   An unsuccessful telephone outreach was attempted today. The patient was referred to the pharmacist for assistance with care management and care coordination.   Follow Up Plan:   Lauretta Grill Upstream Scheduler

## 2021-04-29 NOTE — Progress Notes (Signed)
Tingling in fingers of both hands. Right is worse.  Right hand dominant +Lotion Numeric Pain Rating Scale and Functional Assessment Average Pain 8   In the last MONTH (on 0-10 scale) has pain interfered with the following?  1. General activity like being  able to carry out your everyday physical activities such as walking, climbing stairs, carrying groceries, or moving a chair?  Rating(3)

## 2021-05-02 ENCOUNTER — Ambulatory Visit (INDEPENDENT_AMBULATORY_CARE_PROVIDER_SITE_OTHER): Payer: Medicare Other

## 2021-05-02 DIAGNOSIS — I442 Atrioventricular block, complete: Secondary | ICD-10-CM

## 2021-05-02 LAB — CUP PACEART REMOTE DEVICE CHECK
Battery Remaining Longevity: 153 mo
Battery Voltage: 3.1 V
Brady Statistic RV Percent Paced: 99.43 %
Date Time Interrogation Session: 20220801023630
Implantable Lead Implant Date: 20211101
Implantable Lead Location: 753860
Implantable Lead Model: 3830
Implantable Pulse Generator Implant Date: 20211101
Lead Channel Impedance Value: 342 Ohm
Lead Channel Impedance Value: 418 Ohm
Lead Channel Pacing Threshold Amplitude: 1 V
Lead Channel Pacing Threshold Pulse Width: 0.4 ms
Lead Channel Sensing Intrinsic Amplitude: 10.375 mV
Lead Channel Sensing Intrinsic Amplitude: 10.375 mV
Lead Channel Setting Pacing Amplitude: 2 V
Lead Channel Setting Pacing Pulse Width: 0.4 ms
Lead Channel Setting Sensing Sensitivity: 0.9 mV

## 2021-05-03 ENCOUNTER — Other Ambulatory Visit: Payer: Self-pay | Admitting: Endocrinology

## 2021-05-03 DIAGNOSIS — E1122 Type 2 diabetes mellitus with diabetic chronic kidney disease: Secondary | ICD-10-CM

## 2021-05-03 NOTE — Procedures (Signed)
EMG & NCV Findings: Evaluation of the right median motor nerve showed reduced amplitude (4.8 mV).  The right median (across palm) sensory nerve showed prolonged distal peak latency (Wrist, 3.7 ms) and prolonged distal peak latency (Palm, 2.2 ms).  All remaining nerves (as indicated in the following tables) were within normal limits.  All left vs. right side differences were within normal limits.    All examined muscles (as indicated in the following table) showed no evidence of electrical instability.    Impression: The above electrodiagnostic study is ABNORMAL and reveals evidence of a mild right median nerve entrapment at the wrist (carpal tunnel syndrome) affecting sensory components. There is no significant electrodiagnostic evidence of any other focal nerve entrapment, brachial plexopathy or cervical radiculopathy.   Recommendations: 1.  Follow-up with referring physician. 2.  Continue current management of symptoms. 3.  Continue use of resting splint at night-time and as needed during the day.  ___________________________ Wonda Olds Board Certified, American Board of Physical Medicine and Rehabilitation    Nerve Conduction Studies Anti Sensory Summary Table   Stim Site NR Peak (ms) Norm Peak (ms) P-T Amp (V) Norm P-T Amp Site1 Site2 Delta-P (ms) Dist (cm) Vel (m/s) Norm Vel (m/s)  Left Median Acr Palm Anti Sensory (2nd Digit)  30.2C  Wrist    3.3 <3.6 21.2 >10 Wrist Palm 1.5 0.0    Palm    1.8 <2.0 6.6         Right Median Acr Palm Anti Sensory (2nd Digit)  29.8C  Wrist    *3.7 <3.6 18.2 >10 Wrist Palm 1.5 0.0    Palm    *2.2 <2.0 17.5         Right Radial Anti Sensory (Base 1st Digit)  30.3C  Wrist    2.0 <3.1 8.9  Wrist Base 1st Digit 2.0 0.0    Right Ulnar Anti Sensory (5th Digit)  30.3C  Wrist    3.5 <3.7 20.6 >15.0 Wrist 5th Digit 3.5 14.0 40 >38   Motor Summary Table   Stim Site NR Onset (ms) Norm Onset (ms) O-P Amp (mV) Norm O-P Amp Site1 Site2 Delta-0  (ms) Dist (cm) Vel (m/s) Norm Vel (m/s)  Left Median Motor (Abd Poll Brev)  30C  Wrist    3.9 <4.2 5.1 >5 Elbow Wrist 4.9 25.0 51 >50  Elbow    8.8  3.0         Right Median Motor (Abd Poll Brev)  30.2C  Wrist    3.8 <4.2 *4.8 >5 Elbow Wrist 4.6 24.0 52 >50  Elbow    8.4  4.5         Right Ulnar Motor (Abd Dig Min)  30.4C  Wrist    3.2 <4.2 8.1 >3 B Elbow Wrist 4.0 22.5 56 >53  B Elbow    7.2  7.2  A Elbow B Elbow 1.6 10.0 62 >53  A Elbow    8.8  7.0          EMG   Side Muscle Nerve Root Ins Act Fibs Psw Amp Dur Poly Recrt Int Fraser Din Comment  Right Abd Poll Brev Median C8-T1 Nml Nml Nml Nml Nml 0 Nml Nml   Right 1stDorInt Ulnar C8-T1 Nml Nml Nml Nml Nml 0 Nml Nml   Right PronatorTeres Median C6-7 Nml Nml Nml Nml Nml 0 Nml Nml   Right Biceps Musculocut C5-6 Nml Nml Nml Nml Nml 0 Nml Nml   Right Deltoid Axillary C5-6 Nml Nml  Nml Nml Nml 0 Nml Nml     Nerve Conduction Studies Anti Sensory Left/Right Comparison   Stim Site L Lat (ms) R Lat (ms) L-R Lat (ms) L Amp (V) R Amp (V) L-R Amp (%) Site1 Site2 L Vel (m/s) R Vel (m/s) L-R Vel (m/s)  Median Acr Palm Anti Sensory (2nd Digit)  30.2C  Wrist 3.3 *3.7 0.4 21.2 18.2 14.2 Wrist Palm     Palm 1.8 *2.2 0.4 6.6 17.5 62.3       Radial Anti Sensory (Base 1st Digit)  30.3C  Wrist  2.0   8.9  Wrist Base 1st Digit     Ulnar Anti Sensory (5th Digit)  30.3C  Wrist  3.5   20.6  Wrist 5th Digit  40    Motor Left/Right Comparison   Stim Site L Lat (ms) R Lat (ms) L-R Lat (ms) L Amp (mV) R Amp (mV) L-R Amp (%) Site1 Site2 L Vel (m/s) R Vel (m/s) L-R Vel (m/s)  Median Motor (Abd Poll Brev)  30C  Wrist 3.9 3.8 0.1 5.1 *4.8 5.9 Elbow Wrist 51 52 1  Elbow 8.8 8.4 0.4 3.0 4.5 33.3       Ulnar Motor (Abd Dig Min)  30.4C  Wrist  3.2   8.1  B Elbow Wrist  56   B Elbow  7.2   7.2  A Elbow B Elbow  62   A Elbow  8.8   7.0           Waveforms:

## 2021-05-03 NOTE — Progress Notes (Signed)
[name] - 84 y.o. female MRN [id number]  Date of birth: 01-08-1936  Office Visit Note: Visit Date: 04/29/2021 PCP: [name], FNP Referred by: [name],*  Subjective: Chief Complaint  Patient presents with   Right Hand - Numbness   Left Hand - Numbness   HPI:  [name] is a 85 y.o. female who comes in today at the request of Dr. Vernelle Emerald for electrodiagnostic study of the Bilateral upper extremities.  Patient is Right hand dominant.  She reports chronic worsening hand pain in general.  She does get some nondermatomal tingling in both hands.  This is in all the digits.  It is more right than the left.  She is diabetic with congestive heart failure and kidney disease.  She has not had prior electrodiagnostic studies.  She denies any frank neck pain or radicular symptoms.    ROS Otherwise per HPI.  Assessment & Plan: Visit Diagnoses:    ICD-10-CM   1. Paresthesia of skin  R20.2 NCV with EMG (electromyography)      Plan: Impression: The above electrodiagnostic study is ABNORMAL and reveals evidence of a mild right median nerve entrapment at the wrist (carpal tunnel syndrome) affecting sensory components. There is no significant electrodiagnostic evidence of any other focal nerve entrapment, brachial plexopathy or cervical radiculopathy.   Recommendations: 1.  Follow-up with referring physician. 2.  Continue current management of symptoms. 3.  Continue use of resting splint at night-time and as needed during the day.  Meds & Orders: No orders of the defined types were placed in this encounter.   Orders Placed This Encounter  Procedures   NCV with EMG (electromyography)    Follow-up: Return for Vernelle Emerald, MD.   Procedures: No procedures performed  EMG & NCV Findings: Evaluation of the right median motor nerve showed reduced amplitude (4.8 mV).  The right median (across palm) sensory nerve showed prolonged distal peak latency  (Wrist, 3.7 ms) and prolonged distal peak latency (Palm, 2.2 ms).  All remaining nerves (as indicated in the following tables) were within normal limits.  All left vs. right side differences were within normal limits.    All examined muscles (as indicated in the following table) showed no evidence of electrical instability.    Impression: The above electrodiagnostic study is ABNORMAL and reveals evidence of a mild right median nerve entrapment at the wrist (carpal tunnel syndrome) affecting sensory components. There is no significant electrodiagnostic evidence of any other focal nerve entrapment, brachial plexopathy or cervical radiculopathy.   Recommendations: 1.  Follow-up with referring physician. 2.  Continue current management of symptoms. 3.  Continue use of resting splint at night-time and as needed during the day.  ___________________________ Wonda Olds Board Certified, American Board of Physical Medicine and Rehabilitation    Nerve Conduction Studies Anti Sensory Summary Table   Stim Site NR Peak (ms) Norm Peak (ms) P-T Amp (V) Norm P-T Amp Site1 Site2 Delta-P (ms) Dist (cm) Vel (m/s) Norm Vel (m/s)  Left Median Acr Palm Anti Sensory (2nd Digit)  30.2C  Wrist    3.3 <3.6 21.2 >10 Wrist Palm 1.5 0.0    Palm    1.8 <2.0 6.6         Right Median Acr Palm Anti Sensory (2nd Digit)  29.8C  Wrist    *3.7 <3.6 18.2 >10 Wrist Palm 1.5 0.0    Palm    *2.2 <2.0 17.5  Right Radial Anti Sensory (Base 1st Digit)  30.3C  Wrist    2.0 <3.1 8.9  Wrist Base 1st Digit 2.0 0.0    Right Ulnar Anti Sensory (5th Digit)  30.3C  Wrist    3.5 <3.7 20.6 >15.0 Wrist 5th Digit 3.5 14.0 40 >38   Motor Summary Table   Stim Site NR Onset (ms) Norm Onset (ms) O-P Amp (mV) Norm O-P Amp Site1 Site2 Delta-0 (ms) Dist (cm) Vel (m/s) Norm Vel (m/s)  Left Median Motor (Abd Poll Brev)  30C  Wrist    3.9 <4.2 5.1 >5 Elbow Wrist 4.9 25.0 51 >50  Elbow    8.8  3.0         Right Median Motor  (Abd Poll Brev)  30.2C  Wrist    3.8 <4.2 *4.8 >5 Elbow Wrist 4.6 24.0 52 >50  Elbow    8.4  4.5         Right Ulnar Motor (Abd Dig Min)  30.4C  Wrist    3.2 <4.2 8.1 >3 B Elbow Wrist 4.0 22.5 56 >53  B Elbow    7.2  7.2  A Elbow B Elbow 1.6 10.0 62 >53  A Elbow    8.8  7.0          EMG   Side Muscle Nerve Root Ins Act Fibs Psw Amp Dur Poly Recrt Int Fraser Din Comment  Right Abd Poll Brev Median C8-T1 Nml Nml Nml Nml Nml 0 Nml Nml   Right 1stDorInt Ulnar C8-T1 Nml Nml Nml Nml Nml 0 Nml Nml   Right PronatorTeres Median C6-7 Nml Nml Nml Nml Nml 0 Nml Nml   Right Biceps Musculocut C5-6 Nml Nml Nml Nml Nml 0 Nml Nml   Right Deltoid Axillary C5-6 Nml Nml Nml Nml Nml 0 Nml Nml     Nerve Conduction Studies Anti Sensory Left/Right Comparison   Stim Site L Lat (ms) R Lat (ms) L-R Lat (ms) L Amp (V) R Amp (V) L-R Amp (%) Site1 Site2 L Vel (m/s) R Vel (m/s) L-R Vel (m/s)  Median Acr Palm Anti Sensory (2nd Digit)  30.2C  Wrist 3.3 *3.7 0.4 21.2 18.2 14.2 Wrist Palm     Palm 1.8 *2.2 0.4 6.6 17.5 62.3       Radial Anti Sensory (Base 1st Digit)  30.3C  Wrist  2.0   8.9  Wrist Base 1st Digit     Ulnar Anti Sensory (5th Digit)  30.3C  Wrist  3.5   20.6  Wrist 5th Digit  40    Motor Left/Right Comparison   Stim Site L Lat (ms) R Lat (ms) L-R Lat (ms) L Amp (mV) R Amp (mV) L-R Amp (%) Site1 Site2 L Vel (m/s) R Vel (m/s) L-R Vel (m/s)  Median Motor (Abd Poll Brev)  30C  Wrist 3.9 3.8 0.1 5.1 *4.8 5.9 Elbow Wrist 51 52 1  Elbow 8.8 8.4 0.4 3.0 4.5 33.3       Ulnar Motor (Abd Dig Min)  30.4C  Wrist  3.2   8.1  B Elbow Wrist  56   B Elbow  7.2   7.2  A Elbow B Elbow  62   A Elbow  8.8   7.0           Waveforms:               Clinical History: No specialty comments available.     Objective:  VS:  HT:    WT:  BMI:     BP:   HR: bpm  TEMP: ( )  RESP:  Physical Exam Musculoskeletal:        General: No swelling, tenderness or deformity.     Comments: Inspection reveals  osteoarthritic changes but no atrophy of the bilateral APB or FDI or hand intrinsics. There is no swelling, color changes, allodynia or dystrophic changes. There is 5 out of 5 strength in the bilateral wrist extension, finger abduction and long finger flexion. There is intact sensation to light touch in all dermatomal and peripheral nerve distributions.  There is a negative Phalen's test bilaterally. There is a negative Hoffmann's test bilaterally.  Skin:    General: Skin is warm and dry.     Findings: No erythema or rash.  Neurological:     General: No focal deficit present.     Mental Status: She is alert and oriented to person, place, and time.     Motor: No weakness or abnormal muscle tone.     Coordination: Coordination normal.  Psychiatric:        Mood and Affect: Mood normal.        Behavior: Behavior normal.     Imaging: No results found.

## 2021-05-26 NOTE — Progress Notes (Signed)
Remote pacemaker transmission.   

## 2021-06-09 ENCOUNTER — Ambulatory Visit: Payer: Medicare Other | Admitting: Endocrinology

## 2021-07-01 ENCOUNTER — Ambulatory Visit (INDEPENDENT_AMBULATORY_CARE_PROVIDER_SITE_OTHER): Payer: Medicare Other | Admitting: Podiatry

## 2021-07-01 ENCOUNTER — Encounter: Payer: Self-pay | Admitting: Podiatry

## 2021-07-01 ENCOUNTER — Other Ambulatory Visit: Payer: Self-pay

## 2021-07-01 DIAGNOSIS — B351 Tinea unguium: Secondary | ICD-10-CM

## 2021-07-01 DIAGNOSIS — M79674 Pain in right toe(s): Secondary | ICD-10-CM | POA: Diagnosis not present

## 2021-07-01 DIAGNOSIS — M79675 Pain in left toe(s): Secondary | ICD-10-CM

## 2021-07-01 DIAGNOSIS — L84 Corns and callosities: Secondary | ICD-10-CM

## 2021-07-01 DIAGNOSIS — E1151 Type 2 diabetes mellitus with diabetic peripheral angiopathy without gangrene: Secondary | ICD-10-CM

## 2021-07-01 DIAGNOSIS — Q828 Other specified congenital malformations of skin: Secondary | ICD-10-CM | POA: Diagnosis not present

## 2021-07-01 NOTE — Progress Notes (Signed)
Subjective: [name] is a pleasant 85 y.o. female patient seen today for at risk foot care with h/o NIDDM and PAD with corn right 5th digit, calluses left foot and painful thick toenails that are difficult to trim. Pain interferes with ambulation. Aggravating factors include wearing enclosed shoe gear. Pain is relieved with periodic professional debridement.  She states her blood glucose was 167 mg/dl yesterday morning.  PCP is [name], FNP. Last visit was: 01/12/2021.  Allergies  Allergen Reactions   Aspirin Shortness Of Breath and Other (See Comments)    Caused asthma symptoms   Ramipril Cough   Objective: Physical Exam  General: [name] is a pleasant 85 y.o. African American female, in NAD. AAO x 3.   Vascular:  Capillary refill time to digits immediate b/l lower extremities. Faintly palpable DP pulse(s) b/l lower extremities. Faintly palpable PT pulse(s) b/l lower extremities. Pedal hair absent. Lower extremity skin temperature gradient within normal limits. No pain with calf compression b/l.  Dermatological:  Pedal skin with normal turgor, texture and tone b/l lower extremities No open wounds b/l lower extremities No interdigital macerations b/l lower extremities Toenails 1-5 b/l elongated, discolored, dystrophic, thickened, crumbly with subungual debris and tenderness to dorsal palpation. Hyperkeratotic lesion(s) R 5th toe and sub 5th met base left foot.  No erythema, no edema, no drainage, no fluctuance. Porokeratotic lesion(s) plantar aspect of heel left foot. No erythema, no edema, no drainage, no fluctuance.  Musculoskeletal:  Normal muscle strength 5/5 to all lower extremity muscle groups bilaterally. No pain crepitus or joint limitation noted with ROM b/l. Hallux valgus with bunion deformity noted b/l lower extremities. Hammertoe(s) noted to the 2-5 bilaterally.  Neurological:  Protective sensation intact 5/5 intact bilaterally with 10g monofilament  b/l.  Assessment and Plan:  1. Pain due to onychomycosis of toenails of both feet   2. Corns and callosities   3. Porokeratosis   4. Type II diabetes mellitus with peripheral circulatory disorder (HCC)    -No new findings. No new orders. -Medicaid ABN signed for this year. Patient consents for services of paring of corns/calluses today. Copy has been placed in patient chart. -Continue diabetic foot care principles: inspect feet daily, monitor glucose as recommended by PCP and/or Endocrinologist, and follow prescribed diet per PCP, Endocrinologist and/or dietician. -Patient to continue soft, supportive shoe gear daily. -Toenails 1-5 b/l were debrided in length and girth with sterile nail nippers and dremel without iatrogenic bleeding.  -Corn(s) R 5th toe and callus(es) submet head 5 right foot and sub 5th met base left foot were pared utilizing sterile scalpel blade without incident. Total number debrided =3. -Painful porokeratotic lesion(s) plantar aspect of heel left foot pared and enucleated with sterile scalpel blade without incident. Total number of lesions debrided=1. -Patient to report any pedal injuries to medical professional immediately. -Patient/POA to call should there be question/concern in the interim.  Return in about 3 months (around 09/30/2021).  Marzetta Board, DPM

## 2021-07-04 ENCOUNTER — Other Ambulatory Visit: Payer: Self-pay

## 2021-07-04 ENCOUNTER — Ambulatory Visit (INDEPENDENT_AMBULATORY_CARE_PROVIDER_SITE_OTHER): Payer: Medicare Other | Admitting: Internal Medicine

## 2021-07-04 ENCOUNTER — Encounter: Payer: Self-pay | Admitting: Internal Medicine

## 2021-07-04 DIAGNOSIS — J45991 Cough variant asthma: Secondary | ICD-10-CM

## 2021-07-04 MED ORDER — PREDNISONE 10 MG PO TABS: ORAL_TABLET | ORAL | 0 refills | Status: DC

## 2021-07-04 MED ORDER — AZITHROMYCIN 250 MG PO TABS: ORAL_TABLET | ORAL | 0 refills | Status: DC

## 2021-07-04 NOTE — Progress Notes (Signed)
Subjective:  Patient ID: [name], female   DOB: Jun 27, 1936 .   MRN: [id number]  Brief patient profile:  9 yobf quit smoking 1988 with h/o asthma  And nl baseline pfts ( as of 01/04/2010) who had been using Advair on a p.r.n. basis and noticing increasing dyspnea and need for rescue therapy x one mo when seen 01/06/08 for pulmonary evaluation so Advair stopped. Began Symbicort 160/4.25mcg 2 puffs two times a day.  Returned 02/13/08 improved with decreased dyspnea and no rescue use   Returned 03/18/08 symptom free no rescue needed, stopped reglan, no flare   05/04/2020  f/u ov/[name] re: asthma/ rhinitis not sure about ppi/ using benadryl helps some  Chief Complaint  Patient presents with   Follow-up    Increased SOB and chest tightness over the past month. She has some cough- prod with min light yellow sputum. She is using her albuterol inhaler about 2 x per day.   Dyspnea:  MMRC2 = can't walk a nl pace on a flat grade s sob but does fine slow and flat  Cough: more cough assoc with pnds day > noct/ not taking omeprazole  Sleeping: 2 pillows ok  SABA use: 2x daily  02: none  rec Plan A = Automatic = Always=    Symbicort 160 Take 2 puffs first thing in am and then another 2 puffs about 12 hours later.  Work on inhaler technique:    Omerprazole 40 mg Take 30-60 min before first meal of the day  Prednisone 10 mg take  4 each am x 2 days,   2 each am x 2 days,  1 each am x 2 days and stop  Plan B = Backup (to supplement plan A, not to replace it) Only use your albuterol inhaler as a rescue medication  Please schedule vaccine > pfizer 2nd shot on 06/04/20       07/04/2021  f/u ov/[name] re: cough variant asthma   maint on symbicort 160 2bid/ singulair  - no longer on gerd rx  Chief Complaint  Patient presents with   Follow-up    Still coughing some   Dyspnea:  baseline still MMRC2 = can't walk a nl pace on a flat grade s sob but does fine slow and flat eg Mall walking  Cough: worse x one week, slt  yellow not on gerd rx  Sleeping: flat bed/ 2 pillows- no noct symptoms SABA use: started up using it one week prior to OV  twice daily  02: none  Covid status:   2 vax / never got covid    No obvious day to day or daytime variability or assoc excess/ purulent sputum or mucus plugs or hemoptysis or cp or chest tightness, subjective wheeze or overt sinus or hb symptoms.   Sleeping  without nocturnal  or early am exacerbation  of respiratory  c/o's or need for noct saba. Also denies any obvious fluctuation of symptoms with weather or environmental changes or other aggravating or alleviating factors except as outlined above   No unusual exposure hx or h/o childhood pna/ asthma or knowledge of premature birth.  Current Allergies, Complete Past Medical History, Past Surgical History, Family History, and Social History were reviewed in [employer] record.  ROS  The following are not active complaints unless bolded Hoarseness, sore throat, dysphagia, dental problems, itching, sneezing,  nasal congestion or discharge of excess mucus or purulent secretions, ear ache,   fever, chills, sweats, unintended wt loss  or wt gain, classically pleuritic or exertional cp,  orthopnea pnd or arm/hand swelling  or leg swelling, presyncope, palpitations, abdominal pain, anorexia, nausea, vomiting, diarrhea  or change in bowel habits or change in bladder habits, change in stools or change in urine, dysuria, hematuria,  rash, arthralgias, visual complaints, headache, numbness, weakness or ataxia or problems with walking or coordination,  change in mood or  memory.        Current Meds  Medication Sig   acetaminophen (TYLENOL) 325 MG tablet Take 650 mg by mouth every 6 (six) hours as needed for pain.   amLODipine (NORVASC) 5 MG tablet TAKE 1 TABLET BY MOUTH TWICE A DAY   Blood Glucose Monitoring Suppl (ONE TOUCH ULTRA 2) W/DEVICE KIT Check blood sugar 3 times per day.   budesonide-formoterol  (SYMBICORT) 160-4.5 MCG/ACT inhaler TAKE 2 PUFFS FIRST THING IN AM AND THEN ANOTHER 2 PUFFS ABOUT 12 HOURS LATER.   cetirizine (ZYRTEC ALLERGY) 10 MG tablet Take 1 tablet (10 mg total) by mouth daily as needed for allergies.   diclofenac Sodium (VOLTAREN) 1 % GEL as needed.   ELIQUIS 2.5 MG TABS tablet TAKE 1 TABLET BY MOUTH TWICE A DAY   furosemide (LASIX) 40 MG tablet TAKE 1 TABLET BY MOUTH EVERY DAY (Patient taking differently: as needed.)   glucose blood test strip Check blood sugar twice a day   hydrALAZINE (APRESOLINE) 25 MG tablet TAKE 3 TABLETS (75 MG TOTAL) BY MOUTH 3 (THREE) TIMES DAILY. (Patient taking differently: Take 50 mg by mouth 3 (three) times daily.)   insulin lispro (HUMALOG) 100 UNIT/ML injection 5 UNITS WITH BREAKFAST, AND 22 UNITS WITH SUPPER   latanoprost (XALATAN) 0.005 % ophthalmic solution Place 1 drop into both eyes nightly.   montelukast (SINGULAIR) 10 MG tablet TAKE 1 TABLET BY MOUTH AT BEDTIME   omeprazole (PRILOSEC) 40 MG capsule Take 1 capsule (40 mg total) by mouth daily.   potassium chloride (KLOR-CON) 10 MEQ tablet Take 1 tablet (10 mEq total) by mouth daily.   pravastatin (PRAVACHOL) 40 MG tablet TAKE 1 TABLET BY MOUTH EVERY DAY   STUDY - ASPIRE - apixaban 2.5 mg or placebo tablet (PI-Sethi) 1 tablet 2 TIMES DAILY (route: oral)   Syringe/Needle, Disp, (SYRINGE 3CC/25GX1") 25G X 1" 3 ML MISC Use to inject into the skin 2x a day                         Past Medical History:   OSTEOPOROSIS (ICD-733.00)  HYPERTENSION (ICD-401.9)  HYPERLIPIDEMIA (ICD-272.4)  DIVERTICULITIS, HX OF (ICD-V12.79)  DIABETES MELLITUS, TYPE II (ICD-250.00)  COLON CANCER, HX OF (ICD-V10.05)  ASTHMA (ICD-493.90)  -PFT's 03/18/08 minimal airflow obstruction  -PFT's January 04, 2010 No sign airflow obstruction  -Mastered HFA technique August 13, 2008 > confimed November 23, 2009  ANXIETY (ICD-300.00)  ANEMIA-IRON DEFICIENCY (ICD-280.9)  ALLERGIC RHINITIS (ICD-477.9)  IBS   GERD            Objective:   Physical Exam  07/04/2021   182 11/04/2020     181 05/04/2020    185 05/05/2019    196  wt 202 November 23, 2009> 199 January 04, 2010 > 209  03/22/11 > 214 10/16/2013 >  01/25/2017   193> 01/28/2018   200    Vital signs reviewed  07/04/2021  - Note at rest 02 sats  99% on RA   General appearance:    amb slt hoarse pleasant bf nad with mild pseudowheeze  HEENT : pt wearing mask not removed for exam due to covid -19 concerns.    NECK :  without JVD/Nodes/TM/ nl carotid upstrokes bilaterally   LUNGS: no acc muscle use,  Nl contour chest with trace late exp wheeze  bilaterally without cough on insp or exp maneuvers   CV:  RRR  no s3 or murmur or increase in P2, and no edema   ABD:  soft and nontender with nl inspiratory excursion in the supine position. No bruits or organomegaly appreciated, bowel sounds nl  MS:  Nl gait/ ext warm without deformities, calf tenderness, cyanosis or clubbing No obvious joint restrictions   SKIN: warm and dry without lesions    NEURO:  alert, approp, nl sensorium with  no motor or cerebellar deficits apparent.              Assessment:

## 2021-07-04 NOTE — Patient Instructions (Addendum)
At onset cough the 1st thing you should do :  Try prilosec otc '20mg'$   Take 30-60 min before first meal of the day and Pepcid ac (famotidine) 20 mg one @  bedtime until cough is completely gone for at least a week without the need for cough suppression   Prednisone 10 mg take  4 each am x 2 days,   2 each am x 2 days,  1 each am x 2 days and stop   Zpak    Please schedule a follow up visit in 6  months but call sooner if needed - check location

## 2021-07-05 ENCOUNTER — Other Ambulatory Visit: Payer: Self-pay | Admitting: *Deleted

## 2021-07-05 ENCOUNTER — Encounter: Payer: Self-pay | Admitting: Internal Medicine

## 2021-07-05 MED ORDER — OMEPRAZOLE 40 MG PO CPDR: 40.0000 mg | DELAYED_RELEASE_CAPSULE | Freq: Every day | ORAL | 11 refills | Status: DC

## 2021-07-05 NOTE — Assessment & Plan Note (Addendum)
PFT's 03/18/08 minimal airflow obstruction  -PFT's January 04, 2010 No sign airflow obstruction   - FENO 01/25/2017  =   23 p am symb 160 x 2 pffs - Spirometry 01/25/2017  wnl x for min curvature on f/v p am symb 160 x 2 / no saba prior - 07/30/2017   After extensive coaching HFA effectiveness =    75% from baseline 50%  - Allergy profile 07/30/17  >  Eos 0.2 /  IgE  145  RAST pos mold  - FENO 01/28/2018  =   17 - Spirometry 01/28/2018  FEV1 1.22 (65%)  Ratio 79   - 07/04/2021  After extensive coaching inhaler device,  effectiveness =  75% from baseline 25%    At baseline > All goals of chronic asthma control met including optimal function and elimination of symptoms with minimal need for rescue therapy.  Contingencies discussed in full including contacting this office immediately if not controlling the symptoms using the rule of two's.     Needs to remember gerd rx at onset of any flare in future  For now / rx zpak/pred x 6 day taper and f/u in 6 m, sooner if needed          Each maintenance medication was reviewed in detail including emphasizing most importantly the difference between maintenance and prns and under what circumstances the prns are to be triggered using an action plan format where appropriate.  Total time for H and P, chart review, counseling, reviewing hfa device(s) and generating customized AVS unique to this office visit / same day charting = 20 min

## 2021-07-06 IMAGING — CT CT ANGIO CHEST
2 of 8 series · 19 of 46 positions shown · IV contrast (omnipaque)
Comparison: Chest x-ray dated 12/19/2019 and chest CT dated
09/16/2018

CLINICAL DATA: Chest pain and increased shortness of breath.
Elevated D-dimer.

EXAM:
CT ANGIOGRAPHY CHEST WITH CONTRAST
TECHNIQUE: Multidetector CT imaging of the chest was performed using the
standard protocol during bolus administration of intravenous
contrast. Multiplanar CT image reconstructions and MIPs were
obtained to evaluate the vascular anatomy.
CONTRAST:  65mL OMNIPAQUE IOHEXOL 350 MG/ML SOLN

[Series 5: thins · axial · 0.65mm/px · z∈[-311,-48]mm · 16 of 297 slices shown]
[im 17/297  lung]
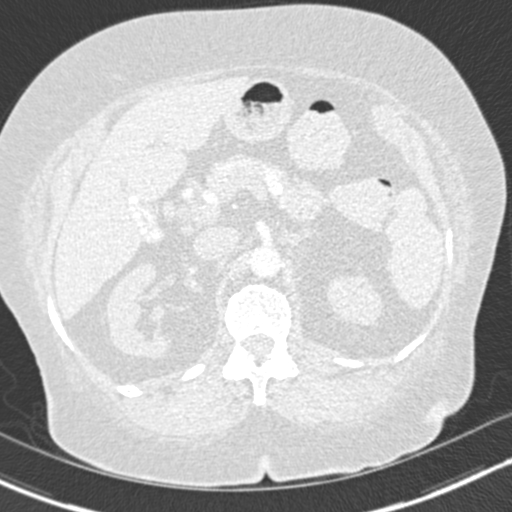
[im 33/297  soft-tissue]
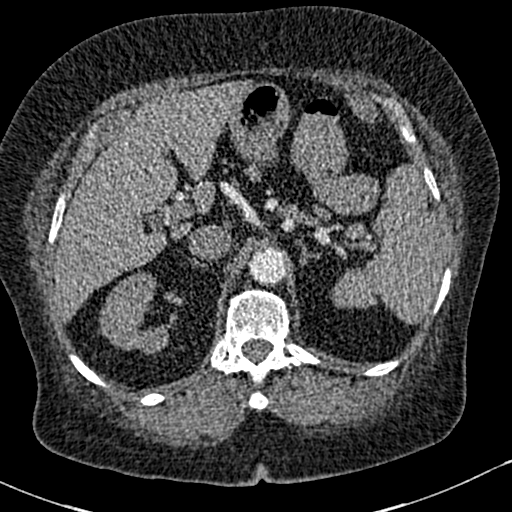
[im 50/297  lung]
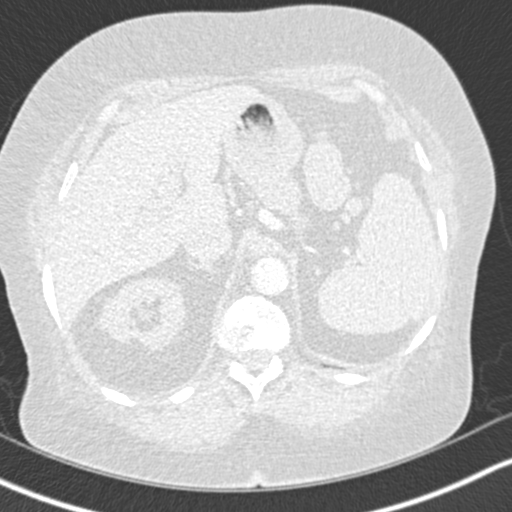
[im 66/297  soft-tissue]
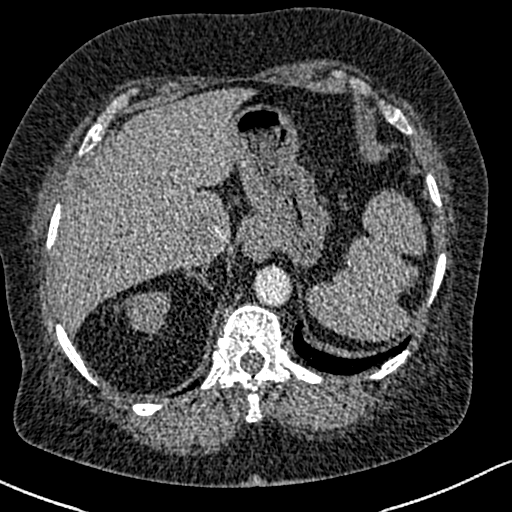
[im 83/297  lung]
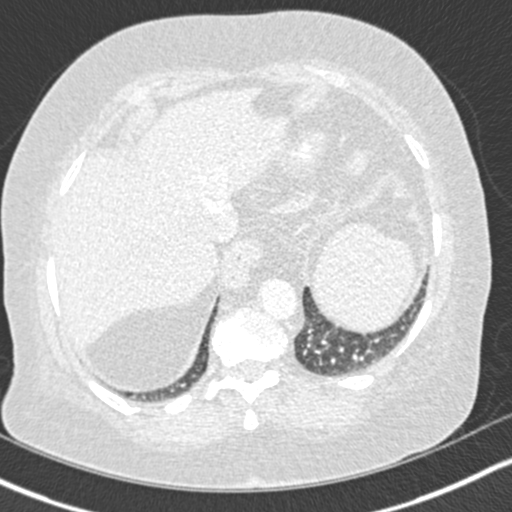
[im 99/297  soft-tissue]
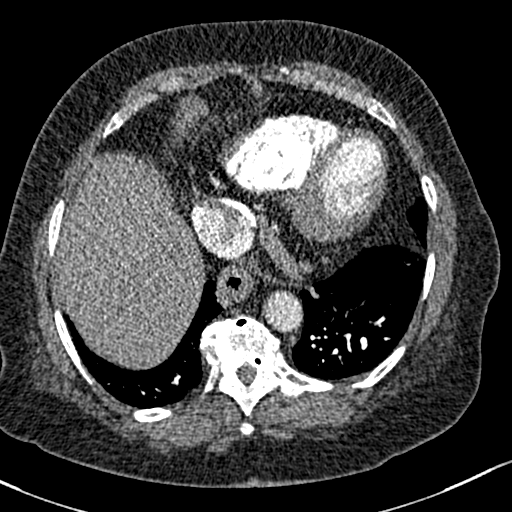
[im 116/297  lung]
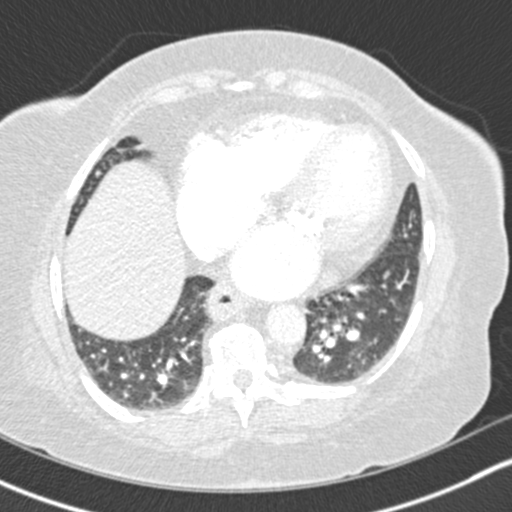
[im 132/297  soft-tissue]
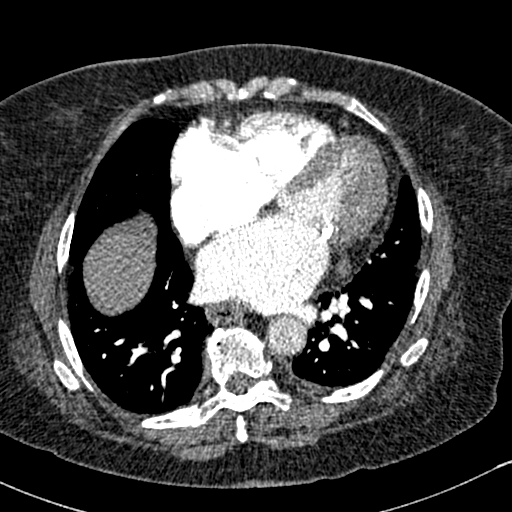
[im 165/297  lung]
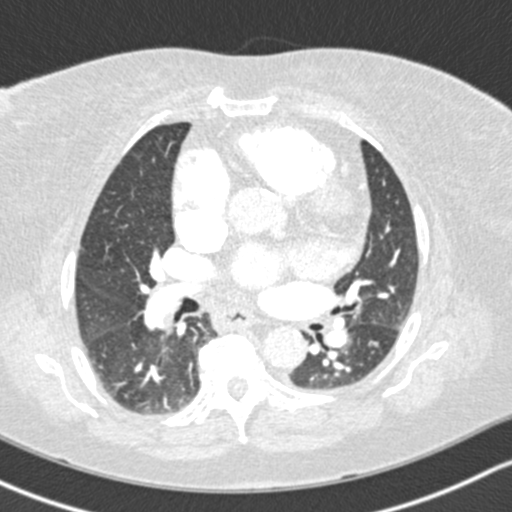
[im 181/297  soft-tissue]
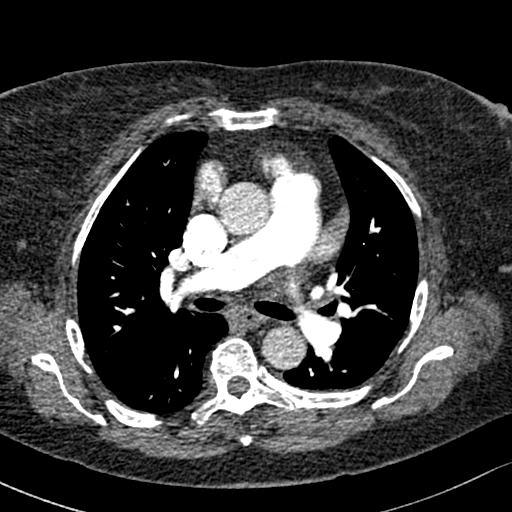
[im 198/297  lung]
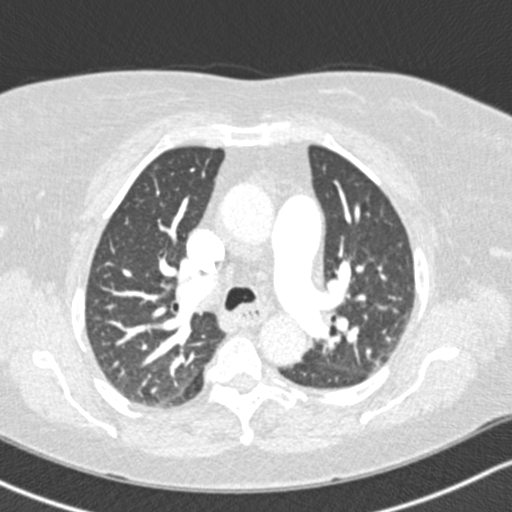
[im 214/297  soft-tissue]
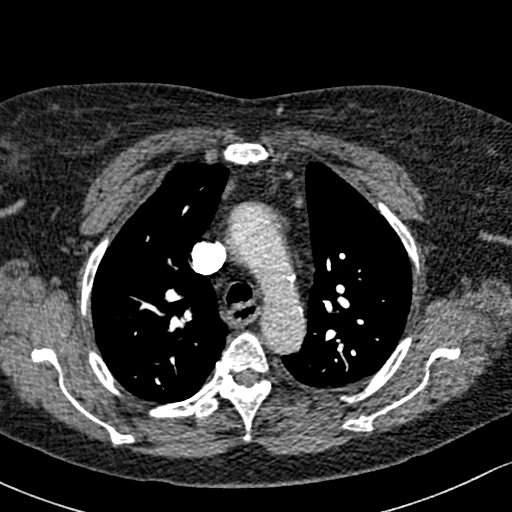
[im 231/297  lung]
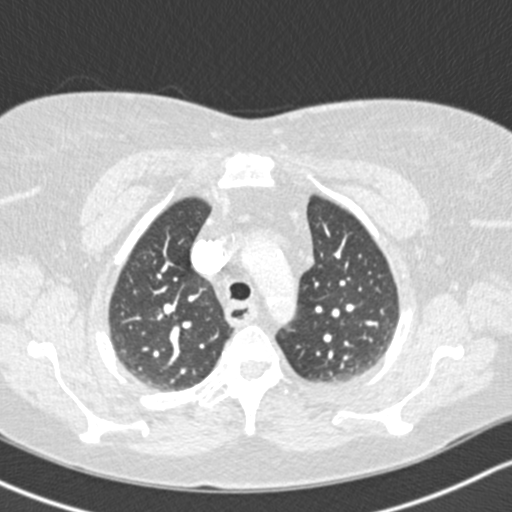
[im 247/297  soft-tissue]
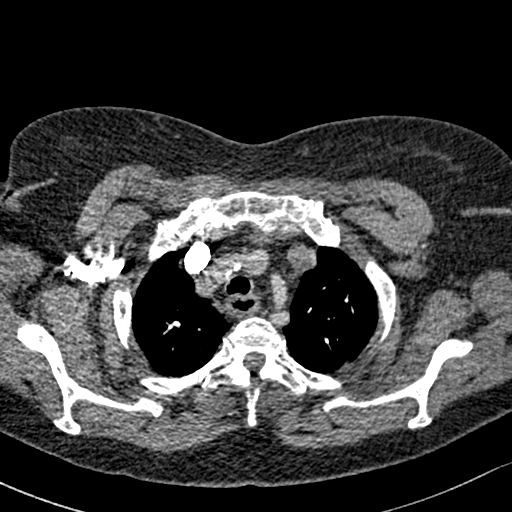
[im 264/297  lung]
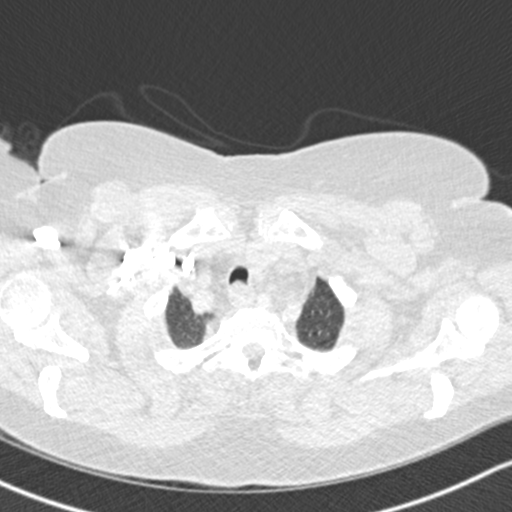
[im 280/297  soft-tissue]
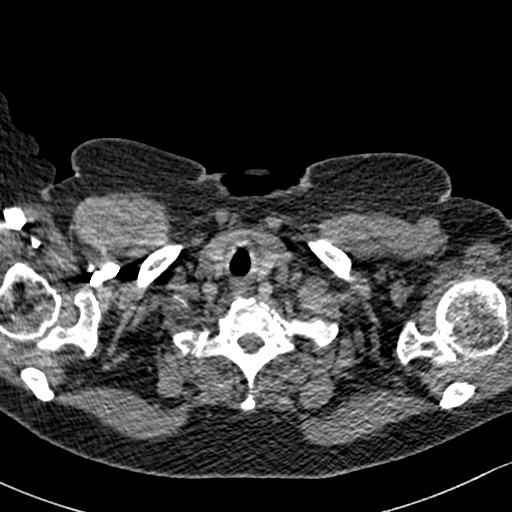

[Series 7: coronal mpr · coronal · 0.59mm/px · 3 of 123 slices shown]
[im 31/123  soft-tissue]
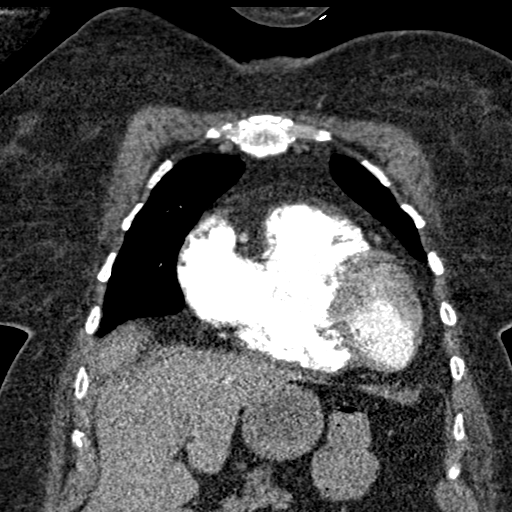
[im 62/123  soft-tissue]
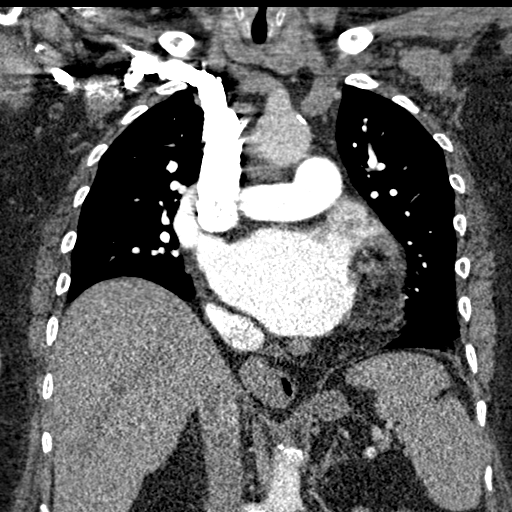
[im 92/123  soft-tissue]
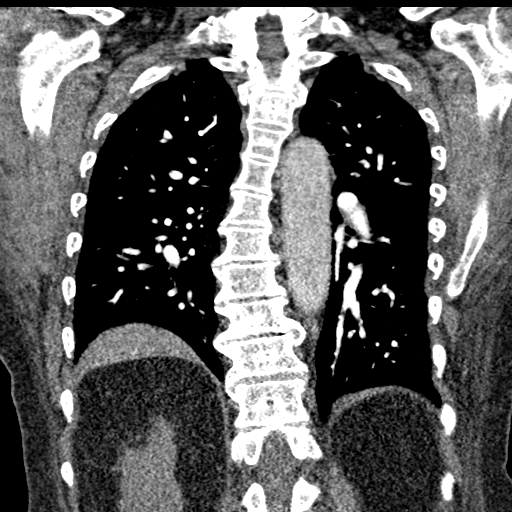

[19 of 46 positions shown; findings below may reference images not displayed]

FINDINGS: Cardiovascular: no pulmonary emboli. Cardiomegaly. No pericardial
effusion. Aortic atherosclerosis.

Mediastinum/Nodes: Small hiatal hernia. Diffuse slight thickening of
the mucosa of the esophagus, nonspecific. No adenopathy. Thyroid
gland and trachea appear normal.

Lungs/Pleura: Lungs are clear. No pleural effusion or pneumothorax.

Upper Abdomen: Cholelithiasis. No acute abnormality.

Musculoskeletal: Stable 10 mm nodule in the right breast. No other
chest wall abnormality. No acute or significant osseous findings.

Review of the MIP images confirms the above findings.
IMPRESSION: 1. No pulmonary emboli or other acute abnormality of the chest.
2. Cardiomegaly.
3. Cholelithiasis.
4. Small hiatal hernia.
5. Diffuse slight thickening of the mucosa of the esophagus,
nonspecific.

Aortic Atherosclerosis ([id number].L).

## 2021-07-13 MED ORDER — BUDESONIDE-FORMOTEROL FUMARATE 160-4.5 MCG/ACT IN AERO: INHALATION_SPRAY | RESPIRATORY_TRACT | 5 refills | Status: DC

## 2021-07-13 NOTE — Addendum Note (Signed)
Addended by: Dessie Coma on: 07/13/2021 11:33 AM   Modules accepted: Orders

## 2021-07-15 ENCOUNTER — Ambulatory Visit
Admission: RE | Admit: 2021-07-15 | Discharge: 2021-07-15 | Disposition: A | Discharge status: Home / Self Care | Payer: Medicare Other | Source: Ambulatory Visit | Attending: Family | Admitting: Family

## 2021-07-15 ENCOUNTER — Other Ambulatory Visit: Payer: Self-pay

## 2021-07-15 DIAGNOSIS — Z1231 Encounter for screening mammogram for malignant neoplasm of breast: Secondary | ICD-10-CM

## 2021-07-27 ENCOUNTER — Other Ambulatory Visit: Payer: Self-pay | Admitting: Endocrinology

## 2021-07-27 DIAGNOSIS — N183 Chronic kidney disease, stage 3 unspecified: Secondary | ICD-10-CM

## 2021-07-28 ENCOUNTER — Encounter: Payer: Self-pay | Admitting: Internal Medicine

## 2021-07-28 ENCOUNTER — Ambulatory Visit (INDEPENDENT_AMBULATORY_CARE_PROVIDER_SITE_OTHER): Payer: Medicare Other | Admitting: Internal Medicine

## 2021-07-28 ENCOUNTER — Other Ambulatory Visit: Payer: Self-pay

## 2021-07-28 VITALS — BP 138/60 | HR 75 | Ht 67.0 in | Wt 186.4 lb

## 2021-07-28 DIAGNOSIS — I4891 Unspecified atrial fibrillation: Secondary | ICD-10-CM

## 2021-07-28 DIAGNOSIS — I5032 Chronic diastolic (congestive) heart failure: Secondary | ICD-10-CM | POA: Diagnosis not present

## 2021-07-28 DIAGNOSIS — Z79899 Other long term (current) drug therapy: Secondary | ICD-10-CM

## 2021-07-28 LAB — CBC
Hematocrit: 33 % — ABNORMAL LOW (ref 34.0–46.6)
Hemoglobin: 11 g/dL — ABNORMAL LOW (ref 11.1–15.9)
MCH: 26.7 pg (ref 26.6–33.0)
MCHC: 33.3 g/dL (ref 31.5–35.7)
MCV: 80 fL (ref 79–97)
Platelets: 162 10*3/uL (ref 150–450)
RBC: 4.12 x10E6/uL (ref 3.77–5.28)
RDW: 15.6 % — ABNORMAL HIGH (ref 11.7–15.4)
WBC: 7 10*3/uL (ref 3.4–10.8)

## 2021-07-28 LAB — LIPID PANEL
Chol/HDL Ratio: 2.1 ratio (ref 0.0–4.4)
Cholesterol, Total: 135 mg/dL (ref 100–199)
HDL: 64 mg/dL (ref >39)
LDL Chol Calc (NIH): 58 mg/dL (ref 0–99)
Triglycerides: 62 mg/dL (ref 0–149)
VLDL Cholesterol Cal: 13 mg/dL (ref 5–40)

## 2021-07-28 LAB — BASIC METABOLIC PANEL
BUN/Creatinine Ratio: 11 — ABNORMAL LOW (ref 12–28)
BUN: 19 mg/dL (ref 8–27)
CO2: 17 mmol/L — ABNORMAL LOW (ref 20–29)
Calcium: 9.1 mg/dL (ref 8.7–10.3)
Chloride: 108 mmol/L — ABNORMAL HIGH (ref 96–106)
Creatinine, Ser: 1.77 mg/dL — ABNORMAL HIGH (ref 0.57–1.00)
Glucose: 112 mg/dL — ABNORMAL HIGH (ref 70–99)
Potassium: 4.1 mmol/L (ref 3.5–5.2)
Sodium: 141 mmol/L (ref 134–144)
eGFR: 28 mL/min/{1.73_m2} — ABNORMAL LOW (ref >59)

## 2021-07-28 NOTE — Patient Instructions (Signed)
Medication Instructions:  Your physician recommends that you continue on your current medications as directed. Please refer to the Current Medication list given to you today.  *If you need a refill on your cardiac medications before your next appointment, please call your pharmacy*   Lab Work: Lipid, Bmet, Cbc today  If you have labs (blood work) drawn today and your tests are completely normal, you will receive your results only by: [place] (if you have MyChart) OR A paper copy in the mail If you have any lab test that is abnormal or we need to change your treatment, we will call you to review the results.   Testing/Procedures: Your physician has requested that you have an echocardiogram. Echocardiography is a painless test that uses sound waves to create images of your heart. It provides your doctor with information about the size and shape of your heart and how well your heart's chambers and valves are working. This procedure takes approximately one hour. There are no restrictions for this procedure.    Follow-Up: At [facility], you and your health needs are our priority.  As part of our continuing mission to provide you with exceptional heart care, we have created designated Provider Care Teams.  These Care Teams include your primary Cardiologist (physician) and Advanced Practice Providers (APPs -  Physician Assistants and Nurse Practitioners) who all work together to provide you with the care you need, when you need it.  We recommend signing up for the patient portal called "MyChart".  Sign up information is provided on this After Visit Summary.  MyChart is used to connect with patients for Virtual Visits (Telemedicine).  Patients are able to view lab/test results, encounter notes, upcoming appointments, etc.  Non-urgent messages can be sent to your provider as well.   To learn more about what you can do with MyChart, go to [email or web address].    Your next appointment:    1 year(s)  The format for your next appointment:   In Person  Provider:   Dorris Carnes, MD   Other Instructions

## 2021-07-28 NOTE — Progress Notes (Signed)
Cardiology Office Note   Date:  07/28/2021   ID:  [name], DOB 1936-07-15, MRN [id number]  PCP:  [name], FNP  Cardiologist:   Dorris Carnes, MD   F/U of PAF, CAD and HTN     History of Present Illness: [name] is a 85 y.o. female with a history of HTN, DM, mod CAD (CT in 2017 with calcium scoreof  51  mod dz LAD; filling defect in LAA), atrial fibrillation  The pt was hospitalized in 2021 with dyspnea.  Found to be in CHB   She is s/p PPM implant    [facility] felt better after implant Followed by Olin Pia  He saw her in March 2022  Patient says she has been doing good   She says her breathing is OK   She denies CP   No dizziness   BP is OK     Walking  some   Current Meds  Medication Sig   acetaminophen (TYLENOL) 325 MG tablet Take 650 mg by mouth every 6 (six) hours as needed for pain.   albuterol (PROAIR HFA) 108 (90 Base) MCG/ACT inhaler Inhale 2 puffs into the lungs every 4 (four) hours as needed for wheezing.   amLODipine (NORVASC) 5 MG tablet TAKE 1 TABLET BY MOUTH TWICE A DAY   BD INSULIN SYRINGE U/F 31G X 5/16" 1 ML MISC Inject into the skin 2 (two) times daily.   Blood Glucose Monitoring Suppl (ONE TOUCH ULTRA 2) W/DEVICE KIT Check blood sugar 3 times per day.   budesonide-formoterol (SYMBICORT) 160-4.5 MCG/ACT inhaler TAKE 2 PUFFS FIRST THING IN AM AND THEN ANOTHER 2 PUFFS ABOUT 12 HOURS LATER.   cetirizine (ZYRTEC ALLERGY) 10 MG tablet Take 1 tablet (10 mg total) by mouth daily as needed for allergies.   ELIQUIS 2.5 MG TABS tablet TAKE 1 TABLET BY MOUTH TWICE A DAY   furosemide (LASIX) 40 MG tablet TAKE 1 TABLET BY MOUTH EVERY DAY (Patient taking differently: as needed.)   glucose blood test strip Check blood sugar twice a day   hydrALAZINE (APRESOLINE) 25 MG tablet TAKE 3 TABLETS (75 MG TOTAL) BY MOUTH 3 (THREE) TIMES DAILY. (Patient taking differently: Take 50 mg by mouth 3 (three) times daily.)   insulin lispro (HUMALOG) 100 UNIT/ML injection 5 UNITS WITH  BREAKFAST, AND 22 UNITS WITH SUPPER   latanoprost (XALATAN) 0.005 % ophthalmic solution Place 1 drop into both eyes nightly.   montelukast (SINGULAIR) 10 MG tablet TAKE 1 TABLET BY MOUTH AT BEDTIME   omeprazole (PRILOSEC) 40 MG capsule Take 1 capsule (40 mg total) by mouth daily.   potassium chloride (KLOR-CON) 10 MEQ tablet Take 1 tablet (10 mEq total) by mouth daily.   pravastatin (PRAVACHOL) 40 MG tablet TAKE 1 TABLET BY MOUTH EVERY DAY   Syringe/Needle, Disp, (SYRINGE 3CC/25GX1") 25G X 1" 3 ML MISC Use to inject into the skin 2x a day        Allergies:   Aspirin and Ramipril   Past Medical History:  Diagnosis Date   Allergic rhinitis    Anterior chest wall pain    Anxiety    Asthma    Chronic diastolic CHF (congestive heart failure) ([place])    Echo 01/2020: EF 60-65, normal wall motion, mild LVH, normal RV SF, RVSP 52.6 (moderate elevation), severe LAE, moderate RAE, trivial MR, mild MS (mean gradient 5.5 mmHg), mild aortic valve sclerosis (no AS); elevated E/e' c/w elevated LVEDP   Cough  DM type 2 (diabetes mellitus, type 2) (HCC)    GERD (gastroesophageal reflux disease)    History of diverticulitis of colon    HTN (hypertension)    Hyperlipidemia    IBS (irritable bowel syndrome)    Iron deficiency anemia    Osteoporosis, unspecified    Renal insufficiency 07/31/2017   UTI (urinary tract infection)     Past Surgical History:  Procedure Laterality Date   CARDIOVASCULAR STRESS TEST  02/25/04   ESOPHAGOGASTRODUODENOSCOPY  12/26/01   PACEMAKER IMPLANT N/A 08/02/2020   Procedure: PACEMAKER IMPLANT;  Surgeon: Klein, Steven C, MD;  [facility]: [facility];  Service: Cardiovascular;  Laterality: N/A;   RIGHT OOPHORECTOMY  jan 2010     Social History:  The patient  reports that she quit smoking about 34 years ago. Her smoking use included cigarettes. She has a 1.50 pack-year smoking history. She has never used smokeless tobacco. She reports that she does not drink alcohol  and does not use drugs.   Family History:  The patient's family history includes Cancer in her sister; Diabetes in her daughter; Hypertension in her mother; Lung cancer in her brother; Melanoma in her brother; Other in her father; Stroke in her mother.    ROS:  Please see the history of present illness. All other systems are reviewed and  Negative to the above problem except as noted.    PHYSICAL EXAM: VS:  BP 138/60   Pulse 75   Ht 5' 7" (1.702 m)   Wt 186 lb 6.4 oz (84.6 kg)   SpO2 97%   BMI 29.19 kg/m   GEN:  Overweight 85 yo  in no acute distress  HEENT: normal  Neck: JVP normal   Cardiac: RRR  ; II/VI sys murmur LSB  GrI/VI diastolic murmur   NO LE edema  Respiratory:  clear to auscultation bilaterally, GI: soft, nontender, nondistended, + BS  No hepatomegaly  MS: no deformity Moving all extremities   Skin: warm and dry, no rash Neuro:  Strength and sensation are intact Psych: euthymic mood, full affect   EKG:  EKG is not ordered today   Ehco   07/2020 1. Left ventricular ejection fraction, by estimation, is 65 to 70%. The left ventricle has hyperdynamic function. The left ventricle has no regional wall motion abnormalities. There is mild left ventricular hypertrophy. Left ventricular diastolic parameters are indeterminate. 2. Right ventricular systolic function is normal. The right ventricular size is normal. There is severely elevated pulmonary artery systolic pressure. The estimated right ventricular systolic pressure is 67.7 mmHg. 3. Left atrial size was moderately dilated. 4. Right atrial size was mildly dilated. 5. The mitral valve is degenerative. No evidence of mitral valve regurgitation. Moderate mitral stenosis. The mean mitral valve gradient is 7.0 mmHg. Moderate mitral annular calcification. 6. Tricuspid valve regurgitation is moderate. 7. The aortic valve is tricuspid. Aortic valve regurgitation is not visualized. Mild aortic valve sclerosis is present,  with no evidence of aortic valve stenosis. 8. The inferior vena cava is dilated in size with <50% respiratory variability, suggesting right atrial pressure of 15 mmHg. 9. Technically difficult study with poor acoustic windows.  CT coronary angiogram  08/2016   IMPRESSION: 1. Coronary calcium score of 51. This was 47 percentile for age and sex matched control.   2. Normal coronary origin with right dominance.   3. There is moderate CAD in the proximal and mid LAD and possible severe stenosis in the ostial D2. We will obtain additional   CT FFR date.   4. Left atrial appendage is large with a filling defect in its distal portion, this might represent poor contrast mixing, however thrombus can't be excluded.   5. Mitral annular calcifications.    Lipid Panel    Component Value Date/Time   CHOL 104 08/27/2019 1120   TRIG 59.0 08/27/2019 1120   TRIG 83 08/07/2006 1419   HDL 44.90 08/27/2019 1120   CHOLHDL 2 08/27/2019 1120   VLDL 11.8 08/27/2019 1120   LDLCALC 47 08/27/2019 1120      Wt Readings from Last 3 Encounters:  07/28/21 186 lb 6.4 oz (84.6 kg)  07/04/21 182 lb 3.2 oz (82.6 kg)  03/08/21 184 lb 9.6 oz (83.7 kg)      ASSESSMENT AND PLAN:  1  Atrial fibrillation  Permanent   On Eliquis   Follow labs  Follow with EP  2  CAD Pt with CAD  No symptoms to suggest angina    3  PPM  Hx of CHB   Follows with S Klein  3  MV dz  Last echo in Oct 2021  Mean gradient across MV 7 mm Hg  Mod MS Had elevated LAE and  PAP on last echo     Will repeat    4  HTN  BP control is fair  Follow for now given age  67  HL   Repeat lipids      Check BMET and CBC and lipids     Signed, Dorris Carnes, MD  07/28/2021 8:23 AM    [address], [place], [place]  [address] Phone: [phone]; Fax: [phone]

## 2021-07-31 ENCOUNTER — Other Ambulatory Visit: Payer: Self-pay | Admitting: Endocrinology

## 2021-07-31 DIAGNOSIS — Z794 Long term (current) use of insulin: Secondary | ICD-10-CM

## 2021-07-31 DIAGNOSIS — N183 Chronic kidney disease, stage 3 unspecified: Secondary | ICD-10-CM

## 2021-08-01 ENCOUNTER — Ambulatory Visit (INDEPENDENT_AMBULATORY_CARE_PROVIDER_SITE_OTHER): Payer: Medicare Other

## 2021-08-01 DIAGNOSIS — I442 Atrioventricular block, complete: Secondary | ICD-10-CM

## 2021-08-01 LAB — CUP PACEART REMOTE DEVICE CHECK
Battery Remaining Longevity: 149 mo
Battery Voltage: 3.06 V
Brady Statistic RV Percent Paced: 99.66 %
Date Time Interrogation Session: 20221030205722
Implantable Lead Implant Date: 20211101
Implantable Lead Location: 753860
Implantable Lead Model: 3830
Implantable Pulse Generator Implant Date: 20211101
Lead Channel Impedance Value: 304 Ohm
Lead Channel Impedance Value: 399 Ohm
Lead Channel Pacing Threshold Amplitude: 0.875 V
Lead Channel Pacing Threshold Pulse Width: 0.4 ms
Lead Channel Sensing Intrinsic Amplitude: 13.875 mV
Lead Channel Sensing Intrinsic Amplitude: 13.875 mV
Lead Channel Setting Pacing Amplitude: 2 V
Lead Channel Setting Pacing Pulse Width: 0.4 ms
Lead Channel Setting Sensing Sensitivity: 0.9 mV

## 2021-08-09 NOTE — Progress Notes (Signed)
Remote pacemaker transmission.   

## 2021-08-11 ENCOUNTER — Ambulatory Visit (HOSPITAL_COMMUNITY): Payer: Medicare Other | Attending: Cardiology

## 2021-08-11 ENCOUNTER — Other Ambulatory Visit: Payer: Self-pay

## 2021-08-11 DIAGNOSIS — I4891 Unspecified atrial fibrillation: Secondary | ICD-10-CM | POA: Diagnosis not present

## 2021-08-11 DIAGNOSIS — I340 Nonrheumatic mitral (valve) insufficiency: Secondary | ICD-10-CM | POA: Diagnosis not present

## 2021-08-11 DIAGNOSIS — I5032 Chronic diastolic (congestive) heart failure: Secondary | ICD-10-CM

## 2021-08-11 DIAGNOSIS — Z79899 Other long term (current) drug therapy: Secondary | ICD-10-CM

## 2021-08-11 LAB — ECHOCARDIOGRAM COMPLETE
Area-P 1/2: 3.77 cm2
MV VTI: 1.13 cm2
S' Lateral: 3.7 cm

## 2021-08-29 ENCOUNTER — Telehealth: Payer: Self-pay | Admitting: Family

## 2021-08-29 NOTE — Telephone Encounter (Signed)
She last saw you in April.  When did you want her to follow up before I refill?

## 2021-08-29 NOTE — Telephone Encounter (Signed)
Medication: pravastatin (PRAVACHOL) 40 MG tablet [[id number]]      Has the patient contacted their pharmacy? No. (If no, request that the patient contact the pharmacy for the refill.) (If yes, when and what did the pharmacy advise?) Pharmacy advised pt they tried reaching out but no response.     Preferred Pharmacy (with phone number or street name):  [id number] [address], [place]  [address] [place] [address]  Phone:  [phone]  Fax:  [phone]    Agent: Please be advised that RX refills may take up to 3 business days. We ask that you follow-up with your pharmacy.

## 2021-08-30 ENCOUNTER — Other Ambulatory Visit: Payer: Self-pay | Admitting: Family

## 2021-08-30 MED ORDER — PRAVASTATIN SODIUM 40 MG PO TABS: 40.0000 mg | ORAL_TABLET | Freq: Every day | ORAL | 1 refills | Status: DC

## 2021-08-30 NOTE — Telephone Encounter (Signed)
Rx sent in but patient stated that she will call back to schedule follow up appointment.

## 2021-09-19 ENCOUNTER — Other Ambulatory Visit: Payer: Self-pay | Admitting: Internal Medicine

## 2021-09-26 ENCOUNTER — Other Ambulatory Visit: Payer: Self-pay | Admitting: Internal Medicine

## 2021-10-08 DIAGNOSIS — M1711 Unilateral primary osteoarthritis, right knee: Secondary | ICD-10-CM | POA: Diagnosis not present

## 2021-10-08 DIAGNOSIS — M170 Bilateral primary osteoarthritis of knee: Secondary | ICD-10-CM | POA: Diagnosis not present

## 2021-10-08 DIAGNOSIS — M7051 Other bursitis of knee, right knee: Secondary | ICD-10-CM | POA: Diagnosis not present

## 2021-10-10 ENCOUNTER — Ambulatory Visit (INDEPENDENT_AMBULATORY_CARE_PROVIDER_SITE_OTHER): Payer: Self-pay | Admitting: Podiatry

## 2021-10-10 DIAGNOSIS — Z91199 Patient's noncompliance with other medical treatment and regimen due to unspecified reason: Secondary | ICD-10-CM

## 2021-10-10 NOTE — Progress Notes (Signed)
   Complete physical exam  Patient: [name]   DOB: 07/22/1997   86 y.o. Female  MRN: [id number]  Subjective:    No chief complaint on file.   [name] is a 86 y.o. female who presents today for a complete physical exam. She reports consuming a {diet types:17450} diet. {types:19826} She generally feels {DESC; WELL/FAIRLY WELL/POORLY:18703}. She reports sleeping {DESC; WELL/FAIRLY WELL/POORLY:18703}. She {does/does not:200015} have additional problems to discuss today.    Most recent fall risk assessment:    03/29/2022   10:42 AM  Fall Risk   Falls in the past year? 0  Number falls in past yr: 0  Injury with Fall? 0  Risk for fall due to : No Fall Risks  Follow up Falls evaluation completed     Most recent depression screenings:    03/29/2022   10:42 AM 02/17/2021   10:46 AM  PHQ 2/9 Scores  PHQ - 2 Score 0 0  PHQ- 9 Score 5     {VISON DENTAL STD PSA (Optional):27386}  {History (Optional):23778}  Patient Care Team: Jessup, Joy, NP as PCP - General (Nurse Practitioner)   Outpatient Medications Prior to Visit  Medication Sig   fluticasone (FLONASE) 50 MCG/ACT nasal spray Place 2 sprays into both nostrils in the morning and at bedtime. After 7 days, reduce to once daily.   norgestimate-ethinyl estradiol (SPRINTEC 28) 0.25-35 MG-MCG tablet Take 1 tablet by mouth daily.   Nystatin POWD Apply liberally to affected area 2 times per day   spironolactone (ALDACTONE) 100 MG tablet Take 1 tablet (100 mg total) by mouth daily.   No facility-administered medications prior to visit.    ROS        Objective:     There were no vitals taken for this visit. {Vitals History (Optional):23777}  Physical Exam   No results found for any visits on 05/04/22. {Show previous labs (optional):23779}    Assessment & Plan:    Routine Health Maintenance and Physical Exam  Immunization History  Administered Date(s) Administered   DTaP 10/05/1999, 12/01/1999,  02/09/2000, 10/25/2000, 05/10/2004   Hepatitis A 03/06/2008, 03/12/2009   Hepatitis B 07/23/1999, 08/30/1999, 02/09/2000   HiB (PRP-OMP) 10/05/1999, 12/01/1999, 02/09/2000, 10/25/2000   IPV 10/05/1999, 12/01/1999, 07/30/2000, 05/10/2004   Influenza,inj,Quad PF,6+ Mos 06/12/2014   Influenza-Unspecified 09/11/2012   MMR 07/30/2001, 05/10/2004   Meningococcal Polysaccharide 03/11/2012   Pneumococcal Conjugate-13 10/25/2000   Pneumococcal-Unspecified 02/09/2000, 04/24/2000   Tdap 03/11/2012   Varicella 07/30/2000, 03/06/2008    Health Maintenance  Topic Date Due   HIV Screening  Never done   Hepatitis C Screening  Never done   INFLUENZA VACCINE  05/02/2022   PAP-Cervical Cytology Screening  05/04/2022 (Originally 07/21/2020)   PAP SMEAR-Modifier  05/04/2022 (Originally 07/21/2020)   TETANUS/TDAP  05/04/2022 (Originally 03/11/2022)   HPV VACCINES  Discontinued   COVID-19 Vaccine  Discontinued    Discussed health benefits of physical activity, and encouraged her to engage in regular exercise appropriate for her age and condition.  Problem List Items Addressed This Visit   None Visit Diagnoses     Annual physical exam    -  Primary   Cervical cancer screening       Need for Tdap vaccination          No follow-ups on file.     Joy Jessup, NP   

## 2021-10-11 DIAGNOSIS — M170 Bilateral primary osteoarthritis of knee: Secondary | ICD-10-CM | POA: Diagnosis not present

## 2021-10-11 DIAGNOSIS — M25562 Pain in left knee: Secondary | ICD-10-CM | POA: Diagnosis not present

## 2021-10-19 ENCOUNTER — Other Ambulatory Visit: Payer: Self-pay | Admitting: Internal Medicine

## 2021-10-26 ENCOUNTER — Other Ambulatory Visit: Payer: Self-pay | Admitting: Orthopedic Surgery

## 2021-10-26 DIAGNOSIS — M25561 Pain in right knee: Secondary | ICD-10-CM

## 2021-10-31 ENCOUNTER — Ambulatory Visit (INDEPENDENT_AMBULATORY_CARE_PROVIDER_SITE_OTHER): Payer: Medicare Other

## 2021-10-31 DIAGNOSIS — I442 Atrioventricular block, complete: Secondary | ICD-10-CM | POA: Diagnosis not present

## 2021-10-31 LAB — CUP PACEART REMOTE DEVICE CHECK
Battery Remaining Longevity: 146 mo
Battery Voltage: 3.04 V
Brady Statistic RV Percent Paced: 98.94 %
Date Time Interrogation Session: 20230130005704
Implantable Lead Implant Date: 20211101
Implantable Lead Location: 753860
Implantable Lead Model: 3830
Implantable Pulse Generator Implant Date: 20211101
Lead Channel Impedance Value: 323 Ohm
Lead Channel Impedance Value: 418 Ohm
Lead Channel Pacing Threshold Amplitude: 0.875 V
Lead Channel Pacing Threshold Pulse Width: 0.4 ms
Lead Channel Sensing Intrinsic Amplitude: 8.75 mV
Lead Channel Sensing Intrinsic Amplitude: 8.75 mV
Lead Channel Setting Pacing Amplitude: 2 V
Lead Channel Setting Pacing Pulse Width: 0.4 ms
Lead Channel Setting Sensing Sensitivity: 0.9 mV

## 2021-11-04 ENCOUNTER — Encounter: Payer: Self-pay | Admitting: Internal Medicine

## 2021-11-04 ENCOUNTER — Other Ambulatory Visit: Payer: Self-pay

## 2021-11-04 ENCOUNTER — Ambulatory Visit (INDEPENDENT_AMBULATORY_CARE_PROVIDER_SITE_OTHER): Payer: Medicare Other | Admitting: Internal Medicine

## 2021-11-04 VITALS — BP 156/82 | HR 86 | Ht 67.0 in | Wt 183.2 lb

## 2021-11-04 DIAGNOSIS — I5032 Chronic diastolic (congestive) heart failure: Secondary | ICD-10-CM | POA: Diagnosis not present

## 2021-11-04 DIAGNOSIS — I442 Atrioventricular block, complete: Secondary | ICD-10-CM

## 2021-11-04 DIAGNOSIS — Z950 Presence of cardiac pacemaker: Secondary | ICD-10-CM

## 2021-11-04 DIAGNOSIS — I4821 Permanent atrial fibrillation: Secondary | ICD-10-CM | POA: Diagnosis not present

## 2021-11-04 NOTE — Patient Instructions (Signed)
Medication Instructions:  Your physician recommends that you continue on your current medications as directed. Please refer to the Current Medication list given to you today.  *If you need a refill on your cardiac medications before your next appointment, please call your pharmacy*   Lab Work: None ordered.  If you have labs (blood work) drawn today and your tests are completely normal, you will receive your results only by: [place] (if you have MyChart) OR A paper copy in the mail If you have any lab test that is abnormal or we need to change your treatment, we will call you to review the results.   Testing/Procedures: None ordered.    Follow-Up: At [facility], you and your health needs are our priority.  As part of our continuing mission to provide you with exceptional heart care, we have created designated Provider Care Teams.  These Care Teams include your primary Cardiologist (physician) and Advanced Practice Providers (APPs -  Physician Assistants and Nurse Practitioners) who all work together to provide you with the care you need, when you need it.  We recommend signing up for the patient portal called "MyChart".  Sign up information is provided on this After Visit Summary.  MyChart is used to connect with patients for Virtual Visits (Telemedicine).  Patients are able to view lab/test results, encounter notes, upcoming appointments, etc.  Non-urgent messages can be sent to your provider as well.   To learn more about what you can do with MyChart, go to [email or web address].    Your next appointment:   6 months with PA-C

## 2021-11-04 NOTE — Progress Notes (Signed)
Patient Care Team: [name], [place] as PCP - General (Internal Medicine) Fay Records, MD as PCP - Cardiology (Cardiology) Richmond Campbell, MD as Attending Physician (Gastroenterology) Netta Cedars, MD as Attending Physician (Orthopedic Surgery) Tanda Rockers, MD as Attending Physician (Pulmonary Disease) Rutherford Guys, MD as Consulting Physician (Ophthalmology)   HPI  [name] is a 86 y.o. 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 She reports that her breathing while walking is "sometimes good and sometimes bad". She attributes this to her asthma and associated coughing with flare-ups.  Sometimes she develops peripheral edema. She is taking Lasix as needed, which does help her.  The patient denies chest pain, nocturnal dyspnea, or orthopnea.  There have been no palpitations, lightheadedness or syncope.  She is concerned that the keloidal scar on her chest appears to be increasing.  She is a former smoker.  Date Cr K Hgb  10/21 1.89<<2.91 4.5<<3.2 12.4  10/22 1.77 4.1 11.0    DATE TEST EF   11/17 CTA   % CAD-LAD mod  (FFR appar neg)  10/21 Echo   65-70 % PA press 66m MS mod -mean grad 766m 11/22 Echo 60-65 %     Thromboembolic risk factors ( age  -2, HTN-, DM-1, Gender-1) for a CHADSVASc Score of >=5 on Apixoban  Dosed appropriately ( age/Cr)    Records and Results Reviewed   Past Medical History:  Diagnosis Date   Allergic rhinitis    Anterior chest wall pain    Anxiety    Asthma    [place]   Echo 01/2020: EF 60-65, normal wall motion, mild LVH, normal RV SF, RVSP 52.6 (moderate elevation), severe LAE, moderate RAE, trivial MR, mild MS (mean gradient 5.5 mmHg), mild aortic valve sclerosis (no AS); elevated E/e' c/w elevated LVEDP   Cough    DM type 2 (diabetes mellitus,  type 2) (HCC)    GERD (gastroesophageal reflux disease)    History of diverticulitis of colon    HTN (hypertension)    Hyperlipidemia    IBS (irritable bowel syndrome)    Iron deficiency anemia    Osteoporosis, unspecified    Renal insufficiency 07/31/2017   UTI (urinary tract infection)     Past Surgical History:  Procedure Laterality Date   CARDIOVASCULAR STRESS TEST  02/25/04   ESOPHAGOGASTRODUODENOSCOPY  12/26/01   PACEMAKER IMPLANT N/A 08/02/2020   Procedure: PACEMAKER IMPLANT;  Surgeon: KlDeboraha SprangMD;  [facility];  Service: Cardiovascular;  Laterality: N/A;   RIGHT OOPHORECTOMY  jan 2010    Current Meds  Medication Sig   acetaminophen (TYLENOL) 325 MG tablet Take 650 mg by mouth every 6 (six) hours as needed for pain.   albuterol (PROAIR HFA) 108 (90 Base) MCG/ACT inhaler Inhale 2 puffs into the lungs every 4 (four) hours as needed for wheezing.   amLODipine (NORVASC) 5 MG tablet TAKE 1 TABLET BY MOUTH TWICE A DAY   azithromycin (ZITHROMAX) 250 MG tablet Take 2 on day one then 1 daily x 4 days   BD INSULIN SYRINGE U/F 31G X 5/16" 1 ML MISC Inject into the skin 2 (two) times daily.   Blood Glucose Monitoring Suppl (ONE TOUCH ULTRA 2) W/DEVICE KIT Check blood sugar 3 times per day.   budesonide-formoterol (SYMBICORT) 160-4.5 MCG/ACT inhaler TAKE  2 PUFFS FIRST THING IN AM AND THEN ANOTHER 2 PUFFS ABOUT 12 HOURS LATER.   cetirizine (ZYRTEC ALLERGY) 10 MG tablet Take 1 tablet (10 mg total) by mouth daily as needed for allergies.   diclofenac Sodium (VOLTAREN) 1 % GEL as needed.   ELIQUIS 2.5 MG TABS tablet TAKE 1 TABLET BY MOUTH TWICE A DAY   furosemide (LASIX) 40 MG tablet TAKE 1 TABLET BY MOUTH EVERY DAY (Patient taking differently: as needed.)   glucose blood test strip Check blood sugar twice a day   hydrALAZINE (APRESOLINE) 25 MG tablet TAKE 3 TABLETS (75 MG TOTAL) BY MOUTH 3 (THREE) TIMES DAILY.   insulin lispro (HUMALOG) 100 UNIT/ML injection 5 UNITS  WITH BREAKFAST, AND 22 UNITS WITH SUPPER   latanoprost (XALATAN) 0.005 % ophthalmic solution Place 1 drop into both eyes nightly.   montelukast (SINGULAIR) 10 MG tablet TAKE 1 TABLET BY MOUTH AT BEDTIME   omeprazole (PRILOSEC) 40 MG capsule Take 1 capsule (40 mg total) by mouth daily.   potassium chloride (KLOR-CON) 10 MEQ tablet TAKE 1 TABLET BY MOUTH EVERY DAY   pravastatin (PRAVACHOL) 40 MG tablet Take 1 tablet (40 mg total) by mouth daily.   STUDY - ASPIRE - apixaban 2.5 mg or placebo tablet (PI-Sethi)    Syringe/Needle, Disp, (SYRINGE 3CC/25GX1") 25G X 1" 3 ML MISC Use to inject into the skin 2x a day    Allergies  Allergen Reactions   Aspirin Shortness Of Breath and Other (See Comments)    Caused asthma symptoms   Ramipril Cough    Review of Systems negative except from HPI and PMH  Physical Exam BP (!) 156/82 (BP Location: Right Arm, Patient Position: Sitting, Cuff Size: Normal)    Pulse 86    Ht '5\' 7"'  (1.702 m)    Wt 183 lb 3.2 oz (83.1 kg)    BMI 28.69 kg/m  Well developed and well nourished in no acute distress HENT normal Neck supple with JVP-flat Clear Device pocket well healed; without hematoma or erythema.  There is no tethering  Regular rate and rhythm, no murmur Abd-soft with active BS No Clubbing cyanosis  edema Skin-warm and dry A & Oriented  Grossly normal sensory and motor function  ECG aflutter/afib 2ith complete heart block And Vpacing QRSd 138   CrCl cannot be calculated (Patient's most recent lab result is older than the maximum 21 days allowed.).   Assessment and  Plan Complete heart block  Atrial flutter/fibrillation-permanent  Mitral stenosis-moderate  HFpEF-class IIb-IIIa  Pacemaker-Medtronic-left bundle branch block area  Renal insufficiency grade 3-4  Permanent atrial fibrillation, anticoagulated with Eliquis at 2.5 mg twice daily.  No bleeding  Blood pressure is elevated here but at home it is okay.  We will continue the Apresoline  25 3 times daily and amlodipine 5 mg twice daily  Device function is normal and QRSd suggests reasonable left bundle branch activation       Current medicines are reviewed at length with the patient today .  The patient does not  have concerns regarding medicines.   I,Mathew Stumpf,acting as a scribe for Virl Axe, MD.,have documented all relevant documentation on the behalf of Virl Axe, MD,as directed by  Virl Axe, MD while in the presence of Virl Axe, MD.  I, Virl Axe, MD, have reviewed all documentation for this visit. The documentation on 11/04/21 for the exam, diagnosis, procedures, and orders are all accurate and complete.

## 2021-11-08 NOTE — Addendum Note (Signed)
Addended by: Janan Halter F on: 11/08/2021 02:18 PM   Modules accepted: Orders

## 2021-11-08 NOTE — Progress Notes (Signed)
Remote pacemaker transmission.   

## 2021-11-20 ENCOUNTER — Other Ambulatory Visit: Payer: Self-pay | Admitting: Internal Medicine

## 2021-11-20 DIAGNOSIS — I4891 Unspecified atrial fibrillation: Secondary | ICD-10-CM

## 2021-11-21 NOTE — Telephone Encounter (Signed)
Eliquis 2.5mg  refill request received. Patient is 86 years old, weight-83.1kg, Crea-1.77 on 07/28/2021, Diagnosis-Afib, and last seen by Dr. Caryl Comes on 11/04/2021. Dose is appropriate based on dosing criteria. Will send in refill to requested pharmacy.

## 2021-11-24 ENCOUNTER — Other Ambulatory Visit: Payer: Medicare Other

## 2021-11-24 ENCOUNTER — Inpatient Hospital Stay: Admission: RE | Admit: 2021-11-24 | Payer: Medicare Other | Source: Ambulatory Visit

## 2021-12-07 ENCOUNTER — Encounter: Payer: Self-pay | Admitting: Podiatry

## 2021-12-07 ENCOUNTER — Ambulatory Visit (INDEPENDENT_AMBULATORY_CARE_PROVIDER_SITE_OTHER): Payer: Medicare Other | Admitting: Podiatry

## 2021-12-07 ENCOUNTER — Other Ambulatory Visit: Payer: Self-pay

## 2021-12-07 DIAGNOSIS — B351 Tinea unguium: Secondary | ICD-10-CM | POA: Diagnosis not present

## 2021-12-07 DIAGNOSIS — E1151 Type 2 diabetes mellitus with diabetic peripheral angiopathy without gangrene: Secondary | ICD-10-CM | POA: Diagnosis not present

## 2021-12-07 DIAGNOSIS — M79674 Pain in right toe(s): Secondary | ICD-10-CM

## 2021-12-07 DIAGNOSIS — M79675 Pain in left toe(s): Secondary | ICD-10-CM | POA: Diagnosis not present

## 2021-12-07 DIAGNOSIS — Q828 Other specified congenital malformations of skin: Secondary | ICD-10-CM

## 2021-12-07 DIAGNOSIS — L84 Corns and callosities: Secondary | ICD-10-CM | POA: Diagnosis not present

## 2021-12-12 ENCOUNTER — Other Ambulatory Visit: Payer: Self-pay

## 2021-12-12 ENCOUNTER — Encounter: Payer: Self-pay | Admitting: Endocrinology

## 2021-12-12 ENCOUNTER — Ambulatory Visit (INDEPENDENT_AMBULATORY_CARE_PROVIDER_SITE_OTHER): Payer: Medicare Other | Admitting: Endocrinology

## 2021-12-12 VITALS — BP 178/80 | HR 82 | Ht 67.0 in | Wt 181.8 lb

## 2021-12-12 DIAGNOSIS — E1122 Type 2 diabetes mellitus with diabetic chronic kidney disease: Secondary | ICD-10-CM

## 2021-12-12 DIAGNOSIS — N183 Chronic kidney disease, stage 3 unspecified: Secondary | ICD-10-CM

## 2021-12-12 DIAGNOSIS — Z794 Long term (current) use of insulin: Secondary | ICD-10-CM

## 2021-12-12 LAB — POCT GLYCOSYLATED HEMOGLOBIN (HGB A1C): Hemoglobin A1C: 5.2 % (ref 4.0–5.6)

## 2021-12-12 MED ORDER — INSULIN LISPRO 100 UNIT/ML IJ SOLN: INTRAMUSCULAR | 1 refills | Status: DC

## 2021-12-12 MED ORDER — ONETOUCH VERIO W/DEVICE KIT: 1.0000 | PACK | Freq: Once | 0 refills | Status: AC

## 2021-12-12 MED ORDER — ONETOUCH VERIO VI STRP: 1.0000 | ORAL_STRIP | Freq: Two times a day (BID) | 3 refills | Status: DC

## 2021-12-12 NOTE — Patient Instructions (Addendum)
Your blood pressure is high today.  Please see your primary care provider soon, to have it rechecked.   ?Please decrease the supper insulin to 18 units.   ?Please continue the same 5 units with breakfast.   ?check your blood sugar twice a day.  vary the time of day when you check, between before the 3 meals, and at bedtime.  also check if you have symptoms of your blood sugar being too high or too low.  please keep a record of the readings and bring it to your next appointment here (or you can bring the meter itself).  You can write it on any piece of paper.  please call us sooner if your blood sugar goes below 70, or if you have a lot of readings over 200.   ?Please come back for a follow-up appointment in 4 months.   ? ?

## 2021-12-12 NOTE — Progress Notes (Signed)
? ?Subjective:  ? ? Patient ID: [name], female    DOB: 1935/12/20, 86 y.o.   MRN: [id number] ? ?HPI ?Pt returns for f/u of diabetes mellitus:  ?DM type: Insulin-requiring type 2 ?Dx'ed: 1996 ?Complications: renal insuff ?Therapy: insulin since 2008 ?GDM: never ?DKA: never ?Severe hypoglycemia: never.   ?Pancreatitis: never.  ?Other: she takes multiple daily injections; fructosamine converts to 1% higher than A1c itself.   ?Interval history: no cbg record, but states cbg varies from 90-178.  pt states she feels well in general.  She takes insulin as rx'ed.  She seldom has hypoglycemia, and these episodes are mild.  This happens at HS  It is in general highest in the afternoon.  She takes 5 units qam and 22 units qpm.   ?Past Medical History:  ?Diagnosis Date  ? Allergic rhinitis   ? Anterior chest wall pain   ? Anxiety   ? Asthma   ? Chronic diastolic CHF (congestive heart failure) ([place])   ? Echo 01/2020: EF 60-65, normal wall motion, mild LVH, normal RV SF, RVSP 52.6 (moderate elevation), severe LAE, moderate RAE, trivial MR, mild MS (mean gradient 5.5 mmHg), mild aortic valve sclerosis (no AS); elevated E/e' c/w elevated LVEDP  ? Cough   ? DM type 2 (diabetes mellitus, type 2) ([place])   ? GERD (gastroesophageal reflux disease)   ? History of diverticulitis of colon   ? HTN (hypertension)   ? Hyperlipidemia   ? IBS (irritable bowel syndrome)   ? Iron deficiency anemia   ? Osteoporosis, unspecified   ? Renal insufficiency 07/31/2017  ? UTI (urinary tract infection)   ? ? ?Past Surgical History:  ?Procedure Laterality Date  ? CARDIOVASCULAR STRESS TEST  02/25/04  ? ESOPHAGOGASTRODUODENOSCOPY  12/26/01  ? PACEMAKER IMPLANT N/A 08/02/2020  ? Procedure: PACEMAKER IMPLANT;  Surgeon: Deboraha Sprang, MD;  Location: [facility];  Service: Cardiovascular;  Laterality: N/A;  ? RIGHT OOPHORECTOMY  jan 2010  ? ? ?Social History  ? ?Socioeconomic History  ? Marital status: Widowed  ?  Spouse name: Not on file  ? Number of  children: Not on file  ? Years of education: Not on file  ? Highest education level: Not on file  ?Occupational History  ? Occupation: retired  ?Tobacco Use  ? Smoking status: Former  ?  Packs/day: 0.30  ?  Years: 5.00  ?  Pack years: 1.50  ?  Types: Cigarettes  ?  Quit date: 10/02/1986  ?  Years since quitting: 35.2  ? Smokeless tobacco: Never  ?Vaping Use  ? Vaping Use: Never used  ?Substance and Sexual Activity  ? Alcohol use: No  ? Drug use: No  ? Sexual activity: Not on file  ?Other Topics Concern  ? Not on file  ?Social History Narrative  ? Not on file  ? ?Social Determinants of Health  ? ?Financial Resource Strain: Not on file  ?Food Insecurity: Not on file  ?Transportation Needs: Not on file  ?Physical Activity: Not on file  ?Stress: Not on file  ?Social Connections: Not on file  ?Intimate Partner Violence: Not on file  ? ? ?Current Outpatient Medications on File Prior to Visit  ?Medication Sig Dispense Refill  ? acetaminophen (TYLENOL) 325 MG tablet Take 650 mg by mouth every 6 (six) hours as needed for pain.    ? amLODipine (NORVASC) 5 MG tablet TAKE 1 TABLET BY MOUTH TWICE A DAY 180 tablet 3  ? azithromycin (ZITHROMAX) 250  MG tablet Take 2 on day one then 1 daily x 4 days 6 tablet 0  ? BD INSULIN SYRINGE U/F 31G X 5/16" 1 ML MISC Inject into the skin 2 (two) times daily.    ? Blood Glucose Monitoring Suppl (ONE TOUCH ULTRA 2) W/DEVICE KIT Check blood sugar 3 times per day. 1 each 0  ? budesonide-formoterol (SYMBICORT) 160-4.5 MCG/ACT inhaler TAKE 2 PUFFS FIRST THING IN AM AND THEN ANOTHER 2 PUFFS ABOUT 12 HOURS LATER. 30.6 each 5  ? cetirizine (ZYRTEC ALLERGY) 10 MG tablet Take 1 tablet (10 mg total) by mouth daily as needed for allergies.    ? diclofenac Sodium (VOLTAREN) 1 % GEL as needed.    ? ELIQUIS 2.5 MG TABS tablet TAKE 1 TABLET BY MOUTH TWICE A DAY 60 tablet 6  ? furosemide (LASIX) 40 MG tablet TAKE 1 TABLET BY MOUTH EVERY DAY (Patient taking differently: as needed.) 90 tablet 1  ? glucose blood  test strip Check blood sugar twice a day 100 each 12  ? hydrALAZINE (APRESOLINE) 25 MG tablet TAKE 3 TABLETS (75 MG TOTAL) BY MOUTH 3 (THREE) TIMES DAILY. 810 tablet 2  ? latanoprost (XALATAN) 0.005 % ophthalmic solution Place 1 drop into both eyes nightly.    ? montelukast (SINGULAIR) 10 MG tablet TAKE 1 TABLET BY MOUTH AT BEDTIME 90 tablet 3  ? omeprazole (PRILOSEC) 40 MG capsule Take 1 capsule (40 mg total) by mouth daily. 30 capsule 11  ? potassium chloride (KLOR-CON) 10 MEQ tablet TAKE 1 TABLET BY MOUTH EVERY DAY 90 tablet 2  ? pravastatin (PRAVACHOL) 40 MG tablet Take 1 tablet (40 mg total) by mouth daily. 90 tablet 1  ? STUDY - ASPIRE - apixaban 2.5 mg or placebo tablet (PI-Sethi)     ? Syringe/Needle, Disp, (SYRINGE 3CC/25GX1") 25G X 1" 3 ML MISC Use to inject into the skin 2x a day 100 each 3  ? albuterol (PROAIR HFA) 108 (90 Base) MCG/ACT inhaler Inhale 2 puffs into the lungs every 4 (four) hours as needed for wheezing. 18 g 11  ? ?No current facility-administered medications on file prior to visit.  ? ? ?Allergies  ?Allergen Reactions  ? Aspirin Shortness Of Breath and Other (See Comments)  ?  Caused asthma symptoms  ? Ramipril Cough  ? ? ?Family History  ?Problem Relation Age of Onset  ? Lung cancer Brother   ? Melanoma Brother   ? Hypertension Mother   ? Stroke Mother   ? Other Father   ?     poor circulation  ? Cancer Sister   ? Diabetes Daughter   ? Atopy Neg Hx   ? Asthma Neg Hx   ? Breast cancer Neg Hx   ? ? ?BP (!) 178/80 (BP Location: Left Arm, Patient Position: Sitting, Cuff Size: Normal)   Pulse 82   Ht '5\' 7"'  (1.702 m)   Wt 181 lb 12.8 oz (82.5 kg)   SpO2 99%   BMI 28.47 kg/m?  ? ? ?Review of Systems ? ?   ?Objective:  ? Physical Exam ? ? ? ?Lab Results  ?Component Value Date  ? HGBA1C 5.2 12/12/2021  ? ?   ?Assessment & Plan:  ?Insulin-requiring type 2 DM: overcontrolled ? ?Patient Instructions  ?Your blood pressure is high today.  Please see your primary care provider soon, to have it  rechecked.   ?Please decrease the supper insulin to 18 units.   ?Please continue the same 5 units with breakfast.   ?  check your blood sugar twice a day.  vary the time of day when you check, between before the 3 meals, and at bedtime.  also check if you have symptoms of your blood sugar being too high or too low.  please keep a record of the readings and bring it to your next appointment here (or you can bring the meter itself).  You can write it on any piece of paper.  please call us sooner if your blood sugar goes below 70, or if you have a lot of readings over 200.   ?Please come back for a follow-up appointment in 4 months.   ? ? ? ?

## 2021-12-13 ENCOUNTER — Other Ambulatory Visit: Payer: Self-pay

## 2021-12-13 DIAGNOSIS — Z794 Long term (current) use of insulin: Secondary | ICD-10-CM

## 2021-12-13 MED ORDER — ONETOUCH VERIO VI STRP: 1.0000 | ORAL_STRIP | Freq: Two times a day (BID) | 3 refills | Status: DC

## 2021-12-13 NOTE — Progress Notes (Signed)
?  Subjective:  ?Patient ID: [name], female    DOB: [birth year],  MRN: [id number] ? ?[name] presents to clinic today for at risk foot care. Pt has h/o NIDDM with PAD and painful elongated mycotic toenails 1-5 bilaterally which are tender when wearing enclosed shoe gear. Pain is relieved with periodic professional debridement. ? ?Patient states blood glucose was 158 mg/dl yesterday.  Patient did not check blood glucose today. ? ?New problem(s): None.  ? ?PCP is [name], FNP , and last visit was January 12, 2021. ? ?Allergies  ?Allergen Reactions  ? Aspirin Shortness Of Breath and Other (See Comments)  ?  Caused asthma symptoms  ? Ramipril Cough  ? ? ?Review of Systems: Negative except as noted in the HPI. ? ?Objective: No changes noted in today's physical examination. ?Physical Exam ? ?General: [name] is a pleasant 86 y.o. African American female, in NAD. AAO x 3.  ? ?Vascular:  ?Capillary refill time to digits immediate b/l lower extremities. Faintly palpable DP pulse(s) b/l lower extremities. Faintly palpable PT pulse(s) b/l lower extremities. Pedal hair absent. Lower extremity skin temperature gradient within normal limits. No pain with calf compression b/l. ? ?Dermatological:  ?Pedal skin with normal turgor, texture and tone b/l lower extremities No open wounds b/l lower extremities No interdigital macerations b/l lower extremities Toenails 1-5 b/l elongated, discolored, dystrophic, thickened, crumbly with subungual debris and tenderness to dorsal palpation. Hyperkeratotic lesion(s) R 5th toe and sub 5th met base left foot.  No erythema, no edema, no drainage, no fluctuance. Porokeratotic lesion(s) plantar aspect of heel left foot. No erythema, no edema, no drainage, no fluctuance. ? ?Musculoskeletal:  ?Normal muscle strength 5/5 to all lower extremity muscle groups bilaterally. No pain crepitus or joint limitation noted with ROM b/l. Hallux valgus with bunion deformity noted b/l lower  extremities. Hammertoe(s) noted to the 2-5 bilaterally. ? ?Neurological:  ?Protective sensation intact 5/5 intact bilaterally with 10g monofilament b/l. ? ?Hemoglobin A1C Latest Ref Rng & Units 12/12/2021 02/02/2021  ?HGBA1C 4.0 - 5.6 % 5.2 5.1  ?Some recent data might be hidden  ? ?Assessment/Plan: ?1. Pain due to onychomycosis of toenails of both feet   ?2. Corns and callosities   ?3. Porokeratosis   ?4. Type II diabetes mellitus with peripheral circulatory disorder (HCC)   ?-Examined patient. ?-Medicaid ABN signed for this year. Patient consents for services of paring of calluses today. Copy has been placed in patient chart. ?-Mycotic toenails 1-5 bilaterally were debrided in length and girth with sterile nail nippers and dremel without incident. ?-Corn(s) right fifth digit and callus(es) submet head 5 right foot and sub 5th met base left foot were pared utilizing sterile scalpel blade without incident. Total number debrided =3. ?-Painful porokeratotic lesion(s) plantar heel pad of left foot pared and enucleated with sterile scalpel blade without incident. Total number of lesions debrided=1. ?-Patient/POA to call should there be question/concern in the interim.  ? ?Return in about 3 months (around 03/09/2022). ? ?Marzetta Board, DPM  ?

## 2021-12-15 ENCOUNTER — Ambulatory Visit
Admission: RE | Admit: 2021-12-15 | Discharge: 2021-12-15 | Disposition: A | Discharge status: Home / Self Care | Payer: Medicare Other | Source: Ambulatory Visit | Attending: Orthopedic Surgery | Admitting: Orthopedic Surgery

## 2021-12-15 ENCOUNTER — Other Ambulatory Visit: Payer: Self-pay

## 2021-12-15 DIAGNOSIS — G8929 Other chronic pain: Secondary | ICD-10-CM | POA: Diagnosis not present

## 2021-12-15 DIAGNOSIS — M25561 Pain in right knee: Secondary | ICD-10-CM | POA: Diagnosis not present

## 2021-12-15 DIAGNOSIS — S83289A Other tear of lateral meniscus, current injury, unspecified knee, initial encounter: Secondary | ICD-10-CM | POA: Diagnosis not present

## 2021-12-15 MED ORDER — IOPAMIDOL (ISOVUE-M 300) INJECTION 61%
30.0000 mL | Freq: Once | INTRAMUSCULAR | Status: AC
Start: 2021-12-15 — End: 2021-12-15
  Administered 2021-12-15: 30 mL via INTRA_ARTICULAR

## 2021-12-22 ENCOUNTER — Other Ambulatory Visit: Payer: Self-pay | Admitting: Internal Medicine

## 2021-12-23 ENCOUNTER — Encounter (HOSPITAL_BASED_OUTPATIENT_CLINIC_OR_DEPARTMENT_OTHER): Payer: Self-pay | Admitting: *Deleted

## 2021-12-23 ENCOUNTER — Encounter (HOSPITAL_COMMUNITY)
Admission: EM | Disposition: A | Discharge status: Home Health | Payer: Self-pay | Source: Home / Self Care | Attending: Internal Medicine

## 2021-12-23 ENCOUNTER — Inpatient Hospital Stay (HOSPITAL_BASED_OUTPATIENT_CLINIC_OR_DEPARTMENT_OTHER)
Admission: EM | Admit: 2021-12-23 | Discharge: 2022-01-04 | DRG: 246 | Disposition: A | Discharge status: Home Health | Payer: Medicare Other | Attending: Internal Medicine | Admitting: Internal Medicine

## 2021-12-23 ENCOUNTER — Emergency Department (HOSPITAL_BASED_OUTPATIENT_CLINIC_OR_DEPARTMENT_OTHER): Payer: Medicare Other | Admitting: Radiology

## 2021-12-23 ENCOUNTER — Inpatient Hospital Stay (HOSPITAL_COMMUNITY): Payer: Medicare Other

## 2021-12-23 ENCOUNTER — Other Ambulatory Visit: Payer: Self-pay

## 2021-12-23 DIAGNOSIS — I4891 Unspecified atrial fibrillation: Secondary | ICD-10-CM | POA: Diagnosis not present

## 2021-12-23 DIAGNOSIS — I214 Non-ST elevation (NSTEMI) myocardial infarction: Secondary | ICD-10-CM | POA: Diagnosis not present

## 2021-12-23 DIAGNOSIS — E1122 Type 2 diabetes mellitus with diabetic chronic kidney disease: Secondary | ICD-10-CM | POA: Diagnosis present

## 2021-12-23 DIAGNOSIS — T508X5A Adverse effect of diagnostic agents, initial encounter: Secondary | ICD-10-CM | POA: Diagnosis not present

## 2021-12-23 DIAGNOSIS — Z8249 Family history of ischemic heart disease and other diseases of the circulatory system: Secondary | ICD-10-CM

## 2021-12-23 DIAGNOSIS — Z950 Presence of cardiac pacemaker: Secondary | ICD-10-CM | POA: Diagnosis not present

## 2021-12-23 DIAGNOSIS — N189 Chronic kidney disease, unspecified: Secondary | ICD-10-CM | POA: Diagnosis not present

## 2021-12-23 DIAGNOSIS — I5020 Unspecified systolic (congestive) heart failure: Secondary | ICD-10-CM | POA: Diagnosis not present

## 2021-12-23 DIAGNOSIS — R0902 Hypoxemia: Secondary | ICD-10-CM | POA: Diagnosis not present

## 2021-12-23 DIAGNOSIS — K8020 Calculus of gallbladder without cholecystitis without obstruction: Secondary | ICD-10-CM | POA: Diagnosis not present

## 2021-12-23 DIAGNOSIS — I5042 Chronic combined systolic (congestive) and diastolic (congestive) heart failure: Secondary | ICD-10-CM | POA: Diagnosis present

## 2021-12-23 DIAGNOSIS — Z888 Allergy status to other drugs, medicaments and biological substances status: Secondary | ICD-10-CM

## 2021-12-23 DIAGNOSIS — I272 Pulmonary hypertension, unspecified: Secondary | ICD-10-CM | POA: Diagnosis present

## 2021-12-23 DIAGNOSIS — Z801 Family history of malignant neoplasm of trachea, bronchus and lung: Secondary | ICD-10-CM

## 2021-12-23 DIAGNOSIS — N281 Cyst of kidney, acquired: Secondary | ICD-10-CM | POA: Diagnosis not present

## 2021-12-23 DIAGNOSIS — I050 Rheumatic mitral stenosis: Secondary | ICD-10-CM | POA: Diagnosis not present

## 2021-12-23 DIAGNOSIS — J189 Pneumonia, unspecified organism: Secondary | ICD-10-CM | POA: Diagnosis not present

## 2021-12-23 DIAGNOSIS — R0602 Shortness of breath: Secondary | ICD-10-CM | POA: Diagnosis not present

## 2021-12-23 DIAGNOSIS — R079 Chest pain, unspecified: Secondary | ICD-10-CM

## 2021-12-23 DIAGNOSIS — Z7901 Long term (current) use of anticoagulants: Secondary | ICD-10-CM

## 2021-12-23 DIAGNOSIS — I130 Hypertensive heart and chronic kidney disease with heart failure and stage 1 through stage 4 chronic kidney disease, or unspecified chronic kidney disease: Secondary | ICD-10-CM | POA: Diagnosis not present

## 2021-12-23 DIAGNOSIS — M1711 Unilateral primary osteoarthritis, right knee: Secondary | ICD-10-CM | POA: Diagnosis present

## 2021-12-23 DIAGNOSIS — Z7952 Long term (current) use of systemic steroids: Secondary | ICD-10-CM

## 2021-12-23 DIAGNOSIS — I5023 Acute on chronic systolic (congestive) heart failure: Secondary | ICD-10-CM | POA: Diagnosis not present

## 2021-12-23 DIAGNOSIS — R1013 Epigastric pain: Secondary | ICD-10-CM | POA: Diagnosis not present

## 2021-12-23 DIAGNOSIS — J90 Pleural effusion, not elsewhere classified: Secondary | ICD-10-CM | POA: Diagnosis not present

## 2021-12-23 DIAGNOSIS — I5021 Acute systolic (congestive) heart failure: Secondary | ICD-10-CM | POA: Diagnosis not present

## 2021-12-23 DIAGNOSIS — I200 Unstable angina: Secondary | ICD-10-CM

## 2021-12-23 DIAGNOSIS — E1143 Type 2 diabetes mellitus with diabetic autonomic (poly)neuropathy: Secondary | ICD-10-CM | POA: Diagnosis not present

## 2021-12-23 DIAGNOSIS — J45991 Cough variant asthma: Secondary | ICD-10-CM | POA: Diagnosis not present

## 2021-12-23 DIAGNOSIS — M7121 Synovial cyst of popliteal space [Baker], right knee: Secondary | ICD-10-CM | POA: Diagnosis present

## 2021-12-23 DIAGNOSIS — Z886 Allergy status to analgesic agent status: Secondary | ICD-10-CM

## 2021-12-23 DIAGNOSIS — N184 Chronic kidney disease, stage 4 (severe): Secondary | ICD-10-CM | POA: Diagnosis present

## 2021-12-23 DIAGNOSIS — I25110 Atherosclerotic heart disease of native coronary artery with unstable angina pectoris: Secondary | ICD-10-CM | POA: Diagnosis not present

## 2021-12-23 DIAGNOSIS — M810 Age-related osteoporosis without current pathological fracture: Secondary | ICD-10-CM | POA: Diagnosis present

## 2021-12-23 DIAGNOSIS — I483 Typical atrial flutter: Secondary | ICD-10-CM | POA: Diagnosis not present

## 2021-12-23 DIAGNOSIS — I442 Atrioventricular block, complete: Secondary | ICD-10-CM | POA: Diagnosis present

## 2021-12-23 DIAGNOSIS — R110 Nausea: Secondary | ICD-10-CM | POA: Diagnosis not present

## 2021-12-23 DIAGNOSIS — I959 Hypotension, unspecified: Secondary | ICD-10-CM | POA: Diagnosis not present

## 2021-12-23 DIAGNOSIS — I10 Essential (primary) hypertension: Secondary | ICD-10-CM | POA: Diagnosis not present

## 2021-12-23 DIAGNOSIS — I517 Cardiomegaly: Secondary | ICD-10-CM | POA: Diagnosis not present

## 2021-12-23 DIAGNOSIS — D631 Anemia in chronic kidney disease: Secondary | ICD-10-CM | POA: Diagnosis not present

## 2021-12-23 DIAGNOSIS — N289 Disorder of kidney and ureter, unspecified: Secondary | ICD-10-CM | POA: Diagnosis not present

## 2021-12-23 DIAGNOSIS — I2510 Atherosclerotic heart disease of native coronary artery without angina pectoris: Secondary | ICD-10-CM

## 2021-12-23 DIAGNOSIS — Z8719 Personal history of other diseases of the digestive system: Secondary | ICD-10-CM | POA: Diagnosis not present

## 2021-12-23 DIAGNOSIS — M179 Osteoarthritis of knee, unspecified: Secondary | ICD-10-CM | POA: Diagnosis present

## 2021-12-23 DIAGNOSIS — J440 Chronic obstructive pulmonary disease with acute lower respiratory infection: Secondary | ICD-10-CM | POA: Diagnosis not present

## 2021-12-23 DIAGNOSIS — Z833 Family history of diabetes mellitus: Secondary | ICD-10-CM

## 2021-12-23 DIAGNOSIS — I4821 Permanent atrial fibrillation: Secondary | ICD-10-CM | POA: Diagnosis not present

## 2021-12-23 DIAGNOSIS — I21A1 Myocardial infarction type 2: Secondary | ICD-10-CM | POA: Diagnosis not present

## 2021-12-23 DIAGNOSIS — Z955 Presence of coronary angioplasty implant and graft: Secondary | ICD-10-CM

## 2021-12-23 DIAGNOSIS — E785 Hyperlipidemia, unspecified: Secondary | ICD-10-CM | POA: Diagnosis not present

## 2021-12-23 DIAGNOSIS — I4892 Unspecified atrial flutter: Secondary | ICD-10-CM | POA: Diagnosis present

## 2021-12-23 DIAGNOSIS — M25461 Effusion, right knee: Secondary | ICD-10-CM | POA: Diagnosis not present

## 2021-12-23 DIAGNOSIS — I5032 Chronic diastolic (congestive) heart failure: Secondary | ICD-10-CM | POA: Diagnosis not present

## 2021-12-23 DIAGNOSIS — Z823 Family history of stroke: Secondary | ICD-10-CM

## 2021-12-23 DIAGNOSIS — I4811 Longstanding persistent atrial fibrillation: Secondary | ICD-10-CM | POA: Diagnosis not present

## 2021-12-23 DIAGNOSIS — N179 Acute kidney failure, unspecified: Secondary | ICD-10-CM | POA: Diagnosis not present

## 2021-12-23 DIAGNOSIS — I5043 Acute on chronic combined systolic (congestive) and diastolic (congestive) heart failure: Secondary | ICD-10-CM | POA: Diagnosis not present

## 2021-12-23 DIAGNOSIS — I314 Cardiac tamponade: Secondary | ICD-10-CM | POA: Diagnosis not present

## 2021-12-23 DIAGNOSIS — K219 Gastro-esophageal reflux disease without esophagitis: Secondary | ICD-10-CM | POA: Diagnosis not present

## 2021-12-23 DIAGNOSIS — Z87891 Personal history of nicotine dependence: Secondary | ICD-10-CM

## 2021-12-23 DIAGNOSIS — M659 Synovitis and tenosynovitis, unspecified: Secondary | ICD-10-CM | POA: Diagnosis present

## 2021-12-23 DIAGNOSIS — I429 Cardiomyopathy, unspecified: Secondary | ICD-10-CM | POA: Diagnosis not present

## 2021-12-23 DIAGNOSIS — Z5309 Procedure and treatment not carried out because of other contraindication: Secondary | ICD-10-CM | POA: Diagnosis present

## 2021-12-23 DIAGNOSIS — I472 Ventricular tachycardia, unspecified: Secondary | ICD-10-CM | POA: Diagnosis not present

## 2021-12-23 DIAGNOSIS — Z808 Family history of malignant neoplasm of other organs or systems: Secondary | ICD-10-CM

## 2021-12-23 DIAGNOSIS — E119 Type 2 diabetes mellitus without complications: Secondary | ICD-10-CM

## 2021-12-23 DIAGNOSIS — Z794 Long term (current) use of insulin: Secondary | ICD-10-CM

## 2021-12-23 DIAGNOSIS — D649 Anemia, unspecified: Secondary | ICD-10-CM | POA: Diagnosis not present

## 2021-12-23 DIAGNOSIS — Z7951 Long term (current) use of inhaled steroids: Secondary | ICD-10-CM

## 2021-12-23 DIAGNOSIS — R112 Nausea with vomiting, unspecified: Secondary | ICD-10-CM | POA: Diagnosis not present

## 2021-12-23 DIAGNOSIS — R109 Unspecified abdominal pain: Secondary | ICD-10-CM | POA: Diagnosis not present

## 2021-12-23 DIAGNOSIS — R918 Other nonspecific abnormal finding of lung field: Secondary | ICD-10-CM | POA: Diagnosis not present

## 2021-12-23 DIAGNOSIS — M79642 Pain in left hand: Secondary | ICD-10-CM | POA: Diagnosis not present

## 2021-12-23 DIAGNOSIS — I700 Atherosclerosis of aorta: Secondary | ICD-10-CM | POA: Diagnosis not present

## 2021-12-23 DIAGNOSIS — Z79899 Other long term (current) drug therapy: Secondary | ICD-10-CM

## 2021-12-23 LAB — LIPID PANEL
Cholesterol: 120 mg/dL (ref 0–200)
HDL: 55 mg/dL (ref >40)
LDL Cholesterol: 56 mg/dL (ref 0–99)
Total CHOL/HDL Ratio: 2.2 RATIO
Triglycerides: 46 mg/dL (ref <150)
VLDL: 9 mg/dL (ref 0–40)

## 2021-12-23 LAB — ECHOCARDIOGRAM COMPLETE
AR max vel: 2.35 cm2
AV Peak grad: 6.9 mmHg
Ao pk vel: 1.31 m/s
Area-P 1/2: 2.22 cm2
Calc EF: 33.1 %
Height: 67 in
MV VTI: 1.04 cm2
S' Lateral: 4.3 cm
Single Plane A2C EF: 30.7 %
Single Plane A4C EF: 33.7 %
Weight: 2896 oz

## 2021-12-23 LAB — CBC
HCT: 35.5 % — ABNORMAL LOW (ref 36.0–46.0)
Hemoglobin: 12 g/dL (ref 12.0–15.0)
MCH: 26.1 pg (ref 26.0–34.0)
MCHC: 33.8 g/dL (ref 30.0–36.0)
MCV: 77.3 fL — ABNORMAL LOW (ref 80.0–100.0)
Platelets: 184 10*3/uL (ref 150–400)
RBC: 4.59 MIL/uL (ref 3.87–5.11)
RDW: 16.2 % — ABNORMAL HIGH (ref 11.5–15.5)
WBC: 7 10*3/uL (ref 4.0–10.5)
nRBC: 0 % (ref 0.0–0.2)

## 2021-12-23 LAB — T4, FREE: Free T4: 1.15 ng/dL — ABNORMAL HIGH (ref 0.61–1.12)

## 2021-12-23 LAB — MRSA NEXT GEN BY PCR, NASAL: MRSA by PCR Next Gen: NOT DETECTED

## 2021-12-23 LAB — BASIC METABOLIC PANEL
Anion gap: 15 (ref 5–15)
BUN: 21 mg/dL (ref 8–23)
CO2: 16 mmol/L — ABNORMAL LOW (ref 22–32)
Calcium: 9.9 mg/dL (ref 8.9–10.3)
Chloride: 108 mmol/L (ref 98–111)
Creatinine, Ser: 1.87 mg/dL — ABNORMAL HIGH (ref 0.44–1.00)
GFR, Estimated: 26 mL/min — ABNORMAL LOW (ref >60)
Glucose, Bld: 191 mg/dL — ABNORMAL HIGH (ref 70–99)
Potassium: 4 mmol/L (ref 3.5–5.1)
Sodium: 139 mmol/L (ref 135–145)

## 2021-12-23 LAB — HEMOGLOBIN A1C
Hgb A1c MFr Bld: 5 % (ref 4.8–5.6)
Mean Plasma Glucose: 96.8 mg/dL

## 2021-12-23 LAB — HEPARIN LEVEL (UNFRACTIONATED): Heparin Unfractionated: 1.09 IU/mL — ABNORMAL HIGH (ref 0.30–0.70)

## 2021-12-23 LAB — GLUCOSE, CAPILLARY: Glucose-Capillary: 159 mg/dL — ABNORMAL HIGH (ref 70–99)

## 2021-12-23 LAB — LIPASE, BLOOD: Lipase: 28 U/L (ref 11–51)

## 2021-12-23 LAB — HEPATIC FUNCTION PANEL
ALT: 9 U/L (ref 0–44)
AST: 15 U/L (ref 15–41)
Albumin: 4.2 g/dL (ref 3.5–5.0)
Alkaline Phosphatase: 68 U/L (ref 38–126)
Bilirubin, Direct: 0.2 mg/dL (ref 0.0–0.2)
Indirect Bilirubin: 0.5 mg/dL (ref 0.3–0.9)
Total Bilirubin: 0.7 mg/dL (ref 0.3–1.2)
Total Protein: 7.7 g/dL (ref 6.5–8.1)

## 2021-12-23 LAB — BRAIN NATRIURETIC PEPTIDE: B Natriuretic Peptide: 230.4 pg/mL — ABNORMAL HIGH (ref 0.0–100.0)

## 2021-12-23 LAB — APTT: aPTT: 83 seconds — ABNORMAL HIGH (ref 24–36)

## 2021-12-23 LAB — TROPONIN I (HIGH SENSITIVITY)
Troponin I (High Sensitivity): 187 ng/L (ref <18)
Troponin I (High Sensitivity): 346 ng/L (ref <18)

## 2021-12-23 LAB — TSH: TSH: 3.434 u[IU]/mL (ref 0.350–4.500)

## 2021-12-23 LAB — MAGNESIUM: Magnesium: 2 mg/dL (ref 1.7–2.4)

## 2021-12-23 SURGERY — INVASIVE LAB ABORTED CASE

## 2021-12-23 MED ORDER — AMLODIPINE BESYLATE 5 MG PO TABS
5.0000 mg | ORAL_TABLET | Freq: Two times a day (BID) | ORAL | Status: DC
  Administered 2021-12-23 – 2021-12-25 (×4): 5 mg via ORAL
  Filled 2021-12-23 (×4): qty 1

## 2021-12-23 MED ORDER — HYDRALAZINE HCL 50 MG PO TABS
75.0000 mg | ORAL_TABLET | Freq: Three times a day (TID) | ORAL | Status: DC
  Administered 2021-12-23 – 2021-12-25 (×5): 75 mg via ORAL
  Filled 2021-12-23 (×6): qty 1

## 2021-12-23 MED ORDER — MONTELUKAST SODIUM 10 MG PO TABS
10.0000 mg | ORAL_TABLET | Freq: Every day | ORAL | Status: DC
  Administered 2021-12-23 – 2022-01-03 (×12): 10 mg via ORAL
  Filled 2021-12-23 (×12): qty 1

## 2021-12-23 MED ORDER — HEPARIN (PORCINE) IN NACL 1000-0.9 UT/500ML-% IV SOLN
INTRAVENOUS | Status: AC
  Filled 2021-12-23: qty 1000

## 2021-12-23 MED ORDER — NITROGLYCERIN IN D5W 200-5 MCG/ML-% IV SOLN
0.0000 ug/min | INTRAVENOUS | Status: DC
  Administered 2021-12-23 – 2021-12-24 (×2): 5 ug/min via INTRAVENOUS
  Administered 2021-12-26: 15 ug/min via INTRAVENOUS
  Filled 2021-12-23 (×2): qty 250

## 2021-12-23 MED ORDER — ACETAMINOPHEN 325 MG PO TABS
650.0000 mg | ORAL_TABLET | Freq: Four times a day (QID) | ORAL | Status: DC | PRN
  Administered 2021-12-28 – 2022-01-01 (×6): 650 mg via ORAL
  Filled 2021-12-23 (×6): qty 2

## 2021-12-23 MED ORDER — HEPARIN (PORCINE) 25000 UT/250ML-% IV SOLN
1200.0000 [IU]/h | INTRAVENOUS | Status: DC
  Administered 2021-12-23 – 2021-12-26 (×5): 1000 [IU]/h via INTRAVENOUS
  Administered 2021-12-27: 1100 [IU]/h via INTRAVENOUS
  Filled 2021-12-23 (×5): qty 250

## 2021-12-23 MED ORDER — PANTOPRAZOLE SODIUM 40 MG PO TBEC: 40.0000 mg | DELAYED_RELEASE_TABLET | Freq: Every day | ORAL | Status: DC

## 2021-12-23 MED ORDER — POTASSIUM CHLORIDE CRYS ER 10 MEQ PO TBCR
10.0000 meq | EXTENDED_RELEASE_TABLET | Freq: Two times a day (BID) | ORAL | Status: DC
  Administered 2021-12-23: 10 meq via ORAL
  Filled 2021-12-23: qty 1

## 2021-12-23 MED ORDER — HEPARIN SODIUM (PORCINE) 1000 UNIT/ML IJ SOLN
INTRAMUSCULAR | Status: AC
  Filled 2021-12-23: qty 10

## 2021-12-23 MED ORDER — SODIUM CHLORIDE 0.9 % IV SOLN: INTRAVENOUS | Status: DC

## 2021-12-23 MED ORDER — INSULIN ASPART 100 UNIT/ML IJ SOLN
0.0000 [IU] | Freq: Every day | INTRAMUSCULAR | Status: DC
  Administered 2022-01-01 – 2022-01-02 (×2): 2 [IU] via SUBCUTANEOUS
  Administered 2022-01-03: 3 [IU] via SUBCUTANEOUS

## 2021-12-23 MED ORDER — ALUM & MAG HYDROXIDE-SIMETH 200-200-20 MG/5ML PO SUSP
30.0000 mL | Freq: Once | ORAL | Status: AC
  Administered 2021-12-23: 30 mL via ORAL
  Filled 2021-12-23: qty 30

## 2021-12-23 MED ORDER — VERAPAMIL HCL 2.5 MG/ML IV SOLN
INTRAVENOUS | Status: AC
  Filled 2021-12-23: qty 2

## 2021-12-23 MED ORDER — FLUTICASONE FUROATE-VILANTEROL 200-25 MCG/ACT IN AEPB
1.0000 | INHALATION_SPRAY | Freq: Every day | RESPIRATORY_TRACT | Status: DC
  Administered 2021-12-23 – 2022-01-04 (×13): 1 via RESPIRATORY_TRACT
  Filled 2021-12-23 (×2): qty 28

## 2021-12-23 MED ORDER — ASPIRIN 81 MG PO CHEW
81.0000 mg | CHEWABLE_TABLET | ORAL | Status: AC
  Administered 2021-12-23: 81 mg via ORAL
  Filled 2021-12-23: qty 1

## 2021-12-23 MED ORDER — HEPARIN BOLUS VIA INFUSION
4000.0000 [IU] | Freq: Once | INTRAVENOUS | Status: AC
  Administered 2021-12-23: 4000 [IU] via INTRAVENOUS

## 2021-12-23 MED ORDER — METOPROLOL TARTRATE 12.5 MG HALF TABLET
12.5000 mg | ORAL_TABLET | Freq: Two times a day (BID) | ORAL | Status: DC
  Administered 2021-12-23 – 2021-12-25 (×4): 12.5 mg via ORAL
  Filled 2021-12-23 (×4): qty 1

## 2021-12-23 MED ORDER — AEROCHAMBER PLUS FLO-VU MEDIUM MISC
1.0000 | Freq: Once | Status: AC
  Administered 2021-12-23: 1
  Filled 2021-12-23: qty 1

## 2021-12-23 MED ORDER — INSULIN ASPART 100 UNIT/ML IJ SOLN
0.0000 [IU] | Freq: Three times a day (TID) | INTRAMUSCULAR | Status: DC
  Administered 2021-12-24: 1 [IU] via SUBCUTANEOUS
  Administered 2021-12-24 – 2021-12-27 (×6): 2 [IU] via SUBCUTANEOUS
  Administered 2021-12-28 (×2): 1 [IU] via SUBCUTANEOUS
  Administered 2021-12-28 – 2021-12-29 (×2): 2 [IU] via SUBCUTANEOUS
  Administered 2021-12-29: 1 [IU] via SUBCUTANEOUS
  Administered 2021-12-30 (×3): 2 [IU] via SUBCUTANEOUS
  Administered 2021-12-31: 1 [IU] via SUBCUTANEOUS
  Administered 2021-12-31: 2 [IU] via SUBCUTANEOUS
  Administered 2022-01-01: 3 [IU] via SUBCUTANEOUS
  Administered 2022-01-02: 1 [IU] via SUBCUTANEOUS
  Administered 2022-01-02 (×2): 2 [IU] via SUBCUTANEOUS
  Administered 2022-01-03 (×3): 3 [IU] via SUBCUTANEOUS
  Administered 2022-01-04: 2 [IU] via SUBCUTANEOUS
  Administered 2022-01-04: 1 [IU] via SUBCUTANEOUS

## 2021-12-23 MED ORDER — SODIUM CHLORIDE 0.9% FLUSH
3.0000 mL | Freq: Two times a day (BID) | INTRAVENOUS | Status: DC
  Administered 2021-12-23 – 2022-01-03 (×10): 3 mL via INTRAVENOUS

## 2021-12-23 MED ORDER — PERFLUTREN LIPID MICROSPHERE
1.0000 mL | INTRAVENOUS | Status: AC | PRN
  Administered 2021-12-23: 2 mL via INTRAVENOUS
  Filled 2021-12-23: qty 10

## 2021-12-23 MED ORDER — SODIUM CHLORIDE 0.9% FLUSH: 3.0000 mL | INTRAVENOUS | Status: DC | PRN

## 2021-12-23 MED ORDER — ALBUTEROL SULFATE HFA 108 (90 BASE) MCG/ACT IN AERS
2.0000 | INHALATION_SPRAY | RESPIRATORY_TRACT | Status: DC | PRN
  Administered 2021-12-23 – 2021-12-27 (×5): 2 via RESPIRATORY_TRACT
  Filled 2021-12-23 (×2): qty 6.7

## 2021-12-23 MED ORDER — ACETAMINOPHEN 325 MG PO TABS: 650.0000 mg | ORAL_TABLET | ORAL | Status: DC | PRN

## 2021-12-23 MED ORDER — LIDOCAINE HCL (PF) 1 % IJ SOLN
INTRAMUSCULAR | Status: AC
  Filled 2021-12-23: qty 30

## 2021-12-23 MED ORDER — SODIUM CHLORIDE 0.9 % IV SOLN: 250.0000 mL | INTRAVENOUS | Status: DC | PRN

## 2021-12-23 MED ORDER — ASPIRIN EC 81 MG PO TBEC
81.0000 mg | DELAYED_RELEASE_TABLET | Freq: Every day | ORAL | Status: DC
  Administered 2021-12-23 – 2022-01-04 (×13): 81 mg via ORAL
  Filled 2021-12-23 (×12): qty 1

## 2021-12-23 MED ORDER — LATANOPROST 0.005 % OP SOLN
1.0000 [drp] | Freq: Every day | OPHTHALMIC | Status: DC
  Administered 2021-12-25 – 2022-01-03 (×10): 1 [drp] via OPHTHALMIC
  Filled 2021-12-23: qty 2.5

## 2021-12-23 MED ORDER — NITROGLYCERIN 0.4 MG SL SUBL: 0.4000 mg | SUBLINGUAL_TABLET | SUBLINGUAL | Status: DC | PRN

## 2021-12-23 MED ORDER — LIDOCAINE VISCOUS HCL 2 % MT SOLN
15.0000 mL | Freq: Once | OROMUCOSAL | Status: AC
  Administered 2021-12-23: 15 mL via ORAL
  Filled 2021-12-23: qty 15

## 2021-12-23 MED ORDER — ATORVASTATIN CALCIUM 80 MG PO TABS
80.0000 mg | ORAL_TABLET | Freq: Every day | ORAL | Status: DC
  Administered 2021-12-24 – 2022-01-04 (×12): 80 mg via ORAL
  Filled 2021-12-23 (×12): qty 1

## 2021-12-23 MED ORDER — ALBUTEROL SULFATE (2.5 MG/3ML) 0.083% IN NEBU
3.0000 mL | INHALATION_SOLUTION | RESPIRATORY_TRACT | Status: DC | PRN
  Administered 2021-12-24 – 2022-01-03 (×11): 3 mL via RESPIRATORY_TRACT
  Filled 2021-12-23 (×14): qty 3

## 2021-12-23 MED ORDER — FUROSEMIDE 10 MG/ML IJ SOLN
INTRAMUSCULAR | Status: DC | PRN
  Administered 2021-12-23: 40 mg via INTRAVENOUS

## 2021-12-23 MED ORDER — PANTOPRAZOLE SODIUM 40 MG IV SOLR
40.0000 mg | INTRAVENOUS | Status: DC
  Administered 2021-12-23 – 2021-12-24 (×2): 40 mg via INTRAVENOUS
  Filled 2021-12-23 (×2): qty 10

## 2021-12-23 MED ORDER — ONDANSETRON HCL 4 MG/2ML IJ SOLN
4.0000 mg | Freq: Four times a day (QID) | INTRAMUSCULAR | Status: DC | PRN
  Administered 2021-12-23 – 2021-12-24 (×4): 4 mg via INTRAVENOUS
  Filled 2021-12-23 (×4): qty 2

## 2021-12-23 MED ORDER — SODIUM CHLORIDE 0.9 % IV BOLUS
1000.0000 mL | Freq: Once | INTRAVENOUS | Status: AC
  Administered 2021-12-23: 1000 mL via INTRAVENOUS

## 2021-12-23 MED ORDER — FUROSEMIDE 10 MG/ML IJ SOLN
40.0000 mg | Freq: Two times a day (BID) | INTRAMUSCULAR | Status: DC
  Administered 2021-12-23 – 2021-12-24 (×2): 40 mg via INTRAVENOUS
  Filled 2021-12-23 (×2): qty 4

## 2021-12-23 MED ORDER — FUROSEMIDE 10 MG/ML IJ SOLN
INTRAMUSCULAR | Status: AC
  Filled 2021-12-23: qty 4

## 2021-12-23 SURGICAL SUPPLY — 7 items
GLIDESHEATH SLEND SS 6F .021 (SHEATH) IMPLANT
GUIDEWIRE INQWIRE 1.5J.035X260 (WIRE) IMPLANT
INQWIRE 1.5J .035X260CM (WIRE)
KIT HEART LEFT (KITS) ×2 IMPLANT
PACK CARDIAC CATHETERIZATION (CUSTOM PROCEDURE TRAY) ×2 IMPLANT
TRANSDUCER W/STOPCOCK (MISCELLANEOUS) ×2 IMPLANT
TUBING CIL FLEX 10 FLL-RA (TUBING) ×2 IMPLANT

## 2021-12-23 NOTE — ED Notes (Signed)
Called [facility] and s/w Phil - Advised patient has a bed assigned at [facility] [phone]) and is ready for transport.  Phil advised he was sending transport now. ?

## 2021-12-23 NOTE — Progress Notes (Signed)
ANTICOAGULATION CONSULT NOTE ? ?Pharmacy Consult for Heparin ?Indication: chest pain/ACS ? ?Allergies  ?Allergen Reactions  ? Aspirin Shortness Of Breath and Other (See Comments)  ?  Caused asthma symptoms  ? Ramipril Cough  ? ? ?Patient Measurements: ?Height: 5\' 7"  (170.2 cm) ?Weight: 82.1 kg (181 lb) ?IBW/kg (Calculated) : 61.6 ?Heparin Dosing Weight: 75 kg ? ?Vital Signs: ?Temp: 98.2 ?F (36.8 ?C) (03/24 1606) ?Temp Source: Oral (03/24 1606) ?BP: 140/69 (03/24 1606) ?Pulse Rate: 60 (03/24 1606) ? ?Labs: ?Recent Labs  ?  12/23/21 ?0426 12/23/21 ?0617 12/23/21 ?1615  ?HGB 12.0  --   --   ?HCT 35.5*  --   --   ?PLT 184  --   --   ?APTT  --   --  83*  ?HEPARINUNFRC  --   --  1.09*  ?CREATININE 1.87*  --   --   ?TROPONINIHS 187* 346*  --   ? ? ? ?Estimated Creatinine Clearance: 25.1 mL/min (A) (by C-G formula based on SCr of 1.87 mg/dL (H)). ? ?  ? ?Assessment: ?86 y.o. female with chest pain to continue on IV heparin.  Patient was on Eliquis PTA for history of Afib, so will use aPTT to guide heparin dosing until aPTT and heparin level correlate.  Last dose of Eliquis was on 3/23 at 1200. ? ?aPTT therapeutic at 83 sec; heparin level elevated as expected.  No issues reported. ? ?Goal of Therapy:  ?Heparin level 0.3-0.7 units/ml ?Monitor platelets by anticoagulation protocol: Yes ?  ?Plan:  ?Continue heparin infusion at 1000 units/hr ?F/u AM labs ? ?Russ Looper D. Mina Marble, PharmD, BCPS, BCCCP ?12/23/2021, 7:03 PM ? ?

## 2021-12-23 NOTE — ED Provider Notes (Signed)
?[place] EMERGENCY DEPT ?Provider Note ? ? ?CSN: [id number] ?Arrival date & time: 12/23/21  0405 ? ?  ? ?History ? ?Chief Complaint  ?Patient presents with  ? Chest Pain  ? ? ?[name] is a 86 y.o. female. ? ?Patient is an 86 year old female with past medical history of pacemaker secondary to complete heart block, hyperlipidemia, GERD, diverticulitis.  Patient presenting today with complaints of chest/epigastric discomfort.  This started approximately 10 PM while she was doing word puzzles.  She took some antacid with some relief, then symptoms worsened again this evening and presents for evaluation of them.  She describes a pressure to her upper abdomen with no shortness of breath, nausea, diaphoresis, or radiation to the arm or jaw.  She does state that she felt "warm".  She denies any recent exertional symptoms. ? ?The history is provided by the patient.  ? ?  ? ?Home Medications ?Prior to Admission medications   ?Medication Sig Start Date End Date Taking? Authorizing Provider  ?montelukast (SINGULAIR) 10 MG tablet TAKE 1 TABLET BY MOUTH EVERYDAY AT BEDTIME 12/22/21   Tanda Rockers, MD  ?acetaminophen (TYLENOL) 325 MG tablet Take 650 mg by mouth every 6 (six) hours as needed for pain.    [provider]  ?albuterol (PROAIR HFA) 108 (90 Base) MCG/ACT inhaler Inhale 2 puffs into the lungs every 4 (four) hours as needed for wheezing. 05/05/19 11/04/21  Tanda Rockers, MD  ?amLODipine (NORVASC) 5 MG tablet TAKE 1 TABLET BY MOUTH TWICE A DAY 09/27/21   Fay Records, MD  ?azithromycin [facility]) 250 MG tablet Take 2 on day one then 1 daily x 4 days 07/04/21   Tanda Rockers, MD  ?BD INSULIN SYRINGE U/F 31G X 5/16" 1 ML MISC Inject into the skin 2 (two) times daily. 04/11/21   [provider]  ?Blood Glucose Monitoring Suppl (ONE TOUCH ULTRA 2) W/DEVICE KIT Check blood sugar 3 times per day. 07/24/14   Renato Shin, MD  ?budesonide-formoterol [facility]) 160-4.5 MCG/ACT inhaler TAKE  2 PUFFS FIRST THING IN AM AND THEN ANOTHER 2 PUFFS ABOUT 12 HOURS LATER. 07/13/21   Tanda Rockers, MD  ?cetirizine (ZYRTEC ALLERGY) 10 MG tablet Take 1 tablet (10 mg total) by mouth daily as needed for allergies. 05/04/20   Tanda Rockers, MD  ?diclofenac Sodium (VOLTAREN) 1 % GEL as needed. 01/27/21   [provider]  ?ELIQUIS 2.5 MG TABS tablet TAKE 1 TABLET BY MOUTH TWICE A DAY 11/21/21   Deboraha Sprang, MD  ?furosemide (LASIX) 40 MG tablet TAKE 1 TABLET BY MOUTH EVERY DAY ?Patient taking differently: as needed. 02/04/21   Fay Records, MD  ?glucose blood [facility] VERIO) test strip 1 each by Other route 2 (two) times daily. And lancets 2/day 12/13/21   Renato Shin, MD  ?glucose blood test strip Check blood sugar twice a day 04/11/21   Renato Shin, MD  ?hydrALAZINE (APRESOLINE) 25 MG tablet TAKE 3 TABLETS (75 MG TOTAL) BY MOUTH 3 (THREE) TIMES DAILY. 10/20/21   Fay Records, MD  ?insulin lispro (HUMALOG) 100 UNIT/ML injection 5 UNITS WITH BREAKFAST, AND 18 UNITS WITH SUPPER 12/12/21   Renato Shin, MD  ?latanoprost (XALATAN) 0.005 % ophthalmic solution Place 1 drop into both eyes nightly. 03/16/21   [provider]  ?omeprazole (PRILOSEC) 40 MG capsule Take 1 capsule (40 mg total) by mouth daily. 07/05/21   Tanda Rockers, MD  ?potassium chloride (KLOR-CON) 10 MEQ tablet TAKE  1 TABLET BY MOUTH EVERY DAY 09/20/21   Deboraha Sprang, MD  ?pravastatin (PRAVACHOL) 40 MG tablet Take 1 tablet (40 mg total) by mouth daily. 08/30/21   [name], [place]  ?STUDY - ASPIRE - apixaban 2.5 mg or placebo tablet (PI-Sethi)  08/30/20   [provider]  ?Syringe/Needle, Disp, (SYRINGE 3CC/25GX1") 25G X 1" 3 ML MISC Use to inject into the skin 2x a day 04/11/21   Renato Shin, MD  ?   ? ?Allergies    ?Aspirin and Ramipril   ? ?Review of Systems   ?Review of Systems  ?All other systems reviewed and are negative. ? ?Physical Exam ?Updated Vital Signs ?BP 138/65   Pulse 66   Temp 97.9 ?F (36.6 ?C)    Resp (!) 22   Ht '5\' 7"'  (1.702 m)   Wt 82.1 kg   SpO2 99%   BMI 28.35 kg/m?  ?Physical Exam ?Vitals and nursing note reviewed.  ?Constitutional:   ?   General: She is not in acute distress. ?   Appearance: She is well-developed. She is not diaphoretic.  ?HENT:  ?   Head: Normocephalic and atraumatic.  ?Cardiovascular:  ?   Rate and Rhythm: Normal rate and regular rhythm.  ?   Heart sounds: No murmur heard. ?  No friction rub. No gallop.  ?Pulmonary:  ?   Effort: Pulmonary effort is normal. No respiratory distress.  ?   Breath sounds: Normal breath sounds. No wheezing.  ?Abdominal:  ?   General: Bowel sounds are normal. There is no distension.  ?   Palpations: Abdomen is soft.  ?   Tenderness: There is abdominal tenderness. There is no guarding or rebound.  ?   Comments: There is tenderness to palpation in the epigastrium.  ?Musculoskeletal:     ?   General: Normal range of motion.  ?   Cervical back: Normal range of motion and neck supple.  ?   Right lower leg: No tenderness. No edema.  ?   Left lower leg: No tenderness. No edema.  ?Skin: ?   General: Skin is warm and dry.  ?Neurological:  ?   General: No focal deficit present.  ?   Mental Status: She is alert and oriented to person, place, and time.  ? ? ?ED Results / Procedures / Treatments   ?Labs ?(all labs ordered are listed, but only abnormal results are displayed) ?Labs Reviewed  ?CBC - Abnormal; Notable for the following components:  ?    Result Value  ? HCT 35.5 (*)   ? MCV 77.3 (*)   ? RDW 16.2 (*)   ? All other components within normal limits  ?BASIC METABOLIC PANEL  ?HEPATIC FUNCTION PANEL  ?LIPASE, BLOOD  ?TROPONIN I (HIGH SENSITIVITY)  ? ? ?EKG ?EKG Interpretation ? ?Date/Time:  Friday December 23 2021 04:11:19 EDT ?Ventricular Rate:  74 ?PR Interval:    ?QRS Duration: 153 ?QT Interval:  464 ?QTC Calculation: 515 ?R Axis:   -38 ?Text Interpretation: VENTRICULAR PACED RHYTHM Ventricular premature complex Confirmed by Veryl Speak [phone]) on  12/23/2021 4:23:16 AM ? ?Radiology ?No results found. ? ?Procedures ?Procedures  ? ? ?Medications Ordered in ED ?Medications  ?alum & mag hydroxide-simeth (MAALOX/MYLANTA) 200-200-20 MG/5ML suspension 30 mL (has no administration in time range)  ?  And  ?lidocaine (XYLOCAINE) 2 % viscous mouth solution 15 mL (has no administration in time range)  ? ? ?ED Course/ Medical Decision Making/ A&P ? ?Patient presenting here with  complaints of chest discomfort as described in the HPI.  Patient's initial troponin mildly elevated at 187, the significance of which I am uncertain.  Patient to undergo a repeat troponin with disposition pending results of this study.  Care signed out to oncoming provider. ? ?Final Clinical Impression(s) / ED Diagnoses ?Final diagnoses:  ?None  ? ? ?Rx / DC Orders ?ED Discharge Orders   ? ? None  ? ?  ? ? ?  ?Veryl Speak, MD ?12/30/21 2303 ? ?

## 2021-12-23 NOTE — ED Provider Notes (Signed)
Patient signed out to me is pending cardiology callback. ? ?My evaluation the patient appears comfortable at this time, she states her pain is nearly gone now.  Vital signs remained stable. ?She states that she takes Eliquis for her history of irregular heartbeat. ? ?Patient started on heparin.  No aspirin given at this time as she states that it causes her to have severe breathing difficulties. ? ?Case discussed with cardiology Dr.Nahser for admission. ? ?  ?Luna Fuse, MD ?12/23/21 [phone] ? ?

## 2021-12-23 NOTE — ED Notes (Signed)
Patient transported to X-ray 

## 2021-12-23 NOTE — TOC Progression Note (Signed)
Transition of Care (TOC) - Progression Note  ? ? ?Patient Details  ?Name: [name] ?MRN: [id number] ?Date of Birth: 08/29/1936 ? ?Transition of Care (TOC) CM/SW Contact  ?Angelita Ingles, RN ?Phone [phone] ? ?12/23/2021, 2:57 PM ? ?Clinical Narrative:    ? ?Transition of Care (TOC) Screening Note ? ? ?Patient Details  ?Name: [name] ?Date of Birth: Nov 15, 1935 ? ? ?Transition of Care (TOC) CM/SW Contact:    ?Angelita Ingles, RN ?Phone Number: ?12/23/2021, 2:57 PM ? ? ? ?Transition of Care Department [facility]) has reviewed patient and no TOC needs have been identified at this time. We will continue to monitor patient advancement through interdisciplinary progression rounds. If new patient transition needs arise, please place a TOC consult. ? ? ? ? ?  ?  ? ?Expected Discharge Plan and Services ?  ?  ?  ?  ?  ?                ?  ?  ?  ?  ?  ?  ?  ?  ?  ?  ? ? ?Social Determinants of Health (SDOH) Interventions ?  ? ?Readmission Risk Interventions ?   ? View : No data to display.  ?  ?  ?  ? ? ?

## 2021-12-23 NOTE — H&P (Signed)
?Cardiology Admission Note  ? ?Patient ID: [name] ?MRN: [id number]; DOB: 02/09/1936 ? ?Admit date: 12/23/2021 ?Date of Consult: 12/23/2021 ? ?PCP:  [name], [place] ?  ?[facility] Providers ?Cardiologist:  Dorris Carnes, MD  ?    ? ? ?Patient Profile:  ? ?[name] is a 86 y.o. female with a hx of DM, HLD, complete heart block - s/p pacer  who is being seen 12/23/2021 for the evaluation of  acute coronary syndrome  at the request of Dr. Stark Jock ( [facility] [place])  ? ?Hx of Afib - took her last dose of Eliquis last night.  Home dose is 2.5 BID  ?Starting having midsternal chest pain and shortness of breath last night.  She originally thought it was indigestion and tried antacids.  This did not relieve the pain.  Eventually she went to the [facility] emergency room. ? ?She was found to have ST segment depression in the anterior lateral leads.  Troponins were elevated. ? ?She was given nitroglycerin, IV heparin.  The pain improved but did not completely resolve.  She was transferred to [facility].  She continues to have some chest ache. ? ?The chest pain has been intermittent off and on since last night.  The pains typically last for several minutes and then eased up slightly. ?She has radiation up into her neck.  Is associated with a very hot sensation.  She has had some increased shortness of breath. ? ?She has a history of asthma.  She is actively wheezing this morning. ?History of CKD.  Her baseline creatinine is 1.7-1.8. ? ?History of Present Illness:  ? ?[name]   ? ? ?Past Medical History:  ?Diagnosis Date  ? Allergic rhinitis   ? Anterior chest wall pain   ? Anxiety   ? Asthma   ? Chronic diastolic CHF (congestive heart failure) ([place])   ? Echo 01/2020: EF 60-65, normal wall motion, mild LVH, normal RV SF, RVSP 52.6 (moderate elevation), severe LAE, moderate RAE, trivial MR, mild MS (mean gradient 5.5 mmHg), mild aortic valve sclerosis (no AS); elevated E/e' c/w elevated LVEDP  ?  Cough   ? DM type 2 (diabetes mellitus, type 2) ([place])   ? GERD (gastroesophageal reflux disease)   ? History of diverticulitis of colon   ? HTN (hypertension)   ? Hyperlipidemia   ? IBS (irritable bowel syndrome)   ? Iron deficiency anemia   ? Osteoporosis, unspecified   ? Renal insufficiency 07/31/2017  ? UTI (urinary tract infection)   ? ? ?Past Surgical History:  ?Procedure Laterality Date  ? CARDIOVASCULAR STRESS TEST  02/25/04  ? ESOPHAGOGASTRODUODENOSCOPY  12/26/01  ? PACEMAKER IMPLANT N/A 08/02/2020  ? Procedure: PACEMAKER IMPLANT;  Surgeon: Deboraha Sprang, MD;  Location: [facility];  Service: Cardiovascular;  Laterality: N/A;  ? RIGHT OOPHORECTOMY  jan 2010  ?  ? ?Home Medications:  ?Prior to Admission medications   ?Medication Sig Start Date End Date Taking? Authorizing Provider  ?montelukast (SINGULAIR) 10 MG tablet TAKE 1 TABLET BY MOUTH EVERYDAY AT BEDTIME ?Patient taking differently: Take 10 mg by mouth at bedtime. 12/22/21   Tanda Rockers, MD  ?acetaminophen (TYLENOL) 325 MG tablet Take 650 mg by mouth every 6 (six) hours as needed for pain.    [provider]  ?albuterol (PROAIR HFA) 108 (90 Base) MCG/ACT inhaler Inhale 2 puffs into the lungs every 4 (four) hours as needed for wheezing. 05/05/19 02/04/22  Tanda Rockers, MD  ?amLODipine (NORVASC) 5 MG tablet TAKE 1 TABLET BY MOUTH TWICE A DAY ?Patient taking differently: Take 5 mg by mouth in the morning and at bedtime. 09/27/21   Fay Records, MD  ?azithromycin (ZITHROMAX) 250 MG tablet Take 2 on day one then 1 daily x 4 days ?Patient not taking: Reported on 12/23/2021 07/04/21   Tanda Rockers, MD  ?Blood Glucose Monitoring Suppl (ONE TOUCH ULTRA 2) W/DEVICE KIT Check blood sugar 3 times per day. 07/24/14   Renato Shin, MD  ?budesonide-formoterol (SYMBICORT) 160-4.5 MCG/ACT inhaler TAKE 2 PUFFS FIRST THING IN AM AND THEN ANOTHER 2 PUFFS ABOUT 12 HOURS LATER. ?Patient taking differently: Inhale 2 puffs into the lungs every 12 (twelve)  hours. 07/13/21   Tanda Rockers, MD  ?cetirizine (ZYRTEC ALLERGY) 10 MG tablet Take 1 tablet (10 mg total) by mouth daily as needed for allergies. 05/04/20   Tanda Rockers, MD  ?diclofenac Sodium (VOLTAREN) 1 % GEL Apply 4 g topically 4 (four) times daily as needed (pain). 01/27/21   [provider]  ?ELIQUIS 2.5 MG TABS tablet TAKE 1 TABLET BY MOUTH TWICE A DAY 11/21/21   Deboraha Sprang, MD  ?furosemide (LASIX) 40 MG tablet TAKE 1 TABLET BY MOUTH EVERY DAY ?Patient taking differently: as needed. 02/04/21   Fay Records, MD  ?glucose blood [facility] VERIO) test strip 1 each by Other route 2 (two) times daily. And lancets 2/day 12/13/21   Renato Shin, MD  ?glucose blood test strip Check blood sugar twice a day 04/11/21   Renato Shin, MD  ?hydrALAZINE (APRESOLINE) 25 MG tablet TAKE 3 TABLETS (75 MG TOTAL) BY MOUTH 3 (THREE) TIMES DAILY. 10/20/21   Fay Records, MD  ?insulin lispro (HUMALOG) 100 UNIT/ML injection 5 UNITS WITH BREAKFAST, AND 18 UNITS WITH SUPPER ?Patient taking differently: Inject 5-22 Units into the skin See admin instructions. 5 units with breakfast and 22 units with dinner 12/12/21   Renato Shin, MD  ?latanoprost (XALATAN) 0.005 % ophthalmic solution Place 1 drop into both eyes at bedtime. 03/16/21   [provider]  ?omeprazole (PRILOSEC) 40 MG capsule Take 1 capsule (40 mg total) by mouth daily. 07/05/21   Tanda Rockers, MD  ?potassium chloride (KLOR-CON) 10 MEQ tablet TAKE 1 TABLET BY MOUTH EVERY DAY ?Patient taking differently: Take 10 mEq by mouth daily. 09/20/21   Deboraha Sprang, MD  ?pravastatin (PRAVACHOL) 40 MG tablet Take 1 tablet (40 mg total) by mouth daily. 08/30/21   [name], [place]  ?Syringe/Needle, Disp, (SYRINGE 3CC/25GX1") 25G X 1" 3 ML MISC Use to inject into the skin 2x a day 04/11/21   Renato Shin, MD  ? ? ?Inpatient Medications: ?Scheduled Meds: ? ?Continuous Infusions: ? heparin 1,000 Units/hr (12/23/21 0730)  ? ?PRN  Meds: ?albuterol ? ?Allergies:    ?Allergies  ?Allergen Reactions  ? Aspirin Shortness Of Breath and Other (See Comments)  ?  Caused asthma symptoms  ? Ramipril Cough  ? ? ?Social History:   ?Social History  ? ?Socioeconomic History  ? Marital status: Widowed  ?  Spouse name: Not on file  ? Number of children: Not on file  ? Years of education: Not on file  ? Highest education level: Not on file  ?Occupational History  ? Occupation: retired  ?Tobacco Use  ? Smoking status: Former  ?  Packs/day: 0.30  ?  Years: 5.00  ?  Pack years: 1.50  ?  Types:  Cigarettes  ?  Quit date: 10/02/1986  ?  Years since quitting: 35.2  ? Smokeless tobacco: Never  ?Vaping Use  ? Vaping Use: Never used  ?Substance and Sexual Activity  ? Alcohol use: No  ? Drug use: No  ? Sexual activity: Not on file  ?Other Topics Concern  ? Not on file  ?Social History Narrative  ? Not on file  ? ?Social Determinants of Health  ? ?Financial Resource Strain: Not on file  ?Food Insecurity: Not on file  ?Transportation Needs: Not on file  ?Physical Activity: Not on file  ?Stress: Not on file  ?Social Connections: Not on file  ?Intimate Partner Violence: Not on file  ?  ?Family History:   ? ?Family History  ?Problem Relation Age of Onset  ? Lung cancer Brother   ? Melanoma Brother   ? Hypertension Mother   ? Stroke Mother   ? Other Father   ?     poor circulation  ? Cancer Sister   ? Diabetes Daughter   ? Atopy Neg Hx   ? Asthma Neg Hx   ? Breast cancer Neg Hx   ?  ? ?ROS:  ?Please see the history of present illness.  ? ?All other ROS reviewed and negative.    ? ?Physical Exam/Data:  ? ?Vitals:  ? 12/23/21 0500 12/23/21 0600 12/23/21 0630 12/23/21 0904  ?BP: (!) 145/71 134/60 128/63 (!) 146/79  ?Pulse: 61 60 (!) 59 60  ?Resp: '20 16 16 ' (!) 22  ?Temp:    97.9 ?F (36.6 ?C)  ?TempSrc:    Oral  ?SpO2: 100% 100% 100% 100%  ?Weight:      ?Height:      ? ? ?Intake/Output Summary (Last 24 hours) at 12/23/2021 1010 ?Last data filed at 12/23/2021 8016 ?Gross per 24 hour   ?Intake 50 ml  ?Output --  ?Net 50 ml  ? ? ?  12/23/2021  ?  4:09 AM 12/12/2021  ?  8:00 AM 11/04/2021  ?  9:08 AM  ?Last 3 Weights  ?Weight (lbs) 181 lb 181 lb 12.8 oz 183 lb 3.2 oz  ?Weight (kg) 82.101 kg 82.464 kg 83.099 kg  ?   ?B

## 2021-12-23 NOTE — Progress Notes (Signed)
ANTICOAGULATION CONSULT NOTE - Initial Consult ? ?Pharmacy Consult for Heparin ?Indication: chest pain/ACS ? ?Allergies  ?Allergen Reactions  ? Aspirin Shortness Of Breath and Other (See Comments)  ?  Caused asthma symptoms  ? Ramipril Cough  ? ? ?Patient Measurements: ?Height: '5\' 7"'  (170.2 cm) ?Weight: 82.1 kg (181 lb) ?IBW/kg (Calculated) : 61.6 ?Heparin Dosing Weight: 75 kg ? ?Vital Signs: ?Temp: 97.9 ?F (36.6 ?C) (03/24 8185) ?BP: 128/63 (03/24 0630) ?Pulse Rate: 59 (03/24 0630) ? ?Labs: ?Recent Labs  ?  12/23/21 ?0426 12/23/21 ?0617  ?HGB 12.0  --   ?HCT 35.5*  --   ?PLT 184  --   ?CREATININE 1.87*  --   ?TROPONINIHS 187* 346*  ? ? ?Estimated Creatinine Clearance: 25.1 mL/min (A) (by C-G formula based on SCr of 1.87 mg/dL (H)). ? ? ?Medical History: ?Past Medical History:  ?Diagnosis Date  ? Allergic rhinitis   ? Anterior chest wall pain   ? Anxiety   ? Asthma   ? Chronic diastolic CHF (congestive heart failure) ([place])   ? Echo 01/2020: EF 60-65, normal wall motion, mild LVH, normal RV SF, RVSP 52.6 (moderate elevation), severe LAE, moderate RAE, trivial MR, mild MS (mean gradient 5.5 mmHg), mild aortic valve sclerosis (no AS); elevated E/e' c/w elevated LVEDP  ? Cough   ? DM type 2 (diabetes mellitus, type 2) ([place])   ? GERD (gastroesophageal reflux disease)   ? History of diverticulitis of colon   ? HTN (hypertension)   ? Hyperlipidemia   ? IBS (irritable bowel syndrome)   ? Iron deficiency anemia   ? Osteoporosis, unspecified   ? Renal insufficiency 07/31/2017  ? UTI (urinary tract infection)   ? ? ?Medications:  ?No current facility-administered medications on file prior to encounter.  ? ?Current Outpatient Medications on File Prior to Encounter  ?Medication Sig Dispense Refill  ? montelukast (SINGULAIR) 10 MG tablet TAKE 1 TABLET BY MOUTH EVERYDAY AT BEDTIME 90 tablet 3  ? acetaminophen (TYLENOL) 325 MG tablet Take 650 mg by mouth every 6 (six) hours as needed for pain.    ? albuterol (PROAIR HFA) 108 (90  Base) MCG/ACT inhaler Inhale 2 puffs into the lungs every 4 (four) hours as needed for wheezing. 18 g 11  ? amLODipine (NORVASC) 5 MG tablet TAKE 1 TABLET BY MOUTH TWICE A DAY 180 tablet 3  ? azithromycin (ZITHROMAX) 250 MG tablet Take 2 on day one then 1 daily x 4 days 6 tablet 0  ? BD INSULIN SYRINGE U/F 31G X 5/16" 1 ML MISC Inject into the skin 2 (two) times daily.    ? Blood Glucose Monitoring Suppl (ONE TOUCH ULTRA 2) W/DEVICE KIT Check blood sugar 3 times per day. 1 each 0  ? budesonide-formoterol (SYMBICORT) 160-4.5 MCG/ACT inhaler TAKE 2 PUFFS FIRST THING IN AM AND THEN ANOTHER 2 PUFFS ABOUT 12 HOURS LATER. 30.6 each 5  ? cetirizine (ZYRTEC ALLERGY) 10 MG tablet Take 1 tablet (10 mg total) by mouth daily as needed for allergies.    ? diclofenac Sodium (VOLTAREN) 1 % GEL as needed.    ? ELIQUIS 2.5 MG TABS tablet TAKE 1 TABLET BY MOUTH TWICE A DAY 60 tablet 6  ? furosemide (LASIX) 40 MG tablet TAKE 1 TABLET BY MOUTH EVERY DAY (Patient taking differently: as needed.) 90 tablet 1  ? glucose blood (ONETOUCH VERIO) test strip 1 each by Other route 2 (two) times daily. And lancets 2/day 200 each 3  ? glucose blood test strip  Check blood sugar twice a day 100 each 12  ? hydrALAZINE (APRESOLINE) 25 MG tablet TAKE 3 TABLETS (75 MG TOTAL) BY MOUTH 3 (THREE) TIMES DAILY. 810 tablet 2  ? insulin lispro (HUMALOG) 100 UNIT/ML injection 5 UNITS WITH BREAKFAST, AND 18 UNITS WITH SUPPER 60 mL 1  ? latanoprost (XALATAN) 0.005 % ophthalmic solution Place 1 drop into both eyes nightly.    ? omeprazole (PRILOSEC) 40 MG capsule Take 1 capsule (40 mg total) by mouth daily. 30 capsule 11  ? potassium chloride (KLOR-CON) 10 MEQ tablet TAKE 1 TABLET BY MOUTH EVERY DAY 90 tablet 2  ? pravastatin (PRAVACHOL) 40 MG tablet Take 1 tablet (40 mg total) by mouth daily. 90 tablet 1  ? STUDY - ASPIRE - apixaban 2.5 mg or placebo tablet (PI-Sethi)     ? Syringe/Needle, Disp, (SYRINGE 3CC/25GX1") 25G X 1" 3 ML MISC Use to inject into the skin  2x a day 100 each 3  ?  ? ?Assessment: ?86 y.o. female with chest pain for heparin  ?Goal of Therapy:  ?Heparin level 0.3-0.7 units/ml ?Monitor platelets by anticoagulation protocol: Yes ?  ?Plan:  ?Heparin 4000 units IV bolus, then start heparin 1000 units/hr ?Check heparin level in 8 hours.  ? ?Makiya Jeune, [place] ?12/23/2021,7:17 AM ? ? ?

## 2021-12-23 NOTE — ED Notes (Signed)
Lab notified of add on blood work  ?

## 2021-12-23 NOTE — Progress Notes (Signed)
Cardiology/Cath Lab Note: ? ?Pt arrived in the cath lab and was dyspneic. O2 sats are 90% on 4L. She is unable to lay flat without worsened dyspnea. Suspect pulmonary edema. Will given IV Lasix now and transfer back to her room. Will delay cath while diuresing and following renal function. Plan discussed with the cardiology team and her daughter.  ? ?[name] ?12/23/2021 ?12:56 PM ? ?

## 2021-12-23 NOTE — ED Triage Notes (Signed)
Pt says it feels like "something is sitting on my chest". Pain worse when she lies down.  ?

## 2021-12-24 ENCOUNTER — Inpatient Hospital Stay (HOSPITAL_COMMUNITY): Payer: Medicare Other

## 2021-12-24 DIAGNOSIS — I200 Unstable angina: Secondary | ICD-10-CM | POA: Diagnosis not present

## 2021-12-24 DIAGNOSIS — I4891 Unspecified atrial fibrillation: Secondary | ICD-10-CM | POA: Diagnosis not present

## 2021-12-24 DIAGNOSIS — I214 Non-ST elevation (NSTEMI) myocardial infarction: Secondary | ICD-10-CM | POA: Diagnosis not present

## 2021-12-24 DIAGNOSIS — E785 Hyperlipidemia, unspecified: Secondary | ICD-10-CM | POA: Diagnosis not present

## 2021-12-24 LAB — BASIC METABOLIC PANEL
Anion gap: 9 (ref 5–15)
Anion gap: 9 (ref 5–15)
BUN: 22 mg/dL (ref 8–23)
BUN: 34 mg/dL — ABNORMAL HIGH (ref 8–23)
CO2: 18 mmol/L — ABNORMAL LOW (ref 22–32)
CO2: 18 mmol/L — ABNORMAL LOW (ref 22–32)
Calcium: 9.2 mg/dL (ref 8.9–10.3)
Calcium: 8.9 mg/dL (ref 8.9–10.3)
Chloride: 110 mmol/L (ref 98–111)
Chloride: 111 mmol/L (ref 98–111)
Creatinine, Ser: 2.45 mg/dL — ABNORMAL HIGH (ref 0.44–1.00)
Creatinine, Ser: 3.11 mg/dL — ABNORMAL HIGH (ref 0.44–1.00)
GFR, Estimated: 19 mL/min — ABNORMAL LOW (ref >60)
GFR, Estimated: 14 mL/min — ABNORMAL LOW (ref >60)
Glucose, Bld: 186 mg/dL — ABNORMAL HIGH (ref 70–99)
Glucose, Bld: 146 mg/dL — ABNORMAL HIGH (ref 70–99)
Potassium: 5.1 mmol/L (ref 3.5–5.1)
Potassium: 4.9 mmol/L (ref 3.5–5.1)
Sodium: 137 mmol/L (ref 135–145)
Sodium: 138 mmol/L (ref 135–145)

## 2021-12-24 LAB — CBC
HCT: 34.9 % — ABNORMAL LOW (ref 36.0–46.0)
Hemoglobin: 12 g/dL (ref 12.0–15.0)
MCH: 27.2 pg (ref 26.0–34.0)
MCHC: 34.4 g/dL (ref 30.0–36.0)
MCV: 79.1 fL — ABNORMAL LOW (ref 80.0–100.0)
Platelets: 150 10*3/uL (ref 150–400)
RBC: 4.41 MIL/uL (ref 3.87–5.11)
RDW: 15.8 % — ABNORMAL HIGH (ref 11.5–15.5)
WBC: 11.7 10*3/uL — ABNORMAL HIGH (ref 4.0–10.5)
nRBC: 0.4 % — ABNORMAL HIGH (ref 0.0–0.2)

## 2021-12-24 LAB — GLUCOSE, CAPILLARY
Glucose-Capillary: 159 mg/dL — ABNORMAL HIGH (ref 70–99)
Glucose-Capillary: 176 mg/dL — ABNORMAL HIGH (ref 70–99)
Glucose-Capillary: 145 mg/dL — ABNORMAL HIGH (ref 70–99)
Glucose-Capillary: 171 mg/dL — ABNORMAL HIGH (ref 70–99)
Glucose-Capillary: 140 mg/dL — ABNORMAL HIGH (ref 70–99)
Glucose-Capillary: 163 mg/dL — ABNORMAL HIGH (ref 70–99)

## 2021-12-24 LAB — HEPARIN LEVEL (UNFRACTIONATED): Heparin Unfractionated: 0.96 IU/mL — ABNORMAL HIGH (ref 0.30–0.70)

## 2021-12-24 LAB — APTT: aPTT: 82 seconds — ABNORMAL HIGH (ref 24–36)

## 2021-12-24 LAB — LACTIC ACID, PLASMA: Lactic Acid, Venous: 2.3 mmol/L (ref 0.5–1.9)

## 2021-12-24 MED ORDER — AZITHROMYCIN 500 MG PO TABS
500.0000 mg | ORAL_TABLET | Freq: Every day | ORAL | Status: DC
  Administered 2021-12-24 – 2021-12-30 (×7): 500 mg via ORAL
  Filled 2021-12-24 (×7): qty 1

## 2021-12-24 MED ORDER — SODIUM CHLORIDE 0.9 % IV SOLN
1.0000 g | INTRAVENOUS | Status: DC
  Administered 2021-12-24: 1 g via INTRAVENOUS
  Filled 2021-12-24: qty 10

## 2021-12-24 MED ORDER — METOCLOPRAMIDE HCL 5 MG/ML IJ SOLN
10.0000 mg | Freq: Four times a day (QID) | INTRAMUSCULAR | Status: DC | PRN
  Administered 2021-12-24 – 2021-12-27 (×4): 10 mg via INTRAVENOUS
  Filled 2021-12-24 (×4): qty 2

## 2021-12-24 MED ORDER — WHITE PETROLATUM EX OINT
TOPICAL_OINTMENT | CUTANEOUS | Status: DC | PRN
  Administered 2021-12-24: 0.2 via TOPICAL
  Filled 2021-12-24: qty 28.35

## 2021-12-24 NOTE — Consult Note (Signed)
?History and Physical  ? ? ?[name]:[id number] DOB: 08-02-1936 DOA: 12/23/2021 ? ?PCP: [name], [place] (Confirm with patient/family/NH records and if not entered, this has to be entered at [facility] point of entry) ?Patient coming from: Home ? ?I have personally briefly reviewed patient's old medical records in [place] ? ?Chief Complaint: Feeling nausea, dry heaves ? ?HPI: [name] is a 86 y.o. female with medical history significant of chronic systolic CHF LVEF 09-32%, PAF on Eliquis, IDDM, HTN, HLD, CKD stage IIIb, GERD, COPD Gold stage II, Heart block on PPM, who was admitted under cardiology service for continuous onset of chest pain since yesterday, complained about persistent feeling nausea and frequent dry heaves for 3 days. ? ?Symptoms started on Wednesday, patient started to feel frequent nauseous, no vomiting.  She also complained about episodes of epigastric cramping-like pain, nonradiating.  No diarrhea.  She had a normal bowel movement yesterday.  She has been able to tolerate her meals, but her symptoms became worse today, she has not been able to eat any solid food since this morning.  Patient also started to have episode of subjective fever and chills overnight.  She was given Zofran x4 yesterday and x1 today with minimum improvement. ? ?Yesterday morning, patient woke up with severe chest pain and came to the hospital was found troponins in the 300s.  Cardiology was planning for cardiac cath however given patient's significant GI symptoms, procedure was canceled.  And patient has been on nitroglycerin drip and heparin drip since yesterday. ? ?She has no history of diabetic neuropathy or diabetic gastroparesis.  Her last EGD was done more than 13 years ago, which showed gastritis and Candida esophagitis. ? ?Review her labs, creatinine 1.8, bicarb 16, K4.0, glucose 191 ? ?Review of Systems: As per HPI otherwise 14 point review of systems negative.  ? ? ?Past Medical History:   ?Diagnosis Date  ? Allergic rhinitis   ? Anterior chest wall pain   ? Anxiety   ? Asthma   ? Chronic diastolic CHF (congestive heart failure) ([place])   ? Echo 01/2020: EF 60-65, normal wall motion, mild LVH, normal RV SF, RVSP 52.6 (moderate elevation), severe LAE, moderate RAE, trivial MR, mild MS (mean gradient 5.5 mmHg), mild aortic valve sclerosis (no AS); elevated E/e' c/w elevated LVEDP  ? Cough   ? DM type 2 (diabetes mellitus, type 2) ([place])   ? GERD (gastroesophageal reflux disease)   ? History of diverticulitis of colon   ? HTN (hypertension)   ? Hyperlipidemia   ? IBS (irritable bowel syndrome)   ? Iron deficiency anemia   ? Osteoporosis, unspecified   ? Renal insufficiency 07/31/2017  ? UTI (urinary tract infection)   ? ? ?Past Surgical History:  ?Procedure Laterality Date  ? CARDIOVASCULAR STRESS TEST  02/25/04  ? ESOPHAGOGASTRODUODENOSCOPY  12/26/01  ? PACEMAKER IMPLANT N/A 08/02/2020  ? Procedure: PACEMAKER IMPLANT;  Surgeon: Deboraha Sprang, MD;  Location: [facility] [place] [facility];  Service: Cardiovascular;  Laterality: N/A;  ? RIGHT OOPHORECTOMY  jan 2010  ? ? ? reports that she quit smoking about 35 years ago. Her smoking use included cigarettes. She has a 1.50 pack-year smoking history. She has never used smokeless tobacco. She reports that she does not drink alcohol and does not use drugs. ? ?Allergies  ?Allergen Reactions  ? Aspirin Shortness Of Breath and Other (See Comments)  ?  Caused asthma symptoms  ? Ramipril Cough  ? ? ?Family  History  ?Problem Relation Age of Onset  ? Lung cancer Brother   ? Melanoma Brother   ? Hypertension Mother   ? Stroke Mother   ? Other Father   ?     poor circulation  ? Cancer Sister   ? Diabetes Daughter   ? Atopy Neg Hx   ? Asthma Neg Hx   ? Breast cancer Neg Hx   ? ? ? ?Prior to Admission medications   ?Medication Sig Start Date End Date Taking? Authorizing Provider  ?acetaminophen (TYLENOL) 325 MG tablet Take 650 mg by mouth every 6 (six) hours as needed for pain.   Yes  [provider]  ?albuterol (PROAIR HFA) 108 (90 Base) MCG/ACT inhaler Inhale 2 puffs into the lungs every 4 (four) hours as needed for wheezing. 05/05/19 02/04/22 Yes Tanda Rockers, MD  ?amLODipine (NORVASC) 5 MG tablet TAKE 1 TABLET BY MOUTH TWICE A DAY ?Patient taking differently: Take 5 mg by mouth in the morning and at bedtime. 09/27/21  Yes Fay Records, MD  ?Ascorbic Acid (VITAMIN C) 1000 MG tablet Take 1,000 mg by mouth daily.   Yes [provider]  ?budesonide-formoterol (SYMBICORT) 160-4.5 MCG/ACT inhaler TAKE 2 PUFFS FIRST THING IN AM AND THEN ANOTHER 2 PUFFS ABOUT 12 HOURS LATER. ?Patient taking differently: Inhale 2 puffs into the lungs every 12 (twelve) hours. 07/13/21  Yes Tanda Rockers, MD  ?cetirizine (ZYRTEC ALLERGY) 10 MG tablet Take 1 tablet (10 mg total) by mouth daily as needed for allergies. 05/04/20  Yes Tanda Rockers, MD  ?diclofenac Sodium (VOLTAREN) 1 % GEL Apply 4 g topically 4 (four) times daily as needed (pain). 01/27/21  Yes [provider]  ?ELIQUIS 2.5 MG TABS tablet TAKE 1 TABLET BY MOUTH TWICE A DAY ?Patient taking differently: Take 2.5 mg by mouth 2 (two) times daily. 11/21/21  Yes Deboraha Sprang, MD  ?furosemide (LASIX) 40 MG tablet TAKE 1 TABLET BY MOUTH EVERY DAY ?Patient taking differently: Take 40 mg by mouth daily as needed for fluid. 02/04/21  Yes Fay Records, MD  ?hydrALAZINE (APRESOLINE) 25 MG tablet TAKE 3 TABLETS (75 MG TOTAL) BY MOUTH 3 (THREE) TIMES DAILY. 10/20/21  Yes Fay Records, MD  ?insulin lispro (HUMALOG) 100 UNIT/ML injection 5 UNITS WITH BREAKFAST, AND 18 UNITS WITH SUPPER ?Patient taking differently: Inject 5-20 Units into the skin See admin instructions. 5 units with breakfast and 20 units with dinner 12/12/21  Yes Renato Shin, MD  ?latanoprost (XALATAN) 0.005 % ophthalmic solution Place 1 drop into both eyes at bedtime. 03/16/21  Yes [provider]  ?montelukast (SINGULAIR) 10 MG tablet TAKE 1 TABLET BY MOUTH EVERYDAY  AT BEDTIME ?Patient taking differently: Take 10 mg by mouth at bedtime. 12/22/21  Yes Tanda Rockers, MD  ?omeprazole (PRILOSEC) 40 MG capsule Take 1 capsule (40 mg total) by mouth daily. 07/05/21  Yes Tanda Rockers, MD  ?potassium chloride (KLOR-CON) 10 MEQ tablet TAKE 1 TABLET BY MOUTH EVERY DAY ?Patient taking differently: Take 10 mEq by mouth daily. 09/20/21  Yes Deboraha Sprang, MD  ?pravastatin (PRAVACHOL) 40 MG tablet Take 1 tablet (40 mg total) by mouth daily. 08/30/21  Yes [name], FNP  ?traMADol (ULTRAM) 50 MG tablet Take 50 mg by mouth every 6 (six) hours as needed for moderate pain. 10/12/21  Yes [provider]  ?azithromycin (ZITHROMAX) 250 MG tablet Take 2 on day one then 1 daily x 4 days ?Patient not taking:  Reported on 12/23/2021 07/04/21   Tanda Rockers, MD  ?Blood Glucose Monitoring Suppl (ONE TOUCH ULTRA 2) W/DEVICE KIT Check blood sugar 3 times per day. 07/24/14   Renato Shin, MD  ?glucose blood [facility] VERIO) test strip 1 each by Other route 2 (two) times daily. And lancets 2/day 12/13/21   Renato Shin, MD  ?glucose blood test strip Check blood sugar twice a day 04/11/21   Renato Shin, MD  ?Syringe/Needle, Disp, (SYRINGE 3CC/25GX1") 25G X 1" 3 ML MISC Use to inject into the skin 2x a day 04/11/21   Renato Shin, MD  ? ? ?Physical Exam: ?Vitals:  ? 12/24/21 0757 12/24/21 0842 12/24/21 0848 12/24/21 1140  ?BP: 106/62   (!) 120/59  ?Pulse: 61   60  ?Resp: (!) 24   (!) 32  ?Temp: 97.9 ?F (36.6 ?C)   97.8 ?F (36.6 ?C)  ?TempSrc: Oral     ?SpO2: 98% 100% 100% 100%  ?Weight:      ?Height:      ? ? ?Constitutional: NAD, calm, comfortable ?Vitals:  ? 12/24/21 0757 12/24/21 0842 12/24/21 0848 12/24/21 1140  ?BP: 106/62   (!) 120/59  ?Pulse: 61   60  ?Resp: (!) 24   (!) 32  ?Temp: 97.9 ?F (36.6 ?C)   97.8 ?F (36.6 ?C)  ?TempSrc: Oral     ?SpO2: 98% 100% 100% 100%  ?Weight:      ?Height:      ? ?Eyes: PERRL, lids and conjunctivae normal ?ENMT: Mucous membranes are moist.  Posterior pharynx clear of any exudate or lesions.Normal dentition.  ?Neck: normal, supple, no masses, no thyromegaly ?Respiratory: clear to auscultation bilaterally, no wheezing, fine crackles on bilateral ba

## 2021-12-24 NOTE — Progress Notes (Signed)
ANTICOAGULATION CONSULT NOTE ? ?Pharmacy Consult for Heparin ?Indication: chest pain/ACS ? ?Allergies  ?Allergen Reactions  ? Aspirin Shortness Of Breath and Other (See Comments)  ?  Caused asthma symptoms  ? Ramipril Cough  ? ? ?Patient Measurements: ?Height: 5\' 7"  (170.2 cm) ?Weight: 82.1 kg (181 lb) ?IBW/kg (Calculated) : 61.6 ?Heparin Dosing Weight: 75 kg ? ?Vital Signs: ?Temp: 97.8 ?F (36.6 ?C) (03/25 1140) ?Temp Source: Oral (03/25 0757) ?BP: 120/59 (03/25 1140) ?Pulse Rate: 60 (03/25 1140) ? ?Labs: ?Recent Labs  ?  12/23/21 ?0426 12/23/21 ?0617 12/23/21 ?1615 12/24/21 ?0018  ?HGB 12.0  --   --  12.0  ?HCT 35.5*  --   --  34.9*  ?PLT 184  --   --  150  ?APTT  --   --  83* 82*  ?HEPARINUNFRC  --   --  1.09* 0.96*  ?CREATININE 1.87*  --   --  2.45*  ?TROPONINIHS 187* 346*  --   --   ? ? ? ?Estimated Creatinine Clearance: 19.2 mL/min (A) (by C-G formula based on SCr of 2.45 mg/dL (H)). ? ?  ? ?Assessment: ?86 y.o. female with chest pain to continue on IV heparin.  Patient was on Eliquis PTA for history of Afib, so will use aPTT to guide heparin dosing until aPTT and heparin level correlate.  Last dose of Eliquis was on 3/23 at 1200. ? ?aPTT remains therapeutic at 83 sec; heparin level elevated as expected from recent Eliquis.  No issues reported.  CBC stable. ? ?Goal of Therapy:  ?Heparin level 0.3-0.7 units/ml ?Monitor platelets by anticoagulation protocol: Yes ?  ?Plan:  ?Continue heparin infusion at 1000 units/hr ?Daily aPTT, heparin level and CBC. ? ?[place] Crane, Pharm D, BCPS, BCCP ?Clinical Pharmacist ? 12/24/2021 1:59 PM  ? ?[facility] pharmacy phone numbers are listed on [email or web address] ? ? ?

## 2021-12-24 NOTE — Progress Notes (Signed)
? ?Progress Note ? ?Patient Name: [name] ?Date of Encounter: 12/24/2021 ? ?Primary Cardiologist: Dorris Carnes, MD  ? ? ? ?Patient Profile  ?   ?86 y.o. female with a history of complete heart block status post pacemaker, diabetes, permanent atrial fibrillation, chronic renal insufficiency and asthma admitted with chest pain; peak troponin 346 ?Echo 4/21 demonstrated normal LV function, moderate pulmonary hypertension, severe left atrial enlargement and mild mitral stenosis ?Echo 3/23 EF 30-35%  RVE  MS mean grad 6.5  ? ?Catheterization was deferred because of suspected acute pulmonary edema ? ?Subjective  ? ?Nauseated and dry heaving, which has been present intermittently since prior to arrival ; still short of breath. ?No further chest pain ? ?Inpatient Medications  ?  ?Scheduled Meds: ? amLODipine  5 mg Oral BID  ? aspirin EC  81 mg Oral Daily  ? atorvastatin  80 mg Oral Daily  ? fluticasone furoate-vilanterol  1 puff Inhalation Daily  ? furosemide  40 mg Intravenous BID  ? hydrALAZINE  75 mg Oral TID  ? insulin aspart  0-5 Units Subcutaneous QHS  ? insulin aspart  0-9 Units Subcutaneous TID WC  ? latanoprost  1 drop Both Eyes QHS  ? metoprolol tartrate  12.5 mg Oral BID  ? montelukast  10 mg Oral QHS  ? pantoprazole (PROTONIX) IV  40 mg Intravenous Q24H  ? sodium chloride flush  3 mL Intravenous Q12H  ? ?Continuous Infusions: ? sodium chloride 10 mL/hr at 12/23/21 1053  ? heparin 1,000 Units/hr (12/24/21 0451)  ? nitroGLYCERIN 5 mcg/min (12/23/21 1214)  ? ?PRN Meds: ?acetaminophen, albuterol, albuterol, nitroGLYCERIN, ondansetron (ZOFRAN) IV, white petrolatum  ? ?Vital Signs  ?  ?Vitals:  ? 12/24/21 0757 12/24/21 0842 12/24/21 0848 12/24/21 1140  ?BP: 106/62   (!) 120/59  ?Pulse: 61   60  ?Resp: (!) 24   (!) 32  ?Temp: 97.9 ?F (36.6 ?C)   97.8 ?F (36.6 ?C)  ?TempSrc: Oral     ?SpO2: 98% 100% 100% 100%  ?Weight:      ?Height:      ? ? ?Intake/Output Summary (Last 24 hours) at 12/24/2021 1151 ?Last data filed at  12/24/2021 1018 ?Gross per 24 hour  ?Intake 798.31 ml  ?Output 850 ml  ?Net -51.69 ml  ? ?Filed Weights  ? 12/23/21 0409 12/23/21 1025  ?Weight: 82.1 kg 82.1 kg  ? ? ?Telemetry  ?  ?Ventricular pacing- Personally Reviewed ? ?ECG  ?  ?Atrial fibrillation with underlying ventricular pacing- Personally Reviewed ? ?Physical Exam  ?  ?GEN: Uncomfortable ?Neck: Unable to appreciate neck veins ?Cardiac: RRR, no murmurs, rubs, or gallops.  ?Respiratory: Bilateral crackles ?GI: Soft, nontender, non-distended  ?MS: No edema; No deformity. ?Neuro:  Nonfocal  ?Psych: Normal affect  ?Cap refill normal and skin warm  ? ?Labs  ?  ?Chemistry ?Recent Labs  ?Lab 12/23/21 ?0426 12/24/21 ?0018  ?NA 139 137  ?K 4.0 5.1  ?CL 108 110  ?CO2 16* 18*  ?GLUCOSE 191* 186*  ?BUN 21 22  ?CREATININE 1.87* 2.45*  ?CALCIUM 9.9 9.2  ?PROT 7.7  --   ?ALBUMIN 4.2  --   ?AST 15  --   ?ALT 9  --   ?ALKPHOS 68  --   ?BILITOT 0.7  --   ?GFRNONAA 26* 19*  ?ANIONGAP 15 9  ?  ? ?Hematology ?Recent Labs  ?Lab 12/23/21 ?0426 12/24/21 ?0018  ?WBC 7.0 11.7*  ?RBC 4.59 4.41  ?HGB 12.0 12.0  ?HCT  35.5* 34.9*  ?MCV 77.3* 79.1*  ?MCH 26.1 27.2  ?MCHC 33.8 34.4  ?RDW 16.2* 15.8*  ?PLT 184 150  ? ? ?Cardiac Enzymes troponin high-sensitivity 346 ? ?BNP ?Recent Labs  ?Lab 12/23/21 ?1030  ?BNP 230.4*  ?  ? ?DDimer No results for input(s): DDIMER in the last 168 hours.  ? ?Radiology  ?  ?DG Chest 2 View ? ?Result Date: 12/23/2021 ?CLINICAL DATA:  Chest pain. EXAM: CHEST - 2 VIEW COMPARISON:  PA Lat 08/03/2020. FINDINGS: The heart is moderately enlarged. There is increased vascular engorgement, interstitial edema in the mid to lower lung fields and patchy hazy opacities throughout the lung fields which could be ground-glass edema or pneumonitis. Small pleural effusions are also beginning to form. A single lead left chest cardiac assist device and wire insertion are unaltered. There is a stable mediastinum with aortic atherosclerosis. There is dextroscoliosis, degenerative  change of the spine and osteopenia. Thoracic cage appears intact. IMPRESSION: 1. CHF with interstitial edema and small pleural effusions. 2. Patchy haziness throughout the lungs which could be ground-glass edema or pneumonitis. Electronically Signed   By: Telford Nab M.D.   On: 12/23/2021 05:16  ? ?ECHOCARDIOGRAM COMPLETE ? ?Result Date: 12/23/2021 ?   ECHOCARDIOGRAM REPORT   Patient Name:   SRINIDHI LANDERS Date of Exam: 12/23/2021 Medical Rec #:  [id number]   Height:       67.0 in Accession #:    [id number]  Weight:       181.0 lb Date of Birth:  03/14/1936    BSA:          1.938 m? Patient Age:    75 years    BP:           134/60 mmHg Patient Gender: F           HR:           62 bpm. Exam Location:  Inpatient Procedure: 2D Echo, Color Doppler, Cardiac Doppler and Intracardiac            Opacification Agent Indications:    Chest pain  History:        Patient has prior history of Echocardiogram examinations. CAD,                 Arrythmias:Atrial Fibrillation; Risk Factors:Hypertension,                 Dyslipidemia and Diabetes.  Sonographer:    Jyl Heinz Referring Phys: [place]  1. Left ventricular ejection fraction, by estimation, is 30 to 35%. The left ventricle has moderately decreased function. The left ventricle demonstrates regional wall motion abnormalities (see scoring diagram/findings for description). There is mild concentric left ventricular hypertrophy. Indeterminate diastolic filling due to E-A fusion. Elevated left ventricular end-diastolic pressure.  2. Right ventricular systolic function is mildly reduced. The right ventricular size is moderately enlarged. There is moderately elevated pulmonary artery systolic pressure.  3. Left atrial size was severely dilated.  4. Right atrial size was moderately dilated.  5. The mitral valve is abnormal. Trivial mitral valve regurgitation. Moderate mitral stenosis. The mean mitral valve gradient is 6.5 mmHg.  6. Tricuspid valve regurgitation  is moderate.  7. The aortic valve is tricuspid. There is mild calcification of the aortic valve. There is mild thickening of the aortic valve. Aortic valve regurgitation is trivial. Aortic valve sclerosis is present, with no evidence of aortic valve stenosis.  8. The inferior vena cava is dilated in  size with >50% respiratory variability, suggesting right atrial pressure of 8 mmHg. Comparison(s): Prior images reviewed side by side. Changes from prior study are noted. Conclusion(s)/Recommendation(s): Reduced LVEF with new wall motion abnormalities. No LV thrombus. Elevated LVEDP. Findings communicated to Dr. Acie Fredrickson. FINDINGS  Left Ventricle: Left ventricular ejection fraction, by estimation, is 30 to 35%. The left ventricle has moderately decreased function. The left ventricle demonstrates regional wall motion abnormalities. Definity contrast agent was given IV to delineate the left ventricular endocardial borders. The left ventricular internal cavity size was normal in size. There is mild concentric left ventricular hypertrophy. Indeterminate diastolic filling due to E-A fusion. Elevated left ventricular end-diastolic pressure. The E/e' is 42.6 and 33.7.  LV Wall Scoring: The mid and distal anterior septum, mid inferoseptal segment, apical anterior segment, apical inferior segment, and apex are akinetic. The anterior wall, mid and distal lateral wall, basal anteroseptal segment, mid anterolateral segment, and mid inferior segment are hypokinetic. The basal inferolateral segment, basal anterolateral segment, basal inferior segment, and basal inferoseptal segment are normal. Right Ventricle: The right ventricular size is moderately enlarged. Right vetricular wall thickness was not well visualized. Right ventricular systolic function is mildly reduced. There is moderately elevated pulmonary artery systolic pressure. The tricuspid regurgitant velocity is 3.50 m/s, and with an assumed right atrial pressure of 8 mmHg, the  estimated right ventricular systolic pressure is 33.3 mmHg. Left Atrium: Left atrial size was severely dilated. Right Atrium: Right atrial size was moderately dilated. Pericardium: Trivial pericardial e

## 2021-12-24 NOTE — Progress Notes (Signed)
CT abd reviewed, new right lower lobe infiltrates concerning for PNA.  Discussed with patient sister and niece, went through the image and result of the CT scan. Agreed with starting ABX.  ? ?Gallstone less likely causing her GI symptoms, but will monitor while treating CHF and PNA. ? ?All questions answered with my best knowledge. ?

## 2021-12-25 ENCOUNTER — Inpatient Hospital Stay (HOSPITAL_COMMUNITY): Payer: Medicare Other

## 2021-12-25 DIAGNOSIS — E785 Hyperlipidemia, unspecified: Secondary | ICD-10-CM | POA: Diagnosis not present

## 2021-12-25 DIAGNOSIS — I214 Non-ST elevation (NSTEMI) myocardial infarction: Secondary | ICD-10-CM | POA: Diagnosis not present

## 2021-12-25 DIAGNOSIS — I200 Unstable angina: Secondary | ICD-10-CM | POA: Diagnosis not present

## 2021-12-25 DIAGNOSIS — I4891 Unspecified atrial fibrillation: Secondary | ICD-10-CM | POA: Diagnosis not present

## 2021-12-25 LAB — LIPOPROTEIN A (LPA): Lipoprotein (a): 86.1 nmol/L — ABNORMAL HIGH (ref <75.0)

## 2021-12-25 LAB — APTT: aPTT: 72 seconds — ABNORMAL HIGH (ref 24–36)

## 2021-12-25 LAB — GLUCOSE, CAPILLARY
Glucose-Capillary: 150 mg/dL — ABNORMAL HIGH (ref 70–99)
Glucose-Capillary: 145 mg/dL — ABNORMAL HIGH (ref 70–99)
Glucose-Capillary: 159 mg/dL — ABNORMAL HIGH (ref 70–99)
Glucose-Capillary: 180 mg/dL — ABNORMAL HIGH (ref 70–99)

## 2021-12-25 LAB — COMPREHENSIVE METABOLIC PANEL
ALT: 19 U/L (ref 0–44)
AST: 48 U/L — ABNORMAL HIGH (ref 15–41)
Albumin: 3.2 g/dL — ABNORMAL LOW (ref 3.5–5.0)
Alkaline Phosphatase: 61 U/L (ref 38–126)
Anion gap: 9 (ref 5–15)
BUN: 41 mg/dL — ABNORMAL HIGH (ref 8–23)
CO2: 19 mmol/L — ABNORMAL LOW (ref 22–32)
Calcium: 8.9 mg/dL (ref 8.9–10.3)
Chloride: 108 mmol/L (ref 98–111)
Creatinine, Ser: 3.46 mg/dL — ABNORMAL HIGH (ref 0.44–1.00)
GFR, Estimated: 13 mL/min — ABNORMAL LOW (ref >60)
Glucose, Bld: 147 mg/dL — ABNORMAL HIGH (ref 70–99)
Potassium: 4.9 mmol/L (ref 3.5–5.1)
Sodium: 136 mmol/L (ref 135–145)
Total Bilirubin: 0.9 mg/dL (ref 0.3–1.2)
Total Protein: 6.4 g/dL — ABNORMAL LOW (ref 6.5–8.1)

## 2021-12-25 LAB — LIPASE, BLOOD: Lipase: 31 U/L (ref 11–51)

## 2021-12-25 LAB — CBC
HCT: 32.1 % — ABNORMAL LOW (ref 36.0–46.0)
Hemoglobin: 10.8 g/dL — ABNORMAL LOW (ref 12.0–15.0)
MCH: 27.1 pg (ref 26.0–34.0)
MCHC: 33.6 g/dL (ref 30.0–36.0)
MCV: 80.5 fL (ref 80.0–100.0)
Platelets: 136 10*3/uL — ABNORMAL LOW (ref 150–400)
RBC: 3.99 MIL/uL (ref 3.87–5.11)
RDW: 15.6 % — ABNORMAL HIGH (ref 11.5–15.5)
WBC: 12.6 10*3/uL — ABNORMAL HIGH (ref 4.0–10.5)
nRBC: 0.5 % — ABNORMAL HIGH (ref 0.0–0.2)

## 2021-12-25 LAB — MAGNESIUM: Magnesium: 1.8 mg/dL (ref 1.7–2.4)

## 2021-12-25 LAB — PROCALCITONIN: Procalcitonin: 0.63 ng/mL

## 2021-12-25 LAB — HEPARIN LEVEL (UNFRACTIONATED): Heparin Unfractionated: 0.8 IU/mL — ABNORMAL HIGH (ref 0.30–0.70)

## 2021-12-25 MED ORDER — AMLODIPINE BESYLATE 5 MG PO TABS
5.0000 mg | ORAL_TABLET | Freq: Every day | ORAL | Status: DC
Start: 2021-12-26 — End: 2021-12-29
  Administered 2021-12-26 – 2021-12-28 (×3): 5 mg via ORAL
  Filled 2021-12-25 (×3): qty 1

## 2021-12-25 MED ORDER — HYDRALAZINE HCL 25 MG PO TABS
25.0000 mg | ORAL_TABLET | Freq: Three times a day (TID) | ORAL | Status: DC
Start: 2021-12-25 — End: 2022-01-04
  Administered 2021-12-25 – 2022-01-04 (×27): 25 mg via ORAL
  Filled 2021-12-25 (×30): qty 1

## 2021-12-25 MED ORDER — PANTOPRAZOLE SODIUM 40 MG IV SOLR
40.0000 mg | Freq: Two times a day (BID) | INTRAVENOUS | Status: DC
  Administered 2021-12-25 – 2021-12-29 (×8): 40 mg via INTRAVENOUS
  Filled 2021-12-25 (×8): qty 10

## 2021-12-25 MED ORDER — LACTATED RINGERS IV SOLN: INTRAVENOUS | Status: AC

## 2021-12-25 MED ORDER — SODIUM CHLORIDE 0.9 % IV SOLN
3.0000 g | Freq: Two times a day (BID) | INTRAVENOUS | Status: DC
  Administered 2021-12-25 – 2021-12-30 (×9): 3 g via INTRAVENOUS
  Filled 2021-12-25 (×11): qty 8

## 2021-12-25 MED ORDER — TECHNETIUM TC 99M MEBROFENIN IV KIT
5.5000 | PACK | Freq: Once | INTRAVENOUS | Status: AC | PRN
  Administered 2021-12-25: 5.5 via INTRAVENOUS

## 2021-12-25 NOTE — Progress Notes (Signed)
Pharmacy Antibiotic Note ? ?[name] is a 86 y.o. female admitted on 12/23/2021 with  intraabdominal infection .  Pharmacy has been consulted for Uansyn dosing.  Scr acutely worsening.  Up to 3.46 today, Est CrCl ~ 13 ml/min ? ?Plan: ?Unasyn 3g IV q 12 hrs for now.  Will need to watch renal function and adjust dosing PRN. ? ?Height: 5\' 7"  (170.2 cm) ?Weight: 82.1 kg (181 lb) ?IBW/kg (Calculated) : 61.6 ? ?Temp (24hrs), Avg:98.6 ?F (37 ?C), Min:97.8 ?F (36.6 ?C), Max:99.4 ?F (37.4 ?C) ? ?Recent Labs  ?Lab 12/23/21 ?0426 12/24/21 ?0018 12/24/21 ?1500 12/24/21 ?1506 12/25/21 ?0017  ?WBC 7.0 11.7*  --   --  12.6*  ?CREATININE 1.87* 2.45*  --  3.11* 3.46*  ?LATICACIDVEN  --   --  2.3*  --   --   ?  ?Estimated Creatinine Clearance: 13.6 mL/min (A) (by C-G formula based on SCr of 3.46 mg/dL (H)).   ? ?Allergies  ?Allergen Reactions  ? Aspirin Shortness Of Breath and Other (See Comments)  ?  Caused asthma symptoms  ? Ramipril Cough  ? ? ?Antimicrobials this admission:  ?Azithromycin 3/25 >  ?Ceftriaxone 3/25 > 3/26 ?Unasyn 3/26 >  ? ?Dose adjustments this admission:  ? ?Microbiology results:  ?3/24 MRSA PCR: neg ? ?Thank you for allowing pharmacy to be a part of this patientSheryls care. ? ?[place] Crane, Pharm D, BCPS, BCCP ?Clinical Pharmacist ? 12/25/2021 11:11 AM  ? ?[facility] pharmacy phone numbers are listed on [email or web address] ? ?

## 2021-12-25 NOTE — Progress Notes (Signed)
11pm:  Received mid-shift report from Carrolyn Meiers RN ?

## 2021-12-25 NOTE — Consult Note (Addendum)
Referring Provider: Dr. Thedore Mins Primary Care Physician:  Olive Bass, FNP Primary Gastroenterologist:  Dr. Kinnie Scales  Reason for Consultation:  Nausea/Vomiting  HPI: [name] is a 86 y.o. female admitted for unstable angina yesterday who has been having persistent nausea and vomiting for the past 5 days. Also developed epigastric and chest pain and weakness and coughing. Managed for NSTEMI and heart cath was cancelled due to pulmonary issues. History of GERD. Black stools X 1 last week but took Pepto Bismol before that episode. Reports formed stool X 2 Thursday. Reports EGD over 10 years ago that showed gastritis and Candida esophagitis. Chart reports normal colonoscopy in 2021 by Dr. Kinnie Scales. Non-contrast CT showed gallstones without cholecystitis and question of pneumonia. Feels better today with less nausea and abdominal pain. Daughter at bedside.  Past Medical History:  Diagnosis Date   Allergic rhinitis    Anterior chest wall pain    Anxiety    Asthma    Chronic diastolic CHF (congestive heart failure) (HCC)    Echo 01/2020: EF 60-65, normal wall motion, mild LVH, normal RV SF, RVSP 52.6 (moderate elevation), severe LAE, moderate RAE, trivial MR, mild MS (mean gradient 5.5 mmHg), mild aortic valve sclerosis (no AS); elevated E/e' c/w elevated LVEDP   Cough    DM type 2 (diabetes mellitus, type 2) (HCC)    GERD (gastroesophageal reflux disease)    History of diverticulitis of colon    HTN (hypertension)    Hyperlipidemia    IBS (irritable bowel syndrome)    Iron deficiency anemia    Osteoporosis, unspecified    Renal insufficiency 07/31/2017   UTI (urinary tract infection)     Past Surgical History:  Procedure Laterality Date   CARDIOVASCULAR STRESS TEST  02/25/04   ESOPHAGOGASTRODUODENOSCOPY  12/26/01   PACEMAKER IMPLANT N/A 08/02/2020   Procedure: PACEMAKER IMPLANT;  Surgeon: Duke Salvia, MD;  Location: [facility] INVASIVE CV LAB;  Service: Cardiovascular;  Laterality: N/A;    RIGHT OOPHORECTOMY  jan 2010    Prior to Admission medications   Medication Sig Start Date End Date Taking? Authorizing Provider  acetaminophen (TYLENOL) 325 MG tablet Take 650 mg by mouth every 6 (six) hours as needed for pain.   Yes [provider]  albuterol (PROAIR HFA) 108 (90 Base) MCG/ACT inhaler Inhale 2 puffs into the lungs every 4 (four) hours as needed for wheezing. 05/05/19 02/04/22 Yes Nyoka Cowden, MD  amLODipine (NORVASC) 5 MG tablet TAKE 1 TABLET BY MOUTH TWICE A DAY Patient taking differently: Take 5 mg by mouth in the morning and at bedtime. 09/27/21  Yes Pricilla Riffle, MD  Ascorbic Acid (VITAMIN C) 1000 MG tablet Take 1,000 mg by mouth daily.   Yes [provider]  budesonide-formoterol (SYMBICORT) 160-4.5 MCG/ACT inhaler TAKE 2 PUFFS FIRST THING IN AM AND THEN ANOTHER 2 PUFFS ABOUT 12 HOURS LATER. Patient taking differently: Inhale 2 puffs into the lungs every 12 (twelve) hours. 07/13/21  Yes Nyoka Cowden, MD  cetirizine (ZYRTEC ALLERGY) 10 MG tablet Take 1 tablet (10 mg total) by mouth daily as needed for allergies. 05/04/20  Yes Nyoka Cowden, MD  diclofenac Sodium (VOLTAREN) 1 % GEL Apply 4 g topically 4 (four) times daily as needed (pain). 01/27/21  Yes [provider]  ELIQUIS 2.5 MG TABS tablet TAKE 1 TABLET BY MOUTH TWICE A DAY Patient taking differently: Take 2.5 mg by mouth 2 (two) times daily. 11/21/21  Yes Duke Salvia, MD  furosemide (  LASIX) 40 MG tablet TAKE 1 TABLET BY MOUTH EVERY DAY Patient taking differently: Take 40 mg by mouth daily as needed for fluid. 02/04/21  Yes Pricilla Riffle, MD  hydrALAZINE (APRESOLINE) 25 MG tablet TAKE 3 TABLETS (75 MG TOTAL) BY MOUTH 3 (THREE) TIMES DAILY. 10/20/21  Yes Pricilla Riffle, MD  insulin lispro (HUMALOG) 100 UNIT/ML injection 5 UNITS WITH BREAKFAST, AND 18 UNITS WITH SUPPER Patient taking differently: Inject 5-20 Units into the skin See admin instructions. 5 units with breakfast and 20 units with  dinner 12/12/21  Yes Romero Belling, MD  latanoprost (XALATAN) 0.005 % ophthalmic solution Place 1 drop into both eyes at bedtime. 03/16/21  Yes [provider]  montelukast (SINGULAIR) 10 MG tablet TAKE 1 TABLET BY MOUTH EVERYDAY AT BEDTIME Patient taking differently: Take 10 mg by mouth at bedtime. 12/22/21  Yes Nyoka Cowden, MD  omeprazole (PRILOSEC) 40 MG capsule Take 1 capsule (40 mg total) by mouth daily. 07/05/21  Yes Nyoka Cowden, MD  potassium chloride (KLOR-CON) 10 MEQ tablet TAKE 1 TABLET BY MOUTH EVERY DAY Patient taking differently: Take 10 mEq by mouth daily. 09/20/21  Yes Duke Salvia, MD  pravastatin (PRAVACHOL) 40 MG tablet Take 1 tablet (40 mg total) by mouth daily. 08/30/21  Yes Olive Bass, FNP  traMADol (ULTRAM) 50 MG tablet Take 50 mg by mouth every 6 (six) hours as needed for moderate pain. 10/12/21  Yes [provider]  azithromycin (ZITHROMAX) 250 MG tablet Take 2 on day one then 1 daily x 4 days Patient not taking: Reported on 12/23/2021 07/04/21   Nyoka Cowden, MD  Blood Glucose Monitoring Suppl (ONE TOUCH ULTRA 2) W/DEVICE KIT Check blood sugar 3 times per day. 07/24/14   Romero Belling, MD  glucose blood [facility] VERIO) test strip 1 each by Other route 2 (two) times daily. And lancets 2/day 12/13/21   Romero Belling, MD  glucose blood test strip Check blood sugar twice a day 04/11/21   Romero Belling, MD  Syringe/Needle, Disp, (SYRINGE 3CC/25GX1") 25G X 1" 3 ML MISC Use to inject into the skin 2x a day 04/11/21   Romero Belling, MD    Scheduled Meds:  amLODipine  5 mg Oral BID   aspirin EC  81 mg Oral Daily   atorvastatin  80 mg Oral Daily   azithromycin  500 mg Oral Daily   fluticasone furoate-vilanterol  1 puff Inhalation Daily   hydrALAZINE  75 mg Oral TID   insulin aspart  0-5 Units Subcutaneous QHS   insulin aspart  0-9 Units Subcutaneous TID WC   latanoprost  1 drop Both Eyes QHS   metoprolol tartrate  12.5 mg Oral BID    montelukast  10 mg Oral QHS   pantoprazole (PROTONIX) IV  40 mg Intravenous Q24H   sodium chloride flush  3 mL Intravenous Q12H   Continuous Infusions:  sodium chloride 10 mL/hr at 12/23/21 1053   cefTRIAXone (ROCEPHIN)  IV 1 g (12/24/21 1653)   heparin 1,000 Units/hr (12/24/21 1431)   nitroGLYCERIN 15 mcg/min (12/25/21 0814)   PRN Meds:.acetaminophen, albuterol, albuterol, metoCLOPramide (REGLAN) injection, nitroGLYCERIN, white petrolatum  Allergies as of 12/23/2021 - Review Complete 12/23/2021  Allergen Reaction Noted   Aspirin Shortness Of Breath and Other (See Comments) 07/06/2011   Ramipril Cough 12/10/2019    Family History  Problem Relation Age of Onset   Lung cancer Brother    Melanoma Brother    Hypertension Mother  Stroke Mother    Other Father        poor circulation   Cancer Sister    Diabetes Daughter    Atopy Neg Hx    Asthma Neg Hx    Breast cancer Neg Hx     Social History   Socioeconomic History   Marital status: Widowed    Spouse name: Not on file   Number of children: Not on file   Years of education: Not on file   Highest education level: Not on file  Occupational History   Occupation: retired  Tobacco Use   Smoking status: Former    Packs/day: 0.30    Years: 5.00    Pack years: 1.50    Types: Cigarettes    Quit date: 10/02/1986    Years since quitting: 35.2   Smokeless tobacco: Never  Vaping Use   Vaping Use: Never used  Substance and Sexual Activity   Alcohol use: No   Drug use: No   Sexual activity: Not on file  Other Topics Concern   Not on file  Social History Narrative   Not on file   Social Determinants of Health   Financial Resource Strain: Not on file  Food Insecurity: Not on file  Transportation Needs: Not on file  Physical Activity: Not on file  Stress: Not on file  Social Connections: Not on file  Intimate Partner Violence: Not on file    Review of Systems: All negative except as stated above in HPI.  Physical  Exam: Vital signs: Vitals:   12/25/21 0034 12/25/21 0736  BP: (!) 105/55 121/67  Pulse: 63 61  Resp: 18 19  Temp: 98.9 F (37.2 C)   SpO2: 97% 98%     General:   Lethargic, elderly, mild acute distress, pleasant, well-nourished  Head: normocephalic, atraumatic Eyes: anicteric sclera ENT: oropharynx clear Neck: supple, nontender Lungs:  Clear throughout to auscultation.   No wheezes, crackles, or rhonchi. No acute distress. Heart:  Regular rate and rhythm; no murmurs, clicks, rubs,  or gallops. Abdomen: epigastric tenderness with guarding, soft, nondistended, +BS  Rectal:  Deferred Ext: no edema  GI:  Lab Results: Recent Labs    12/23/21 0426 12/24/21 0018 12/25/21 0017  WBC 7.0 11.7* 12.6*  HGB 12.0 12.0 10.8*  HCT 35.5* 34.9* 32.1*  PLT 184 150 136*   BMET Recent Labs    12/24/21 0018 12/24/21 1506 12/25/21 0017  NA 137 138 136  K 5.1 4.9 4.9  CL 110 111 108  CO2 18* 18* 19*  GLUCOSE 186* 146* 147*  BUN 22 34* 41*  CREATININE 2.45* 3.11* 3.46*  CALCIUM 9.2 8.9 8.9   LFT Recent Labs    12/23/21 0426 12/25/21 0017  PROT 7.7 6.4*  ALBUMIN 4.2 3.2*  AST 15 48*  ALT 9 19  ALKPHOS 68 61  BILITOT 0.7 0.9  BILIDIR 0.2  --   IBILI 0.5  --    PT/INR No results for input(s): LABPROT, INR in the last 72 hours.   Studies/Results: CT ABDOMEN PELVIS WO CONTRAST  Result Date: 12/24/2021 CLINICAL DATA:  Acute abdominal pain EXAM: CT ABDOMEN AND PELVIS WITHOUT CONTRAST TECHNIQUE: Multidetector CT imaging of the abdomen and pelvis was performed following the standard protocol without IV contrast. RADIATION DOSE REDUCTION: This exam was performed according to the departmental dose-optimization program which includes automated exposure control, adjustment of the mA and/or kV according to patient size and/or use of iterative reconstruction technique. COMPARISON:  12/19/2019 FINDINGS: Lower  chest: Small bilateral pleural effusions, right greater than left. Bibasilar  areas of consolidation, with areas of right lower lobe airspace disease concerning for pneumonia. No pneumothorax. Dense calcification of the mitral and aortic valves. Single lead pacer. Cardiomegaly without pericardial effusion. Hepatobiliary: Multiple calcified gallstones without evidence of acute cholecystitis. Stable hypodensities within the right lobe liver measuring 1.41.3 cm respectively, likely small cysts or hemangiomas. No intrahepatic duct dilation. Pancreas: Unremarkable unenhanced appearance. Spleen: Unremarkable unenhanced appearance. Adrenals/Urinary Tract: Bilateral renal cortical thinning. No urinary tract calculi or obstructive uropathy. 2.3 cm peripelvic hypodensity right kidney image 29/3, new since prior study, compatible with a cyst within the attenuation of 9 Hounsfield units. No other focal parenchymal abnormalities. The adrenals and bladder are unremarkable. Stomach/Bowel: No bowel obstruction or ileus. Normal appendix right lower quadrant. No bowel wall thickening or inflammatory change. Vascular/Lymphatic: Aortic atherosclerosis. No enlarged abdominal or pelvic lymph nodes. Reproductive: Status post hysterectomy. No adnexal masses. Other: No free fluid or free intraperitoneal gas. No abdominal wall hernia. Musculoskeletal: No acute or destructive bony lesions. Reconstructed images demonstrate no additional findings. IMPRESSION: 1. Cholelithiasis without cholecystitis. 2. Right lower lobe airspace disease concerning for pneumonia. Other scattered areas of bilateral lower lobe consolidation favor atelectasis. 3. Small bilateral pleural effusions, right greater than left. 4. Cardiomegaly. 5.  Aortic Atherosclerosis (ICD10-I70.0). Electronically Signed   By: Sharlet Salina M.D.   On: 12/24/2021 15:38     Impression/Plan: Intractable nausea/vomiting and epigastric pain in the setting of a NSTEMI and pneumonia on IV Abx. I do not think she has an ulcer causing her symptoms and think it is  related to her pneumonia and cardiac issues. Increase Protonix to 40 mg IV Q 12 hours. Continue Reglan for now. AST 48 but otherwise LFTs within normal limits. Doubt gallbladder source. HIDA scan ordered by Dr. Thedore Mins and keep NPO until HIDA scan. She reports being hungry. Supportive care. Dr. Loreta Ave from unassigned team to f/u tomorrow since pt normally sees Dr. Kinnie Scales.    LOS: 2 days   Shirley Friar  12/25/2021, 9:40 AM  Questions please call [phone]

## 2021-12-25 NOTE — Plan of Care (Signed)

## 2021-12-25 NOTE — Progress Notes (Addendum)
? ?Progress Note ? ?Patient Name: [name] ?Date of Encounter: 12/25/2021 ? ?Primary Cardiologist: Dorris Carnes, MD  ? ? ? ?Patient Profile  ?   ?86 y.o. female with a history of complete heart block status post pacemaker, diabetes, permanent atrial fibrillation, chronic renal insufficiency and asthma admitted with chest pain; peak troponin 346 ?Echo 4/21 demonstrated normal LV function, moderate pulmonary hypertension, severe left atrial enlargement and mild mitral stenosis ?Echo 3/23 EF 30-35%  RVE  MS mean grad 6.5  ? ?Catheterization was deferred because of suspected acute pulmonary edema ? ?Subjective  ? ?Breathing is better.  Still with abdominal discomfort.  No chest pain.  Thirsty. ? ?Inpatient Medications  ?  ?Scheduled Meds: ? amLODipine  5 mg Oral BID  ? aspirin EC  81 mg Oral Daily  ? atorvastatin  80 mg Oral Daily  ? azithromycin  500 mg Oral Daily  ? fluticasone furoate-vilanterol  1 puff Inhalation Daily  ? hydrALAZINE  75 mg Oral TID  ? insulin aspart  0-5 Units Subcutaneous QHS  ? insulin aspart  0-9 Units Subcutaneous TID WC  ? latanoprost  1 drop Both Eyes QHS  ? metoprolol tartrate  12.5 mg Oral BID  ? montelukast  10 mg Oral QHS  ? pantoprazole (PROTONIX) IV  40 mg Intravenous Q12H  ? sodium chloride flush  3 mL Intravenous Q12H  ? ?Continuous Infusions: ? sodium chloride 10 mL/hr at 12/23/21 1053  ? cefTRIAXone (ROCEPHIN)  IV 1 g (12/24/21 1653)  ? heparin 1,000 Units/hr (12/25/21 1115)  ? nitroGLYCERIN 15 mcg/min (12/25/21 1191)  ? ?PRN Meds: ?acetaminophen, albuterol, albuterol, metoCLOPramide (REGLAN) injection, nitroGLYCERIN, white petrolatum  ? ?Vital Signs  ?  ?Vitals:  ? 12/24/21 2000 12/25/21 0034 12/25/21 0736 12/25/21 1106  ?BP: 127/65 (!) 105/55 121/67 (!) 104/53  ?Pulse: 64 63 61 60  ?Resp: (!) 26 18 19 20   ?Temp: 99.4 ?F (37.4 ?C) 98.9 ?F (37.2 ?C)    ?TempSrc: Oral Oral    ?SpO2: 100% 97% 98%   ?Weight:      ?Height:      ? ? ?Intake/Output Summary (Last 24 hours) at 12/25/2021  1131 ?Last data filed at 12/25/2021 0505 ?Gross per 24 hour  ?Intake 860 ml  ?Output 700 ml  ?Net 160 ml  ? ? ?Filed Weights  ? 12/23/21 0409 12/23/21 1025  ?Weight: 82.1 kg 82.1 kg  ? ? ?Telemetry  ?  ?Atrial fibrillation with ventricular pacing personally Reviewed ? ?ECG  ?  ?Atrial fibrillation with underlying ventricular pacing- Personally Reviewed ? ?Physical Exam  ?  ?Well developed and nourished in no acute distress ?HENT normal ?Neck supple  ?Clear ?Regular rate and rhythm, no murmurs or gallops ?Abd-soft with active BS ?No Clubbing cyanosis edema ?Skin-warm and dry; turgor normal ?A & Oriented  Grossly normal sensory and motor function ? ?  ? ?Labs  ?  ?Chemistry ?Recent Labs  ?Lab 12/23/21 ?0426 12/24/21 ?0018 12/24/21 ?1506 12/25/21 ?0017  ?NA 139 137 138 136  ?K 4.0 5.1 4.9 4.9  ?CL 108 110 111 108  ?CO2 16* 18* 18* 19*  ?GLUCOSE 191* 186* 146* 147*  ?BUN 21 22 34* 41*  ?CREATININE 1.87* 2.45* 3.11* 3.46*  ?CALCIUM 9.9 9.2 8.9 8.9  ?PROT 7.7  --   --  6.4*  ?ALBUMIN 4.2  --   --  3.2*  ?AST 15  --   --  48*  ?ALT 9  --   --  19  ?  ALKPHOS 68  --   --  61  ?BILITOT 0.7  --   --  0.9  ?GFRNONAA 26* 19* 14* 13*  ?ANIONGAP 15 9 9 9   ? ?  ? ?Hematology ?Recent Labs  ?Lab 12/23/21 ?0426 12/24/21 ?0018 12/25/21 ?0017  ?WBC 7.0 11.7* 12.6*  ?RBC 4.59 4.41 3.99  ?HGB 12.0 12.0 10.8*  ?HCT 35.5* 34.9* 32.1*  ?MCV 77.3* 79.1* 80.5  ?MCH 26.1 27.2 27.1  ?MCHC 33.8 34.4 33.6  ?RDW 16.2* 15.8* 15.6*  ?PLT 184 150 136*  ? ? ? ?Cardiac Enzymes troponin high-sensitivity 346 ? ?BNP ?Recent Labs  ?Lab 12/23/21 ?1030  ?BNP 230.4*  ? ?  ? ?DDimer No results for input(s): DDIMER in the last 168 hours.  ? ?Radiology  ?  ?CT ABDOMEN PELVIS WO CONTRAST ? ?Result Date: 12/24/2021 ?CLINICAL DATA:  Acute abdominal pain EXAM: CT ABDOMEN AND PELVIS WITHOUT CONTRAST TECHNIQUE: Multidetector CT imaging of the abdomen and pelvis was performed following the standard protocol without IV contrast. RADIATION DOSE REDUCTION: This exam was  performed according to the departmental dose-optimization program which includes automated exposure control, adjustment of the mA and/or kV according to patient size and/or use of iterative reconstruction technique. COMPARISON:  12/19/2019 FINDINGS: Lower chest: Small bilateral pleural effusions, right greater than left. Bibasilar areas of consolidation, with areas of right lower lobe airspace disease concerning for pneumonia. No pneumothorax. Dense calcification of the mitral and aortic valves. Single lead pacer. Cardiomegaly without pericardial effusion. Hepatobiliary: Multiple calcified gallstones without evidence of acute cholecystitis. Stable hypodensities within the right lobe liver measuring 1.41.3 cm respectively, likely small cysts or hemangiomas. No intrahepatic duct dilation. Pancreas: Unremarkable unenhanced appearance. Spleen: Unremarkable unenhanced appearance. Adrenals/Urinary Tract: Bilateral renal cortical thinning. No urinary tract calculi or obstructive uropathy. 2.3 cm peripelvic hypodensity right kidney image 29/3, new since prior study, compatible with a cyst within the attenuation of 9 Hounsfield units. No other focal parenchymal abnormalities. The adrenals and bladder are unremarkable. Stomach/Bowel: No bowel obstruction or ileus. Normal appendix right lower quadrant. No bowel wall thickening or inflammatory change. Vascular/Lymphatic: Aortic atherosclerosis. No enlarged abdominal or pelvic lymph nodes. Reproductive: Status post hysterectomy. No adnexal masses. Other: No free fluid or free intraperitoneal gas. No abdominal wall hernia. Musculoskeletal: No acute or destructive bony lesions. Reconstructed images demonstrate no additional findings. IMPRESSION: 1. Cholelithiasis without cholecystitis. 2. Right lower lobe airspace disease concerning for pneumonia. Other scattered areas of bilateral lower lobe consolidation favor atelectasis. 3. Small bilateral pleural effusions, right greater than  left. 4. Cardiomegaly. 5.  Aortic Atherosclerosis (ICD10-I70.0). Electronically Signed   By: Randa Ngo M.D.   On: 12/24/2021 15:38  ? ?DG Chest 1 View ? ?Result Date: 12/24/2021 ?CLINICAL DATA:  Follow-up study. EXAM: CHEST  1 VIEW COMPARISON:  12/23/2021 and older exams.  Chest CT, 12/30/2019. FINDINGS: Moderate enlargement of the cardiopericardial silhouette. No convincing mediastinal or hilar masses. Hazy opacity in the right lung becoming more confluent at the base where the right hemidiaphragm is silhouetted, increased when compared to the prior exam. Milder opacity noted in the medial left lung base. Small bilateral pleural effusions, right suspected to be increased compared to the prior radiograph. No pneumothorax. Stable left anterior chest wall single lead pacemaker. IMPRESSION: 1. Interval worsening in right lung aeration with an increase in hazy opacity in the right lung and more confluent opacity at the right lung base. Suspect combination of an enlarged right pleural effusion with dependent atelectasis and/or asymmetric edema. Electronically Signed  By: Lajean Manes M.D.   On: 12/24/2021 15:36  ? ?DG Chest Port 1 View ? ?Result Date: 12/25/2021 ?CLINICAL DATA:  Shortness of breath EXAM: PORTABLE CHEST 1 VIEW COMPARISON:  12/24/2021 FINDINGS: Cardiomegaly and vascular pedicle widening. Congested appearance of vessels with interstitial coarsening. Small volume pleural effusions. Improved aeration at the right base. No pneumothorax. Single chamber pacer into the right ventricle. IMPRESSION: Cardiomegaly and vascular congestion. Right lower lobe aeration has improved. Electronically Signed   By: Jorje Guild M.D.   On: 12/25/2021 07:56  ? ?ECHOCARDIOGRAM COMPLETE ? ?Result Date: 12/23/2021 ?   ECHOCARDIOGRAM REPORT   Patient Name:   BARRY CULVERHOUSE Date of Exam: 12/23/2021 Medical Rec #:  [id number]   Height:       67.0 in Accession #:    [id number]  Weight:       181.0 lb Date of Birth:  1936-01-19    BSA:           1.938 m? Patient Age:    67 years    BP:           134/60 mmHg Patient Gender: F           HR:           62 bpm. Exam Location:  Inpatient Procedure: 2D Echo, Color Doppler, Cardiac Doppler and Int

## 2021-12-25 NOTE — Progress Notes (Signed)
?                                  PROGRESS NOTE                                             ?                                                                                                                     ?                                         ? ? Patient Demographics:  ? ? [name], is a 86 y.o. female, DOB - [birth year], HGD:[id number] ? ?Outpatient Primary MD for the patient is [name], [place]    LOS - 2  Admit date - 12/23/2021   ? ?Chief Complaint  ?Patient presents with  ? Chest Pain  ?    ? ?Brief Narrative (HPI from H&P) 86 y.o. female with medical history significant of chronic systolic CHF LVEF 96-22%, PAF on Eliquis, IDDM, HTN, HLD, CKD stage IIIb, GERD, COPD Gold stage II, Heart block on PPM, who was admitted under cardiology service for continuous onset of chest pain since yesterday, complained about persistent feeling nausea and frequent dry heaves for 3 days.  Was admitted by cardiology service placed on heparin drip for NSTEMI and hospitalist team was consulted for nausea evaluation ? ? Subjective:  ? ? [name] today has, No headache, No chest pain, has mild nausea with some intermittent right upper quadrant abdominal disc, No new weakness tingling or numbness, no shortness of breath ? ? Assessment  & Plan :  ? ?Chest pain with NSTEMI acute on chronic CHF decompensation with EF 30% and regional wall motion abnormalities on echocardiogram.  Defer management of this issue to primary team which is cardiology, currently on heparin and nitroglycerin drip, also on aspirin and statin, low-dose beta-blocker continued, defer further management to cardiology team. ? ?2.  Persistent nausea with some right upper quadrant pain and tenderness on exam.  CT scan revealed gallstones, since she has right upper quadrant tenderness there is question of possible chronic cholecystitis for which I will obtain HIDA scan, already on PPI, will have her GI team opine as well.   For now bowel rest, Unasyn and monitor.  Note nausea could be due to NSTEMI as well. ? ?3.  Possible pneumonia.  No cough, chest x-ray inconclusive, continue Unasyn and azithromycin which was started yesterday will be continued for now.  Monitor clinically ? ?4.  Paroxysmal atrial fibrillation.  Mali vas 2 score of greater than 3.  Currently on heparin drip instead of home Eliquis, low-dose beta-blocker.  Defer management of this issue to cardiology which is the primary team. ? ?5.  COPD.  Stable.  Supportive care. ? ?6.  AKI on CKD 3B.  Baseline creatinine around 1.8.  Currently appears to be dehydrated, oral intake has been poor due to intractable nausea for several days, hold further diuresis, may need to be gently hydrated, have communicated this to the cardiology team. ? ?7.  DM type II.  Recent A1c 5.4.  Sliding scale for now ? ?CBG (last 3)  ?Recent Labs  ?  12/24/21 ?1622 12/24/21 ?2101 12/25/21 ?0616  ?GLUCAP 140* 163* 150*  ? ? ? ?   ? ?Condition - Extremely Guarded ? ?Family Communication  : Daughter bedside on 12/25/2021 ? ?Code Status :  Full ? ?Consults  : [facility] consulting, GI consulting, cardiology primary ? ?PUD Prophylaxis : PPI ? ? Procedures  :    ? ?HIDA ? ?CT - 1. Cholelithiasis without cholecystitis. 2. Right lower lobe airspace disease concerning for pneumonia. Other scattered areas of bilateral lower lobe consolidation favor atelectasis. 3. Small bilateral pleural effusions, right greater than left. 4. Cardiomegaly. 5.  Aortic Atherosclerosis  ? ?TTE - 1. Left ventricular ejection fraction, by estimation, is 30 to 35%. The left ventricle has moderately decreased function. The left ventricle demonstrates regional wall motion abnormalities (see scoring diagram/findings for description). There is mild concentric left ventricular hypertrophy. Indeterminate diastolic filling due to E-A fusion. Elevated left ventricular end-diastolic pressure.  2. Right ventricular systolic function is mildly  reduced. The right ventricular size is moderately enlarged. There is moderately elevated pulmonary artery systolic pressure.  3. Left atrial size was severely dilated.  4. Right atrial size was moderately dilated.  5. The mitral valve is abnormal. Trivial mitral valve regurgitation. Moderate mitral stenosis. The mean mitral valve gradient is 6.5 mmHg.  6. Tricuspid valve regurgitation is moderate.  7. The aortic valve is tricuspid. There is mild calcification of the aortic valve. There is mild thickening of the aortic valve. Aortic valve regurgitation is trivial. Aortic valve sclerosis is present, with no evidence of aortic valve stenosis.  8. The inferior vena cava is dilated in size with >50% respiratory variability, suggesting right atrial pressure of 8 mmHg. Comparison(s): Prior images reviewed side by side. Changes from prior study are noted. Conclusion(s)/Recommendation(s): Reduced LVEF with new wall motion abnormalities. No LV thrombus. Elevated LVEDP. Findings communicated to Dr. Acie Fredrickson. ? ?   ? ?Disposition Plan  :   ? ?Status is: Inpatient ? ? ?DVT Prophylaxis  :  Hep gtt ? ?Lab Results  ?Component Value Date  ? PLT 136 (L) 12/25/2021  ? ? ?Diet :  ?Diet Order   ? ?       ?  Diet NPO time specified Except for: Sips with Meds  Diet effective now       ?  ? ?  ?  ? ?  ?  ? ?Inpatient Medications ? ?Scheduled Meds: ? amLODipine  5 mg Oral BID  ? aspirin EC  81 mg Oral Daily  ? atorvastatin  80 mg Oral Daily  ? azithromycin  500 mg Oral Daily  ? fluticasone furoate-vilanterol  1 puff Inhalation Daily  ? hydrALAZINE  75 mg Oral TID  ? insulin aspart  0-5 Units Subcutaneous QHS  ? insulin aspart  0-9 Units Subcutaneous TID WC  ? latanoprost  1 drop Both Eyes QHS  ? metoprolol tartrate  12.5 mg Oral BID  ? montelukast  10 mg Oral QHS  ?  pantoprazole (PROTONIX) IV  40 mg Intravenous Q12H  ? sodium chloride flush  3 mL Intravenous Q12H  ? ?Continuous Infusions: ? sodium chloride 10 mL/hr at 12/23/21 1053  ?  cefTRIAXone (ROCEPHIN)  IV 1 g (12/24/21 1653)  ? heparin 1,000 Units/hr (12/24/21 1431)  ? nitroGLYCERIN 15 mcg/min (12/25/21 0174)  ? ?PRN Meds:.acetaminophen, albuterol, albuterol, metoCLOPramide (REGLAN) injection, nitroGLYCERIN, white petrolatum ? ?Antibiotics  :   ? ?Anti-infectives (From admission, onward)  ? ? Start     Dose/Rate Route Frequency Ordered Stop  ? 12/24/21 1715  cefTRIAXone (ROCEPHIN) 1 g in sodium chloride 0.9 % 100 mL IVPB       ? 1 g ?200 mL/hr over 30 Minutes Intravenous Every 24 hours 12/24/21 1616    ? 12/24/21 1715  azithromycin (ZITHROMAX) tablet 500 mg       ? 500 mg Oral Daily 12/24/21 1616    ? ?  ? ? ? Time Spent in minutes  30 ? ? ?Lala Lund M.D on 12/25/2021 at 10:45 AM ? ?To page go to [email or web address]  ? ?[facility] -  Office  [phone] ? ?See all Orders from today for further details ? ? ? Objective:  ? ?Vitals:  ? 12/24/21 1627 12/24/21 2000 12/25/21 0034 12/25/21 0736  ?BP: (!) 118/53 127/65 (!) 105/55 121/67  ?Pulse: 65 64 63 61  ?Resp: 20 (!) 26 18 19   ?Temp: 98.4 ?F (36.9 ?C) 99.4 ?F (37.4 ?C) 98.9 ?F (37.2 ?C)   ?TempSrc: Oral Oral Oral   ?SpO2: 98% 100% 97% 98%  ?Weight:      ?Height:      ? ? ?Wt Readings from Last 3 Encounters:  ?12/23/21 82.1 kg  ?12/12/21 82.5 kg  ?11/04/21 83.1 kg  ? ? ? ?Intake/Output Summary (Last 24 hours) at 12/25/2021 1045 ?Last data filed at 12/25/2021 0505 ?Gross per 24 hour  ?Intake 860 ml  ?Output 700 ml  ?Net 160 ml  ? ? ? ?Physical Exam ? ?Awake Alert, No new F.N deficits, Normal affect ?[place].AT,PERRAL ?Supple Neck, No JVD,   ?Symmetrical Chest wall movement, Good air movement bilaterally, CTAB ?RRR,No Gallops,Rubs or new Murmurs,  ?+ve B.Sounds, Abd Soft, +ve RUQ tenderness,   ?No Cyanosis, Clubbing or edema  ?  ? ? Data Review:  ? ? ?CBC ?Recent Labs  ?Lab 12/23/21 ?0426 12/24/21 ?0018 12/25/21 ?0017  ?WBC 7.0 11.7* 12.6*  ?HGB 12.0 12.0 10.8*  ?HCT 35.5* 34.9* 32.1*  ?PLT 184 150 136*  ?MCV 77.3* 79.1* 80.5  ?MCH 26.1 27.2 27.1   ?MCHC 33.8 34.4 33.6  ?RDW 16.2* 15.8* 15.6*  ? ? ?Electrolytes ?Recent Labs  ?Lab 12/23/21 ?0426 12/23/21 ?1030 12/24/21 ?0018 12/24/21 ?1500 12/24/21 ?1506 12/25/21 ?0017  ?NA 139  --  137  --  138 136  ?K 4.0

## 2021-12-25 NOTE — Progress Notes (Signed)
ANTICOAGULATION CONSULT NOTE ? ?Pharmacy Consult for Heparin ?Indication: chest pain/ACS ? ?Allergies  ?Allergen Reactions  ? Aspirin Shortness Of Breath and Other (See Comments)  ?  Caused asthma symptoms  ? Ramipril Cough  ? ? ?Patient Measurements: ?Height: 5\' 7"  (170.2 cm) ?Weight: 82.1 kg (181 lb) ?IBW/kg (Calculated) : 61.6 ?Heparin Dosing Weight: 75 kg ? ?Vital Signs: ?Temp: 98.9 ?F (37.2 ?C) (03/26 0034) ?Temp Source: Oral (03/26 0034) ?BP: 104/53 (03/26 1106) ?Pulse Rate: 60 (03/26 1106) ? ?Labs: ?Recent Labs  ?  12/23/21 ?0426 12/23/21 ?0617 12/23/21 ?1615 12/24/21 ?0018 12/24/21 ?1506 12/25/21 ?0017  ?HGB 12.0  --   --  12.0  --  10.8*  ?HCT 35.5*  --   --  34.9*  --  32.1*  ?PLT 184  --   --  150  --  136*  ?APTT  --   --  83* 82*  --  72*  ?HEPARINUNFRC  --   --  1.09* 0.96*  --  0.80*  ?CREATININE 1.87*  --   --  2.45* 3.11* 3.46*  ?TROPONINIHS 187* 346*  --   --   --   --   ? ? ? ?Estimated Creatinine Clearance: 13.6 mL/min (A) (by C-G formula based on SCr of 3.46 mg/dL (H)). ? ?  ? ?Assessment: ?86 y.o. female with chest pain to continue on IV heparin.  Patient was on Eliquis PTA for history of Afib, so will use aPTT to guide heparin dosing until aPTT and heparin level correlate.  Last dose of Eliquis was on 3/23 at 1200. ? ?aPTT remains therapeutic at 72 sec; heparin level elevated as expected from recent Eliquis.  No issues reported.  CBC stable. ? ?Goal of Therapy:  ?Heparin level 0.3-0.7 units/ml ?Monitor platelets by anticoagulation protocol: Yes ?  ?Plan:  ?Continue heparin infusion at 1000 units/hr ?Daily aPTT, heparin level and CBC. ?F/u plans to resume Eliquis eventually. ? ?[place] Crane, Pharm D, BCPS, BCCP ?Clinical Pharmacist ? 12/25/2021 11:07 AM  ? ?[facility] pharmacy phone numbers are listed on [email or web address] ? ? ?

## 2021-12-26 ENCOUNTER — Encounter (HOSPITAL_COMMUNITY)
Admission: EM | Disposition: A | Discharge status: Home Health | Payer: Self-pay | Source: Home / Self Care | Attending: Internal Medicine

## 2021-12-26 ENCOUNTER — Inpatient Hospital Stay (HOSPITAL_COMMUNITY): Payer: Medicare Other

## 2021-12-26 DIAGNOSIS — I5021 Acute systolic (congestive) heart failure: Secondary | ICD-10-CM

## 2021-12-26 DIAGNOSIS — I214 Non-ST elevation (NSTEMI) myocardial infarction: Secondary | ICD-10-CM | POA: Diagnosis not present

## 2021-12-26 DIAGNOSIS — N184 Chronic kidney disease, stage 4 (severe): Secondary | ICD-10-CM

## 2021-12-26 DIAGNOSIS — N179 Acute kidney failure, unspecified: Secondary | ICD-10-CM

## 2021-12-26 LAB — GLUCOSE, CAPILLARY
Glucose-Capillary: 153 mg/dL — ABNORMAL HIGH (ref 70–99)
Glucose-Capillary: 152 mg/dL — ABNORMAL HIGH (ref 70–99)
Glucose-Capillary: 148 mg/dL — ABNORMAL HIGH (ref 70–99)
Glucose-Capillary: 174 mg/dL — ABNORMAL HIGH (ref 70–99)
Glucose-Capillary: 194 mg/dL — ABNORMAL HIGH (ref 70–99)
Glucose-Capillary: 188 mg/dL — ABNORMAL HIGH (ref 70–99)

## 2021-12-26 LAB — CBC
HCT: 27.9 % — ABNORMAL LOW (ref 36.0–46.0)
Hemoglobin: 9.7 g/dL — ABNORMAL LOW (ref 12.0–15.0)
MCH: 27.6 pg (ref 26.0–34.0)
MCHC: 34.8 g/dL (ref 30.0–36.0)
MCV: 79.5 fL — ABNORMAL LOW (ref 80.0–100.0)
Platelets: 124 10*3/uL — ABNORMAL LOW (ref 150–400)
RBC: 3.51 MIL/uL — ABNORMAL LOW (ref 3.87–5.11)
RDW: 15.3 % (ref 11.5–15.5)
WBC: 12 10*3/uL — ABNORMAL HIGH (ref 4.0–10.5)
nRBC: 1.4 % — ABNORMAL HIGH (ref 0.0–0.2)

## 2021-12-26 LAB — COMPREHENSIVE METABOLIC PANEL
ALT: 20 U/L (ref 0–44)
AST: 33 U/L (ref 15–41)
Albumin: 2.9 g/dL — ABNORMAL LOW (ref 3.5–5.0)
Alkaline Phosphatase: 65 U/L (ref 38–126)
Anion gap: 7 (ref 5–15)
BUN: 54 mg/dL — ABNORMAL HIGH (ref 8–23)
CO2: 18 mmol/L — ABNORMAL LOW (ref 22–32)
Calcium: 8.5 mg/dL — ABNORMAL LOW (ref 8.9–10.3)
Chloride: 111 mmol/L (ref 98–111)
Creatinine, Ser: 3.17 mg/dL — ABNORMAL HIGH (ref 0.44–1.00)
GFR, Estimated: 14 mL/min — ABNORMAL LOW (ref >60)
Glucose, Bld: 153 mg/dL — ABNORMAL HIGH (ref 70–99)
Potassium: 4.4 mmol/L (ref 3.5–5.1)
Sodium: 136 mmol/L (ref 135–145)
Total Bilirubin: 1.6 mg/dL — ABNORMAL HIGH (ref 0.3–1.2)
Total Protein: 6 g/dL — ABNORMAL LOW (ref 6.5–8.1)

## 2021-12-26 LAB — URINALYSIS, COMPLETE (UACMP) WITH MICROSCOPIC
Bilirubin Urine: NEGATIVE
Glucose, UA: NEGATIVE mg/dL
Hgb urine dipstick: NEGATIVE
Ketones, ur: NEGATIVE mg/dL
Nitrite: NEGATIVE
Protein, ur: NEGATIVE mg/dL
Specific Gravity, Urine: 1.013 (ref 1.005–1.030)
pH: 5 (ref 5.0–8.0)

## 2021-12-26 LAB — APTT: aPTT: 62 seconds — ABNORMAL HIGH (ref 24–36)

## 2021-12-26 LAB — BRAIN NATRIURETIC PEPTIDE: B Natriuretic Peptide: 1327.6 pg/mL — ABNORMAL HIGH (ref 0.0–100.0)

## 2021-12-26 LAB — PROCALCITONIN: Procalcitonin: 0.83 ng/mL

## 2021-12-26 LAB — HEPARIN LEVEL (UNFRACTIONATED): Heparin Unfractionated: 0.72 IU/mL — ABNORMAL HIGH (ref 0.30–0.70)

## 2021-12-26 LAB — MAGNESIUM: Magnesium: 2 mg/dL (ref 1.7–2.4)

## 2021-12-26 SURGERY — LEFT HEART CATH AND CORONARY ANGIOGRAPHY
Anesthesia: LOCAL

## 2021-12-26 MED ORDER — LACTATED RINGERS IV SOLN: INTRAVENOUS | Status: AC

## 2021-12-26 MED ORDER — LACTATED RINGERS IV SOLN
INTRAVENOUS | Status: DC
Start: 2021-12-26 — End: 2021-12-26

## 2021-12-26 MED ORDER — LACTATED RINGERS IV SOLN: INTRAVENOUS | Status: DC

## 2021-12-26 MED ORDER — BISACODYL 5 MG PO TBEC
5.0000 mg | DELAYED_RELEASE_TABLET | Freq: Every day | ORAL | Status: DC | PRN
  Administered 2021-12-26 – 2021-12-27 (×2): 5 mg via ORAL
  Filled 2021-12-26 (×2): qty 1

## 2021-12-26 NOTE — Consult Note (Signed)
[facility] ?Renal Consultation Note ? ?Requesting MD: Harrington Challenger ?Indication for Consultation: A on CRF ? ?HPI:  ?[name] is a 86 y.o. female with DM, HTN, chronic diastolic heart failure and Afib.  She also appears to have CKD with crt in the mid 1's basically from 2015 to 2020.  In 2021 and 2022 noted to have 3 episodes of A on CRF that would improve- most recently in October of 22 crt was noted to be 1.77 after being over 2 in March and April of 2022.  She presented to the ER on 3/24 with complaint of CP with ST depression and decreased EF that was new.  Her presenting crt was 1.87. They were going to cath her but case cancelled as they felt she was in pulmonary edema-  attempted to diurese.  Was having good UOP but not really diuresing-  SBP got as low as 90-  BP meds have been reduced.  BP still soft.  Her crt worsened from admission daily to 2.45-3.11 -3.46.  today however,crt is down a little -  UOP last 24 hours was 925.  On norvasc 5 and hydralazine 25 ( decreased yest per Dr. Caryl Comes from norvasc 10 and hydralazine 75.  Hosp has also been complicated by nausea, possible PNA on abx.  Renal ultrasound shows good sized kidneys but echogenic-  no hydro - no recent urinalysis.  Pt is still c/o some dry heaves and belching-  also has some mid LQ abdominal pain-  getting IVF at 50 per hour but has a stop point-  CXR this AM showed cardiomegaly with congested vessels and possible pleural effusions.  She says she is now seeing a kidney MD at [facility] but cant remember the name  ? ?Creat  ?Date/Time Value Ref Range Status  ?08/28/2016 10:42 AM 1.74 (H) 0.60 - 0.93 mg/dL Final  ?  Comment:  ?    ?For patients > or = 84 years of age: The upper reference limit for ?Creatinine is approximately 13% higher for people identified as ?African-American. ?  ?  ?07/21/2016 07:33 AM 1.52 (H) 0.60 - 0.93 mg/dL Final  ?  Comment:  ?    ?For patients > or = 84 years of age: The upper reference limit for ?Creatinine is  approximately 13% higher for people identified as ?African-American. ?  ?  ?06/26/2016 09:10 AM 1.59 (H) 0.60 - 0.93 mg/dL Final  ?  Comment:  ?    ?For patients > or = 84 years of age: The upper reference limit for ?Creatinine is approximately 13% higher for people identified as ?African-American. ?  ?  ?09/17/2012 08:30 AM 1.30 (H) 0.50 - 1.10 mg/dL Final  ? ?Creatinine, Ser  ?Date/Time Value Ref Range Status  ?12/26/2021 01:39 AM 3.17 (H) 0.44 - 1.00 mg/dL Final  ?12/25/2021 12:17 AM 3.46 (H) 0.44 - 1.00 mg/dL Final  ?12/24/2021 03:06 PM 3.11 (H) 0.44 - 1.00 mg/dL Final  ?12/24/2021 12:18 AM 2.45 (H) 0.44 - 1.00 mg/dL Final  ?12/23/2021 04:26 AM 1.87 (H) 0.44 - 1.00 mg/dL Final  ?07/28/2021 08:52 AM 1.77 (H) 0.57 - 1.00 mg/dL Final  ?01/12/2021 11:38 AM 2.28 (H) 0.40 - 1.20 mg/dL Final  ?12/20/2020 09:32 AM 2.34 (H) 0.57 - 1.00 mg/dL Final  ?08/06/2020 03:26 PM 1.89 (H) 0.57 - 1.00 mg/dL Final  ?08/03/2020 03:05 AM 2.53 (H) 0.44 - 1.00 mg/dL Final  ?08/02/2020 07:55 AM 2.91 (H) 0.44 - 1.00 mg/dL Final  ?08/01/2020 09:46 AM 2.78 (H) 0.44 -  1.00 mg/dL Final  ?07/31/2020 06:04 AM 2.51 (H) 0.44 - 1.00 mg/dL Final  ?07/30/2020 03:57 AM 2.06 (H) 0.44 - 1.00 mg/dL Final  ?07/29/2020 11:21 AM 2.01 (H) 0.44 - 1.00 mg/dL Final  ?01/30/2020 09:53 AM 1.79 (H) 0.57 - 1.00 mg/dL Final  ?01/06/2020 12:50 PM 1.97 (H) 0.40 - 1.20 mg/dL Final  ?12/24/2019 11:50 AM 1.68 (H) 0.40 - 1.20 mg/dL Final  ?12/21/2019 04:17 AM 2.02 (H) 0.44 - 1.00 mg/dL Final  ?12/20/2019 11:41 AM 2.31 (H) 0.44 - 1.00 mg/dL Final  ?12/20/2019 04:29 AM 2.41 (H) 0.44 - 1.00 mg/dL Final  ?12/20/2019 12:08 AM 2.51 (H) 0.44 - 1.00 mg/dL Final  ?12/19/2019 02:13 PM 2.35 (H) 0.44 - 1.00 mg/dL Final  ?08/27/2019 11:20 AM 1.57 (H) 0.40 - 1.20 mg/dL Final  ?07/08/2018 09:01 AM 1.59 (H) 0.40 - 1.20 mg/dL Final  ?06/04/2018 11:03 AM 1.57 (H) 0.57 - 1.00 mg/dL Final  ?03/22/2018 11:10 AM 1.88 (H) 0.44 - 1.00 mg/dL Final  ?07/31/2017 10:36 AM 1.57 (H) 0.40 - 1.20 mg/dL  Final  ?06/01/2017 09:08 AM 1.53 (H) 0.40 - 1.20 mg/dL Final  ?05/02/2017 04:07 PM 1.36 (H) 0.40 - 1.20 mg/dL Final  ?11/09/2016 11:56 AM 1.45 (H) 0.40 - 1.20 mg/dL Final  ?10/24/2016 08:33 AM 1.35 (H) 0.57 - 1.00 mg/dL Final  ?04/12/2016 12:03 PM 1.47 (H) 0.40 - 1.20 mg/dL Final  ?09/15/2014 10:19 AM 1.3 (H) 0.4 - 1.2 mg/dL Final  ?08/21/2014 11:56 AM 1.4 (H) 0.4 - 1.2 mg/dL Final  ?05/28/2014 08:57 AM 1.4 (H) 0.4 - 1.2 mg/dL Final  ?05/02/2014 08:08 PM 1.89 (H) 0.50 - 1.10 mg/dL Final  ?04/29/2014 09:55 AM 1.18 (H) 0.50 - 1.10 mg/dL Final  ?12/09/2013 08:54 AM 1.4 (H) 0.4 - 1.2 mg/dL Final  ?09/01/2013 11:25 AM 1.5 (H) 0.4 - 1.2 mg/dL Final  ?12/31/2012 09:14 AM 1.10 0.50 - 1.10 mg/dL Final  ?08/02/2011 11:16 AM 1.4 (H) 0.4 - 1.2 mg/dL Final  ?07/13/2011 01:46 PM 1.2 0.4 - 1.2 mg/dL Final  ?07/06/2011 11:44 AM 1.4 (H) 0.4 - 1.2 mg/dL Final  ?12/22/2009 08:21 AM 1.2 0.4 - 1.2 mg/dL Final  ?01/20/2009 09:05 AM 1.2 0.4 - 1.2 mg/dL Final  ?08/26/2008 09:13 AM 1.20 0.4 - 1.2 mg/dL Final  ?01/27/2008 09:11 AM 1.1 0.4 - 1.2 mg/dL Final  ?10/02/2007 06:25 AM 1.18  Final  ?10/01/2007 07:15 AM 1.35 (H)  Final  ?09/30/2007 04:50 AM 2.02 (H)  Final  ?09/29/2007 03:39 PM 2.1 (H)  Final  ? ? ? ?PMHx: ?  ?Past Medical History:  ?Diagnosis Date  ? Allergic rhinitis   ? Anterior chest wall pain   ? Anxiety   ? Asthma   ? Chronic diastolic CHF (congestive heart failure) ([place])   ? Echo 01/2020: EF 60-65, normal wall motion, mild LVH, normal RV SF, RVSP 52.6 (moderate elevation), severe LAE, moderate RAE, trivial MR, mild MS (mean gradient 5.5 mmHg), mild aortic valve sclerosis (no AS); elevated E/e' c/w elevated LVEDP  ? Cough   ? DM type 2 (diabetes mellitus, type 2) ([place])   ? GERD (gastroesophageal reflux disease)   ? History of diverticulitis of colon   ? HTN (hypertension)   ? Hyperlipidemia   ? IBS (irritable bowel syndrome)   ? Iron deficiency anemia   ? Osteoporosis, unspecified   ? Renal insufficiency 07/31/2017  ? UTI  (urinary tract infection)   ? ? ?Past Surgical History:  ?Procedure Laterality Date  ? CARDIOVASCULAR STRESS TEST  02/25/04  ? ESOPHAGOGASTRODUODENOSCOPY  12/26/01  ? PACEMAKER IMPLANT N/A 08/02/2020  ? Procedure: PACEMAKER IMPLANT;  Surgeon: Deboraha Sprang, MD;  Location: [facility];  Service: Cardiovascular;  Laterality: N/A;  ? RIGHT OOPHORECTOMY  jan 2010  ? ? ?Family Hx:  ?Family History  ?Problem Relation Age of Onset  ? Lung cancer Brother   ? Melanoma Brother   ? Hypertension Mother   ? Stroke Mother   ? Other Father   ?     poor circulation  ? Cancer Sister   ? Diabetes Daughter   ? Atopy Neg Hx   ? Asthma Neg Hx   ? Breast cancer Neg Hx   ? ? ?Social History:  reports that she quit smoking about 35 years ago. Her smoking use included cigarettes. She has a 1.50 pack-year smoking history. She has never used smokeless tobacco. She reports that she does not drink alcohol and does not use drugs. ? ?Allergies:  ?Allergies  ?Allergen Reactions  ? Aspirin Shortness Of Breath and Other (See Comments)  ?  Caused asthma symptoms  ? Ramipril Cough  ? ? ?Medications: ?Prior to Admission medications   ?Medication Sig Start Date End Date Taking? Authorizing Provider  ?acetaminophen (TYLENOL) 325 MG tablet Take 650 mg by mouth every 6 (six) hours as needed for pain.   Yes [provider]  ?albuterol (PROAIR HFA) 108 (90 Base) MCG/ACT inhaler Inhale 2 puffs into the lungs every 4 (four) hours as needed for wheezing. 05/05/19 02/04/22 Yes Tanda Rockers, MD  ?amLODipine (NORVASC) 5 MG tablet TAKE 1 TABLET BY MOUTH TWICE A DAY ?Patient taking differently: Take 5 mg by mouth in the morning and at bedtime. 09/27/21  Yes Fay Records, MD  ?Ascorbic Acid (VITAMIN C) 1000 MG tablet Take 1,000 mg by mouth daily.   Yes [provider]  ?budesonide-formoterol (SYMBICORT) 160-4.5 MCG/ACT inhaler TAKE 2 PUFFS FIRST THING IN AM AND THEN ANOTHER 2 PUFFS ABOUT 12 HOURS LATER. ?Patient taking differently: Inhale 2  puffs into the lungs every 12 (twelve) hours. 07/13/21  Yes Tanda Rockers, MD  ?cetirizine (ZYRTEC ALLERGY) 10 MG tablet Take 1 tablet (10 mg total) by mouth daily as needed for allergies. 05/04/20  Yes Wert, Milly Jakob

## 2021-12-26 NOTE — Progress Notes (Signed)
? ?Progress Note ? ?Patient Name: [name] ?Date of Encounter: 12/26/2021 ? ?Primary Cardiologist: Dorris Carnes, MD  ? ? ? ?Patient Profile  ?   ?86 y.o. female with a history of complete heart block status post pacemaker, diabetes, permanent atrial fibrillation, chronic renal insufficiency and asthma admitted with chest pain; peak troponin 346 ?Echo 4/21 demonstrated normal LV function, moderate pulmonary hypertension, severe left atrial enlargement and mild mitral stenosis ?Echo 3/23 EF 30-35%  RVE  MS mean grad 6.5   (LVEF down from Fall 2022) ? ? ?Subjective  ? ?No CP  Breathing is OK at rest   Sl Nausea ?Inpatient Medications  ?  ?Scheduled Meds: ? amLODipine  5 mg Oral Daily  ? aspirin EC  81 mg Oral Daily  ? atorvastatin  80 mg Oral Daily  ? azithromycin  500 mg Oral Daily  ? fluticasone furoate-vilanterol  1 puff Inhalation Daily  ? hydrALAZINE  25 mg Oral TID  ? insulin aspart  0-5 Units Subcutaneous QHS  ? insulin aspart  0-9 Units Subcutaneous TID WC  ? latanoprost  1 drop Both Eyes QHS  ? montelukast  10 mg Oral QHS  ? pantoprazole (PROTONIX) IV  40 mg Intravenous Q12H  ? sodium chloride flush  3 mL Intravenous Q12H  ? ?Continuous Infusions: ? sodium chloride 10 mL/hr at 12/23/21 1053  ? ampicillin-sulbactam (UNASYN) IV 3 g (12/25/21 2016)  ? heparin 1,000 Units/hr (12/25/21 1115)  ? nitroGLYCERIN 15 mcg/min (12/25/21 0737)  ? ?PRN Meds: ?acetaminophen, albuterol, albuterol, metoCLOPramide (REGLAN) injection, nitroGLYCERIN, white petrolatum  ? ?Vital Signs  ?  ?Vitals:  ? 12/25/21 1956 12/26/21 0034 12/26/21 1062 12/26/21 6948  ?BP: 126/65 117/72 120/66   ?Pulse: 67 64 61 71  ?Resp: (!) 24 19 18 16   ?Temp: 98.1 ?F (36.7 ?C) 98.7 ?F (37.1 ?C) 97.7 ?F (36.5 ?C)   ?TempSrc: Oral Oral Oral   ?SpO2: 98% 100% 100% 97%  ?Weight:   80.9 kg   ?Height:      ? ? ?Intake/Output Summary (Last 24 hours) at 12/26/2021 0814 ?Last data filed at 12/26/2021 5462 ?Gross per 24 hour  ?Intake 240 ml  ?Output 925 ml  ?Net -685  ml  ? ?Net I/O  -350   ? ?Filed Weights  ? 12/23/21 0409 12/23/21 1025 12/26/21 0640  ?Weight: 82.1 kg 82.1 kg 80.9 kg  ? ? ?Telemetry  ?  ? Afib   Paced Reviewed ? ?Physical Exam  ?  ?Well developed and nourished in no acute distress ?HENT normal ?Neck supple  ?Lungs  CTA  ?Regular rate and rhythm, No definite murmurs  ?Abd-soft with active BS ?No Clubbing cyanosis edema ?Skin-warm and dry; turgor normal ?A & Oriented  Grossly normal sensory and motor function ? ?  ? ?Labs  ?  ?Chemistry ?Recent Labs  ?Lab 12/23/21 ?0426 12/24/21 ?0018 12/24/21 ?1506 12/25/21 ?0017 12/26/21 ?0139  ?NA 139   < > 138 136 136  ?K 4.0   < > 4.9 4.9 4.4  ?CL 108   < > 111 108 111  ?CO2 16*   < > 18* 19* 18*  ?GLUCOSE 191*   < > 146* 147* 153*  ?BUN 21   < > 34* 41* 54*  ?CREATININE 1.87*   < > 3.11* 3.46* 3.17*  ?CALCIUM 9.9   < > 8.9 8.9 8.5*  ?PROT 7.7  --   --  6.4* 6.0*  ?ALBUMIN 4.2  --   --  3.2* 2.9*  ?  AST 15  --   --  48* 33  ?ALT 9  --   --  19 20  ?ALKPHOS 68  --   --  61 65  ?BILITOT 0.7  --   --  0.9 1.6*  ?GFRNONAA 26*   < > 14* 13* 14*  ?ANIONGAP 15   < > 9 9 7   ? < > = values in this interval not displayed.  ?  ? ?Hematology ?Recent Labs  ?Lab 12/24/21 ?0018 12/25/21 ?0017 12/26/21 ?0139  ?WBC 11.7* 12.6* 12.0*  ?RBC 4.41 3.99 3.51*  ?HGB 12.0 10.8* 9.7*  ?HCT 34.9* 32.1* 27.9*  ?MCV 79.1* 80.5 79.5*  ?MCH 27.2 27.1 27.6  ?MCHC 34.4 33.6 34.8  ?RDW 15.8* 15.6* 15.3  ?PLT 150 136* 124*  ? ? ?Cardiac Enzymes troponin high-sensitivity 346 ? ?BNP ?Recent Labs  ?Lab 12/23/21 ?1030 12/26/21 ?0139  ?BNP 230.4* 1,327.6*  ?  ? ?DDimer No results for input(s): DDIMER in the last 168 hours.  ? ?Radiology  ?  ?CT ABDOMEN PELVIS WO CONTRAST ? ?Result Date: 12/24/2021 ?CLINICAL DATA:  Acute abdominal pain EXAM: CT ABDOMEN AND PELVIS WITHOUT CONTRAST TECHNIQUE: Multidetector CT imaging of the abdomen and pelvis was performed following the standard protocol without IV contrast. RADIATION DOSE REDUCTION: This exam was performed according  to the departmental dose-optimization program which includes automated exposure control, adjustment of the mA and/or kV according to patient size and/or use of iterative reconstruction technique. COMPARISON:  12/19/2019 FINDINGS: Lower chest: Small bilateral pleural effusions, right greater than left. Bibasilar areas of consolidation, with areas of right lower lobe airspace disease concerning for pneumonia. No pneumothorax. Dense calcification of the mitral and aortic valves. Single lead pacer. Cardiomegaly without pericardial effusion. Hepatobiliary: Multiple calcified gallstones without evidence of acute cholecystitis. Stable hypodensities within the right lobe liver measuring 1.41.3 cm respectively, likely small cysts or hemangiomas. No intrahepatic duct dilation. Pancreas: Unremarkable unenhanced appearance. Spleen: Unremarkable unenhanced appearance. Adrenals/Urinary Tract: Bilateral renal cortical thinning. No urinary tract calculi or obstructive uropathy. 2.3 cm peripelvic hypodensity right kidney image 29/3, new since prior study, compatible with a cyst within the attenuation of 9 Hounsfield units. No other focal parenchymal abnormalities. The adrenals and bladder are unremarkable. Stomach/Bowel: No bowel obstruction or ileus. Normal appendix right lower quadrant. No bowel wall thickening or inflammatory change. Vascular/Lymphatic: Aortic atherosclerosis. No enlarged abdominal or pelvic lymph nodes. Reproductive: Status post hysterectomy. No adnexal masses. Other: No free fluid or free intraperitoneal gas. No abdominal wall hernia. Musculoskeletal: No acute or destructive bony lesions. Reconstructed images demonstrate no additional findings. IMPRESSION: 1. Cholelithiasis without cholecystitis. 2. Right lower lobe airspace disease concerning for pneumonia. Other scattered areas of bilateral lower lobe consolidation favor atelectasis. 3. Small bilateral pleural effusions, right greater than left. 4.  Cardiomegaly. 5.  Aortic Atherosclerosis (ICD10-I70.0). Electronically Signed   By: Randa Ngo M.D.   On: 12/24/2021 15:38  ? ?DG Chest 1 View ? ?Result Date: 12/24/2021 ?CLINICAL DATA:  Follow-up study. EXAM: CHEST  1 VIEW COMPARISON:  12/23/2021 and older exams.  Chest CT, 12/30/2019. FINDINGS: Moderate enlargement of the cardiopericardial silhouette. No convincing mediastinal or hilar masses. Hazy opacity in the right lung becoming more confluent at the base where the right hemidiaphragm is silhouetted, increased when compared to the prior exam. Milder opacity noted in the medial left lung base. Small bilateral pleural effusions, right suspected to be increased compared to the prior radiograph. No pneumothorax. Stable left anterior chest wall single lead pacemaker. IMPRESSION: 1.  Interval worsening in right lung aeration with an increase in hazy opacity in the right lung and more confluent opacity at the right lung base. Suspect combination of an enlarged right pleural effusion with dependent atelectasis and/or asymmetric edema. Electronically Signed   By: Lajean Manes M.D.   On: 12/24/2021 15:36  ? ?NM Hepatobiliary Liver Func ? ?Result Date: 12/25/2021 ?CLINICAL DATA:  Abdominal pain and nausea for 4 days. Vomiting. Cholelithiasis. EXAM: NUCLEAR MEDICINE HEPATOBILIARY IMAGING TECHNIQUE: Sequential images of the abdomen were obtained out to 60 minutes following intravenous administration of radiopharmaceutical. RADIOPHARMACEUTICALS:  5.5 mCi Tc-57m  Choletec IV COMPARISON:  None. FINDINGS: Prompt uptake and biliary excretion of activity by the liver is seen. Gallbladder activity is visualized, consistent with patency of cystic duct. Biliary activity passes into small bowel, consistent with patent common bile duct. IMPRESSION: Normal hepatobiliary scan, demonstrating patency of both the cystic and common bile ducts. Electronically Signed   By: Marlaine Hind M.D.   On: 12/25/2021 15:11  ? ?US RENAL ? ?Result  Date: 12/25/2021 ?CLINICAL DATA:  Acute kidney injury. EXAM: RENAL / URINARY TRACT ULTRASOUND COMPLETE COMPARISON:  None. FINDINGS: Right Kidney: Renal measurements: 10.8 cm x 5.3 cm x 4.9 cm = volume: 144.5 mL. Dif

## 2021-12-26 NOTE — Care Management Important Message (Signed)
Important Message ? ?Patient Details  ?Name: [name] ?MRN: [id number] ?Date of Birth: 1936-07-07 ? ? ?Medicare Important Message Given:  Yes ? ? ? ? ?Manolo Bosket ?12/26/2021, 2:02 PM ?

## 2021-12-26 NOTE — Progress Notes (Signed)
?                                  PROGRESS NOTE                                             ?                                                                                                                     ?                                         ? ? Patient Demographics:  ? ? [name], is a 86 y.o. female, DOB - December 26, 1935, XNA:[id number] ? ?Outpatient Primary MD for the patient is [name], FNP    LOS - 3  Admit date - 12/23/2021   ? ?Chief Complaint  ?Patient presents with  ? Chest Pain  ?    ? ?Brief Narrative (HPI from H&P) 86 y.o. 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  Was admitted by cardiology service placed on heparin drip for NSTEMI and hospitalist team was consulted for nausea evaluation ? ? Subjective:  ? ?Patient in bed, appears comfortable, denies any headache, no fever, no chest pain or pressure, no shortness of breath , no abdominal pain, no nausea today. No new focal weakness. Looks and feels a lot better after IVF on 12/25/21. ? ? Assessment  & Plan :  ? ?Chest pain with NSTEMI acute on chronic CHF decompensation with EF 30% and regional wall motion abnormalities on echocardiogram.  Defer management of this issue to primary team which is cardiology, currently on heparin and nitroglycerin drip, also on aspirin and statin, low-dose beta-blocker continued, defer further management to cardiology team, was requested to address fluids on her by Dr Caryl Comes, gentle IVF for now due to #6. ? ?2.  Persistent nausea with some right upper quadrant pain and tenderness on exam.  CT scan revealed gallstones, negative HIDA, seen by GI team as well, better with PPI and  Zofran, RUQ pain resolved,  Note nausea could be due to NSTEMI as well. ? ?3.  Possible pneumonia.  Mild cough, much better with IV ABX continue total 5 days. ? ?4.  Paroxysmal atrial fibrillation.  Mali vas 2 score of greater than 3.  Currently on heparin drip instead of home Eliquis, low-dose beta-blocker.  Defer management of this issue to cardiology  which is the primary team. ? ?5.  COPD.  Stable.  Supportive care. ? ?6.  AKI on CKD 3B.  Baseline creatinine around 1.8.  Currently appears to be dehydrated, oral intake has been poor due to intractable nausea for several days, hold further diuresis, better with IVF,continue gently another 15 hrs. ? ?7.  DM type II.  Recent A1c 5.4.  Sliding scale for now ? ?CBG (last 3)  ?Recent Labs  ?  12/25/21 ?2108 12/26/21 ?0639 12/26/21 ?0829  ?GLUCAP 180* 153* 152*  ? ? ? ?   ? ?Condition - Extremely Guarded ? ?Family Communication  : Daughter bedside on 12/25/2021, 12/26/21 ? ?Code Status :  Full ? ?Consults  : [place] consulting, GI consulting, cardiology primary ? ?PUD Prophylaxis : PPI ? ? Procedures  :    ? ?HIDA - Non acute ? ?CT - 1. Cholelithiasis without cholecystitis. 2. Right lower lobe airspace disease concerning for pneumonia. Other scattered areas of bilateral lower lobe consolidation favor atelectasis. 3. Small bilateral pleural effusions, right greater than left. 4. Cardiomegaly. 5.  Aortic Atherosclerosis  ? ?TTE - 1. Left ventricular ejection fraction, by estimation, is 30 to 35%. The left ventricle has moderately decreased function. The left ventricle demonstrates regional wall motion abnormalities (see scoring diagram/findings for description). There is mild concentric left ventricular hypertrophy. Indeterminate diastolic filling due to E-A fusion. Elevated left ventricular end-diastolic pressure.  2. Right ventricular systolic function is mildly reduced. The right ventricular size is moderately enlarged. There is moderately elevated pulmonary artery systolic  pressure.  3. Left atrial size was severely dilated.  4. Right atrial size was moderately dilated.  5. The mitral valve is abnormal. Trivial mitral valve regurgitation. Moderate mitral stenosis. The mean mitral valve gradient is 6.5 mmHg.  6. Tricuspid valve regurgitation is moderate.  7. The aortic valve is tricuspid. There is mild calcification of the aortic valve. There is mild thickening of the aortic valve. Aortic valve regurgitation is trivial. Aortic valve sclerosis is present, with no evidence of aortic valve stenosis.  8. The inferior vena cava is dilated in size with >50% respiratory variability, suggesting right atrial pressure of 8 mmHg. Comparison(s): Prior images reviewed side by side. Changes from prior study are noted. Conclusion(s)/Recommendation(s): Reduced LVEF with new wall motion abnormalities. No LV thrombus. Elevated LVEDP. Findings communicated to Dr. Acie Fredrickson. ? ?   ? ?Disposition Plan  :   ? ?Status is: Inpatient ? ? ?DVT Prophylaxis  :  Hep gtt ? ?Lab Results  ?Component Value Date  ? PLT 124 (L) 12/26/2021  ? ? ?Diet :  ?Diet Order   ? ?       ?  Diet Heart Room service appropriate? Yes; Fluid consistency: Thin  Diet effective now       ?  ? ?  ?  ? ?  ?  ? ?Inpatient Medications ? ?Scheduled Meds: ? amLODipine  5 mg Oral Daily  ? aspirin EC  81 mg Oral Daily  ? atorvastatin  80 mg Oral Daily  ? azithromycin  500 mg Oral Daily  ? fluticasone furoate-vilanterol  1 puff Inhalation Daily  ? hydrALAZINE  25 mg Oral TID  ? insulin aspart  0-5 Units Subcutaneous QHS  ? insulin aspart  0-9 Units Subcutaneous TID WC  ? latanoprost  1 drop Both Eyes QHS  ? montelukast  10 mg Oral QHS  ? pantoprazole (PROTONIX) IV  40 mg Intravenous Q12H  ? sodium chloride flush  3 mL  Intravenous Q12H  ? ?Continuous Infusions: ? sodium chloride 10 mL/hr at 12/23/21 1053  ? ampicillin-sulbactam (UNASYN) IV 3 g (12/26/21 0841)  ? heparin 1,000 Units/hr (12/25/21 1115)  ? lactated ringers    ? nitroGLYCERIN 15 mcg/min  (12/25/21 0814)  ? ?PRN Meds:.acetaminophen, albuterol, albuterol, metoCLOPramide (REGLAN) injection, nitroGLYCERIN, white petrolatum ? ?Antibiotics  :   ? ?Anti-infectives (From admission, onward)  ? ? Start     Dose/Rate Route Frequency Ordered Stop  ? 12/25/21 2000  Ampicillin-Sulbactam (UNASYN) 3 g in sodium chloride 0.9 % 100 mL IVPB       ? 3 g ?200 mL/hr over 30 Minutes Intravenous Every 12 hours 12/25/21 1902    ? 12/24/21 1715  cefTRIAXone (ROCEPHIN) 1 g in sodium chloride 0.9 % 100 mL IVPB  Status:  Discontinued       ? 1 g ?200 mL/hr over 30 Minutes Intravenous Every 24 hours 12/24/21 1616 12/25/21 1902  ? 12/24/21 1715  azithromycin (ZITHROMAX) tablet 500 mg       ? 500 mg Oral Daily 12/24/21 1616    ? ?  ? ? ? Time Spent in minutes  30 ? ? ?Lala Lund M.D on 12/26/2021 at 8:50 AM ? ?To page go to [email or web address]  ? ?[facility] -  Office  [phone] ? ?See all Orders from today for further details ? ? ? Objective:  ? ?Vitals:  ? 12/26/21 0034 12/26/21 1761 12/26/21 6073 12/26/21 0816  ?BP: 117/72 120/66  133/70  ?Pulse: 64 61 71 68  ?Resp: 19 18 16 20   ?Temp: 98.7 ?F (37.1 ?C) 97.7 ?F (36.5 ?C)  98 ?F (36.7 ?C)  ?TempSrc: Oral Oral  Oral  ?SpO2: 100% 100% 97% 100%  ?Weight:  80.9 kg    ?Height:      ? ? ?Wt Readings from Last 3 Encounters:  ?12/26/21 80.9 kg  ?12/12/21 82.5 kg  ?11/04/21 83.1 kg  ? ? ? ?Intake/Output Summary (Last 24 hours) at 12/26/2021 0850 ?Last data filed at 12/26/2021 7106 ?Gross per 24 hour  ?Intake 240 ml  ?Output 925 ml  ?Net -685 ml  ? ? ? ?Physical Exam ? ?Awake Alert, No new F.N deficits, Normal affect ?[place].AT,PERRAL ?Supple Neck, No JVD,   ?Symmetrical Chest wall movement, Good air movement bilaterally, CTAB ?RRR,No Gallops, Rubs or new Murmurs,  ?+ve B.Sounds, Abd Soft, No tenderness,   ?No Cyanosis, Clubbing or edema  ? ?  ? ? Data Review:  ? ? ?CBC ?Recent Labs  ?Lab 12/23/21 ?0426 12/24/21 ?0018 12/25/21 ?0017 12/26/21 ?0139  ?WBC 7.0 11.7* 12.6* 12.0*  ?HGB 12.0  12.0 10.8* 9.7*  ?HCT 35.5* 34.9* 32.1* 27.9*  ?PLT 184 150 136* 124*  ?MCV 77.3* 79.1* 80.5 79.5*  ?MCH 26.1 27.2 27.1 27.6  ?MCHC 33.8 34.4 33.6 34.8  ?RDW 16.2* 15.8* 15.6* 15.3  ? ? ?Electrolytes ?R

## 2021-12-26 NOTE — Progress Notes (Signed)
ANTICOAGULATION CONSULT NOTE ? ?Pharmacy Consult for Heparin ?Indication: chest pain/ACS ? ?Allergies  ?Allergen Reactions  ? Aspirin Shortness Of Breath and Other (See Comments)  ?  Caused asthma symptoms  ? Ramipril Cough  ? ? ?Patient Measurements: ?Height: 5\' 7"  (170.2 cm) ?Weight: 80.9 kg (178 lb 5.6 oz) ?IBW/kg (Calculated) : 61.6 ?Heparin Dosing Weight: 75 kg ? ?Vital Signs: ?Temp: 98 ?F (36.7 ?C) (03/27 3810) ?Temp Source: Oral (03/27 1751) ?BP: 133/70 (03/27 0816) ?Pulse Rate: 68 (03/27 0816) ? ?Labs: ?Recent Labs  ?  12/24/21 ?0018 12/24/21 ?1506 12/25/21 ?0017 12/26/21 ?0139  ?HGB 12.0  --  10.8* 9.7*  ?HCT 34.9*  --  32.1* 27.9*  ?PLT 150  --  136* 124*  ?APTT 82*  --  72* 62*  ?HEPARINUNFRC 0.96*  --  0.80* 0.72*  ?CREATININE 2.45* 3.11* 3.46* 3.17*  ? ? ? ?Estimated Creatinine Clearance: 14.7 mL/min (A) (by C-G formula based on SCr of 3.17 mg/dL (H)). ? ?  ? ?Assessment: ?86 y.o. female with chest pain to continue on IV heparin.  Patient was on Eliquis PTA for history of Afib, so will use aPTT to guide heparin dosing until aPTT and heparin level correlate.  Last dose of Eliquis was on 3/23 at 1200. ? ?aPTT now just below goal at 62 sec; heparin level elevated as expected from recent Eliquis.  No issues reported. Hemoglobin trending down ? ?Goal of Therapy:  ?Heparin level 0.3-0.7 units/ml ?Monitor platelets by anticoagulation protocol: Yes ?  ?Plan:  ?Increase heparin infusion to 1050 units/hr ?Daily aPTT, heparin level and CBC. ?F/u plans to resume Eliquis eventually. ? ?Erin Hearing PharmD., BCPS ?Clinical Pharmacist ?12/26/2021 8:59 AM ? ?

## 2021-12-26 NOTE — Progress Notes (Incomplete)
Subjective: ?Since I last evaluated the patient *** ? ?Objective: ?Vital signs in last 24 hours: ?Temp:  [97.7 ?F (36.5 ?C)-98.7 ?F (37.1 ?C)] 98 ?F (36.7 ?C) (03/27 1062) ?Pulse Rate:  [61-74] 68 (03/27 0816) ?Resp:  [16-27] 20 (03/27 0816) ?BP: (111-133)/(57-72) 133/70 (03/27 0816) ?SpO2:  [97 %-100 %] 100 % (03/27 0816) ?Weight:  [80.9 kg] 80.9 kg (03/27 0640) ?  ? ?Intake/Output from previous day: ?03/26 0701 - 03/27 0700 ?In: 240 [P.O.:240] ?Out: 925 [Urine:925] ?Intake/Output this shift: ?No intake/output data recorded. ? ?{IR:[id number]} ? ?Lab Results: ?Recent Labs  ?  12/24/21 ?0018 12/25/21 ?0017 12/26/21 ?0139  ?WBC 11.7* 12.6* 12.0*  ?HGB 12.0 10.8* 9.7*  ?HCT 34.9* 32.1* 27.9*  ?PLT 150 136* 124*  ? ?BMET ?Recent Labs  ?  12/24/21 ?1506 12/25/21 ?0017 12/26/21 ?0139  ?NA 138 136 136  ?K 4.9 4.9 4.4  ?CL 111 108 111  ?CO2 18* 19* 18*  ?GLUCOSE 146* 147* 153*  ?BUN 34* 41* 54*  ?CREATININE 3.11* 3.46* 3.17*  ?CALCIUM 8.9 8.9 8.5*  ? ?LFT ?Recent Labs  ?  12/26/21 ?0139  ?PROT 6.0*  ?ALBUMIN 2.9*  ?AST 33  ?ALT 20  ?ALKPHOS 65  ?BILITOT 1.6*  ? ?PT/INR ?No results for input(s): LABPROT, INR in the last 72 hours. ?Hepatitis Panel ?No results for input(s): HEPBSAG, HCVAB, HEPAIGM, HEPBIGM in the last 72 hours. ?C-Diff ?No results for input(s): CDIFFTOX in the last 72 hours. ?No results for input(s): CDIFFPCR in the last 72 hours. ?Fecal Lactopherrin ?No results for input(s): FECLLACTOFRN in the last 72 hours. ? ?Studies/Results: ?CT ABDOMEN PELVIS WO CONTRAST ? ?Result Date: 12/24/2021 ?CLINICAL DATA:  Acute abdominal pain EXAM: CT ABDOMEN AND PELVIS WITHOUT CONTRAST TECHNIQUE: Multidetector CT imaging of the abdomen and pelvis was performed following the standard protocol without IV contrast. RADIATION DOSE REDUCTION: This exam was performed according to the departmental dose-optimization program which includes automated exposure control, adjustment of the mA and/or kV according to patient size and/or use of  iterative reconstruction technique. COMPARISON:  12/19/2019 FINDINGS: Lower chest: Small bilateral pleural effusions, right greater than left. Bibasilar areas of consolidation, with areas of right lower lobe airspace disease concerning for pneumonia. No pneumothorax. Dense calcification of the mitral and aortic valves. Single lead pacer. Cardiomegaly without pericardial effusion. Hepatobiliary: Multiple calcified gallstones without evidence of acute cholecystitis. Stable hypodensities within the right lobe liver measuring 1.41.3 cm respectively, likely small cysts or hemangiomas. No intrahepatic duct dilation. Pancreas: Unremarkable unenhanced appearance. Spleen: Unremarkable unenhanced appearance. Adrenals/Urinary Tract: Bilateral renal cortical thinning. No urinary tract calculi or obstructive uropathy. 2.3 cm peripelvic hypodensity right kidney image 29/3, new since prior study, compatible with a cyst within the attenuation of 9 Hounsfield units. No other focal parenchymal abnormalities. The adrenals and bladder are unremarkable. Stomach/Bowel: No bowel obstruction or ileus. Normal appendix right lower quadrant. No bowel wall thickening or inflammatory change. Vascular/Lymphatic: Aortic atherosclerosis. No enlarged abdominal or pelvic lymph nodes. Reproductive: Status post hysterectomy. No adnexal masses. Other: No free fluid or free intraperitoneal gas. No abdominal wall hernia. Musculoskeletal: No acute or destructive bony lesions. Reconstructed images demonstrate no additional findings. IMPRESSION: 1. Cholelithiasis without cholecystitis. 2. Right lower lobe airspace disease concerning for pneumonia. Other scattered areas of bilateral lower lobe consolidation favor atelectasis. 3. Small bilateral pleural effusions, right greater than left. 4. Cardiomegaly. 5.  Aortic Atherosclerosis (ICD10-I70.0). Electronically Signed   By: Randa Ngo M.D.   On: 12/24/2021 15:38  ? ?DG Chest 1 View ? ?Result  Date:  12/24/2021 ?CLINICAL DATA:  Follow-up study. EXAM: CHEST  1 VIEW COMPARISON:  12/23/2021 and older exams.  Chest CT, 12/30/2019. FINDINGS: Moderate enlargement of the cardiopericardial silhouette. No convincing mediastinal or hilar masses. Hazy opacity in the right lung becoming more confluent at the base where the right hemidiaphragm is silhouetted, increased when compared to the prior exam. Milder opacity noted in the medial left lung base. Small bilateral pleural effusions, right suspected to be increased compared to the prior radiograph. No pneumothorax. Stable left anterior chest wall single lead pacemaker. IMPRESSION: 1. Interval worsening in right lung aeration with an increase in hazy opacity in the right lung and more confluent opacity at the right lung base. Suspect combination of an enlarged right pleural effusion with dependent atelectasis and/or asymmetric edema. Electronically Signed   By: Lajean Manes M.D.   On: 12/24/2021 15:36  ? ?NM Hepatobiliary Liver Func ? ?Result Date: 12/25/2021 ?CLINICAL DATA:  Abdominal pain and nausea for 4 days. Vomiting. Cholelithiasis. EXAM: NUCLEAR MEDICINE HEPATOBILIARY IMAGING TECHNIQUE: Sequential images of the abdomen were obtained out to 60 minutes following intravenous administration of radiopharmaceutical. RADIOPHARMACEUTICALS:  5.5 mCi Tc-1m  Choletec IV COMPARISON:  None. FINDINGS: Prompt uptake and biliary excretion of activity by the liver is seen. Gallbladder activity is visualized, consistent with patency of cystic duct. Biliary activity passes into small bowel, consistent with patent common bile duct. IMPRESSION: Normal hepatobiliary scan, demonstrating patency of both the cystic and common bile ducts. Electronically Signed   By: Marlaine Hind M.D.   On: 12/25/2021 15:11  ? ?US RENAL ? ?Result Date: 12/25/2021 ?CLINICAL DATA:  Acute kidney injury. EXAM: RENAL / URINARY TRACT ULTRASOUND COMPLETE COMPARISON:  None. FINDINGS: Right Kidney: Renal measurements:  10.8 cm x 5.3 cm x 4.9 cm = volume: 144.5 mL. Diffusely increased echogenicity of the renal parenchyma is noted. 0.6 cm x 0.4 cm x 0.7 cm and 3.0 cm x 2.4 cm x 2.9 cm anechoic structures are seen within the right kidney. No abnormal flow is noted within these regions on color Doppler evaluation. No hydronephrosis is visualized. Left Kidney: Renal measurements: 12.5 cm x 4.6 cm x 4.3 cm = volume: 131.5 mL. Diffusely increased echogenicity of the renal parenchyma is noted. A 0.6 cm x 0.7 cm x 0.5 cm anechoic structure is seen within the left kidney. No abnormal flow is noted within this area on color Doppler evaluation. No hydronephrosis is visualized. Bladder: Appears normal for degree of bladder distention. Other: None. IMPRESSION: 1. Bilateral echogenic kidneys, likely secondary to medical renal disease. 2. Bilateral simple renal cysts. Electronically Signed   By: Virgina Norfolk M.D.   On: 12/25/2021 17:02  ? ?DG Chest Port 1 View ? ?Result Date: 12/26/2021 ?CLINICAL DATA:  Shortness of breath EXAM: PORTABLE CHEST 1 VIEW COMPARISON:  12/25/2021 FINDINGS: Cardiomegaly with congested appearance of vessels. Small pleural effusions are possible. Stable aortic and hilar contours. Single chamber pacer into the right ventricle. IMPRESSION: Cardiomegaly and vascular congestion. Electronically Signed   By: Jorje Guild M.D.   On: 12/26/2021 06:26  ? ?DG Chest Port 1 View ? ?Result Date: 12/25/2021 ?CLINICAL DATA:  Shortness of breath EXAM: PORTABLE CHEST 1 VIEW COMPARISON:  12/24/2021 FINDINGS: Cardiomegaly and vascular pedicle widening. Congested appearance of vessels with interstitial coarsening. Small volume pleural effusions. Improved aeration at the right base. No pneumothorax. Single chamber pacer into the right ventricle. IMPRESSION: Cardiomegaly and vascular congestion. Right lower lobe aeration has improved. Electronically Signed   By: Roderic Palau  Watts M.D.   On: 12/25/2021 07:56   ? ?Medications: {medication  reviewed/display:[id number]} ? ?Assessment/Plan: ?*** ? LOS: 3 days  ? ?[name] ?12/26/2021, 12:04 PM ? ? ?

## 2021-12-27 DIAGNOSIS — I214 Non-ST elevation (NSTEMI) myocardial infarction: Secondary | ICD-10-CM | POA: Diagnosis not present

## 2021-12-27 DIAGNOSIS — I5021 Acute systolic (congestive) heart failure: Secondary | ICD-10-CM | POA: Diagnosis not present

## 2021-12-27 DIAGNOSIS — N179 Acute kidney failure, unspecified: Secondary | ICD-10-CM | POA: Diagnosis not present

## 2021-12-27 DIAGNOSIS — N184 Chronic kidney disease, stage 4 (severe): Secondary | ICD-10-CM | POA: Diagnosis not present

## 2021-12-27 LAB — COMPREHENSIVE METABOLIC PANEL
ALT: 28 U/L (ref 0–44)
AST: 36 U/L (ref 15–41)
Albumin: 3.2 g/dL — ABNORMAL LOW (ref 3.5–5.0)
Alkaline Phosphatase: 85 U/L (ref 38–126)
Anion gap: 11 (ref 5–15)
BUN: 59 mg/dL — ABNORMAL HIGH (ref 8–23)
CO2: 18 mmol/L — ABNORMAL LOW (ref 22–32)
Calcium: 9 mg/dL (ref 8.9–10.3)
Chloride: 108 mmol/L (ref 98–111)
Creatinine, Ser: 3.21 mg/dL — ABNORMAL HIGH (ref 0.44–1.00)
GFR, Estimated: 14 mL/min — ABNORMAL LOW (ref >60)
Glucose, Bld: 180 mg/dL — ABNORMAL HIGH (ref 70–99)
Potassium: 4.5 mmol/L (ref 3.5–5.1)
Sodium: 137 mmol/L (ref 135–145)
Total Bilirubin: 1.7 mg/dL — ABNORMAL HIGH (ref 0.3–1.2)
Total Protein: 6.5 g/dL (ref 6.5–8.1)

## 2021-12-27 LAB — IRON AND TIBC
Iron: 44 ug/dL (ref 28–170)
Saturation Ratios: 17 % (ref 10.4–31.8)
TIBC: 263 ug/dL (ref 250–450)
UIBC: 219 ug/dL

## 2021-12-27 LAB — PROCALCITONIN: Procalcitonin: 0.73 ng/mL

## 2021-12-27 LAB — GLUCOSE, CAPILLARY
Glucose-Capillary: 160 mg/dL — ABNORMAL HIGH (ref 70–99)
Glucose-Capillary: 175 mg/dL — ABNORMAL HIGH (ref 70–99)
Glucose-Capillary: 153 mg/dL — ABNORMAL HIGH (ref 70–99)
Glucose-Capillary: 176 mg/dL — ABNORMAL HIGH (ref 70–99)

## 2021-12-27 LAB — CBC
HCT: 28.2 % — ABNORMAL LOW (ref 36.0–46.0)
Hemoglobin: 9.7 g/dL — ABNORMAL LOW (ref 12.0–15.0)
MCH: 27.4 pg (ref 26.0–34.0)
MCHC: 34.4 g/dL (ref 30.0–36.0)
MCV: 79.7 fL — ABNORMAL LOW (ref 80.0–100.0)
Platelets: 147 10*3/uL — ABNORMAL LOW (ref 150–400)
RBC: 3.54 MIL/uL — ABNORMAL LOW (ref 3.87–5.11)
RDW: 15.3 % (ref 11.5–15.5)
WBC: 11.5 10*3/uL — ABNORMAL HIGH (ref 4.0–10.5)
nRBC: 4.5 % — ABNORMAL HIGH (ref 0.0–0.2)

## 2021-12-27 LAB — TROPONIN I (HIGH SENSITIVITY)
Troponin I (High Sensitivity): 2797 ng/L (ref <18)
Troponin I (High Sensitivity): 2716 ng/L (ref <18)

## 2021-12-27 LAB — BRAIN NATRIURETIC PEPTIDE: B Natriuretic Peptide: 1711.2 pg/mL — ABNORMAL HIGH (ref 0.0–100.0)

## 2021-12-27 LAB — MAGNESIUM: Magnesium: 2.2 mg/dL (ref 1.7–2.4)

## 2021-12-27 LAB — APTT: aPTT: 57 seconds — ABNORMAL HIGH (ref 24–36)

## 2021-12-27 LAB — HEPARIN LEVEL (UNFRACTIONATED): Heparin Unfractionated: 0.68 IU/mL (ref 0.30–0.70)

## 2021-12-27 LAB — FERRITIN: Ferritin: 81 ng/mL (ref 11–307)

## 2021-12-27 MED ORDER — ONDANSETRON HCL 4 MG/2ML IJ SOLN
4.0000 mg | Freq: Four times a day (QID) | INTRAMUSCULAR | Status: DC
  Administered 2021-12-27 – 2022-01-04 (×24): 4 mg via INTRAVENOUS
  Filled 2021-12-27 (×26): qty 2

## 2021-12-27 MED ORDER — FUROSEMIDE 10 MG/ML IJ SOLN
40.0000 mg | Freq: Two times a day (BID) | INTRAMUSCULAR | Status: AC
  Administered 2021-12-27 (×2): 40 mg via INTRAVENOUS
  Filled 2021-12-27 (×2): qty 4

## 2021-12-27 MED ORDER — TRAZODONE HCL 50 MG PO TABS
50.0000 mg | ORAL_TABLET | ORAL | Status: AC
  Administered 2021-12-27: 50 mg via ORAL
  Filled 2021-12-27: qty 1

## 2021-12-27 NOTE — Progress Notes (Signed)
Subjective: ?Still with nausea.  It waxes and wanes. ? ?Objective: ?Vital signs in last 24 hours: ?Temp:  [97.7 ?F (36.5 ?C)-98.4 ?F (36.9 ?C)] 98.4 ?F (36.9 ?C) (03/28 0018) ?Pulse Rate:  [60-71] 60 (03/28 0018) ?Resp:  [16-25] 25 (03/28 0018) ?BP: (114-133)/(46-77) 114/46 (03/28 0018) ?SpO2:  [93 %-100 %] 93 % (03/28 0018) ?Weight:  [80.9 kg] 80.9 kg (03/27 0640) ?  ? ?Intake/Output from previous day: ?03/27 0701 - 03/28 0700 ?In: 300 [P.O.:300] ?Out: 100 [Urine:100] ?Intake/Output this shift: ?Total I/O ?In: -  ?Out: 100 [Urine:100] ? ?General appearance: alert and no distress ?Resp: end-expiratory wheezing ?Cardio: regular rate and rhythm, S1, S2 normal, no murmur, click, rub or gallop ?GI: soft, non-tender; bowel sounds normal; no masses,  no organomegaly ?Extremities: extremities normal, atraumatic, no cyanosis or edema ? ?Lab Results: ?Recent Labs  ?  12/25/21 ?0017 12/26/21 ?0139 12/27/21 ?7371  ?WBC 12.6* 12.0* 11.5*  ?HGB 10.8* 9.7* 9.7*  ?HCT 32.1* 27.9* 28.2*  ?PLT 136* 124* 147*  ? ?BMET ?Recent Labs  ?  12/25/21 ?0017 12/26/21 ?0139 12/27/21 ?0626  ?NA 136 136 137  ?K 4.9 4.4 4.5  ?CL 108 111 108  ?CO2 19* 18* 18*  ?GLUCOSE 147* 153* 180*  ?BUN 41* 54* 59*  ?CREATININE 3.46* 3.17* 3.21*  ?CALCIUM 8.9 8.5* 9.0  ? ?LFT ?Recent Labs  ?  12/27/21 ?9485  ?PROT 6.5  ?ALBUMIN 3.2*  ?AST 36  ?ALT 28  ?ALKPHOS 85  ?BILITOT 1.7*  ? ?PT/INR ?No results for input(s): LABPROT, INR in the last 72 hours. ?Hepatitis Panel ?No results for input(s): HEPBSAG, HCVAB, HEPAIGM, HEPBIGM in the last 72 hours. ?C-Diff ?No results for input(s): CDIFFTOX in the last 72 hours. ?Fecal Lactopherrin ?No results for input(s): FECLLACTOFRN in the last 72 hours. ? ?Studies/Results: ?NM Hepatobiliary Liver Func ? ?Result Date: 12/25/2021 ?CLINICAL DATA:  Abdominal pain and nausea for 4 days. Vomiting. Cholelithiasis. EXAM: NUCLEAR MEDICINE HEPATOBILIARY IMAGING TECHNIQUE: Sequential images of the abdomen were obtained out to 60 minutes  following intravenous administration of radiopharmaceutical. RADIOPHARMACEUTICALS:  5.5 mCi Tc-62m  Choletec IV COMPARISON:  None. FINDINGS: Prompt uptake and biliary excretion of activity by the liver is seen. Gallbladder activity is visualized, consistent with patency of cystic duct. Biliary activity passes into small bowel, consistent with patent common bile duct. IMPRESSION: Normal hepatobiliary scan, demonstrating patency of both the cystic and common bile ducts. Electronically Signed   By: Marlaine Hind M.D.   On: 12/25/2021 15:11  ? ?US RENAL ? ?Result Date: 12/25/2021 ?CLINICAL DATA:  Acute kidney injury. EXAM: RENAL / URINARY TRACT ULTRASOUND COMPLETE COMPARISON:  None. FINDINGS: Right Kidney: Renal measurements: 10.8 cm x 5.3 cm x 4.9 cm = volume: 144.5 mL. Diffusely increased echogenicity of the renal parenchyma is noted. 0.6 cm x 0.4 cm x 0.7 cm and 3.0 cm x 2.4 cm x 2.9 cm anechoic structures are seen within the right kidney. No abnormal flow is noted within these regions on color Doppler evaluation. No hydronephrosis is visualized. Left Kidney: Renal measurements: 12.5 cm x 4.6 cm x 4.3 cm = volume: 131.5 mL. Diffusely increased echogenicity of the renal parenchyma is noted. A 0.6 cm x 0.7 cm x 0.5 cm anechoic structure is seen within the left kidney. No abnormal flow is noted within this area on color Doppler evaluation. No hydronephrosis is visualized. Bladder: Appears normal for degree of bladder distention. Other: None. IMPRESSION: 1. Bilateral echogenic kidneys, likely secondary to medical renal disease. 2. Bilateral simple renal  cysts. Electronically Signed   By: Virgina Norfolk M.D.   On: 12/25/2021 17:02  ? ?DG Chest Port 1 View ? ?Result Date: 12/26/2021 ?CLINICAL DATA:  Shortness of breath EXAM: PORTABLE CHEST 1 VIEW COMPARISON:  12/25/2021 FINDINGS: Cardiomegaly with congested appearance of vessels. Small pleural effusions are possible. Stable aortic and hilar contours. Single chamber pacer  into the right ventricle. IMPRESSION: Cardiomegaly and vascular congestion. Electronically Signed   By: Jorje Guild M.D.   On: 12/26/2021 06:26  ? ?DG Chest Port 1 View ? ?Result Date: 12/25/2021 ?CLINICAL DATA:  Shortness of breath EXAM: PORTABLE CHEST 1 VIEW COMPARISON:  12/24/2021 FINDINGS: Cardiomegaly and vascular pedicle widening. Congested appearance of vessels with interstitial coarsening. Small volume pleural effusions. Improved aeration at the right base. No pneumothorax. Single chamber pacer into the right ventricle. IMPRESSION: Cardiomegaly and vascular congestion. Right lower lobe aeration has improved. Electronically Signed   By: Jorje Guild M.D.   On: 12/25/2021 07:56   ? ?Medications: Scheduled: ? amLODipine  5 mg Oral Daily  ? aspirin EC  81 mg Oral Daily  ? atorvastatin  80 mg Oral Daily  ? azithromycin  500 mg Oral Daily  ? fluticasone furoate-vilanterol  1 puff Inhalation Daily  ? hydrALAZINE  25 mg Oral TID  ? insulin aspart  0-5 Units Subcutaneous QHS  ? insulin aspart  0-9 Units Subcutaneous TID WC  ? latanoprost  1 drop Both Eyes QHS  ? montelukast  10 mg Oral QHS  ? pantoprazole (PROTONIX) IV  40 mg Intravenous Q12H  ? sodium chloride flush  3 mL Intravenous Q12H  ? ?Continuous: ? sodium chloride 10 mL/hr at 12/23/21 1053  ? ampicillin-sulbactam (UNASYN) IV 3 g (12/26/21 2001)  ? heparin 1,000 Units/hr (12/26/21 1228)  ? nitroGLYCERIN 15 mcg/min (12/26/21 1225)  ? ? ?Assessment/Plan: ?1) Nausea. ?2) Cholelithiasis. ?3) NSTEMI. ?4) CHF. ?5) Renal insufficiency. ?6) Asthma. ? ? The patient's nausea is most likely multifactorial.  She does have CHF and renal insufficiency and she is s/p NSTEMI.  Improvement/optimization of these issues may resolve or improve her nausea complaints.  For now she can be treated symptomatically with antiemetics. ? ?Plan: ?1) Start Zofran. ?2) Unless there is any overt gastroparesis metoclopramide needs to be discontinued. ?3) Continue with treatment of her  MI and CHF, per cardiology. ? LOS: 4 days  ? ?Carena Stream D ?12/27/2021, 6:36 AM  ?

## 2021-12-27 NOTE — Progress Notes (Addendum)
? ?Progress Note ? ?Patient Name: KEYAIRRA KOLINSKI ?Date of Encounter: 12/27/2021 ? ?[facility] Cardiologist: Dorris Carnes, MD  ? ?Subjective  ? ?Severe chest pain under left breast this morning.  This was radiating to suprapubic area.  Episode started after straining.  No bowel movement for past 5 days.  Patient feels bloated and nauseated. ? ?During my evaluation her pain was improving at 6 out of 10.  Her pain is reproducible with palpation.  At end of conversation pain spontaneously improved at 3 out of 10. ?EKG without acute changes. ? ?Inpatient Medications  ?  ?Scheduled Meds: ? amLODipine  5 mg Oral Daily  ? aspirin EC  81 mg Oral Daily  ? atorvastatin  80 mg Oral Daily  ? azithromycin  500 mg Oral Daily  ? fluticasone furoate-vilanterol  1 puff Inhalation Daily  ? furosemide  40 mg Intravenous BID  ? hydrALAZINE  25 mg Oral TID  ? insulin aspart  0-5 Units Subcutaneous QHS  ? insulin aspart  0-9 Units Subcutaneous TID WC  ? latanoprost  1 drop Both Eyes QHS  ? montelukast  10 mg Oral QHS  ? ondansetron (ZOFRAN) IV  4 mg Intravenous Q6H  ? pantoprazole (PROTONIX) IV  40 mg Intravenous Q12H  ? sodium chloride flush  3 mL Intravenous Q12H  ? ?Continuous Infusions: ? sodium chloride 10 mL/hr at 12/23/21 1053  ? ampicillin-sulbactam (UNASYN) IV 3 g (12/26/21 2001)  ? heparin 1,000 Units/hr (12/26/21 1228)  ? nitroGLYCERIN 15 mcg/min (12/26/21 1225)  ? ?PRN Meds: ?acetaminophen, albuterol, albuterol, bisacodyl, metoCLOPramide (REGLAN) injection, nitroGLYCERIN, white petrolatum  ? ?Vital Signs  ?  ?Vitals:  ? 12/26/21 1922 12/26/21 2123 12/27/21 0018 12/27/21 2993  ?BP: 133/77 130/77 (!) 114/46 128/71  ?Pulse: 60  60 62  ?Resp: 18  (!) 25 19  ?Temp: 98.1 ?F (36.7 ?C)  98.4 ?F (36.9 ?C) 98 ?F (36.7 ?C)  ?TempSrc: Oral  Oral Oral  ?SpO2: 100%  93% 98%  ?Weight:      ?Height:      ? ? ?Intake/Output Summary (Last 24 hours) at 12/27/2021 0823 ?Last data filed at 12/27/2021 0105 ?Gross per 24 hour  ?Intake 300 ml  ?Output  100 ml  ?Net 200 ml  ? ? ?  12/26/2021  ?  6:40 AM 12/23/2021  ? 10:25 AM 12/23/2021  ?  4:09 AM  ?Last 3 Weights  ?Weight (lbs) 178 lb 5.6 oz 181 lb 181 lb  ?Weight (kg) 80.9 kg 82.101 kg 82.101 kg  ?   ? ?Telemetry  ?  ?NSR, intermittent V pacing  - Personally Reviewed ? ?ECG  ?  ?V paced, non specific changes in V4-V5  - Personally Reviewed ? ?Physical Exam  ? ?GEN: No acute distress.   ?Neck: No JVD ?Cardiac: RRR, no murmurs, rubs, or gallops.  ?Respiratory: Clear to auscultation bilaterally. ?GI: Soft, tender to palpation at left upper quadrant  ?MS: No edema; No deformity. ?Neuro:  Nonfocal  ?Psych: Normal affect  ? ?Labs  ?  ?High Sensitivity Troponin:   ?Recent Labs  ?Lab 12/23/21 ?0426 12/23/21 ?0617  ?TROPONINIHS 187* 346*  ?   ?Chemistry ?Recent Labs  ?Lab 12/25/21 ?0017 12/26/21 ?0139 12/27/21 ?7169  ?NA 136 136 137  ?K 4.9 4.4 4.5  ?CL 108 111 108  ?CO2 19* 18* 18*  ?GLUCOSE 147* 153* 180*  ?BUN 41* 54* 59*  ?CREATININE 3.46* 3.17* 3.21*  ?CALCIUM 8.9 8.5* 9.0  ?MG 1.8 2.0 2.2  ?PROT  6.4* 6.0* 6.5  ?ALBUMIN 3.2* 2.9* 3.2*  ?AST 48* 33 36  ?ALT 19 20 28   ?ALKPHOS 61 65 85  ?BILITOT 0.9 1.6* 1.7*  ?GFRNONAA 13* 14* 14*  ?ANIONGAP 9 7 11   ?  ?Lipids  ?Recent Labs  ?Lab 12/23/21 ?1030  ?CHOL 120  ?TRIG 46  ?HDL 55  ?[place] 56  ?CHOLHDL 2.2  ?  ?Hematology ?Recent Labs  ?Lab 12/25/21 ?0017 12/26/21 ?0139 12/27/21 ?4193  ?WBC 12.6* 12.0* 11.5*  ?RBC 3.99 3.51* 3.54*  ?HGB 10.8* 9.7* 9.7*  ?HCT 32.1* 27.9* 28.2*  ?MCV 80.5 79.5* 79.7*  ?MCH 27.1 27.6 27.4  ?MCHC 33.6 34.8 34.4  ?RDW 15.6* 15.3 15.3  ?PLT 136* 124* 147*  ? ?Thyroid  ?Recent Labs  ?Lab 12/23/21 ?1030  ?TSH 3.434  ?FREET4 1.15*  ?  ?BNP ?Recent Labs  ?Lab 12/23/21 ?1030 12/26/21 ?0139 12/27/21 ?7902  ?BNP 230.4* 1,327.6* 1,711.2*  ?  ?DDimer No results for input(s): DDIMER in the last 168 hours.  ? ?Radiology  ?  ?NM Hepatobiliary Liver Func ? ?Result Date: 12/25/2021 ?CLINICAL DATA:  Abdominal pain and nausea for 4 days. Vomiting. Cholelithiasis.  EXAM: NUCLEAR MEDICINE HEPATOBILIARY IMAGING TECHNIQUE: Sequential images of the abdomen were obtained out to 60 minutes following intravenous administration of radiopharmaceutical. RADIOPHARMACEUTICALS:  5.5 mCi Tc-106m  Choletec IV COMPARISON:  None. FINDINGS: Prompt uptake and biliary excretion of activity by the liver is seen. Gallbladder activity is visualized, consistent with patency of cystic duct. Biliary activity passes into small bowel, consistent with patent common bile duct. IMPRESSION: Normal hepatobiliary scan, demonstrating patency of both the cystic and common bile ducts. Electronically Signed   By: Marlaine Hind M.D.   On: 12/25/2021 15:11  ? ?US RENAL ? ?Result Date: 12/25/2021 ?CLINICAL DATA:  Acute kidney injury. EXAM: RENAL / URINARY TRACT ULTRASOUND COMPLETE COMPARISON:  None. FINDINGS: Right Kidney: Renal measurements: 10.8 cm x 5.3 cm x 4.9 cm = volume: 144.5 mL. Diffusely increased echogenicity of the renal parenchyma is noted. 0.6 cm x 0.4 cm x 0.7 cm and 3.0 cm x 2.4 cm x 2.9 cm anechoic structures are seen within the right kidney. No abnormal flow is noted within these regions on color Doppler evaluation. No hydronephrosis is visualized. Left Kidney: Renal measurements: 12.5 cm x 4.6 cm x 4.3 cm = volume: 131.5 mL. Diffusely increased echogenicity of the renal parenchyma is noted. A 0.6 cm x 0.7 cm x 0.5 cm anechoic structure is seen within the left kidney. No abnormal flow is noted within this area on color Doppler evaluation. No hydronephrosis is visualized. Bladder: Appears normal for degree of bladder distention. Other: None. IMPRESSION: 1. Bilateral echogenic kidneys, likely secondary to medical renal disease. 2. Bilateral simple renal cysts. Electronically Signed   By: Virgina Norfolk M.D.   On: 12/25/2021 17:02  ? ?DG Chest Port 1 View ? ?Result Date: 12/26/2021 ?CLINICAL DATA:  Shortness of breath EXAM: PORTABLE CHEST 1 VIEW COMPARISON:  12/25/2021 FINDINGS: Cardiomegaly with  congested appearance of vessels. Small pleural effusions are possible. Stable aortic and hilar contours. Single chamber pacer into the right ventricle. IMPRESSION: Cardiomegaly and vascular congestion. Electronically Signed   By: Jorje Guild M.D.   On: 12/26/2021 06:26   ? ?Cardiac Studies  ? ?Echo 12/23/21 ? 1. Left ventricular ejection fraction, by estimation, is 30 to 35%. The  ?left ventricle has moderately decreased function. The left ventricle  ?demonstrates regional wall motion abnormalities (see scoring  ?diagram/findings for description). There is mild  ?  concentric left ventricular hypertrophy. Indeterminate diastolic filling  ?due to E-A fusion. Elevated left ventricular end-diastolic pressure.  ? 2. Right ventricular systolic function is mildly reduced. The right  ?ventricular size is moderately enlarged. There is moderately elevated  ?pulmonary artery systolic pressure.  ? 3. Left atrial size was severely dilated.  ? 4. Right atrial size was moderately dilated.  ? 5. The mitral valve is abnormal. Trivial mitral valve regurgitation.  ?Moderate mitral stenosis. The mean mitral valve gradient is 6.5 mmHg.  ? 6. Tricuspid valve regurgitation is moderate.  ? 7. The aortic valve is tricuspid. There is mild calcification of the  ?aortic valve. There is mild thickening of the aortic valve. Aortic valve  ?regurgitation is trivial. Aortic valve sclerosis is present, with no  ?evidence of aortic valve stenosis.  ? 8. The inferior vena cava is dilated in size with >50% respiratory  ?variability, suggesting right atrial pressure of 8 mmHg.  ? ?Comparison(s): Prior images reviewed side by side. Changes from prior  ?study are noted.  ? ?Conclusion(s)/Recommendation(s): Reduced LVEF with new wall motion  ?abnormalities. No LV thrombus. Elevated LVEDP. Findings communicated to  ?Dr. Acie Fredrickson.  ? ?Patient Profile  ?   ?86 y.o. female diabetes mellitus, hyperlipidemia, complete heart block s/p pacemaker, paroxysmal atrial  fibrillation/flutter and mod CAD (CT in 2017 with calcium scoreof  51  mod dz LAD; filling defect in LAA), and chronic kidney disease stage IV admitted for chest pain.  Course complicated by persistent nause

## 2021-12-27 NOTE — Progress Notes (Signed)
ANTICOAGULATION CONSULT NOTE ? ?Pharmacy Consult for Heparin ?Indication: chest pain/ACS ? ?Allergies  ?Allergen Reactions  ? Aspirin Shortness Of Breath and Other (See Comments)  ?  Caused asthma symptoms  ? Ramipril Cough  ? ? ?Patient Measurements: ?Height: 5\' 7"  (170.2 cm) ?Weight: 80.9 kg (178 lb 5.6 oz) ?IBW/kg (Calculated) : 61.6 ?Heparin Dosing Weight: 75 kg ? ?Vital Signs: ?Temp: 98 ?F (36.7 ?C) (03/28 7416) ?Temp Source: Oral (03/28 3845) ?BP: 128/71 (03/28 3646) ?Pulse Rate: 62 (03/28 0821) ? ?Labs: ?Recent Labs  ?  12/25/21 ?0017 12/26/21 ?0139 12/27/21 ?8032  ?HGB 10.8* 9.7* 9.7*  ?HCT 32.1* 27.9* 28.2*  ?PLT 136* 124* 147*  ?APTT 72* 62* 57*  ?HEPARINUNFRC 0.80* 0.72* 0.68  ?CREATININE 3.46* 3.17* 3.21*  ? ? ? ?Estimated Creatinine Clearance: 14.5 mL/min (A) (by C-G formula based on SCr of 3.21 mg/dL (H)). ? ?  ? ?Assessment: ?86 y.o. female with chest pain to continue on IV heparin.  Patient was on Eliquis PTA for history of Afib, so will use aPTT to guide heparin dosing until aPTT and heparin level correlate.  Last dose of Eliquis was on 3/23 at 1200. ? ?aPTT still below goal at 57 seconds; heparin level elevated as expected from recent Eliquis.  No issues reported. Hemoglobin stable overnight.  ? ?Goal of Therapy:  ?Heparin level 0.3-0.7 units/ml ?Monitor platelets by anticoagulation protocol: Yes ?  ?Plan:  ?Increase heparin infusion to 1100 units/hr ?Daily aPTT, heparin level and CBC. ?F/u plans to resume Eliquis eventually. ? ?Erin Hearing PharmD., BCPS ?Clinical Pharmacist ?12/27/2021 8:33 AM ? ?

## 2021-12-27 NOTE — TOC Progression Note (Addendum)
Transition of Care (TOC) - Progression Note  ? ? ?Patient Details  ?Name: [name] ?MRN: [id number] ?Date of Birth: 17-Feb-1936 ? ?Transition of Care (TOC) CM/SW Contact  ?Angelita Ingles, RN ?Phone [phone] ? ?12/27/2021, 3:35 PM ? ?Clinical Narrative:    ? ?Transition of Care (TOC) Screening Note ? ? ?Patient Details  ?Name: [name] ?Date of Birth: Jan 10, 1936 ? ? ?Transition of Care (TOC) CM/SW Contact:    ?Angelita Ingles, RN ?Phone Number: ?12/27/2021, 3:35 PM ? ? ? ?Transition of Care Department [facility]) has reviewed patient and no TOC needs have been identified at this time. We will continue to monitor patient advancement through interdisciplinary progression rounds.  ? ? ? ? ?  ?  ? ?Expected Discharge Plan and Services ?  ?  ?  ?  ?  ?                ?  ?  ?  ?  ?  ?  ?  ?  ?  ?  ? ? ?Social Determinants of Health (SDOH) Interventions ?  ? ?Readmission Risk Interventions ?   ? View : No data to display.  ?  ?  ?  ? ? ?

## 2021-12-27 NOTE — Progress Notes (Incomplete)
Lab called with critical troponin: 2,797. RN text paged PA with this info and will continue to monitor pt. ?

## 2021-12-27 NOTE — Progress Notes (Signed)
Subjective:  hemodynamically stable overnight-  only 100 of UOP recorded ? Pt thinks more.  BUN and crt up just a little. Did not have a good night, still having abd pain-  and also CP and SOB-  took 3 breathing tx to improve-  question of if it is volume overload ? ?Objective ?Vital signs in last 24 hours: ?Vitals:  ? 12/26/21 1633 12/26/21 1922 12/26/21 2123 12/27/21 0018  ?BP: 126/66 133/77 130/77 (!) 114/46  ?Pulse: 66 60  60  ?Resp: 20 18  (!) 25  ?Temp: 98.4 ?F (36.9 ?C) 98.1 ?F (36.7 ?C)  98.4 ?F (36.9 ?C)  ?TempSrc: Oral Oral  Oral  ?SpO2: 100% 100%  93%  ?Weight:      ?Height:      ? ?Weight change:  ? ?Intake/Output Summary (Last 24 hours) at 12/27/2021 0751 ?Last data filed at 12/27/2021 0105 ?Gross per 24 hour  ?Intake 300 ml  ?Output 100 ml  ?Net 200 ml  ? ? ?Assessment/Plan: 86 year old BF with multiple medical issues to include CKD-  crt in mid to high 1's-  admit with CP and decreased EF but many other hospital complications as well including A on CRF ?1.Renal- baseline stage 4 CKD but had been fairly stable-  now with A on CRF in the setting of decreased EF and soft BP-  u/s negative- urine pretty bland-  no protein.  feel that A on crf is hemodynamically mediated with low BP this admit.  Dr. Caryl Comes has decreased both her amlodipine and hydralazine appropriately-  crt is trying to plateau it seems after worsening for 3 days.  No indications for RRT.  I know cardiology is waiting for renal function to get better so she can get a heart cath-  if it is not an emergency would appreciate more time to maximize but if is emergency obviously go ahead  ?2. Hypertension/volume  - She was getting fluids yesterday but not sure that she needed based on appearance of her CXR.  Her BP did drop I agree with the modification of BP meds to date-  allowing her BP to drift up some-  is better.  Will give  short term lasix today to help at least improve her breathing situation ?3. Anemia  - hgb just under 10-  check iron  stores and replete if needed ?4. ID-  thought to have PNA- on Unasyn and azithro  ?5. Nausea-  thought to be just residual from her other illness -  reglan and antiemetics  ? ? ?Louis Meckel  ? ? ?Labs: ?Basic Metabolic Panel: ?Recent Labs  ?Lab 12/25/21 ?0017 12/26/21 ?0139 12/27/21 ?3875  ?NA 136 136 137  ?K 4.9 4.4 4.5  ?CL 108 111 108  ?CO2 19* 18* 18*  ?GLUCOSE 147* 153* 180*  ?BUN 41* 54* 59*  ?CREATININE 3.46* 3.17* 3.21*  ?CALCIUM 8.9 8.5* 9.0  ? ?Liver Function Tests: ?Recent Labs  ?Lab 12/25/21 ?0017 12/26/21 ?0139 12/27/21 ?6433  ?AST 48* 33 36  ?ALT 19 20 28   ?ALKPHOS 61 65 85  ?BILITOT 0.9 1.6* 1.7*  ?PROT 6.4* 6.0* 6.5  ?ALBUMIN 3.2* 2.9* 3.2*  ? ?Recent Labs  ?Lab 12/23/21 ?0426 12/25/21 ?0017  ?LIPASE 28 31  ? ?No results for input(s): AMMONIA in the last 168 hours. ?CBC: ?Recent Labs  ?Lab 12/23/21 ?0426 12/24/21 ?0018 12/25/21 ?0017 12/26/21 ?0139 12/27/21 ?2951  ?WBC 7.0 11.7* 12.6* 12.0* 11.5*  ?HGB 12.0 12.0 10.8* 9.7* 9.7*  ?HCT 35.5*  34.9* 32.1* 27.9* 28.2*  ?MCV 77.3* 79.1* 80.5 79.5* 79.7*  ?PLT 184 150 136* 124* 147*  ? ?Cardiac Enzymes: ?No results for input(s): CKTOTAL, CKMB, CKMBINDEX, TROPONINI in the last 168 hours. ?CBG: ?Recent Labs  ?Lab 12/26/21 ?1142 12/26/21 ?1310 12/26/21 ?1635 12/26/21 ?2123 12/27/21 ?0630  ?GLUCAP 148* 174* 194* 188* 160*  ? ? ?Iron Studies: No results for input(s): IRON, TIBC, TRANSFERRIN, FERRITIN in the last 72 hours. ?Studies/Results: ?NM Hepatobiliary Liver Func ? ?Result Date: 12/25/2021 ?CLINICAL DATA:  Abdominal pain and nausea for 4 days. Vomiting. Cholelithiasis. EXAM: NUCLEAR MEDICINE HEPATOBILIARY IMAGING TECHNIQUE: Sequential images of the abdomen were obtained out to 60 minutes following intravenous administration of radiopharmaceutical. RADIOPHARMACEUTICALS:  5.5 mCi Tc-29m  Choletec IV COMPARISON:  None. FINDINGS: Prompt uptake and biliary excretion of activity by the liver is seen. Gallbladder activity is visualized, consistent with  patency of cystic duct. Biliary activity passes into small bowel, consistent with patent common bile duct. IMPRESSION: Normal hepatobiliary scan, demonstrating patency of both the cystic and common bile ducts. Electronically Signed   By: Marlaine Hind M.D.   On: 12/25/2021 15:11  ? ?US RENAL ? ?Result Date: 12/25/2021 ?CLINICAL DATA:  Acute kidney injury. EXAM: RENAL / URINARY TRACT ULTRASOUND COMPLETE COMPARISON:  None. FINDINGS: Right Kidney: Renal measurements: 10.8 cm x 5.3 cm x 4.9 cm = volume: 144.5 mL. Diffusely increased echogenicity of the renal parenchyma is noted. 0.6 cm x 0.4 cm x 0.7 cm and 3.0 cm x 2.4 cm x 2.9 cm anechoic structures are seen within the right kidney. No abnormal flow is noted within these regions on color Doppler evaluation. No hydronephrosis is visualized. Left Kidney: Renal measurements: 12.5 cm x 4.6 cm x 4.3 cm = volume: 131.5 mL. Diffusely increased echogenicity of the renal parenchyma is noted. A 0.6 cm x 0.7 cm x 0.5 cm anechoic structure is seen within the left kidney. No abnormal flow is noted within this area on color Doppler evaluation. No hydronephrosis is visualized. Bladder: Appears normal for degree of bladder distention. Other: None. IMPRESSION: 1. Bilateral echogenic kidneys, likely secondary to medical renal disease. 2. Bilateral simple renal cysts. Electronically Signed   By: Virgina Norfolk M.D.   On: 12/25/2021 17:02  ? ?DG Chest Port 1 View ? ?Result Date: 12/26/2021 ?CLINICAL DATA:  Shortness of breath EXAM: PORTABLE CHEST 1 VIEW COMPARISON:  12/25/2021 FINDINGS: Cardiomegaly with congested appearance of vessels. Small pleural effusions are possible. Stable aortic and hilar contours. Single chamber pacer into the right ventricle. IMPRESSION: Cardiomegaly and vascular congestion. Electronically Signed   By: Jorje Guild M.D.   On: 12/26/2021 06:26   ?Medications: ?Infusions: ? sodium chloride 10 mL/hr at 12/23/21 1053  ? ampicillin-sulbactam (UNASYN) IV 3 g  (12/26/21 2001)  ? heparin 1,000 Units/hr (12/26/21 1228)  ? nitroGLYCERIN 15 mcg/min (12/26/21 1225)  ? ? ?Scheduled Medications: ? amLODipine  5 mg Oral Daily  ? aspirin EC  81 mg Oral Daily  ? atorvastatin  80 mg Oral Daily  ? azithromycin  500 mg Oral Daily  ? fluticasone furoate-vilanterol  1 puff Inhalation Daily  ? hydrALAZINE  25 mg Oral TID  ? insulin aspart  0-5 Units Subcutaneous QHS  ? insulin aspart  0-9 Units Subcutaneous TID WC  ? latanoprost  1 drop Both Eyes QHS  ? montelukast  10 mg Oral QHS  ? ondansetron (ZOFRAN) IV  4 mg Intravenous Q6H  ? pantoprazole (PROTONIX) IV  40 mg Intravenous Q12H  ? sodium chloride  flush  3 mL Intravenous Q12H  ? ? have reviewed scheduled and prn medications. ? ?Physical Exam: ?General:  in mild distress-  increased WOB ?Heart: RRR ?Lungs: some crackles and wheezes ?Abdomen: soft,  still with mild LQ pain-  took lax ?Extremities: min peripheral edema  ? ? ? ?12/27/2021,7:51 AM ? LOS: 4 days  ? ?  ? ? ? ? ?

## 2021-12-27 NOTE — Progress Notes (Signed)
?                                  PROGRESS NOTE                                             ?                                                                                                                     ?                                         ? ? Patient Demographics:  ? ? [name], is a 86 y.o. female, DOB - 1936-02-21, JJK:[id number] ? ?Outpatient Primary MD for the patient is [name], FNP    LOS - 4  Admit date - 12/23/2021   ? ?Chief Complaint  ?Patient presents with  ? Chest Pain  ?    ? ?Brief Narrative (HPI from H&P) 86 y.o. female with medical history significant of chronic systolic CHF LVEF 37-16%, PAF on Eliquis, IDDM, HTN, HLD, CKD stage IIIb, GERD, COPD Gold stage II, Heart block on PPM, who was admitted under cardiology service for continuous onset of chest pain since yesterday, complained about persistent feeling nausea and frequent dry heaves for 3 days.  Was admitted by cardiology service placed on heparin drip for NSTEMI and hospitalist team was consulted for nausea evaluation ? ? Subjective:  ? ?Patient in bed denies any headache, mild nausea but no abdominal pain, has mild shortness of breath upon laying flat today, no focal weakness ? ? Assessment  & Plan :  ? ?Chest pain with NSTEMI acute on chronic CHF decompensation with EF 30% and regional wall motion abnormalities on echocardiogram.  Defer management of this issue to primary team which is cardiology, currently on heparin and nitroglycerin drip, also on aspirin and statin, low-dose beta-blocker continued, defer further management to cardiology team. ? ?2.  Persistent nausea with some right upper quadrant pain and tenderness on exam.  CT scan revealed gallstones, negative HIDA, seen by GI team as well, better with PPI and Zofran, RUQ pain resolved,  Note nausea could be due to NSTEMI as well. ? ?3.  Possible pneumonia.  Mild cough, much better with IV ABX continue total 5 days. ? ?4.   Paroxysmal atrial fibrillation.  Mali vas 2 score of greater than 3.  Currently on heparin drip instead of home Eliquis, low-dose beta-blocker.  Defer management of this issue to cardiology which is the primary team. ? ?5.  COPD.  Stable.  Supportive care. ? ?6.  AKI on CKD 3B.  Baseline creatinine around 1.8.  Nephrology on board and managing this  issue. ? ?7.  DM type II.  Recent A1c 5.4.  Sliding scale for now ? ?CBG (last 3)  ?Recent Labs  ?  12/26/21 ?1635 12/26/21 ?2123 12/27/21 ?0630  ?GLUCAP 194* 188* 160*  ? ? ? ?   ? ?Condition - Extremely Guarded ? ?Family Communication  : Daughter bedside on 12/25/2021, 12/26/21 ? ?Code Status :  Full ? ?Consults  : [place] consulting, GI consulting, nephrology, cardiology primary ? ?PUD Prophylaxis : PPI ? ? Procedures  :    ? ?HIDA - Non acute ? ?CT - 1. Cholelithiasis without cholecystitis. 2. Right lower lobe airspace disease concerning for pneumonia. Other scattered areas of bilateral lower lobe consolidation favor atelectasis. 3. Small bilateral pleural effusions, right greater than left. 4. Cardiomegaly. 5.  Aortic Atherosclerosis  ? ?TTE - 1. Left ventricular ejection fraction, by estimation, is 30 to 35%. The left ventricle has moderately decreased function. The left ventricle demonstrates regional wall motion abnormalities (see scoring diagram/findings for description). There is mild concentric left ventricular hypertrophy. Indeterminate diastolic filling due to E-A fusion. Elevated left ventricular end-diastolic pressure.  2. Right ventricular systolic function is mildly reduced. The right ventricular size is moderately enlarged. There is moderately elevated pulmonary artery systolic pressure.  3. Left atrial size was severely dilated.  4. Right atrial size was moderately dilated.  5. The mitral valve is abnormal. Trivial mitral valve regurgitation. Moderate mitral stenosis. The mean mitral valve gradient is 6.5 mmHg.  6. Tricuspid valve regurgitation is moderate.   7. The aortic valve is tricuspid. There is mild calcification of the aortic valve. There is mild thickening of the aortic valve. Aortic valve regurgitation is trivial. Aortic valve sclerosis is present, with no evidence of aortic valve stenosis.  8. The inferior vena cava is dilated in size with >50% respiratory variability, suggesting right atrial pressure of 8 mmHg. Comparison(s): Prior images reviewed side by side. Changes from prior study are noted. Conclusion(s)/Recommendation(s): Reduced LVEF with new wall motion abnormalities. No LV thrombus. Elevated LVEDP. Findings communicated to Dr. Acie Fredrickson. ? ?   ? ?Disposition Plan  :   ? ?Status is: Inpatient ? ? ?DVT Prophylaxis  :  Hep gtt ? ?Lab Results  ?Component Value Date  ? PLT 147 (L) 12/27/2021  ? ? ?Diet :  ?Diet Order   ? ?       ?  Diet Heart Room service appropriate? Yes; Fluid consistency: Thin  Diet effective now       ?  ? ?  ?  ? ?  ?  ? ?Inpatient Medications ? ?Scheduled Meds: ? amLODipine  5 mg Oral Daily  ? aspirin EC  81 mg Oral Daily  ? atorvastatin  80 mg Oral Daily  ? azithromycin  500 mg Oral Daily  ? fluticasone furoate-vilanterol  1 puff Inhalation Daily  ? furosemide  40 mg Intravenous BID  ? hydrALAZINE  25 mg Oral TID  ? insulin aspart  0-5 Units Subcutaneous QHS  ? insulin aspart  0-9 Units Subcutaneous TID WC  ? latanoprost  1 drop Both Eyes QHS  ? montelukast  10 mg Oral QHS  ? ondansetron (ZOFRAN) IV  4 mg Intravenous Q6H  ? pantoprazole (PROTONIX) IV  40 mg Intravenous Q12H  ? sodium chloride flush  3 mL Intravenous Q12H  ? ?Continuous Infusions: ? sodium chloride 10 mL/hr at 12/23/21 1053  ? ampicillin-sulbactam (UNASYN) IV 3 g (12/26/21 2001)  ? heparin 1,100 Units/hr (12/27/21 0925)  ? nitroGLYCERIN 15  mcg/min (12/26/21 1225)  ? ?PRN Meds:.acetaminophen, albuterol, albuterol, bisacodyl, metoCLOPramide (REGLAN) injection, nitroGLYCERIN, white petrolatum ? ?Antibiotics  :   ? ?Anti-infectives (From admission, onward)  ? ? Start      Dose/Rate Route Frequency Ordered Stop  ? 12/25/21 2000  Ampicillin-Sulbactam (UNASYN) 3 g in sodium chloride 0.9 % 100 mL IVPB       ? 3 g ?200 mL/hr over 30 Minutes Intravenous Every 12 hours 12/25/21 1902    ? 12/24/21 1715  cefTRIAXone (ROCEPHIN) 1 g in sodium chloride 0.9 % 100 mL IVPB  Status:  Discontinued       ? 1 g ?200 mL/hr over 30 Minutes Intravenous Every 24 hours 12/24/21 1616 12/25/21 1902  ? 12/24/21 1715  azithromycin (ZITHROMAX) tablet 500 mg       ? 500 mg Oral Daily 12/24/21 1616    ? ?  ? ? ? Time Spent in minutes  30 ? ? ?Lala Lund M.D on 12/27/2021 at 10:16 AM ? ?To page go to [email or web address]  ? ?[facility] -  Office  [phone] ? ?See all Orders from today for further details ? ? ? Objective:  ? ?Vitals:  ? 12/26/21 2123 12/27/21 0018 12/27/21 1191 12/27/21 4782  ?BP: 130/77 (!) 114/46 128/71   ?Pulse:  60 62   ?Resp:  (!) 25 19   ?Temp:  98.4 ?F (36.9 ?C) 98 ?F (36.7 ?C)   ?TempSrc:  Oral Oral   ?SpO2:  93% 98% 98%  ?Weight:      ?Height:      ? ? ?Wt Readings from Last 3 Encounters:  ?12/26/21 80.9 kg  ?12/12/21 82.5 kg  ?11/04/21 83.1 kg  ? ? ? ?Intake/Output Summary (Last 24 hours) at 12/27/2021 1016 ?Last data filed at 12/27/2021 0105 ?Gross per 24 hour  ?Intake 300 ml  ?Output 100 ml  ?Net 200 ml  ? ? ? ?Physical Exam ? ?Awake Alert, No new F.N deficits, Normal affect ?[place].AT,PERRAL ?Supple Neck, No JVD,   ?Symmetrical Chest wall movement, Good air movement bilaterally, few bibasilar Rales ?RRR,No Gallops, Rubs or new Murmurs,  ?+ve B.Sounds, Abd Soft, No tenderness,   ?No Cyanosis, Clubbing or edema  ? ?  ? ? Data Review:  ? ? ?CBC ?Recent Labs  ?Lab 12/23/21 ?0426 12/24/21 ?0018 12/25/21 ?0017 12/26/21 ?0139 12/27/21 ?9562  ?WBC 7.0 11.7* 12.6* 12.0* 11.5*  ?HGB 12.0 12.0 10.8* 9.7* 9.7*  ?HCT 35.5* 34.9* 32.1* 27.9* 28.2*  ?PLT 184 150 136* 124* 147*  ?MCV 77.3* 79.1* 80.5 79.5* 79.7*  ?MCH 26.1 27.2 27.1 27.6 27.4  ?MCHC 33.8 34.4 33.6 34.8 34.4  ?RDW 16.2* 15.8* 15.6*  15.3 15.3  ? ? ?Electrolytes ?Recent Labs  ?Lab 12/23/21 ?0426 12/23/21 ?1030 12/24/21 ?0018 12/24/21 ?1500 12/24/21 ?1506 12/25/21 ?0017 12/26/21 ?0139 12/27/21 ?1308  ?NA 139  --  137  --  138 136 136 137

## 2021-12-27 NOTE — Progress Notes (Incomplete)
Pt reporting 8/10 chest pain. This RN and pm RN went to bedside to assess pt. Nitro gtt running as well as heparin gtt. EKG being done. RN paged PA, who immediately returned call and said he will come to bedside. RN continuing to monitor. ?

## 2021-12-28 ENCOUNTER — Encounter (HOSPITAL_COMMUNITY)
Admission: EM | Disposition: A | Discharge status: Home Health | Payer: Self-pay | Source: Home / Self Care | Attending: Internal Medicine

## 2021-12-28 DIAGNOSIS — N179 Acute kidney failure, unspecified: Secondary | ICD-10-CM | POA: Diagnosis not present

## 2021-12-28 DIAGNOSIS — N184 Chronic kidney disease, stage 4 (severe): Secondary | ICD-10-CM | POA: Diagnosis not present

## 2021-12-28 DIAGNOSIS — I2510 Atherosclerotic heart disease of native coronary artery without angina pectoris: Secondary | ICD-10-CM

## 2021-12-28 DIAGNOSIS — I214 Non-ST elevation (NSTEMI) myocardial infarction: Secondary | ICD-10-CM | POA: Diagnosis not present

## 2021-12-28 DIAGNOSIS — I5021 Acute systolic (congestive) heart failure: Secondary | ICD-10-CM | POA: Diagnosis not present

## 2021-12-28 HISTORY — PX: CORONARY BALLOON ANGIOPLASTY: CATH118233

## 2021-12-28 HISTORY — PX: RIGHT/LEFT HEART CATH AND CORONARY ANGIOGRAPHY: CATH118266

## 2021-12-28 HISTORY — PX: CORONARY STENT INTERVENTION: CATH118234

## 2021-12-28 LAB — GLUCOSE, CAPILLARY
Glucose-Capillary: 149 mg/dL — ABNORMAL HIGH (ref 70–99)
Glucose-Capillary: 147 mg/dL — ABNORMAL HIGH (ref 70–99)
Glucose-Capillary: 151 mg/dL — ABNORMAL HIGH (ref 70–99)
Glucose-Capillary: 146 mg/dL — ABNORMAL HIGH (ref 70–99)

## 2021-12-28 LAB — COMPREHENSIVE METABOLIC PANEL
ALT: 38 U/L (ref 0–44)
AST: 44 U/L — ABNORMAL HIGH (ref 15–41)
Albumin: 3 g/dL — ABNORMAL LOW (ref 3.5–5.0)
Alkaline Phosphatase: 84 U/L (ref 38–126)
Anion gap: 13 (ref 5–15)
BUN: 62 mg/dL — ABNORMAL HIGH (ref 8–23)
CO2: 18 mmol/L — ABNORMAL LOW (ref 22–32)
Calcium: 8.9 mg/dL (ref 8.9–10.3)
Chloride: 106 mmol/L (ref 98–111)
Creatinine, Ser: 3.49 mg/dL — ABNORMAL HIGH (ref 0.44–1.00)
GFR, Estimated: 12 mL/min — ABNORMAL LOW (ref >60)
Glucose, Bld: 148 mg/dL — ABNORMAL HIGH (ref 70–99)
Potassium: 4 mmol/L (ref 3.5–5.1)
Sodium: 137 mmol/L (ref 135–145)
Total Bilirubin: 1.7 mg/dL — ABNORMAL HIGH (ref 0.3–1.2)
Total Protein: 6.1 g/dL — ABNORMAL LOW (ref 6.5–8.1)

## 2021-12-28 LAB — CBC
HCT: 26.9 % — ABNORMAL LOW (ref 36.0–46.0)
Hemoglobin: 9.3 g/dL — ABNORMAL LOW (ref 12.0–15.0)
MCH: 27.7 pg (ref 26.0–34.0)
MCHC: 34.6 g/dL (ref 30.0–36.0)
MCV: 80.1 fL (ref 80.0–100.0)
Platelets: 151 10*3/uL (ref 150–400)
RBC: 3.36 MIL/uL — ABNORMAL LOW (ref 3.87–5.11)
RDW: 15.9 % — ABNORMAL HIGH (ref 11.5–15.5)
WBC: 10 10*3/uL (ref 4.0–10.5)
nRBC: 2.7 % — ABNORMAL HIGH (ref 0.0–0.2)

## 2021-12-28 LAB — HEPARIN LEVEL (UNFRACTIONATED): Heparin Unfractionated: 0.72 IU/mL — ABNORMAL HIGH (ref 0.30–0.70)

## 2021-12-28 LAB — MAGNESIUM: Magnesium: 2.1 mg/dL (ref 1.7–2.4)

## 2021-12-28 LAB — BRAIN NATRIURETIC PEPTIDE: B Natriuretic Peptide: 1969.3 pg/mL — ABNORMAL HIGH (ref 0.0–100.0)

## 2021-12-28 LAB — PATHOLOGIST SMEAR REVIEW

## 2021-12-28 LAB — APTT: aPTT: 65 seconds — ABNORMAL HIGH (ref 24–36)

## 2021-12-28 SURGERY — RIGHT/LEFT HEART CATH AND CORONARY ANGIOGRAPHY
Anesthesia: LOCAL

## 2021-12-28 MED ORDER — VERAPAMIL HCL 2.5 MG/ML IV SOLN
INTRAVENOUS | Status: AC
  Filled 2021-12-28: qty 2

## 2021-12-28 MED ORDER — HEPARIN (PORCINE) IN NACL 2-0.9 UNITS/ML
INTRAMUSCULAR | Status: DC | PRN
  Administered 2021-12-28: 10 mL via INTRA_ARTERIAL

## 2021-12-28 MED ORDER — LIDOCAINE HCL (PF) 1 % IJ SOLN
INTRAMUSCULAR | Status: AC
  Filled 2021-12-28: qty 30

## 2021-12-28 MED ORDER — MIDAZOLAM HCL 2 MG/2ML IJ SOLN
INTRAMUSCULAR | Status: AC
  Filled 2021-12-28: qty 2

## 2021-12-28 MED ORDER — HEPARIN (PORCINE) IN NACL 1000-0.9 UT/500ML-% IV SOLN
INTRAVENOUS | Status: AC
  Filled 2021-12-28: qty 500

## 2021-12-28 MED ORDER — CLOPIDOGREL BISULFATE 300 MG PO TABS
ORAL_TABLET | ORAL | Status: AC
  Filled 2021-12-28: qty 2

## 2021-12-28 MED ORDER — MIDAZOLAM HCL 2 MG/2ML IJ SOLN
INTRAMUSCULAR | Status: DC | PRN
  Administered 2021-12-28 (×2): 1 mg via INTRAVENOUS

## 2021-12-28 MED ORDER — ASPIRIN 81 MG PO CHEW
81.0000 mg | CHEWABLE_TABLET | ORAL | Status: AC
  Administered 2021-12-28: 81 mg via ORAL
  Filled 2021-12-28: qty 1

## 2021-12-28 MED ORDER — SODIUM CHLORIDE 0.9 % IV SOLN: 250.0000 mL | INTRAVENOUS | Status: DC | PRN

## 2021-12-28 MED ORDER — FUROSEMIDE 10 MG/ML IJ SOLN
40.0000 mg | Freq: Two times a day (BID) | INTRAMUSCULAR | Status: DC
  Administered 2021-12-28: 40 mg via INTRAVENOUS
  Filled 2021-12-28 (×2): qty 4

## 2021-12-28 MED ORDER — SODIUM CHLORIDE 0.9% FLUSH
3.0000 mL | Freq: Two times a day (BID) | INTRAVENOUS | Status: DC
  Administered 2021-12-28 – 2022-01-03 (×10): 3 mL via INTRAVENOUS

## 2021-12-28 MED ORDER — CLOPIDOGREL BISULFATE 75 MG PO TABS
75.0000 mg | ORAL_TABLET | Freq: Every day | ORAL | Status: DC
  Administered 2021-12-29 – 2022-01-04 (×7): 75 mg via ORAL
  Filled 2021-12-28 (×7): qty 1

## 2021-12-28 MED ORDER — FENTANYL CITRATE (PF) 100 MCG/2ML IJ SOLN
INTRAMUSCULAR | Status: DC | PRN
  Administered 2021-12-28 (×2): 25 ug via INTRAVENOUS

## 2021-12-28 MED ORDER — SODIUM CHLORIDE 0.9 % IV SOLN: INTRAVENOUS | Status: DC

## 2021-12-28 MED ORDER — HEPARIN SODIUM (PORCINE) 1000 UNIT/ML IJ SOLN
INTRAMUSCULAR | Status: DC | PRN
Start: 2021-12-28 — End: 2021-12-28
  Administered 2021-12-28 (×2): 5000 [IU] via INTRAVENOUS
  Administered 2021-12-28: 4000 [IU] via INTRAVENOUS

## 2021-12-28 MED ORDER — HEPARIN SODIUM (PORCINE) 1000 UNIT/ML IJ SOLN
INTRAMUSCULAR | Status: AC
  Filled 2021-12-28: qty 10

## 2021-12-28 MED ORDER — FAMOTIDINE IN NACL 20-0.9 MG/50ML-% IV SOLN
INTRAVENOUS | Status: AC | PRN
  Administered 2021-12-28: 20 mg via INTRAVENOUS

## 2021-12-28 MED ORDER — LABETALOL HCL 5 MG/ML IV SOLN: 10.0000 mg | INTRAVENOUS | Status: AC | PRN

## 2021-12-28 MED ORDER — HEPARIN (PORCINE) IN NACL 1000-0.9 UT/500ML-% IV SOLN
INTRAVENOUS | Status: DC | PRN
  Administered 2021-12-28 (×2): 500 mL

## 2021-12-28 MED ORDER — SODIUM CHLORIDE 0.9% FLUSH
3.0000 mL | Freq: Two times a day (BID) | INTRAVENOUS | Status: DC
  Administered 2021-12-28 – 2022-01-04 (×8): 3 mL via INTRAVENOUS

## 2021-12-28 MED ORDER — SODIUM CHLORIDE 0.9% FLUSH: 3.0000 mL | INTRAVENOUS | Status: DC | PRN

## 2021-12-28 MED ORDER — LIDOCAINE HCL (PF) 1 % IJ SOLN
INTRAMUSCULAR | Status: DC | PRN
  Administered 2021-12-28 (×2): 2 mL

## 2021-12-28 MED ORDER — HYDRALAZINE HCL 20 MG/ML IJ SOLN: 10.0000 mg | INTRAMUSCULAR | Status: AC | PRN

## 2021-12-28 MED ORDER — IOHEXOL 350 MG/ML SOLN
INTRAVENOUS | Status: DC | PRN
  Administered 2021-12-28: 85 mL

## 2021-12-28 MED ORDER — ADENOSINE (DIAGNOSTIC) FOR INTRACORONARY USE
INTRAVENOUS | Status: DC | PRN
  Administered 2021-12-28 (×2): 36 ug via INTRACORONARY

## 2021-12-28 MED ORDER — FENTANYL CITRATE (PF) 100 MCG/2ML IJ SOLN
INTRAMUSCULAR | Status: AC
Start: 2021-12-28 — End: ?
  Filled 2021-12-28: qty 2

## 2021-12-28 MED ORDER — CLOPIDOGREL BISULFATE 300 MG PO TABS
ORAL_TABLET | ORAL | Status: DC | PRN
  Administered 2021-12-28: 600 mg via ORAL

## 2021-12-28 MED ORDER — SODIUM CHLORIDE 0.9 % IV SOLN
125.0000 mg | Freq: Every day | INTRAVENOUS | Status: AC
  Administered 2021-12-28 – 2022-01-02 (×6): 125 mg via INTRAVENOUS
  Filled 2021-12-28 (×6): qty 10

## 2021-12-28 MED ORDER — FAMOTIDINE IN NACL 20-0.9 MG/50ML-% IV SOLN
INTRAVENOUS | Status: AC
  Filled 2021-12-28: qty 50

## 2021-12-28 MED ORDER — SODIUM CHLORIDE 0.9 % IV SOLN: INTRAVENOUS | Status: AC

## 2021-12-28 MED ORDER — SODIUM CHLORIDE 0.9 % IV SOLN
INTRAVENOUS | Status: AC | PRN
  Administered 2021-12-28: 10 mL/h via INTRAVENOUS

## 2021-12-28 SURGICAL SUPPLY — 19 items
BALLN SAPPHIRE 2.0X12 (BALLOONS) ×2
BALLN SAPPHIRE ~~LOC~~ 3.5X12 (BALLOONS) ×1 IMPLANT
BALLOON SAPPHIRE 2.0X12 (BALLOONS) IMPLANT
CATH 5FR JL3.5 JR4 ANG PIG MP (CATHETERS) ×1 IMPLANT
CATH BALLN WEDGE 5F 110CM (CATHETERS) ×1 IMPLANT
CATH VISTA GUIDE 6FR XBLAD3.5 (CATHETERS) ×1 IMPLANT
GLIDESHEATH SLEND SS 6F .021 (SHEATH) ×1 IMPLANT
GUIDEWIRE INQWIRE 1.5J.035X260 (WIRE) IMPLANT
INQWIRE 1.5J .035X260CM (WIRE) ×2
KIT ENCORE 26 ADVANTAGE (KITS) ×1 IMPLANT
KIT HEART LEFT (KITS) ×3 IMPLANT
PACK CARDIAC CATHETERIZATION (CUSTOM PROCEDURE TRAY) ×3 IMPLANT
SHEATH GLIDE SLENDER 4/5FR (SHEATH) ×1 IMPLANT
STENT SYNERGY XD 3.0X16 (Permanent Stent) IMPLANT
SYNERGY XD 3.0X16 (Permanent Stent) ×2 IMPLANT
SYR MEDRAD MARK 7 150ML (SYRINGE) ×3 IMPLANT
TRANSDUCER W/STOPCOCK (MISCELLANEOUS) ×3 IMPLANT
TUBING CIL FLEX 10 FLL-RA (TUBING) ×3 IMPLANT
WIRE COUGAR XT STRL 190CM (WIRE) ×2 IMPLANT

## 2021-12-28 NOTE — Progress Notes (Signed)
?                                  PROGRESS NOTE                                             ?                                                                                                                     ?                                         ? ? Patient Demographics:  ? ? [name], is a 86 y.o. female, DOB - [birth year], [id number]:[id number] ? ?Outpatient Primary MD for the patient is [name], FNP    LOS - 5  Admit date - 12/23/2021   ? ?Chief Complaint  ?Patient presents with  ? Chest Pain  ?    ? ?Brief Narrative (HPI from H&P) 86 y.o. female with medical history significant of chronic systolic CHF LVEF 10-27%, PAF on Eliquis, IDDM, HTN, HLD, CKD stage IIIb, GERD, COPD Gold stage II, Heart block on PPM, who was admitted under cardiology service for continuous onset of chest pain since yesterday, complained about persistent feeling nausea and frequent dry heaves for 3 days.  Was admitted by cardiology service placed on heparin drip for NSTEMI and hospitalist team was consulted for nausea evaluation. ? ? ?Discussed with attending Dr. Harrington Challenger hospitalist team will sign off kindly call with questions. ? ? Subjective:  ? ?Patient in bed having some left-sided chest discomfort, no shortness of breath, no abdominal pain no nausea.  No focal weakness. ? ? Assessment  & Plan :  ? ?Chest pain with NSTEMI acute on chronic CHF decompensation with EF 30% and regional wall motion abnormalities on echocardiogram.  Defer management of this issue to primary team which is cardiology, currently on heparin and nitroglycerin drip, also on aspirin and statin, low-dose beta-blocker continued, having intermittent left-sided chest discomfort and at times bouts of CHF with shortness of breath and nausea, might be worthwhile to do a left heart cath with full understanding that she might end up on dialysis, patient and family were counseled and they will discuss among themselves and talk to the  cardiology team. ? ?2.  Persistent nausea with some right upper quadrant pain and tenderness on exam.  CT scan revealed gallstones, negative HIDA, seen by GI team as well, better with PPI and Zofran, RUQ pain resolved,  Note nausea likely due to NSTEMI as well. ? ?3.  Possible pneumonia.  Mild cough, much better with IV ABX continue total 5 days. ? ?4.  Paroxysmal atrial fibrillation.  Mali  vas 2 score of greater than 3.  Currently on heparin drip instead of home Eliquis, low-dose beta-blocker.  Defer management of this issue to cardiology which is the primary team. ? ?5.  COPD.  Stable.  Supportive care. ? ?6.  AKI on CKD 3B.  Baseline creatinine around 1.8.  Nephrology on board and managing this issue.  Kindly see #1 above. ?7.  DM type II.  Recent A1c 5.4.  Sliding scale for now ? ?CBG (last 3)  ?Recent Labs  ?  12/27/21 ?1640 12/27/21 ?2142 12/28/21 ?0632  ?GLUCAP 153* 176* 149*  ? ? ? ?   ? ?Condition - Extremely Guarded ? ?Family Communication  : Daughter bedside on 12/25/2021, 12/26/21 ? ?Code Status :  Full ? ?Consults  : [place] consulting, GI consulting, nephrology, cardiology primary ? ?PUD Prophylaxis : PPI ? ? Procedures  :    ? ?HIDA - Non acute ? ?CT - 1. Cholelithiasis without cholecystitis. 2. Right lower lobe airspace disease concerning for pneumonia. Other scattered areas of bilateral lower lobe consolidation favor atelectasis. 3. Small bilateral pleural effusions, right greater than left. 4. Cardiomegaly. 5.  Aortic Atherosclerosis  ? ?TTE - 1. Left ventricular ejection fraction, by estimation, is 30 to 35%. The left ventricle has moderately decreased function. The left ventricle demonstrates regional wall motion abnormalities (see scoring diagram/findings for description). There is mild concentric left ventricular hypertrophy. Indeterminate diastolic filling due to E-A fusion. Elevated left ventricular end-diastolic pressure.  2. Right ventricular systolic function is mildly reduced. The right  ventricular size is moderately enlarged. There is moderately elevated pulmonary artery systolic pressure.  3. Left atrial size was severely dilated.  4. Right atrial size was moderately dilated.  5. The mitral valve is abnormal. Trivial mitral valve regurgitation. Moderate mitral stenosis. The mean mitral valve gradient is 6.5 mmHg.  6. Tricuspid valve regurgitation is moderate.  7. The aortic valve is tricuspid. There is mild calcification of the aortic valve. There is mild thickening of the aortic valve. Aortic valve regurgitation is trivial. Aortic valve sclerosis is present, with no evidence of aortic valve stenosis.  8. The inferior vena cava is dilated in size with >50% respiratory variability, suggesting right atrial pressure of 8 mmHg. Comparison(s): Prior images reviewed side by side. Changes from prior study are noted. Conclusion(s)/Recommendation(s): Reduced LVEF with new wall motion abnormalities. No LV thrombus. Elevated LVEDP. Findings communicated to Dr. Acie Fredrickson. ? ?   ? ?Disposition Plan  :   ? ?Status is: Inpatient ? ? ?DVT Prophylaxis  :  Hep gtt ? ?Lab Results  ?Component Value Date  ? PLT 151 12/28/2021  ? ? ?Diet :  ?Diet Order   ? ?       ?  Diet Heart Room service appropriate? Yes; Fluid consistency: Thin  Diet effective now       ?  ? ?  ?  ? ?  ?  ? ?Inpatient Medications ? ?Scheduled Meds: ? amLODipine  5 mg Oral Daily  ? aspirin EC  81 mg Oral Daily  ? atorvastatin  80 mg Oral Daily  ? azithromycin  500 mg Oral Daily  ? fluticasone furoate-vilanterol  1 puff Inhalation Daily  ? hydrALAZINE  25 mg Oral TID  ? insulin aspart  0-5 Units Subcutaneous QHS  ? insulin aspart  0-9 Units Subcutaneous TID WC  ? latanoprost  1 drop Both Eyes QHS  ? montelukast  10 mg Oral QHS  ? ondansetron (ZOFRAN) IV  4 mg  Intravenous Q6H  ? pantoprazole (PROTONIX) IV  40 mg Intravenous Q12H  ? sodium chloride flush  3 mL Intravenous Q12H  ? ?Continuous Infusions: ? sodium chloride 10 mL/hr at 12/23/21 1053  ?  ampicillin-sulbactam (UNASYN) IV 3 g (12/28/21 0902)  ? ferric gluconate (FERRLECIT) IVPB    ? heparin 1,100 Units/hr (12/27/21 1716)  ? nitroGLYCERIN 25 mcg/min (12/28/21 0513)  ? ?PRN Meds:.acetaminophen, albuterol, albuterol, bisacodyl, metoCLOPramide (REGLAN) injection, nitroGLYCERIN, white petrolatum ? ?Antibiotics  :   ? ?Anti-infectives (From admission, onward)  ? ? Start     Dose/Rate Route Frequency Ordered Stop  ? 12/25/21 2000  Ampicillin-Sulbactam (UNASYN) 3 g in sodium chloride 0.9 % 100 mL IVPB       ? 3 g ?200 mL/hr over 30 Minutes Intravenous Every 12 hours 12/25/21 1902    ? 12/24/21 1715  cefTRIAXone (ROCEPHIN) 1 g in sodium chloride 0.9 % 100 mL IVPB  Status:  Discontinued       ? 1 g ?200 mL/hr over 30 Minutes Intravenous Every 24 hours 12/24/21 1616 12/25/21 1902  ? 12/24/21 1715  azithromycin (ZITHROMAX) tablet 500 mg       ? 500 mg Oral Daily 12/24/21 1616    ? ?  ? ? ? Time Spent in minutes  30 ? ? ?Lala Lund M.D on 12/28/2021 at 9:35 AM ? ?To page go to [email or web address]  ? ?[facility] -  Office  [phone] ? ?See all Orders from today for further details ? ? ? Objective:  ? ?Vitals:  ? 12/28/21 0103 12/28/21 0814 12/28/21 4818 12/28/21 0803  ?BP:  123/73 116/65 116/65  ?Pulse:  61 62 62  ?Resp:  (!) 24 20 (!) 21  ?Temp:  98.2 ?F (36.8 ?C) 98 ?F (36.7 ?C)   ?TempSrc:  Oral Oral   ?SpO2: 100% 98% 98% 95%  ?Weight:      ?Height:      ? ? ?Wt Readings from Last 3 Encounters:  ?12/26/21 80.9 kg  ?12/12/21 82.5 kg  ?11/04/21 83.1 kg  ? ? ? ?Intake/Output Summary (Last 24 hours) at 12/28/2021 0935 ?Last data filed at 12/28/2021 0600 ?Gross per 24 hour  ?Intake 2485.02 ml  ?Output 950 ml  ?Net 1535.02 ml  ? ? ? ?Physical Exam ? ?Awake Alert, No new F.N deficits, Normal affect ?[place].AT,PERRAL ?Supple Neck, No JVD,   ?Symmetrical Chest wall movement, Good air movement bilaterally, CTAB ?RRR,No Gallops, Rubs or new Murmurs,  ?+ve B.Sounds, Abd Soft, No tenderness,   ?No Cyanosis, Clubbing or  edema  ?  ? ? Data Review:  ? ? ?CBC ?Recent Labs  ?Lab 12/24/21 ?0018 12/25/21 ?0017 12/26/21 ?0139 12/27/21 ?5631 12/28/21 ?0109  ?WBC 11.7* 12.6* 12.0* 11.5* 10.0  ?HGB 12.0 10.8* 9.7* 9.7* 9.3*  ?HCT 34.9* 32.1*

## 2021-12-28 NOTE — Interval H&P Note (Signed)
History and Physical Interval Note: ? ?12/28/2021 ?1:13 PM ? ?[name]  has presented today for surgery, with the diagnosis of nstemi.  The various methods of treatment have been discussed with the patient and family. After consideration of risks, benefits and other options for treatment, the patient has consented to  Procedure(s): ?RIGHT/LEFT HEART CATH AND CORONARY ANGIOGRAPHY (N/A) as a surgical intervention.  The patient's history has been reviewed, patient examined, no change in status, stable for surgery.  I have reviewed the patient's chart and labs.  Questions were answered to the patient's satisfaction.   ? ?Cath Lab Visit (complete for each Cath Lab visit) ? ?Clinical Evaluation Leading to the Procedure:  ? ?ACS: Yes.   ? ?Non-ACS:   ? ?Anginal Classification: CCS III ? ?Anti-ischemic medical therapy: Minimal Therapy (1 class of medications) ? ?Non-Invasive Test Results: No non-invasive testing performed ? ?Prior CABG: No previous CABG ? ? ? ? ? ? ? ?[name] ? ? ?

## 2021-12-28 NOTE — H&P (View-Only) (Signed)
? ?Progress Note ? ?Patient Name: [name] ?Date of Encounter: 12/28/2021 ? ?[facility] Cardiologist: Dorris Carnes, MD  ? ?Subjective  ? ?Patient had 3 bowel movement yesterday and felt much better afterwards.  This morning around 4 AM she again had left lower sternal chest pressure.  Resolved after uptitrating nitroglycerin x2.  Currently chest pain-free.  Renal function has worsened.  Breathing stable. ? ?Inpatient Medications  ?  ?Scheduled Meds: ? amLODipine  5 mg Oral Daily  ? aspirin EC  81 mg Oral Daily  ? atorvastatin  80 mg Oral Daily  ? azithromycin  500 mg Oral Daily  ? fluticasone furoate-vilanterol  1 puff Inhalation Daily  ? hydrALAZINE  25 mg Oral TID  ? insulin aspart  0-5 Units Subcutaneous QHS  ? insulin aspart  0-9 Units Subcutaneous TID WC  ? latanoprost  1 drop Both Eyes QHS  ? montelukast  10 mg Oral QHS  ? ondansetron (ZOFRAN) IV  4 mg Intravenous Q6H  ? pantoprazole (PROTONIX) IV  40 mg Intravenous Q12H  ? sodium chloride flush  3 mL Intravenous Q12H  ? ?Continuous Infusions: ? sodium chloride 10 mL/hr at 12/23/21 1053  ? ampicillin-sulbactam (UNASYN) IV 3 g (12/27/21 2007)  ? heparin 1,100 Units/hr (12/27/21 1716)  ? nitroGLYCERIN 25 mcg/min (12/28/21 0513)  ? ?PRN Meds: ?acetaminophen, albuterol, albuterol, bisacodyl, metoCLOPramide (REGLAN) injection, nitroGLYCERIN, white petrolatum  ? ?Vital Signs  ?  ?Vitals:  ? 12/28/21 0103 12/28/21 8413 12/28/21 2440 12/28/21 0803  ?BP:  123/73 116/65 116/65  ?Pulse:  61 62 62  ?Resp:  (!) 24 20 (!) 21  ?Temp:  98.2 ?F (36.8 ?C) 98 ?F (36.7 ?C)   ?TempSrc:  Oral Oral   ?SpO2: 100% 98% 98% 95%  ?Weight:      ?Height:      ? ? ?Intake/Output Summary (Last 24 hours) at 12/28/2021 0817 ?Last data filed at 12/28/2021 0600 ?Gross per 24 hour  ?Intake 2485.02 ml  ?Output 950 ml  ?Net 1535.02 ml  ? ? ?  12/26/2021  ?  6:40 AM 12/23/2021  ? 10:25 AM 12/23/2021  ?  4:09 AM  ?Last 3 Weights  ?Weight (lbs) 178 lb 5.6 oz 181 lb 181 lb  ?Weight (kg) 80.9 kg 82.101  kg 82.101 kg  ?   ? ?Telemetry  ?  ?Paced rhythm- Personally Reviewed ? ?ECG  ?  ?N/A ? ?Physical Exam  ? ?GEN: No acute distress.   ?Neck: No JVD ?Cardiac: RRR, no murmurs, rubs, or gallops.  ?Respiratory: Clear to auscultation bilaterally. ?GI: Soft, nontender, non-distended  ?MS: No edema; No deformity. ?Neuro:  Nonfocal  ?Psych: Normal affect  ? ?Labs  ?  ?High Sensitivity Troponin:   ?Recent Labs  ?Lab 12/23/21 ?0426 12/23/21 ?0617 12/27/21 ?1027 12/27/21 ?1118  ?TROPONINIHS 187* 346* 2,797* 2,716*  ?   ?Chemistry ?Recent Labs  ?Lab 12/26/21 ?0139 12/27/21 ?2536 12/28/21 ?0109  ?NA 136 137 137  ?K 4.4 4.5 4.0  ?CL 111 108 106  ?CO2 18* 18* 18*  ?GLUCOSE 153* 180* 148*  ?BUN 54* 59* 62*  ?CREATININE 3.17* 3.21* 3.49*  ?CALCIUM 8.5* 9.0 8.9  ?MG 2.0 2.2 2.1  ?PROT 6.0* 6.5 6.1*  ?ALBUMIN 2.9* 3.2* 3.0*  ?AST 33 36 44*  ?ALT 20 28 38  ?ALKPHOS 65 85 84  ?BILITOT 1.6* 1.7* 1.7*  ?GFRNONAA 14* 14* 12*  ?ANIONGAP 7 11 13   ?  ?Lipids  ?Recent Labs  ?Lab 12/23/21 ?1030  ?CHOL 120  ?  TRIG 46  ?HDL 55  ?[place] 56  ?CHOLHDL 2.2  ?  ?Hematology ?Recent Labs  ?Lab 12/26/21 ?0139 12/27/21 ?8309 12/28/21 ?0109  ?WBC 12.0* 11.5* 10.0  ?RBC 3.51* 3.54* 3.36*  ?HGB 9.7* 9.7* 9.3*  ?HCT 27.9* 28.2* 26.9*  ?MCV 79.5* 79.7* 80.1  ?MCH 27.6 27.4 27.7  ?MCHC 34.8 34.4 34.6  ?RDW 15.3 15.3 15.9*  ?PLT 124* 147* 151  ? ?Thyroid  ?Recent Labs  ?Lab 12/23/21 ?1030  ?TSH 3.434  ?FREET4 1.15*  ?  ?BNP ?Recent Labs  ?Lab 12/26/21 ?0139 12/27/21 ?4076 12/28/21 ?0109  ?BNP 1,327.6* 1,711.2* 1,969.3*  ?  ? ?Radiology  ?  ?No results found. ? ?Cardiac Studies  ? ?Echo 12/23/21 ? 1. Left ventricular ejection fraction, by estimation, is 30 to 35%. The  ?left ventricle has moderately decreased function. The left ventricle  ?demonstrates regional wall motion abnormalities (see scoring  ?diagram/findings for description). There is mild  ?concentric left ventricular hypertrophy. Indeterminate diastolic filling  ?due to E-A fusion. Elevated left  ventricular end-diastolic pressure.  ? 2. Right ventricular systolic function is mildly reduced. The right  ?ventricular size is moderately enlarged. There is moderately elevated  ?pulmonary artery systolic pressure.  ? 3. Left atrial size was severely dilated.  ? 4. Right atrial size was moderately dilated.  ? 5. The mitral valve is abnormal. Trivial mitral valve regurgitation.  ?Moderate mitral stenosis. The mean mitral valve gradient is 6.5 mmHg.  ? 6. Tricuspid valve regurgitation is moderate.  ? 7. The aortic valve is tricuspid. There is mild calcification of the  ?aortic valve. There is mild thickening of the aortic valve. Aortic valve  ?regurgitation is trivial. Aortic valve sclerosis is present, with no  ?evidence of aortic valve stenosis.  ? 8. The inferior vena cava is dilated in size with >50% respiratory  ?variability, suggesting right atrial pressure of 8 mmHg.  ? ?Comparison(s): Prior images reviewed side by side. Changes from prior  ?study are noted.  ? ?Patient Profile  ?   ?86 y.o. female diabetes mellitus, hyperlipidemia, complete heart block s/p pacemaker, paroxysmal atrial fibrillation/flutter and mod CAD (CT in 2017 with calcium scoreof  51  mod dz LAD; filling defect in LAA), and chronic kidney disease stage IV admitted for chest pain.  Course complicated by persistent nausea with dry heaves>> being followed by GI. Possible  ? ?Also with worsening renal function>> being followed by nephrology.  Echo with new LV dysfunction. ?  ?Assessment & Plan  ?  ?NSTEMI ?-Patient was not feeling well last week and then started to having chest pain. Hs-troponin 187>>346.  Echo showed newly reduced LV function with wall motion abnormality.  Taken to Cath Lab 3/24 but case aborted due to acute dyspnea with hypoxia.  Now worsening renal function.  Had worsening pain with report troponin 2797>>2716.  Felt like missed troponin peaked in between. ?-Recurrent chest pain this morning which resolved after uptitrating  nitroglycerin x2.  Currently chest pain-free. ?-Discussed with Dr. Harrington Challenger for diagnostic right and left cath with minimal contrast.  Will review with family and nephrology.  ?-Continue aspirin 81 mg daily ?-Continue Lipitor 80 mg daily ?-Continue IV heparin and IV nitro ?  ?2.  Acute systolic heart failure ?-Echocardiogram showed newly reduced LV function at 30 to 35% with regional wall motion abnormality.  Aborted cardiac cath due to acute dyspnea with hypoxia.  Given few doses of Lasix but renal function worsened. Diuretics has been held.  Not grossly volume overloaded on  exam.  BNP 1711 >>given IV lasix 40mg  x 2 yesterday but again scr worsen to 3.49 with higher BNP of 1969.  ?-Patient will need guideline directed medical therapy.  No ACE/ARB/Entresto due to worsening renal function. ?-Continue hydralazine 25 mg 3 times daily ?-Patient on amlodipine 5 mg daily>> Will review with MD to change to beta-blocker at some point. Lungs has improved. No acting wheezing.  ?-Consider SGLT2i  ?  ?3.  Acute on chronic kidney disease stage IV ?- Scr slightly worsened again  ?- Baseline 1.8 ?- as summarized above  ?- Per nephrology  ?  ?4. Possible pneumonia ?5. COPD ?- On Abx ?- Appreciate IM help ?  ?6. DM ?- A1c 5.4 ?- On SSI  ?  ?7.  Persistent nausea/constipation/tender to palpation left upper quadrant (previously right per note) ?-Appreciate of GI and IM assistance ?- Felt better after BM x 3 yesterday  ? ?For questions or updates, please contact [place] ?Please consult [email or web address] for contact info under  ? ?  ?   ?Signed, ?Leanor Kail, PA  ?12/28/2021, 8:17 AM    ? ?Patient seen and examined   Agree with findings of B Bhagat above  ?PT continues to have CP    Given this and decreased LV function feel proceeding with cath is indicated   Reviewed risks /benefits of waiting /proceeding  PT understands issues   Agrees to proceed ?ON exam    ?Pt curr pain free ?Lungs are CTA ?Cardiac RRR  NO S3    ?Abd is supple   ?Ext are with triv edema ? ?Will reivew with interventional service     ? ?Dorris Carnes MD  ?

## 2021-12-28 NOTE — Plan of Care (Signed)

## 2021-12-28 NOTE — Progress Notes (Signed)
ANTICOAGULATION CONSULT NOTE ? ?Pharmacy Consult for Heparin ?Indication: chest pain/ACS ? ?Allergies  ?Allergen Reactions  ? Aspirin Shortness Of Breath and Other (See Comments)  ?  Caused asthma symptoms  ? Ramipril Cough  ? ? ?Patient Measurements: ?Height: 5\' 7"  (170.2 cm) ?Weight: 80.9 kg (178 lb 5.6 oz) ?IBW/kg (Calculated) : 61.6 ?Heparin Dosing Weight: 75 kg ? ?Vital Signs: ?Temp: 98 ?F (36.7 ?C) (03/29 1610) ?Temp Source: Oral (03/29 0731) ?BP: 116/65 (03/29 0803) ?Pulse Rate: 62 (03/29 0803) ? ?Labs: ?Recent Labs  ?  12/26/21 ?0139 12/27/21 ?9604 12/27/21 ?5409 12/27/21 ?1118 12/28/21 ?0109  ?HGB 9.7* 9.7*  --   --  9.3*  ?HCT 27.9* 28.2*  --   --  26.9*  ?PLT 124* 147*  --   --  151  ?APTT 62* 57*  --   --  65*  ?HEPARINUNFRC 0.72* 0.68  --   --  0.72*  ?CREATININE 3.17* 3.21*  --   --  3.49*  ?TROPONINIHS  --   --  2,797* 2,716*  --   ? ? ? ?Estimated Creatinine Clearance: 13.4 mL/min (A) (by C-G formula based on SCr of 3.49 mg/dL (H)). ? ?  ? ?Assessment: ?86 y.o. female with chest pain to continue on IV heparin.  Patient was on Eliquis PTA for history of Afib, so will use aPTT to guide heparin dosing until aPTT and heparin level correlate.  Last dose of Eliquis was on 3/23 at 1200. ? ?aPTT just below goal at 65 seconds; heparin level elevated as expected from recent Eliquis. Very slow clearance of apixaban likely due to renal issues. No issues reported. Hemoglobin stable overnight. Cardiology planning cath.  ? ?Goal of Therapy:  ?Heparin level 0.3-0.7 units/ml ?Monitor platelets by anticoagulation protocol: Yes ?  ?Plan:  ?Increase heparin infusion to 1200 units/hr ?Daily aPTT, heparin level and CBC. ?F/u plans to resume Eliquis eventually after procedures are complete. ? ?Erin Hearing PharmD., BCPS ?Clinical Pharmacist ?12/28/2021 9:35 AM ? ?

## 2021-12-28 NOTE — Progress Notes (Addendum)
? ?Progress Note ? ?Patient Name: [name] ?Date of Encounter: 12/28/2021 ? ?[facility] Cardiologist: Dorris Carnes, MD  ? ?Subjective  ? ?Patient had 3 bowel movement yesterday and felt much better afterwards.  This morning around 4 AM she again had left lower sternal chest pressure.  Resolved after uptitrating nitroglycerin x2.  Currently chest pain-free.  Renal function has worsened.  Breathing stable. ? ?Inpatient Medications  ?  ?Scheduled Meds: ? amLODipine  5 mg Oral Daily  ? aspirin EC  81 mg Oral Daily  ? atorvastatin  80 mg Oral Daily  ? azithromycin  500 mg Oral Daily  ? fluticasone furoate-vilanterol  1 puff Inhalation Daily  ? hydrALAZINE  25 mg Oral TID  ? insulin aspart  0-5 Units Subcutaneous QHS  ? insulin aspart  0-9 Units Subcutaneous TID WC  ? latanoprost  1 drop Both Eyes QHS  ? montelukast  10 mg Oral QHS  ? ondansetron (ZOFRAN) IV  4 mg Intravenous Q6H  ? pantoprazole (PROTONIX) IV  40 mg Intravenous Q12H  ? sodium chloride flush  3 mL Intravenous Q12H  ? ?Continuous Infusions: ? sodium chloride 10 mL/hr at 12/23/21 1053  ? ampicillin-sulbactam (UNASYN) IV 3 g (12/27/21 2007)  ? heparin 1,100 Units/hr (12/27/21 1716)  ? nitroGLYCERIN 25 mcg/min (12/28/21 0513)  ? ?PRN Meds: ?acetaminophen, albuterol, albuterol, bisacodyl, metoCLOPramide (REGLAN) injection, nitroGLYCERIN, white petrolatum  ? ?Vital Signs  ?  ?Vitals:  ? 12/28/21 0103 12/28/21 2229 12/28/21 7989 12/28/21 0803  ?BP:  123/73 116/65 116/65  ?Pulse:  61 62 62  ?Resp:  (!) 24 20 (!) 21  ?Temp:  98.2 ?F (36.8 ?C) 98 ?F (36.7 ?C)   ?TempSrc:  Oral Oral   ?SpO2: 100% 98% 98% 95%  ?Weight:      ?Height:      ? ? ?Intake/Output Summary (Last 24 hours) at 12/28/2021 0817 ?Last data filed at 12/28/2021 0600 ?Gross per 24 hour  ?Intake 2485.02 ml  ?Output 950 ml  ?Net 1535.02 ml  ? ? ?  12/26/2021  ?  6:40 AM 12/23/2021  ? 10:25 AM 12/23/2021  ?  4:09 AM  ?Last 3 Weights  ?Weight (lbs) 178 lb 5.6 oz 181 lb 181 lb  ?Weight (kg) 80.9 kg 82.101  kg 82.101 kg  ?   ? ?Telemetry  ?  ?Paced rhythm- Personally Reviewed ? ?ECG  ?  ?N/A ? ?Physical Exam  ? ?GEN: No acute distress.   ?Neck: No JVD ?Cardiac: RRR, no murmurs, rubs, or gallops.  ?Respiratory: Clear to auscultation bilaterally. ?GI: Soft, nontender, non-distended  ?MS: No edema; No deformity. ?Neuro:  Nonfocal  ?Psych: Normal affect  ? ?Labs  ?  ?High Sensitivity Troponin:   ?Recent Labs  ?Lab 12/23/21 ?0426 12/23/21 ?0617 12/27/21 ?2119 12/27/21 ?1118  ?TROPONINIHS 187* 346* 2,797* 2,716*  ?   ?Chemistry ?Recent Labs  ?Lab 12/26/21 ?0139 12/27/21 ?4174 12/28/21 ?0109  ?NA 136 137 137  ?K 4.4 4.5 4.0  ?CL 111 108 106  ?CO2 18* 18* 18*  ?GLUCOSE 153* 180* 148*  ?BUN 54* 59* 62*  ?CREATININE 3.17* 3.21* 3.49*  ?CALCIUM 8.5* 9.0 8.9  ?MG 2.0 2.2 2.1  ?PROT 6.0* 6.5 6.1*  ?ALBUMIN 2.9* 3.2* 3.0*  ?AST 33 36 44*  ?ALT 20 28 38  ?ALKPHOS 65 85 84  ?BILITOT 1.6* 1.7* 1.7*  ?GFRNONAA 14* 14* 12*  ?ANIONGAP 7 11 13   ?  ?Lipids  ?Recent Labs  ?Lab 12/23/21 ?1030  ?CHOL 120  ?  TRIG 46  ?HDL 55  ?[place] 56  ?CHOLHDL 2.2  ?  ?Hematology ?Recent Labs  ?Lab 12/26/21 ?0139 12/27/21 ?1017 12/28/21 ?0109  ?WBC 12.0* 11.5* 10.0  ?RBC 3.51* 3.54* 3.36*  ?HGB 9.7* 9.7* 9.3*  ?HCT 27.9* 28.2* 26.9*  ?MCV 79.5* 79.7* 80.1  ?MCH 27.6 27.4 27.7  ?MCHC 34.8 34.4 34.6  ?RDW 15.3 15.3 15.9*  ?PLT 124* 147* 151  ? ?Thyroid  ?Recent Labs  ?Lab 12/23/21 ?1030  ?TSH 3.434  ?FREET4 1.15*  ?  ?BNP ?Recent Labs  ?Lab 12/26/21 ?0139 12/27/21 ?5102 12/28/21 ?0109  ?BNP 1,327.6* 1,711.2* 1,969.3*  ?  ? ?Radiology  ?  ?No results found. ? ?Cardiac Studies  ? ?Echo 12/23/21 ? 1. Left ventricular ejection fraction, by estimation, is 30 to 35%. The  ?left ventricle has moderately decreased function. The left ventricle  ?demonstrates regional wall motion abnormalities (see scoring  ?diagram/findings for description). There is mild  ?concentric left ventricular hypertrophy. Indeterminate diastolic filling  ?due to E-A fusion. Elevated left  ventricular end-diastolic pressure.  ? 2. Right ventricular systolic function is mildly reduced. The right  ?ventricular size is moderately enlarged. There is moderately elevated  ?pulmonary artery systolic pressure.  ? 3. Left atrial size was severely dilated.  ? 4. Right atrial size was moderately dilated.  ? 5. The mitral valve is abnormal. Trivial mitral valve regurgitation.  ?Moderate mitral stenosis. The mean mitral valve gradient is 6.5 mmHg.  ? 6. Tricuspid valve regurgitation is moderate.  ? 7. The aortic valve is tricuspid. There is mild calcification of the  ?aortic valve. There is mild thickening of the aortic valve. Aortic valve  ?regurgitation is trivial. Aortic valve sclerosis is present, with no  ?evidence of aortic valve stenosis.  ? 8. The inferior vena cava is dilated in size with >50% respiratory  ?variability, suggesting right atrial pressure of 8 mmHg.  ? ?Comparison(s): Prior images reviewed side by side. Changes from prior  ?study are noted.  ? ?Patient Profile  ?   ?86 y.o. female diabetes mellitus, hyperlipidemia, complete heart block s/p pacemaker, paroxysmal atrial fibrillation/flutter and mod CAD (CT in 2017 with calcium scoreof  51  mod dz LAD; filling defect in LAA), and chronic kidney disease stage IV admitted for chest pain.  Course complicated by persistent nausea with dry heaves>> being followed by GI. Possible  ? ?Also with worsening renal function>> being followed by nephrology.  Echo with new LV dysfunction. ?  ?Assessment & Plan  ?  ?NSTEMI ?-Patient was not feeling well last week and then started to having chest pain. Hs-troponin 187>>346.  Echo showed newly reduced LV function with wall motion abnormality.  Taken to Cath Lab 3/24 but case aborted due to acute dyspnea with hypoxia.  Now worsening renal function.  Had worsening pain with report troponin 2797>>2716.  Felt like missed troponin peaked in between. ?-Recurrent chest pain this morning which resolved after uptitrating  nitroglycerin x2.  Currently chest pain-free. ?-Discussed with Dr. Harrington Challenger for diagnostic right and left cath with minimal contrast.  Will review with family and nephrology.  ?-Continue aspirin 81 mg daily ?-Continue Lipitor 80 mg daily ?-Continue IV heparin and IV nitro ?  ?2.  Acute systolic heart failure ?-Echocardiogram showed newly reduced LV function at 30 to 35% with regional wall motion abnormality.  Aborted cardiac cath due to acute dyspnea with hypoxia.  Given few doses of Lasix but renal function worsened. Diuretics has been held.  Not grossly volume overloaded on  exam.  BNP 1711 >>given IV lasix 40mg  x 2 yesterday but again scr worsen to 3.49 with higher BNP of 1969.  ?-Patient will need guideline directed medical therapy.  No ACE/ARB/Entresto due to worsening renal function. ?-Continue hydralazine 25 mg 3 times daily ?-Patient on amlodipine 5 mg daily>> Will review with MD to change to beta-blocker at some point. Lungs has improved. No acting wheezing.  ?-Consider SGLT2i  ?  ?3.  Acute on chronic kidney disease stage IV ?- Scr slightly worsened again  ?- Baseline 1.8 ?- as summarized above  ?- Per nephrology  ?  ?4. Possible pneumonia ?5. COPD ?- On Abx ?- Appreciate IM help ?  ?6. DM ?- A1c 5.4 ?- On SSI  ?  ?7.  Persistent nausea/constipation/tender to palpation left upper quadrant (previously right per note) ?-Appreciate of GI and IM assistance ?- Felt better after BM x 3 yesterday  ? ?For questions or updates, please contact [place] ?Please consult [email or web address] for contact info under  ? ?  ?   ?Signed, ?Leanor Kail, PA  ?12/28/2021, 8:17 AM    ? ?Patient seen and examined   Agree with findings of B Bhagat above  ?PT continues to have CP    Given this and decreased LV function feel proceeding with cath is indicated   Reviewed risks /benefits of waiting /proceeding  PT understands issues   Agrees to proceed ?ON exam    ?Pt curr pain free ?Lungs are CTA ?Cardiac RRR  NO S3    ?Abd is supple   ?Ext are with triv edema ? ?Will reivew with interventional service     ? ?Dorris Carnes MD  ?

## 2021-12-28 NOTE — Progress Notes (Signed)
Pt had a 7 beat run of Vtach at 1725, she is asymptomatic. Pa Bhagat made aware at 1738, no new orders at this time, will continue to monitor.   ?

## 2021-12-28 NOTE — Progress Notes (Signed)
Subjective:  hemodynamically stable overnight-  950 of UOP-  weight down.   BUN and crt up just a little again. Had a better night, abd pain better, breathing better- but still with CP  ? ?Objective ?Vital signs in last 24 hours: ?Vitals:  ? 12/28/21 0103 12/28/21 0351 12/28/21 0731 12/28/21 0803  ?BP:  123/73 116/65 116/65  ?Pulse:  61 62 62  ?Resp:  (!) 24 20 (!) 21  ?Temp:  98.2 ?F (36.8 ?C) 98 ?F (36.7 ?C)   ?TempSrc:  Oral Oral   ?SpO2: 100% 98% 98% 95%  ?Weight:      ?Height:      ? ?Weight change:  ? ?Intake/Output Summary (Last 24 hours) at 12/28/2021 0813 ?Last data filed at 12/28/2021 0600 ?Gross per 24 hour  ?Intake 2485.02 ml  ?Output 950 ml  ?Net 1535.02 ml  ? ? ?Assessment/Plan: 86 year old BF with multiple medical issues to include CKD-  crt in mid to high 1's-  admit with CP and decreased EF but many other hospital complications as well including A on CRF ?1.Renal- baseline stage 4 CKD but had been fairly stable-  now with A on CRF in the setting of decreased EF and soft BP-  u/s negative- urine pretty bland-  no protein.  feel that A on crf is hemodynamically mediated with low BP this admit.  Dr. Caryl Comes has decreased both her amlodipine and hydralazine appropriately-  crt was trying to plateau it seemed after worsening but now seems to be worsening again.  No indications for RRT.  I know cardiology is waiting for renal function to get better so she can get a heart cath-  we might be getting to the point that it just needs to be done since having CP on nitro drip-  family understands and is willing to take the risk from my conversation with them ?2. Hypertension/volume  - She was getting fluids 3/27 but I gave her lasix yesterday based on the appearance of her CXR.  Her BP did drop earlier - I agree with the modification of BP meds to date-  allowing her BP to drift up some-  is better.  giving lasix PRN-  not today  ?3. Anemia  - hgb just under 10-   iron stores low, will replete  ?4. ID-  thought to  have PNA- on Unasyn and azithro  ?5. Nausea-  thought to be just residual from her other illness -  reglan and antiemetics  ? ? ?[name]  ? ? ?Labs: ?Basic Metabolic Panel: ?Recent Labs  ?Lab 12/26/21 ?0139 12/27/21 ?8921 12/28/21 ?0109  ?NA 136 137 137  ?K 4.4 4.5 4.0  ?CL 111 108 106  ?CO2 18* 18* 18*  ?GLUCOSE 153* 180* 148*  ?BUN 54* 59* 62*  ?CREATININE 3.17* 3.21* 3.49*  ?CALCIUM 8.5* 9.0 8.9  ? ?Liver Function Tests: ?Recent Labs  ?Lab 12/26/21 ?0139 12/27/21 ?1941 12/28/21 ?0109  ?AST 33 36 44*  ?ALT 20 28 38  ?ALKPHOS 65 85 84  ?BILITOT 1.6* 1.7* 1.7*  ?PROT 6.0* 6.5 6.1*  ?ALBUMIN 2.9* 3.2* 3.0*  ? ?Recent Labs  ?Lab 12/23/21 ?0426 12/25/21 ?0017  ?LIPASE 28 31  ? ?No results for input(s): AMMONIA in the last 168 hours. ?CBC: ?Recent Labs  ?Lab 12/24/21 ?0018 12/25/21 ?0017 12/26/21 ?0139 12/27/21 ?7408 12/28/21 ?0109  ?WBC 11.7* 12.6* 12.0* 11.5* 10.0  ?HGB 12.0 10.8* 9.7* 9.7* 9.3*  ?HCT 34.9* 32.1* 27.9* 28.2* 26.9*  ?MCV 79.1*  80.5 79.5* 79.7* 80.1  ?PLT 150 136* 124* 147* 151  ? ?Cardiac Enzymes: ?No results for input(s): CKTOTAL, CKMB, CKMBINDEX, TROPONINI in the last 168 hours. ?CBG: ?Recent Labs  ?Lab 12/27/21 ?0630 12/27/21 ?1133 12/27/21 ?1640 12/27/21 ?2142 12/28/21 ?7903  ?GLUCAP 160* 175* 153* 176* 149*  ? ? ?Iron Studies:  ?Recent Labs  ?  12/27/21 ?0830  ?IRON 44  ?TIBC 263  ?FERRITIN 81  ? ?Studies/Results: ?No results found. ?Medications: ?Infusions: ? sodium chloride 10 mL/hr at 12/23/21 1053  ? ampicillin-sulbactam (UNASYN) IV 3 g (12/27/21 2007)  ? heparin 1,100 Units/hr (12/27/21 1716)  ? nitroGLYCERIN 25 mcg/min (12/28/21 0513)  ? ? ?Scheduled Medications: ? amLODipine  5 mg Oral Daily  ? aspirin EC  81 mg Oral Daily  ? atorvastatin  80 mg Oral Daily  ? azithromycin  500 mg Oral Daily  ? fluticasone furoate-vilanterol  1 puff Inhalation Daily  ? hydrALAZINE  25 mg Oral TID  ? insulin aspart  0-5 Units Subcutaneous QHS  ? insulin aspart  0-9 Units Subcutaneous TID WC  ?  latanoprost  1 drop Both Eyes QHS  ? montelukast  10 mg Oral QHS  ? ondansetron (ZOFRAN) IV  4 mg Intravenous Q6H  ? pantoprazole (PROTONIX) IV  40 mg Intravenous Q12H  ? sodium chloride flush  3 mL Intravenous Q12H  ? ? have reviewed scheduled and prn medications. ? ?Physical Exam: ?General:  looks more comfortable today  ?Heart: RRR ?Lungs: some crackles and wheezes ?Abdomen: soft,  non tender ?Extremities: min peripheral edema  ? ? ? ?12/28/2021,8:13 AM ? LOS: 5 days  ? ?  ? ? ? ? ?

## 2021-12-29 ENCOUNTER — Encounter (HOSPITAL_COMMUNITY): Payer: Self-pay | Admitting: Cardiovascular Disease

## 2021-12-29 DIAGNOSIS — I5021 Acute systolic (congestive) heart failure: Secondary | ICD-10-CM | POA: Diagnosis not present

## 2021-12-29 DIAGNOSIS — N184 Chronic kidney disease, stage 4 (severe): Secondary | ICD-10-CM | POA: Diagnosis not present

## 2021-12-29 DIAGNOSIS — I214 Non-ST elevation (NSTEMI) myocardial infarction: Secondary | ICD-10-CM | POA: Diagnosis not present

## 2021-12-29 DIAGNOSIS — N179 Acute kidney failure, unspecified: Secondary | ICD-10-CM | POA: Diagnosis not present

## 2021-12-29 LAB — POCT I-STAT EG7
Acid-base deficit: 6 mmol/L — ABNORMAL HIGH (ref 0.0–2.0)
Acid-base deficit: 8 mmol/L — ABNORMAL HIGH (ref 0.0–2.0)
Bicarbonate: 17.1 mmol/L — ABNORMAL LOW (ref 20.0–28.0)
Bicarbonate: 19.5 mmol/L — ABNORMAL LOW (ref 20.0–28.0)
Calcium, Ion: 0.93 mmol/L — ABNORMAL LOW (ref 1.15–1.40)
Calcium, Ion: 1.16 mmol/L (ref 1.15–1.40)
HCT: 24 % — ABNORMAL LOW (ref 36.0–46.0)
HCT: 28 % — ABNORMAL LOW (ref 36.0–46.0)
Hemoglobin: 8.2 g/dL — ABNORMAL LOW (ref 12.0–15.0)
Hemoglobin: 9.5 g/dL — ABNORMAL LOW (ref 12.0–15.0)
O2 Saturation: 50 %
O2 Saturation: 50 %
Potassium: 3.3 mmol/L — ABNORMAL LOW (ref 3.5–5.1)
Potassium: 4 mmol/L (ref 3.5–5.1)
Sodium: 139 mmol/L (ref 135–145)
Sodium: 144 mmol/L (ref 135–145)
TCO2: 18 mmol/L — ABNORMAL LOW (ref 22–32)
TCO2: 21 mmol/L — ABNORMAL LOW (ref 22–32)
pCO2, Ven: 33.4 mmHg — ABNORMAL LOW (ref 44–60)
pCO2, Ven: 37.2 mmHg — ABNORMAL LOW (ref 44–60)
pH, Ven: 7.318 (ref 7.25–7.43)
pH, Ven: 7.327 (ref 7.25–7.43)
pO2, Ven: 29 mmHg — CL (ref 32–45)
pO2, Ven: 29 mmHg — CL (ref 32–45)

## 2021-12-29 LAB — CBC
HCT: 26 % — ABNORMAL LOW (ref 36.0–46.0)
Hemoglobin: 8.9 g/dL — ABNORMAL LOW (ref 12.0–15.0)
MCH: 27.4 pg (ref 26.0–34.0)
MCHC: 34.2 g/dL (ref 30.0–36.0)
MCV: 80 fL (ref 80.0–100.0)
Platelets: 145 10*3/uL — ABNORMAL LOW (ref 150–400)
RBC: 3.25 MIL/uL — ABNORMAL LOW (ref 3.87–5.11)
RDW: 15.7 % — ABNORMAL HIGH (ref 11.5–15.5)
WBC: 12 10*3/uL — ABNORMAL HIGH (ref 4.0–10.5)
nRBC: 7.8 % — ABNORMAL HIGH (ref 0.0–0.2)

## 2021-12-29 LAB — COMPREHENSIVE METABOLIC PANEL
ALT: 89 U/L — ABNORMAL HIGH (ref 0–44)
AST: 129 U/L — ABNORMAL HIGH (ref 15–41)
Albumin: 2.9 g/dL — ABNORMAL LOW (ref 3.5–5.0)
Alkaline Phosphatase: 101 U/L (ref 38–126)
Anion gap: 12 (ref 5–15)
BUN: 61 mg/dL — ABNORMAL HIGH (ref 8–23)
CO2: 18 mmol/L — ABNORMAL LOW (ref 22–32)
Calcium: 8.8 mg/dL — ABNORMAL LOW (ref 8.9–10.3)
Chloride: 106 mmol/L (ref 98–111)
Creatinine, Ser: 3.9 mg/dL — ABNORMAL HIGH (ref 0.44–1.00)
GFR, Estimated: 11 mL/min — ABNORMAL LOW (ref >60)
Glucose, Bld: 110 mg/dL — ABNORMAL HIGH (ref 70–99)
Potassium: 4.3 mmol/L (ref 3.5–5.1)
Sodium: 136 mmol/L (ref 135–145)
Total Bilirubin: 1.7 mg/dL — ABNORMAL HIGH (ref 0.3–1.2)
Total Protein: 6.1 g/dL — ABNORMAL LOW (ref 6.5–8.1)

## 2021-12-29 LAB — GLUCOSE, CAPILLARY
Glucose-Capillary: 108 mg/dL — ABNORMAL HIGH (ref 70–99)
Glucose-Capillary: 125 mg/dL — ABNORMAL HIGH (ref 70–99)
Glucose-Capillary: 176 mg/dL — ABNORMAL HIGH (ref 70–99)
Glucose-Capillary: 191 mg/dL — ABNORMAL HIGH (ref 70–99)

## 2021-12-29 LAB — POCT I-STAT 7, (LYTES, BLD GAS, ICA,H+H)
Acid-base deficit: 11 mmol/L — ABNORMAL HIGH (ref 0.0–2.0)
Bicarbonate: 13.9 mmol/L — ABNORMAL LOW (ref 20.0–28.0)
Calcium, Ion: 0.78 mmol/L — CL (ref 1.15–1.40)
HCT: 20 % — ABNORMAL LOW (ref 36.0–46.0)
Hemoglobin: 6.8 g/dL — CL (ref 12.0–15.0)
O2 Saturation: 93 %
Potassium: 2.9 mmol/L — ABNORMAL LOW (ref 3.5–5.1)
Sodium: 148 mmol/L — ABNORMAL HIGH (ref 135–145)
TCO2: 15 mmol/L — ABNORMAL LOW (ref 22–32)
pCO2 arterial: 26.3 mmHg — ABNORMAL LOW (ref 32–48)
pH, Arterial: 7.333 — ABNORMAL LOW (ref 7.35–7.45)
pO2, Arterial: 71 mmHg — ABNORMAL LOW (ref 83–108)

## 2021-12-29 LAB — POCT ACTIVATED CLOTTING TIME: Activated Clotting Time: 281 seconds

## 2021-12-29 LAB — BRAIN NATRIURETIC PEPTIDE: B Natriuretic Peptide: 2207.8 pg/mL — ABNORMAL HIGH (ref 0.0–100.0)

## 2021-12-29 LAB — MAGNESIUM: Magnesium: 2.2 mg/dL (ref 1.7–2.4)

## 2021-12-29 MED ORDER — PANTOPRAZOLE SODIUM 40 MG PO TBEC
40.0000 mg | DELAYED_RELEASE_TABLET | Freq: Two times a day (BID) | ORAL | Status: DC
  Administered 2021-12-29 – 2022-01-04 (×12): 40 mg via ORAL
  Filled 2021-12-29 (×12): qty 1

## 2021-12-29 MED ORDER — FUROSEMIDE 10 MG/ML IJ SOLN
40.0000 mg | Freq: Two times a day (BID) | INTRAMUSCULAR | Status: DC
  Administered 2021-12-29 – 2021-12-30 (×3): 40 mg via INTRAVENOUS
  Filled 2021-12-29 (×2): qty 4

## 2021-12-29 MED ORDER — APIXABAN 2.5 MG PO TABS
2.5000 mg | ORAL_TABLET | Freq: Two times a day (BID) | ORAL | Status: DC
  Administered 2021-12-29 – 2022-01-04 (×13): 2.5 mg via ORAL
  Filled 2021-12-29 (×13): qty 1

## 2021-12-29 NOTE — Evaluation (Signed)
Physical Therapy Evaluation ?Patient Details ?Name: [name] ?MRN: [id number] ?DOB: 24-Jun-1936 ?Today's Date: 12/29/2021 ? ?History of Present Illness ? 86 y.o. female who was transferred to [facility] from [facility] 12/23/21  for continuous onset of chest pain complained about persistent feeling of nausea and frequent dry heaves for 3 days.  Was admitted by cardiology service placed on heparin drip for NSTEMI and hospitalist team was consulted for nausea evaluation. s/p 3/29 Heart Cath and Coronary Angiography PMH: chronic systolic CHF LVEF 48-54%, PAF on Eliquis, IDDM, HTN, HLD, CKD stage IIIb, GERD, COPD Gold stage II, Heart block on PPM,  ?Clinical Impression ? PTA pt living with daughter in single story home with 2 steps to enter. Pt reports independence in limited community ambulation, driving and managing all ADLs and iADLs. Pt is currently limited in safe mobility by decreased strength, balance and activity tolerance. Pt is supervision for bed mobility, and min-modA for transfers and ambulation of 12 feet with RW. Pt and daughter refuse SNF level rehab as daughter states she can get pt into house. Both in agreement with HHPT. PT will continue to follow acutely.   ?   ? ?Recommendations for follow up therapy are one component of a multi-disciplinary discharge planning process, led by the attending physician.  Recommendations may be updated based on patient status, additional functional criteria and insurance authorization. ? ?Follow Up Recommendations Home health PT ? ?  ?Assistance Recommended at Discharge Frequent or constant Supervision/Assistance  ?Patient can return home with the following ? A lot of help with walking and/or transfers;A lot of help with bathing/dressing/bathroom;Assistance with cooking/housework;Assistance with feeding;Direct supervision/assist for medications management;Direct supervision/assist for financial management;Assist for transportation;Help with stairs or ramp for entrance ? ?   ?Equipment Recommendations Rolling walker (2 wheels);BSC/3in1  ?Recommendations for Other Services ? OT consult  ?  ?Functional Status Assessment Patient has had a recent decline in their functional status and demonstrates the ability to make significant improvements in function in a reasonable and predictable amount of time.  ? ?  ?Precautions / Restrictions Precautions ?Precautions: Other (comment) (R radial heart cath,) ?Precaution Comments: No pushing through R arm for 24 hrs ?Restrictions ?Weight Bearing Restrictions: Yes ?RUE Weight Bearing: Non weight bearing ?Other Position/Activity Restrictions: 24 hrs post heart cath  ? ?  ? ?Mobility ? Bed Mobility ?Overal bed mobility: Needs Assistance ?Bed Mobility: Supine to Sit ?  ?  ?Supine to sit: Supervision ?  ?  ?General bed mobility comments: supervision for safety, use of L UE on bed rail to pull to EoB ?  ? ?Transfers ?Overall transfer level: Needs assistance ?Equipment used: 1 person hand held assist ?Transfers: Sit to/from Stand, Bed to chair/wheelchair/BSC ?Sit to Stand: Min assist, Mod assist ?  ?  ?  ?  ?  ?General transfer comment: , requires min-modA for transfer bed to [facility] and back and modA for power up from bed to RW, pt encouraged to grip RW with R hand for steadying and push up from L UE on bed ?  ? ?Ambulation/Gait ?Ambulation/Gait assistance: Min assist, Mod assist ?Gait Distance (Feet): 12 Feet ?Assistive device: Rolling walker (2 wheels) ?Gait Pattern/deviations: Step-through pattern, Decreased step length - right, Decreased step length - left, Trunk flexed, Shuffle ?Gait velocity: slowed ?Gait velocity interpretation: <1.31 ft/sec, indicative of household ambulator ?  ?General Gait Details: min A for straight line ambulation modA for steadying at hips to turn after fatigue, pt with extremely flexed posture, vc for not laying down  on L elbow on RW, pt reports she can not go any further ? ? ?  ? ?Balance Overall balance assessment: Needs  assistance ?Sitting-balance support: Feet supported, No upper extremity supported ?Sitting balance-Leahy Scale: Good ?  ?  ?Standing balance support: Single extremity supported, During functional activity, Reliant on assistive device for balance ?Standing balance-Leahy Scale: Poor ?  ?  ?  ?  ?  ?  ?  ?  ?  ?  ?  ?  ?   ? ? ? ?Pertinent Vitals/Pain Pain Assessment ?Pain Assessment: No/denies pain  ? ? ?Home Living Family/patient expects to be discharged to:: Private residence ?Living Arrangements: Children ?Available Help at Discharge: Family;Available 24 hours/day ?Type of Home: House ?Home Access: Stairs to enter ?  ?Entrance Stairs-Number of Steps: 1 ?  ?Home Layout: One level ?Home Equipment: Kasandra Knudsen - single point ?   ?  ?Prior Function Prior Level of Function : Driving;Independent/Modified Independent ?  ?  ?  ?  ?  ?  ?  ?  ?  ? ? ?   ?Extremity/Trunk Assessment  ? Upper Extremity Assessment ?Upper Extremity Assessment: RUE deficits/detail ?RUE Deficits / Details: keeps R elbow tightly flexed to maintain precautions even with encouragement for AROM ?  ? ?Lower Extremity Assessment ?Lower Extremity Assessment: Generalized weakness ?  ? ?Cervical / Trunk Assessment ?Cervical / Trunk Assessment: Kyphotic  ?Communication  ? Communication: No difficulties  ?Cognition Arousal/Alertness: Awake/alert ?Behavior During Therapy: Flat affect ?Overall Cognitive Status: Within Functional Limits for tasks assessed ?  ?  ?  ?  ?  ?  ?  ?  ?  ?  ?  ?  ?  ?  ?  ?  ?  ?  ?  ? ?  ?General Comments General comments (skin integrity, edema, etc.): Pt on 2L O2 via [place] on entry, pt reports she does not use at baseline SpO2 100%, removed [place] and pt able to maintain SpO2 >92%O2 throughout session, VSS ? ?  ?   ? ?Assessment/Plan  ?  ?PT Assessment Patient needs continued PT services  ?PT Problem List Decreased strength;Decreased activity tolerance;Decreased range of motion;Decreased balance;Decreased mobility;Cardiopulmonary status limiting  activity ? ?   ?  ?PT Treatment Interventions DME instruction;Gait training;Stair training;Functional mobility training;Therapeutic activities;Therapeutic exercise;Balance training;Cognitive remediation;Patient/family education   ? ?PT Goals (Current goals can be found in the Care Plan section)  ?Acute Rehab PT Goals ?Patient Stated Goal: go home ?PT Goal Formulation: With patient/family ?Time For Goal Achievement: 01/13/22 ?Potential to Achieve Goals: Fair ? ?  ?Frequency Min 3X/week ?  ? ? ?   ?AM-PAC PT "6 Clicks" Mobility  ?Outcome Measure Help needed turning from your back to your side while in a flat bed without using bedrails?: None ?Help needed moving from lying on your back to sitting on the side of a flat bed without using bedrails?: None ?Help needed moving to and from a bed to a chair (including a wheelchair)?: A Lot ?Help needed standing up from a chair using your arms (e.g., wheelchair or bedside chair)?: A Lot ?Help needed to walk in hospital room?: A Lot ?Help needed climbing 3-5 steps with a railing? : Total ?6 Click Score: 15 ? ?  ?End of Session Equipment Utilized During Treatment: Gait belt ?Activity Tolerance: Patient limited by fatigue ?Patient left: in chair;with call bell/phone within reach;with family/visitor present ?Nurse Communication: Mobility status ?PT Visit Diagnosis: Unsteadiness on feet (R26.81);Other abnormalities of gait and mobility (R26.89);Muscle weakness (generalized) (  M62.81);Difficulty in walking, not elsewhere classified (R26.2) ?  ? ?Time: 9923-4144 ?PT Time Calculation (min) (ACUTE ONLY): 24 min ? ? ?Charges:   PT Evaluation ?$PT Eval Moderate Complexity: 1 Mod ?PT Treatments ?$Gait Training: 8-22 mins ?  ?   ? ? ?Devaney Segers B. Migdalia Dk PT, DPT ?Acute Rehabilitation Services ?Pager [phone] ?Office [phone] ? ? ?[place] ?12/29/2021, 4:39 PM ? ?

## 2021-12-29 NOTE — Plan of Care (Signed)

## 2021-12-29 NOTE — Progress Notes (Signed)
Subjective:  ended up getting a heart cath with intervention yesterday -  looks and feels better -  only 100 of UOP but patient and daughter say much more, had given her some lasix based on inc wedge with cath yesterday  -- no weight today either-  crt up but not significantly -   ? ?Objective ?Vital signs in last 24 hours: ?Vitals:  ? 12/28/21 1636 12/28/21 2028 12/28/21 2352 12/29/21 0425  ?BP: (!) 143/70 128/66 129/74 133/67  ?Pulse:  60 66 60  ?Resp:  20 19 20   ?Temp:  98.6 ?F (37 ?C) 97.9 ?F (36.6 ?C) 98.1 ?F (36.7 ?C)  ?TempSrc:  Oral Oral Oral  ?SpO2:  100% 99% 99%  ?Weight:      ?Height:      ? ?Weight change:  ? ?Intake/Output Summary (Last 24 hours) at 12/29/2021 0757 ?Last data filed at 12/29/2021 0400 ?Gross per 24 hour  ?Intake 1617.85 ml  ?Output 100 ml  ?Net 1517.85 ml  ? ? ?Assessment/Plan: 86 year old BF with multiple medical issues to include CKD-  crt in mid to high 1's-  admit with CP and decreased EF but many other hospital complications as well including A on CRF ?1.Renal- baseline stage 4 CKD but had been fairly stable-  now with A on CRF in the setting of decreased EF and soft BP-  u/s negative- urine pretty bland-  no protein.  feel that A on crf is hemodynamically mediated with low BP this admit.  Dr. Caryl Comes had decreased both her amlodipine and hydralazine appropriately-  crt was trying to plateau it seemed after worsening but then worsened again-   then with dye load 3/29.  No indications for RRT.and I really hope that we will be able to avoid it in her  ?2. Hypertension/volume  - She was getting fluids 3/27 but I gave her lasix 3/29 based on the appearance of her CXR.  Her BP did drop earlier - then repeated yest due to findings at cardiac cath-  will stop amlodipine and continue lasix for now ?3. Anemia  - hgb just under 10-   iron stores low, repleting ?4. ID-  thought to have PNA- on Unasyn and azithro  ?5. Nausea-  thought to be just residual from her other illness -  reglan and  antiemetics -  better ? ? ?Louis Meckel  ? ? ?Labs: ?Basic Metabolic Panel: ?Recent Labs  ?Lab 12/27/21 ?6720 12/28/21 ?0109 12/28/21 ?1357 12/28/21 ?1406 12/29/21 ?9470  ?NA 137 137 144 139 136  ?K 4.5 4.0 3.3* 4.0 4.3  ?CL 108 106  --   --  106  ?CO2 18* 18*  --   --  18*  ?GLUCOSE 180* 148*  --   --  110*  ?BUN 59* 62*  --   --  61*  ?CREATININE 3.21* 3.49*  --   --  3.90*  ?CALCIUM 9.0 8.9  --   --  8.8*  ? ?Liver Function Tests: ?Recent Labs  ?Lab 12/27/21 ?9628 12/28/21 ?0109 12/29/21 ?3662  ?AST 36 44* 129*  ?ALT 28 38 89*  ?ALKPHOS 85 84 101  ?BILITOT 1.7* 1.7* 1.7*  ?PROT 6.5 6.1* 6.1*  ?ALBUMIN 3.2* 3.0* 2.9*  ? ?Recent Labs  ?Lab 12/23/21 ?0426 12/25/21 ?0017  ?LIPASE 28 31  ? ?No results for input(s): AMMONIA in the last 168 hours. ?CBC: ?Recent Labs  ?Lab 12/25/21 ?0017 12/26/21 ?0139 12/27/21 ?9476 12/28/21 ?0109 12/28/21 ?1357 12/28/21 ?1406 12/29/21 ?5465  ?  WBC 12.6* 12.0* 11.5* 10.0  --   --  12.0*  ?HGB 10.8* 9.7* 9.7* 9.3* 8.2* 9.5* 8.9*  ?HCT 32.1* 27.9* 28.2* 26.9* 24.0* 28.0* 26.0*  ?MCV 80.5 79.5* 79.7* 80.1  --   --  80.0  ?PLT 136* 124* 147* 151  --   --  145*  ? ?Cardiac Enzymes: ?No results for input(s): CKTOTAL, CKMB, CKMBINDEX, TROPONINI in the last 168 hours. ?CBG: ?Recent Labs  ?Lab 12/28/21 ?9030 12/28/21 ?1136 12/28/21 ?1605 12/28/21 ?2109 12/29/21 ?0923  ?GLUCAP 149* 147* 151* 146* 108*  ? ? ?Iron Studies:  ?Recent Labs  ?  12/27/21 ?0830  ?IRON 44  ?TIBC 263  ?FERRITIN 81  ? ?Studies/Results: ?CARDIAC CATHETERIZATION ? ?Result Date: 12/28/2021 ?  Mid RCA lesion is 30% stenosed.   Dist RCA lesion is 50% stenosed.   1st Diag lesion is 90% stenosed.   Prox LAD to Mid LAD lesion is 95% stenosed.   A drug-eluting stent was successfully placed using a SYNERGY XD 3.0X16.   Balloon angioplasty was performed using a BALLN SAPPHIRE 2.0X12.   Post intervention, there is a 0% residual stenosis.   Post intervention, there is a 30% residual stenosis. Severe proximal LAD stenosis  involving a moderate caliber diagonal branch. Successful PTCA/DES x 1 proximal LAD Successful balloon angioplasty Diagonal 1 No obstructive disease in the Circumflex artery Moderate non-obstructive disease in the dominant RCA Elevated right and left heart pressures. Recommendations: Continue DAPT with ASA and Plavix for one month and can then stop ASA since she will also be on Eliquis. She remains volume overload. Will need continued diuresis with caution given renal insufficiency.   ?Medications: ?Infusions: ? sodium chloride 10 mL/hr at 12/23/21 1053  ? sodium chloride    ? ampicillin-sulbactam (UNASYN) IV 3 g (12/28/21 2035)  ? ferric gluconate (FERRLECIT) IVPB 125 mg (12/28/21 1010)  ? nitroGLYCERIN Stopped (12/28/21 1437)  ? ? ?Scheduled Medications: ? amLODipine  5 mg Oral Daily  ? aspirin EC  81 mg Oral Daily  ? atorvastatin  80 mg Oral Daily  ? azithromycin  500 mg Oral Daily  ? clopidogrel  75 mg Oral Q breakfast  ? fluticasone furoate-vilanterol  1 puff Inhalation Daily  ? furosemide  40 mg Intravenous BID  ? hydrALAZINE  25 mg Oral TID  ? insulin aspart  0-5 Units Subcutaneous QHS  ? insulin aspart  0-9 Units Subcutaneous TID WC  ? latanoprost  1 drop Both Eyes QHS  ? montelukast  10 mg Oral QHS  ? ondansetron (ZOFRAN) IV  4 mg Intravenous Q6H  ? pantoprazole (PROTONIX) IV  40 mg Intravenous Q12H  ? sodium chloride flush  3 mL Intravenous Q12H  ? sodium chloride flush  3 mL Intravenous Q12H  ? sodium chloride flush  3 mL Intravenous Q12H  ? ? have reviewed scheduled and prn medications. ? ?Physical Exam: ?General:  looks more comfortable today  ?Heart: RRR ?Lungs: mostly clear but wheezes ?Abdomen: soft,  non tender ?Extremities: min peripheral edema  ? ? ? ?12/29/2021,7:57 AM ? LOS: 6 days  ? ?  ? ? ? ? ?

## 2021-12-29 NOTE — Progress Notes (Signed)
CARDIAC REHAB PHASE I  ? ?PRE:  Rate/Rhythm: 60 aflutter ? ?  BP: sitting 133/73 ? ?  SaO2: 99 3L, 97 1L, 97 RA ? ?MODE:  Ambulation: to door  ? ?POST:  Rate/Rhythm: 11 aflutter ? ?  BP: sitting 134/62  ? ?  SaO2: 99 RA ? ?Pt weak and restricted on right arm due to brachial cath and pressure dressing. Mod assist to stand. Anterior lean over RW. Fatigue with short distance, needed to turn around at door of room. To recliner, fatigued and weak. VSS, SaO2 99 RA after walk therefore left off O2. Pt will need PT/OT for strengthening and d/c planning. Daughter/caregiver in room, supportive. She works from home. Gave her MI book.  ?[phone] ? ?Yves Dill CES, ACSM ?12/29/2021 ?8:41 AM ? ? ? ? ?

## 2021-12-29 NOTE — Progress Notes (Addendum)
? ?Progress Note ? ?Patient Name: [name] ?Date of Encounter: 12/29/2021 ? ?[facility] Cardiologist: Dorris Carnes, MD  ? ?Subjective  ? ?Patient denies any episodes of chest pain overnight/this morning. Breathing has improved and she is tolerating room air. Patient is able to tolerate laying flat. Denies any palpitations. Denies any pain or bleeding at radial cath site. Had some pain around antecubital cath site but is not improved.  ? ?Inpatient Medications  ?  ?Scheduled Meds: ? aspirin EC  81 mg Oral Daily  ? atorvastatin  80 mg Oral Daily  ? azithromycin  500 mg Oral Daily  ? clopidogrel  75 mg Oral Q breakfast  ? fluticasone furoate-vilanterol  1 puff Inhalation Daily  ? furosemide  40 mg Intravenous Q12H  ? hydrALAZINE  25 mg Oral TID  ? insulin aspart  0-5 Units Subcutaneous QHS  ? insulin aspart  0-9 Units Subcutaneous TID WC  ? latanoprost  1 drop Both Eyes QHS  ? montelukast  10 mg Oral QHS  ? ondansetron (ZOFRAN) IV  4 mg Intravenous Q6H  ? pantoprazole (PROTONIX) IV  40 mg Intravenous Q12H  ? sodium chloride flush  3 mL Intravenous Q12H  ? sodium chloride flush  3 mL Intravenous Q12H  ? sodium chloride flush  3 mL Intravenous Q12H  ? ?Continuous Infusions: ? sodium chloride 10 mL/hr at 12/23/21 1053  ? sodium chloride    ? ampicillin-sulbactam (UNASYN) IV 3 g (12/28/21 2035)  ? ferric gluconate (FERRLECIT) IVPB 125 mg (12/28/21 1010)  ? nitroGLYCERIN Stopped (12/28/21 1437)  ? ?PRN Meds: ?sodium chloride, acetaminophen, albuterol, albuterol, bisacodyl, metoCLOPramide (REGLAN) injection, nitroGLYCERIN, sodium chloride flush, white petrolatum  ? ?Vital Signs  ?  ?Vitals:  ? 12/28/21 1636 12/28/21 2028 12/28/21 2352 12/29/21 0425  ?BP: (!) 143/70 128/66 129/74 133/67  ?Pulse:  60 66 60  ?Resp:  20 19 20   ?Temp:  98.6 ?F (37 ?C) 97.9 ?F (36.6 ?C) 98.1 ?F (36.7 ?C)  ?TempSrc:  Oral Oral Oral  ?SpO2:  100% 99% 99%  ?Weight:      ?Height:      ? ? ?Intake/Output Summary (Last 24 hours) at 12/29/2021  0823 ?Last data filed at 12/29/2021 0400 ?Gross per 24 hour  ?Intake 1617.85 ml  ?Output 100 ml  ?Net 1517.85 ml  ? ? ?  12/26/2021  ?  6:40 AM 12/23/2021  ? 10:25 AM 12/23/2021  ?  4:09 AM  ?Last 3 Weights  ?Weight (lbs) 178 lb 5.6 oz 181 lb 181 lb  ?Weight (kg) 80.9 kg 82.101 kg 82.101 kg  ?   ? ?Telemetry  ?  ?Atrial flutter, HR in the low 60s - Personally Reviewed ? ?ECG  ?  ?Atrial flutter, HR 60 - Personally Reviewed ? ?Physical Exam  ? ?GEN: No acute distress. Sitting comfortably in the chair with her legs elevated ?Neck: No JVD ?Cardiac: RRR, no murmurs, rubs, or gallops. Right radial cath site without bleeding, tenderness, bleeding, or bruising. Right antecubital cath site is soft and without bleeding, bruising, mildly tender to palpation. Right radial pulse 2+.  ?Respiratory: Clear to auscultation bilaterally, occasional expiratory wheezes  ?GI: Soft, nontender, non-distended  ?MS: 1+ pitting edema in BLE, more noticeable in left. No redness, warmth, or tenderness to BLE. No deformity. ?Neuro:  Nonfocal  ?Psych: Normal affect  ? ?Labs  ?  ?High Sensitivity Troponin:   ?Recent Labs  ?Lab 12/23/21 ?0426 12/23/21 ?0617 12/27/21 ?7253 12/27/21 ?1118  ?TROPONINIHS 187* 346* 2,797*  2,716*  ?   ?Chemistry ?Recent Labs  ?Lab 12/27/21 ?3244 12/28/21 ?0109 12/28/21 ?1357 12/28/21 ?1406 12/29/21 ?0102  ?NA 137 137 144 139 136  ?K 4.5 4.0 3.3* 4.0 4.3  ?CL 108 106  --   --  106  ?CO2 18* 18*  --   --  18*  ?GLUCOSE 180* 148*  --   --  110*  ?BUN 59* 62*  --   --  61*  ?CREATININE 3.21* 3.49*  --   --  3.90*  ?CALCIUM 9.0 8.9  --   --  8.8*  ?MG 2.2 2.1  --   --  2.2  ?PROT 6.5 6.1*  --   --  6.1*  ?ALBUMIN 3.2* 3.0*  --   --  2.9*  ?AST 36 44*  --   --  129*  ?ALT 28 38  --   --  89*  ?ALKPHOS 85 84  --   --  101  ?BILITOT 1.7* 1.7*  --   --  1.7*  ?GFRNONAA 14* 12*  --   --  11*  ?ANIONGAP 11 13  --   --  12  ?  ?Lipids  ?Recent Labs  ?Lab 12/23/21 ?1030  ?CHOL 120  ?TRIG 46  ?HDL 55  ?[place] 56  ?CHOLHDL 2.2  ?   ?Hematology ?Recent Labs  ?Lab 12/27/21 ?7253 12/28/21 ?0109 12/28/21 ?1357 12/28/21 ?1406 12/29/21 ?6644  ?WBC 11.5* 10.0  --   --  12.0*  ?RBC 3.54* 3.36*  --   --  3.25*  ?HGB 9.7* 9.3* 8.2* 9.5* 8.9*  ?HCT 28.2* 26.9* 24.0* 28.0* 26.0*  ?MCV 79.7* 80.1  --   --  80.0  ?MCH 27.4 27.7  --   --  27.4  ?MCHC 34.4 34.6  --   --  34.2  ?RDW 15.3 15.9*  --   --  15.7*  ?PLT 147* 151  --   --  145*  ? ?Thyroid  ?Recent Labs  ?Lab 12/23/21 ?1030  ?TSH 3.434  ?FREET4 1.15*  ?  ?BNP ?Recent Labs  ?Lab 12/27/21 ?0347 12/28/21 ?0109 12/29/21 ?4259  ?BNP 1,711.2* 1,969.3* 2,207.8*  ?  ?DDimer No results for input(s): DDIMER in the last 168 hours.  ? ?Radiology  ?  ?CARDIAC CATHETERIZATION ? ?Result Date: 12/28/2021 ?  Mid RCA lesion is 30% stenosed.   Dist RCA lesion is 50% stenosed.   1st Diag lesion is 90% stenosed.   Prox LAD to Mid LAD lesion is 95% stenosed.   A drug-eluting stent was successfully placed using a SYNERGY XD 3.0X16.   Balloon angioplasty was performed using a BALLN SAPPHIRE 2.0X12.   Post intervention, there is a 0% residual stenosis.   Post intervention, there is a 30% residual stenosis. Severe proximal LAD stenosis involving a moderate caliber diagonal branch. Successful PTCA/DES x 1 proximal LAD Successful balloon angioplasty Diagonal 1 No obstructive disease in the Circumflex artery Moderate non-obstructive disease in the dominant RCA Elevated right and left heart pressures. Recommendations: Continue DAPT with ASA and Plavix for one month and can then stop ASA since she will also be on Eliquis. She remains volume overload. Will need continued diuresis with caution given renal insufficiency.   ? ?Cardiac Studies  ? ?Right/Left Heart Cath 12/28/21 ?  Mid RCA lesion is 30% stenosed. ?  Dist RCA lesion is 50% stenosed. ?  1st Diag lesion is 90% stenosed. ?  Prox LAD to Mid LAD lesion is 95% stenosed. ?  A  drug-eluting stent was successfully placed using a SYNERGY XD 3.0X16. ?  Balloon angioplasty was  performed using a BALLN SAPPHIRE 2.0X12. ?  Post intervention, there is a 0% residual stenosis. ?  Post intervention, there is a 30% residual stenosis. ?  ?Severe proximal LAD stenosis involving a moderate caliber diagonal branch.  ?Successful PTCA/DES x 1 proximal LAD ?Successful balloon angioplasty Diagonal 1 ?No obstructive disease in the Circumflex artery ?Moderate non-obstructive disease in the dominant RCA ?Elevated right and left heart pressures.  ?  ?Recommendations: Continue DAPT with ASA and Plavix for one month and can then stop ASA since she will also be on Eliquis. She remains volume overload. Will need continued diuresis with caution given renal insufficiency.  ? ?Diagnostic ?Dominance: Right ?Intervention ? ? ? ? ? ?Patient Profile  ?   ?86 y.o. female with a history of diabetes mellitus, hyperlipidemia, complete heart block s/p pacemaker, paroxysmal atrial fibrillation/flutter and mod CAD (CT in 2017 with calcium scoreof  51  mod dz LAD; filling defect in LAA), and chronic kidney disease stage IV admitted for chest pain.  Course complicated by persistent nausea with dry heaves>> being followed by GI. Possible  ?  ?Also with worsening renal function>> being followed by nephrology.  Echo with new LV dysfunction ? ?Assessment & Plan  ?  ?NSTEMI ?-Patient was not feeling well last week and then started to having chest pain. Initial Hs-troponin 187>>346.  Echo showed newly reduced LV function with wall motion abnormality.  Taken to Cath Lab 3/24 but case aborted due to acute dyspnea with hypoxia.  Now patient has worsening renal function.  Later developed worsening chest pain on 3/28 with Hs-troponin 2797>>2716.  Felt like missed troponin peaked in between. ?- Patient underwent L/R heart cath 3/29 with successful PTCA/DES to the proximal LAD, successful balloon angioplasty to diagonal 1. Patient had elevated Right and Left Heart pressures  ?- Per cath report, recommended to continue DAPT with ASA and  Plavix for 1 month then to stop ASA as patient will be on Eliquis ?- Continue Lipitor 80 mg daily ?- Attempt to start BB as BP tolerates. BP has been somewhat low during diuresis so will hold off for now.  ?  ?Acute systoli

## 2021-12-30 DIAGNOSIS — I214 Non-ST elevation (NSTEMI) myocardial infarction: Secondary | ICD-10-CM | POA: Diagnosis not present

## 2021-12-30 LAB — GLUCOSE, CAPILLARY
Glucose-Capillary: 165 mg/dL — ABNORMAL HIGH (ref 70–99)
Glucose-Capillary: 178 mg/dL — ABNORMAL HIGH (ref 70–99)
Glucose-Capillary: 151 mg/dL — ABNORMAL HIGH (ref 70–99)
Glucose-Capillary: 173 mg/dL — ABNORMAL HIGH (ref 70–99)

## 2021-12-30 LAB — COMPREHENSIVE METABOLIC PANEL
ALT: 105 U/L — ABNORMAL HIGH (ref 0–44)
AST: 104 U/L — ABNORMAL HIGH (ref 15–41)
Albumin: 2.9 g/dL — ABNORMAL LOW (ref 3.5–5.0)
Alkaline Phosphatase: 148 U/L — ABNORMAL HIGH (ref 38–126)
Anion gap: 12 (ref 5–15)
BUN: 67 mg/dL — ABNORMAL HIGH (ref 8–23)
CO2: 19 mmol/L — ABNORMAL LOW (ref 22–32)
Calcium: 8.2 mg/dL — ABNORMAL LOW (ref 8.9–10.3)
Chloride: 107 mmol/L (ref 98–111)
Creatinine, Ser: 4.4 mg/dL — ABNORMAL HIGH (ref 0.44–1.00)
GFR, Estimated: 9 mL/min — ABNORMAL LOW (ref >60)
Glucose, Bld: 191 mg/dL — ABNORMAL HIGH (ref 70–99)
Potassium: 3.8 mmol/L (ref 3.5–5.1)
Sodium: 138 mmol/L (ref 135–145)
Total Bilirubin: 1.4 mg/dL — ABNORMAL HIGH (ref 0.3–1.2)
Total Protein: 6.5 g/dL (ref 6.5–8.1)

## 2021-12-30 MED ORDER — FUROSEMIDE 10 MG/ML IJ SOLN
80.0000 mg | Freq: Two times a day (BID) | INTRAMUSCULAR | Status: DC
  Administered 2021-12-30 – 2021-12-31 (×3): 80 mg via INTRAVENOUS
  Filled 2021-12-30 (×4): qty 8

## 2021-12-30 NOTE — Progress Notes (Signed)
PT Cancellation Note ? ?Patient Details ?Name: [name] ?MRN: [id number] ?DOB: 06-29-1936 ? ? ?Cancelled Treatment:    Reason Eval/Treat Not Completed: Other (comment) (RN reports hold as MD states pt not to participate in therapy today as she had a hard night last night.) Will continue efforts per PT plan of care next date as schedule permits. ?Kara Pacer Kedarius Aloisi ?12/30/2021, 11:56 AM ? ? ?

## 2021-12-30 NOTE — Progress Notes (Signed)
Pt refused ambulation today. Discussed with pt and daughter MI, stent, Plavix, low sodium, daily wts, and CRPII. Pt receptive. Will refer to [place] but encouraged pt that she needs to build up her strength but mobilizing. ?[id number] ?Yves Dill CES, ACSM ?2:38 PM ?12/30/2021 ? ?

## 2021-12-30 NOTE — Progress Notes (Addendum)
? ?Progress Note ? ?Patient Name: [name] ?Date of Encounter: 12/30/2021 ? ?[facility] Cardiologist: Dorris Carnes, MD  ? ?Subjective  ? ?Patient had a rough night   Around 72 daughter says she got acutely agitated    Sat up       It was transient   Went back to bed  Didn't sleep well ?Deneis CP   No SOB now    Had some abdominal discomfort (LLQ)     Had had 3 small loose BMs ? ?Inpatient Medications  ?  ?Scheduled Meds: ? apixaban  2.5 mg Oral BID  ? aspirin EC  81 mg Oral Daily  ? atorvastatin  80 mg Oral Daily  ? azithromycin  500 mg Oral Daily  ? clopidogrel  75 mg Oral Q breakfast  ? fluticasone furoate-vilanterol  1 puff Inhalation Daily  ? furosemide  80 mg Intravenous Q12H  ? hydrALAZINE  25 mg Oral TID  ? insulin aspart  0-5 Units Subcutaneous QHS  ? insulin aspart  0-9 Units Subcutaneous TID WC  ? latanoprost  1 drop Both Eyes QHS  ? montelukast  10 mg Oral QHS  ? ondansetron (ZOFRAN) IV  4 mg Intravenous Q6H  ? pantoprazole  40 mg Oral BID  ? sodium chloride flush  3 mL Intravenous Q12H  ? sodium chloride flush  3 mL Intravenous Q12H  ? sodium chloride flush  3 mL Intravenous Q12H  ? ?Continuous Infusions: ? sodium chloride 10 mL/hr at 12/23/21 1053  ? sodium chloride    ? ferric gluconate (FERRLECIT) IVPB 125 mg (12/29/21 7096)  ? nitroGLYCERIN Stopped (12/28/21 1437)  ? ?PRN Meds: ?sodium chloride, acetaminophen, albuterol, albuterol, bisacodyl, metoCLOPramide (REGLAN) injection, nitroGLYCERIN, sodium chloride flush, white petrolatum  ? ?Vital Signs  ?  ?Vitals:  ? 12/29/21 1604 12/29/21 2044 12/29/21 2319 12/30/21 0500  ?BP: 121/64 129/74 131/70   ?Pulse: 60 76 60   ?Resp: 20 20 20    ?Temp: 98 ?F (36.7 ?C) 98 ?F (36.7 ?C) 98 ?F (36.7 ?C)   ?TempSrc: Oral Oral Oral   ?SpO2: 99% 99% 97%   ?Weight:    80.7 kg  ?Height:      ? ? ?Intake/Output Summary (Last 24 hours) at 12/30/2021 0853 ?Last data filed at 12/30/2021 0620 ?Gross per 24 hour  ?Intake 720 ml  ?Output 350 ml  ?Net 370 ml  ? ? ?  12/30/2021   ?  5:00 AM 12/26/2021  ?  6:40 AM 12/23/2021  ? 10:25 AM  ?Last 3 Weights  ?Weight (lbs) 177 lb 14.6 oz 178 lb 5.6 oz 181 lb  ?Weight (kg) 80.7 kg 80.9 kg 82.101 kg  ?   ? ?Telemetry  ?  ?Atrial fib   Paced - Personally Reviewed ? ?ECG  ?  ?No new - Personally Reviewed ? ?Physical Exam  ? ?GEN: No acute distress.  ?Neck: No JVD ?Cardiac: RRR, no murmurs,  ?Respiratory: Clear to auscultation bilaterally, o ?GI: Soft, nontender, non-distended  ?Ext  Tr  LE edema ?Neuro:  Nonfocal  ?Psych: Normal affect  ? ?Labs  ?  ?High Sensitivity Troponin:   ?Recent Labs  ?Lab 12/23/21 ?0426 12/23/21 ?0617 12/27/21 ?2836 12/27/21 ?1118  ?TROPONINIHS 187* 346* 2,797* 2,716*  ?   ?Chemistry ?Recent Labs  ?Lab 12/27/21 ?6294 12/28/21 ?0109 12/28/21 ?1357 12/28/21 ?1406 12/29/21 ?7654 12/30/21 ?6503  ?NA 137 137   < > 139 136 138  ?K 4.5 4.0   < > 4.0 4.3 3.8  ?  CL 108 106  --   --  106 107  ?CO2 18* 18*  --   --  18* 19*  ?GLUCOSE 180* 148*  --   --  110* 191*  ?BUN 59* 62*  --   --  61* 67*  ?CREATININE 3.21* 3.49*  --   --  3.90* 4.40*  ?CALCIUM 9.0 8.9  --   --  8.8* 8.2*  ?MG 2.2 2.1  --   --  2.2  --   ?PROT 6.5 6.1*  --   --  6.1* 6.5  ?ALBUMIN 3.2* 3.0*  --   --  2.9* 2.9*  ?AST 36 44*  --   --  129* 104*  ?ALT 28 38  --   --  89* 105*  ?ALKPHOS 85 84  --   --  101 148*  ?BILITOT 1.7* 1.7*  --   --  1.7* 1.4*  ?GFRNONAA 14* 12*  --   --  11* 9*  ?ANIONGAP 11 13  --   --  12 12  ? < > = values in this interval not displayed.  ?  ?Lipids  ?Recent Labs  ?Lab 12/23/21 ?1030  ?CHOL 120  ?TRIG 46  ?HDL 55  ?[place] 56  ?CHOLHDL 2.2  ?  ?Hematology ?Recent Labs  ?Lab 12/27/21 ?7902 12/28/21 ?0109 12/28/21 ?1357 12/28/21 ?1400 12/28/21 ?1406 12/29/21 ?4097  ?WBC 11.5* 10.0  --   --   --  12.0*  ?RBC 3.54* 3.36*  --   --   --  3.25*  ?HGB 9.7* 9.3*   < > 6.8* 9.5* 8.9*  ?HCT 28.2* 26.9*   < > 20.0* 28.0* 26.0*  ?MCV 79.7* 80.1  --   --   --  80.0  ?MCH 27.4 27.7  --   --   --  27.4  ?MCHC 34.4 34.6  --   --   --  34.2  ?RDW 15.3 15.9*  --    --   --  15.7*  ?PLT 147* 151  --   --   --  145*  ? < > = values in this interval not displayed.  ? ?Thyroid  ?Recent Labs  ?Lab 12/23/21 ?1030  ?TSH 3.434  ?FREET4 1.15*  ?  ?BNP ?Recent Labs  ?Lab 12/27/21 ?3532 12/28/21 ?0109 12/29/21 ?9924  ?BNP 1,711.2* 1,969.3* 2,207.8*  ?  ?DDimer No results for input(s): DDIMER in the last 168 hours.  ? ?Radiology  ?  ?CARDIAC CATHETERIZATION ? ?Result Date: 12/28/2021 ?  Mid RCA lesion is 30% stenosed.   Dist RCA lesion is 50% stenosed.   1st Diag lesion is 90% stenosed.   Prox LAD to Mid LAD lesion is 95% stenosed.   A drug-eluting stent was successfully placed using a SYNERGY XD 3.0X16.   Balloon angioplasty was performed using a BALLN SAPPHIRE 2.0X12.   Post intervention, there is a 0% residual stenosis.   Post intervention, there is a 30% residual stenosis. Severe proximal LAD stenosis involving a moderate caliber diagonal branch. Successful PTCA/DES x 1 proximal LAD Successful balloon angioplasty Diagonal 1 No obstructive disease in the Circumflex artery Moderate non-obstructive disease in the dominant RCA Elevated right and left heart pressures. Recommendations: Continue DAPT with ASA and Plavix for one month and can then stop ASA since she will also be on Eliquis. She remains volume overload. Will need continued diuresis with caution given renal insufficiency.   ? ?Cardiac Studies  ? ?Right/Left Heart Cath 12/28/21 ?  Mid  RCA lesion is 30% stenosed. ?  Dist RCA lesion is 50% stenosed. ?  1st Diag lesion is 90% stenosed. ?  Prox LAD to Mid LAD lesion is 95% stenosed. ?  A drug-eluting stent was successfully placed using a SYNERGY XD 3.0X16. ?  Balloon angioplasty was performed using a BALLN SAPPHIRE 2.0X12. ?  Post intervention, there is a 0% residual stenosis. ?  Post intervention, there is a 30% residual stenosis. ?  ?Severe proximal LAD stenosis involving a moderate caliber diagonal branch.  ?Successful PTCA/DES x 1 proximal LAD ?Successful balloon angioplasty  Diagonal 1 ?No obstructive disease in the Circumflex artery ?Moderate non-obstructive disease in the dominant RCA ?Elevated right and left heart pressures.  ?  ?Recommendations: Continue DAPT with ASA and Plavix for one month and can then stop ASA since she will also be on Eliquis. She remains volume overload. Will need continued diuresis with caution given renal insufficiency.  ? ?Diagnostic ?Dominance: Right ?Intervention ? ? ? ? ? ?Patient Profile  ?   ?86 y.o. female with a history of diabetes mellitus, hyperlipidemia, complete heart block s/p pacemaker, paroxysmal atrial fibrillation/flutter and mod CAD (CT in 2017 with calcium scoreof  51  mod dz LAD; filling defect in LAA), and chronic kidney disease stage IV admitted for chest pain.  Course complicated by persistent nausea with dry heaves>> being followed by GI. Possible  ?  ?Also with worsening renal function>> being followed by nephrology.  Echo with new LV dysfunction ? ?Assessment & Plan  ?  ?NSTEMI ?-Patient was not feeling well last week and then started to having chest pain. Initial Hs-troponin 187>>346.  Echo showed newly reduced LV function with wall motion abnormality.  Taken to Cath Lab 3/24 but case aborted due to acute dyspnea with hypoxia.  Now patient has worsening renal function.  Later developed worsening chest pain on 3/28 with Hs-troponin 2797>>2716.  Felt like missed troponin peaked in between. ?- Patient underwent L/R heart cath 3/29 with successful PTCA/DES to the proximal LAD, successful balloon angioplasty to diagonal 1. Patient had elevated Right and Left Heart pressures  ?- Per cath report, recommended to continue DAPT with ASA and Plavix for 1 month then to stop ASA as patient will be on Eliquis ?- Continue Lipitor 80 mg daily ? ?NOT sure what happened last night   ? Due to a little volume overload  Follow   Patient currently onO2 Helper   Appears more comfortable but tired   ? ?Acute systolic heart failure ?-Echocardiogram showed  newly reduced LV function at 30 to 35% with regional wall motion abnormality.  ? Aborted cardiac cath on 3/24 due to acute dyspnea with hypoxia.  ?Final cath on 3/29 ?-Patient will need guideline directed me

## 2021-12-30 NOTE — Progress Notes (Signed)
OT Cancellation Note ? ?Patient Details ?Name: [name] ?MRN: [id number] ?DOB: 1935/10/12 ? ? ?Cancelled Treatment:    Reason Eval/Treat Not Completed: Other (comment) (RN reports hold as MD states pt not to participate in therapy today as she had a hard night last night. Will return as schedule allows.) ? ?Marrah Vanevery M Irvan Tiedt ?Latajah Thuman MSOT, OTR/L ?Acute Rehab ?Pager: [phone] ?Office: [phone] ?12/30/2021, 11:50 AM ?

## 2021-12-30 NOTE — Plan of Care (Signed)
?  Problem: Education: ?Goal: Knowledge of General Education information will improve ?Description: Including pain rating scale, medication(s)/side effects and non-pharmacologic comfort measures ?Outcome: Progressing ?  ?Problem: Health Behavior/Discharge Planning: ?Goal: Ability to manage health-related needs will improve ?Outcome: Progressing ?  ?Problem: Clinical Measurements: ?Goal: Ability to maintain clinical measurements within normal limits will improve ?Outcome: Progressing ?Goal: Will remain free from infection ?Outcome: Progressing ?Goal: Diagnostic test results will improve ?Outcome: Progressing ?Goal: Cardiovascular complication will be avoided ?Outcome: Progressing ?  ?Problem: Nutrition: ?Goal: Adequate nutrition will be maintained ?Outcome: Progressing ?  ?Problem: Coping: ?Goal: Level of anxiety will decrease ?Outcome: Progressing ?  ?Problem: Elimination: ?Goal: Will not experience complications related to bowel motility ?Outcome: Progressing ?Goal: Will not experience complications related to urinary retention ?Outcome: Progressing ?  ?Problem: Pain Managment: ?Goal: General experience of comfort will improve ?Outcome: Progressing ?  ?Problem: Safety: ?Goal: Ability to remain free from injury will improve ?Outcome: Progressing ?  ?Problem: Skin Integrity: ?Goal: Risk for impaired skin integrity will decrease ?Outcome: Progressing ?  ?Problem: Clinical Measurements: ?Goal: Respiratory complications will improve ?Outcome: Not Progressing ?  ?Problem: Activity: ?Goal: Risk for activity intolerance will decrease ?Outcome: Not Progressing ?  ?

## 2021-12-30 NOTE — Progress Notes (Signed)
Subjective:  " had a bad night"  got SOB and tired with PT- coughing up phlegm-  only 350 of UOP recorded but family says more-  BUN and crt worse today but not to dangerous level- BP and O2 sats fine  ? ?Objective ?Vital signs in last 24 hours: ?Vitals:  ? 12/29/21 1604 12/29/21 2044 12/29/21 2319 12/30/21 0500  ?BP: 121/64 129/74 131/70   ?Pulse: 60 76 60   ?Resp: 20 20 20    ?Temp: 98 ?F (36.7 ?C) 98 ?F (36.7 ?C) 98 ?F (36.7 ?C)   ?TempSrc: Oral Oral Oral   ?SpO2: 99% 99% 97%   ?Weight:    80.7 kg  ?Height:      ? ?Weight change:  ? ?Intake/Output Summary (Last 24 hours) at 12/30/2021 0828 ?Last data filed at 12/30/2021 0620 ?Gross per 24 hour  ?Intake 720 ml  ?Output 350 ml  ?Net 370 ml  ? ? ?Assessment/Plan: 86 year old BF with multiple medical issues to include CKD-  crt in mid to high 1's-  admit with CP and decreased EF but many other hospital complications as well including A on CRF ?1.Renal- baseline stage 4 CKD but had been fairly stable-  now with A on CRF in the setting of decreased EF and soft BP-  u/s negative- urine pretty bland-  no protein.  feel that A on crf is hemodynamically mediated with low BP this admit.  Dr. Caryl Comes had decreased both her amlodipine and hydralazine appropriately-  crt was trying to plateau it seemed after worsening but then worsened again-   then with dye load 3/29.  BUN and crt worsening since dye but no absolute indications for RRT and I really hope that we will be able to avoid it in her.  If numbers worse and she is still feeling really bad tomorrow then we may need to do  ?2. Hypertension/volume  - She was getting fluids 3/27 but since have been giving her lasix-  lungs today when she is on her left side reveal crackles on left -  also wheezing-  has responded to breathing tx in the past-  no inc O2 req-  will increase lasix to 80 q 12  ?3. Anemia  - hgb just under 10-   iron stores low, repleting ?4. ID-  thought to have PNA- on Unasyn and azithro -  needs pulmonary  toilet ?5. Nausea-  thought to be just residual from her other illness -  reglan and antiemetics -  better-  BUN not high enough to cause ?  ? ? ?Louis Meckel  ? ? ?Labs: ?Basic Metabolic Panel: ?Recent Labs  ?Lab 12/28/21 ?0109 12/28/21 ?1357 12/28/21 ?1406 12/29/21 ?4982 12/30/21 ?6415  ?NA 137   < > 139 136 138  ?K 4.0   < > 4.0 4.3 3.8  ?CL 106  --   --  106 107  ?CO2 18*  --   --  18* 19*  ?GLUCOSE 148*  --   --  110* 191*  ?BUN 62*  --   --  61* 67*  ?CREATININE 3.49*  --   --  3.90* 4.40*  ?CALCIUM 8.9  --   --  8.8* 8.2*  ? < > = values in this interval not displayed.  ? ?Liver Function Tests: ?Recent Labs  ?Lab 12/28/21 ?0109 12/29/21 ?8309 12/30/21 ?4076  ?AST 44* 129* 104*  ?ALT 38 89* 105*  ?ALKPHOS 84 101 148*  ?BILITOT 1.7* 1.7* 1.4*  ?PROT  6.1* 6.1* 6.5  ?ALBUMIN 3.0* 2.9* 2.9*  ? ?Recent Labs  ?Lab 12/25/21 ?0017  ?LIPASE 31  ? ?No results for input(s): AMMONIA in the last 168 hours. ?CBC: ?Recent Labs  ?Lab 12/25/21 ?0017 12/26/21 ?0139 12/27/21 ?0932 12/28/21 ?0109 12/28/21 ?1357 12/28/21 ?1400 12/28/21 ?1406 12/29/21 ?3557  ?WBC 12.6* 12.0* 11.5* 10.0  --   --   --  12.0*  ?HGB 10.8* 9.7* 9.7* 9.3*   < > 6.8* 9.5* 8.9*  ?HCT 32.1* 27.9* 28.2* 26.9*   < > 20.0* 28.0* 26.0*  ?MCV 80.5 79.5* 79.7* 80.1  --   --   --  80.0  ?PLT 136* 124* 147* 151  --   --   --  145*  ? < > = values in this interval not displayed.  ? ?Cardiac Enzymes: ?No results for input(s): CKTOTAL, CKMB, CKMBINDEX, TROPONINI in the last 168 hours. ?CBG: ?Recent Labs  ?Lab 12/29/21 ?3220 12/29/21 ?1109 12/29/21 ?1608 12/29/21 ?2123 12/30/21 ?2542  ?GLUCAP 108* 125* 176* 191* 165*  ? ? ?Iron Studies:  ?Recent Labs  ?  12/27/21 ?0830  ?IRON 44  ?TIBC 263  ?FERRITIN 81  ? ?Studies/Results: ?CARDIAC CATHETERIZATION ? ?Result Date: 12/28/2021 ?  Mid RCA lesion is 30% stenosed.   Dist RCA lesion is 50% stenosed.   1st Diag lesion is 90% stenosed.   Prox LAD to Mid LAD lesion is 95% stenosed.   A drug-eluting stent was  successfully placed using a SYNERGY XD 3.0X16.   Balloon angioplasty was performed using a BALLN SAPPHIRE 2.0X12.   Post intervention, there is a 0% residual stenosis.   Post intervention, there is a 30% residual stenosis. Severe proximal LAD stenosis involving a moderate caliber diagonal branch. Successful PTCA/DES x 1 proximal LAD Successful balloon angioplasty Diagonal 1 No obstructive disease in the Circumflex artery Moderate non-obstructive disease in the dominant RCA Elevated right and left heart pressures. Recommendations: Continue DAPT with ASA and Plavix for one month and can then stop ASA since she will also be on Eliquis. She remains volume overload. Will need continued diuresis with caution given renal insufficiency.   ?Medications: ?Infusions: ? sodium chloride 10 mL/hr at 12/23/21 1053  ? sodium chloride    ? ampicillin-sulbactam (UNASYN) IV 3 g (12/30/21 0803)  ? ferric gluconate (FERRLECIT) IVPB 125 mg (12/29/21 7062)  ? nitroGLYCERIN Stopped (12/28/21 1437)  ? ? ?Scheduled Medications: ? apixaban  2.5 mg Oral BID  ? aspirin EC  81 mg Oral Daily  ? atorvastatin  80 mg Oral Daily  ? azithromycin  500 mg Oral Daily  ? clopidogrel  75 mg Oral Q breakfast  ? fluticasone furoate-vilanterol  1 puff Inhalation Daily  ? furosemide  40 mg Intravenous Q12H  ? hydrALAZINE  25 mg Oral TID  ? insulin aspart  0-5 Units Subcutaneous QHS  ? insulin aspart  0-9 Units Subcutaneous TID WC  ? latanoprost  1 drop Both Eyes QHS  ? montelukast  10 mg Oral QHS  ? ondansetron (ZOFRAN) IV  4 mg Intravenous Q6H  ? pantoprazole  40 mg Oral BID  ? sodium chloride flush  3 mL Intravenous Q12H  ? sodium chloride flush  3 mL Intravenous Q12H  ? sodium chloride flush  3 mL Intravenous Q12H  ? ? have reviewed scheduled and prn medications. ? ?Physical Exam: ?General:  looks more fatigued today than yesterday  ?Heart: RRR ?Lungs:  wheezes-  left lung when she is on her left side is congested  ?  Abdomen: soft,  non tender ?Extremities:  min peripheral edema  ? ? ? ?12/30/2021,8:28 AM ? LOS: 7 days  ? ?  ? ? ? ? ?

## 2021-12-30 NOTE — TOC Initial Note (Signed)
Transition of Care (TOC) - Initial/Assessment Note  ? ? ?Patient Details  ?Name: [name] ?MRN: [id number] ?Date of Birth: 1936-06-09 ? ?Transition of Care (TOC) CM/SW Contact:    ?Angelita Ingles, RN ?Phone Number:[phone] ? ?12/30/2021, 3:43 PM ? ?Clinical Narrative:                 ?TOC following patient with high risk for readmission. Cm at bedside with patient and daughter for assessment. Patient states that she is from home with her daughter where she functions independently. NO HH services no DME. Patient does have PCP and daughter states that she follows up on a regular basis. Daughter thinks the name is Jodi Mourning. Patient has access to medications and [facility] [address]. Daughter assures that patient makes it to her appointments and all needs are met. There are currently no TOC needs.  ? ?Expected Discharge Plan: Home/Self Care ?Barriers to Discharge: Continued Medical Work up ? ? ?Patient Goals and CMS Choice ?Patient states their goals for this hospitalization and ongoing recovery are:: Wants to get better to go home ?  ?Choice offered to / list presented to : NA ? ?Expected Discharge Plan and Services ?Expected Discharge Plan: Home/Self Care ?In-house Referral: NA ?Discharge Planning Services: CM Consult ?Post Acute Care Choice: NA ?Living arrangements for the past 2 months: [place] ?                ?DME Arranged: N/A ?DME Agency: NA ?  ?  ?  ?HH Arranged: NA ?[place] Agency: NA ?  ?  ?  ? ?Prior Living Arrangements/Services ?Living arrangements for the past 2 months: [place] ?Lives with:: Adult Children ?Patient language and need for interpreter reviewed:: Yes ?Do you feel safe going back to the place where you live?: Yes      ?Need for Family Participation in Patient Care: Yes (Comment) ?Care giver support system in place?: Yes (comment) ?Current home services:  (n/a) ?Criminal Activity/Legal Involvement Pertinent to Current Situation/Hospitalization: No - Comment as  needed ? ?Activities of Daily Living ?Home Assistive Devices/Equipment: None ?ADL Screening (condition at time of admission) ?Patient's cognitive ability adequate to safely complete daily activities?: Yes ?Is the patient deaf or have difficulty hearing?: No ?Does the patient have difficulty seeing, even when wearing glasses/contacts?: No ?Does the patient have difficulty concentrating, remembering, or making decisions?: No ?Patient able to express need for assistance with ADLs?: Yes ?Does the patient have difficulty dressing or bathing?: No ?Independently performs ADLs?: Yes (appropriate for developmental age) ?Does the patient have difficulty walking or climbing stairs?: No ?Weakness of Legs: None ?Weakness of Arms/Hands: None ? ?Permission Sought/Granted ?  ?Permission granted to share information with : Yes, Verbal Permission Granted ? Share Information with NAME: Reubin Milan ?   ? Permission granted to share info w Relationship: daughter ? Permission granted to share info w Contact Information: [phone] ? ?Emotional Assessment ?Appearance:: Appears older than stated age ?Attitude/Demeanor/Rapport: Gracious ?Affect (typically observed): Accepting, Pleasant, Quiet ?Orientation: : Oriented to Self, Oriented to Place, Oriented to  Time, Oriented to Situation ?Alcohol / Substance Use: Not Applicable ?  ? ?Admission diagnosis:  NSTEMI (non-ST elevated myocardial infarction) ([place]) [I21.4] ?Patient Active Problem List  ? Diagnosis Date Noted  ? NSTEMI (non-ST elevated myocardial infarction) ([place]) 12/23/2021  ? Osteoarthritis of knee 03/30/2021  ? Bilateral hand pain 03/08/2021  ? Complete heart block ([place]) 12/13/2020  ? Pacemaker - MDT 12/13/2020  ? SOB (shortness of  breath) 07/29/2020  ? Encntr for surgical aftcr following surgery on the circ sys 07/26/2020  ? Pain in right knee 03/17/2020  ? Chronic diastolic CHF (congestive heart failure) ([place])   ? DKA (diabetic ketoacidoses) 12/20/2019  ? Diverticulosis 12/10/2019   ? Age-related osteoporosis without current pathological fracture 10/03/2019  ? Chronic kidney disease, stage 3 unspecified ([place]) 10/03/2019  ? Long term (current) use of insulin ([place]) 10/03/2019  ? Hyperlipidemia, unspecified 10/03/2019  ? Shortness of breath 01/30/2018  ? Acute bronchiolitis 12/17/2017  ? Wheezing 10/29/2017  ? Vitamin D deficiency 07/31/2017  ? Renal insufficiency 07/31/2017  ? Knee pain 06/01/2017  ? Morbid obesity due to excess calories ([place]) 01/25/2017  ? Diabetes ([place]) 04/12/2016  ? Cough 01/20/2015  ? Atrial fibrillation ([place]) 05/28/2014  ? Personal history of colonic polyps 05/28/2014  ? Vomiting 01/29/2014  ? CAD (coronary artery disease) 09/01/2013  ? Routine general medical examination at a health care facility 07/17/2011  ? Encounter for long-term (current) use of other medications 07/06/2011  ? Nonspecific (abnormal) findings on radiological and other examination of body structure 11/09/2009  ? IRRITABLE BOWEL SYNDROME 05/07/2009  ? CHEST PAIN 05/07/2009  ? ADNEXAL MASS, RIGHT 07/22/2008  ? HEMORRHOIDS, RECURRENT 01/27/2008  ? Diverticulitis 01/27/2008  ? OTH ABNORMAL FIND RAD EXAMINATION BREAST 01/27/2008  ? GERD 01/13/2008  ? UTI 09/28/2007  ? Dyslipidemia 05/27/2007  ? ANEMIA-IRON DEFICIENCY 05/27/2007  ? ANXIETY 05/27/2007  ? Essential hypertension 05/27/2007  ? Seasonal allergic rhinitis 05/27/2007  ? Cough variant asthma 05/27/2007  ? Osteoporosis 05/27/2007  ? DIVERTICULITIS, HX OF 05/27/2007  ? ?PCP:  [name], [place] ?Pharmacy:   ?[id number] [address] ?[address] ?[id number]?Phone: [phone]: [phone]? ? ? ? ?Social Determinants of Health (SDOH) Interventions ?  ? ?Readmission Risk Interventions ? ?  12/30/2021  ?  3:40 PM  ?Readmission Risk Prevention Plan  ?Transportation Screening Complete  ?PCP or Specialist Appt within 3-5 Days Complete  ?[facility] Consult Complete  ?Social Work Consult for  RBurnsPlanning/Counseling Complete  ?Palliative Care Screening Complete  ?Medication Review (Press photographer Referral to Pharmacy  ? ? ? ?

## 2021-12-31 DIAGNOSIS — I214 Non-ST elevation (NSTEMI) myocardial infarction: Secondary | ICD-10-CM | POA: Diagnosis not present

## 2021-12-31 LAB — COMPREHENSIVE METABOLIC PANEL
ALT: 104 U/L — ABNORMAL HIGH (ref 0–44)
AST: 77 U/L — ABNORMAL HIGH (ref 15–41)
Albumin: 2.8 g/dL — ABNORMAL LOW (ref 3.5–5.0)
Alkaline Phosphatase: 146 U/L — ABNORMAL HIGH (ref 38–126)
Anion gap: 12 (ref 5–15)
BUN: 65 mg/dL — ABNORMAL HIGH (ref 8–23)
CO2: 21 mmol/L — ABNORMAL LOW (ref 22–32)
Calcium: 8.7 mg/dL — ABNORMAL LOW (ref 8.9–10.3)
Chloride: 105 mmol/L (ref 98–111)
Creatinine, Ser: 4.63 mg/dL — ABNORMAL HIGH (ref 0.44–1.00)
GFR, Estimated: 9 mL/min — ABNORMAL LOW (ref >60)
Glucose, Bld: 121 mg/dL — ABNORMAL HIGH (ref 70–99)
Potassium: 3.9 mmol/L (ref 3.5–5.1)
Sodium: 138 mmol/L (ref 135–145)
Total Bilirubin: 1.1 mg/dL (ref 0.3–1.2)
Total Protein: 6.2 g/dL — ABNORMAL LOW (ref 6.5–8.1)

## 2021-12-31 LAB — GLUCOSE, CAPILLARY
Glucose-Capillary: 115 mg/dL — ABNORMAL HIGH (ref 70–99)
Glucose-Capillary: 180 mg/dL — ABNORMAL HIGH (ref 70–99)
Glucose-Capillary: 142 mg/dL — ABNORMAL HIGH (ref 70–99)
Glucose-Capillary: 172 mg/dL — ABNORMAL HIGH (ref 70–99)

## 2021-12-31 MED ORDER — DARBEPOETIN ALFA 150 MCG/0.3ML IJ SOSY
150.0000 ug | PREFILLED_SYRINGE | INTRAMUSCULAR | Status: DC
  Administered 2021-12-31: 150 ug via SUBCUTANEOUS
  Filled 2021-12-31: qty 0.3

## 2021-12-31 NOTE — Progress Notes (Signed)
Subjective:  looks and feels better today -  not much UOP recorded but daughter thinks more on lasix 80 q 12- BP and O2 sats fine -  BUN down and crt only up slightly - ? ?Objective ?Vital signs in last 24 hours: ?Vitals:  ? 12/30/21 1611 12/30/21 1939 12/31/21 0004 12/31/21 0750  ?BP: 126/63 128/86 125/69 136/68  ?Pulse: 61 62 60 60  ?Resp: 19 20 20  (!) 22  ?Temp:  98.1 ?F (36.7 ?C) (!) 97.4 ?F (36.3 ?C) 98.3 ?F (36.8 ?C)  ?TempSrc:  Oral Oral Oral  ?SpO2: 99% 98% 99% 100%  ?Weight:   82.5 kg   ?Height:      ? ?Weight change: 1.8 kg ? ?Intake/Output Summary (Last 24 hours) at 12/31/2021 0902 ?Last data filed at 12/31/2021 0809 ?Gross per 24 hour  ?Intake --  ?Output 500 ml  ?Net -500 ml  ? ? ?Assessment/Plan: 86 year old BF with multiple medical issues to include CKD-  crt in mid to high 1's-  admit with CP and decreased EF but many other hospital complications as well including A on CRF ?1.Renal- baseline stage 4 CKD but had been fairly stable-  now with A on CRF in the setting of decreased EF and soft BP-  u/s negative- urine pretty bland-  no protein.  feel that A on crf is hemodynamically mediated with low BP this admit.  Dr. Caryl Comes had decreased her BP meds appropriately-  crt was trying to plateau it seemed after worsening but then worsened again-   then with dye load /cath 3/29.  BUN and crt worsening since dye but no absolute indications for RRT and I really hope that we will be able to avoid it in her.  Numbers only a little worse today and clinically she is much improved-  hoping for plateau before improvement in renal function  ?2. Hypertension/volume  - She was getting fluids 3/27 but since have been giving her lasix-  lungs yesterday when she is on her left side revealed crackles on left -  also wheezing-  has responded to breathing tx in the past-  no inc O2 req-   increased lasix to 80 q 12 with good response ?3. Anemia  - hgb just under 10- ten dropped req transfusion on 3/29-   iron stores low,  repleting-  will also  add ESA ?4. ID-  thought to have PNA- on Unasyn and azithro -  needs pulmonary toilet ?5. Nausea-  thought to be just residual from her other illness -  reglan and antiemetics -  better-  BUN not high enough to cause  ?6. CAD-  s/p cath with intervention on 3/29- clinically improved ? ? ?Louis Meckel  ? ? ?Labs: ?Basic Metabolic Panel: ?Recent Labs  ?Lab 12/29/21 ?9983 12/30/21 ?3825 12/31/21 ?0539  ?NA 136 138 138  ?K 4.3 3.8 3.9  ?CL 106 107 105  ?CO2 18* 19* 21*  ?GLUCOSE 110* 191* 121*  ?BUN 61* 67* 65*  ?CREATININE 3.90* 4.40* 4.63*  ?CALCIUM 8.8* 8.2* 8.7*  ? ?Liver Function Tests: ?Recent Labs  ?Lab 12/29/21 ?7673 12/30/21 ?4193 12/31/21 ?7902  ?AST 129* 104* 77*  ?ALT 89* 105* 104*  ?ALKPHOS 101 148* 146*  ?BILITOT 1.7* 1.4* 1.1  ?PROT 6.1* 6.5 6.2*  ?ALBUMIN 2.9* 2.9* 2.8*  ? ?Recent Labs  ?Lab 12/25/21 ?0017  ?LIPASE 31  ? ?No results for input(s): AMMONIA in the last 168 hours. ?CBC: ?Recent Labs  ?Lab 12/25/21 ?0017 12/26/21 ?  0139 12/27/21 ?9528 12/28/21 ?0109 12/28/21 ?1357 12/28/21 ?1400 12/28/21 ?1406 12/29/21 ?4132  ?WBC 12.6* 12.0* 11.5* 10.0  --   --   --  12.0*  ?HGB 10.8* 9.7* 9.7* 9.3*   < > 6.8* 9.5* 8.9*  ?HCT 32.1* 27.9* 28.2* 26.9*   < > 20.0* 28.0* 26.0*  ?MCV 80.5 79.5* 79.7* 80.1  --   --   --  80.0  ?PLT 136* 124* 147* 151  --   --   --  145*  ? < > = values in this interval not displayed.  ? ?Cardiac Enzymes: ?No results for input(s): CKTOTAL, CKMB, CKMBINDEX, TROPONINI in the last 168 hours. ?CBG: ?Recent Labs  ?Lab 12/30/21 ?0629 12/30/21 ?1114 12/30/21 ?1613 12/30/21 ?2109 12/31/21 ?0615  ?GLUCAP 165* 178* 151* 173* 115*  ? ? ?Iron Studies:  ?No results for input(s): IRON, TIBC, TRANSFERRIN, FERRITIN in the last 72 hours. ? ?Studies/Results: ?No results found. ?Medications: ?Infusions: ? sodium chloride 10 mL/hr at 12/23/21 1053  ? sodium chloride    ? ferric gluconate (FERRLECIT) IVPB 125 mg (12/30/21 1206)  ? nitroGLYCERIN Stopped (12/28/21 1437)   ? ? ?Scheduled Medications: ? apixaban  2.5 mg Oral BID  ? aspirin EC  81 mg Oral Daily  ? atorvastatin  80 mg Oral Daily  ? clopidogrel  75 mg Oral Q breakfast  ? fluticasone furoate-vilanterol  1 puff Inhalation Daily  ? furosemide  80 mg Intravenous Q12H  ? hydrALAZINE  25 mg Oral TID  ? insulin aspart  0-5 Units Subcutaneous QHS  ? insulin aspart  0-9 Units Subcutaneous TID WC  ? latanoprost  1 drop Both Eyes QHS  ? montelukast  10 mg Oral QHS  ? ondansetron (ZOFRAN) IV  4 mg Intravenous Q6H  ? pantoprazole  40 mg Oral BID  ? sodium chloride flush  3 mL Intravenous Q12H  ? sodium chloride flush  3 mL Intravenous Q12H  ? sodium chloride flush  3 mL Intravenous Q12H  ? ? have reviewed scheduled and prn medications. ? ?Physical Exam: ?General:  much improved -  moving around more ?Heart: RRR ?Lungs:  wheezes and congestion improved  ?Abdomen: soft,  non tender ?Extremities: some  peripheral edema  ? ? ? ?12/31/2021,9:02 AM ? LOS: 8 days  ? ?  ? ? ? ? ?

## 2021-12-31 NOTE — Progress Notes (Signed)
Notified by nursing re: patient chest discomfort. She had a busy morning this morning with cardiac rehab and PT. While at rest a short while ago she developed a fairly focal left breast discomfort described as a pinch or flutter, very vague, mild, no accompanying symptoms. VSS, normal O2 sat. The discomfort lasted about 5 minutes and resolved without intervention. This was not like prior angina per her report. EKG shows atrial fib 62bpm with ventricular pacing and no acute change from prior. Exam shows elderly AAF in NAD, pleasant affect, alert and oriented, irregularly irregular rhythm and clear lungs with no labored breathing or signs of discomfort. D/w Dr. Harrington Challenger. Given atypical symptom, short duration, and no acute changes in hemodynamics, will continue to monitor for now without additional acute testing but follow closely for recurrence. Patient aware to notify for any recurrent symptoms. ?

## 2021-12-31 NOTE — Evaluation (Signed)
Occupational Therapy Evaluation ?Patient Details ?Name: [name] ?MRN: [id number] ?DOB: 1936-04-07 ?Today's Date: 12/31/2021 ? ? ?History of Present Illness 86 y.o. female who was transferred to [facility] from [place] ED 12/23/21  for continuous onset of chest pain complained about persistent feeling of nausea and frequent dry heaves for 3 days.  Was admitted by cardiology service placed on heparin drip for NSTEMI and hospitalist team was consulted for nausea evaluation. s/p 3/29 Heart Cath and Coronary Angiography PMH: chronic systolic CHF LVEF 00-92%, PAF on Eliquis, IDDM, HTN, HLD, CKD stage IIIb, GERD, COPD Gold stage II, Heart block on PPM,  ? ?Clinical Impression ?  ?Patient admitted for the diagnosis above.  PTA her daughter stays with her and can assist as needed.  Typically she walks without an AD, and needed no assist with bathing and dressing.  She does help with meals and light home management.  Primary deficit is decreased activity tolerance and mild weakness.  Currently she is needing generalized Min A for mobility and ADL completion from a sit/stand level. OT will follow in the acute setting, and HH OT can be considered.     ?   ? ?Recommendations for follow up therapy are one component of a multi-disciplinary discharge planning process, led by the attending physician.  Recommendations may be updated based on patient status, additional functional criteria and insurance authorization.  ? ?Follow Up Recommendations ? Home health OT  ?  ?Assistance Recommended at Discharge Intermittent Supervision/Assistance  ?Patient can return home with the following A little help with bathing/dressing/bathroom;Assistance with cooking/housework;Help with stairs or ramp for entrance;Assist for transportation ? ?  ?Functional Status Assessment ? Patient has had a recent decline in their functional status and demonstrates the ability to make significant improvements in function in a reasonable and predictable amount of time.   ?Equipment Recommendations ? Tub/shower seat  ?  ?Recommendations for Other Services   ? ? ?  ?Precautions / Restrictions Restrictions ?Weight Bearing Restrictions: No ?RUE Weight Bearing: Non weight bearing  ? ?  ? ?Mobility Bed Mobility ?Overal bed mobility: Needs Assistance ?Bed Mobility: Supine to Sit ?  ?  ?Supine to sit: Supervision ?  ?  ?General bed mobility comments: supervision for safety, use of L UE on bed rail to pull to EoB ?  ? ?Transfers ?Overall transfer level: Needs assistance ?Equipment used: Rolling walker (2 wheels) ?Transfers: Sit to/from Stand ?Sit to Stand: Min guard ?  ?  ?  ?  ?  ?  ?  ? ?  ?Balance Overall balance assessment: Needs assistance ?Sitting-balance support: Feet supported, No upper extremity supported ?Sitting balance-Leahy Scale: Good ?  ?  ?Standing balance support: During functional activity, Reliant on assistive device for balance ?Standing balance-Leahy Scale: Fair ?Standing balance comment: requries UE support for dynamic activity ?  ?  ?  ?  ?  ?  ?  ?  ?  ?  ?  ?   ? ?ADL either performed or assessed with clinical judgement  ? ?ADL   ?  ?  ?Grooming: Wash/dry hands;Wash/dry face;Min guard;Standing ?  ?  ?  ?  ?  ?Upper Body Dressing : Min guard;Sitting ?  ?Lower Body Dressing: Minimal assistance;Sit to/from stand ?  ?  ?  ?  ?  ?  ?  ?  ?   ? ? ? ?Vision Patient Visual Report: No change from baseline ?   ?   ?Perception Perception ?Perception: Not tested ?  ?Praxis Praxis ?  Praxis: Not tested ?  ? ?Pertinent Vitals/Pain Pain Assessment ?Pain Assessment: No/denies pain  ? ? ? ?Hand Dominance Right ?  ?Extremity/Trunk Assessment Upper Extremity Assessment ?Upper Extremity Assessment: Generalized weakness ?  ?Lower Extremity Assessment ?Lower Extremity Assessment: Defer to PT evaluation ?  ?Cervical / Trunk Assessment ?Cervical / Trunk Assessment: Kyphotic ?  ?Communication Communication ?Communication: No difficulties ?  ?Cognition Arousal/Alertness: Awake/alert ?Behavior  During Therapy: [facility] for tasks assessed/performed ?Overall Cognitive Status: Within Functional Limits for tasks assessed ?  ?  ?  ?  ?  ?  ?  ?  ?  ?  ?  ?  ?  ?  ?  ?  ?  ?  ?  ?General Comments   VSS on RA ? ?  ?Exercises   ?  ?Shoulder Instructions    ? ? ?Home Living Family/patient expects to be discharged to:: Private residence ?Living Arrangements: Children ?Available Help at Discharge: Family;Available 24 hours/day ?Type of Home: House ?Home Access: Stairs to enter ?Entrance Stairs-Number of Steps: 1 ?  ?Home Layout: One level ?  ?  ?Bathroom Shower/Tub: Tub/shower unit ?  ?Bathroom Toilet: Standard ?Bathroom Accessibility: Yes ?How Accessible: Accessible via walker ?Home Equipment: Kasandra Knudsen - single point ?  ?  ?  ? ?  ?Prior Functioning/Environment Prior Level of Function : Driving;Independent/Modified Independent ?  ?  ?  ?  ?  ?  ?  ?  ?  ? ?  ?  ?OT Problem List: Decreased strength;Impaired balance (sitting and/or standing);Decreased activity tolerance ?  ?   ?OT Treatment/Interventions: Self-care/ADL training;Balance training;Patient/family education;Therapeutic activities;DME and/or AE instruction  ?  ?OT Goals(Current goals can be found in the care plan section) Acute Rehab OT Goals ?Patient Stated Goal: Return home ?OT Goal Formulation: With patient ?Time For Goal Achievement: 01/14/22 ?Potential to Achieve Goals: Good ?ADL Goals ?Pt Will Perform Grooming: with set-up;standing ?Pt Will Perform Upper Body Dressing: with set-up;sitting ?Pt Will Perform Lower Body Dressing: with set-up;sit to/from stand ?Pt Will Transfer to Toilet: with modified independence;ambulating;regular height toilet ?Pt Will Perform Toileting - Clothing Manipulation and hygiene: with modified independence;sit to/from stand ?Pt/caregiver will Perform Home Exercise Program: Increased strength;Both right and left upper extremity;With theraband;With written HEP provided;With Supervision  ?OT Frequency: Min 2X/week ?  ? ?Co-evaluation   ?   ?  ?  ?  ? ?  ?AM-PAC OT "6 Clicks" Daily Activity     ?Outcome Measure Help from another person eating meals?: None ?Help from another person taking care of personal grooming?: A Little ?Help from another person toileting, which includes using toliet, bedpan, or urinal?: A Little ?Help from another person bathing (including washing, rinsing, drying)?: A Little ?Help from another person to put on and taking off regular upper body clothing?: A Little ?Help from another person to put on and taking off regular lower body clothing?: A Little ?6 Click Score: 19 ?  ?End of Session Equipment Utilized During Treatment: Rolling walker (2 wheels) ?Nurse Communication: Mobility status ? ?Activity Tolerance: Patient tolerated treatment well ?Patient left: in bed;with call bell/phone within reach ? ?OT Visit Diagnosis: Unsteadiness on feet (R26.81)  ?              ?Time: 1348-1410 ?OT Time Calculation (min): 22 min ?Charges:  OT General Charges ?$OT Visit: 1 Visit ?OT Evaluation ?$OT Eval Moderate Complexity: 1 Mod ? ?12/31/2021 ? ?RP, OTR/L ? ?Acute Rehabilitation Services ? ?Office:  [phone] ? ? ?Cristiana Yochim D Karyna Bessler ?12/31/2021, 2:54 PM ?

## 2021-12-31 NOTE — Progress Notes (Signed)
? ?Progress Note ? ?Patient Name: [name] ?Date of Encounter: 12/31/2021 ? ?[facility] Cardiologist: Dorris Carnes, MD  ? ?Subjective  ?\ ?Pt feels a better from yesteday morning     No SOB  NO CP    BMs improved   NOt normal yet  ? ?Inpatient Medications  ?  ?Scheduled Meds: ? apixaban  2.5 mg Oral BID  ? aspirin EC  81 mg Oral Daily  ? atorvastatin  80 mg Oral Daily  ? clopidogrel  75 mg Oral Q breakfast  ? fluticasone furoate-vilanterol  1 puff Inhalation Daily  ? furosemide  80 mg Intravenous Q12H  ? hydrALAZINE  25 mg Oral TID  ? insulin aspart  0-5 Units Subcutaneous QHS  ? insulin aspart  0-9 Units Subcutaneous TID WC  ? latanoprost  1 drop Both Eyes QHS  ? montelukast  10 mg Oral QHS  ? ondansetron (ZOFRAN) IV  4 mg Intravenous Q6H  ? pantoprazole  40 mg Oral BID  ? sodium chloride flush  3 mL Intravenous Q12H  ? sodium chloride flush  3 mL Intravenous Q12H  ? sodium chloride flush  3 mL Intravenous Q12H  ? ?Continuous Infusions: ? sodium chloride 10 mL/hr at 12/23/21 1053  ? sodium chloride    ? ferric gluconate (FERRLECIT) IVPB 125 mg (12/30/21 1206)  ? nitroGLYCERIN Stopped (12/28/21 1437)  ? ?PRN Meds: ?sodium chloride, acetaminophen, albuterol, albuterol, bisacodyl, metoCLOPramide (REGLAN) injection, nitroGLYCERIN, sodium chloride flush, white petrolatum  ? ?Vital Signs  ?  ?Vitals:  ? 12/30/21 1112 12/30/21 1611 12/30/21 1939 12/31/21 0004  ?BP: 118/60 126/63 128/86 125/69  ?Pulse: 67 61 62 60  ?Resp: 20 19 20 20   ?Temp: 97.8 ?F (36.6 ?C)  98.1 ?F (36.7 ?C) (!) 97.4 ?F (36.3 ?C)  ?TempSrc: Oral  Oral Oral  ?SpO2: 100% 99% 98% 99%  ?Weight:    82.5 kg  ?Height:      ? ? ?Intake/Output Summary (Last 24 hours) at 12/31/2021 0720 ?Last data filed at 12/30/2021 2200 ?Gross per 24 hour  ?Intake 3 ml  ?Output 200 ml  ?Net -197 ml  ? ? ?  12/31/2021  ? 12:04 AM 12/30/2021  ?  5:00 AM 12/26/2021  ?  6:40 AM  ?Last 3 Weights  ?Weight (lbs) 181 lb 14.1 oz 177 lb 14.6 oz 178 lb 5.6 oz  ?Weight (kg) 82.5 kg 80.7 kg  80.9 kg  ?   ? ?Telemetry  ?  ?Atrial fib Paced - Personally Reviewed ? ?ECG  ?  ?No new - Personally Reviewed ? ?Physical Exam  ? ?GEN: No acute distress.  ?Neck: No JVD ?Cardiac: RRR, no murmurs,  ?Respiratory: Clear to auscultation bilaterally,  ?GI: Soft, nontender, non-distended  ?Ext  NO  LE edema ?Neuro:  Nonfocal  ?Psych: Normal affect  ? ?Labs  ?  ?High Sensitivity Troponin:   ?Recent Labs  ?Lab 12/23/21 ?0426 12/23/21 ?0617 12/27/21 ?6160 12/27/21 ?1118  ?TROPONINIHS 187* 346* 2,797* 2,716*  ?   ?Chemistry ?Recent Labs  ?Lab 12/27/21 ?7371 12/28/21 ?0109 12/28/21 ?1357 12/29/21 ?0626 12/30/21 ?9485 12/31/21 ?4627  ?NA 137 137   < > 136 138 138  ?K 4.5 4.0   < > 4.3 3.8 3.9  ?CL 108 106  --  106 107 105  ?CO2 18* 18*  --  18* 19* 21*  ?GLUCOSE 180* 148*  --  110* 191* 121*  ?BUN 59* 62*  --  61* 67* 65*  ?CREATININE 3.21* 3.49*  --  3.90* 4.40* 4.63*  ?CALCIUM 9.0 8.9  --  8.8* 8.2* 8.7*  ?MG 2.2 2.1  --  2.2  --   --   ?PROT 6.5 6.1*  --  6.1* 6.5 6.2*  ?ALBUMIN 3.2* 3.0*  --  2.9* 2.9* 2.8*  ?AST 36 44*  --  129* 104* 77*  ?ALT 28 38  --  89* 105* 104*  ?ALKPHOS 85 84  --  101 148* 146*  ?BILITOT 1.7* 1.7*  --  1.7* 1.4* 1.1  ?GFRNONAA 14* 12*  --  11* 9* 9*  ?ANIONGAP 11 13  --  12 12 12   ? < > = values in this interval not displayed.  ?  ?Lipids  ?No results for input(s): CHOL, TRIG, HDL, LABVLDL, LDLCALC, CHOLHDL in the last 168 hours. ?  ?Hematology ?Recent Labs  ?Lab 12/27/21 ?7096 12/28/21 ?0109 12/28/21 ?1357 12/28/21 ?1400 12/28/21 ?1406 12/29/21 ?2836  ?WBC 11.5* 10.0  --   --   --  12.0*  ?RBC 3.54* 3.36*  --   --   --  3.25*  ?HGB 9.7* 9.3*   < > 6.8* 9.5* 8.9*  ?HCT 28.2* 26.9*   < > 20.0* 28.0* 26.0*  ?MCV 79.7* 80.1  --   --   --  80.0  ?MCH 27.4 27.7  --   --   --  27.4  ?MCHC 34.4 34.6  --   --   --  34.2  ?RDW 15.3 15.9*  --   --   --  15.7*  ?PLT 147* 151  --   --   --  145*  ? < > = values in this interval not displayed.  ? ?Thyroid  ?No results for input(s): TSH, FREET4 in the last 168  hours. ?  ?BNP ?Recent Labs  ?Lab 12/27/21 ?6294 12/28/21 ?0109 12/29/21 ?7654  ?BNP 1,711.2* 1,969.3* 2,207.8*  ?  ?DDimer No results for input(s): DDIMER in the last 168 hours.  ? ?Radiology  ?  ?No results found. ? ?Cardiac Studies  ? ?Right/Left Heart Cath 12/28/21 ?  Mid RCA lesion is 30% stenosed. ?  Dist RCA lesion is 50% stenosed. ?  1st Diag lesion is 90% stenosed. ?  Prox LAD to Mid LAD lesion is 95% stenosed. ?  A drug-eluting stent was successfully placed using a SYNERGY XD 3.0X16. ?  Balloon angioplasty was performed using a BALLN SAPPHIRE 2.0X12. ?  Post intervention, there is a 0% residual stenosis. ?  Post intervention, there is a 30% residual stenosis. ?  ?Severe proximal LAD stenosis involving a moderate caliber diagonal branch.  ?Successful PTCA/DES x 1 proximal LAD ?Successful balloon angioplasty Diagonal 1 ?No obstructive disease in the Circumflex artery ?Moderate non-obstructive disease in the dominant RCA ?Elevated right and left heart pressures.  ?  ?Recommendations: Continue DAPT with ASA and Plavix for one month and can then stop ASA since she will also be on Eliquis. She remains volume overload. Will need continued diuresis with caution given renal insufficiency.  ? ?Diagnostic ?Dominance: Right ?Intervention ? ? ? ? ? ?Patient Profile  ?   ?86 y.o. female with a history of diabetes mellitus, hyperlipidemia, complete heart block s/p pacemaker, paroxysmal atrial fibrillation/flutter and mod CAD (CT in 2017 with calcium scoreof  51  mod dz LAD; filling defect in LAA), and chronic kidney disease stage IV admitted for chest pain.  Course complicated by persistent nausea with dry heaves>> being followed by GI. Possible  ?  ?Also with worsening renal  function>> being followed by nephrology.  Echo with new LV dysfunction ? ?Assessment & Plan  ?  ?NSTEMI ?-Patient was not feeling well last week and then started to having chest pain. Initial Hs-troponin 187>>346.  Echo showed newly reduced LV  function with wall motion abnormality.  Taken to Cath Lab 3/24 but case aborted due to acute dyspnea with hypoxia.  Now patient has worsening renal function.  Later developed worsening chest pain on 3/28 with Hs-troponin 2797>>2716.  Felt like missed troponin peaked in between. ?- Patient underwent L/R heart cath 3/29 with successful PTCA/DES to the proximal LAD, successful balloon angioplasty to diagonal 1. Patient had elevated Right and Left Heart pressures  ?- Per cath report, recommended to continue DAPT with ASA and Plavix for 1 month then to stop ASA as patient will be on Eliquis ? ?SPell yesterday am (early) improved    Follow   ? Cause   ? ?Acute systolic heart failure ?-Echocardiogram showed newly reduced LV function at 30 to 35% with regional wall motion abnormality.  ? Aborted cardiac cath on 3/24 due to acute dyspnea with hypoxia.  ?Final cath on 3/29 ?-Patient will need guideline directed medical therapy.  No ACE/ARB/Entresto due to worsening renal function. ?-Continue hydralazine 25 mg 3 times daily ?Volume status is not too bad      Getting lasix IV 80 bid as directed by nephrology    ?Echo in several months to follow up on LVEF  now that she has been revascluarized  ? ?Atrial Flutter ?- Telemetry showed atrial flutter (atypical), ventricular paced    ?ON ELiquis   ?NSVT  ?NO recurrent events   ? ?Acute on chronic kidney disease stage IV ?- Scr slightly worsened again  ?- Baseline 1.8   Today it is 4.63   Increased after cath     ?- Nephrology consulted, appreciate their assistance  ?  ?Possible pneumonia ?COPD ?- Finished course of ABX   ? ?  ?DM ?- A1c 5.4 ?- On SSI  ?  ?GI   Improving  ? ?For questions or updates, please contact [facility] ?Please consult [email or web address] for contact info under  ? ?   ?Signed, ?Dorris Carnes, MD  ?12/31/2021, 7:20 AM   ? ?

## 2021-12-31 NOTE — Plan of Care (Signed)
  Problem: Education: Goal: Knowledge of General Education information will improve Description: Including pain rating scale, medication(s)/side effects and non-pharmacologic comfort measures Outcome: Progressing   Problem: Health Behavior/Discharge Planning: Goal: Ability to manage health-related needs will improve Outcome: Progressing   Problem: Clinical Measurements: Goal: Diagnostic test results will improve Outcome: Progressing Goal: Respiratory complications will improve Outcome: Progressing Goal: Cardiovascular complication will be avoided Outcome: Progressing   Problem: Activity: Goal: Risk for activity intolerance will decrease Outcome: Progressing   

## 2021-12-31 NOTE — Progress Notes (Addendum)
Physical Therapy Treatment ?Patient Details ?Name: [name] ?MRN: [id number] ?DOB: Apr 28, 1936 ?Today's Date: 12/31/2021 ? ? ?History of Present Illness 86 y.o. female who was transferred to [facility] from [place] ED 12/23/21  for continuous onset of chest pain complained about persistent feeling of nausea and frequent dry heaves for 3 days.  Was admitted by cardiology service placed on heparin drip for NSTEMI and hospitalist team was consulted for nausea evaluation. s/p 3/29 Heart Cath and Coronary Angiography PMH: chronic systolic CHF LVEF 95-28%, PAF on Eliquis, IDDM, HTN, HLD, CKD stage IIIb, GERD, COPD Gold stage II, Heart block on PPM, ? ?  ?PT Comments  ? ? Pt supine in bed with lights out on entry, requiring increased encouragement for stair training today. Pt does report need to use BSC so willing to get up. Pt requires encouragement for R UE usage as cardiac cath was 4 days ago. Pt is supervision for bed mobility, min guard for transfers and 2 foot ambulation with RW. Pt requires min A for steadying to step up and back from one step x 4. PT educated pt and daughter on need for HHPT and DME given pt continued decreased strength and endurance. Pt currently unable to ambulate far enough to get to bathroom and back necessitating BSC for home use. PT and daughter agreeable to HHPT and DME.  ?   ?Recommendations for follow up therapy are one component of a multi-disciplinary discharge planning process, led by the attending physician.  Recommendations may be updated based on patient status, additional functional criteria and insurance authorization. ? ?Follow Up Recommendations ? Home health PT ?  ?  ?Assistance Recommended at Discharge Frequent or constant Supervision/Assistance  ?Patient can return home with the following A lot of help with walking and/or transfers;A lot of help with bathing/dressing/bathroom;Assistance with cooking/housework;Assistance with feeding;Direct supervision/assist for medications  management;Direct supervision/assist for financial management;Assist for transportation;Help with stairs or ramp for entrance ?  ?Equipment Recommendations ? Rolling walker (2 wheels);BSC/3in1  ?  ?Recommendations for Other Services OT consult ? ? ?  ?Precautions / Restrictions Precautions ?Precautions: Other (comment) (R radial heart cath,) ?Precaution Comments: No pushing through R arm for 24 hrs ?Restrictions ?Weight Bearing Restrictions: No  ?  ? ?Mobility ? Bed Mobility ?Overal bed mobility: Needs Assistance ?Bed Mobility: Supine to Sit ?  ?  ?Supine to sit: Supervision ?  ?  ?General bed mobility comments: supervision for safety, use of L UE on bed rail to pull to EoB ?  ? ?Transfers ?Overall transfer level: Needs assistance ?Equipment used: 1 person hand held assist ?Transfers: Sit to/from Stand, Bed to chair/wheelchair/BSC ?Sit to Stand: Min guard, Mod assist ?  ?Step pivot transfers: Min guard ?  ?  ?  ?General transfer comment: min guard for safety, encouraged use of R UE for support and power up from bed and BSC, pt requiring modA for return to sitting after stair training as pt with did not back up to bed or reach back for bed to sit. Provided increased education on need for safely returning to seat even if she is very fatigued to reduce risk of ending up on the floor. ?  ? ?Ambulation/Gait ?Ambulation/Gait assistance: Min guard ?Gait Distance (Feet): 2 Feet ?Assistive device: Rolling walker (2 wheels) ?Gait Pattern/deviations: Step-through pattern, Decreased step length - right, Decreased step length - left, Trunk flexed, Shuffle ?Gait velocity: slowed ?Gait velocity interpretation: <1.31 ft/sec, indicative of household ambulator ?  ?General Gait Details: min guard for safety and  line management ? ? ?Stairs ?Stairs: Yes ?Stairs assistance: Min assist ?Stair Management: Two rails, Step to pattern, Forwards, Backwards ?Number of Stairs: 1 (x4) ?General stair comments: Pt able to step forward and back up  one step x4 exhibiting strength to be able to get into home. ? ? ?  ? ? ?  ?Balance Overall balance assessment: Needs assistance ?Sitting-balance support: Feet supported, No upper extremity supported ?Sitting balance-Leahy Scale: Good ?  ?  ?Standing balance support: During functional activity, Reliant on assistive device for balance ?Standing balance-Leahy Scale: Fair ?Standing balance comment: requries UE support for dynamic activity ?  ?  ?  ?  ?  ?  ?  ?  ?  ?  ?  ?  ? ?  ?Cognition Arousal/Alertness: Awake/alert ?Behavior During Therapy: Flat affect ?Overall Cognitive Status: Within Functional Limits for tasks assessed ?  ?  ?  ?  ?  ?  ?  ?  ?  ?  ?  ?  ?  ?  ?  ?  ?  ?  ?  ? ?  ?   ?General Comments General comments (skin integrity, edema, etc.): Pt on 2L O2 via  on entry SpO2 98%O2, removed and pt able to maintain SpO2 >96%O2 even with stair training, VSS ?  ?  ? ?Pertinent Vitals/Pain Pain Assessment ?Pain Assessment: No/denies pain  ? ? ? ?PT Goals (current goals can now be found in the care plan section) Acute Rehab PT Goals ?Patient Stated Goal: go home ?PT Goal Formulation: With patient/family ?Time For Goal Achievement: 01/13/22 ?Potential to Achieve Goals: Fair ? ?  ?Frequency ? ? ? Min 3X/week ? ? ? ?  ?PT Plan Current plan remains appropriate  ? ? ?   ?AM-PAC PT "6 Clicks" Mobility   ?Outcome Measure ? Help needed turning from your back to your side while in a flat bed without using bedrails?: None ?Help needed moving from lying on your back to sitting on the side of a flat bed without using bedrails?: None ?Help needed moving to and from a bed to a chair (including a wheelchair)?: A Lot ?Help needed standing up from a chair using your arms (e.g., wheelchair or bedside chair)?: A Lot ?Help needed to walk in hospital room?: A Lot ?Help needed climbing 3-5 steps with a railing? : Total ?6 Click Score: 15 ? ?  ?End of Session Equipment Utilized During Treatment: Gait belt ?Activity Tolerance: Patient  limited by fatigue ?Patient left: in chair;with call bell/phone within reach;with family/visitor present ?Nurse Communication: Mobility status ?PT Visit Diagnosis: Unsteadiness on feet (R26.81);Other abnormalities of gait and mobility (R26.89);Muscle weakness (generalized) (M62.81);Difficulty in walking, not elsewhere classified (R26.2) ?  ? ? ?Time: 1448-1856 ?PT Time Calculation (min) (ACUTE ONLY): 25 min ? ?Charges:  $Gait Training: 8-22 mins ?$Therapeutic Activity: 8-22 mins          ?          ? ?Clary Boulais B. Migdalia Dk PT, DPT ?Acute Rehabilitation Services ?Pager [phone] ?Office [phone] ? ? ? ?[place] ?12/31/2021, 10:52 AM ? ?

## 2021-12-31 NOTE — TOC Transition Note (Signed)
Transition of Care (TOC) - CM/SW Discharge Note ? ? ?Patient Details  ?Name: [name] ?MRN: [id number] ?Date of Birth: May 04, 1936 ? ?Transition of Care (TOC) CM/SW Contact:  ?Yliana Gravois G., RN ?Phone Number: ?12/31/2021, 1:31 PM ? ?Clinical Narrative:    ? ?86 y.o. female with a hx of DM, HLD, complete heart block - s/p pacer  who is being seen 12/23/2021 for the evaluation of  acute coronary syndrome   ?RNCM received orders for DME and [facility] services.  RNCM spoke to patient to offer choice for [facility] services and DME.  Patient with no preference so Jasmine at [facility] contacted for DME-RW and 3 n 1 delivered to patient's room.  Georgina Snell at [place] contacted for [facility] services ordered, PT and RN.  Services accepted. ? ? ?  ?Barriers to Discharge: Continued Medical Work up ? ? ?Patient Goals and CMS Choice ?Patient states their goals for this hospitalization and ongoing recovery are:: Wants to get better to go home ?  ?Choice offered to / list presented to : NA ? ?Discharge Placement ?  ?           ?  ?  ?  ?  ? ?Discharge Plan and Services ?In-house Referral: NA ?Discharge Planning Services: CM Consult ?Post Acute Care Choice: NA          ?DME Arranged: N/A ?DME Agency: NA ?  ?  ?  ?HH Arranged: NA ?[place] Agency: NA ?  ?  ?  ? ?Social Determinants of Health (SDOH) Interventions ?  ? ? ?Readmission Risk Interventions ? ?  12/30/2021  ?  3:40 PM  ?Readmission Risk Prevention Plan  ?Transportation Screening Complete  ?PCP or Specialist Appt within 3-5 Days Complete  ?[place] or Home Care Consult Complete  ?Social Work Consult for [place] Planning/Counseling Complete  ?Palliative Care Screening Complete  ?Medication Review Press photographer) Referral to Pharmacy  ? ? ? ? ? ?

## 2021-12-31 NOTE — Progress Notes (Signed)
Pt complains of "weird feeling" in chest.Upon assessment pt is relaxed and describing feeling in chest as a "pinch" "flutter" .  ?BP 124/65   Pulse 60   Temp 97.8 ?F (36.6 ?C) (Oral)   Resp 18   Ht 5\' 7"  (1.702 m)   Wt 82.5 kg   SpO2 97%   BMI 28.49 kg/m?  ?MD notified  ?EKG preformed and in chart  ?

## 2021-12-31 NOTE — Progress Notes (Signed)
CARDIAC REHAB PHASE I  ? ?PRE:  Rate/Rhythm: 60 SR ? ?BP:  Sitting: 136/68  ?  ?  SaO2: 100 3L ? ?MODE:  Ambulation: 10 ft  ? ?POST:  Rate/Rhythm: 70 SR ? ?BP:  Sitting: 131/67   ? ?  SaO2: 100 4L ? ?Pt tolerated exercise fair and amb 10 ft with  walker , and minimal . Pt denies CP, SOB, or dizziness throughout walk. Went over with pt and daughter MI, stent, Plavix, low sodium, and daily wts. Will continue to follow. ? [phone] ?Sheppard Plumber, MS, ACSM-CEP ?12/31/2021 ?8:17 AM ? ?   ?

## 2022-01-01 DIAGNOSIS — I214 Non-ST elevation (NSTEMI) myocardial infarction: Secondary | ICD-10-CM | POA: Diagnosis not present

## 2022-01-01 LAB — RENAL FUNCTION PANEL
Albumin: 2.7 g/dL — ABNORMAL LOW (ref 3.5–5.0)
Anion gap: 10 (ref 5–15)
BUN: 61 mg/dL — ABNORMAL HIGH (ref 8–23)
CO2: 21 mmol/L — ABNORMAL LOW (ref 22–32)
Calcium: 8.6 mg/dL — ABNORMAL LOW (ref 8.9–10.3)
Chloride: 105 mmol/L (ref 98–111)
Creatinine, Ser: 4.71 mg/dL — ABNORMAL HIGH (ref 0.44–1.00)
GFR, Estimated: 9 mL/min — ABNORMAL LOW (ref >60)
Glucose, Bld: 133 mg/dL — ABNORMAL HIGH (ref 70–99)
Phosphorus: 3.3 mg/dL (ref 2.5–4.6)
Potassium: 3.6 mmol/L (ref 3.5–5.1)
Sodium: 136 mmol/L (ref 135–145)

## 2022-01-01 LAB — CBC
HCT: 25 % — ABNORMAL LOW (ref 36.0–46.0)
Hemoglobin: 8.8 g/dL — ABNORMAL LOW (ref 12.0–15.0)
MCH: 27.6 pg (ref 26.0–34.0)
MCHC: 35.2 g/dL (ref 30.0–36.0)
MCV: 78.4 fL — ABNORMAL LOW (ref 80.0–100.0)
Platelets: 140 10*3/uL — ABNORMAL LOW (ref 150–400)
RBC: 3.19 MIL/uL — ABNORMAL LOW (ref 3.87–5.11)
RDW: 16.5 % — ABNORMAL HIGH (ref 11.5–15.5)
WBC: 10.9 10*3/uL — ABNORMAL HIGH (ref 4.0–10.5)
nRBC: 19.2 % — ABNORMAL HIGH (ref 0.0–0.2)

## 2022-01-01 LAB — GLUCOSE, CAPILLARY
Glucose-Capillary: 114 mg/dL — ABNORMAL HIGH (ref 70–99)
Glucose-Capillary: 205 mg/dL — ABNORMAL HIGH (ref 70–99)
Glucose-Capillary: 120 mg/dL — ABNORMAL HIGH (ref 70–99)
Glucose-Capillary: 213 mg/dL — ABNORMAL HIGH (ref 70–99)

## 2022-01-01 MED ORDER — ACETAMINOPHEN 500 MG PO TABS: 1000.0000 mg | ORAL_TABLET | Freq: Four times a day (QID) | ORAL | Status: DC | PRN

## 2022-01-01 MED ORDER — FUROSEMIDE 10 MG/ML IJ SOLN
40.0000 mg | Freq: Two times a day (BID) | INTRAMUSCULAR | Status: DC
  Administered 2022-01-01 – 2022-01-03 (×6): 40 mg via INTRAVENOUS
  Filled 2022-01-01 (×5): qty 4

## 2022-01-01 MED ORDER — ACETAMINOPHEN 500 MG PO TABS
1000.0000 mg | ORAL_TABLET | Freq: Four times a day (QID) | ORAL | Status: DC | PRN
  Administered 2022-01-02 – 2022-01-03 (×3): 1000 mg via ORAL
  Filled 2022-01-01 (×3): qty 2

## 2022-01-01 MED ORDER — TRAMADOL HCL 50 MG PO TABS
50.0000 mg | ORAL_TABLET | Freq: Two times a day (BID) | ORAL | Status: AC | PRN
  Administered 2022-01-01 – 2022-01-02 (×2): 50 mg via ORAL
  Filled 2022-01-01 (×3): qty 1

## 2022-01-01 NOTE — Progress Notes (Addendum)
Notified by nurse for knee pain. This has been ongoing issue for patient. Had OP CT by ortho 3/16 but patient told nurse she had not heard the results yet. ? ?IMPRESSION: ?1. Tricompartmental degenerative changes most significant in the ?lateral compartment. ?2. Medial and lateral meniscal tears. ?3. Grossly intact ligamentous structures and no acute bony ?abnormality. ?4. Moderate synovitis involving the knee joint along with a ?moderate-sized Baker's cyst. ?  ?Tylenol not helping. D/w Dr Harrington Challenger who evaluated patient earlier today - per recs will try rx Tramadol, hold off consulting ortho acutely given this has been a recently chronic issue, and treat with pain control to start. She plans to reassess this afternoon. Can't use NSAIDS 2/2 renal disease. Will also change next Tylenol dose to 1000mg  q6hr instead of 650mg . ?

## 2022-01-01 NOTE — Progress Notes (Signed)
? ?Progress Note ? ?Patient Name: [name] ?Date of Encounter: 01/01/2022 ? ?[facility] Cardiologist: Dorris Carnes, MD  ? ?Subjective  ? ?Pt had a good night   No SOB   No CP     ?Episode yesterday brief ? ?Inpatient Medications  ?  ?Scheduled Meds: ? apixaban  2.5 mg Oral BID  ? aspirin EC  81 mg Oral Daily  ? atorvastatin  80 mg Oral Daily  ? clopidogrel  75 mg Oral Q breakfast  ? darbepoetin (ARANESP) injection - NON-DIALYSIS  150 mcg Subcutaneous Q Sat-1800  ? fluticasone furoate-vilanterol  1 puff Inhalation Daily  ? furosemide  80 mg Intravenous Q12H  ? hydrALAZINE  25 mg Oral TID  ? insulin aspart  0-5 Units Subcutaneous QHS  ? insulin aspart  0-9 Units Subcutaneous TID WC  ? latanoprost  1 drop Both Eyes QHS  ? montelukast  10 mg Oral QHS  ? ondansetron (ZOFRAN) IV  4 mg Intravenous Q6H  ? pantoprazole  40 mg Oral BID  ? sodium chloride flush  3 mL Intravenous Q12H  ? sodium chloride flush  3 mL Intravenous Q12H  ? sodium chloride flush  3 mL Intravenous Q12H  ? ?Continuous Infusions: ? sodium chloride 10 mL/hr at 12/23/21 1053  ? sodium chloride    ? ferric gluconate (FERRLECIT) IVPB 125 mg (12/31/21 0902)  ? nitroGLYCERIN Stopped (12/28/21 1437)  ? ?PRN Meds: ?sodium chloride, acetaminophen, albuterol, albuterol, bisacodyl, metoCLOPramide (REGLAN) injection, nitroGLYCERIN, sodium chloride flush, white petrolatum  ? ?Vital Signs  ?  ?Vitals:  ? 12/31/21 1649 12/31/21 2028 12/31/21 2315 01/01/22 0530  ?BP: (!) 108/53 126/64 (!) 112/57 (!) 102/50  ?Pulse: 65 62 67 71  ?Resp: 20 19 20 20   ?Temp: 98.3 ?F (36.8 ?C) 98.7 ?F (37.1 ?C) 98.2 ?F (36.8 ?C) 98.1 ?F (36.7 ?C)  ?TempSrc: Oral Oral Oral Oral  ?SpO2: 97% 99% 100% 99%  ?Weight:    82.5 kg  ?Height:      ? ? ?Intake/Output Summary (Last 24 hours) at 01/01/2022 0713 ?Last data filed at 12/31/2021 1400 ?Gross per 24 hour  ?Intake 960 ml  ?Output 780 ml  ?Net 180 ml  ? ? ?  01/01/2022  ?  5:30 AM 12/31/2021  ? 12:04 AM 12/30/2021  ?  5:00 AM  ?Last 3 Weights   ?Weight (lbs) 181 lb 14.1 oz 181 lb 14.1 oz 177 lb 14.6 oz  ?Weight (kg) 82.5 kg 82.5 kg 80.7 kg  ?   ? ?Telemetry  ?  ?Atrial fib Paced   Unchanged - Personally Reviewed ? ?ECG  ?  ?No new - Personally Reviewed ? ?Physical Exam  ? ?GEN: No acute distress.  ?Neck: No JVD ?Cardiac: RRR, no murmurs  ?Respiratory: Clear to auscultation bilaterally,  ?GI: Soft, nontender, non-distended  ?Ext  NO  LE edema ?Neuro:  Nonfocal  ?Psych: Normal affect  ? ?Labs  ?  ?High Sensitivity Troponin:   ?Recent Labs  ?Lab 12/23/21 ?0426 12/23/21 ?0617 12/27/21 ?4332 12/27/21 ?1118  ?TROPONINIHS 187* 346* 2,797* 2,716*  ?   ?Chemistry ?Recent Labs  ?Lab 12/27/21 ?9518 12/28/21 ?0109 12/28/21 ?1357 12/29/21 ?8416 12/30/21 ?6063 12/31/21 ?0160 01/01/22 ?1093  ?NA 137 137   < > 136 138 138 136  ?K 4.5 4.0   < > 4.3 3.8 3.9 3.6  ?CL 108 106  --  106 107 105 105  ?CO2 18* 18*  --  18* 19* 21* 21*  ?GLUCOSE 180* 148*  --  110* 191* 121* 133*  ?BUN 59* 62*  --  61* 67* 65* 61*  ?CREATININE 3.21* 3.49*  --  3.90* 4.40* 4.63* 4.71*  ?CALCIUM 9.0 8.9  --  8.8* 8.2* 8.7* 8.6*  ?MG 2.2 2.1  --  2.2  --   --   --   ?PROT 6.5 6.1*  --  6.1* 6.5 6.2*  --   ?ALBUMIN 3.2* 3.0*  --  2.9* 2.9* 2.8* 2.7*  ?AST 36 44*  --  129* 104* 77*  --   ?ALT 28 38  --  89* 105* 104*  --   ?ALKPHOS 85 84  --  101 148* 146*  --   ?BILITOT 1.7* 1.7*  --  1.7* 1.4* 1.1  --   ?GFRNONAA 14* 12*  --  11* 9* 9* 9*  ?ANIONGAP 11 13  --  12 12 12 10   ? < > = values in this interval not displayed.  ?  ?Lipids  ?No results for input(s): CHOL, TRIG, HDL, LABVLDL, LDLCALC, CHOLHDL in the last 168 hours. ?  ?Hematology ?Recent Labs  ?Lab 12/28/21 ?0109 12/28/21 ?1357 12/28/21 ?1406 12/29/21 ?1062 01/01/22 ?6948  ?WBC 10.0  --   --  12.0* 10.9*  ?RBC 3.36*  --   --  3.25* 3.19*  ?HGB 9.3*   < > 9.5* 8.9* 8.8*  ?HCT 26.9*   < > 28.0* 26.0* 25.0*  ?MCV 80.1  --   --  80.0 78.4*  ?MCH 27.7  --   --  27.4 27.6  ?MCHC 34.6  --   --  34.2 35.2  ?RDW 15.9*  --   --  15.7* 16.5*  ?PLT 151   --   --  145* 140*  ? < > = values in this interval not displayed.  ? ?Thyroid  ?No results for input(s): TSH, FREET4 in the last 168 hours. ?  ?BNP ?Recent Labs  ?Lab 12/27/21 ?5462 12/28/21 ?0109 12/29/21 ?7035  ?BNP 1,711.2* 1,969.3* 2,207.8*  ?  ?DDimer No results for input(s): DDIMER in the last 168 hours.  ? ?Radiology  ?  ?No results found. ? ?Cardiac Studies  ? ?Right/Left Heart Cath 12/28/21 ?  Mid RCA lesion is 30% stenosed. ?  Dist RCA lesion is 50% stenosed. ?  1st Diag lesion is 90% stenosed. ?  Prox LAD to Mid LAD lesion is 95% stenosed. ?  A drug-eluting stent was successfully placed using a SYNERGY XD 3.0X16. ?  Balloon angioplasty was performed using a BALLN SAPPHIRE 2.0X12. ?  Post intervention, there is a 0% residual stenosis. ?  Post intervention, there is a 30% residual stenosis. ?  ?Severe proximal LAD stenosis involving a moderate caliber diagonal branch.  ?Successful PTCA/DES x 1 proximal LAD ?Successful balloon angioplasty Diagonal 1 ?No obstructive disease in the Circumflex artery ?Moderate non-obstructive disease in the dominant RCA ?Elevated right and left heart pressures.  ?  ?Recommendations: Continue DAPT with ASA and Plavix for one month and can then stop ASA since she will also be on Eliquis. She remains volume overload. Will need continued diuresis with caution given renal insufficiency.  ? ?Diagnostic ?Dominance: Right ?Intervention ? ? ? ? ? ?Patient Profile  ?   ?86 y.o. female with a history of diabetes mellitus, hyperlipidemia, complete heart block s/p pacemaker, paroxysmal atrial fibrillation/flutter and mod CAD (CT in 2017 with calcium scoreof  51  mod dz LAD; filling defect in LAA), and chronic kidney disease stage IV admitted for chest pain.  Course complicated by persistent nausea with dry heaves>> being followed by GI. Possible  ?  ?Also with worsening renal function>> being followed by nephrology.  Echo with new LV dysfunction ? ?Assessment & Plan  ?  ?NSTEMI ?-Patient  was not feeling well last week and then started to having chest pain. Initial Hs-troponin 187>>346.  Echo showed newly reduced LV function with wall motion abnormality.  Taken to Cath Lab 3/24 but case aborted due to acute dyspnea with hypoxia.  Now patient has worsening renal function.  Later developed worsening chest pain on 3/28 with Hs-troponin 2797>>2716.  Felt like missed troponin peaked in between. ?- Patient underwent L/R heart cath 3/29 with successful PTCA/DES to the proximal LAD, successful balloon angioplasty to diagonal 1. Patient had elevated Right and Left Heart pressures  ?- Per cath report, recommended to continue DAPT with ASA and Plavix for 1 month then to stop ASA as patient will be on Eliquis ? ? ?Acute systolic heart failure ?-Echocardiogram showed newly reduced LV function at 30 to 35% with regional wall motion abnormality.  ? Aborted cardiac cath on 3/24 due to acute dyspnea with hypoxia.  ?Final cath  with intervention on 3/29 ?-Patient will need guideline directed medical therapy.  No ACE/ARB/Entresto due to worsening renal function. ?-Continue hydralazine 25 mg 3 times daily ? ?Echo in several months to follow up on LVEF  now that she has been revascluarized  ? ?Atrial Flutter ?- Telemetry showed atrial flutter (atypical), ventricular paced    ?ON ELiquis   ? ?NSVT  ?Short salvos , intermitt ? ?Acute on chronic kidney disease stage IV ?- Scr slightly worsened again  ?- Baseline 1.8   Today it is 4.71    Has continued to climb since cath     ?- Nephrology consulted and is following/guiding Rx    ?  ?Pulmonary ?Possible pneumonia ?COPD ?- Finished course of ABX   ? ?DM ?- A1c 5.4 ?- On SSI  ? For questions or updates, please contact [place] ?Please consult [email or web address] for contact info under  ? ?   ?Signed, ?Dorris Carnes, MD  ?01/01/2022, 7:13 AM   ? ?

## 2022-01-01 NOTE — Progress Notes (Signed)
Subjective:  feeling good now 2 days in a row-  listening to her church service -  at least [place] recorded but maybe more on lasix 80 q 12- BP and O2 sats fine -  BUN down and crt only up slightly - ? ?Objective ?Vital signs in last 24 hours: ?Vitals:  ? 12/31/21 2028 12/31/21 2315 01/01/22 0530 01/01/22 0746  ?BP: 126/64 (!) 112/57 (!) 102/50 115/61  ?Pulse: 62 67 71 60  ?Resp: 19 20 20 18   ?Temp: 98.7 ?F (37.1 ?C) 98.2 ?F (36.8 ?C) 98.1 ?F (36.7 ?C) 97.8 ?F (36.6 ?C)  ?TempSrc: Oral Oral Oral Oral  ?SpO2: 99% 100% 99% 98%  ?Weight:   82.5 kg   ?Height:      ? ?Weight change: 0 kg ? ?Intake/Output Summary (Last 24 hours) at 01/01/2022 0847 ?Last data filed at 12/31/2021 1400 ?Gross per 24 hour  ?Intake --  ?Output 480 ml  ?Net -480 ml  ? ? ?Assessment/Plan: 86 year old BF with multiple medical issues to include CKD-  crt in mid to high 1's-  admit with CP and decreased EF but many other hospital complications as well including A on CRF ?1.Renal- baseline stage 4 CKD but had been fairly stable-  now with A on CRF in the setting of decreased EF and soft BP-  u/s negative- urine pretty bland-  no protein.  feel that A on crf is hemodynamically mediated with low BP this admit.  Dr. Caryl Comes had decreased her BP meds appropriately-  crt was trying to plateau it seemed after worsening but then worsened again-   then with dye load /cath 3/29.  BUN and crt worsening since dye but no absolute indications for RRT and I really hope that we will be able to avoid it in her.  Numbers only a little worse today and clinically she is much improved-  hoping for plateau before improvement in renal function -  would like to see crt trending down before she goes home-  has seem someone at [place] in the past-  will re establish at discharge  ?2. Hypertension/volume  - She was getting fluids 3/27 but since have been giving her lasix-  lungs in the past with crackles -  also wheezing-  on breathing tx as well-   no inc O2 req-   increased lasix  to 80 q 12 with good response-  BP soft, put parameters on her low dose hydralazine-  also since volume status better will decrease lasix ?3. Anemia  - hgb just under 10- then dropped req transfusion on 3/29-   iron stores low, repleting-  also added ESA ?4. ID-  thought to have PNA- on Unasyn and azithro -  needs pulmonary toilet ?5. Nausea-  thought to be just residual from her other illness -  reglan and antiemetics -  much better-  BUN not high enough to cause I dont think ?6. CAD-  s/p cath with intervention on 3/29- clinically improved ? ? ?[name]  ? ? ?Labs: ?Basic Metabolic Panel: ?Recent Labs  ?Lab 12/30/21 ?8416 12/31/21 ?6063 01/01/22 ?0160  ?NA 138 138 136  ?K 3.8 3.9 3.6  ?CL 107 105 105  ?CO2 19* 21* 21*  ?GLUCOSE 191* 121* 133*  ?BUN 67* 65* 61*  ?CREATININE 4.40* 4.63* 4.71*  ?CALCIUM 8.2* 8.7* 8.6*  ?PHOS  --   --  3.3  ? ?Liver Function Tests: ?Recent Labs  ?Lab 12/29/21 ?1093 12/30/21 ?2355 12/31/21 ?7322 01/01/22 ?  6294  ?AST 129* 104* 77*  --   ?ALT 89* 105* 104*  --   ?ALKPHOS 101 148* 146*  --   ?BILITOT 1.7* 1.4* 1.1  --   ?PROT 6.1* 6.5 6.2*  --   ?ALBUMIN 2.9* 2.9* 2.8* 2.7*  ? ?No results for input(s): LIPASE, AMYLASE in the last 168 hours. ? ?No results for input(s): AMMONIA in the last 168 hours. ?CBC: ?Recent Labs  ?Lab 12/26/21 ?0139 12/27/21 ?7654 12/28/21 ?0109 12/28/21 ?1357 12/28/21 ?1406 12/29/21 ?6503 01/01/22 ?5465  ?WBC 12.0* 11.5* 10.0  --   --  12.0* 10.9*  ?HGB 9.7* 9.7* 9.3*   < > 9.5* 8.9* 8.8*  ?HCT 27.9* 28.2* 26.9*   < > 28.0* 26.0* 25.0*  ?MCV 79.5* 79.7* 80.1  --   --  80.0 78.4*  ?PLT 124* 147* 151  --   --  145* 140*  ? < > = values in this interval not displayed.  ? ?Cardiac Enzymes: ?No results for input(s): CKTOTAL, CKMB, CKMBINDEX, TROPONINI in the last 168 hours. ?CBG: ?Recent Labs  ?Lab 12/31/21 ?0615 12/31/21 ?1129 12/31/21 ?1644 12/31/21 ?2121 01/01/22 ?0615  ?GLUCAP 115* 180* 142* 172* 114*  ? ? ?Iron Studies:  ?No results for input(s): IRON,  TIBC, TRANSFERRIN, FERRITIN in the last 72 hours. ? ?Studies/Results: ?No results found. ?Medications: ?Infusions: ? sodium chloride 10 mL/hr at 12/23/21 1053  ? sodium chloride    ? ferric gluconate (FERRLECIT) IVPB 125 mg (12/31/21 0902)  ? nitroGLYCERIN Stopped (12/28/21 1437)  ? ? ?Scheduled Medications: ? apixaban  2.5 mg Oral BID  ? aspirin EC  81 mg Oral Daily  ? atorvastatin  80 mg Oral Daily  ? clopidogrel  75 mg Oral Q breakfast  ? darbepoetin (ARANESP) injection - NON-DIALYSIS  150 mcg Subcutaneous Q Sat-1800  ? fluticasone furoate-vilanterol  1 puff Inhalation Daily  ? furosemide  80 mg Intravenous Q12H  ? hydrALAZINE  25 mg Oral TID  ? insulin aspart  0-5 Units Subcutaneous QHS  ? insulin aspart  0-9 Units Subcutaneous TID WC  ? latanoprost  1 drop Both Eyes QHS  ? montelukast  10 mg Oral QHS  ? ondansetron (ZOFRAN) IV  4 mg Intravenous Q6H  ? pantoprazole  40 mg Oral BID  ? sodium chloride flush  3 mL Intravenous Q12H  ? sodium chloride flush  3 mL Intravenous Q12H  ? sodium chloride flush  3 mL Intravenous Q12H  ? ? have reviewed scheduled and prn medications. ? ?Physical Exam: ?General:  much improved -  moving around more-   no O2 ?Heart: RRR ?Lungs:  wheezes and congestion improved  ?Abdomen: soft,  non tender ?Extremities: mild peripheral edema  ? ? ? ?01/01/2022,8:47 AM ? LOS: 9 days  ? ?  ? ? ? ? ?

## 2022-01-01 NOTE — Plan of Care (Signed)

## 2022-01-02 ENCOUNTER — Ambulatory Visit: Payer: Medicare Other | Admitting: Internal Medicine

## 2022-01-02 DIAGNOSIS — I5023 Acute on chronic systolic (congestive) heart failure: Secondary | ICD-10-CM

## 2022-01-02 DIAGNOSIS — N289 Disorder of kidney and ureter, unspecified: Secondary | ICD-10-CM

## 2022-01-02 DIAGNOSIS — N179 Acute kidney failure, unspecified: Secondary | ICD-10-CM | POA: Diagnosis present

## 2022-01-02 DIAGNOSIS — N189 Chronic kidney disease, unspecified: Secondary | ICD-10-CM

## 2022-01-02 DIAGNOSIS — I483 Typical atrial flutter: Secondary | ICD-10-CM

## 2022-01-02 LAB — GLUCOSE, CAPILLARY
Glucose-Capillary: 126 mg/dL — ABNORMAL HIGH (ref 70–99)
Glucose-Capillary: 183 mg/dL — ABNORMAL HIGH (ref 70–99)
Glucose-Capillary: 194 mg/dL — ABNORMAL HIGH (ref 70–99)
Glucose-Capillary: 211 mg/dL — ABNORMAL HIGH (ref 70–99)

## 2022-01-02 LAB — RENAL FUNCTION PANEL
Albumin: 2.7 g/dL — ABNORMAL LOW (ref 3.5–5.0)
Anion gap: 9 (ref 5–15)
BUN: 56 mg/dL — ABNORMAL HIGH (ref 8–23)
CO2: 21 mmol/L — ABNORMAL LOW (ref 22–32)
Calcium: 8.5 mg/dL — ABNORMAL LOW (ref 8.9–10.3)
Chloride: 103 mmol/L (ref 98–111)
Creatinine, Ser: 4.81 mg/dL — ABNORMAL HIGH (ref 0.44–1.00)
GFR, Estimated: 8 mL/min — ABNORMAL LOW (ref >60)
Glucose, Bld: 124 mg/dL — ABNORMAL HIGH (ref 70–99)
Phosphorus: 3.5 mg/dL (ref 2.5–4.6)
Potassium: 3.5 mmol/L (ref 3.5–5.1)
Sodium: 133 mmol/L — ABNORMAL LOW (ref 135–145)

## 2022-01-02 LAB — SYNOVIAL CELL COUNT + DIFF, W/ CRYSTALS
Eosinophils-Synovial: 0 % (ref 0–1)
Lymphocytes-Synovial Fld: 16 % (ref 0–20)
Monocyte-Macrophage-Synovial Fluid: 70 % (ref 50–90)
Neutrophil, Synovial: 14 % (ref 0–25)
WBC, Synovial: 49 /mm3 (ref 0–200)

## 2022-01-02 MED ORDER — BUPIVACAINE HCL (PF) 0.5 % IJ SOLN
10.0000 mL | Freq: Once | INTRAMUSCULAR | Status: AC
  Administered 2022-01-02: 10 mL
  Filled 2022-01-02: qty 10

## 2022-01-02 MED ORDER — METHYLPREDNISOLONE ACETATE 40 MG/ML IJ SUSP
40.0000 mg | Freq: Once | INTRAMUSCULAR | Status: AC
  Administered 2022-01-02: 40 mg via INTRA_ARTICULAR
  Filled 2022-01-02: qty 1

## 2022-01-02 NOTE — Consult Note (Signed)
Reason for Consult:Right knee pain ?Referring Physician: Dorris Carnes ?Time called: 1029 ?Time at bedside: 1055 ? ? ?[name] is an 86 y.o. female.  ?HPI: [name] was admitted 3/24 for ACS. She developed right knee pain about 2-3d ago. She has been having problems with the knee for a while and has seen Dr. Veverly Fells about it. The pain went away for about a week but has come back. WB aggravates it. ? ?Past Medical History:  ?Diagnosis Date  ? Allergic rhinitis   ? Anterior chest wall pain   ? Anxiety   ? Asthma   ? Chronic diastolic CHF (congestive heart failure) ([place])   ? Echo 01/2020: EF 60-65, normal wall motion, mild LVH, normal RV SF, RVSP 52.6 (moderate elevation), severe LAE, moderate RAE, trivial MR, mild MS (mean gradient 5.5 mmHg), mild aortic valve sclerosis (no AS); elevated E/e' c/w elevated LVEDP  ? Cough   ? DM type 2 (diabetes mellitus, type 2) ([place])   ? GERD (gastroesophageal reflux disease)   ? History of diverticulitis of colon   ? HTN (hypertension)   ? Hyperlipidemia   ? IBS (irritable bowel syndrome)   ? Iron deficiency anemia   ? Osteoporosis, unspecified   ? Renal insufficiency 07/31/2017  ? UTI (urinary tract infection)   ? ? ?Past Surgical History:  ?Procedure Laterality Date  ? CARDIOVASCULAR STRESS TEST  02/25/04  ? CORONARY BALLOON ANGIOPLASTY N/A 12/28/2021  ? Procedure: CORONARY BALLOON ANGIOPLASTY;  Surgeon: Burnell Blanks, MD;  Location: [facility];  Service: Cardiovascular;  Laterality: N/A;  ? CORONARY STENT INTERVENTION N/A 12/28/2021  ? Procedure: CORONARY STENT INTERVENTION;  Surgeon: Burnell Blanks, MD;  Location: [facility];  Service: Cardiovascular;  Laterality: N/A;  ? ESOPHAGOGASTRODUODENOSCOPY  12/26/01  ? PACEMAKER IMPLANT N/A 08/02/2020  ? Procedure: PACEMAKER IMPLANT;  Surgeon: Deboraha Sprang, MD;  Location: [facility];  Service: Cardiovascular;  Laterality: N/A;  ? RIGHT OOPHORECTOMY  jan 2010  ? RIGHT/LEFT HEART CATH AND CORONARY  ANGIOGRAPHY N/A 12/28/2021  ? Procedure: RIGHT/LEFT HEART CATH AND CORONARY ANGIOGRAPHY;  Surgeon: Burnell Blanks, MD;  Location: [facility];  Service: Cardiovascular;  Laterality: N/A;  ? ? ?Family History  ?Problem Relation Age of Onset  ? Lung cancer Brother   ? Melanoma Brother   ? Hypertension Mother   ? Stroke Mother   ? Other Father   ?     poor circulation  ? Cancer Sister   ? Diabetes Daughter   ? Atopy Neg Hx   ? Asthma Neg Hx   ? Breast cancer Neg Hx   ? ? ?Social History:  reports that she quit smoking about 35 years ago. Her smoking use included cigarettes. She has a 1.50 pack-year smoking history. She has never used smokeless tobacco. She reports that she does not drink alcohol and does not use drugs. ? ?Allergies:  ?Allergies  ?Allergen Reactions  ? Aspirin Shortness Of Breath and Other (See Comments)  ?  Caused asthma symptoms  ? Ramipril Cough  ? ? ?Medications: I have reviewed the patient's current medications. ? ?Results for orders placed or performed during the hospital encounter of 12/23/21 (from the past 48 hour(s))  ?Glucose, capillary     Status: Abnormal  ? Collection Time: 12/31/21 11:29 AM  ?Result Value Ref Range  ? Glucose-Capillary 180 (H) 70 - 99 mg/dL  ?  Comment: Glucose reference range applies only to samples taken after fasting  for at least 8 hours.  ?Glucose, capillary     Status: Abnormal  ? Collection Time: 12/31/21  4:44 PM  ?Result Value Ref Range  ? Glucose-Capillary 142 (H) 70 - 99 mg/dL  ?  Comment: Glucose reference range applies only to samples taken after fasting for at least 8 hours.  ?Glucose, capillary     Status: Abnormal  ? Collection Time: 12/31/21  9:21 PM  ?Result Value Ref Range  ? Glucose-Capillary 172 (H) 70 - 99 mg/dL  ?  Comment: Glucose reference range applies only to samples taken after fasting for at least 8 hours.  ?Renal function panel     Status: Abnormal  ? Collection Time: 01/01/22 12:54 AM  ?Result Value Ref Range  ? Sodium 136 135 -  145 mmol/L  ? Potassium 3.6 3.5 - 5.1 mmol/L  ? Chloride 105 98 - 111 mmol/L  ? CO2 21 (L) 22 - 32 mmol/L  ? Glucose, Bld 133 (H) 70 - 99 mg/dL  ?  Comment: Glucose reference range applies only to samples taken after fasting for at least 8 hours.  ? BUN 61 (H) 8 - 23 mg/dL  ? Creatinine, Ser 4.71 (H) 0.44 - 1.00 mg/dL  ? Calcium 8.6 (L) 8.9 - 10.3 mg/dL  ? Phosphorus 3.3 2.5 - 4.6 mg/dL  ? Albumin 2.7 (L) 3.5 - 5.0 g/dL  ? GFR, Estimated 9 (L) >60 mL/min  ?  Comment: (NOTE) ?Calculated using the CKD-EPI Creatinine Equation (2021) ?  ? Anion gap 10 5 - 15  ?  Comment: Performed at [facility], [place] [address], [place], [place] [address]  ?CBC     Status: Abnormal  ? Collection Time: 01/01/22 12:54 AM  ?Result Value Ref Range  ? WBC 10.9 (H) 4.0 - 10.5 K/uL  ? RBC 3.19 (L) 3.87 - 5.11 MIL/uL  ? Hemoglobin 8.8 (L) 12.0 - 15.0 g/dL  ? HCT 25.0 (L) 36.0 - 46.0 %  ? MCV 78.4 (L) 80.0 - 100.0 fL  ? MCH 27.6 26.0 - 34.0 pg  ? MCHC 35.2 30.0 - 36.0 g/dL  ? RDW 16.5 (H) 11.5 - 15.5 %  ? Platelets 140 (L) 150 - 400 K/uL  ? nRBC 19.2 (H) 0.0 - 0.2 %  ?  Comment: Performed at [facility], [place] [address], [place], [place] [address]  ?Glucose, capillary     Status: Abnormal  ? Collection Time: 01/01/22  6:15 AM  ?Result Value Ref Range  ? Glucose-Capillary 114 (H) 70 - 99 mg/dL  ?  Comment: Glucose reference range applies only to samples taken after fasting for at least 8 hours.  ?Glucose, capillary     Status: Abnormal  ? Collection Time: 01/01/22 11:44 AM  ?Result Value Ref Range  ? Glucose-Capillary 205 (H) 70 - 99 mg/dL  ?  Comment: Glucose reference range applies only to samples taken after fasting for at least 8 hours.  ?Glucose, capillary     Status: Abnormal  ? Collection Time: 01/01/22  4:52 PM  ?Result Value Ref Range  ? Glucose-Capillary 120 (H) 70 - 99 mg/dL  ?  Comment: Glucose reference range applies only to samples taken after fasting for at least 8 hours.  ?Glucose, capillary     Status: Abnormal  ?  Collection Time: 01/01/22  8:53 PM  ?Result Value Ref Range  ? Glucose-Capillary 213 (H) 70 - 99 mg/dL  ?  Comment: Glucose reference range applies only to samples taken after  fasting for at least 8 hours.  ?Renal function panel     Status: Abnormal  ? Collection Time: 01/02/22  1:01 AM  ?Result Value Ref Range  ? Sodium 133 (L) 135 - 145 mmol/L  ? Potassium 3.5 3.5 - 5.1 mmol/L  ? Chloride 103 98 - 111 mmol/L  ? CO2 21 (L) 22 - 32 mmol/L  ? Glucose, Bld 124 (H) 70 - 99 mg/dL  ?  Comment: Glucose reference range applies only to samples taken after fasting for at least 8 hours.  ? BUN 56 (H) 8 - 23 mg/dL  ? Creatinine, Ser 4.81 (H) 0.44 - 1.00 mg/dL  ? Calcium 8.5 (L) 8.9 - 10.3 mg/dL  ? Phosphorus 3.5 2.5 - 4.6 mg/dL  ? Albumin 2.7 (L) 3.5 - 5.0 g/dL  ? GFR, Estimated 8 (L) >60 mL/min  ?  Comment: (NOTE) ?Calculated using the CKD-EPI Creatinine Equation (2021) ?  ? Anion gap 9 5 - 15  ?  Comment: Performed at [facility], [place] [address], [place], [place] [address]  ?Glucose, capillary     Status: Abnormal  ? Collection Time: 01/02/22  6:56 AM  ?Result Value Ref Range  ? Glucose-Capillary 126 (H) 70 - 99 mg/dL  ?  Comment: Glucose reference range applies only to samples taken after fasting for at least 8 hours.  ? ? ?No results found. ? ?Review of Systems  ?HENT:  Negative for ear discharge, ear pain, hearing loss and tinnitus.   ?Eyes:  Negative for photophobia and pain.  ?Respiratory:  Negative for cough and shortness of breath.   ?Cardiovascular:  Negative for chest pain.  ?Gastrointestinal:  Negative for abdominal pain, nausea and vomiting.  ?Genitourinary:  Negative for dysuria, flank pain, frequency and urgency.  ?Musculoskeletal:  Positive for arthralgias (Right knee). Negative for back pain, myalgias and neck pain.  ?Neurological:  Negative for dizziness and headaches.  ?Hematological:  Does not bruise/bleed easily.  ?Psychiatric/Behavioral:  The patient is not nervous/anxious.   ?Blood pressure  126/63, pulse 72, temperature 97.8 ?F (36.6 ?C), temperature source Oral, resp. rate (!) 21, height 5\' 7"  (1.702 m), weight 82.5 kg, SpO2 100 %. ?Physical Exam ?Constitutional:   ?   General: She is not in acute [facility]

## 2022-01-02 NOTE — Assessment & Plan Note (Signed)
-  Concern for overdiuresis and/or contrast-induced nephropathy ?-Cardiorenal is also a consideration ?-Stage 4 CKD at baseline, creatinine is still slightly more elevated than prior ?-Nephrology is managing this issue ?-Continue Aranesp, ferric gluconate for anemia ?

## 2022-01-02 NOTE — Assessment & Plan Note (Signed)
-  Continue high-dose Lipitor ?

## 2022-01-02 NOTE — Progress Notes (Signed)
Subjective:  Cr up slightly again today but feeling well ? ?Objective ?Vital signs in last 24 hours: ?Vitals:  ? 01/01/22 2255 01/02/22 0449 01/02/22 0816 01/02/22 0821  ?BP: (!) 111/55 (!) 123/59 126/63   ?Pulse: 65 63 72   ?Resp: 20 20 (!) 21   ?Temp: 98.2 ?F (36.8 ?C) 97.8 ?F (36.6 ?C) 97.8 ?F (36.6 ?C)   ?TempSrc: Oral Oral Oral   ?SpO2: 100% 99% 100% 100%  ?Weight:      ?Height:      ? ?Weight change:  ? ?Intake/Output Summary (Last 24 hours) at 01/02/2022 1139 ?Last data filed at 01/01/2022 2014 ?Gross per 24 hour  ?Intake 480 ml  ?Output 650 ml  ?Net -170 ml  ? ? ?Assessment/Plan: 86 year old F with multiple medical issues to include CKD-  crt in mid to high 1's-  admit with CP and decreased EF but many other hospital complications as well including A on CRF ?1.Renal- baseline stage 4 CKD but had been fairly stable-  now with A on CRF in the setting of decreased EF and soft BP-  u/s negative- urine pretty bland-  no protein.  feel that A on crf is hemodynamically mediated with low BP this admit.  Dr. Caryl Comes had decreased her BP meds appropriately-  crt was trying to plateau it seemed after worsening but then worsened again-   then with dye load /cath 3/29. BUN improving, Cr still up a little, UOP improved- hopefully we are seeing the plateau ?2. Hypertension/volume  - decreased Lasix to 40 IV q 12  ?3. Anemia  - hgb just under 10- then dropped req transfusion on 3/29-   iron stores low, repleting-  also added ESA ?4. ID-  thought to have PNA- on Unasyn and azithro -  needs pulmonary toilet ?5. Nausea-  improving ?6. CAD-  s/p cath with intervention on 3/29- clinically improved ? ? ?[name]  ? ? ?Labs: ?Basic Metabolic Panel: ?Recent Labs  ?Lab 12/31/21 ?7062 01/01/22 ?3762 01/02/22 ?0101  ?NA 138 136 133*  ?K 3.9 3.6 3.5  ?CL 105 105 103  ?CO2 21* 21* 21*  ?GLUCOSE 121* 133* 124*  ?BUN 65* 61* 56*  ?CREATININE 4.63* 4.71* 4.81*  ?CALCIUM 8.7* 8.6* 8.5*  ?PHOS  --  3.3 3.5  ? ?Liver Function Tests: ?Recent  Labs  ?Lab 12/29/21 ?8315 12/30/21 ?1761 12/31/21 ?6073 01/01/22 ?7106 01/02/22 ?0101  ?AST 129* 104* 77*  --   --   ?ALT 89* 105* 104*  --   --   ?ALKPHOS 101 148* 146*  --   --   ?BILITOT 1.7* 1.4* 1.1  --   --   ?PROT 6.1* 6.5 6.2*  --   --   ?ALBUMIN 2.9* 2.9* 2.8* 2.7* 2.7*  ? ?No results for input(s): LIPASE, AMYLASE in the last 168 hours. ? ?No results for input(s): AMMONIA in the last 168 hours. ?CBC: ?Recent Labs  ?Lab 12/27/21 ?2694 12/28/21 ?0109 12/28/21 ?1357 12/28/21 ?1406 12/29/21 ?8546 01/01/22 ?2703  ?WBC 11.5* 10.0  --   --  12.0* 10.9*  ?HGB 9.7* 9.3*   < > 9.5* 8.9* 8.8*  ?HCT 28.2* 26.9*   < > 28.0* 26.0* 25.0*  ?MCV 79.7* 80.1  --   --  80.0 78.4*  ?PLT 147* 151  --   --  145* 140*  ? < > = values in this interval not displayed.  ? ?Cardiac Enzymes: ?No results for input(s): CKTOTAL, CKMB, CKMBINDEX, TROPONINI in the  last 168 hours. ?CBG: ?Recent Labs  ?Lab 01/01/22 ?0615 01/01/22 ?1144 01/01/22 ?1652 01/01/22 ?2053 01/02/22 ?9381  ?GLUCAP 114* 205* 120* 213* 126*  ? ? ?Iron Studies:  ?No results for input(s): IRON, TIBC, TRANSFERRIN, FERRITIN in the last 72 hours. ? ?Studies/Results: ?No results found. ?Medications: ?Infusions: ? sodium chloride 10 mL/hr at 12/23/21 1053  ? sodium chloride    ? ? ?Scheduled Medications: ? apixaban  2.5 mg Oral BID  ? aspirin EC  81 mg Oral Daily  ? atorvastatin  80 mg Oral Daily  ? bupivacaine(PF)  10 mL Infiltration Once  ? clopidogrel  75 mg Oral Q breakfast  ? darbepoetin (ARANESP) injection - NON-DIALYSIS  150 mcg Subcutaneous Q Sat-1800  ? fluticasone furoate-vilanterol  1 puff Inhalation Daily  ? furosemide  40 mg Intravenous Q12H  ? hydrALAZINE  25 mg Oral TID  ? insulin aspart  0-5 Units Subcutaneous QHS  ? insulin aspart  0-9 Units Subcutaneous TID WC  ? latanoprost  1 drop Both Eyes QHS  ? methylPREDNISolone acetate  40 mg Intra-articular Once  ? montelukast  10 mg Oral QHS  ? ondansetron (ZOFRAN) IV  4 mg Intravenous Q6H  ? pantoprazole  40 mg Oral  BID  ? sodium chloride flush  3 mL Intravenous Q12H  ? sodium chloride flush  3 mL Intravenous Q12H  ? sodium chloride flush  3 mL Intravenous Q12H  ? ? have reviewed scheduled and prn medications. ? ?Physical Exam: ?General:  NAD, on RA ?Heart: RRR ?Lungs:  tiny LLL squeak ?Abdomen: soft,  non tender ?Extremities: mild peripheral edema  ? ? ? ?01/02/2022,11:39 AM ? LOS: 10 days  ? ?  ? ? ? ? ?

## 2022-01-02 NOTE — Plan of Care (Signed)

## 2022-01-02 NOTE — Assessment & Plan Note (Signed)
-  Continue hydralazine ?-She was on amlodipine prior to hospitalization but this has not been resumed ?

## 2022-01-02 NOTE — Assessment & Plan Note (Signed)
-  Patient presented with NSTEMI ?-s/p cath with DES to LAD ?-She will need DAPT x 1 month, continuing ASA and Plavix for now ?-Currently without symptoms but still in progressive care, maybe able to transfer to telemetry? ?-Cardiology is managing this issue ? ?

## 2022-01-02 NOTE — Progress Notes (Signed)
? ?Progress Note ? ?Patient Name: [name] ?Date of Encounter: 01/02/2022 ? ?[facility] Cardiologist: Dorris Carnes, MD  ? ?Subjective  ? ?Patient alert, denies chest pain or shortness of breath. ? ?Inpatient Medications  ?  ?Scheduled Meds: ? apixaban  2.5 mg Oral BID  ? aspirin EC  81 mg Oral Daily  ? atorvastatin  80 mg Oral Daily  ? clopidogrel  75 mg Oral Q breakfast  ? darbepoetin (ARANESP) injection - NON-DIALYSIS  150 mcg Subcutaneous Q Sat-1800  ? fluticasone furoate-vilanterol  1 puff Inhalation Daily  ? furosemide  40 mg Intravenous Q12H  ? hydrALAZINE  25 mg Oral TID  ? insulin aspart  0-5 Units Subcutaneous QHS  ? insulin aspart  0-9 Units Subcutaneous TID WC  ? latanoprost  1 drop Both Eyes QHS  ? montelukast  10 mg Oral QHS  ? ondansetron (ZOFRAN) IV  4 mg Intravenous Q6H  ? pantoprazole  40 mg Oral BID  ? sodium chloride flush  3 mL Intravenous Q12H  ? sodium chloride flush  3 mL Intravenous Q12H  ? sodium chloride flush  3 mL Intravenous Q12H  ? ?Continuous Infusions: ? sodium chloride 10 mL/hr at 12/23/21 1053  ? sodium chloride    ? ferric gluconate (FERRLECIT) IVPB 125 mg (01/01/22 0859)  ? ?PRN Meds: ?sodium chloride, acetaminophen, albuterol, albuterol, bisacodyl, metoCLOPramide (REGLAN) injection, nitroGLYCERIN, sodium chloride flush, traMADol, white petrolatum  ? ?Vital Signs  ?  ?Vitals:  ? 01/01/22 2255 01/02/22 6644 01/02/22 0347 01/02/22 4259  ?BP: (!) 111/55 (!) 123/59 126/63   ?Pulse: 65 63 72   ?Resp: 20 20 (!) 21   ?Temp: 98.2 ?F (36.8 ?C) 97.8 ?F (36.6 ?C) 97.8 ?F (36.6 ?C)   ?TempSrc: Oral Oral Oral   ?SpO2: 100% 99% 100% 100%  ?Weight:      ?Height:      ? ? ?Intake/Output Summary (Last 24 hours) at 01/02/2022 0857 ?Last data filed at 01/01/2022 2014 ?Gross per 24 hour  ?Intake 940 ml  ?Output 650 ml  ?Net 290 ml  ? ? ?  01/01/2022  ?  5:30 AM 12/31/2021  ? 12:04 AM 12/30/2021  ?  5:00 AM  ?Last 3 Weights  ?Weight (lbs) 181 lb 14.1 oz 181 lb 14.1 oz 177 lb 14.6 oz  ?Weight (kg) 82.5 kg  82.5 kg 80.7 kg  ?   ? ?Telemetry  ?  ?Paced rhythm- Personally Reviewed ? ?ECG  ?  ?Not performed today- Personally Reviewed ? ?Physical Exam  ? ?GEN: No acute distress.   ?Neck: No JVD ?Cardiac: RRR, no murmurs, rubs, or gallops.  ?Respiratory: Clear to auscultation bilaterally. ?GI: Soft, nontender, non-distended  ?MS: No edema; No deformity. ?Neuro:  Nonfocal  ?Psych: Normal affect  ? ?Labs  ?  ?High Sensitivity Troponin:   ?Recent Labs  ?Lab 12/23/21 ?0426 12/23/21 ?0617 12/27/21 ?5638 12/27/21 ?1118  ?TROPONINIHS 187* 346* 2,797* 2,716*  ?   ?Chemistry ?Recent Labs  ?Lab 12/27/21 ?7564 12/28/21 ?0109 12/28/21 ?1357 12/29/21 ?3329 12/30/21 ?5188 12/31/21 ?4166 01/01/22 ?0630 01/02/22 ?0101  ?NA 137 137   < > 136 138 138 136 133*  ?K 4.5 4.0   < > 4.3 3.8 3.9 3.6 3.5  ?CL 108 106  --  106 107 105 105 103  ?CO2 18* 18*  --  18* 19* 21* 21* 21*  ?GLUCOSE 180* 148*  --  110* 191* 121* 133* 124*  ?BUN 59* 62*  --  61* 67* 65*  61* 56*  ?CREATININE 3.21* 3.49*  --  3.90* 4.40* 4.63* 4.71* 4.81*  ?CALCIUM 9.0 8.9  --  8.8* 8.2* 8.7* 8.6* 8.5*  ?MG 2.2 2.1  --  2.2  --   --   --   --   ?PROT 6.5 6.1*  --  6.1* 6.5 6.2*  --   --   ?ALBUMIN 3.2* 3.0*  --  2.9* 2.9* 2.8* 2.7* 2.7*  ?AST 36 44*  --  129* 104* 77*  --   --   ?ALT 28 38  --  89* 105* 104*  --   --   ?ALKPHOS 85 84  --  101 148* 146*  --   --   ?BILITOT 1.7* 1.7*  --  1.7* 1.4* 1.1  --   --   ?GFRNONAA 14* 12*  --  11* 9* 9* 9* 8*  ?ANIONGAP 11 13  --  12 12 12 10 9   ? < > = values in this interval not displayed.  ?  ?Lipids No results for input(s): CHOL, TRIG, HDL, LABVLDL, LDLCALC, CHOLHDL in the last 168 hours.  ?Hematology ?Recent Labs  ?Lab 12/28/21 ?0109 12/28/21 ?1357 12/28/21 ?1406 12/29/21 ?9163 01/01/22 ?8466  ?WBC 10.0  --   --  12.0* 10.9*  ?RBC 3.36*  --   --  3.25* 3.19*  ?HGB 9.3*   < > 9.5* 8.9* 8.8*  ?HCT 26.9*   < > 28.0* 26.0* 25.0*  ?MCV 80.1  --   --  80.0 78.4*  ?MCH 27.7  --   --  27.4 27.6  ?MCHC 34.6  --   --  34.2 35.2  ?RDW 15.9*  --    --  15.7* 16.5*  ?PLT 151  --   --  145* 140*  ? < > = values in this interval not displayed.  ? ?Thyroid No results for input(s): TSH, FREET4 in the last 168 hours.  ?BNP ?Recent Labs  ?Lab 12/27/21 ?5993 12/28/21 ?0109 12/29/21 ?5701  ?BNP 1,711.2* 1,969.3* 2,207.8*  ?  ?DDimer No results for input(s): DDIMER in the last 168 hours.  ? ?Radiology  ?  ?No results found. ? ?Cardiac Studies  ? ?2D echocardiogram (12/23/2021) ? ?IMPRESSIONS  ? ? ? 1. Left ventricular ejection fraction, by estimation, is 30 to 35%. The  ?left ventricle has moderately decreased function. The left ventricle  ?demonstrates regional wall motion abnormalities (see scoring  ?diagram/findings for description). There is mild  ?concentric left ventricular hypertrophy. Indeterminate diastolic filling  ?due to E-A fusion. Elevated left ventricular end-diastolic pressure.  ? 2. Right ventricular systolic function is mildly reduced. The right  ?ventricular size is moderately enlarged. There is moderately elevated  ?pulmonary artery systolic pressure.  ? 3. Left atrial size was severely dilated.  ? 4. Right atrial size was moderately dilated.  ? 5. The mitral valve is abnormal. Trivial mitral valve regurgitation.  ?Moderate mitral stenosis. The mean mitral valve gradient is 6.5 mmHg.  ? 6. Tricuspid valve regurgitation is moderate.  ? 7. The aortic valve is tricuspid. There is mild calcification of the  ?aortic valve. There is mild thickening of the aortic valve. Aortic valve  ?regurgitation is trivial. Aortic valve sclerosis is present, with no  ?evidence of aortic valve stenosis.  ? 8. The inferior vena cava is dilated in size with >50% respiratory  ?variability, suggesting right atrial pressure of 8 mmHg.  ? ? ?Cardiac catheterization/PCI and stent (12/28/2021) ? ?Conclusion ? ?  ?  Mid  RCA lesion is 30% stenosed. ?  Dist RCA lesion is 50% stenosed. ?  1st Diag lesion is 90% stenosed. ?  Prox LAD to Mid LAD lesion is 95% stenosed. ?  A  drug-eluting stent was successfully placed using a SYNERGY XD 3.0X16. ?  Balloon angioplasty was performed using a BALLN SAPPHIRE 2.0X12. ?  Post intervention, there is a 0% residual stenosis. ?  Post intervention, there is a 30% residual stenosis. ?  ?Severe proximal LAD stenosis involving a moderate caliber diagonal branch.  ?Successful PTCA/DES x 1 proximal LAD ?Successful balloon angioplasty Diagonal 1 ?No obstructive disease in the Circumflex artery ?Moderate non-obstructive disease in the dominant RCA ?Elevated right and left heart pressures.  ?  ?Recommendations: Continue DAPT with ASA and Plavix for one month and can then stop ASA since she will also be on Eliquis. She remains volume overload. Will need continued diuresis with caution given renal insufficiency.  ?Coronary Diagrams ? ?Diagnostic ?Dominance: Right ?Intervention ? ? ? ?Patient Profile  ?   ? 86 y.o. female with a history of diabetes mellitus, hyperlipidemia, complete heart block s/p pacemaker, paroxysmal atrial fibrillation/flutter and mod CAD (CT in 2017 with calcium scoreof  51  mod dz LAD; filling defect in LAA), and chronic kidney disease stage IV admitted for chest pain.  Course complicated by persistent nausea with dry heaves>> being followed by GI. Possible  ?  ?Also with worsening renal function>> being followed by nephrology.  Echo with new LV dysfunction ? ?Assessment & Plan  ?  ?1: Non-STEMI-troponins went up to 2700.  Cardiac catheterization performed by Dr. Angelena Form revealed high-grade LAD/diagonal branch bifurcation disease.  She underwent PCI and stenting of the LAD.  She probably lost her diagonal branch.  She had no other significant CAD.  She is on dual antiplatelet therapy.  Because of her having A-fib on Eliquis prior to admission she will be on "triple therapy" for 1 month after which aspirin will be discontinued. ? ?2: Acute systolic heart failure-ejection fraction in November was normal, currently in the 30 to 35% range.   Cannot use ACE/ARB or Entresto due to worsening renal function.  Currently not on a beta-blocker either.  She does have reactive airway disease.  We will need to recheck 2D echo in 3 months. ? ?3: Atrial flutte

## 2022-01-02 NOTE — Progress Notes (Signed)
Physical Therapy Treatment ?Patient Details ?Name: [name] ?MRN: [id number] ?DOB: [birth year] ?Today's Date: 01/02/2022 ? ? ?History of Present Illness 86 y.o. female who was transferred to [facility] from [place] ED 12/23/21  for continuous onset of chest pain complained about persistent feeling of nausea and frequent dry heaves for 3 days.  Was admitted by cardiology service placed on heparin drip for NSTEMI and hospitalist team was consulted for nausea evaluation. s/p 3/29 Heart Cath and Coronary Angiography PMH: chronic systolic CHF LVEF 18-56%, PAF on Eliquis, IDDM, HTN, HLD, CKD stage IIIb, GERD, COPD Gold stage II, Heart block on PPM, ? ?  ?PT Comments  ? ? Pt supine in bed, daughter in room on entry. Both very concerned about pt's increased knee pain and ability to participate in therapy. Daughter asks if PT has seen CT scan. Assured pt and daughter that PT had reviewed results and that working on exercising muscle groups surrounding knee, key component in reduction of R knee pain. Pt reluctantly agrees to seated exercise. Also worked on sit<>stand and lateral stepping along bed with increased UE support to decrease force through R knee. Pt reports fatigue at end of session and continued soreness in knee but no increase in pain. Encouraged pt to do exercises at least 3 times a day and try to walk with Cardic Rehab later. D/c plans remain appropriate at this time. PT will continue to follow acutely.   ?Recommendations for follow up therapy are one component of a multi-disciplinary discharge planning process, led by the attending physician.  Recommendations may be updated based on patient status, additional functional criteria and insurance authorization. ? ?Follow Up Recommendations ? Home health PT ?  ?  ?Assistance Recommended at Discharge Frequent or constant Supervision/Assistance  ?Patient can return home with the following A lot of help with walking and/or transfers;A lot of help with  bathing/dressing/bathroom;Assistance with cooking/housework;Assistance with feeding;Direct supervision/assist for medications management;Direct supervision/assist for financial management;Assist for transportation;Help with stairs or ramp for entrance ?  ?Equipment Recommendations ? Rolling walker (2 wheels);BSC/3in1  ?  ?Recommendations for Other Services OT consult ? ? ?  ?Precautions / Restrictions Restrictions ?Weight Bearing Restrictions: No  ?  ? ?Mobility ? Bed Mobility ?Overal bed mobility: Needs Assistance ?Bed Mobility: Supine to Sit ?  ?  ?Supine to sit: Modified independent (Device/Increase time) ?  ?  ?General bed mobility comments: mod I for use of bed rail, improved LE movement to EoB and power up into seated ?  ? ?Transfers ?Overall transfer level: Needs assistance ?Equipment used: Rolling walker (2 wheels) ?Transfers: Sit to/from Stand ?Sit to Stand: Min guard ?  ?  ?  ?  ?  ?General transfer comment: min guard for 2x sit<>stand, ?  ? ?Ambulation/Gait ?  ?  ?  ?  ?  ?  ?  ?General Gait Details: declined due to increased R knee pain ? ? ? ? ?  ?Balance Overall balance assessment: Needs assistance ?Sitting-balance support: Feet supported, No upper extremity supported ?Sitting balance-Leahy Scale: Good ?  ?  ?Standing balance support: During functional activity, Reliant on assistive device for balance ?Standing balance-Leahy Scale: Fair ?Standing balance comment: requries UE support for dynamic activity ?  ?  ?  ?  ?  ?  ?  ?  ?  ?  ?  ?  ? ?  ?Cognition Arousal/Alertness: Awake/alert ?Behavior During Therapy: Flat affect ?Overall Cognitive Status: Within Functional Limits for tasks assessed ?  ?  ?  ?  ?  ?  ?  ?  ?  ?  ?  ?  ?  ?  ?  ?  ?  ?  ?  ? ?  ?  Exercises General Exercises - Lower Extremity ?Ankle Circles/Pumps: AROM, Both, 10 reps ?Long Arc Quad: AROM, Both, 5 reps ?Hip ABduction/ADduction: AROM, Both, 10 reps, Seated ?Hip Flexion/Marching: AROM, Both, 5 reps, Seated ? ?  ?General Comments  General comments (skin integrity, edema, etc.): VSS on RA ?  ?  ? ?Pertinent Vitals/Pain Pain Assessment ?Pain Assessment: 0-10 ?Pain Score: 8  ?Pain Location: R knee ?Pain Descriptors / Indicators: Aching, Sore, Sharp ?Pain Intervention(s): Limited activity within patient's tolerance, Monitored during session, Premedicated before session, Repositioned  ? ? ? ?PT Goals (current goals can now be found in the care plan section) Acute Rehab PT Goals ?Patient Stated Goal: go home ?PT Goal Formulation: With patient/family ?Time For Goal Achievement: 01/13/22 ?Potential to Achieve Goals: Fair ?Progress towards PT goals: Progressing toward goals ? ?  ?Frequency ? ? ? Min 3X/week ? ? ? ?  ?PT Plan Current plan remains appropriate  ? ? ?   ?AM-PAC PT "6 Clicks" Mobility   ?Outcome Measure ? Help needed turning from your back to your side while in a flat bed without using bedrails?: None ?Help needed moving from lying on your back to sitting on the side of a flat bed without using bedrails?: None ?Help needed moving to and from a bed to a chair (including a wheelchair)?: A Lot ?Help needed standing up from a chair using your arms (e.g., wheelchair or bedside chair)?: A Lot ?Help needed to walk in hospital room?: A Lot ?Help needed climbing 3-5 steps with a railing? : Total ?6 Click Score: 15 ? ?  ?End of Session   ?Activity Tolerance: Patient limited by pain ?Patient left: with call bell/phone within reach;with family/visitor present;in bed (sitting EoB) ?Nurse Communication: Mobility status ?PT Visit Diagnosis: Unsteadiness on feet (R26.81);Other abnormalities of gait and mobility (R26.89);Muscle weakness (generalized) (M62.81);Difficulty in walking, not elsewhere classified (R26.2) ?  ? ? ?Time: [phone] ?PT Time Calculation (min) (ACUTE ONLY): 24 min ? ?Charges:  $Therapeutic Exercise: 8-22 mins ?$Therapeutic Activity: 8-22 mins          ?          ? ?Anea Fodera B. Migdalia Dk PT, DPT ?Acute Rehabilitation Services ?Pager  [phone] ?Office [phone] ? ? ? ?[place] ?01/02/2022, 9:31 AM ? ?

## 2022-01-02 NOTE — Progress Notes (Signed)
CARDIAC REHAB PHASE I  ? ?PRE:  Rate/Rhythm: 61 pacing ? ?  BP: sitting 137/68 ? ?  SaO2: 98 RA ? ?MODE:  Ambulation: to sink ft  ? ?POST:  Rate/Rhythm: 66 pacing ? ?  BP: sitting 130/61  ? ?  SaO2: 97 RA ? ?Pt stood with min assist and walked with RW, min assist with gait belt. Pt very slow, sts knee pain is "ok". Dizziness and fatigue at edge of sink, had to sit  and rest. BP stable 127/62. Pt did c/o nausea. Able to stand and slowly walk back to recliner but needed heavy reminders to not sit too early. Pt "plopped" in recliner. VSS. Practiced IS, 500 ml. ?[id number]  ? ?Yves Dill CES, ACSM ?01/02/2022 ?3:14 PM ? ? ? ? ?

## 2022-01-02 NOTE — Consult Note (Signed)
Initial Consultation Note ? ? ?Patient: [name] TDV:[id number] DOB: September 09, 1936 PCP: [name], FNP ?DOA: 12/23/2021 ?DOS: the patient was seen and examined on 01/02/2022 ?Primary service: Fay Records, MD ? ?Referring physician: Gwenlyn Found - cardiology ?Reason for consult: NSTEMI, stable.  Many other medical issues, likely should be on medicine service.  Has orthopedic issues - consult requested.   ? ?Assessment/Plan: ?Assessment and Plan: ?* NSTEMI (non-ST elevated myocardial infarction) ([place]) ?-Patient presented with NSTEMI ?-s/p cath with DES to LAD ?-She will need DAPT x 1 month, continuing ASA and Plavix for now ?-Currently without symptoms but still in progressive care, maybe able to transfer to telemetry? ?-Cardiology is managing this issue ? ? ?Acute kidney injury superimposed on chronic kidney disease ([place]) ?-Concern for overdiuresis and/or contrast-induced nephropathy ?-Cardiorenal is also a consideration ?-Stage 4 CKD at baseline, creatinine is still slightly more elevated than prior ?-Nephrology is managing this issue ?-Continue Aranesp, ferric gluconate for anemia ? ?Osteoarthritis of knee ?-Ortho has consulted ?-Aspirate negative ?-Injected ?-Likely needs PT/OT to start mobilizing for dc ? ?Chronic combined systolic (congestive) and diastolic (congestive) heart failure (HCC) ?-EF 30-35% on echo while hospitalized, may improve over time ?-She remains on IV Lasix ?-Management per cardiology ? ?Diabetes ([place]) ?-A1c is 5, indicating excellent control ?-Cover with sensitive-scale SSI ? ?Atrial fibrillation ([place]) ?-Rate controlled without medications ?-Continue Eliquis ?-Management per cardiology ? ?Cough variant asthma ?-Appears to be stable ?-Continue Breo, Singulair, and prn Albuterol ? ?Essential hypertension ?-Continue hydralazine ?-She was on amlodipine prior to hospitalization but this has not been resumed ? ?Dyslipidemia ?-Continue high-dose Lipitor ? ? ? ? ? ? ?TRH will continue to follow the  patient.  Her medical issues appear to be stable other than cardiorenal issues and so remaining on the cardiology service is reasonable at this time. ? ? ?HPI: [name] is a 86 y.o. female with past medical history of chronic systolic CHF; PAF on Eliquis;DM; HTN; HLD; stage 3b CKD; COPD; and pacemaker placement who presented on 3/24 with NSTEMI and decompensated systolic CHF.  TRH consulted through 3/29 and signed off and was asked to reconsult today.   She reports that she is feeling much better overall.  She has had chronic R knee pain and had a CT prior to admission, this is her primary concern today and she reports that ortho is planning to come and drain fluid off her knee and inject it with steroids.  Otherwise, her breathing is good and she is not having chest pain.   ? ? ?Review of Systems: As mentioned in the history of present illness. All other systems reviewed and are negative. ?Past Medical History:  ?Diagnosis Date  ? Allergic rhinitis   ? Anterior chest wall pain   ? Anxiety   ? Asthma   ? Chronic diastolic CHF (congestive heart failure) ([place])   ? Echo 01/2020: EF 60-65, normal wall motion, mild LVH, normal RV SF, RVSP 52.6 (moderate elevation), severe LAE, moderate RAE, trivial MR, mild MS (mean gradient 5.5 mmHg), mild aortic valve sclerosis (no AS); elevated E/e' c/w elevated LVEDP  ? Cough   ? DM type 2 (diabetes mellitus, type 2) ([place])   ? GERD (gastroesophageal reflux disease)   ? History of diverticulitis of colon   ? HTN (hypertension)   ? Hyperlipidemia   ? IBS (irritable bowel syndrome)   ? Iron deficiency anemia   ? Osteoporosis, unspecified   ? Renal insufficiency 07/31/2017  ? UTI (urinary tract infection)   ? ?  Past Surgical History:  ?Procedure Laterality Date  ? CARDIOVASCULAR STRESS TEST  02/25/04  ? CORONARY BALLOON ANGIOPLASTY N/A 12/28/2021  ? Procedure: CORONARY BALLOON ANGIOPLASTY;  Surgeon: Burnell Blanks, MD;  Location: [place] [facility];  Service: Cardiovascular;   Laterality: N/A;  ? CORONARY STENT INTERVENTION N/A 12/28/2021  ? Procedure: CORONARY STENT INTERVENTION;  Surgeon: Burnell Blanks, MD;  Location: [place] [facility];  Service: Cardiovascular;  Laterality: N/A;  ? ESOPHAGOGASTRODUODENOSCOPY  12/26/01  ? PACEMAKER IMPLANT N/A 08/02/2020  ? Procedure: PACEMAKER IMPLANT;  Surgeon: Deboraha Sprang, MD;  Location: [place] [facility];  Service: Cardiovascular;  Laterality: N/A;  ? RIGHT OOPHORECTOMY  jan 2010  ? RIGHT/LEFT HEART CATH AND CORONARY ANGIOGRAPHY N/A 12/28/2021  ? Procedure: RIGHT/LEFT HEART CATH AND CORONARY ANGIOGRAPHY;  Surgeon: Burnell Blanks, MD;  Location: [place] [facility];  Service: Cardiovascular;  Laterality: N/A;  ? ?Social History:  reports that she quit smoking about 35 years ago. Her smoking use included cigarettes. She has a 1.50 pack-year smoking history. She has never used smokeless tobacco. She reports that she does not drink alcohol and does not use drugs. ? ?Allergies  ?Allergen Reactions  ? Aspirin Shortness Of Breath and Other (See Comments)  ?  Caused asthma symptoms  ? Ramipril Cough  ? ? ?Family History  ?Problem Relation Age of Onset  ? Lung cancer Brother   ? Melanoma Brother   ? Hypertension Mother   ? Stroke Mother   ? Other Father   ?     poor circulation  ? Cancer Sister   ? Diabetes Daughter   ? Atopy Neg Hx   ? Asthma Neg Hx   ? Breast cancer Neg Hx   ? ? ?Prior to Admission medications   ?Medication Sig Start Date End Date Taking? Authorizing Provider  ?acetaminophen (TYLENOL) 325 MG tablet Take 650 mg by mouth every 6 (six) hours as needed for pain.   Yes [provider]  ?albuterol (PROAIR HFA) 108 (90 Base) MCG/ACT inhaler Inhale 2 puffs into the lungs every 4 (four) hours as needed for wheezing. 05/05/19 02/04/22 Yes Tanda Rockers, MD  ?amLODipine (NORVASC) 5 MG tablet TAKE 1 TABLET BY MOUTH TWICE A DAY ?Patient taking differently: Take 5 mg by mouth in the morning and at bedtime. 09/27/21  Yes Fay Records, MD  ?Ascorbic Acid (VITAMIN C) 1000 MG tablet Take 1,000 mg by mouth daily.   Yes [provider]  ?budesonide-formoterol (SYMBICORT) 160-4.5 MCG/ACT inhaler TAKE 2 PUFFS FIRST THING IN AM AND THEN ANOTHER 2 PUFFS ABOUT 12 HOURS LATER. ?Patient taking differently: Inhale 2 puffs into the lungs every 12 (twelve) hours. 07/13/21  Yes Tanda Rockers, MD  ?cetirizine (ZYRTEC ALLERGY) 10 MG tablet Take 1 tablet (10 mg total) by mouth daily as needed for allergies. 05/04/20  Yes Tanda Rockers, MD  ?diclofenac Sodium (VOLTAREN) 1 % GEL Apply 4 g topically 4 (four) times daily as needed (pain). 01/27/21  Yes [provider]  ?ELIQUIS 2.5 MG TABS tablet TAKE 1 TABLET BY MOUTH TWICE A DAY ?Patient taking differently: Take 2.5 mg by mouth 2 (two) times daily. 11/21/21  Yes Deboraha Sprang, MD  ?furosemide (LASIX) 40 MG tablet TAKE 1 TABLET BY MOUTH EVERY DAY ?Patient taking differently: Take 40 mg by mouth daily as needed for fluid. 02/04/21  Yes Fay Records, MD  ?hydrALAZINE (APRESOLINE) 25 MG tablet TAKE 3 TABLETS (75 MG TOTAL) BY  MOUTH 3 (THREE) TIMES DAILY. 10/20/21  Yes Fay Records, MD  ?insulin lispro (HUMALOG) 100 UNIT/ML injection 5 UNITS WITH BREAKFAST, AND 18 UNITS WITH SUPPER ?Patient taking differently: Inject 5-20 Units into the skin See admin instructions. 5 units with breakfast and 20 units with dinner 12/12/21  Yes Renato Shin, MD  ?latanoprost (XALATAN) 0.005 % ophthalmic solution Place 1 drop into both eyes at bedtime. 03/16/21  Yes [provider]  ?montelukast (SINGULAIR) 10 MG tablet TAKE 1 TABLET BY MOUTH EVERYDAY AT BEDTIME ?Patient taking differently: Take 10 mg by mouth at bedtime. 12/22/21  Yes Tanda Rockers, MD  ?omeprazole (PRILOSEC) 40 MG capsule Take 1 capsule (40 mg total) by mouth daily. 07/05/21  Yes Tanda Rockers, MD  ?potassium chloride (KLOR-CON) 10 MEQ tablet TAKE 1 TABLET BY MOUTH EVERY DAY ?Patient taking differently: Take 10 mEq by mouth daily.  09/20/21  Yes Deboraha Sprang, MD  ?pravastatin (PRAVACHOL) 40 MG tablet Take 1 tablet (40 mg total) by mouth daily. 08/30/21  Yes [name], FNP  ?traMADol (ULTRAM) 50 MG tablet Take 50 mg by m

## 2022-01-02 NOTE — Assessment & Plan Note (Signed)
-  Rate controlled without medications ?-Continue Eliquis ?-Management per cardiology ?

## 2022-01-02 NOTE — Procedures (Signed)
Procedure: Right knee aspiration and injection ?  ?Indication: Right knee effusion(s) ?  ?Surgeon: Silvestre Gunner, PA-C ?  ?Assist: None ?  ?Anesthesia: Topical refrigerant ?  ?EBL: None ?  ?Complications: None ?  ?Findings: After risks/benefits explained patient desires to undergo procedure. Consent obtained and time out performed. The right knee was sterilely prepped and aspirated. 56ml clear yellow fluid obtained. 27ml 0.5% Marcaine and 40mg  depomedrol instilled. Pt tolerated the procedure well. ?  ?  ?  ?Lisette Abu, PA-C ?Orthopedic Surgery ?[phone] ? ?

## 2022-01-02 NOTE — Progress Notes (Signed)
OT Cancellation Note ? ?Patient Details ?Name: [name] ?MRN: [id number] ?DOB: 16-Oct-1935 ? ? ?Cancelled Treatment:    Reason Eval/Treat Not Completed: Other (comment) (Patient getting knee drained by MD. Will follow back as time allows.) ? ?Corinne Ports E. Allea Kassner, OTR/L ?Acute Rehabilitation Services ?[phone] ?[phone]  ? ?Corinne Ports David Rodriquez ?01/02/2022, 1:15 PM ?

## 2022-01-02 NOTE — Assessment & Plan Note (Signed)
-  Appears to be stable ?-Continue Breo, Singulair, and prn Albuterol ?

## 2022-01-02 NOTE — Assessment & Plan Note (Signed)
-  EF 30-35% on echo while hospitalized, may improve over time ?-She remains on IV Lasix ?-Management per cardiology ?

## 2022-01-02 NOTE — Assessment & Plan Note (Signed)
-  A1c is 5, indicating excellent control ?-Cover with sensitive-scale SSI ?

## 2022-01-02 NOTE — Assessment & Plan Note (Signed)
-  Ortho has consulted ?-Aspirate negative ?-Injected ?-Likely needs PT/OT to start mobilizing for dc ?

## 2022-01-03 LAB — GLUCOSE, CAPILLARY
Glucose-Capillary: 216 mg/dL — ABNORMAL HIGH (ref 70–99)
Glucose-Capillary: 238 mg/dL — ABNORMAL HIGH (ref 70–99)
Glucose-Capillary: 202 mg/dL — ABNORMAL HIGH (ref 70–99)
Glucose-Capillary: 264 mg/dL — ABNORMAL HIGH (ref 70–99)

## 2022-01-03 LAB — RENAL FUNCTION PANEL
Albumin: 2.8 g/dL — ABNORMAL LOW (ref 3.5–5.0)
Anion gap: 10 (ref 5–15)
BUN: 54 mg/dL — ABNORMAL HIGH (ref 8–23)
CO2: 20 mmol/L — ABNORMAL LOW (ref 22–32)
Calcium: 8.6 mg/dL — ABNORMAL LOW (ref 8.9–10.3)
Chloride: 104 mmol/L (ref 98–111)
Creatinine, Ser: 4.28 mg/dL — ABNORMAL HIGH (ref 0.44–1.00)
GFR, Estimated: 10 mL/min — ABNORMAL LOW (ref >60)
Glucose, Bld: 185 mg/dL — ABNORMAL HIGH (ref 70–99)
Phosphorus: 3.7 mg/dL (ref 2.5–4.6)
Potassium: 3.8 mmol/L (ref 3.5–5.1)
Sodium: 134 mmol/L — ABNORMAL LOW (ref 135–145)

## 2022-01-03 MED ORDER — CARVEDILOL 3.125 MG PO TABS
3.1250 mg | ORAL_TABLET | Freq: Two times a day (BID) | ORAL | Status: DC
Start: 2022-01-03 — End: 2022-01-04
  Administered 2022-01-03 – 2022-01-04 (×3): 3.125 mg via ORAL
  Filled 2022-01-03 (×3): qty 1

## 2022-01-03 NOTE — Progress Notes (Signed)
?PROGRESS NOTE ? ? ? ?[name]  HMC:[id number] DOB: 26-May-1936 DOA: 12/23/2021 ?PCP: [name], [place]  ? ? ?Brief Narrative:  ?[name] is a 86 year old female with past medical history significant for chronic systolic congestive heart failure, paroxysmal atrial fibrillation on Eliquis, type 2 diabetes mellitus, essential hypertension, hyperlipidemia, CKD stage IV, COPD, s/p PPM who initially presented to [facility] ED on 3/24 with NSTEMI and decompensated systolic congestive heart failure.  Patient underwent left heart catheterization with PCI/stent LAD.  TRH consulted 4/3 for assistance with medical management.  Patient underwent arthrocentesis with steroid injection of her right knee for chronic right knee pain on 4/4.  Patient states knee pain much improved.  Seen by cardiologist morning with plan discharge home tomorrow with home health. ? ? ?Assessment & Plan: ?  ?Principal Problem: ?  NSTEMI (non-ST elevated myocardial infarction) ([place]) ?Active Problems: ?  Dyslipidemia ?  Essential hypertension ?  Cough variant asthma ?  Atrial fibrillation ([place]) ?  Diabetes ([place]) ?  Chronic combined systolic (congestive) and diastolic (congestive) heart failure (HCC) ?  Osteoarthritis of knee ?  Acute kidney injury superimposed on chronic kidney disease ([place]) ? ?NSTEMI ?Patient presenting with chest pain, found to have an NSTEMI and underwent left heart catheterization with PCI/stent to LAD.  Continues on aspirin and Plavix.  Further per cardiology. ? ?Acute renal failure on CKD stage IV ?Etiology likely secondary to aggressive diuresis versus contrast-induced nephropathy.  Nephrology following.  Creatinine improving from 4.74 to 4.08 today.  Urine output 1.8 L past 24 hours.  Diuretic regimen per nephrology/cardiology. ? ?Chronic combined systolic/diastolic congestive heart failure ?LVEF 30-35% on TTE.  Currently on IV furosemide.  Management per cardiology.  Strict I's and O's and daily weights. ? ?Osteoarthritis  right knee ?Seen by orthopedics on 4/4, underwent arthrocentesis and steroid injection.  No organisms seen on Gram stain, culture pending. ? ?Type 2 diabetes mellitus ?Hemoglobin A1c 5.0, well controlled.  Diet controlled at baseline.  Continue SSI for coverage while inpatient. ? ?Paroxysmal atrial fibrillation ?Rate controlled without medications.  Continue Eliquis for anticoagulation.  Further management per cardiology. ? ?Cough variant asthma ?Stable, on room air.  Continue Breo, Singulair, albuterol MDI as needed. ? ?Essential hypertension ?Continue hydralazine ? ?Dyslipidemia ?Continue atorvastatin ? ? ?Appreciate the opportunity to participate in the care of this very nice patient.  Patient's chronic medical conditions are stable and cardiac issues and renal issues being managed by primary service cardiology and nephrology.  Per patient plans for discharge home tomorrow with home health per cardiology today.  Given stability of her other chronic medical issues, hospitalist service will sign off for now.  If any other questions or concerns arise please feel free to reach out to [place]. ? ? ?DVT prophylaxis: apixaban (ELIQUIS) tablet 2.5 mg Start: 12/29/21 1000 ?apixaban (ELIQUIS) tablet 2.5 mg  ?  Code Status: Full Code ?Family Communication: Daughter present at bedside ? ?Disposition Plan:  ?Level of care: Progressive ?Status is: Inpatient ?Remains inpatient appropriate because: Disposition per cardiology, primary service.  Looks to be discharging home with home health likely tomorrow ?  ? ? ?Subjective: ?Patient seen and examined at bedside, resting comfortably.  Lying in bed.  Daughter present.  No specific complaints or concerns at this time.  Reports her right knee pain much improved today following steroid injection.  Seen by cardiologist morning with likely plan discharge home with home health tomorrow.  No other questions or concerns at this time.  Patient denies  headache, no dizziness, no chest pain,  palpitations, no shortness of breath, no fever/chills/night sweats, no nausea/vomiting/diarrhea, no focal weakness, no fatigue, no paresthesias.  No acute concerns overnight per nursing staff. ? ?Objective: ?Vitals:  ? 01/02/22 2032 01/03/22 0032 01/03/22 0431 01/03/22 0801  ?BP: 122/71 125/72 118/71 129/65  ?Pulse: 68 65 69 64  ?Resp: 20 20 20 19   ?Temp: 97.9 ?F (36.6 ?C) 98 ?F (36.7 ?C) (!) 97 ?F (36.1 ?C) 97.8 ?F (36.6 ?C)  ?TempSrc: Axillary Oral Oral Oral  ?SpO2: 97% 99% 100% 99%  ?Weight:      ?Height:      ? ? ?Intake/Output Summary (Last 24 hours) at 01/03/2022 0924 ?Last data filed at 01/03/2022 0725 ?Gross per 24 hour  ?Intake 660 ml  ?Output 1650 ml  ?Net -990 ml  ? ?Filed Weights  ? 12/30/21 0500 12/31/21 0004 01/01/22 0530  ?Weight: 80.7 kg 82.5 kg 82.5 kg  ? ? ?Examination: ? ?General exam: Appears calm and comfortable, elderly in appearance ?Respiratory system: Clear to auscultation. Respiratory effort normal.,  On room air ?Cardiovascular system: S1 & S2 heard, RRR. No JVD, murmurs, rubs, gallops or clicks. No pedal edema. ?Gastrointestinal system: Abdomen is nondistended, soft and nontender. No organomegaly or masses felt. Normal bowel sounds heard. ?Central nervous system: Alert and oriented. No focal neurological deficits. ?Extremities: Symmetric 5 x 5 power. ?Skin: No rashes, lesions or ulcers ?Psychiatry: Judgement and insight appear normal. Mood & affect appropriate.  ? ? ? ?Data Reviewed: I have personally reviewed following labs and imaging studies ? ?CBC: ?Recent Labs  ?Lab 12/28/21 ?0109 12/28/21 ?1357 12/28/21 ?1400 12/28/21 ?1406 12/29/21 ?9678 01/01/22 ?9381  ?WBC 10.0  --   --   --  12.0* 10.9*  ?HGB 9.3* 8.2* 6.8* 9.5* 8.9* 8.8*  ?HCT 26.9* 24.0* 20.0* 28.0* 26.0* 25.0*  ?MCV 80.1  --   --   --  80.0 78.4*  ?PLT 151  --   --   --  145* 140*  ? ?Basic Metabolic Panel: ?Recent Labs  ?Lab 12/28/21 ?0109 12/28/21 ?1357 12/29/21 ?0175 12/30/21 ?1025 12/31/21 ?8527 01/01/22 ?7824 01/02/22 ?0101  01/03/22 ?2353  ?NA 137   < > 136 138 138 136 133* 134*  ?K 4.0   < > 4.3 3.8 3.9 3.6 3.5 3.8  ?CL 106  --  106 107 105 105 103 104  ?CO2 18*  --  18* 19* 21* 21* 21* 20*  ?GLUCOSE 148*  --  110* 191* 121* 133* 124* 185*  ?BUN 62*  --  61* 67* 65* 61* 56* 54*  ?CREATININE 3.49*  --  3.90* 4.40* 4.63* 4.71* 4.81* 4.28*  ?CALCIUM 8.9  --  8.8* 8.2* 8.7* 8.6* 8.5* 8.6*  ?MG 2.1  --  2.2  --   --   --   --   --   ?PHOS  --   --   --   --   --  3.3 3.5 3.7  ? < > = values in this interval not displayed.  ? ?GFR: ?Estimated Creatinine Clearance: 11 mL/min (A) (by C-G formula based on SCr of 4.28 mg/dL (H)). ?Liver Function Tests: ?Recent Labs  ?Lab 12/28/21 ?0109 12/29/21 ?6144 12/30/21 ?3154 12/31/21 ?0086 01/01/22 ?7619 01/02/22 ?0101 01/03/22 ?5093  ?AST 44* 129* 104* 77*  --   --   --   ?ALT 38 89* 105* 104*  --   --   --   ?ALKPHOS 84 101 148* 146*  --   --   --   ?  BILITOT 1.7* 1.7* 1.4* 1.1  --   --   --   ?PROT 6.1* 6.1* 6.5 6.2*  --   --   --   ?ALBUMIN 3.0* 2.9* 2.9* 2.8* 2.7* 2.7* 2.8*  ? ?No results for input(s): LIPASE, AMYLASE in the last 168 hours. ?No results for input(s): AMMONIA in the last 168 hours. ?Coagulation Profile: ?No results for input(s): INR, PROTIME in the last 168 hours. ?Cardiac Enzymes: ?No results for input(s): CKTOTAL, CKMB, CKMBINDEX, TROPONINI in the last 168 hours. ?BNP (last 3 results) ?No results for input(s): PROBNP in the last 8760 hours. ?HbA1C: ?No results for input(s): HGBA1C in the last 72 hours. ?CBG: ?Recent Labs  ?Lab 01/02/22 ?0656 01/02/22 ?1130 01/02/22 ?1626 01/02/22 ?2109 01/03/22 ?0630  ?GLUCAP 126* 183* 194* 211* 216*  ? ?Lipid Profile: ?No results for input(s): CHOL, HDL, LDLCALC, TRIG, CHOLHDL, LDLDIRECT in the last 72 hours. ?Thyroid Function Tests: ?No results for input(s): TSH, T4TOTAL, FREET4, T3FREE, THYROIDAB in the last 72 hours. ?Anemia Panel: ?No results for input(s): VITAMINB12, FOLATE, FERRITIN, TIBC, IRON, RETICCTPCT in the last 72 hours. ?Sepsis  Labs: ?No results for input(s): PROCALCITON, LATICACIDVEN in the last 168 hours. ? ?Recent Results (from the past 240 hour(s))  ?Body fluid culture w Gram Stain     Status: None (Preliminary result)  ? Collection

## 2022-01-03 NOTE — Progress Notes (Signed)
Occupational Therapy Treatment ?Patient Details ?Name: [name] ?MRN: [id number] ?DOB: 30-Jan-1936 ?Today's Date: 01/03/2022 ? ? ?History of present illness 86 y.o. female who was transferred to [facility] from [place] ED 12/23/21  for continuous onset of chest pain complained about persistent feeling of nausea and frequent dry heaves for 3 days.  Was admitted by cardiology service placed on heparin drip for NSTEMI and hospitalist team was consulted for nausea evaluation. s/p 3/29 Heart Cath and Coronary Angiography PMH: chronic systolic CHF LVEF 16-96%, PAF on Eliquis, IDDM, HTN, HLD, CKD stage IIIb, GERD, COPD Gold stage II, Heart block on PPM, ?  ?OT comments ? Pt making excellent progress towards OT goals today, verbally discussed energy conservation education and compensatory strategies for ADL/IADL. Safety and strategies for bathing and fall prevention. Pt then able to demonstrate UB dressing at set up level, LB dressing at min guard, transfers at min guard and walked into hallway with RW at min guard level, slow gait but consistent. Pt reports feeling much better (especially in knee) and work of breathing [facility] today. On RA she maintained SpO2 of 99% and higher, BP after ambulation WFL. OT will continue to follow acutely. Continue to recommend [place] [facility] acute to maximize safety and independence in ADL and functional transfers.   ? ?Recommendations for follow up therapy are one component of a multi-disciplinary discharge planning process, led by the attending physician.  Recommendations may be updated based on patient status, additional functional criteria and insurance authorization. ?   ?Follow Up Recommendations ? Home health OT  ?  ?Assistance Recommended at Discharge Intermittent Supervision/Assistance  ?Patient can return home with the following ? A little help with bathing/dressing/bathroom;Assistance with cooking/housework;Help with stairs or ramp for entrance;Assist for transportation ?  ?Equipment  Recommendations ? Tub/shower seat  ?  ?Recommendations for Other Services   ? ?  ?Precautions / Restrictions Restrictions ?Weight Bearing Restrictions: No  ? ? ?  ? ?Mobility Bed Mobility ?  ?  ?  ?  ?  ?  ?  ?General bed mobility comments: OOB in recliner at beginning and end of session ?  ? ?Transfers ?Overall transfer level: Needs assistance ?Equipment used: Rolling walker (2 wheels) ?Transfers: Sit to/from Stand ?Sit to Stand: Min guard ?  ?  ?  ?  ?  ?General transfer comment: min guard for safety and line management ?  ?  ?Balance Overall balance assessment: Needs assistance ?Sitting-balance support: Feet supported, No upper extremity supported ?Sitting balance-Leahy Scale: Good ?  ?  ?Standing balance support: During functional activity, Reliant on assistive device for balance ?Standing balance-Leahy Scale: Fair ?Standing balance comment: requries UE support for dynamic activity ?  ?  ?  ?  ?  ?  ?  ?  ?  ?  ?  ?   ? ?ADL either performed or assessed with clinical judgement  ? ?ADL Overall ADL's : Needs assistance/impaired ?Eating/Feeding: Modified independent ?  ?Grooming: Wash/dry hands;Wash/dry face;Min guard;Standing ?Grooming Details (indicate cue type and reason): sink level ?  ?  ?  ?  ?Upper Body Dressing : Min guard;Sitting ?Upper Body Dressing Details (indicate cue type and reason): donning extra gown like robe ?  ?  ?Toilet Transfer: Min guard;Ambulation;Rolling walker (2 wheels) ?Toilet Transfer Details (indicate cue type and reason): good hand placement ?  ?  ?  ?  ?Functional mobility during ADLs: Min guard;Rolling walker (2 wheels) ?General ADL Comments: decreased activity tolerance ?  ? ?Extremity/Trunk Assessment Upper Extremity Assessment ?  Upper Extremity Assessment: Overall WFL for tasks assessed ?  ?  ?  ?  ?  ? ?Vision   ?  ?  ?Perception   ?  ?Praxis   ?  ? ?Cognition Arousal/Alertness: Awake/alert ?Behavior During Therapy: [facility] for tasks assessed/performed ?Overall Cognitive Status:  Within Functional Limits for tasks assessed ?  ?  ?  ?  ?  ?  ?  ?  ?  ?  ?  ?  ?  ?  ?  ?  ?  ?  ?  ?   ?Exercises   ? ?  ?Shoulder Instructions   ? ? ?  ?General Comments VSS on RA  ? ? ?Pertinent Vitals/ Pain       Pain Assessment ?Pain Assessment: Faces ?Faces Pain Scale: Hurts a little bit ?Pain Location: R knee ?Pain Descriptors / Indicators: Discomfort, Sore ?Pain Intervention(s): Monitored during session, Repositioned ? ?Home Living   ?  ?  ?  ?  ?  ?  ?  ?  ?  ?  ?  ?  ?  ?  ?  ?  ?  ?  ? ?  ?Prior Functioning/Environment    ?  ?  ?  ?   ? ?Frequency ? Min 2X/week  ? ? ? ? ?  ?Progress Toward Goals ? ?OT Goals(current goals can now be found in the care plan section) ? Progress towards OT goals: Progressing toward goals ? ?Acute Rehab OT Goals ?Patient Stated Goal: get OT at home ?OT Goal Formulation: With patient/family ?Time For Goal Achievement: 01/14/22 ?Potential to Achieve Goals: Good  ?Plan Discharge plan remains appropriate;Frequency remains appropriate;Equipment recommendations need to be updated   ? ?Co-evaluation ? ? ?   ?  ?  ?  ?  ? ?  ?AM-PAC OT "6 Clicks" Daily Activity     ?Outcome Measure ? ? Help from another person eating meals?: None ?Help from another person taking care of personal grooming?: A Little ?Help from another person toileting, which includes using toliet, bedpan, or urinal?: A Little ?Help from another person bathing (including washing, rinsing, drying)?: A Little ?Help from another person to put on and taking off regular upper body clothing?: A Little ?Help from another person to put on and taking off regular lower body clothing?: A Little ?6 Click Score: 19 ? ?  ?End of Session Equipment Utilized During Treatment: Rolling walker (2 wheels) ? ?OT Visit Diagnosis: Unsteadiness on feet (R26.81) ?  ?Activity Tolerance Patient tolerated treatment well ?  ?Patient Left in chair;with call bell/phone within reach;with family/visitor present ?  ?Nurse Communication Mobility status ?   ? ?   ? ?Time: 4818-5631 ?OT Time Calculation (min): 24 min ? ?Charges: OT General Charges ?$OT Visit: 1 Visit ?OT Treatments ?$Self Care/Home Management : 8-22 mins ?$Therapeutic Activity: 8-22 mins ? ?Jesse Sans OTR/L ?Acute Rehabilitation Services ?Pager: [phone] ?Office: [phone] ? ?[name] ?01/03/2022, 1:01 PM ?

## 2022-01-03 NOTE — Progress Notes (Addendum)
Inpatient Diabetes Program Recommendations ? ?AACE/ADA: New Consensus Statement on Inpatient Glycemic Control (2015) ? ?Target Ranges:  Prepandial:   less than 140 mg/dL ?     Peak postprandial:   less than 180 mg/dL (1-2 hours) ?     Critically ill patients:  140 - 180 mg/dL  ? ?Lab Results  ?Component Value Date  ? GLUCAP 216 (H) 01/03/2022  ? HGBA1C 5.0 12/23/2021  ? ? ?Review of Glycemic Control ? Latest Reference Range & Units 01/01/22 16:52 01/01/22 20:53 01/02/22 06:56 01/02/22 11:30 01/02/22 16:26 01/02/22 21:09 01/03/22 06:30  ?Glucose-Capillary 70 - 99 mg/dL 120 (H) 213 (H) 126 (H) 183 (H) 194 (H) 211 (H) 216 (H)  ?(H): Data is abnormally high ? ?Diabetes history: DM2 ?Outpatient Diabetes medications: Humalog 5 units ac breakfast, 18 units ac supper ?Current orders for Inpatient glycemic control: Novolog correction 0-9 units tid, 0-5 hs ? ?Inpatient Diabetes Program Recommendations:   ?Patient sees Dr. Loanne Drilling for endocrinology with last office visit 12/12/21. @ this visit pm dose of Humalog was decreased from 20 to 18. ?A1c is 5 which indicates patient may need to decrease insulin doses to prevent hypoglycemia. ?Please consider @ discharge: ?Patient may not require insulin @ discharge ? ?If insulin prescribed, consider: ?-Humalog 3 units bid ac breakfast and supper. ?Or D/C Humalog and place on small amount of basal ?-Levemir 8 units q am (0.1unit/kg x 82.5 kg) ? ?Thank you, ?Nani Gasser Phylliss Strege, RN, MSN, CDE  ?Diabetes Coordinator ?Inpatient Glycemic Control Team ?Team Pager [phone] (8am-5pm) ?01/03/2022 10:12 AM ? ? ? ? ? ?

## 2022-01-03 NOTE — TOC Progression Note (Signed)
Transition of Care (TOC) - Progression Note  ? ? ?Patient Details  ?Name: CAMAY PEDIGO ?MRN: [id number] ?Date of Birth: [birth year] ? ?Transition of Care (TOC) CM/SW Contact  ?Angelita Ingles, RN ?Phone Number:[phone] ? ?01/03/2022, 3:39 PM ? ?Clinical Narrative:    ?TOC consult order has been entered. There are currently no additional TOC needs noted. Per previous documentation HH has been set up with [facility] and DME has been delivered per [employer].  ? ? ?Expected Discharge Plan: Home/Self Care ?Barriers to Discharge: Continued Medical Work up ? ?Expected Discharge Plan and Services ?Expected Discharge Plan: Home/Self Care ?In-house Referral: NA ?Discharge Planning Services: CM Consult ?Post Acute Care Choice: NA ?Living arrangements for the past 2 months: [place] ?                ?DME Arranged: N/A ?DME Agency: NA ?  ?  ?  ?HH Arranged: NA ?[place] Agency: NA ?  ?  ?  ? ? ?Social Determinants of Health (SDOH) Interventions ?  ? ?Readmission Risk Interventions ? ?  12/30/2021  ?  3:40 PM  ?Readmission Risk Prevention Plan  ?Transportation Screening Complete  ?PCP or Specialist Appt within 3-5 Days Complete  ?[place] or Home Care Consult Complete  ?Social Work Consult for [place] Planning/Counseling Complete  ?Palliative Care Screening Complete  ?Medication Review Press photographer) Referral to Pharmacy  ? ? ?

## 2022-01-03 NOTE — Progress Notes (Signed)
Subjective:  Cr now downtrending!  Had knee aspiration and injection yesterday, feeling better.  Still has some wheezing, overall improved.   ? ?Objective ?Vital signs in last 24 hours: ?Vitals:  ? 01/02/22 2032 01/03/22 0032 01/03/22 0431 01/03/22 0801  ?BP: 122/71 125/72 118/71 129/65  ?Pulse: 68 65 69 64  ?Resp: 20 20 20 19   ?Temp: 97.9 ?F (36.6 ?C) 98 ?F (36.7 ?C) (!) 97 ?F (36.1 ?C) 97.8 ?F (36.6 ?C)  ?TempSrc: Axillary Oral Oral Oral  ?SpO2: 97% 99% 100% 99%  ?Weight:      ?Height:      ? ?Weight change:  ? ?Intake/Output Summary (Last 24 hours) at 01/03/2022 1044 ?Last data filed at 01/03/2022 0725 ?Gross per 24 hour  ?Intake 660 ml  ?Output 1650 ml  ?Net -990 ml  ? ? ?Assessment/Plan: 86 year old F with multiple medical issues to include CKD-  crt in mid to high 1's-  admit with CP and decreased EF but many other hospital complications as well including A on CRF ?1.Renal- baseline stage 4 CKD but had been fairly stable-  now with A on CRF in the setting of decreased EF and soft BP-  u/s negative- urine pretty bland-  no protein.  feel that A on crf is hemodynamically mediated with low BP this admit.  Dr. Caryl Comes had decreased her BP meds appropriately-  crt was trying to plateau it seemed after worsening but then worsened again-   then with dye load /cath 3/29. ?- UOP has improved, CR downtrending ?- continue Lasix, transition to orals tomorrow 01/04/22 ?- BP appropriate ? ?2. Hypertension/volume  - decreased Lasix to 40 IV q 12, transition to orals tomorrow 01/04/22 ?3. Anemia  - hgb just under 10- then dropped req transfusion on 3/29-   iron stores low, repleting-  also added ESA ?4. ID-  thought to have PNA- on Unasyn and azithro, s/p unasyn and azithro ?5. Nausea-  improving ?6. CAD-  s/p cath with intervention on 3/29 ?7.  Acute systolic CHF- EF now 50-27% ?8.  Aflutter- Eliquis, has PPM ?9.  Dispo: if cr downtrending tomorrow, can switch to orals with close followup (she is followed by [facility]). ? ? ?Sukhdeep Wieting,  Donye Dauenhauer  ? ? ?Labs: ?Basic Metabolic Panel: ?Recent Labs  ?Lab 01/01/22 ?7412 01/02/22 ?0101 01/03/22 ?8786  ?NA 136 133* 134*  ?K 3.6 3.5 3.8  ?CL 105 103 104  ?CO2 21* 21* 20*  ?GLUCOSE 133* 124* 185*  ?BUN 61* 56* 54*  ?CREATININE 4.71* 4.81* 4.28*  ?CALCIUM 8.6* 8.5* 8.6*  ?PHOS 3.3 3.5 3.7  ? ?Liver Function Tests: ?Recent Labs  ?Lab 12/29/21 ?7672 12/30/21 ?0947 12/31/21 ?0962 01/01/22 ?8366 01/02/22 ?0101 01/03/22 ?2947  ?AST 129* 104* 77*  --   --   --   ?ALT 89* 105* 104*  --   --   --   ?ALKPHOS 101 148* 146*  --   --   --   ?BILITOT 1.7* 1.4* 1.1  --   --   --   ?PROT 6.1* 6.5 6.2*  --   --   --   ?ALBUMIN 2.9* 2.9* 2.8* 2.7* 2.7* 2.8*  ? ?No results for input(s): LIPASE, AMYLASE in the last 168 hours. ? ?No results for input(s): AMMONIA in the last 168 hours. ?CBC: ?Recent Labs  ?Lab 12/28/21 ?0109 12/28/21 ?1357 12/28/21 ?1406 12/29/21 ?6546 01/01/22 ?5035  ?WBC 10.0  --   --  12.0* 10.9*  ?HGB 9.3*   < >  9.5* 8.9* 8.8*  ?HCT 26.9*   < > 28.0* 26.0* 25.0*  ?MCV 80.1  --   --  80.0 78.4*  ?PLT 151  --   --  145* 140*  ? < > = values in this interval not displayed.  ? ?Cardiac Enzymes: ?No results for input(s): CKTOTAL, CKMB, CKMBINDEX, TROPONINI in the last 168 hours. ?CBG: ?Recent Labs  ?Lab 01/02/22 ?0656 01/02/22 ?1130 01/02/22 ?1626 01/02/22 ?2109 01/03/22 ?0630  ?GLUCAP 126* 183* 194* 211* 216*  ? ? ?Iron Studies:  ?No results for input(s): IRON, TIBC, TRANSFERRIN, FERRITIN in the last 72 hours. ? ?Studies/Results: ?No results found. ?Medications: ?Infusions: ? sodium chloride 10 mL/hr at 12/23/21 1053  ? sodium chloride    ? ? ?Scheduled Medications: ? apixaban  2.5 mg Oral BID  ? aspirin EC  81 mg Oral Daily  ? atorvastatin  80 mg Oral Daily  ? carvedilol  3.125 mg Oral BID WC  ? clopidogrel  75 mg Oral Q breakfast  ? darbepoetin (ARANESP) injection - NON-DIALYSIS  150 mcg Subcutaneous Q Sat-1800  ? fluticasone furoate-vilanterol  1 puff Inhalation Daily  ? furosemide  40 mg Intravenous Q12H  ?  hydrALAZINE  25 mg Oral TID  ? insulin aspart  0-5 Units Subcutaneous QHS  ? insulin aspart  0-9 Units Subcutaneous TID WC  ? latanoprost  1 drop Both Eyes QHS  ? montelukast  10 mg Oral QHS  ? ondansetron (ZOFRAN) IV  4 mg Intravenous Q6H  ? pantoprazole  40 mg Oral BID  ? sodium chloride flush  3 mL Intravenous Q12H  ? sodium chloride flush  3 mL Intravenous Q12H  ? sodium chloride flush  3 mL Intravenous Q12H  ? ? have reviewed scheduled and prn medications. ? ?Physical Exam: ?General:  NAD, on RA ?Heart: RRR ?Lungs:  tiny LLL squeak ?Abdomen: soft,  non tender ?Extremities: mild peripheral edema  ? ? ? ?01/03/2022,10:44 AM ? LOS: 11 days  ? ?  ? ? ? ? ?

## 2022-01-03 NOTE — Progress Notes (Signed)
? ?Progress Note ? ?Patient Name: [name] ?Date of Encounter: 01/03/2022 ? ?[facility] Cardiologist: Dorris Carnes, MD  ? ?Subjective  ? ?Patient alert, denies chest pain or shortness of breath. ? ?Inpatient Medications  ?  ?Scheduled Meds: ? apixaban  2.5 mg Oral BID  ? aspirin EC  81 mg Oral Daily  ? atorvastatin  80 mg Oral Daily  ? clopidogrel  75 mg Oral Q breakfast  ? darbepoetin (ARANESP) injection - NON-DIALYSIS  150 mcg Subcutaneous Q Sat-1800  ? fluticasone furoate-vilanterol  1 puff Inhalation Daily  ? furosemide  40 mg Intravenous Q12H  ? hydrALAZINE  25 mg Oral TID  ? insulin aspart  0-5 Units Subcutaneous QHS  ? insulin aspart  0-9 Units Subcutaneous TID WC  ? latanoprost  1 drop Both Eyes QHS  ? montelukast  10 mg Oral QHS  ? ondansetron (ZOFRAN) IV  4 mg Intravenous Q6H  ? pantoprazole  40 mg Oral BID  ? sodium chloride flush  3 mL Intravenous Q12H  ? sodium chloride flush  3 mL Intravenous Q12H  ? sodium chloride flush  3 mL Intravenous Q12H  ? ?Continuous Infusions: ? sodium chloride 10 mL/hr at 12/23/21 1053  ? sodium chloride    ? ?PRN Meds: ?sodium chloride, acetaminophen, albuterol, albuterol, bisacodyl, metoCLOPramide (REGLAN) injection, nitroGLYCERIN, sodium chloride flush, white petrolatum  ? ?Vital Signs  ?  ?Vitals:  ? 01/02/22 2032 01/03/22 0032 01/03/22 0431 01/03/22 0801  ?BP: 122/71 125/72 118/71 129/65  ?Pulse: 68 65 69 64  ?Resp: 20 20 20 19   ?Temp: 97.9 ?F (36.6 ?C) 98 ?F (36.7 ?C) (!) 97 ?F (36.1 ?C) 97.8 ?F (36.6 ?C)  ?TempSrc: Axillary Oral Oral Oral  ?SpO2: 97% 99% 100% 99%  ?Weight:      ?Height:      ? ? ?Intake/Output Summary (Last 24 hours) at 01/03/2022 0816 ?Last data filed at 01/03/2022 0725 ?Gross per 24 hour  ?Intake 897 ml  ?Output 2050 ml  ?Net -1153 ml  ? ? ?  01/01/2022  ?  5:30 AM 12/31/2021  ? 12:04 AM 12/30/2021  ?  5:00 AM  ?Last 3 Weights  ?Weight (lbs) 181 lb 14.1 oz 181 lb 14.1 oz 177 lb 14.6 oz  ?Weight (kg) 82.5 kg 82.5 kg 80.7 kg  ?   ? ?Telemetry  ?  ?Paced  rhythm- Personally Reviewed ? ?ECG  ?  ?Not performed today- Personally Reviewed ? ?Physical Exam  ? ?GEN: No acute distress.   ?Neck: No JVD ?Cardiac: RRR, no murmurs, rubs, or gallops.  ?Respiratory: Clear to auscultation bilaterally. ?GI: Soft, nontender, non-distended  ?MS: No edema; No deformity. ?Neuro:  Nonfocal  ?Psych: Normal affect  ? ?Labs  ?  ?High Sensitivity Troponin:   ?Recent Labs  ?Lab 12/23/21 ?0426 12/23/21 ?0617 12/27/21 ?9030 12/27/21 ?1118  ?TROPONINIHS 187* 346* 2,797* 2,716*  ?   ?Chemistry ?Recent Labs  ?Lab 12/28/21 ?0109 12/28/21 ?1357 12/29/21 ?0923 12/30/21 ?3007 12/31/21 ?6226 01/01/22 ?3335 01/02/22 ?0101 01/03/22 ?4562  ?NA 137   < > 136 138 138 136 133* 134*  ?K 4.0   < > 4.3 3.8 3.9 3.6 3.5 3.8  ?CL 106  --  106 107 105 105 103 104  ?CO2 18*  --  18* 19* 21* 21* 21* 20*  ?GLUCOSE 148*  --  110* 191* 121* 133* 124* 185*  ?BUN 62*  --  61* 67* 65* 61* 56* 54*  ?CREATININE 3.49*  --  3.90* 4.40*  4.63* 4.71* 4.81* 4.28*  ?CALCIUM 8.9  --  8.8* 8.2* 8.7* 8.6* 8.5* 8.6*  ?MG 2.1  --  2.2  --   --   --   --   --   ?PROT 6.1*  --  6.1* 6.5 6.2*  --   --   --   ?ALBUMIN 3.0*  --  2.9* 2.9* 2.8* 2.7* 2.7* 2.8*  ?AST 44*  --  129* 104* 77*  --   --   --   ?ALT 38  --  89* 105* 104*  --   --   --   ?ALKPHOS 84  --  101 148* 146*  --   --   --   ?BILITOT 1.7*  --  1.7* 1.4* 1.1  --   --   --   ?GFRNONAA 12*  --  11* 9* 9* 9* 8* 10*  ?ANIONGAP 13  --  12 12 12 10 9 10   ? < > = values in this interval not displayed.  ?  ?Lipids No results for input(s): CHOL, TRIG, HDL, LABVLDL, LDLCALC, CHOLHDL in the last 168 hours.  ?Hematology ?Recent Labs  ?Lab 12/28/21 ?0109 12/28/21 ?1357 12/28/21 ?1406 12/29/21 ?2637 01/01/22 ?8588  ?WBC 10.0  --   --  12.0* 10.9*  ?RBC 3.36*  --   --  3.25* 3.19*  ?HGB 9.3*   < > 9.5* 8.9* 8.8*  ?HCT 26.9*   < > 28.0* 26.0* 25.0*  ?MCV 80.1  --   --  80.0 78.4*  ?MCH 27.7  --   --  27.4 27.6  ?MCHC 34.6  --   --  34.2 35.2  ?RDW 15.9*  --   --  15.7* 16.5*  ?PLT 151  --    --  145* 140*  ? < > = values in this interval not displayed.  ? ?Thyroid No results for input(s): TSH, FREET4 in the last 168 hours.  ?BNP ?Recent Labs  ?Lab 12/28/21 ?0109 12/29/21 ?5027  ?BNP 1,969.3* 2,207.8*  ?  ?DDimer No results for input(s): DDIMER in the last 168 hours.  ? ?Radiology  ?  ?No results found. ? ?Cardiac Studies  ? ?2D echocardiogram (12/23/2021) ? ?IMPRESSIONS  ? ? ? 1. Left ventricular ejection fraction, by estimation, is 30 to 35%. The  ?left ventricle has moderately decreased function. The left ventricle  ?demonstrates regional wall motion abnormalities (see scoring  ?diagram/findings for description). There is mild  ?concentric left ventricular hypertrophy. Indeterminate diastolic filling  ?due to E-A fusion. Elevated left ventricular end-diastolic pressure.  ? 2. Right ventricular systolic function is mildly reduced. The right  ?ventricular size is moderately enlarged. There is moderately elevated  ?pulmonary artery systolic pressure.  ? 3. Left atrial size was severely dilated.  ? 4. Right atrial size was moderately dilated.  ? 5. The mitral valve is abnormal. Trivial mitral valve regurgitation.  ?Moderate mitral stenosis. The mean mitral valve gradient is 6.5 mmHg.  ? 6. Tricuspid valve regurgitation is moderate.  ? 7. The aortic valve is tricuspid. There is mild calcification of the  ?aortic valve. There is mild thickening of the aortic valve. Aortic valve  ?regurgitation is trivial. Aortic valve sclerosis is present, with no  ?evidence of aortic valve stenosis.  ? 8. The inferior vena cava is dilated in size with >50% respiratory  ?variability, suggesting right atrial pressure of 8 mmHg.  ? ? ?Cardiac catheterization/PCI and stent (12/28/2021) ? ?Conclusion ? ?  ?  Mid RCA  lesion is 30% stenosed. ?  Dist RCA lesion is 50% stenosed. ?  1st Diag lesion is 90% stenosed. ?  Prox LAD to Mid LAD lesion is 95% stenosed. ?  A drug-eluting stent was successfully placed using a SYNERGY XD  3.0X16. ?  Balloon angioplasty was performed using a BALLN SAPPHIRE 2.0X12. ?  Post intervention, there is a 0% residual stenosis. ?  Post intervention, there is a 30% residual stenosis. ?  ?Severe proximal LAD stenosis involving a moderate caliber diagonal branch.  ?Successful PTCA/DES x 1 proximal LAD ?Successful balloon angioplasty Diagonal 1 ?No obstructive disease in the Circumflex artery ?Moderate non-obstructive disease in the dominant RCA ?Elevated right and left heart pressures.  ?  ?Recommendations: Continue DAPT with ASA and Plavix for one month and can then stop ASA since she will also be on Eliquis. She remains volume overload. Will need continued diuresis with caution given renal insufficiency.  ?Coronary Diagrams ? ?Diagnostic ?Dominance: Right ?Intervention ? ? ? ?Patient Profile  ?   ? 86 y.o. female with a history of diabetes mellitus, hyperlipidemia, complete heart block s/p pacemaker, paroxysmal atrial fibrillation/flutter and mod CAD (CT in 2017 with calcium scoreof  51  mod dz LAD; filling defect in LAA), and chronic kidney disease stage IV admitted for chest pain.  Course complicated by persistent nausea with dry heaves>> being followed by GI. Possible  ?  ?Also with worsening renal function>> being followed by nephrology.  Echo with new LV dysfunction ? ?Assessment & Plan  ?  ?1: Non-STEMI-troponins went up to 2700.  Cardiac catheterization performed by Dr. Angelena Form revealed high-grade LAD/diagonal branch bifurcation disease.  She underwent PCI and stenting of the LAD.  She probably lost her diagonal branch.  She had no other significant CAD.  She is on dual antiplatelet therapy.  Because of her having A-fib on Eliquis prior to admission she will be on "triple therapy" for 1 month after which aspirin will be discontinued. ? ?2: Acute systolic heart failure-ejection fraction in November was normal, currently in the 30 to 35% range.  Cannot use ACE/ARB or Entresto due to worsening renal  function.  Currently not on a beta-blocker either.  We will start carvedilol 3.25 mg p.o. twice daily.  She does have reactive airway disease.  We will need to recheck 2D echo in 3 months. ? ?3: Atrial flutter-on [employer]

## 2022-01-03 NOTE — Progress Notes (Signed)
CARDIAC REHAB PHASE I  ? ?PRE:  Rate/Rhythm: 70 pacing ? ?  BP: sitting 124/74 ? ?  SaO2: 98 RA ? ?MODE:  Ambulation: 16 ft into hall  ? ?POST:  Rate/Rhythm: 70 pacing ? ?  BP: sitting 126/98  ? ?  SaO2: 100 RA ? ?Pt stood and walked with RW. Small, slow steps, contact guard.  Increased distance in to hall today. Fatigued with distance. Sts her left chest feels tired. Return to recliner. Reminders for daily wts and signs of fluid given. ?[id number] ? ?Yves Dill CES, ACSM ?01/03/2022 ?3:11 PM ? ? ? ? ?

## 2022-01-04 ENCOUNTER — Other Ambulatory Visit (HOSPITAL_COMMUNITY): Payer: Self-pay

## 2022-01-04 DIAGNOSIS — I5042 Chronic combined systolic (congestive) and diastolic (congestive) heart failure: Secondary | ICD-10-CM

## 2022-01-04 DIAGNOSIS — I10 Essential (primary) hypertension: Secondary | ICD-10-CM

## 2022-01-04 DIAGNOSIS — I4811 Longstanding persistent atrial fibrillation: Secondary | ICD-10-CM

## 2022-01-04 LAB — RENAL FUNCTION PANEL
Albumin: 2.8 g/dL — ABNORMAL LOW (ref 3.5–5.0)
Anion gap: 11 (ref 5–15)
BUN: 63 mg/dL — ABNORMAL HIGH (ref 8–23)
CO2: 20 mmol/L — ABNORMAL LOW (ref 22–32)
Calcium: 8.5 mg/dL — ABNORMAL LOW (ref 8.9–10.3)
Chloride: 101 mmol/L (ref 98–111)
Creatinine, Ser: 4.28 mg/dL — ABNORMAL HIGH (ref 0.44–1.00)
GFR, Estimated: 10 mL/min — ABNORMAL LOW (ref >60)
Glucose, Bld: 165 mg/dL — ABNORMAL HIGH (ref 70–99)
Phosphorus: 3.8 mg/dL (ref 2.5–4.6)
Potassium: 3.5 mmol/L (ref 3.5–5.1)
Sodium: 132 mmol/L — ABNORMAL LOW (ref 135–145)

## 2022-01-04 LAB — GLUCOSE, CAPILLARY
Glucose-Capillary: 145 mg/dL — ABNORMAL HIGH (ref 70–99)
Glucose-Capillary: 180 mg/dL — ABNORMAL HIGH (ref 70–99)

## 2022-01-04 MED ORDER — NITROGLYCERIN 0.4 MG SL SUBL
0.4000 mg | SUBLINGUAL_TABLET | SUBLINGUAL | 2 refills | Status: DC | PRN
  Filled 2022-01-04: qty 25, 7d supply, fill #0

## 2022-01-04 MED ORDER — ASPIRIN 81 MG PO TBEC
81.0000 mg | DELAYED_RELEASE_TABLET | Freq: Every day | ORAL | 0 refills | Status: DC
Start: 2022-01-05 — End: 2022-04-06
  Filled 2022-01-04: qty 30, 30d supply, fill #0

## 2022-01-04 MED ORDER — INSULIN LISPRO 100 UNIT/ML IJ SOLN: INTRAMUSCULAR | 1 refills | Status: DC

## 2022-01-04 MED ORDER — CLOPIDOGREL BISULFATE 75 MG PO TABS
75.0000 mg | ORAL_TABLET | Freq: Every day | ORAL | 2 refills | Status: DC
Start: 2022-01-05 — End: 2022-04-03
  Filled 2022-01-04: qty 90, 90d supply, fill #0

## 2022-01-04 MED ORDER — FUROSEMIDE 40 MG PO TABS: 40.0000 mg | ORAL_TABLET | Freq: Two times a day (BID) | ORAL | Status: DC

## 2022-01-04 MED ORDER — FUROSEMIDE 40 MG PO TABS
40.0000 mg | ORAL_TABLET | Freq: Two times a day (BID) | ORAL | 1 refills | Status: DC
  Filled 2022-01-04: qty 60, 30d supply, fill #0

## 2022-01-04 MED ORDER — ATORVASTATIN CALCIUM 80 MG PO TABS
80.0000 mg | ORAL_TABLET | Freq: Every day | ORAL | 1 refills | Status: DC
  Filled 2022-01-04: qty 90, 90d supply, fill #0

## 2022-01-04 MED ORDER — CARVEDILOL 3.125 MG PO TABS
3.1250 mg | ORAL_TABLET | Freq: Two times a day (BID) | ORAL | 0 refills | Status: DC
Start: 2022-01-04 — End: 2022-04-03
  Filled 2022-01-04: qty 180, 90d supply, fill #0

## 2022-01-04 MED ORDER — PANTOPRAZOLE SODIUM 40 MG PO TBEC
40.0000 mg | DELAYED_RELEASE_TABLET | Freq: Two times a day (BID) | ORAL | 0 refills | Status: DC
Start: 2022-01-04 — End: 2022-04-03
  Filled 2022-01-04: qty 180, 90d supply, fill #0

## 2022-01-04 MED ORDER — FUROSEMIDE 80 MG PO TABS
80.0000 mg | ORAL_TABLET | Freq: Two times a day (BID) | ORAL | 1 refills | Status: DC
Start: 2022-01-04 — End: 2022-01-04
  Filled 2022-01-04: qty 60, 30d supply, fill #0

## 2022-01-04 MED ORDER — FUROSEMIDE 40 MG PO TABS
80.0000 mg | ORAL_TABLET | Freq: Two times a day (BID) | ORAL | Status: DC
  Administered 2022-01-04: 80 mg via ORAL
  Filled 2022-01-04: qty 2

## 2022-01-04 NOTE — Progress Notes (Signed)
Subjective:  Cr stabilized.  Has been switched to oral diuretics.  On RA and feels well.  Eager to go home.   ? ?Objective ?Vital signs in last 24 hours: ?Vitals:  ? 01/04/22 0743 01/04/22 0746 01/04/22 1001 01/04/22 1059  ?BP: 130/73  128/70 123/66  ?Pulse: 61   69  ?Resp: 17   14  ?Temp: 97.8 ?F (36.6 ?C)   97.8 ?F (36.6 ?C)  ?TempSrc: Oral   Oral  ?SpO2: 99% 96%  97%  ?Weight:      ?Height:      ? ?Weight change:  ? ?Intake/Output Summary (Last 24 hours) at 01/04/2022 1146 ?Last data filed at 01/04/2022 0900 ?Gross per 24 hour  ?Intake 1050 ml  ?Output 2050 ml  ?Net -1000 ml  ? ? ?Assessment/Plan: 86 year old F with multiple medical issues to include CKD-  crt in mid to high 1's-  admit with CP and decreased EF but many other hospital complications as well including A on CRF ?1.Renal- baseline stage 4 CKD but had been fairly stable-  now with A on CRF in the setting of decreased EF and soft BP-  u/s negative- urine pretty bland-  no protein.  feel that A on crf is hemodynamically mediated with low BP this admit.  Dr. Caryl Comes had decreased her BP meds appropriately-  crt was trying to plateau it seemed after worsening but then worsened again-   then with dye load /cath 3/29. ?- Cr slowly downtrending, plateaued today ?- sees [facility]- will get labs Monday with appointment that week ?- discharge on oral Lasix- would further decrease to 40 PO BID, got 80 this AM so would start that dosing as OP tomorrow 01/05/22 ? ?2. Hypertension/volume  - transitioned to orals ?3. Anemia  - hgb just under 10- then dropped req transfusion on 3/29-   iron stores low, repleting-  also added ESA ?4. ID-  thought to have PNA- on Unasyn and azithro, s/p unasyn and azithro ?5. Nausea-  improving ?6. CAD-  s/p cath with intervention on 3/29 ?7.  Acute systolic CHF- EF now 63-87% ?8.  Aflutter- Eliquis, has PPM ?9.  Dispo: OK to d/c with close followup.  Discussed with dtr labs and appt ? ? ?Rory Xiang  ? ? ?Labs: ?Basic Metabolic Panel: ?Recent  Labs  ?Lab 01/02/22 ?0101 01/03/22 ?5643 01/04/22 ?0037  ?NA 133* 134* 132*  ?K 3.5 3.8 3.5  ?CL 103 104 101  ?CO2 21* 20* 20*  ?GLUCOSE 124* 185* 165*  ?BUN 56* 54* 63*  ?CREATININE 4.81* 4.28* 4.28*  ?CALCIUM 8.5* 8.6* 8.5*  ?PHOS 3.5 3.7 3.8  ? ?Liver Function Tests: ?Recent Labs  ?Lab 12/29/21 ?3295 12/30/21 ?1884 12/31/21 ?1660 01/01/22 ?6301 01/02/22 ?0101 01/03/22 ?6010 01/04/22 ?0037  ?AST 129* 104* 77*  --   --   --   --   ?ALT 89* 105* 104*  --   --   --   --   ?ALKPHOS 101 148* 146*  --   --   --   --   ?BILITOT 1.7* 1.4* 1.1  --   --   --   --   ?PROT 6.1* 6.5 6.2*  --   --   --   --   ?ALBUMIN 2.9* 2.9* 2.8*   < > 2.7* 2.8* 2.8*  ? < > = values in this interval not displayed.  ? ?No results for input(s): LIPASE, AMYLASE in the last 168 hours. ? ?No results for input(s):  AMMONIA in the last 168 hours. ?CBC: ?Recent Labs  ?Lab 12/28/21 ?1406 12/29/21 ?9977 01/01/22 ?4142  ?WBC  --  12.0* 10.9*  ?HGB 9.5* 8.9* 8.8*  ?HCT 28.0* 26.0* 25.0*  ?MCV  --  80.0 78.4*  ?PLT  --  145* 140*  ? ?Cardiac Enzymes: ?No results for input(s): CKTOTAL, CKMB, CKMBINDEX, TROPONINI in the last 168 hours. ?CBG: ?Recent Labs  ?Lab 01/03/22 ?1119 01/03/22 ?1543 01/03/22 ?2113 01/04/22 ?0622 01/04/22 ?1057  ?GLUCAP 238* 202* 264* 145* 180*  ? ? ?Iron Studies:  ?No results for input(s): IRON, TIBC, TRANSFERRIN, FERRITIN in the last 72 hours. ? ?Studies/Results: ?No results found. ?Medications: ?Infusions: ? sodium chloride 10 mL/hr at 12/23/21 1053  ? sodium chloride    ? ? ?Scheduled Medications: ? apixaban  2.5 mg Oral BID  ? aspirin EC  81 mg Oral Daily  ? atorvastatin  80 mg Oral Daily  ? carvedilol  3.125 mg Oral BID WC  ? clopidogrel  75 mg Oral Q breakfast  ? darbepoetin (ARANESP) injection - NON-DIALYSIS  150 mcg Subcutaneous Q Sat-1800  ? fluticasone furoate-vilanterol  1 puff Inhalation Daily  ? furosemide  80 mg Oral BID  ? hydrALAZINE  25 mg Oral TID  ? insulin aspart  0-5 Units Subcutaneous QHS  ? insulin aspart  0-9  Units Subcutaneous TID WC  ? latanoprost  1 drop Both Eyes QHS  ? montelukast  10 mg Oral QHS  ? ondansetron (ZOFRAN) IV  4 mg Intravenous Q6H  ? pantoprazole  40 mg Oral BID  ? sodium chloride flush  3 mL Intravenous Q12H  ? sodium chloride flush  3 mL Intravenous Q12H  ? sodium chloride flush  3 mL Intravenous Q12H  ? ? have reviewed scheduled and prn medications. ? ?Physical Exam: ?General:  NAD, on RA ?Heart: RRR ?Lungs:  clear ?Abdomen: soft,  non tender ?Extremities: mild peripheral edema  ? ? ? ?01/04/2022,11:46 AM ? LOS: 12 days  ? ?  ? ? ? ? ?

## 2022-01-04 NOTE — Plan of Care (Signed)

## 2022-01-04 NOTE — Discharge Summary (Addendum)
?Discharge Summary  ?  ?Patient ID: [name] ?MRN: [id number]; DOB: 10-30-1935 ? ?Admit date: 12/23/2021 ?Discharge date: 01/04/2022 ? ?PCP:  [name], [place] ?  ?[facility] Providers ?Cardiologist:  Dorris Carnes, MD    ? ?Discharge Diagnoses  ?  ?Principal Problem: ?  NSTEMI (non-ST elevated myocardial infarction) ([place]) ?Active Problems: ?  Dyslipidemia ?  Essential hypertension ?  Cough variant asthma ?  Atrial fibrillation ([place]) ?  Diabetes ([place]) ?  Chronic combined systolic (congestive) and diastolic (congestive) heart failure ([facility]) ?  Osteoarthritis of knee ?  Acute kidney injury superimposed on chronic kidney disease ([place]) ? ?Diagnostic Studies/Procedures  ?  ?2D echocardiogram (12/23/2021) ?  ?IMPRESSIONS  ? ? ? 1. Left ventricular ejection fraction, by estimation, is 30 to 35%. The  ?left ventricle has moderately decreased function. The left ventricle  ?demonstrates regional wall motion abnormalities (see scoring  ?diagram/findings for description). There is mild  ?concentric left ventricular hypertrophy. Indeterminate diastolic filling  ?due to E-A fusion. Elevated left ventricular end-diastolic pressure.  ? 2. Right ventricular systolic function is mildly reduced. The right  ?ventricular size is moderately enlarged. There is moderately elevated  ?pulmonary artery systolic pressure.  ? 3. Left atrial size was severely dilated.  ? 4. Right atrial size was moderately dilated.  ? 5. The mitral valve is abnormal. Trivial mitral valve regurgitation.  ?Moderate mitral stenosis. The mean mitral valve gradient is 6.5 mmHg.  ? 6. Tricuspid valve regurgitation is moderate.  ? 7. The aortic valve is tricuspid. There is mild calcification of the  ?aortic valve. There is mild thickening of the aortic valve. Aortic valve  ?regurgitation is trivial. Aortic valve sclerosis is present, with no  ?evidence of aortic valve stenosis.  ? 8. The inferior vena cava is dilated in size with >50% respiratory  ?variability,  suggesting right atrial pressure of 8 mmHg.  ?  ?  ?Cardiac catheterization/PCI and stent (12/28/2021) ?  ?Conclusion ?  ?  ?  Mid RCA lesion is 30% stenosed. ?  Dist RCA lesion is 50% stenosed. ?  1st Diag lesion is 90% stenosed. ?  Prox LAD to Mid LAD lesion is 95% stenosed. ?  A drug-eluting stent was successfully placed using a SYNERGY XD 3.0X16. ?  Balloon angioplasty was performed using a BALLN SAPPHIRE 2.0X12. ?  Post intervention, there is a 0% residual stenosis. ?  Post intervention, there is a 30% residual stenosis. ?  ?Severe proximal LAD stenosis involving a moderate caliber diagonal branch.  ?Successful PTCA/DES x 1 proximal LAD ?Successful balloon angioplasty Diagonal 1 ?No obstructive disease in the Circumflex artery ?Moderate non-obstructive disease in the dominant RCA ?Elevated right and left heart pressures.  ?  ?Recommendations: Continue DAPT with ASA and Plavix for one month and can then stop ASA since she will also be on Eliquis. She remains volume overload. Will need continued diuresis with caution given renal insufficiency.  ?Coronary Diagrams ?  ?Diagnostic ?Dominance: Right ?Intervention ?  ? ?_____________ ?  ?History of Present Illness   ?  ?[name] is a 86 y.o. female with a hx of DM, HLD, complete heart block - s/p pacer  who was seen 12/23/2021 for the evaluation of  acute coronary syndrome  at the request of Dr. Stark Jock ( med center [place])  ?  ?Hx of Afib - took her last dose of Eliquis last night.  Home dose is 2.5 BID  ?Starting having midsternal chest pain and shortness of breath  last night. She originally thought it was indigestion and tried antacids.  This did not relieve the pain.  Eventually she went to the [facility]. ?  ?She was found to have ST segment depression in the anterior lateral leads.  Troponins were elevated. ?  ?She was given nitroglycerin, IV heparin.  The pain improved but did not completely resolve.  She was [facility] to [facility].  She continues to have some chest ache. ?  ?The chest pain had been intermittent off and on since last night.  The pains typically last for several minutes and then eased up slightly. ?She has radiation up into her neck.  Is was associated with a very hot sensation.  She has had some increased shortness of breath. ?  ?She has a history of asthma. ?History of CKD.  Her baseline creatinine is 1.7-1.8. ?  ?Hospital Course  ?   ?Consultants: Nephrology, GI, IM, Orthopedics   ? ?Non-STEMI: hsTn rose to 2797.  Cardiac catheterization performed by Dr. Angelena Form revealed high-grade LAD/diagonal branch bifurcation disease. She underwent PCI and stenting of the LAD.  It was thought she may have lost diagonal branch.  She had no other significant CAD.  Placed on dual antiplatelet therapy with ASA/plavix for one month along with Eliquis, stopping ASA after one month.  See by CR ?-- continue ASA (for one month), plavix, Eliquis, statin, coreg ? ?Acute systolic heart failure: ejection fraction in November was normal, currently in the 30 to 35% range. Cannot use ACE/ARB or Entresto due to worsening renal function. ?-- started on carvedilol 3.25 mg p.o. twice daily ?-- follow up echo in 3 months ? ?Atrial flutter: on Eliquis as an outpatient s/p permanent transvenous pacemaker implantation by Dr. Caryl Comes. ?-- Eliquis 2.5mg  BID resumed post cath ? ?Acute on chronic stage IV kidney disease:  Baseline creatinine 1.8, creatinine peaked at 4.81, improved to 4.28 prior to discharge.  Likely related to hypotension and radiocontrast nephropathy. Renal US was negative ?-- seen by nephrology with recommendations for lasix 40mg  BID PO at discharge  ? ?Nausea/vomiting: seen by GI and felt symptoms were multifactorial, resolved during admission ? ?Diabetes: Home insulin dose adjusted to 3 units with breakfast and 5 units with dinner given her Hgb A1c of 5 ? ?Anemia: Hgb hovered around 9, dropped to 6.8 requiring transfusion on 3/29 ?-- Iron  stores were low and repleted ? ?HLD: LDL 58 ?-- started on atorvastatin 80mg  daily ?-- will need FLP/LFTs in 8 weeks ? ?Knee pain: seen by orthopedics with aspiration and injection of cortisone ? ?COPD/PNA: treated with Unasyn and azithro ? ?Patient was seen by Dr. Gwenlyn Found and deemed stable for discharge home. HHPT/RN/OT ordered at discharge. Medications sent to the [facility] pharmacy. Follow up arranged in the office, along with nephrology appt arranged ? ?Did the patient have an acute coronary syndrome (MI, NSTEMI, STEMI, etc) this admission?:  Yes                              ? ?AHA/ACC Clinical Performance & Quality Measures: ?Aspirin prescribed? - Yes ?ADP Receptor Inhibitor (Plavix/Clopidogrel, Brilinta/Ticagrelor or Effient/Prasugrel) prescribed (includes medically managed patients)? - Yes ?Beta Blocker prescribed? - Yes ?High Intensity Statin (Lipitor 40-80mg  or Crestor 20-40mg ) prescribed? - Yes ?EF assessed during THIS hospitalization? - Yes ?For EF <40%, was ACEI/ARB prescribed? - No - Reason:  CKD ?For EF <40%, Aldosterone Antagonist (Spironolactone or Eplerenone) prescribed? -  No - Reason:  CKD ?Cardiac Rehab Phase II ordered (including medically managed patients)? - Yes  ? ? ?The patient will be scheduled for a TOC follow up appointment in 10-14 days.  A message has been sent to the [facility] and Scheduling Pool at the office where the patient should be seen for follow up.  ?_____________ ? ?Discharge Vitals ?Blood pressure 123/66, pulse 69, temperature 97.8 ?F (36.6 ?C), temperature source Oral, resp. rate 14, height 5\' 7"  (1.702 m), weight 82 kg, SpO2 97 %.  ?Filed Weights  ? 12/31/21 0004 01/01/22 0530 01/04/22 0502  ?Weight: 82.5 kg 82.5 kg 82 kg  ? ? ?Labs & Radiologic Studies  ?  ?CBC ?No results for input(s): WBC, NEUTROABS, HGB, HCT, MCV, PLT in the last 72 hours. ?Basic Metabolic Panel ?Recent Labs  ?  01/03/22 ?7902 01/04/22 ?0037  ?NA 134* 132*  ?K 3.8 3.5  ?CL 104 101  ?CO2 20* 20*  ?GLUCOSE 185*  165*  ?BUN 54* 63*  ?CREATININE 4.28* 4.28*  ?CALCIUM 8.6* 8.5*  ?PHOS 3.7 3.8  ? ?Liver Function Tests ?Recent Labs  ?  01/03/22 ?4097 01/04/22 ?0037  ?ALBUMIN 2.8* 2.8*  ? ?No results for input(s): LI

## 2022-01-04 NOTE — Plan of Care (Signed)

## 2022-01-04 NOTE — Progress Notes (Signed)
? ?Progress Note ? ?Patient Name: [name] ?Date of Encounter: 01/04/2022 ? ?[facility] Cardiologist: Dorris Carnes, MD  ? ?Subjective  ? ?Patient alert, denies chest pain or shortness of breath.  Left knee feels better.  Ambulated with cardiac rehab yesterday. ? ?Inpatient Medications  ?  ?Scheduled Meds: ? apixaban  2.5 mg Oral BID  ? aspirin EC  81 mg Oral Daily  ? atorvastatin  80 mg Oral Daily  ? carvedilol  3.125 mg Oral BID WC  ? clopidogrel  75 mg Oral Q breakfast  ? darbepoetin (ARANESP) injection - NON-DIALYSIS  150 mcg Subcutaneous Q Sat-1800  ? fluticasone furoate-vilanterol  1 puff Inhalation Daily  ? furosemide  80 mg Oral BID  ? hydrALAZINE  25 mg Oral TID  ? insulin aspart  0-5 Units Subcutaneous QHS  ? insulin aspart  0-9 Units Subcutaneous TID WC  ? latanoprost  1 drop Both Eyes QHS  ? montelukast  10 mg Oral QHS  ? ondansetron (ZOFRAN) IV  4 mg Intravenous Q6H  ? pantoprazole  40 mg Oral BID  ? sodium chloride flush  3 mL Intravenous Q12H  ? sodium chloride flush  3 mL Intravenous Q12H  ? sodium chloride flush  3 mL Intravenous Q12H  ? ?Continuous Infusions: ? sodium chloride 10 mL/hr at 12/23/21 1053  ? sodium chloride    ? ?PRN Meds: ?sodium chloride, acetaminophen, albuterol, albuterol, bisacodyl, metoCLOPramide (REGLAN) injection, nitroGLYCERIN, sodium chloride flush, white petrolatum  ? ?Vital Signs  ?  ?Vitals:  ? 01/04/22 0400 01/04/22 0502 01/04/22 0743 01/04/22 0746  ?BP: 118/64  130/73   ?Pulse: 64  61   ?Resp: 18  17   ?Temp: 97.7 ?F (36.5 ?C)  97.8 ?F (36.6 ?C)   ?TempSrc: Oral  Oral   ?SpO2: 98%  99% 96%  ?Weight:  82 kg    ?Height:      ? ? ?Intake/Output Summary (Last 24 hours) at 01/04/2022 0944 ?Last data filed at 01/04/2022 0900 ?Gross per 24 hour  ?Intake 1050 ml  ?Output 2050 ml  ?Net -1000 ml  ? ? ?  01/04/2022  ?  5:02 AM 01/01/2022  ?  5:30 AM 12/31/2021  ? 12:04 AM  ?Last 3 Weights  ?Weight (lbs) 180 lb 12.4 oz 181 lb 14.1 oz 181 lb 14.1 oz  ?Weight (kg) 82 kg 82.5 kg 82.5 kg  ?    ? ?Telemetry  ?  ?Paced rhythm- Personally Reviewed ? ?ECG  ?  ?Not performed today- Personally Reviewed ? ?Physical Exam  ? ?GEN: No acute distress.   ?Neck: No JVD ?Cardiac: RRR, no murmurs, rubs, or gallops.  ?Respiratory: Clear to auscultation bilaterally. ?GI: Soft, nontender, non-distended  ?MS: No edema; No deformity. ?Neuro:  Nonfocal  ?Psych: Normal affect  ? ?Labs  ?  ?High Sensitivity Troponin:   ?Recent Labs  ?Lab 12/23/21 ?0426 12/23/21 ?0617 12/27/21 ?4627 12/27/21 ?1118  ?TROPONINIHS 187* 346* 2,797* 2,716*  ?   ?Chemistry ?Recent Labs  ?Lab 12/29/21 ?0350 12/30/21 ?0938 12/31/21 ?1829 01/01/22 ?9371 01/02/22 ?0101 01/03/22 ?6967 01/04/22 ?0037  ?NA 136 138 138   < > 133* 134* 132*  ?K 4.3 3.8 3.9   < > 3.5 3.8 3.5  ?CL 106 107 105   < > 103 104 101  ?CO2 18* 19* 21*   < > 21* 20* 20*  ?GLUCOSE 110* 191* 121*   < > 124* 185* 165*  ?BUN 61* 67* 65*   < > 56*  54* 63*  ?CREATININE 3.90* 4.40* 4.63*   < > 4.81* 4.28* 4.28*  ?CALCIUM 8.8* 8.2* 8.7*   < > 8.5* 8.6* 8.5*  ?MG 2.2  --   --   --   --   --   --   ?PROT 6.1* 6.5 6.2*  --   --   --   --   ?ALBUMIN 2.9* 2.9* 2.8*   < > 2.7* 2.8* 2.8*  ?AST 129* 104* 77*  --   --   --   --   ?ALT 89* 105* 104*  --   --   --   --   ?ALKPHOS 101 148* 146*  --   --   --   --   ?BILITOT 1.7* 1.4* 1.1  --   --   --   --   ?GFRNONAA 11* 9* 9*   < > 8* 10* 10*  ?ANIONGAP 12 12 12    < > 9 10 11   ? < > = values in this interval not displayed.  ?  ?Lipids No results for input(s): CHOL, TRIG, HDL, LABVLDL, LDLCALC, CHOLHDL in the last 168 hours.  ?Hematology ?Recent Labs  ?Lab 12/28/21 ?1406 12/29/21 ?1761 01/01/22 ?6073  ?WBC  --  12.0* 10.9*  ?RBC  --  3.25* 3.19*  ?HGB 9.5* 8.9* 8.8*  ?HCT 28.0* 26.0* 25.0*  ?MCV  --  80.0 78.4*  ?MCH  --  27.4 27.6  ?MCHC  --  34.2 35.2  ?RDW  --  15.7* 16.5*  ?PLT  --  145* 140*  ? ?Thyroid No results for input(s): TSH, FREET4 in the last 168 hours.  ?BNP ?Recent Labs  ?Lab 12/29/21 ?7106  ?BNP 2,207.8*  ?  ?DDimer No results for  input(s): DDIMER in the last 168 hours.  ? ?Radiology  ?  ?No results found. ? ?Cardiac Studies  ? ?2D echocardiogram (12/23/2021) ? ?IMPRESSIONS  ? ? ? 1. Left ventricular ejection fraction, by estimation, is 30 to 35%. The  ?left ventricle has moderately decreased function. The left ventricle  ?demonstrates regional wall motion abnormalities (see scoring  ?diagram/findings for description). There is mild  ?concentric left ventricular hypertrophy. Indeterminate diastolic filling  ?due to E-A fusion. Elevated left ventricular end-diastolic pressure.  ? 2. Right ventricular systolic function is mildly reduced. The right  ?ventricular size is moderately enlarged. There is moderately elevated  ?pulmonary artery systolic pressure.  ? 3. Left atrial size was severely dilated.  ? 4. Right atrial size was moderately dilated.  ? 5. The mitral valve is abnormal. Trivial mitral valve regurgitation.  ?Moderate mitral stenosis. The mean mitral valve gradient is 6.5 mmHg.  ? 6. Tricuspid valve regurgitation is moderate.  ? 7. The aortic valve is tricuspid. There is mild calcification of the  ?aortic valve. There is mild thickening of the aortic valve. Aortic valve  ?regurgitation is trivial. Aortic valve sclerosis is present, with no  ?evidence of aortic valve stenosis.  ? 8. The inferior vena cava is dilated in size with >50% respiratory  ?variability, suggesting right atrial pressure of 8 mmHg.  ? ? ?Cardiac catheterization/PCI and stent (12/28/2021) ? ?Conclusion ? ?  ?  Mid RCA lesion is 30% stenosed. ?  Dist RCA lesion is 50% stenosed. ?  1st Diag lesion is 90% stenosed. ?  Prox LAD to Mid LAD lesion is 95% stenosed. ?  A drug-eluting stent was successfully placed using a SYNERGY XD 3.0X16. ?  Balloon angioplasty was performed using a  BALLN SAPPHIRE 2.0X12. ?  Post intervention, there is a 0% residual stenosis. ?  Post intervention, there is a 30% residual stenosis. ?  ?Severe proximal LAD stenosis involving a moderate  caliber diagonal branch.  ?Successful PTCA/DES x 1 proximal LAD ?Successful balloon angioplasty Diagonal 1 ?No obstructive disease in the Circumflex artery ?Moderate non-obstructive disease in the dominant RCA ?Elevated right and left heart pressures.  ?  ?Recommendations: Continue DAPT with ASA and Plavix for one month and can then stop ASA since she will also be on Eliquis. She remains volume overload. Will need continued diuresis with caution given renal insufficiency.  ?Coronary Diagrams ? ?Diagnostic ?Dominance: Right ?Intervention ? ? ? ?Patient Profile  ?   ? 86 y.o. female with a history of diabetes mellitus, hyperlipidemia, complete heart block s/p pacemaker, paroxysmal atrial fibrillation/flutter and mod CAD (CT in 2017 with calcium scoreof  51  mod dz LAD; filling defect in LAA), and chronic kidney disease stage IV admitted for chest pain.  Course complicated by persistent nausea with dry heaves>> being followed by GI. Possible  ?  ?Also with worsening renal function>> being followed by nephrology.  Echo with new LV dysfunction ? ?Assessment & Plan  ?  ?1: Non-STEMI-troponins went up to 2700.  Cardiac catheterization performed by Dr. Angelena Form revealed high-grade LAD/diagonal branch bifurcation disease.  She underwent PCI and stenting of the LAD.  She probably lost her diagonal branch.  She had no other significant CAD.  She is on dual antiplatelet therapy.  Because of her having A-fib on Eliquis prior to admission she will be on "triple therapy" for 1 month after which aspirin will be discontinued. ? ?2: Acute systolic heart failure-ejection fraction in November was normal, currently in the 30 to 35% range.  Cannot use ACE/ARB or Entresto due to worsening renal function.  Currently not on a beta-blocker either.  We will start carvedilol 3.25 mg p.o. twice daily.  She does have reactive airway disease.  We will need to recheck 2D echo in 3 months. ? ?3: Atrial flutter-on Eliquis as an outpatient status  post permanent transvenous pacemaker implantation by Dr. Caryl Comes. ? ?4: Acute on chronic stage IV kidney disease.  Renal following.  Baseline creatinine 1.8, creatinine currently 4.8-->4.3  Probably related to hypoten

## 2022-01-04 NOTE — Progress Notes (Signed)
Physical Therapy Treatment ?Patient Details ?Name: [name] ?MRN: [id number] ?DOB: [birth year] ?Today's Date: 01/04/2022 ? ? ?History of Present Illness 86 y.o. female who was transferred to [facility] from [place] ED 12/23/21  for continuous onset of chest pain complained about persistent feeling of nausea and frequent dry heaves for 3 days.  Was admitted by cardiology service placed on heparin drip for NSTEMI and hospitalist team was consulted for nausea evaluation. s/p 3/29 Heart Cath and Coronary Angiography PMH: chronic systolic CHF LVEF 58-52%, PAF on Eliquis, IDDM, HTN, HLD, CKD stage IIIb, GERD, COPD Gold stage II, Heart block on PPM, ? ?  ?PT Comments  ? ? Pt progressing incrementally towards their physical therapy goals. Performing warm up exercise and ambulating 20 ft with a walker at a min guard assist level. Negotiated 1 step to simulate home entrance. Provided HEP for strengthening and reviewed walking program. D/c plan remains appropriate.  ?   ?Recommendations for follow up therapy are one component of a multi-disciplinary discharge planning process, led by the attending physician.  Recommendations may be updated based on patient status, additional functional criteria and insurance authorization. ? ?Follow Up Recommendations ? Home health PT ?  ?  ?Assistance Recommended at Discharge Frequent or constant Supervision/Assistance  ?Patient can return home with the following Assistance with cooking/housework;Assistance with feeding;Direct supervision/assist for medications management;Direct supervision/assist for financial management;Assist for transportation;Help with stairs or ramp for entrance;A little help with walking and/or transfers;A little help with bathing/dressing/bathroom ?  ?Equipment Recommendations ? Rolling walker (2 wheels);BSC/3in1  ?  ?Recommendations for Other Services   ? ? ?  ?Precautions / Restrictions Precautions ?Precautions: Fall ?Restrictions ?Weight Bearing Restrictions: No  ?   ? ?Mobility ? Bed Mobility ?Overal bed mobility: Modified Independent ?  ?  ?  ?  ?  ?  ?General bed mobility comments: No physical assist ?  ? ?Transfers ?Overall transfer level: Needs assistance ?Equipment used: Rolling walker (2 wheels) ?Transfers: Sit to/from Stand ?Sit to Stand: Min guard ?  ?  ?  ?  ?  ?  ?  ? ?Ambulation/Gait ?Ambulation/Gait assistance: Min guard ?Gait Distance (Feet): 20 Feet ?Assistive device: Rolling walker (2 wheels) ?Gait Pattern/deviations: Step-through pattern, Trunk flexed, Shuffle, Decreased stride length ?Gait velocity: decreased ?Gait velocity interpretation: <1.31 ft/sec, indicative of household ambulator ?  ?General Gait Details: Min guard for balance, fatigues easily with increased trunk/hip flexion with distance. ? ? ?Stairs ?Stairs: Yes ?Stairs assistance: Min assist ?Stair Management: One rail Right ?Number of Stairs: 1 ?General stair comments: Cues for sequencing/technique. Use of R rail and L HHA ? ? ?Wheelchair Mobility ?  ? ?Modified Rankin (Stroke Patients Only) ?  ? ? ?  ?Balance Overall balance assessment: Needs assistance ?Sitting-balance support: Feet supported, No upper extremity supported ?Sitting balance-Leahy Scale: Good ?  ?  ?Standing balance support: During functional activity, Reliant on assistive device for balance, Bilateral upper extremity supported ?Standing balance-Leahy Scale: Poor ?  ?  ?  ?  ?  ?  ?  ?  ?  ?  ?  ?  ?  ? ?  ?Cognition Arousal/Alertness: Awake/alert ?Behavior During Therapy: [facility] for tasks assessed/performed ?Overall Cognitive Status: Within Functional Limits for tasks assessed ?  ?  ?  ?  ?  ?  ?  ?  ?  ?  ?  ?  ?  ?  ?  ?  ?  ?  ?  ? ?  ?Exercises General Exercises - Lower Extremity ?  Long Arc Quad: Both, 10 reps, Seated ?Hip Flexion/Marching: Both, 5 reps, Seated ?Heel Raises: Both, 10 reps, Seated ? ?  ?General Comments   ?  ?  ? ?Pertinent Vitals/Pain Pain Assessment ?Pain Assessment: Faces ?Faces Pain Scale: Hurts a little  bit ?Pain Location: R knee ?Pain Descriptors / Indicators: Discomfort, Sore ?Pain Intervention(s): Monitored during session  ? ? ?Home Living   ?  ?  ?  ?  ?  ?  ?  ?  ?  ?   ?  ?Prior Function    ?  ?  ?   ? ?PT Goals (current goals can now be found in the care plan section) Acute Rehab PT Goals ?Patient Stated Goal: go home ?Potential to Achieve Goals: Fair ?Progress towards PT goals: Progressing toward goals ? ?  ?Frequency ? ? ? Min 3X/week ? ? ? ?  ?PT Plan Current plan remains appropriate  ? ? ?Co-evaluation   ?  ?  ?  ?  ? ?  ?AM-PAC PT "6 Clicks" Mobility   ?Outcome Measure ? Help needed turning from your back to your side while in a flat bed without using bedrails?: None ?Help needed moving from lying on your back to sitting on the side of a flat bed without using bedrails?: None ?Help needed moving to and from a bed to a chair (including a wheelchair)?: A Little ?Help needed standing up from a chair using your arms (e.g., wheelchair or bedside chair)?: A Little ?Help needed to walk in hospital room?: A Little ?Help needed climbing 3-5 steps with a railing? : A Lot ?6 Click Score: 19 ? ?  ?End of Session Equipment Utilized During Treatment: Gait belt ?Activity Tolerance: Patient tolerated treatment well ?Patient left: in bed;with call bell/phone within reach;with family/visitor present ?Nurse Communication: Mobility status ?PT Visit Diagnosis: Unsteadiness on feet (R26.81);Other abnormalities of gait and mobility (R26.89);Muscle weakness (generalized) (M62.81);Difficulty in walking, not elsewhere classified (R26.2) ?  ? ? ?Time: 3220-2542 ?PT Time Calculation (min) (ACUTE ONLY): 23 min ? ?Charges:  $Gait Training: 8-22 mins ?$Therapeutic Activity: 8-22 mins          ?          ? ?[name], PT, DPT ?Acute Rehabilitation Services ?Pager [phone] ?Office [phone] ? ? ? ?[name] ?01/04/2022, 12:54 PM ? ?

## 2022-01-05 ENCOUNTER — Other Ambulatory Visit: Payer: Self-pay

## 2022-01-05 LAB — BODY FLUID CULTURE W GRAM STAIN
Culture: NO GROWTH
Gram Stain: NONE SEEN

## 2022-01-05 NOTE — Progress Notes (Signed)
? ?Cardiology Office Note   ? ?Date:  01/17/2022  ? ?ID:  [name], DOB 1936-09-07, MRN [id number] ? ? ?PCP:  [name], FNP ?  ?[address]  ?Cardiologist:  Dorris Carnes, MD   ?Advanced Practice Provider:  No care team member to display ?Electrophysiologist:  None  ? ?[id number]}  ? ?No chief complaint on file. ? ? ?History of Present Illness:  ?[name] is a 86 y.o. female  with a history of diabetes mellitus, hyperlipidemia, complete heart block s/p pacemaker, paroxysmal atrial fibrillation/flutter and mod CAD (CT in 2017 with calcium scoreof  51  mod dz LAD; filling defect in LAA), and chronic kidney disease stage IV. ? ?Patient admitted 3/29 with NSTEMI treated with DES LAD,probably lost her diagonal. ICM EF 32-99%, course complicated by AKI, persistent nausea, pneumonia, knee pain with aspiration and injection with cortisone. Crt 4.28 at discharge-followed by renal. ? ?Patient comes in with her daughter. Denies chest pain, dyspnea, dizziness or presyncope. Coughing up a lot of yellow sputum. ? ? ? ?Past Medical History:  ?Diagnosis Date  ? Allergic rhinitis   ? Anterior chest wall pain   ? Anxiety   ? Asthma   ? Chronic diastolic CHF (congestive heart failure) ([place])   ? Echo 01/2020: EF 60-65, normal wall motion, mild LVH, normal RV SF, RVSP 52.6 (moderate elevation), severe LAE, moderate RAE, trivial MR, mild MS (mean gradient 5.5 mmHg), mild aortic valve sclerosis (no AS); elevated E/e' c/w elevated LVEDP  ? Cough   ? DM type 2 (diabetes mellitus, type 2) ([place])   ? GERD (gastroesophageal reflux disease)   ? History of diverticulitis of colon   ? HTN (hypertension)   ? Hyperlipidemia   ? IBS (irritable bowel syndrome)   ? Iron deficiency anemia   ? Osteoporosis, unspecified   ? Renal insufficiency 07/31/2017  ? UTI (urinary tract infection)   ? ? ?Past Surgical History:  ?Procedure Laterality Date  ? CARDIOVASCULAR STRESS TEST  02/25/04  ? CORONARY BALLOON ANGIOPLASTY N/A  12/28/2021  ? Procedure: CORONARY BALLOON ANGIOPLASTY;  Surgeon: Burnell Blanks, MD;  Location: [facility];  Service: Cardiovascular;  Laterality: N/A;  ? CORONARY STENT INTERVENTION N/A 12/28/2021  ? Procedure: CORONARY STENT INTERVENTION;  Surgeon: Burnell Blanks, MD;  Location: [facility];  Service: Cardiovascular;  Laterality: N/A;  ? ESOPHAGOGASTRODUODENOSCOPY  12/26/01  ? PACEMAKER IMPLANT N/A 08/02/2020  ? Procedure: PACEMAKER IMPLANT;  Surgeon: Deboraha Sprang, MD;  Location: [facility];  Service: Cardiovascular;  Laterality: N/A;  ? RIGHT OOPHORECTOMY  jan 2010  ? RIGHT/LEFT HEART CATH AND CORONARY ANGIOGRAPHY N/A 12/28/2021  ? Procedure: RIGHT/LEFT HEART CATH AND CORONARY ANGIOGRAPHY;  Surgeon: Burnell Blanks, MD;  Location: [facility];  Service: Cardiovascular;  Laterality: N/A;  ? ? ?Current Medications: ?Current Meds  ?Medication Sig  ? acetaminophen (TYLENOL) 325 MG tablet Take 650 mg by mouth every 6 (six) hours as needed for pain.  ? albuterol (PROAIR HFA) 108 (90 Base) MCG/ACT inhaler Inhale 2 puffs into the lungs every 4 (four) hours as needed for wheezing.  ? Ascorbic Acid (VITAMIN C) 1000 MG tablet Take 1,000 mg by mouth daily.  ? aspirin 81 MG EC tablet Take 1 tablet (81 mg total) by mouth daily. Swallow whole.  ? atorvastatin (LIPITOR) 80 MG tablet Take 1 tablet (80 mg total) by mouth daily.  ? azithromycin (ZITHROMAX) 250 MG tablet Take 2  tablets on day 1 then take 1 tablet daily for 4 days.  ? Blood Glucose Monitoring Suppl (ONETOUCH VERIO FLEX SYSTEM) w/Device KIT USE AS DIRECTED  ? budesonide-formoterol (SYMBICORT) 160-4.5 MCG/ACT inhaler TAKE 2 PUFFS FIRST THING IN AM AND THEN ANOTHER 2 PUFFS ABOUT 12 HOURS LATER.  ? carvedilol (COREG) 3.125 MG tablet Take 1 tablet (3.125 mg total) by mouth 2 (two) times daily with a meal.  ? cetirizine (ZYRTEC ALLERGY) 10 MG tablet Take 1 tablet (10 mg total) by mouth daily as needed for allergies.  ?  clopidogrel (PLAVIX) 75 MG tablet Take 1 tablet (75 mg total) by mouth daily with breakfast.  ? diclofenac Sodium (VOLTAREN) 1 % GEL Apply 4 g topically 4 (four) times daily as needed (pain).  ? ELIQUIS 2.5 MG TABS tablet TAKE 1 TABLET BY MOUTH TWICE A DAY  ? furosemide (LASIX) 40 MG tablet Take 1 tablet (40 mg total) by mouth 2 (two) times daily.  ? glucose blood (ONETOUCH VERIO) test strip 1 each by Other route 2 (two) times daily. And lancets 2/day  ? glucose blood test strip Check blood sugar twice a day  ? hydrALAZINE (APRESOLINE) 25 MG tablet TAKE 3 TABLETS (75 MG TOTAL) BY MOUTH 3 (THREE) TIMES DAILY.  ? insulin lispro (HUMALOG) 100 UNIT/ML injection Give 3 units with BREAKFAST, AND 5 units with SUPPER  ? latanoprost (XALATAN) 0.005 % ophthalmic solution Place 1 drop into both eyes at bedtime.  ? montelukast (SINGULAIR) 10 MG tablet TAKE 1 TABLET BY MOUTH EVERYDAY AT BEDTIME  ? nitroGLYCERIN (NITROSTAT) 0.4 MG SL tablet Place 1 tablet (0.4 mg total) under the tongue every 5 (five) minutes x 3 doses as needed for chest pain.  ? pantoprazole (PROTONIX) 40 MG tablet Take 1 tablet (40 mg total) by mouth 2 (two) times daily.  ? potassium chloride (KLOR-CON) 10 MEQ tablet TAKE 1 TABLET BY MOUTH EVERY DAY  ? Syringe/Needle, Disp, (SYRINGE 3CC/25GX1") 25G X 1" 3 ML MISC Use to inject into the skin 2x a day  ? traMADol (ULTRAM) 50 MG tablet Take 50 mg by mouth every 6 (six) hours as needed for moderate pain.  ?  ? ?Allergies:   Aspirin and Ramipril  ? ?Social History  ? ?Socioeconomic History  ? Marital status: Widowed  ?  Spouse name: Not on file  ? Number of children: Not on file  ? Years of education: Not on file  ? Highest education level: Not on file  ?Occupational History  ? Occupation: retired  ?Tobacco Use  ? Smoking status: Former  ?  Packs/day: 0.30  ?  Years: 5.00  ?  Pack years: 1.50  ?  Types: Cigarettes  ?  Quit date: 10/02/1986  ?  Years since quitting: 35.3  ? Smokeless tobacco: Never  ?Vaping Use  ?  Vaping Use: Never used  ?Substance and Sexual Activity  ? Alcohol use: No  ? Drug use: No  ? Sexual activity: Not on file  ?Other Topics Concern  ? Not on file  ?Social History Narrative  ? Not on file  ? ?Social Determinants of Health  ? ?Financial Resource Strain: Not on file  ?Food Insecurity: Not on file  ?Transportation Needs: Not on file  ?Physical Activity: Not on file  ?Stress: Not on file  ?Social Connections: Not on file  ?  ? ?Family History:  The patient's  family history includes Cancer in her sister; Diabetes in her daughter; Hypertension in her mother; Lung cancer in  her brother; Melanoma in her brother; Other in her father; Stroke in her mother.  ? ?ROS:   ?Please see the history of present illness.    ?ROS All other systems reviewed and are negative. ? ? ?PHYSICAL EXAM:   ?VS:  BP 112/70 (BP Location: Left Arm, Patient Position: Sitting, Cuff Size: Normal)   Pulse 76   Ht 5' 7" (1.702 m)   Wt 170 lb (77.1 kg)   SpO2 96%   BMI 26.63 kg/m?   ?Physical Exam  ?GEN: Well nourished, well developed, in no acute distress  ?Neck: no JVD, carotid bruits, or masses ?Cardiac:RRR; no murmurs, rubs, or gallops  ?Respiratory:  decreased breath sounds with diffuse wheezing upper lungs, coughing up a lot of yellow sputum ?GI: soft, nontender, nondistended, + BS ?Ext: without cyanosis, clubbing, or edema, Good distal pulses bilaterally ?Neuro:  Alert and Oriented x 3 ?Psych: euthymic mood, full affect ? ?Wt Readings from Last 3 Encounters:  ?01/17/22 170 lb (77.1 kg)  ?01/04/22 180 lb 12.4 oz (82 kg)  ?12/12/21 181 lb 12.8 oz (82.5 kg)  ?  ? ? ?Studies/Labs Reviewed:  ? ?EKG:  EKG is not ordered today.   ? ?Recent Labs: ?12/23/2021: TSH 3.434 ?12/29/2021: B Natriuretic Peptide 2,207.8; Magnesium 2.2 ?12/31/2021: ALT 104 ?01/01/2022: Hemoglobin 8.8; Platelets 140 ?01/04/2022: BUN 63; Creatinine, Ser 4.28; Potassium 3.5; Sodium 132  ? ?Lipid Panel ?   ?Component Value Date/Time  ? CHOL 120 12/23/2021 1030  ? CHOL 135  07/28/2021 0852  ? TRIG 46 12/23/2021 1030  ? TRIG 83 08/07/2006 1419  ? HDL 55 12/23/2021 1030  ? HDL 64 07/28/2021 0852  ? CHOLHDL 2.2 12/23/2021 1030  ? VLDL 9 12/23/2021 1030  ? LDLCALC 56 12/23/2021 1030  ? LD

## 2022-01-05 NOTE — Patient Outreach (Signed)
[facility]: [facility] Rhonda W) Care Management ? ?01/05/2022 ? ?[name] ?[birth year] ?[id number] ? ? ?[place] Organization [ACO] Patient: [employer] ? ?Primary Care Provider:  [name], FNP, [place] [facility], is an embedded provider with a Chronic Care Management team and program, and is listed for the transition of care follow up and appointments. ? ?Patient was screened for post hospital transition on 01/04/22 and for Embedded practice service needs for chronic care management.   ?10: 22 am call attempts for follow up needs attempted x3 to primary phone number and unable to leave a voicemail message. ? ?Plan: Due to high risk for readmission score and long length of stay this writer will make a referral request to the [place] Management team for a [facility] CCM needs for post hospital follow up ? ?Please contact for further questions, ? ?Natividad Brood, RN BSN CCM ?[facility] Liaison ? [phone] business mobile phone ?Toll free office [phone]  ?Fax number: [phone] ?[email or web address] ? ? ? ? ?

## 2022-01-09 ENCOUNTER — Telehealth: Payer: Self-pay | Admitting: *Deleted

## 2022-01-09 NOTE — Chronic Care Management (AMB) (Signed)
?  Care Management  ? ?Note ? ?01/09/2022 ?Name: RUBBY BARBARY MRN: [id number] DOB: 27-Jul-1936 ? ?MAYGAN KOELLER is a 86 y.o. year old female who is a primary care patient of [name], [place]. I reached out to [name] by phone today offer care coordination services.  ? ?Ms. Napierkowski was given information about care management services today including:  ?Care management services include personalized support from designated clinical staff supervised by her physician, including individualized plan of care and coordination with other care providers ?24/7 contact phone numbers for assistance for urgent and routine care needs. ?The patient may stop care management services at any time by phone call to the office staff. ? ?Patient agreed to services and verbal consent obtained.  ? ?Follow up plan: ?Telephone appointment with care management team member scheduled for: 01/11/2022 ? ?Demari Gales, CCMA ?Care Guide, Embedded Care Coordination ?[place]  Care Management  ?Direct Dial: [phone] ? ? ?

## 2022-01-10 ENCOUNTER — Other Ambulatory Visit (HOSPITAL_COMMUNITY): Payer: Self-pay

## 2022-01-10 ENCOUNTER — Telehealth (HOSPITAL_COMMUNITY): Payer: Self-pay

## 2022-01-10 NOTE — Telephone Encounter (Signed)
Pharmacy Transitions of Care Follow-up Telephone Call ? ?Date of discharge: 01/04/2022  ?Discharge Diagnosis: NSTEMI ? ?How have you been since you were released from the hospital? good  ? ?Medication changes made at discharge: ?START taking: ?Aspirin Low Dose (aspirin)  ?atorvastatin (LIPITOR)  ?carvedilol (COREG)  ?clopidogrel (PLAVIX)  ?nitroGLYCERIN (NITROSTAT)  ?pantoprazole (PROTONIX)  ?CHANGE how you take: ?furosemide (LASIX)  ?insulin lispro (HumaLOG)  ?STOP taking: ?amLODipine 5 MG tablet (NORVASC)  ?omeprazole 40 MG capsule (PRILOSEC)  ?pravastatin 40 MG tablet (PRAVACHOL)  ? ?Medication changes verified by the patient? Yes ?  ? ?Medication Accessibility: ? ?Home Pharmacy: [address]  ? ?Was the patient provided with refills on discharged medications? yes  ? ?Have all prescriptions been transferred from [facility] to home pharmacy? yes  ? ?Is the patient able to afford medications? yes ?  ? ?Medication Review: ?CLOPIDOGREL (PLAVIX) ?Clopidogrel 75 mg once daily.  ?- Placed on dual antiplatelet therapy with ASA/plavix for one month along with Eliquis, stopping ASA after one month. ?- Reviewed potential DDIs with patient  ?- Advised patient of medications to avoid (NSAIDs, ASA)  ?- Educated that Tylenol (acetaminophen) will be the preferred analgesic to prevent risk of bleeding  ?- Emphasized importance of monitoring for signs and symptoms of bleeding (abnormal bruising, prolonged bleeding, nose bleeds, bleeding from gums, discolored urine, black tarry stools)  ?- Advised patient to alert all providers of anticoagulation therapy prior to starting a new medication or having a procedure  ? ?Follow-up Appointments: ? ?[facility] f/u appt confirmed? Cardiology Scheduled to see Ermalinda Barrios on 01/17/2022 @ 8:45am.  ? ?If their condition worsens, is the pt aware to call PCP or go to the Emergency Dept.? yes ? ?Final Patient Assessment: ?-Pt is doing well.  ?-Pt verbalized understanding of Plavix.  ?-Pt has  post discharge appointment and refill sent to [facility] on [address], [place].  ? ? ?

## 2022-01-11 ENCOUNTER — Telehealth (HOSPITAL_COMMUNITY): Payer: Self-pay

## 2022-01-11 ENCOUNTER — Telehealth: Payer: Medicare Other

## 2022-01-11 NOTE — Chronic Care Management (AMB) (Signed)
?  Care Management  ? ?Note ? ?01/11/2022 ?Name: [name] MRN: [id number] DOB: 08-12-1936 ? ?Pt of Jodi Mourning, FNP, moved pt from [facility] to [facility] schedule for 01/13/2022. Pt and case managers made aware. ? ?Tayjon Halladay, CCMA ?Care Guide, Embedded Care Coordination ?[place]  Care Management  ?Direct Dial: [phone] ? ? ?

## 2022-01-11 NOTE — Telephone Encounter (Signed)
Called patient to see if she is interested in the Cardiac Rehab Program. Patient expressed interest. Explained scheduling process and went over insurance, patient verbalized understanding. Will contact patient for scheduling once f/u has been completed. 

## 2022-01-11 NOTE — Telephone Encounter (Signed)
Pt insurance is active and benefits verified through [facility] Medicare Co-pay 0, DED 0/0 met, out of pocket $8,300/$238.67 met, co-insurance 20%. no pre-authorization required. Passport, 01/11/2022_0 :54am, REF# [id number] ?  ?2ndary insurance is active and benefits verified through Medicaid. Co-pay 0, DED 0/0 met, out of pocket 0/0 met, co-insurance 0%. No pre-authorization required. Passport, 01/11/2022_1 :39am, REF# [id number] ?  ?Will contact patient to see if she is interested in the Cardiac Rehab Program. If interested, patient will need to complete follow up appt. Once completed, patient will be contacted for scheduling upon review by the RN Navigator. ?

## 2022-01-13 ENCOUNTER — Ambulatory Visit: Payer: Medicare Other

## 2022-01-13 NOTE — Patient Instructions (Signed)
Visit Information  Thank you for allowing me to share the care management and care coordination services that are available to you as part of your health plan and services through your primary care provider and medical home. Please reach out to me at [phone] if the care management/care coordination team may be of assistance to you in the future.   Irys Nigh, RN, MSN, BSN, CCM Care Management Coordinator [facility] [phone]  

## 2022-01-13 NOTE — Chronic Care Management (AMB) (Signed)
?  Care Management  ? ?Outreach Note ? ?01/13/2022 ?Name: [name] MRN: [id number] DOB: 1936/02/04 ? ?Referred by: [name], [place] ?Reason for referral : Chronic Care Management (RNCM Initial telephone assessment) ? ? ?Successful contact was made with the patient to discuss care management and care coordination services. Patient declines engagement at this time.  Mrs. Mikels reports her daughter is helping manage her care. She reports she has all her medications and declines care management/coordination services. RNCM reinforced to contact Primary Care Provider or RNCM if needs change. ? ?Follow Up Plan: No further follow up required ? ? ?Thea Silversmith, RN, MSN, BSN, CCM ?Care Management Coordinator ?[facility] ?[phone]  ?

## 2022-01-14 ENCOUNTER — Other Ambulatory Visit: Payer: Self-pay | Admitting: Endocrinology

## 2022-01-17 ENCOUNTER — Encounter: Payer: Self-pay | Admitting: Physician Assistant

## 2022-01-17 ENCOUNTER — Ambulatory Visit (INDEPENDENT_AMBULATORY_CARE_PROVIDER_SITE_OTHER): Payer: Medicare Other | Admitting: Physician Assistant

## 2022-01-17 VITALS — BP 112/70 | HR 76 | Ht 67.0 in | Wt 170.0 lb

## 2022-01-17 DIAGNOSIS — I255 Ischemic cardiomyopathy: Secondary | ICD-10-CM | POA: Diagnosis not present

## 2022-01-17 DIAGNOSIS — I5021 Acute systolic (congestive) heart failure: Secondary | ICD-10-CM | POA: Diagnosis not present

## 2022-01-17 DIAGNOSIS — I4891 Unspecified atrial fibrillation: Secondary | ICD-10-CM

## 2022-01-17 DIAGNOSIS — E1122 Type 2 diabetes mellitus with diabetic chronic kidney disease: Secondary | ICD-10-CM | POA: Diagnosis not present

## 2022-01-17 DIAGNOSIS — I2510 Atherosclerotic heart disease of native coronary artery without angina pectoris: Secondary | ICD-10-CM | POA: Diagnosis not present

## 2022-01-17 DIAGNOSIS — J189 Pneumonia, unspecified organism: Secondary | ICD-10-CM

## 2022-01-17 DIAGNOSIS — D509 Iron deficiency anemia, unspecified: Secondary | ICD-10-CM | POA: Diagnosis not present

## 2022-01-17 DIAGNOSIS — I129 Hypertensive chronic kidney disease with stage 1 through stage 4 chronic kidney disease, or unspecified chronic kidney disease: Secondary | ICD-10-CM | POA: Diagnosis not present

## 2022-01-17 DIAGNOSIS — N184 Chronic kidney disease, stage 4 (severe): Secondary | ICD-10-CM | POA: Diagnosis not present

## 2022-01-17 DIAGNOSIS — E785 Hyperlipidemia, unspecified: Secondary | ICD-10-CM

## 2022-01-17 DIAGNOSIS — I442 Atrioventricular block, complete: Secondary | ICD-10-CM

## 2022-01-17 DIAGNOSIS — N179 Acute kidney failure, unspecified: Secondary | ICD-10-CM | POA: Diagnosis not present

## 2022-01-17 MED ORDER — AZITHROMYCIN 250 MG PO TABS: ORAL_TABLET | ORAL | 0 refills | Status: DC

## 2022-01-17 NOTE — Patient Instructions (Signed)
Medication Instructions:  ?Your physician has recommended you make the following change in your medication:  ? ?START: Zpak 250mg  today. Take 2 tablets on day then then take 1 tablet for 4 days. ?STOP: Aspirin on 01/30/2022 ? ?*If you need a refill on your cardiac medications before your next appointment, please call your pharmacy* ? ? ?Lab Work: ?None  ?If you have labs (blood work) drawn today and your tests are completely normal, you will receive your results only by: ?MyChart Message (if you have MyChart) OR ?A paper copy in the mail ?If you have any lab test that is abnormal or we need to change your treatment, we will call you to review the results. ? ? ?Testing/Procedures: ?Your physician has requested that you have an echocardiogram in 3 months. Echocardiography is a painless test that uses sound waves to create images of your heart. It provides your doctor with information about the size and shape of your heart and how well your heart?s chambers and valves are working. This procedure takes approximately one hour. There are no restrictions for this procedure. ? ? ?Follow-Up: ?At [facility], you and your health needs are our priority.  As part of our continuing mission to provide you with exceptional heart care, we have created designated Provider Care Teams.  These Care Teams include your primary Cardiologist (physician) and Advanced Practice Providers (APPs -  Physician Assistants and Nurse Practitioners) who all work together to provide you with the care you need, when you need it. ? ?We recommend signing up for the patient portal called "MyChart".  Sign up information is provided on this After Visit Summary.  MyChart is used to connect with patients for Virtual Visits (Telemedicine).  Patients are able to view lab/test results, encounter notes, upcoming appointments, etc.  Non-urgent messages can be sent to your provider as well.   ?To learn more about what you can do with MyChart, go to  [email or web address].   ? ?Your next appointment:   ?3 month(s) - Can be same day as Echo ? ?The format for your next appointment:   ?In Person ? ?Provider:   ?Dorris Carnes, MD   ? ? ?Important Information About Sugar ? ? ? ? ?  ?

## 2022-01-18 ENCOUNTER — Telehealth: Payer: Self-pay | Admitting: Internal Medicine

## 2022-01-18 MED ORDER — PREDNISONE 10 MG PO TABS: ORAL_TABLET | ORAL | 0 refills | Status: AC

## 2022-01-18 NOTE — Telephone Encounter (Signed)
Called and spoke with patient to let her know that Dr. Melvyn Novas would like to send in RX for her to take and for her to make sure she keeps her upcoming appt that is scheduled with him. Patient expressed understanding and verified preferred pharmacy. RX has been sent. Nothing further needed at this time.  ?

## 2022-01-18 NOTE — Telephone Encounter (Signed)
Called and spoke with pt who states she began to have an asthma flare yesterday 01/17/22. Stated she had an appt with a different doctor yesterday but due to her having an asthma flare, they told her to contact our office. ? ?Pt said she has had to use her rescue inhaler at least 3 times daily to see if she could get some relief. Pt is using all her other meds as prescribed. Said that she has been coughing getting up yellow phlegm, is wheezing, and also states that she has been nauseous. ? ? ?Pt is requesting to have prednisone sent in as well as something to help with the nausea. Dr. Melvyn Novas, please advise on this for pt. ?

## 2022-01-18 NOTE — Telephone Encounter (Signed)
Don't know why she'd have nausea with asthma flare but ideally needs to be seen in UC for covid testing if not already done.  ? ?Meantime Ok to try Prednisone 10 mg take  4 each am x 2 days,   2 each am x 2 days,  1 each am x 2 days and stop and make sure she has f/u with me which is overdue (q 6 m was the rec) ?

## 2022-01-23 ENCOUNTER — Other Ambulatory Visit: Payer: Self-pay

## 2022-01-23 ENCOUNTER — Encounter (HOSPITAL_BASED_OUTPATIENT_CLINIC_OR_DEPARTMENT_OTHER): Payer: Self-pay

## 2022-01-23 ENCOUNTER — Telehealth: Payer: Self-pay | Admitting: Internal Medicine

## 2022-01-23 ENCOUNTER — Emergency Department (HOSPITAL_BASED_OUTPATIENT_CLINIC_OR_DEPARTMENT_OTHER): Payer: Medicare Other

## 2022-01-23 ENCOUNTER — Emergency Department (HOSPITAL_BASED_OUTPATIENT_CLINIC_OR_DEPARTMENT_OTHER)
Admission: EM | Admit: 2022-01-23 | Discharge: 2022-01-24 | Disposition: A | Discharge status: Home / Self Care | Payer: Medicare Other | Attending: Emergency Medicine | Admitting: Emergency Medicine

## 2022-01-23 DIAGNOSIS — Z794 Long term (current) use of insulin: Secondary | ICD-10-CM | POA: Diagnosis not present

## 2022-01-23 DIAGNOSIS — R0602 Shortness of breath: Secondary | ICD-10-CM | POA: Diagnosis not present

## 2022-01-23 DIAGNOSIS — I110 Hypertensive heart disease with heart failure: Secondary | ICD-10-CM | POA: Diagnosis not present

## 2022-01-23 DIAGNOSIS — Z79899 Other long term (current) drug therapy: Secondary | ICD-10-CM | POA: Diagnosis not present

## 2022-01-23 DIAGNOSIS — J441 Chronic obstructive pulmonary disease with (acute) exacerbation: Secondary | ICD-10-CM | POA: Diagnosis not present

## 2022-01-23 DIAGNOSIS — Z950 Presence of cardiac pacemaker: Secondary | ICD-10-CM | POA: Insufficient documentation

## 2022-01-23 DIAGNOSIS — E1110 Type 2 diabetes mellitus with ketoacidosis without coma: Secondary | ICD-10-CM | POA: Diagnosis not present

## 2022-01-23 DIAGNOSIS — I2510 Atherosclerotic heart disease of native coronary artery without angina pectoris: Secondary | ICD-10-CM | POA: Insufficient documentation

## 2022-01-23 DIAGNOSIS — J45909 Unspecified asthma, uncomplicated: Secondary | ICD-10-CM | POA: Diagnosis not present

## 2022-01-23 DIAGNOSIS — Z7951 Long term (current) use of inhaled steroids: Secondary | ICD-10-CM | POA: Insufficient documentation

## 2022-01-23 DIAGNOSIS — Z7901 Long term (current) use of anticoagulants: Secondary | ICD-10-CM | POA: Insufficient documentation

## 2022-01-23 DIAGNOSIS — R062 Wheezing: Secondary | ICD-10-CM | POA: Diagnosis not present

## 2022-01-23 DIAGNOSIS — I509 Heart failure, unspecified: Secondary | ICD-10-CM | POA: Diagnosis not present

## 2022-01-23 DIAGNOSIS — I4891 Unspecified atrial fibrillation: Secondary | ICD-10-CM | POA: Insufficient documentation

## 2022-01-23 DIAGNOSIS — I517 Cardiomegaly: Secondary | ICD-10-CM | POA: Diagnosis not present

## 2022-01-23 DIAGNOSIS — Z7982 Long term (current) use of aspirin: Secondary | ICD-10-CM | POA: Insufficient documentation

## 2022-01-23 DIAGNOSIS — Z20822 Contact with and (suspected) exposure to covid-19: Secondary | ICD-10-CM | POA: Insufficient documentation

## 2022-01-23 LAB — COMPREHENSIVE METABOLIC PANEL
ALT: 45 U/L — ABNORMAL HIGH (ref 0–44)
AST: 34 U/L (ref 15–41)
Albumin: 3.6 g/dL (ref 3.5–5.0)
Alkaline Phosphatase: 88 U/L (ref 38–126)
Anion gap: 11 (ref 5–15)
BUN: 61 mg/dL — ABNORMAL HIGH (ref 8–23)
CO2: 20 mmol/L — ABNORMAL LOW (ref 22–32)
Calcium: 9.2 mg/dL (ref 8.9–10.3)
Chloride: 102 mmol/L (ref 98–111)
Creatinine, Ser: 2.28 mg/dL — ABNORMAL HIGH (ref 0.44–1.00)
GFR, Estimated: 21 mL/min — ABNORMAL LOW (ref >60)
Glucose, Bld: 399 mg/dL — ABNORMAL HIGH (ref 70–99)
Potassium: 4.2 mmol/L (ref 3.5–5.1)
Sodium: 133 mmol/L — ABNORMAL LOW (ref 135–145)
Total Bilirubin: 0.8 mg/dL (ref 0.3–1.2)
Total Protein: 7 g/dL (ref 6.5–8.1)

## 2022-01-23 LAB — CBC WITH DIFFERENTIAL/PLATELET
Abs Immature Granulocytes: 0.03 10*3/uL (ref 0.00–0.07)
Basophils Absolute: 0 10*3/uL (ref 0.0–0.1)
Basophils Relative: 0 %
Eosinophils Absolute: 0 10*3/uL (ref 0.0–0.5)
Eosinophils Relative: 0 %
HCT: 35.9 % — ABNORMAL LOW (ref 36.0–46.0)
Hemoglobin: 12.2 g/dL (ref 12.0–15.0)
Immature Granulocytes: 1 %
Lymphocytes Relative: 10 %
Lymphs Abs: 0.5 10*3/uL — ABNORMAL LOW (ref 0.7–4.0)
MCH: 27.1 pg (ref 26.0–34.0)
MCHC: 34 g/dL (ref 30.0–36.0)
MCV: 79.8 fL — ABNORMAL LOW (ref 80.0–100.0)
Monocytes Absolute: 0.3 10*3/uL (ref 0.1–1.0)
Monocytes Relative: 5 %
Neutro Abs: 4.5 10*3/uL (ref 1.7–7.7)
Neutrophils Relative %: 84 %
Platelets: 170 10*3/uL (ref 150–400)
RBC: 4.5 MIL/uL (ref 3.87–5.11)
RDW: 18.7 % — ABNORMAL HIGH (ref 11.5–15.5)
WBC: 5.4 10*3/uL (ref 4.0–10.5)
nRBC: 0 % (ref 0.0–0.2)

## 2022-01-23 LAB — TROPONIN I (HIGH SENSITIVITY)
Troponin I (High Sensitivity): 22 ng/L — ABNORMAL HIGH (ref <18)
Troponin I (High Sensitivity): 22 ng/L — ABNORMAL HIGH (ref <18)

## 2022-01-23 LAB — RESP PANEL BY RT-PCR (FLU A&B, COVID) ARPGX2
Influenza A by PCR: NEGATIVE
Influenza B by PCR: NEGATIVE
SARS Coronavirus 2 by RT PCR: NEGATIVE

## 2022-01-23 LAB — BRAIN NATRIURETIC PEPTIDE: B Natriuretic Peptide: 337.2 pg/mL — ABNORMAL HIGH (ref 0.0–100.0)

## 2022-01-23 MED ORDER — ALBUTEROL SULFATE HFA 108 (90 BASE) MCG/ACT IN AERS: 2.0000 | INHALATION_SPRAY | RESPIRATORY_TRACT | Status: DC | PRN

## 2022-01-23 MED ORDER — ALBUTEROL SULFATE (2.5 MG/3ML) 0.083% IN NEBU
5.0000 mg | INHALATION_SOLUTION | Freq: Once | RESPIRATORY_TRACT | Status: AC
  Administered 2022-01-23: 5 mg via RESPIRATORY_TRACT

## 2022-01-23 MED ORDER — ALBUTEROL SULFATE (2.5 MG/3ML) 0.083% IN NEBU
INHALATION_SOLUTION | RESPIRATORY_TRACT | Status: AC
  Filled 2022-01-23: qty 6

## 2022-01-23 MED ORDER — PREDNISONE 20 MG PO TABS
40.0000 mg | ORAL_TABLET | Freq: Once | ORAL | Status: AC
  Administered 2022-01-23: 40 mg via ORAL
  Filled 2022-01-23: qty 2

## 2022-01-23 MED ORDER — PREDNISONE 20 MG PO TABS: 40.0000 mg | ORAL_TABLET | Freq: Every day | ORAL | 0 refills | Status: AC

## 2022-01-23 MED ORDER — ALBUTEROL SULFATE HFA 108 (90 BASE) MCG/ACT IN AERS
INHALATION_SPRAY | RESPIRATORY_TRACT | Status: AC
  Administered 2022-01-23: 2 via RESPIRATORY_TRACT
  Filled 2022-01-23: qty 6.7

## 2022-01-23 NOTE — ED Provider Notes (Signed)
?[facility] ?Provider Note ? ? ?CSN: [id number] ?Arrival date & time: 01/23/22  1759 ? ?  ? ?History ? ?Chief Complaint  ?Patient presents with  ? Shortness of Breath  ? ? ?[name] is a 86 y.o. female. ? ?The history is provided by the patient, medical records and a relative. No language interpreter was used.  ?Shortness of Breath ?Severity:  Severe ?Onset quality:  Gradual ?Duration:  1 week ?Timing:  Constant ?Progression:  Worsening ?Chronicity:  New ?Context: URI (some cough)   ?Relieved by:  Nothing ?Worsened by:  Exertion (layin down) ?Ineffective treatments:  Inhaler (steroids taper) ?Associated symptoms: cough, sputum production and wheezing   ?Associated symptoms: no abdominal pain, no chest pain, no diaphoresis, no fever, no headaches, no hemoptysis, no neck pain, no rash and no vomiting   ?Risk factors: no hx of PE/DVT   ? ?  ? ?Home Medications ?Prior to Admission medications   ?Medication Sig Start Date End Date Taking? Authorizing Provider  ?acetaminophen (TYLENOL) 325 MG tablet Take 650 mg by mouth every 6 (six) hours as needed for pain.    [provider]  ?albuterol (PROAIR HFA) 108 (90 Base) MCG/ACT inhaler Inhale 2 puffs into the lungs every 4 (four) hours as needed for wheezing. 05/05/19 02/04/22  Tanda Rockers, MD  ?Ascorbic Acid (VITAMIN C) 1000 MG tablet Take 1,000 mg by mouth daily.    [provider]  ?aspirin 81 MG EC tablet Take 1 tablet (81 mg total) by mouth daily. Swallow whole. 01/05/22   Cheryln Manly, NP  ?atorvastatin (LIPITOR) 80 MG tablet Take 1 tablet (80 mg total) by mouth daily. 01/05/22   Cheryln Manly, NP  ?azithromycin (ZITHROMAX) 250 MG tablet Take 2 tablets on day 1 then take 1 tablet daily for 4 days. 01/17/22   Imogene Burn, PA-C  ?Blood Glucose Monitoring Suppl ([place]) w/Device KIT USE AS DIRECTED 01/16/22   Renato Shin, MD  ?budesonide-formoterol [facility]) 160-4.5 MCG/ACT inhaler TAKE 2  PUFFS FIRST THING IN AM AND THEN ANOTHER 2 PUFFS ABOUT 12 HOURS LATER. 07/13/21   Tanda Rockers, MD  ?carvedilol (COREG) 3.125 MG tablet Take 1 tablet (3.125 mg total) by mouth 2 (two) times daily with a meal. 01/04/22   Cheryln Manly, NP  ?cetirizine (ZYRTEC ALLERGY) 10 MG tablet Take 1 tablet (10 mg total) by mouth daily as needed for allergies. 05/04/20   Tanda Rockers, MD  ?clopidogrel (PLAVIX) 75 MG tablet Take 1 tablet (75 mg total) by mouth daily with breakfast. 01/05/22   Reino Bellis B, NP  ?diclofenac Sodium (VOLTAREN) 1 % GEL Apply 4 g topically 4 (four) times daily as needed (pain). 01/27/21   [provider]  ?ELIQUIS 2.5 MG TABS tablet TAKE 1 TABLET BY MOUTH TWICE A DAY 11/21/21   Deboraha Sprang, MD  ?furosemide (LASIX) 40 MG tablet Take 1 tablet (40 mg total) by mouth 2 (two) times daily. 01/04/22   Cheryln Manly, NP  ?glucose blood (ONETOUCH VERIO) test strip 1 each by Other route 2 (two) times daily. And lancets 2/day 12/13/21   Renato Shin, MD  ?glucose blood test strip Check blood sugar twice a day 04/11/21   Renato Shin, MD  ?hydrALAZINE (APRESOLINE) 25 MG tablet TAKE 3 TABLETS (75 MG TOTAL) BY MOUTH 3 (THREE) TIMES DAILY. 10/20/21   Fay Records, MD  ?insulin lispro (HUMALOG) 100 UNIT/ML injection Give 3 units with BREAKFAST, AND  5 units with SUPPER 01/04/22   Reino Bellis B, NP  ?latanoprost (XALATAN) 0.005 % ophthalmic solution Place 1 drop into both eyes at bedtime. 03/16/21   [provider]  ?montelukast (SINGULAIR) 10 MG tablet TAKE 1 TABLET BY MOUTH EVERYDAY AT BEDTIME 12/22/21   Tanda Rockers, MD  ?nitroGLYCERIN (NITROSTAT) 0.4 MG SL tablet Place 1 tablet (0.4 mg total) under the tongue every 5 (five) minutes x 3 doses as needed for chest pain. 01/04/22   Cheryln Manly, NP  ?pantoprazole (PROTONIX) 40 MG tablet Take 1 tablet (40 mg total) by mouth 2 (two) times daily. 01/04/22   Cheryln Manly, NP  ?potassium chloride (KLOR-CON) 10 MEQ tablet TAKE 1  TABLET BY MOUTH EVERY DAY 09/20/21   Deboraha Sprang, MD  ?predniSONE (DELTASONE) 10 MG tablet Take 4 tablets (40 mg total) by mouth daily with breakfast for 2 days, THEN 2 tablets (20 mg total) daily with breakfast for 2 days, THEN 1 tablet (10 mg total) daily with breakfast for 2 days. 01/18/22 01/24/22  Tanda Rockers, MD  ?Syringe/Needle, Disp, (SYRINGE 3CC/25GX1") 25G X 1" 3 ML MISC Use to inject into the skin 2x a day 04/11/21   Renato Shin, MD  ?traMADol (ULTRAM) 50 MG tablet Take 50 mg by mouth every 6 (six) hours as needed for moderate pain. 10/12/21   [provider]  ?   ? ?Allergies    ?Aspirin and Ramipril   ? ?Review of Systems   ?Review of Systems  ?Constitutional:  Negative for chills, diaphoresis, fatigue and fever.  ?HENT:  Negative for congestion.   ?Respiratory:  Positive for cough, sputum production, shortness of breath and wheezing. Negative for hemoptysis, chest tightness and stridor.   ?Cardiovascular:  Negative for chest pain, palpitations and leg swelling.  ?Gastrointestinal:  Negative for abdominal pain, constipation, diarrhea, nausea and vomiting.  ?Genitourinary:  Negative for dysuria, flank pain, frequency and pelvic pain.  ?Musculoskeletal:  Negative for back pain, neck pain and neck stiffness.  ?Skin:  Negative for rash and wound.  ?Neurological:  Negative for weakness, light-headedness, numbness and headaches.  ?Psychiatric/Behavioral:  Negative for agitation and confusion.   ?All other systems reviewed and are negative. ? ?Physical Exam ?Updated Vital Signs ?BP 132/81 (BP Location: Right Arm)   Pulse 73   Temp 98.4 ?F (36.9 ?C)   Resp 20   Ht '5\' 7"'  (1.702 m)   Wt 77.1 kg   SpO2 99%   BMI 26.62 kg/m?  ?Physical Exam ?Vitals and nursing note reviewed.  ?Constitutional:   ?   General: She is not in acute distress. ?   Appearance: She is well-developed. She is not ill-appearing, toxic-appearing or diaphoretic.  ?HENT:  ?   Head: Normocephalic and atraumatic.  ?    Mouth/Throat:  ?   Mouth: Mucous membranes are moist.  ?Eyes:  ?   Conjunctiva/sclera: Conjunctivae normal.  ?   Pupils: Pupils are equal, round, and reactive to light.  ?Cardiovascular:  ?   Rate and Rhythm: Normal rate and regular rhythm. No extrasystoles are present. ?   Heart sounds: No murmur heard. ?Pulmonary:  ?   Effort: Tachypnea present.  ?   Breath sounds: No stridor. Wheezing, rhonchi and rales present.  ?Chest:  ?   Chest wall: No tenderness.  ?Abdominal:  ?   Palpations: Abdomen is soft.  ?   Tenderness: There is no abdominal tenderness.  ?Musculoskeletal:     ?   General: No  swelling.  ?   Cervical back: Neck supple.  ?   Right lower leg: No tenderness. No edema.  ?   Left lower leg: No tenderness. No edema.  ?Skin: ?   General: Skin is warm and dry.  ?   Capillary Refill: Capillary refill takes less than 2 seconds.  ?   Coloration: Skin is not pale.  ?   Findings: No erythema.  ?Neurological:  ?   Mental Status: She is alert.  ?Psychiatric:     ?   Mood and Affect: Mood normal.  ? ? ?ED Results / Procedures / Treatments   ?Labs ?(all labs ordered are listed, but only abnormal results are displayed) ?Labs Reviewed  ?CBC WITH DIFFERENTIAL/PLATELET - Abnormal; Notable for the following components:  ?    Result Value  ? HCT 35.9 (*)   ? MCV 79.8 (*)   ? RDW 18.7 (*)   ? Lymphs Abs 0.5 (*)   ? All other components within normal limits  ?COMPREHENSIVE METABOLIC PANEL - Abnormal; Notable for the following components:  ? Sodium 133 (*)   ? CO2 20 (*)   ? Glucose, Bld 399 (*)   ? BUN 61 (*)   ? Creatinine, Ser 2.28 (*)   ? ALT 45 (*)   ? GFR, Estimated 21 (*)   ? All other components within normal limits  ?BRAIN NATRIURETIC PEPTIDE - Abnormal; Notable for the following components:  ? B Natriuretic Peptide 337.2 (*)   ? All other components within normal limits  ?TROPONIN I (HIGH SENSITIVITY) - Abnormal; Notable for the following components:  ? Troponin I (High Sensitivity) 22 (*)   ? All other components  within normal limits  ?TROPONIN I (HIGH SENSITIVITY) - Abnormal; Notable for the following components:  ? Troponin I (High Sensitivity) 22 (*)   ? All other components within normal limits  ?RESP PANEL BY RT-PCR (FLU A&B, C

## 2022-01-23 NOTE — ED Triage Notes (Signed)
Patient here POV from Home. ? ?Patient endorses SOB for 3-4 Days. Constant since it began. States Inhaler has been Mildly Effective.  ? ?Mildly Productive Cough. No Known Fevers. Almost complete with Prednisone Prescription.  ? ?NAD Noted during Triage. A&Ox4. GCS 15. BIB Wheelchair ?

## 2022-01-23 NOTE — Discharge Instructions (Signed)
Your history, exam, work-up today are suggestive of a COPD exacerbation with all of the wheezing and short of breath you been having.  Your work-up overall was reassuring otherwise.  Your troponin was improved, your BNP was improved, as were the rest of your labs overall.  X-ray did not show big pneumonia.  We discussed with cardiology and they feel you are safe for discharge home to follow-up with your PCP tomorrow and pulmonology the next day.  Please take the burst of steroids to help with the continued wheezing and use the inhaler.  Please rest and stay hydrated.  If any symptoms change or worsen acutely, please return to the nearest emergency department. ?

## 2022-01-23 NOTE — Telephone Encounter (Signed)
Called and spoke with patient who states that she is having symptom of shortness of breath. Patient is scheduled for OV with Dr. Melvyn Novas on Wednesday. Nothing further needed at this time.  ?

## 2022-01-24 ENCOUNTER — Encounter: Payer: Self-pay | Admitting: Family

## 2022-01-24 ENCOUNTER — Ambulatory Visit (INDEPENDENT_AMBULATORY_CARE_PROVIDER_SITE_OTHER): Payer: Medicare Other | Admitting: Family

## 2022-01-24 VITALS — BP 132/60 | HR 73 | Temp 98.7°F | Ht 67.0 in | Wt 168.2 lb

## 2022-01-24 DIAGNOSIS — I2510 Atherosclerotic heart disease of native coronary artery without angina pectoris: Secondary | ICD-10-CM | POA: Diagnosis not present

## 2022-01-24 DIAGNOSIS — N183 Chronic kidney disease, stage 3 unspecified: Secondary | ICD-10-CM | POA: Diagnosis not present

## 2022-01-24 DIAGNOSIS — R0602 Shortness of breath: Secondary | ICD-10-CM

## 2022-01-24 DIAGNOSIS — R062 Wheezing: Secondary | ICD-10-CM | POA: Diagnosis not present

## 2022-01-24 DIAGNOSIS — I509 Heart failure, unspecified: Secondary | ICD-10-CM

## 2022-01-24 MED ORDER — ALBUTEROL SULFATE (2.5 MG/3ML) 0.083% IN NEBU: 2.5000 mg | INHALATION_SOLUTION | Freq: Four times a day (QID) | RESPIRATORY_TRACT | 1 refills | Status: DC | PRN

## 2022-01-24 MED ORDER — IPRATROPIUM-ALBUTEROL 0.5-2.5 (3) MG/3ML IN SOLN: 3.0000 mL | Freq: Once | RESPIRATORY_TRACT | Status: DC

## 2022-01-24 NOTE — Progress Notes (Signed)
?[name] is a 86 y.o. female with the following history as recorded in EpicCare:  ?Patient Active Problem List  ? Diagnosis Date Noted  ? Acute kidney injury superimposed on chronic kidney disease ([place]) 01/02/2022  ? NSTEMI (non-ST elevated myocardial infarction) ([place]) 12/23/2021  ? Osteoarthritis of knee 03/30/2021  ? Bilateral hand pain 03/08/2021  ? Complete heart block ([place]) 12/13/2020  ? Pacemaker - MDT 12/13/2020  ? SOB (shortness of breath) 07/29/2020  ? Encntr for surgical aftcr following surgery on the circ sys 07/26/2020  ? Pain in right knee 03/17/2020  ? Chronic combined systolic (congestive) and diastolic (congestive) heart failure (HCC)   ? DKA (diabetic ketoacidoses) 12/20/2019  ? Diverticulosis 12/10/2019  ? Age-related osteoporosis without current pathological fracture 10/03/2019  ? Chronic kidney disease, stage 3 unspecified ([place]) 10/03/2019  ? Long term (current) use of insulin ([place]) 10/03/2019  ? Hyperlipidemia, unspecified 10/03/2019  ? Shortness of breath 01/30/2018  ? Acute bronchiolitis 12/17/2017  ? Wheezing 10/29/2017  ? Vitamin D deficiency 07/31/2017  ? Renal insufficiency 07/31/2017  ? Knee pain 06/01/2017  ? Morbid obesity due to excess calories ([place]) 01/25/2017  ? Diabetes ([place]) 04/12/2016  ? Cough 01/20/2015  ? Atrial fibrillation ([place]) 05/28/2014  ? Personal history of colonic polyps 05/28/2014  ? Vomiting 01/29/2014  ? CAD (coronary artery disease) 09/01/2013  ? Routine general medical examination at a health care facility 07/17/2011  ? Encounter for long-term (current) use of other medications 07/06/2011  ? Nonspecific (abnormal) findings on radiological and other examination of body structure 11/09/2009  ? IRRITABLE BOWEL SYNDROME 05/07/2009  ? CHEST PAIN 05/07/2009  ? ADNEXAL MASS, RIGHT 07/22/2008  ? HEMORRHOIDS, RECURRENT 01/27/2008  ? Diverticulitis 01/27/2008  ? OTH ABNORMAL FIND RAD EXAMINATION BREAST 01/27/2008  ? GERD 01/13/2008  ? UTI 09/28/2007  ? Dyslipidemia  05/27/2007  ? ANEMIA-IRON DEFICIENCY 05/27/2007  ? ANXIETY 05/27/2007  ? Essential hypertension 05/27/2007  ? Seasonal allergic rhinitis 05/27/2007  ? Cough variant asthma 05/27/2007  ? Osteoporosis 05/27/2007  ? DIVERTICULITIS, HX OF 05/27/2007  ?  ?Current Outpatient Medications  ?Medication Sig Dispense Refill  ? acetaminophen (TYLENOL) 325 MG tablet Take 650 mg by mouth every 6 (six) hours as needed for pain.    ? albuterol (PROAIR HFA) 108 (90 Base) MCG/ACT inhaler Inhale 2 puffs into the lungs every 4 (four) hours as needed for wheezing. 18 g 11  ? albuterol (PROVENTIL) (2.5 MG/3ML) 0.083% nebulizer solution Take 3 mLs (2.5 mg total) by nebulization every 6 (six) hours as needed for wheezing or shortness of breath. 150 mL 1  ? Ascorbic Acid (VITAMIN C) 1000 MG tablet Take 1,000 mg by mouth daily.    ? aspirin 81 MG EC tablet Take 1 tablet (81 mg total) by mouth daily. Swallow whole. 30 tablet 0  ? atorvastatin (LIPITOR) 80 MG tablet Take 1 tablet (80 mg total) by mouth daily. 90 tablet 1  ? budesonide-formoterol (SYMBICORT) 160-4.5 MCG/ACT inhaler TAKE 2 PUFFS FIRST THING IN AM AND THEN ANOTHER 2 PUFFS ABOUT 12 HOURS LATER. 30.6 each 5  ? carvedilol (COREG) 3.125 MG tablet Take 1 tablet (3.125 mg total) by mouth 2 (two) times daily with a meal. 180 tablet 0  ? cetirizine (ZYRTEC ALLERGY) 10 MG tablet Take 1 tablet (10 mg total) by mouth daily as needed for allergies.    ? clopidogrel (PLAVIX) 75 MG tablet Take 1 tablet (75 mg total) by mouth daily with breakfast. 90 tablet 2  ? diclofenac Sodium (  VOLTAREN) 1 % GEL Apply 4 g topically 4 (four) times daily as needed (pain).    ? ELIQUIS 2.5 MG TABS tablet TAKE 1 TABLET BY MOUTH TWICE A DAY 60 tablet 6  ? furosemide (LASIX) 40 MG tablet Take 1 tablet (40 mg total) by mouth 2 (two) times daily. 60 tablet 1  ? hydrALAZINE (APRESOLINE) 25 MG tablet TAKE 3 TABLETS (75 MG TOTAL) BY MOUTH 3 (THREE) TIMES DAILY. 810 tablet 2  ? insulin lispro (HUMALOG) 100 UNIT/ML  injection Give 3 units with BREAKFAST, AND 5 units with SUPPER 60 mL 1  ? latanoprost (XALATAN) 0.005 % ophthalmic solution Place 1 drop into both eyes at bedtime.    ? montelukast (SINGULAIR) 10 MG tablet TAKE 1 TABLET BY MOUTH EVERYDAY AT BEDTIME 90 tablet 3  ? nitroGLYCERIN (NITROSTAT) 0.4 MG SL tablet Place 1 tablet (0.4 mg total) under the tongue every 5 (five) minutes x 3 doses as needed for chest pain. 25 tablet 2  ? pantoprazole (PROTONIX) 40 MG tablet Take 1 tablet (40 mg total) by mouth 2 (two) times daily. 180 tablet 0  ? potassium chloride (KLOR-CON) 10 MEQ tablet TAKE 1 TABLET BY MOUTH EVERY DAY 90 tablet 2  ? predniSONE (DELTASONE) 10 MG tablet Take 4 tablets (40 mg total) by mouth daily with breakfast for 2 days, THEN 2 tablets (20 mg total) daily with breakfast for 2 days, THEN 1 tablet (10 mg total) daily with breakfast for 2 days. 14 tablet 0  ? predniSONE (DELTASONE) 20 MG tablet Take 2 tablets (40 mg total) by mouth daily for 4 days. 8 tablet 0  ? Syringe/Needle, Disp, (SYRINGE 3CC/25GX1") 25G X 1" 3 ML MISC Use to inject into the skin 2x a day 100 each 3  ? traMADol (ULTRAM) 50 MG tablet Take 50 mg by mouth every 6 (six) hours as needed for moderate pain.    ? Blood Glucose Monitoring Suppl ([place]) w/Device KIT USE AS DIRECTED (Patient not taking: Reported on 01/24/2022) 1 kit .  ? glucose blood (ONETOUCH VERIO) test strip 1 each by Other route 2 (two) times daily. And lancets 2/day (Patient not taking: Reported on 01/24/2022) 200 each 3  ? glucose blood test strip Check blood sugar twice a day (Patient not taking: Reported on 01/24/2022) 100 each 12  ? ?Current Facility-Administered Medications  ?Medication Dose Route Frequency Provider Last Rate Last Admin  ? ipratropium-albuterol (DUONEB) 0.5-2.5 (3) MG/3ML nebulizer solution 3 mL  3 mL Nebulization Once [name], FNP      ?  ?Allergies: Aspirin and Ramipril  ?Past Medical History:  ?Diagnosis Date  ? Allergic  rhinitis   ? Anterior chest wall pain   ? Anxiety   ? Asthma   ? Chronic diastolic CHF (congestive heart failure) ([place])   ? Echo 01/2020: EF 60-65, normal wall motion, mild LVH, normal RV SF, RVSP 52.6 (moderate elevation), severe LAE, moderate RAE, trivial MR, mild MS (mean gradient 5.5 mmHg), mild aortic valve sclerosis (no AS); elevated E/e' c/w elevated LVEDP  ? Cough   ? DM type 2 (diabetes mellitus, type 2) ([place])   ? GERD (gastroesophageal reflux disease)   ? History of diverticulitis of colon   ? HTN (hypertension)   ? Hyperlipidemia   ? IBS (irritable bowel syndrome)   ? Iron deficiency anemia   ? Osteoporosis, unspecified   ? Renal insufficiency 07/31/2017  ? UTI (urinary tract infection)   ?  ?Past Surgical History:  ?  Procedure Laterality Date  ? CARDIOVASCULAR STRESS TEST  02/25/04  ? CORONARY BALLOON ANGIOPLASTY N/A 12/28/2021  ? Procedure: CORONARY BALLOON ANGIOPLASTY;  Surgeon: Burnell Blanks, MD;  Location: [facility];  Service: Cardiovascular;  Laterality: N/A;  ? CORONARY STENT INTERVENTION N/A 12/28/2021  ? Procedure: CORONARY STENT INTERVENTION;  Surgeon: Burnell Blanks, MD;  Location: [facility];  Service: Cardiovascular;  Laterality: N/A;  ? ESOPHAGOGASTRODUODENOSCOPY  12/26/01  ? PACEMAKER IMPLANT N/A 08/02/2020  ? Procedure: PACEMAKER IMPLANT;  Surgeon: Deboraha Sprang, MD;  Location: [facility];  Service: Cardiovascular;  Laterality: N/A;  ? RIGHT OOPHORECTOMY  jan 2010  ? RIGHT/LEFT HEART CATH AND CORONARY ANGIOGRAPHY N/A 12/28/2021  ? Procedure: RIGHT/LEFT HEART CATH AND CORONARY ANGIOGRAPHY;  Surgeon: Burnell Blanks, MD;  Location: [facility];  Service: Cardiovascular;  Laterality: N/A;  ?  ?Family History  ?Problem Relation Age of Onset  ? Lung cancer Brother   ? Melanoma Brother   ? Hypertension Mother   ? Stroke Mother   ? Other Father   ?     poor circulation  ? Cancer Sister   ? Diabetes Daughter   ? Atopy Neg Hx   ? Asthma Neg Hx    ? Breast cancer Neg Hx   ?  ?Social History  ? ?Tobacco Use  ? Smoking status: Former  ?  Packs/day: 0.30  ?  Years: 5.00  ?  Pack years: 1.50  ?  Types: Cigarettes  ?  Quit date: 10/02/1986  ?  Years since quitt

## 2022-01-25 ENCOUNTER — Encounter: Payer: Self-pay | Admitting: Internal Medicine

## 2022-01-25 ENCOUNTER — Telehealth: Payer: Self-pay | Admitting: *Deleted

## 2022-01-25 ENCOUNTER — Ambulatory Visit (INDEPENDENT_AMBULATORY_CARE_PROVIDER_SITE_OTHER): Payer: Medicare Other

## 2022-01-25 ENCOUNTER — Other Ambulatory Visit: Payer: Self-pay | Admitting: Family

## 2022-01-25 ENCOUNTER — Ambulatory Visit (INDEPENDENT_AMBULATORY_CARE_PROVIDER_SITE_OTHER): Payer: Medicare Other | Admitting: Internal Medicine

## 2022-01-25 DIAGNOSIS — J45991 Cough variant asthma: Secondary | ICD-10-CM | POA: Diagnosis not present

## 2022-01-25 DIAGNOSIS — R059 Cough, unspecified: Secondary | ICD-10-CM | POA: Diagnosis not present

## 2022-01-25 MED ORDER — AZITHROMYCIN 250 MG PO TABS: ORAL_TABLET | ORAL | 0 refills | Status: DC

## 2022-01-25 MED ORDER — ALBUTEROL SULFATE HFA 108 (90 BASE) MCG/ACT IN AERS: INHALATION_SPRAY | RESPIRATORY_TRACT | 11 refills | Status: DC

## 2022-01-25 NOTE — Assessment & Plan Note (Signed)
PFT's 03/18/08 minimal airflow obstruction  ?-PFT's January 04, 2010 No sign airflow obstruction   ?- FENO 01/25/2017  =   23 p am symb 160 x 2 pffs ?- Spirometry 01/25/2017  wnl x for min curvature on f/v p am symb 160 x 2 / no saba prior ?- 07/30/2017   After extensive coaching HFA effectiveness =    75% from baseline 50%  ?- Allergy profile 07/30/17  >  Eos 0.2 /  IgE  145  RAST pos mold  ?- FENO 01/28/2018  =   17 ?- Spirometry 01/28/2018  FEV1 1.22 (65%)  Ratio 79  ? - 07/04/2021  After extensive coaching inhaler device,  effectiveness =  75% from baseline 25%   ?- 01/25/2022  After extensive coaching inhaler device,  effectiveness =    75% from a baseline of 50%  ? ?? Component cardiac asthma but already under the care of cardiology addressing vol overload so will focus here on the airway by rx symb 160 2bid and approp saba ? ?Re SABA :  I spent extra time with pt today reviewing appropriate use of albuterol for prn use on exertion with the following points: ?1) saba is for relief of sob that does not improve by walking a slower pace or resting but rather if the pt does not improve after trying this first. ?2) If the pt is convinced, as many are, that saba helps recover from activity faster then it's easy to tell if this is the case by re-challenging : ie stop, take the inhaler, then p 5 minutes try the exact same activity (intensity of workload) that just caused the symptoms and see if they are substantially diminished or not after saba ?3) if there is an activity that reproducibly causes the symptoms, try the saba 15 min before the activity on alternate days  ? ?If in fact the saba really does help, then fine to continue to use it prn but advised may need to look closer at the maintenance regimen being used to achieve better control of airways disease with exertion.  ? ?rx ? Mild bronchitis with zpak and gerd diet and no further abx  ? ?    ?  ? ?Each maintenance medication was reviewed in detail including emphasizing  most importantly the difference between maintenance and prns and under what circumstances the prns are to be triggered using an action plan format where appropriate. ? ?Total time for H and P, chart review, counseling, reviewing hfa  device(s) and generating customized AVS unique to this office visit / same day charting  > 30 min  ?     ?

## 2022-01-25 NOTE — Telephone Encounter (Signed)
Call Type Triage / Clinical ?Caller Name Sharyn Lull Trap ?Relationship To Patient Daughter ?Return Phone Number [phone] (Primary) ?Chief Complaint Prescription Refill or Medication Request (non ?symptomatic) ?Reason for Call Symptomatic / Request for Health Information ?Initial Comment Caller states her mom was seen today. They are ?out of the medication needed and would like to see ?if they can get something else. ?Translation No ?Nurse Assessment ?Nurse: Gilford Rile, RN, P[place] Date/Time Eilene Ghazi Time): 01/24/2022 5:54:17 PM ?Confirm and document reason for call. If ?symptomatic, describe symptoms. ?---Caller states her mom was seen today. Was ?prescribed Al[facility]erol [facility] and would like to see if they can get ?something else. Was given breathing treatment in the ?office and was supposed to get an Albuterol treatment ?at home at 5:30pm. Martin Majestic to ED last night for shortness ?of breath. ? ?Time) Disposition Final User ?01/24/2022 6:04:53 PM Paged On Call back to Call [place], [place], [place] ?01/24/2022 6:06:02 PM Paged On Call back to Call Center [place], [place], [place] ?01/24/2022 6:24:14 PM Called On-Call Provider Gilford Rile, RN, [place] ?01/24/2022 6:29:41 PM Send To RN Personal Gilford Rile, RN, [place] ?01/24/2022 7:21:47 PM Pharmacy Call Gilford Rile, RN, [place] ?Reason: RN called [facility] at [phone] and spoke with Suezanne Jacquet who ?says they will close soon but he will ?send the Rx to the [facility] ?location for pickup today. ?01/24/2022 6:05:23 PM Call PCP Now Yes Gilford Rile, RN, [place] ?Caller Disagree/Comply Comply ?Caller Understands Yes ?PreDisposition Call Doctor ?Care Advice Given Per Guideline ?CALL PCP NOW: * You need to discuss this with your doctor (or NP/PA). CALL BACK IF: * You become worse CARE ADVICE ?given per Recent Medical Visit for Illness: Follow-Up Call (Adult) guideline. ?Comments ?User: Loma Boston, RN Date/Time Eilene Ghazi Time): 01/24/2022 6:29:14 PM ?Caller was informed of info  received from OCP. Caller states she has not yet found a pharmacy who has the ?medication in stock but will continue to look. Caller requested for RN to call her back in about 30 minutes. ?User: Loma Boston, RN Date/Time Eilene Ghazi Time): 01/24/2022 7:23:29 PM ?Caller was told that the Rx is being sent to the [facility] and was told she can call that ?location to ask what time she can pick up the Rx today. Caller voiced understanding. ?Paging ?DoctorName Phone DateTime Result/ ?Outcome Message Type Notes ?Billey Gosling - MD [phone] ?01/24/2022 ?6:04:53 ?PM ?Called On Call ?Provider - Left ?Message ?Doctor Paged ?Billey Gosling - MD [phone] ?01/24/2022 ?6:24:14 ?PM ?Called On ?Call Provider - ?Reached ?Doctor Paged ?Billey Gosling - MD ?01/24/2022 ?6:25:43 ?PM ?Spoke with On ?Call - General Message Result ?OCP states RN may call in order ?for Albuterol Nebulizer Solution ?2.5mg /72mL 0.083% (146mL ? ?

## 2022-01-25 NOTE — Progress Notes (Signed)
Subjective:  ?Patient ID: [name], female   DOB: 1936/04/17 .   MRN: [id number] ? ?Brief patient profile:  ?29 yobf quit smoking 1988 with h/o asthma  And nl baseline pfts ( as of 01/04/2010) who had been using Advair on a p.r.n. basis and noticing increasing dyspnea and need for rescue therapy x one mo when seen 01/06/08 for pulmonary evaluation so Advair stopped. Began Symbicort 160/4.33mg 2 puffs two times a day.  ?Returned 02/13/08 improved with decreased dyspnea and no rescue use  ? ?Returned 03/18/08 symptom free no rescue needed, stopped reglan, no flare ? ? ?05/04/2020  f/u ov/Malanie Koloski re: asthma/ rhinitis not sure about ppi/ using benadryl helps some  ?Chief Complaint  ?Patient presents with  ? Follow-up  ?  Increased SOB and chest tightness over the past month. She has some cough- prod with min light yellow sputum. She is using her albuterol inhaler about 2 x per day.   ?Dyspnea:  MMRC2 = can't walk a nl pace on a flat grade s sob but does fine slow and flat  ?Cough: more cough assoc with pnds day > noct/ not taking omeprazole  ?Sleeping: 2 pillows ok  ?SABA use: 2x daily  ?02: none  ?rec ?Plan A = Automatic = Always=    Symbicort 160 Take 2 puffs first thing in am and then another 2 puffs about 12 hours later.  ?Work on inhaler technique:    ?Omerprazole 40 mg Take 30-60 min before first meal of the day  ?Prednisone 10 mg take  4 each am x 2 days,   2 each am x 2 days,  1 each am x 2 days and stop  ?Plan B = Backup (to supplement plan A, not to replace it) ?Only use your albuterol inhaler as a rescue medication  ?Please schedule vaccine > pfizer 2nd shot on 06/04/20  ? ?  ? ? ?07/04/2021  f/u ov/Jian Hodgman re: cough variant asthma   maint on symbicort 160 2bid/ singulair  - no longer on gerd rx  ?Chief Complaint  ?Patient presents with  ? Follow-up  ?  Still coughing some  ? Dyspnea:  baseline still MMRC2 = can't walk a nl pace on a flat grade s sob but does fine slow and flat eg Mall walking  ?Cough: worse x one week, slt  yellow not on gerd rx  ?Sleeping: flat bed/ 2 pillows- no noct symptoms ?SABA use: started up using it one week prior to OV  twice daily  ?02: none  ?Covid status:   2 vax / never got covid  ?Rec ?At onset cough the 1st thing you should do :  Try prilosec otc 254m Take 30-60 min before first meal of the day and Pepcid ac (famotidine) 20 mg one @  bedtime until cough is completely gone   ?Prednisone 10 mg take  4 each am x 2 days,   2 each am x 2 days,  1 each am x 2 days and stop  ?Zpak  ? ?12/28/21 LHC/RHC ?  Mid RCA lesion is 30% stenosed. ?  Dist RCA lesion is 50% stenosed. ?  1st Diag lesion is 90% stenosed. ?  Prox LAD to Mid LAD lesion is 95% stenosed. ?  A drug-eluting stent was successfully placed using a SYNERGY XD 3.0X16. ?  Balloon angioplasty was performed using a BALLN SAPPHIRE 2.0X12. ?  Post intervention, there is a 0% residual stenosis. ?  Post intervention, there is a 30% residual  stenosis. ?  ?Severe proximal LAD stenosis involving a moderate caliber diagonal branch.  ?Successful PTCA/DES x 1 proximal LAD ?Successful balloon angioplasty Diagonal 1 ?No obstructive disease in the Circumflex artery ?Moderate non-obstructive disease in the dominant RCA ?Elevated right and left heart pressures. ([place] 27)  ? ?01/25/2022  f/u ov/Mishel Sans re: cough variant asthma  maint on symbicort 160 /singulair   ?Chief Complaint  ?Patient presents with  ? Follow-up  ?  Patient is having shortness of breath and weakness over the last month. She was admitted into the hospital in March for heart attack. Productive/dry cough with yellow sputum. Patient currently on prednisone.  ? Dyspnea:  very sedentary since heart cath above  ?Cough: sporadic / rattles/ slt yellow mucus ?Sleeping: flat 2 pillows bed once a week needs at third pillow but no albuterol ?SABA use: until 01/24/22 did not have any / then neb added and took am 2 h prior to OV   ?02: none  ?Covid status:  vax x 2, never covid infection  ? ? ?No obvious patterns in  day to day or daytime variability or assoc   mucus plugs or hemoptysis or cp or chest tightness, subjective wheeze or overt sinus or hb symptoms.  ? ? Also denies any obvious fluctuation of symptoms with weather or environmental changes or other aggravating or alleviating factors except as outlined above  ? ?No unusual exposure hx or h/o childhood pna/ asthma or knowledge of premature birth. ? ?Current Allergies, Complete Past Medical History, Past Surgical History, Family History, and Social History were reviewed in [employer] record. ? ?ROS  The following are not active complaints unless bolded ?Hoarseness, sore throat, dysphagia, dental problems, itching, sneezing,  nasal congestion or discharge of excess mucus or purulent secretions, ear ache,   fever, chills, sweats, unintended wt loss or wt gain, classically pleuritic or exertional cp,  orthopnea pnd or arm/hand swelling  or leg swelling, presyncope, palpitations, abdominal pain, anorexia, nausea, vomiting, diarrhea  or change in bowel habits or change in bladder habits, change in stools or change in urine, dysuria, hematuria,  rash, arthralgias, visual complaints, headache, numbness, weakness or ataxia or problems with walking or coordination,  change in mood or  memory. ?      ? ?Current Meds  ?Medication Sig  ? acetaminophen (TYLENOL) 325 MG tablet Take 650 mg by mouth every 6 (six) hours as needed for pain.  ? albuterol (PROAIR HFA) 108 (90 Base) MCG/ACT inhaler Inhale 2 puffs into the lungs every 4 (four) hours as needed for wheezing.  ? albuterol (PROVENTIL) (2.5 MG/3ML) 0.083% nebulizer solution Take 3 mLs (2.5 mg total) by nebulization every 6 (six) hours as needed for wheezing or shortness of breath.  ? Ascorbic Acid (VITAMIN C) 1000 MG tablet Take 1,000 mg by mouth daily.  ? aspirin 81 MG EC tablet Take 1 tablet (81 mg total) by mouth daily. Swallow whole.  ? atorvastatin (LIPITOR) 80 MG tablet Take 1 tablet (80 mg total) by  mouth daily.  ? Blood Glucose Monitoring Suppl (ONETOUCH VERIO FLEX SYSTEM) w/Device KIT USE AS DIRECTED  ? budesonide-formoterol (SYMBICORT) 160-4.5 MCG/ACT inhaler TAKE 2 PUFFS FIRST THING IN AM AND THEN ANOTHER 2 PUFFS ABOUT 12 HOURS LATER.  ? carvedilol (COREG) 3.125 MG tablet Take 1 tablet (3.125 mg total) by mouth 2 (two) times daily with a meal.  ? cetirizine (ZYRTEC ALLERGY) 10 MG tablet Take 1 tablet (10 mg total) by mouth daily as needed for allergies.  ?  clopidogrel (PLAVIX) 75 MG tablet Take 1 tablet (75 mg total) by mouth daily with breakfast.  ? diclofenac Sodium (VOLTAREN) 1 % GEL Apply 4 g topically 4 (four) times daily as needed (pain).  ? ELIQUIS 2.5 MG TABS tablet TAKE 1 TABLET BY MOUTH TWICE A DAY  ? furosemide (LASIX) 40 MG tablet Take 1 tablet (40 mg total) by mouth 2 (two) times daily. (Patient taking differently: Take 40 mg by mouth daily.)  ? glucose blood (ONETOUCH VERIO) test strip 1 each by Other route 2 (two) times daily. And lancets 2/day  ? glucose blood test strip Check blood sugar twice a day  ? hydrALAZINE (APRESOLINE) 25 MG tablet TAKE 3 TABLETS (75 MG TOTAL) BY MOUTH 3 (THREE) TIMES DAILY.  ? insulin lispro (HUMALOG) 100 UNIT/ML injection Give 3 units with BREAKFAST, AND 5 units with SUPPER  ? latanoprost (XALATAN) 0.005 % ophthalmic solution Place 1 drop into both eyes at bedtime.  ? montelukast (SINGULAIR) 10 MG tablet TAKE 1 TABLET BY MOUTH EVERYDAY AT BEDTIME  ? nitroGLYCERIN (NITROSTAT) 0.4 MG SL tablet Place 1 tablet (0.4 mg total) under the tongue every 5 (five) minutes x 3 doses as needed for chest pain.  ? pantoprazole (PROTONIX) 40 MG tablet Take 1 tablet (40 mg total) by mouth 2 (two) times daily.  ? potassium chloride (KLOR-CON) 10 MEQ tablet TAKE 1 TABLET BY MOUTH EVERY DAY  ? predniSONE (DELTASONE) 20 MG tablet Take 2 tablets (40 mg total) by mouth daily for 4 days.  ? Syringe/Needle, Disp, (SYRINGE 3CC/25GX1") 25G X 1" 3 ML MISC Use to inject into the skin 2x a day   ? traMADol (ULTRAM) 50 MG tablet Take 50 mg by mouth every 6 (six) hours as needed for moderate pain.  ? ?Current Facility-Administered Medications for the 01/25/22 encounter (Office Visit) with Melvyn Novas, Dayna Ramus

## 2022-01-25 NOTE — Patient Instructions (Addendum)
Plan A = Automatic = Always=    Symbicort 160 (Breztri) Take 2 puffs first thing in am and then another 2 puffs about 12 hours later.  ?  ?Work on inhaler technique:  relax and gently blow all the way out then take a nice smooth full deep breath back in, triggering the inhaler at same time you start breathing in.  Hold for up to 5 seconds if you can. Blow out thru nose. Rinse and gargle with water when done.  If mouth or throat bother you at all,  try brushing teeth/gums/tongue with arm and hammer toothpaste/ make a slurry and gargle and spit out.  ? ?   ? ?Plan B = Backup (to supplement plan A, not to replace it) ?Only use your albuterol inhaler as a rescue medication to be used if you can't catch your breath by resting or doing a relaxed purse lip breathing pattern.  ?- The less you use it, the better it will work when you need it. ?- Ok to use the inhaler up to 2 puffs  every 4 hours if you must but call for appointment if use goes up over your usual need ?- Don't leave home without it !!  (think of it like the spare tire or starter for your car)  ? ?Plan C = Crisis (instead of Plan B but only if Plan B stops working) ?- only use your albuterol nebulizer if you first try Plan B and it fails to help > ok to use the nebulizer up to every 4 hours but if start needing it regularly call for immediate appointment ? ?At onset cough (if you are not already on Protonix which should be twice daily 30 min before meals) the 1st thing you should do :  Try prilosec otc 20mg   Take 30-60 min before first meal of the day and Pepcid ac (famotidine) 20 mg one @  bedtime until cough is completely gone   ? ?Zpak  ? ?Please remember to go to the  x-ray department  for your tests - we will call you with the results when they are available   ? ? ?Please schedule a follow up visit in 6  months but call sooner if needed  ? ? ?   ?

## 2022-01-30 ENCOUNTER — Telehealth: Payer: Self-pay | Admitting: Family

## 2022-01-30 ENCOUNTER — Ambulatory Visit (INDEPENDENT_AMBULATORY_CARE_PROVIDER_SITE_OTHER): Payer: Medicare Other

## 2022-01-30 DIAGNOSIS — I442 Atrioventricular block, complete: Secondary | ICD-10-CM | POA: Diagnosis not present

## 2022-01-30 DIAGNOSIS — M79642 Pain in left hand: Secondary | ICD-10-CM | POA: Diagnosis not present

## 2022-01-30 DIAGNOSIS — K219 Gastro-esophageal reflux disease without esophagitis: Secondary | ICD-10-CM | POA: Diagnosis not present

## 2022-01-30 DIAGNOSIS — N183 Chronic kidney disease, stage 3 unspecified: Secondary | ICD-10-CM | POA: Diagnosis not present

## 2022-01-30 DIAGNOSIS — M79641 Pain in right hand: Secondary | ICD-10-CM | POA: Diagnosis not present

## 2022-01-30 DIAGNOSIS — I5042 Chronic combined systolic (congestive) and diastolic (congestive) heart failure: Secondary | ICD-10-CM | POA: Diagnosis not present

## 2022-01-30 DIAGNOSIS — M1710 Unilateral primary osteoarthritis, unspecified knee: Secondary | ICD-10-CM | POA: Diagnosis not present

## 2022-01-30 DIAGNOSIS — I130 Hypertensive heart and chronic kidney disease with heart failure and stage 1 through stage 4 chronic kidney disease, or unspecified chronic kidney disease: Secondary | ICD-10-CM | POA: Diagnosis not present

## 2022-01-30 DIAGNOSIS — K5790 Diverticulosis of intestine, part unspecified, without perforation or abscess without bleeding: Secondary | ICD-10-CM | POA: Diagnosis not present

## 2022-01-30 DIAGNOSIS — J45991 Cough variant asthma: Secondary | ICD-10-CM | POA: Diagnosis not present

## 2022-01-30 DIAGNOSIS — D509 Iron deficiency anemia, unspecified: Secondary | ICD-10-CM | POA: Diagnosis not present

## 2022-01-30 DIAGNOSIS — I2510 Atherosclerotic heart disease of native coronary artery without angina pectoris: Secondary | ICD-10-CM | POA: Diagnosis not present

## 2022-01-30 DIAGNOSIS — M810 Age-related osteoporosis without current pathological fracture: Secondary | ICD-10-CM | POA: Diagnosis not present

## 2022-01-30 DIAGNOSIS — I252 Old myocardial infarction: Secondary | ICD-10-CM | POA: Diagnosis not present

## 2022-01-30 DIAGNOSIS — E785 Hyperlipidemia, unspecified: Secondary | ICD-10-CM | POA: Diagnosis not present

## 2022-01-30 DIAGNOSIS — H409 Unspecified glaucoma: Secondary | ICD-10-CM | POA: Diagnosis not present

## 2022-01-30 DIAGNOSIS — D631 Anemia in chronic kidney disease: Secondary | ICD-10-CM | POA: Diagnosis not present

## 2022-01-30 DIAGNOSIS — I4891 Unspecified atrial fibrillation: Secondary | ICD-10-CM | POA: Diagnosis not present

## 2022-01-30 DIAGNOSIS — K589 Irritable bowel syndrome without diarrhea: Secondary | ICD-10-CM | POA: Diagnosis not present

## 2022-01-30 DIAGNOSIS — E559 Vitamin D deficiency, unspecified: Secondary | ICD-10-CM | POA: Diagnosis not present

## 2022-01-30 DIAGNOSIS — K649 Unspecified hemorrhoids: Secondary | ICD-10-CM | POA: Diagnosis not present

## 2022-01-30 DIAGNOSIS — J219 Acute bronchiolitis, unspecified: Secondary | ICD-10-CM | POA: Diagnosis not present

## 2022-01-30 DIAGNOSIS — E1122 Type 2 diabetes mellitus with diabetic chronic kidney disease: Secondary | ICD-10-CM | POA: Diagnosis not present

## 2022-01-30 LAB — CUP PACEART REMOTE DEVICE CHECK
Battery Remaining Longevity: 146 mo
Battery Voltage: 3.03 V
Brady Statistic RV Percent Paced: 98.83 %
Date Time Interrogation Session: 20230501022310
Implantable Lead Implant Date: 20211101
Implantable Lead Location: 753860
Implantable Lead Model: 3830
Implantable Pulse Generator Implant Date: 20211101
Lead Channel Impedance Value: 361 Ohm
Lead Channel Impedance Value: 475 Ohm
Lead Channel Pacing Threshold Amplitude: 0.75 V
Lead Channel Pacing Threshold Pulse Width: 0.4 ms
Lead Channel Sensing Intrinsic Amplitude: 6.25 mV
Lead Channel Sensing Intrinsic Amplitude: 6.25 mV
Lead Channel Setting Pacing Amplitude: 2 V
Lead Channel Setting Pacing Pulse Width: 0.4 ms
Lead Channel Setting Sensing Sensitivity: 0.9 mV

## 2022-01-30 NOTE — Telephone Encounter (Signed)
Caller/Agency: [facility] ([facility]) ?Callback Number: [phone] ?Requesting OT/PT/Skilled Nursing/Social Work/Speech Therapy: [facility] ?Frequency: 1 w 3, 1 every other week for 6 ? ?

## 2022-01-31 ENCOUNTER — Telehealth: Payer: Self-pay | Admitting: Family

## 2022-01-31 DIAGNOSIS — I4891 Unspecified atrial fibrillation: Secondary | ICD-10-CM | POA: Diagnosis not present

## 2022-01-31 DIAGNOSIS — K649 Unspecified hemorrhoids: Secondary | ICD-10-CM | POA: Diagnosis not present

## 2022-01-31 DIAGNOSIS — M79641 Pain in right hand: Secondary | ICD-10-CM | POA: Diagnosis not present

## 2022-01-31 DIAGNOSIS — E785 Hyperlipidemia, unspecified: Secondary | ICD-10-CM | POA: Diagnosis not present

## 2022-01-31 DIAGNOSIS — M810 Age-related osteoporosis without current pathological fracture: Secondary | ICD-10-CM | POA: Diagnosis not present

## 2022-01-31 DIAGNOSIS — I5042 Chronic combined systolic (congestive) and diastolic (congestive) heart failure: Secondary | ICD-10-CM | POA: Diagnosis not present

## 2022-01-31 DIAGNOSIS — I442 Atrioventricular block, complete: Secondary | ICD-10-CM | POA: Diagnosis not present

## 2022-01-31 DIAGNOSIS — D631 Anemia in chronic kidney disease: Secondary | ICD-10-CM | POA: Diagnosis not present

## 2022-01-31 DIAGNOSIS — D509 Iron deficiency anemia, unspecified: Secondary | ICD-10-CM | POA: Diagnosis not present

## 2022-01-31 DIAGNOSIS — N183 Chronic kidney disease, stage 3 unspecified: Secondary | ICD-10-CM | POA: Diagnosis not present

## 2022-01-31 DIAGNOSIS — H409 Unspecified glaucoma: Secondary | ICD-10-CM | POA: Diagnosis not present

## 2022-01-31 DIAGNOSIS — E559 Vitamin D deficiency, unspecified: Secondary | ICD-10-CM | POA: Diagnosis not present

## 2022-01-31 DIAGNOSIS — J219 Acute bronchiolitis, unspecified: Secondary | ICD-10-CM | POA: Diagnosis not present

## 2022-01-31 DIAGNOSIS — E1122 Type 2 diabetes mellitus with diabetic chronic kidney disease: Secondary | ICD-10-CM | POA: Diagnosis not present

## 2022-01-31 DIAGNOSIS — K219 Gastro-esophageal reflux disease without esophagitis: Secondary | ICD-10-CM | POA: Diagnosis not present

## 2022-01-31 DIAGNOSIS — J45991 Cough variant asthma: Secondary | ICD-10-CM | POA: Diagnosis not present

## 2022-01-31 DIAGNOSIS — M1710 Unilateral primary osteoarthritis, unspecified knee: Secondary | ICD-10-CM | POA: Diagnosis not present

## 2022-01-31 DIAGNOSIS — M79642 Pain in left hand: Secondary | ICD-10-CM | POA: Diagnosis not present

## 2022-01-31 DIAGNOSIS — K589 Irritable bowel syndrome without diarrhea: Secondary | ICD-10-CM | POA: Diagnosis not present

## 2022-01-31 DIAGNOSIS — I252 Old myocardial infarction: Secondary | ICD-10-CM | POA: Diagnosis not present

## 2022-01-31 DIAGNOSIS — K5790 Diverticulosis of intestine, part unspecified, without perforation or abscess without bleeding: Secondary | ICD-10-CM | POA: Diagnosis not present

## 2022-01-31 DIAGNOSIS — I130 Hypertensive heart and chronic kidney disease with heart failure and stage 1 through stage 4 chronic kidney disease, or unspecified chronic kidney disease: Secondary | ICD-10-CM | POA: Diagnosis not present

## 2022-01-31 DIAGNOSIS — I2510 Atherosclerotic heart disease of native coronary artery without angina pectoris: Secondary | ICD-10-CM | POA: Diagnosis not present

## 2022-01-31 NOTE — Telephone Encounter (Signed)
Verbal orders given  

## 2022-01-31 NOTE — Telephone Encounter (Signed)
Caller/Agency: [facility] ?Callback Number: [phone] ?Requesting OT/PT/Skilled Nursing/Social Work/Speech Therapy: PT ?Frequency: 1 w 1, 2 w 6, 1 w 2 ?

## 2022-02-02 DIAGNOSIS — H401131 Primary open-angle glaucoma, bilateral, mild stage: Secondary | ICD-10-CM | POA: Diagnosis not present

## 2022-02-06 DIAGNOSIS — J45991 Cough variant asthma: Secondary | ICD-10-CM | POA: Diagnosis not present

## 2022-02-06 DIAGNOSIS — I130 Hypertensive heart and chronic kidney disease with heart failure and stage 1 through stage 4 chronic kidney disease, or unspecified chronic kidney disease: Secondary | ICD-10-CM | POA: Diagnosis not present

## 2022-02-06 DIAGNOSIS — D631 Anemia in chronic kidney disease: Secondary | ICD-10-CM | POA: Diagnosis not present

## 2022-02-06 DIAGNOSIS — I5042 Chronic combined systolic (congestive) and diastolic (congestive) heart failure: Secondary | ICD-10-CM | POA: Diagnosis not present

## 2022-02-06 DIAGNOSIS — K219 Gastro-esophageal reflux disease without esophagitis: Secondary | ICD-10-CM | POA: Diagnosis not present

## 2022-02-06 DIAGNOSIS — M810 Age-related osteoporosis without current pathological fracture: Secondary | ICD-10-CM | POA: Diagnosis not present

## 2022-02-06 DIAGNOSIS — E559 Vitamin D deficiency, unspecified: Secondary | ICD-10-CM | POA: Diagnosis not present

## 2022-02-06 DIAGNOSIS — D509 Iron deficiency anemia, unspecified: Secondary | ICD-10-CM | POA: Diagnosis not present

## 2022-02-06 DIAGNOSIS — M79642 Pain in left hand: Secondary | ICD-10-CM | POA: Diagnosis not present

## 2022-02-06 DIAGNOSIS — M1710 Unilateral primary osteoarthritis, unspecified knee: Secondary | ICD-10-CM | POA: Diagnosis not present

## 2022-02-06 DIAGNOSIS — I4891 Unspecified atrial fibrillation: Secondary | ICD-10-CM | POA: Diagnosis not present

## 2022-02-06 DIAGNOSIS — M79641 Pain in right hand: Secondary | ICD-10-CM | POA: Diagnosis not present

## 2022-02-06 DIAGNOSIS — H409 Unspecified glaucoma: Secondary | ICD-10-CM | POA: Diagnosis not present

## 2022-02-06 DIAGNOSIS — K649 Unspecified hemorrhoids: Secondary | ICD-10-CM | POA: Diagnosis not present

## 2022-02-06 DIAGNOSIS — J219 Acute bronchiolitis, unspecified: Secondary | ICD-10-CM | POA: Diagnosis not present

## 2022-02-06 DIAGNOSIS — E1122 Type 2 diabetes mellitus with diabetic chronic kidney disease: Secondary | ICD-10-CM | POA: Diagnosis not present

## 2022-02-06 DIAGNOSIS — K5790 Diverticulosis of intestine, part unspecified, without perforation or abscess without bleeding: Secondary | ICD-10-CM | POA: Diagnosis not present

## 2022-02-06 DIAGNOSIS — I252 Old myocardial infarction: Secondary | ICD-10-CM | POA: Diagnosis not present

## 2022-02-06 DIAGNOSIS — K589 Irritable bowel syndrome without diarrhea: Secondary | ICD-10-CM | POA: Diagnosis not present

## 2022-02-06 DIAGNOSIS — I2510 Atherosclerotic heart disease of native coronary artery without angina pectoris: Secondary | ICD-10-CM | POA: Diagnosis not present

## 2022-02-06 DIAGNOSIS — E785 Hyperlipidemia, unspecified: Secondary | ICD-10-CM | POA: Diagnosis not present

## 2022-02-06 DIAGNOSIS — N183 Chronic kidney disease, stage 3 unspecified: Secondary | ICD-10-CM | POA: Diagnosis not present

## 2022-02-06 DIAGNOSIS — I442 Atrioventricular block, complete: Secondary | ICD-10-CM | POA: Diagnosis not present

## 2022-02-07 ENCOUNTER — Telehealth: Payer: Self-pay | Admitting: Family

## 2022-02-07 NOTE — Telephone Encounter (Signed)
Report given to provider verbally.  ?

## 2022-02-07 NOTE — Telephone Encounter (Signed)
[facility]) called to report missed visit. Pt stated that she had a time conflict and wanted to resume her second visit on 5.12.23. ?

## 2022-02-10 DIAGNOSIS — I5042 Chronic combined systolic (congestive) and diastolic (congestive) heart failure: Secondary | ICD-10-CM | POA: Diagnosis not present

## 2022-02-10 DIAGNOSIS — D631 Anemia in chronic kidney disease: Secondary | ICD-10-CM | POA: Diagnosis not present

## 2022-02-10 DIAGNOSIS — J45991 Cough variant asthma: Secondary | ICD-10-CM | POA: Diagnosis not present

## 2022-02-10 DIAGNOSIS — M79642 Pain in left hand: Secondary | ICD-10-CM | POA: Diagnosis not present

## 2022-02-10 DIAGNOSIS — M810 Age-related osteoporosis without current pathological fracture: Secondary | ICD-10-CM | POA: Diagnosis not present

## 2022-02-10 DIAGNOSIS — K219 Gastro-esophageal reflux disease without esophagitis: Secondary | ICD-10-CM | POA: Diagnosis not present

## 2022-02-10 DIAGNOSIS — E559 Vitamin D deficiency, unspecified: Secondary | ICD-10-CM | POA: Diagnosis not present

## 2022-02-10 DIAGNOSIS — N183 Chronic kidney disease, stage 3 unspecified: Secondary | ICD-10-CM | POA: Diagnosis not present

## 2022-02-10 DIAGNOSIS — I4891 Unspecified atrial fibrillation: Secondary | ICD-10-CM | POA: Diagnosis not present

## 2022-02-10 DIAGNOSIS — M79641 Pain in right hand: Secondary | ICD-10-CM | POA: Diagnosis not present

## 2022-02-10 DIAGNOSIS — I130 Hypertensive heart and chronic kidney disease with heart failure and stage 1 through stage 4 chronic kidney disease, or unspecified chronic kidney disease: Secondary | ICD-10-CM | POA: Diagnosis not present

## 2022-02-10 DIAGNOSIS — K589 Irritable bowel syndrome without diarrhea: Secondary | ICD-10-CM | POA: Diagnosis not present

## 2022-02-10 DIAGNOSIS — E785 Hyperlipidemia, unspecified: Secondary | ICD-10-CM | POA: Diagnosis not present

## 2022-02-10 DIAGNOSIS — K649 Unspecified hemorrhoids: Secondary | ICD-10-CM | POA: Diagnosis not present

## 2022-02-10 DIAGNOSIS — I252 Old myocardial infarction: Secondary | ICD-10-CM | POA: Diagnosis not present

## 2022-02-10 DIAGNOSIS — J219 Acute bronchiolitis, unspecified: Secondary | ICD-10-CM | POA: Diagnosis not present

## 2022-02-10 DIAGNOSIS — I2510 Atherosclerotic heart disease of native coronary artery without angina pectoris: Secondary | ICD-10-CM | POA: Diagnosis not present

## 2022-02-10 DIAGNOSIS — K5790 Diverticulosis of intestine, part unspecified, without perforation or abscess without bleeding: Secondary | ICD-10-CM | POA: Diagnosis not present

## 2022-02-10 DIAGNOSIS — H409 Unspecified glaucoma: Secondary | ICD-10-CM | POA: Diagnosis not present

## 2022-02-10 DIAGNOSIS — M1710 Unilateral primary osteoarthritis, unspecified knee: Secondary | ICD-10-CM | POA: Diagnosis not present

## 2022-02-10 DIAGNOSIS — E1122 Type 2 diabetes mellitus with diabetic chronic kidney disease: Secondary | ICD-10-CM | POA: Diagnosis not present

## 2022-02-10 DIAGNOSIS — D509 Iron deficiency anemia, unspecified: Secondary | ICD-10-CM | POA: Diagnosis not present

## 2022-02-10 DIAGNOSIS — I442 Atrioventricular block, complete: Secondary | ICD-10-CM | POA: Diagnosis not present

## 2022-02-14 DIAGNOSIS — E1122 Type 2 diabetes mellitus with diabetic chronic kidney disease: Secondary | ICD-10-CM | POA: Diagnosis not present

## 2022-02-14 DIAGNOSIS — K219 Gastro-esophageal reflux disease without esophagitis: Secondary | ICD-10-CM | POA: Diagnosis not present

## 2022-02-14 DIAGNOSIS — M1710 Unilateral primary osteoarthritis, unspecified knee: Secondary | ICD-10-CM | POA: Diagnosis not present

## 2022-02-14 DIAGNOSIS — J219 Acute bronchiolitis, unspecified: Secondary | ICD-10-CM | POA: Diagnosis not present

## 2022-02-14 DIAGNOSIS — E559 Vitamin D deficiency, unspecified: Secondary | ICD-10-CM | POA: Diagnosis not present

## 2022-02-14 DIAGNOSIS — M79641 Pain in right hand: Secondary | ICD-10-CM | POA: Diagnosis not present

## 2022-02-14 DIAGNOSIS — I442 Atrioventricular block, complete: Secondary | ICD-10-CM | POA: Diagnosis not present

## 2022-02-14 DIAGNOSIS — H409 Unspecified glaucoma: Secondary | ICD-10-CM | POA: Diagnosis not present

## 2022-02-14 DIAGNOSIS — I2510 Atherosclerotic heart disease of native coronary artery without angina pectoris: Secondary | ICD-10-CM | POA: Diagnosis not present

## 2022-02-14 DIAGNOSIS — K5790 Diverticulosis of intestine, part unspecified, without perforation or abscess without bleeding: Secondary | ICD-10-CM | POA: Diagnosis not present

## 2022-02-14 DIAGNOSIS — K649 Unspecified hemorrhoids: Secondary | ICD-10-CM | POA: Diagnosis not present

## 2022-02-14 DIAGNOSIS — M79642 Pain in left hand: Secondary | ICD-10-CM | POA: Diagnosis not present

## 2022-02-14 DIAGNOSIS — D631 Anemia in chronic kidney disease: Secondary | ICD-10-CM | POA: Diagnosis not present

## 2022-02-14 DIAGNOSIS — M810 Age-related osteoporosis without current pathological fracture: Secondary | ICD-10-CM | POA: Diagnosis not present

## 2022-02-14 DIAGNOSIS — I252 Old myocardial infarction: Secondary | ICD-10-CM | POA: Diagnosis not present

## 2022-02-14 DIAGNOSIS — D509 Iron deficiency anemia, unspecified: Secondary | ICD-10-CM | POA: Diagnosis not present

## 2022-02-14 DIAGNOSIS — K589 Irritable bowel syndrome without diarrhea: Secondary | ICD-10-CM | POA: Diagnosis not present

## 2022-02-14 DIAGNOSIS — I130 Hypertensive heart and chronic kidney disease with heart failure and stage 1 through stage 4 chronic kidney disease, or unspecified chronic kidney disease: Secondary | ICD-10-CM | POA: Diagnosis not present

## 2022-02-14 DIAGNOSIS — I5042 Chronic combined systolic (congestive) and diastolic (congestive) heart failure: Secondary | ICD-10-CM | POA: Diagnosis not present

## 2022-02-14 DIAGNOSIS — E785 Hyperlipidemia, unspecified: Secondary | ICD-10-CM | POA: Diagnosis not present

## 2022-02-14 DIAGNOSIS — N183 Chronic kidney disease, stage 3 unspecified: Secondary | ICD-10-CM | POA: Diagnosis not present

## 2022-02-14 DIAGNOSIS — I4891 Unspecified atrial fibrillation: Secondary | ICD-10-CM | POA: Diagnosis not present

## 2022-02-14 DIAGNOSIS — J45991 Cough variant asthma: Secondary | ICD-10-CM | POA: Diagnosis not present

## 2022-02-14 NOTE — Progress Notes (Signed)
Remote pacemaker transmission.   

## 2022-02-16 DIAGNOSIS — E1122 Type 2 diabetes mellitus with diabetic chronic kidney disease: Secondary | ICD-10-CM | POA: Diagnosis not present

## 2022-02-16 DIAGNOSIS — N184 Chronic kidney disease, stage 4 (severe): Secondary | ICD-10-CM | POA: Diagnosis not present

## 2022-02-16 DIAGNOSIS — I4891 Unspecified atrial fibrillation: Secondary | ICD-10-CM | POA: Diagnosis not present

## 2022-02-16 DIAGNOSIS — D509 Iron deficiency anemia, unspecified: Secondary | ICD-10-CM | POA: Diagnosis not present

## 2022-02-16 DIAGNOSIS — I129 Hypertensive chronic kidney disease with stage 1 through stage 4 chronic kidney disease, or unspecified chronic kidney disease: Secondary | ICD-10-CM | POA: Diagnosis not present

## 2022-02-16 DIAGNOSIS — E785 Hyperlipidemia, unspecified: Secondary | ICD-10-CM | POA: Diagnosis not present

## 2022-02-16 DIAGNOSIS — I5021 Acute systolic (congestive) heart failure: Secondary | ICD-10-CM | POA: Diagnosis not present

## 2022-02-16 DIAGNOSIS — N179 Acute kidney failure, unspecified: Secondary | ICD-10-CM | POA: Diagnosis not present

## 2022-02-17 DIAGNOSIS — M79641 Pain in right hand: Secondary | ICD-10-CM | POA: Diagnosis not present

## 2022-02-17 DIAGNOSIS — D631 Anemia in chronic kidney disease: Secondary | ICD-10-CM | POA: Diagnosis not present

## 2022-02-17 DIAGNOSIS — I4891 Unspecified atrial fibrillation: Secondary | ICD-10-CM | POA: Diagnosis not present

## 2022-02-17 DIAGNOSIS — E1122 Type 2 diabetes mellitus with diabetic chronic kidney disease: Secondary | ICD-10-CM | POA: Diagnosis not present

## 2022-02-17 DIAGNOSIS — I252 Old myocardial infarction: Secondary | ICD-10-CM | POA: Diagnosis not present

## 2022-02-17 DIAGNOSIS — H409 Unspecified glaucoma: Secondary | ICD-10-CM | POA: Diagnosis not present

## 2022-02-17 DIAGNOSIS — N183 Chronic kidney disease, stage 3 unspecified: Secondary | ICD-10-CM | POA: Diagnosis not present

## 2022-02-17 DIAGNOSIS — K219 Gastro-esophageal reflux disease without esophagitis: Secondary | ICD-10-CM | POA: Diagnosis not present

## 2022-02-17 DIAGNOSIS — K589 Irritable bowel syndrome without diarrhea: Secondary | ICD-10-CM | POA: Diagnosis not present

## 2022-02-17 DIAGNOSIS — I442 Atrioventricular block, complete: Secondary | ICD-10-CM | POA: Diagnosis not present

## 2022-02-17 DIAGNOSIS — M810 Age-related osteoporosis without current pathological fracture: Secondary | ICD-10-CM | POA: Diagnosis not present

## 2022-02-17 DIAGNOSIS — J45991 Cough variant asthma: Secondary | ICD-10-CM | POA: Diagnosis not present

## 2022-02-17 DIAGNOSIS — M79642 Pain in left hand: Secondary | ICD-10-CM | POA: Diagnosis not present

## 2022-02-17 DIAGNOSIS — E785 Hyperlipidemia, unspecified: Secondary | ICD-10-CM | POA: Diagnosis not present

## 2022-02-17 DIAGNOSIS — D509 Iron deficiency anemia, unspecified: Secondary | ICD-10-CM | POA: Diagnosis not present

## 2022-02-17 DIAGNOSIS — J219 Acute bronchiolitis, unspecified: Secondary | ICD-10-CM | POA: Diagnosis not present

## 2022-02-17 DIAGNOSIS — I2510 Atherosclerotic heart disease of native coronary artery without angina pectoris: Secondary | ICD-10-CM | POA: Diagnosis not present

## 2022-02-17 DIAGNOSIS — M1710 Unilateral primary osteoarthritis, unspecified knee: Secondary | ICD-10-CM | POA: Diagnosis not present

## 2022-02-17 DIAGNOSIS — E559 Vitamin D deficiency, unspecified: Secondary | ICD-10-CM | POA: Diagnosis not present

## 2022-02-17 DIAGNOSIS — K649 Unspecified hemorrhoids: Secondary | ICD-10-CM | POA: Diagnosis not present

## 2022-02-17 DIAGNOSIS — I130 Hypertensive heart and chronic kidney disease with heart failure and stage 1 through stage 4 chronic kidney disease, or unspecified chronic kidney disease: Secondary | ICD-10-CM | POA: Diagnosis not present

## 2022-02-17 DIAGNOSIS — K5790 Diverticulosis of intestine, part unspecified, without perforation or abscess without bleeding: Secondary | ICD-10-CM | POA: Diagnosis not present

## 2022-02-17 DIAGNOSIS — I5042 Chronic combined systolic (congestive) and diastolic (congestive) heart failure: Secondary | ICD-10-CM | POA: Diagnosis not present

## 2022-02-20 DIAGNOSIS — I5042 Chronic combined systolic (congestive) and diastolic (congestive) heart failure: Secondary | ICD-10-CM | POA: Diagnosis not present

## 2022-02-20 DIAGNOSIS — N183 Chronic kidney disease, stage 3 unspecified: Secondary | ICD-10-CM | POA: Diagnosis not present

## 2022-02-20 DIAGNOSIS — F411 Generalized anxiety disorder: Secondary | ICD-10-CM

## 2022-02-20 DIAGNOSIS — Z7951 Long term (current) use of inhaled steroids: Secondary | ICD-10-CM

## 2022-02-20 DIAGNOSIS — D509 Iron deficiency anemia, unspecified: Secondary | ICD-10-CM | POA: Diagnosis not present

## 2022-02-20 DIAGNOSIS — I130 Hypertensive heart and chronic kidney disease with heart failure and stage 1 through stage 4 chronic kidney disease, or unspecified chronic kidney disease: Secondary | ICD-10-CM | POA: Diagnosis not present

## 2022-02-20 DIAGNOSIS — K5790 Diverticulosis of intestine, part unspecified, without perforation or abscess without bleeding: Secondary | ICD-10-CM

## 2022-02-20 DIAGNOSIS — E785 Hyperlipidemia, unspecified: Secondary | ICD-10-CM

## 2022-02-20 DIAGNOSIS — J219 Acute bronchiolitis, unspecified: Secondary | ICD-10-CM

## 2022-02-20 DIAGNOSIS — Z7901 Long term (current) use of anticoagulants: Secondary | ICD-10-CM

## 2022-02-20 DIAGNOSIS — M79641 Pain in right hand: Secondary | ICD-10-CM | POA: Diagnosis not present

## 2022-02-20 DIAGNOSIS — Z87891 Personal history of nicotine dependence: Secondary | ICD-10-CM

## 2022-02-20 DIAGNOSIS — K219 Gastro-esophageal reflux disease without esophagitis: Secondary | ICD-10-CM

## 2022-02-20 DIAGNOSIS — E1122 Type 2 diabetes mellitus with diabetic chronic kidney disease: Secondary | ICD-10-CM | POA: Diagnosis not present

## 2022-02-20 DIAGNOSIS — I252 Old myocardial infarction: Secondary | ICD-10-CM | POA: Diagnosis not present

## 2022-02-20 DIAGNOSIS — K589 Irritable bowel syndrome without diarrhea: Secondary | ICD-10-CM

## 2022-02-20 DIAGNOSIS — N179 Acute kidney failure, unspecified: Secondary | ICD-10-CM

## 2022-02-20 DIAGNOSIS — M810 Age-related osteoporosis without current pathological fracture: Secondary | ICD-10-CM

## 2022-02-20 DIAGNOSIS — Z7902 Long term (current) use of antithrombotics/antiplatelets: Secondary | ICD-10-CM

## 2022-02-20 DIAGNOSIS — I4891 Unspecified atrial fibrillation: Secondary | ICD-10-CM | POA: Diagnosis not present

## 2022-02-20 DIAGNOSIS — I2510 Atherosclerotic heart disease of native coronary artery without angina pectoris: Secondary | ICD-10-CM | POA: Diagnosis not present

## 2022-02-20 DIAGNOSIS — E559 Vitamin D deficiency, unspecified: Secondary | ICD-10-CM

## 2022-02-20 DIAGNOSIS — I442 Atrioventricular block, complete: Secondary | ICD-10-CM

## 2022-02-20 DIAGNOSIS — J45991 Cough variant asthma: Secondary | ICD-10-CM | POA: Diagnosis not present

## 2022-02-20 DIAGNOSIS — Z6825 Body mass index (BMI) 25.0-25.9, adult: Secondary | ICD-10-CM

## 2022-02-20 DIAGNOSIS — M1710 Unilateral primary osteoarthritis, unspecified knee: Secondary | ICD-10-CM

## 2022-02-20 DIAGNOSIS — D631 Anemia in chronic kidney disease: Secondary | ICD-10-CM | POA: Diagnosis not present

## 2022-02-20 DIAGNOSIS — H409 Unspecified glaucoma: Secondary | ICD-10-CM

## 2022-02-20 DIAGNOSIS — Z87440 Personal history of urinary (tract) infections: Secondary | ICD-10-CM

## 2022-02-20 DIAGNOSIS — Z86010 Personal history of colonic polyps: Secondary | ICD-10-CM

## 2022-02-20 DIAGNOSIS — M79642 Pain in left hand: Secondary | ICD-10-CM

## 2022-02-20 DIAGNOSIS — Z950 Presence of cardiac pacemaker: Secondary | ICD-10-CM

## 2022-02-20 DIAGNOSIS — K649 Unspecified hemorrhoids: Secondary | ICD-10-CM

## 2022-02-21 DIAGNOSIS — K5790 Diverticulosis of intestine, part unspecified, without perforation or abscess without bleeding: Secondary | ICD-10-CM | POA: Diagnosis not present

## 2022-02-21 DIAGNOSIS — J219 Acute bronchiolitis, unspecified: Secondary | ICD-10-CM | POA: Diagnosis not present

## 2022-02-21 DIAGNOSIS — I442 Atrioventricular block, complete: Secondary | ICD-10-CM | POA: Diagnosis not present

## 2022-02-21 DIAGNOSIS — J45991 Cough variant asthma: Secondary | ICD-10-CM | POA: Diagnosis not present

## 2022-02-21 DIAGNOSIS — M79642 Pain in left hand: Secondary | ICD-10-CM | POA: Diagnosis not present

## 2022-02-21 DIAGNOSIS — I252 Old myocardial infarction: Secondary | ICD-10-CM | POA: Diagnosis not present

## 2022-02-21 DIAGNOSIS — M79641 Pain in right hand: Secondary | ICD-10-CM | POA: Diagnosis not present

## 2022-02-21 DIAGNOSIS — K649 Unspecified hemorrhoids: Secondary | ICD-10-CM | POA: Diagnosis not present

## 2022-02-21 DIAGNOSIS — K589 Irritable bowel syndrome without diarrhea: Secondary | ICD-10-CM | POA: Diagnosis not present

## 2022-02-21 DIAGNOSIS — M810 Age-related osteoporosis without current pathological fracture: Secondary | ICD-10-CM | POA: Diagnosis not present

## 2022-02-21 DIAGNOSIS — I5042 Chronic combined systolic (congestive) and diastolic (congestive) heart failure: Secondary | ICD-10-CM | POA: Diagnosis not present

## 2022-02-21 DIAGNOSIS — I130 Hypertensive heart and chronic kidney disease with heart failure and stage 1 through stage 4 chronic kidney disease, or unspecified chronic kidney disease: Secondary | ICD-10-CM | POA: Diagnosis not present

## 2022-02-21 DIAGNOSIS — M1710 Unilateral primary osteoarthritis, unspecified knee: Secondary | ICD-10-CM | POA: Diagnosis not present

## 2022-02-21 DIAGNOSIS — I4891 Unspecified atrial fibrillation: Secondary | ICD-10-CM | POA: Diagnosis not present

## 2022-02-21 DIAGNOSIS — E785 Hyperlipidemia, unspecified: Secondary | ICD-10-CM | POA: Diagnosis not present

## 2022-02-21 DIAGNOSIS — I2510 Atherosclerotic heart disease of native coronary artery without angina pectoris: Secondary | ICD-10-CM | POA: Diagnosis not present

## 2022-02-21 DIAGNOSIS — E559 Vitamin D deficiency, unspecified: Secondary | ICD-10-CM | POA: Diagnosis not present

## 2022-02-21 DIAGNOSIS — D631 Anemia in chronic kidney disease: Secondary | ICD-10-CM | POA: Diagnosis not present

## 2022-02-21 DIAGNOSIS — E1122 Type 2 diabetes mellitus with diabetic chronic kidney disease: Secondary | ICD-10-CM | POA: Diagnosis not present

## 2022-02-21 DIAGNOSIS — K219 Gastro-esophageal reflux disease without esophagitis: Secondary | ICD-10-CM | POA: Diagnosis not present

## 2022-02-21 DIAGNOSIS — D509 Iron deficiency anemia, unspecified: Secondary | ICD-10-CM | POA: Diagnosis not present

## 2022-02-21 DIAGNOSIS — H409 Unspecified glaucoma: Secondary | ICD-10-CM | POA: Diagnosis not present

## 2022-02-21 DIAGNOSIS — N183 Chronic kidney disease, stage 3 unspecified: Secondary | ICD-10-CM | POA: Diagnosis not present

## 2022-02-23 DIAGNOSIS — K589 Irritable bowel syndrome without diarrhea: Secondary | ICD-10-CM | POA: Diagnosis not present

## 2022-02-23 DIAGNOSIS — D631 Anemia in chronic kidney disease: Secondary | ICD-10-CM | POA: Diagnosis not present

## 2022-02-23 DIAGNOSIS — M79641 Pain in right hand: Secondary | ICD-10-CM | POA: Diagnosis not present

## 2022-02-23 DIAGNOSIS — J45991 Cough variant asthma: Secondary | ICD-10-CM | POA: Diagnosis not present

## 2022-02-23 DIAGNOSIS — E559 Vitamin D deficiency, unspecified: Secondary | ICD-10-CM | POA: Diagnosis not present

## 2022-02-23 DIAGNOSIS — M1710 Unilateral primary osteoarthritis, unspecified knee: Secondary | ICD-10-CM | POA: Diagnosis not present

## 2022-02-23 DIAGNOSIS — D509 Iron deficiency anemia, unspecified: Secondary | ICD-10-CM | POA: Diagnosis not present

## 2022-02-23 DIAGNOSIS — I5042 Chronic combined systolic (congestive) and diastolic (congestive) heart failure: Secondary | ICD-10-CM | POA: Diagnosis not present

## 2022-02-23 DIAGNOSIS — M810 Age-related osteoporosis without current pathological fracture: Secondary | ICD-10-CM | POA: Diagnosis not present

## 2022-02-23 DIAGNOSIS — I130 Hypertensive heart and chronic kidney disease with heart failure and stage 1 through stage 4 chronic kidney disease, or unspecified chronic kidney disease: Secondary | ICD-10-CM | POA: Diagnosis not present

## 2022-02-23 DIAGNOSIS — E785 Hyperlipidemia, unspecified: Secondary | ICD-10-CM | POA: Diagnosis not present

## 2022-02-23 DIAGNOSIS — E1122 Type 2 diabetes mellitus with diabetic chronic kidney disease: Secondary | ICD-10-CM | POA: Diagnosis not present

## 2022-02-23 DIAGNOSIS — I252 Old myocardial infarction: Secondary | ICD-10-CM | POA: Diagnosis not present

## 2022-02-23 DIAGNOSIS — M79642 Pain in left hand: Secondary | ICD-10-CM | POA: Diagnosis not present

## 2022-02-23 DIAGNOSIS — K219 Gastro-esophageal reflux disease without esophagitis: Secondary | ICD-10-CM | POA: Diagnosis not present

## 2022-02-23 DIAGNOSIS — K5790 Diverticulosis of intestine, part unspecified, without perforation or abscess without bleeding: Secondary | ICD-10-CM | POA: Diagnosis not present

## 2022-02-23 DIAGNOSIS — J219 Acute bronchiolitis, unspecified: Secondary | ICD-10-CM | POA: Diagnosis not present

## 2022-02-23 DIAGNOSIS — I442 Atrioventricular block, complete: Secondary | ICD-10-CM | POA: Diagnosis not present

## 2022-02-23 DIAGNOSIS — H409 Unspecified glaucoma: Secondary | ICD-10-CM | POA: Diagnosis not present

## 2022-02-23 DIAGNOSIS — K649 Unspecified hemorrhoids: Secondary | ICD-10-CM | POA: Diagnosis not present

## 2022-02-23 DIAGNOSIS — I4891 Unspecified atrial fibrillation: Secondary | ICD-10-CM | POA: Diagnosis not present

## 2022-02-23 DIAGNOSIS — I2510 Atherosclerotic heart disease of native coronary artery without angina pectoris: Secondary | ICD-10-CM | POA: Diagnosis not present

## 2022-02-23 DIAGNOSIS — N183 Chronic kidney disease, stage 3 unspecified: Secondary | ICD-10-CM | POA: Diagnosis not present

## 2022-02-24 ENCOUNTER — Telehealth: Payer: Self-pay

## 2022-02-24 ENCOUNTER — Telehealth: Payer: Self-pay | Admitting: Family

## 2022-02-24 MED ORDER — FAMOTIDINE 20 MG PO TABS: 20.0000 mg | ORAL_TABLET | Freq: Two times a day (BID) | ORAL | 0 refills | Status: DC

## 2022-02-24 NOTE — Telephone Encounter (Signed)
Called pt left Lm to call office back

## 2022-02-24 NOTE — Addendum Note (Signed)
Addended by: Sherlene Shams on: 02/24/2022 05:32 PM   Modules accepted: Orders

## 2022-02-24 NOTE — Telephone Encounter (Signed)
Spoke with patient; she notes her back has "been itching" in different places for most of the week; denies any rash; while on phone call, she has her daughter verify that no rash is present; Has been using Benadryl and Zyrtec with some benefit; will try adding Pepcid to this regimen and she understands she will need to be seen if symptoms persist or rash develops.

## 2022-02-24 NOTE — Telephone Encounter (Signed)
Pt states she needs a new rx for itching, advised pt appt may be needed. Please advise

## 2022-02-24 NOTE — Telephone Encounter (Signed)
LVM

## 2022-03-01 DIAGNOSIS — M1710 Unilateral primary osteoarthritis, unspecified knee: Secondary | ICD-10-CM | POA: Diagnosis not present

## 2022-03-01 DIAGNOSIS — K219 Gastro-esophageal reflux disease without esophagitis: Secondary | ICD-10-CM | POA: Diagnosis not present

## 2022-03-01 DIAGNOSIS — K5790 Diverticulosis of intestine, part unspecified, without perforation or abscess without bleeding: Secondary | ICD-10-CM | POA: Diagnosis not present

## 2022-03-01 DIAGNOSIS — I442 Atrioventricular block, complete: Secondary | ICD-10-CM | POA: Diagnosis not present

## 2022-03-01 DIAGNOSIS — E1122 Type 2 diabetes mellitus with diabetic chronic kidney disease: Secondary | ICD-10-CM | POA: Diagnosis not present

## 2022-03-01 DIAGNOSIS — E559 Vitamin D deficiency, unspecified: Secondary | ICD-10-CM | POA: Diagnosis not present

## 2022-03-01 DIAGNOSIS — I130 Hypertensive heart and chronic kidney disease with heart failure and stage 1 through stage 4 chronic kidney disease, or unspecified chronic kidney disease: Secondary | ICD-10-CM | POA: Diagnosis not present

## 2022-03-01 DIAGNOSIS — K589 Irritable bowel syndrome without diarrhea: Secondary | ICD-10-CM | POA: Diagnosis not present

## 2022-03-01 DIAGNOSIS — M810 Age-related osteoporosis without current pathological fracture: Secondary | ICD-10-CM | POA: Diagnosis not present

## 2022-03-01 DIAGNOSIS — J45991 Cough variant asthma: Secondary | ICD-10-CM | POA: Diagnosis not present

## 2022-03-01 DIAGNOSIS — I252 Old myocardial infarction: Secondary | ICD-10-CM | POA: Diagnosis not present

## 2022-03-01 DIAGNOSIS — D631 Anemia in chronic kidney disease: Secondary | ICD-10-CM | POA: Diagnosis not present

## 2022-03-01 DIAGNOSIS — J219 Acute bronchiolitis, unspecified: Secondary | ICD-10-CM | POA: Diagnosis not present

## 2022-03-01 DIAGNOSIS — H409 Unspecified glaucoma: Secondary | ICD-10-CM | POA: Diagnosis not present

## 2022-03-01 DIAGNOSIS — D509 Iron deficiency anemia, unspecified: Secondary | ICD-10-CM | POA: Diagnosis not present

## 2022-03-01 DIAGNOSIS — K649 Unspecified hemorrhoids: Secondary | ICD-10-CM | POA: Diagnosis not present

## 2022-03-01 DIAGNOSIS — I5042 Chronic combined systolic (congestive) and diastolic (congestive) heart failure: Secondary | ICD-10-CM | POA: Diagnosis not present

## 2022-03-01 DIAGNOSIS — N183 Chronic kidney disease, stage 3 unspecified: Secondary | ICD-10-CM | POA: Diagnosis not present

## 2022-03-01 DIAGNOSIS — M79642 Pain in left hand: Secondary | ICD-10-CM | POA: Diagnosis not present

## 2022-03-01 DIAGNOSIS — M79641 Pain in right hand: Secondary | ICD-10-CM | POA: Diagnosis not present

## 2022-03-01 DIAGNOSIS — I4891 Unspecified atrial fibrillation: Secondary | ICD-10-CM | POA: Diagnosis not present

## 2022-03-01 DIAGNOSIS — E785 Hyperlipidemia, unspecified: Secondary | ICD-10-CM | POA: Diagnosis not present

## 2022-03-01 DIAGNOSIS — I2510 Atherosclerotic heart disease of native coronary artery without angina pectoris: Secondary | ICD-10-CM | POA: Diagnosis not present

## 2022-03-03 ENCOUNTER — Telehealth (HOSPITAL_COMMUNITY): Payer: Self-pay

## 2022-03-03 DIAGNOSIS — M79641 Pain in right hand: Secondary | ICD-10-CM | POA: Diagnosis not present

## 2022-03-03 DIAGNOSIS — E1122 Type 2 diabetes mellitus with diabetic chronic kidney disease: Secondary | ICD-10-CM | POA: Diagnosis not present

## 2022-03-03 DIAGNOSIS — I5042 Chronic combined systolic (congestive) and diastolic (congestive) heart failure: Secondary | ICD-10-CM | POA: Diagnosis not present

## 2022-03-03 DIAGNOSIS — M1710 Unilateral primary osteoarthritis, unspecified knee: Secondary | ICD-10-CM | POA: Diagnosis not present

## 2022-03-03 DIAGNOSIS — I4891 Unspecified atrial fibrillation: Secondary | ICD-10-CM | POA: Diagnosis not present

## 2022-03-03 DIAGNOSIS — H409 Unspecified glaucoma: Secondary | ICD-10-CM | POA: Diagnosis not present

## 2022-03-03 DIAGNOSIS — E559 Vitamin D deficiency, unspecified: Secondary | ICD-10-CM | POA: Diagnosis not present

## 2022-03-03 DIAGNOSIS — K649 Unspecified hemorrhoids: Secondary | ICD-10-CM | POA: Diagnosis not present

## 2022-03-03 DIAGNOSIS — I2510 Atherosclerotic heart disease of native coronary artery without angina pectoris: Secondary | ICD-10-CM | POA: Diagnosis not present

## 2022-03-03 DIAGNOSIS — E785 Hyperlipidemia, unspecified: Secondary | ICD-10-CM | POA: Diagnosis not present

## 2022-03-03 DIAGNOSIS — D631 Anemia in chronic kidney disease: Secondary | ICD-10-CM | POA: Diagnosis not present

## 2022-03-03 DIAGNOSIS — J219 Acute bronchiolitis, unspecified: Secondary | ICD-10-CM | POA: Diagnosis not present

## 2022-03-03 DIAGNOSIS — D509 Iron deficiency anemia, unspecified: Secondary | ICD-10-CM | POA: Diagnosis not present

## 2022-03-03 DIAGNOSIS — N183 Chronic kidney disease, stage 3 unspecified: Secondary | ICD-10-CM | POA: Diagnosis not present

## 2022-03-03 DIAGNOSIS — M79642 Pain in left hand: Secondary | ICD-10-CM | POA: Diagnosis not present

## 2022-03-03 DIAGNOSIS — K5790 Diverticulosis of intestine, part unspecified, without perforation or abscess without bleeding: Secondary | ICD-10-CM | POA: Diagnosis not present

## 2022-03-03 DIAGNOSIS — I442 Atrioventricular block, complete: Secondary | ICD-10-CM | POA: Diagnosis not present

## 2022-03-03 DIAGNOSIS — M810 Age-related osteoporosis without current pathological fracture: Secondary | ICD-10-CM | POA: Diagnosis not present

## 2022-03-03 DIAGNOSIS — I252 Old myocardial infarction: Secondary | ICD-10-CM | POA: Diagnosis not present

## 2022-03-03 DIAGNOSIS — I130 Hypertensive heart and chronic kidney disease with heart failure and stage 1 through stage 4 chronic kidney disease, or unspecified chronic kidney disease: Secondary | ICD-10-CM | POA: Diagnosis not present

## 2022-03-03 DIAGNOSIS — K219 Gastro-esophageal reflux disease without esophagitis: Secondary | ICD-10-CM | POA: Diagnosis not present

## 2022-03-03 DIAGNOSIS — J45991 Cough variant asthma: Secondary | ICD-10-CM | POA: Diagnosis not present

## 2022-03-03 DIAGNOSIS — K589 Irritable bowel syndrome without diarrhea: Secondary | ICD-10-CM | POA: Diagnosis not present

## 2022-03-03 NOTE — Telephone Encounter (Signed)
Pt is not interested in the cardiac rehab program. Closed referral 

## 2022-03-07 DIAGNOSIS — D509 Iron deficiency anemia, unspecified: Secondary | ICD-10-CM | POA: Diagnosis not present

## 2022-03-07 DIAGNOSIS — D631 Anemia in chronic kidney disease: Secondary | ICD-10-CM | POA: Diagnosis not present

## 2022-03-07 DIAGNOSIS — I252 Old myocardial infarction: Secondary | ICD-10-CM | POA: Diagnosis not present

## 2022-03-07 DIAGNOSIS — M79642 Pain in left hand: Secondary | ICD-10-CM | POA: Diagnosis not present

## 2022-03-07 DIAGNOSIS — M79641 Pain in right hand: Secondary | ICD-10-CM | POA: Diagnosis not present

## 2022-03-07 DIAGNOSIS — K219 Gastro-esophageal reflux disease without esophagitis: Secondary | ICD-10-CM | POA: Diagnosis not present

## 2022-03-07 DIAGNOSIS — E1122 Type 2 diabetes mellitus with diabetic chronic kidney disease: Secondary | ICD-10-CM | POA: Diagnosis not present

## 2022-03-07 DIAGNOSIS — K589 Irritable bowel syndrome without diarrhea: Secondary | ICD-10-CM | POA: Diagnosis not present

## 2022-03-07 DIAGNOSIS — N183 Chronic kidney disease, stage 3 unspecified: Secondary | ICD-10-CM | POA: Diagnosis not present

## 2022-03-07 DIAGNOSIS — I2510 Atherosclerotic heart disease of native coronary artery without angina pectoris: Secondary | ICD-10-CM | POA: Diagnosis not present

## 2022-03-07 DIAGNOSIS — I4891 Unspecified atrial fibrillation: Secondary | ICD-10-CM | POA: Diagnosis not present

## 2022-03-07 DIAGNOSIS — E785 Hyperlipidemia, unspecified: Secondary | ICD-10-CM | POA: Diagnosis not present

## 2022-03-07 DIAGNOSIS — M810 Age-related osteoporosis without current pathological fracture: Secondary | ICD-10-CM | POA: Diagnosis not present

## 2022-03-07 DIAGNOSIS — H409 Unspecified glaucoma: Secondary | ICD-10-CM | POA: Diagnosis not present

## 2022-03-07 DIAGNOSIS — E559 Vitamin D deficiency, unspecified: Secondary | ICD-10-CM | POA: Diagnosis not present

## 2022-03-07 DIAGNOSIS — M1710 Unilateral primary osteoarthritis, unspecified knee: Secondary | ICD-10-CM | POA: Diagnosis not present

## 2022-03-07 DIAGNOSIS — I130 Hypertensive heart and chronic kidney disease with heart failure and stage 1 through stage 4 chronic kidney disease, or unspecified chronic kidney disease: Secondary | ICD-10-CM | POA: Diagnosis not present

## 2022-03-07 DIAGNOSIS — I5042 Chronic combined systolic (congestive) and diastolic (congestive) heart failure: Secondary | ICD-10-CM | POA: Diagnosis not present

## 2022-03-07 DIAGNOSIS — I442 Atrioventricular block, complete: Secondary | ICD-10-CM | POA: Diagnosis not present

## 2022-03-07 DIAGNOSIS — K5790 Diverticulosis of intestine, part unspecified, without perforation or abscess without bleeding: Secondary | ICD-10-CM | POA: Diagnosis not present

## 2022-03-07 DIAGNOSIS — K649 Unspecified hemorrhoids: Secondary | ICD-10-CM | POA: Diagnosis not present

## 2022-03-07 DIAGNOSIS — J45991 Cough variant asthma: Secondary | ICD-10-CM | POA: Diagnosis not present

## 2022-03-07 DIAGNOSIS — J219 Acute bronchiolitis, unspecified: Secondary | ICD-10-CM | POA: Diagnosis not present

## 2022-03-10 ENCOUNTER — Ambulatory Visit (INDEPENDENT_AMBULATORY_CARE_PROVIDER_SITE_OTHER): Payer: Medicare Other | Admitting: Podiatry

## 2022-03-10 DIAGNOSIS — J45991 Cough variant asthma: Secondary | ICD-10-CM | POA: Diagnosis not present

## 2022-03-10 DIAGNOSIS — K219 Gastro-esophageal reflux disease without esophagitis: Secondary | ICD-10-CM | POA: Diagnosis not present

## 2022-03-10 DIAGNOSIS — B351 Tinea unguium: Secondary | ICD-10-CM

## 2022-03-10 DIAGNOSIS — N183 Chronic kidney disease, stage 3 unspecified: Secondary | ICD-10-CM | POA: Diagnosis not present

## 2022-03-10 DIAGNOSIS — I2510 Atherosclerotic heart disease of native coronary artery without angina pectoris: Secondary | ICD-10-CM | POA: Diagnosis not present

## 2022-03-10 DIAGNOSIS — M79641 Pain in right hand: Secondary | ICD-10-CM | POA: Diagnosis not present

## 2022-03-10 DIAGNOSIS — L84 Corns and callosities: Secondary | ICD-10-CM

## 2022-03-10 DIAGNOSIS — M2012 Hallux valgus (acquired), left foot: Secondary | ICD-10-CM

## 2022-03-10 DIAGNOSIS — H409 Unspecified glaucoma: Secondary | ICD-10-CM | POA: Diagnosis not present

## 2022-03-10 DIAGNOSIS — I4891 Unspecified atrial fibrillation: Secondary | ICD-10-CM | POA: Diagnosis not present

## 2022-03-10 DIAGNOSIS — K649 Unspecified hemorrhoids: Secondary | ICD-10-CM | POA: Diagnosis not present

## 2022-03-10 DIAGNOSIS — K589 Irritable bowel syndrome without diarrhea: Secondary | ICD-10-CM | POA: Diagnosis not present

## 2022-03-10 DIAGNOSIS — M79642 Pain in left hand: Secondary | ICD-10-CM | POA: Diagnosis not present

## 2022-03-10 DIAGNOSIS — D631 Anemia in chronic kidney disease: Secondary | ICD-10-CM | POA: Diagnosis not present

## 2022-03-10 DIAGNOSIS — M2011 Hallux valgus (acquired), right foot: Secondary | ICD-10-CM

## 2022-03-10 DIAGNOSIS — Q828 Other specified congenital malformations of skin: Secondary | ICD-10-CM | POA: Diagnosis not present

## 2022-03-10 DIAGNOSIS — J219 Acute bronchiolitis, unspecified: Secondary | ICD-10-CM | POA: Diagnosis not present

## 2022-03-10 DIAGNOSIS — E559 Vitamin D deficiency, unspecified: Secondary | ICD-10-CM | POA: Diagnosis not present

## 2022-03-10 DIAGNOSIS — I5042 Chronic combined systolic (congestive) and diastolic (congestive) heart failure: Secondary | ICD-10-CM | POA: Diagnosis not present

## 2022-03-10 DIAGNOSIS — E1122 Type 2 diabetes mellitus with diabetic chronic kidney disease: Secondary | ICD-10-CM | POA: Diagnosis not present

## 2022-03-10 DIAGNOSIS — E785 Hyperlipidemia, unspecified: Secondary | ICD-10-CM | POA: Diagnosis not present

## 2022-03-10 DIAGNOSIS — M79675 Pain in left toe(s): Secondary | ICD-10-CM | POA: Diagnosis not present

## 2022-03-10 DIAGNOSIS — I130 Hypertensive heart and chronic kidney disease with heart failure and stage 1 through stage 4 chronic kidney disease, or unspecified chronic kidney disease: Secondary | ICD-10-CM | POA: Diagnosis not present

## 2022-03-10 DIAGNOSIS — M1710 Unilateral primary osteoarthritis, unspecified knee: Secondary | ICD-10-CM | POA: Diagnosis not present

## 2022-03-10 DIAGNOSIS — I442 Atrioventricular block, complete: Secondary | ICD-10-CM | POA: Diagnosis not present

## 2022-03-10 DIAGNOSIS — M79674 Pain in right toe(s): Secondary | ICD-10-CM | POA: Diagnosis not present

## 2022-03-10 DIAGNOSIS — I252 Old myocardial infarction: Secondary | ICD-10-CM | POA: Diagnosis not present

## 2022-03-10 DIAGNOSIS — E1151 Type 2 diabetes mellitus with diabetic peripheral angiopathy without gangrene: Secondary | ICD-10-CM | POA: Diagnosis not present

## 2022-03-10 DIAGNOSIS — D509 Iron deficiency anemia, unspecified: Secondary | ICD-10-CM | POA: Diagnosis not present

## 2022-03-10 DIAGNOSIS — E119 Type 2 diabetes mellitus without complications: Secondary | ICD-10-CM

## 2022-03-10 DIAGNOSIS — M810 Age-related osteoporosis without current pathological fracture: Secondary | ICD-10-CM | POA: Diagnosis not present

## 2022-03-10 DIAGNOSIS — K5790 Diverticulosis of intestine, part unspecified, without perforation or abscess without bleeding: Secondary | ICD-10-CM | POA: Diagnosis not present

## 2022-03-14 ENCOUNTER — Encounter: Payer: Self-pay | Admitting: Podiatry

## 2022-03-14 ENCOUNTER — Other Ambulatory Visit: Payer: Self-pay | Admitting: Cardiology

## 2022-03-14 DIAGNOSIS — M1710 Unilateral primary osteoarthritis, unspecified knee: Secondary | ICD-10-CM | POA: Diagnosis not present

## 2022-03-14 DIAGNOSIS — I2510 Atherosclerotic heart disease of native coronary artery without angina pectoris: Secondary | ICD-10-CM | POA: Diagnosis not present

## 2022-03-14 DIAGNOSIS — E1122 Type 2 diabetes mellitus with diabetic chronic kidney disease: Secondary | ICD-10-CM | POA: Diagnosis not present

## 2022-03-14 DIAGNOSIS — E559 Vitamin D deficiency, unspecified: Secondary | ICD-10-CM | POA: Diagnosis not present

## 2022-03-14 DIAGNOSIS — K589 Irritable bowel syndrome without diarrhea: Secondary | ICD-10-CM | POA: Diagnosis not present

## 2022-03-14 DIAGNOSIS — K649 Unspecified hemorrhoids: Secondary | ICD-10-CM | POA: Diagnosis not present

## 2022-03-14 DIAGNOSIS — I442 Atrioventricular block, complete: Secondary | ICD-10-CM | POA: Diagnosis not present

## 2022-03-14 DIAGNOSIS — D631 Anemia in chronic kidney disease: Secondary | ICD-10-CM | POA: Diagnosis not present

## 2022-03-14 DIAGNOSIS — I130 Hypertensive heart and chronic kidney disease with heart failure and stage 1 through stage 4 chronic kidney disease, or unspecified chronic kidney disease: Secondary | ICD-10-CM | POA: Diagnosis not present

## 2022-03-14 DIAGNOSIS — M79641 Pain in right hand: Secondary | ICD-10-CM | POA: Diagnosis not present

## 2022-03-14 DIAGNOSIS — M810 Age-related osteoporosis without current pathological fracture: Secondary | ICD-10-CM | POA: Diagnosis not present

## 2022-03-14 DIAGNOSIS — I252 Old myocardial infarction: Secondary | ICD-10-CM | POA: Diagnosis not present

## 2022-03-14 DIAGNOSIS — M79642 Pain in left hand: Secondary | ICD-10-CM | POA: Diagnosis not present

## 2022-03-14 DIAGNOSIS — J45991 Cough variant asthma: Secondary | ICD-10-CM | POA: Diagnosis not present

## 2022-03-14 DIAGNOSIS — I4891 Unspecified atrial fibrillation: Secondary | ICD-10-CM | POA: Diagnosis not present

## 2022-03-14 DIAGNOSIS — K5790 Diverticulosis of intestine, part unspecified, without perforation or abscess without bleeding: Secondary | ICD-10-CM | POA: Diagnosis not present

## 2022-03-14 DIAGNOSIS — H409 Unspecified glaucoma: Secondary | ICD-10-CM | POA: Diagnosis not present

## 2022-03-14 DIAGNOSIS — N183 Chronic kidney disease, stage 3 unspecified: Secondary | ICD-10-CM | POA: Diagnosis not present

## 2022-03-14 DIAGNOSIS — J219 Acute bronchiolitis, unspecified: Secondary | ICD-10-CM | POA: Diagnosis not present

## 2022-03-14 DIAGNOSIS — I5042 Chronic combined systolic (congestive) and diastolic (congestive) heart failure: Secondary | ICD-10-CM | POA: Diagnosis not present

## 2022-03-14 DIAGNOSIS — D509 Iron deficiency anemia, unspecified: Secondary | ICD-10-CM | POA: Diagnosis not present

## 2022-03-14 DIAGNOSIS — E785 Hyperlipidemia, unspecified: Secondary | ICD-10-CM | POA: Diagnosis not present

## 2022-03-14 DIAGNOSIS — K219 Gastro-esophageal reflux disease without esophagitis: Secondary | ICD-10-CM | POA: Diagnosis not present

## 2022-03-14 NOTE — Progress Notes (Addendum)
ANNUAL DIABETIC FOOT EXAM  Subjective: [name] presents today for annual diabetic foot examination.  Patient relates 25 year h/o diabetes.  Patient denies any h/o foot wounds.  Patient denies any numbness, tingling, burning, or pins/needle sensation in feet.  Last known  HgA1c was 5%.  Patient did not check blood glucose this morning.  Patient does not monitor blood glucose daily.  Risk factors: diabetes, HTN, CHF, CKD, hyperlipidemia, h/o tobacco use in remission.  [name], FNP is patient's PCP. Last visit was January 24, 2022.  Past Medical History:  Diagnosis Date   Allergic rhinitis    Anterior chest wall pain    Anxiety    Asthma    Chronic diastolic CHF (congestive heart failure) ([place])    Echo 01/2020: EF 60-65, normal wall motion, mild LVH, normal RV SF, RVSP 52.6 (moderate elevation), severe LAE, moderate RAE, trivial MR, mild MS (mean gradient 5.5 mmHg), mild aortic valve sclerosis (no AS); elevated E/e' c/w elevated LVEDP   Cough    DM type 2 (diabetes mellitus, type 2) (HCC)    GERD (gastroesophageal reflux disease)    History of diverticulitis of colon    HTN (hypertension)    Hyperlipidemia    IBS (irritable bowel syndrome)    Iron deficiency anemia    Osteoporosis, unspecified    Renal insufficiency 07/31/2017   UTI (urinary tract infection)    Patient Active Problem List   Diagnosis Date Noted   Acute kidney injury superimposed on chronic kidney disease ([place]) 01/02/2022   NSTEMI (non-ST elevated myocardial infarction) ([place]) 12/23/2021   Osteoarthritis of knee 03/30/2021   Bilateral hand pain 03/08/2021   Complete heart block ([place]) 12/13/2020   Pacemaker - MDT 12/13/2020   SOB (shortness of breath) 07/29/2020   Encntr for surgical aftcr following surgery on the circ sys 07/26/2020   Pain in right knee 03/17/2020   Chronic combined systolic (congestive) and diastolic (congestive) heart failure ([place])    DKA (diabetic ketoacidoses)  12/20/2019   Diverticulosis 12/10/2019   Age-related osteoporosis without current pathological fracture 10/03/2019   Chronic kidney disease, stage 3 unspecified ([place]) 10/03/2019   Long term (current) use of insulin ([place]) 10/03/2019   Hyperlipidemia, unspecified 10/03/2019   Shortness of breath 01/30/2018   Acute bronchiolitis 12/17/2017   Wheezing 10/29/2017   Vitamin D deficiency 07/31/2017   Renal insufficiency 07/31/2017   Knee pain 06/01/2017   Morbid obesity due to excess calories ([place]) 01/25/2017   Diabetes ([place]) 04/12/2016   Cough 01/20/2015   Atrial fibrillation ([place]) 05/28/2014   Personal history of colonic polyps 05/28/2014   Vomiting 01/29/2014   CAD (coronary artery disease) 09/01/2013   Routine general medical examination at a health care facility 07/17/2011   Encounter for long-term (current) use of other medications 07/06/2011   Nonspecific (abnormal) findings on radiological and other examination of body structure 11/09/2009   IRRITABLE BOWEL SYNDROME 05/07/2009   CHEST PAIN 05/07/2009   ADNEXAL MASS, RIGHT 07/22/2008   HEMORRHOIDS, RECURRENT 01/27/2008   Diverticulitis 01/27/2008   OTH ABNORMAL FIND RAD EXAMINATION BREAST 01/27/2008   GERD 01/13/2008   UTI 09/28/2007   Dyslipidemia 05/27/2007   ANEMIA-IRON DEFICIENCY 05/27/2007   ANXIETY 05/27/2007   Essential hypertension 05/27/2007   Seasonal allergic rhinitis 05/27/2007   Cough variant asthma 05/27/2007   Osteoporosis 05/27/2007   DIVERTICULITIS, HX OF 05/27/2007   Past Surgical History:  Procedure Laterality Date   CARDIOVASCULAR STRESS TEST  02/25/04   CORONARY BALLOON ANGIOPLASTY N/A 12/28/2021  Procedure: CORONARY BALLOON ANGIOPLASTY;  Surgeon: Burnell Blanks, MD;  Location: [facility] [facility];  Service: Cardiovascular;  Laterality: N/A;   CORONARY STENT INTERVENTION N/A 12/28/2021   Procedure: CORONARY STENT INTERVENTION;  Surgeon: Burnell Blanks, MD;  Location: [place];   Service: Cardiovascular;  Laterality: N/A;   ESOPHAGOGASTRODUODENOSCOPY  12/26/01   PACEMAKER IMPLANT N/A 08/02/2020   Procedure: PACEMAKER IMPLANT;  Surgeon: Deboraha Sprang, MD;  Location: [place] [facility];  Service: Cardiovascular;  Laterality: N/A;   RIGHT OOPHORECTOMY  jan 2010   RIGHT/LEFT HEART CATH AND CORONARY ANGIOGRAPHY N/A 12/28/2021   Procedure: RIGHT/LEFT HEART CATH AND CORONARY ANGIOGRAPHY;  Surgeon: Burnell Blanks, MD;  Location: [place];  Service: Cardiovascular;  Laterality: N/A;   Current Outpatient Medications on File Prior to Visit  Medication Sig Dispense Refill   acetaminophen (TYLENOL) 325 MG tablet Take 650 mg by mouth every 6 (six) hours as needed for pain.     albuterol (PROAIR HFA) 108 (90 Base) MCG/ACT inhaler 2 puffs every 4 hours as needed only  if your can't catch your breath 18 g 11   albuterol (PROVENTIL) (2.5 MG/3ML) 0.083% nebulizer solution Take 3 mLs (2.5 mg total) by nebulization every 6 (six) hours as needed for wheezing or shortness of breath. 150 mL 1   Ascorbic Acid (VITAMIN C) 1000 MG tablet Take 1,000 mg by mouth daily.     aspirin 81 MG EC tablet Take 1 tablet (81 mg total) by mouth daily. Swallow whole. 30 tablet 0   atorvastatin (LIPITOR) 80 MG tablet Take 1 tablet (80 mg total) by mouth daily. 90 tablet 1   azithromycin (ZITHROMAX) 250 MG tablet Take 2 on day one then 1 daily x 4 days 6 tablet 0   BD VEO INSULIN SYRINGE U/F 31G X 15/64" 0.5 ML MISC Inject into the skin 2 (two) times daily.     Blood Glucose Monitoring Suppl (ONETOUCH VERIO FLEX SYSTEM) w/Device KIT USE AS DIRECTED 1 kit .   budesonide-formoterol (SYMBICORT) 160-4.5 MCG/ACT inhaler TAKE 2 PUFFS FIRST THING IN AM AND THEN ANOTHER 2 PUFFS ABOUT 12 HOURS LATER. 30.6 each 5   carvedilol (COREG) 3.125 MG tablet Take 1 tablet (3.125 mg total) by mouth 2 (two) times daily with a meal. 180 tablet 0   cetirizine (ZYRTEC ALLERGY) 10 MG tablet Take 1 tablet (10 mg total)  by mouth daily as needed for allergies.     clopidogrel (PLAVIX) 75 MG tablet Take 1 tablet (75 mg total) by mouth daily with breakfast. 90 tablet 2   diclofenac Sodium (VOLTAREN) 1 % GEL Apply 4 g topically 4 (four) times daily as needed (pain).     ELIQUIS 2.5 MG TABS tablet TAKE 1 TABLET BY MOUTH TWICE A DAY 60 tablet 6   famotidine (PEPCID) 20 MG tablet Take 1 tablet (20 mg total) by mouth 2 (two) times daily. 20 tablet 0   furosemide (LASIX) 40 MG tablet Take 1 tablet (40 mg total) by mouth 2 (two) times daily. (Patient taking differently: Take 40 mg by mouth daily.) 60 tablet 1   glucose blood (ONETOUCH VERIO) test strip 1 each by Other route 2 (two) times daily. And lancets 2/day 200 each 3   glucose blood test strip Check blood sugar twice a day 100 each 12   hydrALAZINE (APRESOLINE) 25 MG tablet TAKE 3 TABLETS (75 MG TOTAL) BY MOUTH 3 (THREE) TIMES DAILY. 810 tablet 2   insulin lispro (  HUMALOG) 100 UNIT/ML injection Give 3 units with BREAKFAST, AND 5 units with SUPPER 60 mL 1   latanoprost (XALATAN) 0.005 % ophthalmic solution Place 1 drop into both eyes at bedtime.     montelukast (SINGULAIR) 10 MG tablet TAKE 1 TABLET BY MOUTH EVERYDAY AT BEDTIME 90 tablet 3   nitroGLYCERIN (NITROSTAT) 0.4 MG SL tablet Place 1 tablet (0.4 mg total) under the tongue every 5 (five) minutes x 3 doses as needed for chest pain. 25 tablet 2   pantoprazole (PROTONIX) 40 MG tablet Take 1 tablet (40 mg total) by mouth 2 (two) times daily. 180 tablet 0   potassium chloride (KLOR-CON) 10 MEQ tablet TAKE 1 TABLET BY MOUTH EVERY DAY 90 tablet 2   pravastatin (PRAVACHOL) 40 MG tablet Take 40 mg by mouth daily.     Syringe/Needle, Disp, (SYRINGE 3CC/25GX1") 25G X 1" 3 ML MISC Use to inject into the skin 2x a day 100 each 3   traMADol (ULTRAM) 50 MG tablet Take 50 mg by mouth every 6 (six) hours as needed for moderate pain.     Current Facility-Administered Medications on File Prior to Visit  Medication Dose Route  Frequency Provider Last Rate Last Admin   ipratropium-albuterol (DUONEB) 0.5-2.5 (3) MG/3ML nebulizer solution 3 mL  3 mL Nebulization Once [name], FNP        Allergies  Allergen Reactions   Aspirin Shortness Of Breath and Other (See Comments)    Caused asthma symptoms   Ramipril Cough   Social History   Occupational History   Occupation: retired  Tobacco Use   Smoking status: Former    Packs/day: 0.30    Years: 5.00    Total pack years: 1.50    Types: Cigarettes    Quit date: 10/02/1986    Years since quitting: 35.4   Smokeless tobacco: Never  Vaping Use   Vaping Use: Never used  Substance and Sexual Activity   Alcohol use: No   Drug use: No   Sexual activity: Not on file   Family History  Problem Relation Age of Onset   Lung cancer Brother    Melanoma Brother    Hypertension Mother    Stroke Mother    Other Father        poor circulation   Cancer Sister    Diabetes Daughter    Atopy Neg Hx    Asthma Neg Hx    Breast cancer Neg Hx    Immunization History  Administered Date(s) Administered   Fluad Quad(high Dose 65+) 06/12/2019, 10/08/2020, 09/28/2021   Influenza Split 07/17/2011, 07/05/2012   Influenza Whole 07/22/2008   Influenza, High Dose Seasonal PF 07/08/2018, 06/12/2019   Influenza,inj,Quad PF,6+ Mos 07/18/2013, 06/16/2014   Influenza-Unspecified 07/17/2011, 07/05/2012, 07/18/2013, 06/16/2014, 07/08/2018   PFIZER(Purple Top)SARS-COV-2 Vaccination 05/05/2020, 06/04/2020   Pneumococcal Conjugate-13 04/12/2016   Pneumococcal Polysaccharide-23 10/02/2006   Td 08/02/1998     Review of Systems: Negative except as noted in the HPI.   Objective: There were no vitals filed for this visit.  TERE MCCONAUGHEY is a pleasant 86 y.o. female in NAD. AAO X 3.  Vascular Examination: CFT <3 seconds b/l LE. Faintly palpable DP pulses b/l LE. Faintly palpable PT pulse(s) b/l LE. Pedal hair absent. No pain with calf compression b/l. Lower extremity skin  temperature gradient within normal limits. Lower extremity skin temperature gradient warm to cool. No edema noted b/l LE. No cyanosis or clubbing noted b/l LE.  Dermatological Examination: Pedal integument with  normal turgor, texture and tone b/l LE. No open wounds b/l. No interdigital macerations b/l. Toenails 1-5 b/l elongated, thickened, discolored with subungual debris. +Tenderness with dorsal palpation of nailplates. Hyperkeratotic lesion(s) noted R 5th toe and sub 5th met base left foot. Porokeratotic lesion(s) plantar heel pad of left foot. No erythema, no edema, no drainage, no fluctuance.  Neurological Examination: Protective sensation intact 5/5 intact bilaterally with 10g monofilament b/l. Vibratory sensation intact b/l.  Musculoskeletal Examination: Normal muscle strength 5/5 to all lower extremity muscle groups bilaterally. HAV with bunion bilaterally and hammertoes 2-5 b/l.Marland Kitchen No pain, crepitus or joint limitation noted with ROM b/l LE.  Patient ambulates independently without assistive aids. Tailor's bunion deformity noted b/l LE.  Footwear Assessment: Does the patient wear appropriate shoes? Yes. Does the patient need inserts/orthotics? Yes.     Latest Ref Rng & Units 12/23/2021   10:30 AM 12/12/2021    8:09 AM  Hemoglobin A1C  Hemoglobin-A1c 4.8 - 5.6 % 5.0  5.2    Assessment: 1. Pain due to onychomycosis of toenails of both feet   2. Corns and callosities   3. Porokeratosis   4. Hallux valgus, acquired, bilateral   5. Type II diabetes mellitus with peripheral circulatory disorder (HCC)   6. Encounter for diabetic foot exam ([place])     ADA Risk Categorization: Low Risk :  Patient has all of the following: Intact protective sensation No prior foot ulcer  No severe deformity Pedal pulses present  Plan: -Patient was evaluated and treated. All patient's and/or POA's questions/concerns answered on today's visit. -Medicaid ABN on file for paring of  corns/calluses. -Diabetic foot examination performed today. -Continue foot and shoe inspections daily. Monitor blood glucose per PCP/Endocrinologist's recommendations. -Discussed diabetic shoe benefit available based on patient's diagnoses. Patient declines on today's visit and states she will think about it. Recommended Skechers with stretchable uppers and memory foam insoles. -Toenails 1-5 b/l were debrided in length and girth with sterile nail nippers and dremel without iatrogenic bleeding.  -Corn(s) R 5th toe and callus(es) submet head 5 right foot and sub 5th met base left foot were pared utilizing sterile scalpel blade without incident. Total number debrided =3. -Porokeratotic lesion(s) plantar heel pad of left foot pared and enucleated with sterile scalpel blade without incident. Total number of lesions debrided=1. -Patient/POA to call should there be question/concern in the interim. Return in about 3 months (around 06/10/2022).  Marzetta Board, DPM

## 2022-03-15 DIAGNOSIS — I4891 Unspecified atrial fibrillation: Secondary | ICD-10-CM | POA: Diagnosis not present

## 2022-03-15 DIAGNOSIS — K5790 Diverticulosis of intestine, part unspecified, without perforation or abscess without bleeding: Secondary | ICD-10-CM | POA: Diagnosis not present

## 2022-03-15 DIAGNOSIS — E559 Vitamin D deficiency, unspecified: Secondary | ICD-10-CM | POA: Diagnosis not present

## 2022-03-15 DIAGNOSIS — K649 Unspecified hemorrhoids: Secondary | ICD-10-CM | POA: Diagnosis not present

## 2022-03-15 DIAGNOSIS — I2510 Atherosclerotic heart disease of native coronary artery without angina pectoris: Secondary | ICD-10-CM | POA: Diagnosis not present

## 2022-03-15 DIAGNOSIS — D631 Anemia in chronic kidney disease: Secondary | ICD-10-CM | POA: Diagnosis not present

## 2022-03-15 DIAGNOSIS — M1710 Unilateral primary osteoarthritis, unspecified knee: Secondary | ICD-10-CM | POA: Diagnosis not present

## 2022-03-15 DIAGNOSIS — I130 Hypertensive heart and chronic kidney disease with heart failure and stage 1 through stage 4 chronic kidney disease, or unspecified chronic kidney disease: Secondary | ICD-10-CM | POA: Diagnosis not present

## 2022-03-15 DIAGNOSIS — I5042 Chronic combined systolic (congestive) and diastolic (congestive) heart failure: Secondary | ICD-10-CM | POA: Diagnosis not present

## 2022-03-15 DIAGNOSIS — H409 Unspecified glaucoma: Secondary | ICD-10-CM | POA: Diagnosis not present

## 2022-03-15 DIAGNOSIS — E785 Hyperlipidemia, unspecified: Secondary | ICD-10-CM | POA: Diagnosis not present

## 2022-03-15 DIAGNOSIS — J219 Acute bronchiolitis, unspecified: Secondary | ICD-10-CM | POA: Diagnosis not present

## 2022-03-15 DIAGNOSIS — M810 Age-related osteoporosis without current pathological fracture: Secondary | ICD-10-CM | POA: Diagnosis not present

## 2022-03-15 DIAGNOSIS — M79641 Pain in right hand: Secondary | ICD-10-CM | POA: Diagnosis not present

## 2022-03-15 DIAGNOSIS — E1122 Type 2 diabetes mellitus with diabetic chronic kidney disease: Secondary | ICD-10-CM | POA: Diagnosis not present

## 2022-03-15 DIAGNOSIS — I442 Atrioventricular block, complete: Secondary | ICD-10-CM | POA: Diagnosis not present

## 2022-03-15 DIAGNOSIS — K219 Gastro-esophageal reflux disease without esophagitis: Secondary | ICD-10-CM | POA: Diagnosis not present

## 2022-03-15 DIAGNOSIS — N183 Chronic kidney disease, stage 3 unspecified: Secondary | ICD-10-CM | POA: Diagnosis not present

## 2022-03-15 DIAGNOSIS — K589 Irritable bowel syndrome without diarrhea: Secondary | ICD-10-CM | POA: Diagnosis not present

## 2022-03-15 DIAGNOSIS — M79642 Pain in left hand: Secondary | ICD-10-CM | POA: Diagnosis not present

## 2022-03-15 DIAGNOSIS — D509 Iron deficiency anemia, unspecified: Secondary | ICD-10-CM | POA: Diagnosis not present

## 2022-03-15 DIAGNOSIS — J45991 Cough variant asthma: Secondary | ICD-10-CM | POA: Diagnosis not present

## 2022-03-15 DIAGNOSIS — I252 Old myocardial infarction: Secondary | ICD-10-CM | POA: Diagnosis not present

## 2022-03-17 DIAGNOSIS — K649 Unspecified hemorrhoids: Secondary | ICD-10-CM | POA: Diagnosis not present

## 2022-03-17 DIAGNOSIS — I4891 Unspecified atrial fibrillation: Secondary | ICD-10-CM | POA: Diagnosis not present

## 2022-03-17 DIAGNOSIS — I442 Atrioventricular block, complete: Secondary | ICD-10-CM | POA: Diagnosis not present

## 2022-03-17 DIAGNOSIS — E785 Hyperlipidemia, unspecified: Secondary | ICD-10-CM | POA: Diagnosis not present

## 2022-03-17 DIAGNOSIS — E1122 Type 2 diabetes mellitus with diabetic chronic kidney disease: Secondary | ICD-10-CM | POA: Diagnosis not present

## 2022-03-17 DIAGNOSIS — K219 Gastro-esophageal reflux disease without esophagitis: Secondary | ICD-10-CM | POA: Diagnosis not present

## 2022-03-17 DIAGNOSIS — M79642 Pain in left hand: Secondary | ICD-10-CM | POA: Diagnosis not present

## 2022-03-17 DIAGNOSIS — M79641 Pain in right hand: Secondary | ICD-10-CM | POA: Diagnosis not present

## 2022-03-17 DIAGNOSIS — M1710 Unilateral primary osteoarthritis, unspecified knee: Secondary | ICD-10-CM | POA: Diagnosis not present

## 2022-03-17 DIAGNOSIS — J45991 Cough variant asthma: Secondary | ICD-10-CM | POA: Diagnosis not present

## 2022-03-17 DIAGNOSIS — I2510 Atherosclerotic heart disease of native coronary artery without angina pectoris: Secondary | ICD-10-CM | POA: Diagnosis not present

## 2022-03-17 DIAGNOSIS — M810 Age-related osteoporosis without current pathological fracture: Secondary | ICD-10-CM | POA: Diagnosis not present

## 2022-03-17 DIAGNOSIS — H409 Unspecified glaucoma: Secondary | ICD-10-CM | POA: Diagnosis not present

## 2022-03-17 DIAGNOSIS — E559 Vitamin D deficiency, unspecified: Secondary | ICD-10-CM | POA: Diagnosis not present

## 2022-03-17 DIAGNOSIS — J219 Acute bronchiolitis, unspecified: Secondary | ICD-10-CM | POA: Diagnosis not present

## 2022-03-17 DIAGNOSIS — I5042 Chronic combined systolic (congestive) and diastolic (congestive) heart failure: Secondary | ICD-10-CM | POA: Diagnosis not present

## 2022-03-17 DIAGNOSIS — K589 Irritable bowel syndrome without diarrhea: Secondary | ICD-10-CM | POA: Diagnosis not present

## 2022-03-17 DIAGNOSIS — D509 Iron deficiency anemia, unspecified: Secondary | ICD-10-CM | POA: Diagnosis not present

## 2022-03-17 DIAGNOSIS — K5790 Diverticulosis of intestine, part unspecified, without perforation or abscess without bleeding: Secondary | ICD-10-CM | POA: Diagnosis not present

## 2022-03-17 DIAGNOSIS — N183 Chronic kidney disease, stage 3 unspecified: Secondary | ICD-10-CM | POA: Diagnosis not present

## 2022-03-17 DIAGNOSIS — I130 Hypertensive heart and chronic kidney disease with heart failure and stage 1 through stage 4 chronic kidney disease, or unspecified chronic kidney disease: Secondary | ICD-10-CM | POA: Diagnosis not present

## 2022-03-17 DIAGNOSIS — I252 Old myocardial infarction: Secondary | ICD-10-CM | POA: Diagnosis not present

## 2022-03-17 DIAGNOSIS — D631 Anemia in chronic kidney disease: Secondary | ICD-10-CM | POA: Diagnosis not present

## 2022-03-20 ENCOUNTER — Telehealth: Payer: Self-pay

## 2022-03-21 NOTE — Telephone Encounter (Signed)
done

## 2022-03-23 DIAGNOSIS — M1710 Unilateral primary osteoarthritis, unspecified knee: Secondary | ICD-10-CM | POA: Diagnosis not present

## 2022-03-23 DIAGNOSIS — M79642 Pain in left hand: Secondary | ICD-10-CM | POA: Diagnosis not present

## 2022-03-23 DIAGNOSIS — M1711 Unilateral primary osteoarthritis, right knee: Secondary | ICD-10-CM | POA: Diagnosis not present

## 2022-03-23 DIAGNOSIS — M810 Age-related osteoporosis without current pathological fracture: Secondary | ICD-10-CM | POA: Diagnosis not present

## 2022-03-23 DIAGNOSIS — E559 Vitamin D deficiency, unspecified: Secondary | ICD-10-CM | POA: Diagnosis not present

## 2022-03-23 DIAGNOSIS — N183 Chronic kidney disease, stage 3 unspecified: Secondary | ICD-10-CM | POA: Diagnosis not present

## 2022-03-23 DIAGNOSIS — K5790 Diverticulosis of intestine, part unspecified, without perforation or abscess without bleeding: Secondary | ICD-10-CM | POA: Diagnosis not present

## 2022-03-23 DIAGNOSIS — I4891 Unspecified atrial fibrillation: Secondary | ICD-10-CM | POA: Diagnosis not present

## 2022-03-23 DIAGNOSIS — I5042 Chronic combined systolic (congestive) and diastolic (congestive) heart failure: Secondary | ICD-10-CM | POA: Diagnosis not present

## 2022-03-23 DIAGNOSIS — I2510 Atherosclerotic heart disease of native coronary artery without angina pectoris: Secondary | ICD-10-CM | POA: Diagnosis not present

## 2022-03-23 DIAGNOSIS — J219 Acute bronchiolitis, unspecified: Secondary | ICD-10-CM | POA: Diagnosis not present

## 2022-03-23 DIAGNOSIS — K589 Irritable bowel syndrome without diarrhea: Secondary | ICD-10-CM | POA: Diagnosis not present

## 2022-03-23 DIAGNOSIS — E785 Hyperlipidemia, unspecified: Secondary | ICD-10-CM | POA: Diagnosis not present

## 2022-03-23 DIAGNOSIS — D631 Anemia in chronic kidney disease: Secondary | ICD-10-CM | POA: Diagnosis not present

## 2022-03-23 DIAGNOSIS — I252 Old myocardial infarction: Secondary | ICD-10-CM | POA: Diagnosis not present

## 2022-03-23 DIAGNOSIS — H409 Unspecified glaucoma: Secondary | ICD-10-CM | POA: Diagnosis not present

## 2022-03-23 DIAGNOSIS — J45991 Cough variant asthma: Secondary | ICD-10-CM | POA: Diagnosis not present

## 2022-03-23 DIAGNOSIS — I130 Hypertensive heart and chronic kidney disease with heart failure and stage 1 through stage 4 chronic kidney disease, or unspecified chronic kidney disease: Secondary | ICD-10-CM | POA: Diagnosis not present

## 2022-03-23 DIAGNOSIS — D509 Iron deficiency anemia, unspecified: Secondary | ICD-10-CM | POA: Diagnosis not present

## 2022-03-23 DIAGNOSIS — M25561 Pain in right knee: Secondary | ICD-10-CM | POA: Diagnosis not present

## 2022-03-23 DIAGNOSIS — I442 Atrioventricular block, complete: Secondary | ICD-10-CM | POA: Diagnosis not present

## 2022-03-23 DIAGNOSIS — K219 Gastro-esophageal reflux disease without esophagitis: Secondary | ICD-10-CM | POA: Diagnosis not present

## 2022-03-23 DIAGNOSIS — K649 Unspecified hemorrhoids: Secondary | ICD-10-CM | POA: Diagnosis not present

## 2022-03-23 DIAGNOSIS — E1122 Type 2 diabetes mellitus with diabetic chronic kidney disease: Secondary | ICD-10-CM | POA: Diagnosis not present

## 2022-03-23 DIAGNOSIS — M79641 Pain in right hand: Secondary | ICD-10-CM | POA: Diagnosis not present

## 2022-03-27 DIAGNOSIS — M79642 Pain in left hand: Secondary | ICD-10-CM | POA: Diagnosis not present

## 2022-03-27 DIAGNOSIS — M1710 Unilateral primary osteoarthritis, unspecified knee: Secondary | ICD-10-CM | POA: Diagnosis not present

## 2022-03-27 DIAGNOSIS — K589 Irritable bowel syndrome without diarrhea: Secondary | ICD-10-CM | POA: Diagnosis not present

## 2022-03-27 DIAGNOSIS — D631 Anemia in chronic kidney disease: Secondary | ICD-10-CM | POA: Diagnosis not present

## 2022-03-27 DIAGNOSIS — J45991 Cough variant asthma: Secondary | ICD-10-CM | POA: Diagnosis not present

## 2022-03-27 DIAGNOSIS — K5790 Diverticulosis of intestine, part unspecified, without perforation or abscess without bleeding: Secondary | ICD-10-CM | POA: Diagnosis not present

## 2022-03-27 DIAGNOSIS — M810 Age-related osteoporosis without current pathological fracture: Secondary | ICD-10-CM | POA: Diagnosis not present

## 2022-03-27 DIAGNOSIS — J219 Acute bronchiolitis, unspecified: Secondary | ICD-10-CM | POA: Diagnosis not present

## 2022-03-27 DIAGNOSIS — I4891 Unspecified atrial fibrillation: Secondary | ICD-10-CM | POA: Diagnosis not present

## 2022-03-27 DIAGNOSIS — I130 Hypertensive heart and chronic kidney disease with heart failure and stage 1 through stage 4 chronic kidney disease, or unspecified chronic kidney disease: Secondary | ICD-10-CM | POA: Diagnosis not present

## 2022-03-27 DIAGNOSIS — I5042 Chronic combined systolic (congestive) and diastolic (congestive) heart failure: Secondary | ICD-10-CM | POA: Diagnosis not present

## 2022-03-27 DIAGNOSIS — N183 Chronic kidney disease, stage 3 unspecified: Secondary | ICD-10-CM | POA: Diagnosis not present

## 2022-03-27 DIAGNOSIS — I252 Old myocardial infarction: Secondary | ICD-10-CM | POA: Diagnosis not present

## 2022-03-27 DIAGNOSIS — I2510 Atherosclerotic heart disease of native coronary artery without angina pectoris: Secondary | ICD-10-CM | POA: Diagnosis not present

## 2022-03-27 DIAGNOSIS — K219 Gastro-esophageal reflux disease without esophagitis: Secondary | ICD-10-CM | POA: Diagnosis not present

## 2022-03-27 DIAGNOSIS — I442 Atrioventricular block, complete: Secondary | ICD-10-CM | POA: Diagnosis not present

## 2022-03-27 DIAGNOSIS — H409 Unspecified glaucoma: Secondary | ICD-10-CM | POA: Diagnosis not present

## 2022-03-27 DIAGNOSIS — E1122 Type 2 diabetes mellitus with diabetic chronic kidney disease: Secondary | ICD-10-CM | POA: Diagnosis not present

## 2022-03-27 DIAGNOSIS — E785 Hyperlipidemia, unspecified: Secondary | ICD-10-CM | POA: Diagnosis not present

## 2022-03-27 DIAGNOSIS — E559 Vitamin D deficiency, unspecified: Secondary | ICD-10-CM | POA: Diagnosis not present

## 2022-03-27 DIAGNOSIS — D509 Iron deficiency anemia, unspecified: Secondary | ICD-10-CM | POA: Diagnosis not present

## 2022-03-27 DIAGNOSIS — M79641 Pain in right hand: Secondary | ICD-10-CM | POA: Diagnosis not present

## 2022-03-27 DIAGNOSIS — K649 Unspecified hemorrhoids: Secondary | ICD-10-CM | POA: Diagnosis not present

## 2022-04-03 ENCOUNTER — Telehealth: Payer: Self-pay | Admitting: Family

## 2022-04-03 ENCOUNTER — Telehealth: Payer: Self-pay | Admitting: Internal Medicine

## 2022-04-03 MED ORDER — ATORVASTATIN CALCIUM 80 MG PO TABS: 80.0000 mg | ORAL_TABLET | Freq: Every day | ORAL | 3 refills | Status: DC

## 2022-04-03 MED ORDER — CLOPIDOGREL BISULFATE 75 MG PO TABS: 75.0000 mg | ORAL_TABLET | Freq: Every day | ORAL | 3 refills | Status: DC

## 2022-04-03 MED ORDER — CARVEDILOL 3.125 MG PO TABS: 3.1250 mg | ORAL_TABLET | Freq: Two times a day (BID) | ORAL | 3 refills | Status: DC

## 2022-04-03 MED ORDER — PANTOPRAZOLE SODIUM 40 MG PO TBEC: 40.0000 mg | DELAYED_RELEASE_TABLET | Freq: Two times a day (BID) | ORAL | 3 refills | Status: DC

## 2022-04-03 NOTE — Telephone Encounter (Signed)
Per OV note by Lenze on 01/17/22:  Recommendations: Continue DAPT with ASA and Plavix for one month and can then stop ASA since she will also be on Eliquis. She remains volume overload. Will need continued diuresis with caution given renal insufficiency.   ICM EF 30-35%  no ACEI/ARB/Entresto due to renal function. On carvedilol. Well compensated today, almost appeaers dehydrated. Sees renal today so won't draw labs and they will adjust lasix.Plan f/u echo 3 months.   HLD-LDL 58 on atorvastatin.  No medication changes made at visit, protonix not specifically addressed in note. Will send refills in on requested medications. Pt made aware. Has appt with Dr Harrington Challenger on 04/17/22.

## 2022-04-03 NOTE — Telephone Encounter (Signed)
Patient wants to talk about medication they gave her at the hospital when she had her heart attack back in March.  She wants to know if she needs to continue taking Carvedilol, atorvastatin, pantoprazole, clopidogrel. She said if she does, she needs refills for them.

## 2022-04-03 NOTE — Telephone Encounter (Signed)
Information about medications has been given by cardiology.

## 2022-04-03 NOTE — Telephone Encounter (Signed)
Pt called asking if she needed to continue taking the medication she was prescribed in reference to her hospital visit on 3.24.23.

## 2022-04-05 NOTE — Progress Notes (Unsigned)
Subjective:   [name] is a 86 y.o. female who presents for Medicare Annual (Subsequent) preventive examination.  Review of Systems     Cardiac Risk Factors include: advanced age (>51mn, >>63women);hypertension;diabetes mellitus;dyslipidemia     Objective:    Today's Vitals   04/06/22 0907  BP: 122/80  Pulse: 75  Resp: 16  Temp: 98 F (36.7 C)  SpO2: 99%  Weight: 168 lb 6.4 oz (76.4 kg)  Height: _0  (1.702 m)   Body mass index is 26.38 kg/m.     04/06/2022    9:05 AM 01/23/2022    6:04 PM 12/23/2021    4:10 AM 12/20/2019    3:00 AM 07/31/2017   10:12 AM 05/29/2016    8:05 AM 01/29/2015   10:01 AM  Advanced Directives  Does Patient Have a Medical Advance Directive? _1  No No  Would patient like information on creating a medical advance directive? No - Patient declined No - Patient declined Yes (ED - Information included in AVS) No - Patient declined       Current Medications (verified) Outpatient Encounter Medications as of 04/06/2022  Medication Sig   acetaminophen (TYLENOL) 325 MG tablet Take 650 mg by mouth every 6 (six) hours as needed for pain.   albuterol (PROAIR HFA) 108 (90 Base) MCG/ACT inhaler 2 puffs every 4 hours as needed only  if your can't catch your breath   albuterol (PROVENTIL) (2.5 MG/3ML) 0.083% nebulizer solution Take 3 mLs (2.5 mg total) by nebulization every 6 (six) hours as needed for wheezing or shortness of breath.   Ascorbic Acid (VITAMIN C) 1000 MG tablet Take 1,000 mg by mouth daily.   atorvastatin (LIPITOR) 80 MG tablet Take 1 tablet (80 mg total) by mouth daily.   azithromycin (ZITHROMAX) 250 MG tablet Take 2 on day one then 1 daily x 4 days   BD VEO INSULIN SYRINGE U/F 31G X 15/64" 0.5 ML MISC Inject into the skin 2 (two) times daily.   Blood Glucose Monitoring Suppl (ONETOUCH VERIO FLEX SYSTEM) w/Device KIT USE AS DIRECTED   budesonide-formoterol (SYMBICORT) 160-4.5 MCG/ACT inhaler TAKE 2 PUFFS FIRST THING IN AM AND THEN  ANOTHER 2 PUFFS ABOUT 12 HOURS LATER.   carvedilol (COREG) 3.125 MG tablet Take 1 tablet (3.125 mg total) by mouth 2 (two) times daily with a meal.   cetirizine (ZYRTEC ALLERGY) 10 MG tablet Take 1 tablet (10 mg total) by mouth daily as needed for allergies.   clopidogrel (PLAVIX) 75 MG tablet Take 1 tablet (75 mg total) by mouth daily with breakfast.   diclofenac Sodium (VOLTAREN) 1 % GEL Apply 4 g topically 4 (four) times daily as needed (pain).   ELIQUIS 2.5 MG TABS tablet TAKE 1 TABLET BY MOUTH TWICE A DAY   famotidine (PEPCID) 20 MG tablet Take 1 tablet (20 mg total) by mouth 2 (two) times daily.   furosemide (LASIX) 40 MG tablet TAKE 1 TABLET BY MOUTH TWICE A DAY (Patient taking differently: Take 40 mg by mouth daily.)   glucose blood (ONETOUCH VERIO) test strip 1 each by Other route 2 (two) times daily. And lancets 2/day   glucose blood test strip Check blood sugar twice a day   hydrALAZINE (APRESOLINE) 25 MG tablet TAKE 3 TABLETS (75 MG TOTAL) BY MOUTH 3 (THREE) TIMES DAILY.   insulin lispro (HUMALOG) 100 UNIT/ML injection Give 3 units with BREAKFAST, AND 5 units with SUPPER   latanoprost (XALATAN) 0.005 % ophthalmic solution  Place 1 drop into both eyes at bedtime.   montelukast (SINGULAIR) 10 MG tablet TAKE 1 TABLET BY MOUTH EVERYDAY AT BEDTIME   nitroGLYCERIN (NITROSTAT) 0.4 MG SL tablet Place 1 tablet (0.4 mg total) under the tongue every 5 (five) minutes x 3 doses as needed for chest pain.   pantoprazole (PROTONIX) 40 MG tablet Take 1 tablet (40 mg total) by mouth 2 (two) times daily.   potassium chloride (KLOR-CON) 10 MEQ tablet TAKE 1 TABLET BY MOUTH EVERY DAY   pravastatin (PRAVACHOL) 40 MG tablet Take 40 mg by mouth daily.   Syringe/Needle, Disp, (SYRINGE 3CC/25GX1") 25G X 1" 3 ML MISC Use to inject into the skin 2x a day   traMADol (ULTRAM) 50 MG tablet Take 50 mg by mouth every 6 (six) hours as needed for moderate pain.   [DISCONTINUED] aspirin 81 MG EC tablet Take 1 tablet (81  mg total) by mouth daily. Swallow whole.   Facility-Administered Encounter Medications as of 04/06/2022  Medication   ipratropium-albuterol (DUONEB) 0.5-2.5 (3) MG/3ML nebulizer solution 3 mL    Allergies (verified) Aspirin and Ramipril   History: Past Medical History:  Diagnosis Date   Allergic rhinitis    Anterior chest wall pain    Anxiety    Asthma    Chronic diastolic CHF (congestive heart failure) ([place])    Echo 01/2020: EF 60-65, normal wall motion, mild LVH, normal RV SF, RVSP 52.6 (moderate elevation), severe LAE, moderate RAE, trivial MR, mild MS (mean gradient 5.5 mmHg), mild aortic valve sclerosis (no AS); elevated E/e' c/w elevated LVEDP   Cough    DM type 2 (diabetes mellitus, type 2) (HCC)    GERD (gastroesophageal reflux disease)    History of diverticulitis of colon    HTN (hypertension)    Hyperlipidemia    IBS (irritable bowel syndrome)    Iron deficiency anemia    Osteoporosis, unspecified    Renal insufficiency 07/31/2017   UTI (urinary tract infection)    Past Surgical History:  Procedure Laterality Date   CARDIOVASCULAR STRESS TEST  02/25/04   CORONARY BALLOON ANGIOPLASTY N/A 12/28/2021   Procedure: CORONARY BALLOON ANGIOPLASTY;  Surgeon: Burnell Blanks, MD;  Location: [facility];  Service: Cardiovascular;  Laterality: N/A;   CORONARY STENT INTERVENTION N/A 12/28/2021   Procedure: CORONARY STENT INTERVENTION;  Surgeon: Burnell Blanks, MD;  Location: [facility] [place] [facility];  Service: Cardiovascular;  Laterality: N/A;   ESOPHAGOGASTRODUODENOSCOPY  12/26/01   PACEMAKER IMPLANT N/A 08/02/2020   Procedure: PACEMAKER IMPLANT;  Surgeon: Deboraha Sprang, MD;  Location: [facility];  Service: Cardiovascular;  Laterality: N/A;   RIGHT OOPHORECTOMY  jan 2010   RIGHT/LEFT HEART CATH AND CORONARY ANGIOGRAPHY N/A 12/28/2021   Procedure: RIGHT/LEFT HEART CATH AND CORONARY ANGIOGRAPHY;  Surgeon: Burnell Blanks, MD;  Location: [place]  [facility];  Service: Cardiovascular;  Laterality: N/A;   Family History  Problem Relation Age of Onset   Lung cancer Brother    Melanoma Brother    Hypertension Mother    Stroke Mother    Other Father        poor circulation   Cancer Sister    Diabetes Daughter    Atopy Neg Hx    Asthma Neg Hx    Breast cancer Neg Hx    Social History   Socioeconomic History   Marital status: Widowed    Spouse name: Not on file   Number of children: Not on file  Years of education: Not on file   Highest education level: Not on file  Occupational History   Occupation: retired  Tobacco Use   Smoking status: Former    Packs/day: 0.30    Years: 5.00    Total pack years: 1.50    Types: Cigarettes    Quit date: 10/02/1986    Years since quitting: 35.5   Smokeless tobacco: Never  Vaping Use   Vaping Use: Never used  Substance and Sexual Activity   Alcohol use: No   Drug use: No   Sexual activity: Not on file  Other Topics Concern   Not on file  Social History Narrative   Not on file   Social Determinants of Health   Financial Resource Strain: Low Risk  (04/06/2022)   Overall Financial Resource Strain (CARDIA)    Difficulty of Paying Living Expenses: Not hard at all  Food Insecurity: No Food Insecurity (04/06/2022)   Hunger Vital Sign    Worried About Running Out of Food in the Last Year: Never true    [place] in the Last Year: Never true  Transportation Needs: No Transportation Needs (04/06/2022)   PRAPARE - Hydrologist (Medical): No    Lack of Transportation (Non-Medical): No  Physical Activity: Insufficiently Active (04/06/2022)   Exercise Vital Sign    Days of Exercise per Week: 7 days    Minutes of Exercise per Session: 10 min  Stress: No Stress Concern Present (04/06/2022)   Contin[place]    Feeling of Stress : Not at all  Social Connections: Moderately Integrated (04/06/2022)   Social  Connection and Isolation Panel [NHANES]    Frequency of Communication with Friends and Family: More than three times a week    Frequency of Social Gatherings with Friends and Family: More than three times a week    Attends Religious Services: More than 4 times per year    Active Member of [employer] or Organizations: Yes    Attends Archivist Meetings: More than 4 times per year    Marital Status: Widowed    Tobacco Counseling Counseling given: Not Answered   Clinical Intake:  Pre-visit preparation completed: Yes  Pain : No/denies pain     BMI - recorded: 26.38 Nutritional Status: BMI 25 -29 Overweight Nutritional Risks: None Diabetes: Yes CBG done?: No Did pt. bring in CBG monitor from home?: No     Diabetic?yes Nutrition Risk Assessment:  Has the patient had any N/V/D within the last 2 months?  No  Does the patient have any non-healing wounds?  No  Has the patient had any unintentional weight loss or weight gain?  No   Diabetes:  Is the patient diabetic?  Yes  If diabetic, was a CBG obtained today?  No  Did the patient bring in their glucometer from home?  No  How often do you monitor your CBG's? N/A.   Financial Strains and Diabetes Management:  Are you having any financial strains with the device, your supplies or your medication? No .  Does the patient want to be seen by Chronic Care Management for management of their diabetes?  No  Would the patient like to be referred to a Nutritionist or for Diabetic Management?  No   Diabetic Exams:  Diabetic Eye Exam: Overdue for diabetic eye exam. Pt has been advised about the importance in completing this exam. Patient advised to call and schedule  an eye exam. Diabetic Foot Exam: Completed 03/10/22    Interpreter Needed?: No  Information entered by :: [place] of Daily Living    04/06/2022    9:10 AM 01/04/2022    2:34 PM  In your present state of health, do you have any difficulty  performing the following activities:  Hearing? 0   Vision? 0   Difficulty concentrating or making decisions? 0   Walking or climbing stairs? 0   Dressing or bathing? 0   Doing errands, shopping? 0 0  Preparing Food and eating ? N   Using the Toilet? N   In the past six months, have you accidently leaked urine? N   Do you have problems with loss of bowel control? N   Managing your Medications? N   Managing your Finances? N   Housekeeping or managing your Housekeeping? N     Patient Care Team: [name], [place] as PCP - General (Internal Medicine) Fay Records, MD as PCP - Cardiology (Cardiology) Richmond Campbell, MD as Attending Physician (Gastroenterology) Netta Cedars, MD as Attending Physician (Orthopedic Surgery) Tanda Rockers, MD as Attending Physician (Pulmonary Disease) Rutherford Guys, MD as Consulting Physician (Ophthalmology) Luretha Rued, RN as [place] any recent Medical Services you may have received from other than [facility] providers in the past year (date may be approximate).     Assessment:   This is a routine wellness examination for [facility].  Hearing/Vision screen No results found.  Dietary issues and exercise activities discussed: Current Exercise Habits: Home exercise routine, Type of exercise: stretching;walking, Time (Minutes): 10, Frequency (Times/Week): 7, Weekly Exercise (Minutes/Week): 70, Intensity: Mild, Exercise limited by: None identified   Goals Addressed   None    Depression Screen    04/06/2022    9:05 AM 01/24/2022    1:03 PM 09/21/2020    8:54 AM 08/27/2019   11:01 AM 07/08/2018    8:54 AM  PHQ 2/9 Scores  PHQ - 2 Score 0 0 0 0 0    Fall Risk    04/06/2022    9:05 AM 01/24/2022    1:03 PM 01/12/2021   11:19 AM 09/21/2020    8:53 AM 06/12/2019    8:05 AM  [place] in the past year? 0 0  0 0  Number falls in past yr: 0 0 0 0   Injury with Fall? 0 0 0 0   Risk for fall due  to : No Fall Risks No Fall Risks No Fall Risks    Follow up Falls evaluation completed Falls evaluation completed Falls evaluation completed  Falls evaluation completed    [place]:  Any stairs in or around the home? No  If so, are there any without handrails?  N/a Home free of loose throw rugs in walkways, pet beds, electrical cords, etc? Yes  Adequate lighting in your home to reduce risk of falls? Yes   ASSISTIVE DEVICES UTILIZED TO PREVENT FALLS:  Life alert? No  Use of a cane, walker or w/c? No  Grab bars in the bathroom? No  Shower chair or bench in shower? No  Elevated toilet seat or a handicapped toilet? No   TIMED UP AND GO:  Was the test performed? Yes .  Length of time to ambulate 10 feet: 15 sec.    Gait slow and steady without use of assistive device Cognitive Function:  04/06/2022    9:21 AM  6CIT Screen  What Year? 0 points  What month? 0 points  What time? 0 points  Count back from 20 2 points  Months in reverse 0 points  Repeat phrase 4 points  Total Score 6 points    Immunizations Immunization History  Administered Date(s) Administered   Fluad Quad(high Dose 65+) 06/12/2019, 10/08/2020, 09/28/2021   Influenza Split 07/17/2011, 07/05/2012   Influenza Whole 07/22/2008   Influenza, High Dose Seasonal PF 07/08/2018, 06/12/2019   Influenza,inj,Quad PF,6+ Mos 07/18/2013, 06/16/2014   Influenza-Unspecified 07/17/2011, 07/05/2012, 07/18/2013, 06/16/2014, 07/08/2018   PFIZER(Purple Top)SARS-COV-2 Vaccination 05/05/2020, 06/04/2020   Pneumococcal Conjugate-13 04/12/2016   Pneumococcal Polysaccharide-23 10/02/2006   Td 08/02/1998    TDAP status: Due, Education has been provided regarding the importance of this vaccine. Advised may receive this vaccine at local pharmacy or Health Dept. Aware to provide a copy of the vaccination record if obtained from local pharmacy or Health Dept. Verbalized acceptance and  understanding.  Flu Vaccine status: Up to date  Pneumococcal vaccine status: Up to date  Covid-19 vaccine status: Information provided on how to obtain vaccines.   Qualifies for Shingles Vaccine? Yes   Zostavax completed No   Shingrix Completed?: No.    Education has been provided regarding the importance of this vaccine. Patient has been advised to call insurance company to determine out of pocket expense if they have not yet received this vaccine. Advised may also receive vaccine at local pharmacy or Health Dept. Verbalized acceptance and understanding.  Screening Tests Health Maintenance  Topic Date Due   Zoster Vaccines- Shingrix (1 of 2) Never done   TETANUS/TDAP  08/02/2008   COVID-19 Vaccine (3 - Pfizer risk series) 07/02/2020   OPHTHALMOLOGY EXAM  09/10/2020   INFLUENZA VACCINE  05/02/2022   HEMOGLOBIN A1C  06/25/2022   FOOT EXAM  03/11/2023   Pneumonia Vaccine 85+ Years old  Completed   DEXA SCAN  Completed   HPV VACCINES  Aged Out    Health Maintenance  Health Maintenance Due  Topic Date Due   Zoster Vaccines- Shingrix (1 of 2) Never done   TETANUS/TDAP  08/02/2008   COVID-19 Vaccine (3 - Pfizer risk series) 07/02/2020   OPHTHALMOLOGY EXAM  09/10/2020    Colorectal cancer screening: No longer required.   Mammogram status: Completed 07/15/21. Repeat every year  Bone Density status: Ordered declined. Pt provided with contact info and advised to call to schedule appt.  Lung Cancer Screening: (Low Dose CT Chest recommended if Age 90-80 years, 30 pack-year currently smoking OR have quit w/in 15years.) does not qualify.   Lung Cancer Screening Referral: n/a  Additional Screening:  Hepatitis C Screening: does not qualify; Completed aged out  Vision Screening: Recommended annual ophthalmology exams for early detection of glaucoma and other disorders of the eye. Is the patient up to date with their annual eye exam?  Yes  Who is the provider or what is the name of  the office in which the patient attends annual eye exams? Dr. Loran Senters If pt is not established with a provider, would they like to be referred to a provider to establish care? No .   Dental Screening: Recommended annual dental exams for proper oral hygiene  Community Resource Referral / Chronic Care Management: CRR required this visit?  No   CCM required this visit?  No      Plan:     I have personally reviewed and noted the following in the patient's  chart:   Medical and social history Use of alcohol, tobacco or illicit drugs  Current medications and supplements including opioid prescriptions.  Functional ability and status Nutritional status Physical activity Advanced directives List of other physicians Hospitalizations, surgeries, and ER visits in previous 12 months Vitals Screenings to include cognitive, depression, and falls Referrals and appointments  In addition, I have reviewed and discussed with patient certain preventive protocols, quality metrics, and best practice recommendations. A written personalized care plan for preventive services as well as general preventive health recommendations were provided to patient.     Duard Brady Arya Boxley, [place]   04/06/2022   Nurse Notes: none   Medical screening examination/treatment/procedure(s) were performed by non-physician practitioner and as supervising provider I was immediately available for consultation/collaboration.  I agree with above. [name], FNP

## 2022-04-06 ENCOUNTER — Ambulatory Visit (INDEPENDENT_AMBULATORY_CARE_PROVIDER_SITE_OTHER): Payer: Medicare Other

## 2022-04-06 VITALS — BP 122/80 | HR 75 | Temp 98.0°F | Resp 16 | Ht 67.0 in | Wt 168.4 lb

## 2022-04-06 DIAGNOSIS — Z0000 Encounter for general adult medical examination without abnormal findings: Secondary | ICD-10-CM | POA: Diagnosis not present

## 2022-04-06 NOTE — Patient Instructions (Signed)
[name] , Thank you for taking time to come for your Medicare Wellness Visit. I appreciate your ongoing commitment to your health goals. Please review the following plan we discussed and let me know if I can assist you in the future.   Screening recommendations/referrals: Colonoscopy: no longer needed Mammogram: 07/15/21 Bone Density: declined Recommended yearly ophthalmology/optometry visit for glaucoma screening and checkup Recommended yearly dental visit for hygiene and checkup  Vaccinations: Influenza vaccine: up to date Pneumococcal vaccine: up to date Tdap vaccine: Due-May obtain vaccine at our office or your local pharmacy.  Shingles vaccine: Due-May obtain vaccine at our office or your local pharmacy.    Covid-19:Due-May obtain vaccine at your local pharmacy.   Advanced directives: no  Conditions/risks identified: see problem list   Next appointment: Follow up in one year for your annual wellness visit    Preventive Care 65 Years and Older, Female Preventive care refers to lifestyle choices and visits with your health care provider that can promote health and wellness. What does preventive care include? A yearly physical exam. This is also called an annual well check. Dental exams once or twice a year. Routine eye exams. Ask your health care provider how often you should have your eyes checked. Personal lifestyle choices, including: Daily care of your teeth and gums. Regular physical activity. Eating a healthy diet. Avoiding tobacco and drug use. Limiting alcohol use. Practicing safe sex. Taking low-dose aspirin every day. Taking vitamin and mineral supplements as recommended by your health care provider. What happens during an annual well check? The services and screenings done by your health care provider during your annual well check will depend on your age, overall health, lifestyle risk factors, and family history of disease. Counseling  Your health care  provider may ask you questions about your: Alcohol use. Tobacco use. Drug use. Emotional well-being. Home and relationship well-being. Sexual activity. Eating habits. History of falls. Memory and ability to understand (cognition). Work and work Statistician. Reproductive health. Screening  You may have the following tests or measurements: Height, weight, and BMI. Blood pressure. Lipid and cholesterol levels. These may be checked every 5 years, or more frequently if you are over 84 years old. Skin check. Lung cancer screening. You may have this screening every year starting at age 84 if you have a 30-pack-year history of smoking and currently smoke or have quit within the past 15 years. Fecal occult blood test (FOBT) of the stool. You may have this test every year starting at age 84. Flexible sigmoidoscopy or colonoscopy. You may have a sigmoidoscopy every 5 years or a colonoscopy every 10 years starting at age 84. Hepatitis C blood test. Hepatitis B blood test. Sexually transmitted disease (STD) testing. Diabetes screening. This is done by checking your blood sugar (glucose) after you have not eaten for a while (fasting). You may have this done every 1-3 years. Bone density scan. This is done to screen for osteoporosis. You may have this done starting at age 84. Mammogram. This may be done every 1-2 years. Talk to your health care provider about how often you should have regular mammograms. Talk with your health care provider about your test results, treatment options, and if necessary, the need for more tests. Vaccines  Your health care provider may recommend certain vaccines, such as: Influenza vaccine. This is recommended every year. Tetanus, diphtheria, and acellular pertussis (Tdap, Td) vaccine. You may need a Td booster every 10 years. Zoster vaccine. You may need this after age  84. Pneumococcal 13-valent conjugate (PCV13) vaccine. One dose is recommended after age  84. Pneumococcal polysaccharide (PPSV23) vaccine. One dose is recommended after age 84. Talk to your health care provider about which screenings and vaccines you need and how often you need them. This information is not intended to replace advice given to you by your health care provider. Make sure you discuss any questions you have with your health care provider. Document Released: 10/15/2015 Document Revised: 06/07/2016 Document Reviewed: 07/20/2015 Elsevier Interactive Patient Education  2017 [place] Prevention in the Home Falls can cause injuries. They can happen to people of all ages. There are many things you can do to make your home safe and to help prevent falls. What can I do on the outside of my home? Regularly fix the edges of walkways and driveways and fix any cracks. Remove anything that might make you trip as you walk through a door, such as a raised step or threshold. Trim any bushes or trees on the path to your home. Use bright outdoor lighting. Clear any walking paths of anything that might make someone trip, such as rocks or tools. Regularly check to see if handrails are loose or broken. Make sure that both sides of any steps have handrails. Any raised decks and porches should have guardrails on the edges. Have any leaves, snow, or ice cleared regularly. Use sand or salt on walking paths during winter. Clean up any spills in your garage right away. This includes oil or grease spills. What can I do in the bathroom? Use night lights. Install grab bars by the toilet and in the tub and shower. Do not use towel bars as grab bars. Use non-skid mats or decals in the tub or shower. If you need to sit down in the shower, use a plastic, non-slip stool. Keep the floor dry. Clean up any water that spills on the floor as soon as it happens. Remove soap buildup in the tub or shower regularly. Attach bath mats securely with double-sided non-slip rug tape. Do not have throw  rugs and other things on the floor that can make you trip. What can I do in the bedroom? Use night lights. Make sure that you have a light by your bed that is easy to reach. Do not use any sheets or blankets that are too big for your bed. They should not hang down onto the floor. Have a firm chair that has side arms. You can use this for support while you get dressed. Do not have throw rugs and other things on the floor that can make you trip. What can I do in the kitchen? Clean up any spills right away. Avoid walking on wet floors. Keep items that you use a lot in easy-to-reach places. If you need to reach something above you, use a strong step stool that has a grab bar. Keep electrical cords out of the way. Do not use floor polish or wax that makes floors slippery. If you must use wax, use non-skid floor wax. Do not have throw rugs and other things on the floor that can make you trip. What can I do with my stairs? Do not leave any items on the stairs. Make sure that there are handrails on both sides of the stairs and use them. Fix handrails that are broken or loose. Make sure that handrails are as long as the stairways. Check any carpeting to make sure that it is firmly attached to the stairs. Fix  any carpet that is loose or worn. Avoid having throw rugs at the top or bottom of the stairs. If you do have throw rugs, attach them to the floor with carpet tape. Make sure that you have a light switch at the top of the stairs and the bottom of the stairs. If you do not have them, ask someone to add them for you. What else can I do to help prevent falls? Wear shoes that: Do not have high heels. Have rubber bottoms. Are comfortable and fit you well. Are closed at the toe. Do not wear sandals. If you use a stepladder: Make sure that it is fully opened. Do not climb a closed stepladder. Make sure that both sides of the stepladder are locked into place. Ask someone to hold it for you, if  possible. Clearly mark and make sure that you can see: Any grab bars or handrails. First and last steps. Where the edge of each step is. Use tools that help you move around (mobility aids) if they are needed. These include: Canes. Walkers. Scooters. Crutches. Turn on the lights when you go into a dark area. Replace any light bulbs as soon as they burn out. Set up your furniture so you have a clear path. Avoid moving your furniture around. If any of your floors are uneven, fix them. If there are any pets around you, be aware of where they are. Review your medicines with your doctor. Some medicines can make you feel dizzy. This can increase your chance of falling. Ask your doctor what other things that you can do to help prevent falls. This information is not intended to replace advice given to you by your health care provider. Make sure you discuss any questions you have with your health care provider. Document Released: 07/15/2009 Document Revised: 02/24/2016 Document Reviewed: 10/23/2014 Elsevier Interactive Patient Education  2017 [employer].

## 2022-04-07 ENCOUNTER — Other Ambulatory Visit: Payer: Self-pay | Admitting: Internal Medicine

## 2022-04-12 ENCOUNTER — Telehealth: Payer: Self-pay | Admitting: Internal Medicine

## 2022-04-12 NOTE — Telephone Encounter (Signed)
Pt states that she is flying out of town on Friday and would like to know if she is ok to fly. Please advise

## 2022-04-13 NOTE — Telephone Encounter (Signed)
I saw the pt in clinic in October 2022   She was doing OK from a cardiac standpoint at that time  Based on this she is OK to fly

## 2022-04-13 NOTE — Telephone Encounter (Signed)
Pt advised that it is safe to fly per Dr Harrington Challenger review from a cardiac standpoint.

## 2022-04-17 ENCOUNTER — Ambulatory Visit: Payer: Medicare Other | Admitting: Internal Medicine

## 2022-04-17 ENCOUNTER — Encounter: Payer: Self-pay | Admitting: Physician Assistant

## 2022-04-17 ENCOUNTER — Ambulatory Visit (HOSPITAL_COMMUNITY): Payer: Medicare Other | Attending: Internal Medicine

## 2022-04-17 DIAGNOSIS — I255 Ischemic cardiomyopathy: Secondary | ICD-10-CM | POA: Diagnosis not present

## 2022-04-17 DIAGNOSIS — I050 Rheumatic mitral stenosis: Secondary | ICD-10-CM

## 2022-04-17 HISTORY — DX: Rheumatic mitral stenosis: I05.0

## 2022-04-17 LAB — ECHOCARDIOGRAM COMPLETE
Area-P 1/2: 1.97 cm2
MV VTI: 1.11 cm2
S' Lateral: 3.9 cm

## 2022-04-18 ENCOUNTER — Other Ambulatory Visit: Payer: Self-pay | Admitting: *Deleted

## 2022-04-18 DIAGNOSIS — I050 Rheumatic mitral stenosis: Secondary | ICD-10-CM

## 2022-04-21 ENCOUNTER — Ambulatory Visit (HOSPITAL_BASED_OUTPATIENT_CLINIC_OR_DEPARTMENT_OTHER)
Admission: RE | Admit: 2022-04-21 | Discharge: 2022-04-21 | Disposition: A | Discharge status: Home / Self Care | Payer: Medicare Other | Source: Ambulatory Visit | Attending: Family | Admitting: Family

## 2022-04-21 ENCOUNTER — Ambulatory Visit (INDEPENDENT_AMBULATORY_CARE_PROVIDER_SITE_OTHER): Payer: Medicare Other | Admitting: Family

## 2022-04-21 VITALS — BP 132/70 | HR 82 | Temp 98.1°F | Resp 18 | Ht 67.0 in | Wt 169.4 lb

## 2022-04-21 DIAGNOSIS — M79671 Pain in right foot: Secondary | ICD-10-CM

## 2022-04-21 DIAGNOSIS — M19071 Primary osteoarthritis, right ankle and foot: Secondary | ICD-10-CM | POA: Diagnosis not present

## 2022-04-21 DIAGNOSIS — E119 Type 2 diabetes mellitus without complications: Secondary | ICD-10-CM | POA: Diagnosis not present

## 2022-04-21 DIAGNOSIS — M7731 Calcaneal spur, right foot: Secondary | ICD-10-CM | POA: Diagnosis not present

## 2022-04-21 NOTE — Progress Notes (Signed)
[name] is a 86 y.o. female with the following history as recorded in EpicCare:  Patient Active Problem List   Diagnosis Date Noted   Mitral stenosis 04/17/2022   Acute kidney injury superimposed on chronic kidney disease ([place]) 01/02/2022   NSTEMI (non-ST elevated myocardial infarction) ([place]) 12/23/2021   Osteoarthritis of knee 03/30/2021   Bilateral hand pain 03/08/2021   Complete heart block ([place]) 12/13/2020   Pacemaker - MDT 12/13/2020   SOB (shortness of breath) 07/29/2020   Encntr for surgical aftcr following surgery on the circ sys 07/26/2020   Pain in right knee 03/17/2020   Chronic combined systolic (congestive) and diastolic (congestive) heart failure ([place])    DKA (diabetic ketoacidoses) 12/20/2019   Diverticulosis 12/10/2019   Age-related osteoporosis without current pathological fracture 10/03/2019   Chronic kidney disease, stage 3 unspecified ([place]) 10/03/2019   Long term (current) use of insulin ([place]) 10/03/2019   Hyperlipidemia, unspecified 10/03/2019   Shortness of breath 01/30/2018   Acute bronchiolitis 12/17/2017   Wheezing 10/29/2017   Vitamin D deficiency 07/31/2017   Renal insufficiency 07/31/2017   Knee pain 06/01/2017   Morbid obesity due to excess calories ([place]) 01/25/2017   Diabetes ([place]) 04/12/2016   Cough 01/20/2015   Atrial fibrillation ([place]) 05/28/2014   Personal history of colonic polyps 05/28/2014   Vomiting 01/29/2014   CAD (coronary artery disease) 09/01/2013   Routine general medical examination at a health care facility 07/17/2011   Encounter for long-term (current) use of other medications 07/06/2011   Nonspecific (abnormal) findings on radiological and other examination of body structure 11/09/2009   IRRITABLE BOWEL SYNDROME 05/07/2009   CHEST PAIN 05/07/2009   ADNEXAL MASS, RIGHT 07/22/2008   HEMORRHOIDS, RECURRENT 01/27/2008   Diverticulitis 01/27/2008   OTH ABNORMAL FIND RAD EXAMINATION BREAST 01/27/2008   GERD 01/13/2008   UTI  09/28/2007   Dyslipidemia 05/27/2007   ANEMIA-IRON DEFICIENCY 05/27/2007   ANXIETY 05/27/2007   Essential hypertension 05/27/2007   Seasonal allergic rhinitis 05/27/2007   Cough variant asthma 05/27/2007   Osteoporosis 05/27/2007   DIVERTICULITIS, HX OF 05/27/2007    Current Outpatient Medications  Medication Sig Dispense Refill   acetaminophen (TYLENOL) 325 MG tablet Take 650 mg by mouth every 6 (six) hours as needed for pain.     albuterol (PROAIR HFA) 108 (90 Base) MCG/ACT inhaler 2 puffs every 4 hours as needed only  if your can't catch your breath 18 g 11   albuterol (PROVENTIL) (2.5 MG/3ML) 0.083% nebulizer solution Take 3 mLs (2.5 mg total) by nebulization every 6 (six) hours as needed for wheezing or shortness of breath. 150 mL 1   Ascorbic Acid (VITAMIN C) 1000 MG tablet Take 1,000 mg by mouth daily.     atorvastatin (LIPITOR) 80 MG tablet Take 1 tablet (80 mg total) by mouth daily. 90 tablet 3   BD VEO INSULIN SYRINGE U/F 31G X 15/64" 0.5 ML MISC Inject into the skin 2 (two) times daily.     Blood Glucose Monitoring Suppl (ONETOUCH VERIO FLEX SYSTEM) w/Device KIT USE AS DIRECTED 1 kit .   budesonide-formoterol (SYMBICORT) 160-4.5 MCG/ACT inhaler TAKE 2 PUFFS FIRST THING IN AM AND THEN ANOTHER 2 PUFFS ABOUT 12 HOURS LATER. 30.6 each 5   carvedilol (COREG) 3.125 MG tablet Take 1 tablet (3.125 mg total) by mouth 2 (two) times daily with a meal. 180 tablet 3   cetirizine (ZYRTEC ALLERGY) 10 MG tablet Take 1 tablet (10 mg total) by mouth daily as needed for allergies.  clopidogrel (PLAVIX) 75 MG tablet Take 1 tablet (75 mg total) by mouth daily with breakfast. 90 tablet 3   diclofenac Sodium (VOLTAREN) 1 % GEL Apply 4 g topically 4 (four) times daily as needed (pain).     ELIQUIS 2.5 MG TABS tablet TAKE 1 TABLET BY MOUTH TWICE A DAY 60 tablet 6   famotidine (PEPCID) 20 MG tablet Take 1 tablet (20 mg total) by mouth 2 (two) times daily. 20 tablet 0   furosemide (LASIX) 40 MG tablet  TAKE 1 TABLET BY MOUTH TWICE A DAY 60 tablet 1   glucose blood (ONETOUCH VERIO) test strip 1 each by Other route 2 (two) times daily. And lancets 2/day 200 each 3   glucose blood test strip Check blood sugar twice a day 100 each 12   hydrALAZINE (APRESOLINE) 25 MG tablet TAKE 3 TABLETS (75 MG TOTAL) BY MOUTH 3 (THREE) TIMES DAILY. 810 tablet 2   insulin lispro (HUMALOG) 100 UNIT/ML injection Give 3 units with BREAKFAST, AND 5 units with SUPPER 60 mL 1   latanoprost (XALATAN) 0.005 % ophthalmic solution Place 1 drop into both eyes at bedtime.     montelukast (SINGULAIR) 10 MG tablet TAKE 1 TABLET BY MOUTH EVERYDAY AT BEDTIME 90 tablet 3   nitroGLYCERIN (NITROSTAT) 0.4 MG SL tablet Place 1 tablet (0.4 mg total) under the tongue every 5 (five) minutes x 3 doses as needed for chest pain. 25 tablet 2   pantoprazole (PROTONIX) 40 MG tablet Take 1 tablet (40 mg total) by mouth 2 (two) times daily. 180 tablet 3   potassium chloride (KLOR-CON) 10 MEQ tablet TAKE 1 TABLET BY MOUTH EVERY DAY 90 tablet 2   pravastatin (PRAVACHOL) 40 MG tablet Take 40 mg by mouth daily.     Syringe/Needle, Disp, (SYRINGE 3CC/25GX1") 25G X 1" 3 ML MISC Use to inject into the skin 2x a day 100 each 3   traMADol (ULTRAM) 50 MG tablet Take 50 mg by mouth every 6 (six) hours as needed for moderate pain.     Current Facility-Administered Medications  Medication Dose Route Frequency Provider Last Rate Last Admin   ipratropium-albuterol (DUONEB) 0.5-2.5 (3) MG/3ML nebulizer solution 3 mL  3 mL Nebulization Once [name], FNP        Allergies: Aspirin and Ramipril  Past Medical History:  Diagnosis Date   Allergic rhinitis    Anterior chest wall pain    Anxiety    Asthma    Chronic diastolic CHF (congestive heart failure) ([place])    Echo 01/2020: EF 60-65, normal wall motion, mild LVH, normal RV SF, RVSP 52.6 (moderate elevation), severe LAE, moderate RAE, trivial MR, mild MS (mean gradient 5.5 mmHg), mild aortic valve  sclerosis (no AS); elevated E/e' c/w elevated LVEDP   Cough    DM type 2 (diabetes mellitus, type 2) ([facility])    GERD (gastroesophageal reflux disease)    History of diverticulitis of colon    HTN (hypertension)    Hyperlipidemia    IBS (irritable bowel syndrome)    Iron deficiency anemia    Mitral stenosis 04/17/2022   Echo 04/2022: EF 55-60, no RWMA, mild LVH, normal RVSF, normal PASP, moderate LAE, moderate mitral stenosis (mean gradient 4 mmHg), trivial MR, trivial AI, AV sclerosis without stenosis   Osteoporosis, unspecified    Renal insufficiency 07/31/2017   UTI (urinary tract infection)     Past Surgical History:  Procedure Laterality Date   CARDIOVASCULAR STRESS TEST  02/25/04   CORONARY  BALLOON ANGIOPLASTY N/A 12/28/2021   Procedure: CORONARY BALLOON ANGIOPLASTY;  Surgeon: Burnell Blanks, MD;  Location: [facility];  Service: Cardiovascular;  Laterality: N/A;   CORONARY STENT INTERVENTION N/A 12/28/2021   Procedure: CORONARY STENT INTERVENTION;  Surgeon: Burnell Blanks, MD;  Location: [facility];  Service: Cardiovascular;  Laterality: N/A;   ESOPHAGOGASTRODUODENOSCOPY  12/26/01   PACEMAKER IMPLANT N/A 08/02/2020   Procedure: PACEMAKER IMPLANT;  Surgeon: Deboraha Sprang, MD;  Location: [facility];  Service: Cardiovascular;  Laterality: N/A;   RIGHT OOPHORECTOMY  jan 2010   RIGHT/LEFT HEART CATH AND CORONARY ANGIOGRAPHY N/A 12/28/2021   Procedure: RIGHT/LEFT HEART CATH AND CORONARY ANGIOGRAPHY;  Surgeon: Burnell Blanks, MD;  Location: [facility];  Service: Cardiovascular;  Laterality: N/A;    Family History  Problem Relation Age of Onset   Lung cancer Brother    Melanoma Brother    Hypertension Mother    Stroke Mother    Other Father        poor circulation   Cancer Sister    Diabetes Daughter    Atopy Neg Hx    Asthma Neg Hx    Breast cancer Neg Hx     Social History   Tobacco Use   Smoking status: Former     Packs/day: 0.30    Years: 5.00    Total pack years: 1.50    Types: Cigarettes    Quit date: 10/02/1986    Years since quitting: 35.5   Smokeless tobacco: Never  Substance Use Topics   Alcohol use: No    Subjective:  Accompanied by daughter; notes that she injured 1st toe right foot on door approximately 1 week ago; hit toe on door; continuing to feel pain at tip of toe;     Objective:  Vitals:   04/21/22 1028  BP: 132/70  Pulse: 82  Resp: 18  Temp: 98.1 F (36.7 C)  TempSrc: Temporal  SpO2: 99%  Weight: 169 lb 6.4 oz (76.8 kg)  Height: '5\' 7"'  (1.702 m)    General: Well developed, well nourished, in no acute distress  Skin : Warm and dry. Small blood blister noted on tip of toe Head: Normocephalic and atraumatic  Lungs: Respirations unlabored;  Musculoskeletal: No deformities; no active joint inflammation  Neurologic: Alert and oriented; speech intact; face symmetrical; moves all extremities well; CNII-XII intact without focal deficit   Assessment:  1. Right foot pain     Plan:  Reassurance; encouraged to ice, elevate and use Tylenol as needed; will update X-ray; follow up to be determined.   No follow-ups on file.  Orders Placed This Encounter  Procedures   DG Foot Complete Right    Standing Status:   Future    Number of Occurrences:   1    Standing Expiration Date:   04/22/2023    Order Specific Question:   Reason for Exam (SYMPTOM  OR DIAGNOSIS REQUIRED)    Answer:   foot pain    Order Specific Question:   Preferred imaging location?    Answer:   [facility]    Requested Prescriptions    No prescriptions requested or ordered in this encounter

## 2022-04-23 NOTE — Progress Notes (Signed)
Cardiology Office Note   Date:  04/24/2022   ID:  [name], DOB 1936-05-01, MRN [id number]  PCP:  [name], FNP  Cardiologist:   Dorris Carnes, MD   F/U of PAF, CAD and HTN     History of Present Illness: [name] is a 86 y.o. female with a history of HTN, DM, mod CAD (CT in 2017 with calcium scoreof  51  mod dz LAD; filling defect in LAA), paroxysmal atrial fibrillation  The pt was hospitalized in 2021 with dyspnea.  Found to be in CHB   She is s/p PPM implant, followed by Olin Pia     Patient admitted 12/28/21 with NSTEMI.  She underwent  PTCA/ DES to the  LAD,probably lost her diagonal. ILVEF was 30-35%,  Hospital course was  complicated by AKI, persistent nausea, pneumonia, knee pain with aspiration and injection with cortisone. Crt 4.28 at discharge-followed by renal.   The pt was seen in clinic in APril 2023 by Vanetta Shawl   Since then she says her breathing is OK   She denies CP   No dizziness       Current Meds  Medication Sig   acetaminophen (TYLENOL) 325 MG tablet Take 650 mg by mouth every 6 (six) hours as needed for pain.   albuterol (PROAIR HFA) 108 (90 Base) MCG/ACT inhaler 2 puffs every 4 hours as needed only  if your can't catch your breath   albuterol (PROVENTIL) (2.5 MG/3ML) 0.083% nebulizer solution Take 3 mLs (2.5 mg total) by nebulization every 6 (six) hours as needed for wheezing or shortness of breath.   Ascorbic Acid (VITAMIN C) 1000 MG tablet Take 1,000 mg by mouth daily.   atorvastatin (LIPITOR) 80 MG tablet Take 1 tablet (80 mg total) by mouth daily.   BD VEO INSULIN SYRINGE U/F 31G X 15/64" 0.5 ML MISC Inject into the skin 2 (two) times daily.   Blood Glucose Monitoring Suppl (ONETOUCH VERIO FLEX SYSTEM) w/Device KIT USE AS DIRECTED   budesonide-formoterol (SYMBICORT) 160-4.5 MCG/ACT inhaler TAKE 2 PUFFS FIRST THING IN AM AND THEN ANOTHER 2 PUFFS ABOUT 12 HOURS LATER.   carvedilol (COREG) 3.125 MG tablet Take 1 tablet (3.125 mg total) by mouth 2  (two) times daily with a meal.   cetirizine (ZYRTEC ALLERGY) 10 MG tablet Take 1 tablet (10 mg total) by mouth daily as needed for allergies.   clopidogrel (PLAVIX) 75 MG tablet Take 1 tablet (75 mg total) by mouth daily with breakfast.   diclofenac Sodium (VOLTAREN) 1 % GEL Apply 4 g topically 4 (four) times daily as needed (pain).   ELIQUIS 2.5 MG TABS tablet TAKE 1 TABLET BY MOUTH TWICE A DAY   famotidine (PEPCID) 20 MG tablet Take 1 tablet (20 mg total) by mouth 2 (two) times daily.   furosemide (LASIX) 40 MG tablet TAKE 1 TABLET BY MOUTH TWICE A DAY   glucose blood (ONETOUCH VERIO) test strip 1 each by Other route 2 (two) times daily. And lancets 2/day   glucose blood test strip Check blood sugar twice a day   hydrALAZINE (APRESOLINE) 25 MG tablet TAKE 3 TABLETS (75 MG TOTAL) BY MOUTH 3 (THREE) TIMES DAILY.   insulin lispro (HUMALOG) 100 UNIT/ML injection Give 3 units with BREAKFAST, AND 5 units with SUPPER   latanoprost (XALATAN) 0.005 % ophthalmic solution Place 1 drop into both eyes at bedtime.   montelukast (SINGULAIR) 10 MG tablet TAKE 1 TABLET BY MOUTH EVERYDAY AT BEDTIME  nitroGLYCERIN (NITROSTAT) 0.4 MG SL tablet Place 1 tablet (0.4 mg total) under the tongue every 5 (five) minutes x 3 doses as needed for chest pain.   pantoprazole (PROTONIX) 40 MG tablet Take 1 tablet (40 mg total) by mouth 2 (two) times daily.   potassium chloride (KLOR-CON) 10 MEQ tablet TAKE 1 TABLET BY MOUTH EVERY DAY   pravastatin (PRAVACHOL) 40 MG tablet Take 40 mg by mouth daily.   Syringe/Needle, Disp, (SYRINGE 3CC/25GX1") 25G X 1" 3 ML MISC Use to inject into the skin 2x a day   traMADol (ULTRAM) 50 MG tablet Take 50 mg by mouth every 6 (six) hours as needed for moderate pain.   Current Facility-Administered Medications for the 04/24/22 encounter (Office Visit) with Fay Records, MD  Medication   ipratropium-albuterol (DUONEB) 0.5-2.5 (3) MG/3ML nebulizer solution 3 mL        Allergies:   Aspirin  and Ramipril   Past Medical History:  Diagnosis Date   Allergic rhinitis    Anterior chest wall pain    Anxiety    Asthma    Chronic diastolic CHF (congestive heart failure) ([place])    Echo 01/2020: EF 60-65, normal wall motion, mild LVH, normal RV SF, RVSP 52.6 (moderate elevation), severe LAE, moderate RAE, trivial MR, mild MS (mean gradient 5.5 mmHg), mild aortic valve sclerosis (no AS); elevated E/e' c/w elevated LVEDP   Cough    DM type 2 (diabetes mellitus, type 2) (HCC)    GERD (gastroesophageal reflux disease)    History of diverticulitis of colon    HTN (hypertension)    Hyperlipidemia    IBS (irritable bowel syndrome)    Iron deficiency anemia    Mitral stenosis 04/17/2022   Echo 04/2022: EF 55-60, no RWMA, mild LVH, normal RVSF, normal PASP, moderate LAE, moderate mitral stenosis (mean gradient 4 mmHg), trivial MR, trivial AI, AV sclerosis without stenosis   Osteoporosis, unspecified    Renal insufficiency 07/31/2017   UTI (urinary tract infection)     Past Surgical History:  Procedure Laterality Date   CARDIOVASCULAR STRESS TEST  02/25/04   CORONARY BALLOON ANGIOPLASTY N/A 12/28/2021   Procedure: CORONARY BALLOON ANGIOPLASTY;  Surgeon: Burnell Blanks, MD;  Location: [facility];  Service: Cardiovascular;  Laterality: N/A;   CORONARY STENT INTERVENTION N/A 12/28/2021   Procedure: CORONARY STENT INTERVENTION;  Surgeon: Burnell Blanks, MD;  Location: [facility];  Service: Cardiovascular;  Laterality: N/A;   ESOPHAGOGASTRODUODENOSCOPY  12/26/01   PACEMAKER IMPLANT N/A 08/02/2020   Procedure: PACEMAKER IMPLANT;  Surgeon: Deboraha Sprang, MD;  Location: [facility];  Service: Cardiovascular;  Laterality: N/A;   RIGHT OOPHORECTOMY  jan 2010   RIGHT/LEFT HEART CATH AND CORONARY ANGIOGRAPHY N/A 12/28/2021   Procedure: RIGHT/LEFT HEART CATH AND CORONARY ANGIOGRAPHY;  Surgeon: Burnell Blanks, MD;  Location: [facility];  Service:  Cardiovascular;  Laterality: N/A;     Social History:  The patient  reports that she quit smoking about 35 years ago. Her smoking use included cigarettes. She has a 1.50 pack-year smoking history. She has never used smokeless tobacco. She reports that she does not drink alcohol and does not use drugs.   Family History:  The patient's family history includes Cancer in her sister; Diabetes in her daughter; Hypertension in her mother; Lung cancer in her brother; Melanoma in her brother; Other in her father; Stroke in her mother.    ROS:  Please see the history of  present illness. All other systems are reviewed and  Negative to the above problem except as noted.    PHYSICAL EXAM: VS:  BP 114/66   Pulse 75   Ht '5\' 7"'  (1.702 m)   Wt 169 lb (76.7 kg)   SpO2 99%   BMI 26.47 kg/m   GEN:  Overweight 86 yo  in no acute distress  HEENT: normal  Neck: JVP normal   Cardiac: RRR  ; II/VI sys murmur LSB  GrI/VI diastolic murmur   NO LE edema  Respiratory:  clear to auscultation bilaterally, GI: soft, nontender, nondistended, + BS  No hepatomegaly  MS: no deformity Moving all extremities   Skin: warm and dry, no rash Neuro:  Strength and sensation are intact Psych: euthymic mood, full affect   EKG:  EKG is not ordered today    Echo   04/17/22  ompared to echo from March 2023, LVEF is improved. . Left ventricular ejection fraction, by estimation, is 55 to 60%. The left ventricle has normal function. The left ventricle has no regional wall motion abnormalities. The left ventricular internal cavity size was mildly dilated. There is mild left ventricular hypertrophy. Left ventricular diastolic parameters are indeterminate. 1. Right ventricular systolic function is normal. The right ventricular size is normal. There is normal pulmonary artery systolic pressure. 2. 3. Left atrial size was moderately dilated. MV is thickened with restricted motion. Peak and mean gradients through the valve are  12 and 4 mm Hg MVA (VTI) is calculated at 1.12 cm2 Consistent with moderate MS. (HR 60 bpm) Compared to previous echo mean gradient is mildly decreased (6.5 to 4 mm Hg). Trivial mitral valve regurgitation. Moderate mitral annular calcification. 4. The aortic valve is tricuspid. Aortic valve regurgitation is trivial. Aortic valve sclerosis is present, with no evidence of aortic valve stenosis. 5. The inferior vena cava is normal in size with greater than 50% respiratory variability, suggesting right atrial pressure of 3 mmHg. R and L heart cath  12/28/21    Mid RCA lesion is 30% stenosed.   Dist RCA lesion is 50% stenosed.   1st Diag lesion is 90% stenosed.   Prox LAD to Mid LAD lesion is 95% stenosed.   A drug-eluting stent was successfully placed using a SYNERGY XD 3.0X16.   Balloon angioplasty was performed using a BALLN SAPPHIRE 2.0X12.   Post intervention, there is a 0% residual stenosis.   Post intervention, there is a 30% residual stenosis.   Severe proximal LAD stenosis involving a moderate caliber diagonal branch.  Successful PTCA/DES x 1 proximal LAD Successful balloon angioplasty Diagonal 1 No obstructive disease in the Circumflex artery Moderate non-obstructive disease in the dominant RCA Elevated right and left heart pressures.    Recommendations: Continue DAPT with ASA and Plavix for one month and can then stop ASA since she will also be on Eliquis. She remains volume overload. Will need continued diuresis with caution given renal insufficiency    Lipid Panel    Component Value Date/Time   CHOL 120 12/23/2021 1030   CHOL 135 07/28/2021 0852   TRIG 46 12/23/2021 1030   TRIG 83 08/07/2006 1419   HDL 55 12/23/2021 1030   HDL 64 07/28/2021 0852   CHOLHDL 2.2 12/23/2021 1030   VLDL 9 12/23/2021 1030   LDLCALC 56 12/23/2021 1030   LDLCALC 58 07/28/2021 0852      Wt Readings from Last 3 Encounters:  04/24/22 169 lb (76.7 kg)  04/21/22 169 lb 6.4 oz (76.8  kg)   04/06/22 168 lb 6.4 oz (76.4 kg)      ASSESSMENT AND PLAN:    CAD   S/P NSTEMI in March with intervention to LAD  No symptoms of angina   Keep on Plavix     2  Hx Atrial fibrillation   Permanent   Continue ELiquis  3  PPM  Hx of CHB   Follows with S [facility]  3  MV dz  Echo last week shows mod MS   Continue to follow   4  HTN  BP is well controlled     5  HL   DL 56  HDL 55    Get labs from Renal clinic         Signed, Dorris Carnes, MD  04/24/2022 8:52 PM    [address], [place], [place]  [address] Phone: [phone]; Fax: [phone]

## 2022-04-24 ENCOUNTER — Encounter: Payer: Self-pay | Admitting: Internal Medicine

## 2022-04-24 ENCOUNTER — Ambulatory Visit (INDEPENDENT_AMBULATORY_CARE_PROVIDER_SITE_OTHER): Payer: Medicare Other | Admitting: Internal Medicine

## 2022-04-24 VITALS — BP 114/66 | HR 75 | Ht 67.0 in | Wt 169.0 lb

## 2022-04-24 DIAGNOSIS — I2510 Atherosclerotic heart disease of native coronary artery without angina pectoris: Secondary | ICD-10-CM | POA: Diagnosis not present

## 2022-04-24 NOTE — Patient Instructions (Signed)
Medication Instructions:   *If you need a refill on your cardiac medications before your next appointment, please call your pharmacy*   Lab Work:  If you have labs (blood work) drawn today and your tests are completely normal, you will receive your results only by: [place] (if you have MyChart) OR A paper copy in the mail If you have any lab test that is abnormal or we need to change your treatment, we will call you to review the results.   Testing/Procedures:    Follow-Up: At [facility], you and your health needs are our priority.  As part of our continuing mission to provide you with exceptional heart care, we have created designated Provider Care Teams.  These Care Teams include your primary Cardiologist (physician) and Advanced Practice Providers (APPs -  Physician Assistants and Nurse Practitioners) who all work together to provide you with the care you need, when you need it.  We recommend signing up for the patient portal called "MyChart".  Sign up information is provided on this After Visit Summary.  MyChart is used to connect with patients for Virtual Visits (Telemedicine).  Patients are able to view lab/test results, encounter notes, upcoming appointments, etc.  Non-urgent messages can be sent to your provider as well.   To learn more about what you can do with MyChart, go to [email or web address].    Your next appointment:   6 month(s)  The format for your next appointment:   In Person  Provider:   Dorris Carnes, MD     Other Instructions   Important Information About Sugar

## 2022-04-25 ENCOUNTER — Other Ambulatory Visit: Payer: Self-pay

## 2022-04-25 DIAGNOSIS — E1122 Type 2 diabetes mellitus with diabetic chronic kidney disease: Secondary | ICD-10-CM

## 2022-04-25 MED ORDER — "BD VEO INSULIN SYRINGE U/F 31G X 15/64"" 0.5 ML MISC": 3 refills | Status: DC

## 2022-05-01 ENCOUNTER — Ambulatory Visit (INDEPENDENT_AMBULATORY_CARE_PROVIDER_SITE_OTHER): Payer: Medicare Other

## 2022-05-01 DIAGNOSIS — I255 Ischemic cardiomyopathy: Secondary | ICD-10-CM | POA: Diagnosis not present

## 2022-05-01 LAB — CUP PACEART REMOTE DEVICE CHECK
Battery Remaining Longevity: 142 mo
Battery Voltage: 3.02 V
Brady Statistic RV Percent Paced: 99.88 %
Date Time Interrogation Session: 20230730223347
Implantable Lead Implant Date: 20211101
Implantable Lead Location: 753860
Implantable Lead Model: 3830
Implantable Pulse Generator Implant Date: 20211101
Lead Channel Impedance Value: 380 Ohm
Lead Channel Impedance Value: 456 Ohm
Lead Channel Pacing Threshold Amplitude: 0.75 V
Lead Channel Pacing Threshold Pulse Width: 0.4 ms
Lead Channel Sensing Intrinsic Amplitude: 6.625 mV
Lead Channel Sensing Intrinsic Amplitude: 6.625 mV
Lead Channel Setting Pacing Amplitude: 2 V
Lead Channel Setting Pacing Pulse Width: 0.4 ms
Lead Channel Setting Sensing Sensitivity: 0.9 mV

## 2022-05-02 ENCOUNTER — Other Ambulatory Visit: Payer: Self-pay | Admitting: Internal Medicine

## 2022-05-10 DIAGNOSIS — H401131 Primary open-angle glaucoma, bilateral, mild stage: Secondary | ICD-10-CM | POA: Diagnosis not present

## 2022-05-18 DIAGNOSIS — I4891 Unspecified atrial fibrillation: Secondary | ICD-10-CM | POA: Diagnosis not present

## 2022-05-18 DIAGNOSIS — N390 Urinary tract infection, site not specified: Secondary | ICD-10-CM | POA: Diagnosis not present

## 2022-05-18 DIAGNOSIS — N184 Chronic kidney disease, stage 4 (severe): Secondary | ICD-10-CM | POA: Diagnosis not present

## 2022-05-18 DIAGNOSIS — I129 Hypertensive chronic kidney disease with stage 1 through stage 4 chronic kidney disease, or unspecified chronic kidney disease: Secondary | ICD-10-CM | POA: Diagnosis not present

## 2022-05-18 DIAGNOSIS — E785 Hyperlipidemia, unspecified: Secondary | ICD-10-CM | POA: Diagnosis not present

## 2022-05-18 DIAGNOSIS — D509 Iron deficiency anemia, unspecified: Secondary | ICD-10-CM | POA: Diagnosis not present

## 2022-05-18 DIAGNOSIS — I5021 Acute systolic (congestive) heart failure: Secondary | ICD-10-CM | POA: Diagnosis not present

## 2022-05-18 DIAGNOSIS — E1122 Type 2 diabetes mellitus with diabetic chronic kidney disease: Secondary | ICD-10-CM | POA: Diagnosis not present

## 2022-06-01 NOTE — Progress Notes (Signed)
Remote pacemaker transmission.   

## 2022-06-16 ENCOUNTER — Encounter: Payer: Self-pay | Admitting: Podiatry

## 2022-06-16 ENCOUNTER — Ambulatory Visit (INDEPENDENT_AMBULATORY_CARE_PROVIDER_SITE_OTHER): Payer: Medicare Other | Admitting: Podiatry

## 2022-06-16 DIAGNOSIS — L84 Corns and callosities: Secondary | ICD-10-CM | POA: Diagnosis not present

## 2022-06-16 DIAGNOSIS — Q828 Other specified congenital malformations of skin: Secondary | ICD-10-CM

## 2022-06-16 DIAGNOSIS — E1151 Type 2 diabetes mellitus with diabetic peripheral angiopathy without gangrene: Secondary | ICD-10-CM | POA: Diagnosis not present

## 2022-06-16 DIAGNOSIS — M79675 Pain in left toe(s): Secondary | ICD-10-CM

## 2022-06-16 DIAGNOSIS — M79674 Pain in right toe(s): Secondary | ICD-10-CM

## 2022-06-16 DIAGNOSIS — B351 Tinea unguium: Secondary | ICD-10-CM | POA: Diagnosis not present

## 2022-06-19 NOTE — Progress Notes (Signed)
  Subjective:  Patient ID: [name], female    DOB: 20-Apr-1936,  MRN: [id number]  [name] presents to clinic today for at risk foot care. Pt has h/o NIDDM with PAD and callus(es) left lower extremity, porokeratotic lesion(s) left heel, and painful mycotic nails. Painful toenails interfere with ambulation. Aggravating factors include wearing enclosed shoe gear. Pain is relieved with periodic professional debridement. Painful callus(es) and porokeratotic lesion(s) are aggravated when weightbearing with and without shoegear. Pain is relieved with periodic professional debridement.  Last known  HgA1c was 5.0%.  Patient did not check blood glucose this morning.  New problem(s): None.   PCP is [name], FNP , and last visit was  April 21, 2022.  Allergies  Allergen Reactions   Aspirin Shortness Of Breath and Other (See Comments)    Caused asthma symptoms   Ramipril Cough    Review of Systems: Negative except as noted in the HPI.  Objective: No changes noted in today's physical examination. [name] is a pleasant 86 y.o. female in NAD. AAO x 3.  Vascular Examination: CFT <3 seconds b/l. DP/PT pulses faintly palpable b/l. Skin temperature gradient warm to warm b/l. No pain with calf compression. No ischemia or gangrene. No cyanosis or clubbing noted b/l. Pedal hair absent.   Neurological Examination: Sensation grossly intact b/l with 10 gram monofilament. Vibratory sensation intact b/l.   Dermatological Examination: Pedal skin warm and supple b/l. Toenails 1-5 b/l thick, discolored, elongated with subungual debris and pain on dorsal palpation.  No open wounds b/l LE. No interdigital macerations noted b/l LE. Hyperkeratotic lesion(s) sub 5th met base left lower extremity.  No erythema, no edema, no drainage, no fluctuance. Porokeratotic lesion(s) left heel. No erythema, no edema, no drainage, no fluctuance.  Musculoskeletal Examination: Muscle strength 5/5 to b/l LE. HAV  with bunion deformity noted b/l LE. Tailor's bunion deformity noted b/l LE. Hammertoe deformity noted 2-5 b/l.  Radiographs: None  Last A1c:      Latest Ref Rng & Units 12/23/2021   10:30 AM 12/12/2021    8:09 AM  Hemoglobin A1C  Hemoglobin-A1c 4.8 - 5.6 % 5.0  5.2    Assessment/Plan: 1. Pain due to onychomycosis of toenails of both feet   2. Porokeratosis   3. Callus   4. Type II diabetes mellitus with peripheral circulatory disorder (HCC)     -Examined patient. -No new findings. No new orders. -Mycotic toenails 1-5 bilaterally were debrided in length and girth with sterile nail nippers and dremel without incident. -Callus(es) sub 5th met base left lower extremity pared utilizing sterile scalpel blade without complication or incident. Total number debrided =1. -Porokeratotic lesion(s) left heel pared and enucleated with sterile currette without incident. Total number of lesions debrided=1. -Patient/POA to call should there be question/concern in the interim.   Return in about 3 months (around 09/15/2022).  Marzetta Board, DPM

## 2022-06-23 ENCOUNTER — Other Ambulatory Visit (INDEPENDENT_AMBULATORY_CARE_PROVIDER_SITE_OTHER): Payer: Medicare Other

## 2022-06-23 ENCOUNTER — Other Ambulatory Visit: Payer: Self-pay | Admitting: Endocrinology

## 2022-06-23 DIAGNOSIS — E1122 Type 2 diabetes mellitus with diabetic chronic kidney disease: Secondary | ICD-10-CM | POA: Diagnosis not present

## 2022-06-23 DIAGNOSIS — N183 Chronic kidney disease, stage 3 unspecified: Secondary | ICD-10-CM

## 2022-06-23 DIAGNOSIS — Z794 Long term (current) use of insulin: Secondary | ICD-10-CM

## 2022-06-23 LAB — BASIC METABOLIC PANEL
BUN: 36 mg/dL — ABNORMAL HIGH (ref 6–23)
CO2: 24 mEq/L (ref 19–32)
Calcium: 9.2 mg/dL (ref 8.4–10.5)
Chloride: 110 mEq/L (ref 96–112)
Creatinine, Ser: 2.15 mg/dL — ABNORMAL HIGH (ref 0.40–1.20)
GFR: 20.7 mL/min — ABNORMAL LOW (ref >60.00)
Glucose, Bld: 178 mg/dL — ABNORMAL HIGH (ref 70–99)
Potassium: 4.3 mEq/L (ref 3.5–5.1)
Sodium: 144 mEq/L (ref 135–145)

## 2022-06-26 LAB — HEMOGLOBIN A1C: Hgb A1c MFr Bld: 5.5 % (ref 4.6–6.5)

## 2022-06-27 ENCOUNTER — Ambulatory Visit (INDEPENDENT_AMBULATORY_CARE_PROVIDER_SITE_OTHER): Payer: Medicare Other | Admitting: Endocrinology

## 2022-06-27 ENCOUNTER — Encounter: Payer: Self-pay | Admitting: Endocrinology

## 2022-06-27 VITALS — BP 138/72 | HR 80 | Ht 67.0 in | Wt 176.6 lb

## 2022-06-27 DIAGNOSIS — N184 Chronic kidney disease, stage 4 (severe): Secondary | ICD-10-CM | POA: Insufficient documentation

## 2022-06-27 DIAGNOSIS — N183 Chronic kidney disease, stage 3 unspecified: Secondary | ICD-10-CM

## 2022-06-27 DIAGNOSIS — I10 Essential (primary) hypertension: Secondary | ICD-10-CM | POA: Diagnosis not present

## 2022-06-27 DIAGNOSIS — Z794 Long term (current) use of insulin: Secondary | ICD-10-CM

## 2022-06-27 DIAGNOSIS — E1122 Type 2 diabetes mellitus with diabetic chronic kidney disease: Secondary | ICD-10-CM | POA: Diagnosis not present

## 2022-06-27 LAB — POCT GLUCOSE (DEVICE FOR HOME USE): Glucose Fasting, POC: 142 mg/dL — AB (ref 70–99)

## 2022-06-27 MED ORDER — DAPAGLIFLOZIN PROPANEDIOL 5 MG PO TABS: 5.0000 mg | ORAL_TABLET | Freq: Every day | ORAL | 3 refills | Status: DC

## 2022-06-27 NOTE — Progress Notes (Signed)
Patient ID: [name], female   DOB: 08/19/1936, 86 y.o.   MRN: [id number]           Reason for Appointment: Type II Diabetes follow-up   History of Present Illness   Diagnosis date: 1996  Previous history:  Non-insulin hypoglycemic drugs previously used: Unknown Insulin was started in 2008 Previously on Lantus insulin that was stopped in 2021  A1c range in the last few years is: 5.2-6.2  Recent history:     Non-insulin hypoglycemic drugs: Currently none      Insulin regimen:    Humalog 3 units before breakfast and 5 before dinner        Side effects from medications: None  Current self management, blood sugar patterns and problems identified:  A1c is still relatively low at 5.5 compared to 5, fructosamine not available She claims that when she was taken off insulin about 2 years ago that her blood sugars went up very high although this was related to acute illness and dehydration She is rarely checking her blood sugars and has difficulty remembering what her blood sugars are Usually not checking readings after meals and mostly before her first meal Does not have any set diet, may have cereal at breakfast sometimes Does not feel hypoglycemic at any time Her lab glucose was 178 fasting on the lab and 142 today fasting  Exercise: minimal  Diet management:     Hypoglycemia:  none    Glucometer: One Touch.           Blood Glucose readings from recall: Usually 150-200 at breakfast or dinner time  Dietician visit: Most recent:    None  Weight control:  Wt Readings from Last 3 Encounters:  06/27/22 176 lb 9.6 oz (80.1 kg)  04/24/22 169 lb (76.7 kg)  04/21/22 169 lb 6.4 oz (76.8 kg)            Diabetes labs:  Lab Results  Component Value Date   HGBA1C 5.5 06/23/2022   HGBA1C 5.0 12/23/2021   HGBA1C 5.2 12/12/2021   Lab Results  Component Value Date   MICROALBUR 17.0 (H) 04/12/2016   LDLCALC 56 12/23/2021   CREATININE 2.15 (H) 06/23/2022    Lab Results   Component Value Date   FRUCTOSAMINE 262 07/29/2020   FRUCTOSAMINE 292 (H) 04/12/2016   FRUCTOSAMINE 395 (H) 07/02/2009     Allergies as of 06/27/2022       Reactions   Aspirin Shortness Of Breath, Other (See Comments)   Caused asthma symptoms   Ramipril Cough        Medication List        Accurate as of June 27, 2022  8:46 PM. If you have any questions, ask your nurse or doctor.          acetaminophen 325 MG tablet Commonly known as: TYLENOL Take 650 mg by mouth every 6 (six) hours as needed for pain.   albuterol (2.5 MG/3ML) 0.083% nebulizer solution Commonly known as: PROVENTIL Take 3 mLs (2.5 mg total) by nebulization every 6 (six) hours as needed for wheezing or shortness of breath.   albuterol 108 (90 Base) MCG/ACT inhaler Commonly known as: ProAir HFA 2 puffs every 4 hours as needed only  if your can't catch your breath   atorvastatin 80 MG tablet Commonly known as: LIPITOR Take 1 tablet (80 mg total) by mouth daily.   BD Veo Insulin Syringe U/F 31G X 15/64" 0.5 ML Misc Generic drug: Insulin Syringe-Needle U-100 Use  as instructed   budesonide-formoterol 160-4.5 MCG/ACT inhaler Commonly known as: Symbicort TAKE 2 PUFFS FIRST THING IN AM AND THEN ANOTHER 2 PUFFS ABOUT 12 HOURS LATER.   carvedilol 3.125 MG tablet Commonly known as: COREG Take 1 tablet (3.125 mg total) by mouth 2 (two) times daily with a meal.   cetirizine 10 MG tablet Commonly known as: ZyrTEC Allergy Take 1 tablet (10 mg total) by mouth daily as needed for allergies.   clopidogrel 75 MG tablet Commonly known as: PLAVIX Take 1 tablet (75 mg total) by mouth daily with breakfast.   dapagliflozin propanediol 5 MG Tabs tablet Commonly known as: Farxiga Take 1 tablet (5 mg total) by mouth daily before breakfast. Started by: Elayne Snare, MD   diclofenac Sodium 1 % Gel Commonly known as: VOLTAREN Apply 4 g topically 4 (four) times daily as needed (pain).   Eliquis 2.5 MG Tabs  tablet Generic drug: apixaban TAKE 1 TABLET BY MOUTH TWICE A DAY   famotidine 20 MG tablet Commonly known as: Pepcid Take 1 tablet (20 mg total) by mouth 2 (two) times daily.   furosemide 40 MG tablet Commonly known as: LASIX TAKE 1 TABLET BY MOUTH TWICE A DAY   glucose blood test strip Check blood sugar twice a day   OneTouch Verio test strip Generic drug: glucose blood 1 each by Other route 2 (two) times daily. And lancets 2/day   hydrALAZINE 25 MG tablet Commonly known as: APRESOLINE TAKE 3 TABLETS (75 MG TOTAL) BY MOUTH 3 (THREE) TIMES DAILY.   insulin lispro 100 UNIT/ML injection Commonly known as: HumaLOG Give 3 units with BREAKFAST, AND 5 units with SUPPER   latanoprost 0.005 % ophthalmic solution Commonly known as: XALATAN Place 1 drop into both eyes at bedtime.   montelukast 10 MG tablet Commonly known as: SINGULAIR TAKE 1 TABLET BY MOUTH EVERYDAY AT BEDTIME   nitroGLYCERIN 0.4 MG SL tablet Commonly known as: NITROSTAT Place 1 tablet (0.4 mg total) under the tongue every 5 (five) minutes x 3 doses as needed for chest pain.   OneTouch Verio Flex System w/Device Kit USE AS DIRECTED   pantoprazole 40 MG tablet Commonly known as: PROTONIX Take 1 tablet (40 mg total) by mouth 2 (two) times daily.   potassium chloride 10 MEQ tablet Commonly known as: KLOR-CON TAKE 1 TABLET BY MOUTH EVERY DAY   pravastatin 40 MG tablet Commonly known as: PRAVACHOL Take 40 mg by mouth daily.   sodium bicarbonate 650 MG tablet Take 650 mg by mouth 2 (two) times daily.   SYRINGE 3CC/25GX1" 25G X 1" 3 ML Misc Use to inject into the skin 2x a day   traMADol 50 MG tablet Commonly known as: ULTRAM Take 50 mg by mouth every 6 (six) hours as needed for moderate pain.   vitamin C 1000 MG tablet Take 1,000 mg by mouth daily.        Allergies:  Allergies  Allergen Reactions   Aspirin Shortness Of Breath and Other (See Comments)    Caused asthma symptoms   Ramipril  Cough    Past Medical History:  Diagnosis Date   Allergic rhinitis    Anterior chest wall pain    Anxiety    Asthma    Chronic diastolic CHF (congestive heart failure) (HCC)    Echo 01/2020: EF 60-65, normal wall motion, mild LVH, normal RV SF, RVSP 52.6 (moderate elevation), severe LAE, moderate RAE, trivial MR, mild MS (mean gradient 5.5 mmHg), mild aortic valve sclerosis (no  AS); elevated E/e' c/w elevated LVEDP   Cough    DM type 2 (diabetes mellitus, type 2) (HCC)    GERD (gastroesophageal reflux disease)    History of diverticulitis of colon    HTN (hypertension)    Hyperlipidemia    IBS (irritable bowel syndrome)    Iron deficiency anemia    Mitral stenosis 04/17/2022   Echo 04/2022: EF 55-60, no RWMA, mild LVH, normal RVSF, normal PASP, moderate LAE, moderate mitral stenosis (mean gradient 4 mmHg), trivial MR, trivial AI, AV sclerosis without stenosis   Osteoporosis, unspecified    Renal insufficiency 07/31/2017   UTI (urinary tract infection)     Past Surgical History:  Procedure Laterality Date   CARDIOVASCULAR STRESS TEST  02/25/04   CORONARY BALLOON ANGIOPLASTY N/A 12/28/2021   Procedure: CORONARY BALLOON ANGIOPLASTY;  Surgeon: Burnell Blanks, MD;  Location: [facility];  Service: Cardiovascular;  Laterality: N/A;   CORONARY STENT INTERVENTION N/A 12/28/2021   Procedure: CORONARY STENT INTERVENTION;  Surgeon: Burnell Blanks, MD;  Location: [facility];  Service: Cardiovascular;  Laterality: N/A;   ESOPHAGOGASTRODUODENOSCOPY  12/26/01   PACEMAKER IMPLANT N/A 08/02/2020   Procedure: PACEMAKER IMPLANT;  Surgeon: Deboraha Sprang, MD;  Location: [facility];  Service: Cardiovascular;  Laterality: N/A;   RIGHT OOPHORECTOMY  jan 2010   RIGHT/LEFT HEART CATH AND CORONARY ANGIOGRAPHY N/A 12/28/2021   Procedure: RIGHT/LEFT HEART CATH AND CORONARY ANGIOGRAPHY;  Surgeon: Burnell Blanks, MD;  Location: [facility];  Service:  Cardiovascular;  Laterality: N/A;    Family History  Problem Relation Age of Onset   Lung cancer Brother    Melanoma Brother    Hypertension Mother    Stroke Mother    Other Father        poor circulation   Cancer Sister    Diabetes Daughter    Atopy Neg Hx    Asthma Neg Hx    Breast cancer Neg Hx     Social History:  reports that she quit smoking about 35 years ago. Her smoking use included cigarettes. She has a 1.50 pack-year smoking history. She has never used smokeless tobacco. She reports that she does not drink alcohol and does not use drugs.  Review of Systems:  Last diabetic eye exam date 8/23  Last urine microalbumin date: With nephrologist  Last foot exam date: 9/23 with podiatrist  Symptoms of neuropathy:none  Hypertension: Managed by nephrologist Currently taking hydralazine 25 mg 3 times daily and 3.125 mg Coreg he is also on 40 mg Lasix  ACE/ARB medication: None  BP Readings from Last 3 Encounters:  06/27/22 138/72  04/24/22 114/66  04/21/22 132/70   Lab Results  Component Value Date   CREATININE 2.15 (H) 06/23/2022   CREATININE 2.28 (H) 01/23/2022   CREATININE 4.28 (H) 01/04/2022     Lipid management: Being treated with pravastatin 40 mg daily    Lab Results  Component Value Date   CHOL 120 12/23/2021   CHOL 135 07/28/2021   CHOL 104 08/27/2019   Lab Results  Component Value Date   HDL 55 12/23/2021   HDL 64 07/28/2021   HDL 44.90 08/27/2019   Lab Results  Component Value Date   LDLCALC 56 12/23/2021   LDLCALC 58 07/28/2021   LDLCALC 47 08/27/2019   Lab Results  Component Value Date   TRIG 46 12/23/2021   TRIG 62 07/28/2021   TRIG 59.0 08/27/2019   Lab Results  Component Value Date   CHOLHDL 2.2 12/23/2021   CHOLHDL 2.1 07/28/2021   CHOLHDL 2 08/27/2019   No results found for: "LDLDIRECT"   Fibrosis 4 Score = 2.5       Fib-4 interpretation is not validated for people under 22 or over 11 years of age.       Examination:   BP 138/72 (BP Location: Left Arm)   Pulse 80   Ht '5\' 7"'  (1.702 m)   Wt 176 lb 9.6 oz (80.1 kg)   SpO2 100%   BMI 27.66 kg/m   Body mass index is 27.66 kg/m.    ASSESSMENT/ PLAN:    Diabetes type 2:   Current regimen: Low-dose Humalog twice daily with meals  See history of present illness for detailed discussion of current diabetes management, blood sugar patterns and problems identified  A1c is likely falsely low as her blood sugars appear to be generally between 140-200 at least when checked before meals She is only on Humalog insulin Difficult to identify blood sugar patterns because of inadequate glucose monitoring and not bringing her meter for download She is also not on any non-insulin hypoglycemic drugs that would benefit her with better insulin effect as she is unlikely to be completely insulin deficient Also she needs assessment of her meal planning and effects of various foods on her blood sugars  CKD: Etiology unclear but likely related to hypertension and diabetes, recent creatinine is 2.1 and stable  Recommendations:  Trial of FARXIGA both for diabetes and chronic kidney disease Discussed that she has chronic kidney disease related to diabetes and hypertension and currently on no disease modifying drug She will also need to monitor blood sugars closely so we can adjust her insulin or potentially stop Discussed action of SGLT 2 drugs on lowering glucose by decreasing kidney absorption of glucose, benefits of weight loss and lower blood pressure, possible side effects including candidiasis and dosage regimen  Given information on the Farxiga and chronic kidney disease She will start with 5 mg daily and follow-up in 3 months In the meantime she needs to check her blood pressure regularly and call if below 120 Also discussed that if she needs the patient assistance program we can help her apply She does need to exercise consistently with walking as much  as tolerated 2 or 3 times a day Consultation with diabetes educator for comprehensive education with including meal planning and review of her progress in about a month Plan of management discussed with her and her daughter who was present  Patient Instructions  Check blood sugars on waking up 2-3 days a week  Also check blood sugars about 2 hours after meals and do this after different meals by rotation  Recommended blood sugar levels on waking up are 90-130 and about 2 hours after meal is 130-180  Please bring your blood sugar monitor to each visit, thank you  Check BP weekly, call if < 120  Total visit time for evaluation and management of multiple problems and counseling = 35 minutes  Fallon Haecker 06/27/2022, 8:46 PM

## 2022-06-27 NOTE — Patient Instructions (Addendum)
Check blood sugars on waking up 2-3 days a week  Also check blood sugars about 2 hours after meals and do this after different meals by rotation  Recommended blood sugar levels on waking up are 90-130 and about 2 hours after meal is 130-180  Please bring your blood sugar monitor to each visit, thank you  Check BP weekly, call if < 120

## 2022-07-04 ENCOUNTER — Other Ambulatory Visit: Payer: Self-pay | Admitting: Family

## 2022-07-04 DIAGNOSIS — M1711 Unilateral primary osteoarthritis, right knee: Secondary | ICD-10-CM | POA: Diagnosis not present

## 2022-07-06 ENCOUNTER — Other Ambulatory Visit: Payer: Self-pay

## 2022-07-06 MED ORDER — POTASSIUM CHLORIDE ER 10 MEQ PO TBCR: 10.0000 meq | EXTENDED_RELEASE_TABLET | Freq: Every day | ORAL | 1 refills | Status: DC

## 2022-07-11 ENCOUNTER — Other Ambulatory Visit: Payer: Self-pay | Admitting: Family

## 2022-07-11 ENCOUNTER — Other Ambulatory Visit: Payer: Self-pay | Admitting: Internal Medicine

## 2022-07-11 DIAGNOSIS — I4891 Unspecified atrial fibrillation: Secondary | ICD-10-CM

## 2022-07-11 NOTE — Telephone Encounter (Signed)
Prescription refill request for Eliquis received. Indication: AF Last office visit: 04/24/22  Lizbeth Bark MD Scr: 2.15 on 06/23/22 Age: 86 Weight: 76.7kg  Based on above findings Eliquis 2.5mg  twice daily is the appropriate dose.  Refill approved.

## 2022-07-11 NOTE — Telephone Encounter (Signed)
I have called the pharmacy and they stated that they did not get it the rx that was sent on 07/04/22; so I resent it today.

## 2022-07-13 ENCOUNTER — Ambulatory Visit (INDEPENDENT_AMBULATORY_CARE_PROVIDER_SITE_OTHER): Payer: Medicare Other | Admitting: Family

## 2022-07-13 ENCOUNTER — Encounter: Payer: Self-pay | Admitting: Family

## 2022-07-13 VITALS — BP 132/78 | HR 75 | Temp 98.1°F | Resp 18 | Ht 67.0 in | Wt 173.4 lb

## 2022-07-13 DIAGNOSIS — R1032 Left lower quadrant pain: Secondary | ICD-10-CM

## 2022-07-13 DIAGNOSIS — J45991 Cough variant asthma: Secondary | ICD-10-CM

## 2022-07-13 DIAGNOSIS — J309 Allergic rhinitis, unspecified: Secondary | ICD-10-CM

## 2022-07-13 MED ORDER — ONDANSETRON HCL 8 MG PO TABS: 8.0000 mg | ORAL_TABLET | Freq: Three times a day (TID) | ORAL | 0 refills | Status: DC | PRN

## 2022-07-13 MED ORDER — AMOXICILLIN-POT CLAVULANATE 500-125 MG PO TABS: 1.0000 | ORAL_TABLET | Freq: Two times a day (BID) | ORAL | 0 refills | Status: AC

## 2022-07-13 NOTE — Progress Notes (Signed)
[name] is a 86 y.o. female with the following history as recorded in EpicCare:  Patient Active Problem List   Diagnosis Date Noted   CKD (chronic kidney disease) stage 4, GFR 15-29 ml/min ([place]) 06/27/2022   Mitral stenosis 04/17/2022   Acute kidney injury superimposed on chronic kidney disease ([place]) 01/02/2022   NSTEMI (non-ST elevated myocardial infarction) ([place]) 12/23/2021   Osteoarthritis of knee 03/30/2021   Bilateral hand pain 03/08/2021   Complete heart block ([place]) 12/13/2020   Pacemaker - MDT 12/13/2020   SOB (shortness of breath) 07/29/2020   Encntr for surgical aftcr following surgery on the circ sys 07/26/2020   Pain in right knee 03/17/2020   Chronic combined systolic (congestive) and diastolic (congestive) heart failure ([place])    DKA (diabetic ketoacidoses) 12/20/2019   Diverticulosis 12/10/2019   Age-related osteoporosis without current pathological fracture 10/03/2019   Chronic kidney disease, stage 3 unspecified ([place]) 10/03/2019   Long term (current) use of insulin ([place]) 10/03/2019   Hyperlipidemia, unspecified 10/03/2019   Shortness of breath 01/30/2018   Acute bronchiolitis 12/17/2017   Wheezing 10/29/2017   Vitamin D deficiency 07/31/2017   Renal insufficiency 07/31/2017   Knee pain 06/01/2017   Morbid obesity due to excess calories ([place]) 01/25/2017   Diabetes ([place]) 04/12/2016   Cough 01/20/2015   Atrial fibrillation ([place]) 05/28/2014   Personal history of colonic polyps 05/28/2014   Vomiting 01/29/2014   CAD (coronary artery disease) 09/01/2013   Routine general medical examination at a health care facility 07/17/2011   Encounter for long-term (current) use of other medications 07/06/2011   Nonspecific (abnormal) findings on radiological and other examination of body structure 11/09/2009   IRRITABLE BOWEL SYNDROME 05/07/2009   CHEST PAIN 05/07/2009   ADNEXAL MASS, RIGHT 07/22/2008   HEMORRHOIDS, RECURRENT 01/27/2008   Diverticulitis 01/27/2008   OTH  ABNORMAL FIND RAD EXAMINATION BREAST 01/27/2008   GERD 01/13/2008   UTI 09/28/2007   Dyslipidemia 05/27/2007   ANEMIA-IRON DEFICIENCY 05/27/2007   ANXIETY 05/27/2007   Essential hypertension 05/27/2007   Seasonal allergic rhinitis 05/27/2007   Cough variant asthma 05/27/2007   Osteoporosis 05/27/2007   DIVERTICULITIS, HX OF 05/27/2007    Current Outpatient Medications  Medication Sig Dispense Refill   acetaminophen (TYLENOL) 325 MG tablet Take 650 mg by mouth every 6 (six) hours as needed for pain.     albuterol (PROAIR HFA) 108 (90 Base) MCG/ACT inhaler 2 puffs every 4 hours as needed only  if your can't catch your breath 18 g 11   albuterol (PROVENTIL) (2.5 MG/3ML) 0.083% nebulizer solution Take 3 mLs (2.5 mg total) by nebulization every 6 (six) hours as needed for wheezing or shortness of breath. 150 mL 1   amoxicillin-clavulanate (AUGMENTIN) 500-125 MG tablet Take 1 tablet by mouth in the morning and at bedtime for 7 days. 14 tablet 0   apixaban (ELIQUIS) 2.5 MG TABS tablet TAKE 1 TABLET BY MOUTH TWICE A DAY 60 tablet 5   Ascorbic Acid (VITAMIN C) 1000 MG tablet Take 1,000 mg by mouth daily.     atorvastatin (LIPITOR) 80 MG tablet Take 1 tablet (80 mg total) by mouth daily. 90 tablet 3   BD VEO INSULIN SYRINGE U/F 31G X 15/64" 0.5 ML MISC Use as instructed 100 each 3   Blood Glucose Monitoring Suppl (ONETOUCH VERIO FLEX SYSTEM) w/Device KIT USE AS DIRECTED 1 kit .   budesonide-formoterol (SYMBICORT) 160-4.5 MCG/ACT inhaler TAKE 2 PUFFS FIRST THING IN AM AND THEN ANOTHER 2 PUFFS ABOUT 12  HOURS LATER. 30.6 each 5   carvedilol (COREG) 3.125 MG tablet Take 1 tablet (3.125 mg total) by mouth 2 (two) times daily with a meal. 180 tablet 3   cetirizine (ZYRTEC ALLERGY) 10 MG tablet Take 1 tablet (10 mg total) by mouth daily as needed for allergies.     clopidogrel (PLAVIX) 75 MG tablet Take 1 tablet (75 mg total) by mouth daily with breakfast. 90 tablet 3   dapagliflozin propanediol (FARXIGA)  5 MG TABS tablet Take 1 tablet (5 mg total) by mouth daily before breakfast. 30 tablet 3   diclofenac Sodium (VOLTAREN) 1 % GEL Apply 4 g topically 4 (four) times daily as needed (pain).     famotidine (PEPCID) 20 MG tablet TAKE 1 TABLET BY MOUTH TWICE A DAY 180 tablet 0   furosemide (LASIX) 40 MG tablet TAKE 1 TABLET BY MOUTH TWICE A DAY 180 tablet 1   glucose blood (ONETOUCH VERIO) test strip 1 each by Other route 2 (two) times daily. And lancets 2/day 200 each 3   glucose blood test strip Check blood sugar twice a day 100 each 12   hydrALAZINE (APRESOLINE) 25 MG tablet TAKE 3 TABLETS (75 MG TOTAL) BY MOUTH 3 (THREE) TIMES DAILY. 810 tablet 2   insulin lispro (HUMALOG) 100 UNIT/ML injection Give 3 units with BREAKFAST, AND 5 units with SUPPER 60 mL 1   latanoprost (XALATAN) 0.005 % ophthalmic solution Place 1 drop into both eyes at bedtime.     montelukast (SINGULAIR) 10 MG tablet TAKE 1 TABLET BY MOUTH EVERYDAY AT BEDTIME 90 tablet 3   nitroGLYCERIN (NITROSTAT) 0.4 MG SL tablet Place 1 tablet (0.4 mg total) under the tongue every 5 (five) minutes x 3 doses as needed for chest pain. 25 tablet 2   ondansetron (ZOFRAN) 8 MG tablet Take 1 tablet (8 mg total) by mouth every 8 (eight) hours as needed for nausea or vomiting. 20 tablet 0   pantoprazole (PROTONIX) 40 MG tablet Take 1 tablet (40 mg total) by mouth 2 (two) times daily. 180 tablet 3   potassium chloride (KLOR-CON) 10 MEQ tablet Take 1 tablet (10 mEq total) by mouth daily. 90 tablet 1   sodium bicarbonate 650 MG tablet Take 650 mg by mouth 2 (two) times daily.     Syringe/Needle, Disp, (SYRINGE 3CC/25GX1") 25G X 1" 3 ML MISC Use to inject into the skin 2x a day 100 each 3   traMADol (ULTRAM) 50 MG tablet Take 50 mg by mouth every 6 (six) hours as needed for moderate pain.     Current Facility-Administered Medications  Medication Dose Route Frequency Provider Last Rate Last Admin   ipratropium-albuterol (DUONEB) 0.5-2.5 (3) MG/3ML nebulizer  solution 3 mL  3 mL Nebulization Once [name], FNP        Allergies: Aspirin and Ramipril  Past Medical History:  Diagnosis Date   Allergic rhinitis    Anterior chest wall pain    Anxiety    Asthma    Chronic diastolic CHF (congestive heart failure) ([place])    Echo 01/2020: EF 60-65, normal wall motion, mild LVH, normal RV SF, RVSP 52.6 (moderate elevation), severe LAE, moderate RAE, trivial MR, mild MS (mean gradient 5.5 mmHg), mild aortic valve sclerosis (no AS); elevated E/e' c/w elevated LVEDP   Cough    DM type 2 (diabetes mellitus, type 2) (HCC)    GERD (gastroesophageal reflux disease)    History of diverticulitis of colon    HTN (hypertension)  Hyperlipidemia    IBS (irritable bowel syndrome)    Iron deficiency anemia    Mitral stenosis 04/17/2022   Echo 04/2022: EF 55-60, no RWMA, mild LVH, normal RVSF, normal PASP, moderate LAE, moderate mitral stenosis (mean gradient 4 mmHg), trivial MR, trivial AI, AV sclerosis without stenosis   Osteoporosis, unspecified    Renal insufficiency 07/31/2017   UTI (urinary tract infection)     Past Surgical History:  Procedure Laterality Date   CARDIOVASCULAR STRESS TEST  02/25/04   CORONARY BALLOON ANGIOPLASTY N/A 12/28/2021   Procedure: CORONARY BALLOON ANGIOPLASTY;  Surgeon: Burnell Blanks, MD;  Location: [facility];  Service: Cardiovascular;  Laterality: N/A;   CORONARY STENT INTERVENTION N/A 12/28/2021   Procedure: CORONARY STENT INTERVENTION;  Surgeon: Burnell Blanks, MD;  Location: [facility];  Service: Cardiovascular;  Laterality: N/A;   ESOPHAGOGASTRODUODENOSCOPY  12/26/01   PACEMAKER IMPLANT N/A 08/02/2020   Procedure: PACEMAKER IMPLANT;  Surgeon: Deboraha Sprang, MD;  Location: [facility];  Service: Cardiovascular;  Laterality: N/A;   RIGHT OOPHORECTOMY  jan 2010   RIGHT/LEFT HEART CATH AND CORONARY ANGIOGRAPHY N/A 12/28/2021   Procedure: RIGHT/LEFT HEART CATH AND CORONARY  ANGIOGRAPHY;  Surgeon: Burnell Blanks, MD;  Location: [facility];  Service: Cardiovascular;  Laterality: N/A;    Family History  Problem Relation Age of Onset   Lung cancer Brother    Melanoma Brother    Hypertension Mother    Stroke Mother    Other Father        poor circulation   Cancer Sister    Diabetes Daughter    Atopy Neg Hx    Asthma Neg Hx    Breast cancer Neg Hx     Social History   Tobacco Use   Smoking status: Former    Packs/day: 0.30    Years: 5.00    Total pack years: 1.50    Types: Cigarettes    Quit date: 10/02/1986    Years since quitting: 35.8   Smokeless tobacco: Never  Substance Use Topics   Alcohol use: No    Subjective:  Started last Friday with abdominal pain/ nausea- seemed to start after eating a hot dog; pain is localized over LLQ; does feel that symptoms are improving some but continuing with nausea; has been told she has had diverticulitis in the past;     Objective:  Vitals:   07/13/22 1123  BP: 132/78  Pulse: 75  Resp: 18  Temp: 98.1 F (36.7 C)  TempSrc: Oral  SpO2: 100%  Weight: 173 lb 6.4 oz (78.7 kg)  Height: '5\' 7"'  (1.702 m)    General: Well developed, well nourished, in no acute distress  Skin : Warm and dry.  Head: Normocephalic and atraumatic  Eyes: Sclera and conjunctiva clear; pupils round and reactive to light; extraocular movements intact  Ears: External normal; canals clear; tympanic membranes normal  Oropharynx: Pink, supple. No suspicious lesions  Neck: Supple without thyromegaly, adenopathy  Lungs: Respirations unlabored; clear to auscultation bilaterally without wheeze, rales, rhonchi CVS exam: normal rate and regular rhythm.  Abdomen: Soft; nontender; nondistended; normoactive bowel sounds; no masses or hepatosplenomegaly  Neurologic: Alert and oriented; speech intact; face symmetrical; moves all extremities well; CNII-XII intact without focal deficit   Assessment:  1. Left lower quadrant  abdominal pain   2. Cough variant asthma   3. Allergic rhinitis, unspecified seasonality, unspecified trigger     Plan:  ? Diverticulitis; will update  abd/ pelvic CT without contrast; will go ahead and start Augmentin ( lower dosage used due to kidney disease); follow up to be determined;   Continue to use Symbicort bid as directed; can use albuterol q 4-6 hours as needed for the next days; discussed that ragweed counts are high right now which can contribute to allergy issues.    No follow-ups on file.  Orders Placed This Encounter  Procedures   CT Abdomen Pelvis Wo Contrast    Would like to do tomorrow    Standing Status:   Future    Standing Expiration Date:   07/14/2023    Order Specific Question:   Preferred imaging location?    Answer:   [id number]    Order Specific Question:   Is Oral Contrast requested for this exam?    Answer:   No oral contrast    Order Specific Question:   Reason for No Oral Contrast    Answer:   Other    Order Specific Question:   Please answer why no oral contrast is requested    Answer:   CKD    Requested Prescriptions   Signed Prescriptions Disp Refills   amoxicillin-clavulanate (AUGMENTIN) 500-125 MG tablet 14 tablet 0    Sig: Take 1 tablet by mouth in the morning and at bedtime for 7 days.   ondansetron (ZOFRAN) 8 MG tablet 20 tablet 0    Sig: Take 1 tablet (8 mg total) by mouth every 8 (eight) hours as needed for nausea or vomiting.

## 2022-07-17 ENCOUNTER — Other Ambulatory Visit: Payer: Self-pay | Admitting: Internal Medicine

## 2022-07-19 ENCOUNTER — Ambulatory Visit
Admission: RE | Admit: 2022-07-19 | Discharge: 2022-07-19 | Disposition: A | Discharge status: Home / Self Care | Payer: Medicare Other | Source: Ambulatory Visit | Attending: Family | Admitting: Family

## 2022-07-19 DIAGNOSIS — K8020 Calculus of gallbladder without cholecystitis without obstruction: Secondary | ICD-10-CM | POA: Diagnosis not present

## 2022-07-19 DIAGNOSIS — R1032 Left lower quadrant pain: Secondary | ICD-10-CM

## 2022-07-19 DIAGNOSIS — K449 Diaphragmatic hernia without obstruction or gangrene: Secondary | ICD-10-CM | POA: Diagnosis not present

## 2022-07-27 ENCOUNTER — Ambulatory Visit: Payer: Medicare Other | Admitting: Internal Medicine

## 2022-07-30 NOTE — Progress Notes (Signed)
Subjective:  Patient ID: [name], female   DOB: 08/05/1936 .   MRN: [id number]  Brief patient profile:  46 yobf quit smoking 1988 with h/o asthma  And nl baseline pfts ( as of 01/04/2010) who had been using Advair on a p.r.n. basis and noticing increasing dyspnea and need for rescue therapy x one mo when seen 01/06/08 for pulmonary evaluation so Advair stopped. Began Symbicort 160/4.65mg 2 puffs two times a day.  Returned 02/13/08 improved with decreased dyspnea and no rescue use   Returned 03/18/08 symptom free no rescue needed, stopped reglan, no flare   05/04/2020  f/u ov/Anayla Giannetti re: asthma/ rhinitis not sure about ppi/ using benadryl helps some  Chief Complaint  Patient presents with   Follow-up    Increased SOB and chest tightness over the past month. She has some cough- prod with min light yellow sputum. She is using her albuterol inhaler about 2 x per day.   Dyspnea:  MMRC2 = can't walk a nl pace on a flat grade s sob but does fine slow and flat  Cough: more cough assoc with pnds day > noct/ not taking omeprazole  Sleeping: 2 pillows ok  SABA use: 2x daily  02: none  rec Plan A = Automatic = Always=    Symbicort 160 Take 2 puffs first thing in am and then another 2 puffs about 12 hours later.  Work on inhaler technique:    Omerprazole 40 mg Take 30-60 min before first meal of the day  Prednisone 10 mg take  4 each am x 2 days,   2 each am x 2 days,  1 each am x 2 days and stop  Plan B = Backup (to supplement plan A, not to replace it) Only use your albuterol inhaler as a rescue medication  Please schedule vaccine > pfizer 2nd shot on 06/04/20       07/04/2021  f/u ov/[name] re: cough variant asthma   maint on symbicort 160 2bid/ singulair  - no longer on gerd rx  Chief Complaint  Patient presents with   Follow-up    Still coughing some   Dyspnea:  baseline still MMRC2 = can't walk a nl pace on a flat grade s sob but does fine slow and flat eg Mall walking  Cough: worse x one week, slt  yellow not on gerd rx  Sleeping: flat bed/ 2 pillows- no noct symptoms SABA use: started up using it one week prior to OV  twice daily  02: none  Covid status:   2 vax / never got covid  Rec At onset cough the 1st thing you should do :  Try prilosec otc 295m Take 30-60 min before first meal of the day and Pepcid ac (famotidine) 20 mg one @  bedtime until cough is completely gone   Prednisone 10 mg take  4 each am x 2 days,   2 each am x 2 days,  1 each am x 2 days and stop  Zpak   12/28/21 LHC/RHC   Mid RCA lesion is 30% stenosed.   Dist RCA lesion is 50% stenosed.   1st Diag lesion is 90% stenosed.   Prox LAD to Mid LAD lesion is 95% stenosed.   A drug-eluting stent was successfully placed using a SYNERGY XD 3.0X16.   Balloon angioplasty was performed using a BALLN SAPPHIRE 2.0X12.   Post intervention, there is a 0% residual stenosis.   Post intervention, there is a 30% residual  stenosis.   Severe proximal LAD stenosis involving a moderate caliber diagonal branch.  Successful PTCA/DES x 1 proximal LAD Successful balloon angioplasty Diagonal 1 No obstructive disease in the Circumflex artery Moderate non-obstructive disease in the dominant RCA Elevated right and left heart pressures. (Wedge 27)     01/25/2022  f/u ov/[name] re: cough variant asthma  maint on symbicort [place] Complaint  Patient presents with   Follow-up    Patient is having shortness of breath and weakness over the last month. She was admitted into the hospital in March for heart attack. Productive/dry cough with yellow sputum. Patient currently on prednisone.   Dyspnea:  very sedentary since heart cath above  Cough: sporadic / rattles/ slt yellow mucus Sleeping: flat 2 pillows bed once a week needs at third pillow but no albuterol SABA use: until 01/24/22 did not have any / then neb added and took am 2 h prior to OV   02: none  Covid status:  vax x 2, never covid infection  Rec Plan A = Automatic =  Always=    Symbicort 160 (Breztri) Take 2 puffs first thing in am and then another 2 puffs about 12 hours later.  Work on inhaler technique:   Plan B = Backup (to supplement plan A, not to replace it) Only use your albuterol inhaler as a rescue medication Plan C = Crisis (instead of Plan B but only if Plan B stops working) - only use your albuterol nebulizer if you first try Plan B  At onset cough (if you are not already on Protonix which should be twice daily 30 min before meals) the 1st thing you should do :  Try prilosec otc 24m  Take 30-60 min before first meal of the day and Pepcid ac (famotidine) 20 mg one @  bedtime until cough is completely gone   Zpak  Please remember to go to the  x-ray department : cxr ok    07/31/2022  f/u ov/[name] re: cough variant asthma  maint on symbicort   No chief complaint on file.  Dyspnea:  can do belks pushing the cart with HC parking  Cough: sporadic Sleeping: flat bed / 2 pillows  SABA use: once or twice a week 02: none  Covid status:   just first 2     No obvious day to day or daytime variability or assoc excess/ purulent sputum or mucus plugs or hemoptysis or cp or chest tightness, subjective wheeze or overt sinus or hb symptoms.   Sleeping  without nocturnal  or early am exacerbation  of respiratory  c/o's or need for noct saba. Also denies any obvious fluctuation of symptoms with weather or environmental changes or other aggravating or alleviating factors except as outlined above   No unusual exposure hx or h/o childhood pna/ asthma or knowledge of premature birth.  Current Allergies, Complete Past Medical History, Past Surgical History, Family History, and Social History were reviewed in [employer].  ROS  The following are not active complaints unless bolded Hoarseness/baseline x years, sore throat, dysphagia, dental problems, itching, sneezing,  nasal congestion or discharge of excess mucus or purulent  secretions, ear ache,   fever, chills, sweats, unintended wt loss or wt gain, classically pleuritic or exertional cp,  orthopnea pnd or arm/hand swelling  or leg swelling, presyncope, palpitations, abdominal pain, anorexia, nausea, vomiting, diarrhea  or change in bowel habits or change in bladder habits, change in stools or change  in urine, dysuria, hematuria,  rash, arthralgias, visual complaints, headache, numbness, weakness or ataxia or problems with walking or coordination,  change in mood or  memory.        Current Meds  Medication Sig   acetaminophen (TYLENOL) 325 MG tablet Take 650 mg by mouth every 6 (six) hours as needed for pain.   albuterol (PROAIR HFA) 108 (90 Base) MCG/ACT inhaler 2 puffs every 4 hours as needed only  if your can't catch your breath   albuterol (PROVENTIL) (2.5 MG/3ML) 0.083% nebulizer solution Take 3 mLs (2.5 mg total) by nebulization every 6 (six) hours as needed for wheezing or shortness of breath.   apixaban (ELIQUIS) 2.5 MG TABS tablet TAKE 1 TABLET BY MOUTH TWICE A DAY   Ascorbic Acid (VITAMIN C) 1000 MG tablet Take 1,000 mg by mouth daily.   atorvastatin (LIPITOR) 80 MG tablet Take 1 tablet (80 mg total) by mouth daily.   BD VEO INSULIN SYRINGE U/F 31G X 15/64" 0.5 ML MISC Use as instructed   Blood Glucose Monitoring Suppl (ONETOUCH VERIO FLEX SYSTEM) w/Device KIT USE AS DIRECTED   budesonide-formoterol (SYMBICORT) 160-4.5 MCG/ACT inhaler TAKE 2 PUFFS FIRST THING IN AM AND THEN ANOTHER 2 PUFFS ABOUT 12 HOURS LATER.   carvedilol (COREG) 3.125 MG tablet Take 1 tablet (3.125 mg total) by mouth 2 (two) times daily with a meal.   cetirizine (ZYRTEC ALLERGY) 10 MG tablet Take 1 tablet (10 mg total) by mouth daily as needed for allergies.   clopidogrel (PLAVIX) 75 MG tablet Take 1 tablet (75 mg total) by mouth daily with breakfast.   dapagliflozin propanediol (FARXIGA) 5 MG TABS tablet Take 1 tablet (5 mg total) by mouth daily before breakfast.   diclofenac Sodium  (VOLTAREN) 1 % GEL Apply 4 g topically 4 (four) times daily as needed (pain).   famotidine (PEPCID) 20 MG tablet TAKE 1 TABLET BY MOUTH TWICE A DAY   furosemide (LASIX) 40 MG tablet TAKE 1 TABLET BY MOUTH TWICE A DAY   glucose blood (ONETOUCH VERIO) test strip 1 each by Other route 2 (two) times daily. And lancets 2/day   glucose blood test strip Check blood sugar twice a day   hydrALAZINE (APRESOLINE) 25 MG tablet TAKE 3 TABLETS (75 MG TOTAL) BY MOUTH 3 (THREE) TIMES DAILY.   insulin lispro (HUMALOG) 100 UNIT/ML injection Give 3 units with BREAKFAST, AND 5 units with SUPPER   latanoprost (XALATAN) 0.005 % ophthalmic solution Place 1 drop into both eyes at bedtime.   montelukast (SINGULAIR) 10 MG tablet TAKE 1 TABLET BY MOUTH EVERYDAY AT BEDTIME   nitroGLYCERIN (NITROSTAT) 0.4 MG SL tablet Place 1 tablet (0.4 mg total) under the tongue every 5 (five) minutes x 3 doses as needed for chest pain.   ondansetron (ZOFRAN) 8 MG tablet Take 1 tablet (8 mg total) by mouth every 8 (eight) hours as needed for nausea or vomiting.   pantoprazole (PROTONIX) 40 MG tablet Take 1 tablet (40 mg total) by mouth 2 (two) times daily.   potassium chloride (KLOR-CON) 10 MEQ tablet Take 1 tablet (10 mEq total) by mouth daily.   sodium bicarbonate 650 MG tablet Take 650 mg by mouth 2 (two) times daily.   Syringe/Needle, Disp, (SYRINGE 3CC/25GX1") 25G X 1" 3 ML MISC Use to inject into the skin 2x a day   traMADol (ULTRAM) 50 MG tablet Take 50 mg by mouth every 6 (six) hours as needed for moderate pain.   Current Facility-Administered Medications for the  07/31/22 encounter (Office Visit) with Tanda Rockers, MD  Medication   ipratropium-albuterol (DUONEB) 0.5-2.5 (3) MG/3ML nebulizer solution 3 mL                                          Past Medical History:   OSTEOPOROSIS (ICD-733.00)  HYPERTENSION (ICD-401.9)  HYPERLIPIDEMIA (ICD-272.4)  DIVERTICULITIS, HX OF (ICD-V12.79)  DIABETES MELLITUS, TYPE  II (ICD-250.00)  COLON CANCER, HX OF (ICD-V10.05)  ASTHMA (ICD-493.90)  -PFT's 03/18/08 minimal airflow obstruction  -PFT's January 04, 2010 No sign airflow obstruction  -Mastered HFA technique August 13, 2008 > confimed November 23, 2009  ANXIETY (ICD-300.00)  ANEMIA-IRON DEFICIENCY (ICD-280.9)  ALLERGIC RHINITIS (ICD-477.9)  IBS  GERD            Objective:   Physical Exam  Wts   07/31/2022  173  01/25/2022    167   07/04/2021   182 11/04/2020     181 05/04/2020    185 05/05/2019    196  wt 202 November 23, 2009> 199 January 04, 2010 > 209  03/22/11 > 214 10/16/2013 >  01/25/2017   193> 01/28/2018   200   Vital signs reviewed  07/31/2022  - Note at rest 02 sats  100% on RA   General appearance:   amb pleasant slt hoarse wf nad   HEENT : Oropharynx  clear     Nasal turbinates nl    NECK :  without  apparent JVD/ palpable Nodes/TM    LUNGS: no acc muscle use,  Nl contour chest which is clear to A and P bilaterally without cough on insp or exp maneuvers   CV:  RRR  no s3 or murmur or increase in P2, and no edema   ABD:  soft and nontender with nl inspiratory excursion in the supine position. No bruits or organomegaly appreciated   MS:  Nl gait/ ext warm without deformities Or obvious joint restrictions  calf tenderness, cyanosis or clubbing    SKIN: warm and dry without lesions    NEURO:  alert, approp, nl sensorium with  no motor or cerebellar deficits apparent.                     Assessment:

## 2022-07-31 ENCOUNTER — Encounter: Payer: Self-pay | Admitting: Internal Medicine

## 2022-07-31 ENCOUNTER — Ambulatory Visit (INDEPENDENT_AMBULATORY_CARE_PROVIDER_SITE_OTHER): Payer: Medicare Other

## 2022-07-31 ENCOUNTER — Ambulatory Visit (INDEPENDENT_AMBULATORY_CARE_PROVIDER_SITE_OTHER): Payer: Medicare Other | Admitting: Internal Medicine

## 2022-07-31 DIAGNOSIS — Z950 Presence of cardiac pacemaker: Secondary | ICD-10-CM | POA: Diagnosis not present

## 2022-07-31 DIAGNOSIS — I442 Atrioventricular block, complete: Secondary | ICD-10-CM | POA: Diagnosis not present

## 2022-07-31 DIAGNOSIS — J45991 Cough variant asthma: Secondary | ICD-10-CM | POA: Diagnosis not present

## 2022-07-31 LAB — CUP PACEART REMOTE DEVICE CHECK
Battery Remaining Longevity: 138 mo
Battery Voltage: 3.02 V
Brady Statistic RV Percent Paced: 98.55 %
Date Time Interrogation Session: 20231030054829
Implantable Lead Connection Status: 753985
Implantable Lead Implant Date: 20211101
Implantable Lead Location: 753860
Implantable Lead Model: 3830
Implantable Pulse Generator Implant Date: 20211101
Lead Channel Impedance Value: 342 Ohm
Lead Channel Impedance Value: 437 Ohm
Lead Channel Pacing Threshold Amplitude: 0.875 V
Lead Channel Pacing Threshold Pulse Width: 0.4 ms
Lead Channel Sensing Intrinsic Amplitude: 10.625 mV
Lead Channel Sensing Intrinsic Amplitude: 10.625 mV
Lead Channel Setting Pacing Amplitude: 2 V
Lead Channel Setting Pacing Pulse Width: 0.4 ms
Lead Channel Setting Sensing Sensitivity: 0.9 mV
Zone Setting Status: 755011

## 2022-07-31 LAB — POCT EXHALED NITRIC OXIDE: FeNO level (ppb): 25

## 2022-07-31 NOTE — Patient Instructions (Addendum)
No change in your medications ° °Please schedule a follow up visit in 12 months but call sooner if needed  °

## 2022-08-01 ENCOUNTER — Encounter: Payer: Medicare Other | Admitting: Skilled Nursing Facility1

## 2022-08-01 ENCOUNTER — Telehealth: Payer: Self-pay | Admitting: Family

## 2022-08-01 ENCOUNTER — Encounter: Payer: Self-pay | Admitting: Internal Medicine

## 2022-08-01 ENCOUNTER — Other Ambulatory Visit: Payer: Self-pay | Admitting: Family

## 2022-08-01 DIAGNOSIS — R109 Unspecified abdominal pain: Secondary | ICD-10-CM

## 2022-08-01 NOTE — Telephone Encounter (Signed)
Patient states she is still feeling bad and not back to normal per her 10/12 OV, she would like another antibiotic sent. Please advise.

## 2022-08-01 NOTE — Assessment & Plan Note (Addendum)
PFT's 03/18/08 minimal airflow obstruction  -PFT's January 04, 2010 No sign airflow obstruction   - FENO 01/25/2017  =   23 p am symb 160 x 2 pffs - Spirometry 01/25/2017  wnl x for min curvature on f/v p am symb 160 x 2 / no saba prior - 07/30/2017   After extensive coaching HFA effectiveness =    75% from baseline 50%  - Allergy profile 07/30/17  >  Eos 0.2 /  IgE  145  RAST pos mold  - FENO 01/28/2018  =   17 - Spirometry 01/28/2018  FEV1 1.22 (65%)  Ratio 79  - 07/31/2022  After extensive coaching inhaler device,  effectiveness =  90%  All goals of chronic asthma control met including optimal function and elimination of symptoms with minimal need for rescue therapy.  Contingencies discussed in full including contacting this office immediately if not controlling the symptoms using the rule of two's.     F/u can be yearly at this point, sooner prn          Each maintenance medication was reviewed in detail including emphasizing most importantly the difference between maintenance and prns and under what circumstances the prns are to be triggered using an action plan format where appropriate.  Total time for H and P, chart review, counseling, reviewing hfa device(s) and generating customized AVS unique to this office visit / same day charting = 16  min

## 2022-08-01 NOTE — Telephone Encounter (Signed)
I have called the pt back and relayed the message to the provider. She stated that she did not mind coming into the office and her appointment is now made with Percell Miller at 8:40a, Thursday 08/03/22

## 2022-08-01 NOTE — Telephone Encounter (Signed)
I have called the pt and she stated that " I am feeling somewhat better not, but not like I was", she was requesting another round of antibiotics. I have informed her that the next step is to see GI. Please advise if there is any additional recommendations at this time.

## 2022-08-02 DIAGNOSIS — M1711 Unilateral primary osteoarthritis, right knee: Secondary | ICD-10-CM | POA: Diagnosis not present

## 2022-08-02 DIAGNOSIS — M13861 Other specified arthritis, right knee: Secondary | ICD-10-CM | POA: Diagnosis not present

## 2022-08-03 ENCOUNTER — Ambulatory Visit (HOSPITAL_BASED_OUTPATIENT_CLINIC_OR_DEPARTMENT_OTHER)
Admission: RE | Admit: 2022-08-03 | Discharge: 2022-08-03 | Disposition: A | Discharge status: Home / Self Care | Payer: Medicare Other | Source: Ambulatory Visit | Attending: Medical | Admitting: Medical

## 2022-08-03 ENCOUNTER — Ambulatory Visit (INDEPENDENT_AMBULATORY_CARE_PROVIDER_SITE_OTHER): Payer: Medicare Other | Admitting: Medical

## 2022-08-03 VITALS — BP 134/68 | HR 80 | Temp 98.2°F | Resp 18 | Ht 67.0 in | Wt 175.0 lb

## 2022-08-03 DIAGNOSIS — K5909 Other constipation: Secondary | ICD-10-CM

## 2022-08-03 DIAGNOSIS — R1032 Left lower quadrant pain: Secondary | ICD-10-CM

## 2022-08-03 DIAGNOSIS — K5900 Constipation, unspecified: Secondary | ICD-10-CM | POA: Diagnosis not present

## 2022-08-03 DIAGNOSIS — R110 Nausea: Secondary | ICD-10-CM | POA: Diagnosis not present

## 2022-08-03 DIAGNOSIS — R109 Unspecified abdominal pain: Secondary | ICD-10-CM | POA: Diagnosis not present

## 2022-08-03 LAB — COMPREHENSIVE METABOLIC PANEL
ALT: 17 U/L (ref 0–35)
AST: 19 U/L (ref 0–37)
Albumin: 3.7 g/dL (ref 3.5–5.2)
Alkaline Phosphatase: 83 U/L (ref 39–117)
BUN: 52 mg/dL — ABNORMAL HIGH (ref 6–23)
CO2: 27 mEq/L (ref 19–32)
Calcium: 9.1 mg/dL (ref 8.4–10.5)
Chloride: 104 mEq/L (ref 96–112)
Creatinine, Ser: 3 mg/dL — ABNORMAL HIGH (ref 0.40–1.20)
GFR: 13.87 mL/min — CL (ref >60.00)
Glucose, Bld: 136 mg/dL — ABNORMAL HIGH (ref 70–99)
Potassium: 4.4 mEq/L (ref 3.5–5.1)
Sodium: 141 mEq/L (ref 135–145)
Total Bilirubin: 0.7 mg/dL (ref 0.2–1.2)
Total Protein: 6.6 g/dL (ref 6.0–8.3)

## 2022-08-03 LAB — CBC WITH DIFFERENTIAL/PLATELET
Basophils Absolute: 0.1 10*3/uL (ref 0.0–0.1)
Basophils Relative: 1.2 % (ref 0.0–3.0)
Eosinophils Absolute: 0.3 10*3/uL (ref 0.0–0.7)
Eosinophils Relative: 5.9 % — ABNORMAL HIGH (ref 0.0–5.0)
HCT: 35.4 % — ABNORMAL LOW (ref 36.0–46.0)
Hemoglobin: 11.7 g/dL — ABNORMAL LOW (ref 12.0–15.0)
Lymphocytes Relative: 19.7 % (ref 12.0–46.0)
Lymphs Abs: 1.1 10*3/uL (ref 0.7–4.0)
MCHC: 33.2 g/dL (ref 30.0–36.0)
MCV: 83.2 fl (ref 78.0–100.0)
Monocytes Absolute: 1 10*3/uL (ref 0.1–1.0)
Monocytes Relative: 16.8 % — ABNORMAL HIGH (ref 3.0–12.0)
Neutro Abs: 3.3 10*3/uL (ref 1.4–7.7)
Neutrophils Relative %: 56.4 % (ref 43.0–77.0)
Platelets: 144 10*3/uL — ABNORMAL LOW (ref 150.0–400.0)
RBC: 4.25 Mil/uL (ref 3.87–5.11)
RDW: 15.8 % — ABNORMAL HIGH (ref 11.5–15.5)
WBC: 5.8 10*3/uL (ref 4.0–10.5)

## 2022-08-03 NOTE — Patient Instructions (Addendum)
Llq pain. Persisting on and off since last visit mid October. Mild on exam today. I did talk with radiologist today. The one I talked to stated couple of diverticulosis in sigmoid area but no diverticulitis.  Will get cbc and cmp stat today. Will get 1 view abd xray to assess stool burden.   If stool burden in this area will advise you try miralax or possible magnesium citrate.  We are approaching the weekend. May need to repeat CT again before weekend.  Follow up date to be determined after lab and imaging review.

## 2022-08-03 NOTE — Progress Notes (Signed)
   Subjective:    Patient ID: [name], female    DOB: 22-Jan-1936, 86 y.o.   MRN: [id number]  HPI  Pt in for follow up.  Pt seen on 07-13-2022. Seen by her pcp.    Subjective on that date below in " "Started last Friday with abdominal pain/ nausea- seemed to start after eating a hot dog; pain is localized over LLQ; does feel that symptoms are improving some but continuing with nausea; has been told she has had diverticulitis in the past; "  A/P from that date  Assessment:  1. Left lower quadrant abdominal pain   2. Cough variant asthma   3. Allergic rhinitis, unspecified seasonality, unspecified trigger     "Plan:  ? Diverticulitis; will update abd/ pelvic CT without contrast; will go ahead and start Augmentin ( lower dosage used due to kidney disease); follow up to be determined; "   CT report showed below.    "IMPRESSION: 1. No acute abdominopelvic findings. 2. Colonic diverticulosis without acute diverticulitis. 3. Cholelithiasis without evidence of acute cholecystitis. 4. Punctate nonobstructing left lower pole renal calculus. 5. Small hiatal hernia. 6. Multichamber cardiomegaly with coronary artery calcifications and aortic valvular and mitral annular calcifications. 7. Similar bibasilar linear atelectasis/scarring with subsegmental mucous plugging and mild bronchiectasis left lower lobe. 8. Aortic Atherosclerosis (ICD10-I70.0)."  Pt was on augmentin for 7 days and she states  while on antibiotic she felt little better.    Pt states pain level 5-6/10.  2021 colonoscopy was normal.  No diarrhea.  Pt last bowel movement was on Monday. No recent miralax since Monday. Usually will respond wel.   Review of Systems  Constitutional:  Negative for fatigue and fever.  Respiratory:  Negative for cough, chest tightness and wheezing.   Cardiovascular:  Negative for chest pain and palpitations.  Gastrointestinal:  Positive for abdominal pain and nausea. Negative for  blood in stool, constipation, rectal pain and vomiting.  Genitourinary:  Negative for dysuria, flank pain and frequency.  Neurological:  Negative for syncope, facial asymmetry, speech difficulty, numbness and headaches.       Objective:   Physical Exam  General- No acute distress. Pleasant patient. Neck- Full range of motion, no jvd Lungs- Clear, even and unlabored. Heart- regular rate and rhythm. Neurologic- CNII- XII grossly intact.  Abdomen- soft, nd, +bs, no rebound or guarding. No organomegaly. Mild llq tender to palpation. Back-no cva tendernsness    Assessment & Plan:   Patient Instructions  Llq pain. Persisting on and off since last visit mid October. Mild on exam today. I did talk with radiologist today. The one I talked to stated couple of diverticulosis in sigmoid area but no diverticulitis.  Will get cbc and cmp stat today. Will get 1 view abd xray to assess stool burden.   If stool burden in this area will advise you try miralax or possible magnesium citrate.  We are approaching the weekend. May need to repeat CT again before weekend.  Follow up date to be determined after lab and imaging review.       Mackie Pai, PA-C    [phone]  Time spent with patient today was 41  minutes which consisted of chart review, discussing  differrential diagnosis, work up ,treatment, speaking with radiologist to understand where pt has diverticulosis  and documentation.

## 2022-08-03 NOTE — Telephone Encounter (Signed)
Received call from [facility] lab with a critical GFR value of 13.7

## 2022-08-03 NOTE — Telephone Encounter (Signed)
Has been addressed by provider who saw her; per that note, patient's nephrologist was notified.

## 2022-08-09 DIAGNOSIS — M1711 Unilateral primary osteoarthritis, right knee: Secondary | ICD-10-CM | POA: Diagnosis not present

## 2022-08-12 ENCOUNTER — Other Ambulatory Visit: Payer: Self-pay | Admitting: Internal Medicine

## 2022-08-14 ENCOUNTER — Other Ambulatory Visit: Payer: Self-pay | Admitting: Family

## 2022-08-14 ENCOUNTER — Telehealth: Payer: Self-pay | Admitting: Internal Medicine

## 2022-08-14 DIAGNOSIS — Z1231 Encounter for screening mammogram for malignant neoplasm of breast: Secondary | ICD-10-CM

## 2022-08-14 MED ORDER — HYDRALAZINE HCL 25 MG PO TABS: 75.0000 mg | ORAL_TABLET | Freq: Three times a day (TID) | ORAL | 8 refills | Status: DC

## 2022-08-14 NOTE — Telephone Encounter (Signed)
*  STAT* If patient is at the pharmacy, call can be transferred to refill team.   1. Which medications need to be refilled? (please list name of each medication and dose if known)   hydrALAZINE (APRESOLINE) 25 MG tablet   2. Which pharmacy/location (including street and city if local pharmacy) is medication to be sent to?  [id number] - [place], [place] - [address]   3. Do they need a 30 day or 90 day supply?  90 day  Patient stated she is completely out of this medication and has not taken any since Friday.

## 2022-08-14 NOTE — Telephone Encounter (Signed)
Pt's medication was sent to pt's pharmacy as requested. Confirmation received.  °

## 2022-08-17 DIAGNOSIS — M1711 Unilateral primary osteoarthritis, right knee: Secondary | ICD-10-CM | POA: Diagnosis not present

## 2022-08-22 DIAGNOSIS — N184 Chronic kidney disease, stage 4 (severe): Secondary | ICD-10-CM | POA: Diagnosis not present

## 2022-08-22 DIAGNOSIS — D509 Iron deficiency anemia, unspecified: Secondary | ICD-10-CM | POA: Diagnosis not present

## 2022-08-22 DIAGNOSIS — I5021 Acute systolic (congestive) heart failure: Secondary | ICD-10-CM | POA: Diagnosis not present

## 2022-08-22 DIAGNOSIS — I4891 Unspecified atrial fibrillation: Secondary | ICD-10-CM | POA: Diagnosis not present

## 2022-08-22 DIAGNOSIS — I129 Hypertensive chronic kidney disease with stage 1 through stage 4 chronic kidney disease, or unspecified chronic kidney disease: Secondary | ICD-10-CM | POA: Diagnosis not present

## 2022-08-22 DIAGNOSIS — E1122 Type 2 diabetes mellitus with diabetic chronic kidney disease: Secondary | ICD-10-CM | POA: Diagnosis not present

## 2022-08-22 DIAGNOSIS — E785 Hyperlipidemia, unspecified: Secondary | ICD-10-CM | POA: Diagnosis not present

## 2022-08-22 DIAGNOSIS — N179 Acute kidney failure, unspecified: Secondary | ICD-10-CM | POA: Diagnosis not present

## 2022-08-23 LAB — BASIC METABOLIC PANEL
BUN: 46 — AB (ref 4–21)
CO2: 24 — AB (ref 13–22)
Chloride: 104 (ref 99–108)
Creatinine: 2.6 — AB (ref 0.5–1.1)
Glucose: 129
Potassium: 4.4 mEq/L (ref 3.5–5.1)
Sodium: 141 (ref 137–147)

## 2022-08-23 LAB — COMPREHENSIVE METABOLIC PANEL
Calcium: 9.4 (ref 8.7–10.7)
eGFR: 18

## 2022-08-30 NOTE — Progress Notes (Signed)
Remote pacemaker transmission.   

## 2022-09-18 ENCOUNTER — Ambulatory Visit (INDEPENDENT_AMBULATORY_CARE_PROVIDER_SITE_OTHER): Payer: Medicare Other | Admitting: Podiatry

## 2022-09-18 ENCOUNTER — Encounter: Payer: Self-pay | Admitting: Podiatry

## 2022-09-18 VITALS — BP 156/84

## 2022-09-18 DIAGNOSIS — B351 Tinea unguium: Secondary | ICD-10-CM

## 2022-09-18 DIAGNOSIS — M79674 Pain in right toe(s): Secondary | ICD-10-CM

## 2022-09-18 DIAGNOSIS — L84 Corns and callosities: Secondary | ICD-10-CM | POA: Diagnosis not present

## 2022-09-18 DIAGNOSIS — Q828 Other specified congenital malformations of skin: Secondary | ICD-10-CM | POA: Diagnosis not present

## 2022-09-18 DIAGNOSIS — M79675 Pain in left toe(s): Secondary | ICD-10-CM

## 2022-09-18 DIAGNOSIS — E1151 Type 2 diabetes mellitus with diabetic peripheral angiopathy without gangrene: Secondary | ICD-10-CM | POA: Diagnosis not present

## 2022-09-18 NOTE — Progress Notes (Unsigned)
  Subjective:  Patient ID: [name], female    DOB: [birth year],  MRN: [id number]  [name] presents to clinic today for at risk foot care. Pt has h/o NIDDM with PAD and callus(es) left lower extremity, porokeratotic lesion(s) left lower extremity, and painful mycotic nails. Painful toenails interfere with ambulation. Aggravating factors include wearing enclosed shoe gear. Pain is relieved with periodic professional debridement. Painful callus(es) and porokeratotic lesion(s) are aggravated when weightbearing with and without shoegear. Pain is relieved with periodic professional debridement.  Chief Complaint  Patient presents with   Nail Problem    [facility] [address] BS-did not check today A1C-5.0 PCP-Laura Murray PCP VST-Early 2023   New problem(s): None.   PCP is [name], FNP.  Allergies  Allergen Reactions   Aspirin Shortness Of Breath and Other (See Comments)    Caused asthma symptoms   Ramipril Cough    Review of Systems: Negative except as noted in the HPI.  Objective: No changes noted in today's physical examination. Vitals:   09/18/22 0839  BP: (!) 156/84   [name] is a pleasant 86 y.o. female in NAD. AAO x 3.  Vascular Examination: CFT <3 seconds b/l. DP/PT pulses faintly palpable b/l. Skin temperature gradient warm to warm b/l. No pain with calf compression. No ischemia or gangrene. No cyanosis or clubbing noted b/l. Pedal hair absent.   Neurological Examination: Sensation grossly intact b/l with 10 gram monofilament. Vibratory sensation intact b/l.   Dermatological Examination: Pedal skin warm and supple b/l. Toenails 1-5 b/l thick, discolored, elongated with subungual debris and pain on dorsal palpation.  No open wounds b/l LE. No interdigital macerations noted b/l LE.  Hyperkeratotic lesion(s) sub 5th met base left lower extremity.  No erythema, no edema, no drainage, no fluctuance. Porokeratotic lesion(s) left heel. No erythema, no edema, no drainage, no  fluctuance.  Musculoskeletal Examination: Muscle strength 5/5 to b/l LE. HAV with bunion deformity noted b/l LE. Tailor's bunion deformity noted b/l LE. Hammertoe deformity noted 2-5 b/l.  Radiographs: None  Assessment/Plan: 1. Pain due to onychomycosis of toenails of both feet   2. Porokeratosis   3. Callus   4. Type II diabetes mellitus with peripheral circulatory disorder (HCC)     No orders of the defined types were placed in this encounter.   None  -Patient was evaluated and treated. All patient's and/or POA's questions/concerns answered on today's visit. -Continue foot and shoe inspections daily. Monitor blood glucose per PCP/Endocrinologist's recommendations. -Toenails 1-5 b/l were debrided in length and girth with sterile nail nippers and dremel without iatrogenic bleeding.  -Callus(es) sub 5th met base left foot pared utilizing sterile scalpel blade without complication or incident. Total number debrided =1. -Porokeratotic lesion(s) left heel pared and enucleated with sterile currette without incident. Total number of lesions debrided=1. -Patient/POA to call should there be question/concern in the interim.   Return in about 3 months (around 12/18/2022).  Marzetta Board, DPM

## 2022-09-19 DIAGNOSIS — Z794 Long term (current) use of insulin: Secondary | ICD-10-CM | POA: Diagnosis not present

## 2022-09-19 DIAGNOSIS — H401131 Primary open-angle glaucoma, bilateral, mild stage: Secondary | ICD-10-CM | POA: Diagnosis not present

## 2022-09-19 DIAGNOSIS — Z961 Presence of intraocular lens: Secondary | ICD-10-CM | POA: Diagnosis not present

## 2022-09-19 DIAGNOSIS — E119 Type 2 diabetes mellitus without complications: Secondary | ICD-10-CM | POA: Diagnosis not present

## 2022-09-19 LAB — HM DIABETES EYE EXAM

## 2022-09-21 ENCOUNTER — Encounter: Payer: Self-pay | Admitting: Endocrinology

## 2022-09-22 ENCOUNTER — Encounter: Payer: Medicare Other | Attending: Endocrinology | Admitting: Dietician

## 2022-09-22 ENCOUNTER — Other Ambulatory Visit: Payer: Self-pay | Admitting: Endocrinology

## 2022-09-22 ENCOUNTER — Encounter: Payer: Self-pay | Admitting: Dietician

## 2022-09-22 DIAGNOSIS — N1831 Chronic kidney disease, stage 3a: Secondary | ICD-10-CM | POA: Diagnosis not present

## 2022-09-22 DIAGNOSIS — E1122 Type 2 diabetes mellitus with diabetic chronic kidney disease: Secondary | ICD-10-CM | POA: Diagnosis not present

## 2022-09-22 DIAGNOSIS — Z794 Long term (current) use of insulin: Secondary | ICD-10-CM | POA: Insufficient documentation

## 2022-09-22 NOTE — Patient Instructions (Addendum)
Instructed patient to take 5 units of Novolog before dinner per MD note and Medication instruction.  (She has been taking  20 units before dinner.)  Choose whole grains. Eat vegetables with every meal Fruit 2-3 portions daily Small portions of protein with each meal (chicken, [place], fish, cheese, eggs) or have beans for your protein.   Read labels for sodium Drink mostly water.  Regular soda and gatorade make the blood glucose increase. Adequate hydration.

## 2022-09-22 NOTE — Progress Notes (Signed)
Diabetes Self-Management Education  Visit Type: First/Initial  Appt. Start Time: 0915 Appt. End Time: 2706  09/22/2022  [name], identified by name and date of birth, is a 86 y.o. female with a diagnosis of Diabetes: Type 2.   ASSESSMENT This is a phone visit as she is not feeling well with cold like symptoms. She is in her home and I am in my offic. She checks her blood glucose a couple times per week. - 120-135 usually and occasional 170 before meal.  Referral:  Type 2 Diabetes with stage 4 CKD History includes:  Type 2 diabetes, stage 4 CKD, HLD, HTN, CHF, MI Medications include:  3 units Novolog before breakfast and 20 units Novolog before dinner, Farxiga, lasix, potassium, vitamin C Labs:  A1C 5.5% 06/23/2022  Weight hx: 66" 178 lbs per patient 09/22/2022  Protein needs:  60 grams daily  Patient's daughter lives with her at times since her heart attack in 11/2021.  Her daughter does all the shopping and shares cooking.  She does not use added salt but some canned foods.  Rare red meat. She is retired from [employer].   Diabetes Self-Management Education - 09/22/22 0929       Visit Information   Visit Type First/Initial      Initial Visit   Diabetes Type Type 2    Date Diagnosed 1992    Are you currently following a meal plan? No    Are you taking your medications as prescribed? Yes      Health Coping   How would you rate your overall health? Good      Psychosocial Assessment   Patient Belief/Attitude about Diabetes Motivated to manage diabetes    What is the hardest part about your diabetes right now, causing you the most concern, or is the most worrisome to you about your diabetes?   Checking blood sugar    Self-care barriers Debilitated state due to current medical condition    Self-management support Doctor's office;Family    Other persons present Patient    Patient Concerns Nutrition/Meal planning    Special Needs None    Preferred Learning Style  Auditory    Learning Readiness Ready    How often do you need to have someone help you when you read instructions, pamphlets, or other written materials from your doctor or pharmacy? 1 - Never    What is the last grade level you completed in school? 2 years college      Pre-Education Assessment   Patient understands the diabetes disease and treatment process. Needs Review    Patient understands incorporating nutritional management into lifestyle. Needs Review    Patient undertands incorporating physical activity into lifestyle. Needs Review    Patient understands using medications safely. Needs Review    Patient understands monitoring blood glucose, interpreting and using results Needs Review    Patient understands prevention, detection, and treatment of acute complications. Needs Review    Patient understands prevention, detection, and treatment of chronic complications. Needs Review    Patient understands how to develop strategies to address psychosocial issues. Needs Review    Patient understands how to develop strategies to promote health/change behavior. Needs Review      Complications   Last HgB A1C per patient/outside source 5.5 %   06/23/2022   How often do you check your blood sugar? --   twice per week   Fasting Blood glucose range (mg/dL) 70-129;130-179    Number of hypoglycemic episodes per month  0    Have you had a dilated eye exam in the past 12 months? Yes    Have you had a dental exam in the past 12 months? No    Are you checking your feet? Yes    How many days per week are you checking your feet? 7      Dietary Intake   Breakfast boiled egg, toast, occasional apple or tangerine    Snack (morning) none    Lunch [place] sandwich on white bread OR salad (lettuce, tomato, cheese, occasional chicken) with 1000 island or ranch   1-2   Snack (afternoon) none    Patent examiner, potatoes, spinach/collards (canned vegetables)    Snack (evening) none    Beverage(s) water, diet  (occasional regular) gingerale, regular gatorade      Activity / Exercise   Activity / Exercise Type Light (walking / raking leaves)    How many days per week do you exercise? 3    How many minutes per day do you exercise? 15    Total minutes per week of exercise 45      Patient Education   Previous Diabetes Education Yes (please comment)   classes when diagnosed   Disease Pathophysiology Other (comment)   brief review   Being Active Role of exercise on diabetes management, blood pressure control and cardiac health.    Medications Reviewed patients medication for diabetes, action, purpose, timing of dose and side effects.      Individualized Goals (developed by patient)   Nutrition General guidelines for healthy choices and portions discussed   renal diabetes   Physical Activity Exercise 3-5 times per week;15 minutes per day    Medications take my medication as prescribed    Monitoring  Test my blood glucose as discussed    Problem Solving Eating Pattern    Reducing Risk Other (comment)   lower sodium intake     Post-Education Assessment   Patient understands the diabetes disease and treatment process. Comprehends key points    Patient understands incorporating nutritional management into lifestyle. Comprehends key points    Patient undertands incorporating physical activity into lifestyle. Comprehends key points    Patient understands using medications safely. Comphrehends key points    Patient understands monitoring blood glucose, interpreting and using results Comprehends key points    Patient understands prevention, detection, and treatment of acute complications. Comprehends key points    Patient understands prevention, detection, and treatment of chronic complications. Comprehends key points    Patient understands how to develop strategies to address psychosocial issues. Comprehends key points    Patient understands how to develop strategies to promote health/change behavior.  Comprehends key points      Outcomes   Expected Outcomes Demonstrated interest in learning. Expect positive outcomes    Future DMSE PRN    Program Status Completed             Individualized Plan for Diabetes Self-Management Training:   Learning Objective:  Patient will have a greater understanding of diabetes self-management. Patient education plan is to attend individual and/or group sessions per assessed needs and concerns.   Plan:   Patient Instructions  Instructed patient to take 5 units of Novolog before dinner per MD note and Medication instruction.  (She has been taking  20 units before dinner.)  Choose whole grains. Eat vegetables with every meal Fruit 2-3 portions daily Small portions of protein with each meal (chicken, [place], fish, cheese, eggs) or have beans for your  protein.   Read labels for sodium Drink mostly water.  Regular soda and gatorade make the blood glucose increase. Adequate hydration.  Expected Outcomes:  Demonstrated interest in learning. Expect positive outcomes  Education material provided: NKD national kidney diet - Dish up a Kidney-Friendly Meal for Patients with Chronic Kidney Disease (not on dialysis), Chronic Kidney disease stages 3-5 nutrition therapy from AND  If problems or questions, patient to contact team via:  Phone  Future DSME appointment: PRN

## 2022-09-27 ENCOUNTER — Ambulatory Visit: Payer: Medicare Other

## 2022-09-28 ENCOUNTER — Ambulatory Visit: Payer: Medicare Other

## 2022-10-03 NOTE — Progress Notes (Unsigned)
Cardiology Office Note   Date:  10/04/2022   ID:  [name], DOB [birth year], MRN [id number]  PCP:  [name], FNP  Cardiologist:   Dorris Carnes, MD   F/U of PAF, CAD and HTN     History of Present Illness: [name] is a 87 y.o. female with a history of HTN, DM, mod CAD (CT in 2017 with calcium score of  51  mod dz LAD; filling defect in LAA), paroxysmal atrial fibrillation  The pt was hospitalized in 2021 with dyspnea.  Found to be in CHB   She is s/p PPM implant, followed by [facility] P[facility]     Patient admitted 12/28/21 with NSTEMI.  She underwent  PTCA/ DES to the  LAD,probably lost her diagonal. ILVEF was 30-35%,  Hospital course was  complicated by AKI, persistent nausea, pneumonia, knee pain with aspiration and injection with cortisone. Crt 4.28 at discharge-followed by renal.   I sw the pt in July 2023  Since seen the pt denies CP   Breathing OK   No palpitations   No dizziness   Some concern about stomach  Br:   Coffee   Toast   Egg   10/11 AM Lunch   Salad or sandwich   3/4 PM   Dinner    Potatoes, chicken, peas, GB 7PM Drinks   Water or gat   Current Meds  Medication Sig   acetaminophen (TYLENOL) 325 MG tablet Take 650 mg by mouth every 6 (six) hours as needed for pain.   albuterol (PROAIR HFA) 108 (90 Base) MCG/ACT inhaler 2 puffs every 4 hours as needed only  if your can't catch your breath   albuterol (PROVENTIL) (2.5 MG/3ML) 0.083% nebulizer solution Take 3 mLs (2.5 mg total) by nebulization every 6 (six) hours as needed for wheezing or shortness of breath.   apixaban (ELIQUIS) 2.5 MG TABS tablet TAKE 1 TABLET BY MOUTH TWICE A DAY   Ascorbic Acid (VITAMIN C) 1000 MG tablet Take 1,000 mg by mouth daily.   atorvastatin (LIPITOR) 80 MG tablet Take 1 tablet (80 mg total) by mouth daily.   BD VEO INSULIN SYRINGE U/F 31G X 15/64" 0.5 ML MISC Use as instructed   Blood Glucose Monitoring Suppl (ONETOUCH VERIO FLEX SYSTEM) w/Device KIT USE AS DIRECTED    budesonide-formoterol (SYMBICORT) 160-4.5 MCG/ACT inhaler TAKE 2 PUFFS FIRST THING IN AM AND THEN ANOTHER 2 PUFFS ABOUT 12 HOURS LATER.   carvedilol (COREG) 3.125 MG tablet Take 1 tablet (3.125 mg total) by mouth 2 (two) times daily with a meal.   cetirizine (ZYRTEC ALLERGY) 10 MG tablet Take 1 tablet (10 mg total) by mouth daily as needed for allergies.   cholecalciferol (VITAMIN D3) 25 MCG (1000 UNIT) tablet Take 1,000 Units by mouth daily. Isn't taking regularly   clopidogrel (PLAVIX) 75 MG tablet Take 1 tablet (75 mg total) by mouth daily with breakfast.   diclofenac Sodium (VOLTAREN) 1 % GEL Apply 4 g topically 4 (four) times daily as needed (pain).   famotidine (PEPCID) 20 MG tablet TAKE 1 TABLET BY MOUTH TWICE A DAY   FARXIGA 5 MG TABS tablet TAKE 1 TABLET BY MOUTH DAILY BEFORE BREAKFAST.   furosemide (LASIX) 40 MG tablet TAKE 1 TABLET BY MOUTH TWICE A DAY   glucose blood (ONETOUCH VERIO) test strip 1 each by Other route 2 (two) times daily. And lancets 2/day   glucose blood test strip Check blood sugar twice a day   hydrALAZINE (  APRESOLINE) 25 MG tablet Take 3 tablets (75 mg total) by mouth 3 (three) times daily.   insulin lispro (HUMALOG) 100 UNIT/ML injection Give 3 units with BREAKFAST, AND 5 units with SUPPER   latanoprost (XALATAN) 0.005 % ophthalmic solution Place 1 drop into both eyes at bedtime.   montelukast (SINGULAIR) 10 MG tablet TAKE 1 TABLET BY MOUTH EVERYDAY AT BEDTIME   nitroGLYCERIN (NITROSTAT) 0.4 MG SL tablet Place 1 tablet (0.4 mg total) under the tongue every 5 (five) minutes x 3 doses as needed for chest pain.   pantoprazole (PROTONIX) 40 MG tablet Take 1 tablet (40 mg total) by mouth 2 (two) times daily.   potassium chloride (KLOR-CON) 10 MEQ tablet Take 1 tablet (10 mEq total) by mouth daily.   sodium bicarbonate 650 MG tablet Take 650 mg by mouth 2 (two) times daily.   Syringe/Needle, Disp, (SYRINGE 3CC/25GX1") 25G X 1" 3 ML MISC Use to inject into the skin 2x a  day   traMADol (ULTRAM) 50 MG tablet Take 50 mg by mouth every 6 (six) hours as needed for moderate pain.   vitamin B-12 (CYANOCOBALAMIN) 100 MCG tablet Take 100 mcg by mouth daily.   [DISCONTINUED] ondansetron (ZOFRAN) 8 MG tablet Take 1 tablet (8 mg total) by mouth every 8 (eight) hours as needed for nausea or vomiting.   Current Facility-Administered Medications for the 10/04/22 encounter (Office Visit) with Fay Records, MD  Medication   ipratropium-albuterol (DUONEB) 0.5-2.5 (3) MG/3ML nebulizer solution 3 mL        Allergies:   Aspirin and Ramipril   Past Medical History:  Diagnosis Date   Allergic rhinitis    Anterior chest wall pain    Anxiety    Asthma    Chronic diastolic CHF (congestive heart failure) ([place])    Echo 01/2020: EF 60-65, normal wall motion, mild LVH, normal RV SF, RVSP 52.6 (moderate elevation), severe LAE, moderate RAE, trivial MR, mild MS (mean gradient 5.5 mmHg), mild aortic valve sclerosis (no AS); elevated E/e' c/w elevated LVEDP   Cough    DM type 2 (diabetes mellitus, type 2) (H[facility])    GERD (gastroesophageal reflux disease)    History of diverticulitis of colon    HTN (hypertension)    Hyperlipidemia    IBS (irritable bowel syndrome)    Iron deficiency anemia    Mitral stenosis 04/17/2022   Echo 04/2022: EF 55-60, no RWMA, mild LVH, normal RVSF, normal PASP, moderate LAE, moderate mitral stenosis (mean gradient 4 mmHg), trivial MR, trivial AI, AV sclerosis without stenosis   Osteoporosis, unspecified    Renal insufficiency 07/31/2017   UTI (urinary tract infection)     Past Surgical History:  Procedure Laterality Date   CARDIOVASCULAR STRESS TEST  02/25/04   CORONARY BALLOON ANGIOPLASTY N/A 12/28/2021   Procedure: CORONARY BALLOON ANGIOPLASTY;  Surgeon: Burnell Blanks, MD;  Location: [facility];  Service: Cardiovascular;  Laterality: N/A;   CORONARY STENT INTERVENTION N/A 12/28/2021   Procedure: CORONARY STENT INTERVENTION;  Surgeon:  Burnell Blanks, MD;  Location: [facility];  Service: Cardiovascular;  Laterality: N/A;   ESOPHAGOGASTRODUODENOSCOPY  12/26/01   PACEMAKER IMPLANT N/A 08/02/2020   Procedure: PACEMAKER IMPLANT;  Surgeon: Deboraha Sprang, MD;  Location: [facility];  Service: Cardiovascular;  Laterality: N/A;   RIGHT OOPHORECTOMY  jan 2010   RIGHT/LEFT HEART CATH AND CORONARY ANGIOGRAPHY N/A 12/28/2021   Procedure: RIGHT/LEFT HEART CATH AND CORONARY ANGIOGRAPHY;  Surgeon: Burnell Blanks,  MD;  Location: [facility];  Service: Cardiovascular;  Laterality: N/A;     Social History:  The patient  reports that she quit smoking about 36 years ago. Her smoking use included cigarettes. She has a 1.50 pack-year smoking history. She has never used smokeless tobacco. She reports that she does not drink alcohol and does not use drugs.   Family History:  The patient's family history includes Cancer in her sister; Diabetes in her daughter; Hypertension in her mother; Lung cancer in her brother; Melanoma in her brother; Other in her father; Stroke in her mother.    ROS:  Please see the history of present illness. All other systems are reviewed and  Negative to the above problem except as noted.    PHYSICAL EXAM: VS:  BP 118/66   Pulse 77   Ht _0  (1.702 m)   Wt 176 lb 6.4 oz (80 kg)   SpO2 98%   BMI 27.63 kg/m   GEN:  Overweight 87 yo  in no acute distress  HEENT: normal  Neck: JVP normal   Cardiac: RRR Gr I/VI systolic murmru   No diastolic murmur     NO LE edema  Respiratory:  clear to auscultation bilaterally, GI: soft, nontender, nondistended, + BS  No hepatomegaly  MS: no deformity Moving all extremities   Skin: warm and dry, no rash Neuro:  Strength and sensation are intact Psych: euthymic mood, full affect   EKG:  EKG is not ordered today    Echo   04/17/22  ompared to echo from March 2023, LVEF is improved. . Left ventricular ejection fraction, by estimation, is  55 to 60%. The left ventricle has normal function. The left ventricle has no regional wall motion abnormalities. The left ventricular internal cavity size was mildly dilated. There is mild left ventricular hypertrophy. Left ventricular diastolic parameters are indeterminate. 1. Right ventricular systolic function is normal. The right ventricular size is normal. There is normal pulmonary artery systolic pressure. 2. 3. Left atrial size was moderately dilated. MV is thickened with restricted motion. Peak and mean gradients through the valve are 12 and 4 mm Hg MVA (VTI) is calculated at 1.12 cm2 Consistent with moderate MS. (HR 60 bpm) Compared to previous echo mean gradient is mildly decreased (6.5 to 4 mm Hg). Trivial mitral valve regurgitation. Moderate mitral annular calcification. 4. The aortic valve is tricuspid. Aortic valve regurgitation is trivial. Aortic valve sclerosis is present, with no evidence of aortic valve stenosis. 5. The inferior vena cava is normal in size with greater than 50% respiratory variability, suggesting right atrial pressure of 3 mmHg. R and L heart cath  12/28/21    Mid RCA lesion is 30% stenosed.   Dist RCA lesion is 50% stenosed.   1st Diag lesion is 90% stenosed.   Prox LAD to Mid LAD lesion is 95% stenosed.   A drug-eluting stent was successfully placed using a SYNERGY XD 3.0X16.   Balloon angioplasty was performed using a BALLN SAPPHIRE 2.0X12.   Post intervention, there is a 0% residual stenosis.   Post intervention, there is a 30% residual stenosis.   Severe proximal LAD stenosis involving a moderate caliber diagonal branch.  Successful PTCA/DES x 1 proximal LAD Successful balloon angioplasty Diagonal 1 No obstructive disease in the Circumflex artery Moderate non-obstructive disease in the dominant RCA Elevated right and left heart pressures.    Recommendations: Continue DAPT with ASA and Plavix for one month and can then stop ASA  since she will  also be on Eliquis. She remains volume overload. Will need continued diuresis with caution given renal insufficiency    Lipid Panel    Component Value Date/Time   CHOL 120 12/23/2021 1030   CHOL 135 07/28/2021 0852   TRIG 46 12/23/2021 1030   TRIG 83 08/07/2006 1419   HDL 55 12/23/2021 1030   HDL 64 07/28/2021 0852   CHOLHDL 2.2 12/23/2021 1030   VLDL 9 12/23/2021 1030   LDLCALC 56 12/23/2021 1030   LDLCALC 58 07/28/2021 0852      Wt Readings from Last 3 Encounters:  10/04/22 176 lb 6.4 oz (80 kg)  08/03/22 175 lb (79.4 kg)  07/31/22 173 lb 9.6 oz (78.7 kg)      ASSESSMENT AND PLAN:    CAD   S/P NSTEMI  with intervention to LAD in 2023   No symptoms of angina   Keep on Plavix for now      Wll review end day    2  Hx Atrial fibrillation   Permanent   Continue ELiquis  3  PPM  Hx of CHB  Continue to follow with EP    3  MV dz   Moderate MS by echo   Follow   Clincially No SOB     Will probably get echo after next visit    4  HTN  BP is well controlled     5  HL   DL 56  HDL 55    6.   Renal  Cr 2.6 in Nov 2023   Seen in renal clinc   7  Diet   Discussed diet    Cut out sugars, cut down on carbs    Increase fiber    More protein       Signed, Dorris Carnes, MD  10/04/2022 8:57 AM    [facility] [address], [place], [place]  [address] Phone: [phone]; Fax: [phone]

## 2022-10-04 ENCOUNTER — Ambulatory Visit: Payer: Medicare Other | Attending: Internal Medicine | Admitting: Internal Medicine

## 2022-10-04 ENCOUNTER — Encounter: Payer: Self-pay | Admitting: Internal Medicine

## 2022-10-04 VITALS — BP 118/66 | HR 77 | Ht 67.0 in | Wt 176.4 lb

## 2022-10-04 DIAGNOSIS — I2510 Atherosclerotic heart disease of native coronary artery without angina pectoris: Secondary | ICD-10-CM | POA: Diagnosis not present

## 2022-10-04 MED ORDER — ONDANSETRON HCL 8 MG PO TABS: 8.0000 mg | ORAL_TABLET | Freq: Three times a day (TID) | ORAL | 0 refills | Status: DC | PRN

## 2022-10-04 NOTE — Patient Instructions (Signed)
Medication Instructions:  Zofran as needed for Nausea *If you need a refill on your cardiac medications before your next appointment, please call your pharmacy*   Lab Work:  If you have labs (blood work) drawn today and your tests are completely normal, you will receive your results only by: [place] (if you have MyChart) OR A paper copy in the mail If you have any lab test that is abnormal or we need to change your treatment, we will call you to review the results.   Testing/Procedures:    Follow-Up: At [facility], you and your health needs are our priority.  As part of our continuing mission to provide you with exceptional heart care, we have created designated Provider Care Teams.  These Care Teams include your primary Cardiologist (physician) and Advanced Practice Providers (APPs -  Physician Assistants and Nurse Practitioners) who all work together to provide you with the care you need, when you need it.  We recommend signing up for the patient portal called "MyChart".  Sign up information is provided on this After Visit Summary.  MyChart is used to connect with patients for Virtual Visits (Telemedicine).  Patients are able to view lab/test results, encounter notes, upcoming appointments, etc.  Non-urgent messages can be sent to your provider as well.   To learn more about what you can do with MyChart, go to [email or web address].    Your next appointment:   6 month(s)  The format for your next appointment:   In Person  Provider:   Dorris Carnes, MD     Other Instructions   Important Information About Sugar

## 2022-10-06 ENCOUNTER — Other Ambulatory Visit: Payer: Self-pay | Admitting: Internal Medicine

## 2022-10-13 ENCOUNTER — Other Ambulatory Visit (INDEPENDENT_AMBULATORY_CARE_PROVIDER_SITE_OTHER): Payer: 59

## 2022-10-13 DIAGNOSIS — E1122 Type 2 diabetes mellitus with diabetic chronic kidney disease: Secondary | ICD-10-CM | POA: Diagnosis not present

## 2022-10-13 DIAGNOSIS — Z794 Long term (current) use of insulin: Secondary | ICD-10-CM | POA: Diagnosis not present

## 2022-10-13 DIAGNOSIS — N183 Chronic kidney disease, stage 3 unspecified: Secondary | ICD-10-CM | POA: Diagnosis not present

## 2022-10-13 LAB — BASIC METABOLIC PANEL
BUN: 49 mg/dL — ABNORMAL HIGH (ref 6–23)
CO2: 24 mEq/L (ref 19–32)
Calcium: 9.5 mg/dL (ref 8.4–10.5)
Chloride: 105 mEq/L (ref 96–112)
Creatinine, Ser: 2.54 mg/dL — ABNORMAL HIGH (ref 0.40–1.20)
GFR: 16.91 mL/min — ABNORMAL LOW (ref >60.00)
Glucose, Bld: 126 mg/dL — ABNORMAL HIGH (ref 70–99)
Potassium: 4 mEq/L (ref 3.5–5.1)
Sodium: 141 mEq/L (ref 135–145)

## 2022-10-13 LAB — HEMOGLOBIN A1C: Hgb A1c MFr Bld: 5.8 % (ref 4.6–6.5)

## 2022-10-14 LAB — FRUCTOSAMINE: Fructosamine: 302 umol/L — ABNORMAL HIGH (ref 0–285)

## 2022-10-17 ENCOUNTER — Encounter: Payer: Self-pay | Admitting: Endocrinology

## 2022-10-17 ENCOUNTER — Ambulatory Visit (INDEPENDENT_AMBULATORY_CARE_PROVIDER_SITE_OTHER): Payer: 59 | Admitting: Endocrinology

## 2022-10-17 VITALS — BP 144/82 | HR 78 | Ht 67.0 in | Wt 174.2 lb

## 2022-10-17 DIAGNOSIS — Z794 Long term (current) use of insulin: Secondary | ICD-10-CM | POA: Diagnosis not present

## 2022-10-17 DIAGNOSIS — E1122 Type 2 diabetes mellitus with diabetic chronic kidney disease: Secondary | ICD-10-CM | POA: Diagnosis not present

## 2022-10-17 DIAGNOSIS — N183 Chronic kidney disease, stage 3 unspecified: Secondary | ICD-10-CM

## 2022-10-17 LAB — POCT GLUCOSE (DEVICE FOR HOME USE): Glucose Fasting, POC: 142 mg/dL — AB (ref 70–99)

## 2022-10-17 NOTE — Progress Notes (Signed)
Patient ID: [name], female   DOB: [birth year], 87 y.o.   MRN: [id number]           Reason for Appointment: Type II Diabetes follow-up   History of Present Illness   Diagnosis date: 1996  Previous history:  Non-insulin hypoglycemic drugs previously used: Unknown Insulin was started in 2008 Previously on Lantus insulin that was stopped in 2021  A1c range in the last few years is: 5.2-6.2  Recent history:     Non-insulin hypoglycemic drugs: Farxiga 5 mg daily       Insulin regimen:    Humalog 3 units before breakfast and 5 before dinner        Side effects from medications: None  Current self management, blood sugar patterns and problems identified:  A1c is still relatively low at 5.8 Fructosamine is 302, higher than prior values  She is taking her blood sugars very sporadically and not clear what monitor she is using whether she is using expired test trips Again did not bring her monitor fasting blood sugars do not appear to be consistently high Taking insulin regularly Her lab glucose was 126 fasting on the lab and 142 today fasting  Exercise: minimal  Diet management:     Hypoglycemia:  none reported  Glucometer: One Touch.           Blood Glucose readings from recall: Not available  Dietician visit: Most recent:    None  Weight control:  Wt Readings from Last 3 Encounters:  10/17/22 174 lb 3.2 oz (79 kg)  10/04/22 176 lb 6.4 oz (80 kg)  08/03/22 175 lb (79.4 kg)            Diabetes labs:  Lab Results  Component Value Date   HGBA1C 5.8 10/13/2022   HGBA1C 5.5 06/23/2022   HGBA1C 5.0 12/23/2021   Lab Results  Component Value Date   MICROALBUR 17.0 (H) 04/12/2016   LDLCALC 56 12/23/2021   CREATININE 2.54 (H) 10/13/2022    Lab Results  Component Value Date   FRUCTOSAMINE 302 (H) 10/13/2022   FRUCTOSAMINE 262 07/29/2020   FRUCTOSAMINE 292 (H) 04/12/2016     Allergies as of 10/17/2022       Reactions   Aspirin Shortness Of Breath, Other  (See Comments)   Caused asthma symptoms   Ramipril Cough        Medication List        Accurate as of October 17, 2022  9:04 AM. If you have any questions, ask your nurse or doctor.          acetaminophen 325 MG tablet Commonly known as: TYLENOL Take 650 mg by mouth every 6 (six) hours as needed for pain.   albuterol (2.5 MG/3ML) 0.083% nebulizer solution Commonly known as: PROVENTIL Take 3 mLs (2.5 mg total) by nebulization every 6 (six) hours as needed for wheezing or shortness of breath.   albuterol 108 (90 Base) MCG/ACT inhaler Commonly known as: ProAir HFA 2 puffs every 4 hours as needed only  if your can't catch your breath   atorvastatin 80 MG tablet Commonly known as: LIPITOR Take 1 tablet (80 mg total) by mouth daily.   BD Veo Insulin Syringe U/F 31G X 15/64" 0.5 ML Misc Generic drug: Insulin Syringe-Needle U-100 Use as instructed   budesonide-formoterol 160-4.5 MCG/ACT inhaler Commonly known as: Symbicort TAKE 2 PUFFS FIRST THING IN AM AND THEN ANOTHER 2 PUFFS ABOUT 12 HOURS LATER.   carvedilol 3.125 MG tablet Commonly known  as: COREG Take 1 tablet (3.125 mg total) by mouth 2 (two) times daily with a meal.   cetirizine 10 MG tablet Commonly known as: ZyrTEC Allergy Take 1 tablet (10 mg total) by mouth daily as needed for allergies.   cholecalciferol 25 MCG (1000 UNIT) tablet Commonly known as: VITAMIN D3 Take 1,000 Units by mouth daily. Isn't taking regularly   clopidogrel 75 MG tablet Commonly known as: PLAVIX Take 1 tablet (75 mg total) by mouth daily with breakfast.   diclofenac Sodium 1 % Gel Commonly known as: VOLTAREN Apply 4 g topically 4 (four) times daily as needed (pain).   Eliquis 2.5 MG Tabs tablet Generic drug: apixaban TAKE 1 TABLET BY MOUTH TWICE A DAY   famotidine 20 MG tablet Commonly known as: PEPCID TAKE 1 TABLET BY MOUTH TWICE A DAY   Farxiga 5 MG Tabs tablet Generic drug: dapagliflozin propanediol TAKE 1 TABLET BY  MOUTH DAILY BEFORE BREAKFAST.   furosemide 40 MG tablet Commonly known as: LASIX TAKE 1 TABLET BY MOUTH TWICE A DAY   glucose blood test strip Check blood sugar twice a day   OneTouch Verio test strip Generic drug: glucose blood 1 each by Other route 2 (two) times daily. And lancets 2/day   hydrALAZINE 25 MG tablet Commonly known as: APRESOLINE Take 3 tablets (75 mg total) by mouth 3 (three) times daily.   insulin lispro 100 UNIT/ML injection Commonly known as: HumaLOG Give 3 units with BREAKFAST, AND 5 units with SUPPER   latanoprost 0.005 % ophthalmic solution Commonly known as: XALATAN Place 1 drop into both eyes at bedtime.   montelukast 10 MG tablet Commonly known as: SINGULAIR TAKE 1 TABLET BY MOUTH EVERYDAY AT BEDTIME   nitroGLYCERIN 0.4 MG SL tablet Commonly known as: NITROSTAT Place 1 tablet (0.4 mg total) under the tongue every 5 (five) minutes x 3 doses as needed for chest pain.   ondansetron 8 MG tablet Commonly known as: Zofran Take 1 tablet (8 mg total) by mouth every 8 (eight) hours as needed for nausea or vomiting.   OneTouch Verio Flex System w/Device Kit USE AS DIRECTED   pantoprazole 40 MG tablet Commonly known as: PROTONIX Take 1 tablet (40 mg total) by mouth 2 (two) times daily.   potassium chloride 10 MEQ tablet Commonly known as: KLOR-CON Take 1 tablet (10 mEq total) by mouth daily.   sodium bicarbonate 650 MG tablet Take 650 mg by mouth 2 (two) times daily.   SYRINGE 3CC/25GX1" 25G X 1" 3 ML Misc Use to inject into the skin 2x a day   traMADol 50 MG tablet Commonly known as: ULTRAM Take 50 mg by mouth every 6 (six) hours as needed for moderate pain.   vitamin B-12 100 MCG tablet Commonly known as: CYANOCOBALAMIN Take 100 mcg by mouth daily.   vitamin C 1000 MG tablet Take 1,000 mg by mouth daily.        Allergies:  Allergies  Allergen Reactions   Aspirin Shortness Of Breath and Other (See Comments)    Caused asthma  symptoms   Ramipril Cough    Past Medical History:  Diagnosis Date   Allergic rhinitis    Anterior chest wall pain    Anxiety    Asthma    Chronic diastolic CHF (congestive heart failure) (HCC)    Echo 01/2020: EF 60-65, normal wall motion, mild LVH, normal RV SF, RVSP 52.6 (moderate elevation), severe LAE, moderate RAE, trivial MR, mild MS (mean gradient 5.5 mmHg), mild  aortic valve sclerosis (no AS); elevated E/e' c/w elevated LVEDP   Cough    DM type 2 (diabetes mellitus, type 2) (HCC)    GERD (gastroesophageal reflux disease)    History of diverticulitis of colon    HTN (hypertension)    Hyperlipidemia    IBS (irritable bowel syndrome)    Iron deficiency anemia    Mitral stenosis 04/17/2022   Echo 04/2022: EF 55-60, no RWMA, mild LVH, normal RVSF, normal PASP, moderate LAE, moderate mitral stenosis (mean gradient 4 mmHg), trivial MR, trivial AI, AV sclerosis without stenosis   Osteoporosis, unspecified    Renal insufficiency 07/31/2017   UTI (urinary tract infection)     Past Surgical History:  Procedure Laterality Date   CARDIOVASCULAR STRESS TEST  02/25/04   CORONARY BALLOON ANGIOPLASTY N/A 12/28/2021   Procedure: CORONARY BALLOON ANGIOPLASTY;  Surgeon: Burnell Blanks, MD;  Location: [facility];  Service: Cardiovascular;  Laterality: N/A;   CORONARY STENT INTERVENTION N/A 12/28/2021   Procedure: CORONARY STENT INTERVENTION;  Surgeon: Burnell Blanks, MD;  Location: [facility];  Service: Cardiovascular;  Laterality: N/A;   ESOPHAGOGASTRODUODENOSCOPY  12/26/01   PACEMAKER IMPLANT N/A 08/02/2020   Procedure: PACEMAKER IMPLANT;  Surgeon: Deboraha Sprang, MD;  Location: [facility];  Service: Cardiovascular;  Laterality: N/A;   RIGHT OOPHORECTOMY  jan 2010   RIGHT/LEFT HEART CATH AND CORONARY ANGIOGRAPHY N/A 12/28/2021   Procedure: RIGHT/LEFT HEART CATH AND CORONARY ANGIOGRAPHY;  Surgeon: Burnell Blanks, MD;  Location: [facility];  Service: Cardiovascular;  Laterality: N/A;    Family History  Problem Relation Age of Onset   Lung cancer Brother    Melanoma Brother    Hypertension Mother    Stroke Mother    Other Father        poor circulation   Cancer Sister    Diabetes Daughter    Atopy Neg Hx    Asthma Neg Hx    Breast cancer Neg Hx     Social History:  reports that she quit smoking about 36 years ago. Her smoking use included cigarettes. She has a 1.50 pack-year smoking history. She has never used smokeless tobacco. She reports that she does not drink alcohol and does not use drugs.  Review of Systems:  Last diabetic eye exam date 8/23  Last urine microalbumin date: With nephrologist  Last foot exam date: 9/23 with podiatrist  Symptoms of neuropathy:none  Hypertension: Managed by nephrologist/cardiologist  ACE/ARB medication: None  BP Readings from Last 3 Encounters:  10/17/22 (!) 144/82  10/04/22 118/66  09/18/22 (!) 156/84   Lab Results  Component Value Date   CREATININE 2.54 (H) 10/13/2022   CREATININE 2.6 (A) 08/23/2022   CREATININE 3.00 (H) 08/03/2022     Lipid management: Being treated with pravastatin 40 mg daily    Lab Results  Component Value Date   CHOL 120 12/23/2021   CHOL 135 07/28/2021   CHOL 104 08/27/2019   Lab Results  Component Value Date   HDL 55 12/23/2021   HDL 64 07/28/2021   HDL 44.90 08/27/2019   Lab Results  Component Value Date   LDLCALC 56 12/23/2021   LDLCALC 58 07/28/2021   LDLCALC 47 08/27/2019   Lab Results  Component Value Date   TRIG 46 12/23/2021   TRIG 62 07/28/2021   TRIG 59.0 08/27/2019   Lab Results  Component Value Date   CHOLHDL 2.2 12/23/2021   CHOLHDL 2.1 07/28/2021  CHOLHDL 2 08/27/2019   No results found for: "LDLDIRECT"   Fibrosis 4 Score = 2.5       Fib-4 interpretation is not validated for people under 5 or over 61 years of age.      Examination:   BP (!) 144/82 (BP Location: Left Arm, Patient  Position: Sitting, Cuff Size: Normal)   Pulse 78   Ht 5\' 7"  (1.702 m)   Wt 174 lb 3.2 oz (79 kg)   SpO2 100%   BMI 27.28 kg/m   Body mass index is 27.28 kg/m.    ASSESSMENT/ PLAN:    Diabetes type 2:   Current regimen: Low-dose Humalog twice daily with meals, Farxiga 5 mg daily  See history of present illness for detailed discussion of current diabetes management, blood sugar patterns and problems identified  A1c is again falsely low below 6% Blood sugars are difficult to assess because of lack of glucose monitoring but appear to be generally better with starting [place] last time However not clear how much benefit she is getting from [place] with her creatinine clearance only about 17  Recommendations:  She will need to start checking her blood sugars consistently and more after meals especially dinner Her daughter will help with this Although she is interested in the freestyle [place] or other sensor her daughter does not want to work with a DME supplier to do this She will also make sure she has unexpired test trips Plan of management discussed with her and her daughter who was present  There are no Patient Instructions on file for this visit.  [name] 10/17/2022, 9:04 AM

## 2022-10-17 NOTE — Patient Instructions (Signed)
Check blood sugars on waking up 2-3 days a week  Also check blood sugars about 2 hours after meals and do this after different meals by rotation  Recommended blood sugar levels on waking up are 90-130 and about 2 hours after meal is 130-160  Please bring your blood sugar monitor to each visit, thank you   

## 2022-10-25 ENCOUNTER — Other Ambulatory Visit: Payer: Self-pay | Admitting: Internal Medicine

## 2022-10-29 ENCOUNTER — Ambulatory Visit
Admission: EM | Admit: 2022-10-29 | Discharge: 2022-10-29 | Disposition: A | Discharge status: Home / Self Care | Payer: 59 | Attending: Internal Medicine | Admitting: Internal Medicine

## 2022-10-29 ENCOUNTER — Ambulatory Visit (INDEPENDENT_AMBULATORY_CARE_PROVIDER_SITE_OTHER): Payer: 59

## 2022-10-29 DIAGNOSIS — K589 Irritable bowel syndrome without diarrhea: Secondary | ICD-10-CM | POA: Diagnosis not present

## 2022-10-29 DIAGNOSIS — Z79899 Other long term (current) drug therapy: Secondary | ICD-10-CM | POA: Diagnosis not present

## 2022-10-29 DIAGNOSIS — Z1152 Encounter for screening for COVID-19: Secondary | ICD-10-CM | POA: Insufficient documentation

## 2022-10-29 DIAGNOSIS — I130 Hypertensive heart and chronic kidney disease with heart failure and stage 1 through stage 4 chronic kidney disease, or unspecified chronic kidney disease: Secondary | ICD-10-CM | POA: Diagnosis not present

## 2022-10-29 DIAGNOSIS — M7989 Other specified soft tissue disorders: Secondary | ICD-10-CM | POA: Diagnosis not present

## 2022-10-29 DIAGNOSIS — S92515A Nondisplaced fracture of proximal phalanx of left lesser toe(s), initial encounter for closed fracture: Secondary | ICD-10-CM | POA: Diagnosis not present

## 2022-10-29 DIAGNOSIS — J069 Acute upper respiratory infection, unspecified: Secondary | ICD-10-CM

## 2022-10-29 DIAGNOSIS — N184 Chronic kidney disease, stage 4 (severe): Secondary | ICD-10-CM | POA: Diagnosis not present

## 2022-10-29 DIAGNOSIS — Z7951 Long term (current) use of inhaled steroids: Secondary | ICD-10-CM | POA: Insufficient documentation

## 2022-10-29 DIAGNOSIS — Z794 Long term (current) use of insulin: Secondary | ICD-10-CM | POA: Diagnosis not present

## 2022-10-29 DIAGNOSIS — S92502A Displaced unspecified fracture of left lesser toe(s), initial encounter for closed fracture: Secondary | ICD-10-CM

## 2022-10-29 DIAGNOSIS — R058 Other specified cough: Secondary | ICD-10-CM | POA: Insufficient documentation

## 2022-10-29 DIAGNOSIS — E1122 Type 2 diabetes mellitus with diabetic chronic kidney disease: Secondary | ICD-10-CM | POA: Diagnosis not present

## 2022-10-29 DIAGNOSIS — J45909 Unspecified asthma, uncomplicated: Secondary | ICD-10-CM | POA: Insufficient documentation

## 2022-10-29 DIAGNOSIS — D509 Iron deficiency anemia, unspecified: Secondary | ICD-10-CM | POA: Insufficient documentation

## 2022-10-29 DIAGNOSIS — E785 Hyperlipidemia, unspecified: Secondary | ICD-10-CM | POA: Insufficient documentation

## 2022-10-29 DIAGNOSIS — M79672 Pain in left foot: Secondary | ICD-10-CM

## 2022-10-29 DIAGNOSIS — E1110 Type 2 diabetes mellitus with ketoacidosis without coma: Secondary | ICD-10-CM | POA: Insufficient documentation

## 2022-10-29 DIAGNOSIS — I5042 Chronic combined systolic (congestive) and diastolic (congestive) heart failure: Secondary | ICD-10-CM | POA: Diagnosis not present

## 2022-10-29 DIAGNOSIS — Z7984 Long term (current) use of oral hypoglycemic drugs: Secondary | ICD-10-CM | POA: Insufficient documentation

## 2022-10-29 DIAGNOSIS — Z7902 Long term (current) use of antithrombotics/antiplatelets: Secondary | ICD-10-CM | POA: Diagnosis not present

## 2022-10-29 DIAGNOSIS — Z7901 Long term (current) use of anticoagulants: Secondary | ICD-10-CM | POA: Diagnosis not present

## 2022-10-29 DIAGNOSIS — Z950 Presence of cardiac pacemaker: Secondary | ICD-10-CM | POA: Diagnosis not present

## 2022-10-29 MED ORDER — BENZONATATE 100 MG PO CAPS: 100.0000 mg | ORAL_CAPSULE | Freq: Three times a day (TID) | ORAL | 0 refills | Status: DC | PRN

## 2022-10-29 NOTE — ED Triage Notes (Signed)
Pt reports cramping to her left keg x 3 days. States it is sore and is painful to walk.

## 2022-10-29 NOTE — ED Provider Notes (Signed)
[facility]    CSN: [id number] Arrival date & time: 10/29/22  1445      History   Chief Complaint Chief Complaint  Patient presents with   Foot Pain   Foot Swelling   Cough   Generalized Body Aches    HPI [name] is a 87 y.o. female.   Patient presents with 2 different chief complaints today.  Patient reports that she has had some left lateral foot pain that has been present for about 3 days.  She denies any injury to the area.  Denies any history of chronic foot pain.  Denies numbness or tingling.  Denies any associated fever.  Has taken Tylenol for pain.  Patient also reporting nasal congestion, cough, mild "stomach discomfort" that started yesterday.  Denies any known sick contacts or fever at home.  Denies chest pain, shortness of breath, sore throat, ear pain, nausea, vomiting, diarrhea.  Denies history of COPD but does report asthma.  Has not had to use albuterol inhaler but does take Symbicort as maintenance.   Foot Pain  Cough   Past Medical History:  Diagnosis Date   Allergic rhinitis    Anterior chest wall pain    Anxiety    Asthma    Chronic diastolic CHF (congestive heart failure) ([place])    Echo 01/2020: EF 60-65, normal wall motion, mild LVH, normal RV SF, RVSP 52.6 (moderate elevation), severe LAE, moderate RAE, trivial MR, mild MS (mean gradient 5.5 mmHg), mild aortic valve sclerosis (no AS); elevated E/e' c/w elevated LVEDP   Cough    DM type 2 (diabetes mellitus, type 2) (HCC)    GERD (gastroesophageal reflux disease)    History of diverticulitis of colon    HTN (hypertension)    Hyperlipidemia    IBS (irritable bowel syndrome)    Iron deficiency anemia    Mitral stenosis 04/17/2022   Echo 04/2022: EF 55-60, no RWMA, mild LVH, normal RVSF, normal PASP, moderate LAE, moderate mitral stenosis (mean gradient 4 mmHg), trivial MR, trivial AI, AV sclerosis without stenosis   Osteoporosis, unspecified    Renal insufficiency 07/31/2017   UTI  (urinary tract infection)     Patient Active Problem List   Diagnosis Date Noted   CKD (chronic kidney disease) stage 4, GFR 15-29 ml/min ([place]) 06/27/2022   Mitral stenosis 04/17/2022   Acute kidney injury superimposed on chronic kidney disease ([place]) 01/02/2022   NSTEMI (non-ST elevated myocardial infarction) ([place]) 12/23/2021   Osteoarthritis of knee 03/30/2021   Bilateral hand pain 03/08/2021   Complete heart block ([place]) 12/13/2020   Pacemaker - MDT 12/13/2020   SOB (shortness of breath) 07/29/2020   Encntr for surgical aftcr following surgery on the circ sys 07/26/2020   Pain in right knee 03/17/2020   Chronic combined systolic (congestive) and diastolic (congestive) heart failure ([place])    DKA (diabetic ketoacidoses) 12/20/2019   Diverticulosis 12/10/2019   Age-related osteoporosis without current pathological fracture 10/03/2019   Chronic kidney disease, stage 3 unspecified ([place]) 10/03/2019   Long term (current) use of insulin ([place]) 10/03/2019   Hyperlipidemia, unspecified 10/03/2019   Shortness of breath 01/30/2018   Acute bronchiolitis 12/17/2017   Wheezing 10/29/2017   Vitamin D deficiency 07/31/2017   Renal insufficiency 07/31/2017   Knee pain 06/01/2017   Morbid obesity due to excess calories ([place]) 01/25/2017   Diabetes ([place]) 04/12/2016   Cough 01/20/2015   Atrial fibrillation ([place]) 05/28/2014   Personal history of colonic polyps 05/28/2014   Vomiting 01/29/2014  CAD (coronary artery disease) 09/01/2013   Routine general medical examination at a health care facility 07/17/2011   Encounter for long-term (current) use of other medications 07/06/2011   Nonspecific (abnormal) findings on radiological and other examination of body structure 11/09/2009   IRRITABLE BOWEL SYNDROME 05/07/2009   CHEST PAIN 05/07/2009   ADNEXAL MASS, RIGHT 07/22/2008   HEMORRHOIDS, RECURRENT 01/27/2008   Diverticulitis 01/27/2008   OTH ABNORMAL FIND RAD EXAMINATION BREAST 01/27/2008   GERD  01/13/2008   UTI 09/28/2007   Dyslipidemia 05/27/2007   ANEMIA-IRON DEFICIENCY 05/27/2007   ANXIETY 05/27/2007   Essential hypertension 05/27/2007   Seasonal allergic rhinitis 05/27/2007   Cough variant asthma 05/27/2007   Osteoporosis 05/27/2007   DIVERTICULITIS, HX OF 05/27/2007    Past Surgical History:  Procedure Laterality Date   CARDIOVASCULAR STRESS TEST  02/25/04   CORONARY BALLOON ANGIOPLASTY N/A 12/28/2021   Procedure: CORONARY BALLOON ANGIOPLASTY;  Surgeon: Burnell Blanks, MD;  Location: [facility];  Service: Cardiovascular;  Laterality: N/A;   CORONARY STENT INTERVENTION N/A 12/28/2021   Procedure: CORONARY STENT INTERVENTION;  Surgeon: Burnell Blanks, MD;  Location: [facility];  Service: Cardiovascular;  Laterality: N/A;   ESOPHAGOGASTRODUODENOSCOPY  12/26/01   PACEMAKER IMPLANT N/A 08/02/2020   Procedure: PACEMAKER IMPLANT;  Surgeon: Deboraha Sprang, MD;  Location: [facility];  Service: Cardiovascular;  Laterality: N/A;   RIGHT OOPHORECTOMY  jan 2010   RIGHT/LEFT HEART CATH AND CORONARY ANGIOGRAPHY N/A 12/28/2021   Procedure: RIGHT/LEFT HEART CATH AND CORONARY ANGIOGRAPHY;  Surgeon: Burnell Blanks, MD;  Location: [facility];  Service: Cardiovascular;  Laterality: N/A;    OB History   No obstetric history on file.      Home Medications    Prior to Admission medications   Medication Sig Start Date End Date Taking? Authorizing Provider  benzonatate (TESSALON) 100 MG capsule Take 1 capsule (100 mg total) by mouth every 8 (eight) hours as needed for cough. 10/29/22  Yes Saryiah Bencosme, Hildred Alamin E, FNP  acetaminophen (TYLENOL) 325 MG tablet Take 650 mg by mouth every 6 (six) hours as needed for pain.    [provider]  albuterol (PROAIR HFA) 108 (90 Base) MCG/ACT inhaler 2 puffs every 4 hours as needed only  if your can't catch your breath 01/25/22   Tanda Rockers, MD  albuterol (PROVENTIL) (2.5 MG/3ML) 0.083%  nebulizer solution Take 3 mLs (2.5 mg total) by nebulization every 6 (six) hours as needed for wheezing or shortness of breath. 01/24/22   [name], FNP  apixaban (ELIQUIS) 2.5 MG TABS tablet TAKE 1 TABLET BY MOUTH TWICE A DAY 07/11/22   Fay Records, MD  Ascorbic Acid (VITAMIN C) 1000 MG tablet Take 1,000 mg by mouth daily.    [provider]  atorvastatin (LIPITOR) 80 MG tablet Take 1 tablet (80 mg total) by mouth daily. 04/03/22   Imogene Burn, PA-C  BD VEO INSULIN SYRINGE U/F 31G X 15/64" 0.5 ML MISC Use as instructed 04/25/22   Elayne Snare, MD  Blood Glucose Monitoring Suppl ([place]) w/Device KIT USE AS DIRECTED 01/16/22   Renato Shin, MD  budesonide-formoterol (SYMBICORT) 160-4.5 MCG/ACT inhaler TAKE 2 PUFFS FIRST THING IN AM AND THEN ANOTHER 2 PUFFS ABOUT 12 HOURS LATER. 10/06/22   Tanda Rockers, MD  carvedilol (COREG) 3.125 MG tablet Take 1 tablet (3.125 mg total) by mouth 2 (two) times daily with a meal. 04/03/22   Bonnell Public,  Jennye Moccasin, PA-C  cetirizine (ZYRTEC ALLERGY) 10 MG tablet Take 1 tablet (10 mg total) by mouth daily as needed for allergies. 05/04/20   Tanda Rockers, MD  cholecalciferol (VITAMIN D3) 25 MCG (1000 UNIT) tablet Take 1,000 Units by mouth daily. Isn't taking regularly    [provider]  clopidogrel (PLAVIX) 75 MG tablet Take 1 tablet (75 mg total) by mouth daily with breakfast. 04/03/22   Imogene Burn, PA-C  diclofenac Sodium (VOLTAREN) 1 % GEL Apply 4 g topically 4 (four) times daily as needed (pain). 01/27/21   [provider]  famotidine (PEPCID) 20 MG tablet TAKE 1 TABLET BY MOUTH TWICE A DAY 07/11/22   [name], FNP  FARXIGA 5 MG TABS tablet TAKE 1 TABLET BY MOUTH DAILY BEFORE BREAKFAST. 09/22/22   Elayne Snare, MD  furosemide (LASIX) 40 MG tablet Take 1 tablet (40 mg total) by mouth 2 (two) times daily. 10/25/22   Fay Records, MD  glucose blood [facility] VERIO) test strip 1 each by Other route 2  (two) times daily. And lancets 2/day 12/13/21   Renato Shin, MD  glucose blood test strip Check blood sugar twice a day 04/11/21   Renato Shin, MD  hydrALAZINE (APRESOLINE) 25 MG tablet Take 3 tablets (75 mg total) by mouth 3 (three) times daily. 08/14/22   Fay Records, MD  insulin lispro (HUMALOG) 100 UNIT/ML injection Give 3 units with BREAKFAST, AND 5 units with SUPPER 01/04/22   Reino Bellis B, NP  latanoprost (XALATAN) 0.005 % ophthalmic solution Place 1 drop into both eyes at bedtime. 03/16/21   [provider]  montelukast (SINGULAIR) 10 MG tablet TAKE 1 TABLET BY MOUTH EVERYDAY AT BEDTIME 12/22/21   Tanda Rockers, MD  nitroGLYCERIN (NITROSTAT) 0.4 MG SL tablet Place 1 tablet (0.4 mg total) under the tongue every 5 (five) minutes x 3 doses as needed for chest pain. 01/04/22   Cheryln Manly, NP  ondansetron (ZOFRAN) 8 MG tablet Take 1 tablet (8 mg total) by mouth every 8 (eight) hours as needed for nausea or vomiting. 10/04/22   Fay Records, MD  pantoprazole (PROTONIX) 40 MG tablet Take 1 tablet (40 mg total) by mouth 2 (two) times daily. 04/03/22   Imogene Burn, PA-C  potassium chloride (KLOR-CON) 10 MEQ tablet Take 1 tablet (10 mEq total) by mouth daily. 07/06/22   Deboraha Sprang, MD  sodium bicarbonate 650 MG tablet Take 650 mg by mouth 2 (two) times daily. 05/19/22   [provider]  Syringe/Needle, Disp, (SYRINGE 3CC/25GX1") 25G X 1" 3 ML MISC Use to inject into the skin 2x a day 04/11/21   Renato Shin, MD  traMADol (ULTRAM) 50 MG tablet Take 50 mg by mouth every 6 (six) hours as needed for moderate pain. 10/12/21   [provider]  vitamin B-12 (CYANOCOBALAMIN) 100 MCG tablet Take 100 mcg by mouth daily.    [provider]    Family History Family History  Problem Relation Age of Onset   Lung cancer Brother    Melanoma Brother    Hypertension Mother    Stroke Mother    Other Father        poor circulation   Cancer Sister    Diabetes  Daughter    Atopy Neg Hx    Asthma Neg Hx    Breast cancer Neg Hx     Social History Social History   Tobacco Use   Smoking status:  Former    Packs/day: 0.30    Years: 5.00    Total pack years: 1.50    Types: Cigarettes    Quit date: 10/02/1986    Years since quitting: 36.0   Smokeless tobacco: Never  Vaping Use   Vaping Use: Never used  Substance Use Topics   Alcohol use: No   Drug use: No     Allergies   Aspirin and Ramipril   Review of Systems Review of Systems Per HPI  Physical Exam Triage Vital Signs ED Triage Vitals  Enc Vitals Group     BP 10/29/22 1500 133/83     Pulse Rate 10/29/22 1500 78     Resp 10/29/22 1500 17     Temp 10/29/22 1500 98.3 F (36.8 C)     Temp Source 10/29/22 1500 Oral     SpO2 10/29/22 1500 97 %     Weight --      Height --      Head Circumference --      Peak Flow --      Pain Score 10/29/22 1459 7     Pain Loc --      Pain Edu? --      Excl. in [place]? --    No data found.  Updated Vital Signs BP 133/83 (BP Location: Left Arm)   Pulse 78   Temp 98.3 F (36.8 C) (Oral)   Resp 17   SpO2 97%   Visual Acuity Right Eye Distance:   Left Eye Distance:   Bilateral Distance:    Right Eye Near:   Left Eye Near:    Bilateral Near:     Physical Exam Constitutional:      General: She is not in acute distress.    Appearance: Normal appearance. She is not toxic-appearing or diaphoretic.  HENT:     Head: Normocephalic and atraumatic.     Right Ear: Tympanic membrane and ear canal normal.     Left Ear: Tympanic membrane and ear canal normal.     Nose: Congestion present.     Mouth/Throat:     Mouth: Mucous membranes are moist.     Pharynx: No posterior oropharyngeal erythema.  Eyes:     Extraocular Movements: Extraocular movements intact.     Conjunctiva/sclera: Conjunctivae normal.     Pupils: Pupils are equal, round, and reactive to light.  Cardiovascular:     Rate and Rhythm: Normal rate and regular rhythm.      Pulses: Normal pulses.     Heart sounds: Normal heart sounds.  Pulmonary:     Effort: Pulmonary effort is normal. No respiratory distress.     Breath sounds: Normal breath sounds. No stridor. No wheezing, rhonchi or rales.  Abdominal:     General: Abdomen is flat. Bowel sounds are normal.     Palpations: Abdomen is soft.  Musculoskeletal:        General: Normal range of motion.     Cervical back: Normal range of motion.       Feet:  Feet:     Comments: Patient has tenderness to palpation with associated mild erythema and mild swelling present to lateral portion of left foot overlying fourth and fifth metatarsals.  No lacerations or abrasions noted.  No tenderness to toes.  Patient can wiggle toes.  No tenderness to ankle or lower leg.  Capillary refill and pulses intact. Skin:    General: Skin is warm and dry.  Neurological:     General:  No focal deficit present.     Mental Status: She is alert and oriented to person, place, and time. Mental status is at baseline.  Psychiatric:        Mood and Affect: Mood normal.        Behavior: Behavior normal.      UC Treatments / Results  Labs (all labs ordered are listed, but only abnormal results are displayed) Labs Reviewed  SARS CORONAVIRUS 2 (TAT 6-24 HRS)    EKG   Radiology DG Foot Complete Left  Result Date: 10/29/2022 CLINICAL DATA:  Foot pain EXAM: LEFT FOOT - COMPLETE 3+ VIEW COMPARISON:  None Available. FINDINGS: Vascular calcifications. Nondisplaced fracture through the distal aspect of the proximal fifth phalanx. No other acute bony or soft tissue abnormalities are identified. IMPRESSION: 1. Nondisplaced fracture through the distal aspect of the proximal fifth phalanx. 2. Vascular calcifications. Electronically Signed   By: Dorise Bullion III M.D.   On: 10/29/2022 16:07    Procedures Procedures (including critical care time)  Medications Ordered in UC Medications - No data to display  Initial Impression / Assessment  and Plan / UC Course  I have reviewed the triage vital signs and the nursing notes.  Pertinent labs & imaging results that were available during my care of the patient were reviewed by me and considered in my medical decision making (see chart for details).     Patient presents with symptoms likely from a viral upper respiratory infection. Do not suspect underlying cardiopulmonary process. Symptoms seem unlikely related to ACS, CHF or COPD exacerbations, pneumonia, pneumothorax. Patient is nontoxic appearing and not in need of emergent medical intervention. Recommended symptom control with medications and supportive care that are safe for patient.  Patient sent cough medication.  X-ray of left foot is showing a nondisplaced fracture of the left fifth toe.  Patient does not have any tenderness to palpation on exam which is interesting.  Other pain is not located near the toe either.  Although, will buddy tape and apply postop shoe to help support.  Differential diagnoses for other pain include gout versus contusion.  Low suspicion for gout given physical exam.  There is no concern for bacterial infection or septic arthritis at this time.  Patient has history of chronic kidney disease and glaucoma so she is not able to take any type of anti-inflammatory medications.  Patient has also been recently prescribed tramadol so unable to prescribe any additional narcotics.  The safest and most reasonable medication for patient to take at this time is Tylenol.  Patient was advised to follow-up with podiatry at provided contact information further evaluation and management given limited management available here in urgent care.  Provided patient with contact information to call tomorrow to schedule an appointment.  Advised strict return ER precautions for all chief complaints today.  Patient and family member verbalized understanding and were agreeable with plan.  Return if symptoms fail to improve in 1-2 weeks or  you develop shortness of breath, chest pain, severe headache. Patient states understanding and is agreeable. Final Clinical Impressions(s) / UC Diagnoses   Final diagnoses:  Viral upper respiratory tract infection with cough  Left foot pain  Closed fracture of phalanx of left fifth toe, initial encounter     Discharge Instructions      You have a viral upper respiratory infection which should run its course and self resolve with symptomatic treatment as we discussed.  I have prescribed a cough medication.  COVID test  is pending.  Will call if it is positive.  On x-ray. It appears that you have broken your pinky toe.  No other abnormalities.  Shoe and buddy tape have been applied.  Follow-up with podiatry at provided contact information tomorrow to schedule appointment for further evaluation.  Apply ice which may be helpful as well.  Elevate extremity.    ED Prescriptions     Medication Sig Dispense Auth. Provider   benzonatate (TESSALON) 100 MG capsule Take 1 capsule (100 mg total) by mouth every 8 (eight) hours as needed for cough. 21 capsule [place], Michele Rockers, [place]      PDMP not reviewed this encounter.   Teodora Medici, [place] 10/29/22 [phone]

## 2022-10-29 NOTE — Discharge Instructions (Signed)
You have a viral upper respiratory infection which should run its course and self resolve with symptomatic treatment as we discussed.  I have prescribed a cough medication.  COVID test is pending.  Will call if it is positive.  On x-ray. It appears that you have broken your pinky toe.  No other abnormalities.  Shoe and buddy tape have been applied.  Follow-up with podiatry at provided contact information tomorrow to schedule appointment for further evaluation.  Apply ice which may be helpful as well.  Elevate extremity.

## 2022-10-29 NOTE — ED Notes (Signed)
Pt states she feels hot

## 2022-10-30 ENCOUNTER — Ambulatory Visit: Payer: 59

## 2022-10-30 ENCOUNTER — Ambulatory Visit (INDEPENDENT_AMBULATORY_CARE_PROVIDER_SITE_OTHER): Payer: 59 | Admitting: Podiatry

## 2022-10-30 ENCOUNTER — Encounter: Payer: Self-pay | Admitting: Podiatry

## 2022-10-30 VITALS — BP 144/82

## 2022-10-30 DIAGNOSIS — E1151 Type 2 diabetes mellitus with diabetic peripheral angiopathy without gangrene: Secondary | ICD-10-CM

## 2022-10-30 DIAGNOSIS — S92912A Unspecified fracture of left toe(s), initial encounter for closed fracture: Secondary | ICD-10-CM

## 2022-10-30 DIAGNOSIS — I442 Atrioventricular block, complete: Secondary | ICD-10-CM

## 2022-10-30 LAB — SARS CORONAVIRUS 2 (TAT 6-24 HRS): SARS Coronavirus 2: NEGATIVE

## 2022-10-30 MED ORDER — TRAMADOL HCL 50 MG PO TABS: 50.0000 mg | ORAL_TABLET | Freq: Four times a day (QID) | ORAL | 0 refills | Status: AC | PRN

## 2022-10-30 NOTE — Progress Notes (Signed)
  Subjective:  Patient ID: [name], female    DOB: May 18, 1936,   MRN: [id number]  Chief Complaint  Patient presents with   Fracture    Left foot 5th toe fracture, seen at urgent care yesterday,     87 y.o. female presents for concern of broken left pinky toe. Relates several days ago she started having pain in that area but does not recall any injury. She was told to tape and wear surgical shoe. She does have a history of diabetes. Last A1c was 5.83  . Denies any other pedal complaints. Denies n/v/f/c.   Past Medical History:  Diagnosis Date   Allergic rhinitis    Anterior chest wall pain    Anxiety    Asthma    Chronic diastolic CHF (congestive heart failure) ([place])    Echo 01/2020: EF 60-65, normal wall motion, mild LVH, normal RV SF, RVSP 52.6 (moderate elevation), severe LAE, moderate RAE, trivial MR, mild MS (mean gradient 5.5 mmHg), mild aortic valve sclerosis (no AS); elevated E/e' c/w elevated LVEDP   Cough    DM type 2 (diabetes mellitus, type 2) (HCC)    GERD (gastroesophageal reflux disease)    History of diverticulitis of colon    HTN (hypertension)    Hyperlipidemia    IBS (irritable bowel syndrome)    Iron deficiency anemia    Mitral stenosis 04/17/2022   Echo 04/2022: EF 55-60, no RWMA, mild LVH, normal RVSF, normal PASP, moderate LAE, moderate mitral stenosis (mean gradient 4 mmHg), trivial MR, trivial AI, AV sclerosis without stenosis   Osteoporosis, unspecified    Renal insufficiency 07/31/2017   UTI (urinary tract infection)     Objective:  Physical Exam: Vascular: DP/PT pulses 2/4 bilateral. CFT <3 seconds. Normal hair growth on digits. No edema.  Skin. No lacerations or abrasions bilateral feet.  Musculoskeletal: MMT 5/5 bilateral lower extremities in DF, PF, Inversion and Eversion. Deceased ROM in DF of ankle joint. Tenderness and edema noted to left fifth digit.  Neurological: Sensation intact to light touch.   Assessment:   1. Type II diabetes mellitus  with peripheral circulatory disorder (HCC)   2. Closed fracture dislocation of interphalangeal joint of single toe of left foot, initial encounter      Plan:  Patient was evaluated and treated and all questions answered. -Xrays reviewed. Acute fracture nondisplaced through head of proximal phalanx of left fifth digit. Non articular Reivewed notes from urgent care.  -Discussed treatement options for toe fracture; risks, alternatives, and benefits explained. -Discussed taping of toe.  -Dispensed surgical shoe. Patient to wear at all times and instructed on use -Recommend protection, rest, ice, elevation daily until symptoms improve -Rx pain med/antinflammatories as needed -Tramadol sent 5 days for pain relief  -Patient to return to office in 4 weeks for serial x-rays to assess healing  or sooner if condition worsens.   Lorenda Peck, DPM

## 2022-10-30 NOTE — Addendum Note (Signed)
Addended by: Lorenda Peck R on: 10/30/2022 11:22 AM   Modules accepted: Level of Service

## 2022-10-31 LAB — CUP PACEART REMOTE DEVICE CHECK
Battery Remaining Longevity: 135 mo
Battery Voltage: 3.02 V
Brady Statistic RV Percent Paced: 99.61 %
Date Time Interrogation Session: 20240129044029
Implantable Lead Connection Status: 753985
Implantable Lead Implant Date: 20211101
Implantable Lead Location: 753860
Implantable Lead Model: 3830
Implantable Pulse Generator Implant Date: 20211101
Lead Channel Impedance Value: 342 Ohm
Lead Channel Impedance Value: 437 Ohm
Lead Channel Pacing Threshold Amplitude: 0.875 V
Lead Channel Pacing Threshold Pulse Width: 0.4 ms
Lead Channel Sensing Intrinsic Amplitude: 9.75 mV
Lead Channel Sensing Intrinsic Amplitude: 9.75 mV
Lead Channel Setting Pacing Amplitude: 2 V
Lead Channel Setting Pacing Pulse Width: 0.4 ms
Lead Channel Setting Sensing Sensitivity: 0.9 mV
Zone Setting Status: 755011

## 2022-11-13 DIAGNOSIS — N184 Chronic kidney disease, stage 4 (severe): Secondary | ICD-10-CM | POA: Diagnosis not present

## 2022-11-13 DIAGNOSIS — N390 Urinary tract infection, site not specified: Secondary | ICD-10-CM | POA: Diagnosis not present

## 2022-11-17 ENCOUNTER — Ambulatory Visit
Admission: RE | Admit: 2022-11-17 | Discharge: 2022-11-17 | Disposition: A | Discharge status: Home / Self Care | Payer: 59 | Source: Ambulatory Visit | Attending: Family | Admitting: Family

## 2022-11-17 DIAGNOSIS — Z1231 Encounter for screening mammogram for malignant neoplasm of breast: Secondary | ICD-10-CM | POA: Diagnosis not present

## 2022-11-21 DIAGNOSIS — I4891 Unspecified atrial fibrillation: Secondary | ICD-10-CM | POA: Diagnosis not present

## 2022-11-21 DIAGNOSIS — N179 Acute kidney failure, unspecified: Secondary | ICD-10-CM | POA: Diagnosis not present

## 2022-11-21 DIAGNOSIS — N184 Chronic kidney disease, stage 4 (severe): Secondary | ICD-10-CM | POA: Diagnosis not present

## 2022-11-21 DIAGNOSIS — E785 Hyperlipidemia, unspecified: Secondary | ICD-10-CM | POA: Diagnosis not present

## 2022-11-21 DIAGNOSIS — E1122 Type 2 diabetes mellitus with diabetic chronic kidney disease: Secondary | ICD-10-CM | POA: Diagnosis not present

## 2022-11-21 DIAGNOSIS — D509 Iron deficiency anemia, unspecified: Secondary | ICD-10-CM | POA: Diagnosis not present

## 2022-11-21 DIAGNOSIS — I129 Hypertensive chronic kidney disease with stage 1 through stage 4 chronic kidney disease, or unspecified chronic kidney disease: Secondary | ICD-10-CM | POA: Diagnosis not present

## 2022-11-21 DIAGNOSIS — I5021 Acute systolic (congestive) heart failure: Secondary | ICD-10-CM | POA: Diagnosis not present

## 2022-11-27 ENCOUNTER — Ambulatory Visit (INDEPENDENT_AMBULATORY_CARE_PROVIDER_SITE_OTHER): Payer: 59

## 2022-11-27 ENCOUNTER — Encounter: Payer: Self-pay | Admitting: Podiatry

## 2022-11-27 ENCOUNTER — Ambulatory Visit (INDEPENDENT_AMBULATORY_CARE_PROVIDER_SITE_OTHER): Payer: 59 | Admitting: Podiatry

## 2022-11-27 DIAGNOSIS — M79672 Pain in left foot: Secondary | ICD-10-CM | POA: Diagnosis not present

## 2022-11-27 DIAGNOSIS — S92912D Unspecified fracture of left toe(s), subsequent encounter for fracture with routine healing: Secondary | ICD-10-CM | POA: Diagnosis not present

## 2022-11-27 NOTE — Progress Notes (Signed)
  Subjective:  Patient ID: [name], female    DOB: 1936/03/28,   MRN: [id number]  Chief Complaint  Patient presents with   Ankle Pain    Patient states toes are not bother ing her its her ankle pain but it is much better since the last visit     87 y.o. female presents for follow-up of broken left pinky toe. Relates the toe is feeling better but still getting pain in her ankle and swelling around the area. . She does have a history of diabetes. Last A1c was 5.83  . Denies any other pedal complaints. Denies n/v/f/c.   Past Medical History:  Diagnosis Date   Allergic rhinitis    Anterior chest wall pain    Anxiety    Asthma    Chronic diastolic CHF (congestive heart failure) ([place])    Echo 01/2020: EF 60-65, normal wall motion, mild LVH, normal RV SF, RVSP 52.6 (moderate elevation), severe LAE, moderate RAE, trivial MR, mild MS (mean gradient 5.5 mmHg), mild aortic valve sclerosis (no AS); elevated E/e' c/w elevated LVEDP   Cough    DM type 2 (diabetes mellitus, type 2) (HCC)    GERD (gastroesophageal reflux disease)    History of diverticulitis of colon    HTN (hypertension)    Hyperlipidemia    IBS (irritable bowel syndrome)    Iron deficiency anemia    Mitral stenosis 04/17/2022   Echo 04/2022: EF 55-60, no RWMA, mild LVH, normal RVSF, normal PASP, moderate LAE, moderate mitral stenosis (mean gradient 4 mmHg), trivial MR, trivial AI, AV sclerosis without stenosis   Osteoporosis, unspecified    Renal insufficiency 07/31/2017   UTI (urinary tract infection)     Objective:  Physical Exam: Vascular: DP/PT pulses 2/4 bilateral. CFT <3 seconds. Normal hair growth on digits. No edema.  Skin. No lacerations or abrasions bilateral feet.  Musculoskeletal: MMT 5/5 bilateral lower extremities in DF, PF, Inversion and Eversion. Deceased ROM in DF of ankle joint. Tenderness and edema noted to left fifth digit.  Neurological: Sensation intact to light touch.   Assessment:   1. Closed  fracture dislocation of interphalangeal joint of single toe of left foot with routine healing, subsequent encounter   2. Left foot pain       Plan:  Patient was evaluated and treated and all questions answered. -Xrays reviewed. Acute fracture nondisplaced through head of proximal phalanx of left fifth digit. Non articular appears to be healing.  -Discussed treatement options for toe fracture; risks, alternatives, and benefits explained. -WBAT in regular shoe.  -Recommend protection, rest, ice, elevation daily until symptoms improve -Rx pain med/antinflammatories as needed -Patient to return to office as needed   Lorenda Peck, DPM

## 2022-11-28 DIAGNOSIS — M1711 Unilateral primary osteoarthritis, right knee: Secondary | ICD-10-CM | POA: Diagnosis not present

## 2022-12-13 NOTE — Progress Notes (Signed)
Remote pacemaker transmission.   

## 2022-12-19 DIAGNOSIS — H401131 Primary open-angle glaucoma, bilateral, mild stage: Secondary | ICD-10-CM | POA: Diagnosis not present

## 2022-12-23 ENCOUNTER — Other Ambulatory Visit: Payer: Self-pay | Admitting: Family

## 2022-12-31 ENCOUNTER — Other Ambulatory Visit: Payer: Self-pay | Admitting: Internal Medicine

## 2023-01-01 ENCOUNTER — Ambulatory Visit (INDEPENDENT_AMBULATORY_CARE_PROVIDER_SITE_OTHER): Payer: 59 | Admitting: Family Medicine

## 2023-01-01 ENCOUNTER — Encounter: Payer: Self-pay | Admitting: Family Medicine

## 2023-01-01 VITALS — BP 112/68 | HR 80 | Temp 98.7°F | Ht 67.0 in | Wt 174.0 lb

## 2023-01-01 DIAGNOSIS — J4541 Moderate persistent asthma with (acute) exacerbation: Secondary | ICD-10-CM

## 2023-01-01 LAB — POC COVID19 BINAXNOW: SARS Coronavirus 2 Ag: NEGATIVE

## 2023-01-01 MED ORDER — PREDNISONE 20 MG PO TABS: 40.0000 mg | ORAL_TABLET | Freq: Every day | ORAL | 0 refills | Status: AC

## 2023-01-01 NOTE — Patient Instructions (Signed)
Continue to push fluids, practice good hand hygiene, and cover your mouth if you cough.  If you start having fevers, shaking or shortness of breath, seek immediate care.  OK to take Tylenol 1000 mg (2 extra strength tabs) or 975 mg (3 regular strength tabs) every 6 hours as needed.  OK to use Debrox (peroxide) in the ear to loosen up wax. Also recommend using a bulb syringe (for removing boogers from baby's noses) to flush through warm water and vinegar (3-4:1 ratio). An alternative, though more expensive, is an elephant ear washer wax removal kit. Do not use Q-tips as this can impact wax further.  Let us know if you need anything.

## 2023-01-01 NOTE — Progress Notes (Signed)
Chief Complaint  Patient presents with   Cough    Sneezing Shortness of breath Nausea Stated coughing on Saturday    [name] here for URI complaints. Here w daughter.   Duration: 2 days;  Associated symptoms: sinus pain, rhinorrhea, wheezing, nausea, subj fever, fatigue, shortness of breath, and coughing Denies: sinus congestion, itchy watery eyes, ear pain, ear drainage, sore throat, myalgia, and fevers, V/D, loss of taste/smell Treatment to date: [place], asthma tx, Mucinex Sick contacts: No  Past Medical History:  Diagnosis Date   Allergic rhinitis    Anterior chest wall pain    Anxiety    Asthma    Chronic diastolic CHF (congestive heart failure)    Echo 01/2020: EF 60-65, normal wall motion, mild LVH, normal RV SF, RVSP 52.6 (moderate elevation), severe LAE, moderate RAE, trivial MR, mild MS (mean gradient 5.5 mmHg), mild aortic valve sclerosis (no AS); elevated E/e' c/w elevated LVEDP   Cough    DM type 2 (diabetes mellitus, type 2)    GERD (gastroesophageal reflux disease)    History of diverticulitis of colon    HTN (hypertension)    Hyperlipidemia    IBS (irritable bowel syndrome)    Iron deficiency anemia    Mitral stenosis 04/17/2022   Echo 04/2022: EF 55-60, no RWMA, mild LVH, normal RVSF, normal PASP, moderate LAE, moderate mitral stenosis (mean gradient 4 mmHg), trivial MR, trivial AI, AV sclerosis without stenosis   Osteoporosis, unspecified    Renal insufficiency 07/31/2017   UTI (urinary tract infection)     Objective BP 112/68 (BP Location: Left Arm, Cuff Size: Normal)   Pulse 80   Temp 98.7 F (37.1 C) (Oral)   Ht 5\' 7"  (1.702 m)   Wt 174 lb (78.9 kg)   SpO2 97%   BMI 27.25 kg/m  General: Awake, alert, appears stated age HEENT: AT, [place], ears patent b/l and TM's neg, nares patent w/o discharge, pharynx pink and without exudates, MMM Neck: No masses or asymmetry Heart: RRR Lungs: diffuse exp wheezes, no accessory muscle use Psych: Age appropriate  judgment and insight, normal mood and affect  Moderate persistent asthma with exacerbation - Plan: predniSONE (DELTASONE) 20 MG tablet  7 d pred burst 40 mg/d. Historically needs more days per pt and daughter. Continue to push fluids, practice good hand hygiene, cover mouth when coughing. F/u prn. If starting to experience fevers, shaking, or worsening shortness of breath, seek immediate care. Pt and her daughter voiced understanding and agreement to the plan.  [place], DO 01/01/23 3:27 PM

## 2023-01-01 NOTE — Addendum Note (Signed)
Addended by: Sharon Seller B on: 01/01/2023 03:33 PM   Modules accepted: Orders

## 2023-01-03 ENCOUNTER — Ambulatory Visit: Payer: 59 | Admitting: Podiatry

## 2023-01-05 ENCOUNTER — Other Ambulatory Visit: Payer: Self-pay | Admitting: Physician Assistant

## 2023-01-05 ENCOUNTER — Other Ambulatory Visit: Payer: Self-pay | Admitting: Family

## 2023-01-05 ENCOUNTER — Telehealth: Payer: Self-pay | Admitting: Family

## 2023-01-05 MED ORDER — DOXYCYCLINE HYCLATE 100 MG PO TABS: 100.0000 mg | ORAL_TABLET | Freq: Two times a day (BID) | ORAL | 0 refills | Status: DC

## 2023-01-05 MED ORDER — BENZONATATE 100 MG PO CAPS: 100.0000 mg | ORAL_CAPSULE | Freq: Three times a day (TID) | ORAL | 0 refills | Status: DC | PRN

## 2023-01-05 NOTE — Telephone Encounter (Signed)
Spoke with pt, pt is aware Rx has been sent in.  

## 2023-01-05 NOTE — Telephone Encounter (Signed)
Pt called stating that she is still having issues from coughing from he virus she has and was wondering if Vernona Rieger could call something in for her.

## 2023-01-08 ENCOUNTER — Other Ambulatory Visit: Payer: Self-pay | Admitting: Internal Medicine

## 2023-01-08 DIAGNOSIS — I4891 Unspecified atrial fibrillation: Secondary | ICD-10-CM

## 2023-01-08 NOTE — Telephone Encounter (Signed)
Prescription refill request for Eliquis received. Indication: Afib  Last office visit: 10/04/22 Tenny Craw) Scr:  2.54 (10/13/22)  Age: 87 Weight: 78.9kg  Appropriate dose. Refill sent.

## 2023-01-11 ENCOUNTER — Telehealth: Payer: Self-pay | Admitting: Family

## 2023-01-11 NOTE — Telephone Encounter (Signed)
Spoke with pt, pt scheduled an appt for 01/12/2023.

## 2023-01-11 NOTE — Telephone Encounter (Signed)
[name] (daughter DPR NOT Ok) called stating that the pt was looking to get another round of prednisone to treat her cough.

## 2023-01-12 ENCOUNTER — Encounter: Payer: Self-pay | Admitting: Family

## 2023-01-12 ENCOUNTER — Ambulatory Visit (INDEPENDENT_AMBULATORY_CARE_PROVIDER_SITE_OTHER): Payer: 59 | Admitting: Family

## 2023-01-12 ENCOUNTER — Ambulatory Visit: Payer: 59 | Admitting: Family

## 2023-01-12 ENCOUNTER — Ambulatory Visit (HOSPITAL_BASED_OUTPATIENT_CLINIC_OR_DEPARTMENT_OTHER)
Admission: RE | Admit: 2023-01-12 | Discharge: 2023-01-12 | Disposition: A | Discharge status: Home / Self Care | Payer: 59 | Source: Ambulatory Visit | Attending: Family | Admitting: Family

## 2023-01-12 VITALS — BP 120/82 | HR 75 | Ht 67.0 in | Wt 170.2 lb

## 2023-01-12 DIAGNOSIS — R059 Cough, unspecified: Secondary | ICD-10-CM | POA: Diagnosis not present

## 2023-01-12 DIAGNOSIS — R053 Chronic cough: Secondary | ICD-10-CM | POA: Diagnosis not present

## 2023-01-12 MED ORDER — ONDANSETRON HCL 8 MG PO TABS: 8.0000 mg | ORAL_TABLET | Freq: Three times a day (TID) | ORAL | 0 refills | Status: DC | PRN

## 2023-01-12 MED ORDER — PREDNISONE 10 MG PO TABS: ORAL_TABLET | ORAL | 0 refills | Status: DC

## 2023-01-12 MED ORDER — ALBUTEROL SULFATE (2.5 MG/3ML) 0.083% IN NEBU
2.5000 mg | INHALATION_SOLUTION | Freq: Once | RESPIRATORY_TRACT | Status: AC
  Administered 2023-01-12: 2.5 mg via RESPIRATORY_TRACT

## 2023-01-12 MED ORDER — AZITHROMYCIN 250 MG PO TABS: ORAL_TABLET | ORAL | 0 refills | Status: DC

## 2023-01-12 NOTE — Addendum Note (Signed)
Addended by: Judieth Keens on: 01/12/2023 02:54 PM   Modules accepted: Orders

## 2023-01-12 NOTE — Progress Notes (Signed)
[name] is a 87 y.o. female with the following history as recorded in [employer]:  Patient Active Problem List   Diagnosis Date Noted   CKD (chronic kidney disease) stage 4, GFR 15-29 ml/min 06/27/2022   Mitral stenosis 04/17/2022   Acute kidney injury superimposed on chronic kidney disease 01/02/2022   NSTEMI (non-ST elevated myocardial infarction) 12/23/2021   Osteoarthritis of knee 03/30/2021   Bilateral hand pain 03/08/2021   Complete heart block 12/13/2020   Pacemaker - MDT 12/13/2020   SOB (shortness of breath) 07/29/2020   Encntr for surgical aftcr following surgery on the circ sys 07/26/2020   Pain in right knee 03/17/2020   Chronic combined systolic (congestive) and diastolic (congestive) heart failure    DKA (diabetic ketoacidoses) 12/20/2019   Diverticulosis 12/10/2019   Age-related osteoporosis without current pathological fracture 10/03/2019   Chronic kidney disease, stage 3 unspecified 10/03/2019   Long term (current) use of insulin 10/03/2019   Hyperlipidemia, unspecified 10/03/2019   Shortness of breath 01/30/2018   Acute bronchiolitis 12/17/2017   Wheezing 10/29/2017   Vitamin D deficiency 07/31/2017   Renal insufficiency 07/31/2017   Knee pain 06/01/2017   Morbid obesity due to excess calories 01/25/2017   Diabetes 04/12/2016   Cough 01/20/2015   Atrial fibrillation 05/28/2014   Personal history of colonic polyps 05/28/2014   Vomiting 01/29/2014   CAD (coronary artery disease) 09/01/2013   Routine general medical examination at a health care facility 07/17/2011   Encounter for long-term (current) use of other medications 07/06/2011   Nonspecific (abnormal) findings on radiological and other examination of body structure 11/09/2009   IRRITABLE BOWEL SYNDROME 05/07/2009   CHEST PAIN 05/07/2009   ADNEXAL MASS, RIGHT 07/22/2008   HEMORRHOIDS, RECURRENT 01/27/2008   Diverticulitis 01/27/2008   OTH ABNORMAL FIND RAD EXAMINATION BREAST 01/27/2008   GERD  01/13/2008   UTI 09/28/2007   Dyslipidemia 05/27/2007   ANEMIA-IRON DEFICIENCY 05/27/2007   ANXIETY 05/27/2007   Essential hypertension 05/27/2007   Seasonal allergic rhinitis 05/27/2007   Cough variant asthma 05/27/2007   Osteoporosis 05/27/2007   DIVERTICULITIS, HX OF 05/27/2007    Current Outpatient Medications  Medication Sig Dispense Refill   acetaminophen (TYLENOL) 325 MG tablet Take 650 mg by mouth every 6 (six) hours as needed for pain.     albuterol (PROAIR HFA) 108 (90 Base) MCG/ACT inhaler 2 puffs every 4 hours as needed only  if your can't catch your breath 18 g 11   albuterol (PROVENTIL) (2.5 MG/3ML) 0.083% nebulizer solution Take 3 mLs (2.5 mg total) by nebulization every 6 (six) hours as needed for wheezing or shortness of breath. 150 mL 1   Ascorbic Acid (VITAMIN C) 1000 MG tablet Take 1,000 mg by mouth daily.     atorvastatin (LIPITOR) 80 MG tablet Take 1 tablet (80 mg total) by mouth daily. 90 tablet 3   azithromycin (ZITHROMAX Z-PAK) 250 MG tablet Take 2 tablets po qd x 1 days; then take 1 tablet qd x days 2-5; 6 each 0   BD VEO INSULIN SYRINGE U/F 31G X 15/64" 0.5 ML MISC Use as instructed 100 each 3   Blood Glucose Monitoring Suppl (ONETOUCH VERIO FLEX SYSTEM) w/Device KIT USE AS DIRECTED 1 kit .   budesonide-formoterol (SYMBICORT) 160-4.5 MCG/ACT inhaler TAKE 2 PUFFS FIRST THING IN AM AND THEN ANOTHER 2 PUFFS ABOUT 12 HOURS LATER. 30.6 each 6   carvedilol (COREG) 3.125 MG tablet Take 1 tablet (3.125 mg total) by mouth 2 (two) times daily with a  meal. 180 tablet 3   cetirizine (ZYRTEC ALLERGY) 10 MG tablet Take 1 tablet (10 mg total) by mouth daily as needed for allergies.     clopidogrel (PLAVIX) 75 MG tablet TAKE 1 TABLET BY MOUTH DAILY WITH BREAKFAST. 90 tablet 2   diclofenac Sodium (VOLTAREN) 1 % GEL Apply 4 g topically 4 (four) times daily as needed (pain).     ELIQUIS 2.5 MG TABS tablet TAKE 1 TABLET BY MOUTH TWICE A DAY 60 tablet 5   famotidine (PEPCID) 20 MG  tablet Take 1 tablet (20 mg total) by mouth 2 (two) times daily. 180 tablet 1   FARXIGA 5 MG TABS tablet TAKE 1 TABLET BY MOUTH DAILY BEFORE BREAKFAST. 30 tablet 3   furosemide (LASIX) 40 MG tablet Take 1 tablet (40 mg total) by mouth 2 (two) times daily. 180 tablet 3   glucose blood (ONETOUCH VERIO) test strip 1 each by Other route 2 (two) times daily. And lancets 2/day 200 each 3   glucose blood test strip Check blood sugar twice a day 100 each 12   hydrALAZINE (APRESOLINE) 25 MG tablet Take 3 tablets (75 mg total) by mouth 3 (three) times daily. 270 tablet 8   insulin lispro (HUMALOG) 100 UNIT/ML injection Give 3 units with BREAKFAST, AND 5 units with SUPPER 60 mL 1   latanoprost (XALATAN) 0.005 % ophthalmic solution Place 1 drop into both eyes at bedtime.     montelukast (SINGULAIR) 10 MG tablet TAKE 1 TABLET BY MOUTH EVERYDAY AT BEDTIME 90 tablet 3   nitroGLYCERIN (NITROSTAT) 0.4 MG SL tablet Place 1 tablet (0.4 mg total) under the tongue every 5 (five) minutes x 3 doses as needed for chest pain. 25 tablet 2   pantoprazole (PROTONIX) 40 MG tablet Take 1 tablet (40 mg total) by mouth 2 (two) times daily. 180 tablet 3   potassium chloride (KLOR-CON) 10 MEQ tablet TAKE 1 TABLET BY MOUTH EVERY DAY 90 tablet 1   sodium bicarbonate 650 MG tablet Take 650 mg by mouth 2 (two) times daily.     Syringe/Needle, Disp, (SYRINGE 3CC/25GX1") 25G X 1" 3 ML MISC Use to inject into the skin 2x a day 100 each 3   vitamin B-12 (CYANOCOBALAMIN) 100 MCG tablet Take 100 mcg by mouth daily.     cholecalciferol (VITAMIN D3) 25 MCG (1000 UNIT) tablet Take 1,000 Units by mouth daily. Isn't taking regularly (Patient not taking: Reported on 01/12/2023)     ondansetron (ZOFRAN) 8 MG tablet Take 1 tablet (8 mg total) by mouth every 8 (eight) hours as needed for nausea or vomiting. 20 tablet 0   predniSONE (DELTASONE) 10 MG tablet Take 4tabs x2days, 2tabs x2days, 1tab x2days, then stop 14 tablet 0   Current  Facility-Administered Medications  Medication Dose Route Frequency Provider Last Rate Last Admin   ipratropium-albuterol (DUONEB) 0.5-2.5 (3) MG/3ML nebulizer solution 3 mL  3 mL Nebulization Once Olive Bass, FNP        Allergies: Aspirin and Ramipril  Past Medical History:  Diagnosis Date   Allergic rhinitis    Anterior chest wall pain    Anxiety    Asthma    Chronic diastolic CHF (congestive heart failure)    Echo 01/2020: EF 60-65, normal wall motion, mild LVH, normal RV SF, RVSP 52.6 (moderate elevation), severe LAE, moderate RAE, trivial MR, mild MS (mean gradient 5.5 mmHg), mild aortic valve sclerosis (no AS); elevated E/e' c/w elevated LVEDP   Cough    DM type  2 (diabetes mellitus, type 2)    GERD (gastroesophageal reflux disease)    History of diverticulitis of colon    HTN (hypertension)    Hyperlipidemia    IBS (irritable bowel syndrome)    Iron deficiency anemia    Mitral stenosis 04/17/2022   Echo 04/2022: EF 55-60, no RWMA, mild LVH, normal RVSF, normal PASP, moderate LAE, moderate mitral stenosis (mean gradient 4 mmHg), trivial MR, trivial AI, AV sclerosis without stenosis   Osteoporosis, unspecified    Renal insufficiency 07/31/2017   UTI (urinary tract infection)     Past Surgical History:  Procedure Laterality Date   CARDIOVASCULAR STRESS TEST  02/25/04   CORONARY BALLOON ANGIOPLASTY N/A 12/28/2021   Procedure: CORONARY BALLOON ANGIOPLASTY;  Surgeon: Kathleene Hazel, MD;  Location: [facility];  Service: Cardiovascular;  Laterality: N/A;   CORONARY STENT INTERVENTION N/A 12/28/2021   Procedure: CORONARY STENT INTERVENTION;  Surgeon: Kathleene Hazel, MD;  Location: [facility];  Service: Cardiovascular;  Laterality: N/A;   ESOPHAGOGASTRODUODENOSCOPY  12/26/01   PACEMAKER IMPLANT N/A 08/02/2020   Procedure: PACEMAKER IMPLANT;  Surgeon: Duke Salvia, MD;  [facility];  Service: Cardiovascular;  Laterality: N/A;    RIGHT OOPHORECTOMY  jan 2010   RIGHT/LEFT HEART CATH AND CORONARY ANGIOGRAPHY N/A 12/28/2021   Procedure: RIGHT/LEFT HEART CATH AND CORONARY ANGIOGRAPHY;  Surgeon: Kathleene Hazel, MD;  Location: [facility];  Service: Cardiovascular;  Laterality: N/A;    Family History  Problem Relation Age of Onset   Lung cancer Brother    Melanoma Brother    Hypertension Mother    Stroke Mother    Other Father        poor circulation   Cancer Sister    Diabetes Daughter    Atopy Neg Hx    Asthma Neg Hx    Breast cancer Neg Hx     Social History   Tobacco Use   Smoking status: Former    Packs/day: 0.30    Years: 5.00    Additional pack years: 0.00    Total pack years: 1.50    Types: Cigarettes    Quit date: 10/02/1986    Years since quitting: 36.3   Smokeless tobacco: Never  Substance Use Topics   Alcohol use: No    Subjective:  Patient was seen with suspected asthma flare on 01/01/2023; was treated with 7 day course of prednisone; called back on 01/05/23 with continued symptoms and Doxycycline and Tessalon Perles were added; finished Doxycycline yesterday but is concerned that "just not feeling any better." Still feeling very winded; Daughter accompanies patient today;     Objective:  Vitals:   01/12/23 1054  BP: 120/82  Pulse: 75  SpO2: 100%  Weight: 170 lb 3.2 oz (77.2 kg)  Height: 5\' 7"  (1.702 m)    General: Well developed, well nourished, in no acute distress  Skin : Warm and dry.  Head: Normocephalic and atraumatic  Eyes: Sclera and conjunctiva clear; pupils round and reactive to light; extraocular movements intact  Ears: External normal; canals clear; tympanic membranes normal  Oropharynx: Pink, supple. No suspicious lesions  Neck: Supple without thyromegaly, adenopathy  Lungs: Respirations unlabored; coarse breath sounds noted in all 4 lobes- improved after breathing treatment CVS exam: normal rate and regular rhythm.  Abdomen: Soft; nontender; nondistended;  normoactive bowel sounds; no masses or hepatosplenomegaly  Musculoskeletal: No deformities; no active joint inflammation  Extremities: No edema, cyanosis, clubbing  Vessels:  Symmetric bilaterally  Neurologic: Alert and oriented; speech intact; face symmetrical; moves all extremities well; CNII-XII intact without focal deficit  Assessment:  1. Persistent cough     Plan:  Albuterol nebulizer treatment given with mild improvement; will update CXR today; Rx for Z-pak, prednisone and Zofran; she is encouraged to use her nebulizer albuterol at least 2 x per day for the next few days; increase fluids, rest and follow up to be determined.   No follow-ups on file.  Orders Placed This Encounter  Procedures   DG Chest 2 View    Standing Status:   Future    Number of Occurrences:   1    Standing Expiration Date:   01/12/2024    Order Specific Question:   Reason for Exam (SYMPTOM  OR DIAGNOSIS REQUIRED)    Answer:   persistent cough    Order Specific Question:   Preferred imaging location?    Answer:   [facility]    Requested Prescriptions   Signed Prescriptions Disp Refills   azithromycin (ZITHROMAX Z-PAK) 250 MG tablet 6 each 0    Sig: Take 2 tablets po qd x 1 days; then take 1 tablet qd x days 2-5;   ondansetron (ZOFRAN) 8 MG tablet 20 tablet 0    Sig: Take 1 tablet (8 mg total) by mouth every 8 (eight) hours as needed for nausea or vomiting.   predniSONE (DELTASONE) 10 MG tablet 14 tablet 0    Sig: Take 4tabs x2days, 2tabs x2days, 1tab x2days, then stop

## 2023-01-15 ENCOUNTER — Telehealth: Payer: Self-pay | Admitting: *Deleted

## 2023-01-15 NOTE — Telephone Encounter (Signed)
-----   Message from Olive Bass, FNP sent at 01/15/2023 11:59 AM EDT ----- No acute changes; please call and see how she is feeling.

## 2023-01-15 NOTE — Telephone Encounter (Signed)
Patient stated that she is feeling a little better.  She just has a little congestion.  I ask where congestion was and she stated "I think in my chest and nose".  She has been just taking the medications that were given.

## 2023-01-23 ENCOUNTER — Other Ambulatory Visit: Payer: Self-pay | Admitting: Internal Medicine

## 2023-01-26 ENCOUNTER — Ambulatory Visit (INDEPENDENT_AMBULATORY_CARE_PROVIDER_SITE_OTHER): Payer: 59 | Admitting: Podiatry

## 2023-01-26 ENCOUNTER — Encounter: Payer: Self-pay | Admitting: Podiatry

## 2023-01-26 DIAGNOSIS — M79674 Pain in right toe(s): Secondary | ICD-10-CM | POA: Diagnosis not present

## 2023-01-26 DIAGNOSIS — L84 Corns and callosities: Secondary | ICD-10-CM

## 2023-01-26 DIAGNOSIS — M79675 Pain in left toe(s): Secondary | ICD-10-CM

## 2023-01-26 DIAGNOSIS — Q828 Other specified congenital malformations of skin: Secondary | ICD-10-CM | POA: Diagnosis not present

## 2023-01-26 DIAGNOSIS — B351 Tinea unguium: Secondary | ICD-10-CM | POA: Diagnosis not present

## 2023-01-26 DIAGNOSIS — E1151 Type 2 diabetes mellitus with diabetic peripheral angiopathy without gangrene: Secondary | ICD-10-CM

## 2023-01-29 ENCOUNTER — Ambulatory Visit (INDEPENDENT_AMBULATORY_CARE_PROVIDER_SITE_OTHER): Payer: 59

## 2023-01-29 DIAGNOSIS — I442 Atrioventricular block, complete: Secondary | ICD-10-CM | POA: Diagnosis not present

## 2023-01-29 NOTE — Progress Notes (Signed)
  Subjective:  Patient ID: [name], female    DOB: October 19, 1935,  MRN: [id number]  [name] presents to clinic today for at risk foot care. Pt has h/o NIDDM with PAD  Chief Complaint  Patient presents with   Diabetes    DFC BS - DIDN'T CHECK IT THIS MORNING, ONLY CHECKS IT TWICE A WK A1C - 5 [facility]P - 12/2022   New problem(s): None.   PCP is Olive Bass, FNP.  Allergies  Allergen Reactions   Aspirin Shortness Of Breath and Other (See Comments)    Caused asthma symptoms   Ramipril Cough    Review of Systems: Negative except as noted in the HPI.  Objective: No changes noted in today's physical examination. There were no vitals filed for this visit. [name] is a pleasant 87 y.o. female in NAD. AAO x 3.  Vascular Examination: CFT <3 seconds b/l. DP/PT pulses faintly palpable b/l. Skin temperature gradient warm to warm b/l. No pain with calf compression. No ischemia or gangrene. No cyanosis or clubbing noted b/l. Pedal hair absent.   Neurological Examination: Sensation grossly intact b/l with 10 gram monofilament. Vibratory sensation intact b/l.   Dermatological Examination: Pedal skin warm and supple b/l. Toenails 1-5 b/l thick, discolored, elongated with subungual debris and pain on dorsal palpation.  No open wounds b/l LE. No interdigital macerations noted b/l LE.  Hyperkeratotic lesion(s) sub 5th met base left lower extremity.  No erythema, no edema, no drainage, no fluctuance. Porokeratotic lesion(s) left heel. No erythema, no edema, no drainage, no fluctuance.  Musculoskeletal Examination: Muscle strength 5/5 to b/l LE. HAV with bunion deformity noted b/l LE. Tailor's bunion deformity noted b/l LE. Hammertoe deformity noted 2-5 b/l.  Radiographs: None  Assessment/Plan: 1. Pain due to onychomycosis of toenails of both feet   2. Porokeratosis   3. Callus   4. Type II diabetes mellitus with peripheral circulatory disorder (HCC)     -Consent given for  treatment as described below: -Examined patient. -Continue foot and shoe inspections daily. Monitor blood glucose per PCP/Endocrinologist's recommendations. -Mycotic toenails 1-5 bilaterally were debrided in length and girth with sterile nail nippers and dremel without incident. -Callus(es) sub 5th met base left lower extremity pared utilizing sterile scalpel blade without complication or incident. Total number debrided =1. -Porokeratotic lesion(s) left heel pared and enucleated with sterile currette without incident. Total number of lesions debrided=1. -Patient/POA to call should there be question/concern in the interim.   Return in about 3 months (around 04/27/2023).  Freddie Breech, DPM

## 2023-01-30 LAB — CUP PACEART REMOTE DEVICE CHECK
Battery Remaining Longevity: 132 mo
Battery Voltage: 3.02 V
Brady Statistic RV Percent Paced: 99.88 %
Date Time Interrogation Session: 20240428203702
Implantable Lead Connection Status: 753985
Implantable Lead Implant Date: 20211101
Implantable Lead Location: 753860
Implantable Lead Model: 3830
Implantable Pulse Generator Implant Date: 20211101
Lead Channel Impedance Value: 342 Ohm
Lead Channel Impedance Value: 437 Ohm
Lead Channel Pacing Threshold Amplitude: 0.875 V
Lead Channel Pacing Threshold Pulse Width: 0.4 ms
Lead Channel Sensing Intrinsic Amplitude: 7 mV
Lead Channel Sensing Intrinsic Amplitude: 7 mV
Lead Channel Setting Pacing Amplitude: 2 V
Lead Channel Setting Pacing Pulse Width: 0.4 ms
Lead Channel Setting Sensing Sensitivity: 0.9 mV
Zone Setting Status: 755011

## 2023-02-01 ENCOUNTER — Other Ambulatory Visit (INDEPENDENT_AMBULATORY_CARE_PROVIDER_SITE_OTHER): Payer: 59

## 2023-02-01 DIAGNOSIS — Z794 Long term (current) use of insulin: Secondary | ICD-10-CM | POA: Diagnosis not present

## 2023-02-01 DIAGNOSIS — E1122 Type 2 diabetes mellitus with diabetic chronic kidney disease: Secondary | ICD-10-CM

## 2023-02-01 DIAGNOSIS — N183 Chronic kidney disease, stage 3 unspecified: Secondary | ICD-10-CM

## 2023-02-01 LAB — HEMOGLOBIN A1C: Hgb A1c MFr Bld: 6.8 % — ABNORMAL HIGH (ref 4.6–6.5)

## 2023-02-01 LAB — GLUCOSE, RANDOM: Glucose, Bld: 142 mg/dL — ABNORMAL HIGH (ref 70–99)

## 2023-02-02 LAB — FRUCTOSAMINE: Fructosamine: 325 umol/L — ABNORMAL HIGH (ref 0–285)

## 2023-02-05 ENCOUNTER — Encounter: Payer: Self-pay | Admitting: "Endocrinology

## 2023-02-05 ENCOUNTER — Ambulatory Visit (INDEPENDENT_AMBULATORY_CARE_PROVIDER_SITE_OTHER): Payer: 59 | Admitting: "Endocrinology

## 2023-02-05 VITALS — BP 124/70 | HR 71 | Ht 67.0 in | Wt 170.6 lb

## 2023-02-05 DIAGNOSIS — Z794 Long term (current) use of insulin: Secondary | ICD-10-CM | POA: Diagnosis not present

## 2023-02-05 DIAGNOSIS — E7800 Pure hypercholesterolemia, unspecified: Secondary | ICD-10-CM

## 2023-02-05 DIAGNOSIS — N184 Chronic kidney disease, stage 4 (severe): Secondary | ICD-10-CM | POA: Diagnosis not present

## 2023-02-05 DIAGNOSIS — E663 Overweight: Secondary | ICD-10-CM

## 2023-02-05 DIAGNOSIS — E1122 Type 2 diabetes mellitus with diabetic chronic kidney disease: Secondary | ICD-10-CM

## 2023-02-05 LAB — COMPREHENSIVE METABOLIC PANEL
ALT: 17 U/L (ref 0–35)
AST: 21 U/L (ref 0–37)
Albumin: 3.7 g/dL (ref 3.5–5.2)
Alkaline Phosphatase: 85 U/L (ref 39–117)
BUN: 34 mg/dL — ABNORMAL HIGH (ref 6–23)
CO2: 24 mEq/L (ref 19–32)
Calcium: 9.7 mg/dL (ref 8.4–10.5)
Chloride: 104 mEq/L (ref 96–112)
Creatinine, Ser: 2.42 mg/dL — ABNORMAL HIGH (ref 0.40–1.20)
GFR: 17.88 mL/min — ABNORMAL LOW (ref >60.00)
Glucose, Bld: 142 mg/dL — ABNORMAL HIGH (ref 70–99)
Potassium: 3.8 mEq/L (ref 3.5–5.1)
Sodium: 142 mEq/L (ref 135–145)
Total Bilirubin: 0.8 mg/dL (ref 0.2–1.2)
Total Protein: 6.8 g/dL (ref 6.0–8.3)

## 2023-02-05 LAB — LIPID PANEL
Cholesterol: 105 mg/dL (ref 0–200)
HDL: 47 mg/dL (ref >39.00)
LDL Cholesterol: 41 mg/dL (ref 0–99)
NonHDL: 58.48
Total CHOL/HDL Ratio: 2
Triglycerides: 89 mg/dL (ref 0.0–149.0)
VLDL: 17.8 mg/dL (ref 0.0–40.0)

## 2023-02-05 NOTE — Progress Notes (Signed)
Outpatient Endocrinology Note Sheryl Nerstrand, MD    [name] 01-28-1936 [id number]  Referring Provider: Olive Bass,* Primary Care Provider: Olive Bass, FNP Reason for consultation: Subjective  Type 2 diabetes mellitus  Assessment & Plan  Lewis was seen today for follow-up.  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, Stage 4 CKD Hba1c goal less than 7.0, current Hba1c is 6.8 Will recommend for the following change of medications to: Continue current regimen, take humalog before meals (pt is taking different bedtime dose but denies hypoglycemia) Check BG TIDAC and back off of insulin by 2 units if BG <70  No known contraindications to any of above medications Pacemaker restricts use of libre-not studied  Hyperlipidemia, overweight  -Last LDL unknown -on atorvastatin 80 mg QD -Ordered lab -Follow low fat diet and exercise    -Blood pressure goal <140/90 - Microalbumin/creatinine on goal < 30 in 08/2022, GFR 16 -No on ACE/ARB, deferred to nephrology  -diet changes including salt restriction -limit eating outside -counseled BP targets per standards of diabetes care -Uncontrolled blood pressure can lead to retinopathy, nephropathy and cardiovascular and atherosclerotic heart disease  Reviewed and counseled on: -A1C target -Blood sugar targets -Complications of uncontrolled diabetes  -Checking blood sugar before meals and bedtime and bring log next visit -All medications with mechanism of action and side effects -Hypoglycemia management: rule of 15's, Glucagon Emergency Kit and medical alert ID -low-carb low-fat plate-method diet -At least 20  minutes of physical activity per day -Annual dilated retinal eye exam and foot exam -compliance and follow up needs -follow up as scheduled or earlier if problem gets worse  Call if blood sugar is less than 70 or consistently above 250    Take a 15 gm snack of carbohydrate at bedtime before you go to sleep if your blood sugar is less than 100.    If you are going to fast after midnight for a test or procedure, ask your physician for instructions on how to reduce/decrease your insulin dose.    Call if blood sugar is less than 70 or consistently above 250  -Treating a low sugar by rule of 15  (15 gms of sugar every 15 min until sugar is more than 70) If you feel your sugar is low, test your sugar to be sure If your sugar is low (less than 70), then take 15 grams of a fast acting Carbohydrate (3-4 glucose tablets or glucose gel or 4 ounces of juice or regular soda) Recheck your sugar 15 min after treating low to make sure it is more than 70 If sugar is still less than 70, treat again with 15 grams of carbohydrate          Don't drive the hour of hypoglycemia  If unconscious/unable to eat or drink by mouth, use glucagon injection or nasal spray baqsimi and call 911. Can repeat again in 15 min if still unconscious.  Return in about 3 months (around 05/08/2023).   I spent more than 50% of today's visit counseling patient on symptoms, examination findings, lab findings, imaging results, treatment decisions and monitoring  and prognosis. The patient understood the recommendations and agrees with the treatment plan. All questions regarding treatment plan were fully answered.  Sheryl Harman, MD  02/05/23    History of Present Illness [name] is a 87 y.o. year old female who presents for evaluation of Type 2 diabetes mellitus.  [name] was first diagnosed in 1996.    Patient is accompanied by daughter who lives with her   Home diabetes regimen: Non-insulin hypoglycemic drugs:  Farxiga 5 mg daily  Insulin regimen: Humalog 3 units five min before breakfast and 18 before dinner an hour after eating   Previous history:  Non-insulin hypoglycemic drugs previously used: Unknown Insulin was started in 2008 Previously on Lantus insulin that was stopped in 2021   COMPLICATIONS +  MI s/p stents  -  retinopathy, last eye exam 2023 -  neuropathy +  nephropathy   BLOOD SUGAR DATA No data brought, not check BG  Physical Exam  BP 124/70 (BP Location: Left Arm, Patient Position: Sitting, Cuff Size: Normal)   Pulse 71   Ht 5\' 7"  (1.702 m)   Wt 170 lb 9.6 oz (77.4 kg)   SpO2 99%   BMI 26.72 kg/m    Constitutional: well developed, well nourished Head: normocephalic, atraumatic Eyes: sclera anicteric, no redness Neck: supple Lungs: normal respiratory effort Neurology: alert and oriented Skin: dry, no appreciable rashes Musculoskeletal: no appreciable defects Psychiatric: normal mood and affect Diabetic Foot Exam - Simple   No data filed      Current Medications Patient's Medications  New Prescriptions   No medications on file  Previous Medications   ACETAMINOPHEN (TYLENOL) 325 MG TABLET    Take 650 mg by mouth every 6 (six) hours as needed for pain.   ALBUTEROL (PROAIR HFA) 108 (90 BASE) MCG/ACT INHALER    2 puffs every 4 hours as needed only  if your can't catch your breath   ALBUTEROL (PROVENTIL) (2.5 MG/3ML) 0.083% NEBULIZER SOLUTION    Take 3 mLs (2.5 mg total) by nebulization every 6 (six) hours as needed for wheezing or shortness of breath.   ASCORBIC ACID (VITAMIN C) 1000 MG TABLET    Take 1,000 mg by mouth daily.   ATORVASTATIN (LIPITOR) 80 MG TABLET    Take 1 tablet (80 mg total) by mouth daily.   AZITHROMYCIN (ZITHROMAX Z-PAK) 250 MG TABLET    Take 2 tablets po qd x 1 days; then take 1 tablet qd x days 2-5;   BD VEO INSULIN SYRINGE U/F 31G X 15/64" 0.5 ML MISC    Use as instructed   BLOOD GLUCOSE MONITORING SUPPL (ONETOUCH VERIO FLEX SYSTEM)  W/DEVICE KIT    USE AS DIRECTED   BUDESONIDE-FORMOTEROL (SYMBICORT) 160-4.5 MCG/ACT INHALER    TAKE 2 PUFFS FIRST THING IN AM AND THEN ANOTHER 2 PUFFS ABOUT 12 HOURS LATER.   CARVEDILOL (COREG) 3.125 MG TABLET    Take 1 tablet (3.125 mg total) by mouth 2 (two) times daily with a meal.   CETIRIZINE (ZYRTEC ALLERGY) 10 MG TABLET    Take 1 tablet (10 mg total) by mouth daily as needed for allergies.   CHOLECALCIFEROL (VITAMIN D3) 25 MCG (1000 UNIT) TABLET    Take 1,000 Units by mouth daily. Isn't taking regularly   CLOPIDOGREL (PLAVIX) 75 MG TABLET    TAKE 1 TABLET BY MOUTH DAILY WITH BREAKFAST.   DICLOFENAC SODIUM (VOLTAREN) 1 % GEL    Apply 4 g topically 4 (four) times daily as needed (  pain).   ELIQUIS 2.5 MG TABS TABLET    TAKE 1 TABLET BY MOUTH TWICE A DAY   FAMOTIDINE (PEPCID) 20 MG TABLET    Take 1 tablet (20 mg total) by mouth 2 (two) times daily.   FARXIGA 5 MG TABS TABLET    TAKE 1 TABLET BY MOUTH DAILY BEFORE BREAKFAST.   FUROSEMIDE (LASIX) 40 MG TABLET    Take 1 tablet (40 mg total) by mouth 2 (two) times daily.   GLUCOSE BLOOD (ONETOUCH VERIO) TEST STRIP    1 each by Other route 2 (two) times daily. And lancets 2/day   GLUCOSE BLOOD TEST STRIP    Check blood sugar twice a day   HYDRALAZINE (APRESOLINE) 25 MG TABLET    Take 3 tablets (75 mg total) by mouth 3 (three) times daily.   INSULIN LISPRO (HUMALOG) 100 UNIT/ML INJECTION    Give 3 units with BREAKFAST, AND 5 units with SUPPER   LATANOPROST (XALATAN) 0.005 % OPHTHALMIC SOLUTION    Place 1 drop into both eyes at bedtime.   MONTELUKAST (SINGULAIR) 10 MG TABLET    TAKE 1 TABLET BY MOUTH EVERYDAY AT BEDTIME   NITROGLYCERIN (NITROSTAT) 0.4 MG SL TABLET    Place 1 tablet (0.4 mg total) under the tongue every 5 (five) minutes x 3 doses as needed for chest pain.   ONDANSETRON (ZOFRAN) 8 MG TABLET    Take 1 tablet (8 mg total) by mouth every 8 (eight) hours as needed for nausea or vomiting.   PANTOPRAZOLE (PROTONIX) 40 MG TABLET    Take 1  tablet (40 mg total) by mouth 2 (two) times daily.   POTASSIUM CHLORIDE (KLOR-CON) 10 MEQ TABLET    TAKE 1 TABLET BY MOUTH EVERY DAY   PREDNISONE (DELTASONE) 10 MG TABLET    Take 4tabs x2days, 2tabs x2days, 1tab x2days, then stop   SODIUM BICARBONATE 650 MG TABLET    Take 650 mg by mouth 2 (two) times daily.   SYRINGE/NEEDLE, DISP, (SYRINGE 3CC/25GX1") 25G X 1" 3 ML MISC    Use to inject into the skin 2x a day   VITAMIN B-12 (CYANOCOBALAMIN) 100 MCG TABLET    Take 100 mcg by mouth daily.  Modified Medications   No medications on file  Discontinued Medications   No medications on file    Allergies Allergies  Allergen Reactions   Aspirin Shortness Of Breath and Other (See Comments)    Caused asthma symptoms   Ramipril Cough    Past Medical History Past Medical History:  Diagnosis Date   Allergic rhinitis    Anterior chest wall pain    Anxiety    Asthma    Chronic diastolic CHF (congestive heart failure) (HCC)    Echo 01/2020: EF 60-65, normal wall motion, mild LVH, normal RV SF, RVSP 52.6 (moderate elevation), severe LAE, moderate RAE, trivial MR, mild MS (mean gradient 5.5 mmHg), mild aortic valve sclerosis (no AS); elevated E/e' c/w elevated LVEDP   Cough    DM type 2 (diabetes mellitus, type 2) (HCC)    GERD (gastroesophageal reflux disease)    History of diverticulitis of colon    HTN (hypertension)    Hyperlipidemia    IBS (irritable bowel syndrome)    Iron deficiency anemia    Mitral stenosis 04/17/2022   Echo 04/2022: EF 55-60, no RWMA, mild LVH, normal RVSF, normal PASP, moderate LAE, moderate mitral stenosis (mean gradient 4 mmHg), trivial MR, trivial AI, AV sclerosis without stenosis   Osteoporosis, unspecified  Renal insufficiency 07/31/2017   UTI (urinary tract infection)     Past Surgical History Past Surgical History:  Procedure Laterality Date   CARDIOVASCULAR STRESS TEST  02/25/04   CORONARY BALLOON ANGIOPLASTY N/A 12/28/2021   Procedure: CORONARY BALLOON  ANGIOPLASTY;  Surgeon: Kathleene Hazel, MD;  Location: [facility] INVASIVE [facility];  Service: Cardiovascular;  Laterality: N/A;   CORONARY STENT INTERVENTION N/A 12/28/2021   Procedure: CORONARY STENT INTERVENTION;  Surgeon: Kathleene Hazel, MD;  Location: [facility];  Service: Cardiovascular;  Laterality: N/A;   ESOPHAGOGASTRODUODENOSCOPY  12/26/01   PACEMAKER IMPLANT N/A 08/02/2020   Procedure: PACEMAKER IMPLANT;  Surgeon: Duke Salvia, MD;  Location: [facility];  Service: Cardiovascular;  Laterality: N/A;   RIGHT OOPHORECTOMY  jan 2010   RIGHT/LEFT HEART CATH AND CORONARY ANGIOGRAPHY N/A 12/28/2021   Procedure: RIGHT/LEFT HEART CATH AND CORONARY ANGIOGRAPHY;  Surgeon: Kathleene Hazel, MD;  Location: [facility] INVASIVE CV LAB;  Service: Cardiovascular;  Laterality: N/A;    Family History family history includes Cancer in her sister; Diabetes in her daughter; Hypertension in her mother; Lung cancer in her brother; Melanoma in her brother; Other in her father; Stroke in her mother.  Social History Social History   Socioeconomic History   Marital status: Widowed    Spouse name: Not on file   Number of children: Not on file   Years of education: Not on file   Highest education level: Not on file  Occupational History   Occupation: retired  Tobacco Use   Smoking status: Former    Packs/day: 0.30    Years: 5.00    Additional pack years: 0.00    Total pack years: 1.50    Types: Cigarettes    Quit date: 10/02/1986    Years since quitting: 36.3   Smokeless tobacco: Never  Vaping Use   Vaping Use: Never used  Substance and Sexual Activity   Alcohol use: No   Drug use: No   Sexual activity: Not on file  Other Topics Concern   Not on file  Social History Narrative   Not on file   Social Determinants of Health   Financial Resource Strain: Low Risk  (04/06/2022)   Overall Financial Resource Strain (CARDIA)    Difficulty of Paying Living Expenses: Not hard at  all  Food Insecurity: No Food Insecurity (04/06/2022)   Hunger Vital Sign    Worried About Running Out of Food in the Last Year: Never true    Ran Out of Food in the Last Year: Never true  Transportation Needs: No Transportation Needs (04/06/2022)   PRAPARE - Administrator, Civil Service (Medical): No    Lack of Transportation (Non-Medical): No  Physical Activity: Insufficiently Active (04/06/2022)   Exercise Vital Sign    Days of Exercise per Week: 7 days    Minutes of Exercise per Session: 10 min  Stress: No Stress Concern Present (04/06/2022)   [employer] of Occupational Health - Occupational Stress Questionnaire    Feeling of Stress : Not at all  Social Connections: Moderately Integrated (04/06/2022)   Social Connection and Isolation Panel [NHANES]    Frequency of Communication with Friends and Family: More than three times a week    Frequency of Social Gatherings with Friends and Family: More than three times a week    Attends Religious Services: More than 4 times per year    Active Member of Clubs or Organizations: Yes    Attends  Club or Organization Meetings: More than 4 times per year    Marital Status: Widowed  Intimate Partner Violence: Not At Risk (04/06/2022)   Humiliation, Afraid, Rape, and Kick questionnaire    Fear of Current or Ex-Partner: No    Emotionally Abused: No    Physically Abused: No    Sexually Abused: No    Lab Results  Component Value Date   HGBA1C 6.8 (H) 02/01/2023   Lab Results  Component Value Date   CHOL 120 12/23/2021   Lab Results  Component Value Date   HDL 55 12/23/2021   Lab Results  Component Value Date   LDLCALC 56 12/23/2021   Lab Results  Component Value Date   TRIG 46 12/23/2021   Lab Results  Component Value Date   CHOLHDL 2.2 12/23/2021   Lab Results  Component Value Date   CREATININE 2.54 (H) 10/13/2022   Lab Results  Component Value Date   GFR 16.91 (L) 10/13/2022   Lab Results  Component Value Date    MICROALBUR 17.0 (H) 04/12/2016      Component Value Date/Time   NA 141 10/13/2022 0858   NA 141 08/23/2022 0000   K 4.0 10/13/2022 0858   CL 105 10/13/2022 0858   CO2 24 10/13/2022 0858   GLUCOSE 142 (H) 02/01/2023 0813   BUN 49 (H) 10/13/2022 0858   BUN 46 (A) 08/23/2022 0000   CREATININE 2.54 (H) 10/13/2022 0858   CREATININE 1.74 (H) 08/28/2016 1042   CALCIUM 9.5 10/13/2022 0858   CALCIUM 10.2 07/06/2011 1144   PROT 6.6 08/03/2022 0939   ALBUMIN 3.7 08/03/2022 0939   AST 19 08/03/2022 0939   ALT 17 08/03/2022 0939   ALKPHOS 83 08/03/2022 0939   BILITOT 0.7 08/03/2022 0939   GFRNONAA 21 (L) 01/23/2022 1842   GFRAA 28 (L) 08/06/2020 1526      Latest Ref Rng & Units 02/01/2023    8:13 AM 10/13/2022    8:58 AM 08/23/2022   12:00 AM  BMP  Glucose 70 - 99 mg/dL 161  096    BUN 6 - 23 mg/dL  49  46      Creatinine 0.40 - 1.20 mg/dL  0.45  2.6      Sodium 135 - 145 mEq/L  141  141      Potassium 3.5 - 5.1 mEq/L  4.0  4.4      Chloride 96 - 112 mEq/L  105  104      CO2 19 - 32 mEq/L  24  24      Calcium 8.4 - 10.5 mg/dL  9.5  9.4         This result is from an external source.       Component Value Date/Time   WBC 5.8 08/03/2022 0939   RBC 4.25 08/03/2022 0939   HGB 11.7 (L) 08/03/2022 0939   HGB 11.0 (L) 07/28/2021 0852   HCT 35.4 (L) 08/03/2022 0939   HCT 33.0 (L) 07/28/2021 0852   PLT 144.0 (L) 08/03/2022 0939   PLT 162 07/28/2021 0852   MCV 83.2 08/03/2022 0939   MCV 80 07/28/2021 0852   MCH 27.1 01/23/2022 1842   MCHC 33.2 08/03/2022 0939   RDW 15.8 (H) 08/03/2022 0939   RDW 15.6 (H) 07/28/2021 0852   LYMPHSABS 1.1 08/03/2022 0939   MONOABS 1.0 08/03/2022 0939   EOSABS 0.3 08/03/2022 0939   BASOSABS 0.1 08/03/2022 0939     Parts of this note may have  been dictated using voice recognition software. There may be variances in spelling and vocabulary which are unintentional. Not all errors are proofread. Please notify the Thereasa Parkin if any discrepancies are  noted or if the meaning of any statement is not clear.

## 2023-02-06 NOTE — Progress Notes (Signed)
I called and spoke with the Clinical personnel at [facility] and she is going to route my email to the [facility] because she did not have a definite answer, then when I get a response I will fwd the email to you.

## 2023-02-12 DIAGNOSIS — E1122 Type 2 diabetes mellitus with diabetic chronic kidney disease: Secondary | ICD-10-CM | POA: Diagnosis not present

## 2023-02-12 DIAGNOSIS — N184 Chronic kidney disease, stage 4 (severe): Secondary | ICD-10-CM | POA: Diagnosis not present

## 2023-02-12 DIAGNOSIS — D509 Iron deficiency anemia, unspecified: Secondary | ICD-10-CM | POA: Diagnosis not present

## 2023-02-12 DIAGNOSIS — I129 Hypertensive chronic kidney disease with stage 1 through stage 4 chronic kidney disease, or unspecified chronic kidney disease: Secondary | ICD-10-CM | POA: Diagnosis not present

## 2023-02-12 DIAGNOSIS — I4891 Unspecified atrial fibrillation: Secondary | ICD-10-CM | POA: Diagnosis not present

## 2023-02-12 DIAGNOSIS — I5021 Acute systolic (congestive) heart failure: Secondary | ICD-10-CM | POA: Diagnosis not present

## 2023-02-12 DIAGNOSIS — E785 Hyperlipidemia, unspecified: Secondary | ICD-10-CM | POA: Diagnosis not present

## 2023-02-12 DIAGNOSIS — E872 Acidosis, unspecified: Secondary | ICD-10-CM | POA: Diagnosis not present

## 2023-02-13 LAB — LAB REPORT - SCANNED
Albumin, Urine POC: 7.4
Creatinine, Urine.: 159.8
Microalb Creat Ratio: 5

## 2023-02-26 ENCOUNTER — Other Ambulatory Visit: Payer: Self-pay | Admitting: Endocrinology

## 2023-03-01 NOTE — Progress Notes (Signed)
Remote pacemaker transmission.   

## 2023-03-03 ENCOUNTER — Other Ambulatory Visit: Payer: Self-pay | Admitting: Internal Medicine

## 2023-03-06 ENCOUNTER — Telehealth: Payer: Self-pay | Admitting: Family

## 2023-03-06 ENCOUNTER — Other Ambulatory Visit: Payer: Self-pay

## 2023-03-06 MED ORDER — ALBUTEROL SULFATE (2.5 MG/3ML) 0.083% IN NEBU: 2.5000 mg | INHALATION_SOLUTION | Freq: Four times a day (QID) | RESPIRATORY_TRACT | 1 refills | Status: DC | PRN

## 2023-03-06 NOTE — Telephone Encounter (Signed)
Rx has been sent in. 

## 2023-03-06 NOTE — Telephone Encounter (Signed)
Called pt to inform her Rx had been sent in and to check on pt, was not able to leave a VM.

## 2023-03-07 NOTE — Telephone Encounter (Signed)
Spoke with pt, pt is aware Rx has been sent in, pt states she feels "okay" she just has " a little asthma" but she's believes the Rx will help. Pt was advised to please call the office back and schedule an appointment if breathing does not improve.

## 2023-03-09 ENCOUNTER — Other Ambulatory Visit: Payer: Self-pay

## 2023-03-09 DIAGNOSIS — Z794 Long term (current) use of insulin: Secondary | ICD-10-CM

## 2023-03-09 MED ORDER — INSULIN LISPRO 100 UNIT/ML IJ SOLN: INTRAMUSCULAR | 1 refills | Status: DC

## 2023-03-09 NOTE — Telephone Encounter (Signed)
Sent!

## 2023-03-22 ENCOUNTER — Other Ambulatory Visit: Payer: Self-pay | Admitting: Physician Assistant

## 2023-03-23 NOTE — Telephone Encounter (Signed)
Pt's pharmacy is requesting a refill on pantoprazole. Would Dr. Ross like to refill this medication? Please address °

## 2023-03-29 ENCOUNTER — Telehealth: Payer: Self-pay | Admitting: Family

## 2023-03-29 ENCOUNTER — Other Ambulatory Visit: Payer: Self-pay | Admitting: Family Medicine

## 2023-03-29 ENCOUNTER — Telehealth: Payer: Self-pay | Admitting: Internal Medicine

## 2023-03-29 MED ORDER — PREDNISONE 10 MG PO TABS: ORAL_TABLET | ORAL | 0 refills | Status: DC

## 2023-03-29 NOTE — Telephone Encounter (Signed)
[name] (daughter DPR OK) called stating pt is having issues with asthma and was looking to get a refill of the following medication:   Prescription Request  03/29/2023  Is this a "Controlled Substance" medicine? No  LOV: 01/12/2023  What is the name of the medication or equipment?   predniSONE (DELTASONE) 10 MG tablet [[id number]]   Have you contacted your pharmacy to request a refill? No   Which pharmacy would you like this sent to?   [id number] [place], [place] - [address] [place] [address] Phone: [phone] Fax: [phone]  Patient notified that their request is being sent to the clinical staff for review and that they should receive a response within 2 business days.   Please advise at Mobile [phone] (mobile)

## 2023-03-29 NOTE — Telephone Encounter (Signed)
Pt daughter called in bc her mother is having a lil bit of asthma and wants to get prednisone  [facility] [address]

## 2023-03-29 NOTE — Telephone Encounter (Signed)
Spoke with daughter and she stated that they usually gets this filled.  She does not see pulmonology, daughter did not know who that was.  Are you ok with filling the medication without visit or have her come in to be evaluated?

## 2023-03-30 NOTE — Telephone Encounter (Signed)
Pt notified and they have already called pulmonology.

## 2023-04-03 DIAGNOSIS — E113291 Type 2 diabetes mellitus with mild nonproliferative diabetic retinopathy without macular edema, right eye: Secondary | ICD-10-CM | POA: Diagnosis not present

## 2023-04-03 LAB — HM DIABETES EYE EXAM

## 2023-04-03 MED ORDER — PREDNISONE 10 MG PO TABS: ORAL_TABLET | ORAL | 0 refills | Status: AC

## 2023-04-03 NOTE — Telephone Encounter (Signed)
Called [place] but she did not answer. Left message for her to call us back.

## 2023-04-03 NOTE — Telephone Encounter (Signed)
Pt. Daughter called back I made a closer apt. In Aug. But still waiting to speak to nurse on med. advise

## 2023-04-03 NOTE — Telephone Encounter (Signed)
Prednisone 10 mg take  4 each am x 2 days,   2 each am x 2 days,  1 each am x 2 days and stop   Needs f/u ov with all meds in hand to regropu.

## 2023-04-03 NOTE — Telephone Encounter (Signed)
Called and spoke with [place]. She verbalized understanding.   Nothing further needed at time of call.

## 2023-04-03 NOTE — Telephone Encounter (Signed)
Marcelino Duster daughter checking on message for patient having symptom of wheezing. Would like Prednisone. Marcelino Duster phone number is [phone].

## 2023-04-03 NOTE — Telephone Encounter (Signed)
Called and spoke with [name], patient's daughter. She stated that the patient has been struggling with her asthma over the past few days. She has been wheezing more, especially with activity. She does have a productive cough with clear phlegm. At the time of call, she was doing an albuterol breathing treatment. The breathing treatments help only for a few hours. Denied any fever or body aches.   [name] confirmed that she is still using her Symbicort 160 twice daily.   I did attempt to her scheduled with either an app or Dr. Sherene Sires in [place]. We did not have anything in [place] and the patient does not want to travel to [place].   [facility] [address]   Dr. Sherene Sires, can you please advise? Thanks!

## 2023-04-09 ENCOUNTER — Ambulatory Visit (HOSPITAL_COMMUNITY): Payer: 59 | Attending: Cardiovascular Disease

## 2023-04-09 ENCOUNTER — Other Ambulatory Visit: Payer: Self-pay | Admitting: Physician Assistant

## 2023-04-09 ENCOUNTER — Other Ambulatory Visit: Payer: Self-pay | Admitting: Endocrinology

## 2023-04-09 DIAGNOSIS — I361 Nonrheumatic tricuspid (valve) insufficiency: Secondary | ICD-10-CM

## 2023-04-09 DIAGNOSIS — E1122 Type 2 diabetes mellitus with diabetic chronic kidney disease: Secondary | ICD-10-CM

## 2023-04-09 DIAGNOSIS — I050 Rheumatic mitral stenosis: Secondary | ICD-10-CM | POA: Insufficient documentation

## 2023-04-09 LAB — ECHOCARDIOGRAM COMPLETE
Area-P 1/2: 2.76 cm2
MV VTI: 1.32 cm2
S' Lateral: 3.5 cm

## 2023-04-10 ENCOUNTER — Other Ambulatory Visit: Payer: Self-pay

## 2023-04-10 MED ORDER — CARVEDILOL 3.125 MG PO TABS: 3.1250 mg | ORAL_TABLET | Freq: Two times a day (BID) | ORAL | 1 refills | Status: DC

## 2023-04-10 NOTE — Telephone Encounter (Signed)
Pt's medication was sent to pt's pharmacy as requested. Confirmation received.  °

## 2023-04-11 ENCOUNTER — Telehealth: Payer: Self-pay | Admitting: "Endocrinology

## 2023-04-11 DIAGNOSIS — N183 Chronic kidney disease, stage 3 unspecified: Secondary | ICD-10-CM

## 2023-04-11 MED ORDER — INSULIN LISPRO 100 UNIT/ML IJ SOLN: INTRAMUSCULAR | 1 refills | Status: DC

## 2023-04-11 NOTE — Telephone Encounter (Signed)
MEDICATION: Humalog  [facility]ACY[facility] [address]  HAS THE PATIENT CONTACTED THEIR PHARMACY?  yes  IS THIS A 90 DAY SUPPLY : patient did not know  IS PATIENT OUT OF MEDICATION: yes  IF NOT; HOW MUCH IS LEFT: none  LAST APPOINTMENT DATE: @7 /05/2023  NEXT APPOINTMENT DATE:@8 /04/2023  DO WE HAVE YOUR PERMISSION TO LEAVE A DETAILED MESSAGE?: yes @ [phone]  OTHER COMMENTS:    **Let patient know to contact pharmacy at the end of the day to make sure medication is ready. **  ** Please notify patient to allow 48-72 hours to process**  **Encourage patient to contact the pharmacy for refills or they can request refills through [facility]**

## 2023-04-11 NOTE — Telephone Encounter (Signed)
[facility] [address]

## 2023-04-18 NOTE — Progress Notes (Signed)
Pt did not answer the phone for AWV call. This encounter was created in error - please disregard.

## 2023-04-20 ENCOUNTER — Encounter: Payer: Self-pay | Admitting: Family

## 2023-04-20 ENCOUNTER — Ambulatory Visit (INDEPENDENT_AMBULATORY_CARE_PROVIDER_SITE_OTHER): Payer: 59 | Admitting: Family

## 2023-04-20 ENCOUNTER — Ambulatory Visit (HOSPITAL_BASED_OUTPATIENT_CLINIC_OR_DEPARTMENT_OTHER)
Admission: RE | Admit: 2023-04-20 | Discharge: 2023-04-20 | Disposition: A | Discharge status: Home / Self Care | Payer: 59 | Source: Ambulatory Visit | Attending: Family | Admitting: Family

## 2023-04-20 VITALS — BP 130/68 | HR 75 | Temp 98.3°F | Ht 67.0 in | Wt 169.4 lb

## 2023-04-20 DIAGNOSIS — R051 Acute cough: Secondary | ICD-10-CM | POA: Insufficient documentation

## 2023-04-20 DIAGNOSIS — R0602 Shortness of breath: Secondary | ICD-10-CM | POA: Diagnosis not present

## 2023-04-20 DIAGNOSIS — R059 Cough, unspecified: Secondary | ICD-10-CM | POA: Diagnosis not present

## 2023-04-20 DIAGNOSIS — Z45010 Encounter for checking and testing of cardiac pacemaker pulse generator [battery]: Secondary | ICD-10-CM | POA: Diagnosis not present

## 2023-04-20 DIAGNOSIS — I517 Cardiomegaly: Secondary | ICD-10-CM | POA: Diagnosis not present

## 2023-04-20 DIAGNOSIS — J45909 Unspecified asthma, uncomplicated: Secondary | ICD-10-CM | POA: Diagnosis not present

## 2023-04-20 LAB — POC COVID19 BINAXNOW: SARS Coronavirus 2 Ag: NEGATIVE

## 2023-04-20 MED ORDER — AZITHROMYCIN 250 MG PO TABS: ORAL_TABLET | ORAL | 0 refills | Status: DC

## 2023-04-20 MED ORDER — PREDNISONE 10 MG PO TABS: ORAL_TABLET | ORAL | 0 refills | Status: DC

## 2023-04-20 MED ORDER — ALBUTEROL SULFATE HFA 108 (90 BASE) MCG/ACT IN AERS: INHALATION_SPRAY | RESPIRATORY_TRACT | 11 refills | Status: DC

## 2023-04-20 NOTE — Patient Instructions (Signed)
Prednisone taper- start with 30 mg today and then taper down;  Nebulizer twice a day;  Add Mucinex twice a day;

## 2023-04-20 NOTE — Progress Notes (Signed)
[name] is a 86 y.o. female with the following history as recorded in [employer]:  Patient Active Problem List   Diagnosis Date Noted   CKD (chronic kidney disease) stage 4, GFR 15-29 ml/min (HCC) 06/27/2022   Mitral stenosis 04/17/2022   Acute kidney injury superimposed on chronic kidney disease (HCC) 01/02/2022   NSTEMI (non-ST elevated myocardial infarction) (HCC) 12/23/2021   Osteoarthritis of knee 03/30/2021   Bilateral hand pain 03/08/2021   Complete heart block (HCC) 12/13/2020   Pacemaker - MDT 12/13/2020   SOB (shortness of breath) 07/29/2020   Encntr for surgical aftcr following surgery on the circ sys 07/26/2020   Pain in right knee 03/17/2020   Chronic combined systolic (congestive) and diastolic (congestive) heart failure (HCC)    DKA (diabetic ketoacidoses) 12/20/2019   Diverticulosis 12/10/2019   Age-related osteoporosis without current pathological fracture 10/03/2019   Chronic kidney disease, stage 3 unspecified (HCC) 10/03/2019   Long term (current) use of insulin (HCC) 10/03/2019   Hyperlipidemia, unspecified 10/03/2019   Shortness of breath 01/30/2018   Acute bronchiolitis 12/17/2017   Wheezing 10/29/2017   Vitamin D deficiency 07/31/2017   Renal insufficiency 07/31/2017   Knee pain 06/01/2017   Morbid obesity due to excess calories (HCC) 01/25/2017   Diabetes (HCC) 04/12/2016   Cough 01/20/2015   Atrial fibrillation (HCC) 05/28/2014   Personal history of colonic polyps 05/28/2014   Vomiting 01/29/2014   CAD (coronary artery disease) 09/01/2013   Routine general medical examination at a health care facility 07/17/2011   Encounter for long-term (current) use of other medications 07/06/2011   Nonspecific (abnormal) findings on radiological and other examination of body structure 11/09/2009   IRRITABLE BOWEL SYNDROME 05/07/2009   CHEST PAIN 05/07/2009   ADNEXAL MASS, RIGHT 07/22/2008   HEMORRHOIDS, RECURRENT 01/27/2008   Diverticulitis 01/27/2008   OTH  ABNORMAL FIND RAD EXAMINATION BREAST 01/27/2008   GERD 01/13/2008   UTI 09/28/2007   Dyslipidemia 05/27/2007   ANEMIA-IRON DEFICIENCY 05/27/2007   ANXIETY 05/27/2007   Essential hypertension 05/27/2007   Seasonal allergic rhinitis 05/27/2007   Cough variant asthma 05/27/2007   Osteoporosis 05/27/2007   DIVERTICULITIS, HX OF 05/27/2007    Current Outpatient Medications  Medication Sig Dispense Refill   acetaminophen (TYLENOL) 325 MG tablet Take 650 mg by mouth every 6 (six) hours as needed for pain.     albuterol (PROVENTIL) (2.5 MG/3ML) 0.083% nebulizer solution Take 3 mLs (2.5 mg total) by nebulization every 6 (six) hours as needed for wheezing or shortness of breath. 150 mL 1   Ascorbic Acid (VITAMIN C) 1000 MG tablet Take 1,000 mg by mouth daily.     atorvastatin (LIPITOR) 80 MG tablet Take 1 tablet (80 mg total) by mouth daily. 90 tablet 3   azithromycin (ZITHROMAX Z-PAK) 250 MG tablet Take 2 tablets (500 mg) PO today, then 1 tablet (250 mg) PO daily x4 days. 6 tablet 0   BD VEO INSULIN SYRINGE U/F 31G X 15/64" 0.5 ML MISC USE AS INSTRUCTED 100 each 3   Blood Glucose Monitoring Suppl (ONETOUCH VERIO FLEX SYSTEM) w/Device KIT USE AS DIRECTED 1 kit .   budesonide-formoterol (SYMBICORT) 160-4.5 MCG/ACT inhaler TAKE 2 PUFFS FIRST THING IN AM AND THEN ANOTHER 2 PUFFS ABOUT 12 HOURS LATER. 30.6 each 6   carvedilol (COREG) 3.125 MG tablet Take 1 tablet (3.125 mg total) by mouth 2 (two) times daily with a meal. 180 tablet 1   cetirizine (ZYRTEC ALLERGY) 10 MG tablet Take 1 tablet (10 mg total)  by mouth daily as needed for allergies.     cholecalciferol (VITAMIN D3) 25 MCG (1000 UNIT) tablet Take 1,000 Units by mouth daily. Isn't taking regularly     clopidogrel (PLAVIX) 75 MG tablet TAKE 1 TABLET BY MOUTH DAILY WITH BREAKFAST. 90 tablet 2   diclofenac Sodium (VOLTAREN) 1 % GEL Apply 4 g topically 4 (four) times daily as needed (pain).     ELIQUIS 2.5 MG TABS tablet TAKE 1 TABLET BY MOUTH TWICE  A DAY 60 tablet 5   famotidine (PEPCID) 20 MG tablet Take 1 tablet (20 mg total) by mouth 2 (two) times daily. 180 tablet 1   FARXIGA 5 MG TABS tablet TAKE 1 TABLET BY MOUTH EVERY DAY BEFORE BREAKFAST 30 tablet 3   furosemide (LASIX) 40 MG tablet Take 1 tablet (40 mg total) by mouth 2 (two) times daily. 180 tablet 3   glucose blood (ONETOUCH VERIO) test strip 1 each by Other route 2 (two) times daily. And lancets 2/day 200 each 3   glucose blood test strip Check blood sugar twice a day 100 each 12   hydrALAZINE (APRESOLINE) 25 MG tablet Take 3 tablets (75 mg total) by mouth 3 (three) times daily. 270 tablet 8   insulin lispro (HUMALOG) 100 UNIT/ML injection Give 3 units with BREAKFAST, AND 18 units with SUPPER 60 mL 1   latanoprost (XALATAN) 0.005 % ophthalmic solution Place 1 drop into both eyes at bedtime.     montelukast (SINGULAIR) 10 MG tablet TAKE 1 TABLET BY MOUTH EVERYDAY AT BEDTIME 90 tablet 1   nitroGLYCERIN (NITROSTAT) 0.4 MG SL tablet Place 1 tablet (0.4 mg total) under the tongue every 5 (five) minutes x 3 doses as needed for chest pain. 25 tablet 2   ondansetron (ZOFRAN) 8 MG tablet Take 1 tablet (8 mg total) by mouth every 8 (eight) hours as needed for nausea or vomiting. 20 tablet 0   pantoprazole (PROTONIX) 40 MG tablet TAKE 1 TABLET BY MOUTH TWICE A DAY 180 tablet 3   potassium chloride (KLOR-CON) 10 MEQ tablet TAKE 1 TABLET BY MOUTH EVERY DAY 90 tablet 1   sodium bicarbonate 650 MG tablet Take 650 mg by mouth 2 (two) times daily.     Syringe/Needle, Disp, (SYRINGE 3CC/25GX1") 25G X 1" 3 ML MISC Use to inject into the skin 2x a day 100 each 3   vitamin B-12 (CYANOCOBALAMIN) 100 MCG tablet Take 100 mcg by mouth daily.     albuterol (PROAIR HFA) 108 (90 Base) MCG/ACT inhaler 2 puffs every 4 hours as needed only  if your can't catch your breath 18 g 11   predniSONE (DELTASONE) 10 MG tablet Take 4tabs x2days, 2tabs x2days, 1tab x2days, then stop 14 tablet 0   Current  Facility-Administered Medications  Medication Dose Route Frequency Provider Last Rate Last Admin   ipratropium-albuterol (DUONEB) 0.5-2.5 (3) MG/3ML nebulizer solution 3 mL  3 mL Nebulization Once Olive Bass, FNP        Allergies: Aspirin and Ramipril  Past Medical History:  Diagnosis Date   Allergic rhinitis    Anterior chest wall pain    Anxiety    Asthma    Chronic diastolic CHF (congestive heart failure) (HCC)    Echo 01/2020: EF 60-65, normal wall motion, mild LVH, normal RV SF, RVSP 52.6 (moderate elevation), severe LAE, moderate RAE, trivial MR, mild MS (mean gradient 5.5 mmHg), mild aortic valve sclerosis (no AS); elevated E/e' c/w elevated LVEDP   Cough  DM type 2 (diabetes mellitus, type 2) (HCC)    GERD (gastroesophageal reflux disease)    History of diverticulitis of colon    HTN (hypertension)    Hyperlipidemia    IBS (irritable bowel syndrome)    Iron deficiency anemia    Mitral stenosis 04/17/2022   Echo 04/2022: EF 55-60, no RWMA, mild LVH, normal RVSF, normal PASP, moderate LAE, moderate mitral stenosis (mean gradient 4 mmHg), trivial MR, trivial AI, AV sclerosis without stenosis   Osteoporosis, unspecified    Renal insufficiency 07/31/2017   UTI (urinary tract infection)     Past Surgical History:  Procedure Laterality Date   CARDIOVASCULAR STRESS TEST  02/25/04   CORONARY BALLOON ANGIOPLASTY N/A 12/28/2021   Procedure: CORONARY BALLOON ANGIOPLASTY;  Surgeon: Kathleene Hazel, MD;  Location: [facility];  Service: Cardiovascular;  Laterality: N/A;   CORONARY STENT INTERVENTION N/A 12/28/2021   Procedure: CORONARY STENT INTERVENTION;  Surgeon: Kathleene Hazel, MD;  Location: [facility];  Service: Cardiovascular;  Laterality: N/A;   ESOPHAGOGASTRODUODENOSCOPY  12/26/01   PACEMAKER IMPLANT N/A 08/02/2020   Procedure: PACEMAKER IMPLANT;  Surgeon: Duke Salvia, MD;  Location: [facility] INVASIVE CV [facility];  Service: Cardiovascular;   Laterality: N/A;   RIGHT OOPHORECTOMY  jan 2010   RIGHT/LEFT HEART CATH AND CORONARY ANGIOGRAPHY N/A 12/28/2021   Procedure: RIGHT/LEFT HEART CATH AND CORONARY ANGIOGRAPHY;  Surgeon: Kathleene Hazel, MD;  Location: [facility];  Service: Cardiovascular;  Laterality: N/A;    Family History  Problem Relation Age of Onset   Lung cancer Brother    Melanoma Brother    Hypertension Mother    Stroke Mother    Other Father        poor circulation   Cancer Sister    Diabetes Daughter    Atopy Neg Hx    Asthma Neg Hx    Breast cancer Neg Hx     Social History   Tobacco Use   Smoking status: Former    Current packs/day: 0.00    Average packs/day: 0.3 packs/day for 5.0 years (1.5 ttl pk-yrs)    Types: Cigarettes    Start date: 10/02/1981    Quit date: 10/02/1986    Years since quitting: 36.5   Smokeless tobacco: Never  Substance Use Topics   Alcohol use: No    Subjective:   2-3 day history of cough/ congestion; concerned for asthma flare; is using her Symbicort daily; did re-start prednisone yesterday; took course of prednisone at end of June and had been feeling better until Wednesday; "I got out in the heat."    Objective:  Vitals:   04/20/23 1015  BP: 130/68  Pulse: 75  Temp: 98.3 F (36.8 C)  TempSrc: Oral  SpO2: 99%  Weight: 169 lb 6.4 oz (76.8 kg)  Height: 5\' 7"  (1.702 m)    General: Well developed, well nourished, in no acute distress  Skin : Warm and dry.  Head: Normocephalic and atraumatic  Eyes: Sclera and conjunctiva clear; pupils round and reactive to light; extraocular movements intact  Ears: External normal; canals clear; tympanic membranes normal  Oropharynx: Pink, supple. No suspicious lesions  Neck: Supple without thyromegaly, adenopathy  Lungs: Respirations unlabored; coarse breath sounds;  CVS exam: normal rate and regular rhythm.  Neurologic: Alert and oriented; speech intact; face symmetrical; moves all extremities well; CNII-XII intact  without focal deficit   Assessment:  1. Acute cough   2. Acute asthmatic bronchitis  Plan:  Rapid COVID is negative; Repeat CXR today; Rx for Z-pak, prednisone and use nebulizer twice a day for the next few days; add Mucinex to help wih congestion; increase fluids, rest and keep planned follow up with her pulmonologist for next month;   No follow-ups on file.  Orders Placed This Encounter  Procedures   DG Chest 2 View    Standing Status:   Future    Number of Occurrences:   1    Standing Expiration Date:   04/19/2024    Order Specific Question:   Reason for Exam (SYMPTOM  OR DIAGNOSIS REQUIRED)    Answer:   persistent cough    Order Specific Question:   Preferred imaging location?    Answer:   [facility]   POC COVID-19    Order Specific Question:   Previously tested for COVID-19    Answer:   No    Order Specific Question:   Resident in a congregate (group) care setting    Answer:   No    Order Specific Question:   Employed in healthcare setting    Answer:   No    Order Specific Question:   Pregnant    Answer:   No    Requested Prescriptions   Signed Prescriptions Disp Refills   azithromycin (ZITHROMAX Z-PAK) 250 MG tablet 6 tablet 0    Sig: Take 2 tablets (500 mg) PO today, then 1 tablet (250 mg) PO daily x4 days.   predniSONE (DELTASONE) 10 MG tablet 14 tablet 0    Sig: Take 4tabs x2days, 2tabs x2days, 1tab x2days, then stop   albuterol (PROAIR HFA) 108 (90 Base) MCG/ACT inhaler 18 g 11    Sig: 2 puffs every 4 hours as needed only  if your can't catch your breath

## 2023-04-27 ENCOUNTER — Encounter: Payer: Self-pay | Admitting: Family

## 2023-04-27 ENCOUNTER — Telehealth: Payer: Self-pay | Admitting: Family

## 2023-04-27 ENCOUNTER — Emergency Department (HOSPITAL_COMMUNITY)
Admission: EM | Admit: 2023-04-27 | Discharge: 2023-04-27 | Disposition: A | Discharge status: Home / Self Care | Payer: 59 | Attending: Emergency Medicine | Admitting: Emergency Medicine

## 2023-04-27 ENCOUNTER — Emergency Department (HOSPITAL_COMMUNITY): Payer: 59

## 2023-04-27 ENCOUNTER — Encounter (HOSPITAL_COMMUNITY): Payer: Self-pay | Admitting: Emergency Medicine

## 2023-04-27 ENCOUNTER — Other Ambulatory Visit: Payer: Self-pay

## 2023-04-27 DIAGNOSIS — E119 Type 2 diabetes mellitus without complications: Secondary | ICD-10-CM | POA: Insufficient documentation

## 2023-04-27 DIAGNOSIS — Z7901 Long term (current) use of anticoagulants: Secondary | ICD-10-CM | POA: Diagnosis not present

## 2023-04-27 DIAGNOSIS — Z7984 Long term (current) use of oral hypoglycemic drugs: Secondary | ICD-10-CM | POA: Insufficient documentation

## 2023-04-27 DIAGNOSIS — Z79899 Other long term (current) drug therapy: Secondary | ICD-10-CM | POA: Diagnosis not present

## 2023-04-27 DIAGNOSIS — Z794 Long term (current) use of insulin: Secondary | ICD-10-CM | POA: Insufficient documentation

## 2023-04-27 DIAGNOSIS — R0602 Shortness of breath: Secondary | ICD-10-CM | POA: Diagnosis present

## 2023-04-27 DIAGNOSIS — I700 Atherosclerosis of aorta: Secondary | ICD-10-CM | POA: Diagnosis not present

## 2023-04-27 DIAGNOSIS — I110 Hypertensive heart disease with heart failure: Secondary | ICD-10-CM | POA: Diagnosis not present

## 2023-04-27 DIAGNOSIS — J45901 Unspecified asthma with (acute) exacerbation: Secondary | ICD-10-CM | POA: Insufficient documentation

## 2023-04-27 DIAGNOSIS — I5032 Chronic diastolic (congestive) heart failure: Secondary | ICD-10-CM | POA: Diagnosis not present

## 2023-04-27 LAB — CBC WITH DIFFERENTIAL/PLATELET
Abs Immature Granulocytes: 0.18 10*3/uL — ABNORMAL HIGH (ref 0.00–0.07)
Basophils Absolute: 0 10*3/uL (ref 0.0–0.1)
Basophils Relative: 0 %
Eosinophils Absolute: 0 10*3/uL (ref 0.0–0.5)
Eosinophils Relative: 0 %
HCT: 42.4 % (ref 36.0–46.0)
Hemoglobin: 14.7 g/dL (ref 12.0–15.0)
Immature Granulocytes: 2 %
Lymphocytes Relative: 4 %
Lymphs Abs: 0.4 10*3/uL — ABNORMAL LOW (ref 0.7–4.0)
MCH: 27.4 pg (ref 26.0–34.0)
MCHC: 34.7 g/dL (ref 30.0–36.0)
MCV: 79.1 fL — ABNORMAL LOW (ref 80.0–100.0)
Monocytes Absolute: 0.3 10*3/uL (ref 0.1–1.0)
Monocytes Relative: 2 %
Neutro Abs: 10 10*3/uL — ABNORMAL HIGH (ref 1.7–7.7)
Neutrophils Relative %: 92 %
Platelets: 161 10*3/uL (ref 150–400)
RBC: 5.36 MIL/uL — ABNORMAL HIGH (ref 3.87–5.11)
RDW: 17.2 % — ABNORMAL HIGH (ref 11.5–15.5)
WBC: 10.9 10*3/uL — ABNORMAL HIGH (ref 4.0–10.5)
nRBC: 0.2 % (ref 0.0–0.2)

## 2023-04-27 LAB — BASIC METABOLIC PANEL
Anion gap: 12 (ref 5–15)
BUN: 55 mg/dL — ABNORMAL HIGH (ref 8–23)
CO2: 19 mmol/L — ABNORMAL LOW (ref 22–32)
Calcium: 9.2 mg/dL (ref 8.9–10.3)
Chloride: 102 mmol/L (ref 98–111)
Creatinine, Ser: 2.48 mg/dL — ABNORMAL HIGH (ref 0.44–1.00)
GFR, Estimated: 19 mL/min — ABNORMAL LOW (ref >60)
Glucose, Bld: 271 mg/dL — ABNORMAL HIGH (ref 70–99)
Potassium: 5.4 mmol/L — ABNORMAL HIGH (ref 3.5–5.1)
Sodium: 133 mmol/L — ABNORMAL LOW (ref 135–145)

## 2023-04-27 LAB — BRAIN NATRIURETIC PEPTIDE: B Natriuretic Peptide: 138.2 pg/mL — ABNORMAL HIGH (ref 0.0–100.0)

## 2023-04-27 MED ORDER — METHYLPREDNISOLONE SODIUM SUCC 125 MG IJ SOLR
125.0000 mg | INTRAMUSCULAR | Status: AC
  Administered 2023-04-27: 125 mg via INTRAVENOUS
  Filled 2023-04-27: qty 2

## 2023-04-27 MED ORDER — IPRATROPIUM-ALBUTEROL 0.5-2.5 (3) MG/3ML IN SOLN
3.0000 mL | Freq: Once | RESPIRATORY_TRACT | Status: AC
  Administered 2023-04-27: 3 mL via RESPIRATORY_TRACT
  Filled 2023-04-27: qty 3

## 2023-04-27 MED ORDER — GUAIFENESIN 100 MG/5ML PO LIQD
10.0000 mL | Freq: Once | ORAL | Status: AC
  Administered 2023-04-27: 10 mL via ORAL
  Filled 2023-04-27: qty 10

## 2023-04-27 MED ORDER — PREDNISONE 20 MG PO TABS: 40.0000 mg | ORAL_TABLET | Freq: Every day | ORAL | 0 refills | Status: DC

## 2023-04-27 NOTE — ED Triage Notes (Signed)
Pt presents with shob since Wednesday.  Has hx of asthma. Nebs and inhaler at home not working.  No reported fevers.

## 2023-04-27 NOTE — Telephone Encounter (Signed)
Pt & pt daughter just called and requested to speak with you to discuss several questions they have. Please advise pt.

## 2023-04-27 NOTE — Discharge Instructions (Signed)
It was a pleasure taking part in your care today.  As we discussed, your lab work and CT scan of your chest were reassuring.  We gave you duo nebulizer and steroids and you stated you felt much better afterwards.  I am sending you home on 4 days of steroids which she will begin taking tomorrow, 7/27.  You will take 40 mg of prednisone for the next 4 days beginning on 7/27.  Please continue utilizing duo nebulizers at home as needed.  Please have the patient follow-up on Monday with her PCP for reevaluation.  If the patient begins to feel more short of breath, lightheaded, dizzy or weak over the weekend, please have the patient return to the ED for further management.

## 2023-04-27 NOTE — ED Provider Triage Note (Signed)
Emergency Medicine Provider Triage Evaluation Note  [name] , a 87 y.o. female  was evaluated in triage.  Pt complains of this of breath.  She has history of asthma.  She has had a severe nonproductive cough for more than a week.  She saw her PCP and had an outpatient COVID test that was negativ she had a chest x-ray however it was never read.  I called the reading room to add this to the queue.  She complains of significant shortness of breath and flulike symptoms. Review of Systems  Positive: Cough and shortness of breath Negative:  Physical Exam  BP 122/68 (BP Location: Left Arm)   Pulse 61   Temp 99 F (37.2 C) (Oral)   Resp 18   SpO2 99%  Gen:   Awake, no distress f Resp:  Tachypnea, poor air movement MSK:   Moves extremities without difficulty  Other:    Medical Decision Making  Medically screening exam initiated at 7:03 PM.  Appropriate orders placed.  [name] was informed that the remainder of the evaluation will be completed by another provider, this initial triage assessment does not replace that evaluation, and the importance of remaining in the ED until their evaluation is complete.     Arthor Captain, PA-C 04/27/23 1905

## 2023-04-27 NOTE — Telephone Encounter (Signed)
Pt's daughter just wanted pcp to be aware they have still not received results on recent imaging.

## 2023-04-27 NOTE — Telephone Encounter (Signed)
PCP has been made aware.  

## 2023-04-27 NOTE — Telephone Encounter (Signed)
I spoke to patient's daughter to let her know that we did not have CXR results from last week. Daughter notes that her mother had been doing better up until today and then she started wheezing again. They were actually on the way to the ER at time of phone call which I agreed was appropriate and we will check on her on Monday;

## 2023-04-27 NOTE — ED Provider Notes (Signed)
[place] EMERGENCY DEPARTMENT AT [facility] Provider Note   CSN: [id number] Arrival date & time: 04/27/23  1755     History  Chief Complaint  Patient presents with   Shortness of Breath    [name] is a 87 y.o. female with medical history of chronic diastolic CHF, cough, diabetes type 2, GERD, hypertension, IBS, iron deficiency anemia, asthma.  Patient presents to ED for evaluation of shortness of breath and wheezing.  Patient reports that sometime last week she developed shortness of breath and cough.  She reports that she was seen at her PCPs office where they did a chest x-ray which was unremarkable.  Patient states that ever since this time her shortness of breath is increased.  She reports that on Wednesday her shortness of breath acutely worsened.  She states that she has had persistent wheezing since this time.  She reports that she has tried 1 duo nebulizer a day at home since onset on Wednesday but nothing is relieving her shortness of breath.  She denies fevers, chest pain, nausea, vomiting, lightheadedness, dizziness, weakness, leg swelling.   Shortness of Breath Associated symptoms: wheezing   Associated symptoms: no chest pain, no fever and no vomiting        Home Medications Prior to Admission medications   Medication Sig Start Date End Date Taking? Authorizing Provider  acetaminophen (TYLENOL) 325 MG tablet Take 650 mg by mouth every 6 (six) hours as needed for pain.    [provider]  albuterol (PROAIR HFA) 108 (90 Base) MCG/ACT inhaler 2 puffs every 4 hours as needed only  if your can't catch your breath 04/20/23   Olive Bass, FNP  albuterol (PROVENTIL) (2.5 MG/3ML) 0.083% nebulizer solution Take 3 mLs (2.5 mg total) by nebulization every 6 (six) hours as needed for wheezing or shortness of breath. 03/06/23   Olive Bass, FNP  Ascorbic Acid (VITAMIN C) 1000 MG tablet Take 1,000 mg by mouth daily.    [provider]   atorvastatin (LIPITOR) 80 MG tablet Take 1 tablet (80 mg total) by mouth daily. 04/03/22   Dyann Kief, PA-C  azithromycin (ZITHROMAX Z-PAK) 250 MG tablet Take 2 tablets (500 mg) PO today, then 1 tablet (250 mg) PO daily x4 days. 04/20/23   Olive Bass, FNP  BD VEO INSULIN SYRINGE U/F 31G X 15/64" 0.5 ML MISC USE AS INSTRUCTED 04/10/23   Reather Littler, MD  Blood Glucose Monitoring Suppl [facility] VERIO FLEX SYSTEM) w/Device KIT USE AS DIRECTED 01/16/22   Romero Belling, MD  budesonide-formoterol (SYMBICORT) 160-4.5 MCG/ACT inhaler TAKE 2 PUFFS FIRST THING IN AM AND THEN ANOTHER 2 PUFFS ABOUT 12 HOURS LATER. 10/06/22   Nyoka Cowden, MD  carvedilol (COREG) 3.125 MG tablet Take 1 tablet (3.125 mg total) by mouth 2 (two) times daily with a meal. 04/10/23   Pricilla Riffle, MD  cetirizine (ZYRTEC ALLERGY) 10 MG tablet Take 1 tablet (10 mg total) by mouth daily as needed for allergies. 05/04/20   Nyoka Cowden, MD  cholecalciferol (VITAMIN D3) 25 MCG (1000 UNIT) tablet Take 1,000 Units by mouth daily. Isn't taking regularly    [provider]  clopidogrel (PLAVIX) 75 MG tablet TAKE 1 TABLET BY MOUTH DAILY WITH BREAKFAST. 01/05/23   Dyann Kief, PA-C  diclofenac Sodium (VOLTAREN) 1 % GEL Apply 4 g topically 4 (four) times daily as needed (pain). 01/27/21   [provider]  ELIQUIS 2.5 MG TABS tablet TAKE  1 TABLET BY MOUTH TWICE A DAY 01/08/23   Pricilla Riffle, MD  famotidine (PEPCID) 20 MG tablet Take 1 tablet (20 mg total) by mouth 2 (two) times daily. 12/25/22   Olive Bass, FNP  FARXIGA 5 MG TABS tablet TAKE 1 TABLET BY MOUTH EVERY DAY BEFORE BREAKFAST 02/28/23   Altamese [place], MD  furosemide (LASIX) 40 MG tablet Take 1 tablet (40 mg total) by mouth 2 (two) times daily. 10/25/22   Pricilla Riffle, MD  glucose blood [facility] VERIO) test strip 1 each by Other route 2 (two) times daily. And lancets 2/day 12/13/21   Romero Belling, MD  glucose blood test strip Check blood sugar  twice a day 04/11/21   Romero Belling, MD  hydrALAZINE (APRESOLINE) 25 MG tablet Take 3 tablets (75 mg total) by mouth 3 (three) times daily. 08/14/22   Pricilla Riffle, MD  insulin lispro (HUMALOG) 100 UNIT/ML injection Give 3 units with BREAKFAST, AND 18 units with SUPPER 04/11/23   Motwani, Komal, MD  latanoprost (XALATAN) 0.005 % ophthalmic solution Place 1 drop into both eyes at bedtime. 03/16/21   [provider]  montelukast (SINGULAIR) 10 MG tablet TAKE 1 TABLET BY MOUTH EVERYDAY AT BEDTIME 01/26/23   Nyoka Cowden, MD  nitroGLYCERIN (NITROSTAT) 0.4 MG SL tablet Place 1 tablet (0.4 mg total) under the tongue every 5 (five) minutes x 3 doses as needed for chest pain. 01/04/22   Arty Baumgartner, NP  ondansetron (ZOFRAN) 8 MG tablet Take 1 tablet (8 mg total) by mouth every 8 (eight) hours as needed for nausea or vomiting. 01/12/23   Olive Bass, FNP  pantoprazole (PROTONIX) 40 MG tablet TAKE 1 TABLET BY MOUTH TWICE A DAY 03/23/23   Dyann Kief, PA-C  potassium chloride (KLOR-CON) 10 MEQ tablet TAKE 1 TABLET BY MOUTH EVERY DAY 01/01/23   Pricilla Riffle, MD  predniSONE (DELTASONE) 20 MG tablet Take 2 tablets (40 mg total) by mouth daily. 04/27/23  Yes Al Decant, PA-C  sodium bicarbonate 650 MG tablet Take 650 mg by mouth 2 (two) times daily. 05/19/22   [provider]  Syringe/Needle, Disp, (SYRINGE 3CC/25GX1") 25G X 1" 3 ML MISC Use to inject into the skin 2x a day 04/11/21   Romero Belling, MD  vitamin B-12 (CYANOCOBALAMIN) 100 MCG tablet Take 100 mcg by mouth daily.    [provider]      Allergies    Aspirin and Ramipril    Review of Systems   Review of Systems  Constitutional:  Negative for fever.  Respiratory:  Positive for shortness of breath and wheezing.   Cardiovascular:  Negative for chest pain and leg swelling.  Gastrointestinal:  Negative for nausea and vomiting.  Neurological:  Negative for dizziness, syncope, weakness and  light-headedness.  All other systems reviewed and are negative.   Physical Exam Updated Vital Signs BP 118/71   Pulse 60   Temp 99 F (37.2 C) (Oral)   Resp (!) 22   SpO2 100%  Physical Exam Vitals and nursing note reviewed.  Constitutional:      General: She is not in acute distress.    Appearance: Normal appearance. She is not ill-appearing, toxic-appearing or diaphoretic.  HENT:     Head: Normocephalic and atraumatic.     Nose: Nose normal.     Mouth/Throat:     Mouth: Mucous membranes are moist.     Pharynx: Oropharynx is clear.  Eyes:  Extraocular Movements: Extraocular movements intact.     Conjunctiva/sclera: Conjunctivae normal.     Pupils: Pupils are equal, round, and reactive to light.  Cardiovascular:     Rate and Rhythm: Normal rate and regular rhythm.  Pulmonary:     Effort: Pulmonary effort is normal.     Breath sounds: Wheezing present.  Abdominal:     General: Abdomen is flat. Bowel sounds are normal.     Palpations: Abdomen is soft.     Tenderness: There is no abdominal tenderness.  Musculoskeletal:     Cervical back: Normal range of motion and neck supple. No tenderness.  Skin:    General: Skin is warm and dry.     Capillary Refill: Capillary refill takes less than 2 seconds.  Neurological:     Mental Status: She is alert and oriented to person, place, and time.     ED Results / Procedures / Treatments   Labs (all labs ordered are listed, but only abnormal results are displayed) Labs Reviewed  BASIC METABOLIC PANEL - Abnormal; Notable for the following components:      Result Value   Sodium 133 (*)    Potassium 5.4 (*)    CO2 19 (*)    Glucose, Bld 271 (*)    BUN 55 (*)    Creatinine, Ser 2.48 (*)    GFR, Estimated 19 (*)    All other components within normal limits  CBC WITH DIFFERENTIAL/PLATELET - Abnormal; Notable for the following components:   WBC 10.9 (*)    RBC 5.36 (*)    MCV 79.1 (*)    RDW 17.2 (*)    Neutro Abs 10.0 (*)     Lymphs Abs 0.4 (*)    Abs Immature Granulocytes 0.18 (*)    All other components within normal limits  BRAIN NATRIURETIC PEPTIDE - Abnormal; Notable for the following components:   B Natriuretic Peptide 138.2 (*)    All other components within normal limits    EKG EKG Interpretation Date/Time:  Friday April 27 2023 18:12:51 EDT Ventricular Rate:  72 PR Interval:    QRS Duration:  130 QT Interval:  438 QTC Calculation: 479 R Axis:   -15  Text Interpretation: Ventricular-paced rhythm Abnormal ECG When compared with ECG of 23-Jan-2022 18:12, PREVIOUS ECG IS PRESENT Confirmed by Glyn Ade [phone]) on 04/27/2023 9:50:25 PM  Radiology CT Chest Wo Contrast  Result Date: 04/27/2023 CLINICAL DATA:  Respiratory illness, nondiagnostic x-ray EXAM: CT CHEST WITHOUT CONTRAST TECHNIQUE: Multidetector CT imaging of the chest was performed following the standard protocol without IV contrast. RADIATION DOSE REDUCTION: This exam was performed according to the departmental dose-optimization program which includes automated exposure control, adjustment of the mA and/or kV according to patient size and/or use of iterative reconstruction technique. COMPARISON:  Radiographs 04/20/2023 and CTA chest 12/30/2019 FINDINGS: Cardiovascular: Left chest wall pacemaker. Mitral annular and aortic valve calcification. Coronary artery and aortic atherosclerotic calcification. No pericardial effusion. Mediastinum/Nodes: Small hiatal hernia. Mild wall thickening of the lower esophagus. Trachea is unremarkable. No thoracic adenopathy. Lungs/Pleura: Mild basilar atelectasis. Bronchial wall thickening and mucous plugging in the lower lobes greatest in the left lower lobe. No pleural effusion or pneumothorax. Upper Abdomen: Cholelithiasis.  No acute abnormality. Musculoskeletal: No acute fracture. IMPRESSION: 1. Bronchial wall thickening and mucous plugging in the lower lobes greatest in the left lower lobe. 2. Small hiatal  hernia with mild wall thickening of the lower esophagus suggesting esophagitis. 3. Cholelithiasis. Aortic Atherosclerosis (ICD10-I70.0). Electronically Signed  By: Minerva Fester M.D.   On: 04/27/2023 20:56    Procedures Procedures   Medications Ordered in ED Medications  ipratropium-albuterol (DUONEB) 0.5-2.5 (3) MG/3ML nebulizer solution 3 mL (3 mLs Nebulization Given 04/27/23 1954)  methylPREDNISolone sodium succinate (SOLU-MEDROL) 125 mg/2 mL injection 125 mg (125 mg Intravenous Given 04/27/23 2000)  ipratropium-albuterol (DUONEB) 0.5-2.5 (3) MG/3ML nebulizer solution 3 mL (3 mLs Nebulization Given 04/27/23 2120)  guaiFENesin (ROBITUSSIN) 100 MG/5ML liquid 10 mL (10 mLs Oral Given 04/27/23 2136)    ED Course/ Medical Decision Making/ A&P Clinical Course as of 04/27/23 2246  Fri Apr 27, 2023  2149 Stable SOB. COPD/Asthma being treated and discharge. [CC]    Clinical Course User Index [CC] Glyn Ade, MD   Medical Decision Making Risk OTC drugs. Prescription drug management.   87 year old female presents to the ED for evaluation.  Please see HPI for further details.  On examination patient is afebrile and nontachycardic.  Lung sounds have wheezing throughout, she is not a toxic however.  Abdomen is soft and compressible throughout.  Neurological examination is at baseline.  No peripheral edema to bilateral lower extremities.  She is overall nontoxic in appearance.  Patient CBC with slight leukocytosis 10.9, no anemia.  Patient metabolic panel shows sodium 133, potassium 5.4, glucose 271, creatinine 2.48, GFR 19, anion gap 12.  Patient potassium result could possibly be hemolyzed, her EKG does not show any peaked T waves.  Her creatinine is at baseline.  The patient BNP is slightly elevated 138.2 however patient not in acute CHF exacerbation, not volume overloaded.  Patient CT chest without contrast shows bronchial wall thickening and mucous plugging in the lower lobes greater  in the left lower lobe, there is also small hiatal hernia and esophagitis.  The patient daughter reports that the patient is compliant on her 40 mg Protonix at home.  Patient given 2 DuoNebs, 125 Solu-Medrol, 10 mL of potassium.  On reassessment, the patient reports she feels much better.  At this time the patient be discharged home.  She will follow-up with her PCP on Monday.  She and her daughter was advised that if the patient begins to feel poorly over the weekend she should return to the ED for further management.  The patient will be sent home with 40 mg of steroids which she will take for the next 4 days beginning tomorrow on 7/27.  Return precautions provided and the patient voiced understanding.  The patient all her questions answered to her satisfaction.  She is stable to discharge at this time.   Final Clinical Impression(s) / ED Diagnoses Final diagnoses:  Exacerbation of asthma, unspecified asthma severity, unspecified whether persistent    Rx / DC Orders ED Discharge Orders          Ordered    predniSONE (DELTASONE) 20 MG tablet  Daily        04/27/23 2237              Al Decant, PA-C 04/27/23 2246    Glyn Ade, MD 04/27/23 2320

## 2023-04-30 ENCOUNTER — Ambulatory Visit (INDEPENDENT_AMBULATORY_CARE_PROVIDER_SITE_OTHER): Payer: 59

## 2023-04-30 DIAGNOSIS — I442 Atrioventricular block, complete: Secondary | ICD-10-CM | POA: Diagnosis not present

## 2023-04-30 DIAGNOSIS — I4891 Unspecified atrial fibrillation: Secondary | ICD-10-CM

## 2023-05-01 ENCOUNTER — Encounter: Payer: Self-pay | Admitting: Family

## 2023-05-01 ENCOUNTER — Ambulatory Visit (INDEPENDENT_AMBULATORY_CARE_PROVIDER_SITE_OTHER): Payer: 59 | Admitting: Family

## 2023-05-01 VITALS — BP 136/74 | HR 76 | Ht 67.0 in | Wt 168.2 lb

## 2023-05-01 DIAGNOSIS — J45901 Unspecified asthma with (acute) exacerbation: Secondary | ICD-10-CM | POA: Diagnosis not present

## 2023-05-01 DIAGNOSIS — R5383 Other fatigue: Secondary | ICD-10-CM

## 2023-05-01 LAB — CBC WITH DIFFERENTIAL/PLATELET
Basophils Absolute: 0 10*3/uL (ref 0.0–0.1)
Basophils Relative: 0.2 % (ref 0.0–3.0)
Eosinophils Absolute: 0 10*3/uL (ref 0.0–0.7)
Eosinophils Relative: 0.1 % (ref 0.0–5.0)
HCT: 43.2 % (ref 36.0–46.0)
Hemoglobin: 14.3 g/dL (ref 12.0–15.0)
Lymphocytes Relative: 9.4 % — ABNORMAL LOW (ref 12.0–46.0)
Lymphs Abs: 1 10*3/uL (ref 0.7–4.0)
MCHC: 33.2 g/dL (ref 30.0–36.0)
MCV: 82.9 fl (ref 78.0–100.0)
Monocytes Absolute: 0.8 10*3/uL (ref 0.1–1.0)
Monocytes Relative: 7.4 % (ref 3.0–12.0)
Neutro Abs: 9.1 10*3/uL — ABNORMAL HIGH (ref 1.4–7.7)
Neutrophils Relative %: 82.9 % — ABNORMAL HIGH (ref 43.0–77.0)
Platelets: 133 10*3/uL — ABNORMAL LOW (ref 150.0–400.0)
RBC: 5.22 Mil/uL — ABNORMAL HIGH (ref 3.87–5.11)
RDW: 17.2 % — ABNORMAL HIGH (ref 11.5–15.5)
WBC: 11 10*3/uL — ABNORMAL HIGH (ref 4.0–10.5)

## 2023-05-01 LAB — COMPREHENSIVE METABOLIC PANEL
ALT: 35 U/L (ref 0–35)
AST: 21 U/L (ref 0–37)
Albumin: 3.9 g/dL (ref 3.5–5.2)
Alkaline Phosphatase: 102 U/L (ref 39–117)
BUN: 66 mg/dL — ABNORMAL HIGH (ref 6–23)
CO2: 25 mEq/L (ref 19–32)
Calcium: 9.5 mg/dL (ref 8.4–10.5)
Chloride: 96 mEq/L (ref 96–112)
Creatinine, Ser: 2.74 mg/dL — ABNORMAL HIGH (ref 0.40–1.20)
GFR: 15.38 mL/min — ABNORMAL LOW (ref >60.00)
Glucose, Bld: 471 mg/dL — ABNORMAL HIGH (ref 70–99)
Potassium: 4.6 mEq/L (ref 3.5–5.1)
Sodium: 133 mEq/L — ABNORMAL LOW (ref 135–145)
Total Bilirubin: 0.8 mg/dL (ref 0.2–1.2)
Total Protein: 6.7 g/dL (ref 6.0–8.3)

## 2023-05-01 NOTE — Progress Notes (Signed)
SYNAI MAGNER is a 87 y.o. female with the following history as recorded in EpicCare:  Patient Active Problem List   Diagnosis Date Noted   CKD (chronic kidney disease) stage 4, GFR 15-29 ml/min (HCC) 06/27/2022   Mitral stenosis 04/17/2022   Acute kidney injury superimposed on chronic kidney disease (HCC) 01/02/2022   NSTEMI (non-ST elevated myocardial infarction) (HCC) 12/23/2021   Osteoarthritis of knee 03/30/2021   Bilateral hand pain 03/08/2021   Complete heart block (HCC) 12/13/2020   Pacemaker - MDT 12/13/2020   SOB (shortness of breath) 07/29/2020   Encntr for surgical aftcr following surgery on the circ sys 07/26/2020   Pain in right knee 03/17/2020   Chronic combined systolic (congestive) and diastolic (congestive) heart failure (HCC)    DKA (diabetic ketoacidoses) 12/20/2019   Diverticulosis 12/10/2019   Age-related osteoporosis without current pathological fracture 10/03/2019   Chronic kidney disease, stage 3 unspecified (HCC) 10/03/2019   Long term (current) use of insulin (HCC) 10/03/2019   Hyperlipidemia, unspecified 10/03/2019   Shortness of breath 01/30/2018   Acute bronchiolitis 12/17/2017   Wheezing 10/29/2017   Vitamin D deficiency 07/31/2017   Renal insufficiency 07/31/2017   Knee pain 06/01/2017   Morbid obesity due to excess calories (HCC) 01/25/2017   Diabetes (HCC) 04/12/2016   Cough 01/20/2015   Atrial fibrillation (HCC) 05/28/2014   Personal history of colonic polyps 05/28/2014   Vomiting 01/29/2014   CAD (coronary artery disease) 09/01/2013   Routine general medical examination at a health care facility 07/17/2011   Encounter for long-term (current) use of other medications 07/06/2011   Nonspecific (abnormal) findings on radiological and other examination of body structure 11/09/2009   IRRITABLE BOWEL SYNDROME 05/07/2009   CHEST PAIN 05/07/2009   ADNEXAL MASS, RIGHT 07/22/2008   HEMORRHOIDS, RECURRENT 01/27/2008   Diverticulitis 01/27/2008   OTH  ABNORMAL FIND RAD EXAMINATION BREAST 01/27/2008   GERD 01/13/2008   UTI 09/28/2007   Dyslipidemia 05/27/2007   ANEMIA-IRON DEFICIENCY 05/27/2007   ANXIETY 05/27/2007   Essential hypertension 05/27/2007   Seasonal allergic rhinitis 05/27/2007   Cough variant asthma 05/27/2007   Osteoporosis 05/27/2007   DIVERTICULITIS, HX OF 05/27/2007    Current Outpatient Medications  Medication Sig Dispense Refill   acetaminophen (TYLENOL) 325 MG tablet Take 650 mg by mouth every 6 (six) hours as needed for pain.     albuterol (PROAIR HFA) 108 (90 Base) MCG/ACT inhaler 2 puffs every 4 hours as needed only  if your can't catch your breath 18 g 11   albuterol (PROVENTIL) (2.5 MG/3ML) 0.083% nebulizer solution Take 3 mLs (2.5 mg total) by nebulization every 6 (six) hours as needed for wheezing or shortness of breath. 150 mL 1   Ascorbic Acid (VITAMIN C) 1000 MG tablet Take 1,000 mg by mouth daily.     atorvastatin (LIPITOR) 80 MG tablet Take 1 tablet (80 mg total) by mouth daily. 90 tablet 3   azithromycin (ZITHROMAX Z-PAK) 250 MG tablet Take 2 tablets (500 mg) PO today, then 1 tablet (250 mg) PO daily x4 days. 6 tablet 0   BD VEO INSULIN SYRINGE U/F 31G X 15/64" 0.5 ML MISC USE AS INSTRUCTED 100 each 3   Blood Glucose Monitoring Suppl (ONETOUCH VERIO FLEX SYSTEM) w/Device KIT USE AS DIRECTED 1 kit .   budesonide-formoterol (SYMBICORT) 160-4.5 MCG/ACT inhaler TAKE 2 PUFFS FIRST THING IN AM AND THEN ANOTHER 2 PUFFS ABOUT 12 HOURS LATER. 30.6 each 6   carvedilol (COREG) 3.125 MG tablet Take 1 tablet (3.125  mg total) by mouth 2 (two) times daily with a meal. 180 tablet 1   cetirizine (ZYRTEC ALLERGY) 10 MG tablet Take 1 tablet (10 mg total) by mouth daily as needed for allergies.     cholecalciferol (VITAMIN D3) 25 MCG (1000 UNIT) tablet Take 1,000 Units by mouth daily. Isn't taking regularly     clopidogrel (PLAVIX) 75 MG tablet TAKE 1 TABLET BY MOUTH DAILY WITH BREAKFAST. 90 tablet 2   diclofenac Sodium  (VOLTAREN) 1 % GEL Apply 4 g topically 4 (four) times daily as needed (pain).     ELIQUIS 2.5 MG TABS tablet TAKE 1 TABLET BY MOUTH TWICE A DAY 60 tablet 5   famotidine (PEPCID) 20 MG tablet Take 1 tablet (20 mg total) by mouth 2 (two) times daily. 180 tablet 1   FARXIGA 5 MG TABS tablet TAKE 1 TABLET BY MOUTH EVERY DAY BEFORE BREAKFAST 30 tablet 3   furosemide (LASIX) 40 MG tablet Take 1 tablet (40 mg total) by mouth 2 (two) times daily. 180 tablet 3   glucose blood (ONETOUCH VERIO) test strip 1 each by Other route 2 (two) times daily. And lancets 2/day 200 each 3   glucose blood test strip Check blood sugar twice a day 100 each 12   hydrALAZINE (APRESOLINE) 25 MG tablet Take 3 tablets (75 mg total) by mouth 3 (three) times daily. 270 tablet 8   insulin lispro (HUMALOG) 100 UNIT/ML injection Give 3 units with BREAKFAST, AND 18 units with SUPPER 60 mL 1   latanoprost (XALATAN) 0.005 % ophthalmic solution Place 1 drop into both eyes at bedtime.     montelukast (SINGULAIR) 10 MG tablet TAKE 1 TABLET BY MOUTH EVERYDAY AT BEDTIME 90 tablet 1   nitroGLYCERIN (NITROSTAT) 0.4 MG SL tablet Place 1 tablet (0.4 mg total) under the tongue every 5 (five) minutes x 3 doses as needed for chest pain. 25 tablet 2   ondansetron (ZOFRAN) 8 MG tablet Take 1 tablet (8 mg total) by mouth every 8 (eight) hours as needed for nausea or vomiting. 20 tablet 0   pantoprazole (PROTONIX) 40 MG tablet TAKE 1 TABLET BY MOUTH TWICE A DAY 180 tablet 3   potassium chloride (KLOR-CON) 10 MEQ tablet TAKE 1 TABLET BY MOUTH EVERY DAY 90 tablet 1   predniSONE (DELTASONE) 20 MG tablet Take 2 tablets (40 mg total) by mouth daily. 8 tablet 0   sodium bicarbonate 650 MG tablet Take 650 mg by mouth 2 (two) times daily.     Syringe/Needle, Disp, (SYRINGE 3CC/25GX1") 25G X 1" 3 ML MISC Use to inject into the skin 2x a day 100 each 3   vitamin B-12 (CYANOCOBALAMIN) 100 MCG tablet Take 100 mcg by mouth daily.     Current Facility-Administered  Medications  Medication Dose Route Frequency Provider Last Rate Last Admin   ipratropium-albuterol (DUONEB) 0.5-2.5 (3) MG/3ML nebulizer solution 3 mL  3 mL Nebulization Once Olive Bass, FNP        Allergies: Aspirin and Ramipril  Past Medical History:  Diagnosis Date   Allergic rhinitis    Anterior chest wall pain    Anxiety    Asthma    Chronic diastolic CHF (congestive heart failure) (HCC)    Echo 01/2020: EF 60-65, normal wall motion, mild LVH, normal RV SF, RVSP 52.6 (moderate elevation), severe LAE, moderate RAE, trivial MR, mild MS (mean gradient 5.5 mmHg), mild aortic valve sclerosis (no AS); elevated E/e' c/w elevated LVEDP   Cough  DM type 2 (diabetes mellitus, type 2) (HCC)    GERD (gastroesophageal reflux disease)    History of diverticulitis of colon    HTN (hypertension)    Hyperlipidemia    IBS (irritable bowel syndrome)    Iron deficiency anemia    Mitral stenosis 04/17/2022   Echo 04/2022: EF 55-60, no RWMA, mild LVH, normal RVSF, normal PASP, moderate LAE, moderate mitral stenosis (mean gradient 4 mmHg), trivial MR, trivial AI, AV sclerosis without stenosis   Osteoporosis, unspecified    Renal insufficiency 07/31/2017   UTI (urinary tract infection)     Past Surgical History:  Procedure Laterality Date   CARDIOVASCULAR STRESS TEST  02/25/04   CORONARY BALLOON ANGIOPLASTY N/A 12/28/2021   Procedure: CORONARY BALLOON ANGIOPLASTY;  Surgeon: Kathleene Hazel, MD;  Location: [facility] INVASIVE CV LAB;  Service: Cardiovascular;  Laterality: N/A;   CORONARY STENT INTERVENTION N/A 12/28/2021   Procedure: CORONARY STENT INTERVENTION;  Surgeon: Kathleene Hazel, MD;  Location: [facility] INVASIVE CV LAB;  Service: Cardiovascular;  Laterality: N/A;   ESOPHAGOGASTRODUODENOSCOPY  12/26/01   PACEMAKER IMPLANT N/A 08/02/2020   Procedure: PACEMAKER IMPLANT;  Surgeon: Duke Salvia, MD;  Location: [facility] INVASIVE CV LAB;  Service: Cardiovascular;  Laterality: N/A;   RIGHT  OOPHORECTOMY  jan 2010   RIGHT/LEFT HEART CATH AND CORONARY ANGIOGRAPHY N/A 12/28/2021   Procedure: RIGHT/LEFT HEART CATH AND CORONARY ANGIOGRAPHY;  Surgeon: Kathleene Hazel, MD;  Location: [facility] INVASIVE CV LAB;  Service: Cardiovascular;  Laterality: N/A;    Family History  Problem Relation Age of Onset   Lung cancer Brother    Melanoma Brother    Hypertension Mother    Stroke Mother    Other Father        poor circulation   Cancer Sister    Diabetes Daughter    Atopy Neg Hx    Asthma Neg Hx    Breast cancer Neg Hx     Social History   Tobacco Use   Smoking status: Former    Current packs/day: 0.00    Average packs/day: 0.3 packs/day for 5.0 years (1.5 ttl pk-yrs)    Types: Cigarettes    Start date: 10/02/1981    Quit date: 10/02/1986    Years since quitting: 36.6   Smokeless tobacco: Never  Substance Use Topics   Alcohol use: No    Subjective:   Accompanied by her daughter; was seen in ER on Friday with acute asthma exacerbation; did have CT done which showed bronchial wall thickening and mucus plugging; is on final day of extended steroid prescription but notes "she doesn't feel well." Is scheduled to see her pulmonologist on Thursday;   Objective:  Vitals:   05/01/23 1109  BP: 136/74  Pulse: 76  SpO2: 100%  Weight: 168 lb 3.2 oz (76.3 kg)  Height: 5\' 7"  (1.702 m)    General: Well developed, well nourished, in no acute distress  Skin : Warm and dry.  Head: Normocephalic and atraumatic  Eyes: Sclera and conjunctiva clear; pupils round and reactive to light; extraocular movements intact  Ears: External normal; canals clear; tympanic membranes normal  Oropharynx: Pink, supple. No suspicious lesions  Neck: Supple without thyromegaly, adenopathy  Lungs: Respirations unlabored; wheeze noted in right upper lobe CVS exam: normal rate and regular rhythm.  Neurologic: Alert and oriented; speech intact; face symmetrical; moves all extremities well; CNII-XII intact  without focal deficit   Assessment:  1. Other fatigue   2. Exacerbation of persistent asthma, unspecified asthma  severity     Plan:  Physical exam is reassuring; complete prednisone as prescribed; will update labs today; keep planned follow up with pulmonology for Thursday;   No follow-ups on file.  Orders Placed This Encounter  Procedures   CBC with Differential/Platelet   Comp Met (CMET)    Requested Prescriptions    No prescriptions requested or ordered in this encounter

## 2023-05-03 ENCOUNTER — Encounter: Payer: Self-pay | Admitting: Nurse Practitioner

## 2023-05-03 ENCOUNTER — Telehealth: Payer: Self-pay | Admitting: *Deleted

## 2023-05-03 ENCOUNTER — Ambulatory Visit (INDEPENDENT_AMBULATORY_CARE_PROVIDER_SITE_OTHER): Payer: 59 | Admitting: Nurse Practitioner

## 2023-05-03 VITALS — BP 122/64 | HR 76 | Temp 98.1°F | Ht 66.0 in | Wt 166.2 lb

## 2023-05-03 DIAGNOSIS — I5042 Chronic combined systolic (congestive) and diastolic (congestive) heart failure: Secondary | ICD-10-CM | POA: Diagnosis not present

## 2023-05-03 DIAGNOSIS — J209 Acute bronchitis, unspecified: Secondary | ICD-10-CM

## 2023-05-03 DIAGNOSIS — M79642 Pain in left hand: Secondary | ICD-10-CM | POA: Diagnosis not present

## 2023-05-03 DIAGNOSIS — J45991 Cough variant asthma: Secondary | ICD-10-CM

## 2023-05-03 DIAGNOSIS — M179 Osteoarthritis of knee, unspecified: Secondary | ICD-10-CM | POA: Diagnosis not present

## 2023-05-03 DIAGNOSIS — B9689 Other specified bacterial agents as the cause of diseases classified elsewhere: Secondary | ICD-10-CM

## 2023-05-03 DIAGNOSIS — I442 Atrioventricular block, complete: Secondary | ICD-10-CM | POA: Diagnosis not present

## 2023-05-03 DIAGNOSIS — J019 Acute sinusitis, unspecified: Secondary | ICD-10-CM

## 2023-05-03 DIAGNOSIS — I214 Non-ST elevation (NSTEMI) myocardial infarction: Secondary | ICD-10-CM | POA: Diagnosis not present

## 2023-05-03 LAB — POCT EXHALED NITRIC OXIDE: FeNO level (ppb): 6

## 2023-05-03 MED ORDER — PREDNISONE 10 MG PO TABS: ORAL_TABLET | ORAL | 0 refills | Status: DC

## 2023-05-03 MED ORDER — FLUTICASONE PROPIONATE 50 MCG/ACT NA SUSP: 2.0000 | Freq: Every day | NASAL | 2 refills | Status: DC

## 2023-05-03 MED ORDER — DOXYCYCLINE HYCLATE 100 MG PO TABS: 100.0000 mg | ORAL_TABLET | Freq: Two times a day (BID) | ORAL | 0 refills | Status: AC

## 2023-05-03 MED ORDER — PROMETHAZINE-DM 6.25-15 MG/5ML PO SYRP
5.0000 mL | ORAL_SOLUTION | Freq: Three times a day (TID) | ORAL | 0 refills | Status: DC | PRN
Start: 2023-05-03 — End: 2023-07-06

## 2023-05-03 MED ORDER — BENZONATATE 200 MG PO CAPS
200.0000 mg | ORAL_CAPSULE | Freq: Three times a day (TID) | ORAL | 1 refills | Status: DC | PRN
Start: 2023-05-03 — End: 2024-02-14

## 2023-05-03 NOTE — Assessment & Plan Note (Signed)
Euvolemic on exam. No evidence of volume overload on recent CT imaging. See above. Follow up with cardiology as scheduled.

## 2023-05-03 NOTE — Assessment & Plan Note (Signed)
Unresolved acute bronchitic like illness with ABRS. Suspect a component of her cough is related to upper airway irritation from postnasal drainage. Will treat her with empiric doxycycline x 10 days and extend prednisone to taper. She does have mucus plugging on recent CT imaging - maximize bronchodilator and mucociliary clearance therapies. Target postnasal drainage/sinus symptoms with saline rinses and intranasal steroid. Close follow up. Strict return/ED precautions should symptoms worsen.   Patient Instructions  Continue Albuterol inhaler 2 puffs or 3 mL neb every 6 hours as needed for shortness of breath or wheezing. Notify if symptoms persist despite rescue inhaler/neb use.    Use your nebs three times a day then follow with flutter valve ten times  Continue Symbicort 2 puffs Twice daily. Brush tongue and rinse mouth afterwards Continue zyrtec 1 tab daily for allergies Continue singulair 1 tab At bedtime for allergies/asthma   -Prednisone taper. 3 tabs for 3 days, 2 tabs for 3 days, then 1 tab for 3 days, then stop. Take in AM with food -Doxycycline 1 tab Twice daily for 10 days. Take with food.  -Promethazine DM cough syrup 5 mL every 8 hours as needed for cough. May cause drowsiness. Do not drive after taking -Benzonatate 1 capsule Three times a day as needed for cough -Saline nasal rinses twice a day until sinus symptoms resolve. Use bottled distilled water and sodium chloride packets for these. Follow 20-30 minutes later with flonase nasal spray 2 sprays each nostril in the morning  -Guaifenesin (mucinex) [phone] mg Twice daily for cough/congestion   Follow up on 8/14 as scheduled. If symptoms do not improve or worsen, please contact office for sooner follow up or seek emergency care.

## 2023-05-03 NOTE — Assessment & Plan Note (Signed)
See above. Action plan in place. 

## 2023-05-03 NOTE — Assessment & Plan Note (Signed)
See above

## 2023-05-03 NOTE — Patient Instructions (Addendum)
Continue Albuterol inhaler 2 puffs or 3 mL neb every 6 hours as needed for shortness of breath or wheezing. Notify if symptoms persist despite rescue inhaler/neb use.    Use your nebs three times a day then follow with flutter valve ten times  Continue Symbicort 2 puffs Twice daily. Brush tongue and rinse mouth afterwards Continue zyrtec 1 tab daily for allergies Continue singulair 1 tab At bedtime for allergies/asthma   -Prednisone taper. 3 tabs for 3 days, 2 tabs for 3 days, then 1 tab for 3 days, then stop. Take in AM with food -Doxycycline 1 tab Twice daily for 10 days. Take with food.  -Promethazine DM cough syrup 5 mL every 8 hours as needed for cough. May cause drowsiness. Do not drive after taking -Benzonatate 1 capsule Three times a day as needed for cough -Saline nasal rinses twice a day until sinus symptoms resolve. Use bottled distilled water and sodium chloride packets for these. Follow 20-30 minutes later with flonase nasal spray 2 sprays each nostril in the morning  -Guaifenesin (mucinex) [phone] mg Twice daily for cough/congestion   Follow up on 8/14 as scheduled. If symptoms do not improve or worsen, please contact office for sooner follow up or seek emergency care.

## 2023-05-03 NOTE — Progress Notes (Signed)
@Patient  ID: [name], female    DOB: 1935/10/07, 87 y.o.   MRN: [id number]  Chief Complaint  Patient presents with   Follow-up    Over a week. Pt visit the ED on Friday. Congested, Stomach upset, sob.     Referring provider: Olive Bass,*  HPI: 87 year old female, former smoker followed for cough variant asthma. She is a patient of Dr. Thurston Hole and last seen in office 07/31/2022. Past medical history significant for HTN, CAD, a fib on Eliquis, complete heart block s/p pacemaker, CHF, NSTEMI, MS, allergic rhinitis, GERD, DM, CKD, HLD, anxiety, obesity.   TEST/EVENTS:  01/28/2018 Spirometry: FEV1 1.22 (65%), ratio 79  07/31/2022: OV with Dr. Sherene Sires. Maintained on Symbicort. Can go to [place] pushing the cart with [facility] parking. Sporadic cough. Uses SABA 1-2 times a week. FeNO 25 ppb. F/u yearly or PRN.  05/03/2023: Today - acute Patient presents today for acute visit. She has been having trouble with her breathing and cough for the last week. She saw her PCP on 7/19 and tested negative for COVID. She was treated with z pack. CXR at the time did not show any acute pulmonary process. She felt like she failed to improve so she went to the ED on 7/26. CT chest showed bronchial wall thickening and mucus plugging in the lower lobes. No infiltrates/consolidation. She was treated with prednisone burst, which she completed two days ago.  Today, she tells me that she's still having trouble with a productive cough with tan phlegm, chest congestion and discomfort with coughing. She feels more short winded than she usually is, but mostly with coughing spells. She is having a lot of sinus congestion and drainage. She's having facial tenderness as well. She feels like the drainage and cough is making her nauseated at time. Has not had any vomiting. Decreased appetite. No fevers, chills, hemoptysis, leg swelling, orthopnea, sore throat, ear pain. She did feel like the prednisone helped her breathing some but  hasn't done much for her sinuses or cough. She is using her neb treatments once a day. Taking her Symbicort twice a day.  FeNO 6 ppb  Allergies  Allergen Reactions   Aspirin Shortness Of Breath and Other (See Comments)    Caused asthma symptoms   Ramipril Cough    Immunization History  Administered Date(s) Administered   Fluad Quad(high Dose 65+) 06/12/2019, 10/08/2020, 09/28/2021, 07/20/2022   Influenza Split 07/17/2011, 07/05/2012   Influenza Whole 07/22/2008   Influenza, High Dose Seasonal PF 07/08/2018, 06/12/2019   Influenza,inj,Quad PF,6+ Mos 07/18/2013, 06/16/2014   Influenza-Unspecified 07/17/2011, 07/05/2012, 07/18/2013, 06/16/2014, 07/08/2018   PFIZER(Purple Top)SARS-COV-2 Vaccination 05/05/2020, 06/04/2020   Pneumococcal Conjugate-13 04/12/2016   Pneumococcal Polysaccharide-23 10/02/2006   Td 08/02/1998    Past Medical History:  Diagnosis Date   Allergic rhinitis    Anterior chest wall pain    Anxiety    Asthma    Chronic diastolic CHF (congestive heart failure) (HCC)    Echo 01/2020: EF 60-65, normal wall motion, mild LVH, normal RV SF, RVSP 52.6 (moderate elevation), severe LAE, moderate RAE, trivial MR, mild MS (mean gradient 5.5 mmHg), mild aortic valve sclerosis (no AS); elevated E/e' c/w elevated LVEDP   Cough    DM type 2 (diabetes mellitus, type 2) (HCC)    GERD (gastroesophageal reflux disease)    History of diverticulitis of colon    HTN (hypertension)    Hyperlipidemia    IBS (irritable bowel syndrome)    Iron deficiency anemia  Mitral stenosis 04/17/2022   Echo 04/2022: EF 55-60, no RWMA, mild LVH, normal RVSF, normal PASP, moderate LAE, moderate mitral stenosis (mean gradient 4 mmHg), trivial MR, trivial AI, AV sclerosis without stenosis   Osteoporosis, unspecified    Renal insufficiency 07/31/2017   UTI (urinary tract infection)     Tobacco History: Social History   Tobacco Use  Smoking Status Former   Current packs/day: 0.00   Average  packs/day: 0.3 packs/day for 5.0 years (1.5 ttl pk-yrs)   Types: Cigarettes   Start date: 10/02/1981   Quit date: 10/02/1986   Years since quitting: 36.6  Smokeless Tobacco Never   Counseling given: Not Answered   Outpatient Medications Prior to Visit  Medication Sig Dispense Refill   acetaminophen (TYLENOL) 325 MG tablet Take 650 mg by mouth every 6 (six) hours as needed for pain.     albuterol (PROAIR HFA) 108 (90 Base) MCG/ACT inhaler 2 puffs every 4 hours as needed only  if your can't catch your breath 18 g 11   albuterol (PROVENTIL) (2.5 MG/3ML) 0.083% nebulizer solution Take 3 mLs (2.5 mg total) by nebulization every 6 (six) hours as needed for wheezing or shortness of breath. 150 mL 1   Ascorbic Acid (VITAMIN C) 1000 MG tablet Take 1,000 mg by mouth daily.     atorvastatin (LIPITOR) 80 MG tablet Take 1 tablet (80 mg total) by mouth daily. 90 tablet 3   azithromycin (ZITHROMAX Z-PAK) 250 MG tablet Take 2 tablets (500 mg) PO today, then 1 tablet (250 mg) PO daily x4 days. 6 tablet 0   BD VEO INSULIN SYRINGE U/F 31G X 15/64" 0.5 ML MISC USE AS INSTRUCTED 100 each 3   Blood Glucose Monitoring Suppl (ONETOUCH VERIO FLEX SYSTEM) w/Device KIT USE AS DIRECTED 1 kit .   budesonide-formoterol (SYMBICORT) 160-4.5 MCG/ACT inhaler TAKE 2 PUFFS FIRST THING IN AM AND THEN ANOTHER 2 PUFFS ABOUT 12 HOURS LATER. 30.6 each 6   carvedilol (COREG) 3.125 MG tablet Take 1 tablet (3.125 mg total) by mouth 2 (two) times daily with a meal. 180 tablet 1   cetirizine (ZYRTEC ALLERGY) 10 MG tablet Take 1 tablet (10 mg total) by mouth daily as needed for allergies.     cholecalciferol (VITAMIN D3) 25 MCG (1000 UNIT) tablet Take 1,000 Units by mouth daily. Isn't taking regularly     clopidogrel (PLAVIX) 75 MG tablet TAKE 1 TABLET BY MOUTH DAILY WITH BREAKFAST. 90 tablet 2   diclofenac Sodium (VOLTAREN) 1 % GEL Apply 4 g topically 4 (four) times daily as needed (pain).     ELIQUIS 2.5 MG TABS tablet TAKE 1 TABLET BY  MOUTH TWICE A DAY 60 tablet 5   famotidine (PEPCID) 20 MG tablet Take 1 tablet (20 mg total) by mouth 2 (two) times daily. 180 tablet 1   FARXIGA 5 MG TABS tablet TAKE 1 TABLET BY MOUTH EVERY DAY BEFORE BREAKFAST 30 tablet 3   furosemide (LASIX) 40 MG tablet Take 1 tablet (40 mg total) by mouth 2 (two) times daily. 180 tablet 3   glucose blood (ONETOUCH VERIO) test strip 1 each by Other route 2 (two) times daily. And lancets 2/day 200 each 3   glucose blood test strip Check blood sugar twice a day 100 each 12   hydrALAZINE (APRESOLINE) 25 MG tablet Take 3 tablets (75 mg total) by mouth 3 (three) times daily. 270 tablet 8   insulin lispro (HUMALOG) 100 UNIT/ML injection Give 3 units with BREAKFAST, AND 18 units  with SUPPER 60 mL 1   latanoprost (XALATAN) 0.005 % ophthalmic solution Place 1 drop into both eyes at bedtime.     montelukast (SINGULAIR) 10 MG tablet TAKE 1 TABLET BY MOUTH EVERYDAY AT BEDTIME 90 tablet 1   nitroGLYCERIN (NITROSTAT) 0.4 MG SL tablet Place 1 tablet (0.4 mg total) under the tongue every 5 (five) minutes x 3 doses as needed for chest pain. 25 tablet 2   ondansetron (ZOFRAN) 8 MG tablet Take 1 tablet (8 mg total) by mouth every 8 (eight) hours as needed for nausea or vomiting. 20 tablet 0   pantoprazole (PROTONIX) 40 MG tablet TAKE 1 TABLET BY MOUTH TWICE A DAY 180 tablet 3   potassium chloride (KLOR-CON) 10 MEQ tablet TAKE 1 TABLET BY MOUTH EVERY DAY 90 tablet 1   sodium bicarbonate 650 MG tablet Take 650 mg by mouth 2 (two) times daily.     Syringe/Needle, Disp, (SYRINGE 3CC/25GX1") 25G X 1" 3 ML MISC Use to inject into the skin 2x a day 100 each 3   vitamin B-12 (CYANOCOBALAMIN) 100 MCG tablet Take 100 mcg by mouth daily.     predniSONE (DELTASONE) 20 MG tablet Take 2 tablets (40 mg total) by mouth daily. (Patient not taking: Reported on 05/03/2023) 8 tablet 0   Facility-Administered Medications Prior to Visit  Medication Dose Route Frequency Provider Last Rate Last Admin    ipratropium-albuterol (DUONEB) 0.5-2.5 (3) MG/3ML nebulizer solution 3 mL  3 mL Nebulization Once Olive Bass, FNP         Review of Systems:   Constitutional: No weight loss or gain, night sweats, fevers, chills, or lassitude. +fatigue  HEENT: No headaches, difficulty swallowing, tooth/dental problems, or sore throat. No sneezing, itching, ear ache. +nasal congestion/drainage, sinus tenderness  CV:  No chest pain, orthopnea, PND, swelling in lower extremities, anasarca, dizziness, palpitations, syncope Resp: +shortness of breath with exertion; productive cough; chest congestion; pleuritic pain. No hemoptysis. No wheezing.  No chest wall deformity GI:  +nausea, decreased appetite. No vomiting, diarrhea, abd pain, changes in bowel habits GU: No dysuria, change in color of urine, urgency or frequency. Skin: No rash, lesions, ulcerations MSK:  No joint pain or swelling.   Neuro: No dizziness or lightheadedness.  Psych: No depression or anxiety. Mood stable.     Physical Exam:  BP 122/64   Pulse 76   Temp 98.1 F (36.7 C) (Oral)   Ht 5\' 6"  (1.676 m)   Wt 166 lb 3.2 oz (75.4 kg)   SpO2 99%   BMI 26.83 kg/m   GEN: Pleasant, interactive, well-kempt; acutely ill appearing; in no acute distress. HEENT:  Normocephalic and atraumatic. EACs patent bilaterally. TM pearly gray with present light reflex bilaterally. PERRLA. Sclera white. Nasal turbinates erythematous, moist and patent bilaterally. White rhinorrhea present. Oropharynx erythematous and moist, without exudate or edema. Maxillary and frontal sinus tenderness. No lesions, ulcerations NECK:  Supple w/ fair ROM. No JVD present. Normal carotid impulses w/o bruits. Thyroid symmetrical with no goiter or nodules palpated. Cervical lymphadenopathy.   CV: RRR, no m/r/g, no peripheral edema. Pulses intact, +2 bilaterally. No cyanosis, pallor or clubbing. PULMONARY:  Unlabored, regular breathing. Scattered rhonchi bilaterally A&P.  Congested cough. No accessory muscle use.  GI: BS present and normoactive. Soft, non-tender to palpation. No organomegaly or masses detected.  MSK: No erythema, warmth or tenderness. Cap refil <2 sec all extrem. No deformities or joint swelling noted.  Neuro: A/Ox3. No focal deficits noted.   Skin: Warm,  no lesions or rashe Psych: Normal affect and behavior. Judgement and thought content appropriate.     Lab Results:  CBC    Component Value Date/Time   WBC 11.0 (H) 05/01/2023 1141   RBC 5.22 (H) 05/01/2023 1141   HGB 14.3 05/01/2023 1141   HGB 11.0 (L) 07/28/2021 0852   HCT 43.2 05/01/2023 1141   HCT 33.0 (L) 07/28/2021 0852   PLT 133.0 (L) 05/01/2023 1141   PLT 162 07/28/2021 0852   MCV 82.9 05/01/2023 1141   MCV 80 07/28/2021 0852   MCH 27.4 04/27/2023 1908   MCHC 33.2 05/01/2023 1141   RDW 17.2 (H) 05/01/2023 1141   RDW 15.6 (H) 07/28/2021 0852   LYMPHSABS 1.0 05/01/2023 1141   MONOABS 0.8 05/01/2023 1141   EOSABS 0.0 05/01/2023 1141   BASOSABS 0.0 05/01/2023 1141    BMET    Component Value Date/Time   NA 133 (L) 05/01/2023 1141   NA 141 08/23/2022 0000   K 4.6 05/01/2023 1141   CL 96 05/01/2023 1141   CO2 25 05/01/2023 1141   GLUCOSE 471 (H) 05/01/2023 1141   BUN 66 (H) 05/01/2023 1141   BUN 46 (A) 08/23/2022 0000   CREATININE 2.74 (H) 05/01/2023 1141   CREATININE 1.74 (H) 08/28/2016 1042   CALCIUM 9.5 05/01/2023 1141   CALCIUM 10.2 07/06/2011 1144   GFRNONAA 19 (L) 04/27/2023 1908   GFRAA 28 (L) 08/06/2020 1526    BNP    Component Value Date/Time   BNP 138.2 (H) 04/27/2023 1908     Imaging:  CUP PACEART REMOTE DEVICE CHECK  Result Date: 04/30/2023 Scheduled remote reviewed. Normal device function.  Next remote 91 days. - CS, CVRS  CT Chest Wo Contrast  Result Date: 04/27/2023 CLINICAL DATA:  Respiratory illness, nondiagnostic x-ray EXAM: CT CHEST WITHOUT CONTRAST TECHNIQUE: Multidetector CT imaging of the chest was performed following the  standard protocol without IV contrast. RADIATION DOSE REDUCTION: This exam was performed according to the departmental dose-optimization program which includes automated exposure control, adjustment of the mA and/or kV according to patient size and/or use of iterative reconstruction technique. COMPARISON:  Radiographs 04/20/2023 and CTA chest 12/30/2019 FINDINGS: Cardiovascular: Left chest wall pacemaker. Mitral annular and aortic valve calcification. Coronary artery and aortic atherosclerotic calcification. No pericardial effusion. Mediastinum/Nodes: Small hiatal hernia. Mild wall thickening of the lower esophagus. Trachea is unremarkable. No thoracic adenopathy. Lungs/Pleura: Mild basilar atelectasis. Bronchial wall thickening and mucous plugging in the lower lobes greatest in the left lower lobe. No pleural effusion or pneumothorax. Upper Abdomen: Cholelithiasis.  No acute abnormality. Musculoskeletal: No acute fracture. IMPRESSION: 1. Bronchial wall thickening and mucous plugging in the lower lobes greatest in the left lower lobe. 2. Small hiatal hernia with mild wall thickening of the lower esophagus suggesting esophagitis. 3. Cholelithiasis. Aortic Atherosclerosis (ICD10-I70.0). Electronically Signed   By: Minerva Fester M.D.   On: 04/27/2023 20:56   DG Chest 2 View  Result Date: 04/27/2023 CLINICAL DATA:  Cough, shortness of breath EXAM: CHEST - 2 VIEW COMPARISON:  01/12/2023 FINDINGS: Transverse diameter of heart is increased. There are no signs of pulmonary edema or focal pulmonary consolidation. There is no pleural effusion or pneumothorax. Pacemaker battery is seen in the left infraclavicular region with tip of lead in right ventricle. IMPRESSION: Cardiomegaly. There are no signs of pulmonary edema or focal pulmonary consolidation. Electronically Signed   By: Ernie Avena M.D.   On: 04/27/2023 19:04   ECHOCARDIOGRAM COMPLETE  Result Date: 04/09/2023  ECHOCARDIOGRAM REPORT   Patient Name:    [name]   Date of Exam: 04/09/2023 Medical Rec #:  [id number]     Height:       67.0 in Accession #:    [id number]    Weight:       170.6 lb Date of Birth:  Jul 18, 1936      BSA:          1.890 m Patient Age:    86 years      BP:           124/70 mmHg Patient Gender: F             HR:           77 bpm. Exam Location:  [address] Procedure: 2D Echo, Cardiac Doppler, Color Doppler and 3D Echo Indications:    Mitral Valve Stenosis I34.2  History:        Patient has prior history of Echocardiogram examinations, most                 recent 04/17/2022. CHF, Pacemaker; Risk Factors:Hypertension,                 Dyslipidemia and Diabetes.  Sonographer:    Thurman Coyer RDCS Referring Phys: [id number] MICHELE M LENZE IMPRESSIONS  1. Left ventricular ejection fraction, by estimation, is 55 to 60%. The left ventricle has normal function. The left ventricle has no regional wall motion abnormalities. There is mild concentric left ventricular hypertrophy. Left ventricular diastolic function could not be evaluated.  2. Right ventricular systolic function is normal. The right ventricular size is normal. There is normal pulmonary artery systolic pressure.  3. Left atrial size was severely dilated.  4. Right atrial size was severely dilated.  5. Mitral valve mean gradient 5 mmHg compared with 4 mmHg 04/2022. The mitral valve is degenerative. No evidence of mitral valve regurgitation. Moderate mitral stenosis. Moderate mitral annular calcification.  6. The aortic valve is tricuspid. There is mild calcification of the aortic valve. There is mild thickening of the aortic valve. Aortic valve regurgitation is not visualized. No aortic stenosis is present.  7. The inferior vena cava is normal in size with greater than 50% respiratory variability, suggesting right atrial pressure of 3 mmHg. FINDINGS  Left Ventricle: Left ventricular ejection fraction, by estimation, is 55 to 60%. The left ventricle has normal function. The left ventricle has  no regional wall motion abnormalities. The left ventricular internal cavity size was normal in size. There is  mild concentric left ventricular hypertrophy. Left ventricular diastolic function could not be evaluated due to atrial fibrillation. Left ventricular diastolic function could not be evaluated. Right Ventricle: The right ventricular size is normal. No increase in right ventricular wall thickness. Right ventricular systolic function is normal. There is normal pulmonary artery systolic pressure. The tricuspid regurgitant velocity is 2.60 m/s, and  with an assumed right atrial pressure of 3 mmHg, the estimated right ventricular systolic pressure is 30.0 mmHg. Left Atrium: Left atrial size was severely dilated. Right Atrium: Right atrial size was severely dilated. Pericardium: There is no evidence of pericardial effusion. Mitral Valve: Mitral valve mean gradient 5 mmHg compared with 4 mmHg 04/2022. The mitral valve is degenerative in appearance. There is moderate thickening of the mitral valve leaflet(s). There is moderate calcification of the mitral valve leaflet(s). Moderately decreased mobility of the mitral valve leaflets. Moderate mitral annular calcification. No evidence of mitral valve regurgitation. Moderate mitral valve stenosis. MV  peak gradient, 10.1 mmHg. The mean mitral valve gradient is 5.0 mmHg with average heart rate of 60 bpm. Tricuspid Valve: The tricuspid valve is normal in structure. Tricuspid valve regurgitation is mild . No evidence of tricuspid stenosis. Aortic Valve: The aortic valve is tricuspid. There is mild calcification of the aortic valve. There is mild thickening of the aortic valve. Aortic valve regurgitation is not visualized. No aortic stenosis is present. Pulmonic Valve: The pulmonic valve was normal in structure. Pulmonic valve regurgitation is mild. No evidence of pulmonic stenosis. Aorta: The aortic root is normal in size and structure. Venous: The inferior vena cava is  normal in size with greater than 50% respiratory variability, suggesting right atrial pressure of 3 mmHg. IAS/Shunts: No atrial level shunt detected by color flow Doppler.  LEFT VENTRICLE PLAX 2D LVIDd:         5.00 cm LVIDs:         3.50 cm LV PW:         1.26 cm LV IVS:        1.24 cm LVOT diam:     2.30 cm   3D Volume EF: LV SV:         67        3D EF:        60 % LV SV Index:   35        LV EDV:       55 ml LVOT Area:     4.15 cm  LV ESV:       22 ml                          LV SV:        33 ml RIGHT VENTRICLE RV S prime:     8.67 cm/s TAPSE (M-mode): 1.1 cm LEFT ATRIUM              Index        RIGHT ATRIUM           Index LA diam:        5.10 cm  2.70 cm/m   RA Area:     29.10 cm LA Vol (A2C):   145.0 ml 76.71 ml/m  RA Volume:   99.70 ml  52.75 ml/m LA Vol (A4C):   129.0 ml 68.25 ml/m LA Biplane Vol: 139.0 ml 73.54 ml/m  AORTIC VALVE LVOT Vmax:   77.60 cm/s LVOT Vmean:  49.800 cm/s LVOT VTI:    0.161 m  AORTA Ao Root diam: 2.80 cm Ao Asc diam:  3.40 cm MITRAL VALVE                TRICUSPID VALVE MV Area (PHT): 2.76 cm     TR Peak grad:   27.0 mmHg MV Area VTI:   1.32 cm     TR Vmax:        260.00 cm/s MV Peak grad:  10.1 mmHg MV Mean grad:  5.0 mmHg     SHUNTS MV Vmax:       1.59 m/s     Systemic VTI:  0.16 m MV Vmean:      101.0 cm/s   Systemic Diam: 2.30 cm MV Decel Time: 275 msec MV E velocity: 125.00 cm/s MV A velocity: 60.80 cm/s MV E/A ratio:  2.06 Chilton Si MD Electronically signed by Chilton Si MD Signature Date/Time: 04/09/2023/12:29:27 PM    Final     Administration History  None           No data to display          Lab Results  Component Value Date   NITRICOXIDE 17 01/28/2018        Assessment & Plan:   Acute bronchitis Unresolved acute bronchitic like illness with ABRS. Suspect a component of her cough is related to upper airway irritation from postnasal drainage. Will treat her with empiric doxycycline x 10 days and extend prednisone to taper.  She does have mucus plugging on recent CT imaging - maximize bronchodilator and mucociliary clearance therapies. Target postnasal drainage/sinus symptoms with saline rinses and intranasal steroid. Close follow up. Strict return/ED precautions should symptoms worsen.   Patient Instructions  Continue Albuterol inhaler 2 puffs or 3 mL neb every 6 hours as needed for shortness of breath or wheezing. Notify if symptoms persist despite rescue inhaler/neb use.    Use your nebs three times a day then follow with flutter valve ten times  Continue Symbicort 2 puffs Twice daily. Brush tongue and rinse mouth afterwards Continue zyrtec 1 tab daily for allergies Continue singulair 1 tab At bedtime for allergies/asthma   -Prednisone taper. 3 tabs for 3 days, 2 tabs for 3 days, then 1 tab for 3 days, then stop. Take in AM with food -Doxycycline 1 tab Twice daily for 10 days. Take with food.  -Promethazine DM cough syrup 5 mL every 8 hours as needed for cough. May cause drowsiness. Do not drive after taking -Benzonatate 1 capsule Three times a day as needed for cough -Saline nasal rinses twice a day until sinus symptoms resolve. Use bottled distilled water and sodium chloride packets for these. Follow 20-30 minutes later with flonase nasal spray 2 sprays each nostril in the morning  -Guaifenesin (mucinex) [phone] mg Twice daily for cough/congestion   Follow up on 8/14 as scheduled. If symptoms do not improve or worsen, please contact office for sooner follow up or seek emergency care.    Cough variant asthma See above. Action plan in place.   Acute bacterial rhinosinusitis See above  Chronic combined systolic (congestive) and diastolic (congestive) heart failure (HCC) Euvolemic on exam. No evidence of volume overload on recent CT imaging. See above. Follow up with cardiology as scheduled.    I spent 42 minutes of dedicated to the care of this patient on the date of this encounter to include pre-visit  review of records, face-to-face time with the patient discussing conditions above, post visit ordering of testing, clinical documentation with the electronic health record, making appropriate referrals as documented, and communicating necessary findings to members of the patients care team.  Noemi Chapel, NP 05/03/2023  Pt aware and understands NP's role.

## 2023-05-03 NOTE — Telephone Encounter (Signed)
Transition Care Management Unsuccessful Follow-up Telephone Call  Date of discharge and from where:  [facility] 04/26/2023  Attempts:  2nd Attempt  Reason for unsuccessful TCM follow-up call:  No answer/busy

## 2023-05-07 ENCOUNTER — Ambulatory Visit: Payer: 59 | Admitting: Podiatry

## 2023-05-07 DIAGNOSIS — E1151 Type 2 diabetes mellitus with diabetic peripheral angiopathy without gangrene: Secondary | ICD-10-CM

## 2023-05-07 DIAGNOSIS — B351 Tinea unguium: Secondary | ICD-10-CM

## 2023-05-07 DIAGNOSIS — L84 Corns and callosities: Secondary | ICD-10-CM

## 2023-05-07 NOTE — Progress Notes (Signed)
    Subjective:  Patient ID: [name], female    DOB: 1936/02/02,  MRN: [id number]  [name] presents to clinic today for:  Chief Complaint  Patient presents with   Nail Problem    dfc  . Patient notes nails are thick and elongated, causing pain in shoe gear when ambulating.  She also has a painful callus at the base of the fifth metatarsal on the left foot  PCP is Olive Bass, FNP.  Allergies  Allergen Reactions   Aspirin Shortness Of Breath and Other (See Comments)    Caused asthma symptoms   Ramipril Cough    Review of Systems: Negative except as noted in the HPI.  Objective:  There were no vitals filed for this visit.  [name] is a pleasant 87 y.o. female in NAD. AAO x 3.  Vascular Examination: Patient has palpable DP pulse, absent PT pulse bilateral.  Delayed capillary refill bilateral toes.  Sparse digital hair bilateral.  Proximal to distal cooling WNL bilateral.    Dermatological Examination: Interspaces are clear with no open lesions noted bilateral.  Nails are 3-7mm thick, with yellowish/brown discoloration, subungual debris and distal onycholysis x10.  There is pain with compression of nails x10.  There are hyperkeratotic lesions noted plantar aspect of the left fifth metatarsal base, with small focal corn on the plantar central aspect of the left heel.     Latest Ref Rng & Units 02/01/2023    8:13 AM 10/13/2022    8:58 AM 06/23/2022   11:59 AM  Hemoglobin A1C  Hemoglobin-A1c 4.6 - 6.5 % 6.8  5.8  5.5    Patient qualifies for at-risk foot care because of diabetes with PVD.  Assessment/Plan: 1. Dermatophytosis of nail   2. Type II diabetes mellitus with peripheral circulatory disorder (HCC)   3. Callus of foot    Mycotic nails x10 were sharply debrided with sterile nail nippers and power debriding burr to decrease bulk and length.  Hyperkeratotic lesions x 2 were shaved with #[id number] blade.   Return in about 3 months (around 08/07/2023) for  [facility]   Clerance Lav, DPM, FACFAS [facility]     [address] [place], [place] [address]                Office [phone]  Fax [phone]

## 2023-05-09 ENCOUNTER — Ambulatory Visit (INDEPENDENT_AMBULATORY_CARE_PROVIDER_SITE_OTHER): Payer: 59 | Admitting: "Endocrinology

## 2023-05-09 ENCOUNTER — Other Ambulatory Visit: Payer: Self-pay | Admitting: Internal Medicine

## 2023-05-09 ENCOUNTER — Encounter: Payer: Self-pay | Admitting: "Endocrinology

## 2023-05-09 VITALS — BP 90/65 | HR 51 | Ht 66.0 in | Wt 165.4 lb

## 2023-05-09 DIAGNOSIS — Z7984 Long term (current) use of oral hypoglycemic drugs: Secondary | ICD-10-CM | POA: Diagnosis not present

## 2023-05-09 DIAGNOSIS — Z794 Long term (current) use of insulin: Secondary | ICD-10-CM | POA: Diagnosis not present

## 2023-05-09 DIAGNOSIS — E1122 Type 2 diabetes mellitus with diabetic chronic kidney disease: Secondary | ICD-10-CM

## 2023-05-09 DIAGNOSIS — N184 Chronic kidney disease, stage 4 (severe): Secondary | ICD-10-CM | POA: Diagnosis not present

## 2023-05-09 DIAGNOSIS — E7800 Pure hypercholesterolemia, unspecified: Secondary | ICD-10-CM | POA: Diagnosis not present

## 2023-05-09 LAB — POCT GLYCOSYLATED HEMOGLOBIN (HGB A1C): Hemoglobin A1C: 9.9 % — AB (ref 4.0–5.6)

## 2023-05-09 MED ORDER — ONETOUCH VERIO VI STRP
1.0000 | ORAL_STRIP | Freq: Two times a day (BID) | 3 refills | Status: AC
Start: 2023-05-09 — End: ?

## 2023-05-09 NOTE — Progress Notes (Signed)
Outpatient Endocrinology Note Altamese Dumbarton, MD    MASEN WINDELL Jul 03, 1936 [id number]  Referring Provider: Olive Bass,* Primary Care Provider: Olive Bass, FNP Reason for consultation: Subjective  Type 2 diabetes mellitus  Assessment & Plan  Diagnoses and all orders for this visit:  Type 2 diabetes mellitus with stage 4 chronic kidney disease, with long-term current use of insulin (HCC) -     POCT glycosylated hemoglobin (Hb A1C) -     glucose blood (ONETOUCH VERIO) test strip; 1 each by Other route 2 (two) times daily. And lancets 2/day  Long term (current) use of oral hypoglycemic drugs  Pure hypercholesterolemia    Diabetes complicated by bypass, Stage 4 CKD Hba1c goal less than 7.0, current Hba1c is 9.9 (on prednisone 10 mg every day)  Will recommend for the following change of medications to: Farxiga 5 mg daily  Humalog 3 units fifteen min before breakfast and lunch and 18 units before dinner, increase by 1 unit everyday until blood sugar is close to 150 Check BG TIDAC and share data Pt is not currently checking BG and unable to make changes to her dose   No known contraindications to any of above medications Pacemaker restricts use of libre-not studied  Hyperlipidemia, overweight  -Last LDL 41 -on atorvastatin 80 mg QD -Follow low fat diet and exercise   -Blood pressure goal <140/90 - Microalbumin/creatinine on goal < 30 in 08/2022, GFR 16 -Not on ACE/ARB, deferred to nephrology  -diet changes including salt restriction -limit eating outside -counseled BP targets per standards of diabetes care -Uncontrolled blood pressure can lead to retinopathy, nephropathy and cardiovascular and atherosclerotic heart disease  Reviewed and counseled on: -A1C target -Blood sugar targets -Complications of uncontrolled diabetes  -Checking blood sugar before meals and bedtime and bring log next visit -All medications with mechanism of action and  side effects -Hypoglycemia management: rule of 15's, Glucagon Emergency Kit and medical alert ID -low-carb low-fat plate-method diet -At least 20 minutes of physical activity per day -Annual dilated retinal eye exam and foot exam -compliance and follow up needs -follow up as scheduled or earlier if problem gets worse  Call if blood sugar is less than 70 or consistently above 250    Take a 15 gm snack of carbohydrate at bedtime before you go to sleep if your blood sugar is less than 100.    If you are going to fast after midnight for a test or procedure, ask your physician for instructions on how to reduce/decrease your insulin dose.    Call if blood sugar is less than 70 or consistently above 250  -Treating a low sugar by rule of 15  (15 gms of sugar every 15 min until sugar is more than 70) If you feel your sugar is low, test your sugar to be sure If your sugar is low (less than 70), then take 15 grams of a fast acting Carbohydrate (3-4 glucose tablets or glucose gel or 4 ounces of juice or regular soda) Recheck your sugar 15 min after treating low to make sure it is more than 70 If sugar is still less than 70, treat again with 15 grams of carbohydrate          Don't drive the hour of hypoglycemia  If unconscious/unable to eat or drink by mouth, use glucagon injection or nasal spray baqsimi and call 911. Can repeat again in 15 min if still unconscious.  Return in about 3 months (around  07/31/2023).   I spent more than 50% of today's visit counseling patient on symptoms, examination findings, lab findings, imaging results, treatment decisions and monitoring and prognosis. The patient understood the recommendations and agrees with the treatment plan. All questions regarding treatment plan were fully answered.  Altamese Bessemer, MD  05/09/23    History of Present Illness GLENDOLYN MELIA is a 87 y.o. year old female who presents for follow up of Type 2 diabetes mellitus.  SAYRE TURRENTINE was  first diagnosed in 1996.    Patient is accompanied by daughter who lives with her   Home diabetes regimen: Farxiga 5 mg daily  Humalog 3 units five min before breakfast and 18 before dinner   Previous history:  Non-insulin hypoglycemic drugs previously used: Unknown Insulin was started in 2008 Previously on Lantus insulin that was stopped in 2021   COMPLICATIONS +  MI s/p stents  -  retinopathy, last eye exam 2023 -  neuropathy +  nephropathy   BLOOD SUGAR DATA No data brought, not check BG  Physical Exam  BP 90/65   Pulse (!) 51   Ht 5\' 6"  (1.676 m)   Wt 165 lb 6.4 oz (75 kg)   SpO2 98%   BMI 26.70 kg/m    Constitutional: well developed, well nourished Head: normocephalic, atraumatic Eyes: sclera anicteric, no redness Neck: supple Lungs: normal respiratory effort Neurology: alert and oriented Skin: dry, no appreciable rashes Musculoskeletal: no appreciable defects Psychiatric: normal mood and affect Diabetic Foot Exam - Simple   No data filed      Current Medications Patient's Medications  New Prescriptions   No medications on file  Previous Medications   ACETAMINOPHEN (TYLENOL) 325 MG TABLET    Take 650 mg by mouth every 6 (six) hours as needed for pain.   ALBUTEROL (PROAIR HFA) 108 (90 BASE) MCG/ACT INHALER    2 puffs every 4 hours as needed only  if your can't catch your breath   ALBUTEROL (PROVENTIL) (2.5 MG/3ML) 0.083% NEBULIZER SOLUTION    Take 3 mLs (2.5 mg total) by nebulization every 6 (six) hours as needed for wheezing or shortness of breath.   ASCORBIC ACID (VITAMIN C) 1000 MG TABLET    Take 1,000 mg by mouth daily.   ATORVASTATIN (LIPITOR) 80 MG TABLET    Take 1 tablet (80 mg total) by mouth daily.   AZITHROMYCIN (ZITHROMAX Z-PAK) 250 MG TABLET    Take 2 tablets (500 mg) PO today, then 1 tablet (250 mg) PO daily x4 days.   BD VEO INSULIN SYRINGE U/F 31G X 15/64" 0.5 ML MISC    USE AS INSTRUCTED   BENZONATATE (TESSALON) 200 MG CAPSULE    Take 1  capsule (200 mg total) by mouth 3 (three) times daily as needed.   BLOOD GLUCOSE MONITORING SUPPL (ONETOUCH VERIO FLEX SYSTEM) W/DEVICE KIT    USE AS DIRECTED   BUDESONIDE-FORMOTEROL (SYMBICORT) 160-4.5 MCG/ACT INHALER    TAKE 2 PUFFS FIRST THING IN AM AND THEN ANOTHER 2 PUFFS ABOUT 12 HOURS LATER.   CARVEDILOL (COREG) 3.125 MG TABLET    Take 1 tablet (3.125 mg total) by mouth 2 (two) times daily with a meal.   CETIRIZINE (ZYRTEC ALLERGY) 10 MG TABLET    Take 1 tablet (10 mg total) by mouth daily as needed for allergies.   CHOLECALCIFEROL (VITAMIN D3) 25 MCG (1000 UNIT) TABLET    Take 1,000 Units by mouth daily. Isn't taking regularly   CLOPIDOGREL (PLAVIX) 75 MG TABLET  TAKE 1 TABLET BY MOUTH DAILY WITH BREAKFAST.   DICLOFENAC SODIUM (VOLTAREN) 1 % GEL    Apply 4 g topically 4 (four) times daily as needed (pain).   DOXYCYCLINE (VIBRA-TABS) 100 MG TABLET    Take 1 tablet (100 mg total) by mouth 2 (two) times daily for 10 days.   ELIQUIS 2.5 MG TABS TABLET    TAKE 1 TABLET BY MOUTH TWICE A DAY   FAMOTIDINE (PEPCID) 20 MG TABLET    Take 1 tablet (20 mg total) by mouth 2 (two) times daily.   FARXIGA 5 MG TABS TABLET    TAKE 1 TABLET BY MOUTH EVERY DAY BEFORE BREAKFAST   FLUTICASONE (FLONASE) 50 MCG/ACT NASAL SPRAY    Place 2 sprays into both nostrils daily.   FUROSEMIDE (LASIX) 40 MG TABLET    Take 1 tablet (40 mg total) by mouth 2 (two) times daily.   GLUCOSE BLOOD TEST STRIP    Check blood sugar twice a day   HYDRALAZINE (APRESOLINE) 25 MG TABLET    Take 3 tablets (75 mg total) by mouth 3 (three) times daily.   INSULIN LISPRO (HUMALOG) 100 UNIT/ML INJECTION    Give 3 units with BREAKFAST, AND 18 units with SUPPER   LATANOPROST (XALATAN) 0.005 % OPHTHALMIC SOLUTION    Place 1 drop into both eyes at bedtime.   MONTELUKAST (SINGULAIR) 10 MG TABLET    TAKE 1 TABLET BY MOUTH EVERYDAY AT BEDTIME   NITROGLYCERIN (NITROSTAT) 0.4 MG SL TABLET    Place 1 tablet (0.4 mg total) under the tongue every 5  (five) minutes x 3 doses as needed for chest pain.   ONDANSETRON (ZOFRAN) 8 MG TABLET    Take 1 tablet (8 mg total) by mouth every 8 (eight) hours as needed for nausea or vomiting.   PANTOPRAZOLE (PROTONIX) 40 MG TABLET    TAKE 1 TABLET BY MOUTH TWICE A DAY   POTASSIUM CHLORIDE (KLOR-CON) 10 MEQ TABLET    TAKE 1 TABLET BY MOUTH EVERY DAY   PREDNISONE (DELTASONE) 10 MG TABLET    3 tabs for 3 days, 2 tabs for 3 days, then 1 tab for 3 days, then stop   PROMETHAZINE-DEXTROMETHORPHAN (PROMETHAZINE-DM) 6.25-15 MG/5ML SYRUP    Take 5 mLs by mouth every 8 (eight) hours as needed for cough.   SODIUM BICARBONATE 650 MG TABLET    Take 650 mg by mouth 2 (two) times daily.   SYRINGE/NEEDLE, DISP, (SYRINGE 3CC/25GX1") 25G X 1" 3 ML MISC    Use to inject into the skin 2x a day   VITAMIN B-12 (CYANOCOBALAMIN) 100 MCG TABLET    Take 100 mcg by mouth daily.  Modified Medications   Modified Medication Previous Medication   GLUCOSE BLOOD (ONETOUCH VERIO) TEST STRIP glucose blood (ONETOUCH VERIO) test strip      1 each by Other route 2 (two) times daily. And lancets 2/day    1 each by Other route 2 (two) times daily. And lancets 2/day  Discontinued Medications   No medications on file    Allergies Allergies  Allergen Reactions   Aspirin Shortness Of Breath and Other (See Comments)    Caused asthma symptoms   Ramipril Cough    Past Medical History Past Medical History:  Diagnosis Date   Allergic rhinitis    Anterior chest wall pain    Anxiety    Asthma    Chronic diastolic CHF (congestive heart failure) (HCC)    Echo 01/2020: EF 60-65, normal wall motion,  mild LVH, normal RV SF, RVSP 52.6 (moderate elevation), severe LAE, moderate RAE, trivial MR, mild MS (mean gradient 5.5 mmHg), mild aortic valve sclerosis (no AS); elevated E/e' c/w elevated LVEDP   Cough    DM type 2 (diabetes mellitus, type 2) (HCC)    GERD (gastroesophageal reflux disease)    History of diverticulitis of colon    HTN  (hypertension)    Hyperlipidemia    IBS (irritable bowel syndrome)    Iron deficiency anemia    Mitral stenosis 04/17/2022   Echo 04/2022: EF 55-60, no RWMA, mild LVH, normal RVSF, normal PASP, moderate LAE, moderate mitral stenosis (mean gradient 4 mmHg), trivial MR, trivial AI, AV sclerosis without stenosis   Osteoporosis, unspecified    Renal insufficiency 07/31/2017   UTI (urinary tract infection)     Past Surgical History Past Surgical History:  Procedure Laterality Date   CARDIOVASCULAR STRESS TEST  02/25/04   CORONARY BALLOON ANGIOPLASTY N/A 12/28/2021   Procedure: CORONARY BALLOON ANGIOPLASTY;  Surgeon: Kathleene Hazel, MD;  Location: [facility];  Service: Cardiovascular;  Laterality: N/A;   CORONARY STENT INTERVENTION N/A 12/28/2021   Procedure: CORONARY STENT INTERVENTION;  Surgeon: Kathleene Hazel, MD;  Location: [facility];  Service: Cardiovascular;  Laterality: N/A;   ESOPHAGOGASTRODUODENOSCOPY  12/26/01   PACEMAKER IMPLANT N/A 08/02/2020   Procedure: PACEMAKER IMPLANT;  Surgeon: Duke Salvia, MD;  Location: [facility];  Service: Cardiovascular;  Laterality: N/A;   RIGHT OOPHORECTOMY  jan 2010   RIGHT/LEFT HEART CATH AND CORONARY ANGIOGRAPHY N/A 12/28/2021   Procedure: RIGHT/LEFT HEART CATH AND CORONARY ANGIOGRAPHY;  Surgeon: Kathleene Hazel, MD;  Location: [facility];  Service: Cardiovascular;  Laterality: N/A;    Family History family history includes Cancer in her sister; Diabetes in her daughter; Hypertension in her mother; Lung cancer in her brother; Melanoma in her brother; Other in her father; Stroke in her mother.  Social History Social History   Socioeconomic History   Marital status: Widowed    Spouse name: Not on file   Number of children: Not on file   Years of education: Not on file   Highest education level: Not on file  Occupational History   Occupation: retired  Tobacco Use   Smoking status:  Former    Current packs/day: 0.00    Average packs/day: 0.3 packs/day for 5.0 years (1.5 ttl pk-yrs)    Types: Cigarettes    Start date: 10/02/1981    Quit date: 10/02/1986    Years since quitting: 36.6   Smokeless tobacco: Never  Vaping Use   Vaping status: Never Used  Substance and Sexual Activity   Alcohol use: No   Drug use: No   Sexual activity: Not on file  Other Topics Concern   Not on file  Social History Narrative   Not on file   Social Determinants of Health   Financial Resource Strain: Low Risk  (04/06/2022)   Overall Financial Resource Strain (CARDIA)    Difficulty of Paying Living Expenses: Not hard at all  Food Insecurity: No Food Insecurity (04/06/2022)   Hunger Vital Sign    Worried About Running Out of Food in the Last Year: Never true    Ran Out of Food in the Last Year: Never true  Transportation Needs: No Transportation Needs (04/06/2022)   PRAPARE - Administrator, Civil Service (Medical): No    Lack of Transportation (Non-Medical): No  Physical Activity: Insufficiently  Active (04/06/2022)   Exercise Vital Sign    Days of Exercise per Week: 7 days    Minutes of Exercise per Session: 10 min  Stress: No Stress Concern Present (04/06/2022)   [employer] of Occupational Health - Occupational Stress Questionnaire    Feeling of Stress : Not at all  Social Connections: Moderately Integrated (04/06/2022)   Social Connection and Isolation Panel [NHANES]    Frequency of Communication with Friends and Family: More than three times a week    Frequency of Social Gatherings with Friends and Family: More than three times a week    Attends Religious Services: More than 4 times per year    Active Member of [employer] or Organizations: Yes    Attends Banker Meetings: More than 4 times per year    Marital Status: Widowed  Intimate Partner Violence: Not At Risk (04/06/2022)   Humiliation, Afraid, Rape, and Kick questionnaire    Fear of Current or Ex-Partner:  No    Emotionally Abused: No    Physically Abused: No    Sexually Abused: No    Lab Results  Component Value Date   HGBA1C 9.9 (A) 05/09/2023   Lab Results  Component Value Date   CHOL 105 02/05/2023   Lab Results  Component Value Date   HDL 47.00 02/05/2023   Lab Results  Component Value Date   LDLCALC 41 02/05/2023   Lab Results  Component Value Date   TRIG 89.0 02/05/2023   Lab Results  Component Value Date   CHOLHDL 2 02/05/2023   Lab Results  Component Value Date   CREATININE 2.74 (H) 05/01/2023   Lab Results  Component Value Date   GFR 15.38 (L) 05/01/2023   Lab Results  Component Value Date   MICROALBUR 17.0 (H) 04/12/2016      Component Value Date/Time   NA 133 (L) 05/01/2023 1141   NA 141 08/23/2022 0000   K 4.6 05/01/2023 1141   CL 96 05/01/2023 1141   CO2 25 05/01/2023 1141   GLUCOSE 471 (H) 05/01/2023 1141   BUN 66 (H) 05/01/2023 1141   BUN 46 (A) 08/23/2022 0000   CREATININE 2.74 (H) 05/01/2023 1141   CREATININE 1.74 (H) 08/28/2016 1042   CALCIUM 9.5 05/01/2023 1141   CALCIUM 10.2 07/06/2011 1144   PROT 6.7 05/01/2023 1141   ALBUMIN 3.9 05/01/2023 1141   AST 21 05/01/2023 1141   ALT 35 05/01/2023 1141   ALKPHOS 102 05/01/2023 1141   BILITOT 0.8 05/01/2023 1141   GFRNONAA 19 (L) 04/27/2023 1908   GFRAA 28 (L) 08/06/2020 1526      Latest Ref Rng & Units 05/01/2023   11:41 AM 04/27/2023    7:08 PM 02/05/2023    9:21 AM  BMP  Glucose 70 - 99 mg/dL 784  696  295   BUN 6 - 23 mg/dL 66  55  34   Creatinine 0.40 - 1.20 mg/dL 2.84  1.32  4.40   Sodium 135 - 145 mEq/L 133  133  142   Potassium 3.5 - 5.1 mEq/L 4.6  5.4  3.8   Chloride 96 - 112 mEq/L 96  102  104   CO2 19 - 32 mEq/L 25  19  24    Calcium 8.4 - 10.5 mg/dL 9.5  9.2  9.7        Component Value Date/Time   WBC 11.0 (H) 05/01/2023 1141   RBC 5.22 (H) 05/01/2023 1141   HGB 14.3 05/01/2023 1141  HGB 11.0 (L) 07/28/2021 0852   HCT 43.2 05/01/2023 1141   HCT 33.0 (L)  07/28/2021 0852   PLT 133.0 (L) 05/01/2023 1141   PLT 162 07/28/2021 0852   MCV 82.9 05/01/2023 1141   MCV 80 07/28/2021 0852   MCH 27.4 04/27/2023 1908   MCHC 33.2 05/01/2023 1141   RDW 17.2 (H) 05/01/2023 1141   RDW 15.6 (H) 07/28/2021 0852   LYMPHSABS 1.0 05/01/2023 1141   MONOABS 0.8 05/01/2023 1141   EOSABS 0.0 05/01/2023 1141   BASOSABS 0.0 05/01/2023 1141     Parts of this note may have been dictated using voice recognition software. There may be variances in spelling and vocabulary which are unintentional. Not all errors are proofread. Please notify the Thereasa Parkin if any discrepancies are noted or if the meaning of any statement is not clear.

## 2023-05-09 NOTE — Patient Instructions (Signed)

## 2023-05-15 DIAGNOSIS — E872 Acidosis, unspecified: Secondary | ICD-10-CM | POA: Diagnosis not present

## 2023-05-15 DIAGNOSIS — E1122 Type 2 diabetes mellitus with diabetic chronic kidney disease: Secondary | ICD-10-CM | POA: Diagnosis not present

## 2023-05-15 DIAGNOSIS — I129 Hypertensive chronic kidney disease with stage 1 through stage 4 chronic kidney disease, or unspecified chronic kidney disease: Secondary | ICD-10-CM | POA: Diagnosis not present

## 2023-05-15 DIAGNOSIS — D509 Iron deficiency anemia, unspecified: Secondary | ICD-10-CM | POA: Diagnosis not present

## 2023-05-15 DIAGNOSIS — N184 Chronic kidney disease, stage 4 (severe): Secondary | ICD-10-CM | POA: Diagnosis not present

## 2023-05-15 DIAGNOSIS — N2581 Secondary hyperparathyroidism of renal origin: Secondary | ICD-10-CM | POA: Diagnosis not present

## 2023-05-15 DIAGNOSIS — E785 Hyperlipidemia, unspecified: Secondary | ICD-10-CM | POA: Diagnosis not present

## 2023-05-15 DIAGNOSIS — I5032 Chronic diastolic (congestive) heart failure: Secondary | ICD-10-CM | POA: Diagnosis not present

## 2023-05-16 ENCOUNTER — Ambulatory Visit (INDEPENDENT_AMBULATORY_CARE_PROVIDER_SITE_OTHER): Payer: 59 | Admitting: Internal Medicine

## 2023-05-16 ENCOUNTER — Encounter: Payer: Self-pay | Admitting: Internal Medicine

## 2023-05-16 VITALS — BP 132/76 | HR 74 | Temp 98.7°F | Ht 65.0 in | Wt 168.8 lb

## 2023-05-16 DIAGNOSIS — R0609 Other forms of dyspnea: Secondary | ICD-10-CM

## 2023-05-16 DIAGNOSIS — N183 Chronic kidney disease, stage 3 unspecified: Secondary | ICD-10-CM | POA: Diagnosis not present

## 2023-05-16 DIAGNOSIS — J45991 Cough variant asthma: Secondary | ICD-10-CM | POA: Diagnosis not present

## 2023-05-16 LAB — BASIC METABOLIC PANEL
BUN: 53 mg/dL — ABNORMAL HIGH (ref 6–23)
CO2: 25 mEq/L (ref 19–32)
Calcium: 9.2 mg/dL (ref 8.4–10.5)
Chloride: 96 mEq/L (ref 96–112)
Creatinine, Ser: 2.82 mg/dL — ABNORMAL HIGH (ref 0.40–1.20)
GFR: 14.85 mL/min — CL (ref >60.00)
Glucose, Bld: 234 mg/dL — ABNORMAL HIGH (ref 70–99)
Potassium: 4.4 mEq/L (ref 3.5–5.1)
Sodium: 134 mEq/L — ABNORMAL LOW (ref 135–145)

## 2023-05-16 LAB — LAB REPORT - SCANNED
Albumin, Urine POC: 5.7
Albumin/Creatinine Ratio, Urine, POC: 7
Creatinine, POC: 81.1 mg/dL
EGFR: 16

## 2023-05-16 LAB — CBC WITH DIFFERENTIAL/PLATELET
Basophils Absolute: 0 10*3/uL (ref 0.0–0.1)
Basophils Relative: 0.7 % (ref 0.0–3.0)
Eosinophils Absolute: 0.1 10*3/uL (ref 0.0–0.7)
Eosinophils Relative: 1.7 % (ref 0.0–5.0)
HCT: 41.7 % (ref 36.0–46.0)
Hemoglobin: 13.9 g/dL (ref 12.0–15.0)
Lymphocytes Relative: 20 % (ref 12.0–46.0)
Lymphs Abs: 1.4 10*3/uL (ref 0.7–4.0)
MCHC: 33.3 g/dL (ref 30.0–36.0)
MCV: 82.9 fl (ref 78.0–100.0)
Monocytes Absolute: 0.7 10*3/uL (ref 0.1–1.0)
Monocytes Relative: 9.7 % (ref 3.0–12.0)
Neutro Abs: 4.7 10*3/uL (ref 1.4–7.7)
Neutrophils Relative %: 67.9 % (ref 43.0–77.0)
Platelets: 110 10*3/uL — ABNORMAL LOW (ref 150.0–400.0)
RBC: 5.04 Mil/uL (ref 3.87–5.11)
RDW: 17.2 % — ABNORMAL HIGH (ref 11.5–15.5)
WBC: 6.9 10*3/uL (ref 4.0–10.5)

## 2023-05-16 LAB — TSH: TSH: 3 u[IU]/mL (ref 0.35–5.50)

## 2023-05-16 LAB — BRAIN NATRIURETIC PEPTIDE: Pro B Natriuretic peptide (BNP): 214 pg/mL — ABNORMAL HIGH (ref 0.0–100.0)

## 2023-05-16 LAB — SEDIMENTATION RATE: Sed Rate: 1 mm/hr (ref 0–30)

## 2023-05-16 MED ORDER — METHYLPREDNISOLONE ACETATE 80 MG/ML IJ SUSP
120.0000 mg | Freq: Once | INTRAMUSCULAR | Status: AC
Start: 2023-05-16 — End: 2023-05-16
  Administered 2023-05-16: 120 mg via INTRAMUSCULAR

## 2023-05-16 NOTE — Assessment & Plan Note (Addendum)
Lab Results  Component Value Date   CREATININE 2.82 (H) 05/16/2023   CREATININE 2.74 (H) 05/01/2023   CREATININE 2.48 (H) 04/27/2023    F/u advised, not on any nephrotoxins, appears euvolemic >>> no  change rx

## 2023-05-16 NOTE — Progress Notes (Signed)
Remote pacemaker transmission.   

## 2023-05-16 NOTE — Assessment & Plan Note (Addendum)
PFT's 03/18/08 minimal airflow obstruction  -PFT's January 04, 2010 No sign airflow obstruction   - FENO 01/25/2017  =   23 p am symb 160 x 2 pffs - Spirometry 01/25/2017  wnl x for min curvature on f/v p am symb 160 x 2 / no saba prior - 07/30/2017   After extensive coaching HFA effectiveness =    75% from baseline 50%  - Allergy profile 07/30/17  >  Eos 0.2 /  IgE  145  RAST pos mold  - FENO 01/28/2018  =   17 - Spirometry 01/28/2018  FEV1 1.22 (65%)  Ratio 79  - 05/16/2023  After extensive coaching inhaler device,  effectiveness =    80% from a baseline of 60%    DDX of  difficult airways management almost all start with A and  include Adherence, Ace Inhibitors, Acid Reflux, Active Sinus Disease, Alpha 1 Antitripsin deficiency, Anxiety masquerading as Airways dz,  ABPA,  Allergy(esp in young), Aspiration (esp in elderly), Adverse effects of meds,  Active smoking or vaping, A bunch of PE's (a small clot burden can't cause this syndrome unless there is already severe underlying pulm or vascular dz with poor reserve) plus two Bs  = Bronchiectasis and Beta blocker use..and one C= CHF   Adherence is always the initial "prime suspect" and is a multilayered concern that requires a "trust but verify" approach in every patient - starting with knowing how to use medications, especially inhalers, correctly, keeping up with refills and understanding the fundamental difference between maintenance and prns vs those medications only taken for a very short course and then stopped and not refilled.  - see hfa teaching - return with all meds in hand using a trust but verify approach to confirm accurate Medication  Reconciliation The principal here is that until we are certain that the  patients are doing what we've asked, it makes no sense to ask them to do more.   ? Acid (or non-acid) GERD > always difficult to exclude as up to 75% of pts in some series report no assoc GI/ Heartburn symptoms and se clearly has esophagitis  on CT plus HH> rec max (24h)  acid suppression and diet restrictions/ reviewed and instructions given in writing.   ? Allergy > depomedrol 120 mg IM / continue symbicot  ? Adverse drug effects > none of the usual suspects listed   ? A bunch of PE's > unlikely on eliquis   ? BB effects. Unlikely on such low doses of eliquis   ? Bronchiectasis component > max mucined xm/ flutter valve reviewed   ? CHF / vol overload related to CRI > difficult challenge but he bp is ok today and her bnp is only up a bit so no changes needed for now but f/u closely planned.   Chest tightness unlikely to be cardiac as reports better when walks, worse at rest and feels like an upper abd source c/w gastritis or GERD rather than angina  F/u here in 4 weeks with all meds in hand using a trust but verify approach to confirm accurate Medication  Reconciliation The principal here is that until we are certain that the  patients are doing what we've asked, it makes no sense to ask them to do more.         Each maintenance medication was reviewed in detail including emphasizing most importantly the difference between maintenance and prns and under what circumstances the prns are to be triggered using  an action plan format where appropriate.  Total time for H and P, chart review, counseling, reviewing hfa/neb/flutter device(s) and generating customized AVS unique to this office visit / same day charting = > 45 min for   refractory respiratory  symptoms of uncertain etiology

## 2023-05-16 NOTE — Patient Instructions (Addendum)
Plan A = Automatic = Always=    Symbicort 160 Take 2 puffs first thing in am and then another 2 puffs about 12 hours later.    Work on inhaler technique:  relax and gently blow all the way out then take a nice smooth full deep breath back in, triggering the inhaler at same time you start breathing in.  Hold breath in for at least  5 seconds if you can. Blow out symbicort  thru nose. Rinse and gargle with water when done.  If mouth or throat bother you at all,  try brushing teeth/gums/tongue with arm and hammer toothpaste/ make a slurry and gargle and spit out.     Plan B = Backup (to supplement plan A, not to replace it) Only use your albuterol inhaler as a rescue medication to be used if you can't catch your breath by resting or doing a relaxed purse lip breathing pattern.  - The less you use it, the better it will work when you need it. - Ok to use the inhaler up to 2 puffs  every 4 hours if you must but call for appointment if use goes up over your usual need - Don't leave home without it !!  (think of it like the spare tire for your car)   Plan C = Crisis (instead of Plan B but only if Plan B stops working) - only use your albuterol nebulizer if you first try Plan B and it fails to help > ok to use the nebulizer up to every 4 hours but if start needing it regularly call for immediate appointment  Continue pantoprazole 40 mg Take 30- 60 min before your first and last meals of the day and pepcid 20 mg one at bedtime   GERD (REFLUX)  is an extremely common cause of respiratory symptoms just like yours , many times with no obvious heartburn at all.    It can be treated with medication, but also with lifestyle changes including elevation of the head of your bed (ideally with 6 -8inch blocks under the headboard of your bed),  Smoking cessation, avoidance of late meals, excessive alcohol, and avoid fatty foods, chocolate, peppermint, colas, red wine, and acidic juices such as orange juice.  NO MINT  OR MENTHOL PRODUCTS SO NO COUGH DROPS - Lunden's ok USE SUGARLESS CANDY INSTEAD (Jolley ranchers or Stover's or Life Savers) or even ice chips will also do - the key is to swallow to prevent all throat clearing. NO OIL BASED VITAMINS - use powdered substitutes.  Avoid fish oil when coughing.   For cough/ congestion > mucinex or mucinex dm  up to maximum of  1200 mg every 12 hours and use the flutter valve as much as you can    Depomedrol 120 mg IM   Please schedule a follow up office visit in 6 weeks, call sooner if needed with all medications /inhalers/ solutions in hand so we can verify exactly what you are taking. This includes all medications from all doctors and over the counters - PLEASE separate them into two bags:  the ones you take automatically, no matter what, vs the ones you take just when you feel you need them "BAG #2 is UP TO YOU"  - this will really help [place] help you take your medications more effectively.

## 2023-05-16 NOTE — Progress Notes (Signed)
Patient ID: [name], female   DOB: 04/28/1936 .   MRN: [id number]  Brief patient profile:  87 yobf quit smoking 1988 with h/o asthma  And nl baseline pfts ( as of 01/04/2010) who had been using Advair on a p.r.n. basis and noticing increasing dyspnea and need for rescue therapy x one mo when seen 01/06/08 for pulmonary evaluation so Advair stopped. Began Symbicort 160/4.34mcg 2 puffs two times a day.  Returned 02/13/08 improved with decreased dyspnea and no rescue use   Returned 03/18/08 symptom free no rescue needed, stopped reglan, no flare   05/04/2020  f/u ov/ re: asthma/ rhinitis not sure about ppi/ using benadryl helps some  Chief Complaint  Patient presents with   Follow-up    Increased SOB and chest tightness over the past month. She has some cough- prod with min light yellow sputum. She is using her albuterol inhaler about 2 x per day.   Dyspnea:  MMRC2 = can't walk a nl pace on a flat grade s sob but does fine slow and flat  Cough: more cough assoc with pnds day > noct/ not taking omeprazole  Sleeping: 2 pillows ok  SABA use: 2x daily  02: none  rec Plan A = Automatic = Always=    Symbicort 160 Take 2 puffs first thing in am and then another 2 puffs about 12 hours later.  Work on inhaler technique:    Omerprazole 40 mg Take 30-60 min before first meal of the day  Prednisone 10 mg take  4 each am x 2 days,   2 each am x 2 days,  1 each am x 2 days and stop  Plan B = Backup (to supplement plan A, not to replace it) Only use your albuterol inhaler as a rescue medication         07/04/2021  f/u ov/ re: cough variant asthma   maint on symbicort 160 2bid/ singulair  - no longer on gerd rx  Chief Complaint  Patient presents with   Follow-up    Still coughing some   Dyspnea:  baseline still MMRC2 = can't walk a nl pace on a flat grade s sob but does fine slow and flat eg Mall walking  Cough: worse x one week, slt yellow not on gerd rx  Sleeping: flat bed/ 2 pillows- no  noct symptoms SABA use: started up using it one week prior to OV  twice daily  02: none  Covid status:   2 vax / never got covid  Rec At onset cough the 1st thing you should do :  Try prilosec otc 20mg   Take 30-60 min before first meal of the day and Pepcid ac (famotidine) 20 mg one @  bedtime until cough is completely gone   Prednisone 10 mg take  4 each am x 2 days,   2 each am x 2 days,  1 each am x 2 days and stop  Zpak   12/28/21 LHC/RHC   Mid RCA lesion is 30% stenosed.   Dist RCA lesion is 50% stenosed.   1st Diag lesion is 90% stenosed.   Prox LAD to Mid LAD lesion is 95% stenosed.   A drug-eluting stent was successfully placed using a SYNERGY XD 3.0X16.   Balloon angioplasty was performed using a BALLN SAPPHIRE 2.0X12.   Post intervention, there is a 0% residual stenosis.   Post intervention, there is a 30% residual stenosis.   Severe proximal LAD stenosis involving  a moderate caliber diagonal branch.  Successful PTCA/DES x 1 proximal LAD Successful balloon angioplasty Diagonal 1 No obstructive disease in the Circumflex artery Moderate non-obstructive disease in the dominant RCA Elevated right and left heart pressures. (Wedge 27)     01/25/2022  f/u ov/ re: cough variant asthma  maint on symbicort 160 /singulair   Chief Complaint  Patient presents with   Follow-up    Patient is having shortness of breath and weakness over the last month. She was admitted into the hospital in March for heart attack. Productive/dry cough with yellow sputum. Patient currently on prednisone.   Dyspnea:  very sedentary since heart cath above  Cough: sporadic / rattles/ slt yellow mucus Sleeping: flat 2 pillows bed once a week needs at third pillow but no albuterol SABA use: until 01/24/22 did not have any / then neb added and took am 2 h prior to OV   02: none  Covid status:  vax x 2, never covid infection  Rec Plan A = Automatic = Always=    Symbicort 160 (Breztri) Take 2 puffs first  thing in am and then another 2 puffs about 12 hours later.  Work on inhaler technique:   Plan B = Backup (to supplement plan A, not to replace it) Only use your albuterol inhaler as a rescue medication Plan C = Crisis (instead of Plan B but only if Plan B stops working) - only use your albuterol nebulizer if you first try Plan B  At onset cough (if you are not already on Protonix which should be twice daily 30 min before meals) the 1st thing you should do :  Try prilosec otc 20mg   Take 30-60 min before first meal of the day and Pepcid ac (famotidine) 20 mg one @  bedtime until cough is completely gone   Zpak  Please remember to go to the  x-ray department : cxr ok    07/31/2022  f/u ov/ re: cough variant asthma  maint on symbicort   No chief complaint on file.  Dyspnea:  can do belks pushing the cart with HC parking  Cough: sporadic Sleeping: flat bed / 2 pillows  SABA use: once or twice a week 02: none  Rec No change rx  Onset around middle July 2024 bad cough July 19 zpak then prednisone    NP eval 05/03/23  Continue Albuterol inhaler 2 puffs or 3 mL neb every 6 hours as needed for shortness of breath or wheezing. Notify if symptoms persist despite rescue inhaler/neb use.    Use your nebs three times a day then follow with flutter valve ten times  Continue Symbicort 2 puffs Twice daily. Brush tongue and rinse mouth afterwards Continue zyrtec 1 tab daily for allergies Continue singulair 1 tab At bedtime for allergies/asthma   -Prednisone taper. 3 tabs for 3 days, 2 tabs for 3 days, then 1 tab for 3 days, then stop. Take in AM with food -Doxycycline 1 tab Twice daily for 10 days. Take with food.  -Promethazine DM cough syrup 5 mL every 8 hours as needed for cough. May cause drowsiness. Do not drive after taking -Benzonatate 1 capsule Three times a day as needed for cough -Saline nasal rinses twice a day until sinus symptoms resolve. Use bottled distilled water and sodium chloride  packets for these. Follow 20-30 minutes later with flonase nasal spray 2 sprays each nostril in the morning  -Guaifenesin (mucinex) [phone] mg Twice daily for cough/congestion    05/16/2023  f/u ov/ re: bad cough since mid July 2024 assoc  recurrent chest tightness maint on symbicort 160 Take 2 puffs first thing in am and then another 2 puffs about 12 hours later.  Chief Complaint  Patient presents with   Follow-up    C/o some chest tightness.   Dyspnea: chest tightness x weeks at rest, less noticeable with walking and ? Some better p neb  - does not correlate with nausea which started p doxy rx  Cough: dry cough Lynett Grimes her up most nights ? Using mucinex dm/ flutter valve as rec/ not using flutter   Sleeping: flat bed 2 pillows  SABA use: not using hfa at all  / neb 2-3 x daily  02: none      No obvious day to day or daytime variability or assoc excess/ purulent sputum or mucus plugs or hemoptysis or cp or  subjective wheeze or overt sinus or hb symptoms.    Also denies any obvious fluctuation of symptoms with weather or environmental changes or other aggravating or alleviating factors except as outlined above   No unusual exposure hx or h/o childhood pna/ asthma or knowledge of premature birth.  Current Allergies, Complete Past Medical History, Past Surgical History, Family History, and Social History were reviewed in [employer] record.  ROS  The following are not active complaints unless bolded Hoarseness, sore throat, dysphagia, dental problems, itching, sneezing,  nasal congestion or discharge of excess mucus or purulent secretions, ear ache,   fever, chills, sweats, unintended wt loss or wt gain, classically pleuritic or exertional cp,  orthopnea pnd or arm/hand swelling  or leg swelling, presyncope, palpitations, abdominal pain, anorexia, nausea(p doxy) , vomiting, diarrhea  or change in bowel habits or change in bladder habits, change in stools or change  in urine, dysuria, hematuria,  rash, arthralgias, visual complaints, headache, numbness, weakness or ataxia or problems with walking or coordination,  change in mood or  memory.        Current Meds- - NOTE:   Unable to verify as accurately reflecting what pt takes    Medication Sig   acetaminophen (TYLENOL) 325 MG tablet Take 650 mg by mouth every 6 (six) hours as needed for pain.   albuterol (PROAIR HFA) 108 (90 Base) MCG/ACT inhaler 2 puffs every 4 hours as needed only  if your can't catch your breath   albuterol (PROVENTIL) (2.5 MG/3ML) 0.083% nebulizer solution Take 3 mLs (2.5 mg total) by nebulization every 6 (six) hours as needed for wheezing or shortness of breath.   Ascorbic Acid (VITAMIN C) 1000 MG tablet Take 1,000 mg by mouth daily.   atorvastatin (LIPITOR) 80 MG tablet Take 1 tablet (80 mg total) by mouth daily.   BD VEO INSULIN SYRINGE U/F 31G X 15/64" 0.5 ML MISC USE AS INSTRUCTED   benzonatate (TESSALON) 200 MG capsule Take 1 capsule (200 mg total) by mouth 3 (three) times daily as needed.   Blood Glucose Monitoring Suppl (ONETOUCH VERIO FLEX SYSTEM) w/Device KIT USE AS DIRECTED   budesonide-formoterol (SYMBICORT) 160-4.5 MCG/ACT inhaler TAKE 2 PUFFS FIRST THING IN AM AND THEN ANOTHER 2 PUFFS ABOUT 12 HOURS LATER.   carvedilol (COREG) 3.125 MG tablet Take 1 tablet (3.125 mg total) by mouth 2 (two) times daily with a meal.   cetirizine (ZYRTEC ALLERGY) 10 MG tablet Take 1 tablet (10 mg total) by mouth daily as needed for allergies.   cholecalciferol (VITAMIN D3) 25 MCG (1000 UNIT) tablet Take 1,000 Units  by mouth daily. Isn't taking regularly   clopidogrel (PLAVIX) 75 MG tablet TAKE 1 TABLET BY MOUTH DAILY WITH BREAKFAST.   diclofenac Sodium (VOLTAREN) 1 % GEL Apply 4 g topically 4 (four) times daily as needed (pain).   ELIQUIS 2.5 MG TABS tablet TAKE 1 TABLET BY MOUTH TWICE A DAY   famotidine (PEPCID) 20 MG tablet Take 1 tablet (20 mg total) by mouth 2 (two) times daily.   FARXIGA  5 MG TABS tablet TAKE 1 TABLET BY MOUTH EVERY DAY BEFORE BREAKFAST   fluticasone (FLONASE) 50 MCG/ACT nasal spray Place 2 sprays into both nostrils daily.   furosemide (LASIX) 40 MG tablet Take 1 tablet (40 mg total) by mouth 2 (two) times daily.   glucose blood (ONETOUCH VERIO) test strip 1 each by Other route 2 (two) times daily. And lancets 2/day   glucose blood test strip Check blood sugar twice a day   hydrALAZINE (APRESOLINE) 25 MG tablet TAKE 3 TABLETS (75 MG TOTAL) BY MOUTH 3 TIMES A DAY   insulin lispro (HUMALOG) 100 UNIT/ML injection Give 3 units with BREAKFAST, AND 18 units with SUPPER   latanoprost (XALATAN) 0.005 % ophthalmic solution Place 1 drop into both eyes at bedtime.   montelukast (SINGULAIR) 10 MG tablet TAKE 1 TABLET BY MOUTH EVERYDAY AT BEDTIME   nitroGLYCERIN (NITROSTAT) 0.4 MG SL tablet Place 1 tablet (0.4 mg total) under the tongue every 5 (five) minutes x 3 doses as needed for chest pain.   ondansetron (ZOFRAN) 8 MG tablet Take 1 tablet (8 mg total) by mouth every 8 (eight) hours as needed for nausea or vomiting.   pantoprazole (PROTONIX) 40 MG tablet TAKE 1 TABLET BY MOUTH TWICE A DAY   potassium chloride (KLOR-CON) 10 MEQ tablet TAKE 1 TABLET BY MOUTH EVERY DAY   promethazine-dextromethorphan (PROMETHAZINE-DM) 6.25-15 MG/5ML syrup Take 5 mLs by mouth every 8 (eight) hours as needed for cough.   sodium bicarbonate 650 MG tablet Take 650 mg by mouth 2 (two) times daily.   Syringe/Needle, Disp, (SYRINGE 3CC/25GX1") 25G X 1" 3 ML MISC Use to inject into the skin 2x a day   vitamin B-12 (CYANOCOBALAMIN) 100 MCG tablet Take 100 mcg by mouth daily.   Current Facility-Administered Medications for the 05/16/23 encounter (Office Visit) with Nyoka Cowden, MD  Medication   ipratropium-albuterol (DUONEB) 0.5-2.5 (3) MG/3ML nebulizer solution 3 mL         Past Medical History:   OSTEOPOROSIS (ICD-733.00)  HYPERTENSION (ICD-401.9)  HYPERLIPIDEMIA (ICD-272.4)   DIVERTICULITIS, HX OF (ICD-V12.79)  DIABETES MELLITUS, TYPE II (ICD-250.00)  COLON CANCER, HX OF (ICD-V10.05)  ASTHMA (ICD-493.90)  -PFT's 03/18/08 minimal airflow obstruction  -PFT's January 04, 2010 No sign airflow obstruction  -Mastered HFA technique August 13, 2008 > confimed November 23, 2009  ANXIETY (ICD-300.00)  ANEMIA-IRON DEFICIENCY (ICD-280.9)  ALLERGIC RHINITIS (ICD-477.9)  IBS  GERD            Objective:   Physical Exam  Wts   05/16/2023    168  07/31/2022  173  01/25/2022    167   07/04/2021    182 11/04/2020      181 05/04/2020      185 05/05/2019      196  wt 202 November 23, 2009> 199 January 04, 2010 > 209  03/22/11 > 214 10/16/2013 >  01/25/2017   193> 01/28/2018   200   Vital signs reviewed  05/16/2023  - Note at rest 02 sats  100% on  RA   General appearance:    amb elderly bf easily confused with details of care   HEENT : Oropharynx  clear      Nasal turbinates nl    NECK :  without  apparent JVD/ palpable Nodes/TM    LUNGS: no acc muscle use,  Nl contour chest with pan exp wheeze/ component of pseudowhweeze during coughing fits on exp maneuvers    CV:  RRR  no s3 or murmur or increase in P2, and no edema   ABD:  soft and nontender with nl inspiratory excursion in the supine position. No bruits or organomegaly appreciated   MS:  Nl gait/ ext warm without deformities Or obvious joint restrictions  calf tenderness, cyanosis or clubbing    SKIN: warm and dry without lesions    NEURO:  alert, approp, nl sensorium with  no motor or cerebellar deficits apparent.        I personally reviewed images and agree with radiology impression as follows:   Chest CT w/o contrast   04/27/23   1. Bronchial wall thickening and mucous plugging in the lower lobes greatest in the left lower lobe. 2. Small hiatal hernia with mild wall thickening of the lower esophagus suggesting esophagitis.     Labs ordered/ reviewed:      Chemistry      Component Value Date/Time    NA 134 (L) 05/16/2023 0930   NA 141 08/23/2022 0000   K 4.4 05/16/2023 0930   CL 96 05/16/2023 0930   CO2 25 05/16/2023 0930   BUN 53 (H) 05/16/2023 0930   BUN 46 (A) 08/23/2022 0000   CREATININE 2.82 (H) 05/16/2023 0930   CREATININE 1.74 (H) 08/28/2016 1042   GLU 129 08/23/2022 0000      Component Value Date/Time   CALCIUM 9.2 05/16/2023 0930   CALCIUM 10.2 07/06/2011 1144   ALKPHOS 102 05/01/2023 1141   AST 21 05/01/2023 1141   ALT 35 05/01/2023 1141   BILITOT 0.8 05/01/2023 1141     Lab Results  Component Value Date   CREATININE 2.82 (H) 05/16/2023   CREATININE 2.74 (H) 05/01/2023   CREATININE 2.48 (H) 04/27/2023       Lab Results  Component Value Date   WBC 6.9 05/16/2023   HGB 13.9 05/16/2023   HCT 41.7 05/16/2023   MCV 82.9 05/16/2023   PLT 110.0 (L) 05/16/2023     Lab Results  Component Value Date   DDIMER 0.40 05/16/2023      Lab Results  Component Value Date   TSH 3.00 05/16/2023     Lab Results  Component Value Date   PROBNP 214.0 (H) 05/16/2023       Lab Results  Component Value Date   ESRSEDRATE 1 05/16/2023   ESRSEDRATE 1 01/12/2021   ESRSEDRATE 3 12/22/2009            Assessment:

## 2023-05-17 ENCOUNTER — Encounter: Payer: Self-pay | Admitting: Internal Medicine

## 2023-05-18 ENCOUNTER — Other Ambulatory Visit: Payer: Self-pay

## 2023-05-18 DIAGNOSIS — R768 Other specified abnormal immunological findings in serum: Secondary | ICD-10-CM

## 2023-05-18 LAB — TROPONIN I: Troponin I: 27 ng/L (ref <=47)

## 2023-05-18 LAB — IGE: IgE (Immunoglobulin E), Serum: 919 kU/L — ABNORMAL HIGH (ref <=114)

## 2023-05-18 LAB — D-DIMER, QUANTITATIVE: D-Dimer, Quant: 0.4 ug{FEU}/mL (ref <0.50)

## 2023-05-18 NOTE — Progress Notes (Signed)
Called patient.  Reviewed lab work results per Dr. Sherene Sires.  Patient is agreeable with being referred to allergist.  The provider that prescribes her Lasix is her cardiologist, Dietrich Pates, MD.  Will route this message to Dr. Tenny Craw and put orders only in for allergy referral.  Patient verbalized understanding.

## 2023-05-18 NOTE — Progress Notes (Signed)
Call patient :  Studies are suggestive of asthma and we need to   1) refer to Allergist if not already seeing one  Refer to allergist.

## 2023-05-21 NOTE — Progress Notes (Signed)
Spoke with pt and notified of results per Dr. Sherene Sires. Pt verbalized understanding and denied any questions. Copy routed to Dr Tenny Craw. Pt aware to f/u with cards.

## 2023-05-24 NOTE — Telephone Encounter (Signed)
Spoke with pt, pt states she prefers to speak with PCP.

## 2023-05-24 NOTE — Telephone Encounter (Signed)
Can you check on her regarding her appointment for tomorrow? Dr. Sherene Sires actually put the referral in for her and it looks like asthma/ allergy group has reached out to her try and schedule. I am always happy to see her but I know it's not easy for her and her daughter to get to the office and I didn't want to waste their time.   From Mychart message for her--   We have received a referral from Sandrea Hughs, MD  for you to be evaluated.  If you are interested in scheduling with [place] here at [facility], please call our office at [phone].  We look forward to taking care of your healthcare needs.

## 2023-05-25 ENCOUNTER — Ambulatory Visit: Payer: 59 | Admitting: Family

## 2023-05-25 ENCOUNTER — Encounter: Payer: Self-pay | Admitting: Family

## 2023-05-25 VITALS — BP 128/70 | HR 70 | Resp 18 | Ht 65.0 in | Wt 175.0 lb

## 2023-05-25 DIAGNOSIS — R1310 Dysphagia, unspecified: Secondary | ICD-10-CM | POA: Diagnosis not present

## 2023-05-25 NOTE — Progress Notes (Signed)
[name] is a 87 y.o. female with the following history as recorded in Epic[employer]:  Patient Active Problem List   Diagnosis Date Noted   Acute bronchitis 05/03/2023   Acute bacterial rhinosinusitis 05/03/2023   CKD (chronic kidney disease) stage 4, GFR 15-29 ml/min (HCC) 06/27/2022   Mitral stenosis 04/17/2022   Acute kidney injury superimposed on chronic kidney disease (HCC) 01/02/2022   NSTEMI (non-ST elevated myocardial infarction) (HCC) 12/23/2021   Osteoarthritis of knee 03/30/2021   Bilateral hand pain 03/08/2021   Complete heart block (HCC) 12/13/2020   Pacemaker - MDT 12/13/2020   DOE (dyspnea on exertion) 07/29/2020   Encntr for surgical aftcr following surgery on the circ sys 07/26/2020   Pain in right knee 03/17/2020   Chronic combined systolic (congestive) and diastolic (congestive) heart failure (HCC)    DKA (diabetic ketoacidoses) 12/20/2019   Diverticulosis 12/10/2019   Age-related osteoporosis without current pathological fracture 10/03/2019   Chronic kidney disease, stage 3 unspecified (HCC) 10/03/2019   Long term (current) use of insulin (HCC) 10/03/2019   Hyperlipidemia, unspecified 10/03/2019   Shortness of breath 01/30/2018   Acute bronchiolitis 12/17/2017   Wheezing 10/29/2017   Vitamin D deficiency 07/31/2017   Renal insufficiency 07/31/2017   Knee pain 06/01/2017   Morbid obesity due to excess calories (HCC) 01/25/2017   Diabetes (HCC) 04/12/2016   Cough 01/20/2015   Atrial fibrillation (HCC) 05/28/2014   Personal history of colonic polyps 05/28/2014   Vomiting 01/29/2014   CAD (coronary artery disease) 09/01/2013   Routine general medical examination at a health care facility 07/17/2011   Encounter for long-term (current) use of other medications 07/06/2011   Nonspecific (abnormal) findings on radiological and other examination of body structure 11/09/2009   IRRITABLE BOWEL SYNDROME 05/07/2009   CHEST PAIN 05/07/2009   ADNEXAL MASS, RIGHT  07/22/2008   HEMORRHOIDS, RECURRENT 01/27/2008   Diverticulitis 01/27/2008   OTH ABNORMAL FIND RAD EXAMINATION BREAST 01/27/2008   GERD 01/13/2008   UTI 09/28/2007   Dyslipidemia 05/27/2007   ANEMIA-IRON DEFICIENCY 05/27/2007   ANXIETY 05/27/2007   Essential hypertension 05/27/2007   Seasonal allergic rhinitis 05/27/2007   Cough variant asthma 05/27/2007   Osteoporosis 05/27/2007   DIVERTICULITIS, HX OF 05/27/2007    Current Outpatient Medications  Medication Sig Dispense Refill   acetaminophen (TYLENOL) 325 MG tablet Take 650 mg by mouth every 6 (six) hours as needed for pain.     albuterol (PROAIR HFA) 108 (90 Base) MCG/ACT inhaler 2 puffs every 4 hours as needed only  if your can't catch your breath 18 g 11   albuterol (PROVENTIL) (2.5 MG/3ML) 0.083% nebulizer solution Take 3 mLs (2.5 mg total) by nebulization every 6 (six) hours as needed for wheezing or shortness of breath. 150 mL 1   Ascorbic Acid (VITAMIN C) 1000 MG tablet Take 1,000 mg by mouth daily.     atorvastatin (LIPITOR) 80 MG tablet Take 1 tablet (80 mg total) by mouth daily. 90 tablet 3   BD VEO INSULIN SYRINGE U/F 31G X 15/64" 0.5 ML MISC USE AS INSTRUCTED 100 each 3   benzonatate (TESSALON) 200 MG capsule Take 1 capsule (200 mg total) by mouth 3 (three) times daily as needed. 30 capsule 1   Blood Glucose Monitoring Suppl (ONETOUCH VERIO FLEX SYSTEM) w/Device KIT USE AS DIRECTED 1 kit .   budesonide-formoterol (SYMBICORT) 160-4.5 MCG/ACT inhaler TAKE 2 PUFFS FIRST THING IN AM AND THEN ANOTHER 2 PUFFS ABOUT 12 HOURS LATER. 30.6 each 6   carvedilol (  COREG) 3.125 MG tablet Take 1 tablet (3.125 mg total) by mouth 2 (two) times daily with a meal. 180 tablet 1   cetirizine (ZYRTEC ALLERGY) 10 MG tablet Take 1 tablet (10 mg total) by mouth daily as needed for allergies.     cholecalciferol (VITAMIN D3) 25 MCG (1000 UNIT) tablet Take 1,000 Units by mouth daily. Isn't taking regularly     clopidogrel (PLAVIX) 75 MG tablet TAKE 1  TABLET BY MOUTH DAILY WITH BREAKFAST. 90 tablet 2   diclofenac Sodium (VOLTAREN) 1 % GEL Apply 4 g topically 4 (four) times daily as needed (pain).     ELIQUIS 2.5 MG TABS tablet TAKE 1 TABLET BY MOUTH TWICE A DAY 60 tablet 5   famotidine (PEPCID) 20 MG tablet Take 1 tablet (20 mg total) by mouth 2 (two) times daily. 180 tablet 1   FARXIGA 5 MG TABS tablet TAKE 1 TABLET BY MOUTH EVERY DAY BEFORE BREAKFAST 30 tablet 3   fluticasone (FLONASE) 50 MCG/ACT nasal spray Place 2 sprays into both nostrils daily. 18.2 mL 2   furosemide (LASIX) 40 MG tablet Take 1 tablet (40 mg total) by mouth 2 (two) times daily. 180 tablet 3   glucose blood (ONETOUCH VERIO) test strip 1 each by Other route 2 (two) times daily. And lancets 2/day 200 each 3   glucose blood test strip Check blood sugar twice a day 100 each 12   hydrALAZINE (APRESOLINE) 25 MG tablet TAKE 3 TABLETS (75 MG TOTAL) BY MOUTH 3 TIMES A DAY 270 tablet 0   insulin lispro (HUMALOG) 100 UNIT/ML injection Give 3 units with BREAKFAST, AND 18 units with SUPPER 60 mL 1   latanoprost (XALATAN) 0.005 % ophthalmic solution Place 1 drop into both eyes at bedtime.     montelukast (SINGULAIR) 10 MG tablet TAKE 1 TABLET BY MOUTH EVERYDAY AT BEDTIME 90 tablet 1   nitroGLYCERIN (NITROSTAT) 0.4 MG SL tablet Place 1 tablet (0.4 mg total) under the tongue every 5 (five) minutes x 3 doses as needed for chest pain. 25 tablet 2   ondansetron (ZOFRAN) 8 MG tablet Take 1 tablet (8 mg total) by mouth every 8 (eight) hours as needed for nausea or vomiting. 20 tablet 0   pantoprazole (PROTONIX) 40 MG tablet TAKE 1 TABLET BY MOUTH TWICE A DAY 180 tablet 3   potassium chloride (KLOR-CON) 10 MEQ tablet TAKE 1 TABLET BY MOUTH EVERY DAY 90 tablet 1   promethazine-dextromethorphan (PROMETHAZINE-DM) 6.25-15 MG/5ML syrup Take 5 mLs by mouth every 8 (eight) hours as needed for cough. 180 mL 0   sodium bicarbonate 650 MG tablet Take 650 mg by mouth 2 (two) times daily.      Syringe/Needle, Disp, (SYRINGE 3CC/25GX1") 25G X 1" 3 ML MISC Use to inject into the skin 2x a day 100 each 3   vitamin B-12 (CYANOCOBALAMIN) 100 MCG tablet Take 100 mcg by mouth daily.     azithromycin (ZITHROMAX Z-PAK) 250 MG tablet Take 2 tablets (500 mg) PO today, then 1 tablet (250 mg) PO daily x4 days. (Patient not taking: Reported on 05/16/2023) 6 tablet 0   predniSONE (DELTASONE) 10 MG tablet 3 tabs for 3 days, 2 tabs for 3 days, then 1 tab for 3 days, then stop (Patient not taking: Reported on 05/16/2023) 18 tablet 0   Current Facility-Administered Medications  Medication Dose Route Frequency Provider Last Rate Last Admin   ipratropium-albuterol (DUONEB) 0.5-2.5 (3) MG/3ML nebulizer solution 3 mL  3 mL Nebulization Once Ria Clock  Margarita Grizzle, FNP        Allergies: Aspirin and Ramipril  Past Medical History:  Diagnosis Date   Allergic rhinitis    Anterior chest wall pain    Anxiety    Asthma    Chronic diastolic CHF (congestive heart failure) (HCC)    Echo 01/2020: EF 60-65, normal wall motion, mild LVH, normal RV SF, RVSP 52.6 (moderate elevation), severe LAE, moderate RAE, trivial MR, mild MS (mean gradient 5.5 mmHg), mild aortic valve sclerosis (no AS); elevated E/e' c/w elevated LVEDP   Cough    DM type 2 (diabetes mellitus, type 2) (HCC)    GERD (gastroesophageal reflux disease)    History of diverticulitis of colon    HTN (hypertension)    Hyperlipidemia    IBS (irritable bowel syndrome)    Iron deficiency anemia    Mitral stenosis 04/17/2022   Echo 04/2022: EF 55-60, no RWMA, mild LVH, normal RVSF, normal PASP, moderate LAE, moderate mitral stenosis (mean gradient 4 mmHg), trivial MR, trivial AI, AV sclerosis without stenosis   Osteoporosis, unspecified    Renal insufficiency 07/31/2017   UTI (urinary tract infection)     Past Surgical History:  Procedure Laterality Date   CARDIOVASCULAR STRESS TEST  02/25/04   CORONARY BALLOON ANGIOPLASTY N/A 12/28/2021   Procedure:  CORONARY BALLOON ANGIOPLASTY;  Surgeon: Kathleene Hazel, MD;  Location: [facility];  Service: Cardiovascular;  Laterality: N/A;   CORONARY STENT INTERVENTION N/A 12/28/2021   Procedure: CORONARY STENT INTERVENTION;  Surgeon: Kathleene Hazel, MD;  Location: [facility];  Service: Cardiovascular;  Laterality: N/A;   ESOPHAGOGASTRODUODENOSCOPY  12/26/01   PACEMAKER IMPLANT N/A 08/02/2020   Procedure: PACEMAKER IMPLANT;  Surgeon: Duke Salvia, MD;  [facility]VAS[facility];  Service: Cardiovascular;  Laterality: N/A;   RIGHT OOPHORECTOMY  jan 2010   RIGHT/LEFT HEART CATH AND CORONARY ANGIOGRAPHY N/A 12/28/2021   Procedure: RIGHT/LEFT HEART CATH AND CORONARY ANGIOGRAPHY;  Surgeon: Kathleene Hazel, MD;  Location: [facility];  Service: Cardiovascular;  Laterality: N/A;    Family History  Problem Relation Age of Onset   Lung cancer Brother    Melanoma Brother    Hypertension Mother    Stroke Mother    Other Father        poor circulation   Cancer Sister    Diabetes Daughter    Atopy Neg Hx    Asthma Neg Hx    Breast cancer Neg Hx     Social History   Tobacco Use   Smoking status: Former    Current packs/day: 0.00    Average packs/day: 0.3 packs/day for 5.0 years (1.5 ttl pk-yrs)    Types: Cigarettes    Start date: 10/02/1981    Quit date: 10/02/1986    Years since quitting: 36.6   Smokeless tobacco: Never  Substance Use Topics   Alcohol use: No    Subjective:   Wanted to verify status of allergy referral done by her pulmonologist; Also concerned about sensation of difficulty swallowing/ "food getting stuck"  Objective:  Vitals:   05/25/23 1044  BP: 128/70  Pulse: 70  Resp: 18  SpO2: 98%  Weight: 175 lb (79.4 kg)  Height: 5\' 5"  (1.651 m)    General: Well developed, well nourished, in no acute distress  Skin : Warm and dry.  Head: Normocephalic and atraumatic  Lungs: Respirations unlabored; clear to auscultation bilaterally  without wheeze, rales, rhonchi  CVS exam: normal rate and  regular rhythm.  Neurologic: Alert and oriented; speech intact; face symmetrical; moves all extremities well; CNII-XII intact without focal deficit   Assessment:  1. Dysphagia, unspecified type     Plan:  Refer to GI for further evaluation;  Contact information given for allergist- daughter will get patient scheduled;  Discussed getting her flu shot at the end of September- will go to pharmacy;   No follow-ups on file.  Orders Placed This Encounter  Procedures   Ambulatory referral to Gastroenterology    Referral Priority:   Routine    Referral Type:   Consultation    Referral Reason:   Specialty Services Required    Number of Visits Requested:   1    Requested Prescriptions    No prescriptions requested or ordered in this encounter

## 2023-05-25 NOTE — Patient Instructions (Signed)
[facility] [address] [place], [place] [address] [phone]  [phone]       Dear    We have received a referral from Sandrea Hughs, MD  for you to be evaluated.  If you are interested in scheduling with [place] here at [facility], please call our office at [phone].  We look forward to taking care of your healthcare needs.   Thank you!   [facility]

## 2023-05-29 DIAGNOSIS — H1132 Conjunctival hemorrhage, left eye: Secondary | ICD-10-CM | POA: Diagnosis not present

## 2023-05-30 ENCOUNTER — Ambulatory Visit (INDEPENDENT_AMBULATORY_CARE_PROVIDER_SITE_OTHER): Payer: 59 | Admitting: Internal Medicine

## 2023-05-30 ENCOUNTER — Encounter: Payer: Self-pay | Admitting: Internal Medicine

## 2023-05-30 ENCOUNTER — Other Ambulatory Visit: Payer: Self-pay

## 2023-05-30 VITALS — BP 110/64 | HR 72 | Temp 97.2°F | Resp 16 | Ht 67.0 in | Wt 173.9 lb

## 2023-05-30 DIAGNOSIS — B9689 Other specified bacterial agents as the cause of diseases classified elsewhere: Secondary | ICD-10-CM | POA: Diagnosis not present

## 2023-05-30 DIAGNOSIS — J3089 Other allergic rhinitis: Secondary | ICD-10-CM

## 2023-05-30 DIAGNOSIS — J45991 Cough variant asthma: Secondary | ICD-10-CM | POA: Diagnosis not present

## 2023-05-30 DIAGNOSIS — R768 Other specified abnormal immunological findings in serum: Secondary | ICD-10-CM

## 2023-05-30 DIAGNOSIS — J019 Acute sinusitis, unspecified: Secondary | ICD-10-CM | POA: Diagnosis not present

## 2023-05-30 MED ORDER — CETIRIZINE HCL 10 MG PO TABS: 10.0000 mg | ORAL_TABLET | Freq: Every day | ORAL | 5 refills | Status: DC | PRN

## 2023-05-30 MED ORDER — FLUTICASONE PROPIONATE 50 MCG/ACT NA SUSP
2.0000 | Freq: Every day | NASAL | 5 refills | Status: DC
Start: 2023-05-30 — End: 2023-09-05

## 2023-05-30 NOTE — Patient Instructions (Addendum)
Other Allergic Rhinitis: - Use nasal saline rinses before nose sprays such as with Neilmed Sinus Rinse.  Use distilled water.   - Use Flonase 2 sprays each nostril daily. Aim upward and outward. - Use Zyrtec 10 mg daily.  Hold this and any anti histamines 3 days prior to next visit.   Elevated IgE - Will recheck in few months.   Cough Variant Asthma - Follow up with Dr. Sherene Sires.  On Symbicort and as needed Albuterol   Follow up: 9/3 at 9 AM; overbook with me for skin testing

## 2023-05-30 NOTE — Progress Notes (Signed)
NEW PATIENT  Date of Service/Encounter:  05/30/23  Consult requested by: Olive Bass, FNP   Subjective:   [name] (DOB: [birth year]) is a 87 y.o. female who presents to the clinic on 05/30/2023 with a chief complaint of Bloodwork (Elevated IgE) .    History obtained from: chart review and patient and Daughter .   Asthma:  Diagnosed as an adult.  Followed by Dr Sherene Sires for cough variant asthma.  Currently on Symbicort which is helping with her dyspnea.  Not sure how often they require albuterol.  Also with diastolic heart failure and CKD.  On lasix daily. Noted to have elevated IgE 05/16/2023. No significant travel history.   Rhinitis:  Started in childhood.  Symptoms include: nasal congestion, rhinorrhea, and post nasal drainage  Occurs year-round Potential triggers: not sure Treatments tried:  Flonase and Zyrtec PRN  Previous allergy testing: no History of reflux/heartburn: yes also with dysphagia.  On PPI and Pepcid.  Plan to see GI soon.  History of sinus surgery: no Nonallergic triggers: none     Past Medical History: Past Medical History:  Diagnosis Date   Allergic rhinitis    Anterior chest wall pain    Anxiety    Asthma    Chronic diastolic CHF (congestive heart failure) (HCC)    Echo 01/2020: EF 60-65, normal wall motion, mild LVH, normal RV SF, RVSP 52.6 (moderate elevation), severe LAE, moderate RAE, trivial MR, mild MS (mean gradient 5.5 mmHg), mild aortic valve sclerosis (no AS); elevated E/e' c/w elevated LVEDP   Cough    DM type 2 (diabetes mellitus, type 2) (HCC)    GERD (gastroesophageal reflux disease)    History of diverticulitis of colon    HTN (hypertension)    Hyperlipidemia    IBS (irritable bowel syndrome)    Iron deficiency anemia    Mitral stenosis 04/17/2022   Echo 04/2022: EF 55-60, no RWMA, mild LVH, normal RVSF, normal PASP, moderate LAE, moderate mitral stenosis (mean gradient 4 mmHg), trivial MR, trivial AI, AV sclerosis without  stenosis   Osteoporosis, unspecified    Renal insufficiency 07/31/2017   UTI (urinary tract infection)     Past Surgical History: Past Surgical History:  Procedure Laterality Date   CARDIOVASCULAR STRESS TEST  02/25/04   CORONARY BALLOON ANGIOPLASTY N/A 12/28/2021   Procedure: CORONARY BALLOON ANGIOPLASTY;  Surgeon: Kathleene Hazel, MD;  Location: [facility];  Service: Cardiovascular;  Laterality: N/A;   CORONARY STENT INTERVENTION N/A 12/28/2021   Procedure: CORONARY STENT INTERVENTION;  Surgeon: Kathleene Hazel, MD;  Location: [facility];  Service: Cardiovascular;  Laterality: N/A;   ESOPHAGOGASTRODUODENOSCOPY  12/26/01   PACEMAKER IMPLANT N/A 08/02/2020   Procedure: PACEMAKER IMPLANT;  Surgeon: Duke Salvia, MD;  Location: [facility] INVASIVE CV LAB;  Service: Cardiovascular;  Laterality: N/A;   RIGHT OOPHORECTOMY  jan 2010   RIGHT/LEFT HEART CATH AND CORONARY ANGIOGRAPHY N/A 12/28/2021   Procedure: RIGHT/LEFT HEART CATH AND CORONARY ANGIOGRAPHY;  Surgeon: Kathleene Hazel, MD;  Location: [facility];  Service: Cardiovascular;  Laterality: N/A;    Family History: Family History  Problem Relation Age of Onset   Lung cancer Brother    Melanoma Brother    Hypertension Mother    Stroke Mother    Other Father        poor circulation   Cancer Sister    Diabetes Daughter    Atopy Neg Hx    Asthma Neg Hx  Breast cancer Neg Hx     Social History:  Flooring in bedroom: carpet Pets: none Tobacco use/exposure: used to smoke long time ago for a few years  Job: n/a  Medication List:  Allergies as of 05/30/2023       Reactions   Aspirin Shortness Of Breath, Other (See Comments)   Caused asthma symptoms   Ramipril Cough        Medication List        Accurate as of May 30, 2023  2:54 PM. If you have any questions, ask your nurse or doctor.          STOP taking these medications    azithromycin 250 MG tablet Commonly known  as: Zithromax Z-Pak Stopped by: Birder Robson   predniSONE 10 MG tablet Commonly known as: DELTASONE Stopped by: Birder Robson       TAKE these medications    acetaminophen 325 MG tablet Commonly known as: TYLENOL Take 650 mg by mouth every 6 (six) hours as needed for pain.   albuterol (2.5 MG/3ML) 0.083% nebulizer solution Commonly known as: PROVENTIL Take 3 mLs (2.5 mg total) by nebulization every 6 (six) hours as needed for wheezing or shortness of breath.   albuterol 108 (90 Base) MCG/ACT inhaler Commonly known as: ProAir HFA 2 puffs every 4 hours as needed only  if your can't catch your breath   atorvastatin 80 MG tablet Commonly known as: LIPITOR Take 1 tablet (80 mg total) by mouth daily.   BD Veo Insulin Syringe U/F 31G X 15/64" 0.5 ML Misc Generic drug: Insulin Syringe-Needle U-100 USE AS INSTRUCTED   benzonatate 200 MG capsule Commonly known as: TESSALON Take 1 capsule (200 mg total) by mouth 3 (three) times daily as needed.   budesonide-formoterol 160-4.5 MCG/ACT inhaler Commonly known as: Symbicort TAKE 2 PUFFS FIRST THING IN AM AND THEN ANOTHER 2 PUFFS ABOUT 12 HOURS LATER.   carvedilol 3.125 MG tablet Commonly known as: COREG Take 1 tablet (3.125 mg total) by mouth 2 (two) times daily with a meal.   cetirizine 10 MG tablet Commonly known as: ZyrTEC Allergy Take 1 tablet (10 mg total) by mouth daily as needed for allergies.   cholecalciferol 25 MCG (1000 UNIT) tablet Commonly known as: VITAMIN D3 Take 1,000 Units by mouth daily. Isn't taking regularly   clopidogrel 75 MG tablet Commonly known as: PLAVIX TAKE 1 TABLET BY MOUTH DAILY WITH BREAKFAST.   diclofenac Sodium 1 % Gel Commonly known as: VOLTAREN Apply 4 g topically 4 (four) times daily as needed (pain).   Eliquis 2.5 MG Tabs tablet Generic drug: apixaban TAKE 1 TABLET BY MOUTH TWICE A DAY   famotidine 20 MG tablet Commonly known as: PEPCID Take 1 tablet (20 mg total) by mouth 2  (two) times daily.   Farxiga 5 MG Tabs tablet Generic drug: dapagliflozin propanediol TAKE 1 TABLET BY MOUTH EVERY DAY BEFORE BREAKFAST   fluticasone 50 MCG/ACT nasal spray Commonly known as: FLONASE Place 2 sprays into both nostrils daily.   furosemide 40 MG tablet Commonly known as: LASIX Take 1 tablet (40 mg total) by mouth 2 (two) times daily.   glucose blood test strip Check blood sugar twice a day   OneTouch Verio test strip Generic drug: glucose blood 1 each by Other route 2 (two) times daily. And lancets 2/day   hydrALAZINE 25 MG tablet Commonly known as: APRESOLINE TAKE 3 TABLETS (75 MG TOTAL) BY MOUTH 3 TIMES A DAY   insulin  lispro 100 UNIT/ML injection Commonly known as: HumaLOG Give 3 units with BREAKFAST, AND 18 units with SUPPER   latanoprost 0.005 % ophthalmic solution Commonly known as: XALATAN Place 1 drop into both eyes at bedtime.   montelukast 10 MG tablet Commonly known as: SINGULAIR TAKE 1 TABLET BY MOUTH EVERYDAY AT BEDTIME   nitroGLYCERIN 0.4 MG SL tablet Commonly known as: NITROSTAT Place 1 tablet (0.4 mg total) under the tongue every 5 (five) minutes x 3 doses as needed for chest pain.   ondansetron 8 MG tablet Commonly known as: Zofran Take 1 tablet (8 mg total) by mouth every 8 (eight) hours as needed for nausea or vomiting.   OneTouch Verio Flex System w/Device Kit USE AS DIRECTED   pantoprazole 40 MG tablet Commonly known as: PROTONIX TAKE 1 TABLET BY MOUTH TWICE A DAY   potassium chloride 10 MEQ tablet Commonly known as: KLOR-CON TAKE 1 TABLET BY MOUTH EVERY DAY   promethazine-dextromethorphan 6.25-15 MG/5ML syrup Commonly known as: PROMETHAZINE-DM Take 5 mLs by mouth every 8 (eight) hours as needed for cough.   sodium bicarbonate 650 MG tablet Take 650 mg by mouth 2 (two) times daily.   SYRINGE 3CC/25GX1" 25G X 1" 3 ML Misc Use to inject into the skin 2x a day   vitamin B-12 100 MCG tablet Commonly known as:  CYANOCOBALAMIN Take 100 mcg by mouth daily.   vitamin C 1000 MG tablet Take 1,000 mg by mouth daily.         REVIEW OF SYSTEMS: Pertinent positives and negatives discussed in HPI.   Objective:   Physical Exam: BP 110/64 (BP Location: Right Arm, Patient Position: Sitting, Cuff Size: Large)   Pulse 72   Temp (!) 97.2 F (36.2 C) (Temporal)   Resp 16   Ht 5\' 7"  (1.702 m)   Wt 173 lb 14.4 oz (78.9 kg)   SpO2 99%   BMI 27.24 kg/m  Body mass index is 27.24 kg/m. GEN: alert, well developed HEENT: clear conjunctiva, TM grey and translucent, nose with + inferior turbinate hypertrophy, pink nasal mucosa, slight clear rhinorrhea, + cobblestoning HEART: regular rate and rhythm, no murmur LUNGS: clear to auscultation bilaterally, no coughing, unlabored respiration ABDOMEN: soft, non distended  SKIN: no rashes or lesions  Reviewed:  05/16/2023: followed by Dr Ocie Doyne for DOE, cough variant asthma, CKD. Referred to Allergy for elevated IgE. On Symbicort 2 puffs BID.  04/27/2023: seen in ED for wheeze, cough, SOB. Noted to have wheezing on exam. Given duoneb with improvement. No signs of volume overload.   10/04/2022: seen by Cardiology Dr Tenny Craw for CAD s/p PTCA/DES, paroxysmal Afib, complete heart block s/p PPM, diastolic heart failure. On DAPT.  Also volume overloaded so on Lasix. Also with CKD.  04/27/2023: CT chest IMPRESSION: 1. Bronchial wall thickening and mucous plugging in the lower lobes greatest in the left lower lobe. 2. Small hiatal hernia with mild wall thickening of the lower esophagus suggesting esophagitis. 3. Cholelithiasis.   Echo 04/09/2023: EF 55-60%, Left and right atria severely dilated Moderate mitral stenosis.   05/16/2023: BNP of 214 05/16/2023: IgE of 919, AEC 100    Assessment:   1. Other allergic rhinitis   2. Elevated IgE level   3. Cough variant asthma     Plan/Recommendations:  Other Allergic Rhinitis: - Due to turbinate hypertrophy  and unresponsive to over the counter meds, performed skin testing to identify aeroallergen triggers.   - Use nasal saline rinses before nose sprays such  as with Neilmed Sinus Rinse.  Use distilled water.   - Use Flonase 2 sprays each nostril daily. Aim upward and outward. - Use Zyrtec 10 mg daily.  Hold this and any anti histamines 3 days prior to next visit.   Elevated IgE - Elevated IgE with normal eosinophil count is nonspecific.   - Will recheck in few months.  If it continues to rise, will consider SPEP/FLC. Low suspicion for parasitic dx.   Cough Variant Asthma - Follow up with Dr. Sherene Sires.  On Symbicort and PRN Albuterol   Follow up: 9/3 at 9 AM; overbook with me for skin testing 1-55  Return in about 6 days (around 06/05/2023).  Alesia Morin, MD [facility]

## 2023-06-05 ENCOUNTER — Other Ambulatory Visit: Payer: Self-pay

## 2023-06-05 ENCOUNTER — Encounter: Payer: Self-pay | Admitting: Internal Medicine

## 2023-06-05 ENCOUNTER — Ambulatory Visit (INDEPENDENT_AMBULATORY_CARE_PROVIDER_SITE_OTHER): Payer: 59 | Admitting: Internal Medicine

## 2023-06-05 VITALS — BP 160/84 | HR 80 | Temp 98.1°F | Resp 16 | Ht 65.0 in | Wt 174.9 lb

## 2023-06-05 DIAGNOSIS — J301 Allergic rhinitis due to pollen: Secondary | ICD-10-CM | POA: Diagnosis not present

## 2023-06-05 DIAGNOSIS — L821 Other seborrheic keratosis: Secondary | ICD-10-CM

## 2023-06-05 DIAGNOSIS — J3089 Other allergic rhinitis: Secondary | ICD-10-CM

## 2023-06-05 NOTE — Patient Instructions (Addendum)
Allergic Rhinitis: - Positive skin test 06/2023: cockroach, ragweed  - Avoidance measures discussed. - Use nasal saline rinses before nose sprays such as with Neilmed Sinus Rinse.  Use distilled water.   - Use Flonase 2 sprays each nostril daily. Aim upward and outward. - Use Zyrtec 10 mg daily.  - Consider allergy shots as long term control of your symptoms by teaching your immune system to be more tolerant of your allergy triggers   Elevated IgE - Elevated IgE with normal eosinophil count is nonspecific.   - Will recheck in few months.    Cough Variant Asthma - Follow up with Dr. Sherene Sires.  On Symbicort and PRN Albuterol.  Seborrheic Keratosis  - Will refer to Dermatology.   ALLERGEN AVOIDANCE MEASURES  Cockroach Limit spread of food around the house; especially keep food out of bedrooms. Keep food and garbage in closed containers with a tight lid.  Never leave food out in the kitchen.  Do not leave out pet food or dirty food bowls. Mop the kitchen floor and wash countertops at least once a week. Repair leaky pipes and faucets so there is no standing water to attract roaches. Plug up cracks in the house through which cockroaches can enter. Use bait stations and approved pesticides to reduce cockroach infestation. Pollen Avoidance Pollen levels are highest during the mid-day and afternoon.  Consider this when planning outdoor activities. Avoid being outside when the grass is being mowed, or wear a mask if the pollen-allergic person must be the one to mow the grass. Keep the windows closed to keep pollen outside of the home. Use an air conditioner to filter the air. Take a shower, wash hair, and change clothing after working or playing outdoors during pollen season.

## 2023-06-05 NOTE — Progress Notes (Signed)
FOLLOW UP Date of Service/Encounter:  06/05/23   Subjective:  [name] (DOB: [birth year]) is a 87 y.o. female who [facility] for follow up for skin testing.   History obtained from: chart review and patient. Anti histamines held.  Lots of moles on her back causing severe itching.  Has not seen Dermatology   Past Medical History: Past Medical History:  Diagnosis Date   Allergic rhinitis    Anterior chest wall pain    Anxiety    Asthma    Chronic diastolic CHF (congestive heart failure) (HCC)    Echo 01/2020: EF 60-65, normal wall motion, mild LVH, normal RV SF, RVSP 52.6 (moderate elevation), severe LAE, moderate RAE, trivial MR, mild MS (mean gradient 5.5 mmHg), mild aortic valve sclerosis (no AS); elevated E/e' c/w elevated LVEDP   Cough    DM type 2 (diabetes mellitus, type 2) (HCC)    GERD (gastroesophageal reflux disease)    History of diverticulitis of colon    HTN (hypertension)    Hyperlipidemia    IBS (irritable bowel syndrome)    Iron deficiency anemia    Mitral stenosis 04/17/2022   Echo 04/2022: EF 55-60, no RWMA, mild LVH, normal RVSF, normal PASP, moderate LAE, moderate mitral stenosis (mean gradient 4 mmHg), trivial MR, trivial AI, AV sclerosis without stenosis   Osteoporosis, unspecified    Renal insufficiency 07/31/2017   UTI (urinary tract infection)     Objective:  BP (!) 160/84 (BP Location: Right Arm, Patient Position: Sitting, Cuff Size: Normal) Comment: Patient stated she has not takern BP this morning,  Pulse 80   Temp 98.1 F (36.7 C) (Temporal)   Resp 16   Ht 5\' 5"  (1.651 m)   Wt 174 lb 14.4 oz (79.3 kg)   SpO2 100%   BMI 29.10 kg/m  Body mass index is 29.1 kg/m. Physical Exam: GEN: alert, well developed HEENT: clear conjunctiva, MMM HEART: regular rate  LUNGS:  no coughing, unlabored respiration SKIN: lots of waxy, raised plaques on back   Skin Testing:  Skin prick testing was placed, which  includes aeroallergens/foods, histamine control, and saline control.  Verbal consent was obtained prior to placing test.  Patient tolerated procedure well.  Allergy testing results were read and interpreted by myself, documented by clinical staff. Adequate positive and negative control.  Positive results to:  Results discussed with patient/family.  Airborne Adult Perc - 06/05/23 0857     Time Antigen Placed 0857    Allergen Manufacturer Waynette Buttery    Location Back    Number of Test 55    Panel 1 Select    1. Control-Buffer 50% Glycerol Negative    2. Control-Histamine 3+    3. Bahia Negative    4. French Southern Territories Negative    5. Johnson Negative    6. Kentucky Blue Negative    7. Meadow Fescue Negative    8. Perennial Rye Negative    9. Timothy Negative    10. Ragweed Mix 2+    11. Cocklebur Negative    12. Plantain,  English Negative    13. Baccharis Negative    14. Dog Fennel Negative    15. Russian Thistle Negative    16. Lamb's Quarters Negative    17. Sheep Sorrell Negative    18. Rough Pigweed Negative    19. Marsh Elder, Rough Negative    20. Mugwort, Common Negative    21. Box, Elder Negative  22. Cedar, red Negative    23. Sweet Gum Negative    24. Pecan Pollen Negative    25. Pine Mix Negative    26. Walnut, Black Pollen Negative    27. Red Mulberry Negative    28. Ash Mix Negative    29. Birch Mix Negative    30. Beech American Negative    31. Cottonwood, Guinea-Bissau Negative    32. Hickory, White Negative    33. Maple Mix Negative    34. Oak, Guinea-Bissau Mix Negative    35. Sycamore Eastern Negative    36. Alternaria Alternata Negative    37. Cladosporium Herbarum Negative    38. Aspergillus Mix Negative    39. Penicillium Mix Negative    40. Bipolaris Sorokiniana (Helminthosporium) Negative    41. Drechslera Spicifera (Curvularia) Negative    42. Mucor Plumbeus Negative    43. Fusarium Moniliforme Negative    44. Aureobasidium Pullulans (pullulara) Negative    45.  Rhizopus Oryzae Negative    46. Botrytis Cinera Negative    47. Epicoccum Nigrum Negative    48. Phoma Betae Negative    49. Dust Mite Mix Negative    50. Cat Hair 10,000 BAU/ml Negative    51.  Dog Epithelia Negative    52. Mixed Feathers Negative    53. Horse Epithelia Negative    54. Cockroach, German 2+    55. Tobacco Leaf Negative              Assessment:   1. Seborrheic keratosis   2. Seasonal allergic rhinitis due to pollen   3. Allergic rhinitis due to insect     Plan/Recommendations:  Allergic Rhinitis: - Due to turbinate hypertrophy and unresponsive to over the counter meds, performed skin testing to identify aeroallergen triggers.   - Positive skin test 06/2023: cockroach, ragweed  - Avoidance measures discussed. - Use nasal saline rinses before nose sprays such as with Neilmed Sinus Rinse.  Use distilled water.   - Use Flonase 2 sprays each nostril daily. Aim upward and outward. - Use Zyrtec 10 mg daily.  - Consider allergy shots as long term control of your symptoms by teaching your immune system to be more tolerant of your allergy triggers   Elevated IgE - Elevated IgE with normal eosinophil count is nonspecific.   - Will recheck in few months.  If it continues to rise, will consider SPEP/FLC. Low suspicion for parasitic dx.    Cough Variant Asthma - Follow up with Dr. Sherene Sires.  On Symbicort and PRN Albuterol.  Seborrheic Keratosis  - Lots of waxy, raised plaques, discussed likely seborrheic keratosis.  She does have severe itching with two of the large ones.  Will refer to Dermatology. Denies sudden eruption of them and also notes her daughter and mother have them too.    ALLERGEN AVOIDANCE MEASURES  Cockroach Limit spread of food around the house; especially keep food out of bedrooms. Keep food and garbage in closed containers with a tight lid.  Never leave food out in the kitchen.  Do not leave out pet food or dirty food bowls. Mop the kitchen floor and  wash countertops at least once a week. Repair leaky pipes and faucets so there is no standing water to attract roaches. Plug up cracks in the house through which cockroaches can enter. Use bait stations and approved pesticides to reduce cockroach infestation. Pollen Avoidance Pollen levels are highest during the mid-day and afternoon.  Consider this when planning outdoor  activities. Avoid being outside when the grass is being mowed, or wear a mask if the pollen-allergic person must be the one to mow the grass. Keep the windows closed to keep pollen outside of the home. Use an air conditioner to filter the air. Take a shower, wash hair, and change clothing after working or playing outdoors during pollen season.    Return in about 3 months (around 09/04/2023).  Alesia Morin, MD [facility]

## 2023-06-07 ENCOUNTER — Encounter: Payer: Self-pay | Admitting: Internal Medicine

## 2023-06-07 ENCOUNTER — Ambulatory Visit (INDEPENDENT_AMBULATORY_CARE_PROVIDER_SITE_OTHER): Payer: 59 | Admitting: Internal Medicine

## 2023-06-07 VITALS — BP 118/62 | HR 71 | Ht 60.0 in | Wt 174.0 lb

## 2023-06-07 DIAGNOSIS — B370 Candidal stomatitis: Secondary | ICD-10-CM

## 2023-06-07 DIAGNOSIS — R1310 Dysphagia, unspecified: Secondary | ICD-10-CM | POA: Diagnosis not present

## 2023-06-07 MED ORDER — FLUCONAZOLE 100 MG PO TABS: ORAL_TABLET | ORAL | 0 refills | Status: DC

## 2023-06-07 NOTE — Patient Instructions (Addendum)
You have been scheduled for a Barium Esophogram at [facility]iology (1st floor of the hospital) on 06/22/23 at [facility]  at 10:00 am . Please arrive 30 minutes prior to your appointment for registration. Make certain not to have anything to eat or drink 3 hours prior to your test. If you need to reschedule for any reason, please contact radiology at [phone] to do so. __________________________________________________________________ A barium swallow is an examination that concentrates on views of the esophagus. This tends to be a double contrast exam (barium and two liquids which, when combined, create a gas to distend the wall of the oesophagus) or single contrast (non-ionic iodine based). The study is usually tailored to your symptoms so a good history is essential. Attention is paid during the study to the form, structure and configuration of the esophagus, looking for functional disorders (such as aspiration, dysphagia, achalasia, motility and reflux) EXAMINATION You may be asked to change into a gown, depending on the type of swallow being performed. A radiologist and radiographer will perform the procedure. The radiologist will advise you of the type of contrast selected for your procedure and direct you during the exam. You will be asked to stand, sit or lie in several different positions and to hold a small amount of fluid in your mouth before being asked to swallow while the imaging is performed .In some instances you may be asked to swallow barium coated marshmallows to assess the motility of a solid food bolus. The exam can be recorded as a digital or video fluoroscopy procedure. POST PROCEDURE It will take 1-2 days for the barium to pass through your system. To facilitate this, it is important, unless otherwise directed, to increase your fluids for the next 24-48hrs and to resume your normal diet.  This test typically takes about 30 minutes to perform.  We have sent the  following medications to your pharmacy for you to pick up at your convenience: Fluconazole   Follow-up in 2 month with Dr Leonides Schanz - 08/08/23 at 3:40 pm  Due to recent changes in healthcare laws, you may see the results of your imaging and laboratory studies on MyChart before your provider has had a chance to review them.  We understand that in some cases there may be results that are confusing or concerning to you. Not all laboratory results come back in the same time frame and the provider may be waiting for multiple results in order to interpret others.  Please give [place] 48 hours in order for your provider to thoroughly review all the results before contacting the office for clarification of your results.   Thank you for choosing me and [facility].  Dr.Ying Leonides Schanz

## 2023-06-07 NOTE — Progress Notes (Signed)
Chief Complaint: Dysphagia  HPI : 87 year old female with history of HFpEF, A-fib on Eliquis, CAD on Plavix, DM, GERD, asthma, IBS, prior diverticulitis presents with dysphagia  Patient presents with dysphagia for years. She states that dysphagia has been present since her EGD procedure in 2009.  She does not recall if the fluconazole from her last EGD procedure helped towards others dysphagia.  Her dysphagia has been stable in severity.  Endorses dysphagia to both solids and liquids.  Dysphagia tends to occur in the bottom of her throat, and sometimes she has to regurgitate food back up after eating it.  She is not following any dietary restrictions at this time.  Denies any odynophagia.  Denies coughing with initiation of swallow.  Denies abdominal pain, melena, hematochezia, or weight loss.   Past Medical History:  Diagnosis Date   Allergic rhinitis    Anterior chest wall pain    Anxiety    Asthma    Chronic diastolic CHF (congestive heart failure) (HCC)    Echo 01/2020: EF 60-65, normal wall motion, mild LVH, normal RV SF, RVSP 52.6 (moderate elevation), severe LAE, moderate RAE, trivial MR, mild MS (mean gradient 5.5 mmHg), mild aortic valve sclerosis (no AS); elevated E/e' c/w elevated LVEDP   Cough    DM type 2 (diabetes mellitus, type 2) (HCC)    GERD (gastroesophageal reflux disease)    History of diverticulitis of colon    HTN (hypertension)    Hyperlipidemia    IBS (irritable bowel syndrome)    Iron deficiency anemia    Mitral stenosis 04/17/2022   Echo 04/2022: EF 55-60, no RWMA, mild LVH, normal RVSF, normal PASP, moderate LAE, moderate mitral stenosis (mean gradient 4 mmHg), trivial MR, trivial AI, AV sclerosis without stenosis   Osteoporosis, unspecified    Renal insufficiency 07/31/2017   UTI (urinary tract infection)      Past Surgical History:  Procedure Laterality Date   CARDIOVASCULAR STRESS TEST  02/25/04   CORONARY BALLOON ANGIOPLASTY N/A 12/28/2021    Procedure: CORONARY BALLOON ANGIOPLASTY;  Surgeon: Kathleene Hazel, MD;  Location: [facility] INVASIVE CV LAB;  Service: Cardiovascular;  Laterality: N/A;   CORONARY STENT INTERVENTION N/A 12/28/2021   Procedure: CORONARY STENT INTERVENTION;  Surgeon: Kathleene Hazel, MD;  Location: [facility]AS[facility] CV [facility];  Service: Cardiovascular;  Laterality: N/A;   ESOPHAGOGASTRODUODENOSCOPY  12/26/01   PACEMAKER IMPLANT N/A 08/02/2020   Procedure: PACEMAKER IMPLANT;  Surgeon: Duke Salvia, MD;  Location: [facility] INVASIVE CV LAB;  Service: Cardiovascular;  Laterality: N/A;   RIGHT OOPHORECTOMY  jan 2010   RIGHT/LEFT HEART CATH AND CORONARY ANGIOGRAPHY N/A 12/28/2021   Procedure: RIGHT/LEFT HEART CATH AND CORONARY ANGIOGRAPHY;  Surgeon: Kathleene Hazel, MD;  Location: [facility]VASIVE CV [facility];  Service: Cardiovascular;  Laterality: N/A;   Family History  Problem Relation Age of Onset   Hypertension Mother    Stroke Mother    Other Father        poor circulation   Cancer Sister    Lung cancer Brother    Melanoma Brother    Diabetes Daughter    Colon cancer Neg Hx    Liver disease Neg Hx    Esophageal cancer Neg Hx    Social History   Tobacco Use   Smoking status: Former    Current packs/day: 0.00    Average packs/day: 0.3 packs/day for 5.0 years (1.5 ttl pk-yrs)    Types: Cigarettes    Start date: 10/02/1981  Quit date: 10/02/1986    Years since quitting: 36.7   Smokeless tobacco: Never  Vaping Use   Vaping status: Never Used  Substance Use Topics   Alcohol use: No   Drug use: No   Current Outpatient Medications  Medication Sig Dispense Refill   acetaminophen (TYLENOL) 325 MG tablet Take 650 mg by mouth every 6 (six) hours as needed for pain.     albuterol (PROAIR HFA) 108 (90 Base) MCG/ACT inhaler 2 puffs every 4 hours as needed only  if your can't catch your breath 18 g 11   albuterol (PROVENTIL) (2.5 MG/3ML) 0.083% nebulizer solution Take 3 mLs (2.5 mg total) by nebulization every 6  (six) hours as needed for wheezing or shortness of breath. 150 mL 1   Ascorbic Acid (VITAMIN C) 1000 MG tablet Take 1,000 mg by mouth daily.     atorvastatin (LIPITOR) 80 MG tablet Take 1 tablet (80 mg total) by mouth daily. 90 tablet 3   BD VEO INSULIN SYRINGE U/F 31G X 15/64" 0.5 ML MISC USE AS INSTRUCTED 100 each 3   benzonatate (TESSALON) 200 MG capsule Take 1 capsule (200 mg total) by mouth 3 (three) times daily as needed. 30 capsule 1   Blood Glucose Monitoring Suppl (ONETOUCH VERIO FLEX SYSTEM) w/Device KIT USE AS DIRECTED 1 kit .   budesonide-formoterol (SYMBICORT) 160-4.5 MCG/ACT inhaler TAKE 2 PUFFS FIRST THING IN AM AND THEN ANOTHER 2 PUFFS ABOUT 12 HOURS LATER. 30.6 each 6   carvedilol (COREG) 3.125 MG tablet Take 1 tablet (3.125 mg total) by mouth 2 (two) times daily with a meal. 180 tablet 1   cetirizine (ZYRTEC ALLERGY) 10 MG tablet Take 1 tablet (10 mg total) by mouth daily as needed for allergies. 30 tablet 5   cholecalciferol (VITAMIN D3) 25 MCG (1000 UNIT) tablet Take 1,000 Units by mouth daily. Isn't taking regularly     clopidogrel (PLAVIX) 75 MG tablet TAKE 1 TABLET BY MOUTH DAILY WITH BREAKFAST. 90 tablet 2   diclofenac Sodium (VOLTAREN) 1 % GEL Apply 4 g topically 4 (four) times daily as needed (pain).     ELIQUIS 2.5 MG TABS tablet TAKE 1 TABLET BY MOUTH TWICE A DAY 60 tablet 5   famotidine (PEPCID) 20 MG tablet Take 1 tablet (20 mg total) by mouth 2 (two) times daily. 180 tablet 1   FARXIGA 5 MG TABS tablet TAKE 1 TABLET BY MOUTH EVERY DAY BEFORE BREAKFAST 30 tablet 3   fluticasone (FLONASE) 50 MCG/ACT nasal spray Place 2 sprays into both nostrils daily. 16 g 5   furosemide (LASIX) 40 MG tablet Take 1 tablet (40 mg total) by mouth 2 (two) times daily. 180 tablet 3   glucose blood (ONETOUCH VERIO) test strip 1 each by Other route 2 (two) times daily. And lancets 2/day 200 each 3   glucose blood test strip Check blood sugar twice a day 100 each 12   hydrALAZINE (APRESOLINE)  25 MG tablet TAKE 3 TABLETS (75 MG TOTAL) BY MOUTH 3 TIMES A DAY 270 tablet 0   insulin lispro (HUMALOG) 100 UNIT/ML injection Give 3 units with BREAKFAST, AND 18 units with SUPPER 60 mL 1   latanoprost (XALATAN) 0.005 % ophthalmic solution Place 1 drop into both eyes at bedtime.     montelukast (SINGULAIR) 10 MG tablet TAKE 1 TABLET BY MOUTH EVERYDAY AT BEDTIME 90 tablet 1   nitroGLYCERIN (NITROSTAT) 0.4 MG SL tablet Place 1 tablet (0.4 mg total) under the tongue every 5 (five)  minutes x 3 doses as needed for chest pain. 25 tablet 2   ondansetron (ZOFRAN) 8 MG tablet Take 1 tablet (8 mg total) by mouth every 8 (eight) hours as needed for nausea or vomiting. 20 tablet 0   pantoprazole (PROTONIX) 40 MG tablet TAKE 1 TABLET BY MOUTH TWICE A DAY 180 tablet 3   potassium chloride (KLOR-CON) 10 MEQ tablet TAKE 1 TABLET BY MOUTH EVERY DAY 90 tablet 1   promethazine-dextromethorphan (PROMETHAZINE-DM) 6.25-15 MG/5ML syrup Take 5 mLs by mouth every 8 (eight) hours as needed for cough. 180 mL 0   sodium bicarbonate 650 MG tablet Take 650 mg by mouth 2 (two) times daily.     Syringe/Needle, Disp, (SYRINGE 3CC/25GX1") 25G X 1" 3 ML MISC Use to inject into the skin 2x a day 100 each 3   vitamin B-12 (CYANOCOBALAMIN) 100 MCG tablet Take 100 mcg by mouth daily.     Current Facility-Administered Medications  Medication Dose Route Frequency Provider Last Rate Last Admin   ipratropium-albuterol (DUONEB) 0.5-2.5 (3) MG/3ML nebulizer solution 3 mL  3 mL Nebulization Once Olive Bass, FNP       Allergies  Allergen Reactions   Aspirin Shortness Of Breath and Other (See Comments)    Caused asthma symptoms   Ramipril Cough     Review of Systems: All systems reviewed and negative except where noted in HPI.   Physical Exam: BP 118/62   Pulse 71   Ht 5' (1.524 m)   Wt 174 lb (78.9 kg)   BMI 33.98 kg/m  Constitutional: Pleasant,well-developed, female in no acute distress. HEENT: Normocephalic and  atraumatic. Oral thrush present Cardiovascular: Normal rate, regular rhythm.  Pulmonary/chest: Effort normal and breath sounds normal. No wheezing, rales or rhonchi. Abdominal: Soft, nondistended, nontender. Bowel sounds active throughout. There are no masses palpable. No hepatomegaly. Extremities: No edema Neurological: Alert and oriented to person place and time. Skin: Skin is warm and dry. No rashes noted. Psychiatric: Normal mood and affect. Behavior is normal.  Labs 04/2023: CMP with nml LFTs, low Na of 133, elevated Cr of 2.74, and glucose of 471.   Labs 05/2023: CBC with low plts of 110. BMP with elevated Cr of 2.82 and mildly low Na of 134. BNP mildly elevated at 214. TSH nml. ESR nml.   Gastric emptying study 02/20/14: IMPRESSION:  Normal exam.   HIDA scan 12/25/21: IMPRESSION: Normal hepatobiliary scan, demonstrating patency of both the cystic and common bile ducts.  CT A/P w/o contrast 07/19/22: IMPRESSION: 1. No acute abdominopelvic findings. 2. Colonic diverticulosis without acute diverticulitis. 3. Cholelithiasis without evidence of acute cholecystitis. 4. Punctate nonobstructing left lower pole renal calculus. 5. Small hiatal hernia. 6. Multichamber cardiomegaly with coronary artery calcifications and aortic valvular and mitral annular calcifications. 7. Similar bibasilar linear atelectasis/scarring with subsegmental mucous plugging and mild bronchiectasis left lower lobe. 8. Aortic Atherosclerosis (ICD10-I70.0).  Chest CT w/o contrast 04/27/23: IMPRESSION: 1. Bronchial wall thickening and mucous plugging in the lower lobes greatest in the left lower lobe. 2. Small hiatal hernia with mild wall thickening of the lower esophagus suggesting esophagitis. 3. Cholelithiasis.  EGD 04/02/08:   Colonoscopy 12/18/19: Excellent prep.    ASSESSMENT AND PLAN: Dysphagia Oral thrush Presents with dysphagia for years.  Her last EGD in 2009 showed Candida esophagitis that was  treated with fluconazole and gastritis.  On physical exam she is noted to have oral thrush, suggesting that she would be at risk for Candida esophagitis.  Will give her a  course of treatment for presumed Candida esophagitis and plan for barium swallow for further evaluation.  Patient is hesitant to undergo EGD at this time because she would like to avoid sedation if at all possible due to her age and comorbidities.   - Barium swallow - Fluconazole 200 mg every day, followed by 100 mg every day for 13 days - Declined EGD at this time - RTC in 2 months  Sheryl Pont, MD  I spent 45 minutes of time, including in depth chart review, independent review of results as outlined above, communicating results with the patient directly, face-to-face time with the patient, coordinating care, ordering studies and medications as appropriate, and documentation.

## 2023-06-13 ENCOUNTER — Ambulatory Visit (INDEPENDENT_AMBULATORY_CARE_PROVIDER_SITE_OTHER): Payer: 59 | Admitting: *Deleted

## 2023-06-13 DIAGNOSIS — Z780 Asymptomatic menopausal state: Secondary | ICD-10-CM | POA: Diagnosis not present

## 2023-06-13 DIAGNOSIS — Z0000 Encounter for general adult medical examination without abnormal findings: Secondary | ICD-10-CM

## 2023-06-13 NOTE — Patient Instructions (Signed)
[name] , Thank you for taking time to come for your Medicare Wellness Visit. I appreciate your ongoing commitment to your health goals. Please review the following plan we discussed and let me know if I can assist you in the future.   These are the goals we discussed:  Goals   None     This is a list of the screening recommended for you and due dates:  Health Maintenance  Topic Date Due   Zoster (Shingles) Vaccine (1 of 2) Never done   DTaP/Tdap/Td vaccine (2 - Tdap) 08/02/2008   COVID-19 Vaccine (3 - Pfizer risk series) 07/02/2020   Complete foot exam   03/11/2023   Flu Shot  12/31/2023*   Hemoglobin A1C  11/09/2023   Eye exam for diabetics  04/02/2024   Yearly kidney function blood test for diabetes  05/15/2024   Yearly kidney health urinalysis for diabetes  05/15/2024   Medicare Annual Wellness Visit  06/12/2024   Pneumonia Vaccine  Completed   DEXA scan (bone density measurement)  Completed   HPV Vaccine  Aged Out  *Topic was postponed. The date shown is not the original due date.     Next appointment: Follow up in one year for your annual wellness visit.   Preventive Care 85 Years and Older, Female Preventive care refers to lifestyle choices and visits with your health care provider that can promote health and wellness. What does preventive care include? A yearly physical exam. This is also called an annual well check. Dental exams once or twice a year. Routine eye exams. Ask your health care provider how often you should have your eyes checked. Personal lifestyle choices, including: Daily care of your teeth and gums. Regular physical activity. Eating a healthy diet. Avoiding tobacco and drug use. Limiting alcohol use. Practicing safe sex. Taking low-dose aspirin every day. Taking vitamin and mineral supplements as recommended by your health care provider. What happens during an annual well check? The services and screenings done by your health care provider  during your annual well check will depend on your age, overall health, lifestyle risk factors, and family history of disease. Counseling  Your health care provider may ask you questions about your: Alcohol use. Tobacco use. Drug use. Emotional well-being. Home and relationship well-being. Sexual activity. Eating habits. History of falls. Memory and ability to understand (cognition). Work and work Astronomer. Reproductive health. Screening  You may have the following tests or measurements: Height, weight, and BMI. Blood pressure. Lipid and cholesterol levels. These may be checked every 5 years, or more frequently if you are over 64 years old. Skin check. Lung cancer screening. You may have this screening every year starting at age 85 if you have a 30-pack-year history of smoking and currently smoke or have quit within the past 15 years. Fecal occult blood test (FOBT) of the stool. You may have this test every year starting at age 85. Flexible sigmoidoscopy or colonoscopy. You may have a sigmoidoscopy every 5 years or a colonoscopy every 10 years starting at age 85. Hepatitis C blood test. Hepatitis B blood test. Sexually transmitted disease (STD) testing. Diabetes screening. This is done by checking your blood sugar (glucose) after you have not eaten for a while (fasting). You may have this done every 1-3 years. Bone density scan. This is done to screen for osteoporosis. You may have this done starting at age 85. Mammogram. This may be done every 1-2 years. Talk to your health care provider about how  often you should have regular mammograms. Talk with your health care provider about your test results, treatment options, and if necessary, the need for more tests. Vaccines  Your health care provider may recommend certain vaccines, such as: Influenza vaccine. This is recommended every year. Tetanus, diphtheria, and acellular pertussis (Tdap, Td) vaccine. You may need a Td booster every  10 years. Zoster vaccine. You may need this after age 85. Pneumococcal 13-valent conjugate (PCV13) vaccine. One dose is recommended after age 85. Pneumococcal polysaccharide (PPSV23) vaccine. One dose is recommended after age 85. Talk to your health care provider about which screenings and vaccines you need and how often you need them. This information is not intended to replace advice given to you by your health care provider. Make sure you discuss any questions you have with your health care provider. Document Released: 10/15/2015 Document Revised: 06/07/2016 Document Reviewed: 07/20/2015 Elsevier Interactive Patient Education  2017 [employer].  Fall Prevention in the Home Falls can cause injuries. They can happen to people of all ages. There are many things you can do to make your home safe and to help prevent falls. What can I do on the outside of my home? Regularly fix the edges of walkways and driveways and fix any cracks. Remove anything that might make you trip as you walk through a door, such as a raised step or threshold. Trim any bushes or trees on the path to your home. Use bright outdoor lighting. Clear any walking paths of anything that might make someone trip, such as rocks or tools. Regularly check to see if handrails are loose or broken. Make sure that both sides of any steps have handrails. Any raised decks and porches should have guardrails on the edges. Have any leaves, snow, or ice cleared regularly. Use sand or salt on walking paths during winter. Clean up any spills in your garage right away. This includes oil or grease spills. What can I do in the bathroom? Use night lights. Install grab bars by the toilet and in the tub and shower. Do not use towel bars as grab bars. Use non-skid mats or decals in the tub or shower. If you need to sit down in the shower, use a plastic, non-slip stool. Keep the floor dry. Clean up any water that spills on the floor as soon as it  happens. Remove soap buildup in the tub or shower regularly. Attach bath mats securely with double-sided non-slip rug tape. Do not have throw rugs and other things on the floor that can make you trip. What can I do in the bedroom? Use night lights. Make sure that you have a light by your bed that is easy to reach. Do not use any sheets or blankets that are too big for your bed. They should not hang down onto the floor. Have a firm chair that has side arms. You can use this for support while you get dressed. Do not have throw rugs and other things on the floor that can make you trip. What can I do in the kitchen? Clean up any spills right away. Avoid walking on wet floors. Keep items that you use a lot in easy-to-reach places. If you need to reach something above you, use a strong step stool that has a grab bar. Keep electrical cords out of the way. Do not use floor polish or wax that makes floors slippery. If you must use wax, use non-skid floor wax. Do not have throw rugs and other things  on the floor that can make you trip. What can I do with my stairs? Do not leave any items on the stairs. Make sure that there are handrails on both sides of the stairs and use them. Fix handrails that are broken or loose. Make sure that handrails are as long as the stairways. Check any carpeting to make sure that it is firmly attached to the stairs. Fix any carpet that is loose or worn. Avoid having throw rugs at the top or bottom of the stairs. If you do have throw rugs, attach them to the floor with carpet tape. Make sure that you have a light switch at the top of the stairs and the bottom of the stairs. If you do not have them, ask someone to add them for you. What else can I do to help prevent falls? Wear shoes that: Do not have high heels. Have rubber bottoms. Are comfortable and fit you well. Are closed at the toe. Do not wear sandals. If you use a stepladder: Make sure that it is fully opened.  Do not climb a closed stepladder. Make sure that both sides of the stepladder are locked into place. Ask someone to hold it for you, if possible. Clearly mark and make sure that you can see: Any grab bars or handrails. First and last steps. Where the edge of each step is. Use tools that help you move around (mobility aids) if they are needed. These include: Canes. Walkers. Scooters. Crutches. Turn on the lights when you go into a dark area. Replace any light bulbs as soon as they burn out. Set up your furniture so you have a clear path. Avoid moving your furniture around. If any of your floors are uneven, fix them. If there are any pets around you, be aware of where they are. Review your medicines with your doctor. Some medicines can make you feel dizzy. This can increase your chance of falling. Ask your doctor what other things that you can do to help prevent falls. This information is not intended to replace advice given to you by your health care provider. Make sure you discuss any questions you have with your health care provider. Document Released: 07/15/2009 Document Revised: 02/24/2016 Document Reviewed: 10/23/2014 Elsevier Interactive Patient Education  2017 [employer].

## 2023-06-13 NOTE — Progress Notes (Signed)
Subjective:   [name] is a 87 y.o. female who presents for Medicare Annual (Subsequent) preventive examination.  Visit Complete: Virtual  I connected with  [name] on 06/13/23 by a audio enabled telemedicine application and verified that I am speaking with the correct person using two identifiers.  Patient Location: Home  Provider Location: Office/Clinic  I discussed the limitations of evaluation and management by telemedicine. The patient expressed understanding and agreed to proceed.   Review of Systems     Cardiac Risk Factors include: advanced age (>45men, >2 women);obesity (BMI >30kg/m2);diabetes mellitus;dyslipidemia;hypertension     Objective:    Vital Signs: Unable to obtain new vitals due to this being a telehealth visit.      06/13/2023    3:20 PM 04/27/2023    6:21 PM 09/22/2022    9:14 AM 04/06/2022    9:05 AM 01/23/2022    6:04 PM 12/23/2021    4:10 AM 12/20/2019    3:00 AM  Advanced Directives  Does Patient Have a Medical Advance Directive? No No No No No No No  Would patient like information on creating a medical advance directive? No - Patient declined  No - Patient declined No - Patient declined No - Patient declined Yes (ED - Information included in AVS) No - Patient declined    Current Medications (verified) Outpatient Encounter Medications as of 06/13/2023  Medication Sig   acetaminophen (TYLENOL) 325 MG tablet Take 650 mg by mouth every 6 (six) hours as needed for pain.   albuterol (PROAIR HFA) 108 (90 Base) MCG/ACT inhaler 2 puffs every 4 hours as needed only  if your can't catch your breath   albuterol (PROVENTIL) (2.5 MG/3ML) 0.083% nebulizer solution Take 3 mLs (2.5 mg total) by nebulization every 6 (six) hours as needed for wheezing or shortness of breath.   Ascorbic Acid (VITAMIN C) 1000 MG tablet Take 1,000 mg by mouth daily.   atorvastatin (LIPITOR) 80 MG tablet Take 1 tablet (80 mg total) by mouth daily.   BD VEO INSULIN SYRINGE U/F 31G X  15/64" 0.5 ML MISC USE AS INSTRUCTED   benzonatate (TESSALON) 200 MG capsule Take 1 capsule (200 mg total) by mouth 3 (three) times daily as needed.   Blood Glucose Monitoring Suppl (ONETOUCH VERIO FLEX SYSTEM) w/Device KIT USE AS DIRECTED   budesonide-formoterol (SYMBICORT) 160-4.5 MCG/ACT inhaler TAKE 2 PUFFS FIRST THING IN AM AND THEN ANOTHER 2 PUFFS ABOUT 12 HOURS LATER.   carvedilol (COREG) 3.125 MG tablet Take 1 tablet (3.125 mg total) by mouth 2 (two) times daily with a meal.   cetirizine (ZYRTEC ALLERGY) 10 MG tablet Take 1 tablet (10 mg total) by mouth daily as needed for allergies.   cholecalciferol (VITAMIN D3) 25 MCG (1000 UNIT) tablet Take 1,000 Units by mouth daily. Isn't taking regularly   clopidogrel (PLAVIX) 75 MG tablet TAKE 1 TABLET BY MOUTH DAILY WITH BREAKFAST.   diclofenac Sodium (VOLTAREN) 1 % GEL Apply 4 g topically 4 (four) times daily as needed (pain).   ELIQUIS 2.5 MG TABS tablet TAKE 1 TABLET BY MOUTH TWICE A DAY   famotidine (PEPCID) 20 MG tablet Take 1 tablet (20 mg total) by mouth 2 (two) times daily.   FARXIGA 5 MG TABS tablet TAKE 1 TABLET BY MOUTH EVERY DAY BEFORE BREAKFAST   fluconazole (DIFLUCAN) 100 MG tablet Take 2 tablets by mouth today, then take 1 tablet by mouth once daily for 13 days.   fluticasone (FLONASE) 50 MCG/ACT nasal  spray Place 2 sprays into both nostrils daily.   furosemide (LASIX) 40 MG tablet Take 1 tablet (40 mg total) by mouth 2 (two) times daily.   glucose blood (ONETOUCH VERIO) test strip 1 each by Other route 2 (two) times daily. And lancets 2/day   glucose blood test strip Check blood sugar twice a day   hydrALAZINE (APRESOLINE) 25 MG tablet TAKE 3 TABLETS (75 MG TOTAL) BY MOUTH 3 TIMES A DAY   insulin lispro (HUMALOG) 100 UNIT/ML injection Give 3 units with BREAKFAST, AND 18 units with SUPPER   latanoprost (XALATAN) 0.005 % ophthalmic solution Place 1 drop into both eyes at bedtime.   montelukast (SINGULAIR) 10 MG tablet TAKE 1 TABLET  BY MOUTH EVERYDAY AT BEDTIME   nitroGLYCERIN (NITROSTAT) 0.4 MG SL tablet Place 1 tablet (0.4 mg total) under the tongue every 5 (five) minutes x 3 doses as needed for chest pain.   ondansetron (ZOFRAN) 8 MG tablet Take 1 tablet (8 mg total) by mouth every 8 (eight) hours as needed for nausea or vomiting.   pantoprazole (PROTONIX) 40 MG tablet TAKE 1 TABLET BY MOUTH TWICE A DAY   potassium chloride (KLOR-CON) 10 MEQ tablet TAKE 1 TABLET BY MOUTH EVERY DAY   promethazine-dextromethorphan (PROMETHAZINE-DM) 6.25-15 MG/5ML syrup Take 5 mLs by mouth every 8 (eight) hours as needed for cough.   sodium bicarbonate 650 MG tablet Take 650 mg by mouth 2 (two) times daily.   Syringe/Needle, Disp, (SYRINGE 3CC/25GX1") 25G X 1" 3 ML MISC Use to inject into the skin 2x a day   vitamin B-12 (CYANOCOBALAMIN) 100 MCG tablet Take 100 mcg by mouth daily.   Facility-Administered Encounter Medications as of 06/13/2023  Medication   ipratropium-albuterol (DUONEB) 0.5-2.5 (3) MG/3ML nebulizer solution 3 mL    Allergies (verified) Aspirin and Ramipril   History: Past Medical History:  Diagnosis Date   Allergic rhinitis    Anterior chest wall pain    Anxiety    Asthma    Chronic diastolic CHF (congestive heart failure) (HCC)    Echo 01/2020: EF 60-65, normal wall motion, mild LVH, normal RV SF, RVSP 52.6 (moderate elevation), severe LAE, moderate RAE, trivial MR, mild MS (mean gradient 5.5 mmHg), mild aortic valve sclerosis (no AS); elevated E/e' c/w elevated LVEDP   Cough    DM type 2 (diabetes mellitus, type 2) (HCC)    GERD (gastroesophageal reflux disease)    History of diverticulitis of colon    HTN (hypertension)    Hyperlipidemia    IBS (irritable bowel syndrome)    Iron deficiency anemia    Mitral stenosis 04/17/2022   Echo 04/2022: EF 55-60, no RWMA, mild LVH, normal RVSF, normal PASP, moderate LAE, moderate mitral stenosis (mean gradient 4 mmHg), trivial MR, trivial AI, AV sclerosis without stenosis    Osteoporosis, unspecified    Renal insufficiency 07/31/2017   UTI (urinary tract infection)    Past Surgical History:  Procedure Laterality Date   CARDIOVASCULAR STRESS TEST  02/25/04   CORONARY BALLOON ANGIOPLASTY N/A 12/28/2021   Procedure: CORONARY BALLOON ANGIOPLASTY;  Surgeon: Kathleene Hazel, MD;  Location: [facility];  Service: Cardiovascular;  Laterality: N/A;   CORONARY STENT INTERVENTION N/A 12/28/2021   Procedure: CORONARY STENT INTERVENTION;  Surgeon: Kathleene Hazel, MD;  Location: [facility];  Service: Cardiovascular;  Laterality: N/A;   ESOPHAGOGASTRODUODENOSCOPY  12/26/01   PACEMAKER IMPLANT N/A 08/02/2020   Procedure: PACEMAKER IMPLANT;  Surgeon: Duke Salvia, MD;  Location:  [facility];  Service: Cardiovascular;  Laterality: N/A;   RIGHT OOPHORECTOMY  jan 2010   RIGHT/LEFT HEART CATH AND CORONARY ANGIOGRAPHY N/A 12/28/2021   Procedure: RIGHT/LEFT HEART CATH AND CORONARY ANGIOGRAPHY;  Surgeon: Kathleene Hazel, MD;  Location: [facility];  Service: Cardiovascular;  Laterality: N/A;   Family History  Problem Relation Age of Onset   Hypertension Mother    Stroke Mother    Other Father        poor circulation   Cancer Sister    Lung cancer Brother    Melanoma Brother    Diabetes Daughter    Colon cancer Neg Hx    Liver disease Neg Hx    Esophageal cancer Neg Hx    Social History   Socioeconomic History   Marital status: Widowed    Spouse name: Not on file   Number of children: 5   Years of education: Not on file   Highest education level: Not on file  Occupational History   Occupation: retired  Tobacco Use   Smoking status: Former    Current packs/day: 0.00    Average packs/day: 0.3 packs/day for 5.0 years (1.5 ttl pk-yrs)    Types: Cigarettes    Start date: 10/02/1981    Quit date: 10/02/1986    Years since quitting: 36.7   Smokeless tobacco: Never  Vaping Use   Vaping status: Never Used  Substance  and Sexual Activity   Alcohol use: No   Drug use: No   Sexual activity: Not on file  Other Topics Concern   Not on file  Social History Narrative   Not on file   Social Determinants of Health   Financial Resource Strain: Low Risk  (06/13/2023)   Overall Financial Resource Strain (CARDIA)    Difficulty of Paying Living Expenses: Not hard at all  Food Insecurity: No Food Insecurity (06/13/2023)   Hunger Vital Sign    Worried About Running Out of Food in the Last Year: Never true    Ran Out of Food in the Last Year: Never true  Transportation Needs: No Transportation Needs (06/13/2023)   PRAPARE - Administrator, Civil Service (Medical): No    Lack of Transportation (Non-Medical): No  Physical Activity: Inactive (06/13/2023)   Exercise Vital Sign    Days of Exercise per Week: 0 days    Minutes of Exercise per Session: 0 min  Stress: No Stress Concern Present (06/13/2023)   [employer] of Occupational Health - Occupational Stress Questionnaire    Feeling of Stress : Not at all  Social Connections: Moderately Integrated (06/13/2023)   Social Connection and Isolation Panel [NHANES]    Frequency of Communication with Friends and Family: More than three times a week    Frequency of Social Gatherings with Friends and Family: More than three times a week    Attends Religious Services: More than 4 times per year    Active Member of [employer] or Organizations: Yes    Attends Banker Meetings: 1 to 4 times per year    Marital Status: Widowed    Tobacco Counseling Counseling given: Not Answered   Clinical Intake:  Pre-visit preparation completed: Yes  Pain : No/denies pain     BMI - recorded: 33.98 Nutritional Status: BMI > 30  Obese Nutritional Risks: None Diabetes: Yes CBG done?: No Did pt. bring in CBG monitor from home?: No  How often do you need to have someone help you  when you read instructions, pamphlets, or other written materials from your  doctor or pharmacy?: 1 - Never  Interpreter Needed?: No  Information entered by :: Amarisa Wilinski,CMA   Activities of Daily Living    06/13/2023    3:01 PM  In your present state of health, do you have any difficulty performing the following activities:  Hearing? 0  Vision? 0  Difficulty concentrating or making decisions? 0  Walking or climbing stairs? 0  Dressing or bathing? 0  Doing errands, shopping? 0  Preparing Food and eating ? N  Using the Toilet? N  In the past six months, have you accidently leaked urine? N  Do you have problems with loss of bowel control? N  Managing your Medications? N  Managing your Finances? N  Housekeeping or managing your Housekeeping? N    Patient Care Team: Olive Bass, FNP as PCP - General (Internal Medicine) Pricilla Riffle, MD as PCP - Cardiology (Cardiology) Sharrell Ku, MD as Attending Physician (Gastroenterology) Beverely Low, MD as Attending Physician (Orthopedic Surgery) Nyoka Cowden, MD as Attending Physician (Pulmonary Disease) Jethro Bolus, MD as Consulting Physician (Ophthalmology)  Indicate any recent Medical Services you may have received from other than [facility] providers in the past year (date may be approximate).     Assessment:   This is a routine wellness examination for [facility].  Hearing/Vision screen No results found.   Goals Addressed   None    Depression Screen    06/13/2023    3:07 PM 09/22/2022    9:14 AM 07/13/2022   11:27 AM 04/06/2022    9:05 AM 01/24/2022    1:03 PM 09/21/2020    8:54 AM 08/27/2019   11:01 AM  PHQ 2/9 Scores  PHQ - 2 Score 0 0 0 0 0 0 0    Fall Risk    06/13/2023    3:03 PM 01/01/2023    3:15 PM 09/22/2022    9:14 AM 07/13/2022   11:27 AM 04/06/2022    9:05 AM  Fall Risk   Falls in the past year? 0 0 0 0 0  Number falls in past yr: 0 0  0 0  Injury with Fall? 0 0  0 0  Risk for fall due to : No Fall Risks   No Fall Risks No Fall Risks  Follow up Falls evaluation  completed   Falls evaluation completed Falls evaluation completed    MEDICARE RISK AT HOME: Medicare Risk at Home Any stairs in or around the home?: No If so, are there any without handrails?: No Home free of loose throw rugs in walkways, pet beds, electrical cords, etc?: Yes Adequate lighting in your home to reduce risk of falls?: Yes Life alert?: No Use of a cane, walker or w/c?: No Grab bars in the bathroom?: No Shower chair or bench in shower?: No Elevated toilet seat or a handicapped toilet?: No  TIMED UP AND GO:  Was the test performed?  No    Cognitive Function:        06/13/2023    3:09 PM 04/06/2022    9:21 AM  6CIT Screen  What Year? 0 points 0 points  What month? 0 points 0 points  What time? 0 points 0 points  Count back from 20 0 points 2 points  Months in reverse 0 points 0 points  Repeat phrase 4 points 4 points  Total Score 4 points 6 points    Immunizations Immunization History  Administered  Date(s) Administered   Fluad Quad(high Dose 65+) 06/12/2019, 10/08/2020, 09/28/2021, 07/20/2022   Influenza Split 07/17/2011, 07/05/2012   Influenza Whole 07/22/2008   Influenza, High Dose Seasonal PF 07/08/2018, 06/12/2019   Influenza,inj,Quad PF,6+ Mos 07/18/2013, 06/16/2014   Influenza-Unspecified 07/17/2011, 07/05/2012, 07/18/2013, 06/16/2014, 07/08/2018   PFIZER(Purple Top)SARS-COV-2 Vaccination 05/05/2020, 06/04/2020   Pneumococcal Conjugate-13 04/12/2016   Pneumococcal Polysaccharide-23 10/02/2006   Td 08/02/1998    TDAP status: Due, Education has been provided regarding the importance of this vaccine. Advised may receive this vaccine at local pharmacy or Health Dept. Aware to provide a copy of the vaccination record if obtained from local pharmacy or Health Dept. Verbalized acceptance and understanding.  Flu Vaccine status: Due, Education has been provided regarding the importance of this vaccine. Advised may receive this vaccine at local pharmacy or  Health Dept. Aware to provide a copy of the vaccination record if obtained from local pharmacy or Health Dept. Verbalized acceptance and understanding.  Pneumococcal vaccine status: Up to date  Covid-19 vaccine status: Information provided on how to obtain vaccines.   Qualifies for Shingles Vaccine? Yes   Zostavax completed No   Shingrix Completed?: No.    Education has been provided regarding the importance of this vaccine. Patient has been advised to call insurance company to determine out of pocket expense if they have not yet received this vaccine. Advised may also receive vaccine at local pharmacy or Health Dept. Verbalized acceptance and understanding.  Screening Tests Health Maintenance  Topic Date Due   Zoster Vaccines- Shingrix (1 of 2) Never done   DTaP/Tdap/Td (2 - Tdap) 08/02/2008   COVID-19 Vaccine (3 - Pfizer risk series) 07/02/2020   FOOT EXAM  03/11/2023   Medicare Annual Wellness (AWV)  04/07/2023   INFLUENZA VACCINE  05/03/2023   HEMOGLOBIN A1C  11/09/2023   OPHTHALMOLOGY EXAM  04/02/2024   Diabetic kidney evaluation - eGFR measurement  05/15/2024   Diabetic kidney evaluation - Urine ACR  05/15/2024   Pneumonia Vaccine 83+ Years old  Completed   DEXA SCAN  Completed   HPV VACCINES  Aged Out    Health Maintenance  Health Maintenance Due  Topic Date Due   Zoster Vaccines- Shingrix (1 of 2) Never done   DTaP/Tdap/Td (2 - Tdap) 08/02/2008   COVID-19 Vaccine (3 - Pfizer risk series) 07/02/2020   FOOT EXAM  03/11/2023   Medicare Annual Wellness (AWV)  04/07/2023   INFLUENZA VACCINE  05/03/2023    Colorectal cancer screening: No longer required.   Mammogram status: Completed 11/17/22. Repeat every year  Bone Density status: Ordered 06/13/23. Pt provided with contact info and advised to call to schedule appt.  Lung Cancer Screening: (Low Dose CT Chest recommended if Age 51-80 years, 20 pack-year currently smoking OR have quit w/in 15years.) does not qualify.    Additional Screening:  Hepatitis C Screening: does not qualify  Vision Screening: Recommended annual ophthalmology exams for early detection of glaucoma and other disorders of the eye. Is the patient up to date with their annual eye exam?  Yes  Who is the provider or what is the name of the office in which the patient attends annual eye exams? [facility] If pt is not established with a provider, would they like to be referred to a provider to establish care? No .   Dental Screening: Recommended annual dental exams for proper oral hygiene  Diabetic Foot Exam: Diabetic Foot Exam: Overdue, Pt has been advised about the importance in completing this exam.  Pt is scheduled for diabetic foot exam on N/a.  Community Resource Referral / Chronic Care Management: CRR required this visit?  No   CCM required this visit?  No     Plan:     I have personally reviewed and noted the following in the patient's chart:   Medical and social history Use of alcohol, tobacco or illicit drugs  Current medications and supplements including opioid prescriptions. Patient is not currently taking opioid prescriptions. Functional ability and status Nutritional status Physical activity Advanced directives List of other physicians Hospitalizations, surgeries, and ER visits in previous 12 months Vitals Screenings to include cognitive, depression, and falls Referrals and appointments  In addition, I have reviewed and discussed with patient certain preventive protocols, quality metrics, and best practice recommendations. A written personalized care plan for preventive services as well as general preventive health recommendations were provided to patient.     Donne Anon, CMA   06/13/2023   After Visit Summary: (MyChart) Due to this being a telephonic visit, the after visit summary with patients personalized plan was offered to patient via MyChart   Nurse Notes: None

## 2023-06-15 ENCOUNTER — Ambulatory Visit (INDEPENDENT_AMBULATORY_CARE_PROVIDER_SITE_OTHER): Payer: 59 | Admitting: Family

## 2023-06-15 ENCOUNTER — Encounter: Payer: Self-pay | Admitting: Family

## 2023-06-15 VITALS — BP 132/84 | HR 78 | Resp 19 | Ht 60.0 in | Wt 172.4 lb

## 2023-06-15 DIAGNOSIS — H9313 Tinnitus, bilateral: Secondary | ICD-10-CM | POA: Diagnosis not present

## 2023-06-15 DIAGNOSIS — Z23 Encounter for immunization: Secondary | ICD-10-CM | POA: Diagnosis not present

## 2023-06-15 DIAGNOSIS — H6123 Impacted cerumen, bilateral: Secondary | ICD-10-CM | POA: Diagnosis not present

## 2023-06-15 NOTE — Progress Notes (Signed)
[name] is a 87 y.o. female with the following history as recorded in [employer]:  Patient Active Problem List   Diagnosis Date Noted   Acute bronchitis 05/03/2023   Acute bacterial rhinosinusitis 05/03/2023   CKD (chronic kidney disease) stage 4, GFR 15-29 ml/min (HCC) 06/27/2022   Mitral stenosis 04/17/2022   Acute kidney injury superimposed on chronic kidney disease (HCC) 01/02/2022   NSTEMI (non-ST elevated myocardial infarction) (HCC) 12/23/2021   Osteoarthritis of knee 03/30/2021   Bilateral hand pain 03/08/2021   Complete heart block (HCC) 12/13/2020   Pacemaker - MDT 12/13/2020   DOE (dyspnea on exertion) 07/29/2020   Encntr for surgical aftcr following surgery on the circ sys 07/26/2020   Pain in right knee 03/17/2020   Chronic combined systolic (congestive) and diastolic (congestive) heart failure (HCC)    DKA (diabetic ketoacidoses) 12/20/2019   Diverticulosis 12/10/2019   Age-related osteoporosis without current pathological fracture 10/03/2019   Chronic kidney disease, stage 3 unspecified (HCC) 10/03/2019   Long term (current) use of insulin (HCC) 10/03/2019   Hyperlipidemia, unspecified 10/03/2019   Shortness of breath 01/30/2018   Acute bronchiolitis 12/17/2017   Wheezing 10/29/2017   Vitamin D deficiency 07/31/2017   Renal insufficiency 07/31/2017   Knee pain 06/01/2017   Morbid obesity due to excess calories (HCC) 01/25/2017   Diabetes (HCC) 04/12/2016   Cough 01/20/2015   Atrial fibrillation (HCC) 05/28/2014   Personal history of colonic polyps 05/28/2014   Vomiting 01/29/2014   CAD (coronary artery disease) 09/01/2013   Routine general medical examination at a health care facility 07/17/2011   Encounter for long-term (current) use of other medications 07/06/2011   Nonspecific (abnormal) findings on radiological and other examination of body structure 11/09/2009   IRRITABLE BOWEL SYNDROME 05/07/2009   CHEST PAIN 05/07/2009   ADNEXAL MASS, RIGHT  07/22/2008   HEMORRHOIDS, RECURRENT 01/27/2008   Diverticulitis 01/27/2008   OTH ABNORMAL FIND RAD EXAMINATION BREAST 01/27/2008   GERD 01/13/2008   UTI 09/28/2007   Dyslipidemia 05/27/2007   ANEMIA-IRON DEFICIENCY 05/27/2007   ANXIETY 05/27/2007   Essential hypertension 05/27/2007   Seasonal allergic rhinitis 05/27/2007   Cough variant asthma 05/27/2007   Osteoporosis 05/27/2007   DIVERTICULITIS, HX OF 05/27/2007    Current Outpatient Medications  Medication Sig Dispense Refill   acetaminophen (TYLENOL) 325 MG tablet Take 650 mg by mouth every 6 (six) hours as needed for pain.     albuterol (PROAIR HFA) 108 (90 Base) MCG/ACT inhaler 2 puffs every 4 hours as needed only  if your can't catch your breath 18 g 11   albuterol (PROVENTIL) (2.5 MG/3ML) 0.083% nebulizer solution Take 3 mLs (2.5 mg total) by nebulization every 6 (six) hours as needed for wheezing or shortness of breath. 150 mL 1   Ascorbic Acid (VITAMIN C) 1000 MG tablet Take 1,000 mg by mouth daily.     atorvastatin (LIPITOR) 80 MG tablet Take 1 tablet (80 mg total) by mouth daily. 90 tablet 3   BD VEO INSULIN SYRINGE U/F 31G X 15/64" 0.5 ML MISC USE AS INSTRUCTED 100 each 3   benzonatate (TESSALON) 200 MG capsule Take 1 capsule (200 mg total) by mouth 3 (three) times daily as needed. 30 capsule 1   Blood Glucose Monitoring Suppl (ONETOUCH VERIO FLEX SYSTEM) w/Device KIT USE AS DIRECTED 1 kit .   budesonide-formoterol (SYMBICORT) 160-4.5 MCG/ACT inhaler TAKE 2 PUFFS FIRST THING IN AM AND THEN ANOTHER 2 PUFFS ABOUT 12 HOURS LATER. 30.6 each 6   carvedilol (  COREG) 3.125 MG tablet Take 1 tablet (3.125 mg total) by mouth 2 (two) times daily with a meal. 180 tablet 1   cetirizine (ZYRTEC ALLERGY) 10 MG tablet Take 1 tablet (10 mg total) by mouth daily as needed for allergies. 30 tablet 5   cholecalciferol (VITAMIN D3) 25 MCG (1000 UNIT) tablet Take 1,000 Units by mouth daily. Isn't taking regularly     clopidogrel (PLAVIX) 75 MG  tablet TAKE 1 TABLET BY MOUTH DAILY WITH BREAKFAST. 90 tablet 2   diclofenac Sodium (VOLTAREN) 1 % GEL Apply 4 g topically 4 (four) times daily as needed (pain).     ELIQUIS 2.5 MG TABS tablet TAKE 1 TABLET BY MOUTH TWICE A DAY 60 tablet 5   famotidine (PEPCID) 20 MG tablet Take 1 tablet (20 mg total) by mouth 2 (two) times daily. 180 tablet 1   FARXIGA 5 MG TABS tablet TAKE 1 TABLET BY MOUTH EVERY DAY BEFORE BREAKFAST 30 tablet 3   fluconazole (DIFLUCAN) 100 MG tablet Take 2 tablets by mouth today, then take 1 tablet by mouth once daily for 13 days. 15 tablet 0   fluticasone (FLONASE) 50 MCG/ACT nasal spray Place 2 sprays into both nostrils daily. 16 g 5   furosemide (LASIX) 40 MG tablet Take 1 tablet (40 mg total) by mouth 2 (two) times daily. 180 tablet 3   glucose blood (ONETOUCH VERIO) test strip 1 each by Other route 2 (two) times daily. And lancets 2/day 200 each 3   glucose blood test strip Check blood sugar twice a day 100 each 12   hydrALAZINE (APRESOLINE) 25 MG tablet TAKE 3 TABLETS (75 MG TOTAL) BY MOUTH 3 TIMES A DAY 270 tablet 0   insulin lispro (HUMALOG) 100 UNIT/ML injection Give 3 units with BREAKFAST, AND 18 units with SUPPER 60 mL 1   latanoprost (XALATAN) 0.005 % ophthalmic solution Place 1 drop into both eyes at bedtime.     montelukast (SINGULAIR) 10 MG tablet TAKE 1 TABLET BY MOUTH EVERYDAY AT BEDTIME 90 tablet 1   nitroGLYCERIN (NITROSTAT) 0.4 MG SL tablet Place 1 tablet (0.4 mg total) under the tongue every 5 (five) minutes x 3 doses as needed for chest pain. 25 tablet 2   ondansetron (ZOFRAN) 8 MG tablet Take 1 tablet (8 mg total) by mouth every 8 (eight) hours as needed for nausea or vomiting. 20 tablet 0   pantoprazole (PROTONIX) 40 MG tablet TAKE 1 TABLET BY MOUTH TWICE A DAY 180 tablet 3   potassium chloride (KLOR-CON) 10 MEQ tablet TAKE 1 TABLET BY MOUTH EVERY DAY 90 tablet 1   promethazine-dextromethorphan (PROMETHAZINE-DM) 6.25-15 MG/5ML syrup Take 5 mLs by mouth  every 8 (eight) hours as needed for cough. 180 mL 0   sodium bicarbonate 650 MG tablet Take 650 mg by mouth 2 (two) times daily.     Syringe/Needle, Disp, (SYRINGE 3CC/25GX1") 25G X 1" 3 ML MISC Use to inject into the skin 2x a day 100 each 3   vitamin B-12 (CYANOCOBALAMIN) 100 MCG tablet Take 100 mcg by mouth daily.     Current Facility-Administered Medications  Medication Dose Route Frequency Provider Last Rate Last Admin   ipratropium-albuterol (DUONEB) 0.5-2.5 (3) MG/3ML nebulizer solution 3 mL  3 mL Nebulization Once Olive Bass, FNP        Allergies: Aspirin and Ramipril  Past Medical History:  Diagnosis Date   Allergic rhinitis    Anterior chest wall pain    Anxiety    Asthma  Chronic diastolic CHF (congestive heart failure) (HCC)    Echo 01/2020: EF 60-65, normal wall motion, mild LVH, normal RV SF, RVSP 52.6 (moderate elevation), severe LAE, moderate RAE, trivial MR, mild MS (mean gradient 5.5 mmHg), mild aortic valve sclerosis (no AS); elevated E/e' c/w elevated LVEDP   Cough    DM type 2 (diabetes mellitus, type 2) (HCC)    GERD (gastroesophageal reflux disease)    History of diverticulitis of colon    HTN (hypertension)    Hyperlipidemia    IBS (irritable bowel syndrome)    Iron deficiency anemia    Mitral stenosis 04/17/2022   Echo 04/2022: EF 55-60, no RWMA, mild LVH, normal RVSF, normal PASP, moderate LAE, moderate mitral stenosis (mean gradient 4 mmHg), trivial MR, trivial AI, AV sclerosis without stenosis   Osteoporosis, unspecified    Renal insufficiency 07/31/2017   UTI (urinary tract infection)     Past Surgical History:  Procedure Laterality Date   CARDIOVASCULAR STRESS TEST  02/25/04   CORONARY BALLOON ANGIOPLASTY N/A 12/28/2021   Procedure: CORONARY BALLOON ANGIOPLASTY;  Surgeon: Kathleene Hazel, MD;  Location: [facility];  Service: Cardiovascular;  Laterality: N/A;   CORONARY STENT INTERVENTION N/A 12/28/2021   Procedure: CORONARY  STENT INTERVENTION;  Surgeon: Kathleene Hazel, MD;  Location: [facility];  Service: Cardiovascular;  Laterality: N/A;   ESOPHAGOGASTRODUODENOSCOPY  12/26/01   PACEMAKER IMPLANT N/A 08/02/2020   Procedure: PACEMAKER IMPLANT;  Surgeon: Duke Salvia, MD;  Location[facility];  Service: Cardiovascular;  Laterality: N/A;   RIGHT OOPHORECTOMY  jan 2010   RIGHT/LEFT HEART CATH AND CORONARY ANGIOGRAPHY N/A 12/28/2021   Procedure: RIGHT/LEFT HEART CATH AND CORONARY ANGIOGRAPHY;  Surgeon: Kathleene Hazel, MD;  Location: [facility];  Service: Cardiovascular;  Laterality: N/A;    Family History  Problem Relation Age of Onset   Hypertension Mother    Stroke Mother    Other Father        poor circulation   Cancer Sister    Lung cancer Brother    Melanoma Brother    Diabetes Daughter    Colon cancer Neg Hx    Liver disease Neg Hx    Esophageal cancer Neg Hx     Social History   Tobacco Use   Smoking status: Former    Current packs/day: 0.00    Average packs/day: 0.3 packs/day for 5.0 years (1.5 ttl pk-yrs)    Types: Cigarettes    Start date: 10/02/1981    Quit date: 10/02/1986    Years since quitting: 36.7   Smokeless tobacco: Never  Substance Use Topics   Alcohol use: No    Subjective:   Patient presents with ringing in her left ear- symptoms present "for a long time"- concerned that seems to be worsening; daughter is present for exam;    Objective:  Vitals:   06/15/23 0920  BP: 132/84  Pulse: 78  Resp: 19  SpO2: 100%  Weight: 172 lb 6.4 oz (78.2 kg)  Height: 5' (1.524 m)    General: Well developed, well nourished, in no acute distress  Skin : Warm and dry.  Head: Normocephalic and atraumatic  Eyes: Sclera and conjunctiva clear; pupils round and reactive to light; extraocular movements intact  Ears: External normal; on initial exam, bilateral cerumen impaction; after lavage, canals clear; tympanic membranes normal  Oropharynx: Pink,  supple. No suspicious lesions  Neck: Supple without thyromegaly, adenopathy  Lungs: Respirations unlabored; clear to  auscultation bilaterally without wheeze, rales, rhonchi  CVS exam: normal rate and regular rhythm.  Neurologic: Alert and oriented; speech intact; face symmetrical; moves all extremities well; CNII-XII intact without focal deficit   Assessment:  1. Tinnitus of both ears   2. Bilateral impacted cerumen     Plan:  Refer to ENT per patient request- she would like to do hearing test;  Ear lavage completed with no difficulty;   No follow-ups on file.  Orders Placed This Encounter  Procedures   Ambulatory referral to ENT    Referral Priority:   Routine    Referral Type:   Consultation    Referral Reason:   Specialty Services Required    Requested Specialty:   Otolaryngology    Number of Visits Requested:   1    Requested Prescriptions    No prescriptions requested or ordered in this encounter

## 2023-06-17 ENCOUNTER — Other Ambulatory Visit: Payer: Self-pay | Admitting: Internal Medicine

## 2023-06-19 ENCOUNTER — Other Ambulatory Visit: Payer: Self-pay | Admitting: Family

## 2023-06-22 ENCOUNTER — Ambulatory Visit (HOSPITAL_COMMUNITY)
Admission: RE | Admit: 2023-06-22 | Discharge: 2023-06-22 | Disposition: A | Discharge status: Home / Self Care | Payer: 59 | Source: Ambulatory Visit | Attending: Internal Medicine | Admitting: Internal Medicine

## 2023-06-22 DIAGNOSIS — R1310 Dysphagia, unspecified: Secondary | ICD-10-CM | POA: Diagnosis not present

## 2023-06-22 DIAGNOSIS — Z4682 Encounter for fitting and adjustment of non-vascular catheter: Secondary | ICD-10-CM | POA: Diagnosis not present

## 2023-06-22 DIAGNOSIS — K2289 Other specified disease of esophagus: Secondary | ICD-10-CM | POA: Diagnosis not present

## 2023-06-22 DIAGNOSIS — K449 Diaphragmatic hernia without obstruction or gangrene: Secondary | ICD-10-CM | POA: Diagnosis not present

## 2023-06-25 ENCOUNTER — Telehealth: Payer: Self-pay

## 2023-06-25 NOTE — Progress Notes (Signed)
Spoke to the patient about the results of her barium swallow, which suggested that there were contractions and spasms in the esophagus, which delayed emptying into the stomach.  She was also noted to have a small hiatal hernia and her barium tablet did impact at above the hiatal hernia.  Patient does state that her dysphagia has improved slightly on fluconazole therapy.  I asked if she would be interested in getting an EGD scheduled at this time.  She would like to think about the EGD procedure some more and will plan to talk about it with me during her next GI clinic appointment.

## 2023-06-25 NOTE — Telephone Encounter (Signed)
Pt advised that she needs both for different DX/ Afib and CAD... she can talk with Dr Tenny Craw at her 07/2023 OV if it is time for her to D/C the Plavix.Marland Kitchen if it has been enough time since her stent.

## 2023-06-25 NOTE — Telephone Encounter (Signed)
Pts daughter called regarding medication. Pts daughter states that pts pharmacy is concerned with pt taking Plavix and Eliquis. Pts daughter ask for a call back. She can be reached at [phone]. Please address. Thank you

## 2023-06-26 ENCOUNTER — Other Ambulatory Visit: Payer: Self-pay | Admitting: Internal Medicine

## 2023-06-26 DIAGNOSIS — I4891 Unspecified atrial fibrillation: Secondary | ICD-10-CM

## 2023-06-27 ENCOUNTER — Telehealth: Payer: Self-pay | Admitting: Internal Medicine

## 2023-06-27 NOTE — Telephone Encounter (Addendum)
Patient had a barium swallow test on 06/22/23. She passed "a little" stool the next day. She has not passed stool well since. She has taken one dose of Miralax without results yet. She agrees to take a daily dose until she produces a bowel movement. Daughter asks if MOM would be a good option.

## 2023-06-27 NOTE — Telephone Encounter (Signed)
Prescription refill request for Eliquis received. Indication: PAF Last office visit: 10/04/22  Lovina Reach MD Scr: 2.84 on 05/15/23  Epic Age: 87 Weight: 80kg  Based on above findings Eliquis 2.5mg  twice daily is the appropriate dose.  Refill approved.

## 2023-06-27 NOTE — Telephone Encounter (Signed)
Inbound call from patient daughter stating patient patient has been experiencing constipation sine having test done on 9/20. Patient daughter requesting a call back to discuss further. Please advise, thank you.

## 2023-06-29 NOTE — Telephone Encounter (Signed)
Spoke with the patient's daughter and advised of the recommendation. Daughter Ms. Claudine Mouton, reports the patient is feeling better and is beginning to have some movement of her bowels.

## 2023-07-01 ENCOUNTER — Other Ambulatory Visit: Payer: Self-pay | Admitting: Physician Assistant

## 2023-07-02 DIAGNOSIS — H401130 Primary open-angle glaucoma, bilateral, stage unspecified: Secondary | ICD-10-CM | POA: Diagnosis not present

## 2023-07-06 ENCOUNTER — Encounter: Payer: Self-pay | Admitting: Internal Medicine

## 2023-07-06 ENCOUNTER — Ambulatory Visit: Payer: 59 | Admitting: Internal Medicine

## 2023-07-06 VITALS — BP 126/82 | HR 79 | Temp 98.3°F | Ht 66.0 in | Wt 168.4 lb

## 2023-07-06 DIAGNOSIS — B9689 Other specified bacterial agents as the cause of diseases classified elsewhere: Secondary | ICD-10-CM

## 2023-07-06 DIAGNOSIS — J209 Acute bronchitis, unspecified: Secondary | ICD-10-CM

## 2023-07-06 DIAGNOSIS — J45991 Cough variant asthma: Secondary | ICD-10-CM | POA: Diagnosis not present

## 2023-07-06 DIAGNOSIS — J019 Acute sinusitis, unspecified: Secondary | ICD-10-CM | POA: Diagnosis not present

## 2023-07-06 DIAGNOSIS — R0609 Other forms of dyspnea: Secondary | ICD-10-CM | POA: Diagnosis not present

## 2023-07-06 MED ORDER — METHYLPREDNISOLONE ACETATE 80 MG/ML IJ SUSP
120.0000 mg | Freq: Once | INTRAMUSCULAR | Status: AC
Start: 2023-07-06 — End: 2023-07-06
  Administered 2023-07-06: 120 mg via INTRAMUSCULAR

## 2023-07-06 MED ORDER — PROMETHAZINE-DM 6.25-15 MG/5ML PO SYRP
5.0000 mL | ORAL_SOLUTION | Freq: Four times a day (QID) | ORAL | 0 refills | Status: DC | PRN
Start: 2023-07-06 — End: 2023-08-16

## 2023-07-06 NOTE — Progress Notes (Unsigned)
Patient ID: [name], female   DOB: [birth year] .   MRN: [id number]  Brief patient profile:  87 yobf quit smoking 1988 with h/o asthma  And nl baseline pfts ( as of 01/04/2010) who had been using Advair on a p.r.n. basis and noticing increasing dyspnea and need for rescue therapy x one mo when seen 01/06/08 for pulmonary evaluation so Advair stopped. Began Symbicort 160/4.49mcg 2 puffs two times a day.  Returned 02/13/08 improved with decreased dyspnea and no rescue use   Returned 03/18/08 symptom free no rescue needed, stopped reglan, no flare   05/04/2020  f/u ov/[name] re: asthma/ rhinitis not sure about ppi/ using benadryl helps some  Chief Complaint  Patient presents with   Follow-up    Increased SOB and chest tightness over the past month. She has some cough- prod with min light yellow sputum. She is using her albuterol inhaler about 2 x per day.   Dyspnea:  MMRC2 = can't walk a nl pace on a flat grade s sob but does fine slow and flat  Cough: more cough assoc with pnds day > noct/ not taking omeprazole  Sleeping: 2 pillows ok  SABA use: 2x daily  02: none  rec Plan A = Automatic = Always=    Symbicort 160 Take 2 puffs first thing in am and then another 2 puffs about 12 hours later.  Work on inhaler technique:    Omerprazole 40 mg Take 30-60 min before first meal of the day  Prednisone 10 mg take  4 each am x 2 days,   2 each am x 2 days,  1 each am x 2 days and stop  Plan B = Backup (to supplement plan A, not to replace it) Only use your albuterol inhaler as a rescue medication         07/04/2021  f/u ov/[name] re: cough variant asthma   maint on symbicort 160 2bid/ singulair  - no longer on gerd rx  Chief Complaint  Patient presents with   Follow-up    Still coughing some   Dyspnea:  baseline still MMRC2 = can't walk a nl pace on a flat grade s sob but does fine slow and flat eg Mall walking  Cough: worse x one week, slt yellow not on gerd rx  Sleeping: flat bed/ 2 pillows- no  noct symptoms SABA use: started up using it one week prior to OV  twice daily  02: none  Covid status:   2 vax / never got covid  Rec At onset cough the 1st thing you should do :  Try prilosec otc 20mg   Take 30-60 min before first meal of the day and Pepcid ac (famotidine) 20 mg one @  bedtime until cough is completely gone   Prednisone 10 mg take  4 each am x 2 days,   2 each am x 2 days,  1 each am x 2 days and stop  Zpak   12/28/21 LHC/RHC   Mid RCA lesion is 30% stenosed.   Dist RCA lesion is 50% stenosed.   1st Diag lesion is 90% stenosed.   Prox LAD to Mid LAD lesion is 95% stenosed.   A drug-eluting stent was successfully placed using a SYNERGY XD 3.0X16.   Balloon angioplasty was performed using a BALLN SAPPHIRE 2.0X12.   Post intervention, there is a 0% residual stenosis.   Post intervention, there is a 30% residual stenosis.   Severe proximal LAD stenosis involving  a moderate caliber diagonal branch.  Successful PTCA/DES x 1 proximal LAD Successful balloon angioplasty Diagonal 1 No obstructive disease in the Circumflex artery Moderate non-obstructive disease in the dominant RCA Elevated right and left heart pressures. (Wedge 27)     01/25/2022  f/u ov/[name] re: cough variant asthma  maint on symbicort 160 /singulair   Chief Complaint  Patient presents with   Follow-up    Patient is having shortness of breath and weakness over the last month. She was admitted into the hospital in March for heart attack. Productive/dry cough with yellow sputum. Patient currently on prednisone.   Dyspnea:  very sedentary since heart cath above  Cough: sporadic / rattles/ slt yellow mucus Sleeping: flat 2 pillows bed once a week needs at third pillow but no albuterol SABA use: until 01/24/22 did not have any / then neb added and took am 2 h prior to OV   02: none  Covid status:  vax x 2, never covid infection  Rec Plan A = Automatic = Always=    Symbicort 160 (Breztri) Take 2 puffs first  thing in am and then another 2 puffs about 12 hours later.  Work on inhaler technique:   Plan B = Backup (to supplement plan A, not to replace it) Only use your albuterol inhaler as a rescue medication Plan C = Crisis (instead of Plan B but only if Plan B stops working) - only use your albuterol nebulizer if you first try Plan B  At onset cough (if you are not already on Protonix which should be twice daily 30 min before meals) the 1st thing you should do :  Try prilosec otc 20mg   Take 30-60 min before first meal of the day and Pepcid ac (famotidine) 20 mg one @  bedtime until cough is completely gone   Zpak  Please remember to go to the  x-ray department : cxr ok    07/31/2022  f/u ov/[name] re: cough variant asthma  maint on symbicort   No chief complaint on file.  Dyspnea:  can do belks pushing the cart with HC parking  Cough: sporadic Sleeping: flat bed / 2 pillows  SABA use: once or twice a week 02: none  Rec No change rx  Onset around middle July 2024 bad cough July 19 zpak then prednisone    NP eval 05/03/23  Continue Albuterol inhaler 2 puffs or 3 mL neb every 6 hours as needed for shortness of breath or wheezing. Notify if symptoms persist despite rescue inhaler/neb use.    Use your nebs three times a day then follow with flutter valve ten times  Continue Symbicort 2 puffs Twice daily. Brush tongue and rinse mouth afterwards Continue zyrtec 1 tab daily for allergies Continue singulair 1 tab At bedtime for allergies/asthma   -Prednisone taper. 3 tabs for 3 days, 2 tabs for 3 days, then 1 tab for 3 days, then stop. Take in AM with food -Doxycycline 1 tab Twice daily for 10 days. Take with food.  -Promethazine DM cough syrup 5 mL every 8 hours as needed for cough. May cause drowsiness. Do not drive after taking -Benzonatate 1 capsule Three times a day as needed for cough -Saline nasal rinses twice a day until sinus symptoms resolve. Use bottled distilled water and sodium chloride  packets for these. Follow 20-30 minutes later with flonase nasal spray 2 sprays each nostril in the morning  -Guaifenesin (mucinex) [phone] mg Twice daily for cough/congestion    05/16/2023  f/u ov/[name] re: bad cough since mid July 2024 assoc  recurrent chest tightness maint on symbicort 160 Take 2 puffs first thing in am and then another 2 puffs about 12 hours later.  Chief Complaint  Patient presents with   Follow-up    C/o some chest tightness.   Dyspnea: chest tightness x weeks at rest, less noticeable with walking and ? Some better p neb  - does not correlate with nausea which started p doxy rx  Cough: dry cough Lynett Grimes her up most nights ? Using mucinex dm/ flutter valve as rec/ not using flutter   Sleeping: flat bed 2 pillows  SABA use: not using hfa at all  / neb 2-3 x daily  02: none  Rec Plan A = Automatic = Always=    Symbicort 160 Take 2 puffs first thing in am and then another 2 puffs about 12 hours later.  Work on inhaler technique:  Plan B = Backup (to supplement plan A, not to replace it) Only use your albuterol inhaler as a rescue medication Plan C = Crisis (instead of Plan B but only if Plan B stops working) - only use your albuterol nebulizer if you first try Plan B  Continue pantoprazole 40 mg Take 30- 60 min before your first and last meals of the day and pepcid 20 mg one at bedtime  GERD diet reviewed, bed blocks   For cough/ congestion > mucinex or mucinex dm  up to maximum of  1200 mg every 12 hours and use the flutter valve as much as you can   Depomedrol 120 mg IM   Please schedule a follow up office visit in 6 weeks, call sooner if needed with all medications /inhalers/ solutions in hand 2 bags   07/06/2023  f/u ov/[name] re: asthma maint on symbicort 160 did not  bring meds  Chief Complaint  Patient presents with   Follow-up    Chest feels tight today  Dyspnea:  75 ft  Cough: dry  sporadic worse with walking  Sleeping: flat bed 2 -3 pillows s resp cc  SABA  use: last used hfa > 12 h prior to OV  / neb  02: none   Lung cancer screening :  ***   No obvious day to day or daytime variability or assoc excess/ purulent sputum or mucus plugs or hemoptysis or cp or chest tightness, subjective wheeze or overt sinus or hb symptoms.    Also denies any obvious fluctuation of symptoms with weather or environmental changes or other aggravating or alleviating factors except as outlined above   No unusual exposure hx or h/o childhood pna/ asthma or knowledge of premature birth.  Current Allergies, Complete Past Medical History, Past Surgical History, Family History, and Social History were reviewed in [employer] record.  ROS  The following are not active complaints unless bolded Hoarseness, sore throat, dysphagia, dental problems, itching, sneezing,  nasal congestion or discharge of excess mucus or purulent secretions, ear ache,   fever, chills, sweats, unintended wt loss or wt gain, classically pleuritic or exertional cp,  orthopnea pnd or arm/hand swelling  or leg swelling, presyncope, palpitations, abdominal pain, anorexia, nausea, vomiting, diarrhea  or change in bowel habits or change in bladder habits, change in stools or change in urine, dysuria, hematuria,  rash, arthralgias, visual complaints, headache, numbness, weakness or ataxia or problems with walking or coordination,  change in mood or  memory.        Current  Meds  Medication Sig   acetaminophen (TYLENOL) 325 MG tablet Take 650 mg by mouth every 6 (six) hours as needed for pain.   albuterol (PROAIR HFA) 108 (90 Base) MCG/ACT inhaler 2 puffs every 4 hours as needed only  if your can't catch your breath   albuterol (PROVENTIL) (2.5 MG/3ML) 0.083% nebulizer solution Take 3 mLs (2.5 mg total) by nebulization every 6 (six) hours as needed for wheezing or shortness of breath.   Ascorbic Acid (VITAMIN C) 1000 MG tablet Take 1,000 mg by mouth daily.   atorvastatin (LIPITOR) 80 MG  tablet TAKE 1 TABLET BY MOUTH EVERY DAY   BD VEO INSULIN SYRINGE U/F 31G X 15/64" 0.5 ML MISC USE AS INSTRUCTED   benzonatate (TESSALON) 200 MG capsule Take 1 capsule (200 mg total) by mouth 3 (three) times daily as needed.   Blood Glucose Monitoring Suppl (ONETOUCH VERIO FLEX SYSTEM) w/Device KIT USE AS DIRECTED   budesonide-formoterol (SYMBICORT) 160-4.5 MCG/ACT inhaler TAKE 2 PUFFS FIRST THING IN AM AND THEN ANOTHER 2 PUFFS ABOUT 12 HOURS LATER.   carvedilol (COREG) 3.125 MG tablet Take 1 tablet (3.125 mg total) by mouth 2 (two) times daily with a meal.   cetirizine (ZYRTEC ALLERGY) 10 MG tablet Take 1 tablet (10 mg total) by mouth daily as needed for allergies.   cholecalciferol (VITAMIN D3) 25 MCG (1000 UNIT) tablet Take 1,000 Units by mouth daily. Isn't taking regularly   clopidogrel (PLAVIX) 75 MG tablet TAKE 1 TABLET BY MOUTH DAILY WITH BREAKFAST.   diclofenac Sodium (VOLTAREN) 1 % GEL Apply 4 g topically 4 (four) times daily as needed (pain).   ELIQUIS 2.5 MG TABS tablet TAKE 1 TABLET BY MOUTH TWICE A DAY   famotidine (PEPCID) 20 MG tablet TAKE 1 TABLET BY MOUTH TWICE A DAY   FARXIGA 5 MG TABS tablet TAKE 1 TABLET BY MOUTH EVERY DAY BEFORE BREAKFAST   fluconazole (DIFLUCAN) 100 MG tablet Take 2 tablets by mouth today, then take 1 tablet by mouth once daily for 13 days.   fluticasone (FLONASE) 50 MCG/ACT nasal spray Place 2 sprays into both nostrils daily.   furosemide (LASIX) 40 MG tablet Take 1 tablet (40 mg total) by mouth 2 (two) times daily.   glucose blood (ONETOUCH VERIO) test strip 1 each by Other route 2 (two) times daily. And lancets 2/day   glucose blood test strip Check blood sugar twice a day   hydrALAZINE (APRESOLINE) 25 MG tablet Take 1 tablet (25 mg total) by mouth 3 (three) times daily.   insulin lispro (HUMALOG) 100 UNIT/ML injection Give 3 units with BREAKFAST, AND 18 units with SUPPER   latanoprost (XALATAN) 0.005 % ophthalmic solution Place 1 drop into both eyes at  bedtime.   montelukast (SINGULAIR) 10 MG tablet TAKE 1 TABLET BY MOUTH EVERYDAY AT BEDTIME   nitroGLYCERIN (NITROSTAT) 0.4 MG SL tablet Place 1 tablet (0.4 mg total) under the tongue every 5 (five) minutes x 3 doses as needed for chest pain.   ondansetron (ZOFRAN) 8 MG tablet Take 1 tablet (8 mg total) by mouth every 8 (eight) hours as needed for nausea or vomiting.   pantoprazole (PROTONIX) 40 MG tablet TAKE 1 TABLET BY MOUTH TWICE A DAY   potassium chloride (KLOR-CON) 10 MEQ tablet TAKE 1 TABLET BY MOUTH EVERY DAY   sodium bicarbonate 650 MG tablet Take 650 mg by mouth 2 (two) times daily.   Syringe/Needle, Disp, (SYRINGE 3CC/25GX1") 25G X 1" 3 ML MISC Use to inject into  the skin 2x a day   vitamin B-12 (CYANOCOBALAMIN) 100 MCG tablet Take 100 mcg by mouth daily.   Current Facility-Administered Medications for the 07/06/23 encounter (Office Visit) with Nyoka Cowden, MD  Medication   ipratropium-albuterol (DUONEB) 0.5-2.5 (3) MG/3ML nebulizer solution 3 mL             Past Medical History:   OSTEOPOROSIS (ICD-733.00)  HYPERTENSION (ICD-401.9)  HYPERLIPIDEMIA (ICD-272.4)  DIVERTICULITIS, HX OF (ICD-V12.79)  DIABETES MELLITUS, TYPE II (ICD-250.00)  COLON CANCER, HX OF (ICD-V10.05)  ASTHMA (ICD-493.90)  -PFT's 03/18/08 minimal airflow obstruction  -PFT's January 04, 2010 No sign airflow obstruction  -Mastered HFA technique August 13, 2008 > confimed November 23, 2009  ANXIETY (ICD-300.00)  ANEMIA-IRON DEFICIENCY (ICD-280.9)  ALLERGIC RHINITIS (ICD-477.9)  IBS  GERD            Objective:   Physical Exam  Wts   07/06/2023    168   05/16/2023    168  07/31/2022  173  01/25/2022    167   07/04/2021    182 11/04/2020      181 05/04/2020      185 05/05/2019      196  wt 202 November 23, 2009> 199 January 04, 2010 > 209  03/22/11 > 214 10/16/2013 >  01/25/2017   193> 01/28/2018   200    Vital signs reviewed  07/06/2023  - Note at rest 02 sats  ***% on ***   General appearance:     ***  pan exp wheeze/ component of pseudowhweeze during coughing fits on exp maneuvers***      I personally reviewed images and agree with radiology impression as follows:   Chest CT w/o contrast   04/27/23   1. Bronchial wall thickening and mucous plugging in the lower lobes greatest in the left lower lobe. 2. Small hiatal hernia with mild wall thickening of the lower esophagus suggesting esophagitis.     Labs ordered/ reviewed:      Chemistry      Component Value Date/Time   NA 134 (L) 05/16/2023 0930   NA 141 08/23/2022 0000   K 4.4 05/16/2023 0930   CL 96 05/16/2023 0930   CO2 25 05/16/2023 0930   BUN 53 (H) 05/16/2023 0930   BUN 46 (A) 08/23/2022 0000   CREATININE 2.82 (H) 05/16/2023 0930   CREATININE 1.74 (H) 08/28/2016 1042   GLU 129 08/23/2022 0000      Component Value Date/Time   CALCIUM 9.2 05/16/2023 0930   CALCIUM 10.2 07/06/2011 1144   ALKPHOS 102 05/01/2023 1141   AST 21 05/01/2023 1141   ALT 35 05/01/2023 1141   BILITOT 0.8 05/01/2023 1141     Lab Results  Component Value Date   CREATININE 2.82 (H) 05/16/2023   CREATININE 2.74 (H) 05/01/2023   CREATININE 2.48 (H) 04/27/2023          Assessment:

## 2023-07-06 NOTE — Patient Instructions (Addendum)
Depomedrol 120 mg IM today   For cough > phenergan dm up to 1 -2 tsp every  4 hours as meds    Please keep your follow up appt and  call sooner if needed with all medications /inhalers/ solutions in hand so we can verify exactly what you are taking. This includes all medications from all doctors and over the counters - PLEASE separate them into two bags:  the ones you take automatically, no matter what, vs the ones you take just when you feel you need them "BAG #2 is UP TO YOU"  - this will really help [place] help you take your medications more effectively.

## 2023-07-07 ENCOUNTER — Other Ambulatory Visit: Payer: Self-pay | Admitting: Internal Medicine

## 2023-07-07 ENCOUNTER — Encounter: Payer: Self-pay | Admitting: Internal Medicine

## 2023-07-07 NOTE — Assessment & Plan Note (Addendum)
07/06/2023   Walked on RA  x   approx 75 slow  ft  @ slow pace, stopped due to fatigue > sob   with lowest 02 sats 99%   - Does not appear to have a respiratory limitation to exertion but getting more deconditioned and will need additional w/u at next ov if not done in meantime by PCP   Each maintenance medication was reviewed in detail including emphasizing most importantly the difference between maintenance and prns and under what circumstances the prns are to be triggered using an action plan format where appropriate.  Total time for H and P, chart review, counseling, reviewing hfa/neb device(s) , directly observing portions of ambulatory 02 saturation study/ and generating customized AVS unique to this office visit / same day charting > 30 min for multiple  refractory respiratory  symptoms of uncertain etiology

## 2023-07-07 NOTE — Assessment & Plan Note (Addendum)
PFT's 03/18/08 minimal airflow obstruction  -PFT's January 04, 2010 No sign airflow obstruction   - FENO 01/25/2017  =   23 p am symb 160 x 2 pffs - Spirometry 01/25/2017  wnl x for min curvature on f/v p am symb 160 x 2 / no saba prior - 07/30/2017   After extensive coaching HFA effectiveness =    75% from baseline 50%  - Allergy profile 07/30/17  >  Eos 0.2 /  IgE  145  RAST pos mold  - FENO 01/28/2018  =   17 - Spirometry 01/28/2018  FEV1 1.22 (65%)  Ratio 79  - Allergy screen 05/16/23 >  Eos 0.1/  IgE  919> referred to allergy >seen 06/05/23 pos ragweed/ roach rec consider allergy shots    - 07/06/2023  After extensive coaching inhaler device,  effectiveness =    80% (slt off timing)   No def evidence of asthma  flare based on exam and absence of increased need for saba or noct resp cc  but she is allergic to ragweed so I suppose pnds may be triggering cough so rec :  Depomedrol 120 mg IM  Refill phenergan dm  for prn use  No change in resp rx in meantime   Keep f/u appt for this month but must return with all meds in hand using a trust but verify approach to confirm accurate Medication  Reconciliation The principal here is that until we are certain that the  patients are doing what we've asked, it makes no sense to ask them to do more.

## 2023-07-12 ENCOUNTER — Other Ambulatory Visit: Payer: Self-pay | Admitting: Internal Medicine

## 2023-07-12 ENCOUNTER — Telehealth: Payer: Self-pay | Admitting: Internal Medicine

## 2023-07-12 NOTE — Telephone Encounter (Signed)
Marcelino Duster daughter states patient still having wheezing. Calling giving update on patient. Pharmacy is [facility] [address] Marcelino Duster phone number is [phone].

## 2023-07-12 NOTE — Telephone Encounter (Signed)
Reviewed the providers interpretation and recommendations on speaker phone with the patient and Benedict Needy the daughter of the patient. Questions invited. No questions. Patient tells me she is eating very small bites and follows this with sips of fluid at each bite. She tells me this has helped.

## 2023-07-12 NOTE — Telephone Encounter (Signed)
I called and spoke with the pt  She states that she feels much improved since recent visit 07/06/23  She is wheezing much less  She knows to use albuterol inhaler if needed  I advised for her to keep her next appt with Dr Sherene Sires for 08/01/23 and call sooner if needed

## 2023-07-12 NOTE — Telephone Encounter (Signed)
Inbound call from patient's daughter, would like to go over results from Barium Swallow test. States patient does not remember going over the results with Dr. Leonides Schanz. Please advise.

## 2023-07-19 ENCOUNTER — Other Ambulatory Visit (HOSPITAL_BASED_OUTPATIENT_CLINIC_OR_DEPARTMENT_OTHER): Payer: 59

## 2023-07-24 ENCOUNTER — Encounter: Payer: Self-pay | Admitting: Internal Medicine

## 2023-07-24 ENCOUNTER — Ambulatory Visit: Payer: 59 | Attending: Internal Medicine | Admitting: Internal Medicine

## 2023-07-24 VITALS — BP 120/74 | HR 78 | Ht 66.0 in | Wt 170.0 lb

## 2023-07-24 DIAGNOSIS — I2510 Atherosclerotic heart disease of native coronary artery without angina pectoris: Secondary | ICD-10-CM | POA: Diagnosis not present

## 2023-07-24 MED ORDER — NITROGLYCERIN 0.4 MG SL SUBL: 0.4000 mg | SUBLINGUAL_TABLET | SUBLINGUAL | 2 refills | Status: AC | PRN

## 2023-07-24 MED ORDER — TRAZODONE HCL 50 MG PO TABS: 25.0000 mg | ORAL_TABLET | Freq: Every evening | ORAL | 0 refills | Status: DC | PRN

## 2023-07-24 NOTE — Progress Notes (Signed)
Cardiology Office Note   Date:  07/24/2023   ID:  [name], DOB May 10, 1936, MRN [id number]  PCP:  Olive Bass, FNP  Cardiologist:   Dietrich Pates, MD   F/U of PAF, CAD and HTN     History of Present Illness: [name] is a 87 y.o. female with a history of HTN, DM, mod CAD (CT in 2017 with calcium score of  51  mod dz LAD; filling defect in LAA), paroxysmal atrial fibrillation  The pt was hospitalized in 2021 with dyspnea.  Found to be in CHB   She is s/p PPM implant, followed by [name]     Patient admitted 12/28/21 with NSTEMI.  She underwent  PTCA/ DES to the  LAD,probably lost her diagonal. ILVEF was 30-35%,  Hospital course was  complicated by AKI, persistent nausea, pneumonia, knee pain with aspiration and injection with cortisone. Crt 4.28 at discharge-followed by renal.  I saw the pt in Jan 2024     Since seen the pt denies CP    Breathing is stable   No dizziness     No heart racing      Current Meds  Medication Sig   acetaminophen (TYLENOL) 325 MG tablet Take 650 mg by mouth every 6 (six) hours as needed for pain.   albuterol (PROAIR HFA) 108 (90 Base) MCG/ACT inhaler 2 puffs every 4 hours as needed only  if your can't catch your breath   albuterol (PROVENTIL) (2.5 MG/3ML) 0.083% nebulizer solution Take 3 mLs (2.5 mg total) by nebulization every 6 (six) hours as needed for wheezing or shortness of breath.   Ascorbic Acid (VITAMIN C) 1000 MG tablet Take 1,000 mg by mouth daily.   atorvastatin (LIPITOR) 80 MG tablet TAKE 1 TABLET BY MOUTH EVERY DAY   BD VEO INSULIN SYRINGE U/F 31G X 15/64" 0.5 ML MISC USE AS INSTRUCTED   benzonatate (TESSALON) 200 MG capsule Take 1 capsule (200 mg total) by mouth 3 (three) times daily as needed.   Blood Glucose Monitoring Suppl (ONETOUCH VERIO FLEX SYSTEM) w/Device KIT USE AS DIRECTED   budesonide-formoterol (SYMBICORT) 160-4.5 MCG/ACT inhaler TAKE 2 PUFFS FIRST THING IN AM AND THEN ANOTHER 2 PUFFS ABOUT 12 HOURS LATER.    carvedilol (COREG) 3.125 MG tablet TAKE 1 TABLET BY MOUTH TWICE A DAY WITH A MEAL   cetirizine (ZYRTEC ALLERGY) 10 MG tablet Take 1 tablet (10 mg total) by mouth daily as needed for allergies.   cholecalciferol (VITAMIN D3) 25 MCG (1000 UNIT) tablet Take 1,000 Units by mouth daily. Isn't taking regularly   clopidogrel (PLAVIX) 75 MG tablet TAKE 1 TABLET BY MOUTH DAILY WITH BREAKFAST.   diclofenac Sodium (VOLTAREN) 1 % GEL Apply 4 g topically 4 (four) times daily as needed (pain).   ELIQUIS 2.5 MG TABS tablet TAKE 1 TABLET BY MOUTH TWICE A DAY   famotidine (PEPCID) 20 MG tablet TAKE 1 TABLET BY MOUTH TWICE A DAY   FARXIGA 5 MG TABS tablet TAKE 1 TABLET BY MOUTH EVERY DAY BEFORE BREAKFAST   fluconazole (DIFLUCAN) 100 MG tablet Take 2 tablets by mouth today, then take 1 tablet by mouth once daily for 13 days.   fluticasone (FLONASE) 50 MCG/ACT nasal spray Place 2 sprays into both nostrils daily. (Patient taking differently: Place 2 sprays into both nostrils as needed.)   furosemide (LASIX) 40 MG tablet Take 1 tablet (40 mg total) by mouth 2 (two) times daily.   glucose blood (ONETOUCH VERIO) test  strip 1 each by Other route 2 (two) times daily. And lancets 2/day   glucose blood test strip Check blood sugar twice a day   hydrALAZINE (APRESOLINE) 25 MG tablet Take 1 tablet (25 mg total) by mouth 3 (three) times daily.   insulin lispro (HUMALOG) 100 UNIT/ML injection Give 3 units with BREAKFAST, AND 18 units with SUPPER   latanoprost (XALATAN) 0.005 % ophthalmic solution Place 1 drop into both eyes at bedtime.   montelukast (SINGULAIR) 10 MG tablet TAKE 1 TABLET BY MOUTH EVERYDAY AT BEDTIME   nitroGLYCERIN (NITROSTAT) 0.4 MG SL tablet Place 1 tablet (0.4 mg total) under the tongue every 5 (five) minutes x 3 doses as needed for chest pain.   ondansetron (ZOFRAN) 8 MG tablet Take 1 tablet (8 mg total) by mouth every 8 (eight) hours as needed for nausea or vomiting.   pantoprazole (PROTONIX) 40 MG tablet  TAKE 1 TABLET BY MOUTH TWICE A DAY   potassium chloride (KLOR-CON) 10 MEQ tablet TAKE 1 TABLET BY MOUTH EVERY DAY   promethazine-dextromethorphan (PROMETHAZINE-DM) 6.25-15 MG/5ML syrup Take 5 mLs by mouth 4 (four) times daily as needed for cough.   sodium bicarbonate 650 MG tablet Take 650 mg by mouth 2 (two) times daily.   Syringe/Needle, Disp, (SYRINGE 3CC/25GX1") 25G X 1" 3 ML MISC Use to inject into the skin 2x a day   vitamin B-12 (CYANOCOBALAMIN) 100 MCG tablet Take 100 mcg by mouth daily.   Current Facility-Administered Medications for the 07/24/23 encounter (Office Visit) with Pricilla Riffle, MD  Medication   ipratropium-albuterol (DUONEB) 0.5-2.5 (3) MG/3ML nebulizer solution 3 mL        Allergies:   Aspirin and Ramipril   Past Medical History:  Diagnosis Date   Allergic rhinitis    Anterior chest wall pain    Anxiety    Asthma    Chronic diastolic CHF (congestive heart failure) (HCC)    Echo 01/2020: EF 60-65, normal wall motion, mild LVH, normal RV SF, RVSP 52.6 (moderate elevation), severe LAE, moderate RAE, trivial MR, mild MS (mean gradient 5.5 mmHg), mild aortic valve sclerosis (no AS); elevated E/e' c/w elevated LVEDP   Cough    DM type 2 (diabetes mellitus, type 2) (HCC)    GERD (gastroesophageal reflux disease)    History of diverticulitis of colon    HTN (hypertension)    Hyperlipidemia    IBS (irritable bowel syndrome)    Iron deficiency anemia    Mitral stenosis 04/17/2022   Echo 04/2022: EF 55-60, no RWMA, mild LVH, normal RVSF, normal PASP, moderate LAE, moderate mitral stenosis (mean gradient 4 mmHg), trivial MR, trivial AI, AV sclerosis without stenosis   Osteoporosis, unspecified    Renal insufficiency 07/31/2017   UTI (urinary tract infection)     Past Surgical History:  Procedure Laterality Date   CARDIOVASCULAR STRESS TEST  02/25/04   CORONARY BALLOON ANGIOPLASTY N/A 12/28/2021   Procedure: CORONARY BALLOON ANGIOPLASTY;  Surgeon: Kathleene Hazel, MD;  Location: [facility];  Service: Cardiovascular;  Laterality: N/A;   CORONARY STENT INTERVENTION N/A 12/28/2021   Procedure: CORONARY STENT INTERVENTION;  Surgeon: Kathleene Hazel, MD;  Location: [facility];  Service: Cardiovascular;  Laterality: N/A;   ESOPHAGOGASTRODUODENOSCOPY  12/26/01   PACEMAKER IMPLANT N/A 08/02/2020   Procedure: PACEMAKER IMPLANT;  Surgeon: Duke Salvia, MD;  Location: [facility] INVASIVE CV LAB;  Service: Cardiovascular;  Laterality: N/A;   RIGHT OOPHORECTOMY  jan 2010   RIGHT/LEFT HEART  CATH AND CORONARY ANGIOGRAPHY N/A 12/28/2021   Procedure: RIGHT/LEFT HEART CATH AND CORONARY ANGIOGRAPHY;  Surgeon: Kathleene Hazel, MD;  Location: [facility] CV [facility];  Service: Cardiovascular;  Laterality: N/A;     Social History:  The patient  reports that she quit smoking about 36 years ago. Her smoking use included cigarettes. She started smoking about 41 years ago. She has a 1.5 pack-year smoking history. She has never used smokeless tobacco. She reports that she does not drink alcohol and does not use drugs.   Family History:  The patient's family history includes Cancer in her sister; Diabetes in her daughter; Hypertension in her mother; Lung cancer in her brother; Melanoma in her brother; Other in her father; Stroke in her mother.    ROS:  Please see the history of present illness. All other systems are reviewed and  Negative to the above problem except as noted.    PHYSICAL EXAM: VS:  BP 120/74   Pulse 78   Ht 5\' 6"  (1.676 m)   Wt 170 lb (77.1 kg)   SpO2 98%   BMI 27.44 kg/m   GEN:  Overweight 87 yo  in no acute distress  HEENT: normal  Neck: JVP normal   Cardiac: RRR Gr I/VI systolic murmru   NO LE edema  Respiratory:  clear to auscultation GI: soft, nontender, nondistended,  No hepatomegaly  MS: no deformity Moving all extremities   Skin: warm and dry, no rash Neuro:  Strength and sensation are intact Psych: euthymic  mood, full affect   EKG:  EKG is not ordered today    Echo  July 2024     1. Left ventricular ejection fraction, by estimation, is 55 to 60%. The  left ventricle has normal function. The left ventricle has no regional  wall motion abnormalities. There is mild concentric left ventricular  hypertrophy. Left ventricular diastolic  function could not be evaluated.   2. Right ventricular systolic function is normal. The right ventricular  size is normal. There is normal pulmonary artery systolic pressure.   3. Left atrial size was severely dilated.   4. Right atrial size was severely dilated.   5. Mitral valve mean gradient 5 mmHg compared with 4 mmHg 04/2022. The  mitral valve is degenerative. No evidence of mitral valve regurgitation.  Moderate mitral stenosis. Moderate mitral annular calcification.   6. The aortic valve is tricuspid. There is mild calcification of the  aortic valve. There is mild thickening of the aortic valve. Aortic valve  regurgitation is not visualized. No aortic stenosis is present.   7. The inferior vena cava is normal in size with greater than 50%  respiratory variability, suggesting right atrial pressure of 3 mmHg.  Echo   04/17/22  ompared to echo from March 2023, LVEF is improved. . Left ventricular ejection fraction, by estimation, is 55 to 60%. The left ventricle has normal function. The left ventricle has no regional wall motion abnormalities. The left ventricular internal cavity size was mildly dilated. There is mild left ventricular hypertrophy. Left ventricular diastolic parameters are indeterminate. 1. Right ventricular systolic function is normal. The right ventricular size is normal. There is normal pulmonary artery systolic pressure. 2. 3. Left atrial size was moderately dilated. MV is thickened with restricted motion. Peak and mean gradients through the valve are 12 and 4 mm Hg MVA (VTI) is calculated at 1.12 cm2 Consistent with moderate MS.  (HR 60 bpm) Compared to previous echo mean gradient is mildly decreased (  6.5 to 4 mm Hg). Trivial mitral valve regurgitation. Moderate mitral annular calcification. 4. The aortic valve is tricuspid. Aortic valve regurgitation is trivial. Aortic valve sclerosis is present, with no evidence of aortic valve stenosis. 5. The inferior vena cava is normal in size with greater than 50% respiratory variability, suggesting right atrial pressure of 3 mmHg. R and L heart cath  12/28/21    Mid RCA lesion is 30% stenosed.   Dist RCA lesion is 50% stenosed.   1st Diag lesion is 90% stenosed.   Prox LAD to Mid LAD lesion is 95% stenosed.   A drug-eluting stent was successfully placed using a SYNERGY XD 3.0X16.   Balloon angioplasty was performed using a BALLN SAPPHIRE 2.0X12.   Post intervention, there is a 0% residual stenosis.   Post intervention, there is a 30% residual stenosis.   Severe proximal LAD stenosis involving a moderate caliber diagonal branch.  Successful PTCA/DES x 1 proximal LAD Successful balloon angioplasty Diagonal 1 No obstructive disease in the Circumflex artery Moderate non-obstructive disease in the dominant RCA Elevated right and left heart pressures.    Recommendations: Continue DAPT with ASA and Plavix for one month and can then stop ASA since she will also be on Eliquis. She remains volume overload. Will need continued diuresis with caution given renal insufficiency    Lipid Panel    Component Value Date/Time   CHOL 105 02/05/2023 0921   CHOL 135 07/28/2021 0852   TRIG 89.0 02/05/2023 0921   TRIG 83 08/07/2006 1419   HDL 47.00 02/05/2023 0921   HDL 64 07/28/2021 0852   CHOLHDL 2 02/05/2023 0921   VLDL 17.8 02/05/2023 0921   LDLCALC 41 02/05/2023 0921   LDLCALC 58 07/28/2021 0852      Wt Readings from Last 3 Encounters:  07/24/23 170 lb (77.1 kg)  07/06/23 168 lb 6.4 oz (76.4 kg)  06/15/23 172 lb 6.4 oz (78.2 kg)      ASSESSMENT AND PLAN:  1  CAD    S/P NSTEMI  with intervention to LAD in 2023   Remains on Plavix  Remains asymptomatic   2  Hx Atrial fibrillation   Permanent   Continue ELiquis Pt asymptomatic    3  PPM  Hx of CHB  Follows  with EP    4   MV dz   Moderate MS by echo July 2024   Mean gradient is 4   Follow   5  HFpEF  Volume status is OK  6    HTN  BP remains well controlled     7 HL   LDL is 41  HDL 47     8.   Renal  Follows in renal clinic        Signed, Dietrich Pates, MD  07/24/2023 10:58 AM    [facility] [address] [place], [place], [place]  [address] Phone: [phone]; Fax: [phone]

## 2023-07-24 NOTE — Patient Instructions (Signed)
Medication Instructions:   *If you need a refill on your cardiac medications before your next appointment, please call your pharmacy*   Lab Work:  If you have labs (blood work) drawn today and your tests are completely normal, you will receive your results only by: [place] (if you have MyChart) OR A paper copy in the mail If you have any lab test that is abnormal or we need to change your treatment, we will call you to review the results.   Testing/Procedures:    Follow-Up: At [facility], you and your health needs are our priority.  As part of our continuing mission to provide you with exceptional heart care, we have created designated Provider Care Teams.  These Care Teams include your primary Cardiologist (physician) and Advanced Practice Providers (APPs -  Physician Assistants and Nurse Practitioners) who all work together to provide you with the care you need, when you need it.  We recommend signing up for the patient portal called "MyChart".  Sign up information is provided on this After Visit Summary.  MyChart is used to connect with patients for Virtual Visits (Telemedicine).  Patients are able to view lab/test results, encounter notes, upcoming appointments, etc.  Non-urgent messages can be sent to your provider as well.   To learn more about what you can do with MyChart, go to [email or web address].    Your next appointment:   6 month(s)  Provider:   Dorris Carnes, MD     Other Instructions

## 2023-07-27 ENCOUNTER — Other Ambulatory Visit: Payer: Self-pay | Admitting: Internal Medicine

## 2023-07-27 NOTE — Addendum Note (Signed)
Addended by: Judieth Keens on: 07/27/2023 03:37 PM   Modules accepted: Orders

## 2023-07-27 NOTE — Progress Notes (Incomplete)
Cardiology Office Note   Date:  07/24/2023   ID:  [name], DOB [birth year], MRN [id number]  PCP:  Olive Bass, FNP  Cardiologist:   Dietrich Pates, MD   F/U of PAF, CAD and HTN     History of Present Illness: [name] is a 87 y.o. female with a history of HTN, DM, mod CAD (CT in 2017 with calcium score of  51  mod dz LAD; filling defect in LAA), paroxysmal atrial fibrillation  The pt was hospitalized in 2021 with dyspnea.  Found to be in CHB   She is s/p PPM implant, followed by [name]     Patient admitted 12/28/21 with NSTEMI.  She underwent  PTCA/ DES to the  LAD,probably lost her diagonal. ILVEF was 30-35%,  Hospital course was  complicated by AKI, persistent nausea, pneumonia, knee pain with aspiration and injection with cortisone. Crt 4.28 at discharge-followed by renal.  I saw the pt in Jan 2024     Since seen the pt denies CP    Breathing is stable   No dizziness      Current Meds  Medication Sig  . acetaminophen (TYLENOL) 325 MG tablet Take 650 mg by mouth every 6 (six) hours as needed for pain.  Marland Kitchen albuterol (PROAIR HFA) 108 (90 Base) MCG/ACT inhaler 2 puffs every 4 hours as needed only  if your can't catch your breath  . albuterol (PROVENTIL) (2.5 MG/3ML) 0.083% nebulizer solution Take 3 mLs (2.5 mg total) by nebulization every 6 (six) hours as needed for wheezing or shortness of breath.  . Ascorbic Acid (VITAMIN C) 1000 MG tablet Take 1,000 mg by mouth daily.  Marland Kitchen atorvastatin (LIPITOR) 80 MG tablet TAKE 1 TABLET BY MOUTH EVERY DAY  . BD VEO INSULIN SYRINGE U/F 31G X 15/64" 0.5 ML MISC USE AS INSTRUCTED  . benzonatate (TESSALON) 200 MG capsule Take 1 capsule (200 mg total) by mouth 3 (three) times daily as needed.  . Blood Glucose Monitoring Suppl (ONETOUCH VERIO FLEX SYSTEM) w/Device KIT USE AS DIRECTED  . budesonide-formoterol (SYMBICORT) 160-4.5 MCG/ACT inhaler TAKE 2 PUFFS FIRST THING IN AM AND THEN ANOTHER 2 PUFFS ABOUT 12 HOURS LATER.  . carvedilol  (COREG) 3.125 MG tablet TAKE 1 TABLET BY MOUTH TWICE A DAY WITH A MEAL  . cetirizine (ZYRTEC ALLERGY) 10 MG tablet Take 1 tablet (10 mg total) by mouth daily as needed for allergies.  . cholecalciferol (VITAMIN D3) 25 MCG (1000 UNIT) tablet Take 1,000 Units by mouth daily. Isn't taking regularly  . clopidogrel (PLAVIX) 75 MG tablet TAKE 1 TABLET BY MOUTH DAILY WITH BREAKFAST.  Marland Kitchen diclofenac Sodium (VOLTAREN) 1 % GEL Apply 4 g topically 4 (four) times daily as needed (pain).  Marland Kitchen ELIQUIS 2.5 MG TABS tablet TAKE 1 TABLET BY MOUTH TWICE A DAY  . famotidine (PEPCID) 20 MG tablet TAKE 1 TABLET BY MOUTH TWICE A DAY  . FARXIGA 5 MG TABS tablet TAKE 1 TABLET BY MOUTH EVERY DAY BEFORE BREAKFAST  . fluconazole (DIFLUCAN) 100 MG tablet Take 2 tablets by mouth today, then take 1 tablet by mouth once daily for 13 days.  . fluticasone (FLONASE) 50 MCG/ACT nasal spray Place 2 sprays into both nostrils daily. (Patient taking differently: Place 2 sprays into both nostrils as needed.)  . furosemide (LASIX) 40 MG tablet Take 1 tablet (40 mg total) by mouth 2 (two) times daily.  Marland Kitchen glucose blood (ONETOUCH VERIO) test strip 1 each by Other route 2 (  two) times daily. And lancets 2/day  . glucose blood test strip Check blood sugar twice a day  . hydrALAZINE (APRESOLINE) 25 MG tablet Take 1 tablet (25 mg total) by mouth 3 (three) times daily.  . insulin lispro (HUMALOG) 100 UNIT/ML injection Give 3 units with BREAKFAST, AND 18 units with SUPPER  . latanoprost (XALATAN) 0.005 % ophthalmic solution Place 1 drop into both eyes at bedtime.  . montelukast (SINGULAIR) 10 MG tablet TAKE 1 TABLET BY MOUTH EVERYDAY AT BEDTIME  . nitroGLYCERIN (NITROSTAT) 0.4 MG SL tablet Place 1 tablet (0.4 mg total) under the tongue every 5 (five) minutes x 3 doses as needed for chest pain.  Marland Kitchen ondansetron (ZOFRAN) 8 MG tablet Take 1 tablet (8 mg total) by mouth every 8 (eight) hours as needed for nausea or vomiting.  . pantoprazole (PROTONIX) 40 MG  tablet TAKE 1 TABLET BY MOUTH TWICE A DAY  . potassium chloride (KLOR-CON) 10 MEQ tablet TAKE 1 TABLET BY MOUTH EVERY DAY  . promethazine-dextromethorphan (PROMETHAZINE-DM) 6.25-15 MG/5ML syrup Take 5 mLs by mouth 4 (four) times daily as needed for cough.  . sodium bicarbonate 650 MG tablet Take 650 mg by mouth 2 (two) times daily.  . Syringe/Needle, Disp, (SYRINGE 3CC/25GX1") 25G X 1" 3 ML MISC Use to inject into the skin 2x a day  . vitamin B-12 (CYANOCOBALAMIN) 100 MCG tablet Take 100 mcg by mouth daily.   Current Facility-Administered Medications for the 07/24/23 encounter (Office Visit) with Pricilla Riffle, MD  Medication  . ipratropium-albuterol (DUONEB) 0.5-2.5 (3) MG/3ML nebulizer solution 3 mL        Allergies:   Aspirin and Ramipril   Past Medical History:  Diagnosis Date  . Allergic rhinitis   . Anterior chest wall pain   . Anxiety   . Asthma   . Chronic diastolic CHF (congestive heart failure) (HCC)    Echo 01/2020: EF 60-65, normal wall motion, mild LVH, normal RV SF, RVSP 52.6 (moderate elevation), severe LAE, moderate RAE, trivial MR, mild MS (mean gradient 5.5 mmHg), mild aortic valve sclerosis (no AS); elevated E/e' c/w elevated LVEDP  . Cough   . DM type 2 (diabetes mellitus, type 2) (HCC)   . GERD (gastroesophageal reflux disease)   . History of diverticulitis of colon   . HTN (hypertension)   . Hyperlipidemia   . IBS (irritable bowel syndrome)   . Iron deficiency anemia   . Mitral stenosis 04/17/2022   Echo 04/2022: EF 55-60, no RWMA, mild LVH, normal RVSF, normal PASP, moderate LAE, moderate mitral stenosis (mean gradient 4 mmHg), trivial MR, trivial AI, AV sclerosis without stenosis  . Osteoporosis, unspecified   . Renal insufficiency 07/31/2017  . UTI (urinary tract infection)     Past Surgical History:  Procedure Laterality Date  . CARDIOVASCULAR STRESS TEST  02/25/04  . CORONARY BALLOON ANGIOPLASTY N/A 12/28/2021   Procedure: CORONARY BALLOON  ANGIOPLASTY;  Surgeon: Kathleene Hazel, MD;  Location: [facility] INVASIVE CV LAB;  Service: Cardiovascular;  Laterality: N/A;  . CORONARY STENT INTERVENTION N/A 12/28/2021   Procedure: CORONARY STENT INTERVENTION;  Surgeon: Kathleene Hazel, MD;  Location: [facility] INVASIVE CV LAB;  Service: Cardiovascular;  Laterality: N/A;  . ESOPHAGOGASTRODUODENOSCOPY  12/26/01  . PACEMAKER IMPLANT N/A 08/02/2020   Procedure: PACEMAKER IMPLANT;  Surgeon: Duke Salvia, MD;  Location[facility] INVASIVE CV LAB;  Service: Cardiovascular;  Laterality: N/A;  . RIGHT OOPHORECTOMY  jan 2010  . RIGHT/LEFT HEART CATH AND CORONARY ANGIOGRAPHY N/A 12/28/2021  Procedure: RIGHT/LEFT HEART CATH AND CORONARY ANGIOGRAPHY;  Surgeon: Kathleene Hazel, MD;  Location: [facility] INVASIVE CV LAB;  Service: Cardiovascular;  Laterality: N/A;     Social History:  The patient  reports that she quit smoking about 36 years ago. Her smoking use included cigarettes. She started smoking about 41 years ago. She has a 1.5 pack-year smoking history. She has never used smokeless tobacco. She reports that she does not drink alcohol and does not use drugs.   Family History:  The patient's family history includes Cancer in her sister; Diabetes in her daughter; Hypertension in her mother; Lung cancer in her brother; Melanoma in her brother; Other in her father; Stroke in her mother.    ROS:  Please see the history of present illness. All other systems are reviewed and  Negative to the above problem except as noted.    PHYSICAL EXAM: VS:  BP 120/74   Pulse 78   Ht 5\' 6"  (1.676 m)   Wt 170 lb (77.1 kg)   SpO2 98%   BMI 27.44 kg/m   GEN:  Overweight 87 yo  in no acute distress  HEENT: normal  Neck: JVP normal   Cardiac: RRR Gr I/VI systolic murmru   NO LE edema  Respiratory:  clear to auscultation GI: soft, nontender, nondistended,  No hepatomegaly  MS: no deformity Moving all extremities   Skin: warm and dry, no rash Neuro:  Strength and  sensation are intact Psych: euthymic mood, full affect   EKG:  EKG is not ordered today    Echo  July 2024     1. Left ventricular ejection fraction, by estimation, is 55 to 60%. The  left ventricle has normal function. The left ventricle has no regional  wall motion abnormalities. There is mild concentric left ventricular  hypertrophy. Left ventricular diastolic  function could not be evaluated.   2. Right ventricular systolic function is normal. The right ventricular  size is normal. There is normal pulmonary artery systolic pressure.   3. Left atrial size was severely dilated.   4. Right atrial size was severely dilated.   5. Mitral valve mean gradient 5 mmHg compared with 4 mmHg 04/2022. The  mitral valve is degenerative. No evidence of mitral valve regurgitation.  Moderate mitral stenosis. Moderate mitral annular calcification.   6. The aortic valve is tricuspid. There is mild calcification of the  aortic valve. There is mild thickening of the aortic valve. Aortic valve  regurgitation is not visualized. No aortic stenosis is present.   7. The inferior vena cava is normal in size with greater than 50%  respiratory variability, suggesting right atrial pressure of 3 mmHg.  Echo   04/17/22  ompared to echo from March 2023, LVEF is improved. . Left ventricular ejection fraction, by estimation, is 55 to 60%. The left ventricle has normal function. The left ventricle has no regional wall motion abnormalities. The left ventricular internal cavity size was mildly dilated. There is mild left ventricular hypertrophy. Left ventricular diastolic parameters are indeterminate. 1. Right ventricular systolic function is normal. The right ventricular size is normal. There is normal pulmonary artery systolic pressure. 2. 3. Left atrial size was moderately dilated. MV is thickened with restricted motion. Peak and mean gradients through the valve are 12 and 4 mm Hg MVA (VTI) is calculated at  1.12 cm2 Consistent with moderate MS. (HR 60 bpm) Compared to previous echo mean gradient is mildly decreased (6.5 to 4 mm Hg). Trivial mitral valve  regurgitation. Moderate mitral annular calcification. 4. The aortic valve is tricuspid. Aortic valve regurgitation is trivial. Aortic valve sclerosis is present, with no evidence of aortic valve stenosis. 5. The inferior vena cava is normal in size with greater than 50% respiratory variability, suggesting right atrial pressure of 3 mmHg. R and L heart cath  12/28/21  .  Mid RCA lesion is 30% stenosed. .  Dist RCA lesion is 50% stenosed. .  1st Diag lesion is 90% stenosed. .  Prox LAD to Mid LAD lesion is 95% stenosed. .  A drug-eluting stent was successfully placed using a SYNERGY XD 3.0X16. .  Balloon angioplasty was performed using a BALLN SAPPHIRE 2.0X12. Marland Kitchen  Post intervention, there is a 0% residual stenosis. Marland Kitchen  Post intervention, there is a 30% residual stenosis.   Severe proximal LAD stenosis involving a moderate caliber diagonal branch.  Successful PTCA/DES x 1 proximal LAD Successful balloon angioplasty Diagonal 1 No obstructive disease in the Circumflex artery Moderate non-obstructive disease in the dominant RCA Elevated right and left heart pressures.    Recommendations: Continue DAPT with ASA and Plavix for one month and can then stop ASA since she will also be on Eliquis. She remains volume overload. Will need continued diuresis with caution given renal insufficiency    Lipid Panel    Component Value Date/Time   CHOL 105 02/05/2023 0921   CHOL 135 07/28/2021 0852   TRIG 89.0 02/05/2023 0921   TRIG 83 08/07/2006 1419   HDL 47.00 02/05/2023 0921   HDL 64 07/28/2021 0852   CHOLHDL 2 02/05/2023 0921   VLDL 17.8 02/05/2023 0921   LDLCALC 41 02/05/2023 0921   LDLCALC 58 07/28/2021 0852      Wt Readings from Last 3 Encounters:  07/24/23 170 lb (77.1 kg)  07/06/23 168 lb 6.4 oz (76.4 kg)  06/15/23 172 lb 6.4 oz (78.2  kg)      ASSESSMENT AND PLAN:  1  CAD   S/P NSTEMI  with intervention to LAD in 2023   Remains on Plavix  No symptoms of angina    2  Hx Atrial fibrillation   Permanent   Continue ELiquis Pt asymptomatic    3  PPM  Hx of CHB  Follows  with EP    3  MV dz   Moderate MS by echo   Follow   Clincially No SOB     Will probably get echo after next visit    4  HTN  BP is well controlled     5  HL   DL 56  HDL 55    6.   Renal  Cr 2.6 in Nov 2023   Seen in renal clinc   7  Diet   Discussed diet    Cut out sugars, cut down on carbs    Increase fiber    More protein       Signed, Dietrich Pates, MD  07/24/2023 10:58 AM    [facility] [address] [place], [place], [place]  [address] Phone: [phone]; Fax: [phone]

## 2023-07-30 ENCOUNTER — Telehealth: Payer: Self-pay | Admitting: Internal Medicine

## 2023-07-30 ENCOUNTER — Ambulatory Visit (INDEPENDENT_AMBULATORY_CARE_PROVIDER_SITE_OTHER): Payer: 59

## 2023-07-30 DIAGNOSIS — I442 Atrioventricular block, complete: Secondary | ICD-10-CM

## 2023-07-30 MED ORDER — HYDRALAZINE HCL 25 MG PO TABS: 75.0000 mg | ORAL_TABLET | Freq: Three times a day (TID) | ORAL | 3 refills | Status: DC

## 2023-07-30 NOTE — Telephone Encounter (Signed)
Called pt and pt's pharmacy and sent to the pharmacy the correct pt's medication directions for Hydralazine. Pt's daughter stated that pt's medication hydralazine 25 mg tablet was changed to pt taking 1 tablet TID. This was sent in wrong. I did not see where Dr. Tenny Craw changed this medication. Medication was sent to pt's pharmacy correctly as pt taking Hydralazine 25 mg tablets, 3 tablets by mouth three times daily as prescribed by Dr. Tenny Craw. Confirmation received. FYI

## 2023-07-30 NOTE — Telephone Encounter (Signed)
Pt c/o medication issue:  1. Name of Medication:   hydrALAZINE (APRESOLINE) 25 MG tablet   2. How are you currently taking this medication (dosage and times per day)?   3. Are you having a reaction (difficulty breathing--STAT)?   4. What is your medication issue?   Daughter wants a call back to confirm the dosage for patient's medication.  Daughter stated patient is now out of this medication and will need a refill sent to [id number] - [place], [place] - [address]

## 2023-07-31 ENCOUNTER — Telehealth (HOSPITAL_BASED_OUTPATIENT_CLINIC_OR_DEPARTMENT_OTHER): Payer: Self-pay

## 2023-08-01 ENCOUNTER — Ambulatory Visit: Payer: 59

## 2023-08-01 ENCOUNTER — Ambulatory Visit (INDEPENDENT_AMBULATORY_CARE_PROVIDER_SITE_OTHER): Payer: 59 | Admitting: Internal Medicine

## 2023-08-01 ENCOUNTER — Encounter: Payer: Self-pay | Admitting: Internal Medicine

## 2023-08-01 VITALS — BP 134/68 | HR 78 | Temp 98.2°F | Ht 66.0 in | Wt 173.8 lb

## 2023-08-01 DIAGNOSIS — J45991 Cough variant asthma: Secondary | ICD-10-CM

## 2023-08-01 DIAGNOSIS — R0609 Other forms of dyspnea: Secondary | ICD-10-CM | POA: Diagnosis not present

## 2023-08-01 LAB — CBC WITH DIFFERENTIAL/PLATELET
Basophils Absolute: 0.1 10*3/uL (ref 0.0–0.1)
Basophils Relative: 1 % (ref 0.0–3.0)
Eosinophils Absolute: 0.1 10*3/uL (ref 0.0–0.7)
Eosinophils Relative: 1.4 % (ref 0.0–5.0)
HCT: 42.9 % (ref 36.0–46.0)
Hemoglobin: 14 g/dL (ref 12.0–15.0)
Lymphocytes Relative: 14.6 % (ref 12.0–46.0)
Lymphs Abs: 1 10*3/uL (ref 0.7–4.0)
MCHC: 32.6 g/dL (ref 30.0–36.0)
MCV: 86.9 fL (ref 78.0–100.0)
Monocytes Absolute: 0.7 10*3/uL (ref 0.1–1.0)
Monocytes Relative: 9.8 % (ref 3.0–12.0)
Neutro Abs: 5.1 10*3/uL (ref 1.4–7.7)
Neutrophils Relative %: 73.2 % (ref 43.0–77.0)
Platelets: 130 10*3/uL — ABNORMAL LOW (ref 150.0–400.0)
RBC: 4.94 Mil/uL (ref 3.87–5.11)
RDW: 17.4 % — ABNORMAL HIGH (ref 11.5–15.5)
WBC: 7 10*3/uL (ref 4.0–10.5)

## 2023-08-01 LAB — CUP PACEART REMOTE DEVICE CHECK
Battery Remaining Longevity: 127 mo
Battery Voltage: 3.02 V
Brady Statistic RV Percent Paced: 99.85 %
Date Time Interrogation Session: 20241028044428
Implantable Lead Connection Status: 753985
Implantable Lead Implant Date: 20211101
Implantable Lead Location: 753860
Implantable Lead Model: 3830
Implantable Pulse Generator Implant Date: 20211101
Lead Channel Impedance Value: 361 Ohm
Lead Channel Impedance Value: 456 Ohm
Lead Channel Pacing Threshold Amplitude: 0.75 V
Lead Channel Pacing Threshold Pulse Width: 0.4 ms
Lead Channel Sensing Intrinsic Amplitude: 15.5 mV
Lead Channel Sensing Intrinsic Amplitude: 15.5 mV
Lead Channel Setting Pacing Amplitude: 2 V
Lead Channel Setting Pacing Pulse Width: 0.4 ms
Lead Channel Setting Sensing Sensitivity: 0.9 mV
Zone Setting Status: 755011

## 2023-08-01 LAB — BASIC METABOLIC PANEL
BUN: 49 mg/dL — ABNORMAL HIGH (ref 6–23)
CO2: 25 meq/L (ref 19–32)
Calcium: 9.8 mg/dL (ref 8.4–10.5)
Chloride: 101 meq/L (ref 96–112)
Creatinine, Ser: 2.39 mg/dL — ABNORMAL HIGH (ref 0.40–1.20)
GFR: 18.09 mL/min — ABNORMAL LOW (ref >60.00)
Glucose, Bld: 240 mg/dL — ABNORMAL HIGH (ref 70–99)
Potassium: 4.6 meq/L (ref 3.5–5.1)
Sodium: 140 meq/L (ref 135–145)

## 2023-08-01 LAB — BRAIN NATRIURETIC PEPTIDE: Pro B Natriuretic peptide (BNP): 239 pg/mL — ABNORMAL HIGH (ref 0.0–100.0)

## 2023-08-01 LAB — POCT EXHALED NITRIC OXIDE: FeNO level (ppb): 20

## 2023-08-01 NOTE — Assessment & Plan Note (Addendum)
PFT's 03/18/08 minimal airflow obstruction  -PFT's January 04, 2010 No sign airflow obstruction   - FENO 01/25/2017  =   23 p am symb 160 x 2 pffs - Spirometry 01/25/2017  wnl x for min curvature on f/v p am symb 160 x 2 / no saba prior - 07/30/2017   After extensive coaching HFA effectiveness =    75% from baseline 50%  - Allergy profile 07/30/17  >  Eos 0.2 /  IgE  145  RAST pos mold  - FENO 01/28/2018  =   17 - Spirometry 01/28/2018  FEV1 1.22 (65%)  Ratio 79  - Allergy screen 05/16/23 >  Eos 0.1/  IgE  919> referred to allergy >seen 06/05/23 pos ragweed/ roach rec consider allergy shots    - 08/01/2023  After extensive coaching inhaler device,  effectiveness =    90% with hfa - FENO  08/01/2023 = 20 on symb 160/singulair   All goals of chronic asthma control met including optimal function and elimination of symptoms with minimal true need for rescue therapy.  Contingencies discussed in full including contacting this office immediately if not controlling the symptoms using the rule of two's.    Approp use of saba discussed under doe a/p  F/u 6 weeks, sooner prn  Each maintenance medication was reviewed in detail including emphasizing most importantly the difference between maintenance and prns and under what circumstances the prns are to be triggered using an action plan format where appropriate.  Total time for H and P, chart review, counseling, reviewing hfa device(s) , directly observing portions of ambulatory 02 saturation study/ and generating customized AVS unique to this office visit / same day charting = 42 min

## 2023-08-01 NOTE — Progress Notes (Signed)
Patient ID: [name], female   DOB: [birth year] .   MRN: [id number]  Brief patient profile:  87 yobf quit smoking 1988 with h/o asthma  And nl baseline pfts ( as of 01/04/2010) who had been using Advair on a p.r.n. basis and noticing increasing dyspnea and need for rescue therapy x one mo when seen 01/06/08 for pulmonary evaluation so Advair stopped. Began Symbicort 160/4.31mcg 2 puffs two times a day.  Returned 02/13/08 improved with decreased dyspnea and no rescue use   Returned 03/18/08 symptom free no rescue needed, stopped reglan, no flare   05/04/2020  f/u ov/[name] re: asthma/ rhinitis not sure about ppi/ using benadryl helps some  Chief Complaint  Patient presents with   Follow-up    Increased SOB and chest tightness over the past month. She has some cough- prod with min light yellow sputum. She is using her albuterol inhaler about 2 x per day.   Dyspnea:  MMRC2 = can't walk a nl pace on a flat grade s sob but does fine slow and flat  Cough: more cough assoc with pnds day > noct/ not taking omeprazole  Sleeping: 2 pillows ok  SABA use: 2x daily  02: none  rec Plan A = Automatic = Always=    Symbicort 160 Take 2 puffs first thing in am and then another 2 puffs about 12 hours later.  Work on inhaler technique:    Omerprazole 40 mg Take 30-60 min before first meal of the day  Prednisone 10 mg take  4 each am x 2 days,   2 each am x 2 days,  1 each am x 2 days and stop  Plan B = Backup (to supplement plan A, not to replace it) Only use your albuterol inhaler as a rescue medication         07/04/2021  f/u ov/[name] re: cough variant asthma   maint on symbicort 160 2bid/ singulair  - no longer on gerd rx  Chief Complaint  Patient presents with   Follow-up    Still coughing some   Dyspnea:  baseline still MMRC2 = can't walk a nl pace on a flat grade s sob but does fine slow and flat eg Mall walking  Cough: worse x one week, slt yellow not on gerd rx  Sleeping: flat bed/ 2 pillows- no  noct symptoms SABA use: started up using it one week prior to OV  twice daily  02: none  Covid status:   2 vax / never got covid  Rec At onset cough the 1st thing you should do :  Try prilosec otc 20mg   Take 30-60 min before first meal of the day and Pepcid ac (famotidine) 20 mg one @  bedtime until cough is completely gone   Prednisone 10 mg take  4 each am x 2 days,   2 each am x 2 days,  1 each am x 2 days and stop  Zpak   12/28/21 LHC/RHC   Mid RCA lesion is 30% stenosed.   Dist RCA lesion is 50% stenosed.   1st Diag lesion is 90% stenosed.   Prox LAD to Mid LAD lesion is 95% stenosed.   A drug-eluting stent was successfully placed using a SYNERGY XD 3.0X16.   Balloon angioplasty was performed using a BALLN SAPPHIRE 2.0X12.   Post intervention, there is a 0% residual stenosis.   Post intervention, there is a 30% residual stenosis.   Severe proximal LAD stenosis involving  a moderate caliber diagonal branch.  Successful PTCA/DES x 1 proximal LAD Successful balloon angioplasty Diagonal 1 No obstructive disease in the Circumflex artery Moderate non-obstructive disease in the dominant RCA Elevated right and left heart pressures. (Wedge 27)      Onset around middle July 2024 bad cough rxvJuly 19 zpak then prednisone    05/16/2023  f/u ov/[name] re: bad cough since mid July 2024 assoc  recurrent chest tightness maint on symbicort 160 Take 2 puffs first thing in am and then another 2 puffs about 12 hours later.  Chief Complaint  Patient presents with   Follow-up    C/o some chest tightness.   Dyspnea: chest tightness x weeks at rest, less noticeable with walking and ? Some better p neb  - does not correlate with nausea which started p doxy rx  Cough: dry cough Lynett Grimes her up most nights ? Using mucinex dm/ flutter valve as rec/ not using flutter   Sleeping: flat bed 2 pillows  SABA use: not using hfa at all  / neb 2-3 x daily  02: none  Rec Plan A = Automatic = Always=    Symbicort  160 Take 2 puffs first thing in am and then another 2 puffs about 12 hours later.  Work on inhaler technique:  Plan B = Backup (to supplement plan A, not to replace it) Only use your albuterol inhaler as a rescue medication Plan C = Crisis (instead of Plan B but only if Plan B stops working) - only use your albuterol nebulizer if you first try Plan B  Continue pantoprazole 40 mg Take 30- 60 min before your first and last meals of the day and pepcid 20 mg one at bedtime  GERD diet reviewed, bed blocks   For cough/ congestion > mucinex or mucinex dm  up to maximum of  1200 mg every 12 hours and use the flutter valve as much as you can   Depomedrol 120 mg IM    07/06/2023  f/u ov/[name] re: asthma maint on symbicort 160 did not  bring meds  Chief Complaint  Patient presents with   Follow-up    Chest feels tight today  Dyspnea:  75 ft  Cough: dry  sporadic worse with walking  Sleeping: flat bed 2 -3 pillows s resp cc  SABA use: last used hfa > 12 h prior to OV  / neb not using either 02: none   Rec Depomedrol 120 mg IM today  For cough > phenergan dm up to 1 -2 tsp every  4 hours as meds   Please keep your follow up appt and  call sooner if needed with all medications /inhalers/ solutions in hand     08/01/2023  f/u ov/[name] re: asthma with elevated edp and esophagitis on CT chest 04/2023    maint on symbicort 160 x 2 puffs did  bring meds Chief Complaint  Patient presents with   Follow-up    Some improvement with cough, but still present.  Itchy throat persistent.  Dyspnea:  fatigue stops her 1st  Cough: tickle  Sleeping: flat bed 2 pillows s  resp cc  SABA use: once or twice daily  02: none      No obvious day to day or daytime variability or assoc excess/ purulent sputum or mucus plugs or hemoptysis or cp or chest tightness, subjective wheeze or overt sinus or hb symptoms.    Also denies any obvious fluctuation of symptoms with weather or environmental changes  or other  aggravating or alleviating factors except as outlined above   No unusual exposure hx or h/o childhood pna/ asthma or knowledge of premature birth.  Current Allergies, Complete Past Medical History, Past Surgical History, Family History, and Social History were reviewed in [employer] record.  ROS  The following are not active complaints unless bolded Hoarseness, sore throat, dysphagia, dental problems, itching, sneezing,  nasal congestion or discharge of excess mucus or purulent secretions, ear ache,   fever, chills, sweats, unintended wt loss or wt gain, classically pleuritic or exertional cp,  orthopnea pnd or arm/hand swelling  or leg swelling, presyncope, palpitations, abdominal pain, chronic , anorexia, nausea, vomiting, diarrhea  or change in bowel habits or change in bladder habits, change in stools or change in urine, dysuria, hematuria,  rash, arthralgias, visual complaints, headache, numbness, weakness or ataxia or problems with walking or coordination,  change in mood or  memory.        Current Meds  Medication Sig   acetaminophen (TYLENOL) 325 MG tablet Take 650 mg by mouth every 6 (six) hours as needed for pain.   albuterol (PROAIR HFA) 108 (90 Base) MCG/ACT inhaler 2 puffs every 4 hours as needed only  if your can't catch your breath   albuterol (PROVENTIL) (2.5 MG/3ML) 0.083% nebulizer solution Take 3 mLs (2.5 mg total) by nebulization every 6 (six) hours as needed for wheezing or shortness of breath.   Ascorbic Acid (VITAMIN C) 1000 MG tablet Take 1,000 mg by mouth daily.   atorvastatin (LIPITOR) 80 MG tablet TAKE 1 TABLET BY MOUTH EVERY DAY   BD VEO INSULIN SYRINGE U/F 31G X 15/64" 0.5 ML MISC USE AS INSTRUCTED   benzonatate (TESSALON) 200 MG capsule Take 1 capsule (200 mg total) by mouth 3 (three) times daily as needed.   Blood Glucose Monitoring Suppl (ONETOUCH VERIO FLEX SYSTEM) w/Device KIT USE AS DIRECTED   budesonide-formoterol (SYMBICORT) 160-4.5  MCG/ACT inhaler TAKE 2 PUFFS FIRST THING IN AM AND THEN ANOTHER 2 PUFFS ABOUT 12 HOURS LATER.   carvedilol (COREG) 3.125 MG tablet TAKE 1 TABLET BY MOUTH TWICE A DAY WITH A MEAL   cetirizine (ZYRTEC ALLERGY) 10 MG tablet Take 1 tablet (10 mg total) by mouth daily as needed for allergies.   cholecalciferol (VITAMIN D3) 25 MCG (1000 UNIT) tablet Take 1,000 Units by mouth daily. Isn't taking regularly   clopidogrel (PLAVIX) 75 MG tablet TAKE 1 TABLET BY MOUTH DAILY WITH BREAKFAST.   diclofenac Sodium (VOLTAREN) 1 % GEL Apply 4 g topically 4 (four) times daily as needed (pain).   ELIQUIS 2.5 MG TABS tablet TAKE 1 TABLET BY MOUTH TWICE A DAY   famotidine (PEPCID) 20 MG tablet TAKE 1 TABLET BY MOUTH TWICE A DAY   FARXIGA 5 MG TABS tablet TAKE 1 TABLET BY MOUTH EVERY DAY BEFORE BREAKFAST   fluconazole (DIFLUCAN) 100 MG tablet Take 2 tablets by mouth today, then take 1 tablet by mouth once daily for 13 days.   fluticasone (FLONASE) 50 MCG/ACT nasal spray Place 2 sprays into both nostrils daily. (Patient taking differently: Place 2 sprays into both nostrils as needed.)   furosemide (LASIX) 40 MG tablet Take 1 tablet (40 mg total) by mouth 2 (two) times daily.   glucose blood (ONETOUCH VERIO) test strip 1 each by Other route 2 (two) times daily. And lancets 2/day   glucose blood test strip Check blood sugar twice a day   hydrALAZINE (APRESOLINE) 25 MG tablet Take 3  tablets (75 mg total) by mouth 3 (three) times daily.   insulin lispro (HUMALOG) 100 UNIT/ML injection Give 3 units with BREAKFAST, AND 18 units with SUPPER   latanoprost (XALATAN) 0.005 % ophthalmic solution Place 1 drop into both eyes at bedtime.   montelukast (SINGULAIR) 10 MG tablet TAKE 1 TABLET BY MOUTH EVERYDAY AT BEDTIME   nitroGLYCERIN (NITROSTAT) 0.4 MG SL tablet Place 1 tablet (0.4 mg total) under the tongue every 5 (five) minutes x 3 doses as needed for chest pain.   ondansetron (ZOFRAN) 8 MG tablet Take 1 tablet (8 mg total) by mouth  every 8 (eight) hours as needed for nausea or vomiting.   pantoprazole (PROTONIX) 40 MG tablet TAKE 1 TABLET BY MOUTH TWICE A DAY   potassium chloride (KLOR-CON) 10 MEQ tablet TAKE 1 TABLET BY MOUTH EVERY DAY   promethazine-dextromethorphan (PROMETHAZINE-DM) 6.25-15 MG/5ML syrup Take 5 mLs by mouth 4 (four) times daily as needed for cough.   sodium bicarbonate 650 MG tablet Take 650 mg by mouth 2 (two) times daily.   Syringe/Needle, Disp, (SYRINGE 3CC/25GX1") 25G X 1" 3 ML MISC Use to inject into the skin 2x a day   traZODone (DESYREL) 50 MG tablet Take 0.5 tablets (25 mg total) by mouth at bedtime as needed for sleep.   vitamin B-12 (CYANOCOBALAMIN) 100 MCG tablet Take 100 mcg by mouth daily.   Current Facility-Administered Medications for the 08/01/23 encounter (Office Visit) with Nyoka Cowden, MD  Medication   ipratropium-albuterol (DUONEB) 0.5-2.5 (3) MG/3ML nebulizer solution 3 mL            Past Medical History:   OSTEOPOROSIS (ICD-733.00)  HYPERTENSION (ICD-401.9)  HYPERLIPIDEMIA (ICD-272.4)  DIVERTICULITIS, HX OF (ICD-V12.79)  DIABETES MELLITUS, TYPE II (ICD-250.00)  COLON CANCER, HX OF (ICD-V10.05)  ASTHMA (ICD-493.90)  -PFT's 03/18/08 minimal airflow obstruction  -PFT's January 04, 2010 No sign airflow obstruction  -Mastered HFA technique August 13, 2008 > confimed November 23, 2009  ANXIETY (ICD-300.00)  ANEMIA-IRON DEFICIENCY (ICD-280.9)  ALLERGIC RHINITIS (ICD-477.9)  IBS  GERD            Objective:   Physical Exam  Wts   08/01/2023   173  07/06/2023    168   05/16/2023    168  07/31/2022  173  01/25/2022    167   07/04/2021    182 11/04/2020      181 05/04/2020      185 05/05/2019      196  wt 202 November 23, 2009> 199 January 04, 2010 > 209  03/22/11 > 214 10/16/2013 >  01/25/2017   193> 01/28/2018   200     Vital signs reviewed  08/01/2023  - Note at rest 02 sats  99% on RA   General appearance:    amb somber bf nad   HEENT : Oropharynx  clear         NECK :  without  apparent JVD/ palpable Nodes/TM    LUNGS: no acc muscle use,  Nl contour chest which is clear to A and P bilaterally without cough on insp or exp maneuvers   CV:  RRR  no s3 or murmur or increase in P2, and no edema   ABD:  soft and nontender    MS:  Nl gait/ ext warm without deformities Or obvious joint restrictions  calf tenderness, cyanosis or clubbing    SKIN: warm and dry without lesions    NEURO:  alert, approp, nl sensorium with  no motor or cerebellar deficits apparent.     CXR PA and Lateral:   08/01/2023 :    I personally reviewed images and impression is as follows:      Cardiomegaly s overt chf   Labs ordered/ reviewed:      Chemistry      Component Value Date/Time   NA 140 08/01/2023 0957   NA 141 08/23/2022 0000   K 4.6 08/01/2023 0957   CL 101 08/01/2023 0957   CO2 25 08/01/2023 0957   BUN 49 (H) 08/01/2023 0957   BUN 46 (A) 08/23/2022 0000   CREATININE 2.39 (H) 08/01/2023 0957   CREATININE 1.74 (H) 08/28/2016 1042   GLU 129 08/23/2022 0000      Component Value Date/Time   CALCIUM 9.8 08/01/2023 0957   CALCIUM 10.2 07/06/2011 1144   ALKPHOS 102 05/01/2023 1141   AST 21 05/01/2023 1141   ALT 35 05/01/2023 1141   BILITOT 0.8 05/01/2023 1141     Lab Results  Component Value Date   CREATININE 2.39 (H) 08/01/2023   CREATININE 2.82 (H) 05/16/2023   CREATININE 2.74 (H) 05/01/2023       Lab Results  Component Value Date   WBC 7.0 08/01/2023   HGB 14.0 08/01/2023   HCT 42.9 08/01/2023   MCV 86.9 08/01/2023   PLT 130.0 (L) 08/01/2023     Lab Results  Component Value Date   DDIMER 0.40 05/16/2023      Lab Results  Component Value Date   TSH 3.00 05/16/2023     Lab Results  Component Value Date   PROBNP 239.0 (H) 08/01/2023       Lab Results  Component Value Date   ESRSEDRATE 1 05/16/2023   ESRSEDRATE 1 01/12/2021   ESRSEDRATE 3 12/22/2009                         Assessment:

## 2023-08-01 NOTE — Patient Instructions (Addendum)
Pantoprazole should be Take 30- 60 min before your first and last meals of the day   Pepcid (famotidine) after supper and at bedtime  For drainage / throat tickle try take CHLORPHENIRAMINE  4 mg  ("Allergy Relief" 4mg   at [facility] should be easiest to find in the blue or green box usually on bottom shelf)  take one every 4 hours as needed - extremely effective and inexpensive over the counter- may cause drowsiness so start with just a dose or two an hour before bedtime and see how you tolerate it before trying in daytime.   GERD (REFLUX)  is an extremely common cause of respiratory symptoms just like yours , many times with no obvious heartburn at all.    It can be treated with medication, but also with lifestyle changes including elevation of the head of your bed (ideally with 6 -8inch blocks under the headboard of your bed),  Smoking cessation, avoidance of late meals, excessive alcohol, and avoid fatty foods, chocolate, peppermint, colas, red wine, and acidic juices such as orange juice.  NO MINT OR MENTHOL PRODUCTS SO NO COUGH DROPS  USE SUGARLESS CANDY INSTEAD (Jolley ranchers or Stover's or Life Savers) or even ice chips will also do - the key is to swallow to prevent all throat clearing. NO OIL BASED VITAMINS - use powdered substitutes.  Avoid fish oil when coughing.   Also  Ok to try albuterol 15 min before an activity (on alternating days)  that you know would usually make you short of breath and see if it makes any difference and if makes none then don't take albuterol after activity unless you can't catch your breath as this means it's the resting that helps, not the albuterol.   Please remember to go to the lab department   for your tests - we will call you with the results when they are available.      Please remember to go to the  x-ray department  for your tests - we will call you with the results when they are available     Please schedule a follow up office visit in 6  weeks, call sooner if needed

## 2023-08-01 NOTE — Assessment & Plan Note (Signed)
07/06/2023   Walked on RA  x   approx 75 slow  ft  @ slow pace, stopped due to fatigue > sob   with lowest 02 sats 99%   - 08/01/2023   Walked on RA  x  1  lap(s) =  approx 250  ft  @ slow pace, stopped due to fatigue s sob  with lowest 02 sats 98%   Symptoms are markedly disproportionate to objective findings and not clear to what extent this is actually a pulmonary  problem but pt does appear to have difficult to sort out respiratory symptoms of unknown origin for which  DDX  = almost all start with A and  include Adherence, Ace Inhibitors, Acid Reflux, Active Sinus Disease, Alpha 1 Antitripsin deficiency, Anxiety masquerading as Airways dz,  ABPA,  Allergy(esp in young), Aspiration (esp in elderly), Adverse effects of meds,  Active smoking or Vaping, A bunch of PE's/clot burden (a few small clots can't cause this syndrome unless there is already severe underlying pulm or vascular dz with poor reserve),  Anemia or thyroid disorder, plus two Bs  = Bronchiectasis and Beta blocker use..and one C= CHF    Adherence is always the initial "prime suspect" and is a multilayered concern that requires a "trust but verify" approach in every patient - starting with knowing how to use medications, especially inhalers, correctly, keeping up with refills and understanding the fundamental difference between maintenance and prns vs those medications only taken for a very short course and then stopped and not refilled.  - see hfa teaching - return with all meds in hand using a trust but verify approach to confirm accurate Medication  Reconciliation The principal here is that until we are certain that the  patients are doing what we've asked, it makes no sense to ask them to do more.   ? Acid (or non-acid) GERD > always difficult to exclude as up to 75% of pts in some series report no assoc GI/ Heartburn symptoms> rec max (24h)  acid suppression and diet restrictions/ reviewed and instructions given in writing.   ? Allergy  /asthma > very little evidence for uncontrolled asthma given  FENO = 20 and no increase Eos 0.1 off systemic steroids and on symbicort 160/singular with ? Benefit from saba:  Re SABA :  I spent extra time with pt today reviewing appropriate use of albuterol for prn use on exertion with the following points:  ? Anxiety/depression/ decondtioning > usually at the bottom of this list of usual suspects but should be much higher on this pt's based on H and P and note already on psychotropics and may interfere with adherence and also interpretation of response or lack thereof to symptom management which can be quite subjective.  1) saba is for relief of sob that does not improve by walking a slower pace or resting but rather if the pt does not improve after trying this first. 2) If the pt is convinced, as many are, that saba helps recover from activity faster then it's easy to tell if this is the case by re-challenging : ie stop, take the inhaler, then p 5 minutes try the exact same activity (intensity of workload) that just caused the symptoms and see if they are substantially diminished or not after saba 3) if there is an activity that reproducibly causes the symptoms, try the saba 15 min before the activity on alternate days   If in fact the saba really does help, then  fine to continue to use it prn but advised may need to look closer at the maintenance regimen (for now symbicort 160 and montelukast)being used to achieve better control of airways disease with exertion.   ? PE very unlikely on eliquis  ? CHF  > cm on cxr bu no overt chf / note though last LVEDP was 28 so needs to keep up with cards and cards meds

## 2023-08-07 ENCOUNTER — Ambulatory Visit (INDEPENDENT_AMBULATORY_CARE_PROVIDER_SITE_OTHER): Payer: 59 | Admitting: Family

## 2023-08-07 ENCOUNTER — Telehealth: Payer: Self-pay | Admitting: Family

## 2023-08-07 VITALS — BP 142/86 | HR 73 | Resp 18 | Ht 66.0 in | Wt 173.2 lb

## 2023-08-07 DIAGNOSIS — R300 Dysuria: Secondary | ICD-10-CM

## 2023-08-07 DIAGNOSIS — R109 Unspecified abdominal pain: Secondary | ICD-10-CM | POA: Diagnosis not present

## 2023-08-07 LAB — POCT URINALYSIS DIPSTICK
Bilirubin, UA: NEGATIVE
Blood, UA: NEGATIVE
Glucose, UA: POSITIVE — AB
Ketones, UA: NEGATIVE
Leukocytes, UA: NEGATIVE
Nitrite, UA: NEGATIVE
Protein, UA: NEGATIVE
Spec Grav, UA: 1.01 (ref 1.010–1.025)
Urobilinogen, UA: 0.2 U/dL
pH, UA: 6 (ref 5.0–8.0)

## 2023-08-07 MED ORDER — CEFUROXIME AXETIL 250 MG PO TABS: 250.0000 mg | ORAL_TABLET | Freq: Two times a day (BID) | ORAL | 0 refills | Status: AC

## 2023-08-07 MED ORDER — TRAMADOL HCL 50 MG PO TABS: 50.0000 mg | ORAL_TABLET | Freq: Three times a day (TID) | ORAL | 0 refills | Status: AC | PRN

## 2023-08-07 NOTE — Telephone Encounter (Signed)
Spoke with pt, pt is aware Rx has been sent in and expressed understanding.

## 2023-08-07 NOTE — Telephone Encounter (Signed)
I sent in short term refill on Tramadol which she has used for pain. We will let her know as soon as we get the urine culture results back later this week.

## 2023-08-07 NOTE — Progress Notes (Signed)
[name] is a 87 y.o. female with the following history as recorded in Epic[employer]:  Patient Active Problem List   Diagnosis Date Noted   Acute bronchitis 05/03/2023   Acute bacterial rhinosinusitis 05/03/2023   CKD (chronic kidney disease) stage 4, GFR 15-29 ml/min (HCC) 06/27/2022   Mitral stenosis 04/17/2022   Acute kidney injury superimposed on chronic kidney disease (HCC) 01/02/2022   NSTEMI (non-ST elevated myocardial infarction) (HCC) 12/23/2021   Osteoarthritis of knee 03/30/2021   Bilateral hand pain 03/08/2021   Complete heart block (HCC) 12/13/2020   Pacemaker - MDT 12/13/2020   DOE (dyspnea on exertion) 07/29/2020   Encntr for surgical aftcr following surgery on the circ sys 07/26/2020   Pain in right knee 03/17/2020   Chronic combined systolic (congestive) and diastolic (congestive) heart failure (HCC)    DKA (diabetic ketoacidosis) (HCC) 12/20/2019   Diverticulosis 12/10/2019   Age-related osteoporosis without current pathological fracture 10/03/2019   Chronic kidney disease, stage 3 unspecified (HCC) 10/03/2019   Long term (current) use of insulin (HCC) 10/03/2019   Hyperlipidemia, unspecified 10/03/2019   Shortness of breath 01/30/2018   Acute bronchiolitis 12/17/2017   Wheezing 10/29/2017   Vitamin D deficiency 07/31/2017   Renal insufficiency 07/31/2017   Knee pain 06/01/2017   Morbid obesity due to excess calories (HCC) 01/25/2017   Diabetes (HCC) 04/12/2016   Cough 01/20/2015   Atrial fibrillation (HCC) 05/28/2014   History of colonic polyps 05/28/2014   Vomiting 01/29/2014   CAD (coronary artery disease) 09/01/2013   Routine general medical examination at a health care facility 07/17/2011   Encounter for long-term (current) use of other medications 07/06/2011   Nonspecific (abnormal) findings on radiological and other examination of body structure 11/09/2009   IRRITABLE BOWEL SYNDROME 05/07/2009   CHEST PAIN 05/07/2009   ADNEXAL MASS, RIGHT 07/22/2008    HEMORRHOIDS, RECURRENT 01/27/2008   Diverticulitis 01/27/2008   OTH ABNORMAL FIND RAD EXAMINATION BREAST 01/27/2008   GERD 01/13/2008   UTI 09/28/2007   Dyslipidemia 05/27/2007   ANEMIA-IRON DEFICIENCY 05/27/2007   ANXIETY 05/27/2007   Essential hypertension 05/27/2007   Seasonal allergic rhinitis 05/27/2007   Cough variant asthma 05/27/2007   Osteoporosis 05/27/2007   DIVERTICULITIS, HX OF 05/27/2007    Current Outpatient Medications  Medication Sig Dispense Refill   acetaminophen (TYLENOL) 325 MG tablet Take 650 mg by mouth every 6 (six) hours as needed for pain.     albuterol (PROAIR HFA) 108 (90 Base) MCG/ACT inhaler 2 puffs every 4 hours as needed only  if your can't catch your breath 18 g 11   albuterol (PROVENTIL) (2.5 MG/3ML) 0.083% nebulizer solution Take 3 mLs (2.5 mg total) by nebulization every 6 (six) hours as needed for wheezing or shortness of breath. 150 mL 1   Ascorbic Acid (VITAMIN C) 1000 MG tablet Take 1,000 mg by mouth daily.     atorvastatin (LIPITOR) 80 MG tablet TAKE 1 TABLET BY MOUTH EVERY DAY 90 tablet 3   BD VEO INSULIN SYRINGE U/F 31G X 15/64" 0.5 ML MISC USE AS INSTRUCTED 100 each 3   benzonatate (TESSALON) 200 MG capsule Take 1 capsule (200 mg total) by mouth 3 (three) times daily as needed. 30 capsule 1   Blood Glucose Monitoring Suppl (ONETOUCH VERIO FLEX SYSTEM) w/Device KIT USE AS DIRECTED 1 kit .   budesonide-formoterol (SYMBICORT) 160-4.5 MCG/ACT inhaler TAKE 2 PUFFS FIRST THING IN AM AND THEN ANOTHER 2 PUFFS ABOUT 12 HOURS LATER. 30.6 each 6   carvedilol (COREG) 3.125  MG tablet TAKE 1 TABLET BY MOUTH TWICE A DAY WITH A MEAL 180 tablet 0   cefUROXime (CEFTIN) 250 MG tablet Take 1 tablet (250 mg total) by mouth 2 (two) times daily with a meal for 5 days. 10 tablet 0   cetirizine (ZYRTEC ALLERGY) 10 MG tablet Take 1 tablet (10 mg total) by mouth daily as needed for allergies. 30 tablet 5   cholecalciferol (VITAMIN D3) 25 MCG (1000 UNIT) tablet Take  1,000 Units by mouth daily. Isn't taking regularly     clopidogrel (PLAVIX) 75 MG tablet TAKE 1 TABLET BY MOUTH DAILY WITH BREAKFAST. 90 tablet 2   diclofenac Sodium (VOLTAREN) 1 % GEL Apply 4 g topically 4 (four) times daily as needed (pain).     ELIQUIS 2.5 MG TABS tablet TAKE 1 TABLET BY MOUTH TWICE A DAY 60 tablet 5   famotidine (PEPCID) 20 MG tablet TAKE 1 TABLET BY MOUTH TWICE A DAY 180 tablet 1   FARXIGA 5 MG TABS tablet TAKE 1 TABLET BY MOUTH EVERY DAY BEFORE BREAKFAST 30 tablet 3   fluticasone (FLONASE) 50 MCG/ACT nasal spray Place 2 sprays into both nostrils daily. (Patient taking differently: Place 2 sprays into both nostrils as needed.) 16 g 5   furosemide (LASIX) 40 MG tablet Take 1 tablet (40 mg total) by mouth 2 (two) times daily. 180 tablet 3   glucose blood (ONETOUCH VERIO) test strip 1 each by Other route 2 (two) times daily. And lancets 2/day 200 each 3   glucose blood test strip Check blood sugar twice a day 100 each 12   hydrALAZINE (APRESOLINE) 25 MG tablet Take 3 tablets (75 mg total) by mouth 3 (three) times daily. 810 tablet 3   insulin lispro (HUMALOG) 100 UNIT/ML injection Give 3 units with BREAKFAST, AND 18 units with SUPPER 60 mL 1   latanoprost (XALATAN) 0.005 % ophthalmic solution Place 1 drop into both eyes at bedtime.     montelukast (SINGULAIR) 10 MG tablet TAKE 1 TABLET BY MOUTH EVERYDAY AT BEDTIME 90 tablet 1   nitroGLYCERIN (NITROSTAT) 0.4 MG SL tablet Place 1 tablet (0.4 mg total) under the tongue every 5 (five) minutes x 3 doses as needed for chest pain. 25 tablet 2   ondansetron (ZOFRAN) 8 MG tablet Take 1 tablet (8 mg total) by mouth every 8 (eight) hours as needed for nausea or vomiting. 20 tablet 0   pantoprazole (PROTONIX) 40 MG tablet TAKE 1 TABLET BY MOUTH TWICE A DAY 180 tablet 3   potassium chloride (KLOR-CON) 10 MEQ tablet TAKE 1 TABLET BY MOUTH EVERY DAY 90 tablet 1   promethazine-dextromethorphan (PROMETHAZINE-DM) 6.25-15 MG/5ML syrup Take 5 mLs by  mouth 4 (four) times daily as needed for cough. 240 mL 0   sodium bicarbonate 650 MG tablet Take 650 mg by mouth 2 (two) times daily.     Syringe/Needle, Disp, (SYRINGE 3CC/25GX1") 25G X 1" 3 ML MISC Use to inject into the skin 2x a day 100 each 3   traZODone (DESYREL) 50 MG tablet Take 0.5 tablets (25 mg total) by mouth at bedtime as needed for sleep. 15 tablet 0   vitamin B-12 (CYANOCOBALAMIN) 100 MCG tablet Take 100 mcg by mouth daily.     traMADol (ULTRAM) 50 MG tablet Take 1 tablet (50 mg total) by mouth every 8 (eight) hours as needed for up to 5 days. 15 tablet 0   No current facility-administered medications for this visit.    Allergies: Aspirin and Ramipril  Past Medical History:  Diagnosis Date   Allergic rhinitis    Anterior chest wall pain    Anxiety    Asthma    Chronic diastolic CHF (congestive heart failure) (HCC)    Echo 01/2020: EF 60-65, normal wall motion, mild LVH, normal RV SF, RVSP 52.6 (moderate elevation), severe LAE, moderate RAE, trivial MR, mild MS (mean gradient 5.5 mmHg), mild aortic valve sclerosis (no AS); elevated E/e' c/w elevated LVEDP   Cough    DM type 2 (diabetes mellitus, type 2) (HCC)    GERD (gastroesophageal reflux disease)    History of diverticulitis of colon    HTN (hypertension)    Hyperlipidemia    IBS (irritable bowel syndrome)    Iron deficiency anemia    Mitral stenosis 04/17/2022   Echo 04/2022: EF 55-60, no RWMA, mild LVH, normal RVSF, normal PASP, moderate LAE, moderate mitral stenosis (mean gradient 4 mmHg), trivial MR, trivial AI, AV sclerosis without stenosis   Osteoporosis, unspecified    Renal insufficiency 07/31/2017   UTI (urinary tract infection)     Past Surgical History:  Procedure Laterality Date   CARDIOVASCULAR STRESS TEST  02/25/04   CORONARY BALLOON ANGIOPLASTY N/A 12/28/2021   Procedure: CORONARY BALLOON ANGIOPLASTY;  Surgeon: Kathleene Hazel, MD;  Location: [facility];  Service: Cardiovascular;   Laterality: N/A;   CORONARY STENT INTERVENTION N/A 12/28/2021   Procedure: CORONARY STENT INTERVENTION;  Surgeon: Kathleene Hazel, MD;  Location: [facility];  Service: Cardiovascular;  Laterality: N/A;   ESOPHAGOGASTRODUODENOSCOPY  12/26/01   PACEMAKER IMPLANT N/A 08/02/2020   Procedure: PACEMAKER IMPLANT;  Surgeon: Duke Salvia, MD;  Location: [facility] IN[facility]IVE CV LAB;  Service: Cardiovascular;  Laterality: N/A;   RIGHT OOPHORECTOMY  jan 2010   RIGHT/LEFT HEART CATH AND CORONARY ANGIOGRAPHY N/A 12/28/2021   Procedure: RIGHT/LEFT HEART CATH AND CORONARY ANGIOGRAPHY;  Surgeon: Kathleene Hazel, MD;  Location: [facility];  Service: Cardiovascular;  Laterality: N/A;    Family History  Problem Relation Age of Onset   Hypertension Mother    Stroke Mother    Other Father        poor circulation   Cancer Sister    Lung cancer Brother    Melanoma Brother    Diabetes Daughter    Colon cancer Neg Hx    Liver disease Neg Hx    Esophageal cancer Neg Hx     Social History   Tobacco Use   Smoking status: Former    Current packs/day: 0.00    Average packs/day: 0.3 packs/day for 5.0 years (1.5 ttl pk-yrs)    Types: Cigarettes    Start date: 10/02/1981    Quit date: 10/02/1986    Years since quitting: 36.8   Smokeless tobacco: Never  Substance Use Topics   Alcohol use: No    Subjective:   Accompanied by daughter; patient notes she has been having urinary burning x 3-4 days; last night felt some left sided flank pain; no blood in urine of fever; + nausea, no vomiting; notes her left lower back "just started hurting last night while she was sitting in the bed." Notes that she just has not been feeling well for the past few days; Notes that she did have a normal bowel movement last night; Has been using Tylenol with limited relief- daughter is asking if pain medication can be prescribed;     Objective:  Vitals:   08/07/23 0905  BP: (!) 142/86  Pulse:  73  Resp: 18   SpO2: 99%  Weight: 173 lb 3.2 oz (78.6 kg)  Height: 5\' 6"  (1.676 m)    General: Well developed, well nourished, in no acute distress  Skin : Warm and dry.  Head: Normocephalic and atraumatic  Eyes: Sclera and conjunctiva clear; pupils round and reactive to light; extraocular movements intact  Ears: External normal; canals clear; tympanic membranes normal  Oropharynx: Pink, supple. No suspicious lesions  Neck: Supple without thyromegaly, adenopathy  Lungs: Respirations unlabored; clear to auscultation bilaterally without wheeze, rales, rhonchi  CVS exam: normal rate and regular rhythm.  Musculoskeletal: No deformities; no active joint inflammation  Extremities: No edema, cyanosis, clubbing  Vessels: Symmetric bilaterally  Neurologic: Alert and oriented; speech intact; face symmetrical; moves all extremities well; CNII-XII intact without focal deficit    Assessment:  1. Dysuria   2. Flank pain     Plan:  Will check U/A and urine culture; will go ahead and treat for suspected UTI- Rx for Ceftin 250 mg bid x 5 days; will call in short-term refill on Tramadol to help with pain as well; increase fluids, rest; follow up to be determined based on urine culture results.   No follow-ups on file.  Orders Placed This Encounter  Procedures   Urine Culture   POCT Urinalysis Dipstick    Requested Prescriptions   Signed Prescriptions Disp Refills   cefUROXime (CEFTIN) 250 MG tablet 10 tablet 0    Sig: Take 1 tablet (250 mg total) by mouth 2 (two) times daily with a meal for 5 days.   traMADol (ULTRAM) 50 MG tablet 15 tablet 0    Sig: Take 1 tablet (50 mg total) by mouth every 8 (eight) hours as needed for up to 5 days.

## 2023-08-08 ENCOUNTER — Encounter: Payer: Self-pay | Admitting: Internal Medicine

## 2023-08-08 ENCOUNTER — Ambulatory Visit (INDEPENDENT_AMBULATORY_CARE_PROVIDER_SITE_OTHER): Payer: 59 | Admitting: Podiatry

## 2023-08-08 ENCOUNTER — Ambulatory Visit (INDEPENDENT_AMBULATORY_CARE_PROVIDER_SITE_OTHER): Payer: 59 | Admitting: Internal Medicine

## 2023-08-08 VITALS — BP 132/80 | HR 79 | Ht 66.0 in | Wt 173.0 lb

## 2023-08-08 DIAGNOSIS — M2041 Other hammer toe(s) (acquired), right foot: Secondary | ICD-10-CM

## 2023-08-08 DIAGNOSIS — M2012 Hallux valgus (acquired), left foot: Secondary | ICD-10-CM | POA: Diagnosis not present

## 2023-08-08 DIAGNOSIS — E119 Type 2 diabetes mellitus without complications: Secondary | ICD-10-CM | POA: Diagnosis not present

## 2023-08-08 DIAGNOSIS — R1032 Left lower quadrant pain: Secondary | ICD-10-CM

## 2023-08-08 DIAGNOSIS — E1151 Type 2 diabetes mellitus with diabetic peripheral angiopathy without gangrene: Secondary | ICD-10-CM | POA: Diagnosis not present

## 2023-08-08 DIAGNOSIS — M79674 Pain in right toe(s): Secondary | ICD-10-CM | POA: Diagnosis not present

## 2023-08-08 DIAGNOSIS — R933 Abnormal findings on diagnostic imaging of other parts of digestive tract: Secondary | ICD-10-CM

## 2023-08-08 DIAGNOSIS — K5900 Constipation, unspecified: Secondary | ICD-10-CM

## 2023-08-08 DIAGNOSIS — R110 Nausea: Secondary | ICD-10-CM | POA: Diagnosis not present

## 2023-08-08 DIAGNOSIS — L84 Corns and callosities: Secondary | ICD-10-CM | POA: Diagnosis not present

## 2023-08-08 DIAGNOSIS — B351 Tinea unguium: Secondary | ICD-10-CM

## 2023-08-08 DIAGNOSIS — M2011 Hallux valgus (acquired), right foot: Secondary | ICD-10-CM | POA: Diagnosis not present

## 2023-08-08 DIAGNOSIS — R1310 Dysphagia, unspecified: Secondary | ICD-10-CM

## 2023-08-08 DIAGNOSIS — M2042 Other hammer toe(s) (acquired), left foot: Secondary | ICD-10-CM

## 2023-08-08 DIAGNOSIS — M79675 Pain in left toe(s): Secondary | ICD-10-CM | POA: Diagnosis not present

## 2023-08-08 LAB — URINE CULTURE
MICRO NUMBER:: 15688151
Result:: NO GROWTH
SPECIMEN QUALITY:: ADEQUATE

## 2023-08-08 MED ORDER — ONDANSETRON HCL 8 MG PO TABS: 8.0000 mg | ORAL_TABLET | Freq: Three times a day (TID) | ORAL | 2 refills | Status: DC | PRN

## 2023-08-08 MED ORDER — PANTOPRAZOLE SODIUM 40 MG PO TBEC: 40.0000 mg | DELAYED_RELEASE_TABLET | Freq: Every day | ORAL | 3 refills | Status: DC

## 2023-08-08 NOTE — Progress Notes (Signed)
Chief Complaint: Dysphagia  HPI : 87 year old female with history of HFpEF, A-fib on Eliquis, CAD on Plavix, DM, GERD, asthma, IBS, prior diverticulitis presents for follow up of dysphagia  Patient presents with dysphagia for years. She states that dysphagia has been present since her EGD procedure in 2009.  Her dysphagia has been stable in severity.  Endorses dysphagia to both solids and liquids.  Dysphagia tends to occur in the bottom of her throat, and sometimes she has to regurgitate food back up after eating it.   Interval History: She has had LLQ ab pain for a long time. The pain flares up on occasion but has not worsened significantly. Sometimes she can be constipated.  Constipation seems to make her abdominal pain worse.  Having a BM makes her abdominal pain better. She is having one BM once every 3-4 days. She takes Miralax PRN. Swallowing is about the same as in the past. She is still having dysphagia. She completed her fluconazole therapy. Denies chest burning or regurgitation. She is currently on PPI BID but is not sure if this is helping her.   Past Medical History:  Diagnosis Date   Allergic rhinitis    Anterior chest wall pain    Anxiety    Asthma    Chronic diastolic CHF (congestive heart failure) (HCC)    Echo 01/2020: EF 60-65, normal wall motion, mild LVH, normal RV SF, RVSP 52.6 (moderate elevation), severe LAE, moderate RAE, trivial MR, mild MS (mean gradient 5.5 mmHg), mild aortic valve sclerosis (no AS); elevated E/e' c/w elevated LVEDP   Cough    DM type 2 (diabetes mellitus, type 2) (HCC)    GERD (gastroesophageal reflux disease)    History of diverticulitis of colon    HTN (hypertension)    Hyperlipidemia    IBS (irritable bowel syndrome)    Iron deficiency anemia    Mitral stenosis 04/17/2022   Echo 04/2022: EF 55-60, no RWMA, mild LVH, normal RVSF, normal PASP, moderate LAE, moderate mitral stenosis (mean gradient 4 mmHg), trivial MR, trivial AI, AV sclerosis  without stenosis   Osteoporosis, unspecified    Renal insufficiency 07/31/2017   UTI (urinary tract infection)      Past Surgical History:  Procedure Laterality Date   CARDIOVASCULAR STRESS TEST  02/25/04   CORONARY BALLOON ANGIOPLASTY N/A 12/28/2021   Procedure: CORONARY BALLOON ANGIOPLASTY;  Surgeon: Kathleene Hazel, MD;  Location: [facility];  Service: Cardiovascular;  Laterality: N/A;   CORONARY STENT INTERVENTION N/A 12/28/2021   Procedure: CORONARY STENT INTERVENTION;  Surgeon: Kathleene Hazel, MD;  Location: [facility];  Service: Cardiovascular;  Laterality: N/A;   ESOPHAGOGASTRODUODENOSCOPY  12/26/01   PACEMAKER IMPLANT N/A 08/02/2020   Procedure: PACEMAKER IMPLANT;  Surgeon: Duke Salvia, MD;  Location: [facility] INVASIVE CV LAB;  Service: Cardiovascular;  Laterality: N/A;   RIGHT OOPHORECTOMY  jan 2010   RIGHT/LEFT HEART CATH AND CORONARY ANGIOGRAPHY N/A 12/28/2021   Procedure: RIGHT/LEFT HEART CATH AND CORONARY ANGIOGRAPHY;  Surgeon: Kathleene Hazel, MD;  Location: [facility];  Service: Cardiovascular;  Laterality: N/A;   Family History  Problem Relation Age of Onset   Hypertension Mother    Stroke Mother    Other Father        poor circulation   Cancer Sister    Lung cancer Brother    Melanoma Brother    Diabetes Daughter    Colon cancer Neg Hx    Liver disease  Neg Hx    Esophageal cancer Neg Hx    Social History   Tobacco Use   Smoking status: Former    Current packs/day: 0.00    Average packs/day: 0.3 packs/day for 5.0 years (1.5 ttl pk-yrs)    Types: Cigarettes    Start date: 10/02/1981    Quit date: 10/02/1986    Years since quitting: 36.8   Smokeless tobacco: Never  Vaping Use   Vaping status: Never Used  Substance Use Topics   Alcohol use: No   Drug use: No   Current Outpatient Medications  Medication Sig Dispense Refill   acetaminophen (TYLENOL) 325 MG tablet Take 650 mg by mouth every 6 (six) hours as needed  for pain.     albuterol (PROAIR HFA) 108 (90 Base) MCG/ACT inhaler 2 puffs every 4 hours as needed only  if your can't catch your breath 18 g 11   albuterol (PROVENTIL) (2.5 MG/3ML) 0.083% nebulizer solution Take 3 mLs (2.5 mg total) by nebulization every 6 (six) hours as needed for wheezing or shortness of breath. 150 mL 1   Ascorbic Acid (VITAMIN C) 1000 MG tablet Take 1,000 mg by mouth daily.     atorvastatin (LIPITOR) 80 MG tablet TAKE 1 TABLET BY MOUTH EVERY DAY 90 tablet 3   BD VEO INSULIN SYRINGE U/F 31G X 15/64" 0.5 ML MISC USE AS INSTRUCTED 100 each 3   benzonatate (TESSALON) 200 MG capsule Take 1 capsule (200 mg total) by mouth 3 (three) times daily as needed. 30 capsule 1   Blood Glucose Monitoring Suppl (ONETOUCH VERIO FLEX SYSTEM) w/Device KIT USE AS DIRECTED 1 kit .   budesonide-formoterol (SYMBICORT) 160-4.5 MCG/ACT inhaler TAKE 2 PUFFS FIRST THING IN AM AND THEN ANOTHER 2 PUFFS ABOUT 12 HOURS LATER. 30.6 each 6   carvedilol (COREG) 3.125 MG tablet TAKE 1 TABLET BY MOUTH TWICE A DAY WITH A MEAL 180 tablet 0   cefUROXime (CEFTIN) 250 MG tablet Take 1 tablet (250 mg total) by mouth 2 (two) times daily with a meal for 5 days. 10 tablet 0   cetirizine (ZYRTEC ALLERGY) 10 MG tablet Take 1 tablet (10 mg total) by mouth daily as needed for allergies. 30 tablet 5   cholecalciferol (VITAMIN D3) 25 MCG (1000 UNIT) tablet Take 1,000 Units by mouth daily. Isn't taking regularly     clopidogrel (PLAVIX) 75 MG tablet TAKE 1 TABLET BY MOUTH DAILY WITH BREAKFAST. 90 tablet 2   diclofenac Sodium (VOLTAREN) 1 % GEL Apply 4 g topically 4 (four) times daily as needed (pain).     ELIQUIS 2.5 MG TABS tablet TAKE 1 TABLET BY MOUTH TWICE A DAY 60 tablet 5   famotidine (PEPCID) 20 MG tablet TAKE 1 TABLET BY MOUTH TWICE A DAY 180 tablet 1   FARXIGA 5 MG TABS tablet TAKE 1 TABLET BY MOUTH EVERY DAY BEFORE BREAKFAST 30 tablet 3   fluticasone (FLONASE) 50 MCG/ACT nasal spray Place 2 sprays into both nostrils  daily. (Patient taking differently: Place 2 sprays into both nostrils as needed.) 16 g 5   furosemide (LASIX) 40 MG tablet Take 1 tablet (40 mg total) by mouth 2 (two) times daily. 180 tablet 3   glucose blood (ONETOUCH VERIO) test strip 1 each by Other route 2 (two) times daily. And lancets 2/day 200 each 3   glucose blood test strip Check blood sugar twice a day 100 each 12   hydrALAZINE (APRESOLINE) 25 MG tablet Take 3 tablets (75 mg total) by  mouth 3 (three) times daily. 810 tablet 3   insulin lispro (HUMALOG) 100 UNIT/ML injection Give 3 units with BREAKFAST, AND 18 units with SUPPER 60 mL 1   latanoprost (XALATAN) 0.005 % ophthalmic solution Place 1 drop into both eyes at bedtime.     montelukast (SINGULAIR) 10 MG tablet TAKE 1 TABLET BY MOUTH EVERYDAY AT BEDTIME 90 tablet 1   nitroGLYCERIN (NITROSTAT) 0.4 MG SL tablet Place 1 tablet (0.4 mg total) under the tongue every 5 (five) minutes x 3 doses as needed for chest pain. 25 tablet 2   ondansetron (ZOFRAN) 8 MG tablet Take 1 tablet (8 mg total) by mouth every 8 (eight) hours as needed for nausea or vomiting. 20 tablet 0   pantoprazole (PROTONIX) 40 MG tablet TAKE 1 TABLET BY MOUTH TWICE A DAY 180 tablet 3   potassium chloride (KLOR-CON) 10 MEQ tablet TAKE 1 TABLET BY MOUTH EVERY DAY 90 tablet 1   promethazine-dextromethorphan (PROMETHAZINE-DM) 6.25-15 MG/5ML syrup Take 5 mLs by mouth 4 (four) times daily as needed for cough. 240 mL 0   sodium bicarbonate 650 MG tablet Take 650 mg by mouth 2 (two) times daily.     Syringe/Needle, Disp, (SYRINGE 3CC/25GX1") 25G X 1" 3 ML MISC Use to inject into the skin 2x a day 100 each 3   traMADol (ULTRAM) 50 MG tablet Take 1 tablet (50 mg total) by mouth every 8 (eight) hours as needed for up to 5 days. 15 tablet 0   traZODone (DESYREL) 50 MG tablet Take 0.5 tablets (25 mg total) by mouth at bedtime as needed for sleep. 15 tablet 0   vitamin B-12 (CYANOCOBALAMIN) 100 MCG tablet Take 100 mcg by mouth daily.      No current facility-administered medications for this visit.   Allergies  Allergen Reactions   Aspirin Shortness Of Breath and Other (See Comments)    Caused asthma symptoms   Ramipril Cough     Physical Exam: BP 132/80   Pulse 79   Ht 5\' 6"  (1.676 m)   Wt 173 lb (78.5 kg)   BMI 27.92 kg/m  Constitutional: Pleasant,well-developed, female in no acute distress. HEENT: Normocephalic and atraumatic. No oral thrush visualized Cardiovascular: Normal rate, regular rhythm.  Pulmonary/chest: Effort normal and breath sounds normal. No wheezing, rales or rhonchi. Abdominal: Soft, nondistended, nontender. Bowel sounds active throughout. There are no masses palpable. No hepatomegaly. Extremities: No edema Neurological: Alert and oriented to person place and time. Skin: Skin is warm and dry. No rashes noted. Psychiatric: Normal mood and affect. Behavior is normal.  Labs 04/2023: CMP with nml LFTs, low Na of 133, elevated Cr of 2.74, and glucose of 471.   Labs 05/2023: CBC with low plts of 110. BMP with elevated Cr of 2.82 and mildly low Na of 134. BNP mildly elevated at 214. TSH nml. ESR nml.   Gastric emptying study 02/20/14: IMPRESSION:  Normal exam.   HIDA scan 12/25/21: IMPRESSION: Normal hepatobiliary scan, demonstrating patency of both the cystic and common bile ducts.  CT A/P w/o contrast 07/19/22: IMPRESSION: 1. No acute abdominopelvic findings. 2. Colonic diverticulosis without acute diverticulitis. 3. Cholelithiasis without evidence of acute cholecystitis. 4. Punctate nonobstructing left lower pole renal calculus. 5. Small hiatal hernia. 6. Multichamber cardiomegaly with coronary artery calcifications and aortic valvular and mitral annular calcifications. 7. Similar bibasilar linear atelectasis/scarring with subsegmental mucous plugging and mild bronchiectasis left lower lobe. 8. Aortic Atherosclerosis (ICD10-I70.0).  Chest CT w/o contrast 04/27/23: IMPRESSION: 1.  Bronchial wall  thickening and mucous plugging in the lower lobes greatest in the left lower lobe. 2. Small hiatal hernia with mild wall thickening of the lower esophagus suggesting esophagitis. 3. Cholelithiasis.  Barium swallow 06/22/23: IMPRESSION: 1. Presbyesophagus with tertiary contractions and corkscrew appearance in the mid and distal esophagus. The contractions and intermittent spasms limit our ability to assess for esophageal narrowings. 2. Initially substantially delayed emptying of the esophagus into the stomach in the upright position. Suspected component of esophageal spasm. Subsequently the esophagus did spontaneously empty into the stomach in the upright position. 3. Small type 1 hiatal hernia. 4. 13 mm barium tablet impacted just above the hiatal hernia.  EGD 04/02/08:   Colonoscopy 12/18/19: Excellent prep.    ASSESSMENT AND PLAN: Dysphagia Abnormal barium swallow Chronic LLQ ab pain Nausea Constipation Presents with dysphagia for years.  Her last EGD in 2009 showed Candida esophagitis that was treated with fluconazole and gastritis.  A course of fluconazole was given due to concern for oral thrush during her last clinic appointment.  However patient has not had significant improvement in her dysphagia after a course of fluconazole therapy.  Her oral thrush is no longer present on exam today.  Patient's last barium swallow did show a small hiatal hernia and her barium tablet impacted at just above the hiatal hernia.  Patient may be at risk for having an esophageal stricture that is leading to her dysphagia.  However patient has been hesitant in the past to undergo EGD because she would like to avoid sedation if at all possible due to her age and comorbidities.  She continues to decline an EGD at this time and would prefer conservative measures.  Thus I went over suggestions on how to improve her swallowing, including eating softer foods, chewing carefully and swallowing  fully before taking another bite of food, and sitting fully upright when eating.  Not clear to me that her pantoprazole therapy has been beneficial to her dysphagia so will decrease her pantoprazole to once daily.  Patient also mentions today that she has had some chronic left lower abdominal pain that seems to be related to her constipation issues.  I asked her to increase her hydration and start daily fiber supplement.  She can continue to use MiraLAX, especially if she were to get more constipated or have more abdominal pain issues.  Her nausea may also be related to her constipation. - Increase hydration - Start daily fiber supplement  - Continue to take Miralax - Declined EGD at this time - Encourage soft foods - Refill Zofran - Decrease pantoprazole 40 mg BID to QD - RTC 3 months  Eulah Pont, MD  I spent 46 minutes of time, including in depth chart review, independent review of results as outlined above, communicating results with the patient directly, face-to-face time with the patient, coordinating care, ordering studies and medications as appropriate, and documentation.

## 2023-08-08 NOTE — Patient Instructions (Addendum)
Drink 8 cups of water a day and walk 30 minutes a day.  Please purchase the following medications over the counter and take as directed: Fiber supplement such as Benefiber- use as directed daily Miralax: Take as  directed up to 3 times a day to achieve regular bowel movements  Eat soft foods  We have sent the following medications to your pharmacy for you to pick up at your convenience: Pantoprazole, Zofran  You are scheduled for a follow up on 11/13/22 at 1:30 pm  _______________________________________________________  If your blood pressure at your visit was 140/90 or greater, please contact your primary care physician to follow up on this.  _______________________________________________________  If you are age 3 or older, your body mass index should be between 23-30. Your Body mass index is 27.92 kg/m. If this is out of the aforementioned range listed, please consider follow up with your Primary Care Provider.  If you are age 85 or younger, your body mass index should be between 19-25. Your Body mass index is 27.92 kg/m. If this is out of the aformentioned range listed, please consider follow up with your Primary Care Provider.   ________________________________________________________  The [facility] providers would like to encourage you to use [facility] to communicate with providers for non-urgent requests or questions.  Due to long hold times on the telephone, sending your provider a message by [facility] may be a faster and more efficient way to get a response.  Please allow 48 business hours for a response.  Please remember that this is for non-urgent requests.  _______________________________________________________  Thank you for entrusting me with your care and for choosing [facility], Dr. Eulah Pont

## 2023-08-08 NOTE — Progress Notes (Signed)
ANNUAL DIABETIC FOOT EXAM  Subjective: [name] presents today for annual diabetic foot exam. Chief Complaint  Patient presents with   Diabetes    DFC BS - DIDN'T CHECK IT  A1C - 6 LVPCP - 05/2023   Patient confirms h/o diabetes.  Patient denies any h/o foot wounds. She does have healing scab on left 3rd toe, but was unaware of injury. States she may have hit her toe on something.  Patient has been diagnosed with neuropathy.  Olive Bass, FNP is patient's PCP.  Past Medical History:  Diagnosis Date   Allergic rhinitis    Anterior chest wall pain    Anxiety    Asthma    Chronic diastolic CHF (congestive heart failure) (HCC)    Echo 01/2020: EF 60-65, normal wall motion, mild LVH, normal RV SF, RVSP 52.6 (moderate elevation), severe LAE, moderate RAE, trivial MR, mild MS (mean gradient 5.5 mmHg), mild aortic valve sclerosis (no AS); elevated E/e' c/w elevated LVEDP   Cough    DM type 2 (diabetes mellitus, type 2) (HCC)    GERD (gastroesophageal reflux disease)    History of diverticulitis of colon    HTN (hypertension)    Hyperlipidemia    IBS (irritable bowel syndrome)    Iron deficiency anemia    Mitral stenosis 04/17/2022   Echo 04/2022: EF 55-60, no RWMA, mild LVH, normal RVSF, normal PASP, moderate LAE, moderate mitral stenosis (mean gradient 4 mmHg), trivial MR, trivial AI, AV sclerosis without stenosis   Osteoporosis, unspecified    Renal insufficiency 07/31/2017   UTI (urinary tract infection)    Patient Active Problem List   Diagnosis Date Noted   Acute bronchitis 05/03/2023   Acute bacterial rhinosinusitis 05/03/2023   CKD (chronic kidney disease) stage 4, GFR 15-29 ml/min (HCC) 06/27/2022   Mitral stenosis 04/17/2022   Acute kidney injury superimposed on chronic kidney disease (HCC) 01/02/2022   NSTEMI (non-ST elevated myocardial infarction) (HCC) 12/23/2021   Osteoarthritis of knee 03/30/2021   Bilateral hand pain 03/08/2021   Complete heart  block (HCC) 12/13/2020   Pacemaker - MDT 12/13/2020   DOE (dyspnea on exertion) 07/29/2020   Encntr for surgical aftcr following surgery on the circ sys 07/26/2020   Pain in right knee 03/17/2020   Chronic combined systolic (congestive) and diastolic (congestive) heart failure (HCC)    DKA (diabetic ketoacidosis) (HCC) 12/20/2019   Diverticulosis 12/10/2019   Age-related osteoporosis without current pathological fracture 10/03/2019   Chronic kidney disease, stage 3 unspecified (HCC) 10/03/2019   Long term (current) use of insulin (HCC) 10/03/2019   Hyperlipidemia, unspecified 10/03/2019   Shortness of breath 01/30/2018   Acute bronchiolitis 12/17/2017   Wheezing 10/29/2017   Vitamin D deficiency 07/31/2017   Renal insufficiency 07/31/2017   Knee pain 06/01/2017   Morbid obesity due to excess calories (HCC) 01/25/2017   Diabetes (HCC) 04/12/2016   Cough 01/20/2015   Atrial fibrillation (HCC) 05/28/2014   History of colonic polyps 05/28/2014   Vomiting 01/29/2014   CAD (coronary artery disease) 09/01/2013   Routine general medical examination at a health care facility 07/17/2011   Encounter for long-term (current) use of other medications 07/06/2011   Nonspecific (abnormal) findings on radiological and other examination of body structure 11/09/2009   IRRITABLE BOWEL SYNDROME 05/07/2009   CHEST PAIN 05/07/2009   ADNEXAL MASS, RIGHT 07/22/2008   HEMORRHOIDS, RECURRENT 01/27/2008   Diverticulitis 01/27/2008   OTH ABNORMAL FIND RAD EXAMINATION BREAST 01/27/2008   GERD 01/13/2008   UTI  09/28/2007   Dyslipidemia 05/27/2007   ANEMIA-IRON DEFICIENCY 05/27/2007   ANXIETY 05/27/2007   Essential hypertension 05/27/2007   Seasonal allergic rhinitis 05/27/2007   Cough variant asthma 05/27/2007   Osteoporosis 05/27/2007   DIVERTICULITIS, HX OF 05/27/2007   Past Surgical History:  Procedure Laterality Date   CARDIOVASCULAR STRESS TEST  02/25/04   CORONARY BALLOON ANGIOPLASTY N/A  12/28/2021   Procedure: CORONARY BALLOON ANGIOPLASTY;  Surgeon: Kathleene Hazel, MD;  Location: [facility] INVASIVE CV LAB;  Service: Cardiovascular;  Laterality: N/A;   CORONARY STENT INTERVENTION N/A 12/28/2021   Procedure: CORONARY STENT INTERVENTION;  Surgeon: Kathleene Hazel, MD;  Location: [facility] INVASIVE CV LAB;  Service: Cardiovascular;  Laterality: N/A;   ESOPHAGOGASTRODUODENOSCOPY  12/26/01   PACEMAKER IMPLANT N/A 08/02/2020   Procedure: PACEMAKER IMPLANT;  Surgeon: Duke Salvia, MD;  Location: [facility] INVASIVE CV LAB;  Service: Cardiovascular;  Laterality: N/A;   RIGHT OOPHORECTOMY  jan 2010   RIGHT/LEFT HEART CATH AND CORONARY ANGIOGRAPHY N/A 12/28/2021   Procedure: RIGHT/LEFT HEART CATH AND CORONARY ANGIOGRAPHY;  Surgeon: Kathleene Hazel, MD;  Location: [facility] INVASIVE CV LAB;  Service: Cardiovascular;  Laterality: N/A;   Current Outpatient Medications on File Prior to Visit  Medication Sig Dispense Refill   acetaminophen (TYLENOL) 325 MG tablet Take 650 mg by mouth every 6 (six) hours as needed for pain.     albuterol (PROAIR HFA) 108 (90 Base) MCG/ACT inhaler 2 puffs every 4 hours as needed only  if your can't catch your breath 18 g 11   albuterol (PROVENTIL) (2.5 MG/3ML) 0.083% nebulizer solution Take 3 mLs (2.5 mg total) by nebulization every 6 (six) hours as needed for wheezing or shortness of breath. 150 mL 1   Ascorbic Acid (VITAMIN C) 1000 MG tablet Take 1,000 mg by mouth daily.     atorvastatin (LIPITOR) 80 MG tablet TAKE 1 TABLET BY MOUTH EVERY DAY 90 tablet 3   BD VEO INSULIN SYRINGE U/F 31G X 15/64" 0.5 ML MISC USE AS INSTRUCTED 100 each 3   benzonatate (TESSALON) 200 MG capsule Take 1 capsule (200 mg total) by mouth 3 (three) times daily as needed. 30 capsule 1   Blood Glucose Monitoring Suppl (ONETOUCH VERIO FLEX SYSTEM) w/Device KIT USE AS DIRECTED 1 kit .   budesonide-formoterol (SYMBICORT) 160-4.5 MCG/ACT inhaler TAKE 2 PUFFS FIRST THING IN AM AND THEN ANOTHER 2  PUFFS ABOUT 12 HOURS LATER. 30.6 each 6   carvedilol (COREG) 3.125 MG tablet TAKE 1 TABLET BY MOUTH TWICE A DAY WITH A MEAL 180 tablet 0   cefUROXime (CEFTIN) 250 MG tablet Take 1 tablet (250 mg total) by mouth 2 (two) times daily with a meal for 5 days. 10 tablet 0   cetirizine (ZYRTEC ALLERGY) 10 MG tablet Take 1 tablet (10 mg total) by mouth daily as needed for allergies. 30 tablet 5   cholecalciferol (VITAMIN D3) 25 MCG (1000 UNIT) tablet Take 1,000 Units by mouth daily. Isn't taking regularly     clopidogrel (PLAVIX) 75 MG tablet TAKE 1 TABLET BY MOUTH DAILY WITH BREAKFAST. 90 tablet 2   diclofenac Sodium (VOLTAREN) 1 % GEL Apply 4 g topically 4 (four) times daily as needed (pain).     ELIQUIS 2.5 MG TABS tablet TAKE 1 TABLET BY MOUTH TWICE A DAY 60 tablet 5   famotidine (PEPCID) 20 MG tablet TAKE 1 TABLET BY MOUTH TWICE A DAY 180 tablet 1   FARXIGA 5 MG TABS tablet TAKE 1 TABLET BY MOUTH EVERY DAY  BEFORE BREAKFAST 30 tablet 3   fluticasone (FLONASE) 50 MCG/ACT nasal spray Place 2 sprays into both nostrils daily. (Patient taking differently: Place 2 sprays into both nostrils as needed.) 16 g 5   furosemide (LASIX) 40 MG tablet Take 1 tablet (40 mg total) by mouth 2 (two) times daily. 180 tablet 3   glucose blood (ONETOUCH VERIO) test strip 1 each by Other route 2 (two) times daily. And lancets 2/day 200 each 3   glucose blood test strip Check blood sugar twice a day 100 each 12   hydrALAZINE (APRESOLINE) 25 MG tablet Take 3 tablets (75 mg total) by mouth 3 (three) times daily. 810 tablet 3   insulin lispro (HUMALOG) 100 UNIT/ML injection Give 3 units with BREAKFAST, AND 18 units with SUPPER 60 mL 1   latanoprost (XALATAN) 0.005 % ophthalmic solution Place 1 drop into both eyes at bedtime.     montelukast (SINGULAIR) 10 MG tablet TAKE 1 TABLET BY MOUTH EVERYDAY AT BEDTIME 90 tablet 1   nitroGLYCERIN (NITROSTAT) 0.4 MG SL tablet Place 1 tablet (0.4 mg total) under the tongue every 5 (five)  minutes x 3 doses as needed for chest pain. 25 tablet 2   potassium chloride (KLOR-CON) 10 MEQ tablet TAKE 1 TABLET BY MOUTH EVERY DAY 90 tablet 1   promethazine-dextromethorphan (PROMETHAZINE-DM) 6.25-15 MG/5ML syrup Take 5 mLs by mouth 4 (four) times daily as needed for cough. 240 mL 0   sodium bicarbonate 650 MG tablet Take 650 mg by mouth 2 (two) times daily.     Syringe/Needle, Disp, (SYRINGE 3CC/25GX1") 25G X 1" 3 ML MISC Use to inject into the skin 2x a day 100 each 3   traMADol (ULTRAM) 50 MG tablet Take 1 tablet (50 mg total) by mouth every 8 (eight) hours as needed for up to 5 days. 15 tablet 0   traZODone (DESYREL) 50 MG tablet Take 0.5 tablets (25 mg total) by mouth at bedtime as needed for sleep. 15 tablet 0   vitamin B-12 (CYANOCOBALAMIN) 100 MCG tablet Take 100 mcg by mouth daily.     No current facility-administered medications on file prior to visit.    Allergies  Allergen Reactions   Aspirin Shortness Of Breath and Other (See Comments)    Caused asthma symptoms   Ramipril Cough   Social History   Occupational History   Occupation: retired  Tobacco Use   Smoking status: Former    Current packs/day: 0.00    Average packs/day: 0.3 packs/day for 5.0 years (1.5 ttl pk-yrs)    Types: Cigarettes    Start date: 10/02/1981    Quit date: 10/02/1986    Years since quitting: 36.8   Smokeless tobacco: Never  Vaping Use   Vaping status: Never Used  Substance and Sexual Activity   Alcohol use: No   Drug use: No   Sexual activity: Not on file   Family History  Problem Relation Age of Onset   Hypertension Mother    Stroke Mother    Other Father        poor circulation   Cancer Sister    Lung cancer Brother    Melanoma Brother    Diabetes Daughter    Colon cancer Neg Hx    Liver disease Neg Hx    Esophageal cancer Neg Hx    Immunization History  Administered Date(s) Administered   Fluad Quad(high Dose 65+) 06/12/2019, 10/08/2020, 09/28/2021, 07/20/2022   Fluad  Trivalent(High Dose 65+) 06/15/2023   Influenza Split  07/17/2011, 07/05/2012   Influenza Whole 07/22/2008   Influenza, High Dose Seasonal PF 07/08/2018, 06/12/2019   Influenza,inj,Quad PF,6+ Mos 07/18/2013, 06/16/2014   Influenza-Unspecified 07/17/2011, 07/05/2012, 07/18/2013, 06/16/2014, 07/08/2018   PFIZER(Purple Top)SARS-COV-2 Vaccination 05/05/2020, 06/04/2020   Pneumococcal Conjugate-13 04/12/2016   Pneumococcal Polysaccharide-23 10/02/2006   Td 08/02/1998     Review of Systems: Negative except as noted in the HPI.   Objective: There were no vitals filed for this visit.  [name] is a pleasant 87 y.o. female in NAD. AAO X 3.  Title   Diabetic Foot Exam - detailed Date & Time: 08/08/2023  8:45 AM Diabetic Foot exam was performed with the following findings: Yes  Visual Foot Exam completed.: Yes  Is there a history of foot ulcer?: No Is there a foot ulcer now?: No Is there swelling?: No Is there elevated skin temperature?: No Is there abnormal foot shape?: Yes Is there a claw toe deformity?: Yes Are the toenails long?: Yes Are the toenails thick?: Yes Are the toenails ingrown?: No Is the skin thin, fragile, shiny and hairless?": Yes Normal Range of Motion?: No Is there foot or ankle muscle weakness?: No Do you have pain in calf while walking?: No Are the shoes appropriate in style and fit?: Yes Can the patient see the bottom of their feet?: No Pulse Foot Exam completed.: Yes   Right Posterior Tibialis: Present Left posterior Tibialis: Present   Right Dorsalis Pedis: Absent Left Dorsalis Pedis: Diminished     Sensory Foot Exam Completed.: Yes Semmes-Weinstein Monofilament Test "+" means "has sensation" and "-" means "no sensation"  R Foot Test Control: Pos L Foot Test Control: Pos   R Site 1-Great Toe: Pos L Site 1-Great Toe: Pos   R Site 4: Pos L Site 4: Pos   R site 5: Pos L Site 5: Pos  R Site 6: Pos L Site 6: Pos     Image components are not supported.    Image components are not supported. Image components are not supported.  Tuning [place] Right vibratory: present Left vibratory: present  Comments Dermatological: Healing abrasion dorsal DIPJ left 3rd toe. No edema, no erythema, no ischemia. No interdigital macerations noted b/l LE. Toenails 1-5 b/l elongated, discolored, dystrophic, thickened, crumbly with subungual debris and tenderness to dorsal palpation. Hyperkeratotic lesion(s) sub 5th met base left foot.  No erythema, no edema, no drainage, no fluctuance.  Musculoskeletal: Muscle strength 5/5 to all lower extremity muscle groups bilaterally. HAV with bunion bilaterally and hammertoes 2-5 b/l. Tailor's bunion deformity noted b/l LE.      Lab Results  Component Value Date   HGBA1C 9.9 (A) 05/09/2023   ADA Risk Categorization: High Risk  Patient has one or more of the following: Loss of protective sensation Absent pedal pulses Severe Foot deformity History of foot ulcer  Assessment: 1. Pain due to onychomycosis of toenails of both feet   2. Callus   3. Acquired hammertoes of both feet   4. Hallux valgus, acquired, bilateral   5. Type II diabetes mellitus with peripheral circulatory disorder (HCC)   6. Encounter for diabetic foot exam [facility])     Plan: -Patient was evaluated today. All questions/concerns addressed on today's visit. -Patient's family member present. All questions/concerns addressed on today's visit. -Patient instructed to apply Neosporin to left 3rd toe once daily for one week. -Diabetic foot examination performed today. -Continue diabetic foot care principles: inspect feet daily, monitor glucose as recommended by PCP and/or Endocrinologist, and follow prescribed  diet per PCP, Endocrinologist and/or dietician. -Patient to continue soft, supportive shoe gear daily. -Toenails 1-5 b/l were debrided in length and girth with sterile nail nippers and dremel without iatrogenic bleeding.  -Callus(es) sub 5th met base  left foot pared utilizing sterile scalpel blade without complication or incident. Total number debrided =1. -Shoe recommendations given for Oofos, Skechers for daily wear and Aerosoles with stretchable uppers for church. She related understanding. -Patient/POA to call should there be question/concern in the interim. Return in about 3 months (around 11/08/2023).  Freddie Breech, DPM

## 2023-08-09 ENCOUNTER — Ambulatory Visit: Payer: 59 | Admitting: "Endocrinology

## 2023-08-10 ENCOUNTER — Encounter: Payer: Self-pay | Admitting: Podiatry

## 2023-08-15 ENCOUNTER — Other Ambulatory Visit: Payer: Self-pay | Admitting: Internal Medicine

## 2023-08-15 DIAGNOSIS — M25562 Pain in left knee: Secondary | ICD-10-CM | POA: Diagnosis not present

## 2023-08-15 DIAGNOSIS — M1711 Unilateral primary osteoarthritis, right knee: Secondary | ICD-10-CM | POA: Diagnosis not present

## 2023-08-15 DIAGNOSIS — J209 Acute bronchitis, unspecified: Secondary | ICD-10-CM

## 2023-08-15 DIAGNOSIS — M25561 Pain in right knee: Secondary | ICD-10-CM | POA: Diagnosis not present

## 2023-08-15 DIAGNOSIS — B9689 Other specified bacterial agents as the cause of diseases classified elsewhere: Secondary | ICD-10-CM

## 2023-08-17 NOTE — Progress Notes (Signed)
Remote pacemaker transmission.   

## 2023-08-23 DIAGNOSIS — I5032 Chronic diastolic (congestive) heart failure: Secondary | ICD-10-CM | POA: Diagnosis not present

## 2023-08-23 DIAGNOSIS — E1122 Type 2 diabetes mellitus with diabetic chronic kidney disease: Secondary | ICD-10-CM | POA: Diagnosis not present

## 2023-08-23 DIAGNOSIS — E875 Hyperkalemia: Secondary | ICD-10-CM | POA: Diagnosis not present

## 2023-08-23 DIAGNOSIS — D509 Iron deficiency anemia, unspecified: Secondary | ICD-10-CM | POA: Diagnosis not present

## 2023-08-23 DIAGNOSIS — N184 Chronic kidney disease, stage 4 (severe): Secondary | ICD-10-CM | POA: Diagnosis not present

## 2023-08-23 DIAGNOSIS — E872 Acidosis, unspecified: Secondary | ICD-10-CM | POA: Diagnosis not present

## 2023-08-23 DIAGNOSIS — N2581 Secondary hyperparathyroidism of renal origin: Secondary | ICD-10-CM | POA: Diagnosis not present

## 2023-08-23 DIAGNOSIS — I129 Hypertensive chronic kidney disease with stage 1 through stage 4 chronic kidney disease, or unspecified chronic kidney disease: Secondary | ICD-10-CM | POA: Diagnosis not present

## 2023-08-28 ENCOUNTER — Telehealth (HOSPITAL_BASED_OUTPATIENT_CLINIC_OR_DEPARTMENT_OTHER): Payer: Self-pay | Admitting: Family

## 2023-08-28 NOTE — Progress Notes (Signed)
Tried calling the pt and there was no answer and no opt to leave msg

## 2023-08-31 ENCOUNTER — Emergency Department (HOSPITAL_COMMUNITY)
Admission: EM | Admit: 2023-08-31 | Discharge: 2023-08-31 | Disposition: A | Discharge status: Home / Self Care | Payer: 59 | Attending: Emergency Medicine | Admitting: Emergency Medicine

## 2023-08-31 ENCOUNTER — Emergency Department (HOSPITAL_COMMUNITY): Payer: 59

## 2023-08-31 ENCOUNTER — Other Ambulatory Visit: Payer: Self-pay

## 2023-08-31 DIAGNOSIS — M5412 Radiculopathy, cervical region: Secondary | ICD-10-CM | POA: Insufficient documentation

## 2023-08-31 DIAGNOSIS — R202 Paresthesia of skin: Secondary | ICD-10-CM | POA: Diagnosis present

## 2023-08-31 DIAGNOSIS — I10 Essential (primary) hypertension: Secondary | ICD-10-CM | POA: Diagnosis not present

## 2023-08-31 DIAGNOSIS — Z7901 Long term (current) use of anticoagulants: Secondary | ICD-10-CM | POA: Diagnosis not present

## 2023-08-31 DIAGNOSIS — I672 Cerebral atherosclerosis: Secondary | ICD-10-CM | POA: Diagnosis not present

## 2023-08-31 DIAGNOSIS — R9389 Abnormal findings on diagnostic imaging of other specified body structures: Secondary | ICD-10-CM | POA: Insufficient documentation

## 2023-08-31 DIAGNOSIS — Z7984 Long term (current) use of oral hypoglycemic drugs: Secondary | ICD-10-CM | POA: Insufficient documentation

## 2023-08-31 DIAGNOSIS — Z79899 Other long term (current) drug therapy: Secondary | ICD-10-CM | POA: Diagnosis not present

## 2023-08-31 DIAGNOSIS — I2510 Atherosclerotic heart disease of native coronary artery without angina pectoris: Secondary | ICD-10-CM | POA: Diagnosis not present

## 2023-08-31 DIAGNOSIS — M9931 Osseous stenosis of neural canal of cervical region: Secondary | ICD-10-CM | POA: Diagnosis not present

## 2023-08-31 DIAGNOSIS — I6523 Occlusion and stenosis of bilateral carotid arteries: Secondary | ICD-10-CM | POA: Diagnosis not present

## 2023-08-31 DIAGNOSIS — Z794 Long term (current) use of insulin: Secondary | ICD-10-CM | POA: Insufficient documentation

## 2023-08-31 DIAGNOSIS — M4722 Other spondylosis with radiculopathy, cervical region: Secondary | ICD-10-CM | POA: Diagnosis not present

## 2023-08-31 DIAGNOSIS — I4891 Unspecified atrial fibrillation: Secondary | ICD-10-CM | POA: Insufficient documentation

## 2023-08-31 DIAGNOSIS — E119 Type 2 diabetes mellitus without complications: Secondary | ICD-10-CM | POA: Diagnosis not present

## 2023-08-31 LAB — CBC WITH DIFFERENTIAL/PLATELET
Abs Immature Granulocytes: 0.13 10*3/uL — ABNORMAL HIGH (ref 0.00–0.07)
Basophils Absolute: 0 10*3/uL (ref 0.0–0.1)
Basophils Relative: 0 %
Eosinophils Absolute: 0.1 10*3/uL (ref 0.0–0.5)
Eosinophils Relative: 1 %
HCT: 41.5 % (ref 36.0–46.0)
Hemoglobin: 14.8 g/dL (ref 12.0–15.0)
Immature Granulocytes: 1 %
Lymphocytes Relative: 7 %
Lymphs Abs: 0.7 10*3/uL (ref 0.7–4.0)
MCH: 29.4 pg (ref 26.0–34.0)
MCHC: 35.7 g/dL (ref 30.0–36.0)
MCV: 82.3 fL (ref 80.0–100.0)
Monocytes Absolute: 0.8 10*3/uL (ref 0.1–1.0)
Monocytes Relative: 9 %
Neutro Abs: 7.3 10*3/uL (ref 1.7–7.7)
Neutrophils Relative %: 82 %
Platelets: 139 10*3/uL — ABNORMAL LOW (ref 150–400)
RBC: 5.04 MIL/uL (ref 3.87–5.11)
RDW: 18.4 % — ABNORMAL HIGH (ref 11.5–15.5)
WBC: 9.1 10*3/uL (ref 4.0–10.5)
nRBC: 0.9 % — ABNORMAL HIGH (ref 0.0–0.2)

## 2023-08-31 LAB — COMPREHENSIVE METABOLIC PANEL
ALT: 27 U/L (ref 0–44)
AST: 20 U/L (ref 15–41)
Albumin: 3.8 g/dL (ref 3.5–5.0)
Alkaline Phosphatase: 88 U/L (ref 38–126)
Anion gap: 13 (ref 5–15)
BUN: 63 mg/dL — ABNORMAL HIGH (ref 8–23)
CO2: 17 mmol/L — ABNORMAL LOW (ref 22–32)
Calcium: 9.5 mg/dL (ref 8.9–10.3)
Chloride: 104 mmol/L (ref 98–111)
Creatinine, Ser: 2.61 mg/dL — ABNORMAL HIGH (ref 0.44–1.00)
GFR, Estimated: 17 mL/min — ABNORMAL LOW (ref >60)
Glucose, Bld: 348 mg/dL — ABNORMAL HIGH (ref 70–99)
Potassium: 4.2 mmol/L (ref 3.5–5.1)
Sodium: 134 mmol/L — ABNORMAL LOW (ref 135–145)
Total Bilirubin: 1.1 mg/dL (ref <1.2)
Total Protein: 6.7 g/dL (ref 6.5–8.1)

## 2023-08-31 LAB — TROPONIN I (HIGH SENSITIVITY)
Troponin I (High Sensitivity): 21 ng/L — ABNORMAL HIGH (ref <18)
Troponin I (High Sensitivity): 28 ng/L — ABNORMAL HIGH (ref <18)

## 2023-08-31 NOTE — ED Provider Notes (Incomplete)
[place] [facility] AT [facility] Provider Note   CSN: [id number] Arrival date & time: 08/31/23  1836     History {Add pertinent medical, surgical, social history, OB history to HPI:1} Chief Complaint  Patient presents with   Numbness    Let arm    [name] is a 87 y.o. female.  HPI     Home Medications Prior to Admission medications   Medication Sig Start Date End Date Taking? Authorizing Provider  acetaminophen (TYLENOL) 325 MG tablet Take 650 mg by mouth every 6 (six) hours as needed for pain.    [provider]  albuterol (PROAIR HFA) 108 (90 Base) MCG/ACT inhaler 2 puffs every 4 hours as needed only  if your can't catch your breath 04/20/23   Olive Bass, FNP  albuterol (PROVENTIL) (2.5 MG/3ML) 0.083% nebulizer solution Take 3 mLs (2.5 mg total) by nebulization every 6 (six) hours as needed for wheezing or shortness of breath. 03/06/23   Olive Bass, FNP  Ascorbic Acid (VITAMIN C) 1000 MG tablet Take 1,000 mg by mouth daily.    [provider]  atorvastatin (LIPITOR) 80 MG tablet TAKE 1 TABLET BY MOUTH EVERY DAY 07/27/23   Pricilla Riffle, MD  BD VEO INSULIN SYRINGE U/F 31G X 15/64" 0.5 ML MISC USE AS INSTRUCTED 04/10/23   Reather Littler, MD  benzonatate (TESSALON) 200 MG capsule Take 1 capsule (200 mg total) by mouth 3 (three) times daily as needed. 05/03/23   Cobb, Ruby Cola, NP  Blood Glucose Monitoring Suppl (ONETOUCH VERIO FLEX SYSTEM) w/Device KIT USE AS DIRECTED 01/16/22   Romero Belling, MD  budesonide-formoterol [facility]) 160-4.5 MCG/ACT inhaler TAKE 2 PUFFS FIRST THING IN AM AND THEN ANOTHER 2 PUFFS ABOUT 12 HOURS LATER. 10/06/22   Nyoka Cowden, MD  carvedilol (COREG) 3.125 MG tablet TAKE 1 TABLET BY MOUTH TWICE A DAY WITH A MEAL 07/13/23   Pricilla Riffle, MD  cetirizine (ZYRTEC ALLERGY) 10 MG tablet Take 1 tablet (10 mg total) by mouth daily as needed for allergies. 05/30/23   Birder Robson, MD   cholecalciferol (VITAMIN D3) 25 MCG (1000 UNIT) tablet Take 1,000 Units by mouth daily. Isn't taking regularly    [provider]  clopidogrel (PLAVIX) 75 MG tablet TAKE 1 TABLET BY MOUTH DAILY WITH BREAKFAST. 01/05/23   Dyann Kief, PA-C  diclofenac Sodium (VOLTAREN) 1 % GEL Apply 4 g topically 4 (four) times daily as needed (pain). 01/27/21   [provider]  ELIQUIS 2.5 MG TABS tablet TAKE 1 TABLET BY MOUTH TWICE A DAY 06/27/23   Pricilla Riffle, MD  famotidine (PEPCID) 20 MG tablet TAKE 1 TABLET BY MOUTH TWICE A DAY 06/19/23   Olive Bass, FNP  FARXIGA 5 MG TABS tablet TAKE 1 TABLET BY MOUTH EVERY DAY BEFORE BREAKFAST 02/28/23   Motwani, Carin Hock, MD  fluticasone (FLONASE) 50 MCG/ACT nasal spray Place 2 sprays into both nostrils daily. Patient taking differently: Place 2 sprays into both nostrils as needed. 05/30/23   Birder Robson, MD  furosemide (LASIX) 40 MG tablet Take 1 tablet (40 mg total) by mouth 2 (two) times daily. 10/25/22   Pricilla Riffle, MD  glucose blood [facility] VERIO) test strip 1 each by Other route 2 (two) times daily. And lancets 2/day 05/09/23   Altamese [place], MD  glucose blood test strip Check blood sugar twice a day 04/11/21   Romero Belling, MD  hydrALAZINE (APRESOLINE) 25 MG  tablet Take 3 tablets (75 mg total) by mouth 3 (three) times daily. 07/30/23   Pricilla Riffle, MD  insulin lispro (HUMALOG) 100 UNIT/ML injection Give 3 units with BREAKFAST, AND 18 units with SUPPER 04/11/23   Motwani, Komal, MD  latanoprost (XALATAN) 0.005 % ophthalmic solution Place 1 drop into both eyes at bedtime. 03/16/21   [provider]  montelukast (SINGULAIR) 10 MG tablet TAKE 1 TABLET BY MOUTH EVERYDAY AT BEDTIME 07/13/23   Nyoka Cowden, MD  nitroGLYCERIN (NITROSTAT) 0.4 MG SL tablet Place 1 tablet (0.4 mg total) under the tongue every 5 (five) minutes x 3 doses as needed for chest pain. 07/24/23   Pricilla Riffle, MD  ondansetron (ZOFRAN) 8 MG tablet Take 1  tablet (8 mg total) by mouth every 8 (eight) hours as needed for nausea or vomiting. 08/08/23   Imogene Burn, MD  pantoprazole (PROTONIX) 40 MG tablet Take 1 tablet (40 mg total) by mouth daily. 08/08/23   Imogene Burn, MD  potassium chloride (KLOR-CON) 10 MEQ tablet TAKE 1 TABLET BY MOUTH EVERY DAY 06/27/23   Pricilla Riffle, MD  promethazine-dextromethorphan (PROMETHAZINE-DM) 6.25-15 MG/5ML syrup TAKE 5 MLS BY MOUTH 4 TIMES A DAY AS NEEDED FOR COUGH 08/16/23   Nyoka Cowden, MD  sodium bicarbonate 650 MG tablet Take 650 mg by mouth 2 (two) times daily. 05/19/22   [provider]  Syringe/Needle, Disp, (SYRINGE 3CC/25GX1") 25G X 1" 3 ML MISC Use to inject into the skin 2x a day 04/11/21   Romero Belling, MD  traZODone (DESYREL) 50 MG tablet Take 0.5 tablets (25 mg total) by mouth at bedtime as needed for sleep. 07/24/23   Pricilla Riffle, MD  vitamin B-12 (CYANOCOBALAMIN) 100 MCG tablet Take 100 mcg by mouth daily.    [provider]      Allergies    Aspirin and Ramipril    Review of Systems   Review of Systems  Physical Exam Updated Vital Signs BP (!) 142/76   Pulse 60   Temp 97.9 F (36.6 C) (Oral)   Resp (!) 25   Ht 5\' 6"  (1.676 m)   Wt 79.4 kg   SpO2 98%   BMI 28.25 kg/m  Physical Exam  ED Results / Procedures / Treatments   Labs (all labs ordered are listed, but only abnormal results are displayed) Labs Reviewed  CBC WITH DIFFERENTIAL/PLATELET - Abnormal; Notable for the following components:      Result Value   RDW 18.4 (*)    Platelets 139 (*)    nRBC 0.9 (*)    Abs Immature Granulocytes 0.13 (*)    All other components within normal limits  COMPREHENSIVE METABOLIC PANEL - Abnormal; Notable for the following components:   Sodium 134 (*)    CO2 17 (*)    Glucose, Bld 348 (*)    BUN 63 (*)    Creatinine, Ser 2.61 (*)    GFR, Estimated 17 (*)    All other components within normal limits  TROPONIN I (HIGH SENSITIVITY) - Abnormal; Notable for the  following components:   Troponin I (High Sensitivity) 21 (*)    All other components within normal limits  TROPONIN I (HIGH SENSITIVITY) - Abnormal; Notable for the following components:   Troponin I (High Sensitivity) 28 (*)    All other components within normal limits    EKG None  Radiology CT Cervical Spine Wo Contrast  Result Date: 08/31/2023 CLINICAL DATA:  Cervical radiculopathy,  no red flags EXAM: CT CERVICAL SPINE WITHOUT CONTRAST TECHNIQUE: Multidetector CT imaging of the cervical spine was performed without intravenous contrast. Multiplanar CT image reconstructions were also generated. RADIATION DOSE REDUCTION: This exam was performed according to the departmental dose-optimization program which includes automated exposure control, adjustment of the mA and/or kV according to patient size and/or use of iterative reconstruction technique. COMPARISON:  X-ray cervical spine 07/12/2007 FINDINGS: Alignment: Normal. Skull base and vertebrae: Multilevel severe degenerative changes spine. Osseous fusion of the C4-C5 and C6-C7 vertebral bodies. No severe osseous neural foraminal stenosis. At least moderate osseous central canal stenosis at the C3-C4 level in the setting of posterior disc osteophyte complex formation. No acute fracture. No aggressive appearing focal osseous lesion or focal pathologic process. Soft tissues and spinal canal: No prevertebral fluid or swelling. No visible canal hematoma. Upper chest: Unremarkable. Other: Atherosclerotic calcifications are present within the cavernous internal carotid arteries. Left submandibular gland calcifications. IMPRESSION: 1. No acute displaced fracture or traumatic listhesis of the cervical spine. 2. Please see separately dictated CT head 08/31/2023. 3. Left submandibular gland calcifications. Electronically Signed   By: Tish Frederickson M.D.   On: 08/31/2023 22:05   CT Head Wo Contrast  Result Date: 08/31/2023 CLINICAL DATA:  TIA. EXAM: CT HEAD  WITHOUT CONTRAST TECHNIQUE: Contiguous axial images were obtained from the base of the skull through the vertex without intravenous contrast. RADIATION DOSE REDUCTION: This exam was performed according to the departmental dose-optimization program which includes automated exposure control, adjustment of the mA and/or kV according to patient size and/or use of iterative reconstruction technique. COMPARISON:  None Available. FINDINGS: Brain: No evidence of acute infarction, hemorrhage, hydrocephalus, extra-axial collection or mass lesion/mass effect. There small old cortical infarcts in the bilateral frontoparietal regions. Vascular: Atherosclerotic calcifications are present within the cavernous internal carotid arteries. Skull: Normal. Negative for fracture or focal lesion. Sinuses/Orbits: No acute finding. Other: None. IMPRESSION: 1. No acute intracranial process. 2. 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 Electronically Signed   By: Darliss Cheney M.D.   On: 08/31/2023 22:05    Procedures Procedures  {Document cardiac monitor, telemetry assessment procedure when appropriate:1}  Medications Ordered in ED Medications - No data to display  ED Course/ Medical Decision Making/ A&P Clinical Course as of 08/31/23 2229  [facility] Aug 31, 2023  2153 Stable Left arm numbness  shoulder to risk No injury Hx of CAD. 1st trop at baseline. 2nd pending. [CC]    Clinical Course User Index [CC] Glyn Ade, MD   {   Click here for ABCD2, HEART and other calculatorsREFRESH Note before signing :1}                              Medical Decision Making Amount and/or Complexity of Data Reviewed Radiology: ordered.   ***  {Document critical care time when appropriate:1} {Document review of labs and clinical decision tools ie heart score, Chads2Vasc2 etc:1}  {Document your independent review of radiology images, and any outside records:1} {Document your discussion with family  members, caretakers, and with consultants:1} {Document social determinants of health affecting pt's care:1} {Document your decision making why or why not admission, treatments were needed:1} Final Clinical Impression(s) / ED Diagnoses Final diagnoses:  None    Rx / DC Orders ED Discharge Orders     None

## 2023-08-31 NOTE — ED Triage Notes (Signed)
Pt with left arm numbness and tingling.LSW wed. Denies chest pain sob.no weakness. No other neuro symptoms. Pt has history of MI.

## 2023-08-31 NOTE — ED Notes (Signed)
Patient transported to CT 

## 2023-08-31 NOTE — ED Provider Triage Note (Signed)
Emergency Medicine Provider Triage Evaluation Note  [name] , a 87 y.o. female  was evaluated in triage.  Pt complains of left arm tingling and pain since Wednesday.  Patient states pain is intermittent.  She is worried about heart attack.  Denies weakness, chest pain, shortness of breath.   Pain is not worse with movement.  Denies injury.   Review of Systems  Positive: As above Negative: As above  Physical Exam  There were no vitals taken for this visit. Gen:   Awake, no distress   Resp:  Normal effort  MSK:   Moves extremities without difficulty  Other:    Medical Decision Making  Medically screening exam initiated at 7:20 PM.  Appropriate orders placed.  [name] was informed that the remainder of the evaluation will be completed by another provider, this initial triage assessment does not replace that evaluation, and the importance of remaining in the ED until their evaluation is complete.     [name], [place] 08/31/23 1921

## 2023-08-31 NOTE — Discharge Instructions (Addendum)
You were seen here today for evaluation of your left arm tingling. 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 I recommend Tylenol for this. Please make sure that you are compliant with your medications as your sugar is elevated. I also recommend following back with your cardiologist and your PCP. If you have any concerns, new or worsening symptoms, please return to the nearest ER for re-evaluation.   Your imaging shows that you have had very small older strokes in the past. Additionally shows some calcifications in your submandibular gland. Please make sure you follow up with your PCP regarding these findings.   Contact a health care provider if: Your condition does not improve with treatment. Get help right away if: Your pain gets much worse and is not controlled with medicines. You have weakness or numbness in your hand, arm, face, or leg. You have a high fever. You have a stiff, rigid neck. You lose control of your bowels or your bladder (have incontinence). You have trouble with walking, balance, or speaking.

## 2023-09-03 NOTE — Progress Notes (Unsigned)
Cardiology Office Note    Patient Name: [name] Date of Encounter: 09/03/2023  Primary Care Provider:  Olive Bass, FNP Primary Cardiologist:  Dietrich Pates, MD Primary Electrophysiologist: None   Past Medical History    Past Medical History:  Diagnosis Date   Allergic rhinitis    Anterior chest wall pain    Anxiety    Asthma    Chronic diastolic CHF (congestive heart failure) (HCC)    Echo 01/2020: EF 60-65, normal wall motion, mild LVH, normal RV SF, RVSP 52.6 (moderate elevation), severe LAE, moderate RAE, trivial MR, mild MS (mean gradient 5.5 mmHg), mild aortic valve sclerosis (no AS); elevated E/e' c/w elevated LVEDP   Cough    DM type 2 (diabetes mellitus, type 2) (HCC)    GERD (gastroesophageal reflux disease)    History of diverticulitis of colon    HTN (hypertension)    Hyperlipidemia    IBS (irritable bowel syndrome)    Iron deficiency anemia    Mitral stenosis 04/17/2022   Echo 04/2022: EF 55-60, no RWMA, mild LVH, normal RVSF, normal PASP, moderate LAE, moderate mitral stenosis (mean gradient 4 mmHg), trivial MR, trivial AI, AV sclerosis without stenosis   Osteoporosis, unspecified    Renal insufficiency 07/31/2017   UTI (urinary tract infection)     History of Present Illness  Sheryl SYPNIEWSKI is a 87 y.o. female with a PMH of CAD s/p NSTEMI 11/2021 with PTCA/DES to LAD, permanent AF, DM type II, HTN, HLD, chronic combined CHF, moderate mitral stenosis, lung nodules, CKD stage IV, CHB s/p PPM 2021, COPD who presents today for post ED follow-up.  [name] has been followed by Dr. Tenny Craw since 2012 for management of CAD.  She was diagnosed with AF in 2015 and started on Eliquis 5 mg.  She had complaint of SOB in 2017 and underwent stress test that was abnormal.  She then underwent coronary CT that revealed moderate CAD of the LAD with FFR showing no flow-limiting lesions.  She was admitted on 07/2020 with complaint of shortness of breath and bradycardia.  BNP was  elevated at 371 and 2D echo was completed showing stable EF with DD and severe PAH.  EKG was completed showing heart rate in the low 40s with variations in RR interval and chronotropic response indicating PPM.  She underwent placement by Dr. Graciela Husbands on 08/2020.  She was admitted to the ED on 12/23/2021 with complaint of chest pain and found to have NSTEMI.  She underwent LHC revealing 90% stenosed first diagonal and 95% stenosed LAD to mid LAD treated with PCI and DES x 1 and balloon angioplasty to first diagonal.  She was last seen by Dr. Tenny Craw on 07/2023 for follow-up visit.  During visit patient was doing well with no complaints of chest pain and breathing was stable with no tachycardia present.  2D echo has been completed 04/2023 showing moderate MS with mean gradient of 4.  She was seen by her pulmonologist on 08/01/2023 and complained of some chest tightness with dyspnea.  She was seen in the ED on 08/31/2023 with complaint of left arm numbness and tingling.  She denied any weakness or chest pain at that time.   During today's visit the patient reports*** .  Patient denies chest pain, palpitations, dyspnea, PND, orthopnea, nausea, vomiting, dizziness, syncope, edema, weight gain, or early satiety.  ***Notes: -Last ischemic evaluation: -Last echo: -Interim ED visits: Review of Systems  Please see the history of present illness.  All other systems reviewed and are otherwise negative except as noted above.  Physical Exam    Wt Readings from Last 3 Encounters:  08/31/23 175 lb (79.4 kg)  08/08/23 173 lb (78.5 kg)  08/07/23 173 lb 3.2 oz (78.6 kg)   QI:HKVQQ were no vitals filed for this visit.,There is no height or weight on file to calculate BMI. GEN: Well nourished, well developed in no acute distress Neck: No JVD; No carotid bruits Pulmonary: Clear to auscultation without rales, wheezing or rhonchi  Cardiovascular: Normal rate. Regular rhythm. Normal S1. Normal S2.   Murmurs: There is no  murmur.  ABDOMEN: Soft, non-tender, non-distended EXTREMITIES:  No edema; No deformity   EKG/LABS/ Recent Cardiac Studies   ECG personally reviewed by me today - ***  Risk Assessment/Calculations:   {Does this patient have ATRIAL FIBRILLATION?:[id number]}      Lab Results  Component Value Date   WBC 9.1 08/31/2023   HGB 14.8 08/31/2023   HCT 41.5 08/31/2023   MCV 82.3 08/31/2023   PLT 139 (L) 08/31/2023   Lab Results  Component Value Date   CREATININE 2.61 (H) 08/31/2023   BUN 63 (H) 08/31/2023   NA 134 (L) 08/31/2023   K 4.2 08/31/2023   CL 104 08/31/2023   CO2 17 (L) 08/31/2023   Lab Results  Component Value Date   CHOL 105 02/05/2023   HDL 47.00 02/05/2023   LDLCALC 41 02/05/2023   TRIG 89.0 02/05/2023   CHOLHDL 2 02/05/2023    Lab Results  Component Value Date   HGBA1C 9.9 (A) 05/09/2023   Assessment & Plan    1.  Coronary artery disease:  2.  Permanent A-fib:  3.  History of CHB:  4.  Chronic HFpEF:  5.  Nonrheumatic MS:  6.  CKD stage IV:      Disposition: Follow-up with Dietrich Pates, MD or APP in *** months {Are you ordering a CV Procedure (e.g. stress test, cath, DCCV, TEE, etc)?   Press F2        :[id number]}   Signed, Napoleon Form, Leodis Rains, NP 09/03/2023, 12:16 PM [facility]

## 2023-09-04 ENCOUNTER — Ambulatory Visit: Payer: 59 | Admitting: Nurse Practitioner

## 2023-09-04 ENCOUNTER — Ambulatory Visit
Admission: RE | Admit: 2023-09-04 | Discharge: 2023-09-04 | Disposition: A | Discharge status: Home / Self Care | Payer: 59 | Source: Ambulatory Visit | Attending: Family | Admitting: Family

## 2023-09-04 ENCOUNTER — Ambulatory Visit (INDEPENDENT_AMBULATORY_CARE_PROVIDER_SITE_OTHER): Payer: 59 | Admitting: Family

## 2023-09-04 ENCOUNTER — Ambulatory Visit: Payer: 59 | Admitting: Family

## 2023-09-04 ENCOUNTER — Encounter: Payer: Self-pay | Admitting: Family

## 2023-09-04 VITALS — BP 144/90 | HR 75 | Ht 66.0 in | Wt 170.2 lb

## 2023-09-04 DIAGNOSIS — R591 Generalized enlarged lymph nodes: Secondary | ICD-10-CM | POA: Diagnosis not present

## 2023-09-04 DIAGNOSIS — Z780 Asymptomatic menopausal state: Secondary | ICD-10-CM | POA: Insufficient documentation

## 2023-09-04 DIAGNOSIS — N184 Chronic kidney disease, stage 4 (severe): Secondary | ICD-10-CM | POA: Diagnosis not present

## 2023-09-04 DIAGNOSIS — I5032 Chronic diastolic (congestive) heart failure: Secondary | ICD-10-CM | POA: Insufficient documentation

## 2023-09-04 DIAGNOSIS — I4821 Permanent atrial fibrillation: Secondary | ICD-10-CM

## 2023-09-04 DIAGNOSIS — I2510 Atherosclerotic heart disease of native coronary artery without angina pectoris: Secondary | ICD-10-CM | POA: Diagnosis not present

## 2023-09-04 DIAGNOSIS — M810 Age-related osteoporosis without current pathological fracture: Secondary | ICD-10-CM | POA: Diagnosis not present

## 2023-09-04 DIAGNOSIS — I4891 Unspecified atrial fibrillation: Secondary | ICD-10-CM

## 2023-09-04 DIAGNOSIS — J209 Acute bronchitis, unspecified: Secondary | ICD-10-CM | POA: Diagnosis not present

## 2023-09-04 MED ORDER — PREDNISONE 20 MG PO TABS: 20.0000 mg | ORAL_TABLET | Freq: Every day | ORAL | 0 refills | Status: DC

## 2023-09-04 MED ORDER — DOXYCYCLINE HYCLATE 100 MG PO TABS: 100.0000 mg | ORAL_TABLET | Freq: Two times a day (BID) | ORAL | 0 refills | Status: DC

## 2023-09-04 NOTE — Progress Notes (Signed)
[name] is a 87 y.o. female with the following history as recorded in Epic[employer]:  Patient Active Problem List   Diagnosis Date Noted   Acute bronchitis 05/03/2023   Acute bacterial rhinosinusitis 05/03/2023   CKD (chronic kidney disease) stage 4, GFR 15-29 ml/min (HCC) 06/27/2022   Mitral stenosis 04/17/2022   Acute kidney injury superimposed on chronic kidney disease (HCC) 01/02/2022   NSTEMI (non-ST elevated myocardial infarction) (HCC) 12/23/2021   Osteoarthritis of knee 03/30/2021   Bilateral hand pain 03/08/2021   Complete heart block (HCC) 12/13/2020   Pacemaker - MDT 12/13/2020   DOE (dyspnea on exertion) 07/29/2020   Encntr for surgical aftcr following surgery on the circ sys 07/26/2020   Pain in right knee 03/17/2020   Chronic combined systolic (congestive) and diastolic (congestive) heart failure (HCC)    DKA (diabetic ketoacidosis) (HCC) 12/20/2019   Diverticulosis 12/10/2019   Age-related osteoporosis without current pathological fracture 10/03/2019   Chronic kidney disease, stage 3 unspecified (HCC) 10/03/2019   Long term (current) use of insulin (HCC) 10/03/2019   Hyperlipidemia, unspecified 10/03/2019   Shortness of breath 01/30/2018   Acute bronchiolitis 12/17/2017   Wheezing 10/29/2017   Vitamin D deficiency 07/31/2017   Renal insufficiency 07/31/2017   Knee pain 06/01/2017   Morbid obesity due to excess calories (HCC) 01/25/2017   Diabetes (HCC) 04/12/2016   Cough 01/20/2015   Atrial fibrillation (HCC) 05/28/2014   History of colonic polyps 05/28/2014   Vomiting 01/29/2014   CAD (coronary artery disease) 09/01/2013   Routine general medical examination at a health care facility 07/17/2011   Encounter for long-term (current) use of other medications 07/06/2011   Nonspecific (abnormal) findings on radiological and other examination of body structure 11/09/2009   IRRITABLE BOWEL SYNDROME 05/07/2009   CHEST PAIN 05/07/2009   ADNEXAL MASS, RIGHT 07/22/2008    HEMORRHOIDS, RECURRENT 01/27/2008   Diverticulitis 01/27/2008   OTH ABNORMAL FIND RAD EXAMINATION BREAST 01/27/2008   GERD 01/13/2008   UTI 09/28/2007   Dyslipidemia 05/27/2007   ANEMIA-IRON DEFICIENCY 05/27/2007   ANXIETY 05/27/2007   Essential hypertension 05/27/2007   Seasonal allergic rhinitis 05/27/2007   Cough variant asthma 05/27/2007   Osteoporosis 05/27/2007   DIVERTICULITIS, HX OF 05/27/2007    Current Outpatient Medications  Medication Sig Dispense Refill   acetaminophen (TYLENOL) 325 MG tablet Take 650 mg by mouth every 6 (six) hours as needed for pain.     albuterol (PROAIR HFA) 108 (90 Base) MCG/ACT inhaler 2 puffs every 4 hours as needed only  if your can't catch your breath 18 g 11   albuterol (PROVENTIL) (2.5 MG/3ML) 0.083% nebulizer solution Take 3 mLs (2.5 mg total) by nebulization every 6 (six) hours as needed for wheezing or shortness of breath. 150 mL 1   Ascorbic Acid (VITAMIN C) 1000 MG tablet Take 1,000 mg by mouth daily.     atorvastatin (LIPITOR) 80 MG tablet TAKE 1 TABLET BY MOUTH EVERY DAY 90 tablet 3   BD VEO INSULIN SYRINGE U/F 31G X 15/64" 0.5 ML MISC USE AS INSTRUCTED 100 each 3   benzonatate (TESSALON) 200 MG capsule Take 1 capsule (200 mg total) by mouth 3 (three) times daily as needed. 30 capsule 1   Blood Glucose Monitoring Suppl (ONETOUCH VERIO FLEX SYSTEM) w/Device KIT USE AS DIRECTED 1 kit .   budesonide-formoterol (SYMBICORT) 160-4.5 MCG/ACT inhaler TAKE 2 PUFFS FIRST THING IN AM AND THEN ANOTHER 2 PUFFS ABOUT 12 HOURS LATER. 30.6 each 6   carvedilol (COREG) 3.125  MG tablet TAKE 1 TABLET BY MOUTH TWICE A DAY WITH A MEAL 180 tablet 0   cetirizine (ZYRTEC ALLERGY) 10 MG tablet Take 1 tablet (10 mg total) by mouth daily as needed for allergies. 30 tablet 5   cholecalciferol (VITAMIN D3) 25 MCG (1000 UNIT) tablet Take 1,000 Units by mouth daily. Isn't taking regularly     clopidogrel (PLAVIX) 75 MG tablet TAKE 1 TABLET BY MOUTH DAILY WITH BREAKFAST.  90 tablet 2   diclofenac Sodium (VOLTAREN) 1 % GEL Apply 4 g topically 4 (four) times daily as needed (pain).     doxycycline (VIBRA-TABS) 100 MG tablet Take 1 tablet (100 mg total) by mouth 2 (two) times daily. 14 tablet 0   ELIQUIS 2.5 MG TABS tablet TAKE 1 TABLET BY MOUTH TWICE A DAY 60 tablet 5   famotidine (PEPCID) 20 MG tablet TAKE 1 TABLET BY MOUTH TWICE A DAY 180 tablet 1   FARXIGA 5 MG TABS tablet TAKE 1 TABLET BY MOUTH EVERY DAY BEFORE BREAKFAST 30 tablet 3   fluticasone (FLONASE) 50 MCG/ACT nasal spray Place 2 sprays into both nostrils daily. (Patient taking differently: Place 2 sprays into both nostrils as needed.) 16 g 5   furosemide (LASIX) 40 MG tablet Take 1 tablet (40 mg total) by mouth 2 (two) times daily. 180 tablet 3   glucose blood (ONETOUCH VERIO) test strip 1 each by Other route 2 (two) times daily. And lancets 2/day 200 each 3   glucose blood test strip Check blood sugar twice a day 100 each 12   hydrALAZINE (APRESOLINE) 25 MG tablet Take 3 tablets (75 mg total) by mouth 3 (three) times daily. 810 tablet 3   insulin lispro (HUMALOG) 100 UNIT/ML injection Give 3 units with BREAKFAST, AND 18 units with SUPPER 60 mL 1   latanoprost (XALATAN) 0.005 % ophthalmic solution Place 1 drop into both eyes at bedtime.     montelukast (SINGULAIR) 10 MG tablet TAKE 1 TABLET BY MOUTH EVERYDAY AT BEDTIME 90 tablet 1   nitroGLYCERIN (NITROSTAT) 0.4 MG SL tablet Place 1 tablet (0.4 mg total) under the tongue every 5 (five) minutes x 3 doses as needed for chest pain. 25 tablet 2   ondansetron (ZOFRAN) 8 MG tablet Take 1 tablet (8 mg total) by mouth every 8 (eight) hours as needed for nausea or vomiting. 30 tablet 2   pantoprazole (PROTONIX) 40 MG tablet Take 1 tablet (40 mg total) by mouth daily. 180 tablet 3   potassium chloride (KLOR-CON) 10 MEQ tablet TAKE 1 TABLET BY MOUTH EVERY DAY 90 tablet 1   predniSONE (DELTASONE) 20 MG tablet Take 1 tablet (20 mg total) by mouth daily with breakfast.  5 tablet 0   promethazine-dextromethorphan (PROMETHAZINE-DM) 6.25-15 MG/5ML syrup TAKE 5 MLS BY MOUTH 4 TIMES A DAY AS NEEDED FOR COUGH 240 mL 0   sodium bicarbonate 650 MG tablet Take 650 mg by mouth 2 (two) times daily.     Syringe/Needle, Disp, (SYRINGE 3CC/25GX1") 25G X 1" 3 ML MISC Use to inject into the skin 2x a day 100 each 3   traZODone (DESYREL) 50 MG tablet Take 0.5 tablets (25 mg total) by mouth at bedtime as needed for sleep. 15 tablet 0   vitamin B-12 (CYANOCOBALAMIN) 100 MCG tablet Take 100 mcg by mouth daily.     No current facility-administered medications for this visit.    Allergies: Aspirin and Ramipril  Past Medical History:  Diagnosis Date   Allergic rhinitis  Anterior chest wall pain    Anxiety    Asthma    Chronic diastolic CHF (congestive heart failure) (HCC)    Echo 01/2020: EF 60-65, normal wall motion, mild LVH, normal RV SF, RVSP 52.6 (moderate elevation), severe LAE, moderate RAE, trivial MR, mild MS (mean gradient 5.5 mmHg), mild aortic valve sclerosis (no AS); elevated E/e' c/w elevated LVEDP   Cough    DM type 2 (diabetes mellitus, type 2) (HCC)    GERD (gastroesophageal reflux disease)    History of diverticulitis of colon    HTN (hypertension)    Hyperlipidemia    IBS (irritable bowel syndrome)    Iron deficiency anemia    Mitral stenosis 04/17/2022   Echo 04/2022: EF 55-60, no RWMA, mild LVH, normal RVSF, normal PASP, moderate LAE, moderate mitral stenosis (mean gradient 4 mmHg), trivial MR, trivial AI, AV sclerosis without stenosis   Osteoporosis, unspecified    Renal insufficiency 07/31/2017   UTI (urinary tract infection)     Past Surgical History:  Procedure Laterality Date   CARDIOVASCULAR STRESS TEST  02/25/04   CORONARY BALLOON ANGIOPLASTY N/A 12/28/2021   Procedure: CORONARY BALLOON ANGIOPLASTY;  Surgeon: Kathleene Hazel, MD;  Location: [facility];  Service: Cardiovascular;  Laterality: N/A;   CORONARY STENT INTERVENTION  N/A 12/28/2021   Procedure: CORONARY STENT INTERVENTION;  Surgeon: Kathleene Hazel, MD;  Location: [facility];  Service: Cardiovascular;  Laterality: N/A;   ESOPHAGOGASTRODUODENOSCOPY  12/26/01   PACEMAKER IMPLANT N/A 08/02/2020   Procedure: PACEMAKER IMPLANT;  Surgeon: Duke Salvia, MD;  Location: [facility] INVAS[facility] CV LAB;  Service: Cardiovascular;  Laterality: N/A;   RIGHT OOPHORECTOMY  jan 2010   RIGHT/LEFT HEART CATH AND CORONARY ANGIOGRAPHY N/A 12/28/2021   Procedure: RIGHT/LEFT HEART CATH AND CORONARY ANGIOGRAPHY;  Surgeon: Kathleene Hazel, MD;  Location: [facility];  Service: Cardiovascular;  Laterality: N/A;    Family History  Problem Relation Age of Onset   Hypertension Mother    Stroke Mother    Other Father        poor circulation   Cancer Sister    Lung cancer Brother    Melanoma Brother    Diabetes Daughter    Colon cancer Neg Hx    Liver disease Neg Hx    Esophageal cancer Neg Hx     Social History   Tobacco Use   Smoking status: Former    Current packs/day: 0.00    Average packs/day: 0.3 packs/day for 5.0 years (1.5 ttl pk-yrs)    Types: Cigarettes    Start date: 10/02/1981    Quit date: 10/02/1986    Years since quitting: 36.9   Smokeless tobacco: Never  Substance Use Topics   Alcohol use: No    Subjective:  Accompanied by her daughter; notes that has not been feeling well for the past 2 days; + scratchy sore throat/ drainage; did have to use her nebulizer yesterday because she felt like her breathing was "not good."   Objective:  Vitals:   09/04/23 1544  BP: (!) 144/90  Pulse: 75  SpO2: 93%  Weight: 170 lb 3.2 oz (77.2 kg)  Height: 5\' 6"  (1.676 m)    General: Well developed, well nourished, in no acute distress  Skin : Warm and dry.  Head: Normocephalic and atraumatic  Eyes: Sclera and conjunctiva clear; pupils round and reactive to light; extraocular movements intact  Ears: External normal; canals clear; tympanic membranes  normal  Oropharynx: Pink,  supple. No suspicious lesions  Neck: Supple without thyromegaly, adenopathy  Lungs: Respirations unlabored; coarse breath sounds in upper lobes and left lower lobe CVS exam: normal rate and regular rhythm.  Neurologic: Alert and oriented; speech intact; face symmetrical; moves all extremities well; CNII-XII intact without focal deficit   Assessment:  1. Acute bronchitis, unspecified organism   2. Lymphadenopathy     Plan:  Will treat with Doxycycline and Prednisone; encouraged to start using her Flonase; she agrees to do nebulizer treatment at least 2x per day for the next few days; she prefers not to do CXR at this time and agree is is understandable- however if symptoms persist or worsen, she will call back to get test updated;  Reviewed notes from recent ER visit- no acute concerning changes noted on CT but will update neck ultrasound to rule out any underlying concerning lymph nodes;   No follow-ups on file.  Orders Placed This Encounter  Procedures   US Soft Tissue Head/Neck (NON-THYROID)    Standing Status:   Future    Standing Expiration Date:   09/03/2024    Order Specific Question:   Reason for Exam (SYMPTOM  OR DIAGNOSIS REQUIRED)    Answer:   lymphadenopathy    Order Specific Question:   Preferred imaging location?    Answer:   [id number]    Requested Prescriptions   Signed Prescriptions Disp Refills   doxycycline (VIBRA-TABS) 100 MG tablet 14 tablet 0    Sig: Take 1 tablet (100 mg total) by mouth 2 (two) times daily.   predniSONE (DELTASONE) 20 MG tablet 5 tablet 0    Sig: Take 1 tablet (20 mg total) by mouth daily with breakfast.

## 2023-09-05 ENCOUNTER — Encounter: Payer: Self-pay | Admitting: Internal Medicine

## 2023-09-05 ENCOUNTER — Other Ambulatory Visit: Payer: Self-pay

## 2023-09-05 ENCOUNTER — Ambulatory Visit (INDEPENDENT_AMBULATORY_CARE_PROVIDER_SITE_OTHER): Payer: 59 | Admitting: Internal Medicine

## 2023-09-05 ENCOUNTER — Ambulatory Visit: Payer: 59 | Admitting: Podiatry

## 2023-09-05 VITALS — BP 120/66 | HR 66 | Temp 97.8°F | Wt 170.5 lb

## 2023-09-05 DIAGNOSIS — R768 Other specified abnormal immunological findings in serum: Secondary | ICD-10-CM

## 2023-09-05 DIAGNOSIS — L821 Other seborrheic keratosis: Secondary | ICD-10-CM | POA: Diagnosis not present

## 2023-09-05 DIAGNOSIS — J019 Acute sinusitis, unspecified: Secondary | ICD-10-CM

## 2023-09-05 DIAGNOSIS — J3089 Other allergic rhinitis: Secondary | ICD-10-CM | POA: Diagnosis not present

## 2023-09-05 DIAGNOSIS — J302 Other seasonal allergic rhinitis: Secondary | ICD-10-CM

## 2023-09-05 DIAGNOSIS — B9689 Other specified bacterial agents as the cause of diseases classified elsewhere: Secondary | ICD-10-CM | POA: Diagnosis not present

## 2023-09-05 MED ORDER — AZELASTINE HCL 0.1 % NA SOLN: 2.0000 | Freq: Two times a day (BID) | NASAL | 5 refills | Status: DC | PRN

## 2023-09-05 MED ORDER — CETIRIZINE HCL 10 MG PO TABS: 10.0000 mg | ORAL_TABLET | Freq: Every day | ORAL | 5 refills | Status: DC

## 2023-09-05 MED ORDER — FLUTICASONE PROPIONATE 50 MCG/ACT NA SUSP: 2.0000 | Freq: Every day | NASAL | 5 refills | Status: DC

## 2023-09-05 NOTE — Progress Notes (Signed)
FOLLOW UP Date of Service/Encounter:  09/05/23   Subjective:  [name] (DOB: 1936/09/27) is a 87 y.o. female who [facility] for follow up for elevated IgE, allergic rhinitis.  Also has cough variant asthma, followed by Dr Sherene Sires.   History obtained from: chart review and patient and daughter . Last visit was with me on 06/05/2023, SPT positive to RW and CR. Started on Flonase/Zyrtec daily. Also with elevated IgE with plans for recheck.  Since last visit, does report having frequent congestion and drainage.  She is using Flonase and Zyrtec daily. She has not seen Dermatology yet for SK but referral is pending.   Past Medical History: Past Medical History:  Diagnosis Date   Allergic rhinitis    Anterior chest wall pain    Anxiety    Asthma    Chronic diastolic CHF (congestive heart failure) (HCC)    Echo 01/2020: EF 60-65, normal wall motion, mild LVH, normal RV SF, RVSP 52.6 (moderate elevation), severe LAE, moderate RAE, trivial MR, mild MS (mean gradient 5.5 mmHg), mild aortic valve sclerosis (no AS); elevated E/e' c/w elevated LVEDP   Cough    DM type 2 (diabetes mellitus, type 2) (HCC)    GERD (gastroesophageal reflux disease)    History of diverticulitis of colon    HTN (hypertension)    Hyperlipidemia    IBS (irritable bowel syndrome)    Iron deficiency anemia    Mitral stenosis 04/17/2022   Echo 04/2022: EF 55-60, no RWMA, mild LVH, normal RVSF, normal PASP, moderate LAE, moderate mitral stenosis (mean gradient 4 mmHg), trivial MR, trivial AI, AV sclerosis without stenosis   Osteoporosis, unspecified    Renal insufficiency 07/31/2017   UTI (urinary tract infection)     Objective:  BP 120/66   Pulse 66   Temp 97.8 F (36.6 C) (Temporal)   Wt 170 lb 8 oz (77.3 kg)   SpO2 99%   BMI 27.52 kg/m  Body mass index is 27.52 kg/m. Physical Exam: GEN: alert, well developed HEENT: clear conjunctiva, nose with moderate inferior  turbinate hypertrophy, pink nasal mucosa, + clear rhinorrhea, + cobblestoning HEART: regular rate and rhythm, no murmur LUNGS: clear to auscultation bilaterally, no coughing, unlabored respiration SKIN: no rashes or lesions  Assessment:   1. Seasonal and perennial allergic rhinitis   2. Elevated IgE level     Plan/Recommendations:  Allergic Rhinitis: - Uncontrolled, will add Azelastine.  - Positive skin test 06/2023: cockroach, ragweed  - Avoidance measures discussed. - Use nasal saline rinses before nose sprays such as with Neilmed Sinus Rinse.  Use distilled water.   - Use Flonase 2 sprays each nostril daily. Aim upward and outward. - Use Azelastine 2 sprays each nostril twice daily as needed for congestion/drainage.  Aim upward and outward.   - Use Zyrtec 10 mg daily.  - Of note, she is not an allergy shot candidate due to her significant cardiac history with NSTEMI s/p PCI, complete heart block s/p PPM, paroxysmal Afib.     Elevated IgE - Elevated IgE with normal eosinophil count is nonspecific.  IgE 05/2023 was 919. AEC 05/2023 100 - Will recheck IgE this visit and also check SPEP with [facility].     Cough Variant Asthma - Follow up with Dr. Sherene Sires.  On Symbicort and PRN Albuterol.  Seborrheic Keratosis  - Referral to Dermatology pending- Lots of waxy, raised plaques, discussed likely seborrheic keratosis.  She does have severe itching  with two of the large ones.   Denies sudden eruption of them and also notes her daughter and mother have them too.         Return in about 6 months (around 03/05/2024).  Alesia Morin, MD [facility]

## 2023-09-05 NOTE — Patient Instructions (Addendum)
Allergic Rhinitis: - Positive skin test 06/2023: cockroach, ragweed  - Avoidance measures discussed. - Use nasal saline rinses before nose sprays such as with Neilmed Sinus Rinse.  Use distilled water.   - Use Flonase 2 sprays each nostril daily. Aim upward and outward. - Use Azelastine 2 sprays each nostril twice daily as needed for congestion/drainage.  Aim upward and outward.   - Use Zyrtec 10 mg daily.  - Of note, she is not an allergy shot candidate due to her significant cardiac history with NSTEMI s/p PCI, complete heart block s/p PPM, paroxysmal Afib.     Elevated IgE - Elevated IgE with normal eosinophil count is nonspecific.   - Will recheck levels today with bloodwork.    Cough Variant Asthma - Follow up with Dr. Sherene Sires.  On Symbicort and PRN Albuterol.  Seborrheic Keratosis  - Referral to Dermatology pending.

## 2023-09-06 ENCOUNTER — Ambulatory Visit: Payer: 59 | Admitting: Family

## 2023-09-06 ENCOUNTER — Telehealth: Payer: Self-pay | Admitting: Family

## 2023-09-06 MED ORDER — ONDANSETRON HCL 8 MG PO TABS: 8.0000 mg | ORAL_TABLET | Freq: Three times a day (TID) | ORAL | 0 refills | Status: DC | PRN

## 2023-09-06 MED ORDER — AZITHROMYCIN 250 MG PO TABS: ORAL_TABLET | ORAL | 0 refills | Status: DC

## 2023-09-06 NOTE — Telephone Encounter (Signed)
Spoke with pts daughter Vernice Jefferson is aware and expressed understanding. She states pt did not taken antibiotic today due to not feeling well and pt will start new medication tomorrow 09/07/2023.

## 2023-09-06 NOTE — Telephone Encounter (Signed)
Marcelino Duster (daughter [facility] [place]) called stating that pt is having issues with nausea with the antibiotic she is taking and was asking if an anti-nausea medication could be sent in to help with this. Routed HP due to medication reaction.

## 2023-09-06 NOTE — Addendum Note (Signed)
Addended by: Eustace Moore on: 09/06/2023 03:51 PM   Modules accepted: Orders

## 2023-09-06 NOTE — Telephone Encounter (Signed)
She needs to stop the antibiotic- we will call in an alternative for her. Wait until tomorrow to start a new medication.  We can call in small amount of Zofran to help with nausea but if she persists with chronic nausea, I want her to reach out to her GI.

## 2023-09-10 ENCOUNTER — Other Ambulatory Visit: Payer: Self-pay | Admitting: "Endocrinology

## 2023-09-10 LAB — PE AND FLC, SERUM
A/G Ratio: 1.3 (ref 0.7–1.7)
Albumin ELP: 3.8 g/dL (ref 2.9–4.4)
Alpha 1: 0.2 g/dL (ref 0.0–0.4)
Alpha 2: 0.7 g/dL (ref 0.4–1.0)
Beta: 1 g/dL (ref 0.7–1.3)
Gamma Globulin: 1.1 g/dL (ref 0.4–1.8)
Globulin, Total: 3 g/dL (ref 2.2–3.9)
Ig Kappa Free Light Chain: 45.9 mg/L — ABNORMAL HIGH (ref 3.3–19.4)
Ig Lambda Free Light Chain: 31 mg/L — ABNORMAL HIGH (ref 5.7–26.3)
KAPPA/LAMBDA RATIO: 1.48 (ref 0.26–1.65)
Total Protein: 6.8 g/dL (ref 6.0–8.5)

## 2023-09-10 LAB — IGE: IgE (Immunoglobulin E), Serum: 623 [IU]/mL — ABNORMAL HIGH (ref 6–495)

## 2023-09-12 NOTE — Progress Notes (Unsigned)
Cardiology Office Note:  .   Date:  09/13/2023  ID:  [name], DOB [birth year], MRN [id number] PCP: Olive Bass, FNP  [facility] Providers Cardiologist:  Dietrich Pates, MD {  History of Present Illness: [name] Kitchen   [name] is a 87 y.o. female with a history of HTN, DM, moderate CAD (CT in 2017 with calcium score of 51, moderate disease in the LAD with filling defect in the LAA), paroxysmal atrial fibrillation here for follow-up visit.  History includes hospitalization 2021 with dyspnea.  Found to be in CHB.  Status post PPM implant followed by Dr. Graciela Husbands.  Patient admitted 12/28/2021 with NSTEMI.  Underwent PTCA/DES to the LAD, probably lost her diagonal at that time.  LVEF was 30 to 35%, hospital course was complicated by AKI, persistent nausea, pneumonia, knee pain with aspiration and injection with cortisone.  Creatinine 4.28 at discharge followed by renal.  Patient was seen January 2024 and since then breathing was stable.  No dizziness.  No palpitations.  Today, she presents with a history of atrial fibrillation, mitral valve disease, and kidney dysfunction for a follow-up visit after a recent ER visit due to cervical radiculopathy. The patient describes the sensation in the left arm as a "scratching" feeling, but denies any weakness, numbness, or recent injury to the arm. A CT scan of the head and cervical spine was performed in the ER, which revealed arthritis in the neck. The patient has been managing the discomfort with Tylenol three times a day, which seems to be helping.  In terms of the patient's atrial fibrillation, she is currently on Eliquis and Carvedilol, which appear to be controlling the heart rate effectively. The patient denies any swelling in the feet or shortness of breath. The patient also has asthma, which could potentially contribute to any shortness of breath. An echocardiogram was performed in the summer, revealing moderate narrowing of the mitral valve.  The patient denies any symptoms of lightheadedness or dizziness.  The patient also has some kidney dysfunction and is under the care of a nephrologist. The patient's cholesterol levels were checked in the spring and were within the desired range. The patient is also on Atorvastatin, which is taken in the morning.  Reports no shortness of breath nor dyspnea on exertion. Reports no chest pain, pressure, or tightness. No edema, orthopnea, PND. Reports no palpitations.   Discussed the use of AI scribe software for clinical note transcription with the patient, who gave verbal consent to proceed.  ROS: Pertinent ROS in HPI  Studies Reviewed: .        Echo  2023/05/12     1. Left ventricular ejection fraction, by estimation, is 55 to 60%. The  left ventricle has normal function. The left ventricle has no regional  wall motion abnormalities. There is mild concentric left ventricular  hypertrophy. Left ventricular diastolic  function could not be evaluated.   2. Right ventricular systolic function is normal. The right ventricular  size is normal. There is normal pulmonary artery systolic pressure.   3. Left atrial size was severely dilated.   4. Right atrial size was severely dilated.   5. Mitral valve mean gradient 5 mmHg compared with 4 mmHg 05/11/2022. The  mitral valve is degenerative. No evidence of mitral valve regurgitation.  Moderate mitral stenosis. Moderate mitral annular calcification.   6. The aortic valve is tricuspid. There is mild calcification of the  aortic valve. There is mild thickening of the aortic  valve. Aortic valve  regurgitation is not visualized. No aortic stenosis is present.   7. The inferior vena cava is normal in size with greater than 50%  respiratory variability, suggesting right atrial pressure of 3 mmHg.  Echo   04/17/22   Compared to echo from March 2023, LVEF is improved. . Left ventricular ejection fraction, by estimation, is 55 to 60%. The left ventricle  has normal function. The left ventricle has no regional wall motion abnormalities. The left ventricular internal cavity size was mildly dilated. There is mild left ventricular hypertrophy. Left ventricular diastolic parameters are indeterminate. 1. Right ventricular systolic function is normal. The right ventricular size is normal. There is normal pulmonary artery systolic pressure. 2. 3. Left atrial size was moderately dilated. MV is thickened with restricted motion. Peak and mean gradients through the valve are 12 and 4 mm Hg MVA (VTI) is calculated at 1.12 cm2 Consistent with moderate MS. (HR 60 bpm) Compared to previous echo mean gradient is mildly decreased (6.5 to 4 mm Hg). Trivial mitral valve regurgitation. Moderate mitral annular calcification. 4. The aortic valve is tricuspid. Aortic valve regurgitation is trivial. Aortic valve sclerosis is present, with no evidence of aortic valve stenosis. 5. The inferior vena cava is normal in size with greater than 50% respiratory variability, suggesting right atrial pressure of 3 mmHg. R and L heart cath  12/28/21     Mid RCA lesion is 30% stenosed.   Dist RCA lesion is 50% stenosed.   1st Diag lesion is 90% stenosed.   Prox LAD to Mid LAD lesion is 95% stenosed.   A drug-eluting stent was successfully placed using a SYNERGY XD 3.0X16.   Balloon angioplasty was performed using a BALLN SAPPHIRE 2.0X12.   Post intervention, there is a 0% residual stenosis.   Post intervention, there is a 30% residual stenosis.   Severe proximal LAD stenosis involving a moderate caliber diagonal branch.  Successful PTCA/DES x 1 proximal LAD Successful balloon angioplasty Diagonal 1 No obstructive disease in the Circumflex artery Moderate non-obstructive disease in the dominant RCA Elevated right and left heart pressures.    Recommendations: Continue DAPT with ASA and Plavix for one month and can then stop ASA since she will also be on Eliquis. She  remains volume overload. Will need continued diuresis with caution given renal insufficiency   LABS Troponin: Negative (08/30/2023) Triglycerides: 89 (Spring 2024) LDL: 41 (Spring 2024) HDL: 47 (Spring 2024) Total cholesterol: 105 (Spring 2024)  RADIOLOGY Cervical spine CT: Arthritis in neck (08/30/2023)  DIAGNOSTIC Echocardiogram: Moderate mitral valve narrowing (04/09/2023)   Risk Assessment/Calculations:    CHA2DS2-VASc Score = 7   This indicates a 11.2% annual risk of stroke. The patient's score is based upon: CHF History: 1 HTN History: 1 Diabetes History: 1 Stroke History: 0 Vascular Disease History: 1 Age Score: 2 Gender Score: 1        Physical Exam:   VS:  BP (!) 140/68   Pulse 73   Ht 5\' 6"  (1.676 m)   Wt 170 lb 9.6 oz (77.4 kg)   SpO2 97%   BMI 27.54 kg/m    Wt Readings from Last 3 Encounters:  09/13/23 170 lb 9.6 oz (77.4 kg)  09/05/23 170 lb 8 oz (77.3 kg)  09/04/23 170 lb 3.2 oz (77.2 kg)    GEN: Well nourished, well developed in no acute distress NECK: No JVD; No carotid bruits CARDIAC: IRIR, no murmurs, rubs, gallops RESPIRATORY:  Clear to auscultation without rales, wheezing  or rhonchi  ABDOMEN: Soft, non-tender, non-distended EXTREMITIES:  No edema; No deformity   ASSESSMENT AND PLAN: .    HFpEF -euvolemic on exam -stable on current diuretic regimen, continue -daily weights recommended   HTN -well controlled 140/68 -continue current medication regimen  Cervical Radiculopathy Recent ER visit for left arm discomfort described as a scratching sensation. CT scan of head and cervical spine showed arthritis in the neck. No weakness or numbness. No recent injury. Currently managed with Tylenol arthritis three times a day. -Continue Tylenol arthritis as needed. -Follow up with primary care provider for further management.  Atrial Fibrillation Stable on Eliquis and Carvedilol. No recent symptoms of heart failure such as swelling or  shortness of breath. Heart rate controlled in the 70s. -Continue Eliquis and Carvedilol. -Plan for annual echocardiogram in summer 2025.  Hyperlipidemia LDL 41, Triglycerides 89, HDL 47, Total cholesterol 161. Currently on Atorvastatin. -Continue Atorvastatin, consider taking at bedtime to potentially improve absorption and lessen morning pill burden.  Chronic Kidney Disease Noted kidney dysfunction, patient is under the care of a nephrologist. -No changes to current management.  Moderate mitral valve stenosis -continue annual echos -no symptoms at this time      Dispo: Plan to f/u in 6 months  Signed, Sharlene Dory, PA-C

## 2023-09-13 ENCOUNTER — Encounter: Payer: Self-pay | Admitting: Physician Assistant

## 2023-09-13 ENCOUNTER — Ambulatory Visit: Payer: 59 | Attending: Physician Assistant | Admitting: Physician Assistant

## 2023-09-13 VITALS — BP 140/68 | HR 73 | Ht 66.0 in | Wt 170.6 lb

## 2023-09-13 DIAGNOSIS — Z950 Presence of cardiac pacemaker: Secondary | ICD-10-CM | POA: Diagnosis not present

## 2023-09-13 DIAGNOSIS — I050 Rheumatic mitral stenosis: Secondary | ICD-10-CM | POA: Diagnosis not present

## 2023-09-13 DIAGNOSIS — I5032 Chronic diastolic (congestive) heart failure: Secondary | ICD-10-CM

## 2023-09-13 DIAGNOSIS — N184 Chronic kidney disease, stage 4 (severe): Secondary | ICD-10-CM

## 2023-09-13 DIAGNOSIS — I2510 Atherosclerotic heart disease of native coronary artery without angina pectoris: Secondary | ICD-10-CM | POA: Diagnosis not present

## 2023-09-13 DIAGNOSIS — E785 Hyperlipidemia, unspecified: Secondary | ICD-10-CM

## 2023-09-13 DIAGNOSIS — I4891 Unspecified atrial fibrillation: Secondary | ICD-10-CM | POA: Diagnosis not present

## 2023-09-13 DIAGNOSIS — I10 Essential (primary) hypertension: Secondary | ICD-10-CM

## 2023-09-13 NOTE — Patient Instructions (Addendum)
Medication Instructions:   Your physician recommends that you continue on your current medications as directed. Please refer to the Current Medication list given to you today.   *If you need a refill on your cardiac medications before your next appointment, please call your pharmacy*   Lab Work: NONE ORDERED  TODAY    If you have labs (blood work) drawn today and your tests are completely normal, you will receive your results only by: MyChart Message (if you have MyChart) OR A paper copy in the mail If you have any lab test that is abnormal or we need to change your treatment, we will call you to review the results.   Testing/Procedures:  IN 6 MONTHS  Your physician has requested that you have an echocardiogram. Echocardiography is a painless test that uses sound waves to create images of your heart. It provides your doctor with information about the size and shape of your heart and how well your heart's chambers and valves are working. This procedure takes approximately one hour. There are no restrictions for this procedure. Please do NOT wear cologne, perfume, aftershave, or lotions (deodorant is allowed). Please arrive 15 minutes prior to your appointment time.  Please note: We ask at that you not bring children with you during ultrasound (echo/ vascular) testing. Due to room size and safety concerns, children are not allowed in the ultrasound rooms during exams. Our front office staff cannot provide observation of children in our lobby area while testing is being conducted. An adult accompanying a patient to their appointment will only be allowed in the ultrasound room at the discretion of the ultrasound technician under special circumstances. We apologize for any inconvenience.      Follow-Up: At [facility], you and your health needs are our priority.  As part of our continuing mission to provide you with exceptional heart care, we have created designated Provider Care  Teams.  These Care Teams include your primary Cardiologist (physician) and Advanced Practice Providers (APPs -  Physician Assistants and Nurse Practitioners) who all work together to provide you with the care you need, when you need it.  We recommend signing up for the patient portal called "MyChart".  Sign up information is provided on this After Visit Summary.  MyChart is used to connect with patients for Virtual Visits (Telemedicine).  Patients are able to view lab/test results, encounter notes, upcoming appointments, etc.  Non-urgent messages can be sent to your provider as well.   To learn more about what you can do with MyChart, go to [email or web address].    Your next appointment:   6 month(s)  Provider:   Dietrich Pates, MD     Other Instructions

## 2023-09-14 ENCOUNTER — Ambulatory Visit
Admission: RE | Admit: 2023-09-14 | Discharge: 2023-09-14 | Disposition: A | Discharge status: Home / Self Care | Payer: 59 | Source: Ambulatory Visit | Attending: Family | Admitting: Family

## 2023-09-14 DIAGNOSIS — K115 Sialolithiasis: Secondary | ICD-10-CM | POA: Diagnosis not present

## 2023-09-14 DIAGNOSIS — R591 Generalized enlarged lymph nodes: Secondary | ICD-10-CM

## 2023-09-17 ENCOUNTER — Other Ambulatory Visit: Payer: Self-pay | Admitting: Family

## 2023-09-17 DIAGNOSIS — K115 Sialolithiasis: Secondary | ICD-10-CM

## 2023-09-18 ENCOUNTER — Telehealth: Payer: Self-pay

## 2023-09-18 DIAGNOSIS — M810 Age-related osteoporosis without current pathological fracture: Secondary | ICD-10-CM

## 2023-09-18 MED ORDER — DENOSUMAB 60 MG/ML ~~LOC~~ SOSY
60.0000 mg | PREFILLED_SYRINGE | Freq: Once | SUBCUTANEOUS | Status: AC
  Administered 2023-11-07: 60 mg via SUBCUTANEOUS

## 2023-09-18 NOTE — Telephone Encounter (Signed)
CAM has been placed.  Please check Benefits.

## 2023-09-18 NOTE — Telephone Encounter (Signed)
-----   Message from Olive Bass sent at 09/18/2023  2:20 PM EST ----- Patient would like to start Prolia if covered by insurance. ----- Message ----- From: Judieth Keens, CMA Sent: 09/18/2023   1:35 PM EST To: Olive Bass, FNP   ----- Message ----- From: Olive Bass, FNP Sent: 09/10/2023   5:23 PM EST To: Judieth Keens, CMA  I would like to try her on medication called Prolia; it is a shot she would take 2 x per year; will have to see if her insurance covers and what her out of pocket costs would be. ----- Message ----- From: Judieth Keens, CMA Sent: 09/06/2023   4:35 PM EST To: Olive Bass, FNP   ----- Message ----- From: Olive Bass, FNP Sent: 09/06/2023  12:46 PM EST To: Judieth Keens, CMA  Her bone density does show osteoporosis- in reviewing notes, she and Dr. Everardo All (her former enodcrinologist) discussed this diagnosis as far back as 2018. With her numbers, I would recommend treatment to try and increase her bone density to hopefully prevent any fractures. Does she prefer not to do any treatment?

## 2023-09-19 ENCOUNTER — Other Ambulatory Visit: Payer: Self-pay | Admitting: Internal Medicine

## 2023-09-20 ENCOUNTER — Telehealth: Payer: Self-pay

## 2023-09-20 NOTE — Telephone Encounter (Signed)
Prolia VOB initiated via [email or web address] 

## 2023-09-24 NOTE — Progress Notes (Signed)
Spoke with pt and notified of results per Dr. Wert. Pt verbalized understanding and denied any questions. 

## 2023-09-28 NOTE — Telephone Encounter (Signed)
Pharmacy Patient Advocate Encounter   Received notification from  [employer] Portal that prior authorization for Prolia is required/requested.   Insurance verification completed.   The patient is insured through  [employer]  .   Per test claim: PA required; PA submitted to above mentioned insurance via [facility] website Key/confirmation #/EOC [id number] Status is pending

## 2023-10-05 ENCOUNTER — Telehealth: Payer: Self-pay | Admitting: Internal Medicine

## 2023-10-05 NOTE — Telephone Encounter (Signed)
 Patient daughter called and stated that she was still feeling nauseated and having right side pain of her abdomen. Patient daughter is requesting a call back. Please advise.

## 2023-10-05 NOTE — Telephone Encounter (Signed)
 Left message on machine to call back

## 2023-10-08 NOTE — Telephone Encounter (Signed)
 Pharmacy Patient Advocate Encounter  Received notification from  [employer]  that Prior Authorization for Prolia has been APPROVED from 09/28/23 to 09/27/24   PA #/Case ID/Reference #: [id number]

## 2023-10-08 NOTE — Telephone Encounter (Signed)
 Spoke with the daughter and patient. Patient is "a little better today." She has a follow up appointment in February but does not want to wait that long. Agrees to come in for evaluation of these symptoms tomorrow.

## 2023-10-09 ENCOUNTER — Ambulatory Visit (INDEPENDENT_AMBULATORY_CARE_PROVIDER_SITE_OTHER): Payer: 59 | Admitting: Internal Medicine

## 2023-10-09 ENCOUNTER — Encounter: Payer: Self-pay | Admitting: Internal Medicine

## 2023-10-09 VITALS — BP 130/80 | HR 86 | Ht 66.0 in | Wt 171.0 lb

## 2023-10-09 DIAGNOSIS — G8929 Other chronic pain: Secondary | ICD-10-CM

## 2023-10-09 DIAGNOSIS — R1032 Left lower quadrant pain: Secondary | ICD-10-CM

## 2023-10-09 DIAGNOSIS — R110 Nausea: Secondary | ICD-10-CM

## 2023-10-09 DIAGNOSIS — R933 Abnormal findings on diagnostic imaging of other parts of digestive tract: Secondary | ICD-10-CM | POA: Diagnosis not present

## 2023-10-09 DIAGNOSIS — K5900 Constipation, unspecified: Secondary | ICD-10-CM | POA: Diagnosis not present

## 2023-10-09 DIAGNOSIS — R1310 Dysphagia, unspecified: Secondary | ICD-10-CM

## 2023-10-09 MED ORDER — ONDANSETRON HCL 8 MG PO TABS: 8.0000 mg | ORAL_TABLET | Freq: Three times a day (TID) | ORAL | 0 refills | Status: DC | PRN

## 2023-10-09 NOTE — Patient Instructions (Addendum)
 We have sent the following medications to your pharmacy for you to pick up at your convenience: Zofran  Take Miralax once daily  Start a daily fiber supplement such as Benefiber  Please increase hydration and physical activity   If your blood pressure at your visit was 140/90 or greater, please contact your primary care physician to follow up on this.  _______________________________________________________  If you are age 86 or older, your body mass index should be between 23-30. Your Body mass index is 27.6 kg/m. If this is out of the aforementioned range listed, please consider follow up with your Primary Care Provider.  If you are age 28 or younger, your body mass index should be between 19-25. Your Body mass index is 27.6 kg/m. If this is out of the aformentioned range listed, please consider follow up with your Primary Care Provider.   ________________________________________________________  The [facility] providers would like to encourage you to use MYCHART to communicate with providers for non-urgent requests or questions.  Due to long hold times on the telephone, sending your provider a message by [facility] may be a faster and more efficient way to get a response.  Please allow 48 business hours for a response.  Please remember that this is for non-urgent requests.  _______________________________________________________   Thank you for entrusting me with your care and for choosing [facility], Dr. Estefana Kidney.

## 2023-10-09 NOTE — Progress Notes (Signed)
 Chief Complaint: Dysphagia, LLQ ab pain  HPI : 88 year old female with history of HFpEF, A-fib on Eliquis , CAD on Plavix , DM, GERD, asthma, IBS, prior diverticulitis presents for follow up of dysphagia and LLQ ab pain  Patient presents with dysphagia for years. She states that dysphagia has been present since her EGD procedure in 2009.  Her dysphagia has been stable in severity.  Endorses dysphagia to both solids and liquids.  Dysphagia tends to occur in the bottom of her throat, and sometimes she has to regurgitate food back up after eating it.   Interval History:  She had a flare of the LLQ ab pain starting this past week. This is the same LLQ ab pain that she has had for a long time. This pain is not any worse than it has been in the past. She is still having one BM every 3-4 days. LLQ ab pain would get worse when she gets constipated. She takes Miralax a couple of times per week currently. Did not start on any fiber supplement.  She is scheduled to see ENT on the 1/29 for her dysphagia, which has been stable. She has had difficulty drinking large amounts of liquids due to her dysphagia.  Denies fevers.   Past Medical History:  Diagnosis Date   Allergic rhinitis    Anterior chest wall pain    Anxiety    Asthma    Chronic diastolic CHF (congestive heart failure) (HCC)    Echo 01/2020: EF 60-65, normal wall motion, mild LVH, normal RV SF, RVSP 52.6 (moderate elevation), severe LAE, moderate RAE, trivial MR, mild MS (mean gradient 5.5 mmHg), mild aortic valve sclerosis (no AS); elevated E/e' c/w elevated LVEDP   Cough    DM type 2 (diabetes mellitus, type 2) (HCC)    GERD (gastroesophageal reflux disease)    History of diverticulitis of colon    HTN (hypertension)    Hyperlipidemia    IBS (irritable bowel syndrome)    Iron deficiency anemia    Mitral stenosis 04/17/2022   Echo 04/2022: EF 55-60, no RWMA, mild LVH, normal RVSF, normal PASP, moderate LAE, moderate mitral stenosis (mean  gradient 4 mmHg), trivial MR, trivial AI, AV sclerosis without stenosis   Osteoporosis, unspecified    Renal insufficiency 07/31/2017   UTI (urinary tract infection)      Past Surgical History:  Procedure Laterality Date   CARDIOVASCULAR STRESS TEST  02/25/04   CORONARY BALLOON ANGIOPLASTY N/A 12/28/2021   Procedure: CORONARY BALLOON ANGIOPLASTY;  Surgeon: Verlin Lonni BIRCH, MD;  Location: [facility];  Service: Cardiovascular;  Laterality: N/A;   CORONARY STENT INTERVENTION N/A 12/28/2021   Procedure: CORONARY STENT INTERVENTION;  Surgeon: Verlin Lonni BIRCH, MD;  Location: [facility];  Service: Cardiovascular;  Laterality: N/A;   ESOPHAGOGASTRODUODENOSCOPY  12/26/01   PACEMAKER IMPLANT N/A 08/02/2020   Procedure: PACEMAKER IMPLANT;  Surgeon: Fernande Elspeth BROCKS, MD;  Location: [facility] INVASIVE CV LAB;  Service: Cardiovascular;  Laterality: N/A;   RIGHT OOPHORECTOMY  jan 2010   RIGHT/LEFT HEART CATH AND CORONARY ANGIOGRAPHY N/A 12/28/2021   Procedure: RIGHT/LEFT HEART CATH AND CORONARY ANGIOGRAPHY;  Surgeon: Verlin Lonni BIRCH, MD;  Location: [facility];  Service: Cardiovascular;  Laterality: N/A;   Family History  Problem Relation Age of Onset   Hypertension Mother    Stroke Mother    Other Father        poor circulation   Cancer Sister    Lung cancer Brother  Melanoma Brother    Diabetes Daughter    Colon cancer Neg Hx    Liver disease Neg Hx    Esophageal cancer Neg Hx    Social History   Tobacco Use   Smoking status: Former    Current packs/day: 0.00    Average packs/day: 0.3 packs/day for 5.0 years (1.5 ttl pk-yrs)    Types: Cigarettes    Start date: 10/02/1981    Quit date: 10/02/1986    Years since quitting: 37.0   Smokeless tobacco: Never  Vaping Use   Vaping status: Never Used  Substance Use Topics   Alcohol use: No   Drug use: No   Current Outpatient Medications  Medication Sig Dispense Refill   acetaminophen  (TYLENOL ) 325 MG  tablet Take 650 mg by mouth every 6 (six) hours as needed for pain.     albuterol  (PROAIR  HFA) 108 (90 Base) MCG/ACT inhaler 2 puffs every 4 hours as needed only  if your can't catch your breath 18 g 11   albuterol  (PROVENTIL ) (2.5 MG/3ML) 0.083% nebulizer solution Take 3 mLs (2.5 mg total) by nebulization every 6 (six) hours as needed for wheezing or shortness of breath. 150 mL 1   Ascorbic Acid (VITAMIN C) 1000 MG tablet Take 1,000 mg by mouth daily.     atorvastatin  (LIPITOR ) 80 MG tablet TAKE 1 TABLET BY MOUTH EVERY DAY 90 tablet 3   azelastine  (ASTELIN ) 0.1 % nasal spray Place 2 sprays into both nostrils 2 (two) times daily as needed for allergies. Use in each nostril as directed 30 mL 5   BD VEO INSULIN  SYRINGE U/F 31G X 15/64 0.5 ML MISC USE AS INSTRUCTED 100 each 3   benzonatate  (TESSALON ) 200 MG capsule Take 1 capsule (200 mg total) by mouth 3 (three) times daily as needed. 30 capsule 1   Blood Glucose Monitoring Suppl (ONETOUCH VERIO FLEX SYSTEM) w/Device KIT USE AS DIRECTED 1 kit .   budesonide -formoterol  (SYMBICORT ) 160-4.5 MCG/ACT inhaler TAKE 2 PUFFS FIRST THING IN AM AND THEN ANOTHER 2 PUFFS ABOUT 12 HOURS LATER. 30.6 each 6   carvedilol  (COREG ) 3.125 MG tablet TAKE 1 TABLET BY MOUTH TWICE A DAY WITH A MEAL 180 tablet 0   cetirizine  (ZYRTEC  ALLERGY ) 10 MG tablet Take 1 tablet (10 mg total) by mouth daily. 30 tablet 5   cholecalciferol (VITAMIN D3) 25 MCG (1000 UNIT) tablet Take 1,000 Units by mouth daily. Isn't taking regularly     clopidogrel  (PLAVIX ) 75 MG tablet TAKE 1 TABLET BY MOUTH DAILY WITH BREAKFAST. 90 tablet 2   diclofenac  Sodium (VOLTAREN ) 1 % GEL Apply 4 g topically 4 (four) times daily as needed (pain).     ELIQUIS  2.5 MG TABS tablet TAKE 1 TABLET BY MOUTH TWICE A DAY 60 tablet 5   famotidine  (PEPCID ) 20 MG tablet TAKE 1 TABLET BY MOUTH TWICE A DAY 180 tablet 1   FARXIGA  5 MG TABS tablet TAKE 1 TABLET BY MOUTH EVERY DAY BEFORE BREAKFAST 30 tablet 3   fluticasone   (FLONASE ) 50 MCG/ACT nasal spray Place 2 sprays into both nostrils daily. 16 g 5   furosemide  (LASIX ) 40 MG tablet TAKE 1 TABLET BY MOUTH TWICE A DAY 180 tablet 3   glucose blood (ONETOUCH VERIO) test strip 1 each by Other route 2 (two) times daily. And lancets 2/day 200 each 3   glucose blood test strip Check blood sugar twice a day 100 each 12   hydrALAZINE  (APRESOLINE ) 25 MG tablet Take 3 tablets (75  mg total) by mouth 3 (three) times daily. 810 tablet 3   insulin  lispro (HUMALOG ) 100 UNIT/ML injection Give 3 units with BREAKFAST, AND 18 units with SUPPER 60 mL 1   latanoprost  (XALATAN ) 0.005 % ophthalmic solution Place 1 drop into both eyes at bedtime.     montelukast  (SINGULAIR ) 10 MG tablet TAKE 1 TABLET BY MOUTH EVERYDAY AT BEDTIME 90 tablet 1   nitroGLYCERIN  (NITROSTAT ) 0.4 MG SL tablet Place 1 tablet (0.4 mg total) under the tongue every 5 (five) minutes x 3 doses as needed for chest pain. 25 tablet 2   ondansetron  (ZOFRAN ) 8 MG tablet Take 1 tablet (8 mg total) by mouth every 8 (eight) hours as needed for nausea or vomiting. 20 tablet 0   pantoprazole  (PROTONIX ) 40 MG tablet Take 1 tablet (40 mg total) by mouth daily. 180 tablet 3   potassium chloride  (KLOR-CON ) 10 MEQ tablet TAKE 1 TABLET BY MOUTH EVERY DAY 90 tablet 1   promethazine -dextromethorphan (PROMETHAZINE -DM) 6.25-15 MG/5ML syrup TAKE 5 MLS BY MOUTH 4 TIMES A DAY AS NEEDED FOR COUGH 240 mL 0   sodium bicarbonate 650 MG tablet Take 650 mg by mouth 2 (two) times daily.     Syringe/Needle, Disp, (SYRINGE 3CC/25GX1) 25G X 1 3 ML MISC Use to inject into the skin 2x a day 100 each 3   traZODone  (DESYREL ) 50 MG tablet Take 0.5 tablets (25 mg total) by mouth at bedtime as needed for sleep. 15 tablet 0   vitamin B-12 (CYANOCOBALAMIN ) 100 MCG tablet Take 100 mcg by mouth daily.     predniSONE  (DELTASONE ) 20 MG tablet Take 1 tablet (20 mg total) by mouth daily with breakfast. (Patient not taking: Reported on 10/09/2023) 5 tablet 0    Current Facility-Administered Medications  Medication Dose Route Frequency Provider Last Rate Last Admin   denosumab  (PROLIA ) injection 60 mg  60 mg Subcutaneous Once Jason Leita Repine, FNP       Allergies  Allergen Reactions   Aspirin  Shortness Of Breath and Other (See Comments)    Caused asthma symptoms   Ramipril Cough   Doxycycline      Nausea     Physical Exam: BP 130/80   Pulse 86   Ht 5' 6 (1.676 m)   Wt 171 lb (77.6 kg)   BMI 27.60 kg/m  Constitutional: Pleasant,well-developed, female in no acute distress. HEENT: Normocephalic and atraumatic. No oral thrush visualized Cardiovascular: Normal rate, regular rhythm.  Pulmonary/chest: Effort normal and breath sounds normal. No wheezing, rales or rhonchi. Abdominal: Soft, nondistended, tender in the left side of the abdomen Extremities: No edema Neurological: Alert and oriented to person place and time. Skin: Skin is warm and dry. No rashes noted. Psychiatric: Normal mood and affect. Behavior is normal.  Labs 04/2023: CMP with nml LFTs, low Na of 133, elevated Cr of 2.74, and glucose of 471.   Labs 05/2023: CBC with low plts of 110. BMP with elevated Cr of 2.82 and mildly low Na of 134. BNP mildly elevated at 214. TSH nml. ESR nml.   Labs 08/2023: CBC with low plts of 139. CMP with elevated Cr of 2.61 and elevated glucose.  Gastric emptying study 02/20/14: IMPRESSION:  Normal exam.   HIDA scan 12/25/21: IMPRESSION: Normal hepatobiliary scan, demonstrating patency of both the cystic and common bile ducts.  CT A/P w/o contrast 07/19/22: IMPRESSION: 1. No acute abdominopelvic findings. 2. Colonic diverticulosis without acute diverticulitis. 3. Cholelithiasis without evidence of acute cholecystitis. 4. Punctate nonobstructing left lower pole  renal calculus. 5. Small hiatal hernia. 6. Multichamber cardiomegaly with coronary artery calcifications and aortic valvular and mitral annular calcifications. 7. Similar  bibasilar linear atelectasis/scarring with subsegmental mucous plugging and mild bronchiectasis left lower lobe. 8. Aortic Atherosclerosis (ICD10-I70.0).  Chest CT w/o contrast 04/27/23: IMPRESSION: 1. Bronchial wall thickening and mucous plugging in the lower lobes greatest in the left lower lobe. 2. Small hiatal hernia with mild wall thickening of the lower esophagus suggesting esophagitis. 3. Cholelithiasis.  Barium swallow 06/22/23: IMPRESSION: 1. Presbyesophagus with tertiary contractions and corkscrew appearance in the mid and distal esophagus. The contractions and intermittent spasms limit our ability to assess for esophageal narrowings. 2. Initially substantially delayed emptying of the esophagus into the stomach in the upright position. Suspected component of esophageal spasm. Subsequently the esophagus did spontaneously empty into the stomach in the upright position. 3. Small type 1 hiatal hernia. 4. 13 mm barium tablet impacted just above the hiatal hernia.  EGD 04/02/08:   Colonoscopy 12/18/19: Excellent prep.    ASSESSMENT AND PLAN: Dysphagia (present for years) Abnormal barium swallow Chronic LLQ ab pain Nausea Constipation Patient's chronic dysphagia is still stable in severity but persistent to both solids/liquids. Her last EGD in 2009 showed candida esophagitis that was treated with fluconazole  and gastritis.  A course of fluconazole  was given due to concern for oral thrush during her last clinic appointment.  However patient has not had significant improvement in her dysphagia after a course of fluconazole  therapy.  Her oral thrush is no longer present on exam today.  Patient's last barium swallow did show a small hiatal hernia and her barium tablet impacted at just above the hiatal hernia.  Patient may be at risk for having an esophageal stricture that is leading to her dysphagia.  However patient has been hesitant in the past to undergo EGD because she would like to  avoid sedation if at all possible due to her age and comorbidities.  She continues to decline an EGD at this time and would prefer conservative measures.  Patient's main concern today is her LLQ ab pain, which I think is primarily related to constipation. I asked her to increase her Miralax to every day and to take a daily fiber supplement. I also encourage her to drink more water and to increase her physical activity if possible. CT was offered since her last CT scan was in 2023, but she declined this today. Her nausea may be related to her constipation. - Increase hydration and physical activity - Start daily fiber supplement  - Increase to daily Miralax - Declined EGD at this time - Declined CT A/P for today - Refill Zofran  - Continue pantoprazole  QD - Already scheduled for follow up next month  Estefana Kidney, MD  I spent 36 minutes of time, including in depth chart review, independent review of results as outlined above, communicating results with the patient directly, face-to-face time with the patient, coordinating care, ordering studies and medications as appropriate, and documentation.

## 2023-10-09 NOTE — Telephone Encounter (Signed)
 Prolia VOB initiated via [email or web address] FOR 2025

## 2023-10-18 ENCOUNTER — Telehealth: Payer: Self-pay

## 2023-10-18 NOTE — Progress Notes (Signed)
Care Guide Pharmacy Note  10/18/2023 Name: [name] MRN: [id number] DOB: 02-04-1936  Referred By: Olive Bass, FNP Reason for referral: Care Coordination ([facility] Diabetes. )   [name] is a 88 y.o. year old female who is a primary care patient of Olive Bass, FNP.  [name] was referred to the pharmacist for assistance related to: DMII  Successful contact was made with the patient to discuss pharmacy services.  Patient declines engagement at this time. Contact information was provided to the patient should they wish to reach out for assistance at a later time.  [facility]  [facility], [facility] Care Management Assistant Direct Dial: [phone]  Fax: [phone]

## 2023-10-25 NOTE — Telephone Encounter (Signed)
Needing to check status on pt [employer] benefits.

## 2023-10-29 ENCOUNTER — Ambulatory Visit: Payer: 59

## 2023-10-29 ENCOUNTER — Other Ambulatory Visit (HOSPITAL_COMMUNITY): Payer: Self-pay

## 2023-10-29 DIAGNOSIS — I4891 Unspecified atrial fibrillation: Secondary | ICD-10-CM

## 2023-10-29 DIAGNOSIS — I442 Atrioventricular block, complete: Secondary | ICD-10-CM | POA: Diagnosis not present

## 2023-10-29 LAB — CUP PACEART REMOTE DEVICE CHECK
Battery Remaining Longevity: 124 mo
Battery Voltage: 3.01 V
Brady Statistic RV Percent Paced: 99.77 %
Date Time Interrogation Session: 20250126231609
Implantable Lead Connection Status: 753985
Implantable Lead Implant Date: 20211101
Implantable Lead Location: 753860
Implantable Lead Model: 3830
Implantable Pulse Generator Implant Date: 20211101
Lead Channel Impedance Value: 380 Ohm
Lead Channel Impedance Value: 456 Ohm
Lead Channel Pacing Threshold Amplitude: 0.75 V
Lead Channel Pacing Threshold Pulse Width: 0.4 ms
Lead Channel Sensing Intrinsic Amplitude: 10.375 mV
Lead Channel Sensing Intrinsic Amplitude: 10.375 mV
Lead Channel Setting Pacing Amplitude: 2 V
Lead Channel Setting Pacing Pulse Width: 0.4 ms
Lead Channel Setting Sensing Sensitivity: 0.9 mV
Zone Setting Status: 755011

## 2023-10-29 NOTE — Telephone Encounter (Signed)
Pharmacy Patient Advocate Encounter  Insurance verification completed.   The patient is insured through  [employer] test claim for [employer]. Currently a quantity of is a 180 day supply and the co-pay is $0 .  This test claim was processed through [facility]- copay amounts may vary at other pharmacies due to pharmacy/plan contracts, or as the patient moves through the different stages of their insurance plan.

## 2023-10-31 ENCOUNTER — Encounter (INDEPENDENT_AMBULATORY_CARE_PROVIDER_SITE_OTHER): Payer: Self-pay | Admitting: Otolaryngology

## 2023-10-31 ENCOUNTER — Ambulatory Visit (INDEPENDENT_AMBULATORY_CARE_PROVIDER_SITE_OTHER): Payer: 59 | Admitting: Otolaryngology

## 2023-10-31 VITALS — BP 130/74 | HR 79 | Ht 66.0 in | Wt 170.0 lb

## 2023-10-31 DIAGNOSIS — K219 Gastro-esophageal reflux disease without esophagitis: Secondary | ICD-10-CM

## 2023-10-31 DIAGNOSIS — R1310 Dysphagia, unspecified: Secondary | ICD-10-CM

## 2023-10-31 DIAGNOSIS — R070 Pain in throat: Secondary | ICD-10-CM

## 2023-10-31 DIAGNOSIS — K224 Dyskinesia of esophagus: Secondary | ICD-10-CM

## 2023-10-31 DIAGNOSIS — K115 Sialolithiasis: Secondary | ICD-10-CM

## 2023-10-31 NOTE — Progress Notes (Unsigned)
ENT CONSULT:  Reason for Consult: concern for salivary gland stones and choking when drinking   HPI: [name] is an 88 y.o. female with hx   Records Reviewed:  ED visit 08/31/23 [name] is a 88 y.o. female with h/o hypertension, CAD, A-fib on Eliquis, diabetes, hypertension presents to the emergency department today for evaluation of left arm tingling for the past few days.  Reports is intermittent without any exacerbating or relieving factors.  She is not having any chest pain or shortness of breath.  She does not feel weak in the arm.  She denies any numbness or loss of sensation into it.  She denies any recent injury. Denies any swelling to the area.  She reports that she has had a heart attack before and was concerned that she was having one again, but did not feel similar to her previous one. Allergic to ASA.  88 y.o. female presents to the ER for evaluation of Left arm tingling intermittently. Differential diagnosis includes but is not limited to cervical radiculopathy, ACS, MSK. Vital signs Mildly elevated BP, borderline bradycardia, otherwise unremarkable. Physical exam as noted above.    CT scan of the head shows 1. No acute intracranial process. 2. 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 Per radiologist's interpretation.     CT cervical spine shows  1. No acute displaced fracture or traumatic listhesis of the cervical spine. 2. Please see separately dictated CT head 08/31/2023. 3. Left submandibular gland calcifications.    I independently reviewed and interpreted the patient's labs.  CBC shows chronic thrombocytopenia otherwise unremarkable.  No leukocytosis or anemia.  CMP shows glucose of 348 with a decrease in bicarb of 17 however closed anion gap.  Creatinine at baseline.  Sodium mildly low at 134 however likely pseudohyponatremia.  No other electrolyte or LFT abnormality.  Troponin at 21 which is her baseline.  Repeat was at 28.   The patient  does have a decrease in bicarb at 17, but has a closed gap. She did not give herself her insulin this morning. Encourage her to remember to take her nightly dose and to be checking her BS more often. Doubt DKA. Recommended fluid and diet changes.    EKG reviewed and interpreted by my attending and read as Ventricular-paced rhythm.   She is not having any chest pain or SOB. She does not feel weak in the arm. She reports she has feeling in the arm, it just feels tingling/pins & needles. Troponins are at the baseline. She is well appearing. Arm symptoms are not reproduced with movement of the head/neck. There are now overlying skin changes. Coloration, size, and temperature appear and feel symmetric. Equal grip strength. Strength is 5/5 in the patient's bilateral upper extremities. CT head and neck do not shows any signs of acute stroke. 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 She is likely having some radiculopathy from the arthritis in her neck. My attending assessed at bedside and agrees. Will discharge home. Recommend Tylenol and lidocaine patches and following up with PCP.     Past Medical History:  Diagnosis Date   Allergic rhinitis    Anterior chest wall pain    Anxiety    Asthma    Chronic diastolic CHF (congestive heart failure) (HCC)    Echo 01/2020: EF 60-65, normal wall motion, mild LVH, normal RV SF, RVSP 52.6 (moderate elevation), severe LAE, moderate RAE, trivial MR, mild MS (mean gradient 5.5 mmHg), mild aortic  valve sclerosis (no AS); elevated E/e' c/w elevated LVEDP   Cough    DM type 2 (diabetes mellitus, type 2) (HCC)    GERD (gastroesophageal reflux disease)    History of diverticulitis of colon    HTN (hypertension)    Hyperlipidemia    IBS (irritable bowel syndrome)    Iron deficiency anemia    Mitral stenosis 04/17/2022   Echo 04/2022: EF 55-60, no RWMA, mild LVH, normal RVSF, normal PASP, moderate LAE, moderate mitral stenosis (mean gradient 4 mmHg), trivial  MR, trivial AI, AV sclerosis without stenosis   Osteoporosis, unspecified    Renal insufficiency 07/31/2017   UTI (urinary tract infection)     Past Surgical History:  Procedure Laterality Date   CARDIOVASCULAR STRESS TEST  02/25/04   CORONARY BALLOON ANGIOPLASTY N/A 12/28/2021   Procedure: CORONARY BALLOON ANGIOPLASTY;  Surgeon: Kathleene Hazel, MD;  Location: [facility];  Service: Cardiovascular;  Laterality: N/A;   CORONARY STENT INTERVENTION N/A 12/28/2021   Procedure: CORONARY STENT INTERVENTION;  Surgeon: Kathleene Hazel, MD;  Location: [facility];  Service: Cardiovascular;  Laterality: N/A;   ESOPHAGOGASTRODUODENOSCOPY  12/26/01   PACEMAKER IMPLANT N/A 08/02/2020   Procedure: PACEMAKER IMPLANT;  Surgeon: Duke Salvia, MD;  Location: [facility] INVASIVE CV LAB;  Service: Cardiovascular;  Laterality: N/A;   RIGHT OOPHORECTOMY  jan 2010   RIGHT/LEFT HEART CATH AND CORONARY ANGIOGRAPHY N/A 12/28/2021   Procedure: RIGHT/LEFT HEART CATH AND CORONARY ANGIOGRAPHY;  Surgeon: Kathleene Hazel, MD;  Location: [facility];  Service: Cardiovascular;  Laterality: N/A;    Family History  Problem Relation Age of Onset   Hypertension Mother    Stroke Mother    Other Father        poor circulation   Cancer Sister    Lung cancer Brother    Melanoma Brother    Diabetes Daughter    Colon cancer Neg Hx    Liver disease Neg Hx    Esophageal cancer Neg Hx     Social History:  reports that she quit smoking about 37 years ago. Her smoking use included cigarettes. She started smoking about 42 years ago. She has a 1.5 pack-year smoking history. She has never used smokeless tobacco. She reports that she does not drink alcohol and does not use drugs.  Allergies:  Allergies  Allergen Reactions   Aspirin Shortness Of Breath and Other (See Comments)    Caused asthma symptoms   Ramipril Cough   Doxycycline     Nausea    Medications: I have reviewed the  patient's current medications.  The PMH, PSH, Medications, Allergies, and SH were reviewed and updated.  ROS: Constitutional: Negative for fever, weight loss and weight gain. Cardiovascular: Negative for chest pain and dyspnea on exertion. Respiratory: Is not experiencing shortness of breath at rest. Gastrointestinal: Negative for nausea and vomiting. Neurological: Negative for headaches. Psychiatric: The patient is not nervous/anxious  There were no vitals taken for this visit.  PHYSICAL EXAM:  Exam: General: Well-developed, well-nourished Communication and Voice: Clear pitch and clarity Respiratory Respiratory effort: Equal inspiration and expiration without stridor Cardiovascular Peripheral Vascular: Warm extremities with equal color/perfusion Eyes: No nystagmus with equal extraocular motion bilaterally Neuro/Psych/Balance: Patient oriented to person, place, and time; Appropriate mood and affect; Gait is intact with no imbalance; Cranial nerves I-XII are intact Head and Face Inspection: Normocephalic and atraumatic without mass or lesion Palpation: Facial skeleton intact without bony stepoffs Salivary Glands: No mass or  tenderness Facial Strength: Facial motility symmetric and full bilaterally ENT Pinna: External ear intact and fully developed External canal: Canal is patent with intact skin Tympanic Membrane: Clear and mobile External Nose: No scar or anatomic deformity Internal Nose: Septum is ***. No polyp, or purulence. Mucosal edema and erythema present.  Bilateral inferior turbinate hypertrophy.  Lips, Teeth, and gums: Mucosa and teeth intact and viable TMJ: No pain to palpation with full mobility Oral cavity/oropharynx: No erythema or exudate, no lesions present Nasopharynx: No mass or lesion with intact mucosa Hypopharynx: Intact mucosa without pooling of secretions Larynx Glottic: Full true vocal cord mobility without lesion or mass Supraglottic: Normal  appearing epiglottis and AE folds Interarytenoid Space: Moderate pachydermia&edema Subglottic Space: Patent without lesion or edema Neck Neck and Trachea: Midline trachea without mass or lesion Thyroid: No mass or nodularity Lymphatics: No lymphadenopathy  Procedure: none   Studies Reviewed: Neck U/S 09/14/23 CLINICAL DATA:  Left submandibular swelling   EXAM: ULTRASOUND OF HEAD/NECK SOFT TISSUES   TECHNIQUE: Ultrasound examination of the head and neck soft tissues was performed in the area of clinical concern.   COMPARISON:  CT 08/31/2023   FINDINGS: Left submandibular calcifications presumably sialoliths corresponding to previous findings on CT. No mass or adenopathy. Limited contralateral images normal.   IMPRESSION: Left submandibular sialoliths.   Esophagram 06/22/23 IMPRESSION: 1. Presbyesophagus with tertiary contractions and corkscrew appearance in the mid and distal esophagus. The contractions and intermittent spasms limit our ability to assess for esophageal narrowings. 2. Initially substantially delayed emptying of the esophagus into the stomach in the upright position. Suspected component of esophageal spasm. Subsequently the esophagus did spontaneously empty into the stomach in the upright position. 3. Small type 1 hiatal hernia. 4. 13 mm barium tablet impacted just above the hiatal hernia.  Assessment/Plan: No diagnosis found.  ***  Thank you for allowing me to participate in the care of this patient. Please do not hesitate to contact me with any questions or concerns.   Ashok Croon, MD Otolaryngology [facility] Phone: [phone] Fax: [phone]    10/31/2023, 10:09 AM

## 2023-11-01 ENCOUNTER — Other Ambulatory Visit (HOSPITAL_COMMUNITY): Payer: Self-pay | Admitting: *Deleted

## 2023-11-01 DIAGNOSIS — R1310 Dysphagia, unspecified: Secondary | ICD-10-CM

## 2023-11-02 MED ORDER — DENOSUMAB 60 MG/ML ~~LOC~~ SOSY: 60.0000 mg | PREFILLED_SYRINGE | SUBCUTANEOUS | 0 refills | Status: DC

## 2023-11-02 NOTE — Telephone Encounter (Signed)
Pti scheduled for 11/07/23.  Prolia will be coming from pharmacy.

## 2023-11-02 NOTE — Addendum Note (Signed)
Addended by: Thelma Barge D on: 11/02/2023 08:48 AM   Modules accepted: Orders

## 2023-11-06 ENCOUNTER — Other Ambulatory Visit: Payer: Self-pay

## 2023-11-06 ENCOUNTER — Encounter (HOSPITAL_COMMUNITY): Payer: Self-pay

## 2023-11-06 ENCOUNTER — Other Ambulatory Visit (HOSPITAL_COMMUNITY): Payer: Self-pay

## 2023-11-06 ENCOUNTER — Other Ambulatory Visit (HOSPITAL_COMMUNITY): Payer: Self-pay | Admitting: Pharmacy Technician

## 2023-11-06 MED ORDER — DENOSUMAB 60 MG/ML ~~LOC~~ SOSY
60.0000 mg | PREFILLED_SYRINGE | SUBCUTANEOUS | 0 refills | Status: DC
Start: 2023-11-06 — End: 2024-06-06
  Filled 2023-11-06 – 2023-11-07 (×2): qty 1, 180d supply, fill #0

## 2023-11-06 NOTE — Addendum Note (Signed)
Addended by: Thelma Barge D on: 11/06/2023 04:03 PM   Modules accepted: Orders

## 2023-11-07 ENCOUNTER — Telehealth: Payer: Self-pay

## 2023-11-07 ENCOUNTER — Ambulatory Visit (INDEPENDENT_AMBULATORY_CARE_PROVIDER_SITE_OTHER): Payer: 59

## 2023-11-07 ENCOUNTER — Other Ambulatory Visit: Payer: Self-pay | Admitting: Family

## 2023-11-07 ENCOUNTER — Other Ambulatory Visit: Payer: Self-pay

## 2023-11-07 DIAGNOSIS — M810 Age-related osteoporosis without current pathological fracture: Secondary | ICD-10-CM

## 2023-11-07 MED ORDER — DENOSUMAB 60 MG/ML ~~LOC~~ SOSY: 60.0000 mg | PREFILLED_SYRINGE | Freq: Once | SUBCUTANEOUS | Status: AC

## 2023-11-07 NOTE — Telephone Encounter (Signed)
 Prolia  was done 11/07/23 Next is due 05/06/24 CAM placed.

## 2023-11-07 NOTE — Progress Notes (Signed)
 Pharmacy Patient Advocate Encounter  Insurance verification completed.   The patient is insured through [employer] claim for Prolia. Currently a quantity of 1ml is a 180 day supply and the co-pay is $0.  This test claim was processed through [facility]- copay amounts may vary at other pharmacies due to pharmacy/plan contracts, or as the patient moves through the different stages of their insurance plan.

## 2023-11-07 NOTE — Progress Notes (Signed)
 Specialty Pharmacy Initial Fill Coordination Note  [name] is a 88 y.o. female contacted today regarding initial fill of specialty medication(s) Denosumab  (PROLIA )   Patient requested Courier to Provider Office   Delivery date: 11/09/23   Verified address: [facility] [address] [facility], [address]   Medication will be filled on 2/6.   Patient is aware of $0 copayment.

## 2023-11-07 NOTE — Telephone Encounter (Signed)
 Still awaiting rx from pharmacy.  Advised pt that she will be getting a call from pharmacy.

## 2023-11-07 NOTE — Progress Notes (Signed)
 Pt here for Prolia  shot. Pt handled well. Shot given in right subcutaneous. Pt given Proila information sheet.

## 2023-11-08 ENCOUNTER — Ambulatory Visit (HOSPITAL_BASED_OUTPATIENT_CLINIC_OR_DEPARTMENT_OTHER)
Admission: RE | Admit: 2023-11-08 | Discharge: 2023-11-08 | Disposition: A | Discharge status: Home / Self Care | Payer: 59 | Source: Ambulatory Visit | Attending: Family Medicine | Admitting: Family Medicine

## 2023-11-08 ENCOUNTER — Telehealth (HOSPITAL_BASED_OUTPATIENT_CLINIC_OR_DEPARTMENT_OTHER): Payer: Self-pay | Admitting: *Deleted

## 2023-11-08 ENCOUNTER — Ambulatory Visit (INDEPENDENT_AMBULATORY_CARE_PROVIDER_SITE_OTHER): Payer: 59 | Admitting: Family Medicine

## 2023-11-08 ENCOUNTER — Other Ambulatory Visit: Payer: Self-pay | Admitting: Internal Medicine

## 2023-11-08 ENCOUNTER — Encounter (HOSPITAL_BASED_OUTPATIENT_CLINIC_OR_DEPARTMENT_OTHER): Payer: Self-pay | Admitting: Family Medicine

## 2023-11-08 ENCOUNTER — Ambulatory Visit: Payer: Self-pay | Admitting: Family

## 2023-11-08 VITALS — BP 140/72 | HR 71 | Ht 66.0 in | Wt 176.4 lb

## 2023-11-08 DIAGNOSIS — R220 Localized swelling, mass and lump, head: Secondary | ICD-10-CM | POA: Diagnosis not present

## 2023-11-08 DIAGNOSIS — I672 Cerebral atherosclerosis: Secondary | ICD-10-CM | POA: Diagnosis not present

## 2023-11-08 DIAGNOSIS — K1120 Sialoadenitis, unspecified: Secondary | ICD-10-CM | POA: Diagnosis not present

## 2023-11-08 DIAGNOSIS — M26622 Arthralgia of left temporomandibular joint: Secondary | ICD-10-CM

## 2023-11-08 DIAGNOSIS — K111 Hypertrophy of salivary gland: Secondary | ICD-10-CM | POA: Diagnosis not present

## 2023-11-08 MED ORDER — AMOXICILLIN 500 MG PO CAPS: 500.0000 mg | ORAL_CAPSULE | Freq: Two times a day (BID) | ORAL | 0 refills | Status: DC

## 2023-11-08 NOTE — Progress Notes (Signed)
 Discussed results over the phone with patient's daughter and she verbalized understanding.  Will cover with amoxicillin  500 mg twice daily for possible infection giving swelling present and overlying soft tissues.  Patient does have a follow-up with ENT on 11/12/2023 for siloadenitis with numerous stones and will keep this appointment for follow-up.  I recommend that she reach out to her PCP regarding possible Prolia  side effects and she verbalized understanding.

## 2023-11-08 NOTE — Telephone Encounter (Signed)
 Copied from CRM [id number]. Topic: Clinical - Lab/Test Results >> Nov 08, 2023  3:40 PM Renea ORN wrote: Patient wanted to get her Imaging Results.. Please call [phone].     Patient was called by Thersia and she went over the results of pt's CT maxillofacial with her.

## 2023-11-08 NOTE — Telephone Encounter (Signed)
  Chief Complaint: jaw swelling Symptoms: swelling, itching Frequency: since this AM Pertinent Negatives: Patient denies SOB, chest pain, tongue swelling, injuries Disposition: [] ED /[] Urgent Care (no appt availability in office) / [x] Appointment(In office/virtual)/ []  [facility]/ [] Home Care/ [] Refused Recommended Disposition /[] [facility]/ []  Follow-up with PCP Additional Notes: Patient daughter calling c/o L jaw swelling noticed when pt woke up this AM. Pt denies SOB, chest pain, tongue swelling, injuries. Endorses mild pain and reports that she just started Prolia  injection yesterday. They have concerns with possible drug reaction, as the daughter states jaw swelling was listed on side effects. Scheduled patient per protocol on 11/08/2023 at [facility]. Caregiver verbalized understanding and to call back with worsening symptoms.      Copied from CRM [id number]. Topic: Clinical - Red Word Triage >> Nov 08, 2023 12:29 PM Drema MATSU wrote: Red Word that prompted transfer to Nurse Triage: Patient had a Prolia  injection yesterday and when she woke up this morning the left side of her jaw was swollen. Reason for Disposition  Face swelling began after taking a drug  Answer Assessment - Initial Assessment Questions 1. ONSET: When did the swelling start? (e.g., minutes, hours, days)     Just woke up and it was swollen 2. LOCATION: What part of the face is swollen?     L side of jaw 3. SEVERITY: How swollen is it?     Double size compared to other, you can notice it 4. ITCHING: Is there any itching? If Yes, ask: How much?   (Scale 1-10; mild, moderate or severe)     yes 5. PAIN: Is the swelling painful to touch? If Yes, ask: How painful is it?   (Scale 1-10; mild, moderate or severe)   - NONE (0): no pain   - MILD (1-3): doesn't interfere with normal activities    - MODERATE (4-7): interferes with normal activities or awakens from sleep    - SEVERE (8-10):  excruciating pain, unable to do any normal activities      Mild pain 6. FEVER: Do you have a fever? If Yes, ask: What is it, how was it measured, and when did it start?      no 7. CAUSE: What do you think is causing the face swelling?     Maybe Prolia  injection 8. RECURRENT SYMPTOM: Have you had face swelling before? If Yes, ask: When was the last time? What happened that time?     No, first time getting shot 9. OTHER SYMPTOMS: Do you have any other symptoms? (e.g., toothache, leg swelling)     No, just nose is stopped up  Protocols used: Face Swelling-A-AH

## 2023-11-08 NOTE — Progress Notes (Signed)
 Acute Care Office Visit  Subjective:   [name] 06/14/1936 11/08/2023  Chief Complaint  Patient presents with   Facial Swelling    Patient has been having problems with jaw swelling which she noticed this morning. States she received a prolia  injection yesterday 2/5.    HPI: [name] is a 88 year old female with a history of CKD, HTN, Osteoporosis, diverticulitis, and DM presenting with her family member for concern of new onset swelling and left jaw pain. Patient reports she just received her first injection of Prolia  subcutaneously on 11/07/23. She states swelling, jaw pain and fever/chills began this morning. She reports moderate pain with palpation to left side of face, jaw, and neck. Pt reports limited ROM due to pain.   Pt has history of submandibular swelling per chart review with diagnosis of Left submandibular sialoliths on 09/14/23 with US  soft tissue head and neck. Patient had visit on 10/31/23 with ENT for salivary gland stone to left SMG duct and difficulty swallowing. Her exam was unremarkable per chart review and was recommended to follow up as needed and obtain barium swallow study. Her family member states that swelling has never been palpable or visible until today.    The following portions of the patient's history were reviewed and updated as appropriate: past medical history, past surgical history, family history, social history, allergies, medications, and problem list.   Patient Active Problem List   Diagnosis Date Noted   Acute bronchitis 05/03/2023   Acute bacterial rhinosinusitis 05/03/2023   Osteoarthritis of right knee 07/04/2022   CKD (chronic kidney disease) stage 4, GFR 15-29 ml/min (HCC) 06/27/2022   Mitral stenosis 04/17/2022   Acute kidney injury superimposed on chronic kidney disease (HCC) 01/02/2022   NSTEMI (non-ST elevated myocardial infarction) (HCC) 12/23/2021   Osteoarthritis of knee 03/30/2021   Bilateral hand pain 03/08/2021    Complete heart block (HCC) 12/13/2020   Pacemaker - MDT 12/13/2020   DOE (dyspnea on exertion) 07/29/2020   Encntr for surgical aftcr following surgery on the circ sys 07/26/2020   Hypertensive heart and chronic kidney disease with heart failure and stage 1 through stage 4 chronic kidney disease, or unspecified chronic kidney disease (HCC) 07/26/2020   Pain in right knee 03/17/2020   Chronic combined systolic (congestive) and diastolic (congestive) heart failure (HCC)    DKA (diabetic ketoacidosis) (HCC) 12/20/2019   Diverticulosis 12/10/2019   Age-related osteoporosis without current pathological fracture 10/03/2019   Chronic kidney disease, stage 3 unspecified (HCC) 10/03/2019   Long term (current) use of insulin  (HCC) 10/03/2019   Hyperlipidemia, unspecified 10/03/2019   Long term (current) use of inhaled steroids 10/03/2019   Shortness of breath 01/30/2018   Acute bronchiolitis 12/17/2017   Wheezing 10/29/2017   Vitamin D  deficiency 07/31/2017   Renal insufficiency 07/31/2017   Knee pain 06/01/2017   Morbid obesity due to excess calories (HCC) 01/25/2017   Diabetes (HCC) 04/12/2016   Cough 01/20/2015   Atrial fibrillation (HCC) 05/28/2014   History of colonic polyps 05/28/2014   Vomiting 01/29/2014   CAD (coronary artery disease) 09/01/2013   Routine general medical examination at a health care facility 07/17/2011   Encounter for long-term (current) use of other medications 07/06/2011   Nonspecific (abnormal) findings on radiological and other examination of body structure 11/09/2009   IRRITABLE BOWEL SYNDROME 05/07/2009   CHEST PAIN 05/07/2009   ADNEXAL MASS, RIGHT 07/22/2008   HEMORRHOIDS, RECURRENT 01/27/2008   Diverticulitis 01/27/2008   OTH ABNORMAL FIND  RAD EXAMINATION BREAST 01/27/2008   GERD 01/13/2008   UTI 09/28/2007   Dyslipidemia 05/27/2007   ANEMIA-IRON DEFICIENCY 05/27/2007   ANXIETY 05/27/2007   Essential hypertension 05/27/2007   Seasonal allergic  rhinitis 05/27/2007   Cough variant asthma 05/27/2007   Osteoporosis 05/27/2007   DIVERTICULITIS, HX OF 05/27/2007   Past Medical History:  Diagnosis Date   Allergic rhinitis    Anterior chest wall pain    Anxiety    Asthma    Chronic diastolic CHF (congestive heart failure) (HCC)    Echo 01/2020: EF 60-65, normal wall motion, mild LVH, normal RV SF, RVSP 52.6 (moderate elevation), severe LAE, moderate RAE, trivial MR, mild MS (mean gradient 5.5 mmHg), mild aortic valve sclerosis (no AS); elevated E/e' c/w elevated LVEDP   Cough    DM type 2 (diabetes mellitus, type 2) (HCC)    GERD (gastroesophageal reflux disease)    History of diverticulitis of colon    HTN (hypertension)    Hyperlipidemia    IBS (irritable bowel syndrome)    Iron deficiency anemia    Mitral stenosis 04/17/2022   Echo 04/2022: EF 55-60, no RWMA, mild LVH, normal RVSF, normal PASP, moderate LAE, moderate mitral stenosis (mean gradient 4 mmHg), trivial MR, trivial AI, AV sclerosis without stenosis   Osteoporosis, unspecified    Renal insufficiency 07/31/2017   UTI (urinary tract infection)    Past Surgical History:  Procedure Laterality Date   CARDIOVASCULAR STRESS TEST  02/25/04   CORONARY BALLOON ANGIOPLASTY N/A 12/28/2021   Procedure: CORONARY BALLOON ANGIOPLASTY;  Surgeon: Verlin Lonni BIRCH, MD;  Location: [facility] INVASIVE CV LAB;  Service: Cardiovascular;  Laterality: N/A;   CORONARY STENT INTERVENTION N/A 12/28/2021   Procedure: CORONARY STENT INTERVENTION;  Surgeon: Verlin Lonni BIRCH, MD;  Location: [facility] INVAS[facility] CV LAB;  Service: Cardiovascular;  Laterality: N/A;   ESOPHAGOGASTRODUODENOSCOPY  12/26/01   PACEMAKER IMPLANT N/A 08/02/2020   Procedure: PACEMAKER IMPLANT;  Surgeon: Fernande Elspeth BROCKS, MD;  Location: [facility] INVASIVE CV LAB;  Service: Cardiovascular;  Laterality: N/A;   RIGHT OOPHORECTOMY  jan 2010   RIGHT/LEFT HEART CATH AND CORONARY ANGIOGRAPHY N/A 12/28/2021   Procedure: RIGHT/LEFT HEART CATH AND  CORONARY ANGIOGRAPHY;  Surgeon: Verlin Lonni BIRCH, MD;  Location: [facility] INVASIVE CV LAB;  Service: Cardiovascular;  Laterality: N/A;   Family History  Problem Relation Age of Onset   Hypertension Mother    Stroke Mother    Other Father        poor circulation   Cancer Sister    Lung cancer Brother    Melanoma Brother    Diabetes Daughter    Colon cancer Neg Hx    Liver disease Neg Hx    Esophageal cancer Neg Hx    Outpatient Medications Prior to Visit  Medication Sig Dispense Refill   acetaminophen  (TYLENOL ) 325 MG tablet Take 650 mg by mouth every 6 (six) hours as needed for pain.     albuterol  (PROAIR  HFA) 108 (90 Base) MCG/ACT inhaler 2 puffs every 4 hours as needed only  if your can't catch your breath 18 g 11   albuterol  (PROVENTIL ) (2.5 MG/3ML) 0.083% nebulizer solution Take 3 mLs (2.5 mg total) by nebulization every 6 (six) hours as needed for wheezing or shortness of breath. 150 mL 1   Ascorbic Acid (VITAMIN C) 1000 MG tablet Take 1,000 mg by mouth daily.     atorvastatin  (LIPITOR ) 80 MG tablet TAKE 1 TABLET BY MOUTH EVERY DAY 90 tablet 3   azelastine  (  ASTELIN ) 0.1 % nasal spray Place 2 sprays into both nostrils 2 (two) times daily as needed for allergies. Use in each nostril as directed 30 mL 5   BD VEO INSULIN  SYRINGE U/F 31G X 15/64 0.5 ML MISC USE AS INSTRUCTED 100 each 3   benzonatate  (TESSALON ) 200 MG capsule Take 1 capsule (200 mg total) by mouth 3 (three) times daily as needed. 30 capsule 1   Blood Glucose Monitoring Suppl (ONETOUCH VERIO FLEX SYSTEM) w/Device KIT USE AS DIRECTED 1 kit .   budesonide -formoterol  (SYMBICORT ) 160-4.5 MCG/ACT inhaler TAKE 2 PUFFS FIRST THING IN AM AND THEN ANOTHER 2 PUFFS ABOUT 12 HOURS LATER. 30.6 each 6   carvedilol  (COREG ) 3.125 MG tablet TAKE 1 TABLET BY MOUTH TWICE A DAY WITH A MEAL 180 tablet 0   cetirizine  (ZYRTEC  ALLERGY ) 10 MG tablet Take 1 tablet (10 mg total) by mouth daily. 30 tablet 5   cholecalciferol (VITAMIN D3) 25 MCG  (1000 UNIT) tablet Take 1,000 Units by mouth daily. Isn't taking regularly     clopidogrel  (PLAVIX ) 75 MG tablet TAKE 1 TABLET BY MOUTH DAILY WITH BREAKFAST. 90 tablet 2   denosumab  (PROLIA ) 60 MG/ML SOSY injection Inject 60 mg into the skin every 6 (six) months. Dx code: M81.0.  Pt has appointment on 11/07/23 1 mL 0   diclofenac  Sodium (VOLTAREN ) 1 % GEL Apply 4 g topically 4 (four) times daily as needed (pain).     ELIQUIS  2.5 MG TABS tablet TAKE 1 TABLET BY MOUTH TWICE A DAY 60 tablet 5   famotidine  (PEPCID ) 20 MG tablet TAKE 1 TABLET BY MOUTH TWICE A DAY 180 tablet 1   FARXIGA  5 MG TABS tablet TAKE 1 TABLET BY MOUTH EVERY DAY BEFORE BREAKFAST 30 tablet 3   fluticasone  (FLONASE ) 50 MCG/ACT nasal spray Place 2 sprays into both nostrils daily. 16 g 5   furosemide  (LASIX ) 40 MG tablet TAKE 1 TABLET BY MOUTH TWICE A DAY 180 tablet 3   glucose blood (ONETOUCH VERIO) test strip 1 each by Other route 2 (two) times daily. And lancets 2/day 200 each 3   glucose blood test strip Check blood sugar twice a day 100 each 12   hydrALAZINE  (APRESOLINE ) 25 MG tablet Take 3 tablets (75 mg total) by mouth 3 (three) times daily. 810 tablet 3   insulin  lispro (HUMALOG ) 100 UNIT/ML injection Give 3 units with BREAKFAST, AND 18 units with SUPPER 60 mL 1   latanoprost  (XALATAN ) 0.005 % ophthalmic solution Place 1 drop into both eyes at bedtime.     montelukast  (SINGULAIR ) 10 MG tablet TAKE 1 TABLET BY MOUTH EVERYDAY AT BEDTIME 90 tablet 1   nitroGLYCERIN  (NITROSTAT ) 0.4 MG SL tablet Place 1 tablet (0.4 mg total) under the tongue every 5 (five) minutes x 3 doses as needed for chest pain. 25 tablet 2   pantoprazole  (PROTONIX ) 40 MG tablet Take 1 tablet (40 mg total) by mouth daily. 180 tablet 3   potassium chloride  (KLOR-CON ) 10 MEQ tablet TAKE 1 TABLET BY MOUTH EVERY DAY 90 tablet 1   promethazine -dextromethorphan (PROMETHAZINE -DM) 6.25-15 MG/5ML syrup TAKE 5 MLS BY MOUTH 4 TIMES A DAY AS NEEDED FOR COUGH 240 mL 0    sodium bicarbonate 650 MG tablet Take 650 mg by mouth 2 (two) times daily.     Syringe/Needle, Disp, (SYRINGE 3CC/25GX1) 25G X 1 3 ML MISC Use to inject into the skin 2x a day 100 each 3   vitamin B-12 (CYANOCOBALAMIN ) 100 MCG tablet Take 100  mcg by mouth daily.     traZODone  (DESYREL ) 50 MG tablet Take 0.5 tablets (25 mg total) by mouth at bedtime as needed for sleep. (Patient not taking: Reported on 11/08/2023) 15 tablet 0   ondansetron  (ZOFRAN ) 8 MG tablet Take 1 tablet (8 mg total) by mouth every 8 (eight) hours as needed for nausea or vomiting. 20 tablet 0   predniSONE  (DELTASONE ) 20 MG tablet Take 1 tablet (20 mg total) by mouth daily with breakfast. (Patient not taking: Reported on 10/31/2023) 5 tablet 0   Facility-Administered Medications Prior to Visit  Medication Dose Route Frequency Provider Last Rate Last Admin   [START ON 04/05/2024] denosumab  (PROLIA ) injection 60 mg  60 mg Subcutaneous Once Jason Leita Repine, FNP       Allergies  Allergen Reactions   Aspirin  Shortness Of Breath and Other (See Comments)    Caused asthma symptoms   Ramipril Cough   Doxycycline      Nausea     ROS: A complete ROS was performed with pertinent positives/negatives noted in the HPI. The remainder of the ROS are negative.    Objective:   Today's Vitals   11/08/23 1322  BP: (!) 140/72  Pulse: 71  SpO2: 100%  Weight: 176 lb 6.4 oz (80 kg)  Height: 5' 6 (1.676 m)    GENERAL: Well-appearing, in NAD. Well nourished.  SKIN: Pink, warm and dry. No rash, lesion, ulceration, or ecchymoses.  Head: Normocephalic. NECK: Trachea midline. Full ROM w/o pain or tenderness. Significant swelling and induration present to left submandibular area and left neck aspect with tenderness. Mild swelling to left jaw and tenderness with palpation. Negative clicking, popping of jaw.Decreased ROM in opening, closing of jaw due to pain.   EARS: Tympanic membranes are intact, translucent without bulging and without  drainage. Appropriate landmarks visualized.  EYES: Conjunctiva clear without exudates. EOMI, PERRL, no drainage present.  NOSE: Septum midline w/o deformity. Nares patent, mucosa pink and non-inflamed w/o drainage. No sinus tenderness.  THROAT: Uvula midline. Oropharynx clear.  Mucous membranes pink and moist. Mild swelling to left gingival area of lower mouth. Poor dentition. RESPIRATORY: Chest wall symmetrical. Respirations even and non-labored. Breath sounds clear to auscultation bilaterally.  MSK: Muscle tone and strength appropriate for age. EXTREMITIES: Without clubbing, cyanosis, or edema.  NEUROLOGIC: No motor or sensory deficits. Steady, even gait. C2-C12 intact.  PSYCH/MENTAL STATUS: Alert, oriented x 3. Cooperative, appropriate mood and affect.      Assessment & Plan:  1. Jaw swelling (Primary) 2. Arthralgia of left temporomandibular joint Possible adverse reaction of Prolia  with concern versus abscess formation w/ hx of poor dentition and sialoliths. Will obtain CT Maxillofacial to rule out possible infection, necrosis, abscess formation without contrast due to CKD. Will also obtain labs STAT for possible infection given pain, fever, and worsened swelling. Pending CT, will send appropriate abx therapy.   - CBC with Differential - Basic Metabolic Panel (BMET) - Lactic acid, plasma - CT MAXILLOFACIAL WO CONTRAST; Future  No orders of the defined types were placed in this encounter.  Lab Orders         CBC with Differential         Basic Metabolic Panel (BMET)         Lactic acid, plasma      Return if symptoms worsen or fail to improve.    Patient to reach out to office if new, worrisome, or unresolved symptoms arise or if no improvement in patient's condition. Patient verbalized understanding  and is agreeable to treatment plan. All questions answered to patient's satisfaction.    Thersia Schuyler Stark, [place]

## 2023-11-08 NOTE — Patient Instructions (Signed)
 Go to 1st Floor- Imaging for the CT Scan   I will call with results.   Please talk with Dr. Gordy Lauber about Prolia  and possible osteonecrosis /jaw concerns.

## 2023-11-08 NOTE — Addendum Note (Signed)
 Addended by: Solace Wendorff on: 11/08/2023 03:54 PM   Modules accepted: Orders

## 2023-11-09 LAB — CBC WITH DIFFERENTIAL/PLATELET
Basophils Absolute: 0.1 10*3/uL (ref 0.0–0.2)
Basos: 1 %
EOS (ABSOLUTE): 0.2 10*3/uL (ref 0.0–0.4)
Eos: 2 %
Hematocrit: 40.5 % (ref 34.0–46.6)
Hemoglobin: 13.6 g/dL (ref 11.1–15.9)
Immature Grans (Abs): 0.1 10*3/uL (ref 0.0–0.1)
Immature Granulocytes: 1 %
Lymphocytes Absolute: 1 10*3/uL (ref 0.7–3.1)
Lymphs: 9 %
MCH: 29.5 pg (ref 26.6–33.0)
MCHC: 33.6 g/dL (ref 31.5–35.7)
MCV: 88 fL (ref 79–97)
Monocytes Absolute: 1.2 10*3/uL — ABNORMAL HIGH (ref 0.1–0.9)
Monocytes: 11 %
Neutrophils Absolute: 8.5 10*3/uL — ABNORMAL HIGH (ref 1.4–7.0)
Neutrophils: 76 %
Platelets: 179 10*3/uL (ref 150–450)
RBC: 4.61 x10E6/uL (ref 3.77–5.28)
RDW: 14.9 % (ref 11.7–15.4)
WBC: 10.9 10*3/uL — ABNORMAL HIGH (ref 3.4–10.8)

## 2023-11-09 LAB — BASIC METABOLIC PANEL
BUN/Creatinine Ratio: 13 (ref 12–28)
BUN: 30 mg/dL — ABNORMAL HIGH (ref 8–27)
CO2: 18 mmol/L — ABNORMAL LOW (ref 20–29)
Calcium: 9 mg/dL (ref 8.7–10.3)
Chloride: 103 mmol/L (ref 96–106)
Creatinine, Ser: 2.3 mg/dL — ABNORMAL HIGH (ref 0.57–1.00)
Glucose: 282 mg/dL — ABNORMAL HIGH (ref 70–99)
Potassium: 4.6 mmol/L (ref 3.5–5.2)
Sodium: 141 mmol/L (ref 134–144)
eGFR: 20 mL/min/{1.73_m2} — ABNORMAL LOW (ref >59)

## 2023-11-09 LAB — LACTIC ACID, PLASMA: Lactate, Ven: 48.3 mg/dL (ref 4.8–25.7)

## 2023-11-12 ENCOUNTER — Ambulatory Visit (INDEPENDENT_AMBULATORY_CARE_PROVIDER_SITE_OTHER): Payer: 59 | Admitting: Otolaryngology

## 2023-11-12 ENCOUNTER — Encounter (INDEPENDENT_AMBULATORY_CARE_PROVIDER_SITE_OTHER): Payer: Self-pay | Admitting: Otolaryngology

## 2023-11-12 VITALS — BP 128/72 | HR 83

## 2023-11-12 DIAGNOSIS — H9313 Tinnitus, bilateral: Secondary | ICD-10-CM | POA: Diagnosis not present

## 2023-11-12 DIAGNOSIS — K115 Sialolithiasis: Secondary | ICD-10-CM | POA: Diagnosis not present

## 2023-11-12 DIAGNOSIS — K1123 Chronic sialoadenitis: Secondary | ICD-10-CM

## 2023-11-12 DIAGNOSIS — K224 Dyskinesia of esophagus: Secondary | ICD-10-CM | POA: Diagnosis not present

## 2023-11-12 DIAGNOSIS — K219 Gastro-esophageal reflux disease without esophagitis: Secondary | ICD-10-CM | POA: Diagnosis not present

## 2023-11-12 DIAGNOSIS — Z794 Long term (current) use of insulin: Secondary | ICD-10-CM | POA: Diagnosis not present

## 2023-11-12 DIAGNOSIS — H6123 Impacted cerumen, bilateral: Secondary | ICD-10-CM | POA: Diagnosis not present

## 2023-11-12 DIAGNOSIS — E119 Type 2 diabetes mellitus without complications: Secondary | ICD-10-CM

## 2023-11-12 DIAGNOSIS — R1310 Dysphagia, unspecified: Secondary | ICD-10-CM | POA: Diagnosis not present

## 2023-11-12 MED ORDER — AMOXICILLIN-POT CLAVULANATE 875-125 MG PO TABS: 1.0000 | ORAL_TABLET | Freq: Two times a day (BID) | ORAL | 0 refills | Status: DC

## 2023-11-12 MED ORDER — METHYLPREDNISOLONE 4 MG PO TBPK: ORAL_TABLET | ORAL | 1 refills | Status: DC

## 2023-11-12 NOTE — Progress Notes (Signed)
 ENT Progress Note:  Update 11/12/23:  Discussed the use of AI scribe software for clinical note transcription with the patient, who gave verbal consent to proceed.  History of Present Illness   [name] is an 88 year old female who presents with ringing in the left > right ear for years and swelling in the left salivary gland x 5 days. She was referred by Dr. Ike Malady.  She experiences chronic tinnitus in both ears, worse on the left for several years, which has worsened recently. There is no associated ear pain, history of ear infections, or prior ear surgeries. A previous hearing test indicated normal hearing, and she has never been advised to use hearing aids. No dizziness/vertigo.  Swelling in the salivary gland began on following a Prolia  injection on Wednesday, 5 days ago. A doctor on Friday attributed the swelling to salivary gland stones, based on results of CT max/face which showed multiple salivary stones in the left SMG gland and duct. She started amoxicillin  on Friday, but it has not alleviated her symptoms. The pain is described as a 'little nagging pain' with a severity of 4 out of 10. She has no prior history of this issue.  She has diabetes managed with insulin  but does not frequently monitor her blood sugar levels.  No history of smoking or heavy alcohol use.     Initial evaluation 10/31/23 Reason for Consult: concern for salivary gland stones and choking when drinking   HPI: Discussed the use of AI scribe software for clinical note transcription with the patient, who gave verbal consent to proceed.  History of Present Illness   The patient is an 62 yoF presents with throat irritation and choking on liquids. She was referred by Dr. Ike Malady for evaluation of salivary stones as well.   Throat irritation and choking on liquids have been ongoing for several years. She experiences choking when drinking water but not with solid foods, although she eats slowly. Occasional pain when  swallowing and hoarseness are also reported. A swallow test was conducted due to these symptoms (esophagram) with evidence of dysmotility and corkscrew lower esophageal configuration suggestive of esophageal spasm. She also had small hiatal hernia.   She reports being told she has submandibular stones. Denies hx of submandibular gland swelling, sialoadenitis requiring antibiotics or visits to ED/PCP.   She has a history of diabetes and uses insulin  for management. She also takes pantoprazole  for reflux and occasionally uses a nasal spray, Astelin . She is currently on a blood thinner, Eliquis , and another unspecified medication.  She has a history of asthma and uses an inhaler. She occasionally takes prednisone  as needed for breathing difficulties. She has a history of smoking but quit a long time ago. No diagnosis of COPD or emphysema is present.  There is a history of pneumonia, but it occurred a long time ago. No recent weight changes or strokes are reported.  She had a heart attack over a year ago, treated with a stent placement.     Records Reviewed:  ED visit 08/31/23 [name] is a 88 y.o. female with h/o hypertension, CAD, A-fib on Eliquis , diabetes, hypertension presents to the emergency department today for evaluation of left arm tingling for the past few days.  Reports is intermittent without any exacerbating or relieving factors.  She is not having any chest pain or shortness of breath.  She does not feel weak in the arm.  She denies any numbness or loss of sensation into it.  She denies any recent injury. Denies any swelling to the area.  She reports that she has had a heart attack before and was concerned that she was having one again, but did not feel similar to her previous one. Allergic to ASA.  88 y.o. female presents to the ER for evaluation of Left arm tingling intermittently. Differential diagnosis includes but is not limited to cervical radiculopathy, ACS, MSK. Vital signs  Mildly elevated BP, borderline bradycardia, otherwise unremarkable. Physical exam as noted above.    CT scan of the head shows 1. No acute intracranial process. 2. 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 Per radiologist's interpretation.     CT cervical spine shows  1. No acute displaced fracture or traumatic listhesis of the cervical spine. 2. Please see separately dictated CT head 08/31/2023. 3. Left submandibular gland calcifications.    I independently reviewed and interpreted the patient's labs.  CBC shows chronic thrombocytopenia otherwise unremarkable.  No leukocytosis or anemia.  CMP shows glucose of 348 with a decrease in bicarb of 17 however closed anion gap.  Creatinine at baseline.  Sodium mildly low at 134 however likely pseudohyponatremia.  No other electrolyte or LFT abnormality.  Troponin at 21 which is her baseline.  Repeat was at 28.   The patient does have a decrease in bicarb at 17, but has a closed gap. She did not give herself her insulin  this morning. Encourage her to remember to take her nightly dose and to be checking her BS more often. Doubt DKA. Recommended fluid and diet changes.    EKG reviewed and interpreted by my attending and read as Ventricular-paced rhythm.   She is not having any chest pain or SOB. She does not feel weak in the arm. She reports she has feeling in the arm, it just feels tingling/pins & needles. Troponins are at the baseline. She is well appearing. Arm symptoms are not reproduced with movement of the head/neck. There are now overlying skin changes. Coloration, size, and temperature appear and feel symmetric. Equal grip strength. Strength is 5/5 in the patient's bilateral upper extremities. CT head and neck do not shows any signs of acute stroke. 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 She is likely having some radiculopathy from the arthritis in her neck. My attending assessed at bedside and agrees. Will  discharge home. Recommend Tylenol  and lidocaine  patches and following up with PCP.     Past Medical History:  Diagnosis Date   Allergic rhinitis    Anterior chest wall pain    Anxiety    Asthma    Chronic diastolic CHF (congestive heart failure) (HCC)    Echo 01/2020: EF 60-65, normal wall motion, mild LVH, normal RV SF, RVSP 52.6 (moderate elevation), severe LAE, moderate RAE, trivial MR, mild MS (mean gradient 5.5 mmHg), mild aortic valve sclerosis (no AS); elevated E/e' c/w elevated LVEDP   Cough    DM type 2 (diabetes mellitus, type 2) (HCC)    GERD (gastroesophageal reflux disease)    History of diverticulitis of colon    HTN (hypertension)    Hyperlipidemia    IBS (irritable bowel syndrome)    Iron deficiency anemia    Mitral stenosis 04/17/2022   Echo 04/2022: EF 55-60, no RWMA, mild LVH, normal RVSF, normal PASP, moderate LAE, moderate mitral stenosis (mean gradient 4 mmHg), trivial MR, trivial AI, AV sclerosis without stenosis   Osteoporosis, unspecified    Renal insufficiency 07/31/2017   UTI (urinary tract  infection)     Past Surgical History:  Procedure Laterality Date   CARDIOVASCULAR STRESS TEST  02/25/04   CORONARY BALLOON ANGIOPLASTY N/A 12/28/2021   Procedure: CORONARY BALLOON ANGIOPLASTY;  Surgeon: Odie Benne, MD;  Location: [facility] INVASIVE CV LAB;  Service: Cardiovascular;  Laterality: N/A;   CORONARY STENT INTERVENTION N/A 12/28/2021   Procedure: CORONARY STENT INTERVENTION;  Surgeon: Odie Benne, MD;  Location: [facility] INVASIVE CV LAB;  Service: Cardiovascular;  Laterality: N/A;   ESOPHAGOGASTRODUODENOSCOPY  12/26/01   PACEMAKER IMPLANT N/A 08/02/2020   Procedure: PACEMAKER IMPLANT;  Surgeon: Verona Goodwill, MD;  Location: [facility] INVASIVE CV LAB;  Service: Cardiovascular;  Laterality: N/A;   RIGHT OOPHORECTOMY  jan 2010   RIGHT/LEFT HEART CATH AND CORONARY ANGIOGRAPHY N/A 12/28/2021   Procedure: RIGHT/LEFT HEART CATH AND CORONARY ANGIOGRAPHY;  Surgeon:  Odie Benne, MD;  Location: [facility] INVASIVE CV LAB;  Service: Cardiovascular;  Laterality: N/A;    Family History  Problem Relation Age of Onset   Hypertension Mother    Stroke Mother    Other Father        poor circulation   Cancer Sister    Lung cancer Brother    Melanoma Brother    Diabetes Daughter    Colon cancer Neg Hx    Liver disease Neg Hx    Esophageal cancer Neg Hx     Social History:  reports that she quit smoking about 37 years ago. Her smoking use included cigarettes. She started smoking about 42 years ago. She has a 1.5 pack-year smoking history. She has never used smokeless tobacco. She reports that she does not drink alcohol and does not use drugs.  Allergies:  Allergies  Allergen Reactions   Aspirin  Shortness Of Breath and Other (See Comments)    Caused asthma symptoms   Ramipril Cough   Doxycycline      Nausea    Medications: I have reviewed the patient's current medications.  The PMH, PSH, Medications, Allergies, and SH were reviewed and updated.  ROS: Constitutional: Negative for fever, weight loss and weight gain. Cardiovascular: Negative for chest pain and dyspnea on exertion. Respiratory: Is not experiencing shortness of breath at rest. Gastrointestinal: Negative for nausea and vomiting. Neurological: Negative for headaches. Psychiatric: The patient is not nervous/anxious  Blood pressure 128/72, pulse 83, SpO2 97%.  PHYSICAL EXAM:  Exam: General: Well-developed, well-nourished Respiratory Respiratory effort: Equal inspiration and expiration without stridor Cardiovascular Peripheral Vascular: Warm extremities with equal color/perfusion Eyes: No nystagmus with equal extraocular motion bilaterally Neuro/Psych/Balance: Patient oriented to person, place, and time; Appropriate mood and affect; Gait is intact with no imbalance; Cranial nerves I-XII are intact Head and Face Inspection: Normocephalic and atraumatic without mass or  lesion Palpation: Facial skeleton intact without bony stepoffs Salivary Glands: Swelling and mild tenderness of the left SMG gland, palpable stone along the left Wharton's duct near the opening with mild tenderness.  Facial Strength: Facial motility symmetric and full bilaterally ENT Pinna: External ear intact and fully developed External canal: Canals with cerumen and dry skin, cleared (see procedure note) Tympanic Membrane: Clear once cerumen dry skin removed External Nose: No scar or anatomic deformity Internal Nose: Septum is relatively straight. No polyp, or purulence. Mucosal edema and erythema present.  Bilateral inferior turbinate hypertrophy.  Lips, Teeth, and gums: Mucosa and teeth intact and viable TMJ: No pain to palpation  full mobility Oral cavity/oropharynx: No erythema or exudate, no lesions present Neck Neck and Trachea: Midline trachea without mass or  lesion Thyroid : No mass or nodularity Lymphatics: No lymphadenopathy  Procedure:  Procedure: Cerumen Removal, Bilateral (CPT [id number])  Diagnosis: cerumen impaction, bilateral   Informed consent: Timeout performed and informed consent was obtained.  Procedure: Operating microscope was employed to evaluate the ear(s).  Cerumen curette, speculum and suction were employed to clear the cerumen.   Findings: Normal appearing tympanic membranes without perforations, and external canals are normal after removal of cerumen.No middle ear fluid bilaterally.   Complications: None. Patient tolerated well.     Studies Reviewed: Neck U/S 09/14/23 CLINICAL DATA:  Left submandibular swelling   EXAM: ULTRASOUND OF HEAD/NECK SOFT TISSUES   TECHNIQUE: Ultrasound examination of the head and neck soft tissues was performed in the area of clinical concern.   COMPARISON:  CT 08/31/2023   FINDINGS: Left submandibular calcifications presumably sialoliths corresponding to previous findings on CT. No mass or adenopathy. Limited  contralateral images normal.   IMPRESSION: Left submandibular sialoliths.   Esophagram 06/22/23 IMPRESSION: 1. Presbyesophagus with tertiary contractions and corkscrew appearance in the mid and distal esophagus. The contractions and intermittent spasms limit our ability to assess for esophageal narrowings. 2. Initially substantially delayed emptying of the esophagus into the stomach in the upright position. Suspected component of esophageal spasm. Subsequently the esophagus did spontaneously empty into the stomach in the upright position. 3. Small type 1 hiatal hernia. 4. 13 mm barium tablet impacted just above the hiatal hernia.  CT max/face w/o contrast 11/08/23 FINDINGS: Osseous: No acute fracture or destructive process.   Orbits: Bilateral cataract extraction.   Sinuses: Minimal debris in the left sphenoid sinus. Trace right mastoid fluid.   Soft tissues: Asymmetrically enlarged left submandibular gland which appears edematous with mild swelling in the overlying soft tissues. Numerous calculi throughout the left submandibular gland and throughout the left submandibular duct including a 15 x 7 mm calculus in the distal aspect of the duct. Unremarkable noncontrast appearance of the right submandibular and both parotid glands.   Limited intracranial: Calcified atherosclerosis of the carotid siphons and vertebral arteries.   IMPRESSION: Left submandibular sialoadenitis with numerous stones.      Assessment/Plan: Encounter Diagnoses  Name Primary?   Tinnitus of both ears    Sialolith    Dysphagia, unspecified type    Esophageal dysmotility    Chronic GERD    Chronic sialoadenitis Yes     Assessment and Plan    Chronic dysphagia with liquids Choking on water for several years, no issues with solid foods. Previous esophagram showed esophageal dysmotility, esophageal spasm, delayed emptying, and small hiatal hernia. Reflux history noted, on PPI currently. Scope exam  showed vocal cords moving well and no significant abnormalities at the level of the throat aside from changes c/w GERD LPR. Discussed the need for a modified barium swallow study to evaluate swallowing at the level of her oropharynx. If the study shows safe swallowing, swallow therapy may be considered. - Order modified barium swallow study - Continue pantoprazole  at current dose  - consider reflux gourmet  - det and lifestyle changes to minimize GERD   GERD LPR  Importance of continuing reflux medication discussed. - Continue pantoprazole  40 mg daily  - consider reflux gourmet  - det and lifestyle changes to minimize GERD   Salivary Gland Stone Large salivary gland stone visualized in the salivary duct (Left SMG duct). Likely present for a long time due to slow growth rate. No current symptoms of swelling, pain, or discomfort. Discussed that if asymptomatic, it may be best  to observe, especially considering  her age. If symptoms develop, removal can be considered. - Monitor for symptoms such as swelling, pain, or discomfort - Consider removal if symptoms develop  Asthma Uses inhaler and takes prednisone  as needed for breathing issues. - Continue current asthma management  Diabetes Mellitus On insulin  therapy. - Continue current diabetes management  Coronary Artery Disease Treated with stent placement after heart attack over a year ago. Currently on blood thinners (Eliquis ). - Continue Eliquis   Follow-up - Schedule modified barium swallow study - Follow-up appointment after swallow study to review results and determine next steps.      Update 11/12/23  Assessment and Plan  Chronic dysphagia with liquids Choking on water for several years, no issues with solid foods. Previous esophagram showed esophageal dysmotility, esophageal spasm, delayed emptying, and small hiatal hernia. Reflux history noted, on PPI currently. Scope exam showed vocal cords moving well and no significant  abnormalities at the level of the throat aside from changes c/w GERD LPR. Discussed the need for a modified barium swallow study to evaluate swallowing at the level of her oropharynx. If the study shows safe swallowing, swallow therapy may be considered. - Ordered modified barium swallow study - return after swallow study results are available - Continue pantoprazole  at current dose  - consider reflux gourmet  - det and lifestyle changes to minimize GERD   GERD LPR  Importance of continuing reflux medication discussed. - Continue pantoprazole  40 mg daily  - consider reflux gourmet  - det and lifestyle changes to minimize GERD      Sialolithiasis with Sialadenitis Swelling and pain in the left salivary gland x 5 days and multiple large salivary stones in L SMG and duct on CT max/face non-con, stone in the duct palpable on exam. Discussed medical management options including antibiotics, steroids, warm compresses, sour cough drops, and pain management. Surgical removal may be necessary if symptoms persist. Risks of steroids discussed due to diabetes, including potential hyperglycemia, and she will check her glucose more frequently. - Discontinue amoxicillin  - Prescribe Augmentin  x 10 days - Prescribe Medrol  Dosepak - Advise increased hydration - Recommend sour cough drops - Advise warm compresses and gland massage - Monitor blood sugar levels due to steroid use  Tinnitus Chronic left > right ear tinnitus, worsening over time. No associated ear pain. Previous hearing test was normal,  but done years ago. Discussed that tinnitus is often due to hearing loss, necessitating a hearing test to determine type and extent. - Order hearing test - Cleared earwax today prior to testing   Diabetes Mellitus Diabetes managed with insulin . Steroid use may elevate blood sugar levels. Emphasized the importance of monitoring blood sugar and adjusting insulin  dosage accordingly. - Monitor blood sugar levels  closely - Adjust insulin  dosage as needed  Follow-up - Schedule follow-up appointment in a few weeks - Reschedule throat follow-up to coincide with hearing test review.        Artice Last, MD Otolaryngology [facility] Phone: [phone] Fax: [phone]    11/12/2023, 8:49 AM

## 2023-11-12 NOTE — Progress Notes (Signed)
 Hi [name],  Thank you for following [name] on Monday. I wanted you to be aware of her recent labs as well. I know you reviewed her CT as well and changed her abx. I feel that the lactic acid is likely chronically elevated with her CKD. I saw that she is following up with you in a few weeks as well. I'll loop her PCP in as well. Thank you!

## 2023-11-13 ENCOUNTER — Ambulatory Visit (INDEPENDENT_AMBULATORY_CARE_PROVIDER_SITE_OTHER): Payer: 59 | Admitting: Podiatry

## 2023-11-13 DIAGNOSIS — Z91198 Patient's noncompliance with other medical treatment and regimen for other reason: Secondary | ICD-10-CM

## 2023-11-13 NOTE — Telephone Encounter (Signed)
Rx was received from pharmacy.

## 2023-11-13 NOTE — Progress Notes (Signed)
1. Failure to attend appointment with reason given    Patient canceled appointment.

## 2023-11-14 ENCOUNTER — Ambulatory Visit (INDEPENDENT_AMBULATORY_CARE_PROVIDER_SITE_OTHER): Payer: 59 | Admitting: Internal Medicine

## 2023-11-14 ENCOUNTER — Encounter: Payer: Self-pay | Admitting: Internal Medicine

## 2023-11-14 VITALS — BP 140/80 | HR 75 | Ht 66.0 in | Wt 180.0 lb

## 2023-11-14 DIAGNOSIS — R110 Nausea: Secondary | ICD-10-CM | POA: Diagnosis not present

## 2023-11-14 DIAGNOSIS — R1032 Left lower quadrant pain: Secondary | ICD-10-CM | POA: Diagnosis not present

## 2023-11-14 DIAGNOSIS — K5900 Constipation, unspecified: Secondary | ICD-10-CM | POA: Diagnosis not present

## 2023-11-14 DIAGNOSIS — R1310 Dysphagia, unspecified: Secondary | ICD-10-CM

## 2023-11-14 DIAGNOSIS — K449 Diaphragmatic hernia without obstruction or gangrene: Secondary | ICD-10-CM | POA: Diagnosis not present

## 2023-11-14 NOTE — Patient Instructions (Addendum)
You have been scheduled for a CT scan of the abdomen and pelvis at [facility], 1st floor Radiology. You are scheduled on 11/21/23 at 4 pm. You should arrive at 2 pm to drink contrast. Nothing to eat or drink 4 hours prior You may take any medications as prescribed with a small amount of water, if necessary. If you take any of the following medications: METFORMIN, GLUCOPHAGE, GLUCOVANCE, AVANDAMET, RIOMET, FORTAMET, ACTOPLUS MET, JANUMET, GLUMETZA or METAGLIP, you MAY be asked to HOLD this medication 48 hours AFTER the exam.   The purpose of you drinking the oral contrast is to aid in the visualization of your intestinal tract. The contrast solution may cause some diarrhea. Depending on your individual set of symptoms, you may also receive an intravenous injection of x-ray contrast/dye. Plan on being at [facility] for 45 minutes or longer, depending on the type of exam you are having performed.   If you have any questions regarding your exam or if you need to reschedule, you may call [facility] at [phone] between the hours of 8:00 am and 5:00 pm, Monday-Friday.   _______________________________________________________  If your blood pressure at your visit was 140/90 or greater, please contact your primary care physician to follow up on this.  _______________________________________________________  If you are age 86 or older, your body mass index should be between 23-30. Your Body mass index is 29.05 kg/m. If this is out of the aforementioned range listed, please consider follow up with your Primary Care Provider.  If you are age 18 or younger, your body mass index should be between 19-25. Your Body mass index is 29.05 kg/m. If this is out of the aformentioned range listed, please consider follow up with your Primary Care Provider.   ________________________________________________________  The [facility] providers would like to encourage you to use [facility] to communicate  with providers for non-urgent requests or questions.  Due to long hold times on the telephone, sending your provider a message by [facility] may be a faster and more efficient way to get a response.  Please allow 48 business hours for a response.  Please remember that this is for non-urgent requests.  _______________________________________________________  Due to recent changes in healthcare laws, you may see the results of your imaging and laboratory studies on MyChart before your provider has had a chance to review them.  We understand that in some cases there may be results that are confusing or concerning to you. Not all laboratory results come back in the same time frame and the provider may be waiting for multiple results in order to interpret others.  Please give [place] 48 hours in order for your provider to thoroughly review all the results before contacting the office for clarification of your results.   .sher Thank you for entrusting me with your care and for choosing [facility],  Dr. Eulah Pont

## 2023-11-14 NOTE — Progress Notes (Unsigned)
Chief Complaint: Dysphagia, LLQ ab pain  HPI : 88 year old female with history of HFpEF, A-fib on Eliquis, CAD on Plavix, DM, GERD, asthma, IBS, prior diverticulitis presents for follow up of dysphagia and LLQ ab pain  Patient presents with dysphagia for years. She states that dysphagia has been present since her EGD procedure in 2009.  Her dysphagia has been stable in severity.  Endorses dysphagia to both solids and liquids.  Dysphagia tends to occur in the bottom of her throat, and sometimes she has to regurgitate food back up after eating it.   Interval History:  She had a black stool on Sunday and she took Pepto Bismol that day. She has been trying to drink more water. She dropped from BID to every day dosing, which has not made a big difference. Endorses dysphagia. Ab pain is still about the same. Benefiber has helped with the constipation. She is taking Miralax most every day. Has occasional nausea but not bad.   She had a flare of the LLQ ab pain starting this past week. This is the same LLQ ab pain that she has had for a long time. This pain is not any worse than it has been in the past. She is still having one BM every 3-4 days. LLQ ab pain would get worse when she gets constipated. She takes Miralax a couple of times per week currently. Did not start on any fiber supplement.  She is scheduled to see ENT on the 1/29 for her dysphagia, which has been stable. She has had difficulty drinking large amounts of liquids due to her dysphagia.  Denies fevers.   Past Medical History:  Diagnosis Date   Allergic rhinitis    Anterior chest wall pain    Anxiety    Asthma    Chronic diastolic CHF (congestive heart failure) (HCC)    Echo 01/2020: EF 60-65, normal wall motion, mild LVH, normal RV SF, RVSP 52.6 (moderate elevation), severe LAE, moderate RAE, trivial MR, mild MS (mean gradient 5.5 mmHg), mild aortic valve sclerosis (no AS); elevated E/e' c/w elevated LVEDP   Cough    DM type 2  (diabetes mellitus, type 2) (HCC)    GERD (gastroesophageal reflux disease)    History of diverticulitis of colon    HTN (hypertension)    Hyperlipidemia    IBS (irritable bowel syndrome)    Iron deficiency anemia    Mitral stenosis 04/17/2022   Echo 04/2022: EF 55-60, no RWMA, mild LVH, normal RVSF, normal PASP, moderate LAE, moderate mitral stenosis (mean gradient 4 mmHg), trivial MR, trivial AI, AV sclerosis without stenosis   Osteoporosis, unspecified    Renal insufficiency 07/31/2017   UTI (urinary tract infection)      Past Surgical History:  Procedure Laterality Date   CARDIOVASCULAR STRESS TEST  02/25/04   CORONARY BALLOON ANGIOPLASTY N/A 12/28/2021   Procedure: CORONARY BALLOON ANGIOPLASTY;  Surgeon: Kathleene Hazel, MD;  Location: [facility];  Service: Cardiovascular;  Laterality: N/A;   CORONARY STENT INTERVENTION N/A 12/28/2021   Procedure: CORONARY STENT INTERVENTION;  Surgeon: Kathleene Hazel, MD;  Location: [facility];  Service: Cardiovascular;  Laterality: N/A;   ESOPHAGOGASTRODUODENOSCOPY  12/26/01   PACEMAKER IMPLANT N/A 08/02/2020   Procedure: PACEMAKER IMPLANT;  Surgeon: Duke Salvia, MD;  Location: [facility];  Service: Cardiovascular;  Laterality: N/A;   RIGHT OOPHORECTOMY  jan 2010   RIGHT/LEFT HEART CATH AND CORONARY ANGIOGRAPHY N/A 12/28/2021   Procedure:  RIGHT/LEFT HEART CATH AND CORONARY ANGIOGRAPHY;  Surgeon: Kathleene Hazel, MD;  Location: [facility];  Service: Cardiovascular;  Laterality: N/A;   Family History  Problem Relation Age of Onset   Hypertension Mother    Stroke Mother    Other Father        poor circulation   Cancer Sister    Lung cancer Brother    Melanoma Brother    Diabetes Daughter    Colon cancer Neg Hx    Liver disease Neg Hx    Esophageal cancer Neg Hx    Social History   Tobacco Use   Smoking status: Former    Current packs/day: 0.00    Average packs/day: 0.3 packs/day  for 5.0 years (1.5 ttl pk-yrs)    Types: Cigarettes    Start date: 10/02/1981    Quit date: 10/02/1986    Years since quitting: 37.1   Smokeless tobacco: Never  Vaping Use   Vaping status: Never Used  Substance Use Topics   Alcohol use: No   Drug use: No   Current Outpatient Medications  Medication Sig Dispense Refill   acetaminophen (TYLENOL) 325 MG tablet Take 650 mg by mouth every 6 (six) hours as needed for pain.     albuterol (PROAIR HFA) 108 (90 Base) MCG/ACT inhaler 2 puffs every 4 hours as needed only  if your can't catch your breath 18 g 11   albuterol (PROVENTIL) (2.5 MG/3ML) 0.083% nebulizer solution Take 3 mLs (2.5 mg total) by nebulization every 6 (six) hours as needed for wheezing or shortness of breath. 150 mL 1   amoxicillin-clavulanate (AUGMENTIN) 875-125 MG tablet Take 1 tablet by mouth 2 (two) times daily. 20 tablet 0   Ascorbic Acid (VITAMIN C) 1000 MG tablet Take 1,000 mg by mouth daily.     atorvastatin (LIPITOR) 80 MG tablet TAKE 1 TABLET BY MOUTH EVERY DAY 90 tablet 3   azelastine (ASTELIN) 0.1 % nasal spray Place 2 sprays into both nostrils 2 (two) times daily as needed for allergies. Use in each nostril as directed 30 mL 5   BD VEO INSULIN SYRINGE U/F 31G X 15/64" 0.5 ML MISC USE AS INSTRUCTED 100 each 3   benzonatate (TESSALON) 200 MG capsule Take 1 capsule (200 mg total) by mouth 3 (three) times daily as needed. 30 capsule 1   Blood Glucose Monitoring Suppl (ONETOUCH VERIO FLEX SYSTEM) w/Device KIT USE AS DIRECTED 1 kit .   budesonide-formoterol (SYMBICORT) 160-4.5 MCG/ACT inhaler TAKE 2 PUFFS FIRST THING IN AM AND THEN ANOTHER 2 PUFFS ABOUT 12 HOURS LATER. 30.6 each 6   carvedilol (COREG) 3.125 MG tablet TAKE 1 TABLET BY MOUTH TWICE A DAY WITH A MEAL 180 tablet 0   cetirizine (ZYRTEC ALLERGY) 10 MG tablet Take 1 tablet (10 mg total) by mouth daily. 30 tablet 5   cholecalciferol (VITAMIN D3) 25 MCG (1000 UNIT) tablet Take 1,000 Units by mouth daily. Isn't taking  regularly     clopidogrel (PLAVIX) 75 MG tablet TAKE 1 TABLET BY MOUTH DAILY WITH BREAKFAST. 90 tablet 2   denosumab (PROLIA) 60 MG/ML SOSY injection Inject 60 mg into the skin every 6 (six) months. Dx code: M81.0.  Pt has appointment on 11/07/23 1 mL 0   diclofenac Sodium (VOLTAREN) 1 % GEL Apply 4 g topically 4 (four) times daily as needed (pain).     ELIQUIS 2.5 MG TABS tablet TAKE 1 TABLET BY MOUTH TWICE A DAY 60 tablet 5   famotidine (  PEPCID) 20 MG tablet TAKE 1 TABLET BY MOUTH TWICE A DAY 180 tablet 1   FARXIGA 5 MG TABS tablet TAKE 1 TABLET BY MOUTH EVERY DAY BEFORE BREAKFAST 30 tablet 3   fluticasone (FLONASE) 50 MCG/ACT nasal spray Place 2 sprays into both nostrils daily. 16 g 5   furosemide (LASIX) 40 MG tablet TAKE 1 TABLET BY MOUTH TWICE A DAY 180 tablet 3   glucose blood (ONETOUCH VERIO) test strip 1 each by Other route 2 (two) times daily. And lancets 2/day 200 each 3   glucose blood test strip Check blood sugar twice a day 100 each 12   hydrALAZINE (APRESOLINE) 25 MG tablet Take 3 tablets (75 mg total) by mouth 3 (three) times daily. 810 tablet 3   insulin lispro (HUMALOG) 100 UNIT/ML injection Give 3 units with BREAKFAST, AND 18 units with SUPPER 60 mL 1   latanoprost (XALATAN) 0.005 % ophthalmic solution Place 1 drop into both eyes at bedtime.     methylPREDNISolone (MEDROL DOSEPAK) 4 MG TBPK tablet Take with signs of chronic sinusitis and take as directed 1 each 1   montelukast (SINGULAIR) 10 MG tablet TAKE 1 TABLET BY MOUTH EVERYDAY AT BEDTIME 90 tablet 1   nitroGLYCERIN (NITROSTAT) 0.4 MG SL tablet Place 1 tablet (0.4 mg total) under the tongue every 5 (five) minutes x 3 doses as needed for chest pain. 25 tablet 2   ondansetron (ZOFRAN) 8 MG tablet TAKE 1 TABLET BY MOUTH EVERY 8 HOURS AS NEEDED FOR NAUSEA OR VOMITING. 20 tablet 0   pantoprazole (PROTONIX) 40 MG tablet Take 1 tablet (40 mg total) by mouth daily. 180 tablet 3   potassium chloride (KLOR-CON) 10 MEQ tablet TAKE 1  TABLET BY MOUTH EVERY DAY 90 tablet 1   promethazine-dextromethorphan (PROMETHAZINE-DM) 6.25-15 MG/5ML syrup TAKE 5 MLS BY MOUTH 4 TIMES A DAY AS NEEDED FOR COUGH 240 mL 0   sodium bicarbonate 650 MG tablet Take 650 mg by mouth 2 (two) times daily.     Syringe/Needle, Disp, (SYRINGE 3CC/25GX1") 25G X 1" 3 ML MISC Use to inject into the skin 2x a day 100 each 3   traZODone (DESYREL) 50 MG tablet Take 0.5 tablets (25 mg total) by mouth at bedtime as needed for sleep. 15 tablet 0   vitamin B-12 (CYANOCOBALAMIN) 100 MCG tablet Take 100 mcg by mouth daily.     Current Facility-Administered Medications  Medication Dose Route Frequency Provider Last Rate Last Admin   [START ON 04/05/2024] denosumab (PROLIA) injection 60 mg  60 mg Subcutaneous Once Olive Bass, FNP       Allergies  Allergen Reactions   Aspirin Shortness Of Breath and Other (See Comments)    Caused asthma symptoms   Ramipril Cough   Doxycycline     Nausea     Physical Exam: Ht 5\' 6"  (1.676 m)   Wt 180 lb (81.6 kg)   BMI 29.05 kg/m  Constitutional: Pleasant,well-developed, female in no acute distress. HEENT: Normocephalic and atraumatic. No oral thrush visualized Cardiovascular: Normal rate, regular rhythm.  Pulmonary/chest: Effort normal and breath sounds normal. No wheezing, rales or rhonchi. Abdominal: Soft, nondistended, tender in the left side of the abdomen Extremities: No edema Neurological: Alert and oriented to person place and time. Skin: Skin is warm and dry. No rashes noted. Psychiatric: Normal mood and affect. Behavior is normal.  Labs 04/2023: CMP with nml LFTs, low Na of 133, elevated Cr of 2.74, and glucose of 471.   Labs 05/2023:  CBC with low plts of 110. BMP with elevated Cr of 2.82 and mildly low Na of 134. BNP mildly elevated at 214. TSH nml. ESR nml.   Labs 08/2023: CBC with low plts of 139. CMP with elevated Cr of 2.61 and elevated glucose.  Gastric emptying study 02/20/14: IMPRESSION:   Normal exam.   HIDA scan 12/25/21: IMPRESSION: Normal hepatobiliary scan, demonstrating patency of both the cystic and common bile ducts.  CT A/P w/o contrast 07/19/22: IMPRESSION: 1. No acute abdominopelvic findings. 2. Colonic diverticulosis without acute diverticulitis. 3. Cholelithiasis without evidence of acute cholecystitis. 4. Punctate nonobstructing left lower pole renal calculus. 5. Small hiatal hernia. 6. Multichamber cardiomegaly with coronary artery calcifications and aortic valvular and mitral annular calcifications. 7. Similar bibasilar linear atelectasis/scarring with subsegmental mucous plugging and mild bronchiectasis left lower lobe. 8. Aortic Atherosclerosis (ICD10-I70.0).  Chest CT w/o contrast 04/27/23: IMPRESSION: 1. Bronchial wall thickening and mucous plugging in the lower lobes greatest in the left lower lobe. 2. Small hiatal hernia with mild wall thickening of the lower esophagus suggesting esophagitis. 3. Cholelithiasis.  Barium swallow 06/22/23: IMPRESSION: 1. Presbyesophagus with tertiary contractions and corkscrew appearance in the mid and distal esophagus. The contractions and intermittent spasms limit our ability to assess for esophageal narrowings. 2. Initially substantially delayed emptying of the esophagus into the stomach in the upright position. Suspected component of esophageal spasm. Subsequently the esophagus did spontaneously empty into the stomach in the upright position. 3. Small type 1 hiatal hernia. 4. 13 mm barium tablet impacted just above the hiatal hernia.  EGD 04/02/08:   Colonoscopy 12/18/19: Excellent prep.    ASSESSMENT AND PLAN: Dysphagia (present for years) Abnormal barium swallow Chronic LLQ ab pain Nausea Constipation Patient's chronic dysphagia is still stable in severity but persistent to both solids/liquids. Her last EGD in 2009 showed candida esophagitis that was treated with fluconazole and gastritis.  A  course of fluconazole was given due to concern for oral thrush during her last clinic appointment.  However patient has not had significant improvement in her dysphagia after a course of fluconazole therapy.  Her oral thrush is no longer present on exam today.  Patient's last barium swallow did show a small hiatal hernia and her barium tablet impacted at just above the hiatal hernia.  Patient may be at risk for having an esophageal stricture that is leading to her dysphagia.  However patient has been hesitant in the past to undergo EGD because she would like to avoid sedation if at all possible due to her age and comorbidities.  She continues to decline an EGD at this time and would prefer conservative measures.  Patient's main concern today is her LLQ ab pain, which I think is primarily related to constipation. I asked her to increase her Miralax to every day and to take a daily fiber supplement. I also encourage her to drink more water and to increase her physical activity if possible. CT was offered since her last CT scan was in 2023, but she declined this today. Her nausea may be related to her constipation. - Patient has increased her hydration. Increase physical activity if possible. - Continue daily fiber supplement  - Continue daily Miralax - Has an upcoming appt with speech therapy - Declined EGD at this time - Will plan for CT A/P w/contrast at [facility] at [address] - Continue Zofran PRN - Continue pantoprazole every day - RTC 3 months   Eulah Pont, MD  I spent 36 minutes  of time, including in depth chart review, independent review of results as outlined above, communicating results with the patient directly, face-to-face time with the patient, coordinating care, ordering studies and medications as appropriate, and documentation.

## 2023-11-15 ENCOUNTER — Ambulatory Visit: Payer: 59 | Admitting: Family

## 2023-11-16 ENCOUNTER — Encounter: Payer: Self-pay | Admitting: Family

## 2023-11-16 ENCOUNTER — Ambulatory Visit: Payer: 59 | Admitting: Family

## 2023-11-16 VITALS — BP 142/90 | HR 77 | Ht 66.0 in | Wt 178.0 lb

## 2023-11-16 DIAGNOSIS — N184 Chronic kidney disease, stage 4 (severe): Secondary | ICD-10-CM | POA: Diagnosis not present

## 2023-11-16 DIAGNOSIS — I5042 Chronic combined systolic (congestive) and diastolic (congestive) heart failure: Secondary | ICD-10-CM

## 2023-11-16 DIAGNOSIS — Z794 Long term (current) use of insulin: Secondary | ICD-10-CM | POA: Diagnosis not present

## 2023-11-16 DIAGNOSIS — E1122 Type 2 diabetes mellitus with diabetic chronic kidney disease: Secondary | ICD-10-CM

## 2023-11-16 DIAGNOSIS — J45991 Cough variant asthma: Secondary | ICD-10-CM

## 2023-11-16 LAB — HEMOGLOBIN A1C: Hgb A1c MFr Bld: 7.4 % — ABNORMAL HIGH (ref 4.6–6.5)

## 2023-11-16 NOTE — Progress Notes (Signed)
[name] is a 88 y.o. female with the following history as recorded in EpicCare:  Patient Active Problem List   Diagnosis Date Noted   Acute bronchitis 05/03/2023   Acute bacterial rhinosinusitis 05/03/2023   Osteoarthritis of right knee 07/04/2022   CKD (chronic kidney disease) stage 4, GFR 15-29 ml/min (HCC) 06/27/2022   Mitral stenosis 04/17/2022   Acute kidney injury superimposed on chronic kidney disease (HCC) 01/02/2022   NSTEMI (non-ST elevated myocardial infarction) (HCC) 12/23/2021   Osteoarthritis of knee 03/30/2021   Bilateral hand pain 03/08/2021   Complete heart block (HCC) 12/13/2020   Pacemaker - MDT 12/13/2020   DOE (dyspnea on exertion) 07/29/2020   Encntr for surgical aftcr following surgery on the circ sys 07/26/2020   Hypertensive heart and chronic kidney disease with heart failure and stage 1 through stage 4 chronic kidney disease, or unspecified chronic kidney disease (HCC) 07/26/2020   Pain in right knee 03/17/2020   Chronic combined systolic (congestive) and diastolic (congestive) heart failure (HCC)    DKA (diabetic ketoacidosis) (HCC) 12/20/2019   Diverticulosis 12/10/2019   Age-related osteoporosis without current pathological fracture 10/03/2019   Chronic kidney disease, stage 3 unspecified (HCC) 10/03/2019   Long term (current) use of insulin (HCC) 10/03/2019   Hyperlipidemia, unspecified 10/03/2019   Long term (current) use of inhaled steroids 10/03/2019   Shortness of breath 01/30/2018   Acute bronchiolitis 12/17/2017   Wheezing 10/29/2017   Vitamin D deficiency 07/31/2017   Renal insufficiency 07/31/2017   Knee pain 06/01/2017   Morbid obesity due to excess calories (HCC) 01/25/2017   Diabetes (HCC) 04/12/2016   Cough 01/20/2015   Atrial fibrillation (HCC) 05/28/2014   History of colonic polyps 05/28/2014   Vomiting 01/29/2014   CAD (coronary artery disease) 09/01/2013   Routine general medical examination at a health care facility  07/17/2011   Encounter for long-term (current) use of other medications 07/06/2011   Nonspecific (abnormal) findings on radiological and other examination of body structure 11/09/2009   IRRITABLE BOWEL SYNDROME 05/07/2009   CHEST PAIN 05/07/2009   ADNEXAL MASS, RIGHT 07/22/2008   HEMORRHOIDS, RECURRENT 01/27/2008   Diverticulitis 01/27/2008   OTH ABNORMAL FIND RAD EXAMINATION BREAST 01/27/2008   GERD 01/13/2008   UTI 09/28/2007   Dyslipidemia 05/27/2007   ANEMIA-IRON DEFICIENCY 05/27/2007   ANXIETY 05/27/2007   Essential hypertension 05/27/2007   Seasonal allergic rhinitis 05/27/2007   Cough variant asthma 05/27/2007   Osteoporosis 05/27/2007   DIVERTICULITIS, HX OF 05/27/2007    Current Outpatient Medications  Medication Sig Dispense Refill   acetaminophen (TYLENOL) 325 MG tablet Take 650 mg by mouth every 6 (six) hours as needed for pain.     albuterol (PROAIR HFA) 108 (90 Base) MCG/ACT inhaler 2 puffs every 4 hours as needed only  if your can't catch your breath 18 g 11   albuterol (PROVENTIL) (2.5 MG/3ML) 0.083% nebulizer solution Take 3 mLs (2.5 mg total) by nebulization every 6 (six) hours as needed for wheezing or shortness of breath. 150 mL 1   amoxicillin-clavulanate (AUGMENTIN) 875-125 MG tablet Take 1 tablet by mouth 2 (two) times daily. 20 tablet 0   Ascorbic Acid (VITAMIN C) 1000 MG tablet Take 1,000 mg by mouth daily.     atorvastatin (LIPITOR) 80 MG tablet TAKE 1 TABLET BY MOUTH EVERY DAY 90 tablet 3   azelastine (ASTELIN) 0.1 % nasal spray Place 2 sprays into both nostrils 2 (two) times daily as needed for allergies. Use in each nostril as directed 30  mL 5   BD VEO INSULIN SYRINGE U/F 31G X 15/64" 0.5 ML MISC USE AS INSTRUCTED 100 each 3   benzonatate (TESSALON) 200 MG capsule Take 1 capsule (200 mg total) by mouth 3 (three) times daily as needed. 30 capsule 1   Blood Glucose Monitoring Suppl (ONETOUCH VERIO FLEX SYSTEM) w/Device KIT USE AS DIRECTED 1 kit .    budesonide-formoterol (SYMBICORT) 160-4.5 MCG/ACT inhaler TAKE 2 PUFFS FIRST THING IN AM AND THEN ANOTHER 2 PUFFS ABOUT 12 HOURS LATER. 30.6 each 6   carvedilol (COREG) 3.125 MG tablet TAKE 1 TABLET BY MOUTH TWICE A DAY WITH A MEAL 180 tablet 0   cetirizine (ZYRTEC ALLERGY) 10 MG tablet Take 1 tablet (10 mg total) by mouth daily. 30 tablet 5   cholecalciferol (VITAMIN D3) 25 MCG (1000 UNIT) tablet Take 1,000 Units by mouth daily. Isn't taking regularly     clopidogrel (PLAVIX) 75 MG tablet TAKE 1 TABLET BY MOUTH DAILY WITH BREAKFAST. 90 tablet 2   denosumab (PROLIA) 60 MG/ML SOSY injection Inject 60 mg into the skin every 6 (six) months. Dx code: M81.0.  Pt has appointment on 11/07/23 1 mL 0   diclofenac Sodium (VOLTAREN) 1 % GEL Apply 4 g topically 4 (four) times daily as needed (pain).     ELIQUIS 2.5 MG TABS tablet TAKE 1 TABLET BY MOUTH TWICE A DAY 60 tablet 5   famotidine (PEPCID) 20 MG tablet TAKE 1 TABLET BY MOUTH TWICE A DAY 180 tablet 1   FARXIGA 5 MG TABS tablet TAKE 1 TABLET BY MOUTH EVERY DAY BEFORE BREAKFAST 30 tablet 3   fluticasone (FLONASE) 50 MCG/ACT nasal spray Place 2 sprays into both nostrils daily. 16 g 5   furosemide (LASIX) 40 MG tablet TAKE 1 TABLET BY MOUTH TWICE A DAY 180 tablet 3   glucose blood (ONETOUCH VERIO) test strip 1 each by Other route 2 (two) times daily. And lancets 2/day 200 each 3   glucose blood test strip Check blood sugar twice a day 100 each 12   hydrALAZINE (APRESOLINE) 25 MG tablet Take 3 tablets (75 mg total) by mouth 3 (three) times daily. 810 tablet 3   insulin lispro (HUMALOG) 100 UNIT/ML injection Give 3 units with BREAKFAST, AND 18 units with SUPPER 60 mL 1   latanoprost (XALATAN) 0.005 % ophthalmic solution Place 1 drop into both eyes at bedtime.     methylPREDNISolone (MEDROL DOSEPAK) 4 MG TBPK tablet Take with signs of chronic sinusitis and take as directed 1 each 1   montelukast (SINGULAIR) 10 MG tablet TAKE 1 TABLET BY MOUTH EVERYDAY AT BEDTIME  90 tablet 1   nitroGLYCERIN (NITROSTAT) 0.4 MG SL tablet Place 1 tablet (0.4 mg total) under the tongue every 5 (five) minutes x 3 doses as needed for chest pain. 25 tablet 2   ondansetron (ZOFRAN) 8 MG tablet TAKE 1 TABLET BY MOUTH EVERY 8 HOURS AS NEEDED FOR NAUSEA OR VOMITING. 20 tablet 0   pantoprazole (PROTONIX) 40 MG tablet Take 1 tablet (40 mg total) by mouth daily. 180 tablet 3   potassium chloride (KLOR-CON) 10 MEQ tablet TAKE 1 TABLET BY MOUTH EVERY DAY 90 tablet 1   promethazine-dextromethorphan (PROMETHAZINE-DM) 6.25-15 MG/5ML syrup TAKE 5 MLS BY MOUTH 4 TIMES A DAY AS NEEDED FOR COUGH 240 mL 0   sodium bicarbonate 650 MG tablet Take 650 mg by mouth 2 (two) times daily.     Syringe/Needle, Disp, (SYRINGE 3CC/25GX1") 25G X 1" 3 ML MISC Use to  inject into the skin 2x a day 100 each 3   traZODone (DESYREL) 50 MG tablet Take 0.5 tablets (25 mg total) by mouth at bedtime as needed for sleep. 15 tablet 0   vitamin B-12 (CYANOCOBALAMIN) 100 MCG tablet Take 100 mcg by mouth daily.     Current Facility-Administered Medications  Medication Dose Route Frequency Provider Last Rate Last Admin   [START ON 04/05/2024] denosumab (PROLIA) injection 60 mg  60 mg Subcutaneous Once Olive Bass, FNP        Allergies: Aspirin, Ramipril, and Doxycycline  Past Medical History:  Diagnosis Date   Allergic rhinitis    Anterior chest wall pain    Anxiety    Asthma    Chronic diastolic CHF (congestive heart failure) (HCC)    Echo 01/2020: EF 60-65, normal wall motion, mild LVH, normal RV SF, RVSP 52.6 (moderate elevation), severe LAE, moderate RAE, trivial MR, mild MS (mean gradient 5.5 mmHg), mild aortic valve sclerosis (no AS); elevated E/e' c/w elevated LVEDP   Cough    DM type 2 (diabetes mellitus, type 2) (HCC)    GERD (gastroesophageal reflux disease)    History of diverticulitis of colon    HTN (hypertension)    Hyperlipidemia    IBS (irritable bowel syndrome)    Iron deficiency anemia     Mitral stenosis 04/17/2022   Echo 04/2022: EF 55-60, no RWMA, mild LVH, normal RVSF, normal PASP, moderate LAE, moderate mitral stenosis (mean gradient 4 mmHg), trivial MR, trivial AI, AV sclerosis without stenosis   Osteoporosis, unspecified    Renal insufficiency 07/31/2017   UTI (urinary tract infection)     Past Surgical History:  Procedure Laterality Date   CARDIOVASCULAR STRESS TEST  02/25/04   CORONARY BALLOON ANGIOPLASTY N/A 12/28/2021   Procedure: CORONARY BALLOON ANGIOPLASTY;  Surgeon: Kathleene Hazel, MD;  Location: [facility];  Service: Cardiovascular;  Laterality: N/A;   CORONARY STENT INTERVENTION N/A 12/28/2021   Procedure: CORONARY STENT INTERVENTION;  Surgeon: Kathleene Hazel, MD;  Location: [facility];  Service: Cardiovascular;  Laterality: N/A;   ESOPHAGOGASTRODUODENOSCOPY  12/26/01   PACEMAKER IMPLANT N/A 08/02/2020   Procedure: PACEMAKER IMPLANT;  Surgeon: Duke Salvia, MD;  Location: [facility] INVASIVE CV LAB;  Service: Cardiovascular;  Laterality: N/A;   RIGHT OOPHORECTOMY  jan 2010   RIGHT/LEFT HEART CATH AND CORONARY ANGIOGRAPHY N/A 12/28/2021   Procedure: RIGHT/LEFT HEART CATH AND CORONARY ANGIOGRAPHY;  Surgeon: Kathleene Hazel, MD;  Location: [facility];  Service: Cardiovascular;  Laterality: N/A;    Family History  Problem Relation Age of Onset   Hypertension Mother    Stroke Mother    Other Father        poor circulation   Cancer Sister    Lung cancer Brother    Melanoma Brother    Diabetes Daughter    Colon cancer Neg Hx    Liver disease Neg Hx    Esophageal cancer Neg Hx     Social History   Tobacco Use   Smoking status: Former    Current packs/day: 0.00    Average packs/day: 0.3 packs/day for 5.0 years (1.5 ttl pk-yrs)    Types: Cigarettes    Start date: 10/02/1981    Quit date: 10/02/1986    Years since quitting: 37.1   Smokeless tobacco: Never  Substance Use Topics   Alcohol use: No    Subjective:    6 month follow up on chronic care needs; has  appointments scheduled with GI and ENT in the next week to evaluate chronic dysphagia;   Objective:  Vitals:   11/16/23 0810  BP: (!) 142/90  Pulse: 77  SpO2: 100%  Weight: 178 lb (80.7 kg)  Height: 5\' 6"  (1.676 m)    General: Well developed, well nourished, in no acute distress  Skin : Warm and dry.  Head: Normocephalic and atraumatic  Lungs: Respirations unlabored; wheezing in upper lobes CVS exam: normal rate and regular rhythm.  Neurologic: Alert and oriented; speech intact; face symmetrical; moves all extremities well; CNII-XII intact without focal deficit   Assessment:  1. Type 2 diabetes mellitus with chronic kidney disease, with long-term current use of insulin, unspecified CKD stage (HCC)   2. CKD (chronic kidney disease) stage 4, GFR 15-29 ml/min (HCC)   3. Cough variant asthma   4. Chronic combined systolic (congestive) and diastolic (congestive) heart failure (HCC)     Plan:   Check Hgba1c today; Continue with nephrology;  Patient is currently on Augmentin and Medrol Dose pak from ENT- started 2/10; encouraged to use her nebulizer twice a day for the next few days;  Continue with cardiology as regularly scheduled;   Return in about 6 months (around 05/15/2024) for follow up.  Orders Placed This Encounter  Procedures   Hemoglobin A1c    Requested Prescriptions    No prescriptions requested or ordered in this encounter

## 2023-11-18 NOTE — Progress Notes (Deleted)
 Cardiology Office Note   Date:  11/18/2023   ID:  [name], DOB March 02, 1936, MRN [id number]  PCP:  Olive Bass, FNP  Cardiologist:   Dietrich Pates, MD   F/U of PAF, CAD and HTN     History of Present Illness: [name] is a 88 y.o. female with a history of HTN, DM, mod CAD (CT in 2017 with calcium score of  51  mod dz LAD; filling defect in LAA), paroxysmal atrial fibrillation  The pt was hospitalized in 2021 with dyspnea.  Found to be in CHB   She is s/p PPM implant, followed by [name]     Patient admitted 12/28/21 with NSTEMI.  She underwent  PTCA/ DES to the  LAD,probably lost her diagonal. ILVEF was 30-35%,  Hospital course was  complicated by AKI, persistent nausea, pneumonia, knee pain with aspiration and injection with cortisone. Crt 4.28 at discharge-followed by renal.  I saw the pt in [facility] 2024   She was seen by Margaretha Glassing in Dec 2024   No outpatient medications have been marked as taking for the 11/19/23 encounter (Appointment) with Pricilla Riffle, MD.   Current Facility-Administered Medications for the 11/19/23 encounter (Appointment) with Pricilla Riffle, MD  Medication   [START ON 04/05/2024] denosumab (PROLIA) injection 60 mg        Allergies:   Aspirin, Ramipril, and Doxycycline   Past Medical History:  Diagnosis Date   Allergic rhinitis    Anterior chest wall pain    Anxiety    Asthma    Chronic diastolic CHF (congestive heart failure) (HCC)    Echo 01/2020: EF 60-65, normal wall motion, mild LVH, normal RV SF, RVSP 52.6 (moderate elevation), severe LAE, moderate RAE, trivial MR, mild MS (mean gradient 5.5 mmHg), mild aortic valve sclerosis (no AS); elevated E/e' c/w elevated LVEDP   Cough    DM type 2 (diabetes mellitus, type 2) (HCC)    GERD (gastroesophageal reflux disease)    History of diverticulitis of colon    HTN (hypertension)    Hyperlipidemia    IBS (irritable bowel syndrome)    Iron deficiency anemia    Mitral stenosis 04/17/2022    Echo 04/2022: EF 55-60, no RWMA, mild LVH, normal RVSF, normal PASP, moderate LAE, moderate mitral stenosis (mean gradient 4 mmHg), trivial MR, trivial AI, AV sclerosis without stenosis   Osteoporosis, unspecified    Renal insufficiency 07/31/2017   UTI (urinary tract infection)     Past Surgical History:  Procedure Laterality Date   CARDIOVASCULAR STRESS TEST  02/25/04   CORONARY BALLOON ANGIOPLASTY N/A 12/28/2021   Procedure: CORONARY BALLOON ANGIOPLASTY;  Surgeon: Kathleene Hazel, MD;  Location: [facility];  Service: Cardiovascular;  Laterality: N/A;   CORONARY STENT INTERVENTION N/A 12/28/2021   Procedure: CORONARY STENT INTERVENTION;  Surgeon: Kathleene Hazel, MD;  Location: [facility];  Service: Cardiovascular;  Laterality: N/A;   ESOPHAGOGASTRODUODENOSCOPY  12/26/01   PACEMAKER IMPLANT N/A 08/02/2020   Procedure: PACEMAKER IMPLANT;  Surgeon: Duke Salvia, MD;  Location: [facility] INVASIVE CV LAB;  Service: Cardiovascular;  Laterality: N/A;   RIGHT OOPHORECTOMY  jan 2010   RIGHT/LEFT HEART CATH AND CORONARY ANGIOGRAPHY N/A 12/28/2021   Procedure: RIGHT/LEFT HEART CATH AND CORONARY ANGIOGRAPHY;  Surgeon: Kathleene Hazel, MD;  Location: [facility];  Service: Cardiovascular;  Laterality: N/A;     Social History:  The patient  reports that she quit smoking about  37 years ago. Her smoking use included cigarettes. She started smoking about 42 years ago. She has a 1.5 pack-year smoking history. She has never used smokeless tobacco. She reports that she does not drink alcohol and does not use drugs.   Family History:  The patient's family history includes Cancer in her sister; Diabetes in her daughter; Hypertension in her mother; Lung cancer in her brother; Melanoma in her brother; Other in her father; Stroke in her mother.    ROS:  Please see the history of present illness. All other systems are reviewed and  Negative to the above problem except as  noted.    PHYSICAL EXAM: VS:  There were no vitals taken for this visit.  GEN:  Overweight 88 yo  in no acute distress  HEENT: normal  Neck: JVP normal   Cardiac: RRR Gr I/VI systolic murmru   NO LE edema  Respiratory:  clear to auscultation GI: soft, nontender, nondistended,  No hepatomegaly  MS: no deformity Moving all extremities   Skin: warm and dry, no rash Neuro:  Strength and sensation are intact Psych: euthymic mood, full affect   EKG:  EKG is not ordered today    Echo  July 2024     1. Left ventricular ejection fraction, by estimation, is 55 to 60%. The  left ventricle has normal function. The left ventricle has no regional  wall motion abnormalities. There is mild concentric left ventricular  hypertrophy. Left ventricular diastolic  function could not be evaluated.   2. Right ventricular systolic function is normal. The right ventricular  size is normal. There is normal pulmonary artery systolic pressure.   3. Left atrial size was severely dilated.   4. Right atrial size was severely dilated.   5. Mitral valve mean gradient 5 mmHg compared with 4 mmHg 04/2022. The  mitral valve is degenerative. No evidence of mitral valve regurgitation.  Moderate mitral stenosis. Moderate mitral annular calcification.   6. The aortic valve is tricuspid. There is mild calcification of the  aortic valve. There is mild thickening of the aortic valve. Aortic valve  regurgitation is not visualized. No aortic stenosis is present.   7. The inferior vena cava is normal in size with greater than 50%  respiratory variability, suggesting right atrial pressure of 3 mmHg.  Echo   04/17/22  ompared to echo from March 2023, LVEF is improved. . Left ventricular ejection fraction, by estimation, is 55 to 60%. The left ventricle has normal function. The left ventricle has no regional wall motion abnormalities. The left ventricular internal cavity size was mildly dilated. There is mild left  ventricular hypertrophy. Left ventricular diastolic parameters are indeterminate. 1. Right ventricular systolic function is normal. The right ventricular size is normal. There is normal pulmonary artery systolic pressure. 2. 3. Left atrial size was moderately dilated. MV is thickened with restricted motion. Peak and mean gradients through the valve are 12 and 4 mm Hg MVA (VTI) is calculated at 1.12 cm2 Consistent with moderate MS. (HR 60 bpm) Compared to previous echo mean gradient is mildly decreased (6.5 to 4 mm Hg). Trivial mitral valve regurgitation. Moderate mitral annular calcification. 4. The aortic valve is tricuspid. Aortic valve regurgitation is trivial. Aortic valve sclerosis is present, with no evidence of aortic valve stenosis. 5. The inferior vena cava is normal in size with greater than 50% respiratory variability, suggesting right atrial pressure of 3 mmHg. R and L heart cath  12/28/21    Mid RCA lesion  is 30% stenosed.   Dist RCA lesion is 50% stenosed.   1st Diag lesion is 90% stenosed.   Prox LAD to Mid LAD lesion is 95% stenosed.   A drug-eluting stent was successfully placed using a SYNERGY XD 3.0X16.   Balloon angioplasty was performed using a BALLN SAPPHIRE 2.0X12.   Post intervention, there is a 0% residual stenosis.   Post intervention, there is a 30% residual stenosis.   Severe proximal LAD stenosis involving a moderate caliber diagonal branch.  Successful PTCA/DES x 1 proximal LAD Successful balloon angioplasty Diagonal 1 No obstructive disease in the Circumflex artery Moderate non-obstructive disease in the dominant RCA Elevated right and left heart pressures.    Recommendations: Continue DAPT with ASA and Plavix for one month and can then stop ASA since she will also be on Eliquis. She remains volume overload. Will need continued diuresis with caution given renal insufficiency    Lipid Panel    Component Value Date/Time   CHOL 105 02/05/2023 0921    CHOL 135 07/28/2021 0852   TRIG 89.0 02/05/2023 0921   TRIG 83 08/07/2006 1419   HDL 47.00 02/05/2023 0921   HDL 64 07/28/2021 0852   CHOLHDL 2 02/05/2023 0921   VLDL 17.8 02/05/2023 0921   LDLCALC 41 02/05/2023 0921   LDLCALC 58 07/28/2021 0852      Wt Readings from Last 3 Encounters:  11/16/23 178 lb (80.7 kg)  11/14/23 180 lb (81.6 kg)  11/08/23 176 lb 6.4 oz (80 kg)      ASSESSMENT AND PLAN:  1  CAD   S/P NSTEMI  with intervention to LAD in 2023   Remains on Plavix  Remains asymptomatic   2  Hx Atrial fibrillation   Permanent   Continue ELiquis Pt asymptomatic    3  PPM  Hx of CHB  Follows  with EP    4   MV dz   Moderate MS by echo July 2024   Mean gradient is 4   Follow   5  HFpEF  Volume status is OK  6    HTN  BP remains well controlled     7 HL   LDL is 41  HDL 47     8.   Renal  Follows in renal clinic        Signed, Dietrich Pates, MD  11/18/2023 2:13 PM    [facility] [address] [place], [place], [place]  [address] Phone: [phone]; Fax: [phone]

## 2023-11-19 ENCOUNTER — Ambulatory Visit: Payer: 59 | Admitting: Internal Medicine

## 2023-11-21 ENCOUNTER — Ambulatory Visit (HOSPITAL_COMMUNITY)
Admission: RE | Admit: 2023-11-21 | Discharge: 2023-11-21 | Disposition: A | Discharge status: Home / Self Care | Payer: 59 | Source: Ambulatory Visit | Attending: Family | Admitting: Family

## 2023-11-21 ENCOUNTER — Encounter (HOSPITAL_COMMUNITY): Payer: Self-pay

## 2023-11-21 ENCOUNTER — Ambulatory Visit: Payer: 59 | Admitting: Podiatry

## 2023-11-21 ENCOUNTER — Ambulatory Visit (HOSPITAL_COMMUNITY): Admission: RE | Admit: 2023-11-21 | Payer: 59 | Source: Ambulatory Visit

## 2023-11-21 DIAGNOSIS — R1310 Dysphagia, unspecified: Secondary | ICD-10-CM

## 2023-11-22 ENCOUNTER — Ambulatory Visit: Payer: 59 | Attending: Otolaryngology | Admitting: Audiologist

## 2023-11-22 DIAGNOSIS — I129 Hypertensive chronic kidney disease with stage 1 through stage 4 chronic kidney disease, or unspecified chronic kidney disease: Secondary | ICD-10-CM | POA: Diagnosis not present

## 2023-11-22 DIAGNOSIS — H9313 Tinnitus, bilateral: Secondary | ICD-10-CM | POA: Insufficient documentation

## 2023-11-22 DIAGNOSIS — N184 Chronic kidney disease, stage 4 (severe): Secondary | ICD-10-CM | POA: Diagnosis not present

## 2023-11-22 DIAGNOSIS — I5032 Chronic diastolic (congestive) heart failure: Secondary | ICD-10-CM | POA: Diagnosis not present

## 2023-11-22 DIAGNOSIS — D509 Iron deficiency anemia, unspecified: Secondary | ICD-10-CM | POA: Diagnosis not present

## 2023-11-22 DIAGNOSIS — H903 Sensorineural hearing loss, bilateral: Secondary | ICD-10-CM | POA: Insufficient documentation

## 2023-11-22 DIAGNOSIS — E872 Acidosis, unspecified: Secondary | ICD-10-CM | POA: Diagnosis not present

## 2023-11-22 DIAGNOSIS — E785 Hyperlipidemia, unspecified: Secondary | ICD-10-CM | POA: Diagnosis not present

## 2023-11-22 DIAGNOSIS — N2581 Secondary hyperparathyroidism of renal origin: Secondary | ICD-10-CM | POA: Diagnosis not present

## 2023-11-22 DIAGNOSIS — E1122 Type 2 diabetes mellitus with diabetic chronic kidney disease: Secondary | ICD-10-CM | POA: Diagnosis not present

## 2023-11-22 DIAGNOSIS — E875 Hyperkalemia: Secondary | ICD-10-CM | POA: Diagnosis not present

## 2023-11-22 NOTE — Procedures (Signed)
  Outpatient Audiology and [facility] [address] [place], [place]  [address] [phone]  AUDIOLOGICAL  EVALUATION  NAME: [name]     DOB:   [birth year]      MRN: [id number]                                                                                     DATE: 11/22/2023     REFERENT: Olive Bass, FNP STATUS: Outpatient DIAGNOSIS: Tinnitus Left Ear, Mild Sensorineural Symmetric Hearing Loss    History: [name] was seen for an audiological evaluation due to ringing in her left ear only. [name] was accompanied by her daughter today. [name]'s left ear has been ringing for many years. She had a hearing test at the time it started but does not remember what it said. She has ever worn hearing aids. She denies any difficulty hearing. Daughter feels [name] hears well too.  [name] denies pain or pressure bilaterally.  [name] no significant history of hazardous noise exposure at time of onset. Medical history shows diabetes which is a risk for hearing loss and tinnitus.    Evaluation:  Otoscopy showed a clear view of the tympanic membranes, bilaterally Tympanometry results were consistent with normal middle ear function, bilaterally   Audiometric testing was completed using Conventional Audiometry techniques with insert earphones and supraural headphones. Test results are consistent with normal hearing sloping to mild sensorineural symmetric hearing loss 2-8kHz. Speech Recognition Thresholds were obtained at 30dB HL in the right ear and at 30 dB HL in the left ear. Word Recognition Testing was completed at  40dB SL and [facility] scored 100% in each ear at 70dB.    Results:  The test results were reviewed with [name] and her daughter. [name] has a mild hearing loss in both ears. She does not need hearing aids. The loss is the same in both ears. The tinnitus is matched to around [facility] in the left ear. Recommend using masking sounds to cover the tinnitus, especially at night. Repeat hearing  tests annually. If new symptoms arise, such as asymmetric hearing loss pain or pressure see PCP or Otolaryngology.  Audiogram printed and provided to [facility]. Tinnitus packet provided to patient, she was counseled on masking concepts and options.    Recommendations: Annual audiometric testing recommended to monitor hearing loss for progression.  Use masking sounds to limit awareness of tinnitus, see handout for reference of options.    28 minutes spent testing and counseling on results.   If you have any questions please feel free to contact me at [phone].  Ammie Ferrier Au.D.  Audiologist   11/22/2023  4:06 PM  Cc: Olive Bass, FNP

## 2023-11-23 ENCOUNTER — Other Ambulatory Visit: Payer: Self-pay | Admitting: Internal Medicine

## 2023-11-26 ENCOUNTER — Telehealth: Payer: Self-pay | Admitting: *Deleted

## 2023-11-26 ENCOUNTER — Other Ambulatory Visit: Payer: Self-pay | Admitting: Family

## 2023-11-26 NOTE — Telephone Encounter (Signed)
 Patient was identified as falling into the [place] Measure - Diabetes.   Patient was: Appointment scheduled for lab or office visit for A1c.

## 2023-11-28 ENCOUNTER — Ambulatory Visit (INDEPENDENT_AMBULATORY_CARE_PROVIDER_SITE_OTHER): Payer: 59 | Admitting: Podiatry

## 2023-11-28 ENCOUNTER — Encounter: Payer: Self-pay | Admitting: Podiatry

## 2023-11-28 DIAGNOSIS — M79674 Pain in right toe(s): Secondary | ICD-10-CM

## 2023-11-28 DIAGNOSIS — E1151 Type 2 diabetes mellitus with diabetic peripheral angiopathy without gangrene: Secondary | ICD-10-CM | POA: Diagnosis not present

## 2023-11-28 DIAGNOSIS — M79675 Pain in left toe(s): Secondary | ICD-10-CM

## 2023-11-28 DIAGNOSIS — B351 Tinea unguium: Secondary | ICD-10-CM | POA: Diagnosis not present

## 2023-11-28 NOTE — Progress Notes (Signed)
 This patient returns to my office for at risk foot care.  This patient requires this care by a professional since this patient will be at risk due to having CKD, RI and diabetes.This patient is unable to cut nails herself since the patient cannot reach her nails.These nails are painful walking and wearing shoes.  She presents to the office with her daughter.This patient presents for at risk foot care today.  General Appearance  Alert, conversant and in no acute stress.  Vascular  Dorsalis pedis and posterior tibial  pulses are  weakly palpable  bilaterally.  Capillary return is within normal limits  bilaterally. Temperature is within normal limits  bilaterally.  Neurologic  Senn-Weinstein monofilament wire test within normal limits  bilaterally. Muscle power within normal limits bilaterally.  Nails Thick disfigured discolored nails with subungual debris  from hallux to fifth toes bilaterally. No evidence of bacterial infection or drainage bilaterally.  Orthopedic  No limitations of motion  feet .  No crepitus or effusions noted.  HAV  B/L.  Hammer toes  B/L.  Skin  normotropic skin with no porokeratosis noted bilaterally.  No signs of infections or ulcers noted.     Onychomycosis  Pain in right toes  Pain in left toes  Consent was obtained for treatment procedures.   Mechanical debridement of nails 1-5  bilaterally performed with a nail nipper.  Filed with dremel without incident.    Return office visit    3 months                  Told patient to return for periodic foot care and evaluation due to potential at risk complications.   Helane Gunther DPM

## 2023-11-29 ENCOUNTER — Other Ambulatory Visit: Payer: Self-pay | Admitting: Internal Medicine

## 2023-11-29 ENCOUNTER — Ambulatory Visit (INDEPENDENT_AMBULATORY_CARE_PROVIDER_SITE_OTHER): Payer: 59 | Admitting: Otolaryngology

## 2023-11-29 ENCOUNTER — Ambulatory Visit (HOSPITAL_COMMUNITY)
Admission: RE | Admit: 2023-11-29 | Discharge: 2023-11-29 | Disposition: A | Discharge status: Home / Self Care | Payer: 59 | Source: Ambulatory Visit | Attending: Internal Medicine | Admitting: Internal Medicine

## 2023-11-29 ENCOUNTER — Other Ambulatory Visit (INDEPENDENT_AMBULATORY_CARE_PROVIDER_SITE_OTHER): Payer: Self-pay | Admitting: Otolaryngology

## 2023-11-29 DIAGNOSIS — K1123 Chronic sialoadenitis: Secondary | ICD-10-CM

## 2023-11-29 DIAGNOSIS — R1032 Left lower quadrant pain: Secondary | ICD-10-CM | POA: Diagnosis not present

## 2023-11-29 DIAGNOSIS — K7689 Other specified diseases of liver: Secondary | ICD-10-CM | POA: Diagnosis not present

## 2023-11-29 DIAGNOSIS — K5900 Constipation, unspecified: Secondary | ICD-10-CM

## 2023-11-29 DIAGNOSIS — R110 Nausea: Secondary | ICD-10-CM

## 2023-11-29 DIAGNOSIS — K8020 Calculus of gallbladder without cholecystitis without obstruction: Secondary | ICD-10-CM | POA: Diagnosis not present

## 2023-11-29 DIAGNOSIS — K115 Sialolithiasis: Secondary | ICD-10-CM

## 2023-11-29 DIAGNOSIS — R1310 Dysphagia, unspecified: Secondary | ICD-10-CM

## 2023-11-29 LAB — POCT I-STAT CREATININE: Creatinine, Ser: 2.4 mg/dL — ABNORMAL HIGH (ref 0.44–1.00)

## 2023-11-29 MED ORDER — AMOXICILLIN-POT CLAVULANATE 875-125 MG PO TABS: 1.0000 | ORAL_TABLET | Freq: Two times a day (BID) | ORAL | 0 refills | Status: DC

## 2023-11-29 MED ORDER — IOHEXOL 300 MG/ML  SOLN: 30.0000 mL | Freq: Once | INTRAMUSCULAR | Status: DC | PRN

## 2023-11-29 MED ORDER — METHYLPREDNISOLONE 4 MG PO TBPK: ORAL_TABLET | ORAL | 1 refills | Status: DC

## 2023-11-29 NOTE — Progress Notes (Signed)
 Recurrent swelling and pain around SMG gland hx of stone, Rx for abx and steroid sent, patient to do warm compresses salivary gland massage and sour candy, if sx persist she will make an appt.

## 2023-12-02 ENCOUNTER — Other Ambulatory Visit: Payer: Self-pay | Admitting: Family

## 2023-12-02 ENCOUNTER — Other Ambulatory Visit: Payer: Self-pay | Admitting: Internal Medicine

## 2023-12-03 ENCOUNTER — Encounter: Payer: Self-pay | Admitting: Internal Medicine

## 2023-12-05 ENCOUNTER — Ambulatory Visit: Payer: 59

## 2023-12-06 NOTE — Progress Notes (Signed)
 Remote pacemaker transmission.

## 2023-12-07 ENCOUNTER — Other Ambulatory Visit: Payer: Self-pay | Admitting: Family

## 2023-12-07 DIAGNOSIS — R899 Unspecified abnormal finding in specimens from other organs, systems and tissues: Secondary | ICD-10-CM

## 2023-12-13 NOTE — Progress Notes (Signed)
 Cardiology Office Note   Date:  12/14/2023   ID:  [name], DOB Jul 27, 1936, MRN [id number]  PCP:  Olive Bass, FNP  Cardiologist:   Dietrich Pates, MD   F/U of PAF, CAD, HTN and mitral stenosis     History of Present Illness: [name] is a 88 y.o. female with a history of HTN, DM, mod CAD (CT in 2017 with calcium score of  51  mod dz LAD; filling defect in LAA), paroxysmal atrial fibrillation   2021 Pt hospitalized for dyspnea.  Found to be in CHB   She underwent PPM implant, followed by [name]     Patient admitted 12/28/21 with NSTEMI.  She underwent  PTCA/ DES to the  LAD,probably lost her diagonal. ILVEF was 30-35%,  Hospital course was  complicated by AKI,    I saw the pt in Oct 2024  She was seen by Margaretha Glassing in the interval  Since seen she says her breathing is stable   She denies PND   She is not too active  She does note tingling in L arm    Not associated with activity     No SOB    Gets nauseated   Appetite is up and down    She has taken her BP meds this am Current Meds  Medication Sig   acetaminophen (TYLENOL) 325 MG tablet Take 650 mg by mouth every 6 (six) hours as needed for pain.   albuterol (PROAIR HFA) 108 (90 Base) MCG/ACT inhaler 2 puffs every 4 hours as needed only  if your can't catch your breath   albuterol (PROVENTIL) (2.5 MG/3ML) 0.083% nebulizer solution INHALE 3 ML BY NEBULIZATION EVERY 6 HOURS AS NEEDED FOR WHEEZING OR SHORTNESS OF BREATH   Ascorbic Acid (VITAMIN C) 1000 MG tablet Take 1,000 mg by mouth daily.   atorvastatin (LIPITOR) 80 MG tablet TAKE 1 TABLET BY MOUTH EVERY DAY   azelastine (ASTELIN) 0.1 % nasal spray Place 2 sprays into both nostrils 2 (two) times daily as needed for allergies. Use in each nostril as directed   BD VEO INSULIN SYRINGE U/F 31G X 15/64" 0.5 ML MISC USE AS INSTRUCTED   benzonatate (TESSALON) 200 MG capsule Take 1 capsule (200 mg total) by mouth 3 (three) times daily as needed.   Blood Glucose Monitoring Suppl  (ONETOUCH VERIO FLEX SYSTEM) w/Device KIT USE AS DIRECTED   budesonide-formoterol (SYMBICORT) 160-4.5 MCG/ACT inhaler TAKE 2 PUFFS FIRST THING IN MORNING AND THEN ANOTHER 2 PUFFS ABOUT 12 HOURS LATER.   carvedilol (COREG) 3.125 MG tablet TAKE 1 TABLET BY MOUTH TWICE A DAY WITH FOOD   cetirizine (ZYRTEC ALLERGY) 10 MG tablet Take 1 tablet (10 mg total) by mouth daily.   cholecalciferol (VITAMIN D3) 25 MCG (1000 UNIT) tablet Take 1,000 Units by mouth daily. Isn't taking regularly   clopidogrel (PLAVIX) 75 MG tablet TAKE 1 TABLET BY MOUTH DAILY WITH BREAKFAST.   denosumab (PROLIA) 60 MG/ML SOSY injection Inject 60 mg into the skin every 6 (six) months. Dx code: M81.0.  Pt has appointment on 11/07/23   diclofenac Sodium (VOLTAREN) 1 % GEL Apply 4 g topically 4 (four) times daily as needed (pain).   ELIQUIS 2.5 MG TABS tablet TAKE 1 TABLET BY MOUTH TWICE A DAY   famotidine (PEPCID) 20 MG tablet Take 1 tablet (20 mg total) by mouth 2 (two) times daily.   FARXIGA 5 MG TABS tablet TAKE 1 TABLET BY MOUTH EVERY DAY BEFORE  BREAKFAST   fluticasone (FLONASE) 50 MCG/ACT nasal spray Place 2 sprays into both nostrils daily.   furosemide (LASIX) 40 MG tablet TAKE 1 TABLET BY MOUTH TWICE A DAY   glucose blood (ONETOUCH VERIO) test strip 1 each by Other route 2 (two) times daily. And lancets 2/day   glucose blood test strip Check blood sugar twice a day   hydrALAZINE (APRESOLINE) 25 MG tablet Take 3 tablets (75 mg total) by mouth 3 (three) times daily.   insulin lispro (HUMALOG) 100 UNIT/ML injection Give 3 units with BREAKFAST, AND 18 units with SUPPER   latanoprost (XALATAN) 0.005 % ophthalmic solution Place 1 drop into both eyes at bedtime.   montelukast (SINGULAIR) 10 MG tablet Take 1 tablet (10 mg total) by mouth at bedtime. NEEDS APPT FOR REFILLS   nitroGLYCERIN (NITROSTAT) 0.4 MG SL tablet Place 1 tablet (0.4 mg total) under the tongue every 5 (five) minutes x 3 doses as needed for chest pain.   ondansetron  (ZOFRAN) 8 MG tablet TAKE 1 TABLET BY MOUTH EVERY 8 HOURS AS NEEDED FOR NAUSEA OR VOMITING.   pantoprazole (PROTONIX) 40 MG tablet Take 1 tablet (40 mg total) by mouth daily.   potassium chloride (KLOR-CON) 10 MEQ tablet TAKE 1 TABLET BY MOUTH EVERY DAY   promethazine-dextromethorphan (PROMETHAZINE-DM) 6.25-15 MG/5ML syrup TAKE 5 MLS BY MOUTH 4 TIMES A DAY AS NEEDED FOR COUGH   sodium bicarbonate 650 MG tablet Take 650 mg by mouth 2 (two) times daily.   Syringe/Needle, Disp, (SYRINGE 3CC/25GX1") 25G X 1" 3 ML MISC Use to inject into the skin 2x a day   traZODone (DESYREL) 50 MG tablet Take 0.5 tablets (25 mg total) by mouth at bedtime as needed for sleep.   vitamin B-12 (CYANOCOBALAMIN) 100 MCG tablet Take 100 mcg by mouth daily.   [DISCONTINUED] amoxicillin-clavulanate (AUGMENTIN) 875-125 MG tablet Take 1 tablet by mouth 2 (two) times daily.   [DISCONTINUED] methylPREDNISolone (MEDROL DOSEPAK) 4 MG TBPK tablet Take with signs of chronic sinusitis and take as directed   Current Facility-Administered Medications for the 12/14/23 encounter (Office Visit) with Pricilla Riffle, MD  Medication   [START ON 04/05/2024] denosumab (PROLIA) injection 60 mg        Allergies:   Aspirin, Ramipril, and Doxycycline   Past Medical History:  Diagnosis Date   Allergic rhinitis    Anterior chest wall pain    Anxiety    Asthma    Chronic diastolic CHF (congestive heart failure) (HCC)    Echo 01/2020: EF 60-65, normal wall motion, mild LVH, normal RV SF, RVSP 52.6 (moderate elevation), severe LAE, moderate RAE, trivial MR, mild MS (mean gradient 5.5 mmHg), mild aortic valve sclerosis (no AS); elevated E/e' c/w elevated LVEDP   Cough    DM type 2 (diabetes mellitus, type 2) (HCC)    GERD (gastroesophageal reflux disease)    History of diverticulitis of colon    HTN (hypertension)    Hyperlipidemia    IBS (irritable bowel syndrome)    Iron deficiency anemia    Mitral stenosis 04/17/2022   Echo 04/2022: EF  55-60, no RWMA, mild LVH, normal RVSF, normal PASP, moderate LAE, moderate mitral stenosis (mean gradient 4 mmHg), trivial MR, trivial AI, AV sclerosis without stenosis   Osteoporosis, unspecified    Renal insufficiency 07/31/2017   UTI (urinary tract infection)     Past Surgical History:  Procedure Laterality Date   CARDIOVASCULAR STRESS TEST  02/25/04   CORONARY BALLOON ANGIOPLASTY N/A 12/28/2021  Procedure: CORONARY BALLOON ANGIOPLASTY;  Surgeon: Kathleene Hazel, MD;  Location: [facility] INVASIVE [facility];  Service: Cardiovascular;  Laterality: N/A;   CORONARY STENT INTERVENTION N/A 12/28/2021   Procedure: CORONARY STENT INTERVENTION;  Surgeon: Kathleene Hazel, MD;  Location: [facility] INVASIVE CV [facility];  Service: Cardiovascular;  Laterality: N/A;   ESOPHAGOGASTRODUODENOSCOPY  12/26/01   PACEMAKER IMPLANT N/A 08/02/2020   Procedure: PACEMAKER IMPLANT;  Surgeon: Duke Salvia, MD;  Location: [facility] INVASIVE CV LAB;  Service: Cardiovascular;  Laterality: N/A;   RIGHT OOPHORECTOMY  jan 2010   RIGHT/LEFT HEART CATH AND CORONARY ANGIOGRAPHY N/A 12/28/2021   Procedure: RIGHT/LEFT HEART CATH AND CORONARY ANGIOGRAPHY;  Surgeon: Kathleene Hazel, MD;  Location: [facility] INVASIVE CV [facility];  Service: Cardiovascular;  Laterality: N/A;     Social History:  The patient  reports that she quit smoking about 37 years ago. Her smoking use included cigarettes. She started smoking about 42 years ago. She has a 1.5 pack-year smoking history. She has never used smokeless tobacco. She reports that she does not drink alcohol and does not use drugs.   Family History:  The patient's family history includes Cancer in her sister; Diabetes in her daughter; Hypertension in her mother; Lung cancer in her brother; Melanoma in her brother; Other in her father; Stroke in her mother.    ROS:  Please see the history of present illness. All other systems are reviewed and  Negative to the above problem except as noted.    PHYSICAL  EXAM: VS:  BP (!) 142/90 (BP Location: Left Arm)   Pulse 73   Ht 5\' 6"  (1.676 m)   Wt 77.8 kg   SpO2 100%   BMI 27.70 kg/m   GEN:  Overweight 88 yo  in no acute distress Examined in chair HEENT: normal  Neck: JVP not elevated  Cardiac: RRR No murmurs  Tr LE edema  Respiratory:  clear to auscultation GI: soft, nontender, nondistended,  No hepatomegaly    EKG:  EKG is not done today    Echo  July 2024     1. Left ventricular ejection fraction, by estimation, is 55 to 60%. The  left ventricle has normal function. The left ventricle has no regional  wall motion abnormalities. There is mild concentric left ventricular  hypertrophy. Left ventricular diastolic  function could not be evaluated.   2. Right ventricular systolic function is normal. The right ventricular  size is normal. There is normal pulmonary artery systolic pressure.   3. Left atrial size was severely dilated.   4. Right atrial size was severely dilated.   5. Mitral valve mean gradient 5 mmHg compared with 4 mmHg 04/2022. The  mitral valve is degenerative. No evidence of mitral valve regurgitation.  Moderate mitral stenosis. Moderate mitral annular calcification.   6. The aortic valve is tricuspid. There is mild calcification of the  aortic valve. There is mild thickening of the aortic valve. Aortic valve  regurgitation is not visualized. No aortic stenosis is present.   7. The inferior vena cava is normal in size with greater than 50%  respiratory variability, suggesting right atrial pressure of 3 mmHg.  Echo   04/17/22  ompared to echo from March 2023, LVEF is improved. . Left ventricular ejection fraction, by estimation, is 55 to 60%. The left ventricle has normal function. The left ventricle has no regional wall motion abnormalities. The left ventricular internal cavity size was mildly dilated. There is mild left ventricular hypertrophy. Left ventricular diastolic  parameters are indeterminate. 1. Right  ventricular systolic function is normal. The right ventricular size is normal. There is normal pulmonary artery systolic pressure. 2. 3. Left atrial size was moderately dilated. MV is thickened with restricted motion. Peak and mean gradients through the valve are 12 and 4 mm Hg MVA (VTI) is calculated at 1.12 cm2 Consistent with moderate MS. (HR 60 bpm) Compared to previous echo mean gradient is mildly decreased (6.5 to 4 mm Hg). Trivial mitral valve regurgitation. Moderate mitral annular calcification. 4. The aortic valve is tricuspid. Aortic valve regurgitation is trivial. Aortic valve sclerosis is present, with no evidence of aortic valve stenosis. 5. The inferior vena cava is normal in size with greater than 50% respiratory variability, suggesting right atrial pressure of 3 mmHg. R and L heart cath  12/28/21    Mid RCA lesion is 30% stenosed.   Dist RCA lesion is 50% stenosed.   1st Diag lesion is 90% stenosed.   Prox LAD to Mid LAD lesion is 95% stenosed.   A drug-eluting stent was successfully placed using a SYNERGY XD 3.0X16.   Balloon angioplasty was performed using a BALLN SAPPHIRE 2.0X12.   Post intervention, there is a 0% residual stenosis.   Post intervention, there is a 30% residual stenosis.   Severe proximal LAD stenosis involving a moderate caliber diagonal branch.  Successful PTCA/DES x 1 proximal LAD Successful balloon angioplasty Diagonal 1 No obstructive disease in the Circumflex artery Moderate non-obstructive disease in the dominant RCA Elevated right and left heart pressures.    Recommendations: Continue DAPT with ASA and Plavix for one month and can then stop ASA since she will also be on Eliquis. She remains volume overload. Will need continued diuresis with caution given renal insufficiency    Lipid Panel    Component Value Date/Time   CHOL 105 02/05/2023 0921   CHOL 135 07/28/2021 0852   TRIG 89.0 02/05/2023 0921   TRIG 83 08/07/2006 1419   HDL  47.00 02/05/2023 0921   HDL 64 07/28/2021 0852   CHOLHDL 2 02/05/2023 0921   VLDL 17.8 02/05/2023 0921   LDLCALC 41 02/05/2023 0921   LDLCALC 58 07/28/2021 0852      Wt Readings from Last 3 Encounters:  12/14/23 77.8 kg  11/16/23 80.7 kg  11/14/23 81.6 kg      ASSESSMENT AND PLAN:  1  CAD  2023  Pt s/p NSTEMI  with intervention to LAD.     Remains on Plavix  I do not think arm tingling is angina Follow    2  Hx Atrial fibrillation   Permanent   Continue ELiquis Pt asymptomatic    3  PPM  Hx of CHB  Follows  with EP    4   MV dz   Moderate MS by echo July 2024   Mean gradient is 4   Follow   Pln for echo later this summer    5  HFpEF  Volume status appears good   6    HTN  Pt has not had meds yet    Continue      7 HL   LDL is 41  HDL 47     8.   Renal  Follows in renal clinic  Last Cr 2.4      Signed, Dietrich Pates, MD  12/14/2023 9:17 AM    [facility] [address] [place], [place], [place]  [address] Phone: [phone]; Fax: [phone]

## 2023-12-14 ENCOUNTER — Ambulatory Visit: Payer: 59 | Attending: Internal Medicine | Admitting: Internal Medicine

## 2023-12-14 VITALS — BP 142/90 | HR 73 | Ht 66.0 in | Wt 171.6 lb

## 2023-12-14 DIAGNOSIS — I5032 Chronic diastolic (congestive) heart failure: Secondary | ICD-10-CM | POA: Diagnosis not present

## 2023-12-14 NOTE — Patient Instructions (Addendum)
 Medication Instructions:   *If you need a refill on your cardiac medications before your next appointment, please call your pharmacy*   Lab Work:  If you have labs (blood work) drawn today and your tests are completely normal, you will receive your results only by: MyChart Message (if you have MyChart) OR A paper copy in the mail If you have any lab test that is abnormal or we need to change your treatment, we will call you to review the results.   Testing/Procedures: CHANGE TO AUGUST JUST BEFORE SHE IS SEEN  Your physician has requested that you have an echocardiogram. Echocardiography is a painless test that uses sound waves to create images of your heart. It provides your doctor with information about the size and shape of your heart and how well your heart's chambers and valves are working. This procedure takes approximately one hour. There are no restrictions for this procedure. Please do NOT wear cologne, perfume, aftershave, or lotions (deodorant is allowed). Please arrive 15 minutes prior to your appointment time.  Please note: We ask at that you not bring children with you during ultrasound (echo/ vascular) testing. Due to room size and safety concerns, children are not allowed in the ultrasound rooms during exams. Our front office staff cannot provide observation of children in our lobby area while testing is being conducted. An adult accompanying a patient to their appointment will only be allowed in the ultrasound room at the discretion of the ultrasound technician under special circumstances. We apologize for any inconvenience.    Follow-Up: At [facility], you and your health needs are our priority.  As part of our continuing mission to provide you with exceptional heart care, we have created designated Provider Care Teams.  These Care Teams include your primary Cardiologist (physician) and Advanced Practice Providers (APPs -  Physician Assistants and Nurse  Practitioners) who all work together to provide you with the care you need, when you need it.  We recommend signing up for the patient portal called "MyChart".  Sign up information is provided on this After Visit Summary.  MyChart is used to connect with patients for Virtual Visits (Telemedicine).  Patients are able to view lab/test results, encounter notes, upcoming appointments, etc.  Non-urgent messages can be sent to your provider as well.   To learn more about what you can do with MyChart, go to [email or web address].    Your next appointment:  CHANGE HER JUNE ECHO APPT TO AUGUST AND SEE DR Pernell Dupre AFTER

## 2023-12-17 ENCOUNTER — Other Ambulatory Visit: Payer: Self-pay

## 2023-12-17 MED ORDER — CLOPIDOGREL BISULFATE 75 MG PO TABS: 75.0000 mg | ORAL_TABLET | Freq: Every day | ORAL | 3 refills | Status: AC

## 2023-12-18 ENCOUNTER — Ambulatory Visit: Payer: 59 | Admitting: Audiologist

## 2023-12-18 ENCOUNTER — Other Ambulatory Visit: Payer: Self-pay | Admitting: Internal Medicine

## 2023-12-21 ENCOUNTER — Encounter: Payer: Self-pay | Admitting: Internal Medicine

## 2023-12-21 DIAGNOSIS — M79642 Pain in left hand: Secondary | ICD-10-CM | POA: Diagnosis not present

## 2023-12-25 ENCOUNTER — Ambulatory Visit: Payer: 59 | Admitting: Nurse Practitioner

## 2023-12-25 ENCOUNTER — Encounter: Payer: Self-pay | Admitting: Family

## 2023-12-25 ENCOUNTER — Ambulatory Visit (INDEPENDENT_AMBULATORY_CARE_PROVIDER_SITE_OTHER): Admitting: Family

## 2023-12-25 VITALS — BP 116/68 | HR 64 | Ht 66.0 in | Wt 173.0 lb

## 2023-12-25 DIAGNOSIS — N9089 Other specified noninflammatory disorders of vulva and perineum: Secondary | ICD-10-CM | POA: Diagnosis not present

## 2023-12-25 DIAGNOSIS — M25562 Pain in left knee: Secondary | ICD-10-CM

## 2023-12-25 DIAGNOSIS — J019 Acute sinusitis, unspecified: Secondary | ICD-10-CM

## 2023-12-25 DIAGNOSIS — R899 Unspecified abnormal finding in specimens from other organs, systems and tissues: Secondary | ICD-10-CM | POA: Diagnosis not present

## 2023-12-25 DIAGNOSIS — B9689 Other specified bacterial agents as the cause of diseases classified elsewhere: Secondary | ICD-10-CM | POA: Diagnosis not present

## 2023-12-25 DIAGNOSIS — J209 Acute bronchitis, unspecified: Secondary | ICD-10-CM

## 2023-12-25 MED ORDER — PROMETHAZINE-DM 6.25-15 MG/5ML PO SYRP: 5.0000 mL | ORAL_SOLUTION | Freq: Four times a day (QID) | ORAL | 0 refills | Status: DC | PRN

## 2023-12-25 MED ORDER — DICLOFENAC SODIUM 1 % EX GEL: 4.0000 g | Freq: Four times a day (QID) | CUTANEOUS | 0 refills | Status: AC | PRN

## 2023-12-25 NOTE — Progress Notes (Signed)
 [name] is a 88 y.o. female with the following history as recorded in Epic[employer]:  Patient Active Problem List   Diagnosis Date Noted   Acute bronchitis 05/03/2023   Acute bacterial rhinosinusitis 05/03/2023   Osteoarthritis of right knee 07/04/2022   CKD (chronic kidney disease) stage 4, GFR 15-29 ml/min (HCC) 06/27/2022   Mitral stenosis 04/17/2022   Acute kidney injury superimposed on chronic kidney disease (HCC) 01/02/2022   NSTEMI (non-ST elevated myocardial infarction) (HCC) 12/23/2021   Osteoarthritis of knee 03/30/2021   Bilateral hand pain 03/08/2021   Complete heart block (HCC) 12/13/2020   Pacemaker - MDT 12/13/2020   DOE (dyspnea on exertion) 07/29/2020   Encntr for surgical aftcr following surgery on the circ sys 07/26/2020   Hypertensive heart and chronic kidney disease with heart failure and stage 1 through stage 4 chronic kidney disease, or unspecified chronic kidney disease (HCC) 07/26/2020   Pain in right knee 03/17/2020   Chronic combined systolic (congestive) and diastolic (congestive) heart failure (HCC)    DKA (diabetic ketoacidosis) (HCC) 12/20/2019   Diverticulosis 12/10/2019   Age-related osteoporosis without current pathological fracture 10/03/2019   Chronic kidney disease, stage 3 unspecified (HCC) 10/03/2019   Long term (current) use of insulin (HCC) 10/03/2019   Hyperlipidemia, unspecified 10/03/2019   Long term (current) use of inhaled steroids 10/03/2019   Shortness of breath 01/30/2018   Acute bronchiolitis 12/17/2017   Wheezing 10/29/2017   Vitamin D deficiency 07/31/2017   Renal insufficiency 07/31/2017   Knee pain 06/01/2017   Morbid obesity due to excess calories (HCC) 01/25/2017   Diabetes (HCC) 04/12/2016   Cough 01/20/2015   Atrial fibrillation (HCC) 05/28/2014   History of colonic polyps 05/28/2014   Vomiting 01/29/2014   CAD (coronary artery disease) 09/01/2013   Routine general medical examination at a health care facility  07/17/2011   Encounter for long-term (current) use of other medications 07/06/2011   Nonspecific (abnormal) findings on radiological and other examination of body structure 11/09/2009   IRRITABLE BOWEL SYNDROME 05/07/2009   CHEST PAIN 05/07/2009   ADNEXAL MASS, RIGHT 07/22/2008   HEMORRHOIDS, RECURRENT 01/27/2008   Diverticulitis 01/27/2008   OTH ABNORMAL FIND RAD EXAMINATION BREAST 01/27/2008   GERD 01/13/2008   UTI 09/28/2007   Dyslipidemia 05/27/2007   ANEMIA-IRON DEFICIENCY 05/27/2007   ANXIETY 05/27/2007   Essential hypertension 05/27/2007   Seasonal allergic rhinitis 05/27/2007   Cough variant asthma 05/27/2007   Osteoporosis 05/27/2007   DIVERTICULITIS, HX OF 05/27/2007    Current Outpatient Medications  Medication Sig Dispense Refill   acetaminophen (TYLENOL) 325 MG tablet Take 650 mg by mouth every 6 (six) hours as needed for pain.     albuterol (PROAIR HFA) 108 (90 Base) MCG/ACT inhaler 2 puffs every 4 hours as needed only  if your can't catch your breath 18 g 11   albuterol (PROVENTIL) (2.5 MG/3ML) 0.083% nebulizer solution INHALE 3 ML BY NEBULIZATION EVERY 6 HOURS AS NEEDED FOR WHEEZING OR SHORTNESS OF BREATH 180 mL 1   Ascorbic Acid (VITAMIN C) 1000 MG tablet Take 1,000 mg by mouth daily.     atorvastatin (LIPITOR) 80 MG tablet TAKE 1 TABLET BY MOUTH EVERY DAY 90 tablet 3   azelastine (ASTELIN) 0.1 % nasal spray Place 2 sprays into both nostrils 2 (two) times daily as needed for allergies. Use in each nostril as directed 30 mL 5   BD VEO INSULIN SYRINGE U/F 31G X 15/64" 0.5 ML MISC USE AS INSTRUCTED 100 each 3  benzonatate (TESSALON) 200 MG capsule Take 1 capsule (200 mg total) by mouth 3 (three) times daily as needed. 30 capsule 1   Blood Glucose Monitoring Suppl (ONETOUCH VERIO FLEX SYSTEM) w/Device KIT USE AS DIRECTED 1 kit .   budesonide-formoterol (SYMBICORT) 160-4.5 MCG/ACT inhaler TAKE 2 PUFFS FIRST THING IN MORNING AND THEN ANOTHER 2 PUFFS ABOUT 12 HOURS LATER.  30.6 each 6   carvedilol (COREG) 3.125 MG tablet TAKE 1 TABLET BY MOUTH TWICE A DAY WITH FOOD 180 tablet 2   cetirizine (ZYRTEC ALLERGY) 10 MG tablet Take 1 tablet (10 mg total) by mouth daily. 30 tablet 5   cholecalciferol (VITAMIN D3) 25 MCG (1000 UNIT) tablet Take 1,000 Units by mouth daily. Isn't taking regularly     clopidogrel (PLAVIX) 75 MG tablet Take 1 tablet (75 mg total) by mouth daily with breakfast. 90 tablet 3   denosumab (PROLIA) 60 MG/ML SOSY injection Inject 60 mg into the skin every 6 (six) months. Dx code: M81.0.  Pt has appointment on 11/07/23 1 mL 0   ELIQUIS 2.5 MG TABS tablet TAKE 1 TABLET BY MOUTH TWICE A DAY 60 tablet 5   famotidine (PEPCID) 20 MG tablet Take 1 tablet (20 mg total) by mouth 2 (two) times daily. 180 tablet 2   FARXIGA 5 MG TABS tablet TAKE 1 TABLET BY MOUTH EVERY DAY BEFORE BREAKFAST 30 tablet 3   fluticasone (FLONASE) 50 MCG/ACT nasal spray Place 2 sprays into both nostrils daily. 16 g 5   furosemide (LASIX) 40 MG tablet TAKE 1 TABLET BY MOUTH TWICE A DAY 180 tablet 3   glucose blood (ONETOUCH VERIO) test strip 1 each by Other route 2 (two) times daily. And lancets 2/day 200 each 3   glucose blood test strip Check blood sugar twice a day 100 each 12   hydrALAZINE (APRESOLINE) 25 MG tablet Take 3 tablets (75 mg total) by mouth 3 (three) times daily. 810 tablet 3   insulin lispro (HUMALOG) 100 UNIT/ML injection Give 3 units with BREAKFAST, AND 18 units with SUPPER 60 mL 1   latanoprost (XALATAN) 0.005 % ophthalmic solution Place 1 drop into both eyes at bedtime.     montelukast (SINGULAIR) 10 MG tablet Take 1 tablet (10 mg total) by mouth at bedtime. NEEDS APPT FOR REFILLS 90 tablet 0   nitroGLYCERIN (NITROSTAT) 0.4 MG SL tablet Place 1 tablet (0.4 mg total) under the tongue every 5 (five) minutes x 3 doses as needed for chest pain. 25 tablet 2   ondansetron (ZOFRAN) 8 MG tablet TAKE 1 TABLET BY MOUTH EVERY 8 HOURS AS NEEDED FOR NAUSEA OR VOMITING. 18 tablet 1    pantoprazole (PROTONIX) 40 MG tablet Take 1 tablet (40 mg total) by mouth daily. 180 tablet 3   potassium chloride (KLOR-CON) 10 MEQ tablet TAKE 1 TABLET BY MOUTH EVERY DAY 90 tablet 2   sodium bicarbonate 650 MG tablet Take 650 mg by mouth 2 (two) times daily.     Syringe/Needle, Disp, (SYRINGE 3CC/25GX1") 25G X 1" 3 ML MISC Use to inject into the skin 2x a day 100 each 3   traZODone (DESYREL) 50 MG tablet Take 0.5 tablets (25 mg total) by mouth at bedtime as needed for sleep. 15 tablet 0   vitamin B-12 (CYANOCOBALAMIN) 100 MCG tablet Take 100 mcg by mouth daily.     diclofenac Sodium (VOLTAREN) 1 % GEL Apply 4 g topically 4 (four) times daily as needed (pain). 150 g 0   promethazine-dextromethorphan (PROMETHAZINE-DM) 6.25-15  MG/5ML syrup Take 5 mLs by mouth 4 (four) times daily as needed for cough. 240 mL 0   Current Facility-Administered Medications  Medication Dose Route Frequency Provider Last Rate Last Admin   [START ON 04/05/2024] denosumab (PROLIA) injection 60 mg  60 mg Subcutaneous Once Olive Bass, FNP        Allergies: Aspirin, Ramipril, and Doxycycline  Past Medical History:  Diagnosis Date   Allergic rhinitis    Anterior chest wall pain    Anxiety    Asthma    Chronic diastolic CHF (congestive heart failure) (HCC)    Echo 01/2020: EF 60-65, normal wall motion, mild LVH, normal RV SF, RVSP 52.6 (moderate elevation), severe LAE, moderate RAE, trivial MR, mild MS (mean gradient 5.5 mmHg), mild aortic valve sclerosis (no AS); elevated E/e' c/w elevated LVEDP   Cough    DM type 2 (diabetes mellitus, type 2) (HCC)    GERD (gastroesophageal reflux disease)    History of diverticulitis of colon    HTN (hypertension)    Hyperlipidemia    IBS (irritable bowel syndrome)    Iron deficiency anemia    Mitral stenosis 04/17/2022   Echo 04/2022: EF 55-60, no RWMA, mild LVH, normal RVSF, normal PASP, moderate LAE, moderate mitral stenosis (mean gradient 4 mmHg), trivial MR,  trivial AI, AV sclerosis without stenosis   Osteoporosis, unspecified    Renal insufficiency 07/31/2017   UTI (urinary tract infection)     Past Surgical History:  Procedure Laterality Date   CARDIOVASCULAR STRESS TEST  02/25/04   CORONARY BALLOON ANGIOPLASTY N/A 12/28/2021   Procedure: CORONARY BALLOON ANGIOPLASTY;  Surgeon: Kathleene Hazel, MD;  Location: [facility];  Service: Cardiovascular;  Laterality: N/A;   CORONARY STENT INTERVENTION N/A 12/28/2021   Procedure: CORONARY STENT INTERVENTION;  Surgeon: Kathleene Hazel, MD;  Location: [facility];  Service: Cardiovascular;  Laterality: N/A;   ESOPHAGOGASTRODUODENOSCOPY  12/26/01   PACEMAKER IMPLANT N/A 08/02/2020   Procedure: PACEMAKER IMPLANT;  Surgeon: Duke Salvia, MD;  Location: [facility];  Service: Cardiovascular;  Laterality: N/A;   RIGHT OOPHORECTOMY  jan 2010   RIGHT/LEFT HEART CATH AND CORONARY ANGIOGRAPHY N/A 12/28/2021   Procedure: RIGHT/LEFT HEART CATH AND CORONARY ANGIOGRAPHY;  Surgeon: Kathleene Hazel, MD;  Location: [facility];  Service: Cardiovascular;  Laterality: N/A;    Family History  Problem Relation Age of Onset   Hypertension Mother    Stroke Mother    Other Father        poor circulation   Cancer Sister    Lung cancer Brother    Melanoma Brother    Diabetes Daughter    Colon cancer Neg Hx    Liver disease Neg Hx    Esophageal cancer Neg Hx     Social History   Tobacco Use   Smoking status: Former    Current packs/day: 0.00    Average packs/day: 0.3 packs/day for 5.0 years (1.5 ttl pk-yrs)    Types: Cigarettes    Start date: 10/02/1981    Quit date: 10/02/1986    Years since quitting: 37.2   Smokeless tobacco: Never  Substance Use Topics   Alcohol use: No    Subjective:   Accompanied by daughter; Left knee pain x 3 days; no known injury; does have known arthritis; normally gets injections in right knee;  Was told by GI to follow up here to  discuss finding on recent abd/ pelvic CT;  Also  requesting refill on Promethazine DM that her pulmonologist gives her to help with recurrent cough;    Objective:  Vitals:   12/25/23 1036  BP: 116/68  Pulse: 64  SpO2: 100%  Weight: 173 lb (78.5 kg)  Height: 5\' 6"  (1.676 m)    General: Well developed, well nourished, in no acute distress  Skin : Warm and dry.  Head: Normocephalic and atraumatic  Lungs: Respirations unlabored;  Musculoskeletal: No deformities; no active joint inflammation  Extremities: No edema, cyanosis, clubbing  Vessels: Symmetric bilaterally  Neurologic: Alert and oriented; speech intact; face symmetrical; moves all extremities well; CNII-XII intact without focal deficit   Assessment:  1. Vulvar lesion   2. Acute bacterial rhinosinusitis   3. Acute bronchitis, unspecified organism   4. Abnormal laboratory test result   5. Acute pain of left knee     Plan:  Update pelvic ultrasound; & 3. Refill updated on cough syrup as requested; 4.   Update lactic acid level today- was drawn by another provider at urgent care visit last month;  5.   Refill updated on Voltaren gel; keep planned follow up with ortho;   No follow-ups on file.  Orders Placed This Encounter  Procedures   US Pelvic Complete With Transvaginal    Standing Status:   Future    Expiration Date:   12/24/2024    Reason for Exam (SYMPTOM  OR DIAGNOSIS REQUIRED):   vulvar lesion    Preferred imaging location?:   [facility]   Lactic acid, plasma    Requested Prescriptions   Signed Prescriptions Disp Refills   diclofenac Sodium (VOLTAREN) 1 % GEL 150 g 0    Sig: Apply 4 g topically 4 (four) times daily as needed (pain).   promethazine-dextromethorphan (PROMETHAZINE-DM) 6.25-15 MG/5ML syrup 240 mL 0    Sig: Take 5 mLs by mouth 4 (four) times daily as needed for cough.

## 2023-12-26 ENCOUNTER — Other Ambulatory Visit: Payer: Self-pay | Admitting: Internal Medicine

## 2023-12-26 LAB — LACTIC ACID, PLASMA: LACTIC ACID: 4.5 mmol/L — ABNORMAL HIGH (ref 0.4–1.8)

## 2023-12-26 MED ORDER — ALBUTEROL SULFATE HFA 108 (90 BASE) MCG/ACT IN AERS: INHALATION_SPRAY | RESPIRATORY_TRACT | 11 refills | Status: AC

## 2023-12-26 NOTE — Telephone Encounter (Signed)
 Copied from CRM [id number]. Topic: Clinical - Medication Refill >> Dec 26, 2023 12:08 PM Nila Nephew wrote: Most Recent Primary Care Visit:  Provider: Ria [facility]  Department: [facility]  Visit Type: OFFICE VISIT  Date: 11/16/2023  Medication: albuterol (PROAIR HFA) 108 (90 Base) MCG/ACT inhaler  Has the patient contacted their pharmacy? Yes - Sttaed needs to contact physician  Is this the correct pharmacy for this prescription? Yes  This is the patient's preferred pharmacy:  [facility] [address] [place] - [address] [place] [address] Phone: [phone] Fax: [phone]    Has the prescription been filled recently? No  Is the patient out of the medication? Yes  Has the patient been seen for an appointment in the last year OR does the patient have an upcoming appointment? Yes  Can we respond through MyChart? No - Phone Calls Preferred  Agent: Please be advised that Rx refills may take up to 3 business days. We ask that you follow-up with your pharmacy.

## 2023-12-27 ENCOUNTER — Encounter: Payer: Self-pay | Admitting: Family

## 2023-12-27 DIAGNOSIS — M79642 Pain in left hand: Secondary | ICD-10-CM | POA: Diagnosis not present

## 2023-12-28 ENCOUNTER — Encounter (INDEPENDENT_AMBULATORY_CARE_PROVIDER_SITE_OTHER): Payer: Self-pay | Admitting: Otolaryngology

## 2023-12-28 ENCOUNTER — Ambulatory Visit (INDEPENDENT_AMBULATORY_CARE_PROVIDER_SITE_OTHER): Payer: 59 | Admitting: Otolaryngology

## 2023-12-28 VITALS — BP 119/68 | HR 83

## 2023-12-28 DIAGNOSIS — R1310 Dysphagia, unspecified: Secondary | ICD-10-CM

## 2023-12-28 DIAGNOSIS — K219 Gastro-esophageal reflux disease without esophagitis: Secondary | ICD-10-CM | POA: Diagnosis not present

## 2023-12-28 DIAGNOSIS — K115 Sialolithiasis: Secondary | ICD-10-CM | POA: Diagnosis not present

## 2023-12-28 DIAGNOSIS — H903 Sensorineural hearing loss, bilateral: Secondary | ICD-10-CM

## 2023-12-28 DIAGNOSIS — H905 Unspecified sensorineural hearing loss: Secondary | ICD-10-CM

## 2023-12-28 DIAGNOSIS — K224 Dyskinesia of esophagus: Secondary | ICD-10-CM

## 2023-12-28 DIAGNOSIS — K1123 Chronic sialoadenitis: Secondary | ICD-10-CM

## 2023-12-28 DIAGNOSIS — H9313 Tinnitus, bilateral: Secondary | ICD-10-CM

## 2023-12-28 MED ORDER — METHYLPREDNISOLONE 4 MG PO TBPK: ORAL_TABLET | ORAL | 1 refills | Status: DC

## 2023-12-28 MED ORDER — AMOXICILLIN-POT CLAVULANATE 875-125 MG PO TABS: 1.0000 | ORAL_TABLET | Freq: Two times a day (BID) | ORAL | 0 refills | Status: DC

## 2023-12-28 NOTE — Progress Notes (Signed)
 ENT Progress Note:  ENT Progress Note:   Update 12/28/2023  Discussed the use of AI scribe software for clinical note transcription with the patient, who gave verbal consent to proceed.  History of Present Illness [name] is a 88 year old female hx of choking on water, and esophageal dysmotility, hx of tinnitus, who presents for follow-up on hearing and swallowing issues. 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  She experiences tinnitus in her left>right ear, although her audiogram indicates symmetric hearing loss in both ears. The audiogram shows normal hearing low-freq, with a slight drop in the mid-frequency range and a more significant drop in high-frequency sounds. She is able to understand 100% of words presented during testing, indicating that her hearing loss is not severely impacting her communication at this time.  She underwent a swallow test which showed a slight decrease in lip seal on left side but no aspiration or penetration, and normal oropharyngeal swallow. The esophagram performed in the past, did not reveal any strictures, but showed small hiatal hernia and esophageal dysmotility/spasm.   She has a history of multiple stones in her left salivary gland, which causes swelling and occasional mild pain, especially when eating. Previous treatments with antibiotics and steroids provided some relief, but the swelling persists. Imaging shows numerous stones within the gland and along the duct.  She is currently on pantoprazole, which was reduced from twice daily to once daily.   Records Reviewed:    Update 11/12/23:  Discussed the use of AI scribe software for clinical note transcription with the patient, who gave verbal consent to proceed.  History of Present Illness   [name] is an 87 year old female who presents with ringing in the left > right ear for years and swelling in the left salivary gland x 5 days. She was referred by Dr.  Dayton Scrape.  She experiences chronic tinnitus in both ears, worse on the left for several years, which has worsened recently. There is no associated ear pain, history of ear infections, or prior ear surgeries. A previous hearing test indicated normal hearing, and she has never been advised to use hearing aids. No dizziness/vertigo.  Swelling in the salivary gland began on following a Prolia injection on Wednesday, 5 days ago. A doctor on Friday attributed the swelling to salivary gland stones, based on results of CT max/face which showed multiple salivary stones in the left SMG gland and duct. She started amoxicillin on Friday, but it has not alleviated her symptoms. The pain is described as a 'little nagging pain' with a severity of 4 out of 10. She has no prior history of this issue.  She has diabetes managed with insulin but does not frequently monitor her blood sugar levels.  No history of smoking or heavy alcohol use.     Initial evaluation 10/31/23 Reason for Consult: concern for salivary gland stones and choking when drinking   HPI: Discussed the use of AI scribe software for clinical note transcription with the patient, who gave verbal consent to proceed.  History of Present Illness   The patient is an 10 yoF presents with throat irritation and choking on liquids. She was referred by Dr. Dayton Scrape for evaluation of salivary stones as well.   Throat irritation and choking on liquids have been ongoing for several years. She experiences choking when drinking water but not with solid foods, although she eats slowly. Occasional pain when swallowing and hoarseness are also reported. A  swallow test was conducted due to these symptoms (esophagram) with evidence of dysmotility and corkscrew lower esophageal configuration suggestive of esophageal spasm. She also had small hiatal hernia.   She reports being told she has submandibular stones. Denies hx of submandibular gland swelling, sialoadenitis  requiring antibiotics or visits to ED/PCP.   She has a history of diabetes and uses insulin for management. She also takes pantoprazole for reflux and occasionally uses a nasal spray, Astelin. She is currently on a blood thinner, Eliquis, and another unspecified medication.  She has a history of asthma and uses an inhaler. She occasionally takes prednisone as needed for breathing difficulties. She has a history of smoking but quit a long time ago. No diagnosis of COPD or emphysema is present.  There is a history of pneumonia, but it occurred a long time ago. No recent weight changes or strokes are reported.  She had a heart attack over a year ago, treated with a stent placement.     Records Reviewed:  ED visit 08/31/23 [name] is a 88 y.o. female with h/o hypertension, CAD, A-fib on Eliquis, diabetes, hypertension presents to the emergency department today for evaluation of left arm tingling for the past few days.  Reports is intermittent without any exacerbating or relieving factors.  She is not having any chest pain or shortness of breath.  She does not feel weak in the arm.  She denies any numbness or loss of sensation into it.  She denies any recent injury. Denies any swelling to the area.  She reports that she has had a heart attack before and was concerned that she was having one again, but did not feel similar to her previous one. Allergic to ASA.  88 y.o. female presents to the ER for evaluation of Left arm tingling intermittently. Differential diagnosis includes but is not limited to cervical radiculopathy, ACS, MSK. Vital signs Mildly elevated BP, borderline bradycardia, otherwise unremarkable. Physical exam as noted above.    CT scan of the head shows 1. No acute intracranial process. 2. 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 Per radiologist's interpretation.     CT cervical spine shows  1. No acute displaced fracture or traumatic listhesis of the  cervical spine. 2. Please see separately dictated CT head 08/31/2023. 3. Left submandibular gland calcifications.    I independently reviewed and interpreted the patient's labs.  CBC shows chronic thrombocytopenia otherwise unremarkable.  No leukocytosis or anemia.  CMP shows glucose of 348 with a decrease in bicarb of 17 however closed anion gap.  Creatinine at baseline.  Sodium mildly low at 134 however likely pseudohyponatremia.  No other electrolyte or LFT abnormality.  Troponin at 21 which is her baseline.  Repeat was at 28.   The patient does have a decrease in bicarb at 17, but has a closed gap. She did not give herself her insulin this morning. Encourage her to remember to take her nightly dose and to be checking her BS more often. Doubt DKA. Recommended fluid and diet changes.    EKG reviewed and interpreted by my attending and read as Ventricular-paced rhythm.   She is not having any chest pain or SOB. She does not feel weak in the arm. She reports she has feeling in the arm, it just feels tingling/pins & needles. Troponins are at the baseline. She is well appearing. Arm symptoms are not reproduced with movement of the head/neck. There are now overlying skin changes. Coloration, size, and temperature  appear and feel symmetric. Equal grip strength. Strength is 5/5 in the patient's bilateral upper extremities. CT head and neck do not shows any signs of acute stroke. 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 She is likely having some radiculopathy from the arthritis in her neck. My attending assessed at bedside and agrees. Will discharge home. Recommend Tylenol and lidocaine patches and following up with PCP.     Past Medical History:  Diagnosis Date   Allergic rhinitis    Anterior chest wall pain    Anxiety    Asthma    Chronic diastolic CHF (congestive heart failure) (HCC)    Echo 01/2020: EF 60-65, normal wall motion, mild LVH, normal RV SF, RVSP 52.6 (moderate elevation),  severe LAE, moderate RAE, trivial MR, mild MS (mean gradient 5.5 mmHg), mild aortic valve sclerosis (no AS); elevated E/e' c/w elevated LVEDP   Cough    DM type 2 (diabetes mellitus, type 2) (HCC)    GERD (gastroesophageal reflux disease)    History of diverticulitis of colon    HTN (hypertension)    Hyperlipidemia    IBS (irritable bowel syndrome)    Iron deficiency anemia    Mitral stenosis 04/17/2022   Echo 04/2022: EF 55-60, no RWMA, mild LVH, normal RVSF, normal PASP, moderate LAE, moderate mitral stenosis (mean gradient 4 mmHg), trivial MR, trivial AI, AV sclerosis without stenosis   Osteoporosis, unspecified    Renal insufficiency 07/31/2017   UTI (urinary tract infection)     Past Surgical History:  Procedure Laterality Date   CARDIOVASCULAR STRESS TEST  02/25/04   CORONARY BALLOON ANGIOPLASTY N/A 12/28/2021   Procedure: CORONARY BALLOON ANGIOPLASTY;  Surgeon: Kathleene Hazel, MD;  Location: [facility] INVASIVE CV [facility];  Service: Cardiovascular;  Laterality: N/A;   CORONARY STENT INTERVENTION N/A 12/28/2021   Procedure: CORONARY STENT INTERVENTION;  Surgeon: Kathleene Hazel, MD;  Location: [facility] INVASIVE CV [facility];  Service: Cardiovascular;  Laterality: N/A;   ESOPHAGOGASTRODUODENOSCOPY  12/26/01   PACEMAKER IMPLANT N/A 08/02/2020   Procedure: PACEMAKER IMPLANT;  Surgeon: Duke Salvia, MD;  Location: [facility] INVASIVE CV LAB;  Service: Cardiovascular;  Laterality: N/A;   RIGHT OOPHORECTOMY  jan 2010   RIGHT/LEFT HEART CATH AND CORONARY ANGIOGRAPHY N/A 12/28/2021   Procedure: RIGHT/LEFT HEART CATH AND CORONARY ANGIOGRAPHY;  Surgeon: Kathleene Hazel, MD;  Location: [facility] INVASIVE CV LAB;  Service: Cardiovascular;  Laterality: N/A;    Family History  Problem Relation Age of Onset   Hypertension Mother    Stroke Mother    Other Father        poor circulation   Cancer Sister    Lung cancer Brother    Melanoma Brother    Diabetes Daughter    Colon cancer Neg Hx    Liver disease  Neg Hx    Esophageal cancer Neg Hx     Social History:  reports that she quit smoking about 37 years ago. Her smoking use included cigarettes. She started smoking about 42 years ago. She has a 1.5 pack-year smoking history. She has never used smokeless tobacco. She reports that she does not drink alcohol and does not use drugs.  Allergies:  Allergies  Allergen Reactions   Aspirin Shortness Of Breath and Other (See Comments)    Caused asthma symptoms   Ramipril Cough   Doxycycline     Nausea    Medications: I have reviewed the patient's current medications.  The PMH, PSH, Medications, Allergies, and SH were reviewed and  updated.  ROS: Constitutional: Negative for fever, weight loss and weight gain. Cardiovascular: Negative for chest pain and dyspnea on exertion. Respiratory: Is not experiencing shortness of breath at rest. Gastrointestinal: Negative for nausea and vomiting. Neurological: Negative for headaches. Psychiatric: The patient is not nervous/anxious  Blood pressure 119/68, pulse 83, SpO2 97%.  PHYSICAL EXAM:  Exam: General: Well-developed, well-nourished Respiratory Respiratory effort: Equal inspiration and expiration without stridor Cardiovascular Peripheral Vascular: Warm extremities with equal color/perfusion Eyes: No nystagmus with equal extraocular motion bilaterally Neuro/Psych/Balance: Patient oriented to person, place, and time; Appropriate mood and affect; Gait is intact with no imbalance; Cranial nerves I-XII are intact Head and Face Inspection: Normocephalic and atraumatic without mass or lesion Salivary Glands: Swelling and mild tenderness of the left SMG gland, palpable stone along the left Wharton's duct near the opening with mild tenderness.  Facial Strength: Facial motility symmetric and full bilaterally ENT Pinna: External ear intact and fully developed External canal: Canals with cerumen and dry skin, cleared (see procedure note) Tympanic  Membrane: Clear once cerumen dry skin removed External Nose: No scar or anatomic deformity Lips, Teeth, and gums: Mucosa and teeth intact and viable TMJ: No pain to palpation  full mobility Oral cavity/oropharynx: No erythema or exudate, no lesions present Neck Neck and Trachea: Midline trachea without mass or lesion Thyroid: No mass or nodularity Lymphatics: No lymphadenopathy   Studies Reviewed: Neck U/S 09/14/23 CLINICAL DATA:  Left submandibular swelling   EXAM: ULTRASOUND OF HEAD/NECK SOFT TISSUES   TECHNIQUE: Ultrasound examination of the head and neck soft tissues was performed in the area of clinical concern.   COMPARISON:  CT 08/31/2023   FINDINGS: Left submandibular calcifications presumably sialoliths corresponding to previous findings on CT. No mass or adenopathy. Limited contralateral images normal.   IMPRESSION: Left submandibular sialoliths.   Esophagram 06/22/23 IMPRESSION: 1. Presbyesophagus with tertiary contractions and corkscrew appearance in the mid and distal esophagus. The contractions and intermittent spasms limit our ability to assess for esophageal narrowings. 2. Initially substantially delayed emptying of the esophagus into the stomach in the upright position. Suspected component of esophageal spasm. Subsequently the esophagus did spontaneously empty into the stomach in the upright position. 3. Small type 1 hiatal hernia. 4. 13 mm barium tablet impacted just above the hiatal hernia.  CT max/face w/o contrast 11/08/23 FINDINGS: Osseous: No acute fracture or destructive process.   Orbits: Bilateral cataract extraction.   Sinuses: Minimal debris in the left sphenoid sinus. Trace right mastoid fluid.   Soft tissues: Asymmetrically enlarged left submandibular gland which appears edematous with mild swelling in the overlying soft tissues. Numerous calculi throughout the left submandibular gland and throughout the left submandibular duct  including a 15 x 7 mm calculus in the distal aspect of the duct. Unremarkable noncontrast appearance of the right submandibular and both parotid glands.   Limited intracranial: Calcified atherosclerosis of the carotid siphons and vertebral arteries.   IMPRESSION: Left submandibular sialoadenitis with numerous stones.    Audiogram  11/22/23 Results:  The test results were reviewed with [name] and her daughter. [name] has a mild hearing loss in both ears. She does not need hearing aids. The loss is the same in both ears. The tinnitus is matched to around [facility] in the left ear. Recommend using masking sounds to cover the tinnitus, especially at night. Repeat hearing tests annually. If new symptoms arise, such as asymmetric hearing loss pain or pressure see PCP or Otolaryngology.  Audiogram printed and provided to [name]. Tinnitus packet provided  to patient, she was counseled on masking concepts and options.     MBS 11/21/23 Clinical Impression: Clinical Impression: Patient presents with a remarkably normal oropharyngeal swallow ability. She is notable to have right facial asymmetry and decreased right labial seal.  No aspiration or penetration and swallow was strong and timely.   Liquid swallows trigger at pyriform sinus and solids at vallecular space.   Patient was not observed to cough prior to, during or after evaluation. She was challenged with sequential swallows of ultra thin barium *half thin barium, 1/2 water*.  Suspect her "choking" and "coughing" with thin liquids is likely due to known esophageal dysphagia including dysmotility, spasms, and hiatal hernia.  Provided education for compensation strategies for known esophageal dysmotility, hiatal hernia, etc verbally. Educated pt and her daughter to remarkable swallow function, especially given pt's advanced age of 70!  No SLP follow up indicated. Thanks so much for this consult of this most pleasant pt and her daughter.       Assessment/Plan: Encounter Diagnoses  Name Primary?   Sialolith Yes   Chronic sialoadenitis    Tinnitus of both ears    Dysphagia, unspecified type    Esophageal dysmotility    Chronic GERD    Sensorineural hearing loss (SNHL) of both ears       Assessment and Plan    Chronic dysphagia with liquids Choking on water for several years, no issues with solid foods. Previous esophagram showed esophageal dysmotility, esophageal spasm, delayed emptying, and small hiatal hernia. Reflux history noted, on PPI currently. Scope exam showed vocal cords moving well and no significant abnormalities at the level of the throat aside from changes c/w GERD LPR. Discussed the need for a modified barium swallow study to evaluate swallowing at the level of her oropharynx. If the study shows safe swallowing, swallow therapy may be considered. - Order modified barium swallow study - Continue pantoprazole at current dose  - consider reflux gourmet  - det and lifestyle changes to minimize GERD   GERD LPR  Importance of continuing reflux medication discussed. - Continue pantoprazole 40 mg daily  - consider reflux gourmet  - det and lifestyle changes to minimize GERD   Salivary Gland Stone Large salivary gland stone visualized in the salivary duct (Left SMG duct). Likely present for a long time due to slow growth rate. No current symptoms of swelling, pain, or discomfort. Discussed that if asymptomatic, it may be best to observe, especially considering  her age. If symptoms develop, removal can be considered. - Monitor for symptoms such as swelling, pain, or discomfort - Consider removal if symptoms develop  Asthma Uses inhaler and takes prednisone as needed for breathing issues. - Continue current asthma management  Diabetes Mellitus On insulin therapy. - Continue current diabetes management  Coronary Artery Disease Treated with stent placement after heart attack over a year ago. Currently on  blood thinners (Eliquis). - Continue Eliquis  Follow-up - Schedule modified barium swallow study - Follow-up appointment after swallow study to review results and determine next steps.      Update 11/12/23  Assessment and Plan  Chronic dysphagia with liquids Choking on water for several years, no issues with solid foods. Previous esophagram showed esophageal dysmotility, esophageal spasm, delayed emptying, and small hiatal hernia. Reflux history noted, on PPI currently. Scope exam showed vocal cords moving well and no significant abnormalities at the level of the throat aside from changes c/w GERD LPR. Discussed the need for a modified barium swallow study  to evaluate swallowing at the level of her oropharynx. If the study shows safe swallowing, swallow therapy may be considered. - Ordered modified barium swallow study - return after swallow study results are available - Continue pantoprazole at current dose  - consider reflux gourmet  - det and lifestyle changes to minimize GERD   GERD LPR  Importance of continuing reflux medication discussed. - Continue pantoprazole 40 mg daily  - consider reflux gourmet  - det and lifestyle changes to minimize GERD      Sialolithiasis with Sialadenitis Swelling and pain in the left salivary gland x 5 days and multiple large salivary stones in L SMG and duct on CT max/face non-con, stone in the duct palpable on exam. Discussed medical management options including antibiotics, steroids, warm compresses, sour cough drops, and pain management. Surgical removal may be necessary if symptoms persist. Risks of steroids discussed due to diabetes, including potential hyperglycemia, and she will check her glucose more frequently. - Discontinue amoxicillin - Prescribe Augmentin x 10 days - Prescribe Medrol Dosepak - Advise increased hydration - Recommend sour cough drops - Advise warm compresses and gland massage - Monitor blood sugar levels due to steroid  use  Tinnitus Chronic left > right ear tinnitus, worsening over time. No associated ear pain. Previous hearing test was normal,  but done years ago. Discussed that tinnitus is often due to hearing loss, necessitating a hearing test to determine type and extent. - Order hearing test - Cleared earwax today prior to testing   Diabetes Mellitus Diabetes managed with insulin. Steroid use may elevate blood sugar levels. Emphasized the importance of monitoring blood sugar and adjusting insulin dosage accordingly. - Monitor blood sugar levels closely - Adjust insulin dosage as needed  Follow-up - Schedule follow-up appointment in a few weeks - Reschedule throat follow-up to coincide with hearing test review.      Update 12/28/2023 Assessment and Plan Assessment & Plan Age-related sensorineural hearing loss symmetric  Symmetric hearing loss with mid-frequency drop, high-frequency loss, c/w presbyacousias. 100% word recognition, minimal communication impact. Hearing aids not needed at this time, could consider in the future.  - Advise annual hearing screening. - Consider hearing aids if communication worsens. - Use hearing protection in noisy environments.  Chronic sialoadenitis, Sialolithiasis with salivary gland swelling on the left Multiple stones in left salivary gland and duct causing swelling and discomfort. Surgery possible but would require left submandibular gland removal. Patient prefers non-surgical management. - Apply warm compresses and take Motrin for discomfort when it occurs. - Prescribe antibiotic and steroid for severe episodes (to be taken only when needed, Augmentin/Medrol Pack). - Advise sucking on sour candies during symptoms.  Esophageal dysmotility Esophagram suggest dysmotility/esophageal spasm no stricture on esophagram. MBS test showed decreased lip seal, no aspiration/penetration, intact OP swallow. No surgical intervention recommended. - Advise slow/intentional  eating/drinking focus on swallowing. - Avoid mixed consistency foods if causing a lot of coughing. - Hold water in mouth briefly before swallowing.  Gastroesophageal reflux disease (GERD) On pantoprazole, dose reduced to once daily. Long-term use not recommended due to side effects. No Barrett's esophagitis hx, follows with GI. Weaning slowly advised to avoid rebound heartburn. - Wean off pantoprazole by gradually skipping doses. - Use Reflux Gourmet supplement during weaning. - Resume pantoprazole if significant heartburn occurs.  Follow-up Annual follow-up recommended for monitoring hearing and ENT issues. - Schedule annual follow-up appointment after hearing test     Ashok Croon, MD Otolaryngology [facility] Phone: [phone] Fax:  [phone]    12/28/2023, 9:06 AM

## 2023-12-31 DIAGNOSIS — H401131 Primary open-angle glaucoma, bilateral, mild stage: Secondary | ICD-10-CM | POA: Diagnosis not present

## 2024-01-01 ENCOUNTER — Other Ambulatory Visit: Payer: Self-pay | Admitting: Family

## 2024-01-01 ENCOUNTER — Ambulatory Visit
Admission: RE | Admit: 2024-01-01 | Discharge: 2024-01-01 | Disposition: A | Discharge status: Home / Self Care | Source: Ambulatory Visit | Attending: Family | Admitting: Family

## 2024-01-01 DIAGNOSIS — B9689 Other specified bacterial agents as the cause of diseases classified elsewhere: Secondary | ICD-10-CM

## 2024-01-01 DIAGNOSIS — M25562 Pain in left knee: Secondary | ICD-10-CM

## 2024-01-01 DIAGNOSIS — N9089 Other specified noninflammatory disorders of vulva and perineum: Secondary | ICD-10-CM

## 2024-01-01 DIAGNOSIS — R899 Unspecified abnormal finding in specimens from other organs, systems and tissues: Secondary | ICD-10-CM

## 2024-01-01 DIAGNOSIS — J209 Acute bronchitis, unspecified: Secondary | ICD-10-CM

## 2024-01-02 ENCOUNTER — Other Ambulatory Visit: Payer: Self-pay | Admitting: Family

## 2024-01-02 DIAGNOSIS — N9089 Other specified noninflammatory disorders of vulva and perineum: Secondary | ICD-10-CM

## 2024-01-07 ENCOUNTER — Other Ambulatory Visit: Payer: Self-pay | Admitting: Internal Medicine

## 2024-01-07 ENCOUNTER — Other Ambulatory Visit: Payer: Self-pay | Admitting: Family

## 2024-01-16 DIAGNOSIS — M25562 Pain in left knee: Secondary | ICD-10-CM | POA: Diagnosis not present

## 2024-01-16 DIAGNOSIS — M25561 Pain in right knee: Secondary | ICD-10-CM | POA: Diagnosis not present

## 2024-01-21 ENCOUNTER — Other Ambulatory Visit: Payer: Self-pay | Admitting: Internal Medicine

## 2024-01-27 ENCOUNTER — Other Ambulatory Visit: Payer: Self-pay | Admitting: "Endocrinology

## 2024-01-27 ENCOUNTER — Other Ambulatory Visit: Payer: Self-pay | Admitting: Internal Medicine

## 2024-01-27 DIAGNOSIS — I4891 Unspecified atrial fibrillation: Secondary | ICD-10-CM

## 2024-01-28 ENCOUNTER — Ambulatory Visit: Payer: 59

## 2024-01-28 DIAGNOSIS — I442 Atrioventricular block, complete: Secondary | ICD-10-CM

## 2024-01-28 LAB — CUP PACEART REMOTE DEVICE CHECK
Battery Remaining Longevity: 122 mo
Battery Voltage: 3.01 V
Brady Statistic RV Percent Paced: 99.79 %
Date Time Interrogation Session: 20250428055526
Implantable Lead Connection Status: 753985
Implantable Lead Implant Date: 20211101
Implantable Lead Location: 753860
Implantable Lead Model: 3830
Implantable Pulse Generator Implant Date: 20211101
Lead Channel Impedance Value: 399 Ohm
Lead Channel Impedance Value: 475 Ohm
Lead Channel Pacing Threshold Amplitude: 0.625 V
Lead Channel Pacing Threshold Pulse Width: 0.4 ms
Lead Channel Sensing Intrinsic Amplitude: 7.625 mV
Lead Channel Sensing Intrinsic Amplitude: 7.625 mV
Lead Channel Setting Pacing Amplitude: 2 V
Lead Channel Setting Pacing Pulse Width: 0.4 ms
Lead Channel Setting Sensing Sensitivity: 0.9 mV
Zone Setting Status: 755011

## 2024-01-29 ENCOUNTER — Encounter: Payer: Self-pay | Admitting: Family

## 2024-01-29 ENCOUNTER — Ambulatory Visit (INDEPENDENT_AMBULATORY_CARE_PROVIDER_SITE_OTHER): Admitting: Family

## 2024-01-29 VITALS — BP 116/72 | HR 73 | Ht 66.0 in | Wt 168.6 lb

## 2024-01-29 DIAGNOSIS — M542 Cervicalgia: Secondary | ICD-10-CM | POA: Diagnosis not present

## 2024-01-29 MED ORDER — TIZANIDINE HCL 2 MG PO TABS: 2.0000 mg | ORAL_TABLET | Freq: Three times a day (TID) | ORAL | 0 refills | Status: AC | PRN

## 2024-01-29 NOTE — Progress Notes (Signed)
 [name] is a 88 y.o. female with the following history as recorded in [employer]:  Patient Active Problem List   Diagnosis Date Noted   Acute bronchitis 05/03/2023   Acute bacterial rhinosinusitis 05/03/2023   Osteoarthritis of right knee 07/04/2022   CKD (chronic kidney disease) stage 4, GFR 15-29 ml/min (HCC) 06/27/2022   Mitral stenosis 04/17/2022   Acute kidney injury superimposed on chronic kidney disease (HCC) 01/02/2022   NSTEMI (non-ST elevated myocardial infarction) (HCC) 12/23/2021   Osteoarthritis of knee 03/30/2021   Bilateral hand pain 03/08/2021   Complete heart block (HCC) 12/13/2020   Pacemaker - MDT 12/13/2020   DOE (dyspnea on exertion) 07/29/2020   Encntr for surgical aftcr following surgery on the circ sys 07/26/2020   Hypertensive heart and chronic kidney disease with heart failure and stage 1 through stage 4 chronic kidney disease, or unspecified chronic kidney disease (HCC) 07/26/2020   Pain in right knee 03/17/2020   Chronic combined systolic (congestive) and diastolic (congestive) heart failure (HCC)    DKA (diabetic ketoacidosis) (HCC) 12/20/2019   Diverticulosis 12/10/2019   Age-related osteoporosis without current pathological fracture 10/03/2019   Chronic kidney disease, stage 3 unspecified (HCC) 10/03/2019   Long term (current) use of insulin  (HCC) 10/03/2019   Hyperlipidemia, unspecified 10/03/2019   Long term (current) use of inhaled steroids 10/03/2019   Shortness of breath 01/30/2018   Acute bronchiolitis 12/17/2017   Wheezing 10/29/2017   Vitamin D  deficiency 07/31/2017   Renal insufficiency 07/31/2017   Knee pain 06/01/2017   Morbid obesity due to excess calories (HCC) 01/25/2017   Diabetes (HCC) 04/12/2016   Cough 01/20/2015   Atrial fibrillation (HCC) 05/28/2014   History of colonic polyps 05/28/2014   Vomiting 01/29/2014   CAD (coronary artery disease) 09/01/2013   Routine general medical examination at a health care facility  07/17/2011   Encounter for long-term (current) use of other medications 07/06/2011   Nonspecific (abnormal) findings on radiological and other examination of body structure 11/09/2009   IRRITABLE BOWEL SYNDROME 05/07/2009   CHEST PAIN 05/07/2009   ADNEXAL MASS, RIGHT 07/22/2008   HEMORRHOIDS, RECURRENT 01/27/2008   Diverticulitis 01/27/2008   OTH ABNORMAL FIND RAD EXAMINATION BREAST 01/27/2008   GERD 01/13/2008   UTI 09/28/2007   Dyslipidemia 05/27/2007   ANEMIA-IRON DEFICIENCY 05/27/2007   ANXIETY 05/27/2007   Essential hypertension 05/27/2007   Seasonal allergic rhinitis 05/27/2007   Cough variant asthma 05/27/2007   Osteoporosis 05/27/2007   DIVERTICULITIS, HX OF 05/27/2007    Current Outpatient Medications  Medication Sig Dispense Refill   acetaminophen  (TYLENOL ) 325 MG tablet Take 650 mg by mouth every 6 (six) hours as needed for pain.     albuterol  (PROAIR  HFA) 108 (90 Base) MCG/ACT inhaler 2 puffs every 4 hours as needed only  if your can't catch your breath 18 g 11   albuterol  (PROVENTIL ) (2.5 MG/3ML) 0.083% nebulizer solution INHALE 3 ML BY NEBULIZATION EVERY 6 HOURS AS NEEDED FOR WHEEZING OR SHORTNESS OF BREATH 180 mL 1   Ascorbic Acid (VITAMIN C) 1000 MG tablet Take 1,000 mg by mouth daily.     atorvastatin  (LIPITOR ) 80 MG tablet TAKE 1 TABLET BY MOUTH EVERY DAY 90 tablet 3   Azelastine  HCl 137 MCG/SPRAY SOLN PLACE 2 SPRAYS INTO BOTH NOSTRILS 2 (TWO) TIMES DAILY AS NEEDED FOR ALLERGIES. USE IN EACH NOSTRIL AS DIRECTED 90 mL 0   BD VEO INSULIN  SYRINGE U/F 31G X 15/64" 0.5 ML MISC USE AS INSTRUCTED 100 each 3   benzonatate  (  TESSALON ) 200 MG capsule Take 1 capsule (200 mg total) by mouth 3 (three) times daily as needed. 30 capsule 1   Blood Glucose Monitoring Suppl (ONETOUCH VERIO FLEX SYSTEM) w/Device KIT USE AS DIRECTED 1 kit .   budesonide -formoterol  (SYMBICORT ) 160-4.5 MCG/ACT inhaler TAKE 2 PUFFS FIRST THING IN MORNING AND THEN ANOTHER 2 PUFFS ABOUT 12 HOURS LATER. 30.6  each 6   carvedilol  (COREG ) 3.125 MG tablet TAKE 1 TABLET BY MOUTH TWICE A DAY WITH FOOD 180 tablet 2   cetirizine  (ZYRTEC  ALLERGY ) 10 MG tablet Take 1 tablet (10 mg total) by mouth daily. 30 tablet 5   cholecalciferol (VITAMIN D3) 25 MCG (1000 UNIT) tablet Take 1,000 Units by mouth daily. Isn't taking regularly     clopidogrel  (PLAVIX ) 75 MG tablet Take 1 tablet (75 mg total) by mouth daily with breakfast. 90 tablet 3   denosumab  (PROLIA ) 60 MG/ML SOSY injection Inject 60 mg into the skin every 6 (six) months. Dx code: M81.0.  Pt has appointment on 11/07/23 1 mL 0   diclofenac  Sodium (VOLTAREN ) 1 % GEL Apply 4 g topically 4 (four) times daily as needed (pain). 150 g 0   ELIQUIS  2.5 MG TABS tablet TAKE 1 TABLET BY MOUTH TWICE A DAY 60 tablet 5   famotidine  (PEPCID ) 20 MG tablet Take 1 tablet (20 mg total) by mouth 2 (two) times daily. 180 tablet 2   FARXIGA  5 MG TABS tablet TAKE 1 TABLET BY MOUTH EVERY DAY BEFORE BREAKFAST 30 tablet 3   fluticasone  (FLONASE ) 50 MCG/ACT nasal spray Place 2 sprays into both nostrils daily. 16 g 5   furosemide  (LASIX ) 40 MG tablet TAKE 1 TABLET BY MOUTH TWICE A DAY 180 tablet 3   glucose blood (ONETOUCH VERIO) test strip 1 each by Other route 2 (two) times daily. And lancets 2/day 200 each 3   glucose blood test strip Check blood sugar twice a day 100 each 12   hydrALAZINE  (APRESOLINE ) 25 MG tablet Take 3 tablets (75 mg total) by mouth 3 (three) times daily. 810 tablet 3   insulin  lispro (HUMALOG ) 100 UNIT/ML injection Give 3 units with BREAKFAST, AND 18 units with SUPPER 60 mL 1   latanoprost  (XALATAN ) 0.005 % ophthalmic solution Place 1 drop into both eyes at bedtime.     methylPREDNISolone  (MEDROL  DOSEPAK) 4 MG TBPK tablet Take with signs of chronic sinusitis and take as directed 1 each 1   montelukast  (SINGULAIR ) 10 MG tablet TAKE 1 TABLET (10 MG TOTAL) BY MOUTH AT BEDTIME. NEEDS APPT FOR REFILLS 90 tablet 0   nitroGLYCERIN  (NITROSTAT ) 0.4 MG SL tablet Place 1  tablet (0.4 mg total) under the tongue every 5 (five) minutes x 3 doses as needed for chest pain. 25 tablet 2   ondansetron  (ZOFRAN ) 8 MG tablet TAKE 1 TABLET BY MOUTH EVERY 8 HOURS AS NEEDED FOR NAUSEA OR VOMITING. 18 tablet 1   pantoprazole  (PROTONIX ) 40 MG tablet Take 1 tablet (40 mg total) by mouth daily. 180 tablet 3   potassium chloride  (KLOR-CON ) 10 MEQ tablet TAKE 1 TABLET BY MOUTH EVERY DAY 90 tablet 2   promethazine -dextromethorphan (PROMETHAZINE -DM) 6.25-15 MG/5ML syrup Take 5 mLs by mouth 4 (four) times daily as needed for cough. 240 mL 0   sodium bicarbonate 650 MG tablet Take 650 mg by mouth 2 (two) times daily.     Syringe/Needle, Disp, (SYRINGE 3CC/25GX1") 25G X 1" 3 ML MISC Use to inject into the skin 2x a day 100 each 3  tiZANidine (ZANAFLEX) 2 MG tablet Take 1 tablet (2 mg total) by mouth every 8 (eight) hours as needed for muscle spasms. 30 tablet 0   traZODone  (DESYREL ) 50 MG tablet Take 0.5 tablets (25 mg total) by mouth at bedtime as needed for sleep. 15 tablet 0   vitamin B-12 (CYANOCOBALAMIN ) 100 MCG tablet Take 100 mcg by mouth daily.     Current Facility-Administered Medications  Medication Dose Route Frequency Provider Last Rate Last Admin   [START ON 04/05/2024] denosumab  (PROLIA ) injection 60 mg  60 mg Subcutaneous Once Adra Alanis, FNP        Allergies: Aspirin , Ramipril, and Doxycycline   Past Medical History:  Diagnosis Date   Allergic rhinitis    Anterior chest wall pain    Anxiety    Asthma    Chronic diastolic CHF (congestive heart failure) (HCC)    Echo 01/2020: EF 60-65, normal wall motion, mild LVH, normal RV SF, RVSP 52.6 (moderate elevation), severe LAE, moderate RAE, trivial MR, mild MS (mean gradient 5.5 mmHg), mild aortic valve sclerosis (no AS); elevated E/e' c/w elevated LVEDP   Cough    DM type 2 (diabetes mellitus, type 2) (HCC)    GERD (gastroesophageal reflux disease)    History of diverticulitis of colon    HTN (hypertension)     Hyperlipidemia    IBS (irritable bowel syndrome)    Iron deficiency anemia    Mitral stenosis 04/17/2022   Echo 04/2022: EF 55-60, no RWMA, mild LVH, normal RVSF, normal PASP, moderate LAE, moderate mitral stenosis (mean gradient 4 mmHg), trivial MR, trivial AI, AV sclerosis without stenosis   Osteoporosis, unspecified    Renal insufficiency 07/31/2017   UTI (urinary tract infection)     Past Surgical History:  Procedure Laterality Date   CARDIOVASCULAR STRESS TEST  02/25/04   CORONARY BALLOON ANGIOPLASTY N/A 12/28/2021   Procedure: CORONARY BALLOON ANGIOPLASTY;  Surgeon: Odie Benne, MD;  Location: [facility]ASIVE CV [facility];  Service: Cardiovascular;  Laterality: N/A;   CORONARY STENT INTERVENTION N/A 12/28/2021   Procedure: CORONARY STENT INTERVENTION;  Surgeon: Odie Benne, MD;  Location: [facility];  Service: Cardiovascular;  Laterality: N/A;   ESOPHAGOGASTRODUODENOSCOPY  12/26/01   PACEMAKER IMPLANT N/A 08/02/2020   Procedure: PACEMAKER IMPLANT;  Surgeon: Verona Goodwill, MD;  Location: [facility] INVASIVE CV [facility];  Service: Cardiovascular;  Laterality: N/A;   RIGHT OOPHORECTOMY  jan 2010   RIGHT/LEFT HEART CATH AND CORONARY ANGIOGRAPHY N/A 12/28/2021   Procedure: RIGHT/LEFT HEART CATH AND CORONARY ANGIOGRAPHY;  Surgeon: Odie Benne, MD;  Location: [facility] INVASIVE CV [facility];  Service: Cardiovascular;  Laterality: N/A;    Family History  Problem Relation Age of Onset   Hypertension Mother    Stroke Mother    Other Father        poor circulation   Cancer Sister    Lung cancer Brother    Melanoma Brother    Diabetes Daughter    Colon cancer Neg Hx    Liver disease Neg Hx    Esophageal cancer Neg Hx     Social History   Tobacco Use   Smoking status: Former    Current packs/day: 0.00    Average packs/day: 0.3 packs/day for 5.0 years (1.5 ttl pk-yrs)    Types: Cigarettes    Start date: 10/02/1981    Quit date: 10/02/1986    Years since quitting: 37.3   Smokeless  tobacco: Never  Substance Use Topics   Alcohol use: No  Subjective:   Accompanied by daughter; had called to make appointment with concerns for pain in her neck last week- was having neck pain and stiffness but notes that symptoms have improved since making the appointment; symptoms did start after waking up one morning;   Objective:  Vitals:   01/29/24 0856  BP: 116/72  Pulse: 73  SpO2: 100%  Weight: 168 lb 9.6 oz (76.5 kg)  Height: 5\' 6"  (1.676 m)    General: Well developed, well nourished, in no acute distress  Skin : Warm and dry.  Head: Normocephalic and atraumatic  Eyes: Sclera and conjunctiva clear; pupils round and reactive to light; extraocular movements intact  Ears: External normal; canals clear; tympanic membranes normal  Oropharynx: Pink, supple. No suspicious lesions  Neck: Supple without thyromegaly, adenopathy  Lungs: Respirations unlabored; clear to auscultation bilaterally without wheeze, rales, rhonchi  CVS exam: normal rate and regular rhythm.  Neurologic: Alert and oriented; speech intact; face symmetrical; moves all extremities well; CNII-XII intact without focal deficit   Assessment:  1. Neck pain     Plan:  Suspect muscular; symptoms are improving per patient; she has been taking Tylenol ; will try adding Flexeril at night; offered PT and patient wants to consider at later time- may need to try home health PT;   No follow-ups on file.  No orders of the defined types were placed in this encounter.   Requested Prescriptions   Signed Prescriptions Disp Refills   tiZANidine (ZANAFLEX) 2 MG tablet 30 tablet 0    Sig: Take 1 tablet (2 mg total) by mouth every 8 (eight) hours as needed for muscle spasms.

## 2024-01-29 NOTE — Telephone Encounter (Signed)
 Requested Prescriptions   Pending Prescriptions Disp Refills   FARXIGA  5 MG TABS tablet [Pharmacy Med Name: FARXIGA  5 MG TABLET] 30 tablet 3    Sig: TAKE 1 TABLET BY MOUTH EVERY DAY BEFORE BREAKFAST

## 2024-01-30 DIAGNOSIS — H401131 Primary open-angle glaucoma, bilateral, mild stage: Secondary | ICD-10-CM | POA: Diagnosis not present

## 2024-01-30 LAB — HM DIABETES EYE EXAM

## 2024-01-30 NOTE — Telephone Encounter (Signed)
 Pt last saw Dr Avanell Bob on 12/14/23, last labs 11/29/23 Creat 2.40, age 88, weight 76.5kg, based on specified criteria pt is on appropriate dosage of Eliquis  2.5mg  BID for afib.  Will refill rx.

## 2024-01-30 NOTE — Telephone Encounter (Signed)
 This is Dr. Luanne Runner pt that the Pharmacy is requesting a refill of Trazodone .

## 2024-01-31 ENCOUNTER — Other Ambulatory Visit: Payer: Self-pay

## 2024-01-31 DIAGNOSIS — I4891 Unspecified atrial fibrillation: Secondary | ICD-10-CM

## 2024-02-04 ENCOUNTER — Encounter: Payer: Self-pay | Admitting: Family

## 2024-02-13 NOTE — Progress Notes (Unsigned)
 Patient ID: [name], female   DOB: Nov 01, 1935 .   MRN: [id number]  Brief patient profile:  87 yobf quit smoking 1988 with h/o asthma  And nl baseline pfts ( as of 01/04/2010) who had been using Advair on a p.r.n. basis and noticing increasing dyspnea and need for rescue therapy x one mo when seen 01/06/08 for pulmonary evaluation so Advair stopped. Began Symbicort  160/4.63mcg 2 puffs two times a day.  Returned 02/13/08 improved with decreased dyspnea and no rescue use   Returned 03/18/08 symptom free no rescue needed, stopped reglan , no flare   05/04/2020  f/u ov/[name] re: asthma/ rhinitis not sure about ppi/ using benadryl helps some  Chief Complaint  Patient presents with   Follow-up    Increased SOB and chest tightness over the past month. She has some cough- prod with min light yellow sputum. She is using her albuterol  inhaler about 2 x per day.   Dyspnea:  MMRC2 = can't walk a nl pace on a flat grade s sob but does fine slow and flat  Cough: more cough assoc with pnds day > noct/ not taking omeprazole   Sleeping: 2 pillows ok  SABA use: 2x daily  02: none  rec Plan A = Automatic = Always=    Symbicort  160 Take 2 puffs first thing in am and then another 2 puffs about 12 hours later.  Work on inhaler technique:    Omerprazole 40 mg Take 30-60 min before first meal of the day  Prednisone  10 mg take  4 each am x 2 days,   2 each am x 2 days,  1 each am x 2 days and stop  Plan B = Backup (to supplement plan A, not to replace it) Only use your albuterol  inhaler as a rescue medication         07/04/2021  f/u ov/[name] re: cough variant asthma   maint on symbicort  160 2bid/ singulair   - no longer on gerd rx  Chief Complaint  Patient presents with   Follow-up    Still coughing some   Dyspnea:  baseline still MMRC2 = can't walk a nl pace on a flat grade s sob but does fine slow and flat eg Mall walking  Cough: worse x one week, slt yellow not on gerd rx  Sleeping: flat bed/ 2 pillows- no  noct symptoms SABA use: started up using it one week prior to OV  twice daily  02: none  Covid status:   2 vax / never got covid  Rec At onset cough the 1st thing you should do :  Try prilosec otc 20mg   Take 30-60 min before first meal of the day and Pepcid  ac (famotidine ) 20 mg one @  bedtime until cough is completely gone   Prednisone  10 mg take  4 each am x 2 days,   2 each am x 2 days,  1 each am x 2 days and stop  Zpak   12/28/21 LHC/RHC   Mid RCA lesion is 30% stenosed.   Dist RCA lesion is 50% stenosed.   1st Diag lesion is 90% stenosed.   Prox LAD to Mid LAD lesion is 95% stenosed.   A drug-eluting stent was successfully placed using a SYNERGY XD 3.0X16.   Balloon angioplasty was performed using a BALLN SAPPHIRE 2.0X12.   Post intervention, there is a 0% residual stenosis.   Post intervention, there is a 30% residual stenosis.   Severe proximal LAD stenosis involving  a moderate caliber diagonal branch.  Successful PTCA/DES x 1 proximal LAD Successful balloon angioplasty Diagonal 1 No obstructive disease in the Circumflex artery Moderate non-obstructive disease in the dominant RCA Elevated right and left heart pressures. (Wedge 27)      Onset around middle July 2024 bad cough rxvJuly 19 zpak then prednisone     05/16/2023  f/u ov/[name] re: bad cough since mid July 2024 assoc  recurrent chest tightness maint on symbicort  160 Take 2 puffs first thing in am and then another 2 puffs about 12 hours later.  Chief Complaint  Patient presents with   Follow-up    C/o some chest tightness.   Dyspnea: chest tightness x weeks at rest, less noticeable with walking and ? Some better p neb  - does not correlate with nausea which started p doxy rx  Cough: dry cough Drury Geralds her up most nights ? Using mucinex  dm/ flutter valve as rec/ not using flutter   Sleeping: flat bed 2 pillows  SABA use: not using hfa at all  / neb 2-3 x daily  02: none  Rec Plan A = Automatic = Always=    Symbicort   160 Take 2 puffs first thing in am and then another 2 puffs about 12 hours later.  Work on inhaler technique:  Plan B = Backup (to supplement plan A, not to replace it) Only use your albuterol  inhaler as a rescue medication Plan C = Crisis (instead of Plan B but only if Plan B stops working) - only use your albuterol  nebulizer if you first try Plan B  Continue pantoprazole  40 mg Take 30- 60 min before your first and last meals of the day and pepcid  20 mg one at bedtime  GERD diet reviewed, bed blocks   For cough/ congestion > mucinex  or mucinex  dm  up to maximum of  1200 mg every 12 hours and use the flutter valve as much as you can   Depomedrol 120 mg IM    07/06/2023  f/u ov/[name] re: asthma maint on symbicort  160 did not  bring meds  Chief Complaint  Patient presents with   Follow-up    Chest feels tight today  Dyspnea:  75 ft  Cough: dry  sporadic worse with walking  Sleeping: flat bed 2 -3 pillows s resp cc  SABA use: last used hfa > 12 h prior to OV  / neb not using either 02: none   Rec Depomedrol 120 mg IM today  For cough > phenergan  dm up to 1 -2 tsp every  4 hours as meds   Please keep your follow up appt and  call sooner if needed with all medications /inhalers/ solutions in hand     08/01/2023  f/u ov/[name] re: asthma with elevated edp and esophagitis on CT chest 04/2023    maint on symbicort  160 x 2 puffs did  bring meds Chief Complaint  Patient presents with   Follow-up    Some improvement with cough, but still present.  Itchy throat persistent.  Dyspnea:  fatigue stops her 1st  Cough: tickle  Sleeping: flat bed 2 pillows s  resp cc  SABA use: once or twice daily  02: none  Rec     02/14/2024  f/u ov/[name] re: ***   maint on ***  No chief complaint on file.   Dyspnea:  *** Cough: *** Sleeping: *** resp cc  SABA use: *** 02: ***  Lung cancer screening :  ***    No  obvious day to day or daytime variability or assoc excess/ purulent sputum or mucus plugs  or hemoptysis or cp or chest tightness, subjective wheeze or overt sinus or hb symptoms.    Also denies any obvious fluctuation of symptoms with weather or environmental changes or other aggravating or alleviating factors except as outlined above   No unusual exposure hx or h/o childhood pna/ asthma or knowledge of premature birth.  Current Allergies, Complete Past Medical History, Past Surgical History, Family History, and Social History were reviewed in [employer] record.  ROS  The following are not active complaints unless bolded Hoarseness, sore throat, dysphagia, dental problems, itching, sneezing,  nasal congestion or discharge of excess mucus or purulent secretions, ear ache,   fever, chills, sweats, unintended wt loss or wt gain, classically pleuritic or exertional cp,  orthopnea pnd or arm/hand swelling  or leg swelling, presyncope, palpitations, abdominal pain, anorexia, nausea, vomiting, diarrhea  or change in bowel habits or change in bladder habits, change in stools or change in urine, dysuria, hematuria,  rash, arthralgias, visual complaints, headache, numbness, weakness or ataxia or problems with walking or coordination,  change in mood or  memory.        No outpatient medications have been marked as taking for the 02/14/24 encounter (Appointment) with Clarissa Laird B, MD.   Current Facility-Administered Medications for the 02/14/24 encounter (Appointment) with Diamond Formica, MD  Medication   [START ON 04/05/2024] denosumab  (PROLIA ) injection 60 mg              Past Medical History:   OSTEOPOROSIS (ICD-733.00)  HYPERTENSION (ICD-401.9)  HYPERLIPIDEMIA (ICD-272.4)  DIVERTICULITIS, HX OF (ICD-V12.79)  DIABETES MELLITUS, TYPE II (ICD-250.00)  COLON CANCER, HX OF (ICD-V10.05)  ASTHMA (ICD-493.90)  -PFT's 03/18/08 minimal airflow obstruction  -PFT's January 04, 2010 No sign airflow obstruction  -Mastered HFA technique August 13, 2008 > confimed November 23, 2009  ANXIETY (ICD-300.00)  ANEMIA-IRON DEFICIENCY (ICD-280.9)  ALLERGIC RHINITIS (ICD-477.9)  IBS  GERD            Objective:   Physical Exam  Wts   02/14/2024     ***  08/01/2023   173  07/06/2023    168   05/16/2023    168  07/31/2022  173  01/25/2022    167   07/04/2021    182 11/04/2020      181 05/04/2020      185 05/05/2019      196  wt 202 November 23, 2009> 199 January 04, 2010 > 209  03/22/11 > 214 10/16/2013 >  01/25/2017   193> 01/28/2018   200    Vital signs reviewed  02/14/2024  - Note at rest 02 sats  ***% on ***   General appearance:    ***     CXR PA and Lateral:   08/01/2023 :    I personally reviewed images and impression is as follows:      Cardiomegaly s overt chf   Labs ordered/ reviewed:      Chemistry        Lab Results  Component Value Date   CREATININE 2.39 (H) 08/01/2023   CREATININE 2.82 (H) 05/16/2023   CREATININE 2.74 (H) 05/01/2023                      Assessment:

## 2024-02-14 ENCOUNTER — Ambulatory Visit: Admitting: Internal Medicine

## 2024-02-14 ENCOUNTER — Encounter: Payer: Self-pay | Admitting: Internal Medicine

## 2024-02-14 VITALS — BP 122/64 | HR 71 | Ht 66.0 in | Wt 167.4 lb

## 2024-02-14 DIAGNOSIS — J45991 Cough variant asthma: Secondary | ICD-10-CM | POA: Diagnosis not present

## 2024-02-14 MED ORDER — MONTELUKAST SODIUM 10 MG PO TABS: 10.0000 mg | ORAL_TABLET | Freq: Every day | ORAL | 3 refills | Status: AC

## 2024-02-14 MED ORDER — METHYLPREDNISOLONE ACETATE 80 MG/ML IJ SUSP
120.0000 mg | Freq: Once | INTRAMUSCULAR | Status: AC
  Administered 2024-02-14: 120 mg via INTRAMUSCULAR

## 2024-02-14 NOTE — Addendum Note (Signed)
 Addended byGregory Leash, Donasia Wimes A on: 02/14/2024 09:23 AM   Modules accepted: Orders

## 2024-02-14 NOTE — Assessment & Plan Note (Signed)
 PFT's 03/18/08 minimal airflow obstruction  -PFT's January 04, 2010 No sign airflow obstruction   - FENO 01/25/2017  =   23 p am symb 160 x 2 pffs - Spirometry 01/25/2017  wnl x for min curvature on f/v p am symb 160 x 2 / no saba prior - 07/30/2017   After extensive coaching HFA effectiveness =    75% from baseline 50%  - Allergy  profile 07/30/17  >  Eos 0.2 /  IgE  145  RAST pos mold  - FENO 01/28/2018  =   17 - Spirometry 01/28/2018  FEV1 1.22 (65%)  Ratio 79  - Allergy  screen 05/16/23 >  Eos 0.1/  IgE  919> referred to allergy  >seen 06/05/23 pos ragweed/ roach rec consider allergy  shots    - FENO  08/01/2023 = 20 on symb 160/singulair   - 02/14/2024  After extensive coaching inhaler device,  effectiveness =    80% (short ti) - 02/14/2024 referred back to allergy  for throat itches/ eyes run   Mild flare of rhinitis despite zyrtec  so rec:  1) try  1st gen H1 blockers per guidelines  if can tol drowsiness as main side effect/ advised 2) depomedrol 120 mg IM  3) Allergy  f/u as planned  Her asthma is doing well and hfa good technique so f/u one year - beware cardiac asthma risk is a concern     Each maintenance medication was reviewed in detail including emphasizing most importantly the difference between maintenance and prns and under what circumstances the prns are to be triggered using an action plan format where appropriate.  Total time for H and P, chart review, counseling, reviewing hfa device(s) and generating customized AVS unique to this office visit / same day charting = 30 min

## 2024-02-14 NOTE — Patient Instructions (Signed)
 For drainage / throat tickle not responding to zyrtec  >  change to  CHLORPHENIRAMINE  4 mg  ("Allergy  Relief" 4mg   at [employer] should be easiest to find in the blue box usually on bottom shelf)  take one every 4 hours as needed - extremely effective and inexpensive over the counter- may cause drowsiness so start with just a dose or two an hour before bedtime and see how you tolerate it before trying in daytime.   Depomedrol 120 mg IM   Please schedule a follow up visit in 12  months but call sooner if needed

## 2024-02-18 ENCOUNTER — Ambulatory Visit: Admitting: Obstetrics and Gynecology

## 2024-02-18 ENCOUNTER — Encounter: Payer: Self-pay | Admitting: Obstetrics and Gynecology

## 2024-02-18 VITALS — BP 142/78 | HR 79 | Wt 165.4 lb

## 2024-02-18 DIAGNOSIS — N9089 Other specified noninflammatory disorders of vulva and perineum: Secondary | ICD-10-CM | POA: Diagnosis not present

## 2024-02-18 NOTE — Progress Notes (Signed)
 88 y.o. postmenopausal female s/p TVH here for vulvar lesion. Widowed.  No LMP recorded. Patient has had a hysterectomy.   Pt here with daughter and states that pt had an imaging done and it was seen that she had a labial iesion. Denies any vulvar pain, PMB.  11/29/23 CT ab/pelvis (LLQ pain): Reproductive: Status post hysterectomy. No adnexal masses. Left subcutaneous soft tissue density 2 cm lesion in the vulva, likely a cyst which is only partially imaged. This could be assessed further with ultrasound.  01/01/24 TVUS: FINDINGS: Ultrasound demonstrates a hypoechoic mass measuring 2.6 x 1.1 x 1.3 cm consistent with a solid mass in the left labia majora. Findings correlate with the prior CT findings   IMPRESSION: Solid soft tissue mass as described. Differential diagnosis may be that of a benign labial neoplastic lesion, including uncommon entities such as an angio fibroblastoma. Differential diagnosis could include a leiomyoma versus other mesenchymal tumors. And inspissated Bartholin cyst, or hematoma versus a nerve sheath tumor.  GYN HISTORY: No significant history  OB History  No obstetric history on file.    Past Medical History:  Diagnosis Date   Allergic rhinitis    Anterior chest wall pain    Anxiety    Asthma    Chronic diastolic CHF (congestive heart failure) (HCC)    Echo 01/2020: EF 60-65, normal wall motion, mild LVH, normal RV SF, RVSP 52.6 (moderate elevation), severe LAE, moderate RAE, trivial MR, mild MS (mean gradient 5.5 mmHg), mild aortic valve sclerosis (no AS); elevated E/e' c/w elevated LVEDP   Cough    DM type 2 (diabetes mellitus, type 2) (HCC)    GERD (gastroesophageal reflux disease)    History of diverticulitis of colon    HTN (hypertension)    Hyperlipidemia    IBS (irritable bowel syndrome)    Iron deficiency anemia    Mitral stenosis 04/17/2022   Echo 04/2022: EF 55-60, no RWMA, mild LVH, normal RVSF, normal PASP, moderate LAE, moderate mitral  stenosis (mean gradient 4 mmHg), trivial MR, trivial AI, AV sclerosis without stenosis   Osteoporosis, unspecified    Renal insufficiency 07/31/2017   UTI (urinary tract infection)     Past Surgical History:  Procedure Laterality Date   CARDIOVASCULAR STRESS TEST  02/25/04   CORONARY BALLOON ANGIOPLASTY N/A 12/28/2021   Procedure: CORONARY BALLOON ANGIOPLASTY;  Surgeon: Odie Benne, MD;  Location: [facility];  Service: Cardiovascular;  Laterality: N/A;   CORONARY STENT INTERVENTION N/A 12/28/2021   Procedure: CORONARY STENT INTERVENTION;  Surgeon: Odie Benne, MD;  Location: [facility];  Service: Cardiovascular;  Laterality: N/A;   ESOPHAGOGASTRODUODENOSCOPY  12/26/01   PACEMAKER IMPLANT N/A 08/02/2020   Procedure: PACEMAKER IMPLANT;  Surgeon: Verona Goodwill, MD;  Location: [facility];  Service: Cardiovascular;  Laterality: N/A;   RIGHT OOPHORECTOMY  jan 2010   RIGHT/LEFT HEART CATH AND CORONARY ANGIOGRAPHY N/A 12/28/2021   Procedure: RIGHT/LEFT HEART CATH AND CORONARY ANGIOGRAPHY;  Surgeon: Odie Benne, MD;  Location: [facility];  Service: Cardiovascular;  Laterality: N/A;    Current Outpatient Medications on File Prior to Visit  Medication Sig Dispense Refill   acetaminophen  (TYLENOL ) 325 MG tablet Take 650 mg by mouth every 6 (six) hours as needed for pain.     albuterol  (PROAIR  HFA) 108 (90 Base) MCG/ACT inhaler 2 puffs every 4 hours as needed only  if your can't catch your breath 18 g 11   albuterol  (PROVENTIL ) (2.5  MG/3ML) 0.083% nebulizer solution INHALE 3 ML BY NEBULIZATION EVERY 6 HOURS AS NEEDED FOR WHEEZING OR SHORTNESS OF BREATH 180 mL 1   apixaban  (ELIQUIS ) 2.5 MG TABS tablet TAKE 1 TABLET BY MOUTH TWICE A DAY 60 tablet 6   Ascorbic Acid (VITAMIN C) 1000 MG tablet Take 1,000 mg by mouth daily.     atorvastatin  (LIPITOR ) 80 MG tablet TAKE 1 TABLET BY MOUTH EVERY DAY 90 tablet 3   Azelastine  HCl 137 MCG/SPRAY SOLN  PLACE 2 SPRAYS INTO BOTH NOSTRILS 2 (TWO) TIMES DAILY AS NEEDED FOR ALLERGIES. USE IN EACH NOSTRIL AS DIRECTED 90 mL 0   BD VEO INSULIN  SYRINGE U/F 31G X 15/64" 0.5 ML MISC USE AS INSTRUCTED 100 each 3   Blood Glucose Monitoring Suppl (ONETOUCH VERIO FLEX SYSTEM) w/Device KIT USE AS DIRECTED 1 kit .   budesonide -formoterol  (SYMBICORT ) 160-4.5 MCG/ACT inhaler TAKE 2 PUFFS FIRST THING IN MORNING AND THEN ANOTHER 2 PUFFS ABOUT 12 HOURS LATER. 30.6 each 6   carvedilol  (COREG ) 3.125 MG tablet TAKE 1 TABLET BY MOUTH TWICE A DAY WITH FOOD 180 tablet 2   cetirizine  (ZYRTEC  ALLERGY ) 10 MG tablet Take 1 tablet (10 mg total) by mouth daily. 30 tablet 5   cholecalciferol (VITAMIN D3) 25 MCG (1000 UNIT) tablet Take 1,000 Units by mouth daily. Isn't taking regularly     clopidogrel  (PLAVIX ) 75 MG tablet Take 1 tablet (75 mg total) by mouth daily with breakfast. 90 tablet 3   denosumab  (PROLIA ) 60 MG/ML SOSY injection Inject 60 mg into the skin every 6 (six) months. Dx code: M81.0.  Pt has appointment on 11/07/23 1 mL 0   diclofenac  Sodium (VOLTAREN ) 1 % GEL Apply 4 g topically 4 (four) times daily as needed (pain). 150 g 0   famotidine  (PEPCID ) 20 MG tablet Take 1 tablet (20 mg total) by mouth 2 (two) times daily. 180 tablet 2   FARXIGA  5 MG TABS tablet TAKE 1 TABLET BY MOUTH EVERY DAY BEFORE BREAKFAST 30 tablet 3   fluticasone  (FLONASE ) 50 MCG/ACT nasal spray Place 2 sprays into both nostrils daily. 16 g 5   furosemide  (LASIX ) 40 MG tablet TAKE 1 TABLET BY MOUTH TWICE A DAY 180 tablet 3   glucose blood (ONETOUCH VERIO) test strip 1 each by Other route 2 (two) times daily. And lancets 2/day 200 each 3   glucose blood test strip Check blood sugar twice a day 100 each 12   hydrALAZINE  (APRESOLINE ) 25 MG tablet Take 3 tablets (75 mg total) by mouth 3 (three) times daily. 810 tablet 3   insulin  lispro (HUMALOG ) 100 UNIT/ML injection Give 3 units with BREAKFAST, AND 18 units with SUPPER 60 mL 1   latanoprost  (XALATAN )  0.005 % ophthalmic solution Place 1 drop into both eyes at bedtime.     methylPREDNISolone  (MEDROL  DOSEPAK) 4 MG TBPK tablet Take with signs of chronic sinusitis and take as directed 1 each 1   montelukast  (SINGULAIR ) 10 MG tablet Take 1 tablet (10 mg total) by mouth at bedtime. 90 tablet 3   nitroGLYCERIN  (NITROSTAT ) 0.4 MG SL tablet Place 1 tablet (0.4 mg total) under the tongue every 5 (five) minutes x 3 doses as needed for chest pain. 25 tablet 2   ondansetron  (ZOFRAN ) 8 MG tablet TAKE 1 TABLET BY MOUTH EVERY 8 HOURS AS NEEDED FOR NAUSEA OR VOMITING. 18 tablet 1   pantoprazole  (PROTONIX ) 40 MG tablet Take 1 tablet (40 mg total) by mouth daily. 180 tablet 3  potassium chloride  (KLOR-CON ) 10 MEQ tablet TAKE 1 TABLET BY MOUTH EVERY DAY 90 tablet 2   promethazine -dextromethorphan (PROMETHAZINE -DM) 6.25-15 MG/5ML syrup Take 5 mLs by mouth 4 (four) times daily as needed for cough. 240 mL 0   sodium bicarbonate 650 MG tablet Take 650 mg by mouth 2 (two) times daily.     Syringe/Needle, Disp, (SYRINGE 3CC/25GX1") 25G X 1" 3 ML MISC Use to inject into the skin 2x a day 100 each 3   tiZANidine  (ZANAFLEX ) 2 MG tablet Take 1 tablet (2 mg total) by mouth every 8 (eight) hours as needed for muscle spasms. 30 tablet 0   traZODone  (DESYREL ) 50 MG tablet Take 0.5 tablets (25 mg total) by mouth at bedtime as needed for sleep. 15 tablet 0   vitamin B-12 (CYANOCOBALAMIN ) 100 MCG tablet Take 100 mcg by mouth daily.     Current Facility-Administered Medications on File Prior to Visit  Medication Dose Route Frequency Provider Last Rate Last Admin   [START ON 04/05/2024] denosumab  (PROLIA ) injection 60 mg  60 mg Subcutaneous Once Adra Alanis, FNP        Allergies  Allergen Reactions   Aspirin  Shortness Of Breath and Other (See Comments)    Caused asthma symptoms   Ramipril Cough   Doxycycline      Nausea      PE Today's Vitals   02/18/24 1514  BP: (!) 142/78  Pulse: 79  SpO2: 99%  Weight: 165  lb 6.4 oz (75 kg)   Body mass index is 26.7 kg/m.  Physical Exam Vitals reviewed. Exam conducted with a chaperone present.  Constitutional:      General: She is not in acute distress.    Appearance: Normal appearance.  HENT:     Head: Normocephalic and atraumatic.     Nose: Nose normal.  Eyes:     Extraocular Movements: Extraocular movements intact.     Conjunctiva/sclera: Conjunctivae normal.  Pulmonary:     Effort: Pulmonary effort is normal.  Genitourinary:    Exam position: Lithotomy position.     Labia:        Right: No rash, tenderness, lesion or injury.        Left: No rash, tenderness or injury.        Comments: Left subcutaneous vulvar mass with normal overlying skin, nontender, ~2 (L)x1.5 (w)cm Musculoskeletal:        General: Normal range of motion.     Cervical back: Normal range of motion.  Lymphadenopathy:     Lower Body: No right inguinal adenopathy. No left inguinal adenopathy.  Neurological:     General: No focal deficit present.     Mental Status: She is alert.  Psychiatric:        Mood and Affect: Mood normal.        Behavior: Behavior normal.       Assessment and Plan:        Vulvar mass  Patient with incidental left vulvar mass found on CT abdomen pelvis during evaluation for chronic left lower quadrant pain in February 2025. Follow-up TVUS revealed 2 cm vulvar mass with benign characteristics. Exam today is also consistent with a benign, left upper vulvar lesion, most likely a inclusion cyst Discussed this is likely a chronic mass, patient able to palpate today on exam but was unaware of it prior to today. Recommend follow-up in 6 months to ensure stability. Patient and daughter are in agreement.  Romaine Closs, MD

## 2024-02-19 ENCOUNTER — Encounter (HOSPITAL_BASED_OUTPATIENT_CLINIC_OR_DEPARTMENT_OTHER): Payer: Self-pay | Admitting: Emergency Medicine

## 2024-02-19 ENCOUNTER — Emergency Department (HOSPITAL_BASED_OUTPATIENT_CLINIC_OR_DEPARTMENT_OTHER)
Admission: EM | Admit: 2024-02-19 | Discharge: 2024-02-19 | Disposition: A | Discharge status: Home / Self Care | Attending: Emergency Medicine | Admitting: Emergency Medicine

## 2024-02-19 ENCOUNTER — Emergency Department (HOSPITAL_BASED_OUTPATIENT_CLINIC_OR_DEPARTMENT_OTHER): Admitting: Radiology

## 2024-02-19 ENCOUNTER — Other Ambulatory Visit: Payer: Self-pay

## 2024-02-19 ENCOUNTER — Emergency Department (HOSPITAL_BASED_OUTPATIENT_CLINIC_OR_DEPARTMENT_OTHER)

## 2024-02-19 DIAGNOSIS — M19012 Primary osteoarthritis, left shoulder: Secondary | ICD-10-CM | POA: Diagnosis not present

## 2024-02-19 DIAGNOSIS — I2510 Atherosclerotic heart disease of native coronary artery without angina pectoris: Secondary | ICD-10-CM | POA: Diagnosis not present

## 2024-02-19 DIAGNOSIS — Z7901 Long term (current) use of anticoagulants: Secondary | ICD-10-CM | POA: Insufficient documentation

## 2024-02-19 DIAGNOSIS — I129 Hypertensive chronic kidney disease with stage 1 through stage 4 chronic kidney disease, or unspecified chronic kidney disease: Secondary | ICD-10-CM | POA: Diagnosis not present

## 2024-02-19 DIAGNOSIS — Z794 Long term (current) use of insulin: Secondary | ICD-10-CM | POA: Diagnosis not present

## 2024-02-19 DIAGNOSIS — E1122 Type 2 diabetes mellitus with diabetic chronic kidney disease: Secondary | ICD-10-CM | POA: Diagnosis not present

## 2024-02-19 DIAGNOSIS — M25512 Pain in left shoulder: Secondary | ICD-10-CM | POA: Insufficient documentation

## 2024-02-19 DIAGNOSIS — M79602 Pain in left arm: Secondary | ICD-10-CM | POA: Diagnosis not present

## 2024-02-19 DIAGNOSIS — M542 Cervicalgia: Secondary | ICD-10-CM | POA: Diagnosis not present

## 2024-02-19 DIAGNOSIS — M5030 Other cervical disc degeneration, unspecified cervical region: Secondary | ICD-10-CM | POA: Diagnosis not present

## 2024-02-19 DIAGNOSIS — M4802 Spinal stenosis, cervical region: Secondary | ICD-10-CM | POA: Diagnosis not present

## 2024-02-19 DIAGNOSIS — N189 Chronic kidney disease, unspecified: Secondary | ICD-10-CM | POA: Insufficient documentation

## 2024-02-19 DIAGNOSIS — I10 Essential (primary) hypertension: Secondary | ICD-10-CM | POA: Diagnosis not present

## 2024-02-19 DIAGNOSIS — R29898 Other symptoms and signs involving the musculoskeletal system: Secondary | ICD-10-CM | POA: Diagnosis not present

## 2024-02-19 LAB — CBC WITH DIFFERENTIAL/PLATELET
Abs Immature Granulocytes: 0.09 10*3/uL — ABNORMAL HIGH (ref 0.00–0.07)
Basophils Absolute: 0 10*3/uL (ref 0.0–0.1)
Basophils Relative: 0 %
Eosinophils Absolute: 0 10*3/uL (ref 0.0–0.5)
Eosinophils Relative: 0 %
HCT: 37 % (ref 36.0–46.0)
Hemoglobin: 13 g/dL (ref 12.0–15.0)
Immature Granulocytes: 1 %
Lymphocytes Relative: 3 %
Lymphs Abs: 0.4 10*3/uL — ABNORMAL LOW (ref 0.7–4.0)
MCH: 27.1 pg (ref 26.0–34.0)
MCHC: 35.1 g/dL (ref 30.0–36.0)
MCV: 77.2 fL — ABNORMAL LOW (ref 80.0–100.0)
Monocytes Absolute: 0.9 10*3/uL (ref 0.1–1.0)
Monocytes Relative: 6 %
Neutro Abs: 12.4 10*3/uL — ABNORMAL HIGH (ref 1.7–7.7)
Neutrophils Relative %: 90 %
Platelets: 161 10*3/uL (ref 150–400)
RBC: 4.79 MIL/uL (ref 3.87–5.11)
RDW: 20.4 % — ABNORMAL HIGH (ref 11.5–15.5)
WBC: 13.8 10*3/uL — ABNORMAL HIGH (ref 4.0–10.5)
nRBC: 0.2 % (ref 0.0–0.2)

## 2024-02-19 LAB — BASIC METABOLIC PANEL WITH GFR
Anion gap: 17 — ABNORMAL HIGH (ref 5–15)
BUN: 38 mg/dL — ABNORMAL HIGH (ref 8–23)
CO2: 18 mmol/L — ABNORMAL LOW (ref 22–32)
Calcium: 9.6 mg/dL (ref 8.9–10.3)
Chloride: 99 mmol/L (ref 98–111)
Creatinine, Ser: 2.28 mg/dL — ABNORMAL HIGH (ref 0.44–1.00)
GFR, Estimated: 20 mL/min — ABNORMAL LOW (ref >60)
Glucose, Bld: 297 mg/dL — ABNORMAL HIGH (ref 70–99)
Potassium: 4.3 mmol/L (ref 3.5–5.1)
Sodium: 135 mmol/L (ref 135–145)

## 2024-02-19 LAB — TROPONIN T, HIGH SENSITIVITY
Troponin T High Sensitivity: 37 ng/L — ABNORMAL HIGH (ref <19)
Troponin T High Sensitivity: 37 ng/L — ABNORMAL HIGH (ref <19)

## 2024-02-19 MED ORDER — LIDOCAINE 5 % EX PTCH
1.0000 | MEDICATED_PATCH | CUTANEOUS | Status: DC
  Administered 2024-02-19: 1 via TRANSDERMAL
  Filled 2024-02-19: qty 1

## 2024-02-19 MED ORDER — LIDOCAINE 5 % EX PTCH: 1.0000 | MEDICATED_PATCH | CUTANEOUS | 0 refills | Status: AC

## 2024-02-19 MED ORDER — FENTANYL CITRATE PF 50 MCG/ML IJ SOSY
50.0000 ug | PREFILLED_SYRINGE | Freq: Once | INTRAMUSCULAR | Status: AC
  Administered 2024-02-19: 50 ug via INTRAVENOUS
  Filled 2024-02-19: qty 1

## 2024-02-19 MED ORDER — OXYCODONE HCL 5 MG PO TABS
5.0000 mg | ORAL_TABLET | Freq: Once | ORAL | Status: AC
  Administered 2024-02-19: 5 mg via ORAL
  Filled 2024-02-19: qty 1

## 2024-02-19 MED ORDER — OXYCODONE HCL 5 MG PO TABS: 5.0000 mg | ORAL_TABLET | Freq: Four times a day (QID) | ORAL | 0 refills | Status: DC | PRN

## 2024-02-19 NOTE — ED Triage Notes (Signed)
 Woke up with left arm and neck pain/ weakness, Some swelling the wrist.  Took tylenol  and muscle relaxer without relief

## 2024-02-19 NOTE — ED Provider Notes (Signed)
 [place] EMERGENCY DEPARTMENT AT [facility] Provider Note   CSN: [id number] Arrival date & time: 02/19/24  1805     History  Chief Complaint  Patient presents with   Arm Pain    [name] is a 88 y.o. female.  Patient here with left shoulder pain, left-sided neck pain but concerned about something cardiac.  Muscle relaxant today did not help.  Pain started about 13 hours ago.  Denies any trauma.  History of hypertension high cholesterol diabetes anxiety IBS, CAD.  Denies any weakness numbness tingling.  Denies any headache.  Denies any shortness of breath cough or sputum production.  The history is provided by the patient.       Home Medications Prior to Admission medications   Medication Sig Start Date End Date Taking? Authorizing Provider  lidocaine  (LIDODERM ) 5 % Place 1 patch onto the skin daily. Remove & Discard patch within 12 hours or as directed by MD 02/19/24  Yes Sarahgrace Broman, DO  oxyCODONE  (ROXICODONE ) 5 MG immediate release tablet Take 1 tablet (5 mg total) by mouth every 6 (six) hours as needed for up to 10 doses. 02/19/24  Yes Angelika Jerrett, DO  acetaminophen  (TYLENOL ) 325 MG tablet Take 650 mg by mouth every 6 (six) hours as needed for pain.    [provider]  albuterol  (PROAIR  HFA) 108 (90 Base) MCG/ACT inhaler 2 puffs every 4 hours as needed only  if your can't catch your breath 12/26/23   Diamond Formica, MD  albuterol  (PROVENTIL ) (2.5 MG/3ML) 0.083% nebulizer solution INHALE 3 ML BY NEBULIZATION EVERY 6 HOURS AS NEEDED FOR WHEEZING OR SHORTNESS OF BREATH 11/26/23   Adra Alanis, FNP  apixaban  (ELIQUIS ) 2.5 MG TABS tablet TAKE 1 TABLET BY MOUTH TWICE A DAY 01/30/24   Elmyra Haggard, MD  Ascorbic Acid (VITAMIN C) 1000 MG tablet Take 1,000 mg by mouth daily.    [provider]  atorvastatin  (LIPITOR ) 80 MG tablet TAKE 1 TABLET BY MOUTH EVERY DAY 07/27/23   Ross, Paula V, MD  Azelastine  HCl 137 MCG/SPRAY SOLN PLACE 2 SPRAYS  INTO BOTH NOSTRILS 2 (TWO) TIMES DAILY AS NEEDED FOR ALLERGIES. USE IN EACH NOSTRIL AS DIRECTED 01/21/24   Kandice Orleans, MD  BD VEO INSULIN  SYRINGE U/F 31G X 15/64" 0.5 ML MISC USE AS INSTRUCTED 04/10/23   Lajean Pike, MD  Blood Glucose Monitoring Suppl [facility] VERIO FLEX SYSTEM) w/Device KIT USE AS DIRECTED 01/16/22   Gwyndolyn Lerner, MD  budesonide -formoterol  (SYMBICORT ) 160-4.5 MCG/ACT inhaler TAKE 2 PUFFS FIRST THING IN MORNING AND THEN ANOTHER 2 PUFFS ABOUT 12 HOURS LATER. 11/25/23   Diamond Formica, MD  carvedilol  (COREG ) 3.125 MG tablet TAKE 1 TABLET BY MOUTH TWICE A DAY WITH FOOD 12/04/23   Elmyra Haggard, MD  cetirizine  (ZYRTEC  ALLERGY ) 10 MG tablet Take 1 tablet (10 mg total) by mouth daily. 09/05/23   Kandice Orleans, MD  cholecalciferol (VITAMIN D3) 25 MCG (1000 UNIT) tablet Take 1,000 Units by mouth daily. Isn't taking regularly    [provider]  clopidogrel  (PLAVIX ) 75 MG tablet Take 1 tablet (75 mg total) by mouth daily with breakfast. 12/17/23   Elmyra Haggard, MD  denosumab  (PROLIA ) 60 MG/ML SOSY injection Inject 60 mg into the skin every 6 (six) months. Dx code: M81.0.  Pt has appointment on 11/07/23 11/06/23   Adra Alanis, FNP  diclofenac  Sodium (VOLTAREN ) 1 % GEL Apply 4 g topically 4 (four) times daily as  needed (pain). 12/25/23   Adra Alanis, FNP  famotidine  (PEPCID ) 20 MG tablet Take 1 tablet (20 mg total) by mouth 2 (two) times daily. 12/03/23   Adra Alanis, FNP  FARXIGA  5 MG TABS tablet TAKE 1 TABLET BY MOUTH EVERY DAY BEFORE BREAKFAST 01/29/24   Jorge Newcomer, MD  fluticasone  (FLONASE ) 50 MCG/ACT nasal spray Place 2 sprays into both nostrils daily. 09/05/23   Kandice Orleans, MD  furosemide  (LASIX ) 40 MG tablet TAKE 1 TABLET BY MOUTH TWICE A DAY 09/19/23   Elmyra Haggard, MD  glucose blood [facility] VERIO) test strip 1 each by Other route 2 (two) times daily. And lancets 2/day 05/09/23   Jorge Newcomer, MD  glucose blood test strip Check blood sugar  twice a day 04/11/21   Gwyndolyn Lerner, MD  hydrALAZINE  (APRESOLINE ) 25 MG tablet Take 3 tablets (75 mg total) by mouth 3 (three) times daily. 07/30/23   Elmyra Haggard, MD  insulin  lispro (HUMALOG ) 100 UNIT/ML injection Give 3 units with BREAKFAST, AND 18 units with SUPPER 04/11/23   Motwani, Komal, MD  latanoprost  (XALATAN ) 0.005 % ophthalmic solution Place 1 drop into both eyes at bedtime. 03/16/21   [provider]  methylPREDNISolone  (MEDROL  DOSEPAK) 4 MG TBPK tablet Take with signs of chronic sinusitis and take as directed 12/28/23   Soldatova, Liuba, MD  montelukast  (SINGULAIR ) 10 MG tablet Take 1 tablet (10 mg total) by mouth at bedtime. 02/14/24   Diamond Formica, MD  nitroGLYCERIN  (NITROSTAT ) 0.4 MG SL tablet Place 1 tablet (0.4 mg total) under the tongue every 5 (five) minutes x 3 doses as needed for chest pain. 07/24/23   Elmyra Haggard, MD  ondansetron  (ZOFRAN ) 8 MG tablet TAKE 1 TABLET BY MOUTH EVERY 8 HOURS AS NEEDED FOR NAUSEA OR VOMITING. 01/07/24   Adra Alanis, FNP  pantoprazole  (PROTONIX ) 40 MG tablet Take 1 tablet (40 mg total) by mouth daily. 08/08/23   Daina Drum, MD  potassium chloride  (KLOR-CON ) 10 MEQ tablet TAKE 1 TABLET BY MOUTH EVERY DAY 12/04/23   Elmyra Haggard, MD  promethazine -dextromethorphan (PROMETHAZINE -DM) 6.25-15 MG/5ML syrup Take 5 mLs by mouth 4 (four) times daily as needed for cough. 12/25/23   Adra Alanis, FNP  sodium bicarbonate 650 MG tablet Take 650 mg by mouth 2 (two) times daily. 05/19/22   [provider]  Syringe/Needle, Disp, (SYRINGE 3CC/25GX1") 25G X 1" 3 ML MISC Use to inject into the skin 2x a day 04/11/21   Gwyndolyn Lerner, MD  tiZANidine  (ZANAFLEX ) 2 MG tablet Take 1 tablet (2 mg total) by mouth every 8 (eight) hours as needed for muscle spasms. 01/29/24   Adra Alanis, FNP  traZODone  (DESYREL ) 50 MG tablet Take 0.5 tablets (25 mg total) by mouth at bedtime as needed for sleep. 07/24/23   Elmyra Haggard, MD   vitamin B-12 (CYANOCOBALAMIN ) 100 MCG tablet Take 100 mcg by mouth daily.    [provider]      Allergies    Aspirin , Ramipril, and Doxycycline     Review of Systems   Review of Systems  Physical Exam Updated Vital Signs BP (!) 144/80   Pulse 64   Temp 97.8 F (36.6 C) (Oral)   Resp (!) 23   SpO2 98%  Physical Exam Vitals and nursing note reviewed.  Constitutional:      General: She is not in acute distress.    Appearance: She is well-developed. She is not ill-appearing.  HENT:  Head: Normocephalic and atraumatic.     Nose: Nose normal.     Mouth/Throat:     Mouth: Mucous membranes are moist.  Eyes:     Extraocular Movements: Extraocular movements intact.     Conjunctiva/sclera: Conjunctivae normal.     Pupils: Pupils are equal, round, and reactive to light.  Cardiovascular:     Rate and Rhythm: Normal rate and regular rhythm.     Pulses: Normal pulses.     Heart sounds: Normal heart sounds. No murmur heard. Pulmonary:     Effort: Pulmonary effort is normal. No respiratory distress.     Breath sounds: Normal breath sounds.  Abdominal:     Palpations: Abdomen is soft.     Tenderness: There is no abdominal tenderness.  Musculoskeletal:        General: No swelling.     Cervical back: Normal range of motion and neck supple. Tenderness present.  Skin:    General: Skin is warm and dry.     Capillary Refill: Capillary refill takes less than 2 seconds.  Neurological:     General: No focal deficit present.     Mental Status: She is alert and oriented to person, place, and time.     Cranial Nerves: No cranial nerve deficit.     Sensory: No sensory deficit.     Motor: No weakness.     Coordination: Coordination normal.  Psychiatric:        Mood and Affect: Mood normal.     ED Results / Procedures / Treatments   Labs (all labs ordered are listed, but only abnormal results are displayed) Labs Reviewed  CBC WITH DIFFERENTIAL/PLATELET - Abnormal; Notable  for the following components:      Result Value   WBC 13.8 (*)    MCV 77.2 (*)    RDW 20.4 (*)    Neutro Abs 12.4 (*)    Lymphs Abs 0.4 (*)    Abs Immature Granulocytes 0.09 (*)    All other components within normal limits  BASIC METABOLIC PANEL WITH GFR - Abnormal; Notable for the following components:   CO2 18 (*)    Glucose, Bld 297 (*)    BUN 38 (*)    Creatinine, Ser 2.28 (*)    GFR, Estimated 20 (*)    Anion gap 17 (*)    All other components within normal limits  TROPONIN T, HIGH SENSITIVITY - Abnormal; Notable for the following components:   Troponin T High Sensitivity 37 (*)    All other components within normal limits  TROPONIN T, HIGH SENSITIVITY - Abnormal; Notable for the following components:   Troponin T High Sensitivity 37 (*)    All other components within normal limits    EKG EKG Interpretation Date/Time:  Tuesday Feb 19 2024 19:25:10 EDT Ventricular Rate:  64 PR Interval:  137 QRS Duration:  146 QT Interval:  433 QTC Calculation: 447 R Axis:   3  Text Interpretation: Sinus rhythm Left bundle branch block Confirmed by Lowery Rue [phone]) on 02/19/2024 8:05:18 PM  Radiology CT Shoulder Left Wo Contrast Result Date: 02/19/2024 CLINICAL DATA:  Possible shoulder fracture EXAM: CT OF THE UPPER LEFT EXTREMITY WITHOUT CONTRAST TECHNIQUE: Multidetector CT imaging of the upper left extremity was performed according to the standard protocol. RADIATION DOSE REDUCTION: This exam was performed according to the departmental dose-optimization program which includes automated exposure control, adjustment of the mA and/or kV according to patient size and/or use of iterative reconstruction technique. COMPARISON:  02/19/2024 radiograph FINDINGS: Bones/Joint/Cartilage No acute fracture or malalignment. Moderate AC joint degenerative change. Moderate severe glenohumeral degenerative change with joint space narrowing and bulky inferior osteophyte. No significant shoulder  effusion. Ligaments Suboptimally assessed by CT. Muscles and Tendons No intramuscular fluid collections. No significant atrophy. Calcific tendinopathy of the rotator cuff Soft tissues Negative. Partially visualized cardiomegaly. Prominent left submandibular gland with multiple stones. IMPRESSION: 1. No CT evidence for acute osseous abnormality. 2. Moderate to severe glenohumeral degenerative change. Moderate AC joint degenerative change. 3. Calcific tendinopathy of the rotator cuff. 4. Enlarged appearing left submandibular gland with numerous stones Electronically Signed   By: Esmeralda Hedge M.D.   On: 02/19/2024 21:35   CT Cervical Spine Wo Contrast Result Date: 02/19/2024 CLINICAL DATA:  Left arm and neck pain, weakness. EXAM: CT CERVICAL SPINE WITHOUT CONTRAST TECHNIQUE: Multidetector CT imaging of the cervical spine was performed without intravenous contrast. Multiplanar CT image reconstructions were also generated. RADIATION DOSE REDUCTION: This exam was performed according to the departmental dose-optimization program which includes automated exposure control, adjustment of the mA and/or kV according to patient size and/or use of iterative reconstruction technique. COMPARISON:  08/31/2023 FINDINGS: Alignment: Normal Skull base and vertebrae: No acute fracture. No primary bone lesion or focal pathologic process. Soft tissues and spinal canal: No prevertebral fluid or swelling. No visible canal hematoma. Disc levels: Diffuse degenerative disc disease and facet disease. Multilevel bilateral neural foraminal narrowing. Upper chest: No acute findings Other: None IMPRESSION: No acute bony abnormality. Diffuse degenerative disc and facet disease. Electronically Signed   By: Janeece Mechanic M.D.   On: 02/19/2024 19:42   DG Shoulder Left Result Date: 02/19/2024 CLINICAL DATA:  [id number] Pain 144615. EXAM: LEFT SHOULDER - 2+ VIEW COMPARISON:  None Available. FINDINGS: No acute fracture or dislocation. No aggressive  osseous lesion. Apparent subtle buckling of the cortex of the acromion process of scapula is likely artifactual due to overlapping/projection. However, correlate with point tenderness to determine the need for additional imaging with CT scan. Glenohumeral and acromioclavicular joints are normal in alignment and exhibit mild-to-moderate degenerative changes. No soft tissue swelling. No radiopaque foreign bodies. There is an ICD battery pack overlying the left chest. IMPRESSION: *Apparent subtle buckling of the cortex of the acromion process of scapula is likely artifactual due to overlapping/projection. However, correlate with point tenderness to determine the need for additional imaging with CT scan. *Otherwise, no acute osseous abnormality of the left shoulder. Electronically Signed   By: Beula Brunswick M.D.   On: 02/19/2024 19:00    Procedures Procedures    Medications Ordered in ED Medications  lidocaine  (LIDODERM ) 5 % 1 patch (1 patch Transdermal Patch Applied 02/19/24 1929)  oxyCODONE  (Oxy IR/ROXICODONE ) immediate release tablet 5 mg (has no administration in time range)  fentaNYL  (SUBLIMAZE ) injection 50 mcg (50 mcg Intravenous Given 02/19/24 1928)    ED Course/ Medical Decision Making/ A&P                                 Medical Decision Making Amount and/or Complexity of Data Reviewed Labs: ordered. Radiology: ordered.  Risk Prescription drug management.   [name] is here with left shoulder pain neck pain.  History of CKD, hypertension, high cholesterol, diabetes, IBS.  Pain for the last few hours.  Mostly in the left shoulder left side of the neck.  Took a Tylenol  and muscle relaxant without much help.  Denies any weakness  numbness tingling.  Denies any fall.  Denies any chest pain shortness of breath.  Differential diagnosis likely musculoskeletal process but given her history we will check troponin EKG CBC BMP CT neck shoulder x-ray.  Will give IV fentanyl  and  reevaluate.  EKG shows sinus rhythm.  No ischemic changes.  Troponin 37 x 2.  This appears to be her baseline.  She does have chronic CKD which is likely why troponins elevated as well.  But there is no significant change in the troponin and doubt ACS.  Pain seems to be reproducible.  She had an x-ray of the left shoulder that showed may be some sort of fracture rehab CT of the shoulder that showed no evidence of fracture but does show a fair amount of arthritis.  CT of the neck was unremarkable.  But also some degenerative changes there as well.  Overall I do think the pain is likely from degenerative changes in her shoulder or neck.  Lab work was otherwise unremarkable.  Will place her in a sling for comfort prescribe her oxycodone  for breakthrough pain and have her follow-up with orthopedics and primary care.  She had good strength and sensation throughout.  I do not think there is any cord compromise or other emergent process at this time.  Discharge.  This chart was dictated using voice recognition software.  Despite best efforts to proofread,  errors can occur which can change the documentation meaning.         Final Clinical Impression(s) / ED Diagnoses Final diagnoses:  Acute pain of left shoulder  Neck pain    Rx / DC Orders ED Discharge Orders          Ordered    oxyCODONE  (ROXICODONE ) 5 MG immediate release tablet  Every 6 hours PRN        02/19/24 2224    lidocaine  (LIDODERM ) 5 %  Every 24 hours        02/19/24 2224              Lowery Rue, DO 02/19/24 2227

## 2024-02-19 NOTE — Discharge Instructions (Signed)
 Use sling for comfort.  I have prescribed your oxycodone  for breakthrough pain.  This is a narcotic pain medicine so please be careful with its use.  Do not mix alcohol drugs or dangerous activities including driving.  Follow-up with your orthopedics and primary care doctor.  Return if symptoms worsen.

## 2024-02-21 ENCOUNTER — Other Ambulatory Visit: Payer: Self-pay | Admitting: Family

## 2024-02-21 ENCOUNTER — Telehealth: Payer: Self-pay | Admitting: Family

## 2024-02-21 NOTE — Telephone Encounter (Signed)
 Please let the daughter know that I don't need to see her for ER follow up on 5/30 about her neck and shoulder since she is seeing orthopedist on 5/28 about this issue. The orthopedist is who she would need to see to discuss treatment. Always happy to see her but her daughter has indicated they appreciate when we let them know if an appointment is not required.

## 2024-02-21 NOTE — Telephone Encounter (Signed)
 Requesting:Tramadol  50 mg Contract: N/A UDS: N/A Last Visit: 01/29/2024 Next Visit: 02/29/2024 Last Refill: Do not see Rx on med sheet   Please Advise

## 2024-02-22 NOTE — Telephone Encounter (Signed)
 Spoke with pts daughter, pt would like to keep appointment with PCP. Pts daughter stated the upcoming appointment on 02/27/2024 with ortho is for pts knee. Pt does not have a follow up for neck and shoulder until 03/10/2024.

## 2024-02-27 ENCOUNTER — Ambulatory Visit: Attending: Emergency Medicine | Admitting: Emergency Medicine

## 2024-02-27 ENCOUNTER — Encounter: Payer: Self-pay | Admitting: Emergency Medicine

## 2024-02-27 VITALS — BP 110/72 | HR 74 | Ht 66.0 in | Wt 165.0 lb

## 2024-02-27 DIAGNOSIS — I5032 Chronic diastolic (congestive) heart failure: Secondary | ICD-10-CM | POA: Diagnosis not present

## 2024-02-27 DIAGNOSIS — I4891 Unspecified atrial fibrillation: Secondary | ICD-10-CM

## 2024-02-27 DIAGNOSIS — E785 Hyperlipidemia, unspecified: Secondary | ICD-10-CM | POA: Diagnosis not present

## 2024-02-27 DIAGNOSIS — I2510 Atherosclerotic heart disease of native coronary artery without angina pectoris: Secondary | ICD-10-CM | POA: Diagnosis not present

## 2024-02-27 DIAGNOSIS — I10 Essential (primary) hypertension: Secondary | ICD-10-CM

## 2024-02-27 DIAGNOSIS — M1711 Unilateral primary osteoarthritis, right knee: Secondary | ICD-10-CM | POA: Diagnosis not present

## 2024-02-27 DIAGNOSIS — M25512 Pain in left shoulder: Secondary | ICD-10-CM | POA: Diagnosis not present

## 2024-02-27 DIAGNOSIS — I342 Nonrheumatic mitral (valve) stenosis: Secondary | ICD-10-CM

## 2024-02-27 NOTE — Progress Notes (Signed)
 Cardiology Office Note:    Date:  02/27/2024  ID:  [name], DOB 08/17/1936, MRN [id number] PCP: Adra Alanis, FNP  [facility] Providers Cardiologist:  Ola Berger, MD       Patient Profile:       Chief Complaint: ED follow-up for left arm/shoulder and neck pain History of Present Illness:  [name] is a 88 y.o. female with visit-pertinent history of hypertension, type 2 diabetes, coronary artery disease, permanent atrial fibrillation, CHB s/p PPM, moderate mitral stenosis, HFpEF, hyperlipidemia, CKD  She was hospitalized in 2021 with dyspnea and found to be in complete heart block.  Now s/p PPM followed by Dr. Rodolfo Clan.  She was admitted 12/28/2021 with NSTEMI.  Underwent PTCA/DES to the LAD.  LVEF was 30 to 35% in hospital course was complicated by AKI.  Creatinine was 4.28 at discharge followed by renal.  Echocardiogram 04/2023 showed LVEF 55 to 60%, no RWMA, mild LVH, RV function and size normal, normal PASP, biatrial size severely dilated, moderate mitral stenosis, moderate MAC.  She was last seen in clinic on 12/14/2023 by Dr. Avanell Bob.  She was doing well at the time.  She did note some tingling in her left arm.  This was thought to be angina.  No changes were made to her medication regimen.  Patient was recently seen in the ED on 02/19/2024.  She presented with left shoulder pain and left-sided neck pain.  EKG showed sinus rhythm with no ischemic changes.  Troponin was 37 x 2 which appeared to be her baseline.  CT of the shoulder showed no evidence of fracture but did show a fair amount of arthritis.  CTA neck was unremarkable.  She was placed in a sling for comfort and prescribed oxycodone  for breakthrough pain.   Discussed the use of AI scribe software for clinical note transcription with the patient, who gave verbal consent to proceed.  History of Present Illness [name] is an 88 year old female with coronary artery disease presents to the office after ED visit  for left shoulder and neck pain.  Today she is without any acute cardiovascular concerns or complaints.  She notes that she is doing well overall.  She reports she never did have any chest pain or other exertional symptoms prior to her ED visit.  She denies any dyspnea, orthopnea, PND.  She still does have some intermittent left shoulder and neck pain with pain that radiates down her left arm.  She will follow-up with her PCP regarding this at the end of the week.   Review of systems:  Please see the history of present illness. All other systems are reviewed and otherwise negative.      Studies Reviewed:    EKG Interpretation Date/Time:  Wednesday Feb 27 2024 11:38:45 EDT Ventricular Rate:  74 PR Interval:    QRS Duration:  116 QT Interval:  430 QTC Calculation: 477 R Axis:   -28  Text Interpretation: Ventricular-paced rhythm When compared with ECG of 19-Feb-2024 19:25, PREVIOUS ECG IS PRESENT Confirmed by Palmer Bobo [phone]) on 02/27/2024 11:58:41 AM    Echocardiogram 04/09/2023  1. Left ventricular ejection fraction, by estimation, is 55 to 60%. The  left ventricle has normal function. The left ventricle has no regional  wall motion abnormalities. There is mild concentric left ventricular  hypertrophy. Left ventricular diastolic  function could not be evaluated.   2. Right ventricular systolic function is normal. The right ventricular  size is normal.  There is normal pulmonary artery systolic pressure.   3. Left atrial size was severely dilated.   4. Right atrial size was severely dilated.   5. Mitral valve mean gradient 5 mmHg compared with 4 mmHg 04/2022. The  mitral valve is degenerative. No evidence of mitral valve regurgitation.  Moderate mitral stenosis. Moderate mitral annular calcification.   6. The aortic valve is tricuspid. There is mild calcification of the  aortic valve. There is mild thickening of the aortic valve. Aortic valve  regurgitation is not visualized.  No aortic stenosis is present.   7. The inferior vena cava is normal in size with greater than 50%  respiratory variability, suggesting right atrial pressure of 3 mmHg.   R/L heart catheterization 12/28/2021   Mid RCA lesion is 30% stenosed.   Dist RCA lesion is 50% stenosed.   1st Diag lesion is 90% stenosed.   Prox LAD to Mid LAD lesion is 95% stenosed.   A drug-eluting stent was successfully placed using a SYNERGY XD 3.0X16.   Balloon angioplasty was performed using a BALLN SAPPHIRE 2.0X12.   Post intervention, there is a 0% residual stenosis.   Post intervention, there is a 30% residual stenosis.   Severe proximal LAD stenosis involving a moderate caliber diagonal branch.  Successful PTCA/DES x 1 proximal LAD Successful balloon angioplasty Diagonal 1 No obstructive disease in the Circumflex artery Moderate non-obstructive disease in the dominant RCA Elevated right and left heart pressures.  Diagnostic Dominance: Right  Intervention   Risk Assessment/Calculations:              Physical Exam:   VS:  BP 110/72 (BP Location: Right Arm, Patient Position: Sitting, Cuff Size: Normal)   Pulse 74   Ht 5\' 6"  (1.676 m)   Wt 165 lb (74.8 kg)   BMI 26.63 kg/m    Wt Readings from Last 3 Encounters:  02/27/24 165 lb (74.8 kg)  02/18/24 165 lb 6.4 oz (75 kg)  02/14/24 167 lb 6.4 oz (75.9 kg)    GEN: Well nourished, well developed in no acute distress NECK: No JVD; No carotid bruits CARDIAC: RRR, no murmurs, rubs, gallops RESPIRATORY:  Clear to auscultation without rales, wheezing or rhonchi  ABDOMEN: Soft, non-tender, non-distended EXTREMITIES:  No edema; No acute deformity      Assessment and Plan:  Coronary artery disease S/p NSTEMI on 11/2021 with PTCA/DES x 1 to proximal LAD and balloon angioplasty to D1 - EKG today without ischemic changes - Today patient is without any anginal symptoms, no indication for further ischemic evaluation at this time - Her left shoulder/arm  and neck pain that had brought her into the ED was likely noncardiac in nature given reassuring hs troponins and nonischemic EKG.  The CT scan of her neck showed diffuse degenerative disc and facet disease which is likely causing the radiation of pain down her left arm.  She follows up with her PCP later this week for further management of her cervical radiculopathy - Continue clopidogrel  75 mg daily, atorvastatin  80 mg daily, carvedilol  3.125 mg twice daily, nitroglycerin  as needed  Atrial fibrillation She remains asymptomatic and heart rate well-controlled today at 74 bpm - Continue Eliquis  2.5 mg twice daily and carvedilol  3.125 mg twice daily  Hyperlipidemia LDL 41, TG 89, HDL 47 01/2023 LDL under excellent control and under goal less than 70 She will need a repeat fasting lipid panel at follow-up visit - Continue atorvastatin  80 mg daily  HFpEF Echocardiogram 04/2023 with LVEF  55 to 60%, mild LVH - Today patient is euvolemic and well compensated on exam.  NYHA class II - Denies any dyspnea, orthopnea, PND, leg swelling.  Her weight remains stable - Continue carvedilol  3.125 mg twice daily, Farxiga  5 mg daily, Lasix  40 mg twice daily - Daily weights encouraged  Mitral valve stenosis Echocardiogram 04/2023 showed moderate mitral stenosis with moderate mitral annular calcification - Today she remains asymptomatic - Repeat echocardiogram for routine monitoring is scheduled for 05/06/2024  Hypertension Blood pressure today is 110/72 and under excellent control - Maintain home BP log and monitoring - Continue carvedilol  3.125 mg twice daily, hydralazine  75 mg 3 times daily     Dispo:  Return in about 4 months (around 06/29/2024).  Signed, Ava Boatman, NP

## 2024-02-27 NOTE — Patient Instructions (Signed)
 Medication Instructions:  NO CHANGES   Lab Work: NONE   Testing/Procedures: NONE  Follow-Up: At [facility], you and your health needs are our priority.  As part of our continuing mission to provide you with exceptional heart care, our providers are all part of one team.  This team includes your primary Cardiologist (physician) and Advanced Practice Providers or APPs (Physician Assistants and Nurse Practitioners) who all work together to provide you with the care you need, when you need it.  Your next appointment:   4 MONTHS  Provider:   Ola Berger, MD

## 2024-02-28 DIAGNOSIS — E872 Acidosis, unspecified: Secondary | ICD-10-CM | POA: Diagnosis not present

## 2024-02-28 DIAGNOSIS — I129 Hypertensive chronic kidney disease with stage 1 through stage 4 chronic kidney disease, or unspecified chronic kidney disease: Secondary | ICD-10-CM | POA: Diagnosis not present

## 2024-02-28 DIAGNOSIS — N2581 Secondary hyperparathyroidism of renal origin: Secondary | ICD-10-CM | POA: Diagnosis not present

## 2024-02-28 DIAGNOSIS — N184 Chronic kidney disease, stage 4 (severe): Secondary | ICD-10-CM | POA: Diagnosis not present

## 2024-02-28 DIAGNOSIS — D509 Iron deficiency anemia, unspecified: Secondary | ICD-10-CM | POA: Diagnosis not present

## 2024-02-29 ENCOUNTER — Encounter: Payer: Self-pay | Admitting: Family

## 2024-02-29 ENCOUNTER — Ambulatory Visit (INDEPENDENT_AMBULATORY_CARE_PROVIDER_SITE_OTHER): Admitting: Family

## 2024-02-29 VITALS — BP 128/74 | HR 61 | Ht 66.0 in | Wt 165.0 lb

## 2024-02-29 DIAGNOSIS — M542 Cervicalgia: Secondary | ICD-10-CM

## 2024-02-29 NOTE — Progress Notes (Signed)
 [name] is a 88 y.o. female with the following history as recorded in EpicCare:  Patient Active Problem List   Diagnosis Date Noted   Acute bronchitis 05/03/2023   Acute bacterial rhinosinusitis 05/03/2023   Osteoarthritis of right knee 07/04/2022   CKD (chronic kidney disease) stage 4, GFR 15-29 ml/min (HCC) 06/27/2022   Mitral stenosis 04/17/2022   Acute kidney injury superimposed on chronic kidney disease (HCC) 01/02/2022   NSTEMI (non-ST elevated myocardial infarction) (HCC) 12/23/2021   Osteoarthritis of knee 03/30/2021   Bilateral hand pain 03/08/2021   Complete heart block (HCC) 12/13/2020   Pacemaker - MDT 12/13/2020   DOE (dyspnea on exertion) 07/29/2020   Encntr for surgical aftcr following surgery on the circ sys 07/26/2020   Hypertensive heart and chronic kidney disease with heart failure and stage 1 through stage 4 chronic kidney disease, or unspecified chronic kidney disease (HCC) 07/26/2020   Pain in right knee 03/17/2020   Chronic combined systolic (congestive) and diastolic (congestive) heart failure (HCC)    DKA (diabetic ketoacidosis) (HCC) 12/20/2019   Diverticulosis 12/10/2019   Age-related osteoporosis without current pathological fracture 10/03/2019   Chronic kidney disease, stage 3 unspecified (HCC) 10/03/2019   Long term (current) use of insulin  (HCC) 10/03/2019   Hyperlipidemia, unspecified 10/03/2019   Long term (current) use of inhaled steroids 10/03/2019   Shortness of breath 01/30/2018   Acute bronchiolitis 12/17/2017   Wheezing 10/29/2017   Vitamin D  deficiency 07/31/2017   Renal insufficiency 07/31/2017   Knee pain 06/01/2017   Morbid obesity due to excess calories (HCC) 01/25/2017   Diabetes (HCC) 04/12/2016   Cough 01/20/2015   Atrial fibrillation (HCC) 05/28/2014   History of colonic polyps 05/28/2014   Vomiting 01/29/2014   CAD (coronary artery disease) 09/01/2013   Routine general medical examination at a health care facility  07/17/2011   Encounter for long-term (current) use of other medications 07/06/2011   Nonspecific (abnormal) findings on radiological and other examination of body structure 11/09/2009   IRRITABLE BOWEL SYNDROME 05/07/2009   CHEST PAIN 05/07/2009   ADNEXAL MASS, RIGHT 07/22/2008   HEMORRHOIDS, RECURRENT 01/27/2008   Diverticulitis 01/27/2008   OTH ABNORMAL FIND RAD EXAMINATION BREAST 01/27/2008   GERD 01/13/2008   UTI 09/28/2007   Dyslipidemia 05/27/2007   ANEMIA-IRON DEFICIENCY 05/27/2007   ANXIETY 05/27/2007   Essential hypertension 05/27/2007   Seasonal allergic rhinitis 05/27/2007   Cough variant asthma 05/27/2007   Osteoporosis 05/27/2007   DIVERTICULITIS, HX OF 05/27/2007    Current Outpatient Medications  Medication Sig Dispense Refill   acetaminophen  (TYLENOL ) 325 MG tablet Take 650 mg by mouth every 6 (six) hours as needed for pain.     albuterol  (PROAIR  HFA) 108 (90 Base) MCG/ACT inhaler 2 puffs every 4 hours as needed only  if your can't catch your breath 18 g 11   albuterol  (PROVENTIL ) (2.5 MG/3ML) 0.083% nebulizer solution INHALE 3 ML BY NEBULIZATION EVERY 6 HOURS AS NEEDED FOR WHEEZING OR SHORTNESS OF BREATH 180 mL 1   apixaban  (ELIQUIS ) 2.5 MG TABS tablet TAKE 1 TABLET BY MOUTH TWICE A DAY 60 tablet 6   Ascorbic Acid (VITAMIN C) 1000 MG tablet Take 1,000 mg by mouth daily.     atorvastatin  (LIPITOR ) 80 MG tablet TAKE 1 TABLET BY MOUTH EVERY DAY 90 tablet 3   Azelastine  HCl 137 MCG/SPRAY SOLN PLACE 2 SPRAYS INTO BOTH NOSTRILS 2 (TWO) TIMES DAILY AS NEEDED FOR ALLERGIES. USE IN EACH NOSTRIL AS DIRECTED 90 mL 0   BD  VEO INSULIN  SYRINGE U/F 31G X 15/64" 0.5 ML MISC USE AS INSTRUCTED 100 each 3   Blood Glucose Monitoring Suppl (ONETOUCH VERIO FLEX SYSTEM) w/Device KIT USE AS DIRECTED 1 kit .   budesonide -formoterol  (SYMBICORT ) 160-4.5 MCG/ACT inhaler TAKE 2 PUFFS FIRST THING IN MORNING AND THEN ANOTHER 2 PUFFS ABOUT 12 HOURS LATER. 30.6 each 6   carvedilol  (COREG ) 3.125 MG  tablet TAKE 1 TABLET BY MOUTH TWICE A DAY WITH FOOD 180 tablet 2   cetirizine  (ZYRTEC  ALLERGY ) 10 MG tablet Take 1 tablet (10 mg total) by mouth daily. 30 tablet 5   cholecalciferol (VITAMIN D3) 25 MCG (1000 UNIT) tablet Take 1,000 Units by mouth daily. Isn't taking regularly     clopidogrel  (PLAVIX ) 75 MG tablet Take 1 tablet (75 mg total) by mouth daily with breakfast. 90 tablet 3   denosumab  (PROLIA ) 60 MG/ML SOSY injection Inject 60 mg into the skin every 6 (six) months. Dx code: M81.0.  Pt has appointment on 11/07/23 1 mL 0   diclofenac  Sodium (VOLTAREN ) 1 % GEL Apply 4 g topically 4 (four) times daily as needed (pain). 150 g 0   famotidine  (PEPCID ) 20 MG tablet Take 1 tablet (20 mg total) by mouth 2 (two) times daily. 180 tablet 2   FARXIGA  5 MG TABS tablet TAKE 1 TABLET BY MOUTH EVERY DAY BEFORE BREAKFAST 30 tablet 3   fluticasone  (FLONASE ) 50 MCG/ACT nasal spray Place 2 sprays into both nostrils daily. 16 g 5   furosemide  (LASIX ) 40 MG tablet TAKE 1 TABLET BY MOUTH TWICE A DAY 180 tablet 3   glucose blood (ONETOUCH VERIO) test strip 1 each by Other route 2 (two) times daily. And lancets 2/day 200 each 3   glucose blood test strip Check blood sugar twice a day 100 each 12   hydrALAZINE  (APRESOLINE ) 25 MG tablet Take 3 tablets (75 mg total) by mouth 3 (three) times daily. 810 tablet 3   insulin  lispro (HUMALOG ) 100 UNIT/ML injection Give 3 units with BREAKFAST, AND 18 units with SUPPER 60 mL 1   latanoprost  (XALATAN ) 0.005 % ophthalmic solution Place 1 drop into both eyes at bedtime.     lidocaine  (LIDODERM ) 5 % Place 1 patch onto the skin daily. Remove & Discard patch within 12 hours or as directed by MD 30 patch 0   methylPREDNISolone  (MEDROL  DOSEPAK) 4 MG TBPK tablet Take with signs of chronic sinusitis and take as directed 1 each 1   montelukast  (SINGULAIR ) 10 MG tablet Take 1 tablet (10 mg total) by mouth at bedtime. 90 tablet 3   nitroGLYCERIN  (NITROSTAT ) 0.4 MG SL tablet Place 1 tablet  (0.4 mg total) under the tongue every 5 (five) minutes x 3 doses as needed for chest pain. 25 tablet 2   ondansetron  (ZOFRAN ) 8 MG tablet TAKE 1 TABLET BY MOUTH EVERY 8 HOURS AS NEEDED FOR NAUSEA OR VOMITING. 18 tablet 1   oxyCODONE  (ROXICODONE ) 5 MG immediate release tablet Take 1 tablet (5 mg total) by mouth every 6 (six) hours as needed for up to 10 doses. 10 tablet 0   pantoprazole  (PROTONIX ) 40 MG tablet Take 1 tablet (40 mg total) by mouth daily. 180 tablet 3   potassium chloride  (KLOR-CON ) 10 MEQ tablet TAKE 1 TABLET BY MOUTH EVERY DAY 90 tablet 2   promethazine -dextromethorphan (PROMETHAZINE -DM) 6.25-15 MG/5ML syrup Take 5 mLs by mouth 4 (four) times daily as needed for cough. 240 mL 0   sodium bicarbonate 650 MG tablet Take 650 mg by  mouth 2 (two) times daily.     Syringe/Needle, Disp, (SYRINGE 3CC/25GX1") 25G X 1" 3 ML MISC Use to inject into the skin 2x a day 100 each 3   tiZANidine  (ZANAFLEX ) 2 MG tablet Take 1 tablet (2 mg total) by mouth every 8 (eight) hours as needed for muscle spasms. 30 tablet 0   traZODone  (DESYREL ) 50 MG tablet Take 0.5 tablets (25 mg total) by mouth at bedtime as needed for sleep. 15 tablet 0   vitamin B-12 (CYANOCOBALAMIN ) 100 MCG tablet Take 100 mcg by mouth daily.     Current Facility-Administered Medications  Medication Dose Route Frequency Provider Last Rate Last Admin   [START ON 04/05/2024] denosumab  (PROLIA ) injection 60 mg  60 mg Subcutaneous Once Adra Alanis, FNP        Allergies: Aspirin , Ramipril, and Doxycycline   Past Medical History:  Diagnosis Date   Allergic rhinitis    Anterior chest wall pain    Anxiety    Asthma    Chronic diastolic CHF (congestive heart failure) (HCC)    Echo 01/2020: EF 60-65, normal wall motion, mild LVH, normal RV SF, RVSP 52.6 (moderate elevation), severe LAE, moderate RAE, trivial MR, mild MS (mean gradient 5.5 mmHg), mild aortic valve sclerosis (no AS); elevated E/e' c/w elevated LVEDP   Cough    DM  type 2 (diabetes mellitus, type 2) (HCC)    GERD (gastroesophageal reflux disease)    History of diverticulitis of colon    HTN (hypertension)    Hyperlipidemia    IBS (irritable bowel syndrome)    Iron deficiency anemia    Mitral stenosis 04/17/2022   Echo 04/2022: EF 55-60, no RWMA, mild LVH, normal RVSF, normal PASP, moderate LAE, moderate mitral stenosis (mean gradient 4 mmHg), trivial MR, trivial AI, AV sclerosis without stenosis   Osteoporosis, unspecified    Renal insufficiency 07/31/2017   UTI (urinary tract infection)     Past Surgical History:  Procedure Laterality Date   CARDIOVASCULAR STRESS TEST  02/25/04   CORONARY BALLOON ANGIOPLASTY N/A 12/28/2021   Procedure: CORONARY BALLOON ANGIOPLASTY;  Surgeon: Odie Benne, MD;  Location: [facility] INVASIVE CV LAB;  Service: Cardiovascular;  Laterality: N/A;   CORONARY STENT INTERVENTION N/A 12/28/2021   Procedure: CORONARY STENT INTERVENTION;  Surgeon: Odie Benne, MD;  Location: [facility] INVAS[facility] CV [facility];  Service: Cardiovascular;  Laterality: N/A;   ESOPHAGOGASTRODUODENOSCOPY  12/26/01   PACEMAKER IMPLANT N/A 08/02/2020   Procedure: PACEMAKER IMPLANT;  Surgeon: Verona Goodwill, MD;  Location: [facility] INVASIVE CV LAB;  Service: Cardiovascular;  Laterality: N/A;   RIGHT OOPHORECTOMY  jan 2010   RIGHT/LEFT HEART CATH AND CORONARY ANGIOGRAPHY N/A 12/28/2021   Procedure: RIGHT/LEFT HEART CATH AND CORONARY ANGIOGRAPHY;  Surgeon: Odie Benne, MD;  Location: [facility] INVASIVE CV LAB;  Service: Cardiovascular;  Laterality: N/A;    Family History  Problem Relation Age of Onset   Hypertension Mother    Stroke Mother    Other Father        poor circulation   Cancer Sister    Lung cancer Brother    Melanoma Brother    Diabetes Daughter    Colon cancer Neg Hx    Liver disease Neg Hx    Esophageal cancer Neg Hx     Social History   Tobacco Use   Smoking status: Former    Current packs/day: 0.00    Average packs/day: 0.3  packs/day for 5.0 years (1.5 ttl pk-yrs)    Types: Cigarettes  Start date: 10/02/1981    Quit date: 10/02/1986    Years since quitting: 37.4   Smokeless tobacco: Never  Substance Use Topics   Alcohol use: No    Subjective:   Accompanied by daughter; was seen at ER on 5/20 with persisting neck and shoulder pain; similar OV here at the end of April- she declined PT or seeing specialist at that time; is scheduled to meet with orthopedist at [facility] on June 9 to discuss chronic neck issues;   Objective:  Vitals:   02/29/24 0943  BP: 128/74  Pulse: 61  SpO2: 99%  Weight: 165 lb (74.8 kg)  Height: 5\' 6"  (1.676 m)    General: Well developed, well nourished, in no acute distress  Skin : Warm and dry.  Head: Normocephalic and atraumatic  Lungs: Respirations unlabored; clear to auscultation bilaterally without wheeze, rales, rhonchi  CVS exam: normal rate and regular rhythm.  Neurologic: Alert and oriented; speech intact; face symmetrical; moves all extremities well; CNII-XII intact without focal deficit   Assessment:  1. Neck pain     Plan:  Reviewed and discussed imaging done at ER- agree with plan to see orthopedist; discussed that she may benefit from steroid injection and/or PT; will try to go ahead and get home health PT started; patient and daughter agree with treatment plan and will keep follow up with orthopedist on June 9.   No follow-ups on file.  Orders Placed This Encounter  Procedures   Ambulatory referral to Home Health    Referral Priority:   Routine    Referral Type:   Home Health Care    Referral Reason:   Specialty Services Required    Requested Specialty:   Home Health Services    Number of Visits Requested:   1    Requested Prescriptions    No prescriptions requested or ordered in this encounter

## 2024-03-02 ENCOUNTER — Other Ambulatory Visit: Payer: Self-pay | Admitting: Family

## 2024-03-02 ENCOUNTER — Other Ambulatory Visit: Payer: Self-pay | Admitting: Internal Medicine

## 2024-03-02 DIAGNOSIS — B9689 Other specified bacterial agents as the cause of diseases classified elsewhere: Secondary | ICD-10-CM

## 2024-03-03 ENCOUNTER — Other Ambulatory Visit (HOSPITAL_COMMUNITY): Payer: 59

## 2024-03-04 ENCOUNTER — Ambulatory Visit (INDEPENDENT_AMBULATORY_CARE_PROVIDER_SITE_OTHER): Payer: 59 | Admitting: Podiatry

## 2024-03-04 DIAGNOSIS — Z91198 Patient's noncompliance with other medical treatment and regimen for other reason: Secondary | ICD-10-CM

## 2024-03-04 NOTE — Patient Instructions (Incomplete)
 Allergic Rhinitis: - Positive skin test 06/2023: cockroach, ragweed  - Avoidance measures discussed. - Use nasal saline rinses before nose sprays such as with Neilmed Sinus Rinse.  Use distilled water.   - Use Flonase  2 sprays each nostril daily. Aim upward and outward. - Use Azelastine  2 sprays each nostril twice daily as needed for congestion/drainage.  Aim upward and outward.   - Use Zyrtec  10 mg daily.  - Of note, she is not an allergy  shot candidate due to her significant cardiac history with NSTEMI s/p PCI, complete heart block s/p PPM, paroxysmal Afib.     Elevated IgE - Elevated IgE with normal eosinophil count is nonspecific.   - IgE 09/05/23 623- trending down - normal serum electrophoresis, no M spike observed. Elevated kappa and lambda light chains with normal ratio is likely due to her chronic kidney disease.    Cough Variant Asthma - Follow up with Dr. Waymond Hailey.  On Symbicort  and PRN Albuterol .  Seborrheic Keratosis  - Continue to follow up with dermatology

## 2024-03-05 ENCOUNTER — Ambulatory Visit (INDEPENDENT_AMBULATORY_CARE_PROVIDER_SITE_OTHER): Admitting: Podiatry

## 2024-03-05 ENCOUNTER — Encounter: Payer: Self-pay | Admitting: Family

## 2024-03-05 ENCOUNTER — Ambulatory Visit: Payer: 59 | Admitting: Internal Medicine

## 2024-03-05 ENCOUNTER — Other Ambulatory Visit: Payer: Self-pay

## 2024-03-05 ENCOUNTER — Ambulatory Visit (INDEPENDENT_AMBULATORY_CARE_PROVIDER_SITE_OTHER): Admitting: Family

## 2024-03-05 VITALS — BP 100/60 | HR 63 | Temp 97.2°F | Resp 16 | Ht 66.0 in | Wt 167.9 lb

## 2024-03-05 DIAGNOSIS — R768 Other specified abnormal immunological findings in serum: Secondary | ICD-10-CM

## 2024-03-05 DIAGNOSIS — L821 Other seborrheic keratosis: Secondary | ICD-10-CM | POA: Diagnosis not present

## 2024-03-05 DIAGNOSIS — J302 Other seasonal allergic rhinitis: Secondary | ICD-10-CM | POA: Diagnosis not present

## 2024-03-05 DIAGNOSIS — Z91198 Patient's noncompliance with other medical treatment and regimen for other reason: Secondary | ICD-10-CM

## 2024-03-05 DIAGNOSIS — J3089 Other allergic rhinitis: Secondary | ICD-10-CM | POA: Diagnosis not present

## 2024-03-05 NOTE — Progress Notes (Signed)
 [address] [place] [address] Dept: [phone]  FOLLOW UP NOTE  Patient ID: [name], female    DOB: 02-26-1936  Age: 88 y.o. MRN: [id number] Date of Office Visit: 03/05/2024  Assessment  Chief Complaint: Follow-up (Allergies/Asthma/No concerns)  HPI [name] is an 88 year old female who presents today for follow-up of seasonal and perennial allergic rhinitis, elevated IgE, cough variant asthma, and seborrheic keratosis.  She was last seen on September 03, 2023 by Dr. Lydia Sams.  Her daughter is here with her today and helps provide history.  They deny any new diagnosis or surgery since her last office visit.  Seasonal and perennial allergic rhinitis: She is currently not using Flonase  nasal spray often and is not using azelastine  nasal spray.  Her daughter is not certain if they have azelastine .  She does take Zyrtec  10 mg daily.  She reports nasal congestion and postnasal drip.  She denies rhinorrhea, sinus tenderness, sinus pressure, fever, and chills.  Since her last office visit she has not been treated for any sinus infections since we last saw her.  Since her last office visit she did have a CT maxillofacial without contrast on 03/2024 showing:  "IMPRESSION: Left submandibular sialoadenitis with numerous stones."  She was treated with amoxicillin  500 mg twice a day and followed up with ENT for sialoadenitis.  She reports that she was instructed by ENT to monitor for symptoms.  Cough variant asthma: She continues to follow-up with Dr. Waymond Hailey and continues taking Symbicort  and albuterol  as needed.  Seborrheic keratosis: Her daughter thinks that she has an upcoming appointment in October with dermatology.  After looking in epic it looks like she has an upcoming new patient appointment with dermatology on October 23 at 9 AM with Dr. Myrtie Atkinson.   Drug Allergies:  Allergies  Allergen Reactions   Aspirin  Shortness Of Breath and Other (See Comments)    Caused asthma symptoms   Ramipril  Cough   Doxycycline      Nausea    Review of Systems: Negative except as per HPI   Physical Exam: BP 100/60   Pulse 63   Temp (!) 97.2 F (36.2 C)   Resp 16   Ht 5\' 6"  (1.676 m)   Wt 167 lb 14.4 oz (76.2 kg)   SpO2 99%   BMI 27.10 kg/m    Physical Exam Constitutional:      Appearance: Normal appearance.  HENT:     Head: Normocephalic and atraumatic.     Comments: Pharynx normal, eyes normal, ears norma-dry cerumen noted in bilateral canals , nose: Bilateral lower turbinates mildly edematous with no drainage noted    Right Ear: Tympanic membrane, ear canal and external ear normal.     Left Ear: Tympanic membrane, ear canal and external ear normal.     Mouth/Throat:     Mouth: Mucous membranes are moist.     Pharynx: Oropharynx is clear.  Eyes:     Conjunctiva/sclera: Conjunctivae normal.  Cardiovascular:     Rate and Rhythm: Regular rhythm.     Heart sounds: Normal heart sounds.  Pulmonary:     Effort: Pulmonary effort is normal.     Breath sounds: Normal breath sounds.     Comments: Lungs clear to auscultation Musculoskeletal:     Cervical back: Neck supple.  Skin:    General: Skin is warm.  Neurological:     Mental Status: She is alert and oriented to person, place, and time.  Psychiatric:  Mood and Affect: Mood normal.        Behavior: Behavior normal.        Thought Content: Thought content normal.        Judgment: Judgment normal.     Diagnostics:  None  Assessment and Plan: 1. Seasonal and perennial allergic rhinitis   2. Elevated IgE level   3. Seborrheic keratosis     No orders of the defined types were placed in this encounter.   Patient Instructions  Allergic Rhinitis: - Positive skin test 06/2023: cockroach, ragweed  - Avoidance measures discussed. - Use nasal saline rinses before nose sprays such as with Neilmed Sinus Rinse.  Use distilled water.   - Use Flonase  2 sprays each nostril daily. Aim upward and outward.  Try using this  more consistently to see if it helps with the nasal congestion - Use Azelastine  2 sprays each nostril twice daily as needed for congestion/drainage.  Aim upward and outward.  It looks like a prescription was sent for this recently.  Please let me know if you are not able to get this medication.  You can also buy this medication over-the-counter - Use Zyrtec  10 mg daily.  - Of note, she is not an allergy  shot candidate due to her significant cardiac history with NSTEMI s/p PCI, complete heart block s/p PPM, paroxysmal Afib.     Elevated IgE - Elevated IgE with normal eosinophil count is nonspecific.   - IgE 09/05/23 623- trending down - normal serum electrophoresis, no M spike observed. Elevated kappa and lambda light chains with normal ratio is likely due to her chronic kidney disease.    Cough Variant Asthma - Follow up with Dr. Waymond Hailey.  On Symbicort  and PRN Albuterol .  Seborrheic Keratosis - Keep new patient appointment on July 24, 2024 at 9 AM with Dr. Louana Roup  Follow-up in 4 to 6 months or sooner if needed Return in about 6 months (around 09/04/2024), or if symptoms worsen or fail to improve.    Thank you for the opportunity to care for this patient.  Please do not hesitate to contact me with questions.  Tinnie Forehand, FNP [facility] 

## 2024-03-06 ENCOUNTER — Ambulatory Visit (INDEPENDENT_AMBULATORY_CARE_PROVIDER_SITE_OTHER): Admitting: Otolaryngology

## 2024-03-06 ENCOUNTER — Encounter (INDEPENDENT_AMBULATORY_CARE_PROVIDER_SITE_OTHER): Payer: Self-pay | Admitting: Otolaryngology

## 2024-03-06 ENCOUNTER — Encounter: Payer: Self-pay | Admitting: Internal Medicine

## 2024-03-06 ENCOUNTER — Ambulatory Visit (INDEPENDENT_AMBULATORY_CARE_PROVIDER_SITE_OTHER): Admitting: Internal Medicine

## 2024-03-06 VITALS — BP 126/70 | HR 79 | Ht 66.0 in | Wt 163.2 lb

## 2024-03-06 VITALS — BP 118/81 | HR 63

## 2024-03-06 DIAGNOSIS — G8929 Other chronic pain: Secondary | ICD-10-CM | POA: Diagnosis not present

## 2024-03-06 DIAGNOSIS — K8020 Calculus of gallbladder without cholecystitis without obstruction: Secondary | ICD-10-CM | POA: Diagnosis not present

## 2024-03-06 DIAGNOSIS — R933 Abnormal findings on diagnostic imaging of other parts of digestive tract: Secondary | ICD-10-CM | POA: Diagnosis not present

## 2024-03-06 DIAGNOSIS — K1123 Chronic sialoadenitis: Secondary | ICD-10-CM | POA: Diagnosis not present

## 2024-03-06 DIAGNOSIS — R1310 Dysphagia, unspecified: Secondary | ICD-10-CM | POA: Diagnosis not present

## 2024-03-06 DIAGNOSIS — K5900 Constipation, unspecified: Secondary | ICD-10-CM

## 2024-03-06 DIAGNOSIS — K449 Diaphragmatic hernia without obstruction or gangrene: Secondary | ICD-10-CM

## 2024-03-06 DIAGNOSIS — R110 Nausea: Secondary | ICD-10-CM

## 2024-03-06 DIAGNOSIS — K115 Sialolithiasis: Secondary | ICD-10-CM | POA: Diagnosis not present

## 2024-03-06 DIAGNOSIS — H903 Sensorineural hearing loss, bilateral: Secondary | ICD-10-CM

## 2024-03-06 DIAGNOSIS — R1032 Left lower quadrant pain: Secondary | ICD-10-CM | POA: Diagnosis not present

## 2024-03-06 MED ORDER — LUBIPROSTONE 8 MCG PO CAPS: 8.0000 ug | ORAL_CAPSULE | Freq: Two times a day (BID) | ORAL | 0 refills | Status: DC

## 2024-03-06 MED ORDER — AMOXICILLIN-POT CLAVULANATE 875-125 MG PO TABS: 1.0000 | ORAL_TABLET | Freq: Two times a day (BID) | ORAL | 0 refills | Status: DC

## 2024-03-06 MED ORDER — METHYLPREDNISOLONE 4 MG PO TBPK: ORAL_TABLET | ORAL | 1 refills | Status: DC

## 2024-03-06 NOTE — Progress Notes (Signed)
 Chief Complaint: Dysphagia, LLQ ab pain  HPI : 88 year old female with history of HFpEF, A-fib on Eliquis , CAD on Plavix , DM, GERD, asthma, IBS, prior diverticulitis presents for follow up of dysphagia and LLQ ab pain  Patient has had dysphagia for years. She states that dysphagia has been present since her EGD procedure in 2009.  Her dysphagia has been stable in severity.  Endorses dysphagia to both solids and liquids.  Dysphagia tends to occur in the bottom of her throat, and sometimes she has to regurgitate food back up after eating it.   Interval History:  LLQ ab pain is exactly the same as prior. She is constipated a lot but she had a BM today. Passing a BM sometimes makes the pain better. She does have Miralax but she not taking it regularly. She is on average taking Miralax twice a week. Dysphagia is still the same. She has been using Zofran  to help with nausea, which is slightly worse currently. Denies any breakthrough acid reflux. Denies use of marijuana.   Past Medical History:  Diagnosis Date   Allergic rhinitis    Anterior chest wall pain    Anxiety    Asthma    Chronic diastolic CHF (congestive heart failure) (HCC)    Echo 01/2020: EF 60-65, normal wall motion, mild LVH, normal RV SF, RVSP 52.6 (moderate elevation), severe LAE, moderate RAE, trivial MR, mild MS (mean gradient 5.5 mmHg), mild aortic valve sclerosis (no AS); elevated E/e' c/w elevated LVEDP   Cough    DM type 2 (diabetes mellitus, type 2) (HCC)    GERD (gastroesophageal reflux disease)    History of diverticulitis of colon    HTN (hypertension)    Hyperlipidemia    IBS (irritable bowel syndrome)    Iron deficiency anemia    Mitral stenosis 04/17/2022   Echo 04/2022: EF 55-60, no RWMA, mild LVH, normal RVSF, normal PASP, moderate LAE, moderate mitral stenosis (mean gradient 4 mmHg), trivial MR, trivial AI, AV sclerosis without stenosis   Osteoporosis, unspecified    Renal insufficiency 07/31/2017   UTI  (urinary tract infection)      Past Surgical History:  Procedure Laterality Date   CARDIOVASCULAR STRESS TEST  02/25/04   CORONARY BALLOON ANGIOPLASTY N/A 12/28/2021   Procedure: CORONARY BALLOON ANGIOPLASTY;  Surgeon: Odie Benne, MD;  Location: [facility];  Service: Cardiovascular;  Laterality: N/A;   CORONARY STENT INTERVENTION N/A 12/28/2021   Procedure: CORONARY STENT INTERVENTION;  Surgeon: Odie Benne, MD;  Location: [facility];  Service: Cardiovascular;  Laterality: N/A;   ESOPHAGOGASTRODUODENOSCOPY  12/26/01   PACEMAKER IMPLANT N/A 08/02/2020   Procedure: PACEMAKER IMPLANT;  Surgeon: Verona Goodwill, MD;  Location[facility];  Service: Cardiovascular;  Laterality: N/A;   RIGHT OOPHORECTOMY  jan 2010   RIGHT/LEFT HEART CATH AND CORONARY ANGIOGRAPHY N/A 12/28/2021   Procedure: RIGHT/LEFT HEART CATH AND CORONARY ANGIOGRAPHY;  Surgeon: Odie Benne, MD;  Location: [facility];  Service: Cardiovascular;  Laterality: N/A;   Family History  Problem Relation Age of Onset   Hypertension Mother    Stroke Mother    Other Father        poor circulation   Cancer Sister    Lung cancer Brother    Melanoma Brother    Diabetes Daughter    Colon cancer Neg Hx    Liver disease Neg Hx    Esophageal cancer Neg Hx    Social  History   Tobacco Use   Smoking status: Former    Current packs/day: 0.00    Average packs/day: 0.3 packs/day for 5.0 years (1.5 ttl pk-yrs)    Types: Cigarettes    Start date: 10/02/1981    Quit date: 10/02/1986    Years since quitting: 37.4   Smokeless tobacco: Never  Vaping Use   Vaping status: Never Used  Substance Use Topics   Alcohol use: No   Drug use: No   Current Outpatient Medications  Medication Sig Dispense Refill   acetaminophen  (TYLENOL ) 325 MG tablet Take 650 mg by mouth every 6 (six) hours as needed for pain.     albuterol  (PROAIR  HFA) 108 (90 Base) MCG/ACT inhaler 2 puffs every 4 hours  as needed only  if your can't catch your breath 18 g 11   albuterol  (PROVENTIL ) (2.5 MG/3ML) 0.083% nebulizer solution Take 3 mLs (2.5 mg total) by nebulization every 6 (six) hours as needed for wheezing or shortness of breath. 150 mL 1   amoxicillin -clavulanate (AUGMENTIN ) 875-125 MG tablet Take 1 tablet by mouth 2 (two) times daily. 20 tablet 0   apixaban  (ELIQUIS ) 2.5 MG TABS tablet TAKE 1 TABLET BY MOUTH TWICE A DAY 60 tablet 6   Ascorbic Acid (VITAMIN C) 1000 MG tablet Take 1,000 mg by mouth daily.     atorvastatin  (LIPITOR ) 80 MG tablet TAKE 1 TABLET BY MOUTH EVERY DAY 90 tablet 3   Azelastine  HCl 137 MCG/SPRAY SOLN PLACE 2 SPRAYS INTO BOTH NOSTRILS 2 (TWO) TIMES DAILY AS NEEDED FOR ALLERGIES. USE IN EACH NOSTRIL AS DIRECTED 90 mL 0   BD VEO INSULIN  SYRINGE U/F 31G X 15/64" 0.5 ML MISC USE AS INSTRUCTED 100 each 3   Blood Glucose Monitoring Suppl (ONETOUCH VERIO FLEX SYSTEM) w/Device KIT USE AS DIRECTED 1 kit .   budesonide -formoterol  (SYMBICORT ) 160-4.5 MCG/ACT inhaler TAKE 2 PUFFS FIRST THING IN MORNING AND THEN ANOTHER 2 PUFFS ABOUT 12 HOURS LATER. 30.6 each 6   carvedilol  (COREG ) 3.125 MG tablet TAKE 1 TABLET BY MOUTH TWICE A DAY WITH FOOD 180 tablet 2   cetirizine  (ZYRTEC  ALLERGY ) 10 MG tablet Take 1 tablet (10 mg total) by mouth daily. 30 tablet 5   cholecalciferol (VITAMIN D3) 25 MCG (1000 UNIT) tablet Take 1,000 Units by mouth daily. Isn't taking regularly     clopidogrel  (PLAVIX ) 75 MG tablet Take 1 tablet (75 mg total) by mouth daily with breakfast. 90 tablet 3   denosumab  (PROLIA ) 60 MG/ML SOSY injection Inject 60 mg into the skin every 6 (six) months. Dx code: M81.0.  Pt has appointment on 11/07/23 1 mL 0   diclofenac  Sodium (VOLTAREN ) 1 % GEL Apply 4 g topically 4 (four) times daily as needed (pain). 150 g 0   famotidine  (PEPCID ) 20 MG tablet Take 1 tablet (20 mg total) by mouth 2 (two) times daily. 180 tablet 2   FARXIGA  5 MG TABS tablet TAKE 1 TABLET BY MOUTH EVERY DAY BEFORE  BREAKFAST 30 tablet 3   fluticasone  (FLONASE ) 50 MCG/ACT nasal spray SPRAY 2 SPRAYS INTO EACH NOSTRIL EVERY DAY 48 mL 1   furosemide  (LASIX ) 40 MG tablet TAKE 1 TABLET BY MOUTH TWICE A DAY 180 tablet 3   glucose blood (ONETOUCH VERIO) test strip 1 each by Other route 2 (two) times daily. And lancets 2/day 200 each 3   glucose blood test strip Check blood sugar twice a day 100 each 12   hydrALAZINE  (APRESOLINE ) 25 MG tablet Take 3 tablets (  75 mg total) by mouth 3 (three) times daily. 810 tablet 3   insulin  lispro (HUMALOG ) 100 UNIT/ML injection Give 3 units with BREAKFAST, AND 18 units with SUPPER 60 mL 1   latanoprost  (XALATAN ) 0.005 % ophthalmic solution Place 1 drop into both eyes at bedtime.     lidocaine  (LIDODERM ) 5 % Place 1 patch onto the skin daily. Remove & Discard patch within 12 hours or as directed by MD 30 patch 0   methylPREDNISolone  (MEDROL  DOSEPAK) 4 MG TBPK tablet Take with signs of chronic sinusitis and take as directed 1 each 1   montelukast  (SINGULAIR ) 10 MG tablet Take 1 tablet (10 mg total) by mouth at bedtime. 90 tablet 3   nitroGLYCERIN  (NITROSTAT ) 0.4 MG SL tablet Place 1 tablet (0.4 mg total) under the tongue every 5 (five) minutes x 3 doses as needed for chest pain. 25 tablet 2   ondansetron  (ZOFRAN ) 8 MG tablet TAKE 1 TABLET BY MOUTH EVERY 8 HOURS AS NEEDED FOR NAUSEA OR VOMITING. 18 tablet 1   oxyCODONE  (ROXICODONE ) 5 MG immediate release tablet Take 1 tablet (5 mg total) by mouth every 6 (six) hours as needed for up to 10 doses. 10 tablet 0   potassium chloride  (KLOR-CON ) 10 MEQ tablet TAKE 1 TABLET BY MOUTH EVERY DAY 90 tablet 2   promethazine -dextromethorphan (PROMETHAZINE -DM) 6.25-15 MG/5ML syrup Take 5 mLs by mouth 4 (four) times daily as needed for cough. 240 mL 0   sodium bicarbonate 650 MG tablet Take 650 mg by mouth 2 (two) times daily.     Syringe/Needle, Disp, (SYRINGE 3CC/25GX1") 25G X 1" 3 ML MISC Use to inject into the skin 2x a day 100 each 3   tiZANidine   (ZANAFLEX ) 2 MG tablet Take 1 tablet (2 mg total) by mouth every 8 (eight) hours as needed for muscle spasms. 30 tablet 0   traZODone  (DESYREL ) 50 MG tablet Take 0.5 tablets (25 mg total) by mouth at bedtime as needed for sleep. 15 tablet 0   ULTRAM  50 MG tablet Take 50 mg by mouth every 6 (six) hours as needed.     vitamin B-12 (CYANOCOBALAMIN ) 100 MCG tablet Take 100 mcg by mouth daily.     pantoprazole  (PROTONIX ) 40 MG tablet Take 1 tablet (40 mg total) by mouth daily. (Patient not taking: Reported on 03/06/2024) 180 tablet 3   Current Facility-Administered Medications  Medication Dose Route Frequency Provider Last Rate Last Admin   [START ON 04/05/2024] denosumab  (PROLIA ) injection 60 mg  60 mg Subcutaneous Once Adra Alanis, FNP       Allergies  Allergen Reactions   Aspirin  Shortness Of Breath and Other (See Comments)    Caused asthma symptoms   Ramipril Cough   Doxycycline      Nausea     Physical Exam: BP 126/70 (BP Location: Right Arm, Patient Position: Sitting, Cuff Size: Normal)   Pulse 79   Ht 5\' 6"  (1.676 m)   Wt 163 lb 4 oz (74 kg)   BMI 26.35 kg/m  Constitutional: Pleasant,well-developed, female in no acute distress. HEENT: Normocephalic and atraumatic. No oral thrush visualized Cardiovascular: Normal rate, regular rhythm.  Pulmonary/chest: Effort normal and breath sounds normal. No wheezing, rales or rhonchi. Abdominal: Soft, nondistended, tender in the left lower quadrant Extremities: No edema Neurological: Alert and oriented to person place and time. Skin: Skin is warm and dry. No rashes noted. Psychiatric: Normal mood and affect. Behavior is normal.  Labs 04/2023: CMP with nml LFTs, low Na of  133, elevated Cr of 2.74, and glucose of 471.   Labs 05/2023: CBC with low plts of 110. BMP with elevated Cr of 2.82 and mildly low Na of 134. BNP mildly elevated at 214. TSH nml. ESR nml.   Labs 08/2023: CBC with low plts of 139. CMP with elevated Cr of 2.61 and  elevated glucose.  Labs 01/2024: CBC with elevated WBC of 13.8. BMP with elevated Cr of 2.28  Gastric emptying study 02/20/14: IMPRESSION:  Normal exam.   HIDA scan 12/25/21: IMPRESSION: Normal hepatobiliary scan, demonstrating patency of both the cystic and common bile ducts.  CT A/P w/o contrast 07/19/22: IMPRESSION: 1. No acute abdominopelvic findings. 2. Colonic diverticulosis without acute diverticulitis. 3. Cholelithiasis without evidence of acute cholecystitis. 4. Punctate nonobstructing left lower pole renal calculus. 5. Small hiatal hernia. 6. Multichamber cardiomegaly with coronary artery calcifications and aortic valvular and mitral annular calcifications. 7. Similar bibasilar linear atelectasis/scarring with subsegmental mucous plugging and mild bronchiectasis left lower lobe. 8. Aortic Atherosclerosis (ICD10-I70.0).  Chest CT w/o contrast 04/27/23: IMPRESSION: 1. Bronchial wall thickening and mucous plugging in the lower lobes greatest in the left lower lobe. 2. Small hiatal hernia with mild wall thickening of the lower esophagus suggesting esophagitis. 3. Cholelithiasis.  Barium swallow 06/22/23: IMPRESSION: 1. Presbyesophagus with tertiary contractions and corkscrew appearance in the mid and distal esophagus. The contractions and intermittent spasms limit our ability to assess for esophageal narrowings. 2. Initially substantially delayed emptying of the esophagus into the stomach in the upright position. Suspected component of esophageal spasm. Subsequently the esophagus did spontaneously empty into the stomach in the upright position. 3. Small type 1 hiatal hernia. 4. 13 mm barium tablet impacted just above the hiatal hernia.  CT A/P w/o contrast 11/29/23: IMPRESSION: 1. Cholelithiasis. 2. Hepatic cysts. 3. Left vulvar lesion, likely a cyst. This could be assessed further with ultrasound. 4. Aortic atherosclerosis.  EGD 04/02/08:   Colonoscopy 12/18/19:  Excellent prep.    ASSESSMENT AND PLAN: Chronic LLQ ab pain Nausea Constipation Gallstones Dysphagia (present for years) Abnormal barium swallow with hiatal hernia Patient presents with LLQ ab pain that is still persistent. She does see some benefit from passing a BM at times so constipation could be playing a role in her symptoms. Her last CT scan did not show an obvious reason for her LLQ ab pain. She denies any surgeries in the left portion of the abdomen. I asked her to try taking Amitiza to see if this will help with her constipation better than Miralax. Miralax can take a long time to work at times. Another alternative source of LLQ ab pain could be SCAD. I offered the patient an unsedated flexible sigmoidoscopy for further evaluation, but she would like to hold off at this time. Patient still has her chronic dysphagia issues as well.  - Continue daily fiber supplement  - Start Amitiza 8 mcg BID - Consider flexible sigmoidoscopy in the future - Has an upcoming appt with speech therapy - Previously declined EGD - Continue Zofran  PRN - Continue pantoprazole  every day - RTC 1 month  Regino Caprio, MD  I spent 32 minutes of time, including in depth chart review, independent review of results as outlined above, communicating results with the patient directly, face-to-face time with the patient, coordinating care, ordering studies and medications as appropriate, and documentation.

## 2024-03-06 NOTE — Patient Instructions (Addendum)
 We have sent the following medications to your pharmacy for you to pick up at your convenience: Amitiza   If your blood pressure at your visit was 140/90 or greater, please contact your primary care physician to follow up on this.  _______________________________________________________  If you are age 86 or older, your body mass index should be between 23-30. Your Body mass index is 26.35 kg/m. If this is out of the aforementioned range listed, please consider follow up with your Primary Care Provider.  If you are age 70 or younger, your body mass index should be between 19-25. Your Body mass index is 26.35 kg/m. If this is out of the aformentioned range listed, please consider follow up with your Primary Care Provider.   ________________________________________________________  The [facility] providers would like to encourage you to use MYCHART to communicate with providers for non-urgent requests or questions.  Due to long hold times on the telephone, sending your provider a message by [facility] may be a faster and more efficient way to get a response.  Please allow 48 business hours for a response.  Please remember that this is for non-urgent requests.  _______________________________________________________  Thank you for entrusting me with your care and for choosing [facility],  Dr. Regino Caprio

## 2024-03-06 NOTE — Progress Notes (Signed)
 ENT Progress Note:   Update 03/06/2024  Discussed the use of AI scribe software for clinical note transcription with the patient, who gave verbal consent to proceed  History of Present Illness [name] is an 88 year old female with salivary gland stones who presents with swelling on the left side of her face.  She experiences swelling on the left side of her face, describing it as a 'little knot'. These flare-ups have occurred since her last visit, and she manages them with previously prescribed medications, including amoxicillin  and prednisone .  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 There are no new symptoms related to her hearing since the last visit.  She was previously taking pantoprazole  for reflux, but her kidney doctor advised discontinuing it last week, and she has stopped taking it.  Her diabetes is reported to be well-controlled, with no new symptoms or issues related to its management.      Records Reviewed:  Initial Evaluation  Update last OV  Discussed the use of AI scribe software for clinical note transcription with the patient, who gave verbal consent to proceed.  History of Present Illness [name] is a 88 year old female hx of choking on water, and esophageal dysmotility, hx of tinnitus, who presents for follow-up on hearing and swallowing issues. 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  She experiences tinnitus in her left>right ear, although her audiogram indicates symmetric hearing loss in both ears. The audiogram shows normal hearing low-freq, with a slight drop in the mid-frequency range and a more significant drop in high-frequency sounds. She is able to understand 100% of words presented during testing, indicating that her hearing loss is not severely impacting her communication at this time.  She underwent a swallow test which showed a slight decrease in lip seal on left  side but no aspiration or penetration, and normal oropharyngeal swallow. The esophagram performed in the past, did not reveal any strictures, but showed small hiatal hernia and esophageal dysmotility/spasm.   She has a history of multiple stones in her left salivary gland, which causes swelling and occasional mild pain, especially when eating. Previous treatments with antibiotics and steroids provided some relief, but the swelling persists. Imaging shows numerous stones within the gland and along the duct.  She is currently on pantoprazole , which was reduced from twice daily to once daily.    [name] is an 88 year old female with salivary gland stones who presents with swelling on the left side of her face.  She experiences swelling on the left side of her face, describing it as a 'little knot'. These flare-ups have occurred since her last visit, and she manages them with previously prescribed medications, including amoxicillin  and prednisone .  She has a history of hearing loss, confirmed by testing in February. There are no new symptoms related to her hearing since the last visit.  She was previously taking pantoprazole  for reflux, but her kidney doctor advised discontinuing it last week, and she has stopped taking it.  Her diabetes is reported to be well-controlled, with no new symptoms or issues related to its management.  She attempted to use sour candy as a remedy for her salivary gland stones but experienced diarrhea as a side effect, leading her to discontinue this treatment.  Records Reviewed:    Update 11/12/23:  Discussed the use of AI scribe software for clinical note transcription with the patient,  who gave verbal consent to proceed.  History of Present Illness   [name] is an 88 year old female who presents with ringing in the left > right ear for years and swelling in the left salivary gland x 5 days. She was referred by Dr. Ike Malady.  She experiences chronic tinnitus  in both ears, worse on the left for several years, which has worsened recently. There is no associated ear pain, history of ear infections, or prior ear surgeries. A previous hearing test indicated normal hearing, and she has never been advised to use hearing aids. No dizziness/vertigo.  Swelling in the salivary gland began on following a Prolia  injection on Wednesday, 5 days ago. A doctor on Friday attributed the swelling to salivary gland stones, based on results of CT max/face which showed multiple salivary stones in the left SMG gland and duct. She started amoxicillin  on Friday, but it has not alleviated her symptoms. The pain is described as a 'little nagging pain' with a severity of 4 out of 10. She has no prior history of this issue.  She has diabetes managed with insulin  but does not frequently monitor her blood sugar levels.  No history of smoking or heavy alcohol use.     Initial evaluation 10/31/23 Reason for Consult: concern for salivary gland stones and choking when drinking   HPI: Discussed the use of AI scribe software for clinical note transcription with the patient, who gave verbal consent to proceed.  History of Present Illness   The patient is an 11 yoF presents with throat irritation and choking on liquids. She was referred by Dr. Ike Malady for evaluation of salivary stones as well.   Throat irritation and choking on liquids have been ongoing for several years. She experiences choking when drinking water but not with solid foods, although she eats slowly. Occasional pain when swallowing and hoarseness are also reported. A swallow test was conducted due to these symptoms (esophagram) with evidence of dysmotility and corkscrew lower esophageal configuration suggestive of esophageal spasm. She also had small hiatal hernia.   She reports being told she has submandibular stones. Denies hx of submandibular gland swelling, sialoadenitis requiring antibiotics or visits to ED/PCP.   She  has a history of diabetes and uses insulin  for management. She also takes pantoprazole  for reflux and occasionally uses a nasal spray, Astelin . She is currently on a blood thinner, Eliquis , and another unspecified medication.  She has a history of asthma and uses an inhaler. She occasionally takes prednisone  as needed for breathing difficulties. She has a history of smoking but quit a long time ago. No diagnosis of COPD or emphysema is present.  There is a history of pneumonia, but it occurred a long time ago. No recent weight changes or strokes are reported.  She had a heart attack over a year ago, treated with a stent placement.     Records Reviewed:  ED visit 08/31/23 [name] is a 88 y.o. female with h/o hypertension, CAD, A-fib on Eliquis , diabetes, hypertension presents to the emergency department today for evaluation of left arm tingling for the past few days.  Reports is intermittent without any exacerbating or relieving factors.  She is not having any chest pain or shortness of breath.  She does not feel weak in the arm.  She denies any numbness or loss of sensation into it.  She denies any recent injury. Denies any swelling to the area.  She reports that she has had a heart  attack before and was concerned that she was having one again, but did not feel similar to her previous one. Allergic to ASA.  88 y.o. female presents to the ER for evaluation of Left arm tingling intermittently. Differential diagnosis includes but is not limited to cervical radiculopathy, ACS, MSK. Vital signs Mildly elevated BP, borderline bradycardia, otherwise unremarkable. Physical exam as noted above.    CT scan of the head shows 1. No acute intracranial process. 2. 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 Per radiologist's interpretation.     CT cervical spine shows  1. No acute displaced fracture or traumatic listhesis of the cervical spine. 2. Please see separately dictated CT head  08/31/2023. 3. Left submandibular gland calcifications.    I independently reviewed and interpreted the patient's labs.  CBC shows chronic thrombocytopenia otherwise unremarkable.  No leukocytosis or anemia.  CMP shows glucose of 348 with a decrease in bicarb of 17 however closed anion gap.  Creatinine at baseline.  Sodium mildly low at 134 however likely pseudohyponatremia.  No other electrolyte or LFT abnormality.  Troponin at 21 which is her baseline.  Repeat was at 28.   The patient does have a decrease in bicarb at 17, but has a closed gap. She did not give herself her insulin  this morning. Encourage her to remember to take her nightly dose and to be checking her BS more often. Doubt DKA. Recommended fluid and diet changes.    EKG reviewed and interpreted by my attending and read as Ventricular-paced rhythm.   She is not having any chest pain or SOB. She does not feel weak in the arm. She reports she has feeling in the arm, it just feels tingling/pins & needles. Troponins are at the baseline. She is well appearing. Arm symptoms are not reproduced with movement of the head/neck. There are now overlying skin changes. Coloration, size, and temperature appear and feel symmetric. Equal grip strength. Strength is 5/5 in the patient's bilateral upper extremities. CT head and neck do not shows any signs of acute stroke. 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 She is likely having some radiculopathy from the arthritis in her neck. My attending assessed at bedside and agrees. Will discharge home. Recommend Tylenol  and lidocaine  patches and following up with PCP.     Past Medical History:  Diagnosis Date   Allergic rhinitis    Anterior chest wall pain    Anxiety    Asthma    Chronic diastolic CHF (congestive heart failure) (HCC)    Echo 01/2020: EF 60-65, normal wall motion, mild LVH, normal RV SF, RVSP 52.6 (moderate elevation), severe LAE, moderate RAE, trivial MR, mild MS (mean gradient  5.5 mmHg), mild aortic valve sclerosis (no AS); elevated E/e' c/w elevated LVEDP   Cough    DM type 2 (diabetes mellitus, type 2) (HCC)    GERD (gastroesophageal reflux disease)    History of diverticulitis of colon    HTN (hypertension)    Hyperlipidemia    IBS (irritable bowel syndrome)    Iron deficiency anemia    Mitral stenosis 04/17/2022   Echo 04/2022: EF 55-60, no RWMA, mild LVH, normal RVSF, normal PASP, moderate LAE, moderate mitral stenosis (mean gradient 4 mmHg), trivial MR, trivial AI, AV sclerosis without stenosis   Osteoporosis, unspecified    Renal insufficiency 07/31/2017   UTI (urinary tract infection)     Past Surgical History:  Procedure Laterality Date   CARDIOVASCULAR STRESS TEST  02/25/04  CORONARY BALLOON ANGIOPLASTY N/A 12/28/2021   Procedure: CORONARY BALLOON ANGIOPLASTY;  Surgeon: Odie Benne, MD;  Location: [facility];  Service: Cardiovascular;  Laterality: N/A;   CORONARY STENT INTERVENTION N/A 12/28/2021   Procedure: CORONARY STENT INTERVENTION;  Surgeon: Odie Benne, MD;  Location: [facility];  Service: Cardiovascular;  Laterality: N/A;   ESOPHAGOGASTRODUODENOSCOPY  12/26/01   PACEMAKER IMPLANT N/A 08/02/2020   Procedure: PACEMAKER IMPLANT;  Surgeon: Verona Goodwill, MD;  Location: [facility];  Service: Cardiovascular;  Laterality: N/A;   RIGHT OOPHORECTOMY  jan 2010   RIGHT/LEFT HEART CATH AND CORONARY ANGIOGRAPHY N/A 12/28/2021   Procedure: RIGHT/LEFT HEART CATH AND CORONARY ANGIOGRAPHY;  Surgeon: Odie Benne, MD;  Location: [facility];  Service: Cardiovascular;  Laterality: N/A;    Family History  Problem Relation Age of Onset   Hypertension Mother    Stroke Mother    Other Father        poor circulation   Cancer Sister    Lung cancer Brother    Melanoma Brother    Diabetes Daughter    Colon cancer Neg Hx    Liver disease Neg Hx    Esophageal cancer Neg Hx     Social History:   reports that she quit smoking about 37 years ago. Her smoking use included cigarettes. She started smoking about 42 years ago. She has a 1.5 pack-year smoking history. She has never used smokeless tobacco. She reports that she does not drink alcohol and does not use drugs.  Allergies:  Allergies  Allergen Reactions   Aspirin  Shortness Of Breath and Other (See Comments)    Caused asthma symptoms   Ramipril Cough   Doxycycline      Nausea    Medications: I have reviewed the patient's current medications.  The PMH, PSH, Medications, Allergies, and SH were reviewed and updated.  ROS: Constitutional: Negative for fever, weight loss and weight gain. Cardiovascular: Negative for chest pain and dyspnea on exertion. Respiratory: Is not experiencing shortness of breath at rest. Gastrointestinal: Negative for nausea and vomiting. Neurological: Negative for headaches. Psychiatric: The patient is not nervous/anxious  Blood pressure 118/81, pulse 63, SpO2 100%.  PHYSICAL EXAM:  Exam: General: Well-developed, well-nourished Respiratory Respiratory effort: Equal inspiration and expiration without stridor Cardiovascular Peripheral Vascular: Warm extremities with equal color/perfusion Eyes: No nystagmus with equal extraocular motion bilaterally Neuro/Psych/Balance: Patient oriented to person, place, and time; Appropriate mood and affect; Gait is intact with no imbalance; Cranial nerves I-XII are intact Head and Face Inspection: Normocephalic and atraumatic without mass or lesion Salivary Glands: Swelling enlargement but no tenderness of the left SMG gland, palpable stone along the left Wharton's duct near the opening with mild tenderness.  Facial Strength: Facial motility symmetric and full bilaterally ENT Pinna: External ear intact and fully developed External canal: Canals with cerumen and dry skin, cleared (see procedure note) Tympanic Membrane: Clear once cerumen dry skin  removed External Nose: No scar or anatomic deformity Lips, Teeth, and gums: Mucosa and teeth intact and viable TMJ: No pain to palpation  full mobility Oral cavity/oropharynx: No erythema or exudate, no lesions present Neck Neck and Trachea: Midline trachea without mass or lesion Thyroid : No mass or nodularity Lymphatics: No lymphadenopathy   Studies Reviewed: Neck U/S 09/14/23 CLINICAL DATA:  Left submandibular swelling   EXAM: ULTRASOUND OF HEAD/NECK SOFT TISSUES   TECHNIQUE: Ultrasound examination of the head and neck soft tissues was performed in the area of  clinical concern.   COMPARISON:  CT 08/31/2023   FINDINGS: Left submandibular calcifications presumably sialoliths corresponding to previous findings on CT. No mass or adenopathy. Limited contralateral images normal.   IMPRESSION: Left submandibular sialoliths.   Esophagram 06/22/23 IMPRESSION: 1. Presbyesophagus with tertiary contractions and corkscrew appearance in the mid and distal esophagus. The contractions and intermittent spasms limit our ability to assess for esophageal narrowings. 2. Initially substantially delayed emptying of the esophagus into the stomach in the upright position. Suspected component of esophageal spasm. Subsequently the esophagus did spontaneously empty into the stomach in the upright position. 3. Small type 1 hiatal hernia. 4. 13 mm barium tablet impacted just above the hiatal hernia.  CT max/face w/o contrast 11/08/23 FINDINGS: Osseous: No acute fracture or destructive process.   Orbits: Bilateral cataract extraction.   Sinuses: Minimal debris in the left sphenoid sinus. Trace right mastoid fluid.   Soft tissues: Asymmetrically enlarged left submandibular gland which appears edematous with mild swelling in the overlying soft tissues. Numerous calculi throughout the left submandibular gland and throughout the left submandibular duct including a 15 x 7 mm calculus in the  distal aspect of the duct. Unremarkable noncontrast appearance of the right submandibular and both parotid glands.   Limited intracranial: Calcified atherosclerosis of the carotid siphons and vertebral arteries.   IMPRESSION: Left submandibular sialoadenitis with numerous stones.    Audiogram  11/22/23 Results:  The test results were reviewed with Adriana Hopping and her daughter. Cherlynn has a mild hearing loss in both ears. She does not need hearing aids. The loss is the same in both ears. The tinnitus is matched to around [facility] in the left ear. Recommend using masking sounds to cover the tinnitus, especially at night. Repeat hearing tests annually. If new symptoms arise, such as asymmetric hearing loss pain or pressure see PCP or Otolaryngology.  Audiogram printed and provided to [facility]. Tinnitus packet provided to patient, she was counseled on masking concepts and options.     MBS 11/21/23 Clinical Impression: Clinical Impression: Patient presents with a remarkably normal oropharyngeal swallow ability. She is notable to have right facial asymmetry and decreased right labial seal.  No aspiration or penetration and swallow was strong and timely.   Liquid swallows trigger at pyriform sinus and solids at vallecular space.   Patient was not observed to cough prior to, during or after evaluation. She was challenged with sequential swallows of ultra thin barium *half thin barium, 1/2 water*.  Suspect her "choking" and "coughing" with thin liquids is likely due to known esophageal dysphagia including dysmotility, spasms, and hiatal hernia.  Provided education for compensation strategies for known esophageal dysmotility, hiatal hernia, etc verbally. Educated pt and her daughter to remarkable swallow function, especially given pt's advanced age of 52!  No SLP follow up indicated. Thanks so much for this consult of this most pleasant pt and her daughter.      Assessment/Plan: Encounter Diagnoses  Name Primary?    Sensorineural hearing loss (SNHL) of both ears Yes   Sialolith    Chronic sialoadenitis        Assessment and Plan    Chronic dysphagia with liquids Choking on water for several years, no issues with solid foods. Previous esophagram showed esophageal dysmotility, esophageal spasm, delayed emptying, and small hiatal hernia. Reflux history noted, on PPI currently. Scope exam showed vocal cords moving well and no significant abnormalities at the level of the throat aside from changes c/w GERD LPR. Discussed the need for a modified barium  swallow study to evaluate swallowing at the level of her oropharynx. If the study shows safe swallowing, swallow therapy may be considered. - Order modified barium swallow study - Continue pantoprazole  at current dose  - consider reflux gourmet  - det and lifestyle changes to minimize GERD   GERD LPR  Importance of continuing reflux medication discussed. - Continue pantoprazole  40 mg daily  - consider reflux gourmet  - det and lifestyle changes to minimize GERD   Salivary Gland Stone Large salivary gland stone visualized in the salivary duct (Left SMG duct). Likely present for a long time due to slow growth rate. No current symptoms of swelling, pain, or discomfort. Discussed that if asymptomatic, it may be best to observe, especially considering  her age. If symptoms develop, removal can be considered. - Monitor for symptoms such as swelling, pain, or discomfort - Consider removal if symptoms develop  Asthma Uses inhaler and takes prednisone  as needed for breathing issues. - Continue current asthma management  Diabetes Mellitus On insulin  therapy. - Continue current diabetes management  Coronary Artery Disease Treated with stent placement after heart attack over a year ago. Currently on blood thinners (Eliquis ). - Continue Eliquis   Follow-up - Schedule modified barium swallow study - Follow-up appointment after swallow study to review results  and determine next steps.      Update 11/12/23  Assessment and Plan  Chronic dysphagia with liquids Choking on water for several years, no issues with solid foods. Previous esophagram showed esophageal dysmotility, esophageal spasm, delayed emptying, and small hiatal hernia. Reflux history noted, on PPI currently. Scope exam showed vocal cords moving well and no significant abnormalities at the level of the throat aside from changes c/w GERD LPR. Discussed the need for a modified barium swallow study to evaluate swallowing at the level of her oropharynx. If the study shows safe swallowing, swallow therapy may be considered. - Ordered modified barium swallow study - return after swallow study results are available - Continue pantoprazole  at current dose  - consider reflux gourmet  - det and lifestyle changes to minimize GERD   GERD LPR  Importance of continuing reflux medication discussed. - Continue pantoprazole  40 mg daily  - consider reflux gourmet  - det and lifestyle changes to minimize GERD      Sialolithiasis with Sialadenitis Swelling and pain in the left salivary gland x 5 days and multiple large salivary stones in L SMG and duct on CT max/face non-con, stone in the duct palpable on exam. Discussed medical management options including antibiotics, steroids, warm compresses, sour cough drops, and pain management. Surgical removal may be necessary if symptoms persist. Risks of steroids discussed due to diabetes, including potential hyperglycemia, and she will check her glucose more frequently. - Discontinue amoxicillin  - Prescribe Augmentin  x 10 days - Prescribe Medrol  Dosepak - Advise increased hydration - Recommend sour cough drops - Advise warm compresses and gland massage - Monitor blood sugar levels due to steroid use  Tinnitus Chronic left > right ear tinnitus, worsening over time. No associated ear pain. Previous hearing test was normal,  but done years ago. Discussed that  tinnitus is often due to hearing loss, necessitating a hearing test to determine type and extent. - Order hearing test - Cleared earwax today prior to testing   Diabetes Mellitus Diabetes managed with insulin . Steroid use may elevate blood sugar levels. Emphasized the importance of monitoring blood sugar and adjusting insulin  dosage accordingly. - Monitor blood sugar levels closely - Adjust insulin  dosage  as needed  Follow-up - Schedule follow-up appointment in a few weeks - Reschedule throat follow-up to coincide with hearing test review.      Update last OV Assessment and Plan Assessment & Plan Age-related sensorineural hearing loss symmetric  Symmetric hearing loss with mid-frequency drop, high-frequency loss, c/w presbyacousias. 100% word recognition, minimal communication impact. Hearing aids not needed at this time, could consider in the future.  - Advise annual hearing screening. - Consider hearing aids if communication worsens. - Use hearing protection in noisy environments.  Chronic sialoadenitis, Sialolithiasis with salivary gland swelling on the left Multiple stones in left salivary gland and duct causing swelling and discomfort. Surgery possible but would require left submandibular gland removal. Patient prefers non-surgical management. - Apply warm compresses and take Motrin for discomfort when it occurs. - Prescribe antibiotic and steroid for severe episodes (to be taken only when needed, Augmentin /Medrol  Pack). - Advise sucking on sour candies during symptoms.  Esophageal dysmotility Esophagram suggest dysmotility/esophageal spasm no stricture on esophagram. MBS test showed decreased lip seal, no aspiration/penetration, intact OP swallow. No surgical intervention recommended. - Advise slow/intentional eating/drinking focus on swallowing. - Avoid mixed consistency foods if causing a lot of coughing. - Hold water in mouth briefly before swallowing.  Gastroesophageal  reflux disease (GERD) On pantoprazole , dose reduced to once daily. Long-term use not recommended due to side effects. No Barrett's esophagitis hx, follows with GI. Weaning slowly advised to avoid rebound heartburn. - Wean off pantoprazole  by gradually skipping doses. - Use Reflux Gourmet supplement during weaning. - Resume pantoprazole  if significant heartburn occurs.  Follow-up Annual follow-up recommended for monitoring hearing and ENT issues. - Schedule annual follow-up appointment after hearing test   Salivary gland stone Chronic stone with intermittent flare-ups and low-grade infection. Surgical removal not preferred due to age and anesthesia risks. Conservative management chosen. - Refilled Augmentin  and steroid pack for flare-ups. - Advised hydration, warm compresses, and sour candy to stimulate saliva flow.  Update 03/06/2024  Assessment & Plan  Assessment & Plan Salivary gland stone and recurrent sialoadenitis  Chronic stone with intermittent flare-ups and low-grade infection. Surgical removal not preferred due to age and anesthesia risks. Conservative management chosen. She does not wish to consider any surgical interventions at this point and feels her sx are mild and infrequent.  - Refilled Augmentin  and steroid pack for flare-ups (to take if she has significant discomfort and or pain/swelling) - Advised hydration, warm compresses, and sour candy to stimulate saliva flow. -       Artice Last, MD Otolaryngology [facility] Phone: [phone] Fax: [phone]    03/06/2024, 11:01 AM

## 2024-03-06 NOTE — Patient Instructions (Signed)
 Sialoadenitis (Salivary Gland Infection)    Salivary glands make saliva, or spit. An infection in these glands can make the glands swell and hurt.  An infection can happen when bacteria gets into the gland. This is more common in people who have diabetes, poor tooth care, or stones in these glands. Bacteria can build up and cause an infection if you don't get enough fluids. It can also happen if the flow of saliva gets blocked by a small stone in the gland. A virus can also cause an infection.  Your care depends on the cause. If the problem is caused by bacteria, your doctor may prescribe antibiotics.  Home treatment may help. You can drink more fluids or suck on sugar-free lemon drops to increase the flow of saliva.  Follow-up care is a key part of your treatment and safety. Be sure to make and go to all appointments, and call your doctor if you are having problems. It's also a good idea to know your test results and keep a list of the medicines you take.  How can you care for yourself at home?  If your doctor prescribed antibiotics, take them as directed. Do not stop taking them just because you feel better. You need to take the full course of antibiotics.  Take an over-the-counter pain medicine if needed, such as acetaminophen (Tylenol), ibuprofen (Advil, Motrin), or naproxen (Aleve). Be safe with medicines. Read and follow all instructions on the label.  Do not take two or more pain medicines at the same time unless the doctor told you to. Many pain medicines have acetaminophen, which is Tylenol. Too much acetaminophen (Tylenol) can be harmful.  Drink plenty of fluids. If you have kidney, heart, or liver disease and have to limit fluids, talk with your doctor before you increase the amount of fluids you drink.  Put an ice or heat pack (whichever feels better) on the swollen jaw for 10 to 20 minutes at a time. Put a thin cloth between the ice or heat pack and your skin.  Suck on ice  chips or ice treats such as sugar-free flavoured ice pops. Eat soft foods that do not have to be chewed much.  Use sugar-free gum or candies such as lemon drops. They increase saliva.  Avoid over-the-counter medicines that can give you a dry mouth. These medicines include antihistamines, such as diphenhydramine (Benadryl) or chlorpheniramine.  Gently massage the infected gland.

## 2024-03-09 NOTE — Progress Notes (Signed)
 1. Failure to attend appointment with reason given    Appointment rescheduled by patient.

## 2024-03-09 NOTE — Progress Notes (Signed)
 Failure to attend appointment with reason given Appointment rescheduled by patient.

## 2024-03-10 ENCOUNTER — Telehealth: Payer: Self-pay | Admitting: Internal Medicine

## 2024-03-10 NOTE — Telephone Encounter (Signed)
 Pt daughter stated that pt has not had a BM since starting the Amitiza  on Friday. Pt taking medication twice a day. Pt had recent office visit on 03/06/2024. Daughter  stated that she is still having the LLQ abdominal pain.  Please review and advise.

## 2024-03-10 NOTE — Telephone Encounter (Signed)
 Pt's daughter said the medication that was sent to pharmacy for constipation has not worked yet.  She wants to know if there is anything else she can take

## 2024-03-10 NOTE — Telephone Encounter (Signed)
 Pt daughter Moira Andrews made aware of Dr. Rosaline Coma recommendations. Pt daughter verbalized understanding with all questions answered.

## 2024-03-11 ENCOUNTER — Ambulatory Visit (INDEPENDENT_AMBULATORY_CARE_PROVIDER_SITE_OTHER): Admitting: Podiatry

## 2024-03-11 ENCOUNTER — Encounter: Payer: Self-pay | Admitting: Podiatry

## 2024-03-11 DIAGNOSIS — M79675 Pain in left toe(s): Secondary | ICD-10-CM | POA: Diagnosis not present

## 2024-03-11 DIAGNOSIS — M79674 Pain in right toe(s): Secondary | ICD-10-CM

## 2024-03-11 DIAGNOSIS — E1151 Type 2 diabetes mellitus with diabetic peripheral angiopathy without gangrene: Secondary | ICD-10-CM

## 2024-03-11 DIAGNOSIS — B351 Tinea unguium: Secondary | ICD-10-CM | POA: Diagnosis not present

## 2024-03-13 NOTE — Progress Notes (Signed)
 Remote pacemaker transmission.

## 2024-03-16 NOTE — Progress Notes (Signed)
  Subjective:  Patient ID: [name], female    DOB: 05-Aug-1936,  MRN: [id number]  [name] presents to clinic today for at risk foot care. Pt has h/o NIDDM with PAD and painful elongated mycotic toenails 1-5 bilaterally which are tender when wearing enclosed shoe gear. Pain is relieved with periodic professional debridement.  Chief Complaint  Patient presents with   Diabetes    Clip my toenails.  Glade Lambert - 11/16/2023; A1c - 7.4   New problem(s): None.   PCP is Adra Alanis, FNP.  Allergies  Allergen Reactions   Aspirin  Shortness Of Breath and Other (See Comments)    Caused asthma symptoms   Ramipril Cough   Doxycycline      Nausea    Review of Systems: Negative except as noted in the HPI.  Objective: No changes noted in today's physical examination. There were no vitals filed for this visit. [name] is a pleasant 88 y.o. female in NAD. AAO x 3.  Vascular Examination: CFT <3 seconds b/l. DP pulses 1/4 left foot; 0/4 right foot. PT pulses 2/4 b/l. Digital hair absent. Skin temperature gradient warm to warm b/l. No pain with calf compression. No ischemia or gangrene. No cyanosis or clubbing noted b/l. No edema noted b/l LE.   Neurological Examination: Sensation grossly intact b/l with 10 gram monofilament. Vibratory sensation intact b/l.   Dermatological Examination: Pedal skin warm and supple b/l. No open wounds. No interdigital macerations. Toenails 1-5 b/l thick, discolored, elongated with subungual debris and pain on dorsal palpation.  No hyperkeratotic nor porokeratotic lesions present on today's visit.  Musculoskeletal Examination: Muscle strength 5/5 to all lower extremity muscle groups bilaterally. HAV with bunion deformity noted b/l LE. Tailor's bunion deformity noted b/l LE. Hammertoe deformity noted 2-5 b/l.  Radiographs: None Last HgA1c:      Latest Ref Rng & Units 11/16/2023    8:53 AM 05/09/2023    8:12 AM  Hemoglobin A1C  Hemoglobin-A1c  4.6 - 6.5 % 7.4  9.9     Assessment/Plan: 1. Pain due to onychomycosis of toenails of both feet   2. Type II diabetes mellitus with peripheral circulatory disorder [facility])    Consent given for treatment. Patient examined. All patient's and/or POA's questions/concerns addressed on today's visit. Mycotic toenails 1-5 debrided in length and girth without incident. Continue foot and shoe inspections daily. Monitor blood glucose per PCP/Endocrinologist's recommendations.Continue soft, supportive shoe gear daily. Report any pedal injuries to medical professional. Call office if there are any quesitons/concerns. -Patient/POA to call should there be question/concern in the interim.   Return in about 4 months (around 07/11/2024).  [name], DPM       LOCATION: [address] [place] [address]                   Office [phone]   [facility] LOCATION: [address] [place], [place] [address] Office [phone]

## 2024-03-18 DIAGNOSIS — M25512 Pain in left shoulder: Secondary | ICD-10-CM | POA: Diagnosis not present

## 2024-03-18 DIAGNOSIS — M542 Cervicalgia: Secondary | ICD-10-CM | POA: Diagnosis not present

## 2024-03-21 ENCOUNTER — Other Ambulatory Visit: Payer: Self-pay | Admitting: Family

## 2024-03-21 ENCOUNTER — Other Ambulatory Visit: Payer: Self-pay | Admitting: Internal Medicine

## 2024-03-21 ENCOUNTER — Other Ambulatory Visit: Payer: Self-pay

## 2024-03-21 MED ORDER — LUBIPROSTONE 24 MCG PO CAPS: 24.0000 ug | ORAL_CAPSULE | Freq: Two times a day (BID) | ORAL | 0 refills | Status: DC

## 2024-03-21 NOTE — Telephone Encounter (Signed)
 Pt's pharmacy is requesting a refill on non cardiac medication trazodone . Would Dr. Avanell Bob like to refill this medication? Please address

## 2024-03-21 NOTE — Telephone Encounter (Signed)
 Patients daughter called requesting a refill for Amitiza  medication due to pharmacy not refilling medication. Daughter stated that patient should take 3 tablets twice a day.  Please advise  Thank you

## 2024-03-27 ENCOUNTER — Ambulatory Visit: Payer: 59 | Admitting: Internal Medicine

## 2024-04-07 ENCOUNTER — Emergency Department (HOSPITAL_BASED_OUTPATIENT_CLINIC_OR_DEPARTMENT_OTHER)
Admission: EM | Admit: 2024-04-07 | Discharge: 2024-04-07 | Disposition: A | Discharge status: Home / Self Care | Attending: Student | Admitting: Student

## 2024-04-07 ENCOUNTER — Encounter (HOSPITAL_BASED_OUTPATIENT_CLINIC_OR_DEPARTMENT_OTHER): Payer: Self-pay

## 2024-04-07 ENCOUNTER — Telehealth: Payer: Self-pay

## 2024-04-07 ENCOUNTER — Emergency Department (HOSPITAL_BASED_OUTPATIENT_CLINIC_OR_DEPARTMENT_OTHER)

## 2024-04-07 ENCOUNTER — Other Ambulatory Visit (HOSPITAL_COMMUNITY): Payer: Self-pay

## 2024-04-07 ENCOUNTER — Other Ambulatory Visit: Payer: Self-pay

## 2024-04-07 DIAGNOSIS — Z7901 Long term (current) use of anticoagulants: Secondary | ICD-10-CM | POA: Insufficient documentation

## 2024-04-07 DIAGNOSIS — Z794 Long term (current) use of insulin: Secondary | ICD-10-CM | POA: Insufficient documentation

## 2024-04-07 DIAGNOSIS — I110 Hypertensive heart disease with heart failure: Secondary | ICD-10-CM | POA: Diagnosis not present

## 2024-04-07 DIAGNOSIS — Z79899 Other long term (current) drug therapy: Secondary | ICD-10-CM | POA: Insufficient documentation

## 2024-04-07 DIAGNOSIS — G9389 Other specified disorders of brain: Secondary | ICD-10-CM | POA: Diagnosis not present

## 2024-04-07 DIAGNOSIS — J45909 Unspecified asthma, uncomplicated: Secondary | ICD-10-CM | POA: Diagnosis not present

## 2024-04-07 DIAGNOSIS — R519 Headache, unspecified: Secondary | ICD-10-CM | POA: Insufficient documentation

## 2024-04-07 DIAGNOSIS — F17210 Nicotine dependence, cigarettes, uncomplicated: Secondary | ICD-10-CM | POA: Diagnosis not present

## 2024-04-07 DIAGNOSIS — I5032 Chronic diastolic (congestive) heart failure: Secondary | ICD-10-CM | POA: Insufficient documentation

## 2024-04-07 DIAGNOSIS — E119 Type 2 diabetes mellitus without complications: Secondary | ICD-10-CM | POA: Insufficient documentation

## 2024-04-07 LAB — COMPREHENSIVE METABOLIC PANEL WITH GFR
ALT: 12 U/L (ref 0–44)
AST: 17 U/L (ref 15–41)
Albumin: 3.3 g/dL — ABNORMAL LOW (ref 3.5–5.0)
Alkaline Phosphatase: 85 U/L (ref 38–126)
Anion gap: 14 (ref 5–15)
BUN: 25 mg/dL — ABNORMAL HIGH (ref 8–23)
CO2: 21 mmol/L — ABNORMAL LOW (ref 22–32)
Calcium: 9.6 mg/dL (ref 8.9–10.3)
Chloride: 103 mmol/L (ref 98–111)
Creatinine, Ser: 2.02 mg/dL — ABNORMAL HIGH (ref 0.44–1.00)
GFR, Estimated: 24 mL/min — ABNORMAL LOW (ref >60)
Glucose, Bld: 337 mg/dL — ABNORMAL HIGH (ref 70–99)
Potassium: 4.2 mmol/L (ref 3.5–5.1)
Sodium: 138 mmol/L (ref 135–145)
Total Bilirubin: 0.5 mg/dL (ref 0.0–1.2)
Total Protein: 6.6 g/dL (ref 6.5–8.1)

## 2024-04-07 LAB — CBC
HCT: 33.8 % — ABNORMAL LOW (ref 36.0–46.0)
Hemoglobin: 11.7 g/dL — ABNORMAL LOW (ref 12.0–15.0)
MCH: 28.1 pg (ref 26.0–34.0)
MCHC: 34.6 g/dL (ref 30.0–36.0)
MCV: 81.3 fL (ref 80.0–100.0)
Platelets: 151 K/uL (ref 150–400)
RBC: 4.16 MIL/uL (ref 3.87–5.11)
RDW: 17.8 % — ABNORMAL HIGH (ref 11.5–15.5)
WBC: 8.4 K/uL (ref 4.0–10.5)
nRBC: 0 % (ref 0.0–0.2)

## 2024-04-07 MED ORDER — BUTALBITAL-APAP-CAFFEINE 50-325-40 MG PO TABS: 1.0000 | ORAL_TABLET | Freq: Once | ORAL | Status: DC

## 2024-04-07 MED ORDER — BUTALBITAL-APAP-CAFFEINE 50-325-40 MG PO TABS: 1.0000 | ORAL_TABLET | Freq: Four times a day (QID) | ORAL | 0 refills | Status: DC | PRN

## 2024-04-07 MED ORDER — BUTALBITAL-APAP-CAFFEINE 50-325-40 MG PO TABS: 1.0000 | ORAL_TABLET | Freq: Four times a day (QID) | ORAL | Status: DC | PRN

## 2024-04-07 NOTE — ED Triage Notes (Signed)
 Pt c/o spasms in my head, states that head felt different last night, all day today felt funny. Earlier today felt like there was a ring/ band all around head that's squeezing/ tight, little bit of nausea.

## 2024-04-07 NOTE — Telephone Encounter (Signed)
 Pt ready for scheduling for PROLIA  on or after : 05/06/24  Option# 1: Buy/Bill (Office supplied medication)  Out-of-pocket cost due at time of clinic visit: $0  Number of injection/visits approved: 2  Primary: UHC-MEDICARE DUAL COMPLETE Prolia  co-insurance: 0% Admin fee co-insurance: 0%  Secondary: --- Prolia  co-insurance:  Admin fee co-insurance:   Medical Benefit Details: Date Benefits were checked: 04/07/24 Deductible: $257 Met of $257 Required/ Coinsurance: 0%/ Admin Fee: 0%  Prior Auth: APPROVED PA# [id number] Expiration Date: 09/28/23-09/27/24  # of doses approved: 2 ----------------------------------------------------------------------- Option# 2- Med Obtained from pharmacy:  Pharmacy benefit: Copay $0 (Paid to pharmacy) Admin Fee: 0% (Pay at clinic)  Prior Auth: N/A PA# Expiration Date:   # of doses approved:   If patient wants fill through the pharmacy benefit please send prescription to: [facility], and include estimated need by date in rx notes. Pharmacy will ship medication directly to the office.  Patient NOT eligible for Prolia  Copay Card. Copay Card can make patient's cost as little as $25. Link to apply: [email or web address]  ** This summary of benefits is an estimation of the patient's out-of-pocket cost. Exact cost may very based on individual plan coverage.

## 2024-04-07 NOTE — Telephone Encounter (Signed)
 Prolia  VOB initiated via [email or web address]  Next Prolia  inj DUE: 05/06/24

## 2024-04-07 NOTE — ED Notes (Signed)
 Pt given discharge instructions and reviewed prescriptions. Opportunities given for questions. Pt verbalizes understanding. Jillyn Hidden, RN

## 2024-04-07 NOTE — Telephone Encounter (Signed)
 SABRA

## 2024-04-08 ENCOUNTER — Ambulatory Visit (INDEPENDENT_AMBULATORY_CARE_PROVIDER_SITE_OTHER): Admitting: Family

## 2024-04-08 ENCOUNTER — Encounter: Payer: Self-pay | Admitting: Family

## 2024-04-08 VITALS — BP 132/68 | HR 65 | Ht 66.0 in

## 2024-04-08 DIAGNOSIS — Z794 Long term (current) use of insulin: Secondary | ICD-10-CM

## 2024-04-08 DIAGNOSIS — E1122 Type 2 diabetes mellitus with diabetic chronic kidney disease: Secondary | ICD-10-CM

## 2024-04-08 DIAGNOSIS — G44209 Tension-type headache, unspecified, not intractable: Secondary | ICD-10-CM | POA: Diagnosis not present

## 2024-04-08 NOTE — Progress Notes (Signed)
 [name] is a 88 y.o. female with the following history as recorded in EpicCare:  Patient Active Problem List   Diagnosis Date Noted   Acute bronchitis 05/03/2023   Acute bacterial rhinosinusitis 05/03/2023   Osteoarthritis of right knee 07/04/2022   CKD (chronic kidney disease) stage 4, GFR 15-29 ml/min (HCC) 06/27/2022   Mitral stenosis 04/17/2022   Acute kidney injury superimposed on chronic kidney disease (HCC) 01/02/2022   NSTEMI (non-ST elevated myocardial infarction) (HCC) 12/23/2021   Osteoarthritis of knee 03/30/2021   Bilateral hand pain 03/08/2021   Complete heart block (HCC) 12/13/2020   Pacemaker - MDT 12/13/2020   DOE (dyspnea on exertion) 07/29/2020   Encntr for surgical aftcr following surgery on the circ sys 07/26/2020   Hypertensive heart and chronic kidney disease with heart failure and stage 1 through stage 4 chronic kidney disease, or unspecified chronic kidney disease (HCC) 07/26/2020   Pain in right knee 03/17/2020   Chronic combined systolic (congestive) and diastolic (congestive) heart failure (HCC)    DKA (diabetic ketoacidosis) (HCC) 12/20/2019   Diverticulosis 12/10/2019   Age-related osteoporosis without current pathological fracture 10/03/2019   Chronic kidney disease, stage 3 unspecified (HCC) 10/03/2019   Long term (current) use of insulin  (HCC) 10/03/2019   Hyperlipidemia, unspecified 10/03/2019   Long term (current) use of inhaled steroids 10/03/2019   Shortness of breath 01/30/2018   Acute bronchiolitis 12/17/2017   Wheezing 10/29/2017   Vitamin D  deficiency 07/31/2017   Renal insufficiency 07/31/2017   Knee pain 06/01/2017   Morbid obesity due to excess calories (HCC) 01/25/2017   Diabetes (HCC) 04/12/2016   Cough 01/20/2015   Atrial fibrillation (HCC) 05/28/2014   History of colonic polyps 05/28/2014   Vomiting 01/29/2014   CAD (coronary artery disease) 09/01/2013   Routine general medical examination at a health care facility  07/17/2011   Encounter for long-term (current) use of other medications 07/06/2011   Nonspecific (abnormal) findings on radiological and other examination of body structure 11/09/2009   IRRITABLE BOWEL SYNDROME 05/07/2009   CHEST PAIN 05/07/2009   ADNEXAL MASS, RIGHT 07/22/2008   HEMORRHOIDS, RECURRENT 01/27/2008   Diverticulitis 01/27/2008   OTH ABNORMAL FIND RAD EXAMINATION BREAST 01/27/2008   GERD 01/13/2008   UTI 09/28/2007   Dyslipidemia 05/27/2007   ANEMIA-IRON DEFICIENCY 05/27/2007   ANXIETY 05/27/2007   Essential hypertension 05/27/2007   Seasonal allergic rhinitis 05/27/2007   Cough variant asthma 05/27/2007   Osteoporosis 05/27/2007   DIVERTICULITIS, HX OF 05/27/2007    Current Outpatient Medications  Medication Sig Dispense Refill   acetaminophen  (TYLENOL ) 325 MG tablet Take 650 mg by mouth every 6 (six) hours as needed for pain.     albuterol  (PROAIR  HFA) 108 (90 Base) MCG/ACT inhaler 2 puffs every 4 hours as needed only  if your can't catch your breath 18 g 11   albuterol  (PROVENTIL ) (2.5 MG/3ML) 0.083% nebulizer solution Take 3 mLs (2.5 mg total) by nebulization every 6 (six) hours as needed for wheezing or shortness of breath. 150 mL 1   amoxicillin -clavulanate (AUGMENTIN ) 875-125 MG tablet Take 1 tablet by mouth 2 (two) times daily. 20 tablet 0   apixaban  (ELIQUIS ) 2.5 MG TABS tablet TAKE 1 TABLET BY MOUTH TWICE A DAY 60 tablet 6   Ascorbic Acid (VITAMIN C) 1000 MG tablet Take 1,000 mg by mouth daily.     atorvastatin  (LIPITOR ) 80 MG tablet TAKE 1 TABLET BY MOUTH EVERY DAY 90 tablet 3   Azelastine  HCl 137 MCG/SPRAY SOLN PLACE 2 SPRAYS  INTO BOTH NOSTRILS 2 (TWO) TIMES DAILY AS NEEDED FOR ALLERGIES. USE IN EACH NOSTRIL AS DIRECTED 90 mL 0   BD VEO INSULIN  SYRINGE U/F 31G X 15/64 0.5 ML MISC USE AS INSTRUCTED 100 each 3   Blood Glucose Monitoring Suppl (ONETOUCH VERIO FLEX SYSTEM) w/Device KIT USE AS DIRECTED 1 kit .   budesonide -formoterol  (SYMBICORT ) 160-4.5 MCG/ACT  inhaler TAKE 2 PUFFS FIRST THING IN MORNING AND THEN ANOTHER 2 PUFFS ABOUT 12 HOURS LATER. 30.6 each 6   butalbital -acetaminophen -caffeine  (FIORICET) 50-325-40 MG tablet Take 1-2 tablets by mouth every 6 (six) hours as needed for headache. 20 tablet 0   carvedilol  (COREG ) 3.125 MG tablet TAKE 1 TABLET BY MOUTH TWICE A DAY WITH FOOD 180 tablet 2   cetirizine  (ZYRTEC  ALLERGY ) 10 MG tablet Take 1 tablet (10 mg total) by mouth daily. 30 tablet 5   cholecalciferol (VITAMIN D3) 25 MCG (1000 UNIT) tablet Take 1,000 Units by mouth daily. Isn't taking regularly     clopidogrel  (PLAVIX ) 75 MG tablet Take 1 tablet (75 mg total) by mouth daily with breakfast. 90 tablet 3   denosumab  (PROLIA ) 60 MG/ML SOSY injection Inject 60 mg into the skin every 6 (six) months. Dx code: M81.0.  Pt has appointment on 11/07/23 1 mL 0   diclofenac  Sodium (VOLTAREN ) 1 % GEL Apply 4 g topically 4 (four) times daily as needed (pain). 150 g 0   famotidine  (PEPCID ) 20 MG tablet Take 1 tablet (20 mg total) by mouth 2 (two) times daily. 180 tablet 2   FARXIGA  5 MG TABS tablet TAKE 1 TABLET BY MOUTH EVERY DAY BEFORE BREAKFAST 30 tablet 3   fluticasone  (FLONASE ) 50 MCG/ACT nasal spray SPRAY 2 SPRAYS INTO EACH NOSTRIL EVERY DAY 48 mL 1   furosemide  (LASIX ) 40 MG tablet TAKE 1 TABLET BY MOUTH TWICE A DAY 180 tablet 3   glucose blood (ONETOUCH VERIO) test strip 1 each by Other route 2 (two) times daily. And lancets 2/day 200 each 3   glucose blood test strip Check blood sugar twice a day 100 each 12   hydrALAZINE  (APRESOLINE ) 25 MG tablet Take 3 tablets (75 mg total) by mouth 3 (three) times daily. 810 tablet 3   insulin  lispro (HUMALOG ) 100 UNIT/ML injection Give 3 units with BREAKFAST, AND 18 units with SUPPER 60 mL 1   latanoprost  (XALATAN ) 0.005 % ophthalmic solution Place 1 drop into both eyes at bedtime.     lidocaine  (LIDODERM ) 5 % Place 1 patch onto the skin daily. Remove & Discard patch within 12 hours or as directed by MD 30 patch 0    lubiprostone  (AMITIZA ) 24 MCG capsule Take 1 capsule (24 mcg total) by mouth 2 (two) times daily with a meal. 60 capsule 0   methylPREDNISolone  (MEDROL  DOSEPAK) 4 MG TBPK tablet Take with signs of chronic sinusitis and take as directed 1 each 1   montelukast  (SINGULAIR ) 10 MG tablet Take 1 tablet (10 mg total) by mouth at bedtime. 90 tablet 3   nitroGLYCERIN  (NITROSTAT ) 0.4 MG SL tablet Place 1 tablet (0.4 mg total) under the tongue every 5 (five) minutes x 3 doses as needed for chest pain. 25 tablet 2   ondansetron  (ZOFRAN ) 8 MG tablet TAKE 1 TABLET BY MOUTH EVERY 8 HOURS AS NEEDED FOR NAUSEA OR VOMITING. 18 tablet 1   oxyCODONE  (ROXICODONE ) 5 MG immediate release tablet Take 1 tablet (5 mg total) by mouth every 6 (six) hours as needed for up to 10 doses. 10 tablet  0   pantoprazole  (PROTONIX ) 40 MG tablet Take 1 tablet (40 mg total) by mouth daily. 180 tablet 3   potassium chloride  (KLOR-CON ) 10 MEQ tablet TAKE 1 TABLET BY MOUTH EVERY DAY 90 tablet 2   promethazine -dextromethorphan (PROMETHAZINE -DM) 6.25-15 MG/5ML syrup Take 5 mLs by mouth 4 (four) times daily as needed for cough. 240 mL 0   sodium bicarbonate 650 MG tablet Take 650 mg by mouth 2 (two) times daily.     Syringe/Needle, Disp, (SYRINGE 3CC/25GX1) 25G X 1 3 ML MISC Use to inject into the skin 2x a day 100 each 3   tiZANidine  (ZANAFLEX ) 2 MG tablet Take 1 tablet (2 mg total) by mouth every 8 (eight) hours as needed for muscle spasms. 30 tablet 0   traZODone  (DESYREL ) 50 MG tablet Take 0.5 tablets (25 mg total) by mouth at bedtime as needed for sleep. 15 tablet 0   ULTRAM  50 MG tablet Take 50 mg by mouth every 6 (six) hours as needed.     vitamin B-12 (CYANOCOBALAMIN ) 100 MCG tablet Take 100 mcg by mouth daily.     Current Facility-Administered Medications  Medication Dose Route Frequency Provider Last Rate Last Admin   denosumab  (PROLIA ) injection 60 mg  60 mg Subcutaneous Once Jason Leita Repine, FNP        Allergies:  Aspirin , Ramipril, and Doxycycline   Past Medical History:  Diagnosis Date   Allergic rhinitis    Anterior chest wall pain    Anxiety    Asthma    Chronic diastolic CHF (congestive heart failure) (HCC)    Echo 01/2020: EF 60-65, normal wall motion, mild LVH, normal RV SF, RVSP 52.6 (moderate elevation), severe LAE, moderate RAE, trivial MR, mild MS (mean gradient 5.5 mmHg), mild aortic valve sclerosis (no AS); elevated E/e' c/w elevated LVEDP   Cough    DM type 2 (diabetes mellitus, type 2) (HCC)    GERD (gastroesophageal reflux disease)    History of diverticulitis of colon    HTN (hypertension)    Hyperlipidemia    IBS (irritable bowel syndrome)    Iron deficiency anemia    Mitral stenosis 04/17/2022   Echo 04/2022: EF 55-60, no RWMA, mild LVH, normal RVSF, normal PASP, moderate LAE, moderate mitral stenosis (mean gradient 4 mmHg), trivial MR, trivial AI, AV sclerosis without stenosis   Osteoporosis, unspecified    Renal insufficiency 07/31/2017   UTI (urinary tract infection)     Past Surgical History:  Procedure Laterality Date   CARDIOVASCULAR STRESS TEST  02/25/04   CORONARY BALLOON ANGIOPLASTY N/A 12/28/2021   Procedure: CORONARY BALLOON ANGIOPLASTY;  Surgeon: Verlin Lonni BIRCH, MD;  Location: [facility] INVASIVE CV LAB;  Service: Cardiovascular;  Laterality: N/A;   CORONARY STENT INTERVENTION N/A 12/28/2021   Procedure: CORONARY STENT INTERVENTION;  Surgeon: Verlin Lonni BIRCH, MD;  Location: [facility] CV [facility];  Service: Cardiovascular;  Laterality: N/A;   ESOPHAGOGASTRODUODENOSCOPY  12/26/01   PACEMAKER IMPLANT N/A 08/02/2020   Procedure: PACEMAKER IMPLANT;  Surgeon: Fernande Elspeth BROCKS, MD;  Location: [facility] INVASIVE CV LAB;  Service: Cardiovascular;  Laterality: N/A;   RIGHT OOPHORECTOMY  jan 2010   RIGHT/LEFT HEART CATH AND CORONARY ANGIOGRAPHY N/A 12/28/2021   Procedure: RIGHT/LEFT HEART CATH AND CORONARY ANGIOGRAPHY;  Surgeon: Verlin Lonni BIRCH, MD;  Location: [facility] INVASIVE CV  LAB;  Service: Cardiovascular;  Laterality: N/A;    Family History  Problem Relation Age of Onset   Hypertension Mother    Stroke Mother    Other Father  poor circulation   Cancer Sister    Lung cancer Brother    Melanoma Brother    Diabetes Daughter    Colon cancer Neg Hx    Liver disease Neg Hx    Esophageal cancer Neg Hx     Social History   Tobacco Use   Smoking status: Former    Current packs/day: 0.00    Average packs/day: 0.3 packs/day for 5.0 years (1.5 ttl pk-yrs)    Types: Cigarettes    Start date: 10/02/1981    Quit date: 10/02/1986    Years since quitting: 37.5   Smokeless tobacco: Never  Substance Use Topics   Alcohol use: No    Subjective:   Accompanied by daughter; seen at ER yesterday with concerns for bad headache. Thankfully work up was negative- was told it was a tension headache/ symptoms possibly related to chronic neck needs;       Objective:  Vitals:   04/08/24 1125  BP: 132/68  Pulse: 65  SpO2: 97%  Height: 5' 6 (1.676 m)    General: Well developed, well nourished, in no acute distress  Skin : Warm and dry.  Head: Normocephalic and atraumatic  Eyes: Sclera and conjunctiva clear; pupils round and reactive to light; extraocular movements intact  Ears: External normal; canals clear; tympanic membranes normal  Oropharynx: Pink, supple. No suspicious lesions  Neck: Supple without thyromegaly, adenopathy  Lungs: Respirations unlabored; clear to auscultation bilaterally without wheeze, rales, rhonchi  CVS exam: normal rate and regular rhythm.  Neurologic: Alert and oriented; speech intact; face symmetrical; in wheelchair today;  Assessment:  1. Tension-type headache, not intractable, unspecified chronicity pattern   2. Type 2 diabetes mellitus with chronic kidney disease, with long-term current use of insulin , unspecified CKD stage (HCC)     Plan:  Patient is feeling better today; agree that can use Fioricet as needed; will request  copy of notes from recent ortho visit- suspect neck is contributing to symptoms as well;  Encouraged to schedule a follow up with her endocrinologist; overdue for OV there.   Return in about 4 months (around 08/09/2024) for follow up.  No orders of the defined types were placed in this encounter.   Requested Prescriptions    No prescriptions requested or ordered in this encounter

## 2024-04-08 NOTE — ED Provider Notes (Signed)
 [facility] AT [facility] Provider Note  CSN: [id number] Arrival date & time: 04/07/24 1446  Chief Complaint(s) Headache and Nausea  HPI [name] is a 88 y.o. female with PMH anxiety, CHF, T2DM, GERD, migraines, HTN, osteoporosis who presents emerged part for evaluation of a headache.  States that earlier today she had a headache with a bandlike squeezing sensation that was circumferential associated with a small amount of nausea.  Denies associated numbness, tingling, weakness, vomiting, visual changes or other systemic neurologic complaints.  No head trauma.  Here in the emergency department, she states her headache is actually resolved.  She is primarily concerned that she has intracranial pathology and would like a scan of her head.   Past Medical History Past Medical History:  Diagnosis Date   Allergic rhinitis    Anterior chest wall pain    Anxiety    Asthma    Chronic diastolic CHF (congestive heart failure) (HCC)    Echo 01/2020: EF 60-65, normal wall motion, mild LVH, normal RV SF, RVSP 52.6 (moderate elevation), severe LAE, moderate RAE, trivial MR, mild MS (mean gradient 5.5 mmHg), mild aortic valve sclerosis (no AS); elevated E/e' c/w elevated LVEDP   Cough    DM type 2 (diabetes mellitus, type 2) (HCC)    GERD (gastroesophageal reflux disease)    History of diverticulitis of colon    HTN (hypertension)    Hyperlipidemia    IBS (irritable bowel syndrome)    Iron deficiency anemia    Mitral stenosis 04/17/2022   Echo 04/2022: EF 55-60, no RWMA, mild LVH, normal RVSF, normal PASP, moderate LAE, moderate mitral stenosis (mean gradient 4 mmHg), trivial MR, trivial AI, AV sclerosis without stenosis   Osteoporosis, unspecified    Renal insufficiency 07/31/2017   UTI (urinary tract infection)    Patient Active Problem List   Diagnosis Date Noted   Acute bronchitis 05/03/2023   Acute bacterial rhinosinusitis 05/03/2023   Osteoarthritis of right  knee 07/04/2022   CKD (chronic kidney disease) stage 4, GFR 15-29 ml/min (HCC) 06/27/2022   Mitral stenosis 04/17/2022   Acute kidney injury superimposed on chronic kidney disease (HCC) 01/02/2022   NSTEMI (non-ST elevated myocardial infarction) (HCC) 12/23/2021   Osteoarthritis of knee 03/30/2021   Bilateral hand pain 03/08/2021   Complete heart block (HCC) 12/13/2020   Pacemaker - MDT 12/13/2020   DOE (dyspnea on exertion) 07/29/2020   Encntr for surgical aftcr following surgery on the circ sys 07/26/2020   Hypertensive heart and chronic kidney disease with heart failure and stage 1 through stage 4 chronic kidney disease, or unspecified chronic kidney disease (HCC) 07/26/2020   Pain in right knee 03/17/2020   Chronic combined systolic (congestive) and diastolic (congestive) heart failure (HCC)    DKA (diabetic ketoacidosis) (HCC) 12/20/2019   Diverticulosis 12/10/2019   Age-related osteoporosis without current pathological fracture 10/03/2019   Chronic kidney disease, stage 3 unspecified (HCC) 10/03/2019   Long term (current) use of insulin  (HCC) 10/03/2019   Hyperlipidemia, unspecified 10/03/2019   Long term (current) use of inhaled steroids 10/03/2019   Shortness of breath 01/30/2018   Acute bronchiolitis 12/17/2017   Wheezing 10/29/2017   Vitamin D  deficiency 07/31/2017   Renal insufficiency 07/31/2017   Knee pain 06/01/2017   Morbid obesity due to excess calories (HCC) 01/25/2017   Diabetes (HCC) 04/12/2016   Cough 01/20/2015   Atrial fibrillation (HCC) 05/28/2014   History of colonic polyps 05/28/2014   Vomiting 01/29/2014   CAD (coronary artery  disease) 09/01/2013   Routine general medical examination at a health care facility 07/17/2011   Encounter for long-term (current) use of other medications 07/06/2011   Nonspecific (abnormal) findings on radiological and other examination of body structure 11/09/2009   IRRITABLE BOWEL SYNDROME 05/07/2009   CHEST PAIN 05/07/2009    ADNEXAL MASS, RIGHT 07/22/2008   HEMORRHOIDS, RECURRENT 01/27/2008   Diverticulitis 01/27/2008   OTH ABNORMAL FIND RAD EXAMINATION BREAST 01/27/2008   GERD 01/13/2008   UTI 09/28/2007   Dyslipidemia 05/27/2007   ANEMIA-IRON DEFICIENCY 05/27/2007   ANXIETY 05/27/2007   Essential hypertension 05/27/2007   Seasonal allergic rhinitis 05/27/2007   Cough variant asthma 05/27/2007   Osteoporosis 05/27/2007   DIVERTICULITIS, HX OF 05/27/2007   Home Medication(s) Prior to Admission medications   Medication Sig Start Date End Date Taking? Authorizing Provider  butalbital -acetaminophen -caffeine  (FIORICET) 50-325-40 MG tablet Take 1-2 tablets by mouth every 6 (six) hours as needed for headache. 04/07/24 04/07/25 Yes Keirsten Matuska, MD  acetaminophen  (TYLENOL ) 325 MG tablet Take 650 mg by mouth every 6 (six) hours as needed for pain.    [provider]  albuterol  (PROAIR  HFA) 108 (90 Base) MCG/ACT inhaler 2 puffs every 4 hours as needed only  if your can't catch your breath 12/26/23   Darlean Ozell NOVAK, MD  albuterol  (PROVENTIL ) (2.5 MG/3ML) 0.083% nebulizer solution Take 3 mLs (2.5 mg total) by nebulization every 6 (six) hours as needed for wheezing or shortness of breath. 03/03/24   Jason Leita Repine, FNP  amoxicillin -clavulanate (AUGMENTIN ) 875-125 MG tablet Take 1 tablet by mouth 2 (two) times daily. 03/06/24   Soldatova, Liuba, MD  apixaban  (ELIQUIS ) 2.5 MG TABS tablet TAKE 1 TABLET BY MOUTH TWICE A DAY 01/30/24   Okey Vina GAILS, MD  Ascorbic Acid (VITAMIN C) 1000 MG tablet Take 1,000 mg by mouth daily.    [provider]  atorvastatin  (LIPITOR ) 80 MG tablet TAKE 1 TABLET BY MOUTH EVERY DAY 07/27/23   Ross, Paula V, MD  Azelastine  HCl 137 MCG/SPRAY SOLN PLACE 2 SPRAYS INTO BOTH NOSTRILS 2 (TWO) TIMES DAILY AS NEEDED FOR ALLERGIES. USE IN EACH NOSTRIL AS DIRECTED 03/03/24   Tobie Arleta SQUIBB, MD  BD VEO INSULIN  SYRINGE U/F 31G X 15/64 0.5 ML MISC USE AS INSTRUCTED 04/10/23   Von [place], MD   Blood Glucose Monitoring Suppl [facility] VERIO FLEX SYSTEM) w/Device KIT USE AS DIRECTED 01/16/22   Kassie Mallick, MD  budesonide -formoterol  (SYMBICORT ) 160-4.5 MCG/ACT inhaler TAKE 2 PUFFS FIRST THING IN MORNING AND THEN ANOTHER 2 PUFFS ABOUT 12 HOURS LATER. 11/25/23   Darlean Ozell NOVAK, MD  carvedilol  (COREG ) 3.125 MG tablet TAKE 1 TABLET BY MOUTH TWICE A DAY WITH FOOD 12/04/23   Okey Vina GAILS, MD  cetirizine  (ZYRTEC  ALLERGY ) 10 MG tablet Take 1 tablet (10 mg total) by mouth daily. 09/05/23   Tobie Arleta SQUIBB, MD  cholecalciferol (VITAMIN D3) 25 MCG (1000 UNIT) tablet Take 1,000 Units by mouth daily. Isn't taking regularly    [provider]  clopidogrel  (PLAVIX ) 75 MG tablet Take 1 tablet (75 mg total) by mouth daily with breakfast. 12/17/23   Okey Vina GAILS, MD  denosumab  (PROLIA ) 60 MG/ML SOSY injection Inject 60 mg into the skin every 6 (six) months. Dx code: M81.0.  Pt has appointment on 11/07/23 11/06/23   Jason Leita Repine, FNP  diclofenac  Sodium (VOLTAREN ) 1 % GEL Apply 4 g topically 4 (four) times daily as needed (pain). 12/25/23   Jason Leita Repine, FNP  famotidine  (PEPCID )  20 MG tablet Take 1 tablet (20 mg total) by mouth 2 (two) times daily. 12/03/23   Jason Leita Repine, FNP  FARXIGA  5 MG TABS tablet TAKE 1 TABLET BY MOUTH EVERY DAY BEFORE BREAKFAST 01/29/24   Motwani, Obadiah, MD  fluticasone  (FLONASE ) 50 MCG/ACT nasal spray SPRAY 2 SPRAYS INTO EACH NOSTRIL EVERY DAY 03/03/24   Tobie Arleta SQUIBB, MD  furosemide  (LASIX ) 40 MG tablet TAKE 1 TABLET BY MOUTH TWICE A DAY 09/19/23   Okey Vina GAILS, MD  glucose blood [facility] VERIO) test strip 1 each by Other route 2 (two) times daily. And lancets 2/day 05/09/23   Dartha Obadiah, MD  glucose blood test strip Check blood sugar twice a day 04/11/21   Kassie Mallick, MD  hydrALAZINE  (APRESOLINE ) 25 MG tablet Take 3 tablets (75 mg total) by mouth 3 (three) times daily. 07/30/23   Okey Vina GAILS, MD  insulin  lispro (HUMALOG ) 100 UNIT/ML injection Give 3  units with BREAKFAST, AND 18 units with SUPPER 04/11/23   Motwani, Komal, MD  latanoprost  (XALATAN ) 0.005 % ophthalmic solution Place 1 drop into both eyes at bedtime. 03/16/21   [provider]  lidocaine  (LIDODERM ) 5 % Place 1 patch onto the skin daily. Remove & Discard patch within 12 hours or as directed by MD 02/19/24   Ruthe Cornet, DO  lubiprostone  (AMITIZA ) 24 MCG capsule Take 1 capsule (24 mcg total) by mouth 2 (two) times daily with a meal. 03/21/24 04/20/24  Federico Rosario BROCKS, MD  methylPREDNISolone  (MEDROL  DOSEPAK) 4 MG TBPK tablet Take with signs of chronic sinusitis and take as directed 03/06/24   Soldatova, Liuba, MD  montelukast  (SINGULAIR ) 10 MG tablet Take 1 tablet (10 mg total) by mouth at bedtime. 02/14/24   Darlean Ozell NOVAK, MD  nitroGLYCERIN  (NITROSTAT ) 0.4 MG SL tablet Place 1 tablet (0.4 mg total) under the tongue every 5 (five) minutes x 3 doses as needed for chest pain. 07/24/23   Okey Vina GAILS, MD  ondansetron  (ZOFRAN ) 8 MG tablet TAKE 1 TABLET BY MOUTH EVERY 8 HOURS AS NEEDED FOR NAUSEA OR VOMITING. 03/21/24   Jason Leita Repine, FNP  oxyCODONE  (ROXICODONE ) 5 MG immediate release tablet Take 1 tablet (5 mg total) by mouth every 6 (six) hours as needed for up to 10 doses. 02/19/24   Curatolo, Adam, DO  pantoprazole  (PROTONIX ) 40 MG tablet Take 1 tablet (40 mg total) by mouth daily. 08/08/23   Federico Rosario BROCKS, MD  potassium chloride  (KLOR-CON ) 10 MEQ tablet TAKE 1 TABLET BY MOUTH EVERY DAY 12/04/23   Okey Vina GAILS, MD  promethazine -dextromethorphan (PROMETHAZINE -DM) 6.25-15 MG/5ML syrup Take 5 mLs by mouth 4 (four) times daily as needed for cough. 12/25/23   Jason Leita Repine, FNP  sodium bicarbonate 650 MG tablet Take 650 mg by mouth 2 (two) times daily. 05/19/22   [provider]  Syringe/Needle, Disp, (SYRINGE 3CC/25GX1) 25G X 1 3 ML MISC Use to inject into the skin 2x a day 04/11/21   Kassie Mallick, MD  tiZANidine  (ZANAFLEX ) 2 MG tablet Take 1 tablet (2 mg total)  by mouth every 8 (eight) hours as needed for muscle spasms. 01/29/24   Jason Leita Repine, FNP  traZODone  (DESYREL ) 50 MG tablet Take 0.5 tablets (25 mg total) by mouth at bedtime as needed for sleep. 07/24/23   Okey Vina GAILS, MD  ULTRAM  50 MG tablet Take 50 mg by mouth every 6 (six) hours as needed. 02/27/24   [provider]  vitamin B-12 (CYANOCOBALAMIN ) 100 MCG  tablet Take 100 mcg by mouth daily.    [provider]                                                                                                                                    Past Surgical History Past Surgical History:  Procedure Laterality Date   CARDIOVASCULAR STRESS TEST  02/25/04   CORONARY BALLOON ANGIOPLASTY N/A 12/28/2021   Procedure: CORONARY BALLOON ANGIOPLASTY;  Surgeon: Verlin Lonni BIRCH, MD;  Location: [facility] INVASIVE CV LAB;  Service: Cardiovascular;  Laterality: N/A;   CORONARY STENT INTERVENTION N/A 12/28/2021   Procedure: CORONARY STENT INTERVENTION;  Surgeon: Verlin Lonni BIRCH, MD;  Location: [facility] INVASIVE CV LAB;  Service: Cardiovascular;  Laterality: N/A;   ESOPHAGOGASTRODUODENOSCOPY  12/26/01   PACEMAKER IMPLANT N/A 08/02/2020   Procedure: PACEMAKER IMPLANT;  Surgeon: Fernande Elspeth BROCKS, MD;  [facility];  Service: Cardiovascular;  Laterality: N/A;   RIGHT OOPHORECTOMY  jan 2010   RIGHT/LEFT HEART CATH AND CORONARY ANGIOGRAPHY N/A 12/28/2021   Procedure: RIGHT/LEFT HEART CATH AND CORONARY ANGIOGRAPHY;  Surgeon: Verlin Lonni BIRCH, MD;  Location: [facility] INVASIVE CV LAB;  Service: Cardiovascular;  Laterality: N/A;   Family History Family History  Problem Relation Age of Onset   Hypertension Mother    Stroke Mother    Other Father        poor circulation   Cancer Sister    Lung cancer Brother    Melanoma Brother    Diabetes Daughter    Colon cancer Neg Hx    Liver disease Neg Hx    Esophageal cancer Neg Hx     Social History Social History   Tobacco Use    Smoking status: Former    Current packs/day: 0.00    Average packs/day: 0.3 packs/day for 5.0 years (1.5 ttl pk-yrs)    Types: Cigarettes    Start date: 10/02/1981    Quit date: 10/02/1986    Years since quitting: 37.5   Smokeless tobacco: Never  Vaping Use   Vaping status: Never Used  Substance Use Topics   Alcohol use: No   Drug use: No   Allergies Aspirin , Ramipril, and Doxycycline   Review of Systems Review of Systems  Neurological:  Positive for headaches.    Physical Exam Vital Signs  I have reviewed the triage vital signs BP (!) 153/84   Pulse 62   Temp 98.1 F (36.7 C)   Resp 15   SpO2 99%   Physical Exam Vitals and nursing note reviewed.  Constitutional:      General: She is not in acute distress.    Appearance: She is well-developed.  HENT:     Head: Normocephalic and atraumatic.  Eyes:     Conjunctiva/sclera: Conjunctivae normal.  Cardiovascular:     Rate and Rhythm: Normal rate and regular rhythm.     Heart sounds: No murmur heard. Pulmonary:  Effort: Pulmonary effort is normal. No respiratory distress.     Breath sounds: Normal breath sounds.  Abdominal:     Palpations: Abdomen is soft.     Tenderness: There is no abdominal tenderness.  Musculoskeletal:        General: No swelling.     Cervical back: Neck supple.  Skin:    General: Skin is warm and dry.     Capillary Refill: Capillary refill takes less than 2 seconds.  Neurological:     Mental Status: She is alert.     Cranial Nerves: No cranial nerve deficit.     Sensory: No sensory deficit.     Motor: No weakness.  Psychiatric:        Mood and Affect: Mood normal.     ED Results and Treatments Labs (all labs ordered are listed, but only abnormal results are displayed) Labs Reviewed  COMPREHENSIVE METABOLIC PANEL WITH GFR - Abnormal; Notable for the following components:      Result Value   CO2 21 (*)    Glucose, Bld 337 (*)    BUN 25 (*)    Creatinine, Ser 2.02 (*)    Albumin  3.3 (*)    GFR, Estimated 24 (*)    All other components within normal limits  CBC - Abnormal; Notable for the following components:   Hemoglobin 11.7 (*)    HCT 33.8 (*)    RDW 17.8 (*)    All other components within normal limits                                                                                                                          Radiology CT Head Wo Contrast Result Date: 04/07/2024 CLINICAL DATA:  Headache, sudden, severe EXAM: CT HEAD WITHOUT CONTRAST TECHNIQUE: Contiguous axial images were obtained from the base of the skull through the vertex without intravenous contrast. RADIATION DOSE REDUCTION: This exam was performed according to the departmental dose-optimization program which includes automated exposure control, adjustment of the mA and/or kV according to patient size and/or use of iterative reconstruction technique. COMPARISON:  CT of the head dated August 31, 2023. FINDINGS: Brain: There are encephalomalacia changes again demonstrated at the frontal parietal junctions bilaterally. Chronic encephalomalacia changes are also noted within the left cerebellar hemisphere. There is generalized cerebral volume loss present. There is no evidence of hemorrhage, mass or acute cortical infarction. Vascular: Calcification within the carotid siphons. Skull: Intact and unremarkable. Sinuses/Orbits: Mild mucosal disease within the left sphenoid sinus. Status post bilateral lens replacement. Other: None. IMPRESSION: 1. Chronic encephalomalacia changes at the frontal parietal junctions bilaterally and within the left cerebellar hemisphere. No apparent acute process or significant interval change. Electronically Signed   By: Evalene Coho M.D.   On: 04/07/2024 17:34    Pertinent labs & imaging results that were available during my care of the patient were reviewed by me and considered in my medical decision making (see MDM for details).  Medications  Ordered in ED Medications - No  data to display                                                                                                                                   Procedures Procedures  (including critical care time)  Medical Decision Making / ED Course   This patient presents to the ED for concern of headache, this involves an extensive number of treatment options, and is a complaint that carries with it a high risk of complications and morbidity.  The differential diagnosis includes tension headache, migraine headache, ICH, mass  MDM: Patient seen emerged from for evaluation of the headache.  Physical exam is largely unremarkable with no focal motor or sensory deficits.  No cranial nerve deficits.  Laboratory evaluation with an anemia to 11.7, BUN, 25, creatinine 2.02 which is near patient's baseline.  CT head is unremarkable.  As patient is asymptomatic here in the emergency room, she does not meet inpatient criteria for admission and will be discharged with outpatient follow-up.  Return precautions given of which she voiced understanding.   Additional history obtained: -Additional history obtained from daughter -External records from outside source obtained and reviewed including: Chart review including previous notes, labs, imaging, consultation notes   Lab Tests: -I ordered, reviewed, and interpreted labs.   The pertinent results include:   Labs Reviewed  COMPREHENSIVE METABOLIC PANEL WITH GFR - Abnormal; Notable for the following components:      Result Value   CO2 21 (*)    Glucose, Bld 337 (*)    BUN 25 (*)    Creatinine, Ser 2.02 (*)    Albumin 3.3 (*)    GFR, Estimated 24 (*)    All other components within normal limits  CBC - Abnormal; Notable for the following components:   Hemoglobin 11.7 (*)    HCT 33.8 (*)    RDW 17.8 (*)    All other components within normal limits        Imaging Studies ordered: I ordered imaging studies including CT head I independently visualized and  interpreted imaging. I agree with the radiologist interpretation   Medicines ordered and prescription drug management: Meds ordered this encounter  Medications   DISCONTD: butalbital -acetaminophen -caffeine  (FIORICET) 50-325-40 MG per tablet 1 tablet   DISCONTD: butalbital -acetaminophen -caffeine  (FIORICET) 50-325-40 MG per tablet 1 tablet   butalbital -acetaminophen -caffeine  (FIORICET) 50-325-40 MG tablet    Sig: Take 1-2 tablets by mouth every 6 (six) hours as needed for headache.    Dispense:  20 tablet    Refill:  0    -I have reviewed the patients home medicines and have made adjustments as needed  Critical interventions none   Cardiac Monitoring: The patient was maintained on a cardiac monitor.  I personally viewed and interpreted the cardiac monitored which showed an underlying rhythm of: NSR  Social Determinants of Health:  Factors impacting patients care include: none  Reevaluation: After the interventions noted above, I reevaluated the patient and found that they have :improved  Co morbidities that complicate the patient evaluation  Past Medical History:  Diagnosis Date   Allergic rhinitis    Anterior chest wall pain    Anxiety    Asthma    Chronic diastolic CHF (congestive heart failure) (HCC)    Echo 01/2020: EF 60-65, normal wall motion, mild LVH, normal RV SF, RVSP 52.6 (moderate elevation), severe LAE, moderate RAE, trivial MR, mild MS (mean gradient 5.5 mmHg), mild aortic valve sclerosis (no AS); elevated E/e' c/w elevated LVEDP   Cough    DM type 2 (diabetes mellitus, type 2) (HCC)    GERD (gastroesophageal reflux disease)    History of diverticulitis of colon    HTN (hypertension)    Hyperlipidemia    IBS (irritable bowel syndrome)    Iron deficiency anemia    Mitral stenosis 04/17/2022   Echo 04/2022: EF 55-60, no RWMA, mild LVH, normal RVSF, normal PASP, moderate LAE, moderate mitral stenosis (mean gradient 4 mmHg), trivial MR, trivial AI, AV sclerosis  without stenosis   Osteoporosis, unspecified    Renal insufficiency 07/31/2017   UTI (urinary tract infection)       Dispostion: I considered admission for this patient, but at this time she does not meet inpatient criteria for admission and will be discharged with outpatient follow-up.     Final Clinical Impression(s) / ED Diagnoses Final diagnoses:  Acute nonintractable headache, unspecified headache type     @PCDICTATION @    Albertina Dixon, MD 04/08/24 0126

## 2024-04-11 ENCOUNTER — Other Ambulatory Visit: Payer: Self-pay | Admitting: Physician Assistant

## 2024-04-14 ENCOUNTER — Other Ambulatory Visit: Payer: Self-pay | Admitting: Internal Medicine

## 2024-04-14 ENCOUNTER — Other Ambulatory Visit: Payer: Self-pay

## 2024-04-14 ENCOUNTER — Telehealth: Payer: Self-pay | Admitting: Family

## 2024-04-14 DIAGNOSIS — E1122 Type 2 diabetes mellitus with diabetic chronic kidney disease: Secondary | ICD-10-CM

## 2024-04-14 MED ORDER — SYRINGE 25G X 1" 3 ML MISC: 3 refills | Status: AC

## 2024-04-14 NOTE — Telephone Encounter (Signed)
 Copied from CRM [id number]. Topic: Medicare AWV >> Apr 14, 2024 11:21 AM Nathanel DEL wrote: Reason for CRM: Called LVM 04/14/2024 to schedule AWV. Please schedule Virtual or Telehealth visits ONLY  Nathanel Paschal; Care Guide Ambulatory Clinical Support [place] [facility] Direct Dial: [phone]

## 2024-04-15 ENCOUNTER — Other Ambulatory Visit: Payer: Self-pay | Admitting: Family

## 2024-04-15 DIAGNOSIS — M542 Cervicalgia: Secondary | ICD-10-CM

## 2024-04-18 ENCOUNTER — Other Ambulatory Visit: Payer: Self-pay | Admitting: "Endocrinology

## 2024-04-18 ENCOUNTER — Other Ambulatory Visit: Payer: Self-pay

## 2024-04-18 DIAGNOSIS — Z794 Long term (current) use of insulin: Secondary | ICD-10-CM

## 2024-04-18 DIAGNOSIS — E1122 Type 2 diabetes mellitus with diabetic chronic kidney disease: Secondary | ICD-10-CM

## 2024-04-21 ENCOUNTER — Other Ambulatory Visit: Payer: Self-pay

## 2024-04-21 DIAGNOSIS — N183 Chronic kidney disease, stage 3 unspecified: Secondary | ICD-10-CM

## 2024-04-21 MED ORDER — BD VEO INSULIN SYRINGE U/F 31G X 15/64" 0.5 ML MISC: 3 refills | Status: AC

## 2024-04-21 NOTE — Telephone Encounter (Signed)
 Refill request complete

## 2024-04-23 ENCOUNTER — Other Ambulatory Visit: Payer: Self-pay

## 2024-04-25 ENCOUNTER — Other Ambulatory Visit: Payer: Self-pay

## 2024-04-25 ENCOUNTER — Other Ambulatory Visit: Payer: Self-pay | Admitting: Family

## 2024-04-28 ENCOUNTER — Other Ambulatory Visit: Payer: Self-pay

## 2024-04-28 ENCOUNTER — Ambulatory Visit (INDEPENDENT_AMBULATORY_CARE_PROVIDER_SITE_OTHER): Payer: 59

## 2024-04-28 ENCOUNTER — Encounter: Payer: Self-pay | Admitting: Internal Medicine

## 2024-04-28 ENCOUNTER — Ambulatory Visit (INDEPENDENT_AMBULATORY_CARE_PROVIDER_SITE_OTHER): Admitting: Internal Medicine

## 2024-04-28 VITALS — BP 144/70 | HR 45 | Ht 64.0 in | Wt 166.1 lb

## 2024-04-28 DIAGNOSIS — K5900 Constipation, unspecified: Secondary | ICD-10-CM

## 2024-04-28 DIAGNOSIS — I442 Atrioventricular block, complete: Secondary | ICD-10-CM

## 2024-04-28 DIAGNOSIS — R1032 Left lower quadrant pain: Secondary | ICD-10-CM

## 2024-04-28 DIAGNOSIS — R110 Nausea: Secondary | ICD-10-CM | POA: Diagnosis not present

## 2024-04-28 DIAGNOSIS — R1310 Dysphagia, unspecified: Secondary | ICD-10-CM

## 2024-04-28 DIAGNOSIS — D649 Anemia, unspecified: Secondary | ICD-10-CM | POA: Diagnosis not present

## 2024-04-28 NOTE — Progress Notes (Signed)
 Chief Complaint: Dysphagia, LLQ ab pain  HPI : 88 year old female with history of HFpEF, A-fib on Eliquis , CAD on Plavix , DM, GERD, asthma, IBS, prior diverticulitis presents for follow up of dysphagia and LLQ ab pain  Patient has had dysphagia for years. She states that dysphagia has been present since her EGD procedure in 2009.  Her dysphagia has been stable in severity.  Endorses dysphagia to both solids and liquids.  Dysphagia tends to occur in the bottom of her throat, and sometimes she has to regurgitate food back up after eating it.   Interval History:  She has continued to have pain in the LLQ. Amitiza  8 mcg did not help with constipation. She tried to increase the Amitiza  to 24 mcg but it was causing her to be nauseous. She is taking Miralax twice a week currently. The Miralax seems to be working when she does take it. She has not noticed a significant difference in her LLQ on Miralax twice weekly. Dysphagia is stable. Nausea is stable. She is still using Zofran  PRN. She is off of pantoprazole  as of a few weeks ago and has not noticed a significant difference in her nausea or dysphagia. Denies any acid reflux symptom. Denies blood in the stools.    Past Medical History:  Diagnosis Date   Allergic rhinitis    Anterior chest wall pain    Anxiety    Asthma    Chronic diastolic CHF (congestive heart failure) (HCC)    Echo 01/2020: EF 60-65, normal wall motion, mild LVH, normal RV SF, RVSP 52.6 (moderate elevation), severe LAE, moderate RAE, trivial MR, mild MS (mean gradient 5.5 mmHg), mild aortic valve sclerosis (no AS); elevated E/e' c/w elevated LVEDP   Cough    DM type 2 (diabetes mellitus, type 2) (HCC)    GERD (gastroesophageal reflux disease)    History of diverticulitis of colon    HTN (hypertension)    Hyperlipidemia    IBS (irritable bowel syndrome)    Iron deficiency anemia    Mitral stenosis 04/17/2022   Echo 04/2022: EF 55-60, no RWMA, mild LVH, normal RVSF, normal PASP,  moderate LAE, moderate mitral stenosis (mean gradient 4 mmHg), trivial MR, trivial AI, AV sclerosis without stenosis   Osteoporosis, unspecified    Renal insufficiency 07/31/2017   UTI (urinary tract infection)      Past Surgical History:  Procedure Laterality Date   CARDIOVASCULAR STRESS TEST  02/25/04   CORONARY BALLOON ANGIOPLASTY N/A 12/28/2021   Procedure: CORONARY BALLOON ANGIOPLASTY;  Surgeon: Verlin Lonni BIRCH, MD;  Location: [facility];  Service: Cardiovascular;  Laterality: N/A;   CORONARY STENT INTERVENTION N/A 12/28/2021   Procedure: CORONARY STENT INTERVENTION;  Surgeon: Verlin Lonni BIRCH, MD;  Location: [facility];  Service: Cardiovascular;  Laterality: N/A;   ESOPHAGOGASTRODUODENOSCOPY  12/26/01   PACEMAKER IMPLANT N/A 08/02/2020   Procedure: PACEMAKER IMPLANT;  Surgeon: Fernande Elspeth BROCKS, MD;  Location: [facility];  Service: Cardiovascular;  Laterality: N/A;   RIGHT OOPHORECTOMY  jan 2010   RIGHT/LEFT HEART CATH AND CORONARY ANGIOGRAPHY N/A 12/28/2021   Procedure: RIGHT/LEFT HEART CATH AND CORONARY ANGIOGRAPHY;  Surgeon: Verlin Lonni BIRCH, MD;  Location: [facility];  Service: Cardiovascular;  Laterality: N/A;   Family History  Problem Relation Age of Onset   Hypertension Mother    Stroke Mother    Other Father        poor circulation   Cancer Sister  Lung cancer Brother    Melanoma Brother    Diabetes Daughter    Colon cancer Neg Hx    Liver disease Neg Hx    Esophageal cancer Neg Hx    Social History   Tobacco Use   Smoking status: Former    Current packs/day: 0.00    Average packs/day: 0.3 packs/day for 5.0 years (1.5 ttl pk-yrs)    Types: Cigarettes    Start date: 10/02/1981    Quit date: 10/02/1986    Years since quitting: 37.5   Smokeless tobacco: Never  Vaping Use   Vaping status: Never Used  Substance Use Topics   Alcohol use: No   Drug use: No   Current Outpatient Medications  Medication Sig Dispense  Refill   acetaminophen  (TYLENOL ) 325 MG tablet Take 650 mg by mouth every 6 (six) hours as needed for pain.     albuterol  (PROAIR  HFA) 108 (90 Base) MCG/ACT inhaler 2 puffs every 4 hours as needed only  if your can't catch your breath 18 g 11   albuterol  (PROVENTIL ) (2.5 MG/3ML) 0.083% nebulizer solution Take 3 mLs (2.5 mg total) by nebulization every 6 (six) hours as needed for wheezing or shortness of breath. 150 mL 1   apixaban  (ELIQUIS ) 2.5 MG TABS tablet TAKE 1 TABLET BY MOUTH TWICE A DAY 60 tablet 6   Ascorbic Acid (VITAMIN C) 1000 MG tablet Take 1,000 mg by mouth daily.     atorvastatin  (LIPITOR ) 80 MG tablet TAKE 1 TABLET BY MOUTH EVERY DAY 90 tablet 3   Azelastine  HCl 137 MCG/SPRAY SOLN PLACE 2 SPRAYS INTO BOTH NOSTRILS 2 (TWO) TIMES DAILY AS NEEDED FOR ALLERGIES. USE IN EACH NOSTRIL AS DIRECTED 90 mL 0   BD VEO INSULIN  SYRINGE U/F 31G X 15/64 0.5 ML MISC Use as instructed 100 each 3   Blood Glucose Monitoring Suppl (ONETOUCH VERIO FLEX SYSTEM) w/Device KIT USE AS DIRECTED 1 kit .   budesonide -formoterol  (SYMBICORT ) 160-4.5 MCG/ACT inhaler TAKE 2 PUFFS FIRST THING IN MORNING AND THEN ANOTHER 2 PUFFS ABOUT 12 HOURS LATER. 30.6 each 6   butalbital -acetaminophen -caffeine  (FIORICET) 50-325-40 MG tablet Take 1-2 tablets by mouth every 6 (six) hours as needed for headache. 20 tablet 0   carvedilol  (COREG ) 3.125 MG tablet TAKE 1 TABLET BY MOUTH TWICE A DAY WITH FOOD 180 tablet 2   cetirizine  (ZYRTEC  ALLERGY ) 10 MG tablet Take 1 tablet (10 mg total) by mouth daily. 30 tablet 5   cholecalciferol (VITAMIN D3) 25 MCG (1000 UNIT) tablet Take 1,000 Units by mouth daily. Isn't taking regularly     clopidogrel  (PLAVIX ) 75 MG tablet Take 1 tablet (75 mg total) by mouth daily with breakfast. 90 tablet 3   denosumab  (PROLIA ) 60 MG/ML SOSY injection Inject 60 mg into the skin every 6 (six) months. Dx code: M81.0.  Pt has appointment on 11/07/23 1 mL 0   diclofenac  Sodium (VOLTAREN ) 1 % GEL Apply 4 g topically  4 (four) times daily as needed (pain). 150 g 0   famotidine  (PEPCID ) 20 MG tablet Take 1 tablet (20 mg total) by mouth 2 (two) times daily. 180 tablet 2   FARXIGA  5 MG TABS tablet TAKE 1 TABLET BY MOUTH EVERY DAY BEFORE BREAKFAST 30 tablet 3   fluticasone  (FLONASE ) 50 MCG/ACT nasal spray SPRAY 2 SPRAYS INTO EACH NOSTRIL EVERY DAY 48 mL 1   furosemide  (LASIX ) 40 MG tablet TAKE 1 TABLET BY MOUTH TWICE A DAY 180 tablet 3   glucose blood (ONETOUCH VERIO)  test strip 1 each by Other route 2 (two) times daily. And lancets 2/day 200 each 3   glucose blood test strip Check blood sugar twice a day 100 each 12   hydrALAZINE  (APRESOLINE ) 25 MG tablet Take 3 tablets (75 mg total) by mouth 3 (three) times daily. 810 tablet 3   insulin  lispro (HUMALOG ) 100 UNIT/ML injection GIVE 3 UNITS WITH BREAKFAST, AND 18 UNITS WITH SUPPER 10 mL 11   latanoprost  (XALATAN ) 0.005 % ophthalmic solution Place 1 drop into both eyes at bedtime.     lidocaine  (LIDODERM ) 5 % Place 1 patch onto the skin daily. Remove & Discard patch within 12 hours or as directed by MD 30 patch 0   montelukast  (SINGULAIR ) 10 MG tablet Take 1 tablet (10 mg total) by mouth at bedtime. 90 tablet 3   nitroGLYCERIN  (NITROSTAT ) 0.4 MG SL tablet Place 1 tablet (0.4 mg total) under the tongue every 5 (five) minutes x 3 doses as needed for chest pain. 25 tablet 2   ondansetron  (ZOFRAN ) 8 MG tablet TAKE 1 TABLET BY MOUTH EVERY 8 HOURS AS NEEDED FOR NAUSEA OR VOMITING. 18 tablet 1   oxyCODONE  (ROXICODONE ) 5 MG immediate release tablet Take 1 tablet (5 mg total) by mouth every 6 (six) hours as needed for up to 10 doses. 10 tablet 0   potassium chloride  (KLOR-CON ) 10 MEQ tablet TAKE 1 TABLET BY MOUTH EVERY DAY 90 tablet 2   promethazine -dextromethorphan (PROMETHAZINE -DM) 6.25-15 MG/5ML syrup Take 5 mLs by mouth 4 (four) times daily as needed for cough. 240 mL 0   sodium bicarbonate 650 MG tablet Take 650 mg by mouth 2 (two) times daily.     Syringe/Needle, Disp,  (SYRINGE 3CC/25GX1) 25G X 1 3 ML MISC Use to inject into the skin 2x a day 100 each 3   tiZANidine  (ZANAFLEX ) 2 MG tablet Take 1 tablet (2 mg total) by mouth every 8 (eight) hours as needed for muscle spasms. 30 tablet 0   traZODone  (DESYREL ) 50 MG tablet Take 0.5 tablets (25 mg total) by mouth at bedtime as needed for sleep. 15 tablet 0   ULTRAM  50 MG tablet Take 50 mg by mouth every 6 (six) hours as needed.     vitamin B-12 (CYANOCOBALAMIN ) 100 MCG tablet Take 100 mcg by mouth daily.     amoxicillin -clavulanate (AUGMENTIN ) 875-125 MG tablet Take 1 tablet by mouth 2 (two) times daily. (Patient not taking: Reported on 04/28/2024) 20 tablet 0   lubiprostone  (AMITIZA ) 24 MCG capsule TAKE 1 CAPSULE (24 MCG TOTAL) BY MOUTH 2 (TWO) TIMES DAILY WITH A MEAL. (Patient not taking: Reported on 04/28/2024) 180 capsule 1   methylPREDNISolone  (MEDROL  DOSEPAK) 4 MG TBPK tablet Take with signs of chronic sinusitis and take as directed (Patient not taking: Reported on 04/28/2024) 1 each 1   pantoprazole  (PROTONIX ) 40 MG tablet Take 1 tablet (40 mg total) by mouth daily. (Patient not taking: Reported on 04/28/2024) 180 tablet 3   Current Facility-Administered Medications  Medication Dose Route Frequency Provider Last Rate Last Admin   denosumab  (PROLIA ) injection 60 mg  60 mg Subcutaneous Once Jason Leita Repine, FNP       Allergies  Allergen Reactions   Aspirin  Shortness Of Breath and Other (See Comments)    Caused asthma symptoms   Ramipril Cough   Doxycycline      Nausea     Physical Exam: BP (!) 144/70 (BP Location: Left Arm, Patient Position: Sitting, Cuff Size: Normal)   Pulse (!) 45  Ht 5' 4 (1.626 m)   Wt 166 lb 2 oz (75.4 kg)   BMI 28.52 kg/m  Constitutional: Pleasant,well-developed, female in no acute distress. HEENT: Normocephalic and atraumatic. No oral thrush visualized Cardiovascular: Normal rate, regular rhythm.  Pulmonary/chest: Effort normal and breath sounds normal. No wheezing,  rales or rhonchi. Abdominal: Soft, nondistended, tender in the left lower quadrant Extremities: No edema Neurological: Alert and oriented to person place and time. Skin: Skin is warm and dry. No rashes noted. Psychiatric: Normal mood and affect. Behavior is normal.  Labs 04/2023: CMP with nml LFTs, low Na of 133, elevated Cr of 2.74, and glucose of 471.   Labs 05/2023: CBC with low plts of 110. BMP with elevated Cr of 2.82 and mildly low Na of 134. BNP mildly elevated at 214. TSH nml. ESR nml.   Labs 08/2023: CBC with low plts of 139. CMP with elevated Cr of 2.61 and elevated glucose.  Labs 01/2024: CBC with elevated WBC of 13.8. BMP with elevated Cr of 2.28  Gastric emptying study 02/20/14: IMPRESSION:  Normal exam.   HIDA scan 12/25/21: IMPRESSION: Normal hepatobiliary scan, demonstrating patency of both the cystic and common bile ducts.  CT A/P w/o contrast 07/19/22: IMPRESSION: 1. No acute abdominopelvic findings. 2. Colonic diverticulosis without acute diverticulitis. 3. Cholelithiasis without evidence of acute cholecystitis. 4. Punctate nonobstructing left lower pole renal calculus. 5. Small hiatal hernia. 6. Multichamber cardiomegaly with coronary artery calcifications and aortic valvular and mitral annular calcifications. 7. Similar bibasilar linear atelectasis/scarring with subsegmental mucous plugging and mild bronchiectasis left lower lobe. 8. Aortic Atherosclerosis (ICD10-I70.0).  Chest CT w/o contrast 04/27/23: IMPRESSION: 1. Bronchial wall thickening and mucous plugging in the lower lobes greatest in the left lower lobe. 2. Small hiatal hernia with mild wall thickening of the lower esophagus suggesting esophagitis. 3. Cholelithiasis.  Barium swallow 06/22/23: IMPRESSION: 1. Presbyesophagus with tertiary contractions and corkscrew appearance in the mid and distal esophagus. The contractions and intermittent spasms limit our ability to assess for  esophageal narrowings. 2. Initially substantially delayed emptying of the esophagus into the stomach in the upright position. Suspected component of esophageal spasm. Subsequently the esophagus did spontaneously empty into the stomach in the upright position. 3. Small type 1 hiatal hernia. 4. 13 mm barium tablet impacted just above the hiatal hernia.  CT A/P w/o contrast 11/29/23: IMPRESSION: 1. Cholelithiasis. 2. Hepatic cysts. 3. Left vulvar lesion, likely a cyst. This could be assessed further with ultrasound. 4. Aortic atherosclerosis.  EGD 04/02/08:   Colonoscopy 12/18/19: Excellent prep.    ASSESSMENT AND PLAN: Chronic LLQ ab pain Nausea Constipation Gallstones Dysphagia (present for years) Abnormal barium swallow with hiatal hernia Mild anemia Patient has continued to have LLQ ab pain that has been persistent. She was not able to tolerate Amitiza  (the higher dose caused her to become nauseous and the lower dose was ineffective). Thus I asked her to try to take the Miralax more frequently in order to try to help with any underlying constipation issues. Her LLQ ab pain may be due to constipation. Will repeat a CT scan w/o contrast (due to CKD) to see if she may have developed any signs of diverticulitis. If the increased dosage of the Miralax does not help with her symptoms and the CT scan is unrevealing, then can consider an unsedated flex sig in the future. Dysphagia is still present but stable. - Previously declined EGD - Continue Zofran  PRN - Okay to stop pantoprazole  - Aim to increase water, fiber,  and protein intake - Take Miralax every day - Can plan to repeat CT A/P w/o contrast - Declined flexible sigmoidoscopy for now - Patient will need a repeat CBC checked in the future due to some mild anemia - RTC 2 months  Estefana Kidney, MD  I spent 33 minutes of time, including in depth chart review, independent review of results as outlined above, communicating results  with the patient directly, face-to-face time with the patient, coordinating care, ordering studies and medications as appropriate, and documentation.

## 2024-04-28 NOTE — Patient Instructions (Signed)
 You have been scheduled for a CT scan of the abdomen and pelvis at [facility], 1st floor Radiology. You are scheduled on 05/02/24 at 7 am. You should arrive 15 minutes prior to your appointment time for registration.  You may take any medications as prescribed with a small amount of water, if necessary. If you take any of the following medications: METFORMIN, GLUCOPHAGE, GLUCOVANCE, AVANDAMET, RIOMET, FORTAMET, ACTOPLUS MET, JANUMET, GLUMETZA or METAGLIP, you MAY be asked to HOLD this medication 48 hours AFTER the exam.   The purpose of you drinking the oral contrast is to aid in the visualization of your intestinal tract. The contrast solution may cause some diarrhea. Depending on your individual set of symptoms, you may also receive an intravenous injection of x-ray contrast/dye. Plan on being at [facility] for 45 minutes or longer, depending on the type of exam you are having performed.   If you have any questions regarding your exam or if you need to reschedule, you may call [facility] at [phone] between the hours of 8:00 am and 5:00 pm, Monday-Friday.   Start taking Miralax daily  Increase your water intake and fiber  If your blood pressure at your visit was 140/90 or greater, please contact your primary care physician to follow up on this.  _______________________________________________________  If you are age 30 or older, your body mass index should be between 23-30. Your Body mass index is 28.52 kg/m. If this is out of the aforementioned range listed, please consider follow up with your Primary Care Provider.  If you are age 74 or younger, your body mass index should be between 19-25. Your Body mass index is 28.52 kg/m. If this is out of the aformentioned range listed, please consider follow up with your Primary Care Provider.   ________________________________________________________  The [facility] providers would like to encourage you to use MYCHART to  communicate with providers for non-urgent requests or questions.  Due to long hold times on the telephone, sending your provider a message by [facility] may be a faster and more efficient way to get a response.  Please allow 48 business hours for a response.  Please remember that this is for non-urgent requests.  _______________________________________________________  [facility] is using a team-based approach to care.  Your team is made up of your doctor and two to three APPS. Our APPS (Nurse Practitioners and Physician Assistants) work with your physician to ensure care continuity for you. They are fully qualified to address your health concerns and develop a treatment plan. They communicate directly with your gastroenterologist to care for you. Seeing the Advanced Practice Practitioners on your physician's team can help you by facilitating care more promptly, often allowing for earlier appointments, access to diagnostic testing, procedures, and other specialty referrals.   Thank you for entrusting me with your care and for choosing [facility],  Dr. Estefana Kidney

## 2024-04-29 ENCOUNTER — Other Ambulatory Visit: Payer: Self-pay | Admitting: Internal Medicine

## 2024-04-29 ENCOUNTER — Other Ambulatory Visit: Payer: Self-pay | Admitting: "Endocrinology

## 2024-04-29 ENCOUNTER — Other Ambulatory Visit (HOSPITAL_COMMUNITY): Payer: Self-pay

## 2024-04-29 DIAGNOSIS — N183 Chronic kidney disease, stage 3 unspecified: Secondary | ICD-10-CM

## 2024-04-29 LAB — CUP PACEART REMOTE DEVICE CHECK
Battery Remaining Longevity: 118 mo
Battery Voltage: 3.01 V
Brady Statistic RV Percent Paced: 99.76 %
Date Time Interrogation Session: 20250727201439
Implantable Lead Connection Status: 753985
Implantable Lead Implant Date: 20211101
Implantable Lead Location: 753860
Implantable Lead Model: 3830
Implantable Pulse Generator Implant Date: 20211101
Lead Channel Impedance Value: 380 Ohm
Lead Channel Impedance Value: 456 Ohm
Lead Channel Pacing Threshold Amplitude: 0.75 V
Lead Channel Pacing Threshold Pulse Width: 0.4 ms
Lead Channel Sensing Intrinsic Amplitude: 7.5 mV
Lead Channel Sensing Intrinsic Amplitude: 7.5 mV
Lead Channel Setting Pacing Amplitude: 2 V
Lead Channel Setting Pacing Pulse Width: 0.4 ms
Lead Channel Setting Sensing Sensitivity: 0.9 mV
Zone Setting Status: 755011

## 2024-04-29 NOTE — Progress Notes (Signed)
 Patient has a $0 out of pocket cost if medication is filled through medical benefits. See Specialty [facility] encounter from 04/07/24. Dis-enrolling.

## 2024-04-30 ENCOUNTER — Ambulatory Visit: Payer: Self-pay | Admitting: Cardiology

## 2024-05-01 DIAGNOSIS — M1711 Unilateral primary osteoarthritis, right knee: Secondary | ICD-10-CM | POA: Diagnosis not present

## 2024-05-01 NOTE — Telephone Encounter (Signed)
 Rx Refill

## 2024-05-02 ENCOUNTER — Ambulatory Visit (HOSPITAL_COMMUNITY)
Admission: RE | Admit: 2024-05-02 | Discharge: 2024-05-02 | Disposition: A | Discharge status: Home / Self Care | Source: Ambulatory Visit | Attending: Internal Medicine | Admitting: Internal Medicine

## 2024-05-02 DIAGNOSIS — R1310 Dysphagia, unspecified: Secondary | ICD-10-CM | POA: Diagnosis not present

## 2024-05-02 DIAGNOSIS — R1032 Left lower quadrant pain: Secondary | ICD-10-CM | POA: Insufficient documentation

## 2024-05-02 DIAGNOSIS — K5730 Diverticulosis of large intestine without perforation or abscess without bleeding: Secondary | ICD-10-CM | POA: Diagnosis not present

## 2024-05-02 DIAGNOSIS — K449 Diaphragmatic hernia without obstruction or gangrene: Secondary | ICD-10-CM | POA: Diagnosis not present

## 2024-05-02 DIAGNOSIS — R110 Nausea: Secondary | ICD-10-CM | POA: Diagnosis not present

## 2024-05-02 DIAGNOSIS — K5900 Constipation, unspecified: Secondary | ICD-10-CM | POA: Insufficient documentation

## 2024-05-02 DIAGNOSIS — K8020 Calculus of gallbladder without cholecystitis without obstruction: Secondary | ICD-10-CM | POA: Diagnosis not present

## 2024-05-04 ENCOUNTER — Other Ambulatory Visit: Payer: Self-pay | Admitting: Internal Medicine

## 2024-05-05 NOTE — Telephone Encounter (Signed)
 Primary care should fill this prescription

## 2024-05-06 ENCOUNTER — Ambulatory Visit (HOSPITAL_COMMUNITY)
Admission: RE | Admit: 2024-05-06 | Discharge: 2024-05-06 | Disposition: A | Discharge status: Home / Self Care | Source: Ambulatory Visit | Attending: Cardiology | Admitting: Cardiology

## 2024-05-06 DIAGNOSIS — I050 Rheumatic mitral stenosis: Secondary | ICD-10-CM | POA: Insufficient documentation

## 2024-05-06 DIAGNOSIS — I342 Nonrheumatic mitral (valve) stenosis: Secondary | ICD-10-CM

## 2024-05-06 LAB — ECHOCARDIOGRAM COMPLETE
AR max vel: 1.87 cm2
AV Area VTI: 1.8 cm2
AV Area mean vel: 1.63 cm2
AV Mean grad: 2.6 mmHg
AV Peak grad: 4.5 mmHg
Ao pk vel: 1.06 m/s
Area-P 1/2: 1.13 cm2
Est EF: 55
MV VTI: 0.71 cm2
S' Lateral: 3.4 cm

## 2024-05-06 NOTE — Telephone Encounter (Signed)
 [name] FNP should be the one prescribing

## 2024-05-07 ENCOUNTER — Encounter (HOSPITAL_BASED_OUTPATIENT_CLINIC_OR_DEPARTMENT_OTHER): Payer: Self-pay | Admitting: Emergency Medicine

## 2024-05-07 ENCOUNTER — Ambulatory Visit: Payer: Self-pay | Admitting: Physician Assistant

## 2024-05-07 ENCOUNTER — Emergency Department (HOSPITAL_BASED_OUTPATIENT_CLINIC_OR_DEPARTMENT_OTHER)
Admission: EM | Admit: 2024-05-07 | Discharge: 2024-05-07 | Disposition: A | Discharge status: Home / Self Care | Attending: Emergency Medicine | Admitting: Emergency Medicine

## 2024-05-07 ENCOUNTER — Other Ambulatory Visit: Payer: Self-pay

## 2024-05-07 ENCOUNTER — Ambulatory Visit: Payer: Self-pay | Admitting: Internal Medicine

## 2024-05-07 ENCOUNTER — Emergency Department (HOSPITAL_BASED_OUTPATIENT_CLINIC_OR_DEPARTMENT_OTHER)

## 2024-05-07 ENCOUNTER — Ambulatory Visit: Payer: Self-pay | Admitting: *Deleted

## 2024-05-07 DIAGNOSIS — Z950 Presence of cardiac pacemaker: Secondary | ICD-10-CM | POA: Insufficient documentation

## 2024-05-07 DIAGNOSIS — I110 Hypertensive heart disease with heart failure: Secondary | ICD-10-CM | POA: Diagnosis not present

## 2024-05-07 DIAGNOSIS — K2970 Gastritis, unspecified, without bleeding: Secondary | ICD-10-CM | POA: Insufficient documentation

## 2024-05-07 DIAGNOSIS — I4891 Unspecified atrial fibrillation: Secondary | ICD-10-CM | POA: Insufficient documentation

## 2024-05-07 DIAGNOSIS — J45909 Unspecified asthma, uncomplicated: Secondary | ICD-10-CM | POA: Diagnosis not present

## 2024-05-07 DIAGNOSIS — R0600 Dyspnea, unspecified: Secondary | ICD-10-CM | POA: Diagnosis not present

## 2024-05-07 DIAGNOSIS — Z794 Long term (current) use of insulin: Secondary | ICD-10-CM | POA: Diagnosis not present

## 2024-05-07 DIAGNOSIS — Z7901 Long term (current) use of anticoagulants: Secondary | ICD-10-CM | POA: Insufficient documentation

## 2024-05-07 DIAGNOSIS — R918 Other nonspecific abnormal finding of lung field: Secondary | ICD-10-CM | POA: Diagnosis not present

## 2024-05-07 DIAGNOSIS — I509 Heart failure, unspecified: Secondary | ICD-10-CM | POA: Insufficient documentation

## 2024-05-07 DIAGNOSIS — R0789 Other chest pain: Secondary | ICD-10-CM | POA: Diagnosis not present

## 2024-05-07 DIAGNOSIS — R0989 Other specified symptoms and signs involving the circulatory and respiratory systems: Secondary | ICD-10-CM | POA: Diagnosis not present

## 2024-05-07 LAB — CBC
HCT: 35.9 % — ABNORMAL LOW (ref 36.0–46.0)
Hemoglobin: 12.4 g/dL (ref 12.0–15.0)
MCH: 28 pg (ref 26.0–34.0)
MCHC: 34.5 g/dL (ref 30.0–36.0)
MCV: 81 fL (ref 80.0–100.0)
Platelets: 154 K/uL (ref 150–400)
RBC: 4.43 MIL/uL (ref 3.87–5.11)
RDW: 18.4 % — ABNORMAL HIGH (ref 11.5–15.5)
WBC: 9.1 K/uL (ref 4.0–10.5)
nRBC: 0.2 % (ref 0.0–0.2)

## 2024-05-07 LAB — BASIC METABOLIC PANEL WITH GFR
Anion gap: 14 (ref 5–15)
BUN: 34 mg/dL — ABNORMAL HIGH (ref 8–23)
CO2: 22 mmol/L (ref 22–32)
Calcium: 9.6 mg/dL (ref 8.9–10.3)
Chloride: 100 mmol/L (ref 98–111)
Creatinine, Ser: 2.07 mg/dL — ABNORMAL HIGH (ref 0.44–1.00)
GFR, Estimated: 23 mL/min — ABNORMAL LOW (ref >60)
Glucose, Bld: 296 mg/dL — ABNORMAL HIGH (ref 70–99)
Potassium: 4.7 mmol/L (ref 3.5–5.1)
Sodium: 136 mmol/L (ref 135–145)

## 2024-05-07 LAB — TROPONIN T, HIGH SENSITIVITY
Troponin T High Sensitivity: 38 ng/L — ABNORMAL HIGH (ref <19)
Troponin T High Sensitivity: 36 ng/L — ABNORMAL HIGH (ref <19)

## 2024-05-07 LAB — PRO BRAIN NATRIURETIC PEPTIDE: Pro Brain Natriuretic Peptide: 3594 pg/mL — ABNORMAL HIGH (ref <300.0)

## 2024-05-07 MED ORDER — ALUM & MAG HYDROXIDE-SIMETH 200-200-20 MG/5ML PO SUSP
30.0000 mL | Freq: Once | ORAL | Status: AC
  Administered 2024-05-07: 30 mL via ORAL
  Filled 2024-05-07: qty 30

## 2024-05-07 MED ORDER — SUCRALFATE 1 G PO TABS
1.0000 g | ORAL_TABLET | Freq: Three times a day (TID) | ORAL | 0 refills | Status: AC | PRN
Start: 2024-05-07 — End: ?

## 2024-05-07 MED ORDER — SUCRALFATE 1 G PO TABS
1.0000 g | ORAL_TABLET | Freq: Once | ORAL | Status: AC
  Administered 2024-05-07: 1 g via ORAL
  Filled 2024-05-07: qty 1

## 2024-05-07 NOTE — ED Notes (Signed)
 ED Provider at bedside.

## 2024-05-07 NOTE — Telephone Encounter (Signed)
 FYI Only or Action Required?: FYI only for provider.  Patient was last seen in primary care on 04/08/2024 by Jason Leita Repine, FNP.  Called Nurse Triage reporting Shortness of Breath.  Symptoms began a week ago.  Interventions attempted: OTC medications: nebulizer- today- has not used rescue inhaler today.  Symptoms are: unchanged.  Triage Disposition: See HCP Within 4 Hours (Or PCP Triage)  Patient/caregiver understands and will follow disposition?: Patient will use her inhaler- if does not help- she needs to be seen at [facility]. Daughter understands. FYI Only or Action Required?: FYI only for provider.       Reason for Disposition  [1] Longstanding difficulty breathing AND [2] not responding to usual therapy  Answer Assessment - Initial Assessment Questions 1. RESPIRATORY STATUS: Describe your breathing? (e.g., wheezing, shortness of breath, unable to speak, severe coughing)      Coughing present 2. ONSET: When did this breathing problem begin?      Started last week 3. PATTERN Does the difficult breathing come and go, or has it been constant since it started?      Comes and goes 4. SEVERITY: How bad is your breathing? (e.g., mild, moderate, severe)      Worse when moving around- using nebulizer- first day using- patient feels it is not helping 5. RECURRENT SYMPTOM: Have you had difficulty breathing before? If Yes, ask: When was the last time? and What happened that time?      Yes- prednisone , nebulizer 3xday- yes 6. CARDIAC HISTORY: Do you have any history of heart disease? (e.g., heart attack, angina, bypass surgery, angioplasty)      Hx heart problem 7. LUNG HISTORY: Do you have any history of lung disease?  (e.g., pulmonary embolus, asthma, emphysema)     Acute bronchitis 8. CAUSE: What do you think is causing the breathing problem?      Unsure- increased cough, tickle in throat 9. OTHER SYMPTOMS: Do you have any other symptoms? (e.g., chest pain,  cough, dizziness, fever, runny nose)     cough 10. O2 SATURATION MONITOR:  Do you use an oxygen saturation monitor (pulse oximeter) at home? If Yes, ask: What is your reading (oxygen level) today? What is your usual oxygen saturation reading? (e.g., 95%)       no  Protocols used: Breathing Difficulty-A-AH   Copied from CRM #[id number]. Topic: Clinical - Red Word Triage >> May 07, 2024  4:11 PM Elle L wrote: Red Word that prompted transfer to Nurse Triage: The patient's daughter, Rosaline, states that the patient is experiencing shortness of breath and the Pulmonologist is unable to see her for a month.

## 2024-05-07 NOTE — ED Triage Notes (Signed)
 SOB and chest tightness. Headache. Starting last week progressively getting worse. Reports no swelling. Inhaler 'a few times a day' no relief. Extensive HX including pacemaker, NSTEMI 23, diabetic with insulin  use, afib-eliquis .

## 2024-05-07 NOTE — ED Provider Notes (Signed)
 [place] EMERGENCY DEPARTMENT AT [facility] Provider Note   CSN: [id number] Arrival date & time: 05/07/24  1739     Patient presents with: No chief complaint on file.   [name] is a 88 y.o. female w/ hx of NSTEMI in 2023 with DES x1 to prox LAD, pacemaker, A Fib on eliquis  and coreg , HLD, mitral valve stenosis, CHF, HTN, here with concern for chest discomfort and SOB.  Chest discomfort x 1 week, not persistent, and is focally epigastric, like pressure.  SOB x 2-3 days, noting dyspnea on exertion.  No persistent coughing.  Hx of asthma (nonsmoker) but symptoms not improving with duonebs at home.  Currently has mild to moderate chest discomfort.  Denies leg swelling or fevers.  Cardiology office notes reviewed 02/27/24 Lum Rana Nicolas NP, noting medical history as above.    Echocardiogram done yesterday shows EF 55%, moderate mitral stenosis, moderate aortic calcifications, several dilated left atrium and moderately dilated right atrium.   HPI     Prior to Admission medications   Medication Sig Start Date End Date Taking? Authorizing Provider  sucralfate  (CARAFATE ) 1 g tablet Take 1 tablet (1 g total) by mouth 3 (three) times daily as needed for up to 60 doses. 05/07/24  Yes Cottie Donnice PARAS, MD  acetaminophen  (TYLENOL ) 325 MG tablet Take 650 mg by mouth every 6 (six) hours as needed for pain.    [provider]  albuterol  (PROAIR  HFA) 108 (90 Base) MCG/ACT inhaler 2 puffs every 4 hours as needed only  if your can't catch your breath 12/26/23   Darlean Ozell NOVAK, MD  albuterol  (PROVENTIL ) (2.5 MG/3ML) 0.083% nebulizer solution Take 3 mLs (2.5 mg total) by nebulization every 6 (six) hours as needed for wheezing or shortness of breath. 03/03/24   Jason Leita Repine, FNP  amoxicillin -clavulanate (AUGMENTIN ) 875-125 MG tablet Take 1 tablet by mouth 2 (two) times daily. Patient not taking: Reported on 04/28/2024 03/06/24   Soldatova, Liuba, MD  apixaban  (ELIQUIS )  2.5 MG TABS tablet TAKE 1 TABLET BY MOUTH TWICE A DAY 01/30/24   Okey Vina GAILS, MD  Ascorbic Acid (VITAMIN C) 1000 MG tablet Take 1,000 mg by mouth daily.    [provider]  atorvastatin  (LIPITOR ) 80 MG tablet TAKE 1 TABLET BY MOUTH EVERY DAY 05/01/24   Ross, Paula V, MD  Azelastine  HCl 137 MCG/SPRAY SOLN PLACE 2 SPRAYS INTO BOTH NOSTRILS 2 (TWO) TIMES DAILY AS NEEDED FOR ALLERGIES. USE IN EACH NOSTRIL AS DIRECTED 04/29/24   Tobie Arleta SQUIBB, MD  BD VEO INSULIN  SYRINGE U/F 31G X 15/64 0.5 ML MISC Use as instructed 04/21/24   Motwani, Komal, MD  Blood Glucose Monitoring Suppl (ONETOUCH VERIO FLEX SYSTEM) w/Device KIT USE AS DIRECTED 01/16/22   Kassie Mallick, MD  budesonide -formoterol  (SYMBICORT ) 160-4.5 MCG/ACT inhaler TAKE 2 PUFFS FIRST THING IN MORNING AND THEN ANOTHER 2 PUFFS ABOUT 12 HOURS LATER. 11/25/23   Darlean Ozell NOVAK, MD  butalbital -acetaminophen -caffeine  (FIORICET) 50-325-40 MG tablet Take 1-2 tablets by mouth every 6 (six) hours as needed for headache. 04/07/24 04/07/25  Kommor, Madison, MD  carvedilol  (COREG ) 3.125 MG tablet TAKE 1 TABLET BY MOUTH TWICE A DAY WITH FOOD 12/04/23   Okey Vina GAILS, MD  cetirizine  (ZYRTEC  ALLERGY ) 10 MG tablet Take 1 tablet (10 mg total) by mouth daily. 09/05/23   Tobie Arleta SQUIBB, MD  cholecalciferol (VITAMIN D3) 25 MCG (1000 UNIT) tablet Take 1,000 Units by mouth daily. Isn't taking regularly    [provider]  clopidogrel  (PLAVIX ) 75 MG tablet Take 1 tablet (75 mg total) by mouth daily with breakfast. 12/17/23   Okey Vina GAILS, MD  denosumab  (PROLIA ) 60 MG/ML SOSY injection Inject 60 mg into the skin every 6 (six) months. Dx code: M81.0.  Pt has appointment on 11/07/23 11/06/23   Jason Leita Repine, FNP  diclofenac  Sodium (VOLTAREN ) 1 % GEL Apply 4 g topically 4 (four) times daily as needed (pain). 12/25/23   Jason Leita Repine, FNP  famotidine  (PEPCID ) 20 MG tablet Take 1 tablet (20 mg total) by mouth 2 (two) times daily. 12/03/23   Jason Leita Repine, FNP  FARXIGA  5 MG TABS tablet TAKE 1 TABLET BY MOUTH EVERY DAY BEFORE BREAKFAST 01/29/24   Motwani, Obadiah, MD  fluticasone  (FLONASE ) 50 MCG/ACT nasal spray SPRAY 2 SPRAYS INTO EACH NOSTRIL EVERY DAY 03/03/24   Tobie Arleta SQUIBB, MD  furosemide  (LASIX ) 40 MG tablet TAKE 1 TABLET BY MOUTH TWICE A DAY 09/19/23   Okey Vina GAILS, MD  glucose blood [facility] VERIO) test strip 1 each by Other route 2 (two) times daily. And lancets 2/day 05/09/23   Dartha Obadiah, MD  glucose blood test strip Check blood sugar twice a day 04/11/21   Kassie Mallick, MD  hydrALAZINE  (APRESOLINE ) 25 MG tablet Take 3 tablets (75 mg total) by mouth 3 (three) times daily. 07/30/23   Okey Vina GAILS, MD  insulin  lispro (HUMALOG ) 100 UNIT/ML injection GIVE 3 UNITS WITH BREAKFAST, AND 18 UNITS WITH SUPPER 04/21/24   Motwani, Komal, MD  latanoprost  (XALATAN ) 0.005 % ophthalmic solution Place 1 drop into both eyes at bedtime. 03/16/21   [provider]  lidocaine  (LIDODERM ) 5 % Place 1 patch onto the skin daily. Remove & Discard patch within 12 hours or as directed by MD 02/19/24   Ruthe Cornet, DO  lubiprostone  (AMITIZA ) 24 MCG capsule TAKE 1 CAPSULE (24 MCG TOTAL) BY MOUTH 2 (TWO) TIMES DAILY WITH A MEAL. Patient not taking: Reported on 04/28/2024 04/14/24 05/14/24  Federico Rosario BROCKS, MD  methylPREDNISolone  (MEDROL  DOSEPAK) 4 MG TBPK tablet Take with signs of chronic sinusitis and take as directed Patient not taking: Reported on 04/28/2024 03/06/24   Soldatova, Liuba, MD  montelukast  (SINGULAIR ) 10 MG tablet Take 1 tablet (10 mg total) by mouth at bedtime. 02/14/24   Darlean Ozell NOVAK, MD  nitroGLYCERIN  (NITROSTAT ) 0.4 MG SL tablet Place 1 tablet (0.4 mg total) under the tongue every 5 (five) minutes x 3 doses as needed for chest pain. 07/24/23   Okey Vina GAILS, MD  ondansetron  (ZOFRAN ) 8 MG tablet TAKE 1 TABLET BY MOUTH EVERY 8 HOURS AS NEEDED FOR NAUSEA OR VOMITING. 03/21/24   Jason Leita Repine, FNP  oxyCODONE  (ROXICODONE ) 5 MG  immediate release tablet Take 1 tablet (5 mg total) by mouth every 6 (six) hours as needed for up to 10 doses. 02/19/24   Curatolo, Adam, DO  pantoprazole  (PROTONIX ) 40 MG tablet Take 1 tablet (40 mg total) by mouth daily. Patient not taking: Reported on 04/28/2024 08/08/23   Federico Rosario BROCKS, MD  potassium chloride  (KLOR-CON ) 10 MEQ tablet TAKE 1 TABLET BY MOUTH EVERY DAY 12/04/23   Okey Vina GAILS, MD  promethazine -dextromethorphan (PROMETHAZINE -DM) 6.25-15 MG/5ML syrup Take 5 mLs by mouth 4 (four) times daily as needed for cough. 12/25/23   Jason Leita Repine, FNP  sodium bicarbonate 650 MG tablet Take 650 mg by mouth 2 (two) times daily. 05/19/22   [provider]  Syringe/Needle, Disp, (SYRINGE 3CC/25GX1) 25G X  1 3 ML MISC Use to inject into the skin 2x a day 04/14/24   Motwani, Komal, MD  tiZANidine  (ZANAFLEX ) 2 MG tablet Take 1 tablet (2 mg total) by mouth every 8 (eight) hours as needed for muscle spasms. 01/29/24   Jason Leita Repine, FNP  traZODone  (DESYREL ) 50 MG tablet TAKE 0.5 TABLETS BY MOUTH AT BEDTIME AS NEEDED FOR SLEEP. 05/01/24   Okey Vina GAILS, MD  ULTRAM  50 MG tablet Take 50 mg by mouth every 6 (six) hours as needed. 02/27/24   [provider]  vitamin B-12 (CYANOCOBALAMIN ) 100 MCG tablet Take 100 mcg by mouth daily.    [provider]    Allergies: Aspirin , Ramipril, and Doxycycline     Review of Systems  Updated Vital Signs BP 123/65   Pulse 60   Temp 98.1 F (36.7 C) (Oral)   Resp (!) 23   SpO2 93%   Physical Exam Constitutional:      General: She is not in acute distress. HENT:     Head: Normocephalic and atraumatic.  Eyes:     Conjunctiva/sclera: Conjunctivae normal.     Pupils: Pupils are equal, round, and reactive to light.  Cardiovascular:     Rate and Rhythm: Normal rate and regular rhythm.  Pulmonary:     Effort: Pulmonary effort is normal. No respiratory distress.     Breath sounds: No wheezing.  Abdominal:     General: There  is no distension.     Tenderness: There is no abdominal tenderness.  Skin:    General: Skin is warm and dry.  Neurological:     General: No focal deficit present.     Mental Status: She is alert. Mental status is at baseline.  Psychiatric:        Mood and Affect: Mood normal.        Behavior: Behavior normal.     (all labs ordered are listed, but only abnormal results are displayed) Labs Reviewed  BASIC METABOLIC PANEL WITH GFR - Abnormal; Notable for the following components:      Result Value   Glucose, Bld 296 (*)    BUN 34 (*)    Creatinine, Ser 2.07 (*)    GFR, Estimated 23 (*)    All other components within normal limits  CBC - Abnormal; Notable for the following components:   HCT 35.9 (*)    RDW 18.4 (*)    All other components within normal limits  PRO BRAIN NATRIURETIC PEPTIDE - Abnormal; Notable for the following components:   Pro Brain Natriuretic Peptide 3,594.0 (*)    All other components within normal limits  TROPONIN T, HIGH SENSITIVITY - Abnormal; Notable for the following components:   Troponin T High Sensitivity 38 (*)    All other components within normal limits  TROPONIN T, HIGH SENSITIVITY - Abnormal; Notable for the following components:   Troponin T High Sensitivity 36 (*)    All other components within normal limits    EKG: EKG Interpretation Date/Time:  Wednesday May 07 2024 17:48:24 EDT Ventricular Rate:  69 PR Interval:  50 QRS Duration:  129 QT Interval:  425 QTC Calculation: 456 R Axis:   36  Text Interpretation: V paced rhythm, no sig changes from prior tracing Confirmed by Cottie Cough [phone]) on 05/07/2024 5:58:51 PM  Radiology: DG Chest Portable 1 View Result Date: 05/07/2024 CLINICAL DATA:  Dyspnea and chest tightness EXAM: PORTABLE CHEST - 1 VIEW COMPARISON:  August 01, 2023 FINDINGS: Left chest pacemaker  with a single lead terminating in the right ventricle. Low lung volumes with bilateral perihilar interstitial opacities. No  focal airspace consolidation, pleural effusion, or pneumothorax. Moderate cardiomegaly. Tortuous aorta with aortic atherosclerosis. No acute fracture or destructive lesions. Multilevel thoracic osteophytosis. IMPRESSION: Bilateral perihilar interstitial opacities, likely reflecting bronchovascular crowding due to low lung volumes. Alternatively, atypical/viral infection or interstitial edema could have this appearance, in the correct clinical context. Electronically Signed   By: Rogelia Myers M.D.   On: 05/07/2024 18:36   ECHOCARDIOGRAM COMPLETE Result Date: 05/06/2024    ECHOCARDIOGRAM REPORT   Patient Name:   ANAHLI ARVANITIS   Date of Exam: 05/06/2024 Medical Rec #:  [id number]     Height:       64.0 in Accession #:    [id number]    Weight:       166.1 lb Date of Birth:  [birth year]      BSA:          1.808 m Patient Age:    88 years      BP:           144/70 mmHg Patient Gender: F             HR:           76 bpm. Exam Location:  [address] Procedure: 2D Echo, Cardiac Doppler and Color Doppler (Both Spectral and Color            Flow Doppler were utilized during procedure). Indications:    I34.2 Mitral Valve Stenosis  History:        Patient has prior history of Echocardiogram examinations, most                 recent 04/09/2023. Risk Factors:Hypertension, Diabetes and                 Dyslipidemia.  Sonographer:    Carl Rodgers-Jones RDCS Referring Phys: [id number] TESSA N CONTE IMPRESSIONS  1. Left ventricular ejection fraction, by estimation, is 55%. The left ventricle has normal function. The left ventricle has no regional wall motion abnormalities. There is mild left ventricular hypertrophy. Left ventricular diastolic parameters are indeterminate.  2. Right ventricular systolic function is mildly reduced. The right ventricular size is normal. There is mildly elevated pulmonary artery systolic pressure. The estimated right ventricular systolic pressure is 36.4 mmHg.  3. Left atrial size was severely dilated.  4.  Right atrial size was moderately dilated.  5. The mitral valve is degenerative. Trivial mitral valve regurgitation. Moderate mitral stenosis. The mean mitral valve gradient is 5.0 mmHg with average heart rate of 60 bpm. Severe mitral annular calcification.  6. The aortic valve is tricuspid. There is moderate calcification of the aortic valve. Aortic valve regurgitation is trivial. Aortic valve sclerosis/calcification is present, without any evidence of aortic stenosis.  7. The inferior vena cava is normal in size with greater than 50% respiratory variability, suggesting right atrial pressure of 3 mmHg. FINDINGS  Left Ventricle: Left ventricular ejection fraction, by estimation, is 55%. The left ventricle has normal function. The left ventricle has no regional wall motion abnormalities. The left ventricular internal cavity size was normal in size. There is mild left ventricular hypertrophy. Left ventricular diastolic parameters are indeterminate. Right Ventricle: The right ventricular size is normal. No increase in right ventricular wall thickness. Right ventricular systolic function is mildly reduced. There is mildly elevated pulmonary artery systolic pressure. The tricuspid regurgitant velocity  is 2.89 m/s, and with an assumed  right atrial pressure of 3 mmHg, the estimated right ventricular systolic pressure is 36.4 mmHg. Left Atrium: Left atrial size was severely dilated. Right Atrium: Right atrial size was moderately dilated. Pericardium: There is no evidence of pericardial effusion. The mitral valve is degenerative in appearance. Severe mitral annular calcification. Trivial mitral valve regurgitation. Moderate mitral valve stenosis. . Tricuspid Valve: The tricuspid valve is normal in structure. Tricuspid valve regurgitation is mild . No evidence of tricuspid stenosis. Aortic Valve: The aortic valve is tricuspid. There is moderate calcification of the aortic valve. Aortic valve regurgitation is trivial. Aortic  valve sclerosis/calcification is present, without any evidence of aortic stenosis. Aortic valve mean gradient measures 2.6 mmHg. Aortic valve peak gradient measures 4.5 mmHg. Aortic valve area, by VTI measures 1.80 cm. Pulmonic Valve: The pulmonic valve was normal in structure. Pulmonic valve regurgitation is mild. No evidence of pulmonic stenosis. Aorta: The aortic root is normal in size and structure. Venous: The inferior vena cava is normal in size with greater than 50% respiratory variability, suggesting right atrial pressure of 3 mmHg. IAS/Shunts: No atrial level shunt detected by color flow Doppler. Additional Comments: A device lead is visualized in the right atrium and right ventricle.  LEFT VENTRICLE PLAX 2D LVIDd:         4.90 cm LVIDs:         3.40 cm LV PW:         1.30 cm LV IVS:        1.20 cm LVOT diam:     1.90 cm LV SV:         41 LV SV Index:   23 LVOT Area:     2.84 cm  RIGHT VENTRICLE             IVC RV Basal diam:  4.40 cm     IVC diam: 0.60 cm RV S prime:     11.35 cm/s TAPSE (M-mode): 1.4 cm LEFT ATRIUM              Index        RIGHT ATRIUM           Index LA diam:        6.10 cm  3.37 cm/m   RA Area:     19.40 cm LA Vol (A2C):   101.0 ml 55.86 ml/m  RA Volume:   52.20 ml  28.87 ml/m LA Vol (A4C):   75.9 ml  41.98 ml/m LA Biplane Vol: 92.4 ml  51.11 ml/m  AORTIC VALVE AV Area (Vmax):    1.87 cm AV Area (Vmean):   1.63 cm AV Area (VTI):     1.80 cm AV Vmax:           106.34 cm/s AV Vmean:          76.856 cm/s AV VTI:            0.230 m AV Peak Grad:      4.5 mmHg AV Mean Grad:      2.6 mmHg LVOT Vmax:         70.10 cm/s LVOT Vmean:        44.300 cm/s LVOT VTI:          0.146 m LVOT/AV VTI ratio: 0.63  AORTA Ao Root diam: 2.90 cm Ao Asc diam:  3.40 cm MITRAL VALVE                TRICUSPID VALVE MV Area (PHT): 1.13 cm  TR Peak grad:   33.4 mmHg MV Area VTI:   0.71 cm     TR Vmax:        289.00 cm/s MV Peak grad:  12.1 mmHg MV Mean grad:  5.0 mmHg     SHUNTS MV Vmax:       1.74  m/s     Systemic VTI:  0.15 m MV Vmean:      102.4 cm/s   Systemic Diam: 1.90 cm MV E velocity: 169.67 cm/s Soyla Merck MD Electronically signed by Soyla Merck MD Signature Date/Time: 05/06/2024/11:12:13 AM    Final      Procedures   Medications Ordered in the ED  alum & mag hydroxide-simeth (MAALOX/MYLANTA) 200-200-20 MG/5ML suspension 30 mL (30 mLs Oral Given 05/07/24 1830)  sucralfate  (CARAFATE ) tablet 1 g (1 g Oral Given 05/07/24 1830)  sucralfate  (CARAFATE ) tablet 1 g (1 g Oral Given 05/07/24 2033)                                    Medical Decision Making Amount and/or Complexity of Data Reviewed Labs: ordered. Radiology: ordered.  Risk OTC drugs. Prescription drug management.   This patient presents to the ED with concern for chest discomfort, dyspnea on exertion. This involves an extensive number of treatment options, and is a complaint that carries with it a high risk of complications and morbidity.  The differential diagnosis includes PNA vs PTX vs atypical ACS vs CHF vs pleural effusion  vs anemia vs other  Gastritis/ reflux also on differential given location of discomfort near the epigastrum.  We'll try GI medications.  Her daughter reports patient was stopped off protonix  recently, but takes pepcid  at home.  Less likely acute PE as pt is complaint in eliquis   Co-morbidities that complicate the patient evaluation: CV risk factors, HTN, HLD  Additional history obtained from patient's daughter at bedside  External records from outside source obtained and reviewed including recent echo and cardiology office evaluation  I ordered and personally interpreted labs.  The pertinent results include: BNP elevated over 3000.  Troponin is very mildly elevated but close to baseline levels.  Chronic kidney disease noted at baseline level  I ordered imaging studies including dg chest I independently visualized and interpreted imaging which showed likely vascular pulmonary  congestion pattern I agree with the radiologist interpretation  The patient was maintained on a cardiac monitor.  I personally viewed and interpreted the cardiac monitored which showed an underlying rhythm of: V paced rhythm  Per my interpretation the patient's ECG shows V-paced rhythm, no significant changes from prior tracing  I ordered medication including GI medications Maalox and Carafate  for epigastric discomfort  I have reviewed the patients home medicines and have made adjustments as needed  Test Considered: Lower suspicion for acute pulmonary embolism in the setting.  No indication for CT imaging at this time  After the interventions noted above, I reevaluated the patient and found that they have: improved -patient's epigastric discomfort significantly improved or disappeared after the GI medications.  I suspect this issue is likely gastritis or reflux.  I recommended that they restart her on the Protonix  at home in addition to the Pepcid  that she is taking.  We can offer Carafate  as a short-term prescription as well which may help as needed.  Her shortness of breath I strongly suspect is related to congestive heart failure.  This may be related  to her valvular disease as well as recent reduction according to her daughter and her Lasix  dosing.  We will have her double her dosing for the next 7 days to see if we can help diurese her at home to improve her shortness of breath.  She is not tachypneic or hypoxic or in respiratory distress to warrant emergent hospitalization, or need for IV diuresis.  They would prefer to take Lasix  at home which is reasonable.  They have enough of this medication already at home.  They can follow-up with a cardiologist again.   Disposition:  After consideration of the diagnostic results and the patients response to treatment, I feel that the patient would benefit from outpatient follow-up      Final diagnoses:  Gastritis without bleeding, unspecified  chronicity, unspecified gastritis type  Congestive heart failure, unspecified HF chronicity, unspecified heart failure type [facility])    ED Discharge Orders          Ordered    sucralfate  (CARAFATE ) 1 g tablet  3 times daily PRN        05/07/24 2003               Cottie Donnice PARAS, MD 05/07/24 2121

## 2024-05-07 NOTE — Discharge Instructions (Addendum)
 Your x-ray and blood test suggest that you may be having a flareup of your congestive heart failure, meaning there is more fluid on your lungs.  This can make you feel short of breath.  This is something you should discuss with your cardiologist.  In the meantime, for the next 7 days, I recommend that you double your lasix  daily dosing, from once daily to twice daily.  After that you can return to your regular dosing. You should follow up with your primary care clinic to recheck your shortness of breath and symptoms next week.  *  If you have worsening difficulty breathing, lightheadedness, chest pain or discomfort, or any other emergency concerns, return to the ER.

## 2024-05-07 NOTE — Telephone Encounter (Signed)
 FYI Only or Action Required?: Action required by provider: request for appointment.  Patient is followed in Pulmonology for asthma, last seen on 02/14/2024 by Darlean Ozell NOVAK, MD.  Called Nurse Triage reporting Shortness of Breath.  Symptoms began a week ago.  Interventions attempted: Rescue inhaler, Maintenance inhaler, and Nebulizer treatments.  Symptoms are: unchanged.  Triage Disposition: See HCP Within 4 Hours (Or PCP Triage)-recommended to urgent care today-patient verbalized understanding  Patient/caregiver understands and will follow disposition?: Yes  Copied from CRM #[id number]. Topic: Clinical - Red Word Triage >> May 07, 2024  3:55 PM Nathanel DEL wrote: Red Word that prompted transfer to Nurse Triage: pt having breathing problems, SOB. Just got off breathing problems Reason for Disposition  [1] Longstanding difficulty breathing (e.g., CHF, COPD, emphysema) AND [2] WORSE than normal  Answer Assessment - Initial Assessment Questions 1. RESPIRATORY STATUS: Describe your breathing? (e.g., wheezing, shortness of breath, unable to speak, severe coughing)      Shortness of breath 2. ONSET: When did this breathing problem begin?      Started about a week ago 3. PATTERN Does the difficult breathing come and go, or has it been constant since it started?      constant 4. SEVERITY: How bad is your breathing? (e.g., mild, moderate, severe)      moderate 5. RECURRENT SYMPTOM: Have you had difficulty breathing before? If Yes, ask: When was the last time? and What happened that time?      Yes-last time was about a year ago 6. CARDIAC HISTORY: Do you have any history of heart disease? (e.g., heart attack, angina, bypass surgery, angioplasty)      pacemaker 7. LUNG HISTORY: Do you have any history of lung disease?  (e.g., pulmonary embolus, asthma, emphysema)     asthma 8. CAUSE: What do you think is causing the breathing problem?      unsure 9. OTHER SYMPTOMS: Do you  have any other symptoms? (e.g., chest pain, cough, dizziness, fever, runny nose)     Cough sometimes 10. O2 SATURATION MONITOR:  Do you use an oxygen saturation monitor (pulse oximeter) at home? If Yes, ask: What is your reading (oxygen level) today? What is your usual oxygen saturation reading? (e.g., 95%)       Patient doesn't check her oxygen saturation at home 12. TRAVEL: Have you traveled out of the country in the last month? (e.g., travel history, exposures)       no  Protocols used: Breathing Difficulty-A-AH

## 2024-05-08 ENCOUNTER — Telehealth: Payer: Self-pay

## 2024-05-08 NOTE — Telephone Encounter (Signed)
 Copied from CRM [id number]. Topic: Clinical - Medical Advice >> May 08, 2024  8:55 AM Chasity T wrote: Reason for CRM: Rosaline daughter of patient is calling to request to speak with a nurse regarding her mother recent hospital visit on medication increase and additional testing. Please call her back on number provided in char to speak with her

## 2024-05-08 NOTE — Telephone Encounter (Signed)
 ATC X1. LMTCB. I asked Dr Darlean via secure chat if we could use a blocked slot for Friday, 05-09-24. Dr Darlean stated we could use 1 as long as the other was not touch due to a meeting. If pt does not want to be seen tomorrow, Pt can be seen at his next availability.  that's fine - bring all meds but be sure no other add on as I have a noon meeting to attend

## 2024-05-09 ENCOUNTER — Inpatient Hospital Stay: Admitting: Family

## 2024-05-09 NOTE — Telephone Encounter (Signed)
 Attempted to call patient. Left voicemail to give our office a call if she would be able to come in at 10:30 to see Dr. Darlean today.

## 2024-05-09 NOTE — Telephone Encounter (Signed)
 ATC x2. Left message to call back. Please see [name]'s previous message. Dr wert OK to use 1 block spot for this patient today, if patient agrees.  Please make sure one blocked spot stays available per Dr. Leisa request.  Routing to front staff to trying schedule.

## 2024-05-12 ENCOUNTER — Ambulatory Visit: Payer: Self-pay | Admitting: Internal Medicine

## 2024-05-13 ENCOUNTER — Ambulatory Visit (INDEPENDENT_AMBULATORY_CARE_PROVIDER_SITE_OTHER): Admitting: Family

## 2024-05-13 ENCOUNTER — Encounter: Payer: Self-pay | Admitting: "Endocrinology

## 2024-05-13 ENCOUNTER — Ambulatory Visit (INDEPENDENT_AMBULATORY_CARE_PROVIDER_SITE_OTHER): Admitting: "Endocrinology

## 2024-05-13 ENCOUNTER — Ambulatory Visit (HOSPITAL_BASED_OUTPATIENT_CLINIC_OR_DEPARTMENT_OTHER)
Admission: RE | Admit: 2024-05-13 | Discharge: 2024-05-13 | Disposition: A | Discharge status: Home / Self Care | Source: Ambulatory Visit | Attending: Family | Admitting: Family

## 2024-05-13 VITALS — BP 110/80 | HR 74

## 2024-05-13 VITALS — BP 138/80 | HR 69 | Ht 64.0 in | Wt 162.6 lb

## 2024-05-13 DIAGNOSIS — R600 Localized edema: Secondary | ICD-10-CM | POA: Diagnosis not present

## 2024-05-13 DIAGNOSIS — E1165 Type 2 diabetes mellitus with hyperglycemia: Secondary | ICD-10-CM | POA: Diagnosis not present

## 2024-05-13 DIAGNOSIS — E7800 Pure hypercholesterolemia, unspecified: Secondary | ICD-10-CM

## 2024-05-13 DIAGNOSIS — Z7984 Long term (current) use of oral hypoglycemic drugs: Secondary | ICD-10-CM | POA: Diagnosis not present

## 2024-05-13 DIAGNOSIS — Z794 Long term (current) use of insulin: Secondary | ICD-10-CM

## 2024-05-13 DIAGNOSIS — I5042 Chronic combined systolic (congestive) and diastolic (congestive) heart failure: Secondary | ICD-10-CM | POA: Diagnosis not present

## 2024-05-13 DIAGNOSIS — Z950 Presence of cardiac pacemaker: Secondary | ICD-10-CM | POA: Diagnosis not present

## 2024-05-13 DIAGNOSIS — N184 Chronic kidney disease, stage 4 (severe): Secondary | ICD-10-CM | POA: Diagnosis not present

## 2024-05-13 LAB — POCT GLYCOSYLATED HEMOGLOBIN (HGB A1C): Hemoglobin A1C: 7.4 % — AB (ref 4.0–5.6)

## 2024-05-13 MED ORDER — BAQSIMI ONE PACK 3 MG/DOSE NA POWD: 1.0000 | NASAL | 3 refills | Status: DC | PRN

## 2024-05-13 NOTE — Progress Notes (Signed)
 Outpatient Endocrinology Note Sheryl Birmingham, MD    [name] [birth year] [id number]  Referring Provider: Jason Leita Rose,* Primary Care Provider: Jason Leita Repine, FNP Reason for consultation: Subjective  Type 2 diabetes mellitus  Assessment & Plan  Diagnoses and all orders for this visit:  Uncontrolled type 2 diabetes mellitus with hyperglycemia (HCC) -     POCT glycosylated hemoglobin (Hb A1C) -     Glucagon  (BAQSIMI  ONE PACK) 3 MG/DOSE POWD; Place 1 Device into the nose as needed (Low blood sugar with impaired consciousness).  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, Stage 4 CKD Hba1c goal less than 7.0, current Hba1c is 9.9 (on prednisone  10 mg every day)  Lab Results  Component Value Date   HGBA1C 7.4 (A) 05/13/2024   HGBA1C 7.4 (H) 11/16/2023   HGBA1C 9.9 (A) 05/09/2023    Will recommend for the following change of medications to: Farxiga  5 mg daily  Humalog  3 units fifteen min before breakfast and 18 units before dinner Check BG TIDAC and share data: unable to make changes to her dose today due to limited data BG goal: 150 mg 2 hour after meals, given log sheet  No known contraindications to any of above medications Pacemaker restricts use of libre-not studied  Hyperlipidemia, overweight  -Last LDL 41 -on atorvastatin  80 mg QD -Follow low fat diet and exercise   -Blood pressure goal <140/90 - Microalbumin/creatinine on goal < 30 in 08/2022, GFR 16 -Not on ACE/ARB, deferred to nephrology  -diet changes including salt restriction -limit eating outside -counseled BP targets per standards of diabetes care -Uncontrolled blood pressure can lead to retinopathy, nephropathy and cardiovascular and atherosclerotic heart disease  Reviewed and counseled on: -A1C target -Blood sugar  targets -Complications of uncontrolled diabetes  -Checking blood sugar before meals and bedtime and bring log next visit -All medications with mechanism of action and side effects -Hypoglycemia management: rule of 15's, Glucagon  Emergency Kit and medical alert ID -low-carb low-fat plate-method diet -At least 20 minutes of physical activity per day -Annual dilated retinal eye exam and foot exam -compliance and follow up needs -follow up as scheduled or earlier if problem gets worse  Call if blood sugar is less than 70 or consistently above 250    Take a 15 gm snack of carbohydrate at bedtime before you go to sleep if your blood sugar is less than 100.    If you are going to fast after midnight for a test or procedure, ask your physician for instructions on how to reduce/decrease your insulin  dose.    Call if blood sugar is less than 70 or consistently above 250  -Treating a low sugar by rule of 15  (15 gms of sugar every 15 min until sugar is more than 70) If you feel your sugar is low, test your sugar to be sure If your sugar is low (less than 70), then take 15 grams of a fast acting Carbohydrate (3-4 glucose tablets or glucose gel or 4 ounces of juice or regular soda) Recheck your sugar 15 min after treating low to make sure it is more than 70 If sugar is still less than 70, treat again with 15 grams of carbohydrate          Don't drive the hour of hypoglycemia  If unconscious/unable to eat or  drink by mouth, use glucagon  injection or nasal spray baqsimi  and call 911. Can repeat again in 15 min if still unconscious.  Return in about 3 months (around 08/13/2024).   I spent more than 50% of today's visit counseling patient on symptoms, examination findings, lab findings, imaging results, treatment decisions and monitoring and prognosis. The patient understood the recommendations and agrees with the treatment plan. All questions regarding treatment plan were fully answered.  Sheryl Birmingham, MD  05/13/24    History of Present Illness [name] is a 88 y.o. year old female who presents for follow up of Type 2 diabetes mellitus.  [name] was first diagnosed in 1996.    Patient is accompanied by daughter who lives with her   Home diabetes regimen: Farxiga  5 mg daily  Humalog  3 units before breakfast and 18 before dinner - 5 min before meals   Previous history:  Non-insulin  hypoglycemic drugs previously used: Unknown Insulin  was started in 2008 Previously on Lantus  insulin  that was stopped in 2021   COMPLICATIONS +  MI s/p stents  -  retinopathy, last eye exam 2023 -  neuropathy +  nephropathy  BLOOD SUGAR DATA Checks 0-2 times/day Range 116-287  Physical Exam  BP 110/80   Pulse 74   SpO2 96%    Constitutional: well developed, well nourished Head: normocephalic, atraumatic Eyes: sclera anicteric, no redness Neck: supple Lungs: normal respiratory effort Neurology: alert and oriented Skin: dry, no appreciable rashes Musculoskeletal: no appreciable defects Psychiatric: normal mood and affect Diabetic Foot Exam - Simple   No data filed      Current Medications Patient's Medications  New Prescriptions   GLUCAGON  (BAQSIMI  ONE PACK) 3 MG/DOSE POWD    Place 1 Device into the nose as needed (Low blood sugar with impaired consciousness).  Previous Medications   ACETAMINOPHEN  (TYLENOL ) 325 MG TABLET    Take 650 mg by mouth every 6 (six) hours as needed for pain.   ALBUTEROL  (PROAIR  HFA) 108 (90 BASE) MCG/ACT INHALER    2 puffs every 4 hours as needed only  if your can't catch your breath   ALBUTEROL  (PROVENTIL ) (2.5 MG/3ML) 0.083% NEBULIZER SOLUTION    Take 3 mLs (2.5 mg total) by nebulization every 6 (six) hours as needed for wheezing or shortness of breath.   AMOXICILLIN -CLAVULANATE (AUGMENTIN ) 875-125 MG TABLET    Take 1 tablet by mouth 2 (two) times daily.   APIXABAN  (ELIQUIS ) 2.5 MG TABS TABLET    TAKE 1 TABLET BY MOUTH TWICE A DAY    ASCORBIC ACID (VITAMIN C) 1000 MG TABLET    Take 1,000 mg by mouth daily.   ATORVASTATIN  (LIPITOR ) 80 MG TABLET    TAKE 1 TABLET BY MOUTH EVERY DAY   AZELASTINE  HCL 137 MCG/SPRAY SOLN    PLACE 2 SPRAYS INTO BOTH NOSTRILS 2 (TWO) TIMES DAILY AS NEEDED FOR ALLERGIES. USE IN EACH NOSTRIL AS DIRECTED   BD VEO INSULIN  SYRINGE U/F 31G X 15/64 0.5 ML MISC    Use as instructed   BLOOD GLUCOSE MONITORING SUPPL (ONETOUCH VERIO FLEX SYSTEM) W/DEVICE KIT    USE AS DIRECTED   BUDESONIDE -FORMOTEROL  (SYMBICORT ) 160-4.5 MCG/ACT INHALER    TAKE 2 PUFFS FIRST THING IN MORNING AND THEN ANOTHER 2 PUFFS ABOUT 12 HOURS LATER.   BUTALBITAL -ACETAMINOPHEN -CAFFEINE  (FIORICET) 50-325-40 MG TABLET    Take 1-2 tablets by mouth every 6 (six) hours as needed for headache.   CARVEDILOL  (COREG ) 3.125 MG TABLET    TAKE 1  TABLET BY MOUTH TWICE A DAY WITH FOOD   CETIRIZINE  (ZYRTEC  ALLERGY ) 10 MG TABLET    Take 1 tablet (10 mg total) by mouth daily.   CHOLECALCIFEROL (VITAMIN D3) 25 MCG (1000 UNIT) TABLET    Take 1,000 Units by mouth daily. Isn't taking regularly   CLOPIDOGREL  (PLAVIX ) 75 MG TABLET    Take 1 tablet (75 mg total) by mouth daily with breakfast.   DENOSUMAB  (PROLIA ) 60 MG/ML SOSY INJECTION    Inject 60 mg into the skin every 6 (six) months. Dx code: M81.0.  Pt has appointment on 11/07/23   DICLOFENAC  SODIUM (VOLTAREN ) 1 % GEL    Apply 4 g topically 4 (four) times daily as needed (pain).   FAMOTIDINE  (PEPCID ) 20 MG TABLET    Take 1 tablet (20 mg total) by mouth 2 (two) times daily.   FARXIGA  5 MG TABS TABLET    TAKE 1 TABLET BY MOUTH EVERY DAY BEFORE BREAKFAST   FLUTICASONE  (FLONASE ) 50 MCG/ACT NASAL SPRAY    SPRAY 2 SPRAYS INTO EACH NOSTRIL EVERY DAY   FUROSEMIDE  (LASIX ) 40 MG TABLET    TAKE 1 TABLET BY MOUTH TWICE A DAY   GLUCOSE BLOOD (ONETOUCH VERIO) TEST STRIP    1 each by Other route 2 (two) times daily. And lancets 2/day   GLUCOSE BLOOD TEST STRIP    Check blood sugar twice a day   HYDRALAZINE  (APRESOLINE ) 25 MG  TABLET    Take 3 tablets (75 mg total) by mouth 3 (three) times daily.   INSULIN  LISPRO (HUMALOG ) 100 UNIT/ML INJECTION    GIVE 3 UNITS WITH BREAKFAST, AND 18 UNITS WITH SUPPER   LATANOPROST  (XALATAN ) 0.005 % OPHTHALMIC SOLUTION    Place 1 drop into both eyes at bedtime.   LIDOCAINE  (LIDODERM ) 5 %    Place 1 patch onto the skin daily. Remove & Discard patch within 12 hours or as directed by MD   LUBIPROSTONE  (AMITIZA ) 24 MCG CAPSULE    TAKE 1 CAPSULE (24 MCG TOTAL) BY MOUTH 2 (TWO) TIMES DAILY WITH A MEAL.   METHYLPREDNISOLONE  (MEDROL  DOSEPAK) 4 MG TBPK TABLET    Take with signs of chronic sinusitis and take as directed   MONTELUKAST  (SINGULAIR ) 10 MG TABLET    Take 1 tablet (10 mg total) by mouth at bedtime.   NITROGLYCERIN  (NITROSTAT ) 0.4 MG SL TABLET    Place 1 tablet (0.4 mg total) under the tongue every 5 (five) minutes x 3 doses as needed for chest pain.   ONDANSETRON  (ZOFRAN ) 8 MG TABLET    TAKE 1 TABLET BY MOUTH EVERY 8 HOURS AS NEEDED FOR NAUSEA OR VOMITING.   OXYCODONE  (ROXICODONE ) 5 MG IMMEDIATE RELEASE TABLET    Take 1 tablet (5 mg total) by mouth every 6 (six) hours as needed for up to 10 doses.   PANTOPRAZOLE  (PROTONIX ) 40 MG TABLET    Take 1 tablet (40 mg total) by mouth daily.   POTASSIUM CHLORIDE  (KLOR-CON ) 10 MEQ TABLET    TAKE 1 TABLET BY MOUTH EVERY DAY   PROMETHAZINE -DEXTROMETHORPHAN (PROMETHAZINE -DM) 6.25-15 MG/5ML SYRUP    Take 5 mLs by mouth 4 (four) times daily as needed for cough.   SODIUM BICARBONATE 650 MG TABLET    Take 650 mg by mouth 2 (two) times daily.   SUCRALFATE  (CARAFATE ) 1 G TABLET    Take 1 tablet (1 g total) by mouth 3 (three) times daily as needed for up to 60 doses.   SYRINGE/NEEDLE, DISP, (SYRINGE 3CC/25GX1) 25G X  1 3 ML MISC    Use to inject into the skin 2x a day   TIZANIDINE  (ZANAFLEX ) 2 MG TABLET    Take 1 tablet (2 mg total) by mouth every 8 (eight) hours as needed for muscle spasms.   TRAZODONE  (DESYREL ) 50 MG TABLET    TAKE 0.5 TABLETS BY MOUTH AT  BEDTIME AS NEEDED FOR SLEEP.   ULTRAM  50 MG TABLET    Take 50 mg by mouth every 6 (six) hours as needed.   VITAMIN B-12 (CYANOCOBALAMIN ) 100 MCG TABLET    Take 100 mcg by mouth daily.  Modified Medications   No medications on file  Discontinued Medications   No medications on file    Allergies Allergies  Allergen Reactions   Aspirin  Shortness Of Breath and Other (See Comments)    Caused asthma symptoms   Ramipril Cough   Doxycycline      Nausea    Past Medical History Past Medical History:  Diagnosis Date   Allergic rhinitis    Anterior chest wall pain    Anxiety    Asthma    Chronic diastolic CHF (congestive heart failure) (HCC)    Echo 01/2020: EF 60-65, normal wall motion, mild LVH, normal RV SF, RVSP 52.6 (moderate elevation), severe LAE, moderate RAE, trivial MR, mild MS (mean gradient 5.5 mmHg), mild aortic valve sclerosis (no AS); elevated E/e' c/w elevated LVEDP   Cough    DM type 2 (diabetes mellitus, type 2) (HCC)    GERD (gastroesophageal reflux disease)    History of diverticulitis of colon    HTN (hypertension)    Hyperlipidemia    IBS (irritable bowel syndrome)    Iron deficiency anemia    Mitral stenosis 04/17/2022   Echo 04/2022: EF 55-60, no RWMA, mild LVH, normal RVSF, normal PASP, moderate LAE, moderate mitral stenosis (mean gradient 4 mmHg), trivial MR, trivial AI, AV sclerosis without stenosis   Osteoporosis, unspecified    Renal insufficiency 07/31/2017   UTI (urinary tract infection)     Past Surgical History Past Surgical History:  Procedure Laterality Date   CARDIOVASCULAR STRESS TEST  02/25/04   CORONARY BALLOON ANGIOPLASTY N/A 12/28/2021   Procedure: CORONARY BALLOON ANGIOPLASTY;  Surgeon: Verlin Lonni BIRCH, MD;  Location: [facility];  Service: Cardiovascular;  Laterality: N/A;   CORONARY STENT INTERVENTION N/A 12/28/2021   Procedure: CORONARY STENT INTERVENTION;  Surgeon: Verlin Lonni BIRCH, MD;  Location: [facility];   Service: Cardiovascular;  Laterality: N/A;   ESOPHAGOGASTRODUODENOSCOPY  12/26/01   PACEMAKER IMPLANT N/A 08/02/2020   Procedure: PACEMAKER IMPLANT;  Surgeon: Fernande Elspeth BROCKS, MD;  Location[facility];  Service: Cardiovascular;  Laterality: N/A;   RIGHT OOPHORECTOMY  jan 2010   RIGHT/LEFT HEART CATH AND CORONARY ANGIOGRAPHY N/A 12/28/2021   Procedure: RIGHT/LEFT HEART CATH AND CORONARY ANGIOGRAPHY;  Surgeon: Verlin Lonni BIRCH, MD;  Location: [facility];  Service: Cardiovascular;  Laterality: N/A;    Family History family history includes Cancer in her sister; Diabetes in her daughter; Hypertension in her mother; Lung cancer in her brother; Melanoma in her brother; Other in her father; Stroke in her mother.  Social History Social History   Socioeconomic History   Marital status: Widowed    Spouse name: Not on file   Number of children: 5   Years of education: Not on file   Highest education level: Not on file  Occupational History   Occupation: retired  Tobacco Use   Smoking status:  Former    Current packs/day: 0.00    Average packs/day: 0.3 packs/day for 5.0 years (1.5 ttl pk-yrs)    Types: Cigarettes    Start date: 10/02/1981    Quit date: 10/02/1986    Years since quitting: 37.6   Smokeless tobacco: Never  Vaping Use   Vaping status: Never Used  Substance and Sexual Activity   Alcohol use: No   Drug use: No   Sexual activity: Not on file  Other Topics Concern   Not on file  Social History Narrative   Not on file   Social Drivers of Health   Financial Resource Strain: Low Risk  (06/13/2023)   Overall Financial Resource Strain (CARDIA)    Difficulty of Paying Living Expenses: Not hard at all  Food Insecurity: No Food Insecurity (06/13/2023)   Hunger Vital Sign    Worried About Running Out of Food in the Last Year: Never true    Ran Out of Food in the Last Year: Never true  Transportation Needs: No Transportation Needs (06/13/2023)   PRAPARE -  Administrator, Civil Service (Medical): No    Lack of Transportation (Non-Medical): No  Physical Activity: Inactive (06/13/2023)   Exercise Vital Sign    Days of Exercise per Week: 0 days    Minutes of Exercise per Session: 0 min  Stress: No Stress Concern Present (06/13/2023)   [employer] of Occupational Health - Occupational Stress Questionnaire    Feeling of Stress : Not at all  Social Connections: Moderately Integrated (06/13/2023)   Social Connection and Isolation Panel    Frequency of Communication with Friends and Family: More than three times a week    Frequency of Social Gatherings with Friends and Family: More than three times a week    Attends Religious Services: More than 4 times per year    Active Member of [employer] or Organizations: Yes    Attends Banker Meetings: 1 to 4 times per year    Marital Status: Widowed  Intimate Partner Violence: Not At Risk (06/13/2023)   Humiliation, Afraid, Rape, and Kick questionnaire    Fear of Current or Ex-Partner: No    Emotionally Abused: No    Physically Abused: No    Sexually Abused: No    Lab Results  Component Value Date   HGBA1C 7.4 (A) 05/13/2024   Lab Results  Component Value Date   CHOL 105 02/05/2023   Lab Results  Component Value Date   HDL 47.00 02/05/2023   Lab Results  Component Value Date   LDLCALC 41 02/05/2023   Lab Results  Component Value Date   TRIG 89.0 02/05/2023   Lab Results  Component Value Date   CHOLHDL 2 02/05/2023   Lab Results  Component Value Date   CREATININE 2.07 (H) 05/07/2024   Lab Results  Component Value Date   GFR 18.09 (L) 08/01/2023   Lab Results  Component Value Date   MICROALBUR 1.76 09/17/2012      Component Value Date/Time   NA 136 05/07/2024 1845   NA 141 11/08/2023 1402   K 4.7 05/07/2024 1845   CL 100 05/07/2024 1845   CO2 22 05/07/2024 1845   GLUCOSE 296 (H) 05/07/2024 1845   BUN 34 (H) 05/07/2024 1845   BUN 30 (H) 11/08/2023  1402   CREATININE 2.07 (H) 05/07/2024 1845   CREATININE 1.74 (H) 08/28/2016 1042   CALCIUM  9.6 05/07/2024 1845   CALCIUM  10.2 07/06/2011 1144   PROT  6.6 04/07/2024 1510   PROT 6.8 09/05/2023 1206   ALBUMIN 3.3 (L) 04/07/2024 1510   AST 17 04/07/2024 1510   ALT 12 04/07/2024 1510   ALKPHOS 85 04/07/2024 1510   BILITOT 0.5 04/07/2024 1510   GFRNONAA 23 (L) 05/07/2024 1845   GFRAA 28 (L) 08/06/2020 1526      Latest Ref Rng & Units 05/07/2024    6:45 PM 04/07/2024    3:10 PM 02/19/2024    7:26 PM  BMP  Glucose 70 - 99 mg/dL 703  662  702   BUN 8 - 23 mg/dL 34  25  38   Creatinine 0.44 - 1.00 mg/dL 7.92  7.97  7.71   Sodium 135 - 145 mmol/L 136  138  135   Potassium 3.5 - 5.1 mmol/L 4.7  4.2  4.3   Chloride 98 - 111 mmol/L 100  103  99   CO2 22 - 32 mmol/L 22  21  18    Calcium  8.9 - 10.3 mg/dL 9.6  9.6  9.6        Component Value Date/Time   WBC 9.1 05/07/2024 1845   RBC 4.43 05/07/2024 1845   HGB 12.4 05/07/2024 1845   HGB 13.6 11/08/2023 1402   HCT 35.9 (L) 05/07/2024 1845   HCT 40.5 11/08/2023 1402   PLT 154 05/07/2024 1845   PLT 179 11/08/2023 1402   MCV 81.0 05/07/2024 1845   MCV 88 11/08/2023 1402   MCH 28.0 05/07/2024 1845   MCHC 34.5 05/07/2024 1845   RDW 18.4 (H) 05/07/2024 1845   RDW 14.9 11/08/2023 1402   LYMPHSABS 0.4 (L) 02/19/2024 1926   LYMPHSABS 1.0 11/08/2023 1402   MONOABS 0.9 02/19/2024 1926   EOSABS 0.0 02/19/2024 1926   EOSABS 0.2 11/08/2023 1402   BASOSABS 0.0 02/19/2024 1926   BASOSABS 0.1 11/08/2023 1402     Parts of this note may have been dictated using voice recognition software. There may be variances in spelling and vocabulary which are unintentional. Not all errors are proofread. Please notify the dino if any discrepancies are noted or if the meaning of any statement is not clear.

## 2024-05-13 NOTE — Progress Notes (Signed)
 [name] is a 88 y.o. female with the following history as recorded in [employer]:  Patient Active Problem List   Diagnosis Date Noted   Acute bronchitis 05/03/2023   Acute bacterial rhinosinusitis 05/03/2023   Osteoarthritis of right knee 07/04/2022   CKD (chronic kidney disease) stage 4, GFR 15-29 ml/min (HCC) 06/27/2022   Mitral stenosis 04/17/2022   Acute kidney injury superimposed on chronic kidney disease (HCC) 01/02/2022   NSTEMI (non-ST elevated myocardial infarction) (HCC) 12/23/2021   Osteoarthritis of knee 03/30/2021   Bilateral hand pain 03/08/2021   Complete heart block (HCC) 12/13/2020   Pacemaker - MDT 12/13/2020   DOE (dyspnea on exertion) 07/29/2020   Encntr for surgical aftcr following surgery on the circ sys 07/26/2020   Hypertensive heart and chronic kidney disease with heart failure and stage 1 through stage 4 chronic kidney disease, or unspecified chronic kidney disease (HCC) 07/26/2020   Pain in right knee 03/17/2020   Chronic combined systolic (congestive) and diastolic (congestive) heart failure (HCC)    DKA (diabetic ketoacidosis) (HCC) 12/20/2019   Diverticulosis 12/10/2019   Age-related osteoporosis without current pathological fracture 10/03/2019   Chronic kidney disease, stage 3 unspecified (HCC) 10/03/2019   Long term (current) use of insulin  (HCC) 10/03/2019   Hyperlipidemia, unspecified 10/03/2019   Long term (current) use of inhaled steroids 10/03/2019   Shortness of breath 01/30/2018   Acute bronchiolitis 12/17/2017   Wheezing 10/29/2017   Vitamin D  deficiency 07/31/2017   Renal insufficiency 07/31/2017   Knee pain 06/01/2017   Morbid obesity due to excess calories (HCC) 01/25/2017   Diabetes (HCC) 04/12/2016   Cough 01/20/2015   Atrial fibrillation (HCC) 05/28/2014   History of colonic polyps 05/28/2014   Vomiting 01/29/2014   CAD (coronary artery disease) 09/01/2013   Routine general medical examination at a health care facility  07/17/2011   Encounter for long-term (current) use of other medications 07/06/2011   Nonspecific (abnormal) findings on radiological and other examination of body structure 11/09/2009   IRRITABLE BOWEL SYNDROME 05/07/2009   CHEST PAIN 05/07/2009   ADNEXAL MASS, RIGHT 07/22/2008   HEMORRHOIDS, RECURRENT 01/27/2008   Diverticulitis 01/27/2008   OTH ABNORMAL FIND RAD EXAMINATION BREAST 01/27/2008   GERD 01/13/2008   UTI 09/28/2007   Dyslipidemia 05/27/2007   ANEMIA-IRON DEFICIENCY 05/27/2007   ANXIETY 05/27/2007   Essential hypertension 05/27/2007   Seasonal allergic rhinitis 05/27/2007   Cough variant asthma 05/27/2007   Osteoporosis 05/27/2007   DIVERTICULITIS, HX OF 05/27/2007    Current Outpatient Medications  Medication Sig Dispense Refill   acetaminophen  (TYLENOL ) 325 MG tablet Take 650 mg by mouth every 6 (six) hours as needed for pain.     albuterol  (PROAIR  HFA) 108 (90 Base) MCG/ACT inhaler 2 puffs every 4 hours as needed only  if your can't catch your breath 18 g 11   albuterol  (PROVENTIL ) (2.5 MG/3ML) 0.083% nebulizer solution Take 3 mLs (2.5 mg total) by nebulization every 6 (six) hours as needed for wheezing or shortness of breath. 150 mL 1   amoxicillin -clavulanate (AUGMENTIN ) 875-125 MG tablet Take 1 tablet by mouth 2 (two) times daily. 20 tablet 0   apixaban  (ELIQUIS ) 2.5 MG TABS tablet TAKE 1 TABLET BY MOUTH TWICE A DAY 60 tablet 6   Ascorbic Acid (VITAMIN C) 1000 MG tablet Take 1,000 mg by mouth daily.     atorvastatin  (LIPITOR ) 80 MG tablet TAKE 1 TABLET BY MOUTH EVERY DAY 90 tablet 3   Azelastine  HCl 137 MCG/SPRAY SOLN PLACE 2 SPRAYS  INTO BOTH NOSTRILS 2 (TWO) TIMES DAILY AS NEEDED FOR ALLERGIES. USE IN EACH NOSTRIL AS DIRECTED 90 mL 1   BD VEO INSULIN  SYRINGE U/F 31G X 15/64 0.5 ML MISC Use as instructed 100 each 3   Blood Glucose Monitoring Suppl (ONETOUCH VERIO FLEX SYSTEM) w/Device KIT USE AS DIRECTED 1 kit .   budesonide -formoterol  (SYMBICORT ) 160-4.5 MCG/ACT  inhaler TAKE 2 PUFFS FIRST THING IN MORNING AND THEN ANOTHER 2 PUFFS ABOUT 12 HOURS LATER. 30.6 each 6   butalbital -acetaminophen -caffeine  (FIORICET) 50-325-40 MG tablet Take 1-2 tablets by mouth every 6 (six) hours as needed for headache. 20 tablet 0   carvedilol  (COREG ) 3.125 MG tablet TAKE 1 TABLET BY MOUTH TWICE A DAY WITH FOOD 180 tablet 2   cetirizine  (ZYRTEC  ALLERGY ) 10 MG tablet Take 1 tablet (10 mg total) by mouth daily. 30 tablet 5   cholecalciferol (VITAMIN D3) 25 MCG (1000 UNIT) tablet Take 1,000 Units by mouth daily. Isn't taking regularly     clopidogrel  (PLAVIX ) 75 MG tablet Take 1 tablet (75 mg total) by mouth daily with breakfast. 90 tablet 3   denosumab  (PROLIA ) 60 MG/ML SOSY injection Inject 60 mg into the skin every 6 (six) months. Dx code: M81.0.  Pt has appointment on 11/07/23 1 mL 0   diclofenac  Sodium (VOLTAREN ) 1 % GEL Apply 4 g topically 4 (four) times daily as needed (pain). 150 g 0   famotidine  (PEPCID ) 20 MG tablet Take 1 tablet (20 mg total) by mouth 2 (two) times daily. 180 tablet 2   FARXIGA  5 MG TABS tablet TAKE 1 TABLET BY MOUTH EVERY DAY BEFORE BREAKFAST 30 tablet 3   fluticasone  (FLONASE ) 50 MCG/ACT nasal spray SPRAY 2 SPRAYS INTO EACH NOSTRIL EVERY DAY 48 mL 1   furosemide  (LASIX ) 40 MG tablet TAKE 1 TABLET BY MOUTH TWICE A DAY 180 tablet 3   Glucagon  (BAQSIMI  ONE PACK) 3 MG/DOSE POWD Place 1 Device into the nose as needed (Low blood sugar with impaired consciousness). 2 each 3   glucose blood (ONETOUCH VERIO) test strip 1 each by Other route 2 (two) times daily. And lancets 2/day 200 each 3   glucose blood test strip Check blood sugar twice a day 100 each 12   hydrALAZINE  (APRESOLINE ) 25 MG tablet Take 3 tablets (75 mg total) by mouth 3 (three) times daily. 810 tablet 3   insulin  lispro (HUMALOG ) 100 UNIT/ML injection GIVE 3 UNITS WITH BREAKFAST, AND 18 UNITS WITH SUPPER 10 mL 11   latanoprost  (XALATAN ) 0.005 % ophthalmic solution Place 1 drop into both eyes at  bedtime.     lidocaine  (LIDODERM ) 5 % Place 1 patch onto the skin daily. Remove & Discard patch within 12 hours or as directed by MD 30 patch 0   lubiprostone  (AMITIZA ) 24 MCG capsule TAKE 1 CAPSULE (24 MCG TOTAL) BY MOUTH 2 (TWO) TIMES DAILY WITH A MEAL. 180 capsule 1   methylPREDNISolone  (MEDROL  DOSEPAK) 4 MG TBPK tablet Take with signs of chronic sinusitis and take as directed 1 each 1   montelukast  (SINGULAIR ) 10 MG tablet Take 1 tablet (10 mg total) by mouth at bedtime. 90 tablet 3   nitroGLYCERIN  (NITROSTAT ) 0.4 MG SL tablet Place 1 tablet (0.4 mg total) under the tongue every 5 (five) minutes x 3 doses as needed for chest pain. 25 tablet 2   ondansetron  (ZOFRAN ) 8 MG tablet TAKE 1 TABLET BY MOUTH EVERY 8 HOURS AS NEEDED FOR NAUSEA OR VOMITING. 18 tablet 1   oxyCODONE  (ROXICODONE )  5 MG immediate release tablet Take 1 tablet (5 mg total) by mouth every 6 (six) hours as needed for up to 10 doses. 10 tablet 0   pantoprazole  (PROTONIX ) 40 MG tablet Take 1 tablet (40 mg total) by mouth daily. 180 tablet 3   potassium chloride  (KLOR-CON ) 10 MEQ tablet TAKE 1 TABLET BY MOUTH EVERY DAY 90 tablet 2   promethazine -dextromethorphan (PROMETHAZINE -DM) 6.25-15 MG/5ML syrup Take 5 mLs by mouth 4 (four) times daily as needed for cough. 240 mL 0   sodium bicarbonate 650 MG tablet Take 650 mg by mouth 2 (two) times daily.     sucralfate  (CARAFATE ) 1 g tablet Take 1 tablet (1 g total) by mouth 3 (three) times daily as needed for up to 60 doses. 60 tablet 0   Syringe/Needle, Disp, (SYRINGE 3CC/25GX1) 25G X 1 3 ML MISC Use to inject into the skin 2x a day 100 each 3   tiZANidine  (ZANAFLEX ) 2 MG tablet Take 1 tablet (2 mg total) by mouth every 8 (eight) hours as needed for muscle spasms. 30 tablet 0   traZODone  (DESYREL ) 50 MG tablet TAKE 0.5 TABLETS BY MOUTH AT BEDTIME AS NEEDED FOR SLEEP. 15 tablet 0   ULTRAM  50 MG tablet Take 50 mg by mouth every 6 (six) hours as needed.     vitamin B-12 (CYANOCOBALAMIN ) 100  MCG tablet Take 100 mcg by mouth daily.     Current Facility-Administered Medications  Medication Dose Route Frequency Provider Last Rate Last Admin   denosumab  (PROLIA ) injection 60 mg  60 mg Subcutaneous Once Jason Leita Repine, FNP        Allergies: Aspirin , Ramipril, and Doxycycline   Past Medical History:  Diagnosis Date   Allergic rhinitis    Anterior chest wall pain    Anxiety    Asthma    Chronic diastolic CHF (congestive heart failure) (HCC)    Echo 01/2020: EF 60-65, normal wall motion, mild LVH, normal RV SF, RVSP 52.6 (moderate elevation), severe LAE, moderate RAE, trivial MR, mild MS (mean gradient 5.5 mmHg), mild aortic valve sclerosis (no AS); elevated E/e' c/w elevated LVEDP   Cough    DM type 2 (diabetes mellitus, type 2) (HCC)    GERD (gastroesophageal reflux disease)    History of diverticulitis of colon    HTN (hypertension)    Hyperlipidemia    IBS (irritable bowel syndrome)    Iron deficiency anemia    Mitral stenosis 04/17/2022   Echo 04/2022: EF 55-60, no RWMA, mild LVH, normal RVSF, normal PASP, moderate LAE, moderate mitral stenosis (mean gradient 4 mmHg), trivial MR, trivial AI, AV sclerosis without stenosis   Osteoporosis, unspecified    Renal insufficiency 07/31/2017   UTI (urinary tract infection)     Past Surgical History:  Procedure Laterality Date   CARDIOVASCULAR STRESS TEST  02/25/04   CORONARY BALLOON ANGIOPLASTY N/A 12/28/2021   Procedure: CORONARY BALLOON ANGIOPLASTY;  Surgeon: Verlin Lonni BIRCH, MD;  Location: [facility];  Service: Cardiovascular;  Laterality: N/A;   CORONARY STENT INTERVENTION N/A 12/28/2021   Procedure: CORONARY STENT INTERVENTION;  Surgeon: Verlin Lonni BIRCH, MD;  Location: [facility];  Service: Cardiovascular;  Laterality: N/A;   ESOPHAGOGASTRODUODENOSCOPY  12/26/01   PACEMAKER IMPLANT N/A 08/02/2020   Procedure: PACEMAKER IMPLANT;  Surgeon: Fernande Elspeth BROCKS, MD;  Location: [facility];   Service: Cardiovascular;  Laterality: N/A;   RIGHT OOPHORECTOMY  jan 2010   RIGHT/LEFT HEART CATH AND CORONARY ANGIOGRAPHY N/A 12/28/2021  Procedure: RIGHT/LEFT HEART CATH AND CORONARY ANGIOGRAPHY;  Surgeon: Verlin Lonni BIRCH, MD;  Location: [facility];  Service: Cardiovascular;  Laterality: N/A;    Family History  Problem Relation Age of Onset   Hypertension Mother    Stroke Mother    Other Father        poor circulation   Cancer Sister    Lung cancer Brother    Melanoma Brother    Diabetes Daughter    Colon cancer Neg Hx    Liver disease Neg Hx    Esophageal cancer Neg Hx     Social History   Tobacco Use   Smoking status: Former    Current packs/day: 0.00    Average packs/day: 0.3 packs/day for 5.0 years (1.5 ttl pk-yrs)    Types: Cigarettes    Start date: 10/02/1981    Quit date: 10/02/1986    Years since quitting: 37.6   Smokeless tobacco: Never  Substance Use Topics   Alcohol use: No    Subjective:   Seen at ER last week with chest pain; felt to be gastritis and combination of CHF; has been feeling better with no further symptoms; is currently taking Lasix  40 mg bid; had follow up with GI yesterday; scheduled to see cardiology early next month;    Objective:  Vitals:   05/13/24 1542  BP: 138/80  Pulse: 69  SpO2: 99%  Weight: 162 lb 9.6 oz (73.8 kg)  Height: 5' 4 (1.626 m)    General: Well developed, well nourished, in no acute distress  Skin : Warm and dry.  Head: Normocephalic and atraumatic  Lungs: Respirations unlabored; clear to auscultation bilaterally without wheeze, rales, rhonchi  CVS exam: normal rate and regular rhythm.  Extremities: No edema, cyanosis, clubbing  Vessels: Symmetric bilaterally  Neurologic: Alert and oriented; speech intact; face symmetrical; moves all extremities well; CNII-XII intact without focal deficit   Assessment:  1. Chronic combined systolic (congestive) and diastolic (congestive) heart failure (HCC)   2.  Pedal edema     Plan:  Update CMP and CXR today; keep planned follow up with cardiology for early September; Did discuss with patient and daughter to verify with both nephrology and cardiology the recommendation for how she should take Lasix ; may want to try alternating 40 mg every day with bid as compromise;   No follow-ups on file.  Orders Placed This Encounter  Procedures   DG Chest 2 View    Standing Status:   Future    Number of Occurrences:   1    Expiration Date:   05/13/2025    Reason for Exam (SYMPTOM  OR DIAGNOSIS REQUIRED):   pedal edema    Preferred imaging location?:   [facility]   Comp Met ([facility])   CBC with Differential/Platelet    Requested Prescriptions    No prescriptions requested or ordered in this encounter

## 2024-05-13 NOTE — Patient Instructions (Signed)

## 2024-05-13 NOTE — Telephone Encounter (Signed)
 Pt had OV 05/13/2024 with PCP.

## 2024-05-14 ENCOUNTER — Telehealth: Payer: Self-pay

## 2024-05-14 ENCOUNTER — Ambulatory Visit: Payer: Self-pay | Admitting: Family

## 2024-05-14 LAB — CBC WITH DIFFERENTIAL/PLATELET
Basophils Absolute: 0 K/uL (ref 0.0–0.1)
Basophils Relative: 0.4 % (ref 0.0–3.0)
Eosinophils Absolute: 0 K/uL (ref 0.0–0.7)
Eosinophils Relative: 0.4 % (ref 0.0–5.0)
HCT: 41.4 % (ref 36.0–46.0)
Hemoglobin: 13.7 g/dL (ref 12.0–15.0)
Lymphocytes Relative: 10.4 % — ABNORMAL LOW (ref 12.0–46.0)
Lymphs Abs: 1 K/uL (ref 0.7–4.0)
MCHC: 33.1 g/dL (ref 30.0–36.0)
MCV: 85.1 fl (ref 78.0–100.0)
Monocytes Absolute: 0.7 K/uL (ref 0.1–1.0)
Monocytes Relative: 7.2 % (ref 3.0–12.0)
Neutro Abs: 7.9 K/uL — ABNORMAL HIGH (ref 1.4–7.7)
Neutrophils Relative %: 81.6 % — ABNORMAL HIGH (ref 43.0–77.0)
Platelets: 137 K/uL — ABNORMAL LOW (ref 150.0–400.0)
RBC: 4.87 Mil/uL (ref 3.87–5.11)
RDW: 17.2 % — ABNORMAL HIGH (ref 11.5–15.5)
WBC: 9.6 K/uL (ref 4.0–10.5)

## 2024-05-14 LAB — COMPREHENSIVE METABOLIC PANEL WITH GFR
ALT: 16 U/L (ref 0–35)
AST: 14 U/L (ref 0–37)
Albumin: 4 g/dL (ref 3.5–5.2)
Alkaline Phosphatase: 72 U/L (ref 39–117)
BUN: 51 mg/dL — ABNORMAL HIGH (ref 6–23)
CO2: 23 meq/L (ref 19–32)
Calcium: 9.6 mg/dL (ref 8.4–10.5)
Chloride: 100 meq/L (ref 96–112)
Creatinine, Ser: 2.79 mg/dL — ABNORMAL HIGH (ref 0.40–1.20)
GFR: 14.94 mL/min — CL (ref >60.00)
Glucose, Bld: 326 mg/dL — ABNORMAL HIGH (ref 70–99)
Potassium: 4.9 meq/L (ref 3.5–5.1)
Sodium: 137 meq/L (ref 135–145)
Total Bilirubin: 0.6 mg/dL (ref 0.2–1.2)
Total Protein: 6.8 g/dL (ref 6.0–8.3)

## 2024-05-14 NOTE — Telephone Encounter (Signed)
 Noted. Reviewed chart. She does follow w nephro. Slightly lower GFR compared to prior. Monitor weights. Definitely needs to coordinate care with them and cards to optimize diuretic.

## 2024-05-14 NOTE — Telephone Encounter (Signed)
 Copied from CRM [id number]. Topic: Clinical - Lab/Test Results >> May 14, 2024  2:37 PM Chiquita SQUIBB wrote: Reason for CRM: Patients daughter is calling in for the xray results, she stated she forgot to ask when discussing the lab results.

## 2024-05-14 NOTE — Telephone Encounter (Signed)
 CRITICAL VALUE STICKER  CRITICAL VALUE: GFR-[id number]

## 2024-05-15 NOTE — Telephone Encounter (Signed)
 Spoke with pts daughter, advised pts daughter we are still waiting for the Xray to be read by radiology. Pt is aware and expressed understanding.

## 2024-05-16 ENCOUNTER — Ambulatory Visit: Payer: 59 | Admitting: Family

## 2024-05-19 DIAGNOSIS — E785 Hyperlipidemia, unspecified: Secondary | ICD-10-CM | POA: Diagnosis not present

## 2024-05-19 DIAGNOSIS — E872 Acidosis, unspecified: Secondary | ICD-10-CM | POA: Diagnosis not present

## 2024-05-19 DIAGNOSIS — D509 Iron deficiency anemia, unspecified: Secondary | ICD-10-CM | POA: Diagnosis not present

## 2024-05-19 DIAGNOSIS — N2581 Secondary hyperparathyroidism of renal origin: Secondary | ICD-10-CM | POA: Diagnosis not present

## 2024-05-19 DIAGNOSIS — I129 Hypertensive chronic kidney disease with stage 1 through stage 4 chronic kidney disease, or unspecified chronic kidney disease: Secondary | ICD-10-CM | POA: Diagnosis not present

## 2024-05-19 DIAGNOSIS — I5032 Chronic diastolic (congestive) heart failure: Secondary | ICD-10-CM | POA: Diagnosis not present

## 2024-05-31 ENCOUNTER — Other Ambulatory Visit: Payer: Self-pay | Admitting: Family

## 2024-06-02 ENCOUNTER — Other Ambulatory Visit: Payer: Self-pay | Admitting: Internal Medicine

## 2024-06-02 ENCOUNTER — Other Ambulatory Visit: Payer: Self-pay | Admitting: "Endocrinology

## 2024-06-02 ENCOUNTER — Other Ambulatory Visit: Payer: Self-pay | Admitting: Family

## 2024-06-02 DIAGNOSIS — N183 Chronic kidney disease, stage 3 unspecified: Secondary | ICD-10-CM

## 2024-06-02 NOTE — Progress Notes (Unsigned)
 Cardiology Office Note   Date:  06/06/2024   ID:  [name], DOB 10-17-1935, MRN [id number]  PCP:  Jason Leita Repine, FNP  Cardiologist:   Vina Gull, MD   F/U of PAF, CAD, HTN and mitral stenosis     History of Present Illness: [name] is a 88 y.o. female with a history of CAD, HFpEF, PAF HTN, mitral stenosis   DM,  2017  CT scan for afib showed Ca score of 51  mod dz LAD; filling defect in LAA),PAF 2021 Pt hospitalized for dyspnea.  Found to be in CHB   She underwent PPM implant, followed by GORMAN Sage    12/28/21 Admitted with NSTEMI.  She underwent  PTCA/ DES to the  LAD,probably lost her diagonal. LVEF was 30-35%,  Hospital course was  complicated by AKI,    I saw the pt in March   She was seen  by NP in May 2025 She was seen in ED for epigastric discomfort.  Felt to be GI.  Told to make an appointment with cardiology.  She was sent home with Carafate  and Maalox.  Also told to increase her Lasix  to 40 twice daily.[name]  Her PCP/renal doctor have told her to cut back to 40 daily. Since seen, the patient says she has had no further epigastric discomfort.  She denies other chest pains.  Discomfort she had in August was not like her previous angina.  Her breathing is okay.  She is sleeping okay.  No PND.  No palpitations.  No dizziness.  No edema   Current Meds  Medication Sig   acetaminophen  (TYLENOL ) 325 MG tablet Take 650 mg by mouth every 6 (six) hours as needed for pain.   albuterol  (PROAIR  HFA) 108 (90 Base) MCG/ACT inhaler 2 puffs every 4 hours as needed only  if your can't catch your breath   albuterol  (PROVENTIL ) (2.5 MG/3ML) 0.083% nebulizer solution Take 3 mLs (2.5 mg total) by nebulization every 6 (six) hours as needed for wheezing or shortness of breath.   amoxicillin -clavulanate (AUGMENTIN ) 875-125 MG tablet Take 1 tablet by mouth 2 (two) times daily.   apixaban  (ELIQUIS ) 2.5 MG TABS tablet TAKE 1 TABLET BY MOUTH TWICE A DAY   Ascorbic Acid (VITAMIN C) 1000 MG  tablet Take 1,000 mg by mouth daily.   atorvastatin  (LIPITOR ) 80 MG tablet TAKE 1 TABLET BY MOUTH EVERY DAY   Azelastine  HCl 137 MCG/SPRAY SOLN PLACE 2 SPRAYS INTO BOTH NOSTRILS 2 (TWO) TIMES DAILY AS NEEDED FOR ALLERGIES. USE IN EACH NOSTRIL AS DIRECTED   BD VEO INSULIN  SYRINGE U/F 31G X 15/64 0.5 ML MISC Use as instructed   Blood Glucose Monitoring Suppl (ONETOUCH VERIO FLEX SYSTEM) w/Device KIT USE AS DIRECTED   budesonide -formoterol  (SYMBICORT ) 160-4.5 MCG/ACT inhaler TAKE 2 PUFFS FIRST THING IN MORNING AND THEN ANOTHER 2 PUFFS ABOUT 12 HOURS LATER.   carvedilol  (COREG ) 3.125 MG tablet TAKE 1 TABLET BY MOUTH TWICE A DAY WITH FOOD   cetirizine  (ZYRTEC  ALLERGY ) 10 MG tablet Take 1 tablet (10 mg total) by mouth daily.   cholecalciferol (VITAMIN D3) 25 MCG (1000 UNIT) tablet Take 1,000 Units by mouth daily. Isn't taking regularly   clopidogrel  (PLAVIX ) 75 MG tablet Take 1 tablet (75 mg total) by mouth daily with breakfast.   diclofenac  Sodium (VOLTAREN ) 1 % GEL Apply 4 g topically 4 (four) times daily as needed (pain).   famotidine  (PEPCID ) 20 MG tablet TAKE 1 TABLET BY MOUTH TWICE A DAY  FARXIGA  5 MG TABS tablet TAKE 1 TABLET BY MOUTH EVERY DAY BEFORE BREAKFAST   fluticasone  (FLONASE ) 50 MCG/ACT nasal spray SPRAY 2 SPRAYS INTO EACH NOSTRIL EVERY DAY   furosemide  (LASIX ) 40 MG tablet TAKE 1 TABLET BY MOUTH TWICE A DAY   Glucagon  (BAQSIMI  ONE PACK) 3 MG/DOSE POWD Place 1 Device into the nose as needed (Low blood sugar with impaired consciousness).   glucose blood (ONETOUCH VERIO) test strip 1 each by Other route 2 (two) times daily. And lancets 2/day   glucose blood test strip Check blood sugar twice a day   hydrALAZINE  (APRESOLINE ) 25 MG tablet Take 3 tablets (75 mg total) by mouth 3 (three) times daily.   insulin  lispro (HUMALOG ) 100 UNIT/ML injection GIVE 3 UNITS WITH BREAKFAST, AND 18 UNITS WITH SUPPER   latanoprost  (XALATAN ) 0.005 % ophthalmic solution Place 1 drop into both eyes at  bedtime.   lidocaine  (LIDODERM ) 5 % Place 1 patch onto the skin daily. Remove & Discard patch within 12 hours or as directed by MD   lubiprostone  (AMITIZA ) 8 MCG capsule as needed.   methocarbamol (ROBAXIN) 500 MG tablet Take 500 mg by mouth 4 (four) times daily.   methylPREDNISolone  (MEDROL  DOSEPAK) 4 MG TBPK tablet Take with signs of chronic sinusitis and take as directed   montelukast  (SINGULAIR ) 10 MG tablet Take 1 tablet (10 mg total) by mouth at bedtime.   nitroGLYCERIN  (NITROSTAT ) 0.4 MG SL tablet Place 1 tablet (0.4 mg total) under the tongue every 5 (five) minutes x 3 doses as needed for chest pain.   ondansetron  (ZOFRAN ) 8 MG tablet TAKE 1 TABLET BY MOUTH EVERY 8 HOURS AS NEEDED FOR NAUSEA OR VOMITING.   pantoprazole  (PROTONIX ) 40 MG tablet Take 1 tablet (40 mg total) by mouth daily.   potassium chloride  (KLOR-CON ) 10 MEQ tablet TAKE 1 TABLET BY MOUTH EVERY DAY   sucralfate  (CARAFATE ) 1 g tablet Take 1 tablet (1 g total) by mouth 3 (three) times daily as needed for up to 60 doses.   Syringe/Needle, Disp, (SYRINGE 3CC/25GX1) 25G X 1 3 ML MISC Use to inject into the skin 2x a day   tiZANidine  (ZANAFLEX ) 2 MG tablet Take 1 tablet (2 mg total) by mouth every 8 (eight) hours as needed for muscle spasms.   traZODone  (DESYREL ) 50 MG tablet TAKE 0.5 TABLETS BY MOUTH AT BEDTIME AS NEEDED FOR SLEEP.   ULTRAM  50 MG tablet Take 50 mg by mouth every 6 (six) hours as needed.   vitamin B-12 (CYANOCOBALAMIN ) 100 MCG tablet Take 100 mcg by mouth daily.   Current Facility-Administered Medications for the 06/06/24 encounter (Office Visit) with Okey Vina GAILS, MD  Medication   denosumab  (PROLIA ) injection 60 mg        Allergies:   Aspirin , Ramipril, and Doxycycline    Past Medical History:  Diagnosis Date   Allergic rhinitis    Anterior chest wall pain    Anxiety    Asthma    Chronic diastolic CHF (congestive heart failure) (HCC)    Echo 01/2020: EF 60-65, normal wall motion, mild LVH, normal RV  SF, RVSP 52.6 (moderate elevation), severe LAE, moderate RAE, trivial MR, mild MS (mean gradient 5.5 mmHg), mild aortic valve sclerosis (no AS); elevated E/e' c/w elevated LVEDP   Cough    DM type 2 (diabetes mellitus, type 2) (HCC)    GERD (gastroesophageal reflux disease)    History of diverticulitis of colon    HTN (hypertension)    Hyperlipidemia  IBS (irritable bowel syndrome)    Iron deficiency anemia    Mitral stenosis 04/17/2022   Echo 04/2022: EF 55-60, no RWMA, mild LVH, normal RVSF, normal PASP, moderate LAE, moderate mitral stenosis (mean gradient 4 mmHg), trivial MR, trivial AI, AV sclerosis without stenosis   Osteoporosis, unspecified    Renal insufficiency 07/31/2017   UTI (urinary tract infection)     Past Surgical History:  Procedure Laterality Date   CARDIOVASCULAR STRESS TEST  02/25/04   CORONARY BALLOON ANGIOPLASTY N/A 12/28/2021   Procedure: CORONARY BALLOON ANGIOPLASTY;  Surgeon: Verlin Lonni BIRCH, MD;  Location: [facility];  Service: Cardiovascular;  Laterality: N/A;   CORONARY STENT INTERVENTION N/A 12/28/2021   Procedure: CORONARY STENT INTERVENTION;  Surgeon: Verlin Lonni BIRCH, MD;  Location: [facility];  Service: Cardiovascular;  Laterality: N/A;   ESOPHAGOGASTRODUODENOSCOPY  12/26/01   PACEMAKER IMPLANT N/A 08/02/2020   Procedure: PACEMAKER IMPLANT;  Surgeon: Fernande Elspeth BROCKS, MD;  Location: [facility] INVASIVE CV LAB;  Service: Cardiovascular;  Laterality: N/A;   RIGHT OOPHORECTOMY  jan 2010   RIGHT/LEFT HEART CATH AND CORONARY ANGIOGRAPHY N/A 12/28/2021   Procedure: RIGHT/LEFT HEART CATH AND CORONARY ANGIOGRAPHY;  Surgeon: Verlin Lonni BIRCH, MD;  Location: [facility];  Service: Cardiovascular;  Laterality: N/A;     Social History:  The patient  reports that she quit smoking about 37 years ago. Her smoking use included cigarettes. She started smoking about 42 years ago. She has a 1.5 pack-year smoking history. She has never used  smokeless tobacco. She reports that she does not drink alcohol and does not use drugs.   Family History:  The patient's family history includes Cancer in her sister; Diabetes in her daughter; Hypertension in her mother; Lung cancer in her brother; Melanoma in her brother; Other in her father; Stroke in her mother.    ROS:  Please see the history of present illness. All other systems are reviewed and  Negative to the above problem except as noted.    PHYSICAL EXAM: VS:  BP 120/70   Pulse 69   Ht 5' 6 (1.676 m)   Wt 163 lb 14.4 oz (74.3 kg)   SpO2 98%   BMI 26.45 kg/m   GEN:  Overweight 88 yo  in no acute distress HEENT: normal  Neck: JVP not elevated  Cardiac: RRR grade 1/6 to 2/6 systolic murmur heard best at the left sternal border.  Grade 1/6 diastolic rumble heard best at the apex.  Respiratory:  clear to auscultation GI: soft, nontenderNo hepatomegaly  Extremities: No lower extremity edema   EKG:  EKG is not done today   Echo  AUg 2025 1. Left ventricular ejection fraction, by estimation, is 55%. The left  ventricle has normal function. The left ventricle has no regional wall  motion abnormalities. There is mild left ventricular hypertrophy. Left  ventricular diastolic parameters are  indeterminate.   2. Right ventricular systolic function is mildly reduced. The right  ventricular size is normal. There is mildly elevated pulmonary artery  systolic pressure. The estimated right ventricular systolic pressure is  36.4 mmHg.   3. Left atrial size was severely dilated.   4. Right atrial size was moderately dilated.   5. The mitral valve is degenerative. Trivial mitral valve regurgitation.  Moderate mitral stenosis. The mean mitral valve gradient is 5.0 mmHg with  average heart rate of 60 bpm. Severe mitral annular calcification.   6. The aortic valve is tricuspid. There is moderate  calcification of the  aortic valve. Aortic valve regurgitation is trivial. Aortic valve   sclerosis/calcification is present, without any evidence of aortic  stenosis.   7. The inferior vena cava is normal in size with greater than 50%  respiratory variability, suggesting right atrial pressure of 3 mmHg.   R and L heart cath  12/28/21    Mid RCA lesion is 30% stenosed.   Dist RCA lesion is 50% stenosed.   1st Diag lesion is 90% stenosed.   Prox LAD to Mid LAD lesion is 95% stenosed.   A drug-eluting stent was successfully placed using a SYNERGY XD 3.0X16.   Balloon angioplasty was performed using a BALLN SAPPHIRE 2.0X12.   Post intervention, there is a 0% residual stenosis.   Post intervention, there is a 30% residual stenosis.   Severe proximal LAD stenosis involving a moderate caliber diagonal branch.  Successful PTCA/DES x 1 proximal LAD Successful balloon angioplasty Diagonal 1 No obstructive disease in the Circumflex artery Moderate non-obstructive disease in the dominant RCA Elevated right and left heart pressures.    Recommendations: Continue DAPT with ASA and Plavix  for one month and can then stop ASA since she will also be on Eliquis . She remains volume overload. Will need continued diuresis with caution given renal insufficiency    Lipid Panel    Component Value Date/Time   CHOL 105 02/05/2023 0921   CHOL 135 07/28/2021 0852   TRIG 89.0 02/05/2023 0921   TRIG 83 08/07/2006 1419   HDL 47.00 02/05/2023 0921   HDL 64 07/28/2021 0852   CHOLHDL 2 02/05/2023 0921   VLDL 17.8 02/05/2023 0921   LDLCALC 41 02/05/2023 0921   LDLCALC 58 07/28/2021 0852      Wt Readings from Last 3 Encounters:  06/06/24 163 lb 14.4 oz (74.3 kg)  05/13/24 162 lb 9.6 oz (73.8 kg)  04/28/24 166 lb 2 oz (75.4 kg)      ASSESSMENT AND PLAN:  1  CAD  2023  Pt s/p NSTEMI  with intervention to LAD.     Remains on Plavix    I am not convinced of active angina.  I do not think episode in August was cardiac.  2  Hx Atrial fibrillation   Permanent   Continue ELiquis  patient  asymptomatic.  3  PPM  Hx of CHB  Follows  with EP    4   MV dz   mitral stenosis remains moderate by echo back in August of this year.  LVEF is normal.  Continue to follow    5 history of HFpEF  Volume status remains good   6    HTN blood pressure well-controlled  7 HL   LDL is 41, HDL 47.  (May 2024.  8.   Renal  Follows in renal clinic  Last Cr 2.0   9.  Diabetes.  Last A1c 7.4.  Reviewed diet.  Limit simple carbs, sugars.  Plan for follow-up later this winter. Signed, Vina Gull, MD  06/06/2024 8:33 AM    [facility] [address] [place], [place], [place]  [address] Phone: [phone]; Fax: [phone]

## 2024-06-03 DIAGNOSIS — H401131 Primary open-angle glaucoma, bilateral, mild stage: Secondary | ICD-10-CM | POA: Diagnosis not present

## 2024-06-03 LAB — HM DIABETES EYE EXAM

## 2024-06-03 NOTE — Telephone Encounter (Signed)
 Requested Prescriptions   Pending Prescriptions Disp Refills   FARXIGA  5 MG TABS tablet [Pharmacy Med Name: FARXIGA  5 MG TABLET] 30 tablet 3    Sig: TAKE 1 TABLET BY MOUTH EVERY DAY BEFORE BREAKFAST   insulin  lispro (HUMALOG ) 100 UNIT/ML injection [Pharmacy Med Name: HUMALOG  100 UNIT/ML VIAL] 10 mL 11    Sig: GIVE 3 UNITS WITH BREAKFAST, AND 18 UNITS WITH SUPPER

## 2024-06-06 ENCOUNTER — Other Ambulatory Visit: Payer: Self-pay | Admitting: Family

## 2024-06-06 ENCOUNTER — Telehealth: Payer: Self-pay | Admitting: Family

## 2024-06-06 ENCOUNTER — Ambulatory Visit: Attending: Internal Medicine | Admitting: Internal Medicine

## 2024-06-06 ENCOUNTER — Ambulatory Visit: Admitting: Internal Medicine

## 2024-06-06 ENCOUNTER — Encounter: Payer: Self-pay | Admitting: Internal Medicine

## 2024-06-06 VITALS — BP 120/70 | HR 69 | Ht 66.0 in | Wt 163.9 lb

## 2024-06-06 DIAGNOSIS — I050 Rheumatic mitral stenosis: Secondary | ICD-10-CM

## 2024-06-06 MED ORDER — FUROSEMIDE 40 MG PO TABS: 40.0000 mg | ORAL_TABLET | Freq: Every day | ORAL | Status: DC

## 2024-06-06 MED ORDER — BUTALBITAL-APAP-CAFFEINE 50-325-40 MG PO TABS: 1.0000 | ORAL_TABLET | Freq: Four times a day (QID) | ORAL | 0 refills | Status: DC | PRN

## 2024-06-06 NOTE — Patient Instructions (Addendum)
 Medication Instructions:  Your physician recommends that you continue on your current medications as directed. Please refer to the Current Medication list given to you today.  *If you need a refill on your cardiac medications before your next appointment, please call your pharmacy*  Follow-Up: At [facility], you and your health needs are our priority.  As part of our continuing mission to provide you with exceptional heart care, our providers are all part of one team.  This team includes your primary Cardiologist (physician) and Advanced Practice Providers or APPs (Physician Assistants and Nurse Practitioners) who all work together to provide you with the care you need, when you need it.  Your next appointment:   4 month(s) (call in November to scheduled appointment for January 2026)  Provider:   Vina Gull, MD

## 2024-06-06 NOTE — Telephone Encounter (Signed)
 Copied from CRM #[id number]. Topic: Clinical - Medication Refill >> Jun 06, 2024 12:24 PM Robinson H wrote: Medication: Butalb 50-325-40 Medication not listed  Has the patient contacted their pharmacy? Yes, has to reach out to provider (Agent: If no, request that the patient contact the pharmacy for the refill. If patient does not wish to contact the pharmacy document the reason why and proceed with request.) (Agent: If yes, when and what did the pharmacy advise?)  This is the patient's preferred pharmacy:  [id number] [place], [place] - [address] [place] [address] Phone: [phone] Fax: [phone]  Is this the correct pharmacy for this prescription? Yes If no, delete pharmacy and type the correct one.   Has the prescription been filled recently? No  Is the patient out of the medication? Yes  Has the patient been seen for an appointment in the last year OR does the patient have an upcoming appointment? Yes  Can we respond through MyChart? No  Agent: Please be advised that Rx refills may take up to 3 business days. We ask that you follow-up with your pharmacy.

## 2024-06-13 ENCOUNTER — Encounter: Payer: Self-pay | Admitting: Family

## 2024-06-13 ENCOUNTER — Ambulatory Visit (INDEPENDENT_AMBULATORY_CARE_PROVIDER_SITE_OTHER): Admitting: Family

## 2024-06-13 VITALS — BP 118/64 | HR 70 | Ht 66.0 in | Wt 163.0 lb

## 2024-06-13 DIAGNOSIS — M542 Cervicalgia: Secondary | ICD-10-CM

## 2024-06-13 MED ORDER — TIZANIDINE HCL 2 MG PO TABS: 2.0000 mg | ORAL_TABLET | Freq: Three times a day (TID) | ORAL | 0 refills | Status: DC | PRN

## 2024-06-13 NOTE — Progress Notes (Signed)
 [name] is a 88 y.o. female with the following history as recorded in [employer]:  Patient Active Problem List   Diagnosis Date Noted   Acute bronchitis 05/03/2023   Acute bacterial rhinosinusitis 05/03/2023   Osteoarthritis of right knee 07/04/2022   CKD (chronic kidney disease) stage 4, GFR 15-29 ml/min (HCC) 06/27/2022   Mitral stenosis 04/17/2022   Acute kidney injury superimposed on chronic kidney disease (HCC) 01/02/2022   NSTEMI (non-ST elevated myocardial infarction) (HCC) 12/23/2021   Osteoarthritis of knee 03/30/2021   Bilateral hand pain 03/08/2021   Complete heart block (HCC) 12/13/2020   Pacemaker - MDT 12/13/2020   DOE (dyspnea on exertion) 07/29/2020   Encntr for surgical aftcr following surgery on the circ sys 07/26/2020   Hypertensive heart and chronic kidney disease with heart failure and stage 1 through stage 4 chronic kidney disease, or unspecified chronic kidney disease (HCC) 07/26/2020   Pain in right knee 03/17/2020   Chronic combined systolic (congestive) and diastolic (congestive) heart failure (HCC)    DKA (diabetic ketoacidosis) (HCC) 12/20/2019   Diverticulosis 12/10/2019   Age-related osteoporosis without current pathological fracture 10/03/2019   Chronic kidney disease, stage 3 unspecified (HCC) 10/03/2019   Long term (current) use of insulin  (HCC) 10/03/2019   Hyperlipidemia, unspecified 10/03/2019   Long term (current) use of inhaled steroids 10/03/2019   Shortness of breath 01/30/2018   Acute bronchiolitis 12/17/2017   Wheezing 10/29/2017   Vitamin D  deficiency 07/31/2017   Renal insufficiency 07/31/2017   Knee pain 06/01/2017   Morbid obesity due to excess calories (HCC) 01/25/2017   Diabetes (HCC) 04/12/2016   Cough 01/20/2015   Atrial fibrillation (HCC) 05/28/2014   History of colonic polyps 05/28/2014   Vomiting 01/29/2014   CAD (coronary artery disease) 09/01/2013   Routine general medical examination at a health care facility  07/17/2011   Encounter for long-term (current) use of other medications 07/06/2011   Nonspecific (abnormal) findings on radiological and other examination of body structure 11/09/2009   IRRITABLE BOWEL SYNDROME 05/07/2009   CHEST PAIN 05/07/2009   ADNEXAL MASS, RIGHT 07/22/2008   HEMORRHOIDS, RECURRENT 01/27/2008   Diverticulitis 01/27/2008   OTH ABNORMAL FIND RAD EXAMINATION BREAST 01/27/2008   GERD 01/13/2008   UTI 09/28/2007   Dyslipidemia 05/27/2007   ANEMIA-IRON DEFICIENCY 05/27/2007   ANXIETY 05/27/2007   Essential hypertension 05/27/2007   Seasonal allergic rhinitis 05/27/2007   Cough variant asthma 05/27/2007   Osteoporosis 05/27/2007   DIVERTICULITIS, HX OF 05/27/2007    Current Outpatient Medications  Medication Sig Dispense Refill   acetaminophen  (TYLENOL ) 325 MG tablet Take 650 mg by mouth every 6 (six) hours as needed for pain.     albuterol  (PROAIR  HFA) 108 (90 Base) MCG/ACT inhaler 2 puffs every 4 hours as needed only  if your can't catch your breath 18 g 11   albuterol  (PROVENTIL ) (2.5 MG/3ML) 0.083% nebulizer solution Take 3 mLs (2.5 mg total) by nebulization every 6 (six) hours as needed for wheezing or shortness of breath. 150 mL 1   amoxicillin -clavulanate (AUGMENTIN ) 875-125 MG tablet Take 1 tablet by mouth 2 (two) times daily. 20 tablet 0   apixaban  (ELIQUIS ) 2.5 MG TABS tablet TAKE 1 TABLET BY MOUTH TWICE A DAY 60 tablet 6   Ascorbic Acid (VITAMIN C) 1000 MG tablet Take 1,000 mg by mouth daily.     atorvastatin  (LIPITOR ) 80 MG tablet TAKE 1 TABLET BY MOUTH EVERY DAY 90 tablet 3   Azelastine  HCl 137 MCG/SPRAY SOLN PLACE 2 SPRAYS  INTO BOTH NOSTRILS 2 (TWO) TIMES DAILY AS NEEDED FOR ALLERGIES. USE IN EACH NOSTRIL AS DIRECTED 90 mL 1   BD VEO INSULIN  SYRINGE U/F 31G X 15/64 0.5 ML MISC Use as instructed 100 each 3   Blood Glucose Monitoring Suppl (ONETOUCH VERIO FLEX SYSTEM) w/Device KIT USE AS DIRECTED 1 kit .   budesonide -formoterol  (SYMBICORT ) 160-4.5 MCG/ACT  inhaler TAKE 2 PUFFS FIRST THING IN MORNING AND THEN ANOTHER 2 PUFFS ABOUT 12 HOURS LATER. 30.6 each 6   butalbital -acetaminophen -caffeine  (FIORICET) 50-325-40 MG tablet Take 1 tablet by mouth every 6 (six) hours as needed for headache. 20 tablet 0   carvedilol  (COREG ) 3.125 MG tablet TAKE 1 TABLET BY MOUTH TWICE A DAY WITH FOOD 180 tablet 2   cetirizine  (ZYRTEC  ALLERGY ) 10 MG tablet Take 1 tablet (10 mg total) by mouth daily. 30 tablet 5   cholecalciferol (VITAMIN D3) 25 MCG (1000 UNIT) tablet Take 1,000 Units by mouth daily. Isn't taking regularly     clopidogrel  (PLAVIX ) 75 MG tablet Take 1 tablet (75 mg total) by mouth daily with breakfast. 90 tablet 3   diclofenac  Sodium (VOLTAREN ) 1 % GEL Apply 4 g topically 4 (four) times daily as needed (pain). 150 g 0   famotidine  (PEPCID ) 20 MG tablet TAKE 1 TABLET BY MOUTH TWICE A DAY 180 tablet 2   FARXIGA  5 MG TABS tablet TAKE 1 TABLET BY MOUTH EVERY DAY BEFORE BREAKFAST 30 tablet 3   fluticasone  (FLONASE ) 50 MCG/ACT nasal spray SPRAY 2 SPRAYS INTO EACH NOSTRIL EVERY DAY 48 mL 1   furosemide  (LASIX ) 40 MG tablet Take 1 tablet (40 mg total) by mouth daily.     Glucagon  (BAQSIMI  ONE PACK) 3 MG/DOSE POWD Place 1 Device into the nose as needed (Low blood sugar with impaired consciousness). 2 each 3   glucose blood (ONETOUCH VERIO) test strip 1 each by Other route 2 (two) times daily. And lancets 2/day 200 each 3   glucose blood test strip Check blood sugar twice a day 100 each 12   hydrALAZINE  (APRESOLINE ) 25 MG tablet Take 3 tablets (75 mg total) by mouth 3 (three) times daily. 810 tablet 3   insulin  lispro (HUMALOG ) 100 UNIT/ML injection GIVE 3 UNITS WITH BREAKFAST, AND 18 UNITS WITH SUPPER 10 mL 11   latanoprost  (XALATAN ) 0.005 % ophthalmic solution Place 1 drop into both eyes at bedtime.     lidocaine  (LIDODERM ) 5 % Place 1 patch onto the skin daily. Remove & Discard patch within 12 hours or as directed by MD 30 patch 0   lubiprostone  (AMITIZA ) 8 MCG  capsule as needed.     methocarbamol (ROBAXIN) 500 MG tablet Take 500 mg by mouth 4 (four) times daily.     methylPREDNISolone  (MEDROL  DOSEPAK) 4 MG TBPK tablet Take with signs of chronic sinusitis and take as directed 1 each 1   montelukast  (SINGULAIR ) 10 MG tablet Take 1 tablet (10 mg total) by mouth at bedtime. 90 tablet 3   nitroGLYCERIN  (NITROSTAT ) 0.4 MG SL tablet Place 1 tablet (0.4 mg total) under the tongue every 5 (five) minutes x 3 doses as needed for chest pain. 25 tablet 2   ondansetron  (ZOFRAN ) 8 MG tablet TAKE 1 TABLET BY MOUTH EVERY 8 HOURS AS NEEDED FOR NAUSEA OR VOMITING. 18 tablet 0   pantoprazole  (PROTONIX ) 40 MG tablet Take 1 tablet (40 mg total) by mouth daily. 180 tablet 3   potassium chloride  (KLOR-CON ) 10 MEQ tablet TAKE 1 TABLET BY MOUTH EVERY DAY  90 tablet 2   sucralfate  (CARAFATE ) 1 g tablet Take 1 tablet (1 g total) by mouth 3 (three) times daily as needed for up to 60 doses. 60 tablet 0   Syringe/Needle, Disp, (SYRINGE 3CC/25GX1) 25G X 1 3 ML MISC Use to inject into the skin 2x a day 100 each 3   tiZANidine  (ZANAFLEX ) 2 MG tablet Take 1 tablet (2 mg total) by mouth every 8 (eight) hours as needed for muscle spasms. 30 tablet 0   tiZANidine  (ZANAFLEX ) 2 MG tablet Take 1 tablet (2 mg total) by mouth every 8 (eight) hours as needed for muscle spasms. 30 tablet 0   traZODone  (DESYREL ) 50 MG tablet TAKE 0.5 TABLETS BY MOUTH AT BEDTIME AS NEEDED FOR SLEEP. 15 tablet 0   ULTRAM  50 MG tablet Take 50 mg by mouth every 6 (six) hours as needed.     vitamin B-12 (CYANOCOBALAMIN ) 100 MCG tablet Take 100 mcg by mouth daily.     Current Facility-Administered Medications  Medication Dose Route Frequency Provider Last Rate Last Admin   denosumab  (PROLIA ) injection 60 mg  60 mg Subcutaneous Once Jason Leita Repine, FNP        Allergies: Aspirin , Ramipril, and Doxycycline   Past Medical History:  Diagnosis Date   Allergic rhinitis    Anterior chest wall pain    Anxiety     Asthma    Chronic diastolic CHF (congestive heart failure) (HCC)    Echo 01/2020: EF 60-65, normal wall motion, mild LVH, normal RV SF, RVSP 52.6 (moderate elevation), severe LAE, moderate RAE, trivial MR, mild MS (mean gradient 5.5 mmHg), mild aortic valve sclerosis (no AS); elevated E/e' c/w elevated LVEDP   Cough    DM type 2 (diabetes mellitus, type 2) (HCC)    GERD (gastroesophageal reflux disease)    History of diverticulitis of colon    HTN (hypertension)    Hyperlipidemia    IBS (irritable bowel syndrome)    Iron deficiency anemia    Mitral stenosis 04/17/2022   Echo 04/2022: EF 55-60, no RWMA, mild LVH, normal RVSF, normal PASP, moderate LAE, moderate mitral stenosis (mean gradient 4 mmHg), trivial MR, trivial AI, AV sclerosis without stenosis   Osteoporosis, unspecified    Renal insufficiency 07/31/2017   UTI (urinary tract infection)     Past Surgical History:  Procedure Laterality Date   CARDIOVASCULAR STRESS TEST  02/25/04   CORONARY BALLOON ANGIOPLASTY N/A 12/28/2021   Procedure: CORONARY BALLOON ANGIOPLASTY;  Surgeon: Verlin Lonni BIRCH, MD;  Location: [facility];  Service: Cardiovascular;  Laterality: N/A;   CORONARY STENT INTERVENTION N/A 12/28/2021   Procedure: CORONARY STENT INTERVENTION;  Surgeon: Verlin Lonni BIRCH, MD;  Location: [facility];  Service: Cardiovascular;  Laterality: N/A;   ESOPHAGOGASTRODUODENOSCOPY  12/26/01   PACEMAKER IMPLANT N/A 08/02/2020   Procedure: PACEMAKER IMPLANT;  Surgeon: Fernande Elspeth BROCKS, MD;  Location: [facility];  Service: Cardiovascular;  Laterality: N/A;   RIGHT OOPHORECTOMY  jan 2010   RIGHT/LEFT HEART CATH AND CORONARY ANGIOGRAPHY N/A 12/28/2021   Procedure: RIGHT/LEFT HEART CATH AND CORONARY ANGIOGRAPHY;  Surgeon: Verlin Lonni BIRCH, MD;  Location: [facility];  Service: Cardiovascular;  Laterality: N/A;    Family History  Problem Relation Age of Onset   Hypertension Mother    Stroke Mother     Other Father        poor circulation   Cancer Sister    Lung cancer Brother    Melanoma Brother  Diabetes Daughter    Colon cancer Neg Hx    Liver disease Neg Hx    Esophageal cancer Neg Hx     Social History   Tobacco Use   Smoking status: Former    Current packs/day: 0.00    Average packs/day: 0.3 packs/day for 5.0 years (1.5 ttl pk-yrs)    Types: Cigarettes    Start date: 10/02/1981    Quit date: 10/02/1986    Years since quitting: 37.7   Smokeless tobacco: Never  Substance Use Topics   Alcohol use: No    Subjective:   Accompanied by daughter; no acute concerns today; wanted to touch base one more time before PCP leaves office; continuing to work with specialists including cardiology, pulmonology, nephrology and GI;  Would like refill on muscle relaxer to help with chronic neck pain;     Objective:  Vitals:   06/13/24 0822  BP: 118/64  Pulse: 70  SpO2: 99%  Weight: 163 lb (73.9 kg)  Height: 5' 6 (1.676 m)    General: Well developed, well nourished, in no acute distress  Skin : Warm and dry.  Head: Normocephalic and atraumatic  Lungs: Respirations unlabored;  CVS exam: normal rate and regular rhythm.  Neurologic: Alert and oriented; speech intact; face symmetrical; moves all extremities well; CNII-XII intact without focal deficit   Assessment:  1. Neck pain     Plan:  Rx for Tizanidine  to use as needed; encouraged to follow up with her orthopedist for follow up.   No follow-ups on file.  No orders of the defined types were placed in this encounter.   Requested Prescriptions   Signed Prescriptions Disp Refills   tiZANidine  (ZANAFLEX ) 2 MG tablet 30 tablet 0    Sig: Take 1 tablet (2 mg total) by mouth every 8 (eight) hours as needed for muscle spasms.

## 2024-06-13 NOTE — Patient Instructions (Signed)
 [phone] [name]) Schedule a TOC with Vickie;

## 2024-06-19 ENCOUNTER — Other Ambulatory Visit: Payer: Self-pay | Admitting: Family

## 2024-06-19 DIAGNOSIS — J209 Acute bronchitis, unspecified: Secondary | ICD-10-CM

## 2024-06-19 DIAGNOSIS — B9689 Other specified bacterial agents as the cause of diseases classified elsewhere: Secondary | ICD-10-CM

## 2024-06-27 NOTE — Progress Notes (Signed)
 06/30/2024 Ronal LITTIE Lodge [id number] [birth year]  Referring provider: Jason Leita Repine,* Primary GI doctor: Dr. Federico  ASSESSMENT AND PLAN:  Nausea without vomiting with history of GERD, dysphagia Can be with smell of foods, some GERD, clears throat a lot, can have issues with even water, does get full quickly EGD 2009 with candidal esophagitis and nonerosive gastritis 02/20/2014 GES normal 12/25/2021 HIDA scan normal 06/22/2023 barium swallow presbyesophagus tertiary contraction corkscrew appearance mid and distal esophagus initially substantial delayed emptying esophagus and stomach small type I hiatal hernia 13 mm barium tablet impacted just above the hiatal hernia Zofran  as needed which helps, she is on pepcid  20 mg at night, not on pantoprazole  - suggest trial of pantoprazole  daily, continue pepcid  - given gastroparesis diet, very suspicious this may be contributing, consider repeat GES -Consider repeat EGD with dilatation but patient is higher risk with comorbid consideration so prefers to wait - will try to maximize bowel regimen - no evidence of yeast on physical exam, no significant dysphagia, consider empiric renal dosing of diflucan  6 week follow up  IBS with constipation 12/18/2019 colonoscopy excellent prep unremarkable other than rectal prolapse 05/02/2024 CTAP W0 cholelithiasis, diverticulosis without diverticulitis small hiatal hernia and large amount of stool throughout the colon Failed Amitiza , caused more nausea - bowel purge then linzess 145 mcg - consider increasing linzess and or trulance  Diabetes with history of DKA Uncontrolled sugars could be contributing to the nausea Follow up endocrinology/PCP  CKD stage IV Kidney function could be contributing to the nausea Follow up endocrinology/PCP  Combined systolic diastolic heart failure/mitral stenosis 04/2024 Echo EF 55%, LVH, mild elevated pulmonary pressure, reduced RVSF, moderate MS  CAD On  Plavix  Would need to hold prior to procedure  A-fib with history of complete heart block On Eliquis  Would need to hold prior to procedure  Patient Care Team: Jason Leita Repine, FNP (Inactive) as PCP - General (Internal Medicine) Okey Vina GAILS, MD as PCP - Cardiology (Cardiology) Luis Purchase, MD as Attending Physician (Gastroenterology) Kay Kemps, MD as Attending Physician (Orthopedic Surgery) Darlean Ozell NOVAK, MD as Attending Physician (Pulmonary Disease) Roz Anes, MD as Consulting Physician (Ophthalmology) Dallie Vera GAILS, MD as Consulting Physician (Obstetrics and Gynecology)  HISTORY OF PRESENT ILLNESS: 88 y.o. female with a past medical history listed below presents for evaluation of nausea without vomiting.   Patient last seen in the office 04/28/2024 by Dr. Federico for left lower quadrant abdominal pain  Discussed the use of AI scribe software for clinical note transcription with the patient, who gave verbal consent to proceed.  History of Present Illness   MALYA CIRILLO is an 88 year old female with diabetes who presents with nausea and abdominal pain.  She experiences persistent nausea and abdominal pain, primarily located in the left lower abdomen. The nausea is sudden and can occur at any time, sometimes triggered by the smell of food, but not consistently associated with eating. No vomiting is present, but there is discomfort in the same area as the pain. Occasional heartburn and throat clearing occur, with difficulty swallowing at times, particularly with water. Ondansetron  provides relief for nausea.  Her abdominal pain and nausea have been ongoing for a long time without significant change, although her appetite has decreased. Pain occurs after bowel movements, which happen every three to four days with the aid of Miralax. She has a history of constipation and was previously tried on Amitiza , which worsened her nausea. No blood in stool, fevers, or  chills are  reported.  Her past medical history includes a CT scan in August showing gallstones, diverticulosis without diverticulitis, a small hiatal hernia, and significant stool burden. A normal gastric emptying study was conducted in 2015, a HIDA scan in 2023, and a barium swallow last year showed abnormal esophageal movement. She is diabetic and uses insulin  for management. She also takes Pepcid  regularly at night and has reduced her use of pantoprazole .  In the review of symptoms, she denies dizziness, vertigo, or migraines. She reports bloating and a change in appetite, feeling full quickly. She has experienced weight loss. She has a history of yeast in the esophagus from 2009, likely related to inhaler use.        She  reports that she quit smoking about 37 years ago. Her smoking use included cigarettes. She started smoking about 42 years ago. She has a 1.5 pack-year smoking history. She has never used smokeless tobacco. She reports that she does not drink alcohol and does not use drugs.  RELEVANT GI HISTORY, IMAGING AND LABS: Results   RADIOLOGY Abdominal CT: Gallstones, diverticulosis, no diverticulitis, small hiatal hernia, significant fecal retention (05/2024)  DIAGNOSTIC Gastric emptying study: Normal (2015) HIDA scan: Normal gallbladder function (2023) Barium swallow: Abnormal esophageal motility, corkscrew appearance, barium tablet retention above hiatal hernia (2024)     Labs 01/2024: CBC with elevated WBC of 13.8. BMP with elevated Cr of 2.28  Gastric emptying study 02/20/14: IMPRESSION:  Normal exam.    HIDA scan 12/25/21: IMPRESSION: Normal hepatobiliary scan, demonstrating patency of both the cystic and common bile ducts.   CT A/P w/o contrast 07/19/22: IMPRESSION: 1. No acute abdominopelvic findings. 2. Colonic diverticulosis without acute diverticulitis. 3. Cholelithiasis without evidence of acute cholecystitis. 4. Punctate nonobstructing left lower pole renal  calculus. 5. Small hiatal hernia. 6. Multichamber cardiomegaly with coronary artery calcifications and aortic valvular and mitral annular calcifications. 7. Similar bibasilar linear atelectasis/scarring with subsegmental mucous plugging and mild bronchiectasis left lower lobe. 8. Aortic Atherosclerosis (ICD10-I70.0).   Chest CT w/o contrast 04/27/23: IMPRESSION: 1. Bronchial wall thickening and mucous plugging in the lower lobes greatest in the left lower lobe. 2. Small hiatal hernia with mild wall thickening of the lower esophagus suggesting esophagitis. 3. Cholelithiasis.   Barium swallow 06/22/23: IMPRESSION: 1. Presbyesophagus with tertiary contractions and corkscrew appearance in the mid and distal esophagus. The contractions and intermittent spasms limit our ability to assess for esophageal narrowings. 2. Initially substantially delayed emptying of the esophagus into the stomach in the upright position. Suspected component of esophageal spasm. Subsequently the esophagus did spontaneously empty into the stomach in the upright position. 3. Small type 1 hiatal hernia. 4. 13 mm barium tablet impacted just above the hiatal hernia.   CT A/P w/o contrast 11/29/23: IMPRESSION: 1. Cholelithiasis. 2. Hepatic cysts. 3. Left vulvar lesion, likely a cyst. This could be assessed further with ultrasound. 4. Aortic atherosclerosis.   EGD 04/02/08:    Colonoscopy 12/18/19: Excellent prep.   CBC    Component Value Date/Time   WBC 9.6 05/13/2024 1617   RBC 4.87 05/13/2024 1617   HGB 13.7 05/13/2024 1617   HGB 13.6 11/08/2023 1402   HCT 41.4 05/13/2024 1617   HCT 40.5 11/08/2023 1402   PLT 137.0 (L) 05/13/2024 1617   PLT 179 11/08/2023 1402   MCV 85.1 05/13/2024 1617   MCV 88 11/08/2023 1402   MCH 28.0 05/07/2024 1845   MCHC 33.1 05/13/2024 1617   RDW 17.2 (H) 05/13/2024 1617  RDW 14.9 11/08/2023 1402   LYMPHSABS 1.0 05/13/2024 1617   LYMPHSABS 1.0 11/08/2023 1402   MONOABS  0.7 05/13/2024 1617   EOSABS 0.0 05/13/2024 1617   EOSABS 0.2 11/08/2023 1402   BASOSABS 0.0 05/13/2024 1617   BASOSABS 0.1 11/08/2023 1402   Recent Labs    08/01/23 0957 08/31/23 1933 11/08/23 1402 02/19/24 1926 04/07/24 1510 05/07/24 1845 05/13/24 1617  HGB 14.0 14.8 13.6 13.0 11.7* 12.4 13.7    CMP     Component Value Date/Time   NA 137 05/13/2024 1617   NA 141 11/08/2023 1402   K 4.9 05/13/2024 1617   CL 100 05/13/2024 1617   CO2 23 05/13/2024 1617   GLUCOSE 326 (H) 05/13/2024 1617   BUN 51 (H) 05/13/2024 1617   BUN 30 (H) 11/08/2023 1402   CREATININE 2.79 (H) 05/13/2024 1617   CREATININE 1.74 (H) 08/28/2016 1042   CALCIUM  9.6 05/13/2024 1617   CALCIUM  10.2 07/06/2011 1144   PROT 6.8 05/13/2024 1617   PROT 6.8 09/05/2023 1206   ALBUMIN 4.0 05/13/2024 1617   AST 14 05/13/2024 1617   ALT 16 05/13/2024 1617   ALKPHOS 72 05/13/2024 1617   BILITOT 0.6 05/13/2024 1617   GFRNONAA 23 (L) 05/07/2024 1845   GFRAA 28 (L) 08/06/2020 1526      Latest Ref Rng & Units 05/13/2024    4:17 PM 04/07/2024    3:10 PM 09/05/2023   12:06 PM  Hepatic Function  Total Protein 6.0 - 8.3 g/dL 6.8  6.6  6.8   Albumin 3.5 - 5.2 g/dL 4.0  3.3    AST 0 - 37 U/L 14  17    ALT 0 - 35 U/L 16  12    Alk Phosphatase 39 - 117 U/L 72  85    Total Bilirubin 0.2 - 1.2 mg/dL 0.6  0.5        Current Medications:   Current Outpatient Medications (Endocrine & Metabolic):    FARXIGA  5 MG TABS tablet, TAKE 1 TABLET BY MOUTH EVERY DAY BEFORE BREAKFAST   insulin  lispro (HUMALOG ) 100 UNIT/ML injection, GIVE 3 UNITS WITH BREAKFAST, AND 18 UNITS WITH SUPPER   methylPREDNISolone  (MEDROL  DOSEPAK) 4 MG TBPK tablet, Take with signs of chronic sinusitis and take as directed   Glucagon  (BAQSIMI  ONE PACK) 3 MG/DOSE POWD, Place 1 Device into the nose as needed (Low blood sugar with impaired consciousness). (Patient not taking: Reported on 06/30/2024)  Current Facility-Administered Medications (Endocrine &  Metabolic):    denosumab  (PROLIA ) injection 60 mg  Current Outpatient Medications (Cardiovascular):    atorvastatin  (LIPITOR ) 80 MG tablet, TAKE 1 TABLET BY MOUTH EVERY DAY   carvedilol  (COREG ) 3.125 MG tablet, TAKE 1 TABLET BY MOUTH TWICE A DAY WITH FOOD   furosemide  (LASIX ) 40 MG tablet, Take 1 tablet (40 mg total) by mouth daily.   hydrALAZINE  (APRESOLINE ) 25 MG tablet, Take 3 tablets (75 mg total) by mouth 3 (three) times daily.   nitroGLYCERIN  (NITROSTAT ) 0.4 MG SL tablet, Place 1 tablet (0.4 mg total) under the tongue every 5 (five) minutes x 3 doses as needed for chest pain.   Current Outpatient Medications (Respiratory):    albuterol  (PROAIR  HFA) 108 (90 Base) MCG/ACT inhaler, 2 puffs every 4 hours as needed only  if your can't catch your breath   albuterol  (PROVENTIL ) (2.5 MG/3ML) 0.083% nebulizer solution, Take 3 mLs (2.5 mg total) by nebulization every 6 (six) hours as needed for wheezing or shortness of breath.   Azelastine   HCl 137 MCG/SPRAY SOLN, PLACE 2 SPRAYS INTO BOTH NOSTRILS 2 (TWO) TIMES DAILY AS NEEDED FOR ALLERGIES. USE IN EACH NOSTRIL AS DIRECTED   budesonide -formoterol  (SYMBICORT ) 160-4.5 MCG/ACT inhaler, TAKE 2 PUFFS FIRST THING IN MORNING AND THEN ANOTHER 2 PUFFS ABOUT 12 HOURS LATER.   cetirizine  (ZYRTEC  ALLERGY ) 10 MG tablet, Take 1 tablet (10 mg total) by mouth daily.   fluticasone  (FLONASE ) 50 MCG/ACT nasal spray, SPRAY 2 SPRAYS INTO EACH NOSTRIL EVERY DAY   montelukast  (SINGULAIR ) 10 MG tablet, Take 1 tablet (10 mg total) by mouth at bedtime.   Current Outpatient Medications (Analgesics):    acetaminophen  (TYLENOL ) 325 MG tablet, Take 650 mg by mouth every 6 (six) hours as needed for pain.   butalbital -acetaminophen -caffeine  (FIORICET) 50-325-40 MG tablet, Take 1 tablet by mouth every 6 (six) hours as needed for headache.   ULTRAM  50 MG tablet, Take 50 mg by mouth every 6 (six) hours as needed.   Current Outpatient Medications (Hematological):    apixaban   (ELIQUIS ) 2.5 MG TABS tablet, TAKE 1 TABLET BY MOUTH TWICE A DAY   clopidogrel  (PLAVIX ) 75 MG tablet, Take 1 tablet (75 mg total) by mouth daily with breakfast.   vitamin B-12 (CYANOCOBALAMIN ) 100 MCG tablet, Take 100 mcg by mouth daily.   Current Outpatient Medications (Other):    amoxicillin -clavulanate (AUGMENTIN ) 875-125 MG tablet, Take 1 tablet by mouth 2 (two) times daily.   Ascorbic Acid (VITAMIN C) 1000 MG tablet, Take 1,000 mg by mouth daily.   BD VEO INSULIN  SYRINGE U/F 31G X 15/64 0.5 ML MISC, Use as instructed   Blood Glucose Monitoring Suppl (ONETOUCH VERIO FLEX SYSTEM) w/Device KIT, USE AS DIRECTED   cholecalciferol (VITAMIN D3) 25 MCG (1000 UNIT) tablet, Take 1,000 Units by mouth daily. Isn't taking regularly   diclofenac  Sodium (VOLTAREN ) 1 % GEL, Apply 4 g topically 4 (four) times daily as needed (pain).   famotidine  (PEPCID ) 20 MG tablet, TAKE 1 TABLET BY MOUTH TWICE A DAY   glucose blood (ONETOUCH VERIO) test strip, 1 each by Other route 2 (two) times daily. And lancets 2/day   glucose blood test strip, Check blood sugar twice a day   latanoprost  (XALATAN ) 0.005 % ophthalmic solution, Place 1 drop into both eyes at bedtime.   lidocaine  (LIDODERM ) 5 %, Place 1 patch onto the skin daily. Remove & Discard patch within 12 hours or as directed by MD   linaclotide (LINZESS) 145 MCG CAPS capsule, Take 1 capsule daily.   lubiprostone  (AMITIZA ) 8 MCG capsule, as needed.   methocarbamol (ROBAXIN) 500 MG tablet, Take 500 mg by mouth 4 (four) times daily.   ondansetron  (ZOFRAN ) 8 MG tablet, TAKE 1 TABLET BY MOUTH EVERY 8 HOURS AS NEEDED FOR NAUSEA OR VOMITING.   pantoprazole  (PROTONIX ) 40 MG tablet, Take 1 tablet (40 mg total) by mouth daily.   potassium chloride  (KLOR-CON ) 10 MEQ tablet, TAKE 1 TABLET BY MOUTH EVERY DAY   sucralfate  (CARAFATE ) 1 g tablet, Take 1 tablet (1 g total) by mouth 3 (three) times daily as needed for up to 60 doses.   Syringe/Needle, Disp, (SYRINGE  3CC/25GX1) 25G X 1 3 ML MISC, Use to inject into the skin 2x a day   tiZANidine  (ZANAFLEX ) 2 MG tablet, Take 1 tablet (2 mg total) by mouth every 8 (eight) hours as needed for muscle spasms.   tiZANidine  (ZANAFLEX ) 2 MG tablet, Take 1 tablet (2 mg total) by mouth every 8 (eight) hours as needed for muscle spasms.   traZODone  (  DESYREL ) 50 MG tablet, TAKE 0.5 TABLETS BY MOUTH AT BEDTIME AS NEEDED FOR SLEEP.   Medical History:  Past Medical History:  Diagnosis Date   Allergic rhinitis    Anterior chest wall pain    Anxiety    Asthma    Chronic diastolic CHF (congestive heart failure) (HCC)    Echo 01/2020: EF 60-65, normal wall motion, mild LVH, normal RV SF, RVSP 52.6 (moderate elevation), severe LAE, moderate RAE, trivial MR, mild MS (mean gradient 5.5 mmHg), mild aortic valve sclerosis (no AS); elevated E/e' c/w elevated LVEDP   Cough    DM type 2 (diabetes mellitus, type 2) (HCC)    GERD (gastroesophageal reflux disease)    History of diverticulitis of colon    HTN (hypertension)    Hyperlipidemia    IBS (irritable bowel syndrome)    Iron deficiency anemia    Mitral stenosis 04/17/2022   Echo 04/2022: EF 55-60, no RWMA, mild LVH, normal RVSF, normal PASP, moderate LAE, moderate mitral stenosis (mean gradient 4 mmHg), trivial MR, trivial AI, AV sclerosis without stenosis   Osteoporosis, unspecified    Renal insufficiency 07/31/2017   UTI (urinary tract infection)    Allergies:  Allergies  Allergen Reactions   Aspirin  Shortness Of Breath and Other (See Comments)    Caused asthma symptoms   Ramipril Cough   Doxycycline      Nausea     Surgical History:  She  has a past surgical history that includes Esophagogastroduodenoscopy (12/26/01); Cardiovascular stress test (02/25/04); Right oophorectomy (jan 2010); PACEMAKER IMPLANT (N/A, 08/02/2020); RIGHT/LEFT HEART CATH AND CORONARY ANGIOGRAPHY (N/A, 12/28/2021); CORONARY STENT INTERVENTION (N/A, 12/28/2021); and CORONARY BALLOON ANGIOPLASTY  (N/A, 12/28/2021). Family History:  Her family history includes Cancer in her sister; Diabetes in her daughter; Hypertension in her mother; Lung cancer in her brother; Melanoma in her brother; Other in her father; Stroke in her mother.  REVIEW OF SYSTEMS  : All other systems reviewed and negative except where noted in the History of Present Illness.  PHYSICAL EXAM: BP 122/70   Pulse 71   Ht 5' 6 (1.676 m)   Wt 163 lb (73.9 kg)   BMI 26.31 kg/m  Physical Exam   GENERAL APPEARANCE: elderly appearing, in no apparent distress. HEENT: No cervical lymphadenopathy, unremarkable thyroid , sclerae anicteric, conjunctiva pink. RESPIRATORY: Respiratory effort normal, breath sounds equal bilaterally with wheezing present. CARDIO: Regular rate and rhythm with no murmurs, rubs, or gallops, peripheral pulses intact. ABDOMEN: Soft, non-distended, decreased bowel sounds, uncomfortable in lower region, no rebound tenderness, no mass appreciated. RECTAL: Declines. MUSCULOSKELETAL: Full range of motion, antaglic gait, able to get on table with some assistance, without edema. SKIN: Dry, intact without rashes or lesions. No jaundice. NEURO: Alert, oriented, no focal deficits. PSYCH: Cooperative, normal mood and affect.      Alan JONELLE Coombs, PA-C 9:30 AM

## 2024-06-30 ENCOUNTER — Encounter: Payer: Self-pay | Admitting: Physician Assistant

## 2024-06-30 ENCOUNTER — Ambulatory Visit: Admitting: Physician Assistant

## 2024-06-30 VITALS — BP 122/70 | HR 71 | Ht 66.0 in | Wt 163.0 lb

## 2024-06-30 DIAGNOSIS — K5904 Chronic idiopathic constipation: Secondary | ICD-10-CM

## 2024-06-30 DIAGNOSIS — R933 Abnormal findings on diagnostic imaging of other parts of digestive tract: Secondary | ICD-10-CM

## 2024-06-30 DIAGNOSIS — Z7902 Long term (current) use of antithrombotics/antiplatelets: Secondary | ICD-10-CM

## 2024-06-30 DIAGNOSIS — I4811 Longstanding persistent atrial fibrillation: Secondary | ICD-10-CM

## 2024-06-30 DIAGNOSIS — R112 Nausea with vomiting, unspecified: Secondary | ICD-10-CM

## 2024-06-30 DIAGNOSIS — R1310 Dysphagia, unspecified: Secondary | ICD-10-CM

## 2024-06-30 DIAGNOSIS — R1032 Left lower quadrant pain: Secondary | ICD-10-CM

## 2024-06-30 DIAGNOSIS — K581 Irritable bowel syndrome with constipation: Secondary | ICD-10-CM | POA: Diagnosis not present

## 2024-06-30 DIAGNOSIS — R110 Nausea: Secondary | ICD-10-CM

## 2024-06-30 DIAGNOSIS — I5042 Chronic combined systolic (congestive) and diastolic (congestive) heart failure: Secondary | ICD-10-CM

## 2024-06-30 DIAGNOSIS — I2510 Atherosclerotic heart disease of native coronary artery without angina pectoris: Secondary | ICD-10-CM

## 2024-06-30 MED ORDER — LINACLOTIDE 145 MCG PO CAPS: ORAL_CAPSULE | ORAL | 0 refills | Status: DC

## 2024-06-30 NOTE — Progress Notes (Signed)
 Agree with assessment / plan as outlined.

## 2024-06-30 NOTE — Patient Instructions (Addendum)
 Please do the following: Purchase a bottle of Miralax over the counter as well as a box of 5 mg dulcolax tablets. Take 4 dulcolax tablets. Wait 1 hour. You will then drink 6-8 capfuls of Miralax mixed in an adequate amount of water/juice/gatorade (you may choose which of these liquids to drink) over the next 2-3 hours. You should expect results within 1 to 6 hours after completing the bowel purge. Go to the er if you have severe AB pain, can not pass gas or stool in over 12 hours, can not hold down any food.   Linzess 145 mcg start AFTER the bowel purge *IBS-C patients may begin to experience relief from belly pain and overall abdominal symptoms (pain, discomfort, and bloating) in about 1 week,  with symptoms typically improving over 12 weeks.  Take at least 30 minutes before the first meal of the day on an empty stomach You can have a loose stool if you eat a high-fat breakfast. Give it at least 7 days, may have more bowel movements during that time.   The diarrhea should go away and you should start having normal, complete, full bowel movements.  It may be helpful to start treatment when you can be near the comfort of your own bathroom, such as a weekend.  After you are out we can send in a prescription if you did well, there is a prescription card  Please take your proton pump inhibitor medication, pantoprazole  40 mg in the morning with the pepcid  for at LEAST 2 weeks, continue if this helps  Please take this medication 30 minutes to 1 hour before meals- this makes it more effective.  Avoid spicy and acidic foods Avoid fatty foods Limit your intake of coffee, tea, alcohol, and carbonated drinks Work to maintain a healthy weight Keep the head of the bed elevated at least 3 inches with blocks or a wedge pillow if you are having any nighttime symptoms Stay upright for 2 hours after eating Avoid meals and snacks three to four hours before bedtime   Thank you for trusting me with your  gastrointestinal care!   Alan Coombs, PA-C  _______________________________________________________  If your blood pressure at your visit was 140/90 or greater, please contact your primary care physician to follow up on this.  _______________________________________________________  If you are age 73 or older, your body mass index should be between 23-30. Your Body mass index is 26.31 kg/m. If this is out of the aforementioned range listed, please consider follow up with your Primary Care Provider.  If you are age 68 or younger, your body mass index should be between 19-25. Your Body mass index is 26.31 kg/m. If this is out of the aformentioned range listed, please consider follow up with your Primary Care Provider.   ________________________________________________________  The [facility] providers would like to encourage you to use MYCHART to communicate with providers for non-urgent requests or questions.  Due to long hold times on the telephone, sending your provider a message by [facility] may be a faster and more efficient way to get a response.  Please allow 48 business hours for a response.  Please remember that this is for non-urgent requests.  _______________________________________________________  [facility] is using a team-based approach to care.  Your team is made up of your doctor and two to three APPS. Our APPS (Nurse Practitioners and Physician Assistants) work with your physician to ensure care continuity for you. They are fully qualified to address your health concerns and  develop a treatment plan. They communicate directly with your gastroenterologist to care for you. Seeing the Advanced Practice Practitioners on your physician's team can help you by facilitating care more promptly, often allowing for earlier appointments, access to diagnostic testing, procedures, and other specialty referrals.     Gastroparesis Gastroparesis is a condition in which food  takes longer than normal to empty from the stomach.  This condition is also known as delayed gastric emptying. It is usually a long-term (chronic) condition.  What are the signs or symptoms? Symptoms of this condition include: Feeling full after eating very little or a loss of appetite. Nausea, vomiting, or heartburn. Bloating of your abdomen. Inconsistent blood sugar (glucose) levels on blood tests. Unexplained weight loss. Acid from the stomach coming up into the esophagus (gastroesophageal reflux). Sudden tightening (spasm) of the stomach, which can be painful. Symptoms may come and go. Some people may not notice any symptoms.  What increases the risk? You are more likely to develop this condition if: You have certain disorders or diseases. These may include: An endocrine disorder. An eating disorder. Amyloidosis. Scleroderma. Parkinson's disease. Multiple sclerosis. Cancer or infection of the stomach or the vagus nerve. You have had surgery on your stomach or vagus nerve. You take certain medicines. You are female.  Things you can do: Please do small frequent meals like 4-6 meals a day.  Eat and drink liquids at separate times.  Avoid high fiber foods, cook your vegetables, avoid high fat food.  Suggest spreading protein throughout the day (greek yogurt, glucerna, soft meat, milk, eggs) Choose soft foods that you can mash with a fork When you are more symptomatic, change to pureed foods foods and liquids.  Consider reading Living well with Gastroparesis by [facility] Check out this link to a diet online [email or web address].GroupJournal.fr   Diverticulosis Diverticulosis is a condition that develops when small pouches (diverticula) form in the wall of the large intestine (colon). The colon is where water is absorbed and stool (feces) is formed. The pouches form when the inside layer  of the colon pushes through weak spots in the outer layers of the colon. You may have a few pouches or many of them. The pouches usually do not cause problems unless they become inflamed or infected. When this happens, the condition is called diverticulitis- this is left lower quadrant pain, diarrhea, fever, chills, nausea or vomiting.  If this occurs please call the office or go to the hospital. Sometimes these patches without inflammation can also have painless bleeding associated with them, if this happens please call the office or go to the hospital. Preventing constipation and increasing fiber can help reduce diverticula and prevent complications. Even if you feel you have a high-fiber diet, suggest getting on Benefiber or Cirtracel 2 times daily.

## 2024-07-01 ENCOUNTER — Other Ambulatory Visit: Payer: Self-pay | Admitting: Internal Medicine

## 2024-07-03 NOTE — Progress Notes (Signed)
 Remote PPM Transmission

## 2024-07-13 ENCOUNTER — Other Ambulatory Visit: Payer: Self-pay | Admitting: Internal Medicine

## 2024-07-14 ENCOUNTER — Telehealth: Payer: Self-pay | Admitting: Internal Medicine

## 2024-07-14 NOTE — Telephone Encounter (Signed)
 Spoke with Rosaline. Stated the medication was too expensive and would like a different medication. Was not interested in PA at this time. Advised I would send to Dr Okey and RN for review.

## 2024-07-14 NOTE — Telephone Encounter (Signed)
 Pt c/o medication issue:  1. Name of Medication: apixaban  (ELIQUIS ) 2.5 MG TABS tablet   2. How are you currently taking this medication (dosage and times per day)? As written  3. Are you having a reaction (difficulty breathing--STAT)? no  4. What is your medication issue? Pt daughter called in stating Eliquis  is too expensive and wanted to see if their are other options please Advise

## 2024-07-15 ENCOUNTER — Ambulatory Visit: Admitting: Podiatry

## 2024-07-15 ENCOUNTER — Other Ambulatory Visit (HOSPITAL_COMMUNITY): Payer: Self-pay

## 2024-07-15 ENCOUNTER — Encounter: Payer: Self-pay | Admitting: Podiatry

## 2024-07-15 DIAGNOSIS — E1151 Type 2 diabetes mellitus with diabetic peripheral angiopathy without gangrene: Secondary | ICD-10-CM | POA: Diagnosis not present

## 2024-07-15 DIAGNOSIS — M79674 Pain in right toe(s): Secondary | ICD-10-CM | POA: Diagnosis not present

## 2024-07-15 DIAGNOSIS — B351 Tinea unguium: Secondary | ICD-10-CM | POA: Diagnosis not present

## 2024-07-15 DIAGNOSIS — M79675 Pain in left toe(s): Secondary | ICD-10-CM | POA: Diagnosis not present

## 2024-07-15 DIAGNOSIS — L84 Corns and callosities: Secondary | ICD-10-CM

## 2024-07-15 NOTE — Telephone Encounter (Signed)
 Pharmacy Patient Advocate Encounter  Insurance verification completed.   The patient is insured through [employer] claim for eliquis . Refill too soon    Xarelto $0 copay

## 2024-07-15 NOTE — Telephone Encounter (Signed)
 Patient has a history of paroxysmal atrial fibrillation   Needs an anticoagulant Please have pharmacy review with her the costs of different options

## 2024-07-15 NOTE — Telephone Encounter (Signed)
 Can you check cost of Eliquis  2.5mg  #60 and Xarelto 15mg  #30?

## 2024-07-16 NOTE — Telephone Encounter (Signed)
 I was not able to verify her profile in the IVR system with [facility]. They will not push you through to the pharmacy, can only leave a voicemail and get a callback. However, patient should be able to contact [facility] directly to verify their contact info on file and find out what their Eliquis  copay cost is. For the Xarelto to be $0, thEliquis  fill must be meeting the patient's deductible.

## 2024-07-16 NOTE — Telephone Encounter (Signed)
 Pt of Dr. Okey. Does Dr. Okey want to refill this RX? Please advise.

## 2024-07-20 NOTE — Progress Notes (Signed)
  Subjective:  Patient ID: [name], female    DOB: 10-Nov-1935,  MRN: [id number]  [name] presents to clinic today for at risk foot care. Pt has h/o NIDDM with PAD and callus(es) left foot and painful mycotic toenails that are difficult to trim. Painful toenails interfere with ambulation. Aggravating factors include wearing enclosed shoe gear. Pain is relieved with periodic professional debridement. Painful calluses are aggravated when weightbearing with and without shoegear. Pain is relieved with periodic professional debridement.  Chief Complaint  Patient presents with   Nail Problem    Patient stated that she last saw her PCP in September and her name is [name]    New problem(s): None.   PCP is Rose Sheryl Repine, FNP (Inactive).  Allergies  Allergen Reactions   Aspirin  Shortness Of Breath and Other (See Comments)    Caused asthma symptoms   Ramipril Cough   Doxycycline      Nausea    Review of Systems: Negative except as noted in the HPI.  Objective: No changes noted in today's physical examination. There were no vitals filed for this visit. [name] is a pleasant 88 y.o. female in NAD. AAO x 3.  Vascular Examination: CFT <3 seconds b/l. DP pulses faintly palpable left foot and nonpalpable right foot. PT pulses palpable b/l. Digital hair absent. No edema b/l.  Skin temperature gradient warm to warm b/l. No pain with calf compression. No ischemia or gangrene. No cyanosis or clubbing noted b/l.    Neurological Examination: Sensation grossly intact b/l with 10 gram monofilament. Vibratory sensation intact b/l.   Dermatological Examination: Pedal skin warm and supple b/l. No open wounds. No interdigital macerations. Toenails 1-5 b/l thick, discolored, elongated with subungual debris and pain on dorsal palpation. Hyperkeratotic lesion(s) sub 5th met base left foot.  No erythema, no edema, no drainage, no fluctuance.  Musculoskeletal Examination: Muscle strength  5/5 to all lower extremity muscle groups bilaterally. HAV with bunion bilaterally and hammertoes 2-5 b/l.  Radiographs: None  Last HgA1c:      Latest Ref Rng & Units 05/13/2024    9:58 AM 11/16/2023    8:53 AM  Hemoglobin A1C  Hemoglobin-A1c 4.0 - 5.6 % 7.4  7.4    Assessment/Plan: 1. Pain due to onychomycosis of toenails of both feet   2. Callus   3. Type II diabetes mellitus with peripheral circulatory disorder [facility])   Patient was evaluated and treated. All patient's and/or POA's questions/concerns addressed on today's visit. Toenails 1-5 b/l debrided in length and girth without incident. Callus(es) sub 5th met base left foot pared with sharp debridement without incident. Continue daily foot inspections and monitor blood glucose per PCP/Endocrinologist's recommendations. Continue soft, supportive shoe gear daily. Report any pedal injuries to medical professional. Call office if there are any questions/concerns.  Return in about 3 months (around 10/15/2024).  [name], DPM      [place] LOCATION: [address] [place] [address]                   Office [phone]   [facility] LOCATION: [address] [place], [place] [address] Office [phone]

## 2024-07-21 ENCOUNTER — Telehealth: Payer: Self-pay | Admitting: Physician Assistant

## 2024-07-21 NOTE — Telephone Encounter (Signed)
 PT daughter would like to discuss patient's medications and symptoms. Please advise.

## 2024-07-21 NOTE — Telephone Encounter (Signed)
 Patient has a deductible on her plan. Can not see what her price is but it appears if she purchases her Eliquis  it should exhaust the remaining deductible

## 2024-07-21 NOTE — Telephone Encounter (Signed)
 Spoke to patient daughter and she states that they are currently okay with the medication, but appreciates us  looking ahead.

## 2024-07-22 MED ORDER — LINACLOTIDE 145 MCG PO CAPS: 145.0000 ug | ORAL_CAPSULE | Freq: Every day | ORAL | Status: DC

## 2024-07-22 NOTE — Telephone Encounter (Signed)
 Patients daughter returned call,  requesting a call back to discuss. Please advise

## 2024-07-22 NOTE — Telephone Encounter (Signed)
 Called & spoke with Lantana. Rosaline informed me that patient completed the bowel purge and was sick after that for a few days. Patient has reported intermittent abdominal cramping & no bowel movement since Sunday. Patient reports not passing any stool at all, passing small amount of gas. I asked if patient tried the Linzess 145 mcg. Rosaline informed me that we were out of Linzess samples at the time of patient's office visit so she was not able to try it. We have samples in the office today, I have asked Rosaline to stop by 2nd floor front desk to pick up the samples for patient to try. Advised that the patient will have increased stools the first week but it will level out after that time. If 145 mcg is too strong we can decrease and if it is not effective we can increase dose. Rosaline is aware that patient will need to take 1 Linzess 145 mc g capsule at least 30 minutes before her first meal of the day. Rosaline wanted to know if patient can take Miralax. Advised that some patient's do take Miralax in addition to Linzess but we need to see how patient responds to Linzess monotherapy first to determine if additional stool softener is needed. Rosaline verbalized understanding and had no concerns at the end of the call.   3 boxes of Linzess 145 mcg placed at 2nd floor front desk.

## 2024-07-22 NOTE — Telephone Encounter (Signed)
 Lm on vm for patient's daughter, Rosaline, to return call.

## 2024-07-24 ENCOUNTER — Encounter: Payer: Self-pay | Admitting: Dermatology

## 2024-07-24 ENCOUNTER — Ambulatory Visit: Admitting: Dermatology

## 2024-07-24 VITALS — BP 145/85 | HR 65

## 2024-07-24 DIAGNOSIS — L820 Inflamed seborrheic keratosis: Secondary | ICD-10-CM

## 2024-07-24 NOTE — Progress Notes (Signed)
   New Patient Visit   Subjective  [name] is a 88 y.o. female who presents for the following: SKs  Patient states she has sks located at the Upper Body that she would like to have examined. Patient reports the areas have been there for several years. She reports the areas are bothersome. She states the areas can be very itchy. Patient rates irritation 7-8 out of 10. Patient reports she has not previously been treated for these areas. Patient denies Hx of bx. Patient denies family history of skin cancer(s).  The following portions of the chart were reviewed this encounter and updated as appropriate: medications, allergies, medical history  Review of Systems:  No other skin or systemic complaints except as noted in HPI or Assessment and Plan.  Objective  Well appearing patient in no apparent distress; mood and affect are within normal limits.  A full examination was performed including scalp, head, eyes, ears, nose, lips, neck, chest, axillae, abdomen, back, buttocks, bilateral upper extremities, bilateral lower extremities, hands, feet, fingers, toes, fingernails, and toenails. All findings within normal limits unless otherwise noted below.   Relevant exam findings are noted in the Assessment and Plan.  Left Lower Back (3), Left Upper Back (3), Mid Back (2), Right Lower Back (2), Right Upper Back (2)   Assessment & Plan   SEBORRHEIC KERATOSIS - Stuck-on, waxy, tan-brown papules and/or plaques  - Benign-appearing - Discussed benign etiology and prognosis. - Observe - Call for any changes  INFLAMED SEBORRHEIC KERATOSIS (12) Left Lower Back (3), Left Upper Back (3), Mid Back (2), Right Lower Back (2), Right Upper Back (2) Destruction of lesion - Left Lower Back, Left Upper Back Complexity: simple   Destruction method: cryotherapy   Informed consent: discussed and consent obtained   Timeout:  patient name, date of birth, surgical site, and procedure verified Lesion destroyed  using liquid nitrogen: Yes   Cryotherapy cycles:  15 Post-procedure details: wound care instructions given     Return if symptoms worsen or fail to improve.  I, Jetta Ager, am acting as Neurosurgeon for [employer], DO.  Documentation: I have reviewed the above documentation for accuracy and completeness, and I agree with the above.  Delon Lenis, DO

## 2024-07-24 NOTE — Telephone Encounter (Signed)
 Left message for patient informing her she will need to request refill on Trazodone  from PCP per Dr. Okey.  Provided office number for callback if any questions.

## 2024-07-24 NOTE — Patient Instructions (Addendum)

## 2024-07-28 ENCOUNTER — Telehealth (INDEPENDENT_AMBULATORY_CARE_PROVIDER_SITE_OTHER): Payer: Self-pay

## 2024-07-28 ENCOUNTER — Ambulatory Visit

## 2024-07-28 DIAGNOSIS — I442 Atrioventricular block, complete: Secondary | ICD-10-CM | POA: Diagnosis not present

## 2024-07-28 NOTE — Telephone Encounter (Signed)
 Left vm to r/s 12/26/24 appointment

## 2024-07-30 ENCOUNTER — Ambulatory Visit: Payer: Self-pay | Admitting: Cardiology

## 2024-07-30 LAB — CUP PACEART REMOTE DEVICE CHECK
Battery Remaining Longevity: 114 mo
Battery Voltage: 3.01 V
Brady Statistic RV Percent Paced: 99.74 %
Date Time Interrogation Session: 20251026214616
Implantable Lead Connection Status: 753985
Implantable Lead Implant Date: 20211101
Implantable Lead Location: 753860
Implantable Lead Model: 3830
Implantable Pulse Generator Implant Date: 20211101
Lead Channel Impedance Value: 380 Ohm
Lead Channel Impedance Value: 437 Ohm
Lead Channel Pacing Threshold Amplitude: 0.75 V
Lead Channel Pacing Threshold Pulse Width: 0.4 ms
Lead Channel Sensing Intrinsic Amplitude: 8.25 mV
Lead Channel Sensing Intrinsic Amplitude: 8.25 mV
Lead Channel Setting Pacing Amplitude: 2 V
Lead Channel Setting Pacing Pulse Width: 0.4 ms
Lead Channel Setting Sensing Sensitivity: 0.9 mV
Zone Setting Status: 755011

## 2024-07-31 NOTE — Progress Notes (Signed)
 Remote PPM Transmission

## 2024-08-05 ENCOUNTER — Ambulatory Visit: Admitting: Family

## 2024-08-11 ENCOUNTER — Ambulatory Visit: Admitting: Physician Assistant

## 2024-08-11 NOTE — Progress Notes (Unsigned)
 08/12/2024 [name] [id number] 09-12-1936  Referring provider: No ref. provider found Primary GI doctor: Dr. Federico  Will send notes to Dr. Norine.   ASSESSMENT AND PLAN:  Nausea without vomiting with history of GERD, cholelithiasis normal HIDA, diabetes History of dysphagia with water, not with food, no odynophagia Can be with smell of foods, some GERD, clears throat a lot, can have issues with even water, does get full quickly EGD 2009 with candidal esophagitis and nonerosive gastritis 02/20/2014 GES normal 12/25/2021 HIDA scan normal 06/22/2023 barium swallow presbyesophagus tertiary contraction corkscrew appearance mid and distal esophagus initially substantial delayed emptying esophagus and stomach small type I hiatal hernia 13 mm barium tablet impacted just above the hiatal hernia -pantoprazole  daily and pepcid  - given gastroparesis diet,  repeat GES - continue to maximize bowel regimen, will try trulance -Consider repeat EGD with dilatation but patient is higher risk with comorbid consideration so prefers to wait  IBS with constipation 12/18/2019 colonoscopy excellent prep unremarkable other than rectal prolapse 05/02/2024 CTAP W0 cholelithiasis, diverticulosis without diverticulitis small hiatal hernia and large amount of stool throughout the colon Failed Amitiza , linzess Did bowel purge and has been on linzess 145 mcg,  has two episodes of diarrhea/nausea/vomiting with weakness afterwards - switch to trulance daily  Diabetes with history of DKA Uncontrolled sugars could be contributing to the nausea Follow up endocrinology/PCP Gastroparesis diet given, check GES Lab Results  Component Value Date   HGBA1C 7.4 (A) 05/13/2024    CKD stage IV Kidney function could be contributing to the nausea Follow up endocrinology/PCP/nephrology  Combined systolic diastolic heart failure/mitral stenosis 04/2024 Echo EF 55%, LVH, mild elevated pulmonary pressure, reduced RVSF,  moderate MS  CAD On Plavix  Would need to hold prior to procedure  A-fib with history of complete heart block On Eliquis  Would need to hold prior to procedure  Patient Care Team: Jason Leita Repine, FNP (Inactive) as PCP - General (Internal Medicine) Okey Vina GAILS, MD as PCP - Cardiology (Cardiology) Luis Purchase, MD as Attending Physician (Gastroenterology) Kay Kemps, MD as Attending Physician (Orthopedic Surgery) Darlean Ozell NOVAK, MD as Attending Physician (Pulmonary Disease) Roz Anes, MD as Consulting Physician (Ophthalmology) Dallie Vera GAILS, MD as Consulting Physician (Obstetrics and Gynecology) Norine Manuelita LABOR, MD as Attending Physician (Nephrology)  HISTORY OF PRESENT ILLNESS: 88 y.o. female with a past medical history listed below presents for evaluation of nausea without vomiting.   Patient last seen in the office 04/28/2024 by Dr. Federico for left lower quadrant abdominal pain I last saw the patient in the office 06/30/2024  Discussed the use of AI scribe software for clinical note transcription with the patient, who gave verbal consent to proceed.  History of Present Illness   [name] is an 88 year old female with chronic constipation and suspected gastroparesis who presents with gastrointestinal symptoms and medication side effects.  She experiences gastrointestinal symptoms, including nausea, vomiting, and diarrhea, particularly after taking Linzess. Two significant episodes have occurred, with the most recent last Friday, resulting in severe dehydration and weakness. Despite these episodes, she continues to take Linzess but experiences abdominal pain and discomfort. She has a history of constipation, and her current medications, including pantoprazole  and Pepcid , are taken as directed. She also reports a change in appetite and difficulty with certain foods.  She has tried various treatments, including Amitiza  and bowel purges, but continues to  struggle with bowel movements. She reports having a bowel movement on Sunday after being sick  on Friday.  She experiences nausea and occasional vomiting, sometimes triggered by the smell of food or while riding in a car. She felt nauseous and vomited on the way to the appointment. Her appetite has changed, and she is not a regular breakfast eater. She also reports difficulty swallowing liquids, which sometimes leads to choking, although she manages solid foods better by taking small bites.  Her past medical history includes a normal gastric emptying study in 2015. She experiences symptoms such as feeling full quickly, nausea, and inconsistent blood sugars. Her A1c was 7.4 at the last check, but it has been higher in the past. She has lost about 10-15 pounds over the past two to three years, but her weight has been stable recently.  Her family history includes her granddaughter, who also could not tolerate Linzess.        She  reports that she quit smoking about 37 years ago. Her smoking use included cigarettes. She started smoking about 42 years ago. She has a 1.5 pack-year smoking history. She has never been exposed to tobacco smoke. She has never used smokeless tobacco. She reports that she does not drink alcohol and does not use drugs.  RELEVANT GI HISTORY, IMAGING AND LABS: Results   RADIOLOGY CT scan: Small hiatal hernia, large amount of stool (05/2024)  DIAGNOSTIC Gastric emptying study: Normal (2015) Endoscopy: Esophageal candidiasis, gastritis (2009)     Labs 01/2024: CBC with elevated WBC of 13.8. BMP with elevated Cr of 2.28  Gastric emptying study 02/20/14: IMPRESSION:  Normal exam.    HIDA scan 12/25/21: IMPRESSION: Normal hepatobiliary scan, demonstrating patency of both the cystic and common bile ducts.   CT A/P w/o contrast 07/19/22: IMPRESSION: 1. No acute abdominopelvic findings. 2. Colonic diverticulosis without acute diverticulitis. 3. Cholelithiasis without  evidence of acute cholecystitis. 4. Punctate nonobstructing left lower pole renal calculus. 5. Small hiatal hernia. 6. Multichamber cardiomegaly with coronary artery calcifications and aortic valvular and mitral annular calcifications. 7. Similar bibasilar linear atelectasis/scarring with subsegmental mucous plugging and mild bronchiectasis left lower lobe. 8. Aortic Atherosclerosis (ICD10-I70.0).   Chest CT w/o contrast 04/27/23: IMPRESSION: 1. Bronchial wall thickening and mucous plugging in the lower lobes greatest in the left lower lobe. 2. Small hiatal hernia with mild wall thickening of the lower esophagus suggesting esophagitis. 3. Cholelithiasis.   Barium swallow 06/22/23: IMPRESSION: 1. Presbyesophagus with tertiary contractions and corkscrew appearance in the mid and distal esophagus. The contractions and intermittent spasms limit our ability to assess for esophageal narrowings. 2. Initially substantially delayed emptying of the esophagus into the stomach in the upright position. Suspected component of esophageal spasm. Subsequently the esophagus did spontaneously empty into the stomach in the upright position. 3. Small type 1 hiatal hernia. 4. 13 mm barium tablet impacted just above the hiatal hernia.   CT A/P w/o contrast 11/29/23: IMPRESSION: 1. Cholelithiasis. 2. Hepatic cysts. 3. Left vulvar lesion, likely a cyst. This could be assessed further with ultrasound. 4. Aortic atherosclerosis.   EGD 04/02/08:    Colonoscopy 12/18/19: Excellent prep.   CBC    Component Value Date/Time   WBC 9.6 05/13/2024 1617   RBC 4.87 05/13/2024 1617   HGB 13.7 05/13/2024 1617   HGB 13.6 11/08/2023 1402   HCT 41.4 05/13/2024 1617   HCT 40.5 11/08/2023 1402   PLT 137.0 (L) 05/13/2024 1617   PLT 179 11/08/2023 1402   MCV 85.1 05/13/2024 1617   MCV 88 11/08/2023 1402   MCH 28.0 05/07/2024 1845  MCHC 33.1 05/13/2024 1617   RDW 17.2 (H) 05/13/2024 1617   RDW 14.9 11/08/2023  1402   LYMPHSABS 1.0 05/13/2024 1617   LYMPHSABS 1.0 11/08/2023 1402   MONOABS 0.7 05/13/2024 1617   EOSABS 0.0 05/13/2024 1617   EOSABS 0.2 11/08/2023 1402   BASOSABS 0.0 05/13/2024 1617   BASOSABS 0.1 11/08/2023 1402   Recent Labs    08/31/23 1933 11/08/23 1402 02/19/24 1926 04/07/24 1510 05/07/24 1845 05/13/24 1617  HGB 14.8 13.6 13.0 11.7* 12.4 13.7    CMP     Component Value Date/Time   NA 137 05/13/2024 1617   NA 141 11/08/2023 1402   K 4.9 05/13/2024 1617   CL 100 05/13/2024 1617   CO2 23 05/13/2024 1617   GLUCOSE 326 (H) 05/13/2024 1617   BUN 51 (H) 05/13/2024 1617   BUN 30 (H) 11/08/2023 1402   CREATININE 2.79 (H) 05/13/2024 1617   CREATININE 1.74 (H) 08/28/2016 1042   CALCIUM  9.6 05/13/2024 1617   CALCIUM  10.2 07/06/2011 1144   PROT 6.8 05/13/2024 1617   PROT 6.8 09/05/2023 1206   ALBUMIN 4.0 05/13/2024 1617   AST 14 05/13/2024 1617   ALT 16 05/13/2024 1617   ALKPHOS 72 05/13/2024 1617   BILITOT 0.6 05/13/2024 1617   GFRNONAA 23 (L) 05/07/2024 1845   GFRAA 28 (L) 08/06/2020 1526      Latest Ref Rng & Units 05/13/2024    4:17 PM 04/07/2024    3:10 PM 09/05/2023   12:06 PM  Hepatic Function  Total Protein 6.0 - 8.3 g/dL 6.8  6.6  6.8   Albumin 3.5 - 5.2 g/dL 4.0  3.3    AST 0 - 37 U/L 14  17    ALT 0 - 35 U/L 16  12    Alk Phosphatase 39 - 117 U/L 72  85    Total Bilirubin 0.2 - 1.2 mg/dL 0.6  0.5        Current Medications:   Current Outpatient Medications (Endocrine & Metabolic):    FARXIGA  5 MG TABS tablet, TAKE 1 TABLET BY MOUTH EVERY DAY BEFORE BREAKFAST   Glucagon  (BAQSIMI  ONE PACK) 3 MG/DOSE POWD, Place 1 Device into the nose as needed (Low blood sugar with impaired consciousness).   insulin  lispro (HUMALOG ) 100 UNIT/ML injection, GIVE 3 UNITS WITH BREAKFAST, AND 18 UNITS WITH SUPPER   methylPREDNISolone  (MEDROL  DOSEPAK) 4 MG TBPK tablet, Take with signs of chronic sinusitis and take as directed  Current Facility-Administered Medications  (Endocrine & Metabolic):    denosumab  (PROLIA ) injection 60 mg  Current Outpatient Medications (Cardiovascular):    atorvastatin  (LIPITOR ) 80 MG tablet, TAKE 1 TABLET BY MOUTH EVERY DAY   carvedilol  (COREG ) 3.125 MG tablet, TAKE 1 TABLET BY MOUTH TWICE A DAY WITH FOOD   furosemide  (LASIX ) 40 MG tablet, TAKE 1 TABLET BY MOUTH TWICE A DAY   hydrALAZINE  (APRESOLINE ) 25 MG tablet, TAKE 3 TABLETS (75 MG TOTAL) BY MOUTH 3 TIMES A DAY   nitroGLYCERIN  (NITROSTAT ) 0.4 MG SL tablet, Place 1 tablet (0.4 mg total) under the tongue every 5 (five) minutes x 3 doses as needed for chest pain.   Current Outpatient Medications (Respiratory):    albuterol  (PROAIR  HFA) 108 (90 Base) MCG/ACT inhaler, 2 puffs every 4 hours as needed only  if your can't catch your breath   albuterol  (PROVENTIL ) (2.5 MG/3ML) 0.083% nebulizer solution, Take 3 mLs (2.5 mg total) by nebulization every 6 (six) hours as needed for wheezing or shortness of breath.  Azelastine  HCl 137 MCG/SPRAY SOLN, PLACE 2 SPRAYS INTO BOTH NOSTRILS 2 (TWO) TIMES DAILY AS NEEDED FOR ALLERGIES. USE IN EACH NOSTRIL AS DIRECTED   budesonide -formoterol  (SYMBICORT ) 160-4.5 MCG/ACT inhaler, TAKE 2 PUFFS FIRST THING IN MORNING AND THEN ANOTHER 2 PUFFS ABOUT 12 HOURS LATER.   cetirizine  (ZYRTEC  ALLERGY ) 10 MG tablet, Take 1 tablet (10 mg total) by mouth daily.   fluticasone  (FLONASE ) 50 MCG/ACT nasal spray, SPRAY 2 SPRAYS INTO EACH NOSTRIL EVERY DAY   montelukast  (SINGULAIR ) 10 MG tablet, Take 1 tablet (10 mg total) by mouth at bedtime.   Current Outpatient Medications (Analgesics):    acetaminophen  (TYLENOL ) 325 MG tablet, Take 650 mg by mouth every 6 (six) hours as needed for pain.   butalbital -acetaminophen -caffeine  (FIORICET) 50-325-40 MG tablet, Take 1 tablet by mouth every 6 (six) hours as needed for headache.   ULTRAM  50 MG tablet, Take 50 mg by mouth every 6 (six) hours as needed.   Current Outpatient Medications (Hematological):    apixaban   (ELIQUIS ) 2.5 MG TABS tablet, TAKE 1 TABLET BY MOUTH TWICE A DAY   clopidogrel  (PLAVIX ) 75 MG tablet, Take 1 tablet (75 mg total) by mouth daily with breakfast.   vitamin B-12 (CYANOCOBALAMIN ) 100 MCG tablet, Take 100 mcg by mouth daily.   Current Outpatient Medications (Other):    amoxicillin -clavulanate (AUGMENTIN ) 875-125 MG tablet, Take 1 tablet by mouth 2 (two) times daily.   Ascorbic Acid (VITAMIN C) 1000 MG tablet, Take 1,000 mg by mouth daily.   BD VEO INSULIN  SYRINGE U/F 31G X 15/64 0.5 ML MISC, Use as instructed   Blood Glucose Monitoring Suppl (ONETOUCH VERIO FLEX SYSTEM) w/Device KIT, USE AS DIRECTED   cholecalciferol (VITAMIN D3) 25 MCG (1000 UNIT) tablet, Take 1,000 Units by mouth daily. Isn't taking regularly   diclofenac  Sodium (VOLTAREN ) 1 % GEL, Apply 4 g topically 4 (four) times daily as needed (pain).   famotidine  (PEPCID ) 20 MG tablet, TAKE 1 TABLET BY MOUTH TWICE A DAY   glucose blood (ONETOUCH VERIO) test strip, 1 each by Other route 2 (two) times daily. And lancets 2/day   glucose blood test strip, Check blood sugar twice a day   latanoprost  (XALATAN ) 0.005 % ophthalmic solution, Place 1 drop into both eyes at bedtime.   lidocaine  (LIDODERM ) 5 %, Place 1 patch onto the skin daily. Remove & Discard patch within 12 hours or as directed by MD   methocarbamol (ROBAXIN) 500 MG tablet, Take 500 mg by mouth 4 (four) times daily.   ondansetron  (ZOFRAN ) 8 MG tablet, TAKE 1 TABLET BY MOUTH EVERY 8 HOURS AS NEEDED FOR NAUSEA OR VOMITING.   pantoprazole  (PROTONIX ) 40 MG tablet, Take 1 tablet (40 mg total) by mouth daily.   Plecanatide (TRULANCE) 3 MG TABS, Take 1 tablet (3 mg total) by mouth daily.   potassium chloride  (KLOR-CON ) 10 MEQ tablet, TAKE 1 TABLET BY MOUTH EVERY DAY   sucralfate  (CARAFATE ) 1 g tablet, Take 1 tablet (1 g total) by mouth 3 (three) times daily as needed for up to 60 doses.   Syringe/Needle, Disp, (SYRINGE 3CC/25GX1) 25G X 1 3 ML MISC, Use to inject into  the skin 2x a day   tiZANidine  (ZANAFLEX ) 2 MG tablet, Take 1 tablet (2 mg total) by mouth every 8 (eight) hours as needed for muscle spasms.   tiZANidine  (ZANAFLEX ) 2 MG tablet, Take 1 tablet (2 mg total) by mouth every 8 (eight) hours as needed for muscle spasms.   traZODone  (DESYREL ) 50 MG tablet,  TAKE 0.5 TABLETS BY MOUTH AT BEDTIME AS NEEDED FOR SLEEP.   Medical History:  Past Medical History:  Diagnosis Date   Allergic rhinitis    Anterior chest wall pain    Anxiety    Asthma    Chronic diastolic CHF (congestive heart failure) (HCC)    Echo 01/2020: EF 60-65, normal wall motion, mild LVH, normal RV SF, RVSP 52.6 (moderate elevation), severe LAE, moderate RAE, trivial MR, mild MS (mean gradient 5.5 mmHg), mild aortic valve sclerosis (no AS); elevated E/e' c/w elevated LVEDP   Cough    DM type 2 (diabetes mellitus, type 2) (HCC)    GERD (gastroesophageal reflux disease)    History of diverticulitis of colon    HTN (hypertension)    Hyperlipidemia    IBS (irritable bowel syndrome)    Iron deficiency anemia    Mitral stenosis 04/17/2022   Echo 04/2022: EF 55-60, no RWMA, mild LVH, normal RVSF, normal PASP, moderate LAE, moderate mitral stenosis (mean gradient 4 mmHg), trivial MR, trivial AI, AV sclerosis without stenosis   Osteoporosis, unspecified    Renal insufficiency 07/31/2017   UTI (urinary tract infection)    Allergies:  Allergies  Allergen Reactions   Aspirin  Shortness Of Breath and Other (See Comments)    Caused asthma symptoms   Ramipril Cough   Doxycycline      Nausea     Surgical History:  She  has a past surgical history that includes Esophagogastroduodenoscopy (12/26/01); Cardiovascular stress test (02/25/04); Right oophorectomy (jan 2010); PACEMAKER IMPLANT (N/A, 08/02/2020); RIGHT/LEFT HEART CATH AND CORONARY ANGIOGRAPHY (N/A, 12/28/2021); CORONARY STENT INTERVENTION (N/A, 12/28/2021); and CORONARY BALLOON ANGIOPLASTY (N/A, 12/28/2021). Family History:  Her family  history includes Cancer in her sister; Diabetes in her daughter; Hypertension in her mother; Lung cancer in her brother; Melanoma in her brother; Other in her father; Stroke in her mother.  REVIEW OF SYSTEMS  : All other systems reviewed and negative except where noted in the History of Present Illness.  PHYSICAL EXAM: BP (!) 118/56   Pulse 71   Ht 5' 6 (1.676 m)   Wt 163 lb 2 oz (74 kg)   BMI 26.33 kg/m  Physical Exam   GENERAL APPEARANCE: elderly appearing, in no apparent distress. HEENT: No cervical lymphadenopathy, unremarkable thyroid , sclerae anicteric, conjunctiva pink. RESPIRATORY: Respiratory effort normal, breath sounds equal bilaterally with wheezing present. CARDIO: Regular rate and rhythm with no murmurs, rubs, or gallops, peripheral pulses intact. ABDOMEN: Soft, non-distended, decreased bowel sounds, uncomfortable in lower region, no rebound tenderness, no mass appreciated. RECTAL: Declines. MUSCULOSKELETAL: Full range of motion, in wheel chair, without edema. SKIN: Dry, intact without rashes or lesions. No jaundice. NEURO: Alert, oriented, no focal deficits. PSYCH: Cooperative, normal mood and affect.      Alan JONELLE Coombs, PA-C 11:37 AM

## 2024-08-12 ENCOUNTER — Ambulatory Visit (INDEPENDENT_AMBULATORY_CARE_PROVIDER_SITE_OTHER): Admitting: Physician Assistant

## 2024-08-12 ENCOUNTER — Encounter: Payer: Self-pay | Admitting: Physician Assistant

## 2024-08-12 ENCOUNTER — Other Ambulatory Visit

## 2024-08-12 VITALS — BP 118/56 | HR 71 | Ht 66.0 in | Wt 163.1 lb

## 2024-08-12 DIAGNOSIS — K5904 Chronic idiopathic constipation: Secondary | ICD-10-CM | POA: Diagnosis not present

## 2024-08-12 DIAGNOSIS — R933 Abnormal findings on diagnostic imaging of other parts of digestive tract: Secondary | ICD-10-CM | POA: Diagnosis not present

## 2024-08-12 DIAGNOSIS — B370 Candidal stomatitis: Secondary | ICD-10-CM

## 2024-08-12 DIAGNOSIS — I2510 Atherosclerotic heart disease of native coronary artery without angina pectoris: Secondary | ICD-10-CM

## 2024-08-12 DIAGNOSIS — R110 Nausea: Secondary | ICD-10-CM

## 2024-08-12 DIAGNOSIS — I5042 Chronic combined systolic (congestive) and diastolic (congestive) heart failure: Secondary | ICD-10-CM | POA: Diagnosis not present

## 2024-08-12 DIAGNOSIS — R1032 Left lower quadrant pain: Secondary | ICD-10-CM

## 2024-08-12 DIAGNOSIS — I4811 Longstanding persistent atrial fibrillation: Secondary | ICD-10-CM

## 2024-08-12 DIAGNOSIS — D649 Anemia, unspecified: Secondary | ICD-10-CM

## 2024-08-12 MED ORDER — TRULANCE 3 MG PO TABS: 3.0000 mg | ORAL_TABLET | Freq: Every day | ORAL | 0 refills | Status: AC

## 2024-08-12 NOTE — Patient Instructions (Addendum)
 Follow up in 2 months.  Gastroparesis Gastroparesis is a condition in which food takes longer than normal to empty from the stomach.  This condition is also known as delayed gastric emptying. It is usually a long-term (chronic) condition.  What are the signs or symptoms? Symptoms of this condition include: Feeling full after eating very little or a loss of appetite. Nausea, vomiting, or heartburn. Bloating of your abdomen. Inconsistent blood sugar (glucose) levels on blood tests. Unexplained weight loss. Acid from the stomach coming up into the esophagus (gastroesophageal reflux). Sudden tightening (spasm) of the stomach, which can be painful. Symptoms may come and go. Some people may not notice any symptoms.  What increases the risk? You are more likely to develop this condition if: You have certain disorders or diseases. These may include: An endocrine disorder. An eating disorder. Amyloidosis. Scleroderma. Parkinson's disease. Multiple sclerosis. Cancer or infection of the stomach or the vagus nerve. You have had surgery on your stomach or vagus nerve. You take certain medicines. You are female.  Things you can do: Please do small frequent meals like 4-6 meals a day.  Eat and drink liquids at separate times.  Avoid high fiber foods, cook your vegetables, avoid high fat food.  Suggest spreading protein throughout the day (greek yogurt, glucerna, soft meat, milk, eggs) Choose soft foods that you can mash with a fork When you are more symptomatic, change to pureed foods foods and liquids.  Consider reading Living well with Gastroparesis by [facility] Check out this link to a diet online https://my.groupjournal.fr   Thank you for trusting me with your gastrointestinal care!   Alan Coombs, PA-C  _______________________________________________________  If your blood  pressure at your visit was 140/90 or greater, please contact your primary care physician to follow up on this.  _______________________________________________________  If you are age 40 or older, your body mass index should be between 23-30. Your Body mass index is 26.33 kg/m. If this is out of the aforementioned range listed, please consider follow up with your Primary Care Provider.  If you are age 5 or younger, your body mass index should be between 19-25. Your Body mass index is 26.33 kg/m. If this is out of the aformentioned range listed, please consider follow up with your Primary Care Provider.   ________________________________________________________  The [facility] providers would like to encourage you to use MYCHART to communicate with providers for non-urgent requests or questions.  Due to long hold times on the telephone, sending your provider a message by [facility] may be a faster and more efficient way to get a response.  Please allow 48 business hours for a response.  Please remember that this is for non-urgent requests.  _______________________________________________________  [facility] is using a team-based approach to care.  Your team is made up of your doctor and two to three APPS. Our APPS (Nurse Practitioners and Physician Assistants) work with your physician to ensure care continuity for you. They are fully qualified to address your health concerns and develop a treatment plan. They communicate directly with your gastroenterologist to care for you. Seeing the Advanced Practice Practitioners on your physician's team can help you by facilitating care more promptly, often allowing for earlier appointments, access to diagnostic testing, procedures, and other specialty referrals.

## 2024-08-13 ENCOUNTER — Ambulatory Visit: Admitting: "Endocrinology

## 2024-08-20 ENCOUNTER — Ambulatory Visit: Admitting: Obstetrics and Gynecology

## 2024-08-21 ENCOUNTER — Encounter (INDEPENDENT_AMBULATORY_CARE_PROVIDER_SITE_OTHER): Payer: Self-pay

## 2024-08-21 ENCOUNTER — Ambulatory Visit (INDEPENDENT_AMBULATORY_CARE_PROVIDER_SITE_OTHER)

## 2024-08-21 VITALS — BP 133/83 | HR 70 | Wt 163.0 lb

## 2024-08-21 DIAGNOSIS — K115 Sialolithiasis: Secondary | ICD-10-CM

## 2024-08-21 DIAGNOSIS — K1123 Chronic sialoadenitis: Secondary | ICD-10-CM | POA: Diagnosis not present

## 2024-08-21 MED ORDER — METHYLPREDNISOLONE 4 MG PO TBPK: ORAL_TABLET | ORAL | 1 refills | Status: DC

## 2024-08-21 MED ORDER — AMOXICILLIN-POT CLAVULANATE 875-125 MG PO TABS: 1.0000 | ORAL_TABLET | Freq: Two times a day (BID) | ORAL | 0 refills | Status: DC

## 2024-08-21 NOTE — Progress Notes (Signed)
 HPI:   Discussed the use of AI scribe software for clinical note transcription with the patient, who gave verbal consent to proceed.  History of Present Illness [name] is an 88 year old female with salivary gland stones who presents for medication refills.  She has a history of salivary gland stones causing significant discomfort, with large stones leading to mouth swelling and pain, especially when irritated. The sensation is described as significant pain with the pain becoming so severe, leading to episodes of crying. Swelling increases with more intense pain.  She manages her condition with Augmentin  and prednisone , which are effective in alleviating symptoms, though it takes a couple of days for the medication to start working once the pain becomes severe. She is here today to refill these medications.  She recalls a recent flare-up approximately two weeks ago, which prompted the scheduling of this appointment.   PMH/Meds/All/SocHx/FamHx/ROS: Past Medical History:  Diagnosis Date   Allergic rhinitis    Anterior chest wall pain    Anxiety    Asthma    Chronic diastolic CHF (congestive heart failure) (HCC)    Echo 01/2020: EF 60-65, normal wall motion, mild LVH, normal RV SF, RVSP 52.6 (moderate elevation), severe LAE, moderate RAE, trivial MR, mild MS (mean gradient 5.5 mmHg), mild aortic valve sclerosis (no AS); elevated E/e' c/w elevated LVEDP   Cough    DM type 2 (diabetes mellitus, type 2) (HCC)    GERD (gastroesophageal reflux disease)    History of diverticulitis of colon    HTN (hypertension)    Hyperlipidemia    IBS (irritable bowel syndrome)    Iron deficiency anemia    Mitral stenosis 04/17/2022   Echo 04/2022: EF 55-60, no RWMA, mild LVH, normal RVSF, normal PASP, moderate LAE, moderate mitral stenosis (mean gradient 4 mmHg), trivial MR, trivial AI, AV sclerosis without stenosis   Osteoporosis, unspecified    Renal insufficiency 07/31/2017   UTI (urinary tract  infection)    Past Surgical History:  Procedure Laterality Date   CARDIOVASCULAR STRESS TEST  02/25/04   CORONARY BALLOON ANGIOPLASTY N/A 12/28/2021   Procedure: CORONARY BALLOON ANGIOPLASTY;  Surgeon: Verlin Lonni BIRCH, MD;  Location: [facility];  Service: Cardiovascular;  Laterality: N/A;   CORONARY STENT INTERVENTION N/A 12/28/2021   Procedure: CORONARY STENT INTERVENTION;  Surgeon: Verlin Lonni BIRCH, MD;  Location: [facility];  Service: Cardiovascular;  Laterality: N/A;   ESOPHAGOGASTRODUODENOSCOPY  12/26/01   PACEMAKER IMPLANT N/A 08/02/2020   Procedure: PACEMAKER IMPLANT;  Surgeon: Fernande Elspeth BROCKS, MD;  Location: [facility] INVASIVE CV LAB;  Service: Cardiovascular;  Laterality: N/A;   RIGHT OOPHORECTOMY  jan 2010   RIGHT/LEFT HEART CATH AND CORONARY ANGIOGRAPHY N/A 12/28/2021   Procedure: RIGHT/LEFT HEART CATH AND CORONARY ANGIOGRAPHY;  Surgeon: Verlin Lonni BIRCH, MD;  Location: [facility];  Service: Cardiovascular;  Laterality: N/A;   No family history of bleeding disorders, wound healing problems or difficulty with anesthesia.  Social Connections: Moderately Integrated (06/13/2023)   Social Connection and Isolation Panel    Frequency of Communication with Friends and Family: More than three times a week    Frequency of Social Gatherings with Friends and Family: More than three times a week    Attends Religious Services: More than 4 times per year    Active Member of [employer] or Organizations: Yes    Attends Banker Meetings: 1 to 4 times per year    Marital Status: Widowed  Current Outpatient Medications:    acetaminophen  (TYLENOL ) 325 MG tablet, Take 650 mg by mouth every 6 (six) hours as needed for pain., Disp: , Rfl:    albuterol  (PROAIR  HFA) 108 (90 Base) MCG/ACT inhaler, 2 puffs every 4 hours as needed only  if your can't catch your breath, Disp: 18 g, Rfl: 11   albuterol  (PROVENTIL ) (2.5 MG/3ML) 0.083% nebulizer solution, Take 3 mLs  (2.5 mg total) by nebulization every 6 (six) hours as needed for wheezing or shortness of breath., Disp: 150 mL, Rfl: 1   apixaban  (ELIQUIS ) 2.5 MG TABS tablet, TAKE 1 TABLET BY MOUTH TWICE A DAY, Disp: 60 tablet, Rfl: 6   Ascorbic Acid (VITAMIN C) 1000 MG tablet, Take 1,000 mg by mouth daily., Disp: , Rfl:    atorvastatin  (LIPITOR ) 80 MG tablet, TAKE 1 TABLET BY MOUTH EVERY DAY, Disp: 90 tablet, Rfl: 3   Azelastine  HCl 137 MCG/SPRAY SOLN, PLACE 2 SPRAYS INTO BOTH NOSTRILS 2 (TWO) TIMES DAILY AS NEEDED FOR ALLERGIES. USE IN EACH NOSTRIL AS DIRECTED, Disp: 90 mL, Rfl: 1   BD VEO INSULIN  SYRINGE U/F 31G X 15/64 0.5 ML MISC, Use as instructed, Disp: 100 each, Rfl: 3   Blood Glucose Monitoring Suppl (ONETOUCH VERIO FLEX SYSTEM) w/Device KIT, USE AS DIRECTED, Disp: 1 kit, Rfl: .   budesonide -formoterol  (SYMBICORT ) 160-4.5 MCG/ACT inhaler, TAKE 2 PUFFS FIRST THING IN MORNING AND THEN ANOTHER 2 PUFFS ABOUT 12 HOURS LATER., Disp: 30.6 each, Rfl: 6   butalbital -acetaminophen -caffeine  (FIORICET) 50-325-40 MG tablet, Take 1 tablet by mouth every 6 (six) hours as needed for headache., Disp: 20 tablet, Rfl: 0   carvedilol  (COREG ) 3.125 MG tablet, TAKE 1 TABLET BY MOUTH TWICE A DAY WITH FOOD, Disp: 180 tablet, Rfl: 3   cetirizine  (ZYRTEC  ALLERGY ) 10 MG tablet, Take 1 tablet (10 mg total) by mouth daily., Disp: 30 tablet, Rfl: 5   cholecalciferol (VITAMIN D3) 25 MCG (1000 UNIT) tablet, Take 1,000 Units by mouth daily. Isn't taking regularly, Disp: , Rfl:    clopidogrel  (PLAVIX ) 75 MG tablet, Take 1 tablet (75 mg total) by mouth daily with breakfast., Disp: 90 tablet, Rfl: 3   diclofenac  Sodium (VOLTAREN ) 1 % GEL, Apply 4 g topically 4 (four) times daily as needed (pain)., Disp: 150 g, Rfl: 0   famotidine  (PEPCID ) 20 MG tablet, TAKE 1 TABLET BY MOUTH TWICE A DAY, Disp: 180 tablet, Rfl: 2   FARXIGA  5 MG TABS tablet, TAKE 1 TABLET BY MOUTH EVERY DAY BEFORE BREAKFAST, Disp: 30 tablet, Rfl: 3   fluticasone  (FLONASE ) 50  MCG/ACT nasal spray, SPRAY 2 SPRAYS INTO EACH NOSTRIL EVERY DAY, Disp: 48 mL, Rfl: 1   furosemide  (LASIX ) 40 MG tablet, TAKE 1 TABLET BY MOUTH TWICE A DAY, Disp: 180 tablet, Rfl: 3   Glucagon  (BAQSIMI  ONE PACK) 3 MG/DOSE POWD, Place 1 Device into the nose as needed (Low blood sugar with impaired consciousness)., Disp: 2 each, Rfl: 3   glucose blood (ONETOUCH VERIO) test strip, 1 each by Other route 2 (two) times daily. And lancets 2/day, Disp: 200 each, Rfl: 3   glucose blood test strip, Check blood sugar twice a day, Disp: 100 each, Rfl: 12   hydrALAZINE  (APRESOLINE ) 25 MG tablet, TAKE 3 TABLETS (75 MG TOTAL) BY MOUTH 3 TIMES A DAY, Disp: 810 tablet, Rfl: 3   insulin  lispro (HUMALOG ) 100 UNIT/ML injection, GIVE 3 UNITS WITH BREAKFAST, AND 18 UNITS WITH SUPPER, Disp: 10 mL, Rfl: 11   latanoprost  (XALATAN ) 0.005 % ophthalmic solution,  Place 1 drop into both eyes at bedtime., Disp: , Rfl:    lidocaine  (LIDODERM ) 5 %, Place 1 patch onto the skin daily. Remove & Discard patch within 12 hours or as directed by MD, Disp: 30 patch, Rfl: 0   methocarbamol (ROBAXIN) 500 MG tablet, Take 500 mg by mouth 4 (four) times daily., Disp: , Rfl:    montelukast  (SINGULAIR ) 10 MG tablet, Take 1 tablet (10 mg total) by mouth at bedtime., Disp: 90 tablet, Rfl: 3   nitroGLYCERIN  (NITROSTAT ) 0.4 MG SL tablet, Place 1 tablet (0.4 mg total) under the tongue every 5 (five) minutes x 3 doses as needed for chest pain., Disp: 25 tablet, Rfl: 2   ondansetron  (ZOFRAN ) 8 MG tablet, TAKE 1 TABLET BY MOUTH EVERY 8 HOURS AS NEEDED FOR NAUSEA OR VOMITING., Disp: 18 tablet, Rfl: 0   pantoprazole  (PROTONIX ) 40 MG tablet, Take 1 tablet (40 mg total) by mouth daily., Disp: 180 tablet, Rfl: 3   Plecanatide  (TRULANCE ) 3 MG TABS, Take 1 tablet (3 mg total) by mouth daily., Disp: 90 tablet, Rfl: 0   potassium chloride  (KLOR-CON ) 10 MEQ tablet, TAKE 1 TABLET BY MOUTH EVERY DAY, Disp: 90 tablet, Rfl: 3   sucralfate  (CARAFATE ) 1 g tablet, Take 1  tablet (1 g total) by mouth 3 (three) times daily as needed for up to 60 doses., Disp: 60 tablet, Rfl: 0   Syringe/Needle, Disp, (SYRINGE 3CC/25GX1) 25G X 1 3 ML MISC, Use to inject into the skin 2x a day, Disp: 100 each, Rfl: 3   tiZANidine  (ZANAFLEX ) 2 MG tablet, Take 1 tablet (2 mg total) by mouth every 8 (eight) hours as needed for muscle spasms., Disp: 30 tablet, Rfl: 0   tiZANidine  (ZANAFLEX ) 2 MG tablet, Take 1 tablet (2 mg total) by mouth every 8 (eight) hours as needed for muscle spasms., Disp: 30 tablet, Rfl: 0   traZODone  (DESYREL ) 50 MG tablet, TAKE 0.5 TABLETS BY MOUTH AT BEDTIME AS NEEDED FOR SLEEP., Disp: 15 tablet, Rfl: 0   ULTRAM  50 MG tablet, Take 50 mg by mouth every 6 (six) hours as needed., Disp: , Rfl:    vitamin B-12 (CYANOCOBALAMIN ) 100 MCG tablet, Take 100 mcg by mouth daily., Disp: , Rfl:    amoxicillin -clavulanate (AUGMENTIN ) 875-125 MG tablet, Take 1 tablet by mouth 2 (two) times daily., Disp: 20 tablet, Rfl: 0   methylPREDNISolone  (MEDROL  DOSEPAK) 4 MG TBPK tablet, Take with signs of chronic sinusitis and take as directed, Disp: 1 each, Rfl: 1  Current Facility-Administered Medications:    denosumab  (PROLIA ) injection 60 mg, 60 mg, Subcutaneous, Once, Jason Leita Repine, FNP A complete ROS was performed with pertinent positives/negatives noted in the HPI. The remainder of the ROS are negative.   Physical Exam:  BP 133/83 (BP Location: Right Arm, Patient Position: Sitting, Cuff Size: Normal)   Pulse 70   Wt 163 lb (73.9 kg)   SpO2 96%   BMI 26.31 kg/m  General: Well developed, well nourished. No acute distress.  Head/Face: Normocephalic. No sinus tenderness. Facial nerve intact and equal bilaterally. No facial lacerations. Eyes: PERRL, no scleral icterus or conjunctival hemorrhage. EOMI. Ears: No gross deformity. Normal external canal. Tympanic membrane in tact bilaterally Hearing: Normal speech reception.  Nose: No gross deformity or lesions. No purulent  discharge. No turbinate hypertrophy. Mouth/Oropharynx: Lips without any lesions. No mucosal lesions within the oropharynx. No tonsillar enlargement, exudate, or lesions. Pharyngeal walls symmetrical. Uvula midline. Tongue midline without lesions. Larynx: See TFL if applicable Nasopharynx:  See TFL if applicable Neck: Trachea midline. No masses. No thyromegaly or nodules palpated. No crepitus. Left submandibular gland with significant swelling and pain on palpation. No purulent drainage from [facility] duct.  Lymphatic: No lymphadenopathy in the neck. Respiratory: No stridor or distress. Room air. Cardiovascular: Regular rate and rhythm. Extremities: No edema or cyanosis. Warm and well-perfused. Skin: No scars or lesions on face or neck. Neurologic: CN II-XII grossly intact. Moving all extremities without gross abnormality. Other:  Independent Review of Additional Tests or Records: CT neck maxillofacial 11/08/2023 - Large left submandibular sialolith obstructing the salivary duct network, no abscess noted  Procedures: None Impression & Plans: [name] is a 88 y.o. 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 Long discussion with patient and daughter regarding recommendation for surgical management. Given patients cardiac and renal history patient and daughter are concerned about general anesthesia needed during surgery.  Left submandibular sialolithiasis causing recurrent sialadenitis - Managed on Augmentin  and Prednisone  for flare-ups - Recommend PCP, cardiac, and nephrologist evaluation for pre-operative clearance - highly recommend surgical management of left submandibular gland given risk of sepsis if infection becomes severe - Family and patient would like to continue conservative management at this time but have agreed to follow-up with cardiologist and nephrologist regarding pre-operative clearance   Adah Malkin, DO [facility]

## 2024-08-26 ENCOUNTER — Encounter: Payer: Self-pay | Admitting: Internal Medicine

## 2024-09-02 ENCOUNTER — Telehealth: Payer: Self-pay | Admitting: Physician Assistant

## 2024-09-02 NOTE — Telephone Encounter (Signed)
 Inbound call from patient stating she was prescribed medication trulance  to help her bowel movements and patient is stating the medication is not helping and would like to speak to nurse and be advised on what to do.  Requesting a call back Please advise Thank you

## 2024-09-03 ENCOUNTER — Ambulatory Visit: Admitting: Obstetrics and Gynecology

## 2024-09-03 NOTE — Telephone Encounter (Signed)
 Left message on mobile for patient to return the call.

## 2024-09-04 NOTE — Telephone Encounter (Signed)
 Left message on mobile for patient to return the call.

## 2024-09-05 ENCOUNTER — Ambulatory Visit: Admitting: Internal Medicine

## 2024-09-05 NOTE — Telephone Encounter (Signed)
 Spoke to patient. Patient states the Trulance  has helped with constipation but not as well as she feels it should. Patient reports still having issues with constipation & passing her bowels. Patient inquired if she could do anything else to help with this. Denies any bleeding with BM's. Please advise.

## 2024-09-07 ENCOUNTER — Other Ambulatory Visit: Payer: Self-pay | Admitting: Internal Medicine

## 2024-09-07 DIAGNOSIS — I4891 Unspecified atrial fibrillation: Secondary | ICD-10-CM

## 2024-09-08 ENCOUNTER — Other Ambulatory Visit: Payer: Self-pay | Admitting: Internal Medicine

## 2024-09-08 NOTE — Telephone Encounter (Signed)
 Eliquis  2.5mg  refill request received. Patient is 88 years old, weight-73.9kg, Crea-2.39 on 05/13/24, Diagnosis-Afib, and last seen by Dr. Okey on 06/06/24. Dose is appropriate based on dosing criteria. Will send in refill to requested pharmacy.

## 2024-09-15 ENCOUNTER — Encounter: Payer: Self-pay | Admitting: Obstetrics and Gynecology

## 2024-09-15 ENCOUNTER — Ambulatory Visit: Admitting: Obstetrics and Gynecology

## 2024-09-15 VITALS — BP 122/76 | HR 68 | Temp 98.0°F | Ht 66.0 in | Wt 160.0 lb

## 2024-09-15 DIAGNOSIS — N9089 Other specified noninflammatory disorders of vulva and perineum: Secondary | ICD-10-CM

## 2024-09-15 NOTE — Progress Notes (Signed)
 88 y.o. postmenopausal female s/p TVH here for surveillance of vulvar lesion. Widowed. Pt here with daughter.  No LMP recorded. Patient has had a hysterectomy.   She has no concerns and denies any vulvar pain, PMB.  11/29/23 CT ab/pelvis (LLQ pain): Reproductive: Status post hysterectomy. No adnexal masses. Left subcutaneous soft tissue density 2 cm lesion in the vulva, likely a cyst which is only partially imaged. This could be assessed further with ultrasound.  01/01/24 TVUS: FINDINGS: Ultrasound demonstrates a hypoechoic mass measuring 2.6 x 1.1 x 1.3 cm consistent with a solid mass in the left labia majora. Findings correlate with the prior CT findings   IMPRESSION: Solid soft tissue mass as described. Differential diagnosis may be that of a benign labial neoplastic lesion, including uncommon entities such as an angio fibroblastoma. Differential diagnosis could include a leiomyoma versus other mesenchymal tumors. And inspissated Bartholin cyst, or hematoma versus a nerve sheath tumor.  GYN HISTORY: No significant history  OB History  Gravida Para Term Preterm AB Living  5 4 4  1 5   SAB IAB Ectopic Multiple Live Births  1    4    # Outcome Date GA Lbr Len/2nd Weight Sex Type Anes PTL Lv  5 Term      Vag-Spont   LIV  4 Term      Vag-Spont   LIV  3 Term      Vag-Spont   LIV  2 Term      Vag-Spont   LIV  1 SAB             Past Medical History:  Diagnosis Date   Allergic rhinitis    Anterior chest wall pain    Anxiety    Asthma    Chronic diastolic CHF (congestive heart failure) (HCC)    Echo 01/2020: EF 60-65, normal wall motion, mild LVH, normal RV SF, RVSP 52.6 (moderate elevation), severe LAE, moderate RAE, trivial MR, mild MS (mean gradient 5.5 mmHg), mild aortic valve sclerosis (no AS); elevated E/e' c/w elevated LVEDP   Cough    DM type 2 (diabetes mellitus, type 2) (HCC)    GERD (gastroesophageal reflux disease)    History of diverticulitis of colon    HTN  (hypertension)    Hyperlipidemia    IBS (irritable bowel syndrome)    Iron deficiency anemia    Mitral stenosis 04/17/2022   Echo 04/2022: EF 55-60, no RWMA, mild LVH, normal RVSF, normal PASP, moderate LAE, moderate mitral stenosis (mean gradient 4 mmHg), trivial MR, trivial AI, AV sclerosis without stenosis   Osteoporosis, unspecified    Renal insufficiency 07/31/2017   UTI (urinary tract infection)     Past Surgical History:  Procedure Laterality Date   CARDIOVASCULAR STRESS TEST  02/25/04   CORONARY BALLOON ANGIOPLASTY N/A 12/28/2021   Procedure: CORONARY BALLOON ANGIOPLASTY;  Surgeon: Verlin Lonni BIRCH, MD;  Location: [facility];  Service: Cardiovascular;  Laterality: N/A;   CORONARY STENT INTERVENTION N/A 12/28/2021   Procedure: CORONARY STENT INTERVENTION;  Surgeon: Verlin Lonni BIRCH, MD;  Location: [facility];  Service: Cardiovascular;  Laterality: N/A;   ESOPHAGOGASTRODUODENOSCOPY  12/26/01   PACEMAKER IMPLANT N/A 08/02/2020   Procedure: PACEMAKER IMPLANT;  Surgeon: Fernande Elspeth BROCKS, MD;  Location[facility] INVASIVE CV LAB;  Service: Cardiovascular;  Laterality: N/A;   RIGHT OOPHORECTOMY  jan 2010   RIGHT/LEFT HEART CATH AND CORONARY ANGIOGRAPHY N/A 12/28/2021   Procedure: RIGHT/LEFT HEART CATH AND CORONARY ANGIOGRAPHY;  Surgeon: Verlin Lonni  D, MD;  Location: [facility];  Service: Cardiovascular;  Laterality: N/A;    Current Outpatient Medications on File Prior to Visit  Medication Sig Dispense Refill   acetaminophen  (TYLENOL ) 325 MG tablet Take 650 mg by mouth every 6 (six) hours as needed for pain.     albuterol  (PROAIR  HFA) 108 (90 Base) MCG/ACT inhaler 2 puffs every 4 hours as needed only  if your can't catch your breath 18 g 11   albuterol  (PROVENTIL ) (2.5 MG/3ML) 0.083% nebulizer solution Take 3 mLs (2.5 mg total) by nebulization every 6 (six) hours as needed for wheezing or shortness of breath. 150 mL 1   amoxicillin -clavulanate  (AUGMENTIN ) 875-125 MG tablet Take 1 tablet by mouth 2 (two) times daily. 20 tablet 0   apixaban  (ELIQUIS ) 2.5 MG TABS tablet TAKE 1 TABLET BY MOUTH TWICE A DAY 60 tablet 10   Ascorbic Acid (VITAMIN C) 1000 MG tablet Take 1,000 mg by mouth daily.     atorvastatin  (LIPITOR ) 80 MG tablet TAKE 1 TABLET BY MOUTH EVERY DAY 90 tablet 3   Azelastine  HCl 137 MCG/SPRAY SOLN PLACE 2 SPRAYS INTO BOTH NOSTRILS 2 (TWO) TIMES DAILY AS NEEDED FOR ALLERGIES. USE IN EACH NOSTRIL AS DIRECTED 90 mL 1   BD VEO INSULIN  SYRINGE U/F 31G X 15/64 0.5 ML MISC Use as instructed 100 each 3   Blood Glucose Monitoring Suppl (ONETOUCH VERIO FLEX SYSTEM) w/Device KIT USE AS DIRECTED 1 kit .   budesonide -formoterol  (SYMBICORT ) 160-4.5 MCG/ACT inhaler TAKE 2 PUFFS FIRST THING IN MORNING AND THEN ANOTHER 2 PUFFS ABOUT 12 HOURS LATER. 30.6 each 6   butalbital -acetaminophen -caffeine  (FIORICET) 50-325-40 MG tablet Take 1 tablet by mouth every 6 (six) hours as needed for headache. 20 tablet 0   carvedilol  (COREG ) 3.125 MG tablet TAKE 1 TABLET BY MOUTH TWICE A DAY WITH FOOD 180 tablet 3   cetirizine  (ZYRTEC  ALLERGY ) 10 MG tablet Take 1 tablet (10 mg total) by mouth daily. 30 tablet 5   cholecalciferol (VITAMIN D3) 25 MCG (1000 UNIT) tablet Take 1,000 Units by mouth daily. Isn't taking regularly     clopidogrel  (PLAVIX ) 75 MG tablet Take 1 tablet (75 mg total) by mouth daily with breakfast. 90 tablet 3   diclofenac  Sodium (VOLTAREN ) 1 % GEL Apply 4 g topically 4 (four) times daily as needed (pain). 150 g 0   famotidine  (PEPCID ) 20 MG tablet TAKE 1 TABLET BY MOUTH TWICE A DAY 180 tablet 2   FARXIGA  5 MG TABS tablet TAKE 1 TABLET BY MOUTH EVERY DAY BEFORE BREAKFAST 30 tablet 3   fluticasone  (FLONASE ) 50 MCG/ACT nasal spray SPRAY 2 SPRAYS INTO EACH NOSTRIL EVERY DAY 48 mL 1   furosemide  (LASIX ) 40 MG tablet TAKE 1 TABLET BY MOUTH TWICE A DAY 180 tablet 3   glucose blood (ONETOUCH VERIO) test strip 1 each by Other route 2 (two) times daily.  And lancets 2/day 200 each 3   glucose blood test strip Check blood sugar twice a day 100 each 12   hydrALAZINE  (APRESOLINE ) 25 MG tablet TAKE 3 TABLETS (75 MG TOTAL) BY MOUTH 3 TIMES A DAY 810 tablet 3   insulin  lispro (HUMALOG ) 100 UNIT/ML injection GIVE 3 UNITS WITH BREAKFAST, AND 18 UNITS WITH SUPPER 10 mL 11   latanoprost  (XALATAN ) 0.005 % ophthalmic solution Place 1 drop into both eyes at bedtime.     lidocaine  (LIDODERM ) 5 % Place 1 patch onto the skin daily. Remove & Discard patch within 12 hours or  as directed by MD 30 patch 0   methocarbamol (ROBAXIN) 500 MG tablet Take 500 mg by mouth 4 (four) times daily.     methylPREDNISolone  (MEDROL  DOSEPAK) 4 MG TBPK tablet Take with signs of chronic sinusitis and take as directed 1 each 1   montelukast  (SINGULAIR ) 10 MG tablet Take 1 tablet (10 mg total) by mouth at bedtime. 90 tablet 3   nitroGLYCERIN  (NITROSTAT ) 0.4 MG SL tablet Place 1 tablet (0.4 mg total) under the tongue every 5 (five) minutes x 3 doses as needed for chest pain. 25 tablet 2   ondansetron  (ZOFRAN ) 8 MG tablet TAKE 1 TABLET BY MOUTH EVERY 8 HOURS AS NEEDED FOR NAUSEA OR VOMITING. 18 tablet 0   pantoprazole  (PROTONIX ) 40 MG tablet TAKE 1 TABLET BY MOUTH EVERY DAY 180 tablet 3   Plecanatide  (TRULANCE ) 3 MG TABS Take 1 tablet (3 mg total) by mouth daily. 90 tablet 0   potassium chloride  (KLOR-CON ) 10 MEQ tablet TAKE 1 TABLET BY MOUTH EVERY DAY 90 tablet 3   sucralfate  (CARAFATE ) 1 g tablet Take 1 tablet (1 g total) by mouth 3 (three) times daily as needed for up to 60 doses. 60 tablet 0   Syringe/Needle, Disp, (SYRINGE 3CC/25GX1) 25G X 1 3 ML MISC Use to inject into the skin 2x a day 100 each 3   tiZANidine  (ZANAFLEX ) 2 MG tablet Take 1 tablet (2 mg total) by mouth every 8 (eight) hours as needed for muscle spasms. 30 tablet 0   tiZANidine  (ZANAFLEX ) 2 MG tablet Take 1 tablet (2 mg total) by mouth every 8 (eight) hours as needed for muscle spasms. 30 tablet 0   traZODone   (DESYREL ) 50 MG tablet TAKE 0.5 TABLETS BY MOUTH AT BEDTIME AS NEEDED FOR SLEEP. 15 tablet 0   vitamin B-12 (CYANOCOBALAMIN ) 100 MCG tablet Take 100 mcg by mouth daily.     Glucagon  (BAQSIMI  ONE PACK) 3 MG/DOSE POWD Place 1 Device into the nose as needed (Low blood sugar with impaired consciousness). (Patient not taking: Reported on 09/15/2024) 2 each 3   ULTRAM  50 MG tablet Take 50 mg by mouth every 6 (six) hours as needed. (Patient not taking: Reported on 09/15/2024)     Current Facility-Administered Medications on File Prior to Visit  Medication Dose Route Frequency Provider Last Rate Last Admin   denosumab  (PROLIA ) injection 60 mg  60 mg Subcutaneous Once Jason Leita Repine, FNP        Allergies  Allergen Reactions   Aspirin  Shortness Of Breath and Other (See Comments)    Caused asthma symptoms   Ramipril Cough   Doxycycline      Nausea      PE Today's Vitals   09/15/24 1435  BP: 122/76  Pulse: 68  Temp: 98 F (36.7 C)  TempSrc: Oral  SpO2: 99%  Weight: 160 lb (72.6 kg)  Height: 5' 6 (1.676 m)    Body mass index is 25.82 kg/m.  Physical Exam Vitals reviewed. Exam conducted with a chaperone present.  Constitutional:      General: She is not in acute distress.    Appearance: Normal appearance.  HENT:     Head: Normocephalic and atraumatic.     Nose: Nose normal.  Eyes:     Extraocular Movements: Extraocular movements intact.     Conjunctiva/sclera: Conjunctivae normal.  Pulmonary:     Effort: Pulmonary effort is normal.  Genitourinary:    Exam position: Lithotomy position.     Labia:  Right: No rash, tenderness, lesion or injury.        Left: No rash, tenderness or injury.       Comments: Left subcutaneous vulvar mass with normal overlying skin, nontender, 3 (L)x1.5 (w)cm, increased in length dimension Musculoskeletal:        General: Normal range of motion.     Cervical back: Normal range of motion.  Lymphadenopathy:     Lower Body: No right  inguinal adenopathy. No left inguinal adenopathy.  Neurological:     General: No focal deficit present.     Mental Status: She is alert.  Psychiatric:        Mood and Affect: Mood normal.        Behavior: Behavior normal.       Assessment and Plan:        Vulvar mass -     US  PELVIS LIMITED (TRANSABDOMINAL ONLY); Future  Patient with incidental left vulvar mass found on CT abdomen pelvis during evaluation for chronic left lower quadrant pain in February 2025. Follow-up TVUS revealed 2.6 cm vulvar mass with benign characteristics.  Presents for 6 moths clinical f/u today. Exam today remains consistent with a benign, left upper vulvar lesion, most likely a inclusion cyst, however slight increase in size. Recommend follow-up US  in March as 54yr f/u Patient and daughter are in agreement.  15 min  total time was spent for this patient encounter, including preparation, face-to-face counseling with the patient, coordination of care, and documentation of the encounter.   Vera LULLA Pa, MD

## 2024-09-16 ENCOUNTER — Ambulatory Visit: Admitting: Podiatry

## 2024-09-16 DIAGNOSIS — M7751 Other enthesopathy of right foot: Secondary | ICD-10-CM

## 2024-09-16 MED ORDER — TRIAMCINOLONE ACETONIDE 40 MG/ML IJ SUSP
40.0000 mg | Freq: Once | INTRAMUSCULAR | Status: AC
  Administered 2024-09-16: 10:00:00 40 mg

## 2024-09-16 NOTE — Progress Notes (Signed)
 Patient presents with complaint of pain between the 4th and 5th toes and on the dorsal aspect of the fifth toe right.  Does not recall any injury to it.  Has not noticed any drainage or redness.  No fever chills nausea or vomiting.   Physical exam:  General appearance: Pleasant, and in no acute distress. AOx3.  Vascular: Pedal pulses: DP 0/4 bilaterally, PT 2/4 bilaterally.  Mild edema lower legs bilaterally. Capillary fill time immediate bilaterally.  Neurological: Grossly intact bilaterally  Dermatologic:   Inflamed area between the 4th and 5th toes on the right in the interdigital space.  No signs of infection or skin breakdown.  No maceration present.  Musculoskeletal: Tenderness on the dorsal aspect of the PIPJ and the medial aspect of the PIPJ of the fifth toe and the lateral aspect of the fourth MTP on the fourth toe.   Diagnosis: 1.  Bursitis right foot  Plan: -Discussed with the pain in the interdigital space and on the toes.  Will give her a interdigital pad to use. -injected 2cc mixture of 1cc 0.5% Marcaine , 1cc triamcinolone  40 mg / 1 mL at inflamed bursa at PIPJ of fifth toe right   Return regularly scheduled appointment for RFC with Dr. Gregor

## 2024-09-19 ENCOUNTER — Other Ambulatory Visit: Payer: Self-pay

## 2024-09-19 ENCOUNTER — Ambulatory Visit: Admitting: Internal Medicine

## 2024-09-19 VITALS — BP 130/74 | HR 71 | Temp 97.6°F | Ht 62.25 in | Wt 164.8 lb

## 2024-09-19 DIAGNOSIS — J3089 Other allergic rhinitis: Secondary | ICD-10-CM | POA: Diagnosis not present

## 2024-09-19 DIAGNOSIS — J302 Other seasonal allergic rhinitis: Secondary | ICD-10-CM | POA: Diagnosis not present

## 2024-09-19 DIAGNOSIS — R7689 Other specified abnormal immunological findings in serum: Secondary | ICD-10-CM | POA: Diagnosis not present

## 2024-09-19 MED ORDER — CETIRIZINE HCL 10 MG PO TABS: 10.0000 mg | ORAL_TABLET | Freq: Every day | ORAL | 3 refills | Status: AC | PRN

## 2024-09-19 MED ORDER — AZELASTINE HCL 137 MCG/SPRAY NA SOLN: 2.0000 | Freq: Two times a day (BID) | NASAL | 3 refills | Status: AC | PRN

## 2024-09-19 MED ORDER — FLUTICASONE PROPIONATE 50 MCG/ACT NA SUSP: 2.0000 | Freq: Every day | NASAL | 3 refills | Status: AC

## 2024-09-19 NOTE — Patient Instructions (Addendum)
 Allergic Rhinitis: - Positive skin test 06/2023: cockroach, ragweed  - Use nasal saline rinses before nose sprays such as with Neilmed Sinus Rinse.  Use distilled water.   - Use Flonase  2 sprays each nostril daily. Aim upward and outward. - Use Azelastine  2 sprays each nostril twice daily as needed for congestion/drainage.  Aim upward and outward.   - Use Zyrtec  10 mg daily as needed for runny nose, sneezing, itchy watery eyes, itchy throat, itchy ears.   - Of note, she is not an allergy  shot candidate due to her significant cardiac history with NSTEMI s/p PCI, complete heart block s/p PPM, paroxysmal Afib.     Elevated IgE - Elevated IgE with normal eosinophil count is nonspecific.  No further workup needed.

## 2024-09-19 NOTE — Progress Notes (Signed)
 "  FOLLOW UP Date of Service/Encounter:  09/19/2024   Subjective:  [name] (DOB: 1936-09-24) is a 88 y.o. female who [facility] for follow up for allergic rhinitis and hx of elevated IgE.   History obtained from: chart review and patient and daughter. Last seen on 03/05/2024 with [name]- downtrending IgE in the psat and discussed use of Flonase , Azelastine  PRN and Zyrtec  for allergic rhinitis.   Notes having some cough, post nasal drainage, throat clearing and itchy throat. Using Flonase  and Zyrtec  PRN rather than daily.  Not on Azelastine  much.  Has watery eyes but notes its related to her glaucoma and is on eye drops for this.   Past Medical History: Past Medical History:  Diagnosis Date   Allergic rhinitis    Anterior chest wall pain    Anxiety    Asthma    Chronic diastolic CHF (congestive heart failure) (HCC)    Echo 01/2020: EF 60-65, normal wall motion, mild LVH, normal RV SF, RVSP 52.6 (moderate elevation), severe LAE, moderate RAE, trivial MR, mild MS (mean gradient 5.5 mmHg), mild aortic valve sclerosis (no AS); elevated E/e' c/w elevated LVEDP   Cough    DM type 2 (diabetes mellitus, type 2) (HCC)    GERD (gastroesophageal reflux disease)    History of diverticulitis of colon    HTN (hypertension)    Hyperlipidemia    IBS (irritable bowel syndrome)    Iron deficiency anemia    Mitral stenosis 04/17/2022   Echo 04/2022: EF 55-60, no RWMA, mild LVH, normal RVSF, normal PASP, moderate LAE, moderate mitral stenosis (mean gradient 4 mmHg), trivial MR, trivial AI, AV sclerosis without stenosis   Osteoporosis, unspecified    Renal insufficiency 07/31/2017   UTI (urinary tract infection)     Objective:  BP 130/74 (BP Location: Right Arm, Patient Position: Sitting, Cuff Size: Normal)   Pulse 71   Temp 97.6 F (36.4 C) (Temporal)   Ht 5' 2.25 (1.581 m)   Wt 164 lb 12.8 oz (74.8 kg)   SpO2 99%   BMI 29.90 kg/m  Body mass  index is 29.9 kg/m. Physical Exam: GEN: alert, well developed HEENT: clear conjunctiva, nose with mild inferior turbinate hypertrophy, pink nasal mucosa, no rhinorrhea, + cobblestoning HEART: regular rate and rhythm, no murmur LUNGS: clear to auscultation bilaterally, no coughing, unlabored respiration SKIN: no rashes or lesions  Assessment:   1. Seasonal and perennial allergic rhinitis   2. Elevated IgE level     Plan/Recommendations:   Allergic Rhinitis: - Not well controlled, some PND with cough.  Discussed restarting Flonase  daily.  - Positive skin test 06/2023: cockroach, ragweed  - Use nasal saline rinses before nose sprays such as with Neilmed Sinus Rinse.  Use distilled water.   - Use Flonase  2 sprays each nostril daily. Aim upward and outward. - Use Azelastine  2 sprays each nostril twice daily as needed for congestion/drainage.  Aim upward and outward.   - Use Zyrtec  10 mg daily as needed for runny nose, sneezing, itchy watery eyes, itchy throat, itchy ears.   - Of note, she is not an allergy  shot candidate due to her significant cardiac history with NSTEMI s/p PCI, complete heart block s/p PPM, paroxysmal Afib.     Elevated IgE - Elevated IgE with normal eosinophil count is nonspecific.  No further workup needed - IgE 05/2023 919--> IgE 09/05/23 623- trending down - Normal serum electrophoresis, no M spike observed.  Elevated kappa and lambda light chains with normal ratio is likely due to her chronic kidney disease.    Cough Variant Asthma - Follow up with Dr. Darlean.  On Symbicort  and PRN Albuterol .       Return in about 1 year (around 09/19/2025).  Arleta Blanch, MD [facility]       "

## 2024-10-14 ENCOUNTER — Telehealth: Payer: Self-pay | Admitting: Internal Medicine

## 2024-10-14 LAB — LAB REPORT - SCANNED: EGFR: 20

## 2024-10-14 NOTE — Telephone Encounter (Signed)
" °*  STAT* If patient is at the pharmacy, call can be transferred to refill team.   1. Which medications need to be refilled? (please list name of each medication and dose if known)   potassium chloride  (KLOR-CON ) 10 MEQ tablet     2. Would you like to learn more about the convenience, safety, & potential cost savings by using the [facility]?    3. Are you open to using the [facility] (Type [facility].    4. Which pharmacy/location (including street and city if local pharmacy) is medication to be sent to? [id number] - [place], [place] - [address]     5. Do they need a 30 day or 90 day supply? 90  "

## 2024-10-16 NOTE — Telephone Encounter (Signed)
 Returned call to pt to make her aware that we had sent a year supply [facility] to [facility] in 07/[facility] for 1 year.  She was very appreciative of the call back and will contact [facility].

## 2024-10-17 ENCOUNTER — Ambulatory Visit: Admitting: Internal Medicine

## 2024-10-17 ENCOUNTER — Telehealth: Payer: Self-pay | Admitting: Internal Medicine

## 2024-10-17 ENCOUNTER — Encounter: Payer: Self-pay | Admitting: Internal Medicine

## 2024-10-17 VITALS — BP 150/74 | HR 91 | Ht 66.0 in | Wt 164.0 lb

## 2024-10-17 DIAGNOSIS — R1310 Dysphagia, unspecified: Secondary | ICD-10-CM

## 2024-10-17 DIAGNOSIS — K219 Gastro-esophageal reflux disease without esophagitis: Secondary | ICD-10-CM | POA: Diagnosis not present

## 2024-10-17 DIAGNOSIS — K8020 Calculus of gallbladder without cholecystitis without obstruction: Secondary | ICD-10-CM

## 2024-10-17 DIAGNOSIS — R110 Nausea: Secondary | ICD-10-CM

## 2024-10-17 DIAGNOSIS — K5904 Chronic idiopathic constipation: Secondary | ICD-10-CM

## 2024-10-17 DIAGNOSIS — K581 Irritable bowel syndrome with constipation: Secondary | ICD-10-CM

## 2024-10-17 NOTE — Progress Notes (Signed)
 "    10/17/2024 [name] [id number] 1936/09/26  ASSESSMENT AND PLAN:  Nausea without vomiting GERD Gallstones History of dysphagia with water, not with food, no odynophagia EGD 2009 with candidal esophagitis and nonerosive gastritis 02/20/2014 GES normal 12/25/2021 HIDA scan normal 06/22/2023 barium swallow presbyesophagus tertiary contraction corkscrew appearance mid and distal esophagus initially substantial delayed emptying esophagus and stomach small type I hiatal hernia 13 mm barium tablet impacted just above the hiatal hernia Patient's reflux is under reasonable control currently. Nausea has persisted but is not too bad currently. She did have some difficulty with the gastroparesis diet.  -pantoprazole  daily and pepcid  - Previously given gastroparesis diet - continue to maximize bowel regimen, will try trulance  - Patinet continues to decline EGD due to concerns for dilation. She will let me know if she changes her mind in the future - RTC 3 months. Patient prefers an early morning appt  IBS with constipation 12/18/2019 colonoscopy excellent prep unremarkable other than rectal prolapse 05/02/2024 CTAP W0 cholelithiasis, diverticulosis without diverticulitis small hiatal hernia and large amount of stool throughout the colon Failed Amitiza , linzess  Seems to be doing well on Trulance  currently - Cont trulance  daily - Miralax PRN  Diabetes with history of DKA Uncontrolled sugars could be contributing to the nausea Follow up endocrinology/PCP Gastroparesis diet given, check GES Lab Results  Component Value Date   HGBA1C 7.4 (A) 05/13/2024    CKD stage IV Uremia could be contributing to the nausea. She did have a recent worsening of her kidney function and plans to follow up with nephrology for this Follow up endocrinology/PCP/nephrology  Combined systolic diastolic heart failure/mitral stenosis 04/2024 Echo EF 55%, LVH, mild elevated pulmonary pressure, reduced RVSF, moderate  MS  CAD On Plavix   A-fib with history of complete heart block On Eliquis   Patient Care Team: Jason Leita Repine, FNP (Inactive) as PCP - General (Internal Medicine) Okey Vina GAILS, MD as PCP - Cardiology (Cardiology) Luis Purchase, MD as Attending Physician (Gastroenterology) Kay Kemps, MD as Attending Physician (Orthopedic Surgery) Darlean Ozell NOVAK, MD as Attending Physician (Pulmonary Disease) Roz Anes, MD as Consulting Physician (Ophthalmology) Dallie Vera GAILS, MD as Consulting Physician (Obstetrics and Gynecology) Norine Manuelita LABOR, MD as Attending Physician (Nephrology)  HISTORY OF PRESENT ILLNESS: 89 y.o. female with a past medical history of HFpEF, A-fib on Eliquis , CAD on Plavix , DM, GERD, asthma, IBS, prior diverticulitis presents for evaluation of nausea without vomiting.  Interval History: Patient presents with her daughter to clinic today. Overall she is feeling pretty good. She has done well on the Trulance . She does not take the Trulance  ever day. When she doesn't take her Trulance , she takes Miralax. On average she has two BMs per week. Grease seems to irritate her stomach. She couldn't follow the soft diet because she likes her fried chicken. Trouble swallowing is the same but not worsening. Protonix  and Pepcid  seems to be helping with reflux. Not having ab pain right now but still has LLQ ab pain on occasion. Patient has still struggled with drinking enough water  RELEVANT GI HISTORY, IMAGING AND LABS: Results         Labs 05/2024: CBC with low platelets of 137. CMP with elevated Cr of 2.79 (above her baseline of 2-2.4)  Gastric emptying study 02/20/14: IMPRESSION:  Normal exam.    HIDA scan 12/25/21: IMPRESSION: Normal hepatobiliary scan, demonstrating patency of both the cystic and common bile ducts.   CT A/P w/o contrast 07/19/22: IMPRESSION: 1. No acute abdominopelvic findings.  2. Colonic diverticulosis without acute diverticulitis. 3.  Cholelithiasis without evidence of acute cholecystitis. 4. Punctate nonobstructing left lower pole renal calculus. 5. Small hiatal hernia. 6. Multichamber cardiomegaly with coronary artery calcifications and aortic valvular and mitral annular calcifications. 7. Similar bibasilar linear atelectasis/scarring with subsegmental mucous plugging and mild bronchiectasis left lower lobe. 8. Aortic Atherosclerosis (ICD10-I70.0).   Chest CT w/o contrast 04/27/23: IMPRESSION: 1. Bronchial wall thickening and mucous plugging in the lower lobes greatest in the left lower lobe. 2. Small hiatal hernia with mild wall thickening of the lower esophagus suggesting esophagitis. 3. Cholelithiasis.   Barium swallow 06/22/23: IMPRESSION: 1. Presbyesophagus with tertiary contractions and corkscrew appearance in the mid and distal esophagus. The contractions and intermittent spasms limit our ability to assess for esophageal narrowings. 2. Initially substantially delayed emptying of the esophagus into the stomach in the upright position. Suspected component of esophageal spasm. Subsequently the esophagus did spontaneously empty into the stomach in the upright position. 3. Small type 1 hiatal hernia. 4. 13 mm barium tablet impacted just above the hiatal hernia.   CT A/P w/o contrast 11/29/23: IMPRESSION: 1. Cholelithiasis. 2. Hepatic cysts. 3. Left vulvar lesion, likely a cyst. This could be assessed further with ultrasound. 4. Aortic atherosclerosis.  CT A/P w/o contrast 05/02/24: IMPRESSION: 1. No acute localizing process in the abdomen or pelvis. 2. Cholelithiasis. 3. Colonic diverticulosis. 4. Large amount of stool throughout the colon. 5. Small hiatal hernia.   EGD 04/02/08:    Colonoscopy 12/18/19: Excellent prep.   CBC    Component Value Date/Time   WBC 9.6 05/13/2024 1617   RBC 4.87 05/13/2024 1617   HGB 13.7 05/13/2024 1617   HGB 13.6 11/08/2023 1402   HCT 41.4 05/13/2024 1617   HCT  40.5 11/08/2023 1402   PLT 137.0 (L) 05/13/2024 1617   PLT 179 11/08/2023 1402   MCV 85.1 05/13/2024 1617   MCV 88 11/08/2023 1402   MCH 28.0 05/07/2024 1845   MCHC 33.1 05/13/2024 1617   RDW 17.2 (H) 05/13/2024 1617   RDW 14.9 11/08/2023 1402   LYMPHSABS 1.0 05/13/2024 1617   LYMPHSABS 1.0 11/08/2023 1402   MONOABS 0.7 05/13/2024 1617   EOSABS 0.0 05/13/2024 1617   EOSABS 0.2 11/08/2023 1402   BASOSABS 0.0 05/13/2024 1617   BASOSABS 0.1 11/08/2023 1402   Recent Labs    11/08/23 1402 02/19/24 1926 04/07/24 1510 05/07/24 1845 05/13/24 1617  HGB 13.6 13.0 11.7* 12.4 13.7    CMP     Component Value Date/Time   NA 137 05/13/2024 1617   NA 141 11/08/2023 1402   K 4.9 05/13/2024 1617   CL 100 05/13/2024 1617   CO2 23 05/13/2024 1617   GLUCOSE 326 (H) 05/13/2024 1617   BUN 51 (H) 05/13/2024 1617   BUN 30 (H) 11/08/2023 1402   CREATININE 2.79 (H) 05/13/2024 1617   CREATININE 1.74 (H) 08/28/2016 1042   CALCIUM  9.6 05/13/2024 1617   CALCIUM  10.2 07/06/2011 1144   PROT 6.8 05/13/2024 1617   PROT 6.8 09/05/2023 1206   ALBUMIN 4.0 05/13/2024 1617   AST 14 05/13/2024 1617   ALT 16 05/13/2024 1617   ALKPHOS 72 05/13/2024 1617   BILITOT 0.6 05/13/2024 1617   GFRNONAA 23 (L) 05/07/2024 1845   GFRAA 28 (L) 08/06/2020 1526      Latest Ref Rng & Units 05/13/2024    4:17 PM 04/07/2024    3:10 PM 09/05/2023   12:06 PM  Hepatic Function  Total Protein 6.0 - 8.3  g/dL 6.8  6.6  6.8   Albumin 3.5 - 5.2 g/dL 4.0  3.3    AST 0 - 37 U/L 14  17    ALT 0 - 35 U/L 16  12    Alk Phosphatase 39 - 117 U/L 72  85    Total Bilirubin 0.2 - 1.2 mg/dL 0.6  0.5        Current Medications:   Current Outpatient Medications (Endocrine & Metabolic):    FARXIGA  5 MG TABS tablet, TAKE 1 TABLET BY MOUTH EVERY DAY BEFORE BREAKFAST   insulin  lispro (HUMALOG ) 100 UNIT/ML injection, GIVE 3 UNITS WITH BREAKFAST, AND 18 UNITS WITH SUPPER   methylPREDNISolone  (MEDROL  DOSEPAK) 4 MG TBPK tablet, Take with  signs of chronic sinusitis and take as directed  Current Facility-Administered Medications (Endocrine & Metabolic):    denosumab  (PROLIA ) injection 60 mg  Current Outpatient Medications (Cardiovascular):    atorvastatin  (LIPITOR ) 80 MG tablet, TAKE 1 TABLET BY MOUTH EVERY DAY   carvedilol  (COREG ) 3.125 MG tablet, TAKE 1 TABLET BY MOUTH TWICE A DAY WITH FOOD   furosemide  (LASIX ) 40 MG tablet, TAKE 1 TABLET BY MOUTH TWICE A DAY   hydrALAZINE  (APRESOLINE ) 25 MG tablet, TAKE 3 TABLETS (75 MG TOTAL) BY MOUTH 3 TIMES A DAY   nitroGLYCERIN  (NITROSTAT ) 0.4 MG SL tablet, Place 1 tablet (0.4 mg total) under the tongue every 5 (five) minutes x 3 doses as needed for chest pain.  Current Outpatient Medications (Respiratory):    albuterol  (PROAIR  HFA) 108 (90 Base) MCG/ACT inhaler, 2 puffs every 4 hours as needed only  if your can't catch your breath   albuterol  (PROVENTIL ) (2.5 MG/3ML) 0.083% nebulizer solution, Take 3 mLs (2.5 mg total) by nebulization every 6 (six) hours as needed for wheezing or shortness of breath.   Azelastine  HCl 137 MCG/SPRAY SOLN, Place 2 sprays into the nose 2 (two) times daily as needed (congestion, drainage, runny nose, sneezing).   budesonide -formoterol  (SYMBICORT ) 160-4.5 MCG/ACT inhaler, TAKE 2 PUFFS FIRST THING IN MORNING AND THEN ANOTHER 2 PUFFS ABOUT 12 HOURS LATER.   cetirizine  (ZYRTEC  ALLERGY ) 10 MG tablet, Take 1 tablet (10 mg total) by mouth daily as needed for allergies or rhinitis.   fluticasone  (FLONASE ) 50 MCG/ACT nasal spray, Place 2 sprays into both nostrils daily. (Patient taking differently: Place 2 sprays into both nostrils daily as needed.)   montelukast  (SINGULAIR ) 10 MG tablet, Take 1 tablet (10 mg total) by mouth at bedtime.  Current Outpatient Medications (Analgesics):    acetaminophen  (TYLENOL ) 325 MG tablet, Take 650 mg by mouth every 6 (six) hours as needed for pain.   butalbital -acetaminophen -caffeine  (FIORICET) 50-325-40 MG tablet, Take 1 tablet by  mouth every 6 (six) hours as needed for headache.  Current Outpatient Medications (Hematological):    apixaban  (ELIQUIS ) 2.5 MG TABS tablet, TAKE 1 TABLET BY MOUTH TWICE A DAY   clopidogrel  (PLAVIX ) 75 MG tablet, Take 1 tablet (75 mg total) by mouth daily with breakfast.   vitamin B-12 (CYANOCOBALAMIN ) 100 MCG tablet, Take 100 mcg by mouth daily.  Current Outpatient Medications (Other):    amoxicillin -clavulanate (AUGMENTIN ) 875-125 MG tablet, Take 1 tablet by mouth 2 (two) times daily. (Patient taking differently: Take 1 tablet by mouth 2 (two) times daily. As needed)   Ascorbic Acid (VITAMIN C) 1000 MG tablet, Take 1,000 mg by mouth daily.   BD VEO INSULIN  SYRINGE U/F 31G X 15/64 0.5 ML MISC, Use as instructed   Blood Glucose Monitoring Suppl (ONETOUCH VERIO FLEX  SYSTEM) w/Device KIT, USE AS DIRECTED   cholecalciferol (VITAMIN D3) 25 MCG (1000 UNIT) tablet, Take 1,000 Units by mouth daily. Isn't taking regularly   diclofenac  Sodium (VOLTAREN ) 1 % GEL, Apply 4 g topically 4 (four) times daily as needed (pain).   famotidine  (PEPCID ) 20 MG tablet, TAKE 1 TABLET BY MOUTH TWICE A DAY   glucose blood (ONETOUCH VERIO) test strip, 1 each by Other route 2 (two) times daily. And lancets 2/day   glucose blood test strip, Check blood sugar twice a day   latanoprost  (XALATAN ) 0.005 % ophthalmic solution, Place 1 drop into both eyes at bedtime.   lidocaine  (LIDODERM ) 5 %, Place 1 patch onto the skin daily. Remove & Discard patch within 12 hours or as directed by MD   methocarbamol (ROBAXIN) 500 MG tablet, Take 500 mg by mouth 4 (four) times daily.   ondansetron  (ZOFRAN ) 8 MG tablet, TAKE 1 TABLET BY MOUTH EVERY 8 HOURS AS NEEDED FOR NAUSEA OR VOMITING.   pantoprazole  (PROTONIX ) 40 MG tablet, TAKE 1 TABLET BY MOUTH EVERY DAY   Plecanatide  (TRULANCE ) 3 MG TABS, Take 1 tablet (3 mg total) by mouth daily.   potassium chloride  (KLOR-CON ) 10 MEQ tablet, TAKE 1 TABLET BY MOUTH EVERY DAY   sucralfate  (CARAFATE )  1 g tablet, Take 1 tablet (1 g total) by mouth 3 (three) times daily as needed for up to 60 doses.   Syringe/Needle, Disp, (SYRINGE 3CC/25GX1) 25G X 1 3 ML MISC, Use to inject into the skin 2x a day   tiZANidine  (ZANAFLEX ) 2 MG tablet, Take 1 tablet (2 mg total) by mouth every 8 (eight) hours as needed for muscle spasms.   tiZANidine  (ZANAFLEX ) 2 MG tablet, Take 1 tablet (2 mg total) by mouth every 8 (eight) hours as needed for muscle spasms.   traZODone  (DESYREL ) 50 MG tablet, TAKE 0.5 TABLETS BY MOUTH AT BEDTIME AS NEEDED FOR SLEEP.   Medical History:  Past Medical History:  Diagnosis Date   Allergic rhinitis    Anterior chest wall pain    Anxiety    Asthma    Chronic diastolic CHF (congestive heart failure) (HCC)    Echo 01/2020: EF 60-65, normal wall motion, mild LVH, normal RV SF, RVSP 52.6 (moderate elevation), severe LAE, moderate RAE, trivial MR, mild MS (mean gradient 5.5 mmHg), mild aortic valve sclerosis (no AS); elevated E/e' c/w elevated LVEDP   Cough    DM type 2 (diabetes mellitus, type 2) (HCC)    GERD (gastroesophageal reflux disease)    History of diverticulitis of colon    HTN (hypertension)    Hyperlipidemia    IBS (irritable bowel syndrome)    Iron deficiency anemia    Mitral stenosis 04/17/2022   Echo 04/2022: EF 55-60, no RWMA, mild LVH, normal RVSF, normal PASP, moderate LAE, moderate mitral stenosis (mean gradient 4 mmHg), trivial MR, trivial AI, AV sclerosis without stenosis   Osteoporosis, unspecified    Renal insufficiency 07/31/2017   UTI (urinary tract infection)    Allergies:  Allergies  Allergen Reactions   Aspirin  Shortness Of Breath and Other (See Comments)    Caused asthma symptoms   Ramipril Cough   Doxycycline      Nausea     Surgical History:  She  has a past surgical history that includes Esophagogastroduodenoscopy (12/26/01); Cardiovascular stress test (02/25/04); Right oophorectomy (jan 2010); PACEMAKER IMPLANT (N/A, 08/02/2020); RIGHT/LEFT  HEART CATH AND CORONARY ANGIOGRAPHY (N/A, 12/28/2021); CORONARY STENT INTERVENTION (N/A, 12/28/2021); and CORONARY BALLOON ANGIOPLASTY (N/A,  12/28/2021). Family History:  Her family history includes Cancer in her sister; Diabetes in her daughter; Hypertension in her mother; Lung cancer in her brother; Melanoma in her brother; Other in her father; Stroke in her mother.  PHYSICAL EXAM: BP (!) 150/74   Pulse 91   Ht 5' 6 (1.676 m)   Wt 164 lb (74.4 kg)   BMI 26.47 kg/m  Physical Exam   GENERAL APPEARANCE: elderly appearing, in no apparent distress. HEENT: No cervical lymphadenopathy, unremarkable thyroid , sclerae anicteric, conjunctiva pink. RESPIRATORY: Respiratory effort normal CARDIO: Regular rate ABDOMEN: Soft, non-distended, decreased bowel sounds, non-tender MUSCULOSKELETAL: Full range of motion, in wheel chair, without edema. SKIN: Dry, intact without rashes or lesions. No jaundice. NEURO: Alert, oriented, no focal deficits. PSYCH: Cooperative, normal mood and affect.      Rosario JAYSON Kidney, MD 10:42 AM  I spent 35 minutes of time, including in depth chart review, independent review of results as outlined above, communicating results with the patient directly, face-to-face time with the patient, coordinating care, and ordering studies and medications as appropriate, and documentation.  "

## 2024-10-17 NOTE — Patient Instructions (Signed)
 _______________________________________________________  If your blood pressure at your visit was 140/90 or greater, please contact your primary care physician to follow up on this.  _______________________________________________________  If you are age 87 or older, your body mass index should be between 23-30. Your Body mass index is 26.47 kg/m. If this is out of the aforementioned range listed, please consider follow up with your Primary Care Provider.  If you are age 20 or younger, your body mass index should be between 19-25. Your Body mass index is 26.47 kg/m. If this is out of the aformentioned range listed, please consider follow up with your Primary Care Provider.   ________________________________________________________  The [facility] providers would like to encourage you to use MYCHART to communicate with providers for non-urgent requests or questions.  Due to long hold times on the telephone, sending your provider a message by [facility] may be a faster and more efficient way to get a response.  Please allow 48 business hours for a response.  Please remember that this is for non-urgent requests.  _______________________________________________________  [facility] is using a team-based approach to care.  Your team is made up of your doctor and two to three APPS. Our APPS (Nurse Practitioners and Physician Assistants) work with your physician to ensure care continuity for you. They are fully qualified to address your health concerns and develop a treatment plan. They communicate directly with your gastroenterologist to care for you. Seeing the Advanced Practice Practitioners on your physician's team can help you by facilitating care more promptly, often allowing for earlier appointments, access to diagnostic testing, procedures, and other specialty referrals.   Please follow up in 3 months. Give us  a call at [phone] to schedule an appointment. Call in February for a  April appointment  It was a pleasure to see you today!  Thank you for trusting me with your gastrointestinal care!

## 2024-10-17 NOTE — Telephone Encounter (Signed)
" °*  STAT* If patient is at the pharmacy, call can be transferred to refill team.   1. Which medications need to be refilled? (please list name of each medication and dose if known)  potassium chloride  (KLOR-CON ) 10 MEQ tablet    2. Which pharmacy/location (including street and city if local pharmacy) is medication to be sent to? [id number] - [place], [place] - [address]   3. Do they need a 30 day or 90 day supply? 100 day  Pt is out of medication pharmacy state they didn't receive previous script for 100 day supply that was sent back on 07/2024 "

## 2024-10-27 ENCOUNTER — Ambulatory Visit

## 2024-10-27 DIAGNOSIS — I442 Atrioventricular block, complete: Secondary | ICD-10-CM

## 2024-10-27 LAB — CUP PACEART REMOTE DEVICE CHECK
Battery Remaining Longevity: 112 mo
Battery Voltage: 3 V
Brady Statistic RV Percent Paced: 99.69 %
Date Time Interrogation Session: 20260125223910
Implantable Lead Connection Status: 753985
Implantable Lead Implant Date: 20211101
Implantable Lead Location: 753860
Implantable Lead Model: 3830
Implantable Pulse Generator Implant Date: 20211101
Lead Channel Impedance Value: 380 Ohm
Lead Channel Impedance Value: 456 Ohm
Lead Channel Pacing Threshold Amplitude: 0.75 V
Lead Channel Pacing Threshold Pulse Width: 0.4 ms
Lead Channel Sensing Intrinsic Amplitude: 11.5 mV
Lead Channel Sensing Intrinsic Amplitude: 11.5 mV
Lead Channel Setting Pacing Amplitude: 2 V
Lead Channel Setting Pacing Pulse Width: 0.4 ms
Lead Channel Setting Sensing Sensitivity: 0.9 mV
Zone Setting Status: 755011

## 2024-10-28 ENCOUNTER — Ambulatory Visit: Payer: Self-pay | Admitting: Cardiology

## 2024-10-28 ENCOUNTER — Ambulatory Visit: Admitting: Podiatry

## 2024-10-28 ENCOUNTER — Encounter: Payer: Self-pay | Admitting: Podiatry

## 2024-10-28 DIAGNOSIS — E11621 Type 2 diabetes mellitus with foot ulcer: Secondary | ICD-10-CM | POA: Diagnosis not present

## 2024-10-28 DIAGNOSIS — M79674 Pain in right toe(s): Secondary | ICD-10-CM | POA: Diagnosis not present

## 2024-10-28 DIAGNOSIS — L97511 Non-pressure chronic ulcer of other part of right foot limited to breakdown of skin: Secondary | ICD-10-CM

## 2024-10-28 DIAGNOSIS — E1151 Type 2 diabetes mellitus with diabetic peripheral angiopathy without gangrene: Secondary | ICD-10-CM | POA: Diagnosis not present

## 2024-10-28 DIAGNOSIS — B351 Tinea unguium: Secondary | ICD-10-CM

## 2024-10-28 DIAGNOSIS — M79675 Pain in left toe(s): Secondary | ICD-10-CM | POA: Diagnosis not present

## 2024-10-28 MED ORDER — GENTAMICIN SULFATE 0.1 % EX CREA: TOPICAL_CREAM | CUTANEOUS | 1 refills | Status: AC

## 2024-10-28 NOTE — Progress Notes (Signed)
 "  Subjective:  Patient ID: [name], female    DOB: June 10, 1936,  MRN: [id number]  [name] presents to clinic today for at risk foot care. Pt has h/o NIDDM with PAD and callus(es) left foot and painful mycotic toenails that are difficult to trim. Painful toenails interfere with ambulation. Aggravating factors include wearing enclosed shoe gear. Pain is relieved with periodic professional debridement. Painful calluses are aggravated when weightbearing with and without shoegear. Pain is relieved with periodic professional debridement.   She is accompanied by her daughter on today's visit. They came in for an urgent appointment due to pain in 5th digit interdigitally. Patient was evaluated and received a steroid injection. Pain was relieved initially, but has returned. They deny any redness, swelling or drainage from the area, but was told she has a corn. Chief Complaint  Patient presents with   [facility]    [facility] -PCP follow up 10/29/2024 patient has no concerns at this time.    PCP is Jason Leita Repine, FNP (Inactive).  Allergies[1]  Review of Systems: Negative except as noted in the HPI.  Objective: No changes noted in today's physical examination. There were no vitals filed for this visit. [name] is a pleasant 89 y.o. female WD, WN in NAD. AAO x 3.  Vascular Examination: CFT <3 seconds b/l. DP pulses faintly palpable left foot and nonpalpable right foot. PT pulses palpable b/l. Digital hair absent. No edema b/l.  Skin temperature gradient warm to warm b/l. No pain with calf compression. No ischemia or gangrene. No cyanosis or clubbing noted b/l.    Neurological Examination: Sensation grossly intact b/l with 10 gram monofilament. Vibratory sensation intact b/l.   Dermatological Examination: Preulcerative lesion medial PIPJ right 5th digit with subdermal hemorrhage. Underlying pinpoint skin ulceration, which appears to be superficial in nature. No purulence. Pink granular  base. No edema, no odor, no tracking into deep tissues.  No interdigital macerations. Toenails 1-5 b/l thick, discolored, elongated with subungual debris and pain on dorsal palpation.   Hyperkeratotic lesion(s) sub 5th met base left foot.  No erythema, no edema, no drainage, no fluctuance.  Musculoskeletal Examination: Muscle strength 5/5 to all lower extremity muscle groups bilaterally. HAV with bunion bilaterally and hammertoes 2-5 b/l.  Radiographs: None  Assessment/Plan: 1. Pain due to onychomycosis of toenails of both feet   2. Diabetic ulcer of toe of right foot associated with type 2 diabetes mellitus, limited to breakdown of skin (HCC)   3. Type II diabetes mellitus with peripheral circulatory disorder (HCC)     Meds ordered this encounter  Medications   gentamicin  cream (GARAMYCIN ) 0.1 %    Sig: Apply to affected toe once daily.    Dispense:  30 g    Refill:  1    [name]  PAD ABI Plan: -Patient was evaluated and treated and all questions answered.  -Patient/POA/Family member educated on diagnosis and treatment plan of routine ulcer debridement/wound care.  -Ulceration debridement achieved utilizing sharp excisional debridement with sterile currette.. Type/amount of devitalized tissue removed: nonviable hyperkeratosis -Today's ulcer size post-debridement: 0.1 x 0.1 x 0.05 cm. -Ulceration cleansed with wound cleanser. Triple antibiotic ointment applied to base of ulceration and secured with light dressing. -Wound responded well to today's debridement. -Patient risk factors affecting healing of ulcer: diabetes, PAD, h/o MI, CKD, dyslipidemia -Prescription written for Gentamicin  Cream. Patient is to apply to right fifth digit once daily and cover with dressing.  Patient to change dressing after shower.  Follow up with Dr. Silva in two weeks, sooner if condition worsens.  -Due to risk factor(s), ordered noninvasive arterial studies ABIs with and without TBIs for b/l lower  extremities. -Patient/POA to call should there be question/concern in the interim.  Return in about 2 weeks (around 11/11/2024).  Delon LITTIE Merlin, DPM      [place] LOCATION: [address] [place], [place] [address]                   Office [phone]   [place] LOCATION: [address] [place], [place] [address] Office [phone]  -Toenails 1-5 b/l were debrided in length and girth with sterile nail nippers and dremel without iatrogenic bleeding.  -Patient/POA to call should there be question/concern in the interim.   Return in about 2 weeks (around 11/11/2024).  Delon LITTIE Merlin, DPM      [place] LOCATION: [address], [place] [address]                   Office [phone]   [facility] LOCATION: [address] [place], [place] [address] Office [phone]     [1]  Allergies Allergen Reactions   Aspirin  Shortness Of Breath and Other (See Comments)    Caused asthma symptoms   Ramipril Cough   Doxycycline      Nausea   "

## 2024-10-29 ENCOUNTER — Ambulatory Visit: Admitting: Family Medicine

## 2024-10-29 ENCOUNTER — Encounter: Payer: Self-pay | Admitting: Family Medicine

## 2024-10-29 VITALS — BP 130/72 | HR 82 | Temp 97.6°F | Ht 66.0 in | Wt 167.0 lb

## 2024-10-29 DIAGNOSIS — K219 Gastro-esophageal reflux disease without esophagitis: Secondary | ICD-10-CM | POA: Diagnosis not present

## 2024-10-29 DIAGNOSIS — Z7901 Long term (current) use of anticoagulants: Secondary | ICD-10-CM | POA: Diagnosis not present

## 2024-10-29 DIAGNOSIS — M810 Age-related osteoporosis without current pathological fracture: Secondary | ICD-10-CM

## 2024-10-29 DIAGNOSIS — E559 Vitamin D deficiency, unspecified: Secondary | ICD-10-CM

## 2024-10-29 DIAGNOSIS — G44229 Chronic tension-type headache, not intractable: Secondary | ICD-10-CM | POA: Diagnosis not present

## 2024-10-29 DIAGNOSIS — I2510 Atherosclerotic heart disease of native coronary artery without angina pectoris: Secondary | ICD-10-CM | POA: Diagnosis not present

## 2024-10-29 DIAGNOSIS — E785 Hyperlipidemia, unspecified: Secondary | ICD-10-CM | POA: Diagnosis not present

## 2024-10-29 DIAGNOSIS — N184 Chronic kidney disease, stage 4 (severe): Secondary | ICD-10-CM | POA: Diagnosis not present

## 2024-10-29 DIAGNOSIS — I5042 Chronic combined systolic (congestive) and diastolic (congestive) heart failure: Secondary | ICD-10-CM | POA: Diagnosis not present

## 2024-10-29 DIAGNOSIS — I10 Essential (primary) hypertension: Secondary | ICD-10-CM | POA: Diagnosis not present

## 2024-10-29 DIAGNOSIS — Z950 Presence of cardiac pacemaker: Secondary | ICD-10-CM | POA: Diagnosis not present

## 2024-10-29 DIAGNOSIS — I442 Atrioventricular block, complete: Secondary | ICD-10-CM

## 2024-10-29 MED ORDER — BUTALBITAL-APAP-CAFFEINE 50-325-40 MG PO TABS: 1.0000 | ORAL_TABLET | Freq: Four times a day (QID) | ORAL | 0 refills | Status: AC | PRN

## 2024-10-29 NOTE — Patient Instructions (Signed)
 Thank you for trusting us  with your health care.  Please call and schedule with your endocrinologist.  Increase hydration, avoid skipping meals and get regular sleep to help improve headaches.

## 2024-10-29 NOTE — Progress Notes (Signed)
 "  New Patient Office Visit  Subjective    Patient ID: [name], female    DOB: 08-12-1936  Age: 89 y.o. MRN: [id number]  CC:  Chief Complaint  Patient presents with   Establish Care    Discussed the use of AI scribe software for clinical note transcription with the patient, who gave verbal consent to proceed.  History of Present Illness [name] is an 89 year old female with a complex medical history who presents to establish care. She is accompanied by her daughter.  Diabetes mellitus - Treated with Humalog  insulin  twice daily with meals - History of diabetic ketoacidosis, without recent severe episodes - managed by endocrinologist and is due for a visit   Cardiovascular disease - History of myocardial infarction, coronary artery disease, and heart failure - she has a pacemaker  - Atrial fibrillation with intermittent irregular heart rhythm - Chronic kidney disease stage four - Hyperlipidemia - Takes Eliquis  and Plavix   Osteoporosis - Discontinued Prolia  due to jaw swelling  - Avoids oral osteoporosis medications because of acid reflux and dysphagia  Headache - Frequent headaches, sometimes daily - Pain localized over the left eye - Uses Fioricet as needed  Respiratory and gastrointestinal symptoms - Asthma - Gastroesophageal reflux disease with intermittent acid reflux and dysphagia - Followed by multiple specialists for these conditions     Outpatient Encounter Medications as of 10/29/2024  Medication Sig   acetaminophen  (TYLENOL ) 325 MG tablet Take 650 mg by mouth every 6 (six) hours as needed for pain.   albuterol  (PROAIR  HFA) 108 (90 Base) MCG/ACT inhaler 2 puffs every 4 hours as needed only  if your can't catch your breath   albuterol  (PROVENTIL ) (2.5 MG/3ML) 0.083% nebulizer solution Take 3 mLs (2.5 mg total) by nebulization every 6 (six) hours as needed for wheezing or shortness of breath.   apixaban  (ELIQUIS ) 2.5 MG TABS tablet TAKE 1 TABLET BY MOUTH  TWICE A DAY   Ascorbic Acid (VITAMIN C) 1000 MG tablet Take 1,000 mg by mouth daily.   atorvastatin  (LIPITOR ) 80 MG tablet TAKE 1 TABLET BY MOUTH EVERY DAY   Azelastine  HCl 137 MCG/SPRAY SOLN Place 2 sprays into the nose 2 (two) times daily as needed (congestion, drainage, runny nose, sneezing).   BD VEO INSULIN  SYRINGE U/F 31G X 15/64 0.5 ML MISC Use as instructed   Blood Glucose Monitoring Suppl (ONETOUCH VERIO FLEX SYSTEM) w/Device KIT USE AS DIRECTED   budesonide -formoterol  (SYMBICORT ) 160-4.5 MCG/ACT inhaler TAKE 2 PUFFS FIRST THING IN MORNING AND THEN ANOTHER 2 PUFFS ABOUT 12 HOURS LATER.   carvedilol  (COREG ) 3.125 MG tablet TAKE 1 TABLET BY MOUTH TWICE A DAY WITH FOOD   cetirizine  (ZYRTEC  ALLERGY ) 10 MG tablet Take 1 tablet (10 mg total) by mouth daily as needed for allergies or rhinitis.   cholecalciferol (VITAMIN D3) 25 MCG (1000 UNIT) tablet Take 1,000 Units by mouth daily. Isn't taking regularly   clopidogrel  (PLAVIX ) 75 MG tablet Take 1 tablet (75 mg total) by mouth daily with breakfast.   diclofenac  Sodium (VOLTAREN ) 1 % GEL Apply 4 g topically 4 (four) times daily as needed (pain).   famotidine  (PEPCID ) 20 MG tablet TAKE 1 TABLET BY MOUTH TWICE A DAY   FARXIGA  5 MG TABS tablet TAKE 1 TABLET BY MOUTH EVERY DAY BEFORE BREAKFAST   fluticasone  (FLONASE ) 50 MCG/ACT nasal spray Place 2 sprays into both nostrils daily. (Patient taking differently: Place 2 sprays into both nostrils daily as needed.)   furosemide  (  LASIX ) 40 MG tablet TAKE 1 TABLET BY MOUTH TWICE A DAY   gentamicin  cream (GARAMYCIN ) 0.1 % Apply to affected toe once daily.   glucose blood (ONETOUCH VERIO) test strip 1 each by Other route 2 (two) times daily. And lancets 2/day   glucose blood test strip Check blood sugar twice a day   hydrALAZINE  (APRESOLINE ) 25 MG tablet TAKE 3 TABLETS (75 MG TOTAL) BY MOUTH 3 TIMES A DAY   insulin  lispro (HUMALOG ) 100 UNIT/ML injection GIVE 3 UNITS WITH BREAKFAST, AND 18 UNITS WITH SUPPER    latanoprost  (XALATAN ) 0.005 % ophthalmic solution Place 1 drop into both eyes at bedtime.   lidocaine  (LIDODERM ) 5 % Place 1 patch onto the skin daily. Remove & Discard patch within 12 hours or as directed by MD   methocarbamol (ROBAXIN) 500 MG tablet Take 500 mg by mouth 4 (four) times daily.   montelukast  (SINGULAIR ) 10 MG tablet Take 1 tablet (10 mg total) by mouth at bedtime.   nitroGLYCERIN  (NITROSTAT ) 0.4 MG SL tablet Place 1 tablet (0.4 mg total) under the tongue every 5 (five) minutes x 3 doses as needed for chest pain.   ondansetron  (ZOFRAN ) 8 MG tablet TAKE 1 TABLET BY MOUTH EVERY 8 HOURS AS NEEDED FOR NAUSEA OR VOMITING.   pantoprazole  (PROTONIX ) 40 MG tablet TAKE 1 TABLET BY MOUTH EVERY DAY   Plecanatide  (TRULANCE ) 3 MG TABS Take 1 tablet (3 mg total) by mouth daily.   potassium chloride  (KLOR-CON ) 10 MEQ tablet TAKE 1 TABLET BY MOUTH EVERY DAY   sucralfate  (CARAFATE ) 1 g tablet Take 1 tablet (1 g total) by mouth 3 (three) times daily as needed for up to 60 doses.   Syringe/Needle, Disp, (SYRINGE 3CC/25GX1) 25G X 1 3 ML MISC Use to inject into the skin 2x a day   tiZANidine  (ZANAFLEX ) 2 MG tablet Take 1 tablet (2 mg total) by mouth every 8 (eight) hours as needed for muscle spasms.   traZODone  (DESYREL ) 50 MG tablet TAKE 0.5 TABLETS BY MOUTH AT BEDTIME AS NEEDED FOR SLEEP.   vitamin B-12 (CYANOCOBALAMIN ) 100 MCG tablet Take 100 mcg by mouth daily.   [DISCONTINUED] butalbital -acetaminophen -caffeine  (FIORICET) 50-325-40 MG tablet Take 1 tablet by mouth every 6 (six) hours as needed for headache.   butalbital -acetaminophen -caffeine  (FIORICET) 50-325-40 MG tablet Take 1 tablet by mouth every 6 (six) hours as needed for headache.   Glucagon  (BAQSIMI  ONE PACK) 3 MG/DOSE POWD Place into the nose. (Patient not taking: Reported on 10/29/2024)   [DISCONTINUED] amoxicillin -clavulanate (AUGMENTIN ) 875-125 MG tablet Take 1 tablet by mouth 2 (two) times daily. (Patient not taking: Reported on  10/29/2024)   [DISCONTINUED] methylPREDNISolone  (MEDROL  DOSEPAK) 4 MG TBPK tablet Take with signs of chronic sinusitis and take as directed   [DISCONTINUED] tiZANidine  (ZANAFLEX ) 2 MG tablet Take 1 tablet (2 mg total) by mouth every 8 (eight) hours as needed for muscle spasms.   Facility-Administered Encounter Medications as of 10/29/2024  Medication   denosumab  (PROLIA ) injection 60 mg    Past Medical History:  Diagnosis Date   Allergic rhinitis    Anterior chest wall pain    Anxiety    Asthma    Chronic diastolic CHF (congestive heart failure) (HCC)    Echo 01/2020: EF 60-65, normal wall motion, mild LVH, normal RV SF, RVSP 52.6 (moderate elevation), severe LAE, moderate RAE, trivial MR, mild MS (mean gradient 5.5 mmHg), mild aortic valve sclerosis (no AS); elevated E/e' c/w elevated LVEDP   Cough    DM  type 2 (diabetes mellitus, type 2) (HCC)    GERD (gastroesophageal reflux disease)    History of diverticulitis of colon    HTN (hypertension)    Hyperlipidemia    IBS (irritable bowel syndrome)    Iron deficiency anemia    Mitral stenosis 04/17/2022   Echo 04/2022: EF 55-60, no RWMA, mild LVH, normal RVSF, normal PASP, moderate LAE, moderate mitral stenosis (mean gradient 4 mmHg), trivial MR, trivial AI, AV sclerosis without stenosis   Osteoporosis, unspecified    Renal insufficiency 07/31/2017   UTI (urinary tract infection)     Past Surgical History:  Procedure Laterality Date   CARDIOVASCULAR STRESS TEST  02/25/04   CORONARY BALLOON ANGIOPLASTY N/A 12/28/2021   Procedure: CORONARY BALLOON ANGIOPLASTY;  Surgeon: Verlin Lonni BIRCH, MD;  Location: [facility] INVASIVE CV LAB;  Service: Cardiovascular;  Laterality: N/A;   CORONARY STENT INTERVENTION N/A 12/28/2021   Procedure: CORONARY STENT INTERVENTION;  Surgeon: Verlin Lonni BIRCH, MD;  Location: [facility];  Service: Cardiovascular;  Laterality: N/A;   ESOPHAGOGASTRODUODENOSCOPY  12/26/01   PACEMAKER IMPLANT N/A  08/02/2020   Procedure: PACEMAKER IMPLANT;  Surgeon: Fernande Elspeth BROCKS, MD;  Location: [facility] [place] INVASIVE CV LAB;  Service: Cardiovascular;  Laterality: N/A;   RIGHT OOPHORECTOMY  jan 2010   RIGHT/LEFT HEART CATH AND CORONARY ANGIOGRAPHY N/A 12/28/2021   Procedure: RIGHT/LEFT HEART CATH AND CORONARY ANGIOGRAPHY;  Surgeon: Verlin Lonni BIRCH, MD;  Location: [facility]VASIVE CV LAB;  Service: Cardiovascular;  Laterality: N/A;    Family History  Problem Relation Age of Onset   Hypertension Mother    Stroke Mother    Other Father        poor circulation   Cancer Sister    Lung cancer Brother    Melanoma Brother    Diabetes Daughter    Colon cancer Neg Hx    Liver disease Neg Hx    Esophageal cancer Neg Hx     Social History   Socioeconomic History   Marital status: Widowed    Spouse name: Not on file   Number of children: 5   Years of education: Not on file   Highest education level: Not on file  Occupational History   Occupation: retired  Tobacco Use   Smoking status: Former    Current packs/day: 0.00    Average packs/day: 0.3 packs/day for 5.0 years (1.5 ttl pk-yrs)    Types: Cigarettes    Start date: 10/02/1981    Quit date: 10/02/1986    Years since quitting: 38.1    Passive exposure: Never   Smokeless tobacco: Never  Vaping Use   Vaping status: Never Used  Substance and Sexual Activity   Alcohol use: No   Drug use: No   Sexual activity: Not Currently  Other Topics Concern   Not on file  Social History Narrative   Not on file   Social Drivers of Health   Tobacco Use: Medium Risk (10/29/2024)   Patient History    Smoking Tobacco Use: Former    Smokeless Tobacco Use: Never    Passive Exposure: Never  Physicist, Medical Strain: Low Risk (06/13/2023)   Overall Financial Resource Strain (CARDIA)    Difficulty of Paying Living Expenses: Not hard at all  Food Insecurity: No Food Insecurity (06/13/2023)   Hunger Vital Sign    Worried About Running Out of Food in the Last Year:  Never true    Ran Out of Food in the Last Year: Never true  Transportation Needs:  No Transportation Needs (06/13/2023)   PRAPARE - Administrator, Civil Service (Medical): No    Lack of Transportation (Non-Medical): No  Physical Activity: Inactive (06/13/2023)   Exercise Vital Sign    Days of Exercise per Week: 0 days    Minutes of Exercise per Session: 0 min  Stress: No Stress Concern Present (06/13/2023)   [employer] of Occupational Health - Occupational Stress Questionnaire    Feeling of Stress : Not at all  Social Connections: Moderately Integrated (06/13/2023)   Social Connection and Isolation Panel    Frequency of Communication with Friends and Family: More than three times a week    Frequency of Social Gatherings with Friends and Family: More than three times a week    Attends Religious Services: More than 4 times per year    Active Member of [employer] or Organizations: Yes    Attends Banker Meetings: 1 to 4 times per year    Marital Status: Widowed  Intimate Partner Violence: Not At Risk (06/13/2023)   Humiliation, Afraid, Rape, and Kick questionnaire    Fear of Current or Ex-Partner: No    Emotionally Abused: No    Physically Abused: No    Sexually Abused: No  Depression (PHQ2-9): Low Risk (10/29/2024)   Depression (PHQ2-9)    PHQ-2 Score: 0  Alcohol Screen: Low Risk (06/13/2023)   Alcohol Screen    Last Alcohol Screening Score (AUDIT): 0  Housing: Low Risk (06/13/2023)   Housing    Last Housing Risk Score: 0  Utilities: Not At Risk (06/13/2023)   AHC Utilities    Threatened with loss of utilities: No  Health Literacy: Adequate Health Literacy (06/13/2023)   B1300 Health Literacy    Frequency of need for help with medical instructions: Never    Review of Systems  Constitutional:  Negative for chills, fever and malaise/fatigue.  Respiratory:  Negative for shortness of breath.   Cardiovascular:  Negative for chest pain, palpitations and leg  swelling.  Gastrointestinal:  Negative for abdominal pain, constipation, diarrhea, nausea and vomiting.  Genitourinary:  Negative for dysuria, frequency and urgency.  Neurological:  Positive for headaches. Negative for dizziness and focal weakness.        Objective    BP 130/72   Pulse 82   Temp 97.6 F (36.4 C) (Temporal)   Ht 5' 6 (1.676 m)   Wt 167 lb (75.8 kg)   SpO2 96%   BMI 26.95 kg/m   Physical Exam Constitutional:      General: She is not in acute distress.    Appearance: She is not ill-appearing.  HENT:     Mouth/Throat:     Mouth: Mucous membranes are moist.     Pharynx: Oropharynx is clear.  Eyes:     Extraocular Movements: Extraocular movements intact.     Conjunctiva/sclera: Conjunctivae normal.  Cardiovascular:     Rate and Rhythm: Normal rate and regular rhythm.  Pulmonary:     Effort: Pulmonary effort is normal.  Musculoskeletal:     Cervical back: Normal range of motion and neck supple.     Right lower leg: No edema.     Left lower leg: No edema.  Skin:    General: Skin is warm and dry.  Neurological:     General: No focal deficit present.     Mental Status: She is alert and oriented to person, place, and time.     Motor: No weakness.     Coordination:  Coordination normal.     Gait: Gait normal.  Psychiatric:        Mood and Affect: Mood normal.        Behavior: Behavior normal.        Thought Content: Thought content normal.         Assessment & Plan:   Problem List Items Addressed This Visit     Age-related osteoporosis without current pathological fracture   Relevant Orders   AMB Referral VBCI Care Management   CAD (coronary artery disease)   Chronic combined systolic (congestive) and diastolic (congestive) heart failure (HCC)   CKD (chronic kidney disease) stage 4, GFR 15-29 ml/min (HCC) - Primary   Complete heart block (HCC)   Dyslipidemia   Essential hypertension   GERD   Pacemaker - MDT   Vitamin D  deficiency   Other  Visit Diagnoses       Chronic tension-type headache, not intractable       Relevant Medications   butalbital -acetaminophen -caffeine  (FIORICET) 50-325-40 MG tablet     Long term current use of anticoagulant therapy           Assessment and Plan Assessment & Plan Type 2 diabetes mellitus with stage 4 chronic kidney disease Long-standing type 2 diabetes mellitus complicated by stage 4 chronic kidney disease. Currently managed with Humalog  insulin  twice daily. Potential need for oral medication adjustment due to kidney function. No recent diabetic ketoacidosis. Communication issues with current endocrinologist. - Schedule appointment with endocrinologist Dr. Dartha. - Monitor kidney function and adjust diabetes medications as needed.  Age-related osteoporosis Osteoporosis with previous adverse reaction to Prolia  injection. No current bone treatment due to concerns about medication side effects and potential jaw swelling. Discussed risks of untreated osteoporosis, including fractures. Consideration of alternative treatments due to previous adverse reaction to Prolia . - Consulted with pharmacist Cristy for alternative osteoporosis treatment options.   Chronic tension-type headache Chronic tension-type headaches occurring daily or every other day, often associated with dehydration, irregular sleep, and skipped meals. Previously managed with Fioricet and requests refill.  Discussed importance of hydration and regular sleep to prevent headaches. - Refilled Fioricet for severe headaches. - Encouraged hydration, regular sleep, and regular meals to prevent headaches.  Atrial fibrillation Intermittent atrial fibrillation managed with Eliquis .  Currently appears to be in normal sinus rhythm.  Chronic combined systolic and diastolic heart failure Chronic heart failure with systolic and diastolic dysfunction.  Atherosclerotic heart disease of native coronary artery Atherosclerotic heart disease  managed with Plavix .  General Health Maintenance Routine health maintenance discussed, including the importance of regular follow-ups and specialist coordination. - Will schedule follow-up appointment in 6-8 weeks to monitor overall health. - Will coordinate with specialists for ongoing care.     Return in about 6 weeks (around 12/10/2024) for chronic health conditions.   Boby Mackintosh, NP-C   "

## 2024-10-30 ENCOUNTER — Telehealth: Payer: Self-pay

## 2024-10-30 NOTE — Progress Notes (Signed)
 Care Guide Pharmacy Note  10/30/2024 Name: VAUNDA GUTTERMAN MRN: [id number] DOB: [birth year]  Referred By: Lendia Boby LITTIE, NP-C Reason for referral: Call Attempt #1 and Complex Care Management (Outreach to sch ref w/ pharm)   NOBLE BODIE is a 89 y.o. year old female who is a primary care patient of Lendia, Vickie L, NP-C.  Ronal LITTIE Lodge was referred to the pharmacist for assistance related to: DMII  Successful contact was made with the patient to discuss pharmacy services including being ready for the pharmacist to call at least 5 minutes before the scheduled appointment time and to have medication bottles and any blood pressure readings ready for review. The patient agreed to meet with the pharmacist via telephone visit on 11/11/2024.  Doyce Razor [facility], [facility] Guide Direct Dial: [phone]  Fax: [phone]

## 2024-10-31 NOTE — Progress Notes (Signed)
 Remote PPM Transmission

## 2024-11-06 ENCOUNTER — Ambulatory Visit: Admitting: Podiatry

## 2024-11-06 ENCOUNTER — Other Ambulatory Visit: Payer: Self-pay | Admitting: Family Medicine

## 2024-11-06 VITALS — Ht 66.0 in | Wt 167.0 lb

## 2024-11-06 DIAGNOSIS — Z1231 Encounter for screening mammogram for malignant neoplasm of breast: Secondary | ICD-10-CM

## 2024-11-06 DIAGNOSIS — E11621 Type 2 diabetes mellitus with foot ulcer: Secondary | ICD-10-CM

## 2024-11-06 NOTE — Progress Notes (Signed)
"  °  Subjective:  Patient ID: [name], female    DOB: [birth year],  MRN: [id number]  Chief Complaint  Patient presents with   Foot Ulcer    RM 10 2 weeks with [name] for ulcer right 5th toe interdigitally per [name]. Wound with a small opeing on the medial side of the right 5th toe with minor drainage. Pt states changing bandage daily and applying prescribed ointment.    89 y.o. female presents with the above complaint. History confirmed with patient.  She is referred in by [name].  There is a small ulcer on the right medial fifth toe they have been managing with gentamicin  ointment daily.  They have not heard about scheduling her ABI circulation testing  Objective:  Physical Exam: Pulses are nonpalpable capillary fill time is not normal, foot is cool to warm gradient from toes to ankle, fortunately the skin ulceration only a few millimeter in diameter on the medial PIPJ of the fifth toe on the right foot she has multiple digital hammertoe contractures that are rigid in nature.  Mild periwound erythema but no edema purulence or malodor    A1c 7.4%   Assessment:     ICD-10-CM   1. Diabetic ulcer of toe of right foot associated with type 2 diabetes mellitus, limited to breakdown of skin [facility])  E11.621    L97.511         Plan:  Patient was evaluated and treated and all questions answered.  Ulcer appears to be healing well and free of acute signs of infection, I discussed with patient and her daughter that agree with the ABIs to be completed and the number and address was provided for them for scheduling.  Continue using gentamicin  ointment and a bandage daily.  Currently no acute signs of infection but they will notify me if this develop.  Return in 4 weeks to reevaluate or sooner if issues.  Return in about 4 weeks (around 12/04/2024) for wound care.   "

## 2024-11-06 NOTE — Patient Instructions (Signed)
 Call to schedule your vascular testing:   [facility] Jeralene Mom. [facility] [address] [place],  [place]  [address]   Main: [phone]

## 2024-11-07 ENCOUNTER — Other Ambulatory Visit: Payer: Self-pay | Admitting: "Endocrinology

## 2024-11-10 ENCOUNTER — Ambulatory Visit (HOSPITAL_COMMUNITY): Admission: RE | Admit: 2024-11-10 | Source: Ambulatory Visit

## 2024-11-11 ENCOUNTER — Other Ambulatory Visit

## 2024-11-12 ENCOUNTER — Ambulatory Visit

## 2024-11-13 ENCOUNTER — Ambulatory Visit: Admitting: Podiatry

## 2024-11-19 ENCOUNTER — Ambulatory Visit: Admitting: "Endocrinology

## 2024-11-20 ENCOUNTER — Ambulatory Visit: Admitting: Internal Medicine

## 2024-11-27 ENCOUNTER — Ambulatory Visit: Admitting: "Endocrinology

## 2024-12-02 ENCOUNTER — Other Ambulatory Visit

## 2024-12-02 ENCOUNTER — Other Ambulatory Visit: Admitting: Obstetrics and Gynecology

## 2024-12-09 ENCOUNTER — Ambulatory Visit: Admitting: Podiatry

## 2024-12-11 ENCOUNTER — Ambulatory Visit: Admitting: Family Medicine

## 2024-12-26 ENCOUNTER — Ambulatory Visit (INDEPENDENT_AMBULATORY_CARE_PROVIDER_SITE_OTHER): Admitting: Otolaryngology

## 2025-01-14 ENCOUNTER — Ambulatory Visit: Admitting: Internal Medicine

## 2025-01-26 ENCOUNTER — Encounter

## 2025-01-28 ENCOUNTER — Ambulatory Visit: Admitting: Podiatry

## 2025-02-11 ENCOUNTER — Ambulatory Visit: Admitting: Podiatry

## 2025-09-16 ENCOUNTER — Ambulatory Visit: Admitting: Allergy
